# Patient Record
Sex: Female | Born: 1940 | Race: Black or African American | Hispanic: No | Marital: Married | Smoking: Never smoker
Health system: Southern US, Community
[De-identification: ages and names within clinical notes are randomized; demographics above are authoritative.]

## PROBLEM LIST (undated history)

## (undated) DIAGNOSIS — Z9221 Personal history of antineoplastic chemotherapy: Secondary | ICD-10-CM

## (undated) DIAGNOSIS — R51 Headache: Secondary | ICD-10-CM

## (undated) DIAGNOSIS — I5020 Unspecified systolic (congestive) heart failure: Secondary | ICD-10-CM

## (undated) DIAGNOSIS — R591 Generalized enlarged lymph nodes: Secondary | ICD-10-CM

## (undated) DIAGNOSIS — K219 Gastro-esophageal reflux disease without esophagitis: Secondary | ICD-10-CM

## (undated) DIAGNOSIS — M069 Rheumatoid arthritis, unspecified: Secondary | ICD-10-CM

## (undated) DIAGNOSIS — M359 Systemic involvement of connective tissue, unspecified: Secondary | ICD-10-CM

## (undated) DIAGNOSIS — M1990 Unspecified osteoarthritis, unspecified site: Secondary | ICD-10-CM

## (undated) DIAGNOSIS — E785 Hyperlipidemia, unspecified: Secondary | ICD-10-CM

## (undated) DIAGNOSIS — C8310 Mantle cell lymphoma, unspecified site: Secondary | ICD-10-CM

## (undated) HISTORY — DX: Rheumatoid arthritis, unspecified: M06.9

## (undated) HISTORY — DX: Unspecified systolic (congestive) heart failure: I50.20

## (undated) HISTORY — DX: Headache: R51

## (undated) HISTORY — DX: Gastro-esophageal reflux disease without esophagitis: K21.9

## (undated) HISTORY — DX: Personal history of antineoplastic chemotherapy: Z92.21

## (undated) HISTORY — DX: Hyperlipidemia, unspecified: E78.5

## (undated) HISTORY — DX: Unspecified osteoarthritis, unspecified site: M19.90

## (undated) HISTORY — DX: Mantle cell lymphoma, unspecified site: C83.10

## (undated) HISTORY — DX: Generalized enlarged lymph nodes: R59.1

---

## 2004-08-31 HISTORY — PX: CARDIAC CATHETERIZATION: SHX172

## 2004-09-05 ENCOUNTER — Ambulatory Visit: Payer: Self-pay | Admitting: Cardiology

## 2004-09-30 HISTORY — PX: CARDIAC CATHETERIZATION: SHX172

## 2004-10-10 ENCOUNTER — Ambulatory Visit: Payer: Self-pay

## 2004-10-24 ENCOUNTER — Ambulatory Visit: Payer: Self-pay

## 2004-12-03 ENCOUNTER — Ambulatory Visit: Payer: Self-pay

## 2007-11-17 ENCOUNTER — Ambulatory Visit: Payer: Self-pay

## 2008-12-27 ENCOUNTER — Ambulatory Visit: Payer: Self-pay | Admitting: Internal Medicine

## 2009-02-28 ENCOUNTER — Ambulatory Visit: Payer: Self-pay | Admitting: Gastroenterology

## 2009-12-31 ENCOUNTER — Ambulatory Visit: Payer: Self-pay | Admitting: Internal Medicine

## 2010-11-13 LAB — HM COLONOSCOPY: HM Colonoscopy: NORMAL

## 2011-06-02 ENCOUNTER — Other Ambulatory Visit: Payer: Self-pay | Admitting: *Deleted

## 2011-06-02 MED ORDER — FOLIC ACID 1 MG PO TABS: 1.0000 mg | ORAL_TABLET | Freq: Every day | ORAL | Status: AC

## 2011-06-02 NOTE — Telephone Encounter (Signed)
Faxed refill request from [facility] [address], last filled 04/15/11.

## 2011-06-02 NOTE — Telephone Encounter (Signed)
Folic acid called to [facility].

## 2011-08-07 ENCOUNTER — Telehealth: Payer: Self-pay | Admitting: *Deleted

## 2011-08-07 NOTE — Telephone Encounter (Signed)
Pharm faxed RF request for nystatin powder 60 grams use PRN. OK to fill? Last RF was from Dr Darrick Huntsman and this has Dr Tilman Neat name on it... Who's patient is this?

## 2011-08-07 NOTE — Telephone Encounter (Signed)
My patient. Fine to fill.

## 2011-08-08 MED ORDER — NYSTATIN 100000 UNIT/GM EX POWD: Freq: Four times a day (QID) | CUTANEOUS | Status: DC

## 2011-08-08 NOTE — Telephone Encounter (Signed)
Addended by: Vernie Murders on: 08/08/2011 06:00 PM   Modules accepted: Orders

## 2011-08-15 ENCOUNTER — Other Ambulatory Visit: Payer: Self-pay | Admitting: *Deleted

## 2011-08-15 MED ORDER — NYSTATIN 100000 UNIT/GM EX POWD: Freq: Four times a day (QID) | CUTANEOUS | Status: AC

## 2011-09-18 ENCOUNTER — Ambulatory Visit (INDEPENDENT_AMBULATORY_CARE_PROVIDER_SITE_OTHER): Payer: Medicare Other | Admitting: Internal Medicine

## 2011-09-18 ENCOUNTER — Encounter: Payer: Self-pay | Admitting: Internal Medicine

## 2011-09-18 VITALS — BP 110/60 | HR 74 | Temp 98.3°F | Ht 65.75 in | Wt 256.8 lb

## 2011-09-18 DIAGNOSIS — M069 Rheumatoid arthritis, unspecified: Secondary | ICD-10-CM | POA: Insufficient documentation

## 2011-09-18 DIAGNOSIS — H6690 Otitis media, unspecified, unspecified ear: Secondary | ICD-10-CM

## 2011-09-18 DIAGNOSIS — K219 Gastro-esophageal reflux disease without esophagitis: Secondary | ICD-10-CM | POA: Insufficient documentation

## 2011-09-18 DIAGNOSIS — H6692 Otitis media, unspecified, left ear: Secondary | ICD-10-CM | POA: Insufficient documentation

## 2011-09-18 DIAGNOSIS — M810 Age-related osteoporosis without current pathological fracture: Secondary | ICD-10-CM | POA: Insufficient documentation

## 2011-09-18 MED ORDER — CALTRATE 600+D PLUS MINERALS 600-800 MG-UNIT PO CHEW: 1.0000 | CHEWABLE_TABLET | Freq: Two times a day (BID) | ORAL | Status: DC

## 2011-09-18 MED ORDER — AMOXICILLIN-POT CLAVULANATE 875-125 MG PO TABS: 1.0000 | ORAL_TABLET | Freq: Two times a day (BID) | ORAL | Status: AC

## 2011-09-18 NOTE — Assessment & Plan Note (Signed)
Symptoms of dizziness and exam findings of purulent middle ear effusion on the left are most consistent with otitis media. Will treat with Augmentin. Patient will hold her dose of methotrexate while on Augmentin. If no improvement, will set up with ENT for further evaluation.

## 2011-09-18 NOTE — Assessment & Plan Note (Signed)
Symptoms well controlled with use of methotrexate and prednisone. Encouraged her to try to limit use of meloxicam because of ongoing epigastric discomfort. Will get recent notes and labs from her rheumatologist.

## 2011-09-18 NOTE — Patient Instructions (Signed)
Stop Methotrexate x 1 week.  Stop Meloxicam.  Start Augmentin.  Follow up 3 weeks.

## 2011-09-18 NOTE — Assessment & Plan Note (Signed)
Symptoms worsened with the use of prednisone and meloxicam. Encouraged her to use prednisone alone. Will change to omeprazole as she had better luck with this medication the past. If her symptoms persist she will call and we will set up GI evaluation to include endoscopy.

## 2011-09-18 NOTE — Progress Notes (Signed)
Subjective:    Patient ID: Michelle [name], female    DOB: 07/06/1940, 71 y.o.   MRN: [id number]  HPI 71 year old female with history of rheumatoid arthritis presents for followup. She reports that her joint pain has been fairly well controlled with the use of methotrexate. She is also taking prednisone. With the use of these medications, she notes some persistent epigastric discomfort. She has tried taking Dexilant with no improvement. She would like to consider changing back to Prilosec as she had more relief with this medication. She denies any nausea or vomiting. She denies any black stool or blood in her stool. She denies any constipation or diarrhea.  She is concerned today about intermittent episodes of dizziness. The dizziness is described as vertigo. Has been occurring over the last several months. It is precipitated by moving her head to the left. It occurs when she is lying in bed with her head placed towards her left ear. It resolves with lying on her right side. She also notes occasional discomfort in her left ear. She also has some nasal congestion. She denies any fever or chills.  Outpatient Encounter Prescriptions as of 09/18/2011  Medication Sig Dispense Refill  . Calcium Carbonate-Vitamin D (CALTRATE 600+D PO) Take 1 tablet by mouth daily.      [name] dexlansoprazole (DEXILANT) 60 MG capsule Take 60 mg by mouth daily.      . folic acid (FOLVITE) 1 MG tablet Take 1 tablet (1 mg total) by mouth daily.  30 tablet  5  . meloxicam (MOBIC) 15 MG tablet Take 15 mg by mouth every morning.      . Methotrexate Sodium, PF, 50 MG/2ML SOLN Inject 25 mg as directed once a week.      . NON FORMULARY Herbalife-take one tablet every morning and every evening      . nystatin (MYCOSTATIN) powder Apply topically 4 (four) times daily.  60 g  1  . omeprazole (PRILOSEC) 40 MG capsule Take 40 mg by mouth every morning.      . predniSONE (DELTASONE) 10 MG tablet Take 10 mg by mouth every morning.      [name]  amoxicillin-clavulanate (AUGMENTIN) 875-125 MG per tablet Take 1 tablet by mouth 2 (two) times daily.  20 tablet  0  . Calcium Carbonate-Vit D-Min (CALTRATE 600+D PLUS) 600-800 MG-UNIT CHEW Chew 1 tablet by mouth 2 (two) times daily.  60 tablet  6    Review of Systems  Constitutional: Negative for fever, chills, appetite change, fatigue and unexpected weight change.  HENT: Negative for ear pain, congestion, sore throat, trouble swallowing, neck pain, voice change and sinus pressure.   Eyes: Negative for visual disturbance.  Respiratory: Negative for cough, shortness of breath, wheezing and stridor.   Cardiovascular: Negative for chest pain, palpitations and leg swelling.  Gastrointestinal: Positive for abdominal pain. Negative for nausea, vomiting, diarrhea, constipation, blood in stool, abdominal distention and anal bleeding.  Genitourinary: Negative for dysuria and flank pain.  Musculoskeletal: Positive for myalgias, joint swelling and arthralgias. Negative for gait problem.  Skin: Negative for color change and rash.  Neurological: Positive for dizziness. Negative for headaches.  Hematological: Negative for adenopathy. Does not bruise/bleed easily.  Psychiatric/Behavioral: Negative for suicidal ideas, sleep disturbance and dysphoric mood. The patient is not nervous/anxious.       BP 110/60  Pulse 74  Temp(Src) 98.3 F (36.8 C) (Oral)  Ht 5' 5.75" (1.67 m)  Wt 256 lb 12 oz (116.461 kg)  BMI 41.76 kg/m2  SpO2  98%  Objective:   Physical Exam  Constitutional: She is oriented to person, place, and time. She appears well-developed and well-nourished. No distress.  HENT:  Head: Normocephalic and atraumatic.  Right Ear: External ear normal. Tympanic membrane is not bulging. No middle ear effusion.  Left Ear: External ear normal. Tympanic membrane is erythematous and bulging. A middle ear effusion is present.  Nose: Nose normal.  Mouth/Throat: Oropharynx is clear and moist. No  oropharyngeal exudate.  Eyes: Conjunctivae are normal. Pupils are equal, round, and reactive to light. Right eye exhibits no discharge. Left eye exhibits no discharge. No scleral icterus.  Neck: Normal range of motion. Neck supple. No tracheal deviation present. No thyromegaly present.  Cardiovascular: Normal rate, regular rhythm, normal heart sounds and intact distal pulses.  Exam reveals no gallop and no friction rub.   No murmur heard. Pulmonary/Chest: Effort normal and breath sounds normal. No respiratory distress. She has no wheezes. She has no rales. She exhibits no tenderness.  Musculoskeletal: She exhibits no edema and no tenderness.       Right hand: She exhibits decreased range of motion and deformity.       Left hand: She exhibits decreased range of motion and deformity (thickened joints).  Lymphadenopathy:    She has no cervical adenopathy.  Neurological: She is alert and oriented to person, place, and time. No cranial nerve deficit. She exhibits normal muscle tone. Coordination normal.  Skin: Skin is warm and dry. No rash noted. She is not diaphoretic. No erythema. No pallor.  Psychiatric: She has a normal mood and affect. Her behavior is normal. Judgment and thought content normal.          Assessment & Plan:

## 2011-09-26 ENCOUNTER — Encounter: Payer: Self-pay | Admitting: Internal Medicine

## 2011-09-26 LAB — HM MAMMOGRAPHY: HM Mammogram: NORMAL

## 2011-10-13 ENCOUNTER — Telehealth: Payer: Self-pay | Admitting: Internal Medicine

## 2011-10-13 ENCOUNTER — Encounter: Payer: Self-pay | Admitting: Internal Medicine

## 2011-10-13 ENCOUNTER — Ambulatory Visit (INDEPENDENT_AMBULATORY_CARE_PROVIDER_SITE_OTHER): Payer: Medicare Other | Admitting: Internal Medicine

## 2011-10-13 VITALS — BP 107/74 | HR 73 | Temp 98.1°F | Resp 16 | Wt 217.5 lb

## 2011-10-13 DIAGNOSIS — F411 Generalized anxiety disorder: Secondary | ICD-10-CM

## 2011-10-13 DIAGNOSIS — R6889 Other general symptoms and signs: Secondary | ICD-10-CM

## 2011-10-13 DIAGNOSIS — F419 Anxiety disorder, unspecified: Secondary | ICD-10-CM

## 2011-10-13 DIAGNOSIS — R42 Dizziness and giddiness: Secondary | ICD-10-CM | POA: Insufficient documentation
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
## 2011-10-13 NOTE — Assessment & Plan Note (Signed)
Offered support today. Not currently interested in counseling or medication to help with symptoms. Will follow up 3 weeks.
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
## 2011-10-14 LAB — CBC WITH DIFFERENTIAL/PLATELET
Basophils Absolute: 0 10*3/uL (ref 0.0–0.1)
Basophils Relative: 0.1 % (ref 0.0–3.0)
Eosinophils Absolute: 0.1 10*3/uL (ref 0.0–0.7)
Eosinophils Relative: 1 % (ref 0.0–5.0)
HCT: 40.3 % (ref 36.0–46.0)
Hemoglobin: 13.1 g/dL (ref 12.0–15.0)
Lymphocytes Relative: 18.6 % (ref 12.0–46.0)
Lymphs Abs: 1.5 10*3/uL (ref 0.7–4.0)
MCHC: 32.5 g/dL (ref 30.0–36.0)
MCV: 91.7 fl (ref 78.0–100.0)
Monocytes Absolute: 0.2 10*3/uL (ref 0.1–1.0)
Monocytes Relative: 2.2 % — ABNORMAL LOW (ref 3.0–12.0)
Neutro Abs: 6.2 10*3/uL (ref 1.4–7.7)
Neutrophils Relative %: 78.1 % — ABNORMAL HIGH (ref 43.0–77.0)
Platelets: 239 10*3/uL (ref 150.0–400.0)
RBC: 4.39 Mil/uL (ref 3.87–5.11)
RDW: 15.6 % — ABNORMAL HIGH (ref 11.5–14.6)
WBC: 7.9 10*3/uL (ref 4.5–10.5)

## 2011-10-14 LAB — COMPREHENSIVE METABOLIC PANEL
ALT: 12 U/L (ref 0–35)
AST: 26 U/L (ref 0–37)
Albumin: 3.5 g/dL (ref 3.5–5.2)
Alkaline Phosphatase: 55 U/L (ref 39–117)
BUN: 17 mg/dL (ref 6–23)
CO2: 26 mEq/L (ref 19–32)
Calcium: 9.2 mg/dL (ref 8.4–10.5)
Chloride: 105 mEq/L (ref 96–112)
Creatinine, Ser: 0.7 mg/dL (ref 0.4–1.2)
GFR: 105.05 mL/min (ref >60.00)
Glucose, Bld: 125 mg/dL — ABNORMAL HIGH (ref 70–99)
Potassium: 3.9 mEq/L (ref 3.5–5.1)
Sodium: 140 mEq/L (ref 135–145)
Total Bilirubin: 0.3 mg/dL (ref 0.3–1.2)
Total Protein: 7.5 g/dL (ref 6.0–8.3)

## 2011-10-14 LAB — VITAMIN B12: Vitamin B-12: 656 pg/mL (ref 211–911)

## 2011-10-14 LAB — TSH: TSH: 0.67 u[IU]/mL (ref 0.35–5.50)

## 2011-10-15 ENCOUNTER — Encounter: Payer: Self-pay | Admitting: *Deleted

## 2011-10-15 DIAGNOSIS — R739 Hyperglycemia, unspecified: Secondary | ICD-10-CM | POA: Insufficient documentation

## 2011-10-15 LAB — HEMOGLOBIN A1C: Hgb A1c MFr Bld: 6 % (ref 4.6–6.5)

## 2011-10-17 ENCOUNTER — Telehealth: Payer: Self-pay | Admitting: Internal Medicine

## 2011-10-17 DIAGNOSIS — E041 Nontoxic single thyroid nodule: Secondary | ICD-10-CM

## 2011-10-17 NOTE — Telephone Encounter (Signed)
Patient notified. She would like referral to ENT

## 2011-10-17 NOTE — Telephone Encounter (Signed)
Carotid artery ultrasound looked okay, but they saw a thyroid nodule.  I would like to set her up with ENT for further evaluation of the nodule.

## 2011-10-21 NOTE — Telephone Encounter (Signed)
I have filled out my part of referral waiting for Dr. Dan Humphreys to sign.

## 2011-10-22 ENCOUNTER — Encounter: Payer: Self-pay | Admitting: Internal Medicine

## 2011-10-28 ENCOUNTER — Ambulatory Visit: Payer: Self-pay | Admitting: Otolaryngology

## 2011-10-28 NOTE — Telephone Encounter (Signed)
I have faxed order/referral.

## 2011-11-06 ENCOUNTER — Ambulatory Visit: Payer: Medicare Other | Admitting: Internal Medicine

## 2011-11-13 ENCOUNTER — Encounter: Payer: Self-pay | Admitting: Internal Medicine

## 2011-11-13 ENCOUNTER — Ambulatory Visit (INDEPENDENT_AMBULATORY_CARE_PROVIDER_SITE_OTHER): Payer: Medicare Other | Admitting: Internal Medicine

## 2011-11-13 VITALS — BP 130/80 | HR 74 | Temp 98.2°F | Ht 65.75 in | Wt 257.2 lb

## 2011-11-13 DIAGNOSIS — R42 Dizziness and giddiness: Secondary | ICD-10-CM

## 2011-11-13 DIAGNOSIS — E041 Nontoxic single thyroid nodule: Secondary | ICD-10-CM

## 2011-11-13 DIAGNOSIS — M069 Rheumatoid arthritis, unspecified: Secondary | ICD-10-CM

## 2011-11-13 DIAGNOSIS — F419 Anxiety disorder, unspecified: Secondary | ICD-10-CM

## 2011-11-13 DIAGNOSIS — F411 Generalized anxiety disorder: Secondary | ICD-10-CM

## 2011-11-13 MED ORDER — METHOTREXATE SODIUM CHEMO INJECTION (PF) 50 MG/2ML: 25.0000 mg | INTRAMUSCULAR | Status: DC

## 2011-11-13 NOTE — Assessment & Plan Note (Signed)
Symptoms improved. Will continue to monitor 

## 2011-11-13 NOTE — Assessment & Plan Note (Signed)
Patient's rheumatologist is leaving town. Will set up referral to another rheumatologist for evaluation. We'll continue methotrexate, prednisone. Followup here in 3 months.

## 2011-11-13 NOTE — Assessment & Plan Note (Addendum)
Noted on ultrasound of the carotids. Will request notes on evaluation by ENT. Patient notes a plan for a repeat ultrasound in one year.

## 2011-11-13 NOTE — Assessment & Plan Note (Addendum)
Symptoms resolved. Carotid doppler was normal. Suspect his symptoms were related to recent increased anxiety. We'll continue to monitor.

## 2011-11-13 NOTE — Progress Notes (Signed)
Subjective:    Patient ID: Michelle [name], female    DOB: 08/08/1940, 71 y.o.   MRN: [id number]  HPI 71 year old female with history of rheumatoid arthritis, osteoporosis, anxiety presents for followup after recent episodes of dizziness or lightheadedness. She reports that these symptoms have resolved. She notes that her anxiety level is much improved compared to previous. She was evaluated with carotid Dopplers which were normal. She also had evaluation for left-sided thyroid nodule that was incidentally found. She notes that she has follow up or repeat ultrasound with ENT in one year.  In regards to her rheumatoid arthritis, she notes symptoms are fairly well-controlled on current medications. She continues to intermittently have some stiffness and swelling in the joints of her hands. She notes that her rheumatologist is planning to leave the area and she will need to establish care with a new physician.  Outpatient Encounter Prescriptions as of 11/13/2011  Medication Sig Dispense Refill  . Calcium Carbonate-Vit D-Min (CALTRATE 600+D PLUS) 600-800 MG-UNIT CHEW Chew 1 tablet by mouth 2 (two) times daily.  60 tablet  6  . Calcium Carbonate-Vitamin D (CALTRATE 600+D PO) Take 1 tablet by mouth daily.      [name] dexlansoprazole (DEXILANT) 60 MG capsule Take 60 mg by mouth daily.      . folic acid (FOLVITE) 1 MG tablet Take 1 tablet (1 mg total) by mouth daily.  30 tablet  5  . meloxicam (MOBIC) 15 MG tablet Take 15 mg by mouth every morning.      . Methotrexate Sodium, PF, 50 MG/2ML SOLN Inject 25 mg as directed once a week.  40 mL  3  . NON FORMULARY Herbalife-take one tablet every morning and every evening      . nystatin (MYCOSTATIN) powder Apply topically 4 (four) times daily.  60 g  1  . omeprazole (PRILOSEC) 40 MG capsule Take 40 mg by mouth every morning.      . predniSONE (DELTASONE) 10 MG tablet Take 10 mg by mouth every morning.      [name] DISCONTD: Methotrexate Sodium, PF, 50 MG/2ML SOLN Inject  25 mg as directed once a week.        Review of Systems  Constitutional: Negative for fever, chills, appetite change, fatigue and unexpected weight change.  HENT: Negative for neck pain.   Eyes: Negative for visual disturbance.  Respiratory: Negative for cough, shortness of breath, wheezing and stridor.   Cardiovascular: Negative for chest pain, palpitations and leg swelling.  Gastrointestinal: Negative for abdominal pain.  Genitourinary: Negative for dysuria and flank pain.  Musculoskeletal: Positive for joint swelling and arthralgias. Negative for myalgias and gait problem.  Skin: Negative for color change and rash.  Neurological: Negative for dizziness and headaches.  Hematological: Negative for adenopathy. Does not bruise/bleed easily.  Psychiatric/Behavioral: Negative for suicidal ideas, disturbed wake/sleep cycle and dysphoric mood. The patient is not nervous/anxious.    BP 130/80  Pulse 74  Temp 98.2 F (36.8 C) (Oral)  Ht 5' 5.75" (1.67 m)  Wt 257 lb 4 oz (116.688 kg)  BMI 41.84 kg/m2  SpO2 98%     Objective:   Physical Exam  Constitutional: She is oriented to person, place, and time. She appears well-developed and well-nourished. No distress.  HENT:  Head: Normocephalic and atraumatic.  Right Ear: External ear normal.  Left Ear: External ear normal.  Nose: Nose normal.  Mouth/Throat: Oropharynx is clear and moist. No oropharyngeal exudate.  Eyes: Conjunctivae are normal. Pupils are equal, round, and  reactive to light. Right eye exhibits no discharge. Left eye exhibits no discharge. No scleral icterus.  Neck: Normal range of motion. Neck supple. No tracheal deviation present. No thyromegaly present.  Cardiovascular: Normal rate, regular rhythm, normal heart sounds and intact distal pulses.  Exam reveals no gallop and no friction rub.   No murmur heard. Pulmonary/Chest: Effort normal and breath sounds normal. No respiratory distress. She has no wheezes. She has no  rales. She exhibits no tenderness.  Musculoskeletal: Normal range of motion. She exhibits no edema and no tenderness.       Right hand: She exhibits swelling.       Left hand: She exhibits swelling.  Lymphadenopathy:    She has no cervical adenopathy.  Neurological: She is alert and oriented to person, place, and time. No cranial nerve deficit. She exhibits normal muscle tone. Coordination normal.  Skin: Skin is warm and dry. No rash noted. She is not diaphoretic. No erythema. No pallor.  Psychiatric: She has a normal mood and affect. Her behavior is normal. Judgment and thought content normal.          Assessment & Plan:

## 2011-12-17 ENCOUNTER — Other Ambulatory Visit: Payer: Self-pay | Admitting: *Deleted

## 2011-12-17 MED ORDER — PREDNISONE 10 MG PO TABS: 10.0000 mg | ORAL_TABLET | Freq: Every morning | ORAL | Status: DC

## 2011-12-17 NOTE — Telephone Encounter (Signed)
Patient advised via telephone that Rx was sent to pharmacy.

## 2012-02-18 ENCOUNTER — Ambulatory Visit (INDEPENDENT_AMBULATORY_CARE_PROVIDER_SITE_OTHER): Payer: Medicare Other | Admitting: Internal Medicine

## 2012-02-18 ENCOUNTER — Encounter: Payer: Self-pay | Admitting: Internal Medicine

## 2012-02-18 VITALS — BP 110/64 | HR 60 | Temp 98.5°F | Ht 65.75 in | Wt 261.8 lb

## 2012-02-18 DIAGNOSIS — F419 Anxiety disorder, unspecified: Secondary | ICD-10-CM

## 2012-02-18 DIAGNOSIS — M069 Rheumatoid arthritis, unspecified: Secondary | ICD-10-CM

## 2012-02-18 DIAGNOSIS — F411 Generalized anxiety disorder: Secondary | ICD-10-CM

## 2012-02-18 DIAGNOSIS — Z23 Encounter for immunization: Secondary | ICD-10-CM

## 2012-02-18 DIAGNOSIS — R739 Hyperglycemia, unspecified: Secondary | ICD-10-CM

## 2012-02-18 DIAGNOSIS — R7309 Other abnormal glucose: Secondary | ICD-10-CM

## 2012-02-18 MED ORDER — MELOXICAM 15 MG PO TABS: 15.0000 mg | ORAL_TABLET | Freq: Every morning | ORAL | Status: DC

## 2012-02-18 NOTE — Assessment & Plan Note (Signed)
Patient has established care with new rheumatologist. She is being evaluated for treatment with Humira. In the interim, she is continuing methotrexate and prednisone. She plans to taper prednisone to every other day with hopes of tapering off over the next few months. Followup here in 6 months.

## 2012-02-18 NOTE — Assessment & Plan Note (Signed)
Recent fasting blood sugar was elevated but hemoglobin A1c was 6% in range of prediabetes. Will continue to monitor with repeat blood sugar check in 6 months. Encouraged efforts at healthy diet and regular physical activity.

## 2012-02-18 NOTE — Assessment & Plan Note (Signed)
Anxiety is currently well-controlled. Will continue to monitor.

## 2012-02-18 NOTE — Progress Notes (Signed)
Subjective:    Patient ID: Michelle [name], female    DOB: 19-Jan-1941, 71 y.o.   MRN: [id number]  HPI 71 year old female with history of rheumatoid arthritis, obesity, anxiety presents for followup. She reports she is doing well. She establish care with a new rheumatologist and is being evaluated for treatment with Humira. She continues for now on prednisone and methotrexate. Joint pain in her hands is well controlled. She reports some ongoing anxiety related to both of her sons being incarcerated, however she feels that she is dealing with this well. She denies new concerns today. She is trying to increase her physical activity and follow a healthy diet.  Outpatient Encounter Prescriptions as of 02/18/2012  Medication Sig Dispense Refill  . Calcium Carbonate-Vit D-Min (CALTRATE 600+D PLUS) 600-800 MG-UNIT CHEW Chew 1 tablet by mouth 2 (two) times daily.  60 tablet  6  . Calcium Carbonate-Vitamin D (CALTRATE 600+D PO) Take 1 tablet by mouth daily.      . folic acid (FOLVITE) 1 MG tablet Take 1 tablet (1 mg total) by mouth daily.  30 tablet  5  . meloxicam (MOBIC) 15 MG tablet Take 1 tablet (15 mg total) by mouth every morning.  30 tablet  6  . Methotrexate Sodium, PF, 50 MG/2ML SOLN Inject 25 mg as directed once a week.  40 mL  3  . NON FORMULARY Herbalife-take one tablet every morning and every evening      . nystatin (MYCOSTATIN) powder Apply topically 4 (four) times daily.  60 g  1  . omeprazole (PRILOSEC) 40 MG capsule Take 40 mg by mouth every morning.      . predniSONE (DELTASONE) 10 MG tablet Take 1 tablet (10 mg total) by mouth every morning.  30 tablet  6  . DISCONTD: dexlansoprazole (DEXILANT) 60 MG capsule Take 60 mg by mouth daily.      [name] DISCONTD: meloxicam (MOBIC) 15 MG tablet Take 15 mg by mouth every morning.       BP 110/64  Pulse 60  Temp 98.5 F (36.9 C) (Oral)  Ht 5' 5.75" (1.67 m)  Wt 261 lb 12 oz (118.729 kg)  BMI 42.57 kg/m2  SpO2 96%  Review of Systems    Constitutional: Negative for fever, chills, appetite change, fatigue and unexpected weight change.  HENT: Negative for ear pain, congestion, sore throat, trouble swallowing, neck pain, voice change and sinus pressure.   Eyes: Negative for visual disturbance.  Respiratory: Negative for cough, shortness of breath, wheezing and stridor.   Cardiovascular: Negative for chest pain, palpitations and leg swelling.  Gastrointestinal: Negative for nausea, vomiting, abdominal pain, diarrhea, constipation, blood in stool, abdominal distention and anal bleeding.  Genitourinary: Negative for dysuria and flank pain.  Musculoskeletal: Positive for arthralgias. Negative for myalgias and gait problem.  Skin: Negative for color change and rash.  Neurological: Negative for dizziness and headaches.  Hematological: Negative for adenopathy. Does not bruise/bleed easily.  Psychiatric/Behavioral: Negative for suicidal ideas, disturbed wake/sleep cycle and dysphoric mood. The patient is nervous/anxious.        Objective:   Physical Exam  Constitutional: She is oriented to person, place, and time. She appears well-developed and well-nourished. No distress.  HENT:  Head: Normocephalic and atraumatic.  Right Ear: External ear normal.  Left Ear: External ear normal.  Nose: Nose normal.  Mouth/Throat: Oropharynx is clear and moist. No oropharyngeal exudate.  Eyes: Conjunctivae normal are normal. Pupils are equal, round, and reactive to light. Right eye exhibits no discharge.  Left eye exhibits no discharge. No scleral icterus.  Neck: Normal range of motion. Neck supple. No tracheal deviation present. No thyromegaly present.  Cardiovascular: Normal rate, regular rhythm, normal heart sounds and intact distal pulses.  Exam reveals no gallop and no friction rub.   No murmur heard. Pulmonary/Chest: Effort normal and breath sounds normal. No respiratory distress. She has no wheezes. She has no rales. She exhibits no  tenderness.  Musculoskeletal: Normal range of motion. She exhibits no edema and no tenderness.  Lymphadenopathy:    She has no cervical adenopathy.  Neurological: She is alert and oriented to person, place, and time. No cranial nerve deficit. She exhibits normal muscle tone. Coordination normal.  Skin: Skin is warm and dry. No rash noted. She is not diaphoretic. No erythema. No pallor.  Psychiatric: She has a normal mood and affect. Her behavior is normal. Judgment and thought content normal.          Assessment & Plan:

## 2012-07-02 ENCOUNTER — Other Ambulatory Visit: Payer: Self-pay | Admitting: Internal Medicine

## 2012-07-16 ENCOUNTER — Other Ambulatory Visit: Payer: Self-pay | Admitting: Internal Medicine

## 2012-07-17 NOTE — Telephone Encounter (Signed)
Ok to fill 

## 2012-08-19 ENCOUNTER — Encounter: Payer: Self-pay | Admitting: Internal Medicine

## 2012-08-19 ENCOUNTER — Ambulatory Visit (INDEPENDENT_AMBULATORY_CARE_PROVIDER_SITE_OTHER): Payer: Medicare Other | Admitting: Internal Medicine

## 2012-08-19 VITALS — BP 118/80 | HR 75 | Temp 98.2°F | Ht 65.75 in | Wt 250.0 lb

## 2012-08-19 DIAGNOSIS — Z0000 Encounter for general adult medical examination without abnormal findings: Secondary | ICD-10-CM | POA: Insufficient documentation

## 2012-08-19 LAB — HM PAP SMEAR

## 2012-08-19 NOTE — Progress Notes (Signed)
Subjective:    Patient ID: Michelle [name], female    DOB: April 19, 1941, 72 y.o.   MRN: [id number]  HPI The patient is here for annual Medicare wellness examination and management of other chronic and acute problems.   The risk factors are reflected in the social history.  The roster of all physicians providing medical care to patient - is listed in the Snapshot section of the chart.  Activities of daily living:  The patient is 100% independent in all ADLs: dressing, toileting, feeding as well as independent mobility  Home safety : The patient has smoke detectors in the home. They wear seatbelts.  There are no firearms at home. There is no violence in the home.   There is no risks for hepatitis, STDs or HIV. There is no history of blood transfusion. They have no travel history to infectious disease endemic areas of the world.  The patient has not seen their dentist in the last six month.  They have seen their eye doctor in the last year. Needs new referral. They have deferred audiologic testing in the last year. Able to hear whisper.  They do not  have excessive sun exposure. Discussed the need for sun protection: hats, long sleeves and use of sunscreen if there is significant sun exposure.   Diet: the importance of a healthy diet is discussed. They do have a healthy diet.  The benefits of regular aerobic exercise were discussed. She joined the [employer], a gym for exercise.  Depression screen: there are no signs or vegative symptoms of depression- irritability, change in appetite, anhedonia, sadness/tearfullness.  Cognitive assessment: the patient manages all their financial and personal affairs and is actively engaged. They could relate day,date,year and events.  The following portions of the patient's history were reviewed and updated as appropriate: allergies, current medications, past family history, past medical history,  past surgical history, past social history  and problem  list.  Visual acuity was not assessed per patient preference since she has regular follow up with her ophthalmologist. Hearing and body mass index were assessed and reviewed.   During the course of the visit the patient was educated and counseled about appropriate screening and preventive services including : fall prevention , diabetes screening, nutrition counseling, colorectal cancer screening, and recommended immunizations.     Outpatient Encounter Prescriptions as of 08/19/2012  Medication Sig Dispense Refill  . Calcium Carbonate-Vit D-Min (CALTRATE 600+D PLUS) 600-800 MG-UNIT CHEW Chew 1 tablet by mouth 2 (two) times daily.  60 tablet  6  . folic acid (FOLVITE) 1 MG tablet TAKE 1 TABLET EVERY DAY  30 tablet  5  . hydroxychloroquine (PLAQUENIL) 200 MG tablet       . meloxicam (MOBIC) 15 MG tablet Take 1 tablet (15 mg total) by mouth every morning.  30 tablet  6  . Methotrexate Sodium, PF, 50 MG/2ML SOLN Inject 25 mg as directed once a week.  40 mL  3  . NON FORMULARY Herbalife-take one tablet every morning and every evening      . omeprazole (PRILOSEC) 40 MG capsule Take 40 mg by mouth every morning.      . [DISCONTINUED] predniSONE (DELTASONE) 10 MG tablet TAKE 1 TABLET EVERY MORNING  30 tablet  6  . [DISCONTINUED] predniSONE (DELTASONE) 10 MG tablet       . predniSONE (DELTASONE) 10 MG tablet TAKE 1 TABLET EVERY MORNING  30 tablet  6  . [DISCONTINUED] Calcium Carbonate-Vitamin D (CALTRATE 600+D PO) Take 1 tablet by  mouth daily.       No facility-administered encounter medications on file as of 08/19/2012.   BP 118/80  Pulse 75  Temp(Src) 98.2 F (36.8 C) (Oral)  Ht 5' 5.75" (1.67 m)  Wt 250 lb (113.399 kg)  BMI 40.66 kg/m2  SpO2 98%  Review of Systems  Constitutional: Negative for fever, chills, appetite change, fatigue and unexpected weight change.  HENT: Negative for ear pain, congestion, sore throat, trouble swallowing, neck pain, voice change and sinus pressure.   Eyes:  Negative for visual disturbance.  Respiratory: Negative for cough, shortness of breath, wheezing and stridor.   Cardiovascular: Negative for chest pain, palpitations and leg swelling.  Gastrointestinal: Negative for nausea, vomiting, abdominal pain, diarrhea, constipation, blood in stool, abdominal distention and anal bleeding.  Genitourinary: Negative for dysuria and flank pain.  Musculoskeletal: Positive for myalgias and arthralgias. Negative for gait problem.  Skin: Negative for color change and rash.  Neurological: Negative for dizziness and headaches.  Hematological: Negative for adenopathy. Does not bruise/bleed easily.  Psychiatric/Behavioral: Negative for suicidal ideas, sleep disturbance and dysphoric mood. The patient is not nervous/anxious.        Objective:   Physical Exam  Constitutional: She is oriented to person, place, and time. She appears well-developed and well-nourished. No distress.  HENT:  Head: Normocephalic and atraumatic.  Right Ear: External ear normal.  Left Ear: External ear normal.  Nose: Nose normal.  Mouth/Throat: Oropharynx is clear and moist. No oropharyngeal exudate.  Eyes: Conjunctivae are normal. Pupils are equal, round, and reactive to light. Right eye exhibits no discharge. Left eye exhibits no discharge. No scleral icterus.  Neck: Normal range of motion. Neck supple. No tracheal deviation present. No thyromegaly present.  Cardiovascular: Normal rate, regular rhythm, normal heart sounds and intact distal pulses.  Exam reveals no gallop and no friction rub.   No murmur heard. Pulmonary/Chest: Effort normal and breath sounds normal. No accessory muscle usage. Not tachypneic. No respiratory distress. She has no decreased breath sounds. She has no wheezes. She has no rhonchi. She has no rales. She exhibits no tenderness. Right breast exhibits no inverted nipple, no mass, no nipple discharge, no skin change and no tenderness. Left breast exhibits no inverted  nipple, no mass, no nipple discharge, no skin change and no tenderness. Breasts are symmetrical.  Abdominal: Soft. Bowel sounds are normal. She exhibits no distension. There is no tenderness. There is no rebound and no guarding.  Musculoskeletal: Normal range of motion. She exhibits no edema and no tenderness.  Lymphadenopathy:    She has no cervical adenopathy.  Neurological: She is alert and oriented to person, place, and time. No cranial nerve deficit. She exhibits normal muscle tone. Coordination normal.  Skin: Skin is warm and dry. No rash noted. She is not diaphoretic. No erythema. No pallor.  Psychiatric: She has a normal mood and affect. Her behavior is normal. Judgment and thought content normal.          Assessment & Plan:

## 2012-08-19 NOTE — Assessment & Plan Note (Signed)
General medical exam normal today including breast exam. PAP and pelvic exam deferred given pt preference and age, previous PAPs normal. Will request labs from Dr. Gavin Potters.  Encouraged healthy diet and regular physical activity with goal of weight loss. Mammogram will be scheduled in 08/2012. Colonoscopy UTD.

## 2012-08-24 ENCOUNTER — Telehealth: Payer: Self-pay | Admitting: *Deleted

## 2012-08-24 ENCOUNTER — Encounter: Payer: Self-pay | Admitting: Emergency Medicine

## 2012-08-24 DIAGNOSIS — I447 Left bundle-branch block, unspecified: Secondary | ICD-10-CM

## 2012-08-24 NOTE — Telephone Encounter (Signed)
Message copied by Theola Sequin on Tue Aug 24, 2012  9:33 AM ------      Message from: Ronna Polio A      Created: Thu Aug 19, 2012 11:20 AM       Her EKG showed a left bundle branch block which is a conduction abnormality. This is a new finding according to our records. I would like to set her up with cardiology for further evaluation, likely to include a stress test.  Please make sure this is okay with her and we can schedule. ------

## 2012-08-24 NOTE — Telephone Encounter (Signed)
Patient is ok with referral to cardiology.

## 2012-09-02 ENCOUNTER — Encounter: Payer: Self-pay | Admitting: *Deleted

## 2012-09-05 ENCOUNTER — Other Ambulatory Visit: Payer: Self-pay | Admitting: Internal Medicine

## 2012-09-06 NOTE — Telephone Encounter (Signed)
Informed patient she can not take Meloxicam while taking Methotrexate

## 2012-09-06 NOTE — Telephone Encounter (Signed)
Please let pt know that she should not take Meloxicam while she is taking Methotrexate.

## 2012-09-10 ENCOUNTER — Encounter: Payer: Self-pay | Admitting: Cardiovascular Disease

## 2012-09-10 ENCOUNTER — Ambulatory Visit (INDEPENDENT_AMBULATORY_CARE_PROVIDER_SITE_OTHER): Payer: Medicare Other | Admitting: Cardiovascular Disease

## 2012-09-10 VITALS — BP 124/78 | HR 72 | Ht 67.0 in | Wt 265.2 lb

## 2012-09-10 DIAGNOSIS — I447 Left bundle-branch block, unspecified: Secondary | ICD-10-CM | POA: Insufficient documentation

## 2012-09-10 DIAGNOSIS — Z0000 Encounter for general adult medical examination without abnormal findings: Secondary | ICD-10-CM

## 2012-09-10 DIAGNOSIS — R0789 Other chest pain: Secondary | ICD-10-CM

## 2012-09-10 NOTE — Assessment & Plan Note (Addendum)
Overall she is doing very well. Lab work was reviewed, old records evaluated. No diabetes, blood pressure well controlled, she reports no issues with her cholesterol. We have encouraged her to continue her diet and exercise.of note she does have a thyroid nodule seen on old ultrasound in 2013.

## 2012-09-10 NOTE — Progress Notes (Signed)
Patient ID: Michelle [name], female    DOB: [birth year], 72 y.o.   MRN: [id number]  HPI Comments: Michelle [name] is a very pleasant 72 year old woman with mild obesity, prior history of chest pain with cardiac catheterization in 2006 at [facility] showing no significant CAD, normal ejection fraction estimated greater than 60%, thyroid nodule seen on ultrasound May 2013, prior carotid ultrasound showing no significant disease, who presents for new patient evaluation for abnormal EKG.  She reports that overall she feels well. She denies any significant symptoms of shortness of breath or chest pain. She is working out at [employer] several days per week. She does light weights, mild to moderate aerobic activity. No symptoms of chest pain, shortness of breath, lightheadedness when she exercises. Overall she says that she is doing well with no complaints.   She does report having occasional edema in her legs.she is interested in a diuretic pill for occasional use.   [id number] shows normal sinus rhythm with rate 72 beats a minute, left bundle branch block EKG one month ago again shows left bundle branch block  EKGs obtained from [facility] in March 24 and August 24 2001 showed normal sinus rhythm with left bundle branch block     Outpatient Encounter Prescriptions as of 09/10/2012  Medication Sig Dispense Refill  . Calcium Carbonate-Vit D-Min (CALTRATE 600+D PLUS) 600-800 MG-UNIT CHEW Chew 1 tablet by mouth 2 (two) times daily.  60 tablet  6  . folic acid (FOLVITE) 1 MG tablet TAKE 1 TABLET EVERY DAY  30 tablet  5  . hydroxychloroquine (PLAQUENIL) 200 MG tablet once a week.       . Methotrexate Sodium, PF, 50 MG/2ML SOLN Inject 25 mg as directed once a week.  40 mL  3  . NON FORMULARY Herbalife-take one tablet every morning and every evening      . omeprazole (PRILOSEC) 40 MG capsule Take 40 mg by mouth every morning.      . predniSONE (DELTASONE) 5 MG tablet Take 5 mg by mouth daily.        Review of Systems   Constitutional: Negative.   HENT: Negative.   Eyes: Negative.   Respiratory: Negative.   Cardiovascular: Positive for leg swelling.  Gastrointestinal: Negative.   Musculoskeletal: Negative.   Skin: Negative.   Neurological: Negative.   Psychiatric/Behavioral: Negative.   All other systems reviewed and are negative.    BP 124/78  Pulse 72  Ht 5\' 7"  (1.702 m)  Wt 265 lb 4 oz (120.317 kg)  BMI 41.53 kg/m2  Physical Exam  Nursing note and vitals reviewed. Constitutional: She is oriented to person, place, and time. She appears well-developed and well-nourished.  HENT:  Head: Normocephalic.  Nose: Nose normal.  Mouth/Throat: Oropharynx is clear and moist.  Eyes: Conjunctivae are normal. Pupils are equal, round, and reactive to light.  Neck: Normal range of motion. Neck supple. No JVD present.  Cardiovascular: Normal rate, regular rhythm, S1 normal, S2 normal, normal heart sounds and intact distal pulses.  Exam reveals no gallop and no friction rub.   No murmur heard. Trace pitting edema above the sock line worse on the left than the right  Pulmonary/Chest: Effort normal and breath sounds normal. No respiratory distress. She has no wheezes. She has no rales. She exhibits no tenderness.  Abdominal: Soft. Bowel sounds are normal. She exhibits no distension. There is no tenderness.  Musculoskeletal: Normal range of motion. She exhibits edema. She exhibits no tenderness.  Lymphadenopathy:  She has no cervical adenopathy.  Neurological: She is alert and oriented to person, place, and time. Coordination normal.  Skin: Skin is warm and dry. No rash noted. No erythema.  Psychiatric: She has a normal mood and affect. Her behavior is normal. Judgment and thought content normal.    Assessment and Plan

## 2012-09-10 NOTE — Patient Instructions (Addendum)
You are doing well. No medication changes were made.  Please call us if you have new issues that need to be addressed before your next appt.    

## 2012-09-10 NOTE — Assessment & Plan Note (Addendum)
We spent some time in the office tracking down her old EKGs. She was reportedly seen at [facility] in the past, prior cardiac catheterization and prior admission/evaluation at [facility] in 2003. She has chronic left bundle branch block seen within 10 years ago (2003). Given her normal cardiac catheterization in 2006, and the fact that she's not having any symptoms at this time with exertion and exercising at the gym, we have not ordered a stress test. We have discussed symptoms that would concern [place] from a cardiac perspective such as worsening shortness of breath, chest tightness, lightheadedness. She will contact our office if she has any the symptoms.

## 2012-09-14 ENCOUNTER — Ambulatory Visit: Payer: Medicare Other | Admitting: Cardiovascular Disease

## 2012-10-04 ENCOUNTER — Other Ambulatory Visit: Payer: Self-pay | Admitting: Internal Medicine

## 2012-10-04 NOTE — Telephone Encounter (Signed)
Rx sent to pharmacy by escript  

## 2012-11-03 ENCOUNTER — Other Ambulatory Visit: Payer: Self-pay | Admitting: Internal Medicine

## 2013-02-23 ENCOUNTER — Ambulatory Visit: Payer: Medicare Other | Admitting: Internal Medicine

## 2013-03-01 ENCOUNTER — Encounter: Payer: Self-pay | Admitting: Internal Medicine

## 2013-03-01 ENCOUNTER — Ambulatory Visit (INDEPENDENT_AMBULATORY_CARE_PROVIDER_SITE_OTHER): Payer: Medicare Other | Admitting: Internal Medicine

## 2013-03-01 VITALS — BP 114/70 | HR 68 | Temp 98.2°F | Wt 256.0 lb

## 2013-03-01 DIAGNOSIS — H6690 Otitis media, unspecified, unspecified ear: Secondary | ICD-10-CM

## 2013-03-01 DIAGNOSIS — H6691 Otitis media, unspecified, right ear: Secondary | ICD-10-CM | POA: Insufficient documentation

## 2013-03-01 DIAGNOSIS — F419 Anxiety disorder, unspecified: Secondary | ICD-10-CM

## 2013-03-01 DIAGNOSIS — F411 Generalized anxiety disorder: Secondary | ICD-10-CM

## 2013-03-01 DIAGNOSIS — M069 Rheumatoid arthritis, unspecified: Secondary | ICD-10-CM

## 2013-03-01 MED ORDER — AMOXICILLIN-POT CLAVULANATE 875-125 MG PO TABS: 1.0000 | ORAL_TABLET | Freq: Two times a day (BID) | ORAL | Status: DC

## 2013-03-01 NOTE — Assessment & Plan Note (Signed)
Symptoms well controlled with use of prednisone, methotrexate, and Plaquenil. Will request recent labs from her rheumatologist.

## 2013-03-01 NOTE — Progress Notes (Signed)
Subjective:    Patient ID: Michelle [name], female    DOB: April 02, 1941, 72 y.o.   MRN: [id number]  HPI 72 year old female with history of rheumatoid arthritis increase in anxiety related to incarceration of her children presents for followup. She reports that she is generally doing well. Things are going well with her children and she has been handling anxiety without medication. In regards to rheumatoid arthritis, symptoms have been well-controlled with methotrexate, Plaquenil, and prednisone. She recently had labs performed by her rheumatologist. She is unsure of results.  She is concerned today about 1-1/2 week history of nasal congestion, cough productive of purulent sputum, and right ear pain. She initially had fevers but these seem to have subsided. She denies shortness of breath or chest pain. She is not currently taking any medication for her symptoms.  Outpatient Encounter Prescriptions as of 03/01/2013  Medication Sig Dispense Refill  . Calcium Carbonate-Vit D-Min (CALTRATE 600+D PLUS) 600-800 MG-UNIT CHEW Chew 1 tablet by mouth 2 (two) times daily.  60 tablet  6  . folic acid (FOLVITE) 1 MG tablet TAKE 1 TABLET EVERY DAY  30 tablet  5  . hydroxychloroquine (PLAQUENIL) 200 MG tablet Take 200 mg by mouth daily.       . Methotrexate Sodium, PF, 50 MG/2ML SOLN Inject 25 mg as directed once a week.  40 mL  3  . NON FORMULARY Herbalife-take one tablet every morning and every evening      . omeprazole (PRILOSEC) 40 MG capsule TAKE ONE CAPSULE EVERY DAY  30 capsule  5  . predniSONE (DELTASONE) 5 MG tablet Take 4 mg by mouth daily.       . [DISCONTINUED] CALTRATE 600+D 600-400 MG-UNIT per chew tablet TAKE 1 TABLET TWICE A DAY  60 tablet  5  . amoxicillin-clavulanate (AUGMENTIN) 875-125 MG per tablet Take 1 tablet by mouth 2 (two) times daily.  20 tablet  0   No facility-administered encounter medications on file as of 03/01/2013.   BP 114/70  Pulse 68  Temp(Src) 98.2 F (36.8 C) (Oral)  Wt  256 lb (116.121 kg)  BMI 40.09 kg/m2  SpO2 98%  Review of Systems  Constitutional: Positive for fever. Negative for chills, appetite change, fatigue and unexpected weight change.  HENT: Positive for ear pain, congestion, rhinorrhea and postnasal drip. Negative for sore throat, trouble swallowing, neck pain, voice change and sinus pressure.   Eyes: Negative for visual disturbance.  Respiratory: Positive for cough. Negative for shortness of breath, wheezing and stridor.   Cardiovascular: Negative for chest pain, palpitations and leg swelling.  Gastrointestinal: Negative for nausea, vomiting, abdominal pain, diarrhea, constipation, blood in stool, abdominal distention and anal bleeding.  Genitourinary: Negative for dysuria and flank pain.  Musculoskeletal: Positive for arthralgias. Negative for myalgias and gait problem.  Skin: Negative for color change and rash.  Neurological: Negative for dizziness and headaches.  Hematological: Negative for adenopathy. Does not bruise/bleed easily.  Psychiatric/Behavioral: Negative for suicidal ideas, sleep disturbance and dysphoric mood. The patient is not nervous/anxious.        Objective:   Physical Exam  Constitutional: She is oriented to person, place, and time. She appears well-developed and well-nourished. No distress.  HENT:  Head: Normocephalic and atraumatic.  Right Ear: External ear normal. Tympanic membrane is erythematous and bulging. A middle ear effusion is present.  Left Ear: External ear normal. Tympanic membrane is not erythematous and not bulging.  No middle ear effusion.  Nose: Nose normal.  Mouth/Throat: Posterior oropharyngeal erythema  present. No oropharyngeal exudate.  Eyes: Conjunctivae are normal. Pupils are equal, round, and reactive to light. Right eye exhibits no discharge. Left eye exhibits no discharge. No scleral icterus.  Neck: Normal range of motion. Neck supple. No tracheal deviation present. No thyromegaly present.   Cardiovascular: Normal rate, regular rhythm, normal heart sounds and intact distal pulses.  Exam reveals no gallop and no friction rub.   No murmur heard. Pulmonary/Chest: Effort normal. No accessory muscle usage. Not tachypneic. No respiratory distress. She has no decreased breath sounds. She has no wheezes. She has rhonchi (few scattered). She has no rales. She exhibits no tenderness.  Musculoskeletal: Normal range of motion. She exhibits no edema and no tenderness.  Lymphadenopathy:    She has no cervical adenopathy.  Neurological: She is alert and oriented to person, place, and time. No cranial nerve deficit. She exhibits normal muscle tone. Coordination normal.  Skin: Skin is warm and dry. No rash noted. She is not diaphoretic. No erythema. No pallor.  Psychiatric: She has a normal mood and affect. Her behavior is normal. Judgment and thought content normal.          Assessment & Plan:

## 2013-03-01 NOTE — Patient Instructions (Signed)
Start Augmentin for ear infection.  STOP Methotrexate this week and next week.  Follow up in 3 weeks to recheck ear or sooner if any questions or concerns.

## 2013-03-01 NOTE — Assessment & Plan Note (Signed)
Symptoms currently well-controlled without medication. Will monitor.

## 2013-03-01 NOTE — Assessment & Plan Note (Signed)
Symptoms and exam are consistent with right otitis media. Will treat with Augmentin. Patient will use ibuprofen or Tylenol as needed for fever or pain. Patient will call if symptoms are not improving over the next several days.

## 2013-04-01 ENCOUNTER — Encounter (INDEPENDENT_AMBULATORY_CARE_PROVIDER_SITE_OTHER): Payer: Self-pay

## 2013-04-01 ENCOUNTER — Encounter: Payer: Self-pay | Admitting: Internal Medicine

## 2013-04-01 ENCOUNTER — Ambulatory Visit (INDEPENDENT_AMBULATORY_CARE_PROVIDER_SITE_OTHER): Payer: Medicare Other | Admitting: Internal Medicine

## 2013-04-01 VITALS — BP 120/72 | HR 68 | Temp 98.0°F | Wt 257.0 lb

## 2013-04-01 DIAGNOSIS — H6690 Otitis media, unspecified, unspecified ear: Secondary | ICD-10-CM

## 2013-04-01 DIAGNOSIS — M069 Rheumatoid arthritis, unspecified: Secondary | ICD-10-CM

## 2013-04-01 DIAGNOSIS — Z23 Encounter for immunization: Secondary | ICD-10-CM

## 2013-04-01 DIAGNOSIS — E785 Hyperlipidemia, unspecified: Secondary | ICD-10-CM

## 2013-04-01 DIAGNOSIS — H6691 Otitis media, unspecified, right ear: Secondary | ICD-10-CM

## 2013-04-01 LAB — LIPID PANEL
Cholesterol: 154 mg/dL (ref 0–200)
HDL: 58 mg/dL (ref >39)
LDL Cholesterol: 83 mg/dL (ref 0–99)
Total CHOL/HDL Ratio: 2.7 Ratio
Triglycerides: 64 mg/dL (ref <150)
VLDL: 13 mg/dL (ref 0–40)

## 2013-04-01 LAB — CBC WITH DIFFERENTIAL/PLATELET
Basophils Absolute: 0 10*3/uL (ref 0.0–0.1)
Basophils Relative: 1 % (ref 0–1)
Eosinophils Absolute: 0.2 10*3/uL (ref 0.0–0.7)
Eosinophils Relative: 3 % (ref 0–5)
HCT: 39.9 % (ref 36.0–46.0)
Hemoglobin: 13.3 g/dL (ref 12.0–15.0)
Lymphocytes Relative: 27 % (ref 12–46)
Lymphs Abs: 1.5 10*3/uL (ref 0.7–4.0)
MCH: 29.5 pg (ref 26.0–34.0)
MCHC: 33.3 g/dL (ref 30.0–36.0)
MCV: 88.5 fL (ref 78.0–100.0)
Monocytes Absolute: 0.4 10*3/uL (ref 0.1–1.0)
Monocytes Relative: 6 % (ref 3–12)
Neutro Abs: 3.5 10*3/uL (ref 1.7–7.7)
Neutrophils Relative %: 63 % (ref 43–77)
Platelets: 231 10*3/uL (ref 150–400)
RBC: 4.51 MIL/uL (ref 3.87–5.11)
RDW: 14.7 % (ref 11.5–15.5)
WBC: 5.5 10*3/uL (ref 4.0–10.5)

## 2013-04-01 LAB — COMPREHENSIVE METABOLIC PANEL
ALT: 11 U/L (ref 0–35)
AST: 18 U/L (ref 0–37)
Albumin: 3.6 g/dL (ref 3.5–5.2)
Alkaline Phosphatase: 60 U/L (ref 39–117)
BUN: 18 mg/dL (ref 6–23)
CO2: 31 mEq/L (ref 19–32)
Calcium: 9.2 mg/dL (ref 8.4–10.5)
Chloride: 106 mEq/L (ref 96–112)
Creat: 0.68 mg/dL (ref 0.50–1.10)
Glucose, Bld: 84 mg/dL (ref 70–99)
Potassium: 4.2 mEq/L (ref 3.5–5.3)
Sodium: 142 mEq/L (ref 135–145)
Total Bilirubin: 0.4 mg/dL (ref 0.3–1.2)
Total Protein: 6.5 g/dL (ref 6.0–8.3)

## 2013-04-02 NOTE — Progress Notes (Signed)
Subjective:    Patient ID: Michelle [name], female    DOB: [birth year], 72 y.o.   MRN: [id number]  HPI 72YO female with h/o RA presents for follow up after recent right otitis media. She is feeling well. No further right ear pain. No fever, chills. She has not yet resumed taking Methotrexate. She has started on Plaquenil and reports significant improvement in joint pain with this. She has been gradually tapering down on Prednisone.  She has several concerns today about which vitamins she should be taking.  Outpatient Encounter Prescriptions as of 04/01/2013  Medication Sig  . Calcium Carbonate-Vit D-Min (CALTRATE 600+D PLUS) 600-800 MG-UNIT CHEW Chew 1 tablet by mouth 2 (two) times daily.  . folic acid (FOLVITE) 1 MG tablet TAKE 1 TABLET EVERY DAY  . hydroxychloroquine (PLAQUENIL) 200 MG tablet Take 200 mg by mouth daily.   . Methotrexate Sodium, PF, 50 MG/2ML SOLN Inject 25 mg as directed once a week.  . Multiple Vitamins-Minerals (MULTIVITAMIN GUMMIES ADULT) CHEW Chew by mouth.  . NON FORMULARY Herbalife-take one tablet every morning and every evening  . omeprazole (PRILOSEC) 40 MG capsule TAKE ONE CAPSULE EVERY DAY  . predniSONE (DELTASONE) 5 MG tablet Take 4 mg by mouth daily.   . vitamin E 400 UNIT capsule Take 400 Units by mouth daily.  . [DISCONTINUED] amoxicillin-clavulanate (AUGMENTIN) 875-125 MG per tablet Take 1 tablet by mouth 2 (two) times daily.   BP 120/72  Pulse 68  Temp(Src) 98 F (36.7 C) (Oral)  Wt 257 lb (116.574 kg)  BMI 40.24 kg/m2  SpO2 96%  Review of Systems  Constitutional: Negative for fever, chills, appetite change, fatigue and unexpected weight change.  HENT: Negative for congestion, ear pain, sinus pressure, sore throat, trouble swallowing and voice change.   Eyes: Negative for visual disturbance.  Respiratory: Negative for cough, shortness of breath, wheezing and stridor.   Cardiovascular: Negative for chest pain, palpitations and leg swelling.   Gastrointestinal: Negative for nausea, vomiting, abdominal pain, diarrhea, constipation, blood in stool, abdominal distention and anal bleeding.  Genitourinary: Negative for dysuria and flank pain.  Musculoskeletal: Negative for arthralgias, gait problem, myalgias and neck pain.  Skin: Negative for color change and rash.  Neurological: Negative for dizziness and headaches.  Hematological: Negative for adenopathy. Does not bruise/bleed easily.  Psychiatric/Behavioral: Negative for suicidal ideas, sleep disturbance and dysphoric mood. The patient is not nervous/anxious.        Objective:   Physical Exam  Constitutional: She is oriented to person, place, and time. She appears well-developed and well-nourished. No distress.  HENT:  Head: Normocephalic and atraumatic.  Right Ear: External ear normal.  Left Ear: External ear normal.  Nose: Nose normal.  Mouth/Throat: Oropharynx is clear and moist. No oropharyngeal exudate.  Eyes: Conjunctivae are normal. Pupils are equal, round, and reactive to light. Right eye exhibits no discharge. Left eye exhibits no discharge. No scleral icterus.  Neck: Normal range of motion. Neck supple. No tracheal deviation present. No thyromegaly present.  Cardiovascular: Normal rate, regular rhythm, normal heart sounds and intact distal pulses.  Exam reveals no gallop and no friction rub.   No murmur heard. Pulmonary/Chest: Effort normal and breath sounds normal. No accessory muscle usage. Not tachypneic. No respiratory distress. She has no decreased breath sounds. She has no wheezes. She has no rhonchi. She has no rales. She exhibits no tenderness.  Musculoskeletal: Normal range of motion. She exhibits no edema and no tenderness.       Right hand: She  exhibits deformity (thickened PIP joints).       Left hand: She exhibits deformity (thickened PIP joints ). Normal sensation noted. Normal strength noted.  Lymphadenopathy:    She has no cervical adenopathy.   Neurological: She is alert and oriented to person, place, and time. No cranial nerve deficit. She exhibits normal muscle tone. Coordination normal.  Skin: Skin is warm and dry. No rash noted. She is not diaphoretic. No erythema. No pallor.  Psychiatric: She has a normal mood and affect. Her behavior is normal. Judgment and thought content normal.          Assessment & Plan:

## 2013-04-02 NOTE — Assessment & Plan Note (Signed)
Symptoms resolved. Exam normal today. Follow up prn.

## 2013-04-02 NOTE — Assessment & Plan Note (Addendum)
Symptoms of joint pain recently well controlled. Encouraged pt to resume Methotrexate as directed. Follow up with Dr. Gavin Potters as scheduled. Reviewed all medications and vitamin supplements with pt today. Will stop Vit E and Calcium supplement.  Over of which >50% spent in face-to-face contact with patient discussing plan of care

## 2013-04-04 ENCOUNTER — Encounter: Payer: Self-pay | Admitting: *Deleted

## 2013-09-14 ENCOUNTER — Emergency Department: Payer: Self-pay | Admitting: Emergency Medicine

## 2013-09-14 ENCOUNTER — Telehealth: Payer: Self-pay | Admitting: Internal Medicine

## 2013-09-14 LAB — CBC
HCT: 41 % (ref 35.0–47.0)
HGB: 13.4 g/dL (ref 12.0–16.0)
MCH: 28.9 pg (ref 26.0–34.0)
MCHC: 32.6 g/dL (ref 32.0–36.0)
MCV: 89 fL (ref 80–100)
Platelet: 185 10*3/uL (ref 150–440)
RBC: 4.63 10*6/uL (ref 3.80–5.20)
RDW: 15.1 % — ABNORMAL HIGH (ref 11.5–14.5)
WBC: 6.4 10*3/uL (ref 3.6–11.0)

## 2013-09-14 LAB — LIPASE, BLOOD: Lipase: 213 U/L (ref 73–393)

## 2013-09-14 LAB — COMPREHENSIVE METABOLIC PANEL
Albumin: 3.2 g/dL — ABNORMAL LOW (ref 3.4–5.0)
Alkaline Phosphatase: 76 U/L
Anion Gap: 3 — ABNORMAL LOW (ref 7–16)
BUN: 8 mg/dL (ref 7–18)
Bilirubin,Total: 0.4 mg/dL (ref 0.2–1.0)
Calcium, Total: 8.6 mg/dL (ref 8.5–10.1)
Chloride: 110 mmol/L — ABNORMAL HIGH (ref 98–107)
Co2: 29 mmol/L (ref 21–32)
Creatinine: 0.71 mg/dL (ref 0.60–1.30)
EGFR (African American): >60
EGFR (Non-African Amer.): >60
Glucose: 106 mg/dL — ABNORMAL HIGH (ref 65–99)
Osmolality: 282 (ref 275–301)
Potassium: 3.5 mmol/L (ref 3.5–5.1)
SGOT(AST): 22 U/L (ref 15–37)
SGPT (ALT): 19 U/L (ref 12–78)
Sodium: 142 mmol/L (ref 136–145)
Total Protein: 7.6 g/dL (ref 6.4–8.2)

## 2013-09-14 LAB — URINALYSIS, COMPLETE
Bilirubin,UR: NEGATIVE
Blood: NEGATIVE
Glucose,UR: NEGATIVE mg/dL (ref 0–75)
Leukocyte Esterase: NEGATIVE
Nitrite: NEGATIVE
Ph: 8 (ref 4.5–8.0)
Protein: NEGATIVE
RBC,UR: 1 /HPF (ref 0–5)
Specific Gravity: 1.011 (ref 1.003–1.030)
Squamous Epithelial: 6
WBC UR: 1 /HPF (ref 0–5)

## 2013-09-14 NOTE — Telephone Encounter (Signed)
Left message for pt to return my call, to confirm taken to ED

## 2013-09-14 NOTE — Telephone Encounter (Signed)
Left vm needing appt.  Returned pt call.  States vomiting, cannot stand up.  Transferred to triage.

## 2013-09-14 NOTE — Telephone Encounter (Signed)
Patient Information:  Caller Name: Dedra Skeens  Phone: [phone]  Patient: Michelle [name], Michelle [name]  Gender: Female  DOB: 04-29-1941  Age: 73 Years  PCP: Ronette Deter (Adults only)  Office Follow Up:  Does the office need to follow up with this patient?: Yes  Instructions For The Office: Please review.  Patient refused 911.   Symptoms  Reason For Call & Symptoms: Sibling calling regarding vomiting onset 09/13/13; no diarrhea. two episodes vomiting 09/14/13 am with yellow-greenish liquid reported.  She is unable to take her medications.   Forehead is cool, skin dry. She is too weak to stand up.  Advised Call EMS 911 Now due toShock suspected (eg, cold/ pale/ clammy skin, too weak to stand).  Caller states patient refuses to call 911.  She will call patient's spouse for outcome.  Reviewed Health History In EMR: Yes  Reviewed Medications In EMR: Yes  Reviewed Allergies In EMR: Yes  Reviewed Surgeries / Procedures: Yes  Date of Onset of Symptoms: 09/13/2013  Treatments Tried: Ginger ale- continued vomiting; unable to take meds  Treatments Tried Worked: No  Guideline(s) Used:  Vomiting  Disposition Per Guideline:   Call EMS 911 Now  Reason For Disposition Reached:   Shock suspected (e.g., cold/pale/clammy skin, too weak to stand)  Advice Given:  N/A  Patient Refused Recommendation:  Patient Refused Care Advice  Caller states plan to confer with patient's spouse.  Ice chips only for now.

## 2013-09-14 NOTE — Telephone Encounter (Signed)
I would highly recommend she go to ED for evaluation and IVF. She has several ongoing medical issues and will need immediate evaluation for low BP.

## 2013-09-15 NOTE — Telephone Encounter (Signed)
Pt was seen in ED, followup scheduled with Dr. Gilford Rile 09/20/13

## 2013-09-20 ENCOUNTER — Encounter: Payer: Self-pay | Admitting: Internal Medicine

## 2013-09-20 ENCOUNTER — Ambulatory Visit (INDEPENDENT_AMBULATORY_CARE_PROVIDER_SITE_OTHER): Payer: Medicare Other | Admitting: Internal Medicine

## 2013-09-20 VITALS — BP 128/80 | HR 69 | Temp 98.2°F | Wt 249.0 lb

## 2013-09-20 DIAGNOSIS — M069 Rheumatoid arthritis, unspecified: Secondary | ICD-10-CM

## 2013-09-20 MED ORDER — PREDNISONE 10 MG PO TABS: ORAL_TABLET | ORAL | Status: DC

## 2013-09-20 MED ORDER — OXYCODONE-ACETAMINOPHEN 5-325 MG PO TABS: 1.0000 | ORAL_TABLET | Freq: Four times a day (QID) | ORAL | Status: DC | PRN

## 2013-09-20 NOTE — Progress Notes (Signed)
Pre visit review using our clinic review tool, if applicable. No additional management support is needed unless otherwise documented below in the visit note. 

## 2013-09-20 NOTE — Patient Instructions (Signed)
Start Prednisone Taper.  Continue Oxycodone 5-325mg  up to four times daily as needed for severe pain.  We will schedule you to see Dr. Jefm Bryant.

## 2013-09-20 NOTE — Progress Notes (Signed)
Subjective:    Patient ID: Michelle [name], female    DOB: [birth year], 73 y.o.   MRN: [id number]  HPI 73YO female presents for ER follow up.  RA - stopped taking Methotrexate about 5 months ago.  Developed severe pain in left hip last week. Unable to walk. Vomiting because of pain. Went to [facility] ED. Xray was okay. Pt then went to [facility] ED. MRI which showed arthritis, per pt. Treated with prn oxycodone with minimal improvement. Continues to have worsening pain with movement of left leg. Able to ambulate. Has not yet followed up with rheumatology.   Review of Systems  Constitutional: Negative for fever, chills, appetite change, fatigue and unexpected weight change.  HENT: Negative for congestion, ear pain, sinus pressure, sore throat, trouble swallowing and voice change.   Eyes: Negative for visual disturbance.  Respiratory: Negative for cough, shortness of breath, wheezing and stridor.   Cardiovascular: Negative for chest pain, palpitations and leg swelling.  Gastrointestinal: Negative for nausea, vomiting, abdominal pain, diarrhea, constipation, blood in stool, abdominal distention and anal bleeding.  Genitourinary: Negative for dysuria and flank pain.  Musculoskeletal: Positive for arthralgias, back pain, gait problem and myalgias. Negative for neck pain.  Skin: Negative for color change and rash.  Neurological: Negative for dizziness and headaches.  Hematological: Negative for adenopathy. Does not bruise/bleed easily.  Psychiatric/Behavioral: Negative for suicidal ideas, sleep disturbance and dysphoric mood. The patient is not nervous/anxious.        Objective:    BP 128/80  Pulse 69  Temp(Src) 98.2 F (36.8 C) (Oral)  Wt 249 lb (112.946 kg)  SpO2 99% Physical Exam  Constitutional: She is oriented to person, place, and time. She appears well-developed and well-nourished. No distress.  HENT:  Head: Normocephalic and atraumatic.  Right Ear: External ear normal.  Left Ear:  External ear normal.  Nose: Nose normal.  Mouth/Throat: Oropharynx is clear and moist. No oropharyngeal exudate.  Eyes: Conjunctivae are normal. Pupils are equal, round, and reactive to light. Right eye exhibits no discharge. Left eye exhibits no discharge. No scleral icterus.  Neck: Normal range of motion. Neck supple. No tracheal deviation present. No thyromegaly present.  Cardiovascular: Normal rate, regular rhythm, normal heart sounds and intact distal pulses.  Exam reveals no gallop and no friction rub.   No murmur heard. Pulmonary/Chest: Effort normal and breath sounds normal. No accessory muscle usage. Not tachypneic. No respiratory distress. She has no decreased breath sounds. She has no wheezes. She has no rhonchi. She has no rales. She exhibits no tenderness.  Musculoskeletal: She exhibits no edema.       Left hip: She exhibits decreased range of motion, tenderness and bony tenderness. She exhibits normal strength.  Lymphadenopathy:    She has no cervical adenopathy.  Neurological: She is alert and oriented to person, place, and time. No cranial nerve deficit. She exhibits normal muscle tone. Coordination normal.  Skin: Skin is warm and dry. No rash noted. She is not diaphoretic. No erythema. No pallor.  Psychiatric: She has a normal mood and affect. Her behavior is normal. Judgment and thought content normal.          Assessment & Plan:  Over 77min of which >50% spent in face-to-face contact with patient discussing plan of care  Problem List Items Addressed This Visit   Rheumatoid arthritis flare - Primary     Symptoms consistent with RA flare. Reviewed MRI from [facility] with patient and her family today. Will start Prednisone taper. Continue  Oxycodone prn severe pain. Will set up follow up with Dr. Jefm Bryant. Given that she did not tolerate Methotrexate, question if another immuno-modulating medication might be helpful.    Relevant Medications      predniSONE (DELTASONE) tablet       oxyCODONE-acetaminophen (PERCOCET/ROXICET) 5-325 MG per tablet   Other Relevant Orders      Ambulatory referral to Rheumatology      Ambulatory referral to Physical Therapy       Return in about 4 weeks (around 10/18/2013) for Recheck.

## 2013-09-20 NOTE — Assessment & Plan Note (Signed)
Symptoms consistent with RA flare. Reviewed MRI from [facility] with patient and her family today. Will start Prednisone taper. Continue Oxycodone prn severe pain. Will set up follow up with Dr. Jefm Bryant. Given that she did not tolerate Methotrexate, question if another immuno-modulating medication might be helpful.

## 2013-09-28 ENCOUNTER — Encounter: Payer: Self-pay | Admitting: Internal Medicine

## 2013-09-30 ENCOUNTER — Encounter: Payer: Self-pay | Admitting: Internal Medicine

## 2013-10-04 ENCOUNTER — Ambulatory Visit: Payer: Medicare Other | Admitting: Internal Medicine

## 2013-10-06 ENCOUNTER — Encounter: Payer: Self-pay | Admitting: Internal Medicine

## 2013-10-06 ENCOUNTER — Ambulatory Visit (INDEPENDENT_AMBULATORY_CARE_PROVIDER_SITE_OTHER): Payer: Medicare Other | Admitting: Internal Medicine

## 2013-10-06 VITALS — BP 112/68 | HR 66 | Temp 98.1°F | Wt 246.0 lb

## 2013-10-06 DIAGNOSIS — M069 Rheumatoid arthritis, unspecified: Secondary | ICD-10-CM

## 2013-10-06 MED ORDER — OMEPRAZOLE 40 MG PO CPDR: DELAYED_RELEASE_CAPSULE | ORAL | Status: DC

## 2013-10-06 NOTE — Progress Notes (Signed)
   Subjective:    Patient ID: Michelle [name], female    DOB: [birth year], 73 y.o.   MRN: [id number]  HPI 73YO female presents for follow up.  RA - Recent flare has improved after prednisone taper. Seen by her rheumatologist. Planning to consider alternative meds. Started physical therapy. S/p cortisone injection left hip with some improvement in pain.  Review of Systems  Constitutional: Negative for fever, chills, appetite change, fatigue and unexpected weight change.  HENT: Negative for congestion, ear pain, sinus pressure, sore throat, trouble swallowing and voice change.   Eyes: Negative for visual disturbance.  Respiratory: Negative for cough, shortness of breath, wheezing and stridor.   Cardiovascular: Negative for chest pain, palpitations and leg swelling.  Gastrointestinal: Negative for nausea, vomiting, abdominal pain, diarrhea, constipation, blood in stool, abdominal distention and anal bleeding.  Genitourinary: Negative for dysuria and flank pain.  Musculoskeletal: Positive for arthralgias. Negative for gait problem, myalgias and neck pain.  Skin: Negative for color change and rash.  Neurological: Negative for dizziness and headaches.  Hematological: Negative for adenopathy. Does not bruise/bleed easily.  Psychiatric/Behavioral: Negative for suicidal ideas, sleep disturbance and dysphoric mood. The patient is not nervous/anxious.        Objective:    BP 112/68  Pulse 66  Temp(Src) 98.1 F (36.7 C) (Oral)  Wt 246 lb (111.585 kg)  SpO2 99% Physical Exam  Constitutional: She is oriented to person, place, and time. She appears well-developed and well-nourished. No distress.  HENT:  Head: Normocephalic and atraumatic.  Right Ear: External ear normal.  Left Ear: External ear normal.  Nose: Nose normal.  Mouth/Throat: Oropharynx is clear and moist. No oropharyngeal exudate.  Eyes: Conjunctivae are normal. Pupils are equal, round, and reactive to light. Right eye exhibits no  discharge. Left eye exhibits no discharge. No scleral icterus.  Neck: Normal range of motion. Neck supple. No tracheal deviation present. No thyromegaly present.  Cardiovascular: Normal rate, regular rhythm, normal heart sounds and intact distal pulses.  Exam reveals no gallop and no friction rub.   No murmur heard. Pulmonary/Chest: Effort normal and breath sounds normal. No accessory muscle usage. Not tachypneic. No respiratory distress. She has no decreased breath sounds. She has no wheezes. She has no rhonchi. She has no rales. She exhibits no tenderness.  Musculoskeletal: Normal range of motion. She exhibits no edema and no tenderness.  Lymphadenopathy:    She has no cervical adenopathy.  Neurological: She is alert and oriented to person, place, and time. No cranial nerve deficit. She exhibits normal muscle tone. Coordination normal.  Skin: Skin is warm and dry. No rash noted. She is not diaphoretic. No erythema. No pallor.  Psychiatric: She has a normal mood and affect. Her behavior is normal. Judgment and thought content normal.          Assessment & Plan:   Problem List Items Addressed This Visit   Rheumatoid arthritis(714.0) - Primary     Symptoms markedly improved with after recent flare. Will request notes from rheumatology. Plan to continue Prednisone 1mg  daily. May start back on Methotrexate depending on rheumatology evaluation. Follow up here 3 months and prn.    Relevant Medications      predniSONE (DELTASONE) 1 MG tablet       Return in about 3 months (around 01/06/2014) for Recheck, Physical.

## 2013-10-06 NOTE — Progress Notes (Signed)
Pre visit review using our clinic review tool, if applicable. No additional management support is needed unless otherwise documented below in the visit note. 

## 2013-10-06 NOTE — Assessment & Plan Note (Addendum)
Symptoms markedly improved with after recent flare. Will request notes from rheumatology. Plan to continue Prednisone 1mg  daily. May start back on Methotrexate depending on rheumatology evaluation. Follow up here 3 months and prn.

## 2013-10-21 ENCOUNTER — Telehealth: Payer: Self-pay | Admitting: Internal Medicine

## 2013-10-21 NOTE — Telephone Encounter (Signed)
She should not be taking oxycodone on a regular basis. This was only for severe pain during flare of rheumatoid arthritis. If in severe pain again, needs to follow up with Dr. Jefm Bryant.

## 2013-10-21 NOTE — Telephone Encounter (Signed)
Okay to refill? 

## 2013-10-21 NOTE — Telephone Encounter (Signed)
Patient stopped by the office to get a refill on rx for oxycodone acetaminophen s-325. Please call patient when script is ready for pickup./msn

## 2013-10-26 NOTE — Telephone Encounter (Signed)
Notified pt. 

## 2013-10-31 ENCOUNTER — Encounter: Payer: Self-pay | Admitting: Internal Medicine

## 2014-01-12 ENCOUNTER — Encounter: Payer: Self-pay | Admitting: Internal Medicine

## 2014-01-12 ENCOUNTER — Ambulatory Visit (INDEPENDENT_AMBULATORY_CARE_PROVIDER_SITE_OTHER): Payer: Medicare Other | Admitting: Internal Medicine

## 2014-01-12 VITALS — BP 126/64 | HR 69 | Temp 98.0°F | Ht 65.0 in | Wt 247.8 lb

## 2014-01-12 DIAGNOSIS — R739 Hyperglycemia, unspecified: Secondary | ICD-10-CM

## 2014-01-12 DIAGNOSIS — Z1239 Encounter for other screening for malignant neoplasm of breast: Secondary | ICD-10-CM

## 2014-01-12 DIAGNOSIS — Z135 Encounter for screening for eye and ear disorders: Secondary | ICD-10-CM

## 2014-01-12 DIAGNOSIS — Z13820 Encounter for screening for osteoporosis: Secondary | ICD-10-CM | POA: Insufficient documentation

## 2014-01-12 DIAGNOSIS — Z23 Encounter for immunization: Secondary | ICD-10-CM

## 2014-01-12 DIAGNOSIS — Z0000 Encounter for general adult medical examination without abnormal findings: Secondary | ICD-10-CM | POA: Insufficient documentation

## 2014-01-12 DIAGNOSIS — R7309 Other abnormal glucose: Secondary | ICD-10-CM

## 2014-01-12 MED ORDER — MELOXICAM 15 MG PO TABS: 15.0000 mg | ORAL_TABLET | Freq: Every day | ORAL | Status: DC

## 2014-01-12 MED ORDER — TRAMADOL HCL 50 MG PO TABS: 50.0000 mg | ORAL_TABLET | Freq: Three times a day (TID) | ORAL | Status: DC | PRN

## 2014-01-12 NOTE — Addendum Note (Signed)
Addended by: Vernetta Honey on: 01/12/2014 04:57 PM   Modules accepted: Orders

## 2014-01-12 NOTE — Progress Notes (Signed)
Pre visit review using our clinic review tool, if applicable. No additional management support is needed unless otherwise documented below in the visit note. 

## 2014-01-12 NOTE — Assessment & Plan Note (Signed)
General medical exam including breast exam normal today. Mammogram ordered. PAP and pelvic declined given age and previous normal PAP. Colonoscopy UTD. Immunizations UTD except for Prevnar which was given today. Labs today including CMP, A1c, lipids.

## 2014-01-12 NOTE — Patient Instructions (Signed)

## 2014-01-12 NOTE — Progress Notes (Signed)
The patient is here for annual Medicare Wellness Examination and management of other chronic and acute problems.   The risk factors are reflected in the history.  The roster of all physicians providing medical care to patient - is listed in the Snapshot section of the chart.  Activities of daily living:   The patient is 100% independent in all ADLs: dressing, toileting, feeding as well as independent mobility. Patient lives with husband in a one story home with carpet. No pets. No smokers in home. Gas heat. Electric AC.  Home safety :  The patient has smoke detectors in the home.  They wear seatbelts in their car. There are locked firearms at home.  There is no violence in the home. They feel safe where they live.  Infectious Risks: There is no risks for hepatitis, STDs or HIV.  There is no  history of blood transfusion.  They have no travel history to infectious disease endemic areas of the world.  Additional Health Care Providers: The patient has not seen their dentist in the last six months. Dentist - none They have not seen their eye doctor in the last year. Opthalmologist - none They deny hearing issues. They have deferred audiologic testing in the last year.   They do not  have excessive sun exposure. Discussed the need for sun protection: hats,long sleeves and use of sunscreen if there is significant sun exposure.  Dermatologist - none  Diet: the importance of a healthy diet is discussed. They do have a healthy diet.  The benefits of regular aerobic exercise were discussed. Patient exercises by walking. Goes to [facility] for exercise program, [employer].  Depression screen: there are no signs or vegative symptoms of depression- irritability, change in appetite, anhedonia, sadness/tearfullness.  Some stress caring for sons, [place], [place] and 23YO.  Cognitive assessment: the patient manages all their financial and personal affairs and is actively engaged. They could relate  day,date,year and events.  HCPOA - none in place  The following portions of the patient's history were reviewed and updated as appropriate: allergies, current medications, past family history, past medical history,  past surgical history, past social history and problem list.  Visual acuity was not assessed per patient preference as they have regular follow up with their ophthalmologist. Hearing and body mass index were assessed and reviewed.   During the course of the visit the patient was educated and counseled about appropriate screening and preventive services including : fall prevention , diabetes screening, nutrition counseling, colorectal cancer screening, and recommended immunizations.    Review of Systems  Constitutional: Negative for fever, chills, appetite change, fatigue and unexpected weight change.  HENT: Negative for hearing loss.   Eyes: Negative for visual disturbance.  Respiratory: Negative for shortness of breath.   Cardiovascular: Negative for chest pain and leg swelling.  Gastrointestinal: Negative for nausea, vomiting, abdominal pain, diarrhea, constipation and blood in stool.  Musculoskeletal: Positive for arthralgias and myalgias.  Skin: Negative for color change and rash.  Hematological: Negative for adenopathy. Does not bruise/bleed easily.  Psychiatric/Behavioral: Negative for dysphoric mood. The patient is not nervous/anxious.        Objective:    BP 126/64  Pulse 69  Temp(Src) 98 F (36.7 C) (Oral)  Ht 5\' 5"  (1.651 m)  Wt 247 lb 12 oz (112.379 kg)  BMI 41.23 kg/m2  SpO2 99% Physical Exam  Constitutional: She is oriented to person, place, and time. She appears well-developed and well-nourished. No distress.  HENT:  Head:  Normocephalic and atraumatic.  Right Ear: External ear normal.  Left Ear: External ear normal.  Nose: Nose normal.  Mouth/Throat: Oropharynx is clear and moist. No oropharyngeal exudate.  Eyes: Conjunctivae are normal. Pupils are  equal, round, and reactive to light. Right eye exhibits no discharge. Left eye exhibits no discharge. No scleral icterus.  Neck: Normal range of motion. Neck supple. No tracheal deviation present. No thyromegaly present.  Cardiovascular: Normal rate, regular rhythm, normal heart sounds and intact distal pulses.  Exam reveals no gallop and no friction rub.   No murmur heard. Pulmonary/Chest: Effort normal and breath sounds normal. No accessory muscle usage. Not tachypneic. No respiratory distress. She has no decreased breath sounds. She has no wheezes. She has no rales. She exhibits no tenderness. Right breast exhibits no inverted nipple, no mass, no nipple discharge, no skin change and no tenderness. Left breast exhibits no inverted nipple, no mass, no nipple discharge, no skin change and no tenderness. Breasts are symmetrical.  Abdominal: Soft. Bowel sounds are normal. She exhibits no distension and no mass. There is no tenderness. There is no rebound and no guarding.  Musculoskeletal: Normal range of motion. She exhibits no edema and no tenderness.  Lymphadenopathy:    She has no cervical adenopathy.  Neurological: She is alert and oriented to person, place, and time. No cranial nerve deficit. She exhibits normal muscle tone. Coordination normal.  Skin: Skin is warm and dry. No rash noted. She is not diaphoretic. No erythema. No pallor.  Psychiatric: She has a normal mood and affect. Her behavior is normal. Judgment and thought content normal.          Assessment & Plan:   Problem List Items Addressed This Visit     Unprioritized   Elevated blood sugar   Medicare annual wellness visit, subsequent - Primary     General medical exam including breast exam normal today. Mammogram ordered. PAP and pelvic declined given age and previous normal PAP. Colonoscopy UTD. Immunizations UTD except for Prevnar which was given today. Labs today including CMP, A1c, lipids.    Screening for breast cancer      Mammogram ordered.    Relevant Orders      MM Digital Screening   Screening for eye condition     Referral placed for opthalmology given h/o prednisone use.    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 Encouraged healthy diet and exercise with goal of weight loss.    Relevant Orders      Comprehensive metabolic panel      Hemoglobin A1c      Lipid panel      Microalbumin / creatinine urine ratio       Return in about 6 months (around 07/15/2014) for Recheck.

## 2014-01-12 NOTE — Assessment & Plan Note (Signed)
Referral placed for opthalmology given h/o prednisone use.

## 2014-01-12 NOTE — Assessment & Plan Note (Signed)
Mammogram ordered

## 2014-01-12 NOTE — Assessment & Plan Note (Signed)
Wt Readings from Last 3 Encounters:  01/12/14 247 lb 12 oz (112.379 kg)  10/06/13 246 lb (111.585 kg)  09/20/13 249 lb (112.946 kg)   Body mass index is 41.23 kg/(m^2). Encouraged healthy diet and exercise with goal of weight loss.

## 2014-01-13 ENCOUNTER — Encounter: Payer: Self-pay | Admitting: *Deleted

## 2014-01-13 LAB — LIPID PANEL
Cholesterol: 163 mg/dL (ref 0–200)
HDL: 61.4 mg/dL (ref >39.00)
LDL Cholesterol: 90 mg/dL (ref 0–99)
NonHDL: 101.6
Total CHOL/HDL Ratio: 3
Triglycerides: 58 mg/dL (ref 0.0–149.0)
VLDL: 11.6 mg/dL (ref 0.0–40.0)

## 2014-01-13 LAB — COMPREHENSIVE METABOLIC PANEL
ALT: 20 U/L (ref 0–35)
AST: 23 U/L (ref 0–37)
Albumin: 3.4 g/dL — ABNORMAL LOW (ref 3.5–5.2)
Alkaline Phosphatase: 61 U/L (ref 39–117)
BUN: 15 mg/dL (ref 6–23)
CO2: 29 mEq/L (ref 19–32)
Calcium: 9.4 mg/dL (ref 8.4–10.5)
Chloride: 106 mEq/L (ref 96–112)
Creatinine, Ser: 0.8 mg/dL (ref 0.4–1.2)
GFR: 90.94 mL/min (ref >60.00)
Glucose, Bld: 76 mg/dL (ref 70–99)
Potassium: 3.9 mEq/L (ref 3.5–5.1)
Sodium: 138 mEq/L (ref 135–145)
Total Bilirubin: 0.5 mg/dL (ref 0.2–1.2)
Total Protein: 6.3 g/dL (ref 6.0–8.3)

## 2014-01-13 LAB — MICROALBUMIN / CREATININE URINE RATIO
Creatinine,U: 77.6 mg/dL
Microalb Creat Ratio: 0.1 mg/g (ref 0.0–30.0)
Microalb, Ur: 0.1 mg/dL (ref 0.0–1.9)

## 2014-01-13 LAB — HEMOGLOBIN A1C: Hgb A1c MFr Bld: 6.1 % (ref 4.6–6.5)

## 2014-01-17 ENCOUNTER — Encounter: Payer: Self-pay | Admitting: *Deleted

## 2014-03-02 ENCOUNTER — Ambulatory Visit (INDEPENDENT_AMBULATORY_CARE_PROVIDER_SITE_OTHER): Payer: Medicare Other

## 2014-03-02 DIAGNOSIS — Z23 Encounter for immunization: Secondary | ICD-10-CM

## 2014-05-10 ENCOUNTER — Other Ambulatory Visit: Payer: Self-pay | Admitting: Internal Medicine

## 2014-05-10 NOTE — Telephone Encounter (Signed)
Ok refill. Last appointment 01/12/14.

## 2014-06-19 ENCOUNTER — Telehealth: Payer: Self-pay | Admitting: Internal Medicine

## 2014-06-19 NOTE — Telephone Encounter (Signed)
Needing Disability Parking Placard filled out before 1.31.16. Application given to [place].

## 2014-06-20 NOTE — Telephone Encounter (Signed)
Completed form, Advised pt ready for pick up, placed in folder for pick up

## 2014-06-30 ENCOUNTER — Other Ambulatory Visit: Payer: Self-pay | Admitting: Internal Medicine

## 2014-07-03 ENCOUNTER — Other Ambulatory Visit: Payer: Self-pay | Admitting: Internal Medicine

## 2014-07-03 NOTE — Telephone Encounter (Signed)
Last OV 8.13.15, last refill 11.19.15.  Please advise refill

## 2014-07-03 NOTE — Telephone Encounter (Signed)
Last visit 01/12/14

## 2014-07-03 NOTE — Telephone Encounter (Signed)
Called to pharmacy 

## 2014-07-03 NOTE — Telephone Encounter (Signed)
rx faxed

## 2014-08-02 DIAGNOSIS — M0579 Rheumatoid arthritis with rheumatoid factor of multiple sites without organ or systems involvement: Secondary | ICD-10-CM | POA: Diagnosis not present

## 2014-08-09 DIAGNOSIS — Z79899 Other long term (current) drug therapy: Secondary | ICD-10-CM | POA: Diagnosis not present

## 2014-08-27 ENCOUNTER — Other Ambulatory Visit: Payer: Self-pay | Admitting: Internal Medicine

## 2014-09-19 ENCOUNTER — Other Ambulatory Visit: Payer: Self-pay | Admitting: Internal Medicine

## 2014-09-19 ENCOUNTER — Ambulatory Visit (INDEPENDENT_AMBULATORY_CARE_PROVIDER_SITE_OTHER): Payer: Medicare Other | Admitting: Nurse Practitioner

## 2014-09-19 ENCOUNTER — Encounter: Payer: Self-pay | Admitting: Nurse Practitioner

## 2014-09-19 VITALS — BP 116/62 | HR 73 | Temp 98.2°F | Resp 14 | Ht 65.0 in | Wt 223.8 lb

## 2014-09-19 DIAGNOSIS — M79643 Pain in unspecified hand: Secondary | ICD-10-CM

## 2014-09-19 MED ORDER — MELOXICAM 15 MG PO TABS: ORAL_TABLET | ORAL | Status: DC

## 2014-09-19 NOTE — Patient Instructions (Signed)
See us when you need us ?

## 2014-09-19 NOTE — Progress Notes (Signed)
   Subjective:    Patient ID: Michelle [name], female    DOB: May 02, 1941, 74 y.o.   MRN: [id number]  HPI  Michelle [name] is a 74 yo female with a CC of Mobic refill.   1) Taking Meloxicam for extra muscle/joint pain. Pt has been using successfully for awhile and needs a refill. She reports improvement since the weather has been nicer. She is taking prilosec.   Review of Systems  Constitutional: Negative for fever, chills, diaphoresis and fatigue.  Respiratory: Negative for chest tightness, shortness of breath and wheezing.   Cardiovascular: Negative for chest pain, palpitations and leg swelling.  Gastrointestinal: Negative for nausea, vomiting, abdominal pain, diarrhea and abdominal distention.  Musculoskeletal: Positive for myalgias and arthralgias.       Hands and knees, pt seen by rheumatology for RA.   Skin: Negative for rash.  Neurological: Negative for dizziness, weakness, numbness and headaches.  Psychiatric/Behavioral: The patient is not nervous/anxious.       Objective:   Physical Exam  Constitutional: She is oriented to person, place, and time. She appears well-developed and well-nourished. No distress.  BP 116/62 mmHg  Pulse 73  Temp(Src) 98.2 F (36.8 C) (Oral)  Resp 14  Ht 5\' 5"  (1.651 m)  Wt 223 lb 12.8 oz (101.515 kg)  BMI 37.24 kg/m2  SpO2 98%   HENT:  Head: Normocephalic and atraumatic.  Right Ear: External ear normal.  Left Ear: External ear normal.  Cardiovascular: Normal rate, regular rhythm and normal heart sounds.  Exam reveals no gallop and no friction rub.   No murmur heard. Pulmonary/Chest: Effort normal and breath sounds normal. No respiratory distress. She has no wheezes. She has no rales. She exhibits no tenderness.  Musculoskeletal: She exhibits tenderness. She exhibits no edema.  Hands bilaterally  Neurological: She is alert and oriented to person, place, and time. No cranial nerve deficit. She exhibits normal muscle tone. Coordination normal.    Skin: Skin is warm and dry. No rash noted. She is not diaphoretic.  Psychiatric: She has a normal mood and affect. Her behavior is normal. Judgment and thought content normal.      Assessment & Plan:

## 2014-09-19 NOTE — Assessment & Plan Note (Signed)
Pt reports she started it awhile back. She is on methotrexate and is followed by Dr. Jefm Bryant. Pt understands risks of taking medication with prednisone/methotrexate. Warned her to stop mobic if stomach becomes upset or chest pains. Pt verbalized understanding.

## 2014-09-19 NOTE — Progress Notes (Signed)
Pre visit review using our clinic review tool, if applicable. No additional management support is needed unless otherwise documented below in the visit note. 

## 2014-11-09 DIAGNOSIS — Z79899 Other long term (current) drug therapy: Secondary | ICD-10-CM | POA: Diagnosis not present

## 2014-11-20 ENCOUNTER — Telehealth: Payer: Self-pay

## 2014-11-20 NOTE — Telephone Encounter (Signed)
Mammogram scheduled for 6.28.16 @ 3:00 @ [facility]

## 2014-11-28 ENCOUNTER — Other Ambulatory Visit: Payer: Self-pay | Admitting: Internal Medicine

## 2014-11-28 ENCOUNTER — Ambulatory Visit
Admission: RE | Admit: 2014-11-28 | Discharge: 2014-11-28 | Disposition: A | Discharge status: Home / Self Care | Payer: Medicare Other | Source: Ambulatory Visit | Attending: Internal Medicine | Admitting: Internal Medicine

## 2014-11-28 DIAGNOSIS — Z1239 Encounter for other screening for malignant neoplasm of breast: Secondary | ICD-10-CM

## 2014-11-28 DIAGNOSIS — Z1231 Encounter for screening mammogram for malignant neoplasm of breast: Secondary | ICD-10-CM | POA: Diagnosis not present

## 2014-11-29 ENCOUNTER — Other Ambulatory Visit: Payer: Medicare Other

## 2014-11-29 ENCOUNTER — Other Ambulatory Visit: Payer: Self-pay | Admitting: Internal Medicine

## 2014-11-29 DIAGNOSIS — R928 Other abnormal and inconclusive findings on diagnostic imaging of breast: Secondary | ICD-10-CM

## 2014-11-29 DIAGNOSIS — N63 Unspecified lump in unspecified breast: Secondary | ICD-10-CM

## 2014-12-11 ENCOUNTER — Other Ambulatory Visit: Payer: Self-pay | Admitting: Internal Medicine

## 2014-12-11 NOTE — Telephone Encounter (Signed)
Last refill 07/03/14, 90 tablets with 1 refill.  Last OV with you Aug 2015, seen Doss in April for hand pain,  No follow up appt scheduled

## 2014-12-18 ENCOUNTER — Ambulatory Visit: Payer: Medicare Other

## 2014-12-18 ENCOUNTER — Other Ambulatory Visit: Payer: Medicare Other

## 2014-12-18 DIAGNOSIS — M0579 Rheumatoid arthritis with rheumatoid factor of multiple sites without organ or systems involvement: Secondary | ICD-10-CM | POA: Diagnosis not present

## 2014-12-18 DIAGNOSIS — Z79899 Other long term (current) drug therapy: Secondary | ICD-10-CM | POA: Diagnosis not present

## 2014-12-19 ENCOUNTER — Ambulatory Visit
Admission: RE | Admit: 2014-12-19 | Discharge: 2014-12-19 | Disposition: A | Discharge status: Home / Self Care | Payer: Medicare Other | Source: Ambulatory Visit | Attending: Internal Medicine | Admitting: Internal Medicine

## 2014-12-19 ENCOUNTER — Other Ambulatory Visit: Payer: Self-pay | Admitting: Internal Medicine

## 2014-12-19 DIAGNOSIS — N63 Unspecified lump in unspecified breast: Secondary | ICD-10-CM

## 2014-12-19 DIAGNOSIS — R928 Other abnormal and inconclusive findings on diagnostic imaging of breast: Secondary | ICD-10-CM | POA: Diagnosis not present

## 2015-01-01 DIAGNOSIS — R591 Generalized enlarged lymph nodes: Secondary | ICD-10-CM

## 2015-01-01 HISTORY — DX: Generalized enlarged lymph nodes: R59.1

## 2015-01-25 ENCOUNTER — Other Ambulatory Visit: Payer: Self-pay | Admitting: Otolaryngology

## 2015-01-25 DIAGNOSIS — E041 Nontoxic single thyroid nodule: Secondary | ICD-10-CM | POA: Diagnosis not present

## 2015-01-25 DIAGNOSIS — J301 Allergic rhinitis due to pollen: Secondary | ICD-10-CM | POA: Diagnosis not present

## 2015-01-25 DIAGNOSIS — H6503 Acute serous otitis media, bilateral: Secondary | ICD-10-CM | POA: Diagnosis not present

## 2015-01-26 ENCOUNTER — Ambulatory Visit
Admission: RE | Admit: 2015-01-26 | Discharge: 2015-01-26 | Disposition: A | Discharge status: Home / Self Care | Payer: Medicare Other | Source: Ambulatory Visit | Attending: Otolaryngology | Admitting: Otolaryngology

## 2015-01-26 DIAGNOSIS — E042 Nontoxic multinodular goiter: Secondary | ICD-10-CM | POA: Insufficient documentation

## 2015-01-26 DIAGNOSIS — E041 Nontoxic single thyroid nodule: Secondary | ICD-10-CM

## 2015-02-15 DIAGNOSIS — J019 Acute sinusitis, unspecified: Secondary | ICD-10-CM | POA: Diagnosis not present

## 2015-02-15 DIAGNOSIS — H902 Conductive hearing loss, unspecified: Secondary | ICD-10-CM | POA: Diagnosis not present

## 2015-02-15 DIAGNOSIS — R51 Headache: Secondary | ICD-10-CM | POA: Diagnosis not present

## 2015-02-15 DIAGNOSIS — H6503 Acute serous otitis media, bilateral: Secondary | ICD-10-CM | POA: Diagnosis not present

## 2015-02-15 DIAGNOSIS — H6983 Other specified disorders of Eustachian tube, bilateral: Secondary | ICD-10-CM | POA: Diagnosis not present

## 2015-03-02 ENCOUNTER — Encounter: Payer: Self-pay | Admitting: Nurse Practitioner

## 2015-03-02 ENCOUNTER — Ambulatory Visit (INDEPENDENT_AMBULATORY_CARE_PROVIDER_SITE_OTHER): Payer: Medicare Other | Admitting: Nurse Practitioner

## 2015-03-02 VITALS — BP 106/70 | HR 72 | Temp 98.3°F | Resp 14 | Ht 65.0 in | Wt 208.2 lb

## 2015-03-02 DIAGNOSIS — J019 Acute sinusitis, unspecified: Secondary | ICD-10-CM | POA: Insufficient documentation

## 2015-03-02 DIAGNOSIS — J0141 Acute recurrent pansinusitis: Secondary | ICD-10-CM

## 2015-03-02 MED ORDER — AMOXICILLIN-POT CLAVULANATE 875-125 MG PO TABS: 1.0000 | ORAL_TABLET | Freq: Two times a day (BID) | ORAL | Status: DC

## 2015-03-02 MED ORDER — GUAIFENESIN-CODEINE 100-10 MG/5ML PO SYRP: 5.0000 mL | ORAL_SOLUTION | Freq: Every evening | ORAL | Status: DC | PRN

## 2015-03-02 NOTE — Progress Notes (Signed)
Patient ID: Michelle [name], female    DOB: [birth year]  Age: 74 y.o. MRN: [id number]  CC: Sinusitis   HPI [name] presents for CC of sinusitis x 5-6 weeks.   1) Fluticasone, Cefdinir, and 2 rounds of prednisone   Saw Dr. Richardson Landry  Right side tenderness  Bending over painful  Bad taste in mouth    Saline nasal spray- helped   Cough- non-productive   Mint candy  Denies anti-histamines    History Klare has a past medical history of Arthritis; Headache(784.0); GERD (gastroesophageal reflux disease); and Hyperlipidemia.   She has past surgical history that includes Cardiac catheterization (08/2004).   Her family history includes Arthritis in her brother; Cholelithiasis in her father and mother; Diabetes in her father, mother, and sister; Hypertension in her sister.She reports that she has never smoked. She does not have any smokeless tobacco history on file. She reports that she does not drink alcohol or use illicit drugs.  Outpatient Prescriptions Prior to Visit  Medication Sig Dispense Refill  . Calcium Carbonate-Vit D-Min (CALTRATE 600+D PLUS) 600-800 MG-UNIT CHEW Chew 1 tablet by mouth 2 (two) times daily. 60 tablet 6  . folic acid (FOLVITE) 1 MG tablet Take 1 mg by mouth.    . meloxicam (MOBIC) 15 MG tablet TAKE 1 TABLET (15 MG TOTAL) BY MOUTH DAILY WITH MEAL 30 tablet 3  . Multiple Vitamins-Minerals (MULTIVITAMIN GUMMIES ADULT) CHEW Chew by mouth.    . NON FORMULARY Herbalife-take one tablet every morning and every evening    . omeprazole (PRILOSEC) 40 MG capsule TAKE ONE CAPSULE EVERY DAY 30 capsule 5  . predniSONE (DELTASONE) 1 MG tablet Take 1 mg by mouth QID.    [name] traMADol (ULTRAM) 50 MG tablet TAKE 1 TABLET BY MOUTH EVERY 8 HOURS AS NEEDED 90 tablet 1  . vitamin E 400 UNIT capsule Take 400 Units by mouth daily.     No facility-administered medications prior to visit.    ROS Review of Systems  Constitutional: Negative for fever, chills, diaphoresis and  fatigue.  HENT: Positive for congestion, ear pain, postnasal drip, sinus pressure and sore throat. Negative for ear discharge, sneezing, tinnitus and trouble swallowing.   Eyes: Negative for pain, itching and visual disturbance.  Respiratory: Positive for cough. Negative for chest tightness, shortness of breath and wheezing.   Cardiovascular: Negative for chest pain, palpitations and leg swelling.  Gastrointestinal: Negative for nausea, vomiting and diarrhea.  Skin: Negative for rash.  Neurological: Positive for headaches. Negative for dizziness, weakness and numbness.  Psychiatric/Behavioral: The patient is not nervous/anxious.    Objective:  BP 106/70 mmHg  Pulse 72  Temp(Src) 98.3 F (36.8 C)  Resp 14  Ht 5\' 5"  (1.651 m)  Wt 208 lb 3.2 oz (94.439 kg)  BMI 34.65 kg/m2  SpO2 97%  Physical Exam  Constitutional: She is oriented to person, place, and time. She appears well-developed and well-nourished. No distress.  HENT:  Head: Normocephalic and atraumatic.  Right Ear: External ear normal.  Left Ear: External ear normal.  TM's clear bilaterally, but erythematous  Eyes: EOM are normal. Pupils are equal, round, and reactive to light. Right eye exhibits no discharge. Left eye exhibits no discharge. No scleral icterus.  Neck: Normal range of motion. Neck supple. No thyromegaly present.  Cardiovascular: Normal rate, regular rhythm and normal heart sounds.  Exam reveals no gallop and no friction rub.   No murmur heard. Pulmonary/Chest: Effort normal and breath sounds normal. No respiratory distress. She  has no wheezes. She has no rales. She exhibits no tenderness.  Lymphadenopathy:    She has cervical adenopathy.  Neurological: She is alert and oriented to person, place, and time. No cranial nerve deficit. She exhibits normal muscle tone. Coordination normal.  Skin: Skin is warm and dry. No rash noted. She is not diaphoretic.  Psychiatric: She has a normal mood and affect. Her behavior  is normal. Judgment and thought content normal.   Assessment & Plan:   There are no diagnoses linked to this encounter. I am having Ms. Sainvil start on amoxicillin-clavulanate and guaiFENesin-codeine. I am also having her maintain her NON FORMULARY, CALTRATE 600+D PLUS MINERALS, MULTIVITAMIN GUMMIES ADULT, vitamin E, folic acid, predniSONE, omeprazole, meloxicam, and traMADol.  Meds ordered this encounter  Medications  . amoxicillin-clavulanate (AUGMENTIN) 875-125 MG tablet    Sig: Take 1 tablet by mouth 2 (two) times daily.    Dispense:  20 tablet    Refill:  0    Order Specific Question:  Supervising Provider    Answer:  Deborra Medina L [[id number]]  . guaiFENesin-codeine (ROBITUSSIN AC) 100-10 MG/5ML syrup    Sig: Take 5 mLs by mouth at bedtime as needed for cough.    Dispense:  120 mL    Refill:  0    Order Specific Question:  Supervising Provider    Answer:  Crecencio Mc [[id number]]     Follow-up: Return if symptoms worsen or fail to improve.

## 2015-03-02 NOTE — Progress Notes (Signed)
Pre visit review using our clinic review tool, if applicable. No additional management support is needed unless otherwise documented below in the visit note. 

## 2015-03-02 NOTE — Patient Instructions (Signed)
Please take Augmentin as prescribed.   Please take a probiotic ( Align, Floraque or Culturelle) while you are on the antibiotic to prevent a serious antibiotic associated diarrhea  Called clostirudium dificile colitis and a vaginal yeast infection.   Cough syrup- 5 mL (1 tsp) at night due to drowsy effects.   Call us if not helpful or Dr. Richardson Landry.

## 2015-03-14 NOTE — Assessment & Plan Note (Signed)
5-6 weeks of symptoms and failed a few therapies (see HPI). Cheratussin script given to pt with instructions and she verbalized understanding of warnings and how to take. Augmentin suggested for 10 days and encouraged probiotics. FU prn worsening/failure to improve.

## 2015-03-21 ENCOUNTER — Other Ambulatory Visit: Payer: Self-pay | Admitting: *Deleted

## 2015-03-21 MED ORDER — MELOXICAM 15 MG PO TABS: ORAL_TABLET | ORAL | Status: DC

## 2015-03-28 DIAGNOSIS — H6983 Other specified disorders of Eustachian tube, bilateral: Secondary | ICD-10-CM | POA: Diagnosis not present

## 2015-03-28 DIAGNOSIS — J0100 Acute maxillary sinusitis, unspecified: Secondary | ICD-10-CM | POA: Diagnosis not present

## 2015-03-28 DIAGNOSIS — H8112 Benign paroxysmal vertigo, left ear: Secondary | ICD-10-CM | POA: Diagnosis not present

## 2015-03-28 DIAGNOSIS — H902 Conductive hearing loss, unspecified: Secondary | ICD-10-CM | POA: Diagnosis not present

## 2015-03-28 DIAGNOSIS — H6503 Acute serous otitis media, bilateral: Secondary | ICD-10-CM | POA: Diagnosis not present

## 2015-04-13 DIAGNOSIS — H6983 Other specified disorders of Eustachian tube, bilateral: Secondary | ICD-10-CM | POA: Diagnosis not present

## 2015-04-13 DIAGNOSIS — H900 Conductive hearing loss, bilateral: Secondary | ICD-10-CM | POA: Diagnosis not present

## 2015-04-13 DIAGNOSIS — H6523 Chronic serous otitis media, bilateral: Secondary | ICD-10-CM | POA: Diagnosis not present

## 2015-04-27 DIAGNOSIS — H524 Presbyopia: Secondary | ICD-10-CM | POA: Diagnosis not present

## 2015-04-27 DIAGNOSIS — H52223 Regular astigmatism, bilateral: Secondary | ICD-10-CM | POA: Diagnosis not present

## 2015-04-27 DIAGNOSIS — H2513 Age-related nuclear cataract, bilateral: Secondary | ICD-10-CM | POA: Diagnosis not present

## 2015-04-27 DIAGNOSIS — H5203 Hypermetropia, bilateral: Secondary | ICD-10-CM | POA: Diagnosis not present

## 2015-05-04 DIAGNOSIS — H8112 Benign paroxysmal vertigo, left ear: Secondary | ICD-10-CM | POA: Diagnosis not present

## 2015-05-09 ENCOUNTER — Other Ambulatory Visit: Payer: Self-pay | Admitting: Internal Medicine

## 2015-05-09 DIAGNOSIS — Z1231 Encounter for screening mammogram for malignant neoplasm of breast: Secondary | ICD-10-CM

## 2015-05-17 ENCOUNTER — Ambulatory Visit
Admission: RE | Admit: 2015-05-17 | Discharge: 2015-05-17 | Disposition: A | Discharge status: Home / Self Care | Payer: Medicare Other | Source: Ambulatory Visit | Attending: Internal Medicine | Admitting: Internal Medicine

## 2015-05-17 ENCOUNTER — Other Ambulatory Visit: Payer: Self-pay | Admitting: Internal Medicine

## 2015-05-17 ENCOUNTER — Telehealth: Payer: Self-pay | Admitting: *Deleted

## 2015-05-17 DIAGNOSIS — N6310 Unspecified lump in the right breast, unspecified quadrant: Secondary | ICD-10-CM

## 2015-05-17 DIAGNOSIS — Z1231 Encounter for screening mammogram for malignant neoplasm of breast: Secondary | ICD-10-CM

## 2015-05-17 DIAGNOSIS — N63 Unspecified lump in breast: Secondary | ICD-10-CM | POA: Insufficient documentation

## 2015-05-17 DIAGNOSIS — N6489 Other specified disorders of breast: Secondary | ICD-10-CM | POA: Diagnosis not present

## 2015-05-17 DIAGNOSIS — M069 Rheumatoid arthritis, unspecified: Secondary | ICD-10-CM | POA: Insufficient documentation

## 2015-05-17 DIAGNOSIS — R928 Other abnormal and inconclusive findings on diagnostic imaging of breast: Secondary | ICD-10-CM | POA: Diagnosis not present

## 2015-05-17 NOTE — Telephone Encounter (Signed)
Please advise 

## 2015-05-17 NOTE — Telephone Encounter (Signed)
Katie at [facility] requested a order for a diagnostic mammogram and a right breast ultrasound. Please advise

## 2015-05-17 NOTE — Telephone Encounter (Signed)
They can place this order and I will sign

## 2015-05-31 ENCOUNTER — Ambulatory Visit: Payer: Medicare Other

## 2015-05-31 ENCOUNTER — Other Ambulatory Visit: Payer: Self-pay

## 2015-06-01 ENCOUNTER — Ambulatory Visit
Admission: RE | Admit: 2015-06-01 | Discharge: 2015-06-01 | Disposition: A | Discharge status: Home / Self Care | Payer: Medicare Other | Source: Ambulatory Visit | Attending: Internal Medicine | Admitting: Internal Medicine

## 2015-06-01 DIAGNOSIS — R590 Localized enlarged lymph nodes: Secondary | ICD-10-CM | POA: Insufficient documentation

## 2015-06-01 DIAGNOSIS — Z0189 Encounter for other specified special examinations: Secondary | ICD-10-CM | POA: Insufficient documentation

## 2015-06-01 DIAGNOSIS — C8310 Mantle cell lymphoma, unspecified site: Secondary | ICD-10-CM

## 2015-06-01 DIAGNOSIS — Z9889 Other specified postprocedural states: Secondary | ICD-10-CM | POA: Diagnosis not present

## 2015-06-01 DIAGNOSIS — C8314 Mantle cell lymphoma, lymph nodes of axilla and upper limb: Secondary | ICD-10-CM | POA: Diagnosis not present

## 2015-06-01 DIAGNOSIS — N63 Unspecified lump in breast: Secondary | ICD-10-CM | POA: Insufficient documentation

## 2015-06-01 DIAGNOSIS — R599 Enlarged lymph nodes, unspecified: Secondary | ICD-10-CM | POA: Diagnosis not present

## 2015-06-01 DIAGNOSIS — N6310 Unspecified lump in the right breast, unspecified quadrant: Secondary | ICD-10-CM

## 2015-06-01 HISTORY — DX: Mantle cell lymphoma, unspecified site: C83.10

## 2015-06-13 ENCOUNTER — Encounter: Payer: Self-pay | Admitting: Internal Medicine

## 2015-06-13 ENCOUNTER — Ambulatory Visit (INDEPENDENT_AMBULATORY_CARE_PROVIDER_SITE_OTHER): Payer: Medicare Other | Admitting: Internal Medicine

## 2015-06-13 VITALS — BP 121/72 | HR 67 | Temp 98.0°F | Ht 65.0 in | Wt 209.5 lb

## 2015-06-13 DIAGNOSIS — C8310 Mantle cell lymphoma, unspecified site: Secondary | ICD-10-CM | POA: Diagnosis not present

## 2015-06-13 NOTE — Telephone Encounter (Signed)
-----   Message from Lanette Hampshire sent at 06/13/2015  8:24 AM EST ----- Dr. Gilford Rile, I am a radiologist assistant with [facility].  Dr. Derrel Nip did a biopsy on Michelle [name] on 06/01/15.  Dr. Derrel Nip and I both have tried to call her to check on the biopsy sites and also last week to let her know the results were delayed (we didn't get the results for a week).  She will not return our calls.  I also saw in the pathology report, the results were called to 2 doctors, but not to you.  When you get a chance, will you please call me at [phone].  Thank you.  Jetta Lout

## 2015-06-13 NOTE — Telephone Encounter (Signed)
Called [place]. 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 This was called to Dr. Jefm Bryant and Dr. Grayland Ormond. It has not been called to our office. They have attempted to call the patient with no answer.  We will schedule the patient for follow up here. 1pm today

## 2015-06-13 NOTE — Progress Notes (Signed)
Subjective:    Patient ID: Michelle [name], female    DOB: Feb 11, 1941, 75 y.o.   MRN: [id number]  HPI  75YO female presents for follow up.  She recently had a breast biopsy performed at [facility] on 12/30. We received a call from [facility] pathology today about her path report, which showed Mantle Cell lymphoma.  She reports the biopsy sites have been healing well. No pain at sites. She notes some swollen lymph nodes in her neck. These have been present for many months. She denies night sweats. She notes decreased appetite and poor taste for food. No family history of lymphoma.  Wt Readings from Last 3 Encounters:  06/13/15 209 lb 8 oz (95.029 kg)  03/02/15 208 lb 3.2 oz (94.439 kg)  09/19/14 223 lb 12.8 oz (101.515 kg)   BP Readings from Last 3 Encounters:  06/13/15 121/72  03/02/15 106/70  09/19/14 116/62    Past Medical History  Diagnosis Date  . Arthritis   . Headache(784.0)   . GERD (gastroesophageal reflux disease)   . Hyperlipidemia     hx   Family History  Problem Relation Age of Onset  . Diabetes Mother   . Cholelithiasis Mother   . Diabetes Father   . Cholelithiasis Father   . Hypertension Sister   . Diabetes Sister   . Arthritis Brother    Past Surgical History  Procedure Laterality Date  . Cardiac catheterization  08/2004    [facility]   Social History   Social History  . Marital Status: Married    Spouse Name: N/A  . Number of Children: N/A  . Years of Education: N/A   Social History Main Topics  . Smoking status: Never Smoker   . Smokeless tobacco: None  . Alcohol Use: No  . Drug Use: No  . Sexual Activity: Not Asked   Other Topics Concern  . None   Social History Narrative   Lives in [place]. Works as Psychiatrist.    Review of Systems  Constitutional: Positive for appetite change. Negative for fever, chills, fatigue and unexpected weight change.  Eyes: Negative for visual disturbance.  Respiratory: Negative for  shortness of breath.   Cardiovascular: Negative for chest pain and leg swelling.  Gastrointestinal: Negative for nausea, vomiting, abdominal pain, diarrhea and constipation.  Musculoskeletal: Positive for myalgias and arthralgias.  Skin: Negative for color change and rash.  Hematological: Positive for adenopathy. Does not bruise/bleed easily.  Psychiatric/Behavioral: Negative for sleep disturbance and dysphoric mood. The patient is not nervous/anxious.        Objective:    BP 121/72 mmHg  Pulse 67  Temp(Src) 98 F (36.7 C) (Oral)  Ht 5\' 5"  (1.651 m)  Wt 209 lb 8 oz (95.029 kg)  BMI 34.86 kg/m2  SpO2 99% Physical Exam  Constitutional: She is oriented to person, place, and time. She appears well-developed and well-nourished. No distress.  HENT:  Head: Normocephalic and atraumatic.  Right Ear: External ear normal.  Left Ear: External ear normal.  Nose: Nose normal.  Mouth/Throat: Oropharynx is clear and moist. No oropharyngeal exudate.  Eyes: Conjunctivae are normal. Pupils are equal, round, and reactive to light. Right eye exhibits no discharge. Left eye exhibits no discharge. No scleral icterus.  Neck: Normal range of motion. Neck supple. No tracheal deviation present. No thyromegaly present.  Cardiovascular: Normal rate, regular rhythm, normal heart sounds and intact distal pulses.  Exam reveals no gallop and no friction rub.   No murmur heard. Pulmonary/Chest:  Effort normal and breath sounds normal. No respiratory distress. She has no wheezes. She has no rales. She exhibits no tenderness. Right breast exhibits no inverted nipple, no mass, no nipple discharge, no skin change and no tenderness. Left breast exhibits no inverted nipple, no mass, no nipple discharge, no skin change and no tenderness. Breasts are symmetrical.    Musculoskeletal: Normal range of motion. She exhibits no edema or tenderness.  Lymphadenopathy:       Head (right side): Submandibular adenopathy present.        Head (left side): Submandibular adenopathy present.    She has no cervical adenopathy.    She has axillary adenopathy.  Large rubbery lymphadenopathy submandibular area bilaterally  Neurological: She is alert and oriented to person, place, and time. No cranial nerve deficit. She exhibits normal muscle tone. Coordination normal.  Skin: Skin is warm and dry. No rash noted. She is not diaphoretic. No erythema. No pallor.  Psychiatric: She has a normal mood and affect. Her behavior is normal. Judgment and thought content normal.          Assessment & Plan:  Over 25min of which >50% spent in face-to-face contact with patient and her husband discussing diagnosis of mantle cell lymphoma and additional steps in evaluation and management.  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 Reviewed pathology and diagnosis with pt and her husband. Will set up oncology evaluation. We discussed that additional testing will be needed, likely to include imaging. Will get CBC and CMP today. Follow up in 4 weeks after oncology evaluation complete.      Relevant Medications   methotrexate (RHEUMATREX) 2.5 MG tablet   Other Relevant Orders   Comprehensive metabolic panel   CBC w/Diff   Ambulatory referral to Oncology       Return in about 3 weeks (around 07/04/2015) for Recheck.

## 2015-06-13 NOTE — Progress Notes (Signed)
Pre visit review using our clinic review tool, if applicable. No additional management support is needed unless otherwise documented below in the visit note. 

## 2015-06-13 NOTE — Patient Instructions (Addendum)
We will set up an evaluation with oncology here at [facility].  Follow up here in 2-3 weeks.  If you have any questions, please call and ask for Almyra Free to get a message to me.

## 2015-06-13 NOTE — Assessment & Plan Note (Signed)
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 Reviewed pathology and diagnosis with pt and her husband. Will set up oncology evaluation. We discussed that additional testing will be needed, likely to include imaging. Will get CBC and CMP today. Follow up in 4 weeks after oncology evaluation complete.

## 2015-06-14 ENCOUNTER — Other Ambulatory Visit: Payer: Self-pay | Admitting: Internal Medicine

## 2015-06-14 ENCOUNTER — Inpatient Hospital Stay: Payer: Medicare Other

## 2015-06-14 ENCOUNTER — Encounter: Payer: Self-pay | Admitting: Internal Medicine

## 2015-06-14 ENCOUNTER — Inpatient Hospital Stay: Payer: Medicare Other | Attending: Internal Medicine | Admitting: Internal Medicine

## 2015-06-14 VITALS — BP 121/78 | HR 75 | Temp 98.2°F | Resp 18 | Ht 66.5 in | Wt 206.4 lb

## 2015-06-14 DIAGNOSIS — M069 Rheumatoid arthritis, unspecified: Secondary | ICD-10-CM | POA: Diagnosis not present

## 2015-06-14 DIAGNOSIS — R599 Enlarged lymph nodes, unspecified: Secondary | ICD-10-CM | POA: Insufficient documentation

## 2015-06-14 DIAGNOSIS — R630 Anorexia: Secondary | ICD-10-CM | POA: Insufficient documentation

## 2015-06-14 DIAGNOSIS — R6881 Early satiety: Secondary | ICD-10-CM | POA: Diagnosis not present

## 2015-06-14 DIAGNOSIS — K219 Gastro-esophageal reflux disease without esophagitis: Secondary | ICD-10-CM

## 2015-06-14 DIAGNOSIS — C8311 Mantle cell lymphoma, lymph nodes of head, face, and neck: Secondary | ICD-10-CM

## 2015-06-14 DIAGNOSIS — Z79899 Other long term (current) drug therapy: Secondary | ICD-10-CM

## 2015-06-14 DIAGNOSIS — R634 Abnormal weight loss: Secondary | ICD-10-CM

## 2015-06-14 DIAGNOSIS — C8310 Mantle cell lymphoma, unspecified site: Secondary | ICD-10-CM

## 2015-06-14 DIAGNOSIS — R05 Cough: Secondary | ICD-10-CM | POA: Diagnosis not present

## 2015-06-14 DIAGNOSIS — R0602 Shortness of breath: Secondary | ICD-10-CM | POA: Insufficient documentation

## 2015-06-14 DIAGNOSIS — E785 Hyperlipidemia, unspecified: Secondary | ICD-10-CM | POA: Diagnosis not present

## 2015-06-14 LAB — COMPREHENSIVE METABOLIC PANEL
ALT: 16 U/L (ref 14–54)
AST: 25 U/L (ref 15–41)
Albumin: 3.8 g/dL (ref 3.5–5.0)
Alkaline Phosphatase: 66 U/L (ref 38–126)
Anion gap: 4 — ABNORMAL LOW (ref 5–15)
BUN: 12 mg/dL (ref 6–20)
CO2: 27 mmol/L (ref 22–32)
Calcium: 8.9 mg/dL (ref 8.9–10.3)
Chloride: 105 mmol/L (ref 101–111)
Creatinine, Ser: 0.62 mg/dL (ref 0.44–1.00)
GFR calc Af Amer: >60 mL/min (ref >60)
GFR calc non Af Amer: >60 mL/min (ref >60)
Glucose, Bld: 101 mg/dL — ABNORMAL HIGH (ref 65–99)
Potassium: 3.3 mmol/L — ABNORMAL LOW (ref 3.5–5.1)
Sodium: 136 mmol/L (ref 135–145)
Total Bilirubin: 0.7 mg/dL (ref 0.3–1.2)
Total Protein: 7.1 g/dL (ref 6.5–8.1)

## 2015-06-14 LAB — CBC WITH DIFFERENTIAL/PLATELET
Basophils Absolute: 0 10*3/uL (ref 0–0.1)
Basophils Relative: 0 %
Eosinophils Absolute: 0.1 10*3/uL (ref 0–0.7)
Eosinophils Relative: 1 %
HCT: 39 % (ref 35.0–47.0)
Hemoglobin: 12.6 g/dL (ref 12.0–16.0)
Lymphocytes Relative: 39 %
Lymphs Abs: 2.6 10*3/uL (ref 1.0–3.6)
MCH: 28.5 pg (ref 26.0–34.0)
MCHC: 32.4 g/dL (ref 32.0–36.0)
MCV: 88 fL (ref 80.0–100.0)
Monocytes Absolute: 0.3 10*3/uL (ref 0.2–0.9)
Monocytes Relative: 4 %
Neutro Abs: 3.7 10*3/uL (ref 1.4–6.5)
Neutrophils Relative %: 56 %
Platelets: 213 10*3/uL (ref 150–440)
RBC: 4.44 MIL/uL (ref 3.80–5.20)
RDW: 15.5 % — ABNORMAL HIGH (ref 11.5–14.5)
WBC: 6.8 10*3/uL (ref 3.6–11.0)

## 2015-06-14 LAB — LACTATE DEHYDROGENASE: LDH: 195 U/L — ABNORMAL HIGH (ref 98–192)

## 2015-06-14 NOTE — Progress Notes (Signed)
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  female with a history of rheumatoid arthritis on methotrexate had a screening mammogram that was abnormal which led to an ultrasound/ultrasound-guided biopsy of a upper-inner quadrant approximately 1.2 centimeter intramammary lymph node/also right axillary lymph node. Patient is here accompanied by her niece to discuss the pathology/treatment options.  Patient herself denies noticing the lumps or bumps. She denies any night sweats. Denies any fevers. She has about 15 pounds weight loss in the last 3-6 months. Appetite is poor. She complains of shortness of breath with exertion. No swelling in the legs. Denies any nausea vomiting or abdominal pain. Does admit to early satiety.  ROS: A complete 10 point review of system is done which is negative except mentioned above in history of present illness  MEDICAL HISTORY:  Past Medical History  Diagnosis Date  . Arthritis   . Headache(784.0)   . GERD (gastroesophageal reflux disease)   . Hyperlipidemia     hx  . Lymphoma, mantle cell ([place]) 06/01/2015    bx of lymph node in right breast  . Lymphadenopathy of head and neck 01/2015    see on Thyroid ultrasound    SURGICAL HISTORY: Past Surgical History  Procedure Laterality Date  . Cardiac catheterization  08/2004    [facility]    SOCIAL HISTORY: Social History   Social History  . Marital Status: Married    Spouse Name: N/A  . Number of Children: N/A  . Years of Education: N/A   Occupational History  . Not on file.   Social History Main Topics  . Smoking status:  Never Smoker   . Smokeless tobacco: Never Used  . Alcohol Use: No  . Drug Use: No  . Sexual Activity: Not on file   Other Topics Concern  . Not on file   Social History Narrative   Lives in [place]. Works as Psychiatrist.    FAMILY HISTORY: Family History  Problem Relation Age of Onset  . Diabetes Mother   . Cholelithiasis Mother   . Hypertension Sister   . Diabetes Sister   . Arthritis Brother     ALLERGIES:  has No Known Allergies.  MEDICATIONS:  Current Outpatient Prescriptions  Medication Sig Dispense Refill  . Calcium Carbonate-Vit D-Min (CALTRATE 600+D PLUS) 600-800 MG-UNIT CHEW Chew 1 tablet by mouth 2 (two) times daily. 60 tablet 6  . fluticasone (FLONASE) 50 MCG/ACT nasal spray Place 1 spray into both nostrils 2 (two) times daily.  4  . folic acid (FOLVITE) 1 MG tablet Take 1 mg by mouth.    . meloxicam (MOBIC) 15 MG tablet TAKE 1 TABLET (15 MG TOTAL) BY MOUTH DAILY WITH MEAL 30 tablet 3  . methotrexate (RHEUMATREX) 2.5 MG tablet Take 2.5 mg by mouth. 6 tablets every 7 days    . Multiple Vitamins-Minerals (MULTIVITAMIN GUMMIES ADULT) CHEW Chew by mouth.    . NON FORMULARY Herbalife-take one tablet every morning and every evening    . omeprazole (PRILOSEC) 40 MG capsule TAKE ONE CAPSULE EVERY DAY 30 capsule 5  . traMADol (ULTRAM) 50 MG tablet TAKE 1 TABLET BY  MOUTH EVERY 8 HOURS AS NEEDED 90 tablet 1  . vitamin E 400 UNIT capsule Take 400 Units by mouth daily.     No current facility-administered medications for this visit.      [name]  PHYSICAL EXAMINATION: ECOG PERFORMANCE STATUS: 0 - Asymptomatic  Filed Vitals:   06/14/15 0825  BP: 121/78  Pulse: 75  Temp: 98.2 F (36.8 C)  Resp: 18   Filed Weights   06/14/15 0825  Weight: 206 lb 5.6 oz (93.6 kg)    GENERAL: Well-nourished well-developed; Alert, no distress and comfortable.   accompanied by her niece. She is able to sit on the exam table without any significant help. EYES: no pallor or  icterus OROPHARYNX: no thrush or ulceration; good dentition  NECK: supple, no masses felt LYMPH:  Palpable lymphadenopathy in the cervical, axillary [2-3cm] ; inguinal regions LUNGS: clear to auscultation and  No wheeze or crackles HEART/CVS: regular rate & rhythm and no murmurs; No lower extremity edema ABDOMEN: abdomen soft, non-tender and normal bowel sounds; positive for hepatomegaly.  Musculoskeletal:no cyanosis of digits and no clubbing;   PSYCH: alert & oriented x 3 with fluent speech NEURO: no focal motor/sensory deficits SKIN:  no rashes or significant lesions  LABORATORY DATA:  I have reviewed the data as listed Lab Results  Component Value Date   WBC 6.8 06/14/2015   HGB 12.6 06/14/2015   HCT 39.0 06/14/2015   MCV 88.0 06/14/2015   PLT 213 06/14/2015    Recent Labs  06/14/15 0942  NA 136  K 3.3*  CL 105  CO2 27  GLUCOSE 101*  BUN 12  CREATININE 0.62  CALCIUM 8.9  GFRNONAA >60  GFRAA >60  PROT 7.1  ALBUMIN 3.8  AST 25  ALT 16  ALKPHOS 66  BILITOT 0.7    RADIOGRAPHIC STUDIES: I have personally reviewed the radiological images as listed and agreed with the findings in the report. Mm Digital Diagnostic Unilat R  06/01/2015  CLINICAL DATA:  Patient is post ultrasound-guided core needle biopsy of a 1.3 cm mass over the 12:30 position of the right breast 10 cm from the nipple as well as biopsy of an abnormal right axillary lymph node. EXAM: DIAGNOSTIC RIGHT MAMMOGRAM POST ULTRASOUND BIOPSY COMPARISON:  Previous exam(s). FINDINGS: Mammographic images were obtained following ultrasound guided biopsy of the 1.3 cm mass over the 12:30 position of the right breast 10 cm from the nipple as well as biopsy of a right axillary lymph node. Exam demonstrates satisfactory placement of the coil shaped metallic clip adjacent the biopsied mass as the clip lies 5 mm anterior inferior to the biopsied mass. IMPRESSION: Satisfactory clip placement post ultrasound-guided core biopsy  right breast mass. Final Assessment: Post Procedure Mammograms for Marker Placement Electronically Signed   By: Marin Olp M.D.   On: 06/01/2015 10:16   US Breast Ltd Uni Right Inc Axilla  05/17/2015  CLINICAL DATA:  Follow-up for a probably benign right breast mass. The patient states she does have a history of rheumatoid arthritis, however no history of malignancies such as lymphoma. EXAM: DIGITAL DIAGNOSTIC BILATERAL MAMMOGRAM WITH 3D TOMOSYNTHESIS AND CAD RIGHT BREAST ULTRASOUND COMPARISON:  Previous exam(s). ACR Breast Density Category b: There are scattered areas of fibroglandular density. FINDINGS: There is an oval circumscribed lobulated mass in the upper inner right breast measuring approximately 1.2 cm, similar in appearance when compared to the prior exam. There are enlarged lymph nodes with thickened cortices in the bilateral axilla. Mammographic images were processed  with CAD. Physical examination of the upper inner right breast does not reveal any palpable masses. Targeted ultrasound of the right breast was performed demonstrating an oval lobulated circumscribed mass in the right breast at 12:30 10 cm from nipple measuring 1.3 x 0.5 x 0.9 cm. This is felt to represent an intramammary lymph node. In addition, enlarged lymph nodes with thickened cortices are seen in the bilateral axilla with a representative right axillary lymph node measuring 3.7 x 1.3 by 2.4 cm with a cortical thickness of 0.8 cm. IMPRESSION: Suspicious right breast intramammary lymph node at 12:30 10 cm from nipple as well as bilateral axillary lymphadenopathy. While this could be related to patient's history of autoimmune disease (rheumatoid arthritis), malignancy cannot be excluded and therefore biopsy is recommended. RECOMMENDATION: Ultrasound-guided biopsy of the lymph node in the right breast at 12:30 10 cm from the nipple as well as of an enlarged right axillary lymph node. This will be scheduled at the patient's  convenience. I have discussed the findings and recommendations with the patient. Results were also provided in writing at the conclusion of the visit. If applicable, a reminder letter will be sent to the patient regarding the next appointment. BI-RADS CATEGORY  4: Suspicious. Electronically Signed   By: Everlean Alstrom M.D.   On: 05/17/2015 12:03   Mm Diag Breast Tomo Bilateral  05/17/2015  CLINICAL DATA:  Follow-up for a probably benign right breast mass. The patient states she does have a history of rheumatoid arthritis, however no history of malignancies such as lymphoma. EXAM: DIGITAL DIAGNOSTIC BILATERAL MAMMOGRAM WITH 3D TOMOSYNTHESIS AND CAD RIGHT BREAST ULTRASOUND COMPARISON:  Previous exam(s). ACR Breast Density Category b: There are scattered areas of fibroglandular density. FINDINGS: There is an oval circumscribed lobulated mass in the upper inner right breast measuring approximately 1.2 cm, similar in appearance when compared to the prior exam. There are enlarged lymph nodes with thickened cortices in the bilateral axilla. Mammographic images were processed with CAD. Physical examination of the upper inner right breast does not reveal any palpable masses. Targeted ultrasound of the right breast was performed demonstrating an oval lobulated circumscribed mass in the right breast at 12:30 10 cm from nipple measuring 1.3 x 0.5 x 0.9 cm. This is felt to represent an intramammary lymph node. In addition, enlarged lymph nodes with thickened cortices are seen in the bilateral axilla with a representative right axillary lymph node measuring 3.7 x 1.3 by 2.4 cm with a cortical thickness of 0.8 cm. IMPRESSION: Suspicious right breast intramammary lymph node at 12:30 10 cm from nipple as well as bilateral axillary lymphadenopathy. While this could be related to patient's history of autoimmune disease (rheumatoid arthritis), malignancy cannot be excluded and therefore biopsy is recommended. RECOMMENDATION:  Ultrasound-guided biopsy of the lymph node in the right breast at 12:30 10 cm from the nipple as well as of an enlarged right axillary lymph node. This will be scheduled at the patient's convenience. I have discussed the findings and recommendations with the patient. Results were also provided in writing at the conclusion of the visit. If applicable, a reminder letter will be sent to the patient regarding the next appointment. BI-RADS CATEGORY  4: Suspicious. Electronically Signed   By: Everlean Alstrom M.D.   On: 05/17/2015 12:03   [place] Rt Breast Bx W Loc Dev 1st Lesion Img Bx Spec US Guide  06/13/2015  ADDENDUM REPORT: 06/13/2015 13:44 ADDENDUM: Pathology of the right breast biopsy and right axillary lymph node biopsy both revealed  MANTLE CELL LYMPHOMA. These findings were communicated to Drs. Raylene Everts on 06/08/2015 by the pathologist. This was found to be concordant by Dr. Derrel Nip. Recommendations:  Recommend oncology referral. Multiple attempts were made by Jetta Lout, [facility]) and Dr. Derrel Nip to reach the patient between January 3-6, 2017 for a biopsy site check up and also to inform the patient of a delay in the pathology results, but were unsuccessful. Jetta Lout, RRA spoke with Dr. Ronette Deter, the referring physician on 06/13/15. Dr. Gilford Rile stated the patient has an appointment today at 1:00 PM to discuss the biopsy results. Dr. Gilford Rile will make the oncology referral. Addendum by Jetta Lout, [place] on 06/13/15. Electronically Signed   By: Marin Olp M.D.   On: 06/13/2015 13:44  06/13/2015  CLINICAL DATA:  Patient presents for ultrasound-guided core needle biopsy of a 1.3 cm mass over the 12:30 position of the right breast 10 cm from the nipple. EXAM: ULTRASOUND GUIDED right BREAST CORE NEEDLE BIOPSY COMPARISON:  Previous exam(s). PROCEDURE: I met with the patient and we discussed the procedure of ultrasound-guided biopsy, including benefits and alternatives. We discussed  the high likelihood of a successful procedure. We discussed the risks of the procedure including infection, bleeding, tissue injury, clip migration, and inadequate sampling. Informed written consent was given. The usual time-out protocol was performed immediately prior to the procedure. Using sterile technique and 2% Lidocaine as local anesthetic, under direct ultrasound visualization, a 12 gauge vacuum-assisted device was used to perform biopsy of the targeted mass at the 12:30 positionusing a inferior to superior approach. At the conclusion of the procedure, a coil shaped tissue marker clip was deployed into the biopsy cavity. Follow-up 2-view mammogram was performed and dictated separately. IMPRESSION: Ultrasound-guided biopsy of a right breast mass. No apparent complications. Electronically Signed: By: Marin Olp M.D. On: 06/01/2015 10:18   [place] Rt Breast Bx W Loc Dev Ea Add Lesion Img Bx Spec US Guide  06/13/2015  ADDENDUM REPORT: 06/13/2015 13:51 ADDENDUM: Pathology of the right breast mass biopsy and right axillary lymph node biopsy both revealed MANTLE CELL LYMPHOMA. Per the pathology report, these findings were communicated to Drs. Raylene Everts on 06/08/2015 by the pathologist. This patholgy finding was found to be concordant by Dr. Derrel Nip. Recommendations:  Recommend oncology referral. Multiple attempts were made by Jetta Lout, [facility] Radiology) and Dr. Derrel Nip from 1/3 - 06/08/15 for a post biopsy check up and to inform the patient of a delay in the results but were unsuccessful. Jetta Lout, RRA spoke with Dr. Ronette Deter on the morning of 06/13/15. Dr. Gilford Rile stated the patient has an appointment to see her today at 1:00 PM for the biopsy results. She will make the oncology referral. Addendum by Jetta Lout, RRA on 06/13/15. Electronically Signed   By: Marin Olp M.D.   On: 06/13/2015 13:51  06/13/2015  CLINICAL DATA:  Patient presents for ultrasound-guided needle biopsy of the  abnormal right axillary lymph node. Patient was noted to have bilateral abnormal axillary lymph nodes with thickened cortices on recent mammogram. Known rheumatoid arthritis. EXAM: ULTRASOUND GUIDED CORE NEEDLE BIOPSY OF A RIGHT AXILLARY NODE COMPARISON:  Previous exam(s). FINDINGS: I met with the patient and we discussed the procedure of ultrasound-guided biopsy, including benefits and alternatives. We discussed the high likelihood of a successful procedure. We discussed the risks of the procedure, including infection, bleeding, tissue injury, clip migration, and inadequate sampling. Informed written consent was given. The usual time-out protocol was  performed immediately prior to the procedure. Using sterile technique and 2% Lidocaine as local anesthetic, under direct ultrasound visualization, a 14 gauge spring-loaded device was used to perform biopsy of an abnormal right axillary lymph node using a inferior lateral to superior medial approach. Two specimens were placed in a container of formalin and 2 specimens into a container saline. Patient tolerated the procedure well with minimal bleeding along the biopsy tract. IMPRESSION: Ultrasound guided biopsy of abnormal right axillary lymph node. No apparent complications. Electronically Signed: By: Marin Olp M.D. On: 06/01/2015 10:28    ASSESSMENT & PLAN:   # MANTLE CELL LYMPHOMA-biopsy-proven [right axillary lymph node/right breast intramammary lymph node]. Patient needs further workup with CBC CMP and LDH and beta-2 microglobulin. For staging purposes will Also need a PET scan/ bone marrow biopsy. Clinically at least stage III.   I discussed with the patient/niece that mantle cell lymphomas are usually incurable. However long remissions are possible; and also multiple treatment options available moving forward. I discussed with the patient the treatment options- including chemotherapy-with rituximab. Given her age/comorbidities- I recommend bendamustine  and rituximab 6 cycles; followed by rituximab maintenance for 2 years. Given her age intensive therapies might be prohibitive.   # After the lengthy discussion, patient is interested in a second opinion/plans to go to [facility]; which is very reasonable. We will get a PET scan here; defer the bone marrow biopsy to evaluation at [facility]. Patient is tentatively follow-up with me in approximately 2 weeks; however plans for therapy will be made only after patient decides after her second opinion at [facility]. Also discussed a Mediport placement.   Thank you Dr. Gilford Rile for allowing me to participate in the care of your pleasant patient. Please do not hesitate to contact me with questions or concerns in the interim.   # 60 minutes face-to-face with the patient discussing the above plan of care; more than 50% of time spent on prognosis/ natural history; counseling and coordination.     Cammie Sickle, MD 06/14/2015 10:10 AM

## 2015-06-14 NOTE — Progress Notes (Signed)
Patient here for new patient evaluation for newly dx mantel cell lymphoma. BX taken from breast. states that she gets short of breath with she "walks and talks." She reports a cough with small amount of thick phlym. patient denies any blood in stool. She has never had a colonoscopy. She has had a bone density test.She has a reported 15-20 lbs since April 2016. She denies any fevers, night sweats, N&V, change in bowel habits. She does not smoke or drink any alcohol. 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

## 2015-06-14 NOTE — Patient Instructions (Signed)
Diagnostic testing   Bone Marrow Aspiration and Bone Marrow Biopsy Bone marrow aspiration and bone marrow biopsy are procedures that are done to diagnose blood disorders. You may also have one of these procedures to help diagnose infections or some types of cancer. Bone marrow is the soft tissue that is inside your bones. Blood cells are produced in bone marrow. For bone marrow aspiration, a sample of tissue in liquid form is removed from inside your bone. For a bone marrow biopsy, a small core of bone marrow tissue is removed. Then these samples are examined under a microscope or tested in a lab. You may need these procedures if you have an abnormal complete blood count (CBC). The aspiration or biopsy sample is usually taken from the top of your hip bone. Sometimes, an aspiration sample is taken from your chest bone (sternum). LET [facility] CARE PROVIDER KNOW ABOUT:  Any allergies you have.  All medicines you are taking, including vitamins, herbs, eye drops, creams, and over-the-counter medicines.  Previous problems you or members of your family have had with the use of anesthetics.  Any blood disorders you have.  Previous surgeries you have had.  Any medical conditions you may have.  Whether you are pregnant or you think that you may be pregnant. RISKS AND COMPLICATIONS Generally, this is a safe procedure. However, problems may occur, including:  Infection.  Bleeding. BEFORE THE PROCEDURE  Ask your health care provider about:  Changing or stopping your regular medicines. This is especially important if you are taking diabetes medicines or blood thinners.  Taking medicines such as aspirin and ibuprofen. These medicines can thin your blood. Do not take these medicines before your procedure if your health care provider instructs you not to.  Plan to have someone take you home after the procedure.  If you go home right after the procedure, plan to have someone with you for 24  hours. PROCEDURE   An IV tube may be inserted into one of your veins.  The injection site will be cleaned with a germ-killing solution (antiseptic).  You will be given one or more of the following:  A medicine that helps you relax (sedative).  A medicine that numbs the area (local anesthetic).  The bone marrow sample will be removed as follows:  For an aspiration, a hollow needle will be inserted through your skin and into your bone. Bone marrow fluid will be drawn up into a syringe.  For a biopsy, your health care provider will use a hollow needle to remove a core of tissue from your bone marrow.  The needle will be removed.  A bandage (dressing) will be placed over the insertion site and taped in place. The procedure may vary among health care providers and hospitals. AFTER THE PROCEDURE  Your blood pressure, heart rate, breathing rate, and blood oxygen level will be monitored often until the medicines you were given have worn off.  Return to your normal activities as directed by your health care provider.   This information is not intended to replace advice given to you by your health care provider. Make sure you discuss any questions you have with your health care provider.   Document Released: 05/22/2004 Document Revised: 10/03/2014 Document Reviewed: 05/10/2014 Elsevier Interactive Patient Education 2016 Else[employer] Inc[employer]       PET Scan A PET scan, also called positron emission tomography, is a test that creates pictures of the inside of the body. A PET scan requires a small dose  of a harmless radioactive material to be injected into a vein. When this material combines with certain substances in the body, it produces tiny particles that can be detected by a scanner and converted into pictures.  The pictures created during a PET scan can be used to study a disease. They are often used to study cancer and cancer therapy. The colors and brightness on the pictures show  different levels of organ and tissue function. For example, cancer tissue appears brighter than normal tissue on a PET scan picture. LET [facility] CARE PROVIDER KNOW ABOUT:   Any allergies you have.  All medicines you are taking, including vitamins, herbs, eye drops, creams, and over-the-counter medicines.  Previous problems you or members of your family have had with the use of anesthetics.  Any blood disorders you have.  Previous surgeries you have had.  Medical conditions you have.  If you are afraid of cramped spaces (claustrophobic). If claustrophobia is a problem, it usually can be relieved with mild sedatives or antianxiety medicines.  If you have trouble staying still for long periods of time. BEFORE THE PROCEDURE   Do not eat or drink anything after midnight on the night before the procedure or as directed by your health care provider.  Take medicines only as directed by your health care provider.  If you have diabetes, ask your health care provider for diet guidelines to control sugar (glucose) levels on the day of the test. PROCEDURE   A small amount of radioactive material will be injected into a vein. The test will begin 30-60 minutes after the injection, when the material has traveled around your body.  You will lie on a cushioned table, and the table will be moved through the center of a machine that looks like a large donut. It will take about 30-60 minutes for the machine to produce pictures of your body. You will need to stay still during this time. AFTER THE PROCEDURE  You may resume your normal diet and activities.  Drink several 8 oz glasses of water following the test to flush the radioactive material out of your body.   This information is not intended to replace advice given to you by your health care provider. Make sure you discuss any questions you have with your health care provider.   Document Released: 11/23/2002 Document Revised: 06/09/2014  Document Reviewed: 08/31/2013 Elsevier Interactive Patient Education 2016 [place] An implanted port is a type of central line that is placed under the skin. Central lines are used to provide IV access when treatment or nutrition needs to be given through a person's veins. Implanted ports are used for long-term IV access. An implanted port may be placed because:   You need IV medicine that would be irritating to the small veins in your hands or arms.   You need long-term IV medicines, such as antibiotics.   You need IV nutrition for a long period.   You need frequent blood draws for lab tests.   You need dialysis.  Implanted ports are usually placed in the chest area, but they can also be placed in the upper arm, the abdomen, or the leg. An implanted port has two main parts:   Reservoir. The reservoir is round and will appear as a small, raised area under your skin. The reservoir is the part where a needle is inserted to give medicines or draw blood.   Catheter. The catheter  is a thin, flexible tube that extends from the reservoir. The catheter is placed into a large vein. Medicine that is inserted into the reservoir goes into the catheter and then into the vein.  HOW WILL I CARE FOR MY INCISION SITE? Do not get the incision site wet. Bathe or shower as directed by your health care provider.  HOW IS MY PORT ACCESSED? Special steps must be taken to access the port:   Before the port is accessed, a numbing cream can be placed on the skin. This helps numb the skin over the port site.   Your health care provider uses a sterile technique to access the port.  Your health care provider must put on a mask and sterile gloves.  The skin over your port is cleaned carefully with an antiseptic and allowed to dry.  The port is gently pinched between sterile gloves, and a needle is inserted into the port.  Only "non-coring" port needles should be  used to access the port. Once the port is accessed, a blood return should be checked. This helps ensure that the port is in the vein and is not clogged.   If your port needs to remain accessed for a constant infusion, a clear (transparent) bandage will be placed over the needle site. The bandage and needle will need to be changed every week, or as directed by your health care provider.   Keep the bandage covering the needle clean and dry. Do not get it wet. Follow your health care provider's instructions on how to take a shower or bath while the port is accessed.   If your port does not need to stay accessed, no bandage is needed over the port.  WHAT IS FLUSHING? Flushing helps keep the port from getting clogged. Follow your health care provider's instructions on how and when to flush the port. Ports are usually flushed with saline solution or a medicine called heparin. The need for flushing will depend on how the port is used.   If the port is used for intermittent medicines or blood draws, the port will need to be flushed:   After medicines have been given.   After blood has been drawn.   As part of routine maintenance.   If a constant infusion is running, the port may not need to be flushed.  HOW LONG WILL MY PORT STAY IMPLANTED? The port can stay in for as long as your health care provider thinks it is needed. When it is time for the port to come out, surgery will be done to remove it. The procedure is similar to the one performed when the port was put in.  WHEN SHOULD I SEEK IMMEDIATE MEDICAL CARE? When you have an implanted port, you should seek immediate medical care if:   You notice a bad smell coming from the incision site.   You have swelling, redness, or drainage at the incision site.   You have more swelling or pain at the port site or the surrounding area.   You have a fever that is not controlled with medicine.   This information is not intended to replace  advice given to you by your health care provider. Make sure you discuss any questions you have with your health care provider.   Document Released: 05/19/2005 Document Revised: 03/09/2013 Document Reviewed: 01/24/2013 Elsevier Interactive Patient Education 2016 [place] [employer] Cell Lymphoma    Bendamustine Injection What  is this medicine? BENDAMUSTINE (BEN da MUS teen) is a chemotherapy drug. It is used to treat chronic lymphocytic leukemia and non-Hodgkin lymphoma. This medicine may be used for other purposes; ask your health care provider or pharmacist if you have questions. What should I tell my health care provider before I take this medicine? They need to know if you have any of these conditions: -infection (especially a virus infection such as chickenpox, cold sores, or herpes) -kidney disease -liver disease -an unusual or allergic reaction to bendamustine, mannitol, other medicines, foods, dyes, or preservatives -pregnant or trying to get pregnant -breast-feeding How should I use this medicine? This medicine is for infusion into a vein. It is given by a health care professional in a hospital or clinic setting. Talk to your pediatrician regarding the use of this medicine in children. Special care may be needed. Overdosage: If you think you have taken too much of this medicine contact a poison control center or emergency room at once. NOTE: This medicine is only for you. Do not share this medicine with others. What if I miss a dose? It is important not to miss your dose. Call your doctor or health care professional if you are unable to keep an appointment. What may interact with this medicine? Do not take this medicine with any of the following medications: -clozapine This medicine may also interact with the following medications: -atazanavir -cimetidine -ciprofloxacin -enoxacin -fluvoxamine -medicines for seizures like  carbamazepine and phenobarbital -mexiletine -rifampin -tacrine -thiabendazole -zileuton This list may not describe all possible interactions. Give your health care provider a list of all the medicines, herbs, non-prescription drugs, or dietary supplements you use. Also tell them if you smoke, drink alcohol, or use illegal drugs. Some items may interact with your medicine. What should I watch for while using this medicine? This drug may make you feel generally unwell. This is not uncommon, as chemotherapy can affect healthy cells as well as cancer cells. Report any side effects. Continue your course of treatment even though you feel ill unless your doctor tells you to stop. You may need blood work done while you are taking this medicine. Call your doctor or health care professional for advice if you get a fever, chills or sore throat, or other symptoms of a cold or flu. Do not treat yourself. This drug decreases your body's ability to fight infections. Try to avoid being around people who are sick. This medicine may increase your risk to bruise or bleed. Call your doctor or health care professional if you notice any unusual bleeding. Talk to your doctor about your risk of cancer. You may be more at risk for certain types of cancers if you take this medicine. Do not become pregnant while taking this medicine or for 3 months after stopping it. Women should inform their doctor if they wish to become pregnant or think they might be pregnant. Men should not father a child while taking this medicine and for 3 months after stopping it.There is a potential for serious side effects to an unborn child. Talk to your health care professional or pharmacist for more information. Do not breast-feed an infant while taking this medicine. This medicine may interfere with the ability to have a child. You should talk with your doctor or health care professional if you are concerned about your fertility. What side effects  may I notice from receiving this medicine? Side effects that you should report to your doctor or health care professional as soon as  possible: -allergic reactions like skin rash, itching or hives, swelling of the face, lips, or tongue -low blood counts - this medicine may decrease the number of white blood cells, red blood cells and platelets. You may be at increased risk for infections and bleeding. -redness, blistering, peeling or loosening of the skin, including inside the mouth -signs of infection - fever or chills, cough, sore throat, pain or difficulty passing urine -signs of decreased platelets or bleeding - bruising, pinpoint red spots on the skin, black, tarry stools, blood in the urine -signs of decreased red blood cells - unusually weak or tired, fainting spells, lightheadedness -signs and symptoms of kidney injury like trouble passing urine or change in the amount of urine Side effects that usually do not require medical attention (report to your doctor or health care professional if they continue or are bothersome): -constipation -decreased appetite -diarrhea -headache -mouth sores -nausea/vomiting -tiredness This list may not describe all possible side effects. Call your doctor for medical advice about side effects. You may report side effects to FDA at 1-800-FDA-1088. Where should I keep my medicine? This drug is given in a hospital or clinic and will not be stored at home. NOTE: This sheet is a summary. It may not cover all possible information. If you have questions about this medicine, talk to your doctor, pharmacist, or health care provider.    2016, Elsevier/[employer] Standard. (2014-05-12 13:53:28)        Rituximab injection What is this medicine? RITUXIMAB (ri TUX i mab) is a monoclonal antibody. It is used commonly to treat non-Hodgkin lymphoma and other conditions. It is also used to treat rheumatoid arthritis (RA). In RA, this medicine slows the inflammatory  process and help reduce joint pain and swelling. This medicine is often used with other cancer or arthritis medications. This medicine may be used for other purposes; ask your health care provider or pharmacist if you have questions. What should I tell my health care provider before I take this medicine? They need to know if you have any of these conditions: -blood disorders -heart disease -history of hepatitis B -infection (especially a virus infection such as chickenpox, cold sores, or herpes) -irregular heartbeat -kidney disease -lung or breathing disease, like asthma -lupus -an unusual or allergic reaction to rituximab, mouse proteins, other medicines, foods, dyes, or preservatives -pregnant or trying to get pregnant -breast-feeding How should I use this medicine? This medicine is for infusion into a vein. It is administered in a hospital or clinic by a specially trained health care professional. A special MedGuide will be given to you by the pharmacist with each prescription and refill. Be sure to read this information carefully each time. Talk to your pediatrician regarding the use of this medicine in children. This medicine is not approved for use in children. Overdosage: If you think you have taken too much of this medicine contact a poison control center or emergency room at once. NOTE: This medicine is only for you. Do not share this medicine with others. What if I miss a dose? It is important not to miss a dose. Call your doctor or health care professional if you are unable to keep an appointment. What may interact with this medicine? -cisplatin -medicines for blood pressure -some other medicines for arthritis -vaccines This list may not describe all possible interactions. Give your health care provider a list of all the medicines, herbs, non-prescription drugs, or dietary supplements you use. Also tell them if you smoke, drink alcohol, or  use illegal drugs. Some items may  interact with your medicine. What should I watch for while using this medicine? Report any side effects that you notice during your treatment right away, such as changes in your breathing, fever, chills, dizziness or lightheadedness. These effects are more common with the first dose. Visit your prescriber or health care professional for checks on your progress. You will need to have regular blood work. Report any other side effects. The side effects of this medicine can continue after you finish your treatment. Continue your course of treatment even though you feel ill unless your doctor tells you to stop. Call your doctor or health care professional for advice if you get a fever, chills or sore throat, or other symptoms of a cold or flu. Do not treat yourself. This drug decreases your body's ability to fight infections. Try to avoid being around people who are sick. This medicine may increase your risk to bruise or bleed. Call your doctor or health care professional if you notice any unusual bleeding. Be careful brushing and flossing your teeth or using a toothpick because you may get an infection or bleed more easily. If you have any dental work done, tell your dentist you are receiving this medicine. Avoid taking products that contain aspirin, acetaminophen, ibuprofen, naproxen, or ketoprofen unless instructed by your doctor. These medicines may hide a fever. Do not become pregnant while taking this medicine. Women should inform their doctor if they wish to become pregnant or think they might be pregnant. There is a potential for serious side effects to an unborn child. Talk to your health care professional or pharmacist for more information. Do not breast-feed an infant while taking this medicine. What side effects may I notice from receiving this medicine? Side effects that you should report to your doctor or health care professional as soon as possible: -allergic reactions like skin rash, itching or  hives, swelling of the face, lips, or tongue -low blood counts - this medicine may decrease the number of white blood cells, red blood cells and platelets. You may be at increased risk for infections and bleeding. -signs of infection - fever or chills, cough, sore throat, pain or difficulty passing urine -signs of decreased platelets or bleeding - bruising, pinpoint red spots on the skin, black, tarry stools, blood in the urine -signs of decreased red blood cells - unusually weak or tired, fainting spells, lightheadedness -breathing problems -confused, not responsive -chest pain -fast, irregular heartbeat -feeling faint or lightheaded, falls -mouth sores -redness, blistering, peeling or loosening of the skin, including inside the mouth -stomach pain -swelling of the ankles, feet, or hands -trouble passing urine or change in the amount of urine Side effects that usually do not require medical attention (report to your doctor or other health care professional if they continue or are bothersome): -anxiety -headache -loss of appetite -muscle aches -nausea -night sweats This list may not describe all possible side effects. Call your doctor for medical advice about side effects. You may report side effects to FDA at 1-800-FDA-1088. Where should I keep my medicine? This drug is given in a hospital or clinic and will not be stored at home. NOTE: This sheet is a summary. It may not cover all possible information. If you have questions about this medicine, talk to your doctor, pharmacist, or health care provider.    2016, Elsevier/[employer] Standard. (2014-07-26 22:30:56)

## 2015-06-15 ENCOUNTER — Telehealth: Payer: Self-pay | Admitting: *Deleted

## 2015-06-15 LAB — HEPATITIS B SURFACE ANTIGEN: Hepatitis B Surface Ag: NEGATIVE

## 2015-06-15 LAB — MULTIPLE MYELOMA PANEL, SERUM
Albumin SerPl Elph-Mcnc: 3.3 g/dL (ref 2.9–4.4)
Albumin/Glob SerPl: 1.1 (ref 0.7–1.7)
Alpha 1: 0.3 g/dL (ref 0.0–0.4)
Alpha2 Glob SerPl Elph-Mcnc: 0.6 g/dL (ref 0.4–1.0)
B-Globulin SerPl Elph-Mcnc: 1.1 g/dL (ref 0.7–1.3)
Gamma Glob SerPl Elph-Mcnc: 1.3 g/dL (ref 0.4–1.8)
Globulin, Total: 3.2 g/dL (ref 2.2–3.9)
IgA: 307 mg/dL (ref 64–422)
IgG (Immunoglobin G), Serum: 1383 mg/dL (ref 700–1600)
IgM, Serum: 138 mg/dL (ref 26–217)
Total Protein ELP: 6.5 g/dL (ref 6.0–8.5)

## 2015-06-15 LAB — BETA 2 MICROGLOBULIN, SERUM: Beta-2 Microglobulin: 2.8 mg/L — ABNORMAL HIGH (ref 0.6–2.4)

## 2015-06-15 NOTE — Telephone Encounter (Signed)
Received a call from [facility] lab stating that pt needs CBC redrawn (did not receive enough blood to run labs). However, pt had labs drawn yesterday from outside office. No need to recollect. Dr Gilford Rile aware also.

## 2015-06-20 ENCOUNTER — Ambulatory Visit
Admission: RE | Admit: 2015-06-20 | Discharge: 2015-06-20 | Disposition: A | Discharge status: Home / Self Care | Payer: Medicare Other | Source: Ambulatory Visit | Attending: Internal Medicine | Admitting: Internal Medicine

## 2015-06-20 DIAGNOSIS — R590 Localized enlarged lymph nodes: Secondary | ICD-10-CM | POA: Diagnosis not present

## 2015-06-20 DIAGNOSIS — C8311 Mantle cell lymphoma, lymph nodes of head, face, and neck: Secondary | ICD-10-CM | POA: Diagnosis not present

## 2015-06-20 DIAGNOSIS — C8318 Mantle cell lymphoma, lymph nodes of multiple sites: Secondary | ICD-10-CM | POA: Diagnosis not present

## 2015-06-20 DIAGNOSIS — Z8572 Personal history of non-Hodgkin lymphomas: Secondary | ICD-10-CM | POA: Insufficient documentation

## 2015-06-20 DIAGNOSIS — C8313 Mantle cell lymphoma, intra-abdominal lymph nodes: Secondary | ICD-10-CM | POA: Diagnosis not present

## 2015-06-20 DIAGNOSIS — Z0189 Encounter for other specified special examinations: Secondary | ICD-10-CM | POA: Insufficient documentation

## 2015-06-20 DIAGNOSIS — R634 Abnormal weight loss: Secondary | ICD-10-CM | POA: Diagnosis not present

## 2015-06-20 DIAGNOSIS — M0579 Rheumatoid arthritis with rheumatoid factor of multiple sites without organ or systems involvement: Secondary | ICD-10-CM | POA: Diagnosis not present

## 2015-06-20 LAB — GLUCOSE, CAPILLARY: Glucose-Capillary: 80 mg/dL (ref 65–99)

## 2015-06-20 MED ORDER — FLUDEOXYGLUCOSE F - 18 (FDG) INJECTION
12.4600 | Freq: Once | INTRAVENOUS | Status: AC | PRN
  Administered 2015-06-20: 12.46 via INTRAVENOUS

## 2015-06-22 ENCOUNTER — Inpatient Hospital Stay (HOSPITAL_BASED_OUTPATIENT_CLINIC_OR_DEPARTMENT_OTHER): Payer: Medicare Other | Admitting: Internal Medicine

## 2015-06-22 VITALS — BP 138/82 | HR 75 | Temp 97.7°F | Resp 18 | Wt 202.2 lb

## 2015-06-22 DIAGNOSIS — R6881 Early satiety: Secondary | ICD-10-CM

## 2015-06-22 DIAGNOSIS — Z79899 Other long term (current) drug therapy: Secondary | ICD-10-CM | POA: Diagnosis not present

## 2015-06-22 DIAGNOSIS — R0602 Shortness of breath: Secondary | ICD-10-CM | POA: Diagnosis not present

## 2015-06-22 DIAGNOSIS — E785 Hyperlipidemia, unspecified: Secondary | ICD-10-CM

## 2015-06-22 DIAGNOSIS — R05 Cough: Secondary | ICD-10-CM | POA: Diagnosis not present

## 2015-06-22 DIAGNOSIS — R599 Enlarged lymph nodes, unspecified: Secondary | ICD-10-CM

## 2015-06-22 DIAGNOSIS — M069 Rheumatoid arthritis, unspecified: Secondary | ICD-10-CM | POA: Diagnosis not present

## 2015-06-22 DIAGNOSIS — R634 Abnormal weight loss: Secondary | ICD-10-CM

## 2015-06-22 DIAGNOSIS — R630 Anorexia: Secondary | ICD-10-CM

## 2015-06-22 DIAGNOSIS — C8310 Mantle cell lymphoma, unspecified site: Secondary | ICD-10-CM | POA: Diagnosis not present

## 2015-06-22 DIAGNOSIS — K219 Gastro-esophageal reflux disease without esophagitis: Secondary | ICD-10-CM | POA: Diagnosis not present

## 2015-06-22 DIAGNOSIS — C8314 Mantle cell lymphoma, lymph nodes of axilla and upper limb: Secondary | ICD-10-CM

## 2015-06-22 MED ORDER — LEVOFLOXACIN 500 MG PO TABS: 500.0000 mg | ORAL_TABLET | Freq: Every day | ORAL | Status: DC

## 2015-06-22 NOTE — Progress Notes (Signed)
Patient here to discuss plan of care.  States she would prefer medication over surgery.

## 2015-06-22 NOTE — Progress Notes (Signed)
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   Patient continues to complain of weight loss even though her appetite is good. She continues to deny any fevers or night sweats. She does have intermittent cough; which is nonproductive.   Patient denies any ongoing headaches; denies any vision disturbance or neck pain; or falls. Denies any neuropathy. She is chronic arthritis.  ROS: A complete 10 point review of system is done which is negative except mentioned above in history of present illness  MEDICAL HISTORY:  Past Medical History  Diagnosis Date  . Arthritis   . Headache(784.0)   . GERD (gastroesophageal reflux disease)   . Hyperlipidemia     hx  . Lymphoma, mantle cell ([place]) 06/01/2015    bx of lymph node in right breast  . Lymphadenopathy of head and neck 01/2015    see on Thyroid ultrasound    SURGICAL HISTORY: Past Surgical History  Procedure Laterality Date  . Cardiac catheterization  08/2004    [facility]    SOCIAL HISTORY: Social History   Social History  . Marital Status: Married    Spouse Name: N/A  . Number of Children: N/A  . Years of Education: N/A   Occupational History  . Not on file.   Social History Main Topics  . Smoking status: Never Smoker   . Smokeless tobacco: Never Used  . Alcohol Use: No  . Drug Use: No  . Sexual Activity: Not on file   Other Topics Concern  . Not on  file   Social History Narrative   Lives in [place]. Works as Psychiatrist.    FAMILY HISTORY: Family History  Problem Relation Age of Onset  . Diabetes Mother   . Cholelithiasis Mother   . Hypertension Sister   . Diabetes Sister   . Arthritis Brother     ALLERGIES:  has No Known Allergies.  MEDICATIONS:  Current Outpatient Prescriptions  Medication Sig Dispense Refill  . Calcium Carbonate-Vit D-Min (CALTRATE 600+D PLUS) 600-800 MG-UNIT CHEW Chew 1 tablet by mouth 2 (two) times daily. 60 tablet 6  . fluticasone (FLONASE) 50 MCG/ACT nasal spray Place 1 spray into both nostrils 2 (two) times daily.  4  . folic acid (FOLVITE) 1 MG tablet Take 1 mg by mouth.    . meloxicam (MOBIC) 15 MG tablet TAKE 1 TABLET (15 MG TOTAL) BY MOUTH DAILY WITH MEAL 30 tablet 3  . Multiple Vitamins-Minerals (MULTIVITAMIN GUMMIES ADULT) CHEW Chew by mouth.    . NON FORMULARY Herbalife-take one tablet every morning and every evening    . omeprazole (PRILOSEC) 40 MG capsule TAKE ONE CAPSULE EVERY DAY 30 capsule 5  . traMADol (ULTRAM) 50 MG tablet TAKE 1 TABLET BY MOUTH EVERY 8 HOURS AS NEEDED 90 tablet 1  . vitamin E 400 UNIT capsule Take 400 Units by mouth daily.     No current facility-administered medications for this visit.      [name]  PHYSICAL EXAMINATION: ECOG PERFORMANCE STATUS: 0 - Asymptomatic  Filed Vitals:   06/22/15 0836  BP: 138/82  Pulse: 75  Temp: 97.7 F (36.5 C)  Resp: 18   Filed Weights   06/22/15 0836  Weight: 202 lb 2.6 oz (91.7 kg)    GENERAL: Well-nourished well-developed; Alert, no distress and comfortable.  Accompanied by her sister. She is able to sit on the exam table without any significant help.  LABORATORY DATA:  I have reviewed the data as listed Lab Results  Component Value Date   WBC 6.8 06/14/2015   HGB 12.6 06/14/2015   HCT 39.0 06/14/2015   MCV 88.0 06/14/2015   PLT 213 06/14/2015    Recent Labs  06/14/15 0942  NA 136  K 3.3*  CL 105  CO2  27  GLUCOSE 101*  BUN 12  CREATININE 0.62  CALCIUM 8.9  GFRNONAA >60  GFRAA >60  PROT 7.1  ALBUMIN 3.8  AST 25  ALT 16  ALKPHOS 66  BILITOT 0.7    RADIOGRAPHIC STUDIES: I have personally reviewed the radiological images as listed and agreed with the findings in the report. Nm Pet Image Initial (pi) Skull Base To Thigh  06/20/2015  CLINICAL DATA:  Initial treatment strategy for mantle cell lymphoma. EXAM: NUCLEAR MEDICINE PET SKULL BASE TO THIGH TECHNIQUE: 12.5 mCi F-18 FDG was injected intravenously. Full-ring PET imaging was performed from the skull base to thigh after the radiotracer. CT data was obtained and used for attenuation correction and anatomic localization. FASTING BLOOD GLUCOSE:  Value: 80 mg/dl COMPARISON:  None. FINDINGS: NECK There is hypermetabolism associated with the oro/nasopharynx with possible soft tissue thickening on CT. Parotid glands and submandibular glands are also hypermetabolic. Hypermetabolic lymph nodes are seen predominantly in the level 2 stations bilaterally. Index 1.0 cm short axis lymph node on the right (CT image 36) has an SUV max of 6.5. CT images show no acute findings. Mucosal thickening in the right maxillary sinus. CHEST There are hypermetabolic mediastinal, hilar and axillary lymph nodes. Index low right paratracheal lymph node measures 1.4 cm (CT image 81) with an SUV max of 4.2. Left hilar hypermetabolism has an SUV max of 4.9. A discrete lymph node is not well seen on CT, possibly due to lack of IV contrast. Bilateral subpectoral and axillary adenopathy is hypermetabolic with an index 8 mm lymph node on the left (CT image 79) with an SUV max of 4.9. Areas of subpleural consolidation in the left upper, left lower and right lower lobes show mild hypermetabolism. CT images show no pericardial or pleural effusion. ABDOMEN/PELVIS Hypermetabolic lymph nodes are seen in the abdominal peritoneal ligaments bilaterally. Discrete measurements of lymph nodes in  the porta hepatis region is difficult without IV contrast. SUV max in this region is approximately 5.9. Hypermetabolic lymph nodes are seen along the iliac chains and inguinal regions bilaterally. Proximal left common iliac lymph node measures 1.2 cm (CT image 175) with an SUV max of 4.0. Index left inguinal lymph node measures 1.3 cm (CT image 219) with an SUV max of 4.9. Liver, gallbladder, adrenal glands, kidneys, spleen, pancreas, stomach and bowel are otherwise grossly unremarkable. No free fluid. SKELETON No abnormal osseous hypermetabolism. IMPRESSION: 1. Hypermetabolic adenopathy in the neck, chest, abdomen and pelvis, consistent with the given history of lymphoma. 2. Oral/nasopharyngeal hypermetabolism can be seen in the setting of lymphoma. Please correlate clinically. 3. Hypermetabolism within subpleural consolidation in the left upper, left lower and right lower lobes may be infectious or  inflammatory in etiology. Pulmonary lymphoma is not excluded Electronically Signed   By: Lorin Picket M.D.   On: 06/20/2015 11:19   Mm Digital Diagnostic Unilat R  06/01/2015  CLINICAL DATA:  Patient is post ultrasound-guided core needle biopsy of a 1.3 cm mass over the 12:30 position of the right breast 10 cm from the nipple as well as biopsy of an abnormal right axillary lymph node. EXAM: DIAGNOSTIC RIGHT MAMMOGRAM POST ULTRASOUND BIOPSY COMPARISON:  Previous exam(s). FINDINGS: Mammographic images were obtained following ultrasound guided biopsy of the 1.3 cm mass over the 12:30 position of the right breast 10 cm from the nipple as well as biopsy of a right axillary lymph node. Exam demonstrates satisfactory placement of the coil shaped metallic clip adjacent the biopsied mass as the clip lies 5 mm anterior inferior to the biopsied mass. IMPRESSION: Satisfactory clip placement post ultrasound-guided core biopsy right breast mass. Final Assessment: Post Procedure Mammograms for Marker Placement Electronically  Signed   By: Marin Olp M.D.   On: 06/01/2015 10:16   [place] Rt Breast Bx W Loc Dev 1st Lesion Img Bx Spec US Guide  06/13/2015  ADDENDUM REPORT: 06/13/2015 13:44 ADDENDUM: Pathology of the right breast biopsy and right axillary lymph node biopsy both revealed MANTLE CELL LYMPHOMA. These findings were communicated to Drs. Raylene Everts on 06/08/2015 by the pathologist. This was found to be concordant by Dr. Derrel Nip. Recommendations:  Recommend oncology referral. Multiple attempts were made by Jetta Lout, [place] [facility]) and Dr. Derrel Nip to reach the patient between January 3-6, 2017 for a biopsy site check up and also to inform the patient of a delay in the pathology results, but were unsuccessful. Jetta Lout, RRA spoke with Dr. Ronette Deter, the referring physician on 06/13/15. Dr. Gilford Rile stated the patient has an appointment today at 1:00 PM to discuss the biopsy results. Dr. Gilford Rile will make the oncology referral. Addendum by Jetta Lout, [place] on 06/13/15. Electronically Signed   By: Marin Olp M.D.   On: 06/13/2015 13:44  06/13/2015  CLINICAL DATA:  Patient presents for ultrasound-guided core needle biopsy of a 1.3 cm mass over the 12:30 position of the right breast 10 cm from the nipple. EXAM: ULTRASOUND GUIDED right BREAST CORE NEEDLE BIOPSY COMPARISON:  Previous exam(s). PROCEDURE: I met with the patient and we discussed the procedure of ultrasound-guided biopsy, including benefits and alternatives. We discussed the high likelihood of a successful procedure. We discussed the risks of the procedure including infection, bleeding, tissue injury, clip migration, and inadequate sampling. Informed written consent was given. The usual time-out protocol was performed immediately prior to the procedure. Using sterile technique and 2% Lidocaine as local anesthetic, under direct ultrasound visualization, a 12 gauge vacuum-assisted device was used to perform biopsy of the targeted mass at the 12:30  positionusing a inferior to superior approach. At the conclusion of the procedure, a coil shaped tissue marker clip was deployed into the biopsy cavity. Follow-up 2-view mammogram was performed and dictated separately. IMPRESSION: Ultrasound-guided biopsy of a right breast mass. No apparent complications. Electronically Signed: By: Marin Olp M.D. On: 06/01/2015 10:18   [place] Rt Breast Bx W Loc Dev Ea Add Lesion Img Bx Spec US Guide  06/13/2015  ADDENDUM REPORT: 06/13/2015 13:51 ADDENDUM: Pathology of the right breast mass biopsy and right axillary lymph node biopsy both revealed MANTLE CELL LYMPHOMA. Per the pathology report, these findings were communicated to Drs. Raylene Everts on 06/08/2015 by the pathologist. This patholgy finding  was found to be concordant by Dr. Derrel Nip. Recommendations:  Recommend oncology referral. Multiple attempts were made by Jetta Lout, [facility]) and Dr. Derrel Nip from 1/3 - 06/08/15 for a post biopsy check up and to inform the patient of a delay in the results but were unsuccessful. Jetta Lout, RRA spoke with Dr. Ronette Deter on the morning of 06/13/15. Dr. Gilford Rile stated the patient has an appointment to see her today at 1:00 PM for the biopsy results. She will make the oncology referral. Addendum by Jetta Lout, RRA on 06/13/15. Electronically Signed   By: Marin Olp M.D.   On: 06/13/2015 13:51  06/13/2015  CLINICAL DATA:  Patient presents for ultrasound-guided needle biopsy of the abnormal right axillary lymph node. Patient was noted to have bilateral abnormal axillary lymph nodes with thickened cortices on recent mammogram. Known rheumatoid arthritis. EXAM: ULTRASOUND GUIDED CORE NEEDLE BIOPSY OF A RIGHT AXILLARY NODE COMPARISON:  Previous exam(s). FINDINGS: I met with the patient and we discussed the procedure of ultrasound-guided biopsy, including benefits and alternatives. We discussed the high likelihood of a successful procedure. We discussed the  risks of the procedure, including infection, bleeding, tissue injury, clip migration, and inadequate sampling. Informed written consent was given. The usual time-out protocol was performed immediately prior to the procedure. Using sterile technique and 2% Lidocaine as local anesthetic, under direct ultrasound visualization, a 14 gauge spring-loaded device was used to perform biopsy of an abnormal right axillary lymph node using a inferior lateral to superior medial approach. Two specimens were placed in a container of formalin and 2 specimens into a container saline. Patient tolerated the procedure well with minimal bleeding along the biopsy tract. IMPRESSION: Ultrasound guided biopsy of abnormal right axillary lymph node. No apparent complications. Electronically Signed: By: Marin Olp M.D. On: 06/01/2015 10:28    ASSESSMENT & PLAN:   # MANTLE CELL LYMPHOMA-biopsy-proven [right axillary lymph node/right breast intramammary lymph node]. MIPI index-5/intermediate risk  [mostly based on age]. At least clinical stage IIIB. Patient will need a bone marrow biopsy for further evaluation.  # I discussed with the patient/sister that mantle cell lymphomas are usually incurable. However long remissions are possible; I discussed with the patient the treatment options- including chemotherapy-with rituximab. Given her age/comorbidities- I recommend bendamustine and rituximab 6 cycles. This could be followed by consolidation with autologous stem cell transplant or maintenance rituximab. .   # Patient has an appointment at [facility] on the 23rd; I have given my card to the patient and encouraged the patient to have the physician call me to discuss the above plan of care.   # I also discussed the potential side effects of chemotherapy of the bendamustine rituximab- including but not limited to infusion reactions; nausea vomiting skin rash; reactivation of infections especially related infections etc. She would  benefit from prophylactic Neulasta  # Given the cough/left lower lobe consolidation noted in the PET scan- recommend a trial of Levaquin for 10 days.  # Absence of any neurologic signs and symptoms- I would recommend holding off lumbar puncture/CSF evaluation.   # Patient will tentatively follow-up with me in 1 week; unless plans change at [facility]. She understands that the next step would be a bone marrow biopsy.  I reviewed the images of the scan myself; reviewed the images with the patient in detail. A copy of the report was given.   # 40 minutes face-to-face with the patient discussing the above plan of care; more than 50% of time  spent on prognosis/ natural history; counseling and coordination.     Cammie Sickle, MD 06/22/2015 9:05 AM

## 2015-06-25 DIAGNOSIS — E669 Obesity, unspecified: Secondary | ICD-10-CM | POA: Diagnosis not present

## 2015-06-25 DIAGNOSIS — Z23 Encounter for immunization: Secondary | ICD-10-CM | POA: Diagnosis not present

## 2015-06-25 DIAGNOSIS — M810 Age-related osteoporosis without current pathological fracture: Secondary | ICD-10-CM | POA: Diagnosis not present

## 2015-06-25 DIAGNOSIS — K219 Gastro-esophageal reflux disease without esophagitis: Secondary | ICD-10-CM | POA: Diagnosis not present

## 2015-06-25 DIAGNOSIS — C8318 Mantle cell lymphoma, lymph nodes of multiple sites: Secondary | ICD-10-CM | POA: Diagnosis not present

## 2015-06-25 DIAGNOSIS — Z6832 Body mass index (BMI) 32.0-32.9, adult: Secondary | ICD-10-CM | POA: Diagnosis not present

## 2015-06-25 DIAGNOSIS — C8310 Mantle cell lymphoma, unspecified site: Secondary | ICD-10-CM | POA: Diagnosis not present

## 2015-06-25 DIAGNOSIS — Z79899 Other long term (current) drug therapy: Secondary | ICD-10-CM | POA: Diagnosis not present

## 2015-06-25 DIAGNOSIS — N63 Unspecified lump in breast: Secondary | ICD-10-CM | POA: Diagnosis not present

## 2015-06-25 DIAGNOSIS — M0579 Rheumatoid arthritis with rheumatoid factor of multiple sites without organ or systems involvement: Secondary | ICD-10-CM | POA: Diagnosis not present

## 2015-06-25 DIAGNOSIS — R918 Other nonspecific abnormal finding of lung field: Secondary | ICD-10-CM | POA: Diagnosis not present

## 2015-06-25 DIAGNOSIS — M1990 Unspecified osteoarthritis, unspecified site: Secondary | ICD-10-CM | POA: Diagnosis not present

## 2015-06-25 DIAGNOSIS — I447 Left bundle-branch block, unspecified: Secondary | ICD-10-CM | POA: Diagnosis not present

## 2015-06-28 ENCOUNTER — Other Ambulatory Visit: Payer: Self-pay | Admitting: Internal Medicine

## 2015-06-28 ENCOUNTER — Telehealth: Payer: Self-pay | Admitting: Internal Medicine

## 2015-06-28 DIAGNOSIS — C8313 Mantle cell lymphoma, intra-abdominal lymph nodes: Secondary | ICD-10-CM

## 2015-06-28 LAB — SURGICAL PATHOLOGY

## 2015-06-28 NOTE — Telephone Encounter (Signed)
I reviewed the note from [facility]. Plan bone marrow biopsy/Mediport placement. Please scheduleBMBx/refer to Dr. Faith Rogue Re: Mediport placement.

## 2015-06-28 NOTE — Telephone Encounter (Signed)
Spoke with patient. She is agreeable to have the bone marrow biopsy and port a cath set up here at [facility]. She will keep her apt tomorrow. I asked the patient to bring all of her medication bottles to ensure that we had the most updated list of medications that she is on (prior to these procedures). She understands that both of these procedures are performed in special procedures. The CT scan will be performed in IR and the port a cath placement in vascular department. Verbal teach back process performed. I will arrange these procedures and will hopefully have the date of these procedures available when pt comes to the clinic tomorrow for md apt.

## 2015-06-29 ENCOUNTER — Encounter: Payer: Self-pay | Admitting: *Deleted

## 2015-06-29 ENCOUNTER — Other Ambulatory Visit: Payer: Self-pay | Admitting: *Deleted

## 2015-06-29 ENCOUNTER — Inpatient Hospital Stay (HOSPITAL_BASED_OUTPATIENT_CLINIC_OR_DEPARTMENT_OTHER): Payer: Medicare Other | Admitting: Internal Medicine

## 2015-06-29 VITALS — BP 130/74 | HR 76 | Temp 97.8°F | Resp 18 | Wt 198.6 lb

## 2015-06-29 DIAGNOSIS — Z79899 Other long term (current) drug therapy: Secondary | ICD-10-CM

## 2015-06-29 DIAGNOSIS — C8310 Mantle cell lymphoma, unspecified site: Secondary | ICD-10-CM

## 2015-06-29 DIAGNOSIS — R05 Cough: Secondary | ICD-10-CM | POA: Diagnosis not present

## 2015-06-29 DIAGNOSIS — K219 Gastro-esophageal reflux disease without esophagitis: Secondary | ICD-10-CM

## 2015-06-29 DIAGNOSIS — M069 Rheumatoid arthritis, unspecified: Secondary | ICD-10-CM

## 2015-06-29 DIAGNOSIS — R0602 Shortness of breath: Secondary | ICD-10-CM

## 2015-06-29 DIAGNOSIS — C8313 Mantle cell lymphoma, intra-abdominal lymph nodes: Secondary | ICD-10-CM

## 2015-06-29 DIAGNOSIS — R634 Abnormal weight loss: Secondary | ICD-10-CM

## 2015-06-29 DIAGNOSIS — R6881 Early satiety: Secondary | ICD-10-CM | POA: Diagnosis not present

## 2015-06-29 DIAGNOSIS — R599 Enlarged lymph nodes, unspecified: Secondary | ICD-10-CM | POA: Diagnosis not present

## 2015-06-29 DIAGNOSIS — R630 Anorexia: Secondary | ICD-10-CM

## 2015-06-29 DIAGNOSIS — E785 Hyperlipidemia, unspecified: Secondary | ICD-10-CM | POA: Diagnosis not present

## 2015-06-29 MED ORDER — LIDOCAINE-PRILOCAINE 2.5-2.5 % EX CREA: 1.0000 "application " | TOPICAL_CREAM | CUTANEOUS | Status: DC | PRN

## 2015-06-29 NOTE — Patient Instructions (Addendum)
The port a cath placement will be scheduled for Wednesday, July 04, 2015 and will be performed by Dr. Leotis Pain, a vascular surgeon, in special procedures.  The procedure will start at 1030 am. [facility] will contact the patient with arrival time.   Bone marrow biopsy has been scheduled for next Thursday, July 05, 2015. The patient needs to arrive at 830 am for a 930 am appointment. She will not be able to eat or drink anything 8 hours prior to the procedure (other than a sip of water with routine medications).     Bone Marrow Aspiration and Bone Marrow Biopsy Bone marrow aspiration and bone marrow biopsy are procedures that are done to diagnose blood disorders. You may also have one of these procedures to help diagnose infections or some types of cancer. Bone marrow is the soft tissue that is inside your bones. Blood cells are produced in bone marrow. For bone marrow aspiration, a sample of tissue in liquid form is removed from inside your bone. For a bone marrow biopsy, a small core of bone marrow tissue is removed. Then these samples are examined under a microscope or tested in a lab. You may need these procedures if you have an abnormal complete blood count (CBC). The aspiration or biopsy sample is usually taken from the top of your hip bone. Sometimes, an aspiration sample is taken from your chest bone (sternum). LET [facility] CARE PROVIDER KNOW ABOUT:  Any allergies you have.  All medicines you are taking, including vitamins, herbs, eye drops, creams, and over-the-counter medicines.  Previous problems you or members of your family have had with the use of anesthetics.  Any blood disorders you have.  Previous surgeries you have had.  Any medical conditions you may have.  Whether you are pregnant or you think that you may be pregnant. RISKS AND COMPLICATIONS Generally, this is a safe procedure. However, problems may occur,  including:  Infection.  Bleeding. BEFORE THE PROCEDURE  Ask your health care provider about:  Changing or stopping your regular medicines. This is especially important if you are taking diabetes medicines or blood thinners.  Taking medicines such as aspirin and ibuprofen. These medicines can thin your blood. Do not take these medicines before your procedure if your health care provider instructs you not to.  Plan to have someone take you home after the procedure.  If you go home right after the procedure, plan to have someone with you for 24 hours. PROCEDURE   An IV tube may be inserted into one of your veins.  The injection site will be cleaned with a germ-killing solution (antiseptic).  You will be given one or more of the following:  A medicine that helps you relax (sedative).  A medicine that numbs the area (local anesthetic).  The bone marrow sample will be removed as follows:  For an aspiration, a hollow needle will be inserted through your skin and into your bone. Bone marrow fluid will be drawn up into a syringe.  For a biopsy, your health care provider will use a hollow needle to remove a core of tissue from your bone marrow.  The needle will be removed.  A bandage (dressing) will be placed over the insertion site and taped in place. The procedure may vary among health care providers and hospitals. AFTER THE PROCEDURE  Your blood pressure, heart rate, breathing rate, and blood oxygen level will be monitored often until the medicines you were given have worn  off.  Return to your normal activities as directed by your health care provider.   This information is not intended to replace advice given to you by your health care provider. Make sure you discuss any questions you have with your health care provider.   Document Released: 05/22/2004 Document Revised: 10/03/2014 Document Reviewed: 05/10/2014 Elsevier Interactive Patient Education 2016 [place] An implanted port is a type of central line that is placed under the skin. Central lines are used to provide IV access when treatment or nutrition needs to be given through a person's veins. Implanted ports are used for long-term IV access. An implanted port may be placed because:   You need IV medicine that would be irritating to the small veins in your hands or arms.   You need long-term IV medicines, such as antibiotics.   You need IV nutrition for a long period.   You need frequent blood draws for lab tests.   You need dialysis.  Implanted ports are usually placed in the chest area, but they can also be placed in the upper arm, the abdomen, or the leg. An implanted port has two main parts:   Reservoir. The reservoir is round and will appear as a small, raised area under your skin. The reservoir is the part where a needle is inserted to give medicines or draw blood.   Catheter. The catheter is a thin, flexible tube that extends from the reservoir. The catheter is placed into a large vein. Medicine that is inserted into the reservoir goes into the catheter and then into the vein.  HOW WILL I CARE FOR MY INCISION SITE? Do not get the incision site wet. Bathe or shower as directed by your health care provider.  HOW IS MY PORT ACCESSED? Special steps must be taken to access the port:   Before the port is accessed, a numbing cream can be placed on the skin. This helps numb the skin over the port site.   Your health care provider uses a sterile technique to access the port.  Your health care provider must put on a mask and sterile gloves.  The skin over your port is cleaned carefully with an antiseptic and allowed to dry.  The port is gently pinched between sterile gloves, and a needle is inserted into the port.  Only "non-coring" port needles should be used to access the port. Once the port is accessed, a blood return should be checked.  This helps ensure that the port is in the vein and is not clogged.   If your port needs to remain accessed for a constant infusion, a clear (transparent) bandage will be placed over the needle site. The bandage and needle will need to be changed every week, or as directed by your health care provider.   Keep the bandage covering the needle clean and dry. Do not get it wet. Follow your health care provider's instructions on how to take a shower or bath while the port is accessed.   If your port does not need to stay accessed, no bandage is needed over the port.  WHAT IS FLUSHING? Flushing helps keep the port from getting clogged. Follow your health care provider's instructions on how and when to flush the port. Ports are usually flushed with saline solution or a medicine called heparin. The need for flushing will depend on how the port is used.   If the port is used for intermittent  medicines or blood draws, the port will need to be flushed:   After medicines have been given.   After blood has been drawn.   As part of routine maintenance.   If a constant infusion is running, the port may not need to be flushed.  HOW LONG WILL MY PORT STAY IMPLANTED? The port can stay in for as long as your health care provider thinks it is needed. When it is time for the port to come out, surgery will be done to remove it. The procedure is similar to the one performed when the port was put in.  WHEN SHOULD I SEEK IMMEDIATE MEDICAL CARE? When you have an implanted port, you should seek immediate medical care if:   You notice a bad smell coming from the incision site.   You have swelling, redness, or drainage at the incision site.   You have more swelling or pain at the port site or the surrounding area.   You have a fever that is not controlled with medicine.   This information is not intended to replace advice given to you by your health care provider. Make sure you discuss any questions  you have with your health care provider.   Document Released: 05/19/2005 Document Revised: 03/09/2013 Document Reviewed: 01/24/2013 Elsevier Interactive Patient Education 2016 [place] em[place] cream on day of chemotherapy apt. Approximately 1 hour before port access.   Lidocaine; Prilocaine cream What is this medicine? LIDOCAINE; PRILOCAINE (LYE doe kane; PRIL oh kane) is a topical anesthetic that causes loss of feeling in the skin and surrounding tissues. It is used to numb the skin before procedures or injections. This medicine may be used for other purposes; ask your health care provider or pharmacist if you have questions. What should I tell my health care provider before I take this medicine? They need to know if you have any of these conditions: -glucose-6-phosphate deficiencies -heart disease -kidney or liver disease -methemoglobinemia -an unusual or allergic reaction to lidocaine, prilocaine, other medicines, foods, dyes, or preservatives -pregnant or trying to get pregnant -breast-feeding How should I use this medicine? This medicine is for external use only on the skin. Do not take by mouth. Follow the directions on the prescription label. Wash hands before and after use. Do not use more or leave in contact with the skin longer than directed. Do not apply to eyes or open wounds. It can cause irritation and blurred or temporary loss of vision. If this medicine comes in contact with your eyes, immediately rinse the eye with water. Do not touch or rub the eye. Contact your health care provider right away. Talk to your pediatrician regarding the use of this medicine in children. While this medicine may be prescribed for children for selected conditions, precautions do apply. Overdosage: If you think you have taken too much of this medicine contact a poison control center or emergency room at once. NOTE: This medicine is only for you. Do not share this medicine with  others. What if I miss a dose? This medicine is usually only applied once prior to each procedure. It must be in contact with the skin for a period of time for it to work. If you applied this medicine later than directed, tell your health care professional before starting the procedure. What may interact with this medicine? -acetaminophen -chloroquine -dapsone -medicines to control heart rhythm -nitrates like nitroglycerin and nitroprusside -other ointments, creams, or sprays that may contain anesthetic medicine -phenobarbital -  phenytoin -quinine -sulfonamides like sulfacetamide, sulfamethoxazole, sulfasalazine and others This list may not describe all possible interactions. Give your health care provider a list of all the medicines, herbs, non-prescription drugs, or dietary supplements you use. Also tell them if you smoke, drink alcohol, or use illegal drugs. Some items may interact with your medicine. What should I watch for while using this medicine? Be careful to avoid injury to the treated area while it is numb and you are not aware of pain. Avoid scratching, rubbing, or exposing the treated area to hot or cold temperatures until complete sensation has returned. The numb feeling will wear off a few hours after applying the cream. What side effects may I notice from receiving this medicine? Side effects that you should report to your doctor or health care professional as soon as possible: -blurred vision -chest pain -difficulty breathing -dizziness -drowsiness -fast or irregular heartbeat -skin rash or itching -swelling of your throat, lips, or face -trembling Side effects that usually do not require medical attention (report to your doctor or health care professional if they continue or are bothersome): -changes in ability to feel hot or cold -redness and swelling at the application site This list may not describe all possible side effects. Call your doctor for medical advice  about side effects. You may report side effects to FDA at 1-800-FDA-1088. Where should I keep my medicine? Keep out of reach of children. Store at room temperature between 15 and 30 degrees C (59 and 86 degrees F). Keep container tightly closed. Throw away any unused medicine after the expiration date. NOTE: This sheet is a summary. It may not cover all possible information. If you have questions about this medicine, talk to your doctor, pharmacist, or health care provider.    2016, Elsevier/[employer] Standard. (2007-11-22 17:14:35)

## 2015-06-29 NOTE — Progress Notes (Signed)
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 for a second opinion.  Patient's cough has improved since being on Levaquin. Denies any abdominal pain nausea vomiting diarrhea. She unfortunately has been losing weight; but her appetite continues to be good. She continues to deny any fevers or night sweats.   Patient denies any ongoing headaches; denies any vision disturbance or neck pain; or falls. Denies any neuropathy. She is chronic arthritis.  ROS: A complete 10 point review of system is done which is negative except mentioned above in history of present illness  MEDICAL HISTORY:  Past Medical History  Diagnosis Date  . Arthritis   . Headache(784.0)   . GERD (gastroesophageal reflux disease)   . Hyperlipidemia     hx  . Lymphoma, mantle cell ([place]) 06/01/2015    bx of lymph node in right breast  . Lymphadenopathy of head and neck 01/2015    see on Thyroid ultrasound  . Rheumatoid arthritis ([place])   . History of methotrexate therapy     SURGICAL HISTORY: Past Surgical History  Procedure Laterality Date  . Cardiac catheterization  08/2004    [facility]    SOCIAL HISTORY: Social History   Social History  . Marital Status: Married    Spouse Name: N/A  . Number of Children: N/A  . Years of Education: N/A   Occupational History  . Not on file.   Social History Main Topics  . Smoking status: Never Smoker    . Smokeless tobacco: Never Used  . Alcohol Use: No  . Drug Use: No  . Sexual Activity: Not on file   Other Topics Concern  . 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    FAMILY HISTORY: Family History  Problem Relation Age of Onset  . Diabetes Mother   . Cholelithiasis Mother   . Hypertension Sister   . Diabetes Sister   . Arthritis Brother     ALLERGIES:  has No Known Allergies.  MEDICATIONS:  Current Outpatient Prescriptions  Medication Sig Dispense Refill  . Calcium Carbonate-Vit D-Min (CALTRATE 600+D PLUS) 600-800 MG-UNIT CHEW Chew 1 tablet by mouth 2 (two) times daily. 60 tablet 6  . folic acid (FOLVITE) 1 MG tablet Take 1 mg by mouth.    . hydroxychloroquine (PLAQUENIL) 200 MG tablet Take 400 mg by mouth daily.  1  . meloxicam (MOBIC) 15 MG tablet TAKE 1 TABLET (15 MG TOTAL) BY MOUTH DAILY WITH MEAL 30 tablet 3  . Multiple Vitamins-Minerals (MULTIVITAMIN GUMMIES ADULT) CHEW Chew by mouth.    . NON FORMULARY Herbalife-take one tablet every morning and every evening    . omeprazole (PRILOSEC) 40 MG capsule TAKE ONE CAPSULE EVERY DAY 30 capsule 5  . levofloxacin (LEVAQUIN) 500 MG tablet Take 500 mg by mouth daily.  0   No current facility-administered medications for this visit.      [name]  PHYSICAL  EXAMINATION: ECOG PERFORMANCE STATUS: 0 - Asymptomatic  Filed Vitals:   06/29/15 1036  BP: 130/74  Pulse: 76  Temp: 97.8 F (36.6 C)  Resp: 18   Filed Weights   06/29/15 1036  Weight: 198 lb 10.2 oz (90.1 kg)    GENERAL: Well-nourished well-developed; Alert, no distress and comfortable.  Accompanied by her daughter/husband. She is able to sit on the exam table without any significant help.  LABORATORY DATA:  I have reviewed the data as listed Lab Results  Component Value Date   WBC 6.8 06/14/2015   HGB 12.6 06/14/2015   HCT 39.0 06/14/2015   MCV 88.0 06/14/2015   PLT 213 06/14/2015    Recent Labs   06/14/15 0942  NA 136  K 3.3*  CL 105  CO2 27  GLUCOSE 101*  BUN 12  CREATININE 0.62  CALCIUM 8.9  GFRNONAA >60  GFRAA >60  PROT 7.1  ALBUMIN 3.8  AST 25  ALT 16  ALKPHOS 66  BILITOT 0.7    RADIOGRAPHIC STUDIES: I have personally reviewed the radiological images as listed and agreed with the findings in the report. Nm Pet Image Initial (pi) Skull Base To Thigh  06/20/2015  CLINICAL DATA:  Initial treatment strategy for mantle cell lymphoma. EXAM: NUCLEAR MEDICINE PET SKULL BASE TO THIGH TECHNIQUE: 12.5 mCi F-18 FDG was injected intravenously. Full-ring PET imaging was performed from the skull base to thigh after the radiotracer. CT data was obtained and used for attenuation correction and anatomic localization. FASTING BLOOD GLUCOSE:  Value: 80 mg/dl COMPARISON:  None. FINDINGS: NECK There is hypermetabolism associated with the oro/nasopharynx with possible soft tissue thickening on CT. Parotid glands and submandibular glands are also hypermetabolic. Hypermetabolic lymph nodes are seen predominantly in the level 2 stations bilaterally. Index 1.0 cm short axis lymph node on the right (CT image 36) has an SUV max of 6.5. CT images show no acute findings. Mucosal thickening in the right maxillary sinus. CHEST There are hypermetabolic mediastinal, hilar and axillary lymph nodes. Index low right paratracheal lymph node measures 1.4 cm (CT image 81) with an SUV max of 4.2. Left hilar hypermetabolism has an SUV max of 4.9. A discrete lymph node is not well seen on CT, possibly due to lack of IV contrast. Bilateral subpectoral and axillary adenopathy is hypermetabolic with an index 8 mm lymph node on the left (CT image 79) with an SUV max of 4.9. Areas of subpleural consolidation in the left upper, left lower and right lower lobes show mild hypermetabolism. CT images show no pericardial or pleural effusion. ABDOMEN/PELVIS Hypermetabolic lymph nodes are seen in the abdominal peritoneal ligaments  bilaterally. Discrete measurements of lymph nodes in the porta hepatis region is difficult without IV contrast. SUV max in this region is approximately 5.9. Hypermetabolic lymph nodes are seen along the iliac chains and inguinal regions bilaterally. Proximal left common iliac lymph node measures 1.2 cm (CT image 175) with an SUV max of 4.0. Index left inguinal lymph node measures 1.3 cm (CT image 219) with an SUV max of 4.9. Liver, gallbladder, adrenal glands, kidneys, spleen, pancreas, stomach and bowel are otherwise grossly unremarkable. No free fluid. SKELETON No abnormal osseous hypermetabolism. IMPRESSION: 1. Hypermetabolic adenopathy in the neck, chest, abdomen and pelvis, consistent with the given history of lymphoma. 2. Oral/nasopharyngeal hypermetabolism can be seen in the setting of lymphoma. Please correlate clinically. 3. Hypermetabolism within subpleural consolidation in the left upper, left lower and right lower lobes may be infectious or inflammatory  in etiology. Pulmonary lymphoma is not excluded Electronically Signed   By: Lorin Picket M.D.   On: 06/20/2015 11:19   Mm Digital Diagnostic Unilat R  06/01/2015  CLINICAL DATA:  Patient is post ultrasound-guided core needle biopsy of a 1.3 cm mass over the 12:30 position of the right breast 10 cm from the nipple as well as biopsy of an abnormal right axillary lymph node. EXAM: DIAGNOSTIC RIGHT MAMMOGRAM POST ULTRASOUND BIOPSY COMPARISON:  Previous exam(s). FINDINGS: Mammographic images were obtained following ultrasound guided biopsy of the 1.3 cm mass over the 12:30 position of the right breast 10 cm from the nipple as well as biopsy of a right axillary lymph node. Exam demonstrates satisfactory placement of the coil shaped metallic clip adjacent the biopsied mass as the clip lies 5 mm anterior inferior to the biopsied mass. IMPRESSION: Satisfactory clip placement post ultrasound-guided core biopsy right breast mass. Final Assessment: Post  Procedure Mammograms for Marker Placement Electronically Signed   By: Marin Olp M.D.   On: 06/01/2015 10:16   [place] Rt Breast Bx W Loc Dev 1st Lesion Img Bx Spec US Guide  06/13/2015  ADDENDUM REPORT: 06/13/2015 13:44 ADDENDUM: Pathology of the right breast biopsy and right axillary lymph node biopsy both revealed MANTLE CELL LYMPHOMA. These findings were communicated to Drs. Raylene Everts on 06/08/2015 by the pathologist. This was found to be concordant by Dr. Derrel Nip. Recommendations:  Recommend oncology referral. Multiple attempts were made by Jetta Lout, [facility]) and Dr. Derrel Nip to reach the patient between January 3-6, 2017 for a biopsy site check up and also to inform the patient of a delay in the pathology results, but were unsuccessful. Jetta Lout, RRA spoke with Dr. Ronette Deter, the referring physician on 06/13/15. Dr. Gilford Rile stated the patient has an appointment today at 1:00 PM to discuss the biopsy results. Dr. Gilford Rile will make the oncology referral. Addendum by Jetta Lout, [place] on 06/13/15. Electronically Signed   By: Marin Olp M.D.   On: 06/13/2015 13:44  06/13/2015  CLINICAL DATA:  Patient presents for ultrasound-guided core needle biopsy of a 1.3 cm mass over the 12:30 position of the right breast 10 cm from the nipple. EXAM: ULTRASOUND GUIDED right BREAST CORE NEEDLE BIOPSY COMPARISON:  Previous exam(s). PROCEDURE: I met with the patient and we discussed the procedure of ultrasound-guided biopsy, including benefits and alternatives. We discussed the high likelihood of a successful procedure. We discussed the risks of the procedure including infection, bleeding, tissue injury, clip migration, and inadequate sampling. Informed written consent was given. The usual time-out protocol was performed immediately prior to the procedure. Using sterile technique and 2% Lidocaine as local anesthetic, under direct ultrasound visualization, a 12 gauge vacuum-assisted device was  used to perform biopsy of the targeted mass at the 12:30 positionusing a inferior to superior approach. At the conclusion of the procedure, a coil shaped tissue marker clip was deployed into the biopsy cavity. Follow-up 2-view mammogram was performed and dictated separately. IMPRESSION: Ultrasound-guided biopsy of a right breast mass. No apparent complications. Electronically Signed: By: Marin Olp M.D. On: 06/01/2015 10:18   [place] Rt Breast Bx W Loc Dev Ea Add Lesion Img Bx Spec US Guide  06/13/2015  ADDENDUM REPORT: 06/13/2015 13:51 ADDENDUM: Pathology of the right breast mass biopsy and right axillary lymph node biopsy both revealed MANTLE CELL LYMPHOMA. Per the pathology report, these findings were communicated to Drs. Raylene Everts on 06/08/2015 by the pathologist. This patholgy finding was  found to be concordant by Dr. Derrel Nip. Recommendations:  Recommend oncology referral. Multiple attempts were made by Jetta Lout, [facility]) and Dr. Derrel Nip from 1/3 - 06/08/15 for a post biopsy check up and to inform the patient of a delay in the results but were unsuccessful. Jetta Lout, RRA spoke with Dr. Ronette Deter on the morning of 06/13/15. Dr. Gilford Rile stated the patient has an appointment to see her today at 1:00 PM for the biopsy results. She will make the oncology referral. Addendum by Jetta Lout, RRA on 06/13/15. Electronically Signed   By: Marin Olp M.D.   On: 06/13/2015 13:51  06/13/2015  CLINICAL DATA:  Patient presents for ultrasound-guided needle biopsy of the abnormal right axillary lymph node. Patient was noted to have bilateral abnormal axillary lymph nodes with thickened cortices on recent mammogram. Known rheumatoid arthritis. EXAM: ULTRASOUND GUIDED CORE NEEDLE BIOPSY OF A RIGHT AXILLARY NODE COMPARISON:  Previous exam(s). FINDINGS: I met with the patient and we discussed the procedure of ultrasound-guided biopsy, including benefits and alternatives. We discussed the high  likelihood of a successful procedure. We discussed the risks of the procedure, including infection, bleeding, tissue injury, clip migration, and inadequate sampling. Informed written consent was given. The usual time-out protocol was performed immediately prior to the procedure. Using sterile technique and 2% Lidocaine as local anesthetic, under direct ultrasound visualization, a 14 gauge spring-loaded device was used to perform biopsy of an abnormal right axillary lymph node using a inferior lateral to superior medial approach. Two specimens were placed in a container of formalin and 2 specimens into a container saline. Patient tolerated the procedure well with minimal bleeding along the biopsy tract. IMPRESSION: Ultrasound guided biopsy of abnormal right axillary lymph node. No apparent complications. Electronically Signed: By: Marin Olp M.D. On: 06/01/2015 10:28    ASSESSMENT & PLAN:   # MANTLE CELL LYMPHOMA-biopsy-proven [right axillary lymph node/right breast intramammary lymph node]. MIPI index-5/intermediate risk  [mostly based on age]. At least clinical stage IIIB. Await BMBx next week; port placement next week.    I recommend bendamustine and rituximab 6 cycles. Reviewed the- rituximab infusion reaction addressed once again. Patient's agreement. As per pt's preference plan chemo on Feb 9th. # follow up with me on Feb 9th- labs on that day/ chemo.   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 Maintenance rituximab was discussed.  # 26mnutes face-to-face with the patient discussing the above plan of care; more than 50% of time spent on prognosis/ natural history; counseling and coordination.     GCammie Sickle MD 06/29/2015 11:00 AM

## 2015-06-29 NOTE — Progress Notes (Signed)
Approximately 20 minutes spent educating the patient and her family regarding the purpose of a port placement, bone marrow biopsy, & chemotherapy class. Discussed treatment dates with patient. Discussed application method of EMLA cream on port a cath on day of chemotherapy treatments. Teach back process performed with patient.

## 2015-07-02 ENCOUNTER — Other Ambulatory Visit: Payer: Self-pay | Admitting: Vascular Surgery

## 2015-07-04 ENCOUNTER — Ambulatory Visit
Admission: RE | Admit: 2015-07-04 | Discharge: 2015-07-04 | Disposition: A | Discharge status: Home / Self Care | Payer: Medicare Other | Source: Ambulatory Visit | Attending: Vascular Surgery | Admitting: Vascular Surgery

## 2015-07-04 ENCOUNTER — Encounter: Payer: Self-pay | Admitting: *Deleted

## 2015-07-04 ENCOUNTER — Encounter
Admission: RE | Disposition: A | Discharge status: Home / Self Care | Payer: Self-pay | Source: Ambulatory Visit | Attending: Vascular Surgery

## 2015-07-04 DIAGNOSIS — Z79899 Other long term (current) drug therapy: Secondary | ICD-10-CM | POA: Insufficient documentation

## 2015-07-04 DIAGNOSIS — M069 Rheumatoid arthritis, unspecified: Secondary | ICD-10-CM | POA: Diagnosis not present

## 2015-07-04 DIAGNOSIS — K219 Gastro-esophageal reflux disease without esophagitis: Secondary | ICD-10-CM | POA: Diagnosis not present

## 2015-07-04 DIAGNOSIS — C8311 Mantle cell lymphoma, lymph nodes of head, face, and neck: Secondary | ICD-10-CM | POA: Diagnosis not present

## 2015-07-04 DIAGNOSIS — C8310 Mantle cell lymphoma, unspecified site: Secondary | ICD-10-CM | POA: Diagnosis not present

## 2015-07-04 DIAGNOSIS — E785 Hyperlipidemia, unspecified: Secondary | ICD-10-CM | POA: Insufficient documentation

## 2015-07-04 HISTORY — PX: PERIPHERAL VASCULAR CATHETERIZATION: SHX172C

## 2015-07-04 SURGERY — PORTA CATH INSERTION
Anesthesia: Moderate Sedation

## 2015-07-04 MED ORDER — LIDOCAINE-EPINEPHRINE (PF) 1 %-1:200000 IJ SOLN
INTRAMUSCULAR | Status: AC
  Filled 2015-07-04: qty 30

## 2015-07-04 MED ORDER — SODIUM CHLORIDE 0.9 % IV SOLN
INTRAVENOUS | Status: DC
Start: 2015-07-04 — End: 2015-07-04
  Administered 2015-07-04: 10:00:00 via INTRAVENOUS

## 2015-07-04 MED ORDER — FAMOTIDINE 20 MG PO TABS: 40.0000 mg | ORAL_TABLET | ORAL | Status: DC | PRN

## 2015-07-04 MED ORDER — ONDANSETRON HCL 4 MG/2ML IJ SOLN: 4.0000 mg | Freq: Four times a day (QID) | INTRAMUSCULAR | Status: DC | PRN

## 2015-07-04 MED ORDER — METHYLPREDNISOLONE SODIUM SUCC 125 MG IJ SOLR: 125.0000 mg | INTRAMUSCULAR | Status: DC | PRN

## 2015-07-04 MED ORDER — FENTANYL CITRATE (PF) 100 MCG/2ML IJ SOLN
INTRAMUSCULAR | Status: AC
  Filled 2015-07-04: qty 2

## 2015-07-04 MED ORDER — MIDAZOLAM HCL 2 MG/2ML IJ SOLN
INTRAMUSCULAR | Status: DC | PRN
  Administered 2015-07-04 (×2): 1 mg via INTRAVENOUS
  Administered 2015-07-04: 2 mg via INTRAVENOUS

## 2015-07-04 MED ORDER — FENTANYL CITRATE (PF) 100 MCG/2ML IJ SOLN
INTRAMUSCULAR | Status: DC | PRN
  Administered 2015-07-04 (×3): 50 ug via INTRAVENOUS

## 2015-07-04 MED ORDER — DEXTROSE 5 % IV SOLN
1.5000 g | INTRAVENOUS | Status: AC
  Administered 2015-07-04: 1.5 g via INTRAVENOUS

## 2015-07-04 MED ORDER — SODIUM CHLORIDE 0.9 % IR SOLN
Freq: Once | Status: DC
  Filled 2015-07-04: qty 2

## 2015-07-04 MED ORDER — HYDROMORPHONE HCL 1 MG/ML IJ SOLN: 1.0000 mg | Freq: Once | INTRAMUSCULAR | Status: DC

## 2015-07-04 MED ORDER — HEPARIN (PORCINE) IN NACL 2-0.9 UNIT/ML-% IJ SOLN
INTRAMUSCULAR | Status: AC
  Filled 2015-07-04: qty 500

## 2015-07-04 MED ORDER — MIDAZOLAM HCL 5 MG/5ML IJ SOLN
INTRAMUSCULAR | Status: AC
  Filled 2015-07-04: qty 5

## 2015-07-04 MED ORDER — LIDOCAINE-EPINEPHRINE (PF) 1 %-1:200000 IJ SOLN
INTRAMUSCULAR | Status: DC | PRN
  Administered 2015-07-04: 20 mL via INTRADERMAL

## 2015-07-04 SURGICAL SUPPLY — 10 items
BAG DECANTER STRL (MISCELLANEOUS) ×2 IMPLANT
KIT PORT POWER 8FR ISP CVUE (Catheter) ×2 IMPLANT
PACK ANGIOGRAPHY (CUSTOM PROCEDURE TRAY) ×2 IMPLANT
PAD GROUND ADULT SPLIT (MISCELLANEOUS) ×2 IMPLANT
PENCIL ELECTRO HAND CTR (MISCELLANEOUS) ×2 IMPLANT
PREP CHG 10.5 TEAL (MISCELLANEOUS) ×2 IMPLANT
SUT MNCRL AB 4-0 PS2 18 (SUTURE) ×2 IMPLANT
SUT PROLENE 0 CT 1 30 (SUTURE) ×2 IMPLANT
SUTURE VIC 3-0 (SUTURE) ×2 IMPLANT
TOWEL OR 17X26 4PK STRL BLUE (TOWEL DISPOSABLE) ×2 IMPLANT

## 2015-07-04 NOTE — Op Note (Signed)
      [place] [facility]       Operative Note  Date: 07/04/2015  Preoperative diagnosis:  1. Mantel cell lymphoma  Postoperative diagnosis:  Same as above  Procedures: #1. Ultrasound guidance for vascular access to the right internal jugular vein. #2. Fluoroscopic guidance for placement of catheter. #3. Placement of CT compatible Port-A-Cath, right internal jugular vein.  Surgeon: Leotis Pain, MD.   Anesthesia: Local with moderate conscious sedation for approximately 20 minutes using 4 mg of Versed and 150 mcg of Fentanyl  Fluoroscopy time: less than 1 minute  Contrast used: 0  Estimated blood loss: 25 cc  Indication for the procedure:  The patient is a 75 y.o.female with lymphoma.  The patient needs a Port-A-Cath for durable venous access, chemotherapy, lab draws, and CT scans. We are asked to place this. Risks and benefits were discussed and informed consent was obtained.  Description of procedure: The patient was brought to the vascular and interventional radiology suite.  Moderate conscious sedation was administered throughout the procedure with my supervision of the RN administering medicines and monitoring the patient's vital signs, pulse oximetry, telemetry and mental status throughout from the start of the procedure until the patient was taken to the recovery room. The right neck chest and shoulder were sterilely prepped and draped, and a sterile surgical field was created. Ultrasound was used to help visualize a patent right internal jugular vein. This was then accessed under direct ultrasound guidance without difficulty with the Seldinger needle and a permanent image was recorded. A J-wire was placed. After skin nick and dilatation, the peel-away sheath was then placed over the wire. I then anesthetized an area under the clavicle approximately 1-2 fingerbreadths. A transverse incision was created and an inferior pocket was created with electrocautery and blunt  dissection. The port was then brought onto the field, placed into the pocket and secured to the chest wall with 2 Prolene sutures. The catheter was connected to the port and tunneled from the subclavicular incision to the access site. Fluoroscopic guidance was then used to cut the catheter to an appropriate length. The catheter was then placed through the peel-away sheath and the peel-away sheath was removed. The catheter tip was parked in excellent location under fluorocoscopic guidance in the SVC just above the right atrium. The pocket was then irrigated with antibiotic impregnated saline and the wound was closed with a running 3-0 Vicryl and a 4-0 Monocryl. The access incision was closed with a single 4-0 Monocryl. The Huber needle was used to withdraw blood and flush the port with heparinized saline. Dermabond was then placed as a dressing. The patient tolerated the procedure well and was taken to the recovery room in stable condition.   Nyari Olsson 07/04/2015 11:29 AM

## 2015-07-04 NOTE — H&P (Signed)
   [facility] History & Physical Update  The patient was interviewed and re-examined.  The patient's previous History and Physical has been reviewed and is unchanged.  There is no change in the plan of care. We plan to proceed with the scheduled procedure.  DEW,JASON, MD  07/04/2015, 10:06 AM

## 2015-07-05 ENCOUNTER — Ambulatory Visit
Admission: RE | Admit: 2015-07-05 | Discharge: 2015-07-05 | Disposition: A | Discharge status: Home / Self Care | Payer: Medicare Other | Source: Ambulatory Visit | Attending: Internal Medicine | Admitting: Internal Medicine

## 2015-07-05 DIAGNOSIS — C8314 Mantle cell lymphoma, lymph nodes of axilla and upper limb: Secondary | ICD-10-CM | POA: Diagnosis not present

## 2015-07-05 DIAGNOSIS — Z8572 Personal history of non-Hodgkin lymphomas: Secondary | ICD-10-CM | POA: Diagnosis not present

## 2015-07-05 DIAGNOSIS — C8313 Mantle cell lymphoma, intra-abdominal lymph nodes: Secondary | ICD-10-CM | POA: Insufficient documentation

## 2015-07-05 DIAGNOSIS — R898 Other abnormal findings in specimens from other organs, systems and tissues: Secondary | ICD-10-CM | POA: Diagnosis not present

## 2015-07-05 LAB — CBC
HCT: 39.7 % (ref 35.0–47.0)
Hemoglobin: 13 g/dL (ref 12.0–16.0)
MCH: 28.6 pg (ref 26.0–34.0)
MCHC: 32.7 g/dL (ref 32.0–36.0)
MCV: 87.3 fL (ref 80.0–100.0)
Platelets: 172 10*3/uL (ref 150–440)
RBC: 4.55 MIL/uL (ref 3.80–5.20)
RDW: 16.1 % — ABNORMAL HIGH (ref 11.5–14.5)
WBC: 8.3 10*3/uL (ref 3.6–11.0)

## 2015-07-05 LAB — DIFFERENTIAL
Basophils Absolute: 0 10*3/uL (ref 0–0.1)
Basophils Relative: 0 %
Eosinophils Absolute: 0.2 10*3/uL (ref 0–0.7)
Eosinophils Relative: 3 %
Lymphocytes Relative: 37 %
Lymphs Abs: 3 10*3/uL (ref 1.0–3.6)
Monocytes Absolute: 0.4 10*3/uL (ref 0.2–0.9)
Monocytes Relative: 5 %
Neutro Abs: 4.5 10*3/uL (ref 1.4–6.5)
Neutrophils Relative %: 55 %

## 2015-07-05 MED ORDER — HEPARIN SOD (PORK) LOCK FLUSH 100 UNIT/ML IV SOLN
INTRAVENOUS | Status: AC
  Filled 2015-07-05: qty 5

## 2015-07-05 MED ORDER — FENTANYL CITRATE (PF) 100 MCG/2ML IJ SOLN
INTRAMUSCULAR | Status: AC | PRN
  Administered 2015-07-05: 25 ug via INTRAVENOUS
  Administered 2015-07-05: 50 ug via INTRAVENOUS
  Administered 2015-07-05: 25 ug via INTRAVENOUS

## 2015-07-05 MED ORDER — FENTANYL CITRATE (PF) 100 MCG/2ML IJ SOLN
INTRAMUSCULAR | Status: AC
  Filled 2015-07-05: qty 4

## 2015-07-05 MED ORDER — MIDAZOLAM HCL 5 MG/5ML IJ SOLN
INTRAMUSCULAR | Status: AC
  Filled 2015-07-05: qty 5

## 2015-07-05 MED ORDER — MIDAZOLAM HCL 5 MG/5ML IJ SOLN
INTRAMUSCULAR | Status: AC | PRN
  Administered 2015-07-05: 1 mg via INTRAVENOUS
  Administered 2015-07-05: 0.5 mg via INTRAVENOUS
  Administered 2015-07-05: 1 mg via INTRAVENOUS
  Administered 2015-07-05 (×2): 0.5 mg via INTRAVENOUS

## 2015-07-05 NOTE — H&P (Signed)
Chief Complaint: Mantle Cell lymphoma  Referring Physician(s): Brahmanday,Govinda R  History of Present Illness: Michelle [name] is a 75 y.o. female referred for a CT guided bone marrow bx.    Past Medical History  Diagnosis Date  . Arthritis   . Headache(784.0)   . GERD (gastroesophageal reflux disease)   . Hyperlipidemia     hx  . Lymphoma, mantle cell ([place]) 06/01/2015    bx of lymph node in right breast  . Lymphadenopathy of head and neck 01/2015    see on Thyroid ultrasound  . Rheumatoid arthritis ([place])   . History of methotrexate therapy     Past Surgical History  Procedure Laterality Date  . Cardiac catheterization  08/2004    [facility]  . Peripheral vascular catheterization N/A 07/04/2015    Procedure: [name] Insertion;  Surgeon: [name], MD;  Location: [place] [facility];  Service: Cardiovascular;  Laterality: N/A;    Allergies: Review of patient's allergies indicates no known allergies.  Medications: Prior to Admission medications   Medication Sig Start Date End Date Taking? Authorizing Provider  Calcium Carbonate-Vit D-Min (CALTRATE 600+D PLUS) 600-800 MG-UNIT CHEW Chew 1 tablet by mouth 2 (two) times daily. 09/18/11  Yes Jackolyn Confer, MD  folic acid (FOLVITE) 1 MG tablet Take 1 mg by mouth.   Yes Historical Provider, MD  hydroxychloroquine (PLAQUENIL) 200 MG tablet Take 400 mg by mouth daily. 05/12/15  Yes Historical Provider, MD  levofloxacin (LEVAQUIN) 500 MG tablet Take 500 mg by mouth daily. Reported on 07/04/2015 06/22/15  Yes Historical Provider, MD  lidocaine-prilocaine (EMLA) cream Apply 1 application topically as needed. 06/29/15  Yes Cammie Sickle, MD  meloxicam (MOBIC) 15 MG tablet TAKE 1 TABLET (15 MG TOTAL) BY MOUTH DAILY WITH MEAL 03/21/15  Yes Rubbie Battiest, NP  Multiple Vitamins-Minerals (MULTIVITAMIN GUMMIES ADULT) CHEW Chew by mouth.   Yes Historical Provider, MD  NON FORMULARY Herbalife-take one tablet every morning and  every evening   Yes Historical Provider, MD  omeprazole (PRILOSEC) 40 MG capsule TAKE ONE CAPSULE EVERY DAY 10/06/13  Yes Jackolyn Confer, MD  traMADol (ULTRAM) 50 MG tablet Take 50 mg by mouth every morning.   Yes Historical Provider, MD  vitamin E 400 UNIT capsule Take 400 Units by mouth daily.   Yes Historical Provider, MD     Family History  Problem Relation Age of Onset  . Diabetes Mother   . Cholelithiasis Mother   . Hypertension Sister   . Diabetes Sister   . Arthritis Brother     Social History   Social History  . Marital Status: Married    Spouse Name: N/A  . Number of Children: N/A  . Years of Education: N/A   Social History Main Topics  . Smoking status: Never Smoker   . Smokeless tobacco: Never Used  . Alcohol Use: No  . Drug Use: No  . Sexual Activity: Not Asked   Other Topics Concern  . 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    ECOG Status: 0 - Asymptomatic  Review of Systems: A 12 point ROS discussed and pertinent positives are indicated in the HPI above.  All other systems are negative.  Review of Systems  Vital Signs: BP 113/68 mmHg  Pulse 79  Temp(Src) 98.6 F (37 C) (Oral)  Resp 13  SpO2 100%  Physical Exam  Mallampati Score:     Imaging: Nm  Pet Image Initial (pi) Skull Base To Thigh  06/20/2015  CLINICAL DATA:  Initial treatment strategy for mantle cell lymphoma. EXAM: NUCLEAR MEDICINE PET SKULL BASE TO THIGH TECHNIQUE: 12.5 mCi F-18 FDG was injected intravenously. Full-ring PET imaging was performed from the skull base to thigh after the radiotracer. CT data was obtained and used for attenuation correction and anatomic localization. FASTING BLOOD GLUCOSE:  Value: 80 mg/dl COMPARISON:  None. FINDINGS: NECK There is hypermetabolism associated with the oro/nasopharynx with possible soft tissue thickening on CT. Parotid glands and submandibular glands are also hypermetabolic. Hypermetabolic lymph  nodes are seen predominantly in the level 2 stations bilaterally. Index 1.0 cm short axis lymph node on the right (CT image 36) has an SUV max of 6.5. CT images show no acute findings. Mucosal thickening in the right maxillary sinus. CHEST There are hypermetabolic mediastinal, hilar and axillary lymph nodes. Index low right paratracheal lymph node measures 1.4 cm (CT image 81) with an SUV max of 4.2. Left hilar hypermetabolism has an SUV max of 4.9. A discrete lymph node is not well seen on CT, possibly due to lack of IV contrast. Bilateral subpectoral and axillary adenopathy is hypermetabolic with an index 8 mm lymph node on the left (CT image 79) with an SUV max of 4.9. Areas of subpleural consolidation in the left upper, left lower and right lower lobes show mild hypermetabolism. CT images show no pericardial or pleural effusion. ABDOMEN/PELVIS Hypermetabolic lymph nodes are seen in the abdominal peritoneal ligaments bilaterally. Discrete measurements of lymph nodes in the porta hepatis region is difficult without IV contrast. SUV max in this region is approximately 5.9. Hypermetabolic lymph nodes are seen along the iliac chains and inguinal regions bilaterally. Proximal left common iliac lymph node measures 1.2 cm (CT image 175) with an SUV max of 4.0. Index left inguinal lymph node measures 1.3 cm (CT image 219) with an SUV max of 4.9. Liver, gallbladder, adrenal glands, kidneys, spleen, pancreas, stomach and bowel are otherwise grossly unremarkable. No free fluid. SKELETON No abnormal osseous hypermetabolism. IMPRESSION: 1. Hypermetabolic adenopathy in the neck, chest, abdomen and pelvis, consistent with the given history of lymphoma. 2. Oral/nasopharyngeal hypermetabolism can be seen in the setting of lymphoma. Please correlate clinically. 3. Hypermetabolism within subpleural consolidation in the left upper, left lower and right lower lobes may be infectious or inflammatory in etiology. Pulmonary lymphoma is  not excluded Electronically Signed   By: Lorin Picket M.D.   On: 06/20/2015 11:19    Labs:  CBC:  Recent Labs  06/14/15 0942 07/05/15 0846  WBC 6.8 8.3  HGB 12.6 13.0  HCT 39.0 39.7  PLT 213 172    COAGS: No results for input(s): INR, APTT in the last 8760 hours.  BMP:  Recent Labs  06/14/15 0942  NA 136  K 3.3*  CL 105  CO2 27  GLUCOSE 101*  BUN 12  CALCIUM 8.9  CREATININE 0.62  GFRNONAA >60  GFRAA >60    LIVER FUNCTION TESTS:  Recent Labs  06/14/15 0942  BILITOT 0.7  AST 25  ALT 16  ALKPHOS 66  PROT 7.1  ALBUMIN 3.8    TUMOR MARKERS: No results for input(s): AFPTM, CEA, CA199, CHROMGRNA in the last 8760 hours.  Assessment and Plan:  75 yo female presenting with mantle cell lymphoma and CT guided bone marrow biopsy, with moderate sedation.     Electronically Signed: Corrie Mckusick 07/05/2015, 10:45 AM

## 2015-07-05 NOTE — Patient Instructions (Signed)
Rituximab injection  What is this medicine?  RITUXIMAB (ri TUX i mab) is a monoclonal antibody. It is used commonly to treat non-Hodgkin lymphoma and other conditions. It is also used to treat rheumatoid arthritis (RA). In RA, this medicine slows the inflammatory process and help reduce joint pain and swelling. This medicine is often used with other cancer or arthritis medications.  This medicine may be used for other purposes; ask your health care provider or pharmacist if you have questions.  What should I tell my health care provider before I take this medicine?  They need to know if you have any of these conditions:  -blood disorders  -heart disease  -history of hepatitis B  -infection (especially a virus infection such as chickenpox, cold sores, or herpes)  -irregular heartbeat  -kidney disease  -lung or breathing disease, like asthma  -lupus  -an unusual or allergic reaction to rituximab, mouse proteins, other medicines, foods, dyes, or preservatives  -pregnant or trying to get pregnant  -breast-feeding  How should I use this medicine?  This medicine is for infusion into a vein. It is administered in a hospital or clinic by a specially trained health care professional.  A special MedGuide will be given to you by the pharmacist with each prescription and refill. Be sure to read this information carefully each time.  Talk to your pediatrician regarding the use of this medicine in children. This medicine is not approved for use in children.  Overdosage: If you think you have taken too much of this medicine contact a poison control center or emergency room at once.  NOTE: This medicine is only for you. Do not share this medicine with others.  What if I miss a dose?  It is important not to miss a dose. Call your doctor or health care professional if you are unable to keep an appointment.  What may interact with this medicine?  -cisplatin  -medicines for blood pressure  -some other medicines for  arthritis  -vaccines  This list may not describe all possible interactions. Give your health care provider a list of all the medicines, herbs, non-prescription drugs, or dietary supplements you use. Also tell them if you smoke, drink alcohol, or use illegal drugs. Some items may interact with your medicine.  What should I watch for while using this medicine?  Report any side effects that you notice during your treatment right away, such as changes in your breathing, fever, chills, dizziness or lightheadedness. These effects are more common with the first dose.  Visit your prescriber or health care professional for checks on your progress. You will need to have regular blood work. Report any other side effects. The side effects of this medicine can continue after you finish your treatment. Continue your course of treatment even though you feel ill unless your doctor tells you to stop.  Call your doctor or health care professional for advice if you get a fever, chills or sore throat, or other symptoms of a cold or flu. Do not treat yourself. This drug decreases your body's ability to fight infections. Try to avoid being around people who are sick.  This medicine may increase your risk to bruise or bleed. Call your doctor or health care professional if you notice any unusual bleeding.  Be careful brushing and flossing your teeth or using a toothpick because you may get an infection or bleed more easily. If you have any dental work done, tell your dentist you are receiving this medicine.    Avoid taking products that contain aspirin, acetaminophen, ibuprofen, naproxen, or ketoprofen unless instructed by your doctor. These medicines may hide a fever.  Do not become pregnant while taking this medicine. Women should inform their doctor if they wish to become pregnant or think they might be pregnant. There is a potential for serious side effects to an unborn child. Talk to your health care professional or pharmacist for more  information. Do not breast-feed an infant while taking this medicine.  What side effects may I notice from receiving this medicine?  Side effects that you should report to your doctor or health care professional as soon as possible:  -allergic reactions like skin rash, itching or hives, swelling of the face, lips, or tongue  -low blood counts - this medicine may decrease the number of white blood cells, red blood cells and platelets. You may be at increased risk for infections and bleeding.  -signs of infection - fever or chills, cough, sore throat, pain or difficulty passing urine  -signs of decreased platelets or bleeding - bruising, pinpoint red spots on the skin, black, tarry stools, blood in the urine  -signs of decreased red blood cells - unusually weak or tired, fainting spells, lightheadedness  -breathing problems  -confused, not responsive  -chest pain  -fast, irregular heartbeat  -feeling faint or lightheaded, falls  -mouth sores  -redness, blistering, peeling or loosening of the skin, including inside the mouth  -stomach pain  -swelling of the ankles, feet, or hands  -trouble passing urine or change in the amount of urine  Side effects that usually do not require medical attention (report to your doctor or other health care professional if they continue or are bothersome):  -anxiety  -headache  -loss of appetite  -muscle aches  -nausea  -night sweats  This list may not describe all possible side effects. Call your doctor for medical advice about side effects. You may report side effects to FDA at 1-800-FDA-1088.  Where should I keep my medicine?  This drug is given in a hospital or clinic and will not be stored at home.  NOTE: This sheet is a summary. It may not cover all possible information. If you have questions about this medicine, talk to your doctor, pharmacist, or health care provider.      2016, Elsevier/Gold Standard. (2014-07-26 22:30:56)  Bendamustine Injection  What is this  medicine?  BENDAMUSTINE (BEN da MUS teen) is a chemotherapy drug. It is used to treat chronic lymphocytic leukemia and non-Hodgkin lymphoma.  This medicine may be used for other purposes; ask your health care provider or pharmacist if you have questions.  What should I tell my health care provider before I take this medicine?  They need to know if you have any of these conditions:  -infection (especially a virus infection such as chickenpox, cold sores, or herpes)  -kidney disease  -liver disease  -an unusual or allergic reaction to bendamustine, mannitol, other medicines, foods, dyes, or preservatives  -pregnant or trying to get pregnant  -breast-feeding  How should I use this medicine?  This medicine is for infusion into a vein. It is given by a health care professional in a hospital or clinic setting.  Talk to your pediatrician regarding the use of this medicine in children. Special care may be needed.  Overdosage: If you think you have taken too much of this medicine contact a poison control center or emergency room at once.  NOTE: This medicine is only for you. Do not   share this medicine with others.  What if I miss a dose?  It is important not to miss your dose. Call your doctor or health care professional if you are unable to keep an appointment.  What may interact with this medicine?  Do not take this medicine with any of the following medications:  -clozapine  This medicine may also interact with the following medications:  -atazanavir  -cimetidine  -ciprofloxacin  -enoxacin  -fluvoxamine  -medicines for seizures like carbamazepine and phenobarbital  -mexiletine  -rifampin  -tacrine  -thiabendazole  -zileuton  This list may not describe all possible interactions. Give your health care provider a list of all the medicines, herbs, non-prescription drugs, or dietary supplements you use. Also tell them if you smoke, drink alcohol, or use illegal drugs. Some items may interact with your medicine.  What should I  watch for while using this medicine?  This drug may make you feel generally unwell. This is not uncommon, as chemotherapy can affect healthy cells as well as cancer cells. Report any side effects. Continue your course of treatment even though you feel ill unless your doctor tells you to stop.  You may need blood work done while you are taking this medicine.  Call your doctor or health care professional for advice if you get a fever, chills or sore throat, or other symptoms of a cold or flu. Do not treat yourself. This drug decreases your body's ability to fight infections. Try to avoid being around people who are sick.  This medicine may increase your risk to bruise or bleed. Call your doctor or health care professional if you notice any unusual bleeding.  Talk to your doctor about your risk of cancer. You may be more at risk for certain types of cancers if you take this medicine.  Do not become pregnant while taking this medicine or for 3 months after stopping it. Women should inform their doctor if they wish to become pregnant or think they might be pregnant. Men should not father a child while taking this medicine and for 3 months after stopping it.There is a potential for serious side effects to an unborn child. Talk to your health care professional or pharmacist for more information. Do not breast-feed an infant while taking this medicine.  This medicine may interfere with the ability to have a child. You should talk with your doctor or health care professional if you are concerned about your fertility.  What side effects may I notice from receiving this medicine?  Side effects that you should report to your doctor or health care professional as soon as possible:  -allergic reactions like skin rash, itching or hives, swelling of the face, lips, or tongue  -low blood counts - this medicine may decrease the number of white blood cells, red blood cells and platelets. You may be at increased risk for infections and  bleeding.  -redness, blistering, peeling or loosening of the skin, including inside the mouth  -signs of infection - fever or chills, cough, sore throat, pain or difficulty passing urine  -signs of decreased platelets or bleeding - bruising, pinpoint red spots on the skin, black, tarry stools, blood in the urine  -signs of decreased red blood cells - unusually weak or tired, fainting spells, lightheadedness  -signs and symptoms of kidney injury like trouble passing urine or change in the amount of urine  Side effects that usually do not require medical attention (report to your doctor or health care professional if they

## 2015-07-05 NOTE — Procedures (Signed)
Interventional Radiology Procedure Note  Procedure: CT guided aspirate and core biopsy of right posterior iliac bone Complications: None Recommendations: - Bedrest supine x 1 hrs - OTC PRN  Pain - Follow biopsy results  Signed,  Dulcy Fanny. Earleen Newport, DO

## 2015-07-10 ENCOUNTER — Inpatient Hospital Stay: Payer: Medicare Other | Attending: Internal Medicine

## 2015-07-10 DIAGNOSIS — Z5112 Encounter for antineoplastic immunotherapy: Secondary | ICD-10-CM | POA: Insufficient documentation

## 2015-07-10 DIAGNOSIS — Z5111 Encounter for antineoplastic chemotherapy: Secondary | ICD-10-CM | POA: Insufficient documentation

## 2015-07-10 DIAGNOSIS — M069 Rheumatoid arthritis, unspecified: Secondary | ICD-10-CM | POA: Insufficient documentation

## 2015-07-10 DIAGNOSIS — M129 Arthropathy, unspecified: Secondary | ICD-10-CM | POA: Insufficient documentation

## 2015-07-10 DIAGNOSIS — R634 Abnormal weight loss: Secondary | ICD-10-CM | POA: Insufficient documentation

## 2015-07-10 DIAGNOSIS — E785 Hyperlipidemia, unspecified: Secondary | ICD-10-CM | POA: Insufficient documentation

## 2015-07-10 DIAGNOSIS — Z79899 Other long term (current) drug therapy: Secondary | ICD-10-CM | POA: Insufficient documentation

## 2015-07-10 DIAGNOSIS — K219 Gastro-esophageal reflux disease without esophagitis: Secondary | ICD-10-CM | POA: Insufficient documentation

## 2015-07-10 DIAGNOSIS — C8310 Mantle cell lymphoma, unspecified site: Secondary | ICD-10-CM | POA: Insufficient documentation

## 2015-07-10 DIAGNOSIS — R0602 Shortness of breath: Secondary | ICD-10-CM | POA: Insufficient documentation

## 2015-07-10 DIAGNOSIS — Z7689 Persons encountering health services in other specified circumstances: Secondary | ICD-10-CM | POA: Insufficient documentation

## 2015-07-10 DIAGNOSIS — K5900 Constipation, unspecified: Secondary | ICD-10-CM | POA: Insufficient documentation

## 2015-07-11 ENCOUNTER — Other Ambulatory Visit: Payer: Self-pay | Admitting: Internal Medicine

## 2015-07-11 DIAGNOSIS — C8311 Mantle cell lymphoma, lymph nodes of head, face, and neck: Secondary | ICD-10-CM

## 2015-07-12 ENCOUNTER — Inpatient Hospital Stay: Payer: Medicare Other

## 2015-07-12 ENCOUNTER — Other Ambulatory Visit: Payer: Self-pay | Admitting: Internal Medicine

## 2015-07-12 ENCOUNTER — Encounter: Payer: Self-pay | Admitting: Internal Medicine

## 2015-07-12 ENCOUNTER — Inpatient Hospital Stay (HOSPITAL_BASED_OUTPATIENT_CLINIC_OR_DEPARTMENT_OTHER): Payer: Medicare Other | Admitting: Internal Medicine

## 2015-07-12 VITALS — BP 142/82 | HR 84 | Temp 99.4°F | Resp 20 | Ht 66.0 in | Wt 194.3 lb

## 2015-07-12 VITALS — BP 100/65 | HR 91 | Resp 18

## 2015-07-12 DIAGNOSIS — M069 Rheumatoid arthritis, unspecified: Secondary | ICD-10-CM

## 2015-07-12 DIAGNOSIS — Z79899 Other long term (current) drug therapy: Secondary | ICD-10-CM

## 2015-07-12 DIAGNOSIS — C8310 Mantle cell lymphoma, unspecified site: Secondary | ICD-10-CM

## 2015-07-12 DIAGNOSIS — Z5111 Encounter for antineoplastic chemotherapy: Secondary | ICD-10-CM | POA: Diagnosis not present

## 2015-07-12 DIAGNOSIS — E785 Hyperlipidemia, unspecified: Secondary | ICD-10-CM | POA: Diagnosis not present

## 2015-07-12 DIAGNOSIS — M129 Arthropathy, unspecified: Secondary | ICD-10-CM | POA: Diagnosis not present

## 2015-07-12 DIAGNOSIS — R634 Abnormal weight loss: Secondary | ICD-10-CM | POA: Diagnosis not present

## 2015-07-12 DIAGNOSIS — C8313 Mantle cell lymphoma, intra-abdominal lymph nodes: Secondary | ICD-10-CM

## 2015-07-12 DIAGNOSIS — K219 Gastro-esophageal reflux disease without esophagitis: Secondary | ICD-10-CM

## 2015-07-12 DIAGNOSIS — Z7689 Persons encountering health services in other specified circumstances: Secondary | ICD-10-CM

## 2015-07-12 DIAGNOSIS — K5900 Constipation, unspecified: Secondary | ICD-10-CM | POA: Diagnosis not present

## 2015-07-12 DIAGNOSIS — Z5112 Encounter for antineoplastic immunotherapy: Secondary | ICD-10-CM | POA: Diagnosis not present

## 2015-07-12 DIAGNOSIS — R0602 Shortness of breath: Secondary | ICD-10-CM | POA: Diagnosis not present

## 2015-07-12 DIAGNOSIS — C8311 Mantle cell lymphoma, lymph nodes of head, face, and neck: Secondary | ICD-10-CM

## 2015-07-12 LAB — CBC WITH DIFFERENTIAL/PLATELET
Basophils Absolute: 0 10*3/uL (ref 0–0.1)
Basophils Relative: 0 %
Eosinophils Absolute: 0.1 10*3/uL (ref 0–0.7)
Eosinophils Relative: 1 %
HCT: 38.1 % (ref 35.0–47.0)
Hemoglobin: 12.8 g/dL (ref 12.0–16.0)
Lymphocytes Relative: 27 %
Lymphs Abs: 2.7 10*3/uL (ref 1.0–3.6)
MCH: 29.4 pg (ref 26.0–34.0)
MCHC: 33.6 g/dL (ref 32.0–36.0)
MCV: 87.5 fL (ref 80.0–100.0)
Monocytes Absolute: 0.4 10*3/uL (ref 0.2–0.9)
Monocytes Relative: 4 %
Neutro Abs: 6.7 10*3/uL — ABNORMAL HIGH (ref 1.4–6.5)
Neutrophils Relative %: 68 %
Platelets: 161 10*3/uL (ref 150–440)
RBC: 4.35 MIL/uL (ref 3.80–5.20)
RDW: 15.4 % — ABNORMAL HIGH (ref 11.5–14.5)
WBC: 10 10*3/uL (ref 3.6–11.0)

## 2015-07-12 LAB — COMPREHENSIVE METABOLIC PANEL
ALT: 12 U/L — ABNORMAL LOW (ref 14–54)
AST: 22 U/L (ref 15–41)
Albumin: 3.9 g/dL (ref 3.5–5.0)
Alkaline Phosphatase: 68 U/L (ref 38–126)
Anion gap: 7 (ref 5–15)
BUN: 19 mg/dL (ref 6–20)
CO2: 27 mmol/L (ref 22–32)
Calcium: 8.8 mg/dL — ABNORMAL LOW (ref 8.9–10.3)
Chloride: 103 mmol/L (ref 101–111)
Creatinine, Ser: 0.73 mg/dL (ref 0.44–1.00)
GFR calc Af Amer: >60 mL/min (ref >60)
GFR calc non Af Amer: >60 mL/min (ref >60)
Glucose, Bld: 100 mg/dL — ABNORMAL HIGH (ref 65–99)
Potassium: 3.9 mmol/L (ref 3.5–5.1)
Sodium: 137 mmol/L (ref 135–145)
Total Bilirubin: 0.8 mg/dL (ref 0.3–1.2)
Total Protein: 7.5 g/dL (ref 6.5–8.1)

## 2015-07-12 LAB — URIC ACID: Uric Acid, Serum: 6.4 mg/dL (ref 2.3–6.6)

## 2015-07-12 MED ORDER — SODIUM CHLORIDE 0.9 % IV SOLN
90.0000 mg/m2 | Freq: Once | INTRAVENOUS | Status: AC
  Administered 2015-07-12: 175 mg via INTRAVENOUS
  Filled 2015-07-12: qty 7

## 2015-07-12 MED ORDER — PROCHLORPERAZINE MALEATE 10 MG PO TABS: 10.0000 mg | ORAL_TABLET | Freq: Four times a day (QID) | ORAL | Status: DC | PRN

## 2015-07-12 MED ORDER — DIPHENHYDRAMINE HCL 25 MG PO CAPS
50.0000 mg | ORAL_CAPSULE | Freq: Once | ORAL | Status: AC
  Administered 2015-07-12: 50 mg via ORAL
  Filled 2015-07-12: qty 2

## 2015-07-12 MED ORDER — PALONOSETRON HCL INJECTION 0.25 MG/5ML
0.2500 mg | Freq: Once | INTRAVENOUS | Status: AC
  Administered 2015-07-12: 0.25 mg via INTRAVENOUS
  Filled 2015-07-12: qty 5

## 2015-07-12 MED ORDER — ACYCLOVIR 400 MG PO TABS: 400.0000 mg | ORAL_TABLET | Freq: Two times a day (BID) | ORAL | Status: DC

## 2015-07-12 MED ORDER — SODIUM CHLORIDE 0.9 % IV SOLN
Freq: Once | INTRAVENOUS | Status: AC
  Administered 2015-07-12: 10:00:00 via INTRAVENOUS
  Filled 2015-07-12: qty 1000

## 2015-07-12 MED ORDER — SODIUM CHLORIDE 0.9 % IV SOLN
10.0000 mg | Freq: Once | INTRAVENOUS | Status: AC
  Administered 2015-07-12: 10 mg via INTRAVENOUS
  Filled 2015-07-12: qty 1

## 2015-07-12 MED ORDER — ACETAMINOPHEN 325 MG PO TABS
650.0000 mg | ORAL_TABLET | Freq: Once | ORAL | Status: AC
  Administered 2015-07-12: 650 mg via ORAL
  Filled 2015-07-12: qty 2

## 2015-07-12 MED ORDER — ALLOPURINOL 300 MG PO TABS: 300.0000 mg | ORAL_TABLET | Freq: Two times a day (BID) | ORAL | Status: DC

## 2015-07-12 MED ORDER — HEPARIN SOD (PORK) LOCK FLUSH 100 UNIT/ML IV SOLN
500.0000 [IU] | Freq: Once | INTRAVENOUS | Status: AC | PRN
  Administered 2015-07-12: 500 [IU]
  Filled 2015-07-12: qty 5

## 2015-07-12 MED ORDER — OMEPRAZOLE 40 MG PO CPDR: DELAYED_RELEASE_CAPSULE | ORAL | Status: DC

## 2015-07-12 MED ORDER — SODIUM CHLORIDE 0.9% FLUSH
10.0000 mL | INTRAVENOUS | Status: DC | PRN
  Administered 2015-07-12: 10 mL
  Filled 2015-07-12: qty 10

## 2015-07-12 MED ORDER — ONDANSETRON HCL 8 MG PO TABS: 8.0000 mg | ORAL_TABLET | Freq: Three times a day (TID) | ORAL | Status: DC | PRN

## 2015-07-12 MED ORDER — SODIUM CHLORIDE 0.9 % IV SOLN
375.0000 mg/m2 | Freq: Once | INTRAVENOUS | Status: AC
  Administered 2015-07-12: 800 mg via INTRAVENOUS
  Filled 2015-07-12: qty 70

## 2015-07-12 MED ORDER — AMOXICILLIN-POT CLAVULANATE 875-125 MG PO TABS: 1.0000 | ORAL_TABLET | Freq: Two times a day (BID) | ORAL | Status: DC

## 2015-07-12 NOTE — H&P (Signed)
[facility] Admission History & Physical  MRN : [id number]  Michelle [name] is a 75 y.o. ([birth year]) female who presents with chief complaint of No chief complaint on file. [name]  History of Present Illness: Patient is referred for evaluation for Port-A-Cath placement for lymphoma. 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 She has had no previous venous access device is to her knowledge. She has adenopathy and some discomfort particularly in her neck. Otherwise she has no new complaints.  No current facility-administered medications for this encounter.   Current Outpatient Prescriptions  Medication Sig Dispense Refill  . Calcium Carbonate-Vit D-Min (CALTRATE 600+D PLUS) 600-800 MG-UNIT CHEW Chew 1 tablet by mouth 2 (two) times daily. 60 tablet 6  . folic acid (FOLVITE) 1 MG tablet Take 1 mg by mouth. Reported on 07/12/2015    . hydroxychloroquine (PLAQUENIL) 200 MG tablet Take 400 mg by mouth daily. Reported on 07/12/2015  1  . lidocaine-prilocaine (EMLA) cream Apply 1 application topically as needed. 30 g 11  . meloxicam (MOBIC) 15 MG tablet TAKE 1 TABLET (15 MG TOTAL) BY MOUTH DAILY WITH MEAL 30 tablet 3  . Multiple Vitamins-Minerals (MULTIVITAMIN GUMMIES ADULT) CHEW Chew by mouth. Reported on 07/12/2015    . NON FORMULARY Reported on 07/12/2015    . traMADol (ULTRAM) 50 MG tablet Take 50 mg by mouth every morning.    . vitamin E 400 UNIT capsule Take 400 Units by mouth daily.    [name] acyclovir (ZOVIRAX) 400 MG tablet Take 1 tablet (400 mg total) by mouth 2 (two) times daily. (Patient not taking: Reported on 07/12/2015) 120 tablet 2  . allopurinol (ZYLOPRIM) 300 MG tablet Take 1 tablet (300 mg total) by mouth 2 (two) times daily. (Patient not taking: Reported on 07/12/2015) 120 tablet 0  . amoxicillin-clavulanate (AUGMENTIN) 875-125 MG tablet Take 1 tablet by mouth 2 (two) times daily. 20 tablet 0  . omeprazole (PRILOSEC) 40 MG capsule  TAKE ONE CAPSULE EVERY DAY 30 capsule 5  . ondansetron (ZOFRAN) 8 MG tablet Take 1 tablet (8 mg total) by mouth every 8 (eight) hours as needed for nausea or vomiting (start 3 days; after chemo). (Patient not taking: Reported on 07/12/2015) 40 tablet 0  . prochlorperazine (COMPAZINE) 10 MG tablet Take 1 tablet (10 mg total) by mouth every 6 (six) hours as needed for nausea or vomiting. (Patient not taking: Reported on 07/12/2015) 30 tablet 0   Facility-Administered Medications Ordered in Other Encounters  Medication Dose Route Frequency Provider Last Rate Last Dose  . sodium chloride flush (NS) 0.9 % injection 10 mL  10 mL Intracatheter PRN Cammie Sickle, MD   10 mL at 07/12/15 0900    Past Medical History  Diagnosis Date  . Arthritis   . Headache(784.0)   . GERD (gastroesophageal reflux disease)   . Hyperlipidemia     hx  . Lymphoma, mantle cell ([place]) 06/01/2015    bx of lymph node in right breast  . Lymphadenopathy of head and neck 01/2015    see on Thyroid ultrasound  . Rheumatoid arthritis ([place])   . History of methotrexate therapy     Past Surgical History  Procedure Laterality Date  . Cardiac catheterization  08/2004    [facility]  . Peripheral vascular catheterization N/A 07/04/2015    Procedure: [name] Insertion;  Surgeon: [name], MD;  Location: [facility];  Service: Cardiovascular;  Laterality: N/A;  Social History Social History  Substance Use Topics  . Smoking status: Never Smoker   . Smokeless tobacco: Never Used  . Alcohol Use: No  No IVDU  Family History Family History  Problem Relation Age of Onset  . Diabetes Mother   . Cholelithiasis Mother   . Hypertension Sister   . Diabetes Sister   . Arthritis Brother     No Known Allergies   REVIEW OF SYSTEMS (Negative unless checked)  Constitutional: '[]' Weight loss  '[]' Fever  '[]' Chills Cardiac: '[]' Chest pain   '[]' Chest pressure   '[]' Palpitations   '[]' Shortness of breath when laying flat    '[]' Shortness of breath at rest   '[]' Shortness of breath with exertion. Vascular:  '[]' Pain in legs with walking   '[]' Pain in legs at rest   '[]' Pain in legs when laying flat   '[]' Claudication   '[]' Pain in feet when walking  '[]' Pain in feet at rest  '[]' Pain in feet when laying flat   '[]' History of DVT   '[]' Phlebitis   '[]' Swelling in legs   '[]' Varicose veins   '[]' Non-healing ulcers Pulmonary:   '[]' Uses home oxygen   '[]' Productive cough   '[]' Hemoptysis   '[]' Wheeze  '[]' COPD   '[]' Asthma Neurologic:  '[]' Dizziness  '[]' Blackouts   '[]' Seizures   '[]' History of stroke   '[]' History of TIA  '[]' Aphasia   '[]' Temporary blindness   '[]' Dysphagia   '[]' Weakness or numbness in arms   '[]' Weakness or numbness in legs Musculoskeletal:  '[]' Arthritis   '[]' Joint swelling   '[x]' Joint pain   '[]' Low back pain Hematologic:  '[]' Easy bruising  '[]' Easy bleeding   '[]' Hypercoagulable state   '[x]' Anemic  '[]' Hepatitis Gastrointestinal:  '[]' Blood in stool   '[]' Vomiting blood  '[x]' Gastroesophageal reflux/heartburn   '[]' Difficulty swallowing. Genitourinary:  '[]' Chronic kidney disease   '[]' Difficult urination  '[]' Frequent urination  '[]' Burning with urination   '[]' Blood in urine Skin:  '[]' Rashes   '[]' Ulcers   '[]' Wounds Psychological:  '[]' History of anxiety   '[]'  History of major depression.  Physical Examination  Filed Vitals:   07/04/15 0924 07/04/15 1130 07/04/15 1145 07/04/15 1200  BP: 120/74 119/70 88/78 104/82  Pulse: 80 81  85  Temp: 98.2 F (36.8 C)     TempSrc: Oral     Resp:  '18 17 22  ' Height: '5\' 7"'  (1.702 m)     Weight: 91.627 kg (202 lb)     SpO2: 95% 100% 100% 100%   Body mass index is 31.63 kg/(m^2). Gen: WD/WN, NAD Head: [place]/AT, No temporalis wasting. Prominent temp pulse not noted. Ear/Nose/Throat: Hearing grossly intact, nares w/o erythema or drainage, oropharynx w/o Erythema/Exudate,  Eyes: PERRLA, EOMI.  Neck: Supple, no nuchal rigidity.  No bruit or JVD.  Pulmonary:  Good air movement, clear to auscultation bilaterally, no use of accessory muscles.  Cardiac: RRR,  normal S1, S2, no Murmurs, rubs or gallops. Vascular:  Vessel Right Left  Radial Palpable Palpable  Ulnar Palpable Palpable  Brachial Palpable Palpable  Carotid Palpable, without bruit Palpable, without bruit  Aorta Not palpable N/A  Femoral Palpable Palpable  Popliteal Palpable Palpable  PT Palpable Palpable  DP Palpable Palpable   Gastrointestinal: soft, non-tender/non-distended. No guarding/reflex.  Musculoskeletal: M/S 5/5 throughout.  Extremities without ischemic changes.  No deformity or atrophy.  Neurologic: CN 2-12 intact. Pain and light touch intact in extremities.  Symmetrical.  Speech is fluent. Motor exam as listed above. Psychiatric: Judgment intact, Mood & affect appropriate for pt's clinical situation. Dermatologic: No rashes or ulcers noted.  No cellulitis or open wounds.  Lymph : positive for cervical adenopathy     CBC Lab Results  Component Value Date   WBC 10.0 07/12/2015   HGB 12.8 07/12/2015   HCT 38.1 07/12/2015   MCV 87.5 07/12/2015   PLT 161 07/12/2015    BMET    Component Value Date/Time   NA 137 07/12/2015 0832   NA 142 09/14/2013 1127   K 3.9 07/12/2015 0832   K 3.5 09/14/2013 1127   CL 103 07/12/2015 0832   CL 110* 09/14/2013 1127   CO2 27 07/12/2015 0832   CO2 29 09/14/2013 1127   GLUCOSE 100* 07/12/2015 0832   GLUCOSE 106* 09/14/2013 1127   BUN 19 07/12/2015 0832   BUN 8 09/14/2013 1127   CREATININE 0.73 07/12/2015 0832   CREATININE 0.71 09/14/2013 1127   CREATININE 0.68 04/01/2013 1550   CALCIUM 8.8* 07/12/2015 0832   CALCIUM 8.6 09/14/2013 1127   GFRNONAA >60 07/12/2015 0832   GFRNONAA >60 09/14/2013 1127   GFRAA >60 07/12/2015 0832   GFRAA >60 09/14/2013 1127   Estimated Creatinine Clearance: 73.9 mL/min (by C-G formula based on Cr of 0.73).  COAG No results found for: INR, PROTIME  Radiology Nm Pet Image Initial (pi) Skull Base To Thigh  06/20/2015  CLINICAL DATA:  Initial treatment strategy for mantle cell lymphoma.  EXAM: NUCLEAR MEDICINE PET SKULL BASE TO THIGH TECHNIQUE: 12.5 mCi F-18 FDG was injected intravenously. Full-ring PET imaging was performed from the skull base to thigh after the radiotracer. CT data was obtained and used for attenuation correction and anatomic localization. FASTING BLOOD GLUCOSE:  Value: 80 mg/dl COMPARISON:  None. FINDINGS: NECK There is hypermetabolism associated with the oro/nasopharynx with possible soft tissue thickening on CT. Parotid glands and submandibular glands are also hypermetabolic. Hypermetabolic lymph nodes are seen predominantly in the level 2 stations bilaterally. Index 1.0 cm short axis lymph node on the right (CT image 36) has an SUV max of 6.5. CT images show no acute findings. Mucosal thickening in the right maxillary sinus. CHEST There are hypermetabolic mediastinal, hilar and axillary lymph nodes. Index low right paratracheal lymph node measures 1.4 cm (CT image 81) with an SUV max of 4.2. Left hilar hypermetabolism has an SUV max of 4.9. A discrete lymph node is not well seen on CT, possibly due to lack of IV contrast. Bilateral subpectoral and axillary adenopathy is hypermetabolic with an index 8 mm lymph node on the left (CT image 79) with an SUV max of 4.9. Areas of subpleural consolidation in the left upper, left lower and right lower lobes show mild hypermetabolism. CT images show no pericardial or pleural effusion. ABDOMEN/PELVIS Hypermetabolic lymph nodes are seen in the abdominal peritoneal ligaments bilaterally. Discrete measurements of lymph nodes in the porta hepatis region is difficult without IV contrast. SUV max in this region is approximately 5.9. Hypermetabolic lymph nodes are seen along the iliac chains and inguinal regions bilaterally. Proximal left common iliac lymph node measures 1.2 cm (CT image 175) with an SUV max of 4.0. Index left inguinal lymph node measures 1.3 cm (CT image 219) with an SUV max of 4.9. Liver, gallbladder, adrenal glands, kidneys,  spleen, pancreas, stomach and bowel are otherwise grossly unremarkable. No free fluid. SKELETON No abnormal osseous hypermetabolism. IMPRESSION: 1. Hypermetabolic adenopathy in the neck, chest, abdomen and pelvis, consistent with the given history of lymphoma. 2. Oral/nasopharyngeal hypermetabolism can be seen in the setting of lymphoma. Please correlate clinically. 3. Hypermetabolism within subpleural consolidation in the left upper, left lower  and right lower lobes may be infectious or inflammatory in etiology. Pulmonary lymphoma is not excluded Electronically Signed   By: Lorin Picket M.D.   On: 06/20/2015 11:19   Ct Biopsy  07/05/2015  CLINICAL DATA:  75 year old female with a history of mantle cell lymphoma. She has been referred for bone marrow biopsy. EXAM: CT-GUIDED BIOPSY BONE MARROW MEDICATIONS AND MEDICAL HISTORY: Versed 3.5 mg, Fentanyl 100 mcg. Additional Medications: None. The patient's level of consciousness and physiologic status monitored by Radiology Nursing. ANESTHESIA/SEDATION: Moderate sedation time: 14 minutes PROCEDURE: The procedure risks, benefits, and alternatives were explained to the patient. Questions regarding the procedure were encouraged and answered. The patient understands and consents to the procedure. Scout CT of the pelvis was performed for surgical planning purposes. The posterior pelvis was prepped with Betadinein a sterile fashion, and a sterile drape was applied covering the operative field. A sterile gown and sterile gloves were used for the procedure. Local anesthesia was provided with 1% Lidocaine. We targeted the right posterior iliac bone for biopsy. The skin and subcutaneous tissues were infiltrated with 1% lidocaine without epinephrine. A small stab incision was made with an 11 blade scalpel, and an 11 gauge Murphy needle was advanced with CT guidance to the posterior cortex. Manual forced was used to advance the needle through the posterior cortex and the stylet  was removed. A bone marrow aspirate was retrieved and passed to a cytotechnologist in the room. The Murphy needle was then advanced without the stylet for a core biopsy. The core biopsy was retrieved and also passed to a cytotechnologist. Manual pressure was used for hemostasis and a sterile dressing was placed. No complications were encountered no significant blood loss was encountered. Patient tolerated the procedure well and remained hemodynamically stable throughout. FINDINGS: Scout image demonstrates safe approach to posterior iliac bone. Images during the case demonstrate placement of 11 gauge Murphy needle COMPLICATIONS: None IMPRESSION: Status post CT-guided bone marrow biopsy, with tissue specimen sent to pathology for complete histopathologic analysis Signed, Dulcy Fanny. Earleen Newport, DO [facility] Radiology Electronically Signed   By: Corrie Mckusick D.O.   On: 07/05/2015 11:18      Assessment/Plan 1. Mantle cell lymphoma.  Needs port for chemotherapy.  Risks and benefits discussed 2. Rheumatoid arthritis. Stable on outpatient meds 3. GERD. Stable.   DEW,JASON, MD  07/12/2015 3:43 PM

## 2015-07-12 NOTE — Patient Instructions (Signed)
Please drink plenty of fluids on a daily basis to maintain hydration.    Rituximab injection What is this medicine? RITUXIMAB (ri TUX i mab) is a monoclonal antibody. It is used commonly to treat non-Hodgkin lymphoma and other conditions. It is also used to treat rheumatoid arthritis (RA). In RA, this medicine slows the inflammatory process and help reduce joint pain and swelling. This medicine is often used with other cancer or arthritis medications. This medicine may be used for other purposes; ask your health care provider or pharmacist if you have questions. What should I tell my health care provider before I take this medicine? They need to know if you have any of these conditions: -blood disorders -heart disease -history of hepatitis B -infection (especially a virus infection such as chickenpox, cold sores, or herpes) -irregular heartbeat -kidney disease -lung or breathing disease, like asthma -lupus -an unusual or allergic reaction to rituximab, mouse proteins, other medicines, foods, dyes, or preservatives -pregnant or trying to get pregnant -breast-feeding How should I use this medicine? This medicine is for infusion into a vein. It is administered in a hospital or clinic by a specially trained health care professional. A special MedGuide will be given to you by the pharmacist with each prescription and refill. Be sure to read this information carefully each time. Talk to your pediatrician regarding the use of this medicine in children. This medicine is not approved for use in children. Overdosage: If you think you have taken too much of this medicine contact a poison control center or emergency room at once. NOTE: This medicine is only for you. Do not share this medicine with others. What if I miss a dose? It is important not to miss a dose. Call your doctor or health care professional if you are unable to keep an appointment. What may interact with this  medicine? -cisplatin -medicines for blood pressure -some other medicines for arthritis -vaccines This list may not describe all possible interactions. Give your health care provider a list of all the medicines, herbs, non-prescription drugs, or dietary supplements you use. Also tell them if you smoke, drink alcohol, or use illegal drugs. Some items may interact with your medicine. What should I watch for while using this medicine? Report any side effects that you notice during your treatment right away, such as changes in your breathing, fever, chills, dizziness or lightheadedness. These effects are more common with the first dose. Visit your prescriber or health care professional for checks on your progress. You will need to have regular blood work. Report any other side effects. The side effects of this medicine can continue after you finish your treatment. Continue your course of treatment even though you feel ill unless your doctor tells you to stop. Call your doctor or health care professional for advice if you get a fever, chills or sore throat, or other symptoms of a cold or flu. Do not treat yourself. This drug decreases your body's ability to fight infections. Try to avoid being around people who are sick. This medicine may increase your risk to bruise or bleed. Call your doctor or health care professional if you notice any unusual bleeding. Be careful brushing and flossing your teeth or using a toothpick because you may get an infection or bleed more easily. If you have any dental work done, tell your dentist you are receiving this medicine. Avoid taking products that contain aspirin, acetaminophen, ibuprofen, naproxen, or ketoprofen unless instructed by your doctor. These medicines may hide a  fever. Do not become pregnant while taking this medicine. Women should inform their doctor if they wish to become pregnant or think they might be pregnant. There is a potential for serious side effects  to an unborn child. Talk to your health care professional or pharmacist for more information. Do not breast-feed an infant while taking this medicine. What side effects may I notice from receiving this medicine? Side effects that you should report to your doctor or health care professional as soon as possible: -allergic reactions like skin rash, itching or hives, swelling of the face, lips, or tongue -low blood counts - this medicine may decrease the number of white blood cells, red blood cells and platelets. You may be at increased risk for infections and bleeding. -signs of infection - fever or chills, cough, sore throat, pain or difficulty passing urine -signs of decreased platelets or bleeding - bruising, pinpoint red spots on the skin, black, tarry stools, blood in the urine -signs of decreased red blood cells - unusually weak or tired, fainting spells, lightheadedness -breathing problems -confused, not responsive -chest pain -fast, irregular heartbeat -feeling faint or lightheaded, falls -mouth sores -redness, blistering, peeling or loosening of the skin, including inside the mouth -stomach pain -swelling of the ankles, feet, or hands -trouble passing urine or change in the amount of urine Side effects that usually do not require medical attention (report to your doctor or other health care professional if they continue or are bothersome): -anxiety -headache -loss of appetite -muscle aches -nausea -night sweats This list may not describe all possible side effects. Call your doctor for medical advice about side effects. You may report side effects to FDA at 1-800-FDA-1088. Where should I keep my medicine? This drug is given in a hospital or clinic and will not be stored at home. NOTE: This sheet is a summary. It may not cover all possible information. If you have questions about this medicine, talk to your doctor, pharmacist, or health care provider.    2016, Elsevier/[employer]  Standard. (2014-07-26 22:30:56)   Bendamustine Injection What is this medicine? BENDAMUSTINE (BEN da MUS teen) is a chemotherapy drug. It is used to treat chronic lymphocytic leukemia and non-Hodgkin lymphoma. This medicine may be used for other purposes; ask your health care provider or pharmacist if you have questions. What should I tell my health care provider before I take this medicine? They need to know if you have any of these conditions: -infection (especially a virus infection such as chickenpox, cold sores, or herpes) -kidney disease -liver disease -an unusual or allergic reaction to bendamustine, mannitol, other medicines, foods, dyes, or preservatives -pregnant or trying to get pregnant -breast-feeding How should I use this medicine? This medicine is for infusion into a vein. It is given by a health care professional in a hospital or clinic setting. Talk to your pediatrician regarding the use of this medicine in children. Special care may be needed. Overdosage: If you think you have taken too much of this medicine contact a poison control center or emergency room at once. NOTE: This medicine is only for you. Do not share this medicine with others. What if I miss a dose? It is important not to miss your dose. Call your doctor or health care professional if you are unable to keep an appointment. What may interact with this medicine? Do not take this medicine with any of the following medications: -clozapine This medicine may also interact with the following medications: -atazanavir -cimetidine -ciprofloxacin -enoxacin -fluvoxamine -medicines  for seizures like carbamazepine and phenobarbital -mexiletine -rifampin -tacrine -thiabendazole -zileuton This list may not describe all possible interactions. Give your health care provider a list of all the medicines, herbs, non-prescription drugs, or dietary supplements you use. Also tell them if you smoke, drink alcohol, or use  illegal drugs. Some items may interact with your medicine. What should I watch for while using this medicine? This drug may make you feel generally unwell. This is not uncommon, as chemotherapy can affect healthy cells as well as cancer cells. Report any side effects. Continue your course of treatment even though you feel ill unless your doctor tells you to stop. You may need blood work done while you are taking this medicine. Call your doctor or health care professional for advice if you get a fever, chills or sore throat, or other symptoms of a cold or flu. Do not treat yourself. This drug decreases your body's ability to fight infections. Try to avoid being around people who are sick. This medicine may increase your risk to bruise or bleed. Call your doctor or health care professional if you notice any unusual bleeding. Talk to your doctor about your risk of cancer. You may be more at risk for certain types of cancers if you take this medicine. Do not become pregnant while taking this medicine or for 3 months after stopping it. Women should inform their doctor if they wish to become pregnant or think they might be pregnant. Men should not father a child while taking this medicine and for 3 months after stopping it.There is a potential for serious side effects to an unborn child. Talk to your health care professional or pharmacist for more information. Do not breast-feed an infant while taking this medicine. This medicine may interfere with the ability to have a child. You should talk with your doctor or health care professional if you are concerned about your fertility. What side effects may I notice from receiving this medicine? Side effects that you should report to your doctor or health care professional as soon as possible: -allergic reactions like skin rash, itching or hives, swelling of the face, lips, or tongue -low blood counts - this medicine may decrease the number of white blood cells, red  blood cells and platelets. You may be at increased risk for infections and bleeding. -redness, blistering, peeling or loosening of the skin, including inside the mouth -signs of infection - fever or chills, cough, sore throat, pain or difficulty passing urine -signs of decreased platelets or bleeding - bruising, pinpoint red spots on the skin, black, tarry stools, blood in the urine -signs of decreased red blood cells - unusually weak or tired, fainting spells, lightheadedness -signs and symptoms of kidney injury like trouble passing urine or change in the amount of urine Side effects that usually do not require medical attention (report to your doctor or health care professional if they continue or are bothersome): -constipation -decreased appetite -diarrhea -headache -mouth sores -nausea/vomiting -tiredness This list may not describe all possible side effects. Call your doctor for medical advice about side effects. You may report side effects to FDA at 1-800-FDA-1088. Where should I keep my medicine? This drug is given in a hospital or clinic and will not be stored at home. NOTE: This sheet is a summary. It may not cover all possible information. If you have questions about this medicine, talk to your doctor, pharmacist, or health care provider.    2016, [employer] Standard. (2014-05-12 13:53:28)    Chemotherapy  Chemotherapy is the use of medicines to stop or slow the growth of cancer cells. Depending on the type and stage of your cancer, you may have chemotherapy to:  Cure your cancer.  Slow the progression of your cancer.  Ease your cancer symptoms.  Improve the benefits of radiation treatment.  Shrink a tumor before surgery.  Rid the body of cancer cells that remain after a tumor is surgically removed. HOW IS CHEMOTHERAPY GIVEN? Chemotherapy may be given:  By mouth in liquid or pill form.  Through a thin tube that is inserted into a vein or artery.  By getting a  shot.  By rubbing a cream or ointment on your skin.  Through liquids that are placed directly into various areas of the body, such as the abdomen, chest, or bladder. HOW OFTEN IS CHEMOTHERAPY GIVEN? Chemotherapy may be given continuously over time, or it may be given in cycles. For example, you may take the medicine for one week out of every month. FOR HOW LONG WILL I NEED CHEMOTHERAPY TREATMENTS? The length of treatment depends on many factors, including:  The type of cancer.  Whether the cancer has spread.  How you respond to the chemotherapy.  Whether you develop side effects. Some types of chemotherapy medicine are given only one time. Others are given for months, years, or for life. WHAT SAFETY PRECAUTIONS MUST I TAKE WHILE ON CHEMOTHERAPY? Chemotherapy medicines are very strong. They will be in all of your bodily fluids, including your urine, stool, saliva, sweat, tears, vaginal secretions, and semen. You must carefully follow some safety precautions to prevent harm to others while you are using these medicines. Here are some recommended precautions:  Do not throw away extra medicine, and do not flush it down the toilet. Take medicine that you are not going to use to your health care provider's office where it can be disposed of properly.  Follow your health care provider's directions for the proper disposal of needles, IV tubing, and other medical supplies that have come into contact with your chemotherapy medicines.  If you are issued a hazardous waste container, make sure you understand the directions for using it.  Wash your hands thoroughly with warm water and soap after using the bathroom. Dry your hands with disposable paper towels.  When using the toilet:  Flush it twice after each use, including after vomiting.  Close the lid of the toilet prior to flushing. This helps to avoid splashing.  Both men and women should sit to use the toilet. This helps avoid  splashing. WHAT ARE THE SIDE EFFECTS OF CHEMOTHERAPY? Side effects depend on a variety of factors, including:  The specific type of chemotherapy medicine used.  The dosage.  How long the medicine is used for.  Your overall health. Some of the side effects you may experience include:  Fatigue and decreased energy.  Decreased appetite.  Changes in your sense of smell or taste.  Nausea.  Vomiting.  Constipation or diarrhea.  Hair loss.  Increased susceptibility to infection.  Easy bleeding.  Mouth sores.  Burning or tingling in the hands or feet.  Memory problems.   This information is not intended to replace advice given to you by your health care provider. Make sure you discuss any questions you have with your health care provider.   Document Released: 03/16/2007 Document Revised: 06/09/2014 Document Reviewed: 10/25/2013 Elsevier Interactive Patient Education [employer].

## 2015-07-12 NOTE — Progress Notes (Signed)
[place] NOTE  Patient Care Team: Jackolyn Confer, MD as PCP - General (Internal Medicine) Jackolyn Confer, MD (Internal Medicine)  CHIEF COMPLAINTS/PURPOSE OF CONSULTATION: MANTLE CELL LYMPHOMA  # JAN 2017- MANTLE CELL LYMPHOMA STAGE IV; [R Breast LN [place] Core Bx-1.2cm LN/R Ax LN-Bx]; cyclin D Pos; Mitotic rate-LOW; MIPI score [5/intermediate risk]; BMBx-Positive for involvement. Feb 9th- START Benda-Ritux  # Rheumatoid Arthritis [on MXT]  HISTORY OF PRESENTING ILLNESS:  Michelle [name] 75 y.o.  female with with a new diagnosis of mantle cell lymphoma  Stage IV is here to start chemotherapy cycle #1.   The interim patient underwent  Mediport placement.  Patient also had a bone marrow biopsy.   Patient complains of left upper tooth pain started about 2 days ago-  Current improving after she rinsed with hydrogen peroxide. It  No fevers.   She continues to complain of weight loss. Intermittent constipation-  For which she takes magnesium supplement. Shortness of breath with exertion.  Patient denies any ongoing headaches; denies any vision disturbance or neck pain; or falls. Denies any neuropathy. She is chronic arthritis.  ROS: A complete 10 point review of system is done which is negative except mentioned above in history of present illness  MEDICAL HISTORY:  Past Medical History  Diagnosis Date  . Arthritis   . Headache(784.0)   . GERD (gastroesophageal reflux disease)   . Hyperlipidemia     hx  . Lymphoma, mantle cell ([place]) 06/01/2015    bx of lymph node in right breast  . Lymphadenopathy of head and neck 01/2015    see on Thyroid ultrasound  . Rheumatoid arthritis ([place])   . History of methotrexate therapy     SURGICAL HISTORY: Past Surgical History  Procedure Laterality Date  . Cardiac catheterization  08/2004    [facility]  . Peripheral vascular catheterization N/A 07/04/2015    Procedure: [name] Insertion;  Surgeon: [name], MD;   Location: [facility];  Service: Cardiovascular;  Laterality: N/A;    SOCIAL HISTORY: Social History   Social History  . Marital Status: Married    Spouse Name: N/A  . Number of Children: N/A  . Years of Education: N/A   Occupational History  . Not on file.   Social History Main Topics  . Smoking status: Never Smoker   . Smokeless tobacco: Never Used  . Alcohol Use: No  . Drug Use: No  . Sexual Activity: Not on file   Other Topics Concern  . 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    FAMILY HISTORY: Family History  Problem Relation Age of Onset  . Diabetes Mother   . Cholelithiasis Mother   . Hypertension Sister   . Diabetes Sister   . Arthritis Brother     ALLERGIES:  has No Known Allergies.  MEDICATIONS:  Current Outpatient Prescriptions  Medication Sig Dispense Refill  . Calcium Carbonate-Vit D-Min (CALTRATE 600+D PLUS) 600-800 MG-UNIT CHEW Chew 1 tablet by mouth 2 (two) times daily. 60 tablet 6  . lidocaine-prilocaine (EMLA) cream Apply 1 application topically as needed. 30 g 11  . omeprazole (PRILOSEC) 40 MG capsule TAKE ONE CAPSULE EVERY DAY 30 capsule 5  . traMADol (ULTRAM) 50 MG tablet Take 50 mg by mouth every morning.    . vitamin E 400 UNIT capsule Take 400 Units by mouth daily.    [name] acyclovir (ZOVIRAX) 400 MG  tablet Take 1 tablet (400 mg total) by mouth 2 (two) times daily. (Patient not taking: Reported on 07/12/2015) 120 tablet 2  . allopurinol (ZYLOPRIM) 300 MG tablet Take 1 tablet (300 mg total) by mouth 2 (two) times daily. (Patient not taking: Reported on 07/12/2015) 120 tablet 0  . amoxicillin-clavulanate (AUGMENTIN) 875-125 MG tablet Take 1 tablet by mouth 2 (two) times daily. 20 tablet 0  . folic acid (FOLVITE) 1 MG tablet Take 1 mg by mouth. Reported on 07/12/2015    . hydroxychloroquine (PLAQUENIL) 200 MG tablet Take 400 mg by mouth daily. Reported on 07/12/2015  1  . meloxicam (MOBIC) 15 MG  tablet TAKE 1 TABLET (15 MG TOTAL) BY MOUTH DAILY WITH MEAL (Patient not taking: Reported on 07/12/2015) 30 tablet 3  . Multiple Vitamins-Minerals (MULTIVITAMIN GUMMIES ADULT) CHEW Chew by mouth. Reported on 07/12/2015    . NON FORMULARY Reported on 07/12/2015    . ondansetron (ZOFRAN) 8 MG tablet Take 1 tablet (8 mg total) by mouth every 8 (eight) hours as needed for nausea or vomiting (start 3 days; after chemo). (Patient not taking: Reported on 07/12/2015) 40 tablet 0  . prochlorperazine (COMPAZINE) 10 MG tablet Take 1 tablet (10 mg total) by mouth every 6 (six) hours as needed for nausea or vomiting. (Patient not taking: Reported on 07/12/2015) 30 tablet 0   No current facility-administered medications for this visit.      [name]  PHYSICAL EXAMINATION: ECOG PERFORMANCE STATUS: 0 - Asymptomatic  Filed Vitals:   07/12/15 0905  BP: 142/82  Pulse: 84  Temp: 99.4 F (37.4 C)  Resp: 20   Filed Weights   07/12/15 0856  Weight: 194 lb 5.4 oz (88.15 kg)    GENERAL: Well-nourished well-developed; Alert, no distress and comfortable. accompanied by her husband.  EYES: no pallor or icterus OROPHARYNX: no thrush or ulceration; good dentition  NECK: supple, no masses felt LYMPH: Palpable lymphadenopathy in the cervical, axillary [2-3cm] ; inguinal regions LUNGS: clear to auscultation and No wheeze or crackles HEART/CVS: regular rate & rhythm and no murmurs; No lower extremity edema ABDOMEN: abdomen soft, non-tender and normal bowel sounds; positive for hepatomegaly.  Musculoskeletal:no cyanosis of digits and no clubbing;  PSYCH: alert & oriented x 3 with fluent speech NEURO: no focal motor/sensory deficits SKIN: no rashes or significant lesions;  Mediport accessed.  LABORATORY DATA:  I have reviewed the data as listed Lab Results  Component Value Date   WBC 10.0 07/12/2015   HGB 12.8 07/12/2015   HCT 38.1 07/12/2015   MCV 87.5 07/12/2015   PLT 161 07/12/2015    Recent Labs   06/14/15 0942 07/12/15 0832  NA 136 137  K 3.3* 3.9  CL 105 103  CO2 27 27  GLUCOSE 101* 100*  BUN 12 19  CREATININE 0.62 0.73  CALCIUM 8.9 8.8*  GFRNONAA >60 >60  GFRAA >60 >60  PROT 7.1 7.5  ALBUMIN 3.8 3.9  AST 25 22  ALT 16 12*  ALKPHOS 66 68  BILITOT 0.7 0.8    RADIOGRAPHIC STUDIES: I have personally reviewed the radiological images as listed and agreed with the findings in the report. Nm Pet Image Initial (pi) Skull Base To Thigh  06/20/2015  CLINICAL DATA:  Initial treatment strategy for mantle cell lymphoma. EXAM: NUCLEAR MEDICINE PET SKULL BASE TO THIGH TECHNIQUE: 12.5 mCi F-18 FDG was injected intravenously. Full-ring PET imaging was performed from the skull base to thigh after the radiotracer. CT data was obtained and used for  attenuation correction and anatomic localization. FASTING BLOOD GLUCOSE:  Value: 80 mg/dl COMPARISON:  None. FINDINGS: NECK There is hypermetabolism associated with the oro/nasopharynx with possible soft tissue thickening on CT. Parotid glands and submandibular glands are also hypermetabolic. Hypermetabolic lymph nodes are seen predominantly in the level 2 stations bilaterally. Index 1.0 cm short axis lymph node on the right (CT image 36) has an SUV max of 6.5. CT images show no acute findings. Mucosal thickening in the right maxillary sinus. CHEST There are hypermetabolic mediastinal, hilar and axillary lymph nodes. Index low right paratracheal lymph node measures 1.4 cm (CT image 81) with an SUV max of 4.2. Left hilar hypermetabolism has an SUV max of 4.9. A discrete lymph node is not well seen on CT, possibly due to lack of IV contrast. Bilateral subpectoral and axillary adenopathy is hypermetabolic with an index 8 mm lymph node on the left (CT image 79) with an SUV max of 4.9. Areas of subpleural consolidation in the left upper, left lower and right lower lobes show mild hypermetabolism. CT images show no pericardial or pleural effusion. ABDOMEN/PELVIS  Hypermetabolic lymph nodes are seen in the abdominal peritoneal ligaments bilaterally. Discrete measurements of lymph nodes in the porta hepatis region is difficult without IV contrast. SUV max in this region is approximately 5.9. Hypermetabolic lymph nodes are seen along the iliac chains and inguinal regions bilaterally. Proximal left common iliac lymph node measures 1.2 cm (CT image 175) with an SUV max of 4.0. Index left inguinal lymph node measures 1.3 cm (CT image 219) with an SUV max of 4.9. Liver, gallbladder, adrenal glands, kidneys, spleen, pancreas, stomach and bowel are otherwise grossly unremarkable. No free fluid. SKELETON No abnormal osseous hypermetabolism. IMPRESSION: 1. Hypermetabolic adenopathy in the neck, chest, abdomen and pelvis, consistent with the given history of lymphoma. 2. Oral/nasopharyngeal hypermetabolism can be seen in the setting of lymphoma. Please correlate clinically. 3. Hypermetabolism within subpleural consolidation in the left upper, left lower and right lower lobes may be infectious or inflammatory in etiology. Pulmonary lymphoma is not excluded Electronically Signed   By: Lorin Picket M.D.   On: 06/20/2015 11:19   Ct Biopsy  07/05/2015  CLINICAL DATA:  76 year old female with a history of mantle cell lymphoma. She has been referred for bone marrow biopsy. EXAM: CT-GUIDED BIOPSY BONE MARROW MEDICATIONS AND MEDICAL HISTORY: Versed 3.5 mg, Fentanyl 100 mcg. Additional Medications: None. The patient's level of consciousness and physiologic status monitored by Radiology Nursing. ANESTHESIA/SEDATION: Moderate sedation time: 14 minutes PROCEDURE: The procedure risks, benefits, and alternatives were explained to the patient. Questions regarding the procedure were encouraged and answered. The patient understands and consents to the procedure. Scout CT of the pelvis was performed for surgical planning purposes. The posterior pelvis was prepped with Betadinein a sterile fashion,  and a sterile drape was applied covering the operative field. A sterile gown and sterile gloves were used for the procedure. Local anesthesia was provided with 1% Lidocaine. We targeted the right posterior iliac bone for biopsy. The skin and subcutaneous tissues were infiltrated with 1% lidocaine without epinephrine. A small stab incision was made with an 11 blade scalpel, and an 11 gauge Murphy needle was advanced with CT guidance to the posterior cortex. Manual forced was used to advance the needle through the posterior cortex and the stylet was removed. A bone marrow aspirate was retrieved and passed to a cytotechnologist in the room. The Murphy needle was then advanced without the stylet for a core biopsy. The  core biopsy was retrieved and also passed to a cytotechnologist. Manual pressure was used for hemostasis and a sterile dressing was placed. No complications were encountered no significant blood loss was encountered. Patient tolerated the procedure well and remained hemodynamically stable throughout. FINDINGS: Scout image demonstrates safe approach to posterior iliac bone. Images during the case demonstrate placement of 11 gauge Murphy needle COMPLICATIONS: None IMPRESSION: Status post CT-guided bone marrow biopsy, with tissue specimen sent to pathology for complete histopathologic analysis Signed, Dulcy Fanny. Earleen Newport, DO [facility] Electronically Signed   By: Corrie Mckusick D.O.   On: 07/05/2015 11:18    ASSESSMENT & PLAN:   # MANTLE CELL LYMPHOMA-  Stage IV-  Proceed with bendamustine rituximab  Cycle #1 today.  I  Again discussed the potential reactions/ side effects of the treatments.   #  Tumor  Lysis prophylaxis with allopurinol. Recommended increased fluid intake.  #  Left tooth pain-  No evidence of any abscess.  Recommend Augmentin for 10 days.  #  Infectious prophylaxis-  Recommend acyclovir.  #  Antiemetic prophylaxis-   Prescription for  Compazine and Zofran to the pharmacy.  # Hx of RA-  Off methotrexate/  Reviewed the note from rheumatologist.   #  Patient will follow-up with me in 2 weeks/ labs for follow-up  #   Proceed with chemotherapy again in 4 weeks.  # 40 minutes face-to-face with the patient discussing the above plan of care; more than 50% of time spent on prognosis/ natural history; counseling and coordination.     Cammie Sickle, MD 07/12/2015 9:31 AM

## 2015-07-12 NOTE — Progress Notes (Signed)
Patient reports SOB on exertion when walking and talking. States when she brushes her teeth she has one tooth that when she presses her gum, "pus comes out". Patient also reports her "balance has been off lately". Denies any pain at this time.

## 2015-07-13 ENCOUNTER — Inpatient Hospital Stay: Payer: Medicare Other

## 2015-07-13 VITALS — BP 99/66 | HR 99 | Temp 97.7°F | Resp 20

## 2015-07-13 DIAGNOSIS — E785 Hyperlipidemia, unspecified: Secondary | ICD-10-CM | POA: Diagnosis not present

## 2015-07-13 DIAGNOSIS — Z5112 Encounter for antineoplastic immunotherapy: Secondary | ICD-10-CM | POA: Diagnosis not present

## 2015-07-13 DIAGNOSIS — K5900 Constipation, unspecified: Secondary | ICD-10-CM | POA: Diagnosis not present

## 2015-07-13 DIAGNOSIS — C8310 Mantle cell lymphoma, unspecified site: Secondary | ICD-10-CM | POA: Diagnosis not present

## 2015-07-13 DIAGNOSIS — M129 Arthropathy, unspecified: Secondary | ICD-10-CM | POA: Diagnosis not present

## 2015-07-13 DIAGNOSIS — M069 Rheumatoid arthritis, unspecified: Secondary | ICD-10-CM | POA: Diagnosis not present

## 2015-07-13 DIAGNOSIS — R634 Abnormal weight loss: Secondary | ICD-10-CM | POA: Diagnosis not present

## 2015-07-13 DIAGNOSIS — Z5111 Encounter for antineoplastic chemotherapy: Secondary | ICD-10-CM | POA: Diagnosis not present

## 2015-07-13 DIAGNOSIS — K219 Gastro-esophageal reflux disease without esophagitis: Secondary | ICD-10-CM | POA: Diagnosis not present

## 2015-07-13 DIAGNOSIS — Z7689 Persons encountering health services in other specified circumstances: Secondary | ICD-10-CM | POA: Diagnosis not present

## 2015-07-13 DIAGNOSIS — R0602 Shortness of breath: Secondary | ICD-10-CM | POA: Diagnosis not present

## 2015-07-13 DIAGNOSIS — C8313 Mantle cell lymphoma, intra-abdominal lymph nodes: Secondary | ICD-10-CM

## 2015-07-13 DIAGNOSIS — Z79899 Other long term (current) drug therapy: Secondary | ICD-10-CM | POA: Diagnosis not present

## 2015-07-13 LAB — HEPATITIS B CORE ANTIBODY, IGM: Hep B C IgM: NEGATIVE

## 2015-07-13 MED ORDER — SODIUM CHLORIDE 0.9 % IV SOLN
Freq: Once | INTRAVENOUS | Status: AC
  Administered 2015-07-13: 14:00:00 via INTRAVENOUS
  Filled 2015-07-13: qty 1000

## 2015-07-13 MED ORDER — SODIUM CHLORIDE 0.9 % IV SOLN
90.0000 mg/m2 | Freq: Once | INTRAVENOUS | Status: AC
  Administered 2015-07-13: 175 mg via INTRAVENOUS
  Filled 2015-07-13: qty 7

## 2015-07-13 MED ORDER — SODIUM CHLORIDE 0.9% FLUSH
10.0000 mL | INTRAVENOUS | Status: DC | PRN
  Administered 2015-07-13: 10 mL
  Filled 2015-07-13: qty 10

## 2015-07-13 MED ORDER — PEGFILGRASTIM 6 MG/0.6ML ~~LOC~~ PSKT
6.0000 mg | PREFILLED_SYRINGE | Freq: Once | SUBCUTANEOUS | Status: AC
  Administered 2015-07-13: 6 mg via SUBCUTANEOUS
  Filled 2015-07-13: qty 0.6

## 2015-07-13 MED ORDER — DEXAMETHASONE SODIUM PHOSPHATE 100 MG/10ML IJ SOLN
10.0000 mg | Freq: Once | INTRAMUSCULAR | Status: AC
  Administered 2015-07-13: 10 mg via INTRAVENOUS
  Filled 2015-07-13: qty 1

## 2015-07-13 MED ORDER — HEPARIN SOD (PORK) LOCK FLUSH 100 UNIT/ML IV SOLN
500.0000 [IU] | Freq: Once | INTRAVENOUS | Status: AC | PRN
  Administered 2015-07-13: 500 [IU]
  Filled 2015-07-13: qty 5

## 2015-07-26 ENCOUNTER — Encounter: Payer: Self-pay | Admitting: Internal Medicine

## 2015-07-26 ENCOUNTER — Inpatient Hospital Stay: Payer: Medicare Other

## 2015-07-26 DIAGNOSIS — K219 Gastro-esophageal reflux disease without esophagitis: Secondary | ICD-10-CM | POA: Diagnosis not present

## 2015-07-26 DIAGNOSIS — Z5112 Encounter for antineoplastic immunotherapy: Secondary | ICD-10-CM | POA: Diagnosis not present

## 2015-07-26 DIAGNOSIS — R634 Abnormal weight loss: Secondary | ICD-10-CM | POA: Diagnosis not present

## 2015-07-26 DIAGNOSIS — M069 Rheumatoid arthritis, unspecified: Secondary | ICD-10-CM | POA: Diagnosis not present

## 2015-07-26 DIAGNOSIS — R0602 Shortness of breath: Secondary | ICD-10-CM | POA: Diagnosis not present

## 2015-07-26 DIAGNOSIS — C8310 Mantle cell lymphoma, unspecified site: Secondary | ICD-10-CM | POA: Diagnosis not present

## 2015-07-26 DIAGNOSIS — M129 Arthropathy, unspecified: Secondary | ICD-10-CM | POA: Diagnosis not present

## 2015-07-26 DIAGNOSIS — Z5111 Encounter for antineoplastic chemotherapy: Secondary | ICD-10-CM | POA: Diagnosis not present

## 2015-07-26 DIAGNOSIS — K5900 Constipation, unspecified: Secondary | ICD-10-CM | POA: Diagnosis not present

## 2015-07-26 DIAGNOSIS — E785 Hyperlipidemia, unspecified: Secondary | ICD-10-CM | POA: Diagnosis not present

## 2015-07-26 DIAGNOSIS — Z7689 Persons encountering health services in other specified circumstances: Secondary | ICD-10-CM | POA: Diagnosis not present

## 2015-07-26 DIAGNOSIS — C8311 Mantle cell lymphoma, lymph nodes of head, face, and neck: Secondary | ICD-10-CM

## 2015-07-26 DIAGNOSIS — Z79899 Other long term (current) drug therapy: Secondary | ICD-10-CM | POA: Diagnosis not present

## 2015-07-26 LAB — COMPREHENSIVE METABOLIC PANEL
ALT: 14 U/L (ref 14–54)
AST: 20 U/L (ref 15–41)
Albumin: 3.6 g/dL (ref 3.5–5.0)
Alkaline Phosphatase: 81 U/L (ref 38–126)
Anion gap: 4 — ABNORMAL LOW (ref 5–15)
BUN: 19 mg/dL (ref 6–20)
CO2: 28 mmol/L (ref 22–32)
Calcium: 9.2 mg/dL (ref 8.9–10.3)
Chloride: 102 mmol/L (ref 101–111)
Creatinine, Ser: 0.68 mg/dL (ref 0.44–1.00)
GFR calc Af Amer: >60 mL/min (ref >60)
GFR calc non Af Amer: >60 mL/min (ref >60)
Glucose, Bld: 111 mg/dL — ABNORMAL HIGH (ref 65–99)
Potassium: 3.9 mmol/L (ref 3.5–5.1)
Sodium: 134 mmol/L — ABNORMAL LOW (ref 135–145)
Total Bilirubin: 0.6 mg/dL (ref 0.3–1.2)
Total Protein: 7.2 g/dL (ref 6.5–8.1)

## 2015-07-26 LAB — CBC WITH DIFFERENTIAL/PLATELET
Basophils Absolute: 0 10*3/uL (ref 0–0.1)
Basophils Relative: 0 %
Eosinophils Absolute: 0.1 10*3/uL (ref 0–0.7)
Eosinophils Relative: 2 %
HCT: 40 % (ref 35.0–47.0)
Hemoglobin: 13.1 g/dL (ref 12.0–16.0)
Lymphocytes Relative: 4 %
Lymphs Abs: 0.3 10*3/uL — ABNORMAL LOW (ref 1.0–3.6)
MCH: 29.2 pg (ref 26.0–34.0)
MCHC: 32.8 g/dL (ref 32.0–36.0)
MCV: 89 fL (ref 80.0–100.0)
Monocytes Absolute: 0.3 10*3/uL (ref 0.2–0.9)
Monocytes Relative: 5 %
Neutro Abs: 5.8 10*3/uL (ref 1.4–6.5)
Neutrophils Relative %: 89 %
Platelets: 140 10*3/uL — ABNORMAL LOW (ref 150–440)
RBC: 4.49 MIL/uL (ref 3.80–5.20)
RDW: 15.7 % — ABNORMAL HIGH (ref 11.5–14.5)
WBC: 6.5 10*3/uL (ref 3.6–11.0)

## 2015-08-09 ENCOUNTER — Inpatient Hospital Stay: Payer: Medicare Other | Attending: Internal Medicine

## 2015-08-09 ENCOUNTER — Other Ambulatory Visit: Payer: Self-pay | Admitting: *Deleted

## 2015-08-09 ENCOUNTER — Inpatient Hospital Stay: Payer: Medicare Other

## 2015-08-09 ENCOUNTER — Inpatient Hospital Stay (HOSPITAL_BASED_OUTPATIENT_CLINIC_OR_DEPARTMENT_OTHER): Payer: Medicare Other | Admitting: Internal Medicine

## 2015-08-09 ENCOUNTER — Encounter: Payer: Self-pay | Admitting: *Deleted

## 2015-08-09 VITALS — BP 115/74 | HR 69 | Temp 97.5°F | Ht 66.6 in | Wt 197.5 lb

## 2015-08-09 DIAGNOSIS — E785 Hyperlipidemia, unspecified: Secondary | ICD-10-CM

## 2015-08-09 DIAGNOSIS — M069 Rheumatoid arthritis, unspecified: Secondary | ICD-10-CM | POA: Insufficient documentation

## 2015-08-09 DIAGNOSIS — R51 Headache: Secondary | ICD-10-CM | POA: Insufficient documentation

## 2015-08-09 DIAGNOSIS — Z9221 Personal history of antineoplastic chemotherapy: Secondary | ICD-10-CM | POA: Diagnosis not present

## 2015-08-09 DIAGNOSIS — D709 Neutropenia, unspecified: Secondary | ICD-10-CM | POA: Insufficient documentation

## 2015-08-09 DIAGNOSIS — C8311 Mantle cell lymphoma, lymph nodes of head, face, and neck: Secondary | ICD-10-CM

## 2015-08-09 DIAGNOSIS — Z79899 Other long term (current) drug therapy: Secondary | ICD-10-CM

## 2015-08-09 DIAGNOSIS — C8310 Mantle cell lymphoma, unspecified site: Secondary | ICD-10-CM | POA: Diagnosis not present

## 2015-08-09 DIAGNOSIS — K219 Gastro-esophageal reflux disease without esophagitis: Secondary | ICD-10-CM | POA: Insufficient documentation

## 2015-08-09 LAB — CBC WITH DIFFERENTIAL/PLATELET
Basophils Absolute: 0 10*3/uL (ref 0–0.1)
Basophils Relative: 1 %
Eosinophils Absolute: 0.2 10*3/uL (ref 0–0.7)
Eosinophils Relative: 11 %
HCT: 36.5 % (ref 35.0–47.0)
Hemoglobin: 12.2 g/dL (ref 12.0–16.0)
Lymphocytes Relative: 18 %
Lymphs Abs: 0.3 10*3/uL — ABNORMAL LOW (ref 1.0–3.6)
MCH: 29.9 pg (ref 26.0–34.0)
MCHC: 33.4 g/dL (ref 32.0–36.0)
MCV: 89.5 fL (ref 80.0–100.0)
Monocytes Absolute: 0.3 10*3/uL (ref 0.2–0.9)
Monocytes Relative: 20 %
Neutro Abs: 0.8 10*3/uL — ABNORMAL LOW (ref 1.4–6.5)
Neutrophils Relative %: 50 %
Platelets: 159 10*3/uL (ref 150–440)
RBC: 4.08 MIL/uL (ref 3.80–5.20)
RDW: 16.7 % — ABNORMAL HIGH (ref 11.5–14.5)
WBC: 1.5 10*3/uL — ABNORMAL LOW (ref 3.6–11.0)

## 2015-08-09 LAB — COMPREHENSIVE METABOLIC PANEL
ALT: 14 U/L (ref 14–54)
AST: 25 U/L (ref 15–41)
Albumin: 3.6 g/dL (ref 3.5–5.0)
Alkaline Phosphatase: 84 U/L (ref 38–126)
Anion gap: 6 (ref 5–15)
BUN: 17 mg/dL (ref 6–20)
CO2: 25 mmol/L (ref 22–32)
Calcium: 8.9 mg/dL (ref 8.9–10.3)
Chloride: 104 mmol/L (ref 101–111)
Creatinine, Ser: 0.65 mg/dL (ref 0.44–1.00)
GFR calc Af Amer: >60 mL/min (ref >60)
GFR calc non Af Amer: >60 mL/min (ref >60)
Glucose, Bld: 95 mg/dL (ref 65–99)
Potassium: 3.9 mmol/L (ref 3.5–5.1)
Sodium: 135 mmol/L (ref 135–145)
Total Bilirubin: 0.7 mg/dL (ref 0.3–1.2)
Total Protein: 7.1 g/dL (ref 6.5–8.1)

## 2015-08-09 MED ORDER — ALLOPURINOL 300 MG PO TABS: 300.0000 mg | ORAL_TABLET | Freq: Two times a day (BID) | ORAL | Status: DC

## 2015-08-09 MED ORDER — SODIUM CHLORIDE 0.9% FLUSH
10.0000 mL | INTRAVENOUS | Status: DC | PRN
  Administered 2015-08-09: 10 mL via INTRAVENOUS
  Filled 2015-08-09: qty 10

## 2015-08-09 MED ORDER — HEPARIN SOD (PORK) LOCK FLUSH 100 UNIT/ML IV SOLN
500.0000 [IU] | Freq: Once | INTRAVENOUS | Status: AC
  Administered 2015-08-09: 500 [IU] via INTRAVENOUS
  Filled 2015-08-09: qty 5

## 2015-08-09 NOTE — Progress Notes (Signed)
[place] NOTE  Patient Care Team: Jackolyn Confer, MD as PCP - General (Internal Medicine) Jackolyn Confer, MD (Internal Medicine)  CHIEF COMPLAINTS/PURPOSE OF CONSULTATION: MANTLE CELL LYMPHOMA  # JAN 2017- MANTLE CELL LYMPHOMA STAGE IV; [R Breast LN [place] Core Bx-1.2cm LN/R Ax LN-Bx]; cyclin D Pos; Mitotic rate-LOW; MIPI score [5/intermediate risk]; BMBx-Positive for involvement. Feb 9th- START Benda-Ritux  # Rheumatoid Arthritis [on MXT]  HISTORY OF PRESENTING ILLNESS:  Michelle [name] 75 y.o.  female with with a  mantle cell lymphoma  Stage IV status post cycle #14 weeks ago.  In Gen. her appetite is improving. No nausea no vomiting. Also noted to have her lymph nodes improved. No fever no chills cough improving.  ROS: A complete 10 point review of system is done which is negative except mentioned above in history of present illness  MEDICAL HISTORY:  Past Medical History  Diagnosis Date  . Arthritis   . Headache(784.0)   . GERD (gastroesophageal reflux disease)   . Hyperlipidemia     hx  . Lymphoma, mantle cell ([place]) 06/01/2015    bx of lymph node in right breast  . Lymphadenopathy of head and neck 01/2015    see on Thyroid ultrasound  . Rheumatoid arthritis ([place])   . History of methotrexate therapy     SURGICAL HISTORY: Past Surgical History  Procedure Laterality Date  . Cardiac catheterization  09/2004    [facility]; EF 60%  . Cardiac catheterization  08/2004    [facility]  . Peripheral vascular catheterization N/A 07/04/2015    Procedure: [name] Insertion;  Surgeon: [name], MD;  Location: [place] [facility];  Service: Cardiovascular;  Laterality: N/A;    SOCIAL HISTORY: Social History   Social History  . Marital Status: Married    Spouse Name: N/A  . Number of Children: N/A  . Years of Education: N/A   Occupational History  . Not on file.   Social History Main Topics  . Smoking status: Never Smoker   . Smokeless tobacco: Never  Used  . Alcohol Use: No  . Drug Use: No  . Sexual Activity: Not on file   Other Topics Concern  . 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    FAMILY HISTORY: Family History  Problem Relation Age of Onset  . Diabetes Mother   . Cholelithiasis Mother   . Hypertension Sister   . Diabetes Sister   . Arthritis Brother     ALLERGIES:  has No Known Allergies.  MEDICATIONS:  Current Outpatient Prescriptions  Medication Sig Dispense Refill  . Calcium Carbonate-Vit D-Min (CALTRATE 600+D PLUS) 600-800 MG-UNIT CHEW Chew 1 tablet by mouth 2 (two) times daily. 60 tablet 6  . folic acid (FOLVITE) 1 MG tablet Take 1 mg by mouth. Reported on 07/12/2015    . lidocaine-prilocaine (EMLA) cream Apply 1 application topically as needed. 30 g 11  . Multiple Vitamins-Minerals (MULTIVITAMIN GUMMIES ADULT) CHEW Chew by mouth. Reported on 07/12/2015    . omeprazole (PRILOSEC) 40 MG capsule TAKE ONE CAPSULE EVERY DAY 30 capsule 5  . ondansetron (ZOFRAN) 8 MG tablet Take 1 tablet (8 mg total) by mouth every 8 (eight) hours as needed for nausea or vomiting (start 3 days; after chemo). 40 tablet 0  . traMADol (ULTRAM) 50 MG tablet Take 50 mg by mouth every morning.    [name]  vitamin E 400 UNIT capsule Take 400 Units by mouth daily.    [name] acyclovir (ZOVIRAX) 400 MG tablet Take 1 tablet (400 mg total) by mouth 2 (two) times daily. (Patient not taking: Reported on 07/12/2015) 120 tablet 2  . allopurinol (ZYLOPRIM) 300 MG tablet Take 1 tablet (300 mg total) by mouth 2 (two) times daily. (Patient not taking: Reported on 07/12/2015) 120 tablet 0  . hydroxychloroquine (PLAQUENIL) 200 MG tablet Take 400 mg by mouth daily. Reported on 07/12/2015  1  . meloxicam (MOBIC) 15 MG tablet TAKE 1 TABLET (15 MG TOTAL) BY MOUTH DAILY WITH MEAL (Patient not taking: Reported on 08/09/2015) 30 tablet 3  . NON FORMULARY Reported on 07/12/2015    . prochlorperazine  (COMPAZINE) 10 MG tablet Take 1 tablet (10 mg total) by mouth every 6 (six) hours as needed for nausea or vomiting. (Patient not taking: Reported on 07/12/2015) 30 tablet 0   No current facility-administered medications for this visit.   Facility-Administered Medications Ordered in Other Visits  Medication Dose Route Frequency Provider Last Rate Last Dose  . heparin lock flush 100 unit/mL  500 Units Intravenous Once Cammie Sickle, MD      . sodium chloride flush (NS) 0.9 % injection 10 mL  10 mL Intravenous PRN Cammie Sickle, MD   10 mL at 08/09/15 0921      .  PHYSICAL EXAMINATION: ECOG PERFORMANCE STATUS: 0 - Asymptomatic  Filed Vitals:   08/09/15 0934  BP: 115/74  Pulse: 69  Temp: 97.5 F (36.4 C)   Filed Weights   08/09/15 0934  Weight: 197 lb 8.5 oz (89.6 kg)    GENERAL: Well-nourished well-developed; Alert, no distress and comfortable. accompanied by her family.  EYES: no pallor or icterus OROPHARYNX: no thrush or ulceration; good dentition  NECK: supple, no masses felt LYMPH: Palpable lymphadenopathy in the cervical, axillary [2-3cm] ; inguinal regions LUNGS: clear to auscultation and No wheeze or crackles HEART/CVS: regular rate & rhythm and no murmurs; No lower extremity edema ABDOMEN: abdomen soft, non-tender and normal bowel sounds; positive for hepatomegaly.  Musculoskeletal:no cyanosis of digits and no clubbing;  PSYCH: alert & oriented x 3 with fluent speech NEURO: no focal motor/sensory deficits SKIN: no rashes or significant lesions;  Mediport accessed.  LABORATORY DATA:  I have reviewed the data as listed Lab Results  Component Value Date   WBC 1.5* 08/09/2015   HGB 12.2 08/09/2015   HCT 36.5 08/09/2015   MCV 89.5 08/09/2015   PLT 159 08/09/2015    Recent Labs  06/14/15 0942 07/12/15 0832 07/26/15 1107  NA 136 137 134*  K 3.3* 3.9 3.9  CL 105 103 102  CO2 27 27 28   GLUCOSE 101* 100* 111*  BUN 12 19 19   CREATININE  0.62 0.73 0.68  CALCIUM 8.9 8.8* 9.2  GFRNONAA >60 >60 >60  GFRAA >60 >60 >60  PROT 7.1 7.5 7.2  ALBUMIN 3.8 3.9 3.6  AST 25 22 20   ALT 16 12* 14  ALKPHOS 66 68 81  BILITOT 0.7 0.8 0.6     ASSESSMENT & PLAN:   # MANTLE CELL LYMPHOMA-  Stage IV-  Proceed with bendamustine rituximab  Cycle #1  4 weeks ago. She tolerated chemotherapy very well.  #Significant improvement of the lymphadenopathy noted; clinically she seems improved.[name] However today CBC shows absolute neutrophil count of 0.8 [patient did get her Neulasta]. We'll plan to delay chemotherapy by 1 week. She'll come back next week for the treatment/CBC.  Today CMP is pending.  # She'll follow-up with me in approximately 3 weeks from now/CBC BMP.   # Hx of RA-  Off methotrexate/  Reviewed the note from rheumatologist.    # 25  minutes face-to-face with the patient discussing the above plan of care; more than 50% of time spent on prognosis/ natural history; counseling and coordination.     Cammie Sickle, MD 08/09/2015 9:37 AM

## 2015-08-09 NOTE — Progress Notes (Signed)
Neut 0.8. No treatment today. Treatment will be delayed for one week.

## 2015-08-10 ENCOUNTER — Inpatient Hospital Stay: Payer: Medicare Other

## 2015-08-16 ENCOUNTER — Other Ambulatory Visit: Payer: Self-pay | Admitting: *Deleted

## 2015-08-16 ENCOUNTER — Inpatient Hospital Stay: Payer: Medicare Other

## 2015-08-16 ENCOUNTER — Other Ambulatory Visit: Payer: Self-pay | Admitting: Internal Medicine

## 2015-08-16 DIAGNOSIS — C8311 Mantle cell lymphoma, lymph nodes of head, face, and neck: Secondary | ICD-10-CM

## 2015-08-16 DIAGNOSIS — Z79899 Other long term (current) drug therapy: Secondary | ICD-10-CM | POA: Diagnosis not present

## 2015-08-16 DIAGNOSIS — Z9221 Personal history of antineoplastic chemotherapy: Secondary | ICD-10-CM | POA: Diagnosis not present

## 2015-08-16 DIAGNOSIS — K219 Gastro-esophageal reflux disease without esophagitis: Secondary | ICD-10-CM | POA: Diagnosis not present

## 2015-08-16 DIAGNOSIS — D709 Neutropenia, unspecified: Secondary | ICD-10-CM | POA: Diagnosis not present

## 2015-08-16 DIAGNOSIS — R51 Headache: Secondary | ICD-10-CM | POA: Diagnosis not present

## 2015-08-16 DIAGNOSIS — C8310 Mantle cell lymphoma, unspecified site: Secondary | ICD-10-CM | POA: Diagnosis not present

## 2015-08-16 DIAGNOSIS — M069 Rheumatoid arthritis, unspecified: Secondary | ICD-10-CM | POA: Diagnosis not present

## 2015-08-16 DIAGNOSIS — E785 Hyperlipidemia, unspecified: Secondary | ICD-10-CM | POA: Diagnosis not present

## 2015-08-16 LAB — CBC WITH DIFFERENTIAL/PLATELET
Basophils Absolute: 0 10*3/uL (ref 0–0.1)
Basophils Relative: 1 %
Eosinophils Absolute: 0.1 10*3/uL (ref 0–0.7)
Eosinophils Relative: 20 %
HCT: 37.3 % (ref 35.0–47.0)
Hemoglobin: 12.6 g/dL (ref 12.0–16.0)
Lymphocytes Relative: 41 %
Lymphs Abs: 0.3 10*3/uL — ABNORMAL LOW (ref 1.0–3.6)
MCH: 30.3 pg (ref 26.0–34.0)
MCHC: 33.8 g/dL (ref 32.0–36.0)
MCV: 89.7 fL (ref 80.0–100.0)
Monocytes Absolute: 0.3 10*3/uL (ref 0.2–0.9)
Monocytes Relative: 36 %
Neutro Abs: 0 10*3/uL — ABNORMAL LOW (ref 1.4–6.5)
Neutrophils Relative %: 2 %
Platelets: 174 10*3/uL (ref 150–440)
RBC: 4.16 MIL/uL (ref 3.80–5.20)
RDW: 16 % — ABNORMAL HIGH (ref 11.5–14.5)
WBC: 0.7 10*3/uL — CL (ref 3.6–11.0)

## 2015-08-16 MED ORDER — LEVOFLOXACIN 500 MG PO TABS: 500.0000 mg | ORAL_TABLET | Freq: Every day | ORAL | Status: DC

## 2015-08-16 MED ORDER — SODIUM CHLORIDE 0.9% FLUSH
10.0000 mL | INTRAVENOUS | Status: DC | PRN
  Administered 2015-08-16: 10 mL via INTRAVENOUS
  Filled 2015-08-16: qty 10

## 2015-08-16 MED ORDER — HEPARIN SOD (PORK) LOCK FLUSH 100 UNIT/ML IV SOLN
500.0000 [IU] | Freq: Once | INTRAVENOUS | Status: AC
  Administered 2015-08-16: 500 [IU] via INTRAVENOUS

## 2015-08-17 ENCOUNTER — Telehealth: Payer: Self-pay | Admitting: *Deleted

## 2015-08-17 ENCOUNTER — Inpatient Hospital Stay: Payer: Medicare Other

## 2015-08-17 ENCOUNTER — Encounter: Payer: Self-pay | Admitting: *Deleted

## 2015-08-17 ENCOUNTER — Other Ambulatory Visit: Payer: Self-pay | Admitting: Internal Medicine

## 2015-08-17 DIAGNOSIS — D702 Other drug-induced agranulocytosis: Secondary | ICD-10-CM

## 2015-08-17 DIAGNOSIS — Z9221 Personal history of antineoplastic chemotherapy: Secondary | ICD-10-CM | POA: Diagnosis not present

## 2015-08-17 DIAGNOSIS — E785 Hyperlipidemia, unspecified: Secondary | ICD-10-CM | POA: Diagnosis not present

## 2015-08-17 DIAGNOSIS — C8310 Mantle cell lymphoma, unspecified site: Secondary | ICD-10-CM | POA: Diagnosis not present

## 2015-08-17 DIAGNOSIS — D709 Neutropenia, unspecified: Secondary | ICD-10-CM | POA: Diagnosis not present

## 2015-08-17 DIAGNOSIS — Z79899 Other long term (current) drug therapy: Secondary | ICD-10-CM | POA: Diagnosis not present

## 2015-08-17 DIAGNOSIS — K219 Gastro-esophageal reflux disease without esophagitis: Secondary | ICD-10-CM | POA: Diagnosis not present

## 2015-08-17 DIAGNOSIS — R51 Headache: Secondary | ICD-10-CM | POA: Diagnosis not present

## 2015-08-17 DIAGNOSIS — M069 Rheumatoid arthritis, unspecified: Secondary | ICD-10-CM | POA: Diagnosis not present

## 2015-08-17 MED ORDER — TBO-FILGRASTIM 480 MCG/0.8ML ~~LOC~~ SOSY
480.0000 ug | PREFILLED_SYRINGE | Freq: Once | SUBCUTANEOUS | Status: AC
  Administered 2015-08-17: 480 ug via SUBCUTANEOUS
  Filled 2015-08-17: qty 0.8

## 2015-08-17 NOTE — Telephone Encounter (Signed)
Spoke with chemo charge nurse, Doristine Johns, RN, scheduling and prior auth team. Granix injection approved x 5 days. Pt instructed to come now for injection. Appointments provided for granix injection. Pt will come within the next hour for injection. RN explained purpose of injection. Instructed pt to stop Allopurinol as directed by MD. Bryan Lemma that that neutropenia may be related to allopurinol use per md. Teach back process performed.

## 2015-08-17 NOTE — Telephone Encounter (Signed)
-----   Message from Cammie Sickle, MD sent at 08/17/2015  8:11 AM EDT ----- Severe neutropenia- ? Etiology. [5 weeks post chemo] ? Related to allopurinol.  STOP Allopurinol. Start granix x 5 days.   Nazia Rhines, please inform pt of above; and check into granix/ scheduling especially given the week end; needs to start granix today.

## 2015-08-18 ENCOUNTER — Inpatient Hospital Stay: Payer: Medicare Other

## 2015-08-18 VITALS — BP 112/71 | HR 80 | Temp 97.7°F

## 2015-08-18 DIAGNOSIS — M069 Rheumatoid arthritis, unspecified: Secondary | ICD-10-CM | POA: Diagnosis not present

## 2015-08-18 DIAGNOSIS — D709 Neutropenia, unspecified: Secondary | ICD-10-CM | POA: Diagnosis not present

## 2015-08-18 DIAGNOSIS — C8310 Mantle cell lymphoma, unspecified site: Secondary | ICD-10-CM | POA: Diagnosis not present

## 2015-08-18 DIAGNOSIS — Z9221 Personal history of antineoplastic chemotherapy: Secondary | ICD-10-CM | POA: Diagnosis not present

## 2015-08-18 DIAGNOSIS — Z79899 Other long term (current) drug therapy: Secondary | ICD-10-CM | POA: Diagnosis not present

## 2015-08-18 DIAGNOSIS — D702 Other drug-induced agranulocytosis: Secondary | ICD-10-CM

## 2015-08-18 DIAGNOSIS — K219 Gastro-esophageal reflux disease without esophagitis: Secondary | ICD-10-CM | POA: Diagnosis not present

## 2015-08-18 DIAGNOSIS — R51 Headache: Secondary | ICD-10-CM | POA: Diagnosis not present

## 2015-08-18 DIAGNOSIS — E785 Hyperlipidemia, unspecified: Secondary | ICD-10-CM | POA: Diagnosis not present

## 2015-08-18 MED ORDER — TBO-FILGRASTIM 480 MCG/0.8ML ~~LOC~~ SOSY
480.0000 ug | PREFILLED_SYRINGE | Freq: Once | SUBCUTANEOUS | Status: AC
  Administered 2015-08-18: 480 ug via SUBCUTANEOUS

## 2015-08-19 ENCOUNTER — Inpatient Hospital Stay: Payer: Medicare Other

## 2015-08-19 VITALS — BP 116/71 | HR 80 | Temp 100.7°F | Resp 18

## 2015-08-19 DIAGNOSIS — M069 Rheumatoid arthritis, unspecified: Secondary | ICD-10-CM | POA: Diagnosis not present

## 2015-08-19 DIAGNOSIS — D702 Other drug-induced agranulocytosis: Secondary | ICD-10-CM

## 2015-08-19 DIAGNOSIS — E785 Hyperlipidemia, unspecified: Secondary | ICD-10-CM | POA: Diagnosis not present

## 2015-08-19 DIAGNOSIS — D709 Neutropenia, unspecified: Secondary | ICD-10-CM | POA: Diagnosis not present

## 2015-08-19 DIAGNOSIS — Z9221 Personal history of antineoplastic chemotherapy: Secondary | ICD-10-CM | POA: Diagnosis not present

## 2015-08-19 DIAGNOSIS — K219 Gastro-esophageal reflux disease without esophagitis: Secondary | ICD-10-CM | POA: Diagnosis not present

## 2015-08-19 DIAGNOSIS — C8310 Mantle cell lymphoma, unspecified site: Secondary | ICD-10-CM | POA: Diagnosis not present

## 2015-08-19 DIAGNOSIS — Z79899 Other long term (current) drug therapy: Secondary | ICD-10-CM | POA: Diagnosis not present

## 2015-08-19 DIAGNOSIS — R51 Headache: Secondary | ICD-10-CM | POA: Diagnosis not present

## 2015-08-19 MED ORDER — TBO-FILGRASTIM 480 MCG/0.8ML ~~LOC~~ SOSY
480.0000 ug | PREFILLED_SYRINGE | Freq: Once | SUBCUTANEOUS | Status: AC
  Administered 2015-08-19: 480 ug via SUBCUTANEOUS

## 2015-08-20 ENCOUNTER — Inpatient Hospital Stay: Payer: Medicare Other

## 2015-08-20 ENCOUNTER — Other Ambulatory Visit: Payer: Self-pay | Admitting: Internal Medicine

## 2015-08-20 ENCOUNTER — Encounter: Payer: Self-pay | Admitting: *Deleted

## 2015-08-20 VITALS — BP 115/74 | HR 80 | Temp 97.5°F | Resp 20

## 2015-08-20 DIAGNOSIS — K219 Gastro-esophageal reflux disease without esophagitis: Secondary | ICD-10-CM | POA: Diagnosis not present

## 2015-08-20 DIAGNOSIS — Z9221 Personal history of antineoplastic chemotherapy: Secondary | ICD-10-CM | POA: Diagnosis not present

## 2015-08-20 DIAGNOSIS — Z79899 Other long term (current) drug therapy: Secondary | ICD-10-CM | POA: Diagnosis not present

## 2015-08-20 DIAGNOSIS — D702 Other drug-induced agranulocytosis: Secondary | ICD-10-CM

## 2015-08-20 DIAGNOSIS — M069 Rheumatoid arthritis, unspecified: Secondary | ICD-10-CM | POA: Diagnosis not present

## 2015-08-20 DIAGNOSIS — R51 Headache: Secondary | ICD-10-CM | POA: Diagnosis not present

## 2015-08-20 DIAGNOSIS — E785 Hyperlipidemia, unspecified: Secondary | ICD-10-CM | POA: Diagnosis not present

## 2015-08-20 DIAGNOSIS — D709 Neutropenia, unspecified: Secondary | ICD-10-CM | POA: Diagnosis not present

## 2015-08-20 DIAGNOSIS — C8310 Mantle cell lymphoma, unspecified site: Secondary | ICD-10-CM | POA: Diagnosis not present

## 2015-08-20 DIAGNOSIS — C8311 Mantle cell lymphoma, lymph nodes of head, face, and neck: Secondary | ICD-10-CM

## 2015-08-20 LAB — BASIC METABOLIC PANEL
Anion gap: 5 (ref 5–15)
BUN: 16 mg/dL (ref 6–20)
CO2: 27 mmol/L (ref 22–32)
Calcium: 8.2 mg/dL — ABNORMAL LOW (ref 8.9–10.3)
Chloride: 101 mmol/L (ref 101–111)
Creatinine, Ser: 0.72 mg/dL (ref 0.44–1.00)
GFR calc Af Amer: >60 mL/min (ref >60)
GFR calc non Af Amer: >60 mL/min (ref >60)
Glucose, Bld: 102 mg/dL — ABNORMAL HIGH (ref 65–99)
Potassium: 3.3 mmol/L — ABNORMAL LOW (ref 3.5–5.1)
Sodium: 133 mmol/L — ABNORMAL LOW (ref 135–145)

## 2015-08-20 LAB — CBC WITH DIFFERENTIAL/PLATELET
Basophils Absolute: 0 10*3/uL (ref 0–0.1)
Basophils Relative: 1 %
Eosinophils Absolute: 0.2 10*3/uL (ref 0–0.7)
Eosinophils Relative: 14 %
HCT: 38 % (ref 35.0–47.0)
Hemoglobin: 12.6 g/dL (ref 12.0–16.0)
Lymphocytes Relative: 30 %
Lymphs Abs: 0.4 10*3/uL — ABNORMAL LOW (ref 1.0–3.6)
MCH: 29.7 pg (ref 26.0–34.0)
MCHC: 33.1 g/dL (ref 32.0–36.0)
MCV: 89.7 fL (ref 80.0–100.0)
Monocytes Absolute: 0.7 10*3/uL (ref 0.2–0.9)
Monocytes Relative: 53 %
Neutro Abs: 0 10*3/uL — ABNORMAL LOW (ref 1.4–6.5)
Neutrophils Relative %: 2 %
Platelets: 194 10*3/uL (ref 150–440)
RBC: 4.23 MIL/uL (ref 3.80–5.20)
RDW: 15.6 % — ABNORMAL HIGH (ref 11.5–14.5)
WBC: 1.4 10*3/uL — CL (ref 3.6–11.0)

## 2015-08-20 MED ORDER — TBO-FILGRASTIM 480 MCG/0.8ML ~~LOC~~ SOSY
480.0000 ug | PREFILLED_SYRINGE | Freq: Once | SUBCUTANEOUS | Status: AC
  Administered 2015-08-20: 480 ug via SUBCUTANEOUS
  Filled 2015-08-20: qty 0.8

## 2015-08-20 NOTE — Progress Notes (Signed)
Call report from [place] in lab at 1135AM today. Critical value reported. ANC 0.0 and wbc count 1.4 read back process performed with Lanelle Bal and Dr. Rogue Bussing (at 1145 am). New md order provided to administer granix injection on Wed (3/22) and Thursday (3/23) this week.  Most likely, md will need to hold tx on Thursday. Pt in clinic now and made aware of the plan and results.  New order sent to [facility]tr scheduling to schedule granix as per md order.

## 2015-08-21 ENCOUNTER — Other Ambulatory Visit: Payer: Self-pay | Admitting: *Deleted

## 2015-08-21 ENCOUNTER — Inpatient Hospital Stay: Payer: Medicare Other

## 2015-08-21 VITALS — BP 112/67 | HR 66 | Temp 97.5°F | Resp 18

## 2015-08-21 DIAGNOSIS — Z79899 Other long term (current) drug therapy: Secondary | ICD-10-CM | POA: Diagnosis not present

## 2015-08-21 DIAGNOSIS — D709 Neutropenia, unspecified: Secondary | ICD-10-CM | POA: Diagnosis not present

## 2015-08-21 DIAGNOSIS — Z9221 Personal history of antineoplastic chemotherapy: Secondary | ICD-10-CM | POA: Diagnosis not present

## 2015-08-21 DIAGNOSIS — E785 Hyperlipidemia, unspecified: Secondary | ICD-10-CM | POA: Diagnosis not present

## 2015-08-21 DIAGNOSIS — K219 Gastro-esophageal reflux disease without esophagitis: Secondary | ICD-10-CM | POA: Diagnosis not present

## 2015-08-21 DIAGNOSIS — R51 Headache: Secondary | ICD-10-CM | POA: Diagnosis not present

## 2015-08-21 DIAGNOSIS — D702 Other drug-induced agranulocytosis: Secondary | ICD-10-CM

## 2015-08-21 DIAGNOSIS — M069 Rheumatoid arthritis, unspecified: Secondary | ICD-10-CM | POA: Diagnosis not present

## 2015-08-21 DIAGNOSIS — C8310 Mantle cell lymphoma, unspecified site: Secondary | ICD-10-CM | POA: Diagnosis not present

## 2015-08-21 MED ORDER — TBO-FILGRASTIM 480 MCG/0.8ML ~~LOC~~ SOSY
480.0000 ug | PREFILLED_SYRINGE | Freq: Once | SUBCUTANEOUS | Status: AC
  Administered 2015-08-21: 480 ug via SUBCUTANEOUS
  Filled 2015-08-21: qty 0.8

## 2015-08-22 ENCOUNTER — Inpatient Hospital Stay: Payer: Medicare Other

## 2015-08-22 ENCOUNTER — Other Ambulatory Visit: Payer: Self-pay | Admitting: Internal Medicine

## 2015-08-22 VITALS — BP 120/77 | HR 75 | Temp 96.1°F | Resp 20

## 2015-08-22 DIAGNOSIS — E785 Hyperlipidemia, unspecified: Secondary | ICD-10-CM | POA: Diagnosis not present

## 2015-08-22 DIAGNOSIS — Z79899 Other long term (current) drug therapy: Secondary | ICD-10-CM | POA: Diagnosis not present

## 2015-08-22 DIAGNOSIS — Z9221 Personal history of antineoplastic chemotherapy: Secondary | ICD-10-CM | POA: Diagnosis not present

## 2015-08-22 DIAGNOSIS — K219 Gastro-esophageal reflux disease without esophagitis: Secondary | ICD-10-CM | POA: Diagnosis not present

## 2015-08-22 DIAGNOSIS — D709 Neutropenia, unspecified: Secondary | ICD-10-CM | POA: Diagnosis not present

## 2015-08-22 DIAGNOSIS — C8310 Mantle cell lymphoma, unspecified site: Secondary | ICD-10-CM | POA: Diagnosis not present

## 2015-08-22 DIAGNOSIS — R51 Headache: Secondary | ICD-10-CM | POA: Diagnosis not present

## 2015-08-22 DIAGNOSIS — D702 Other drug-induced agranulocytosis: Secondary | ICD-10-CM

## 2015-08-22 DIAGNOSIS — M069 Rheumatoid arthritis, unspecified: Secondary | ICD-10-CM | POA: Diagnosis not present

## 2015-08-22 MED ORDER — TBO-FILGRASTIM 480 MCG/0.8ML ~~LOC~~ SOSY
480.0000 ug | PREFILLED_SYRINGE | Freq: Once | SUBCUTANEOUS | Status: AC
  Administered 2015-08-22: 480 ug via SUBCUTANEOUS
  Filled 2015-08-22: qty 0.8

## 2015-08-23 ENCOUNTER — Inpatient Hospital Stay: Payer: Medicare Other

## 2015-08-23 ENCOUNTER — Inpatient Hospital Stay (HOSPITAL_BASED_OUTPATIENT_CLINIC_OR_DEPARTMENT_OTHER): Payer: Medicare Other | Admitting: Internal Medicine

## 2015-08-23 ENCOUNTER — Other Ambulatory Visit: Payer: Self-pay

## 2015-08-23 ENCOUNTER — Telehealth: Payer: Self-pay | Admitting: *Deleted

## 2015-08-23 ENCOUNTER — Other Ambulatory Visit: Payer: Self-pay | Admitting: *Deleted

## 2015-08-23 ENCOUNTER — Ambulatory Visit: Payer: Self-pay | Admitting: Internal Medicine

## 2015-08-23 VITALS — BP 110/68 | HR 74 | Temp 97.0°F | Resp 18 | Ht 66.6 in | Wt 214.7 lb

## 2015-08-23 DIAGNOSIS — Z79899 Other long term (current) drug therapy: Secondary | ICD-10-CM

## 2015-08-23 DIAGNOSIS — E785 Hyperlipidemia, unspecified: Secondary | ICD-10-CM | POA: Diagnosis not present

## 2015-08-23 DIAGNOSIS — M069 Rheumatoid arthritis, unspecified: Secondary | ICD-10-CM | POA: Diagnosis not present

## 2015-08-23 DIAGNOSIS — D709 Neutropenia, unspecified: Secondary | ICD-10-CM

## 2015-08-23 DIAGNOSIS — Z9221 Personal history of antineoplastic chemotherapy: Secondary | ICD-10-CM

## 2015-08-23 DIAGNOSIS — K219 Gastro-esophageal reflux disease without esophagitis: Secondary | ICD-10-CM | POA: Diagnosis not present

## 2015-08-23 DIAGNOSIS — C8311 Mantle cell lymphoma, lymph nodes of head, face, and neck: Secondary | ICD-10-CM

## 2015-08-23 DIAGNOSIS — R51 Headache: Secondary | ICD-10-CM | POA: Diagnosis not present

## 2015-08-23 DIAGNOSIS — D702 Other drug-induced agranulocytosis: Secondary | ICD-10-CM

## 2015-08-23 DIAGNOSIS — C8310 Mantle cell lymphoma, unspecified site: Secondary | ICD-10-CM

## 2015-08-23 LAB — CBC WITH DIFFERENTIAL/PLATELET
Basophils Absolute: 0 10*3/uL (ref 0–0.1)
Basophils Relative: 0 %
Eosinophils Absolute: 0.3 10*3/uL (ref 0–0.7)
Eosinophils Relative: 3 %
HCT: 38.4 % (ref 35.0–47.0)
Hemoglobin: 12.8 g/dL (ref 12.0–16.0)
Lymphocytes Relative: 6 %
Lymphs Abs: 0.6 10*3/uL — ABNORMAL LOW (ref 1.0–3.6)
MCH: 29.9 pg (ref 26.0–34.0)
MCHC: 33.2 g/dL (ref 32.0–36.0)
MCV: 90 fL (ref 80.0–100.0)
Monocytes Absolute: 1.7 10*3/uL — ABNORMAL HIGH (ref 0.2–0.9)
Monocytes Relative: 17 %
Neutro Abs: 7 10*3/uL — ABNORMAL HIGH (ref 1.4–6.5)
Neutrophils Relative %: 74 %
Platelets: 206 10*3/uL (ref 150–440)
RBC: 4.26 MIL/uL (ref 3.80–5.20)
RDW: 15.7 % — ABNORMAL HIGH (ref 11.5–14.5)
WBC: 9.6 10*3/uL (ref 3.6–11.0)

## 2015-08-23 MED ORDER — TBO-FILGRASTIM 480 MCG/0.8ML ~~LOC~~ SOSY
480.0000 ug | PREFILLED_SYRINGE | Freq: Once | SUBCUTANEOUS | Status: AC
  Administered 2015-08-23: 480 ug via SUBCUTANEOUS
  Filled 2015-08-23: qty 0.8

## 2015-08-23 NOTE — Progress Notes (Signed)
No treatment today due to neutropenia

## 2015-08-23 NOTE — Progress Notes (Signed)
Per Dr. Sarina Ill granix injection prior to lab results coming back today.

## 2015-08-23 NOTE — Progress Notes (Signed)
Eating an drinking good, some constipation since she has not been on herbal life.  She had fever on Sunday with swollen mouth from an infected tooth temp 101 and took tylenol and it went away

## 2015-08-23 NOTE — Telephone Encounter (Signed)
-----   Message from Cammie Sickle, MD sent at 08/23/2015 12:17 PM EDT ----- Please inform patient that her labs have improved. We will stop Granix. Recheck CBC Tuesday Thursday next week; and that following week Tuesday/patient sees me April 6th Thursday with labs/chemo. [i will decide on type of chemo R-Benda vs R-chop in the next few days based on her labs]. Thx

## 2015-08-23 NOTE — Progress Notes (Signed)
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 Feb 9th- START Benda-Ritux  # Rheumatoid Arthritis [on MXT]  HISTORY OF PRESENTING ILLNESS:  Michelle [name] 75 y.o.  female with with a  mantle cell lymphoma  Stage IV status post cycle #1 appx 6 weeks ago.  Patient did not receive cycle #2 as planned 2 weeks ago because of severe neutropenia. She has been on Granix for the last 6 days- with no significant improvement on her white count from 2 days ago.  She had episode of fever with tooth pain- 7 days ago; resolved with Tylenol. She is on Levaquin.   In Gen. her appetite is improving. No nausea no vomiting. Also noted to have her lymph nodes improved. No chills cough improving.  ROS: A complete 10 point review of system is done which is negative except mentioned above in history of present illness  MEDICAL HISTORY:  Past Medical History  Diagnosis Date  . Arthritis   . Headache(784.0)   . GERD (gastroesophageal reflux disease)   . Hyperlipidemia     hx  . Lymphoma, mantle cell ([place]) 06/01/2015    bx of lymph node in right breast  . Lymphadenopathy of head and neck 01/2015    see on Thyroid ultrasound  . Rheumatoid arthritis ([place])   . History of methotrexate therapy     SURGICAL HISTORY: Past Surgical History  Procedure Laterality Date  . Cardiac catheterization  09/2004    [facility]; EF 60%  . Cardiac catheterization  08/2004    [facility]  . Peripheral vascular catheterization N/A 07/04/2015    Procedure: [name] Insertion;  Surgeon: [name], MD;  Location: [place] [facility];  Service: Cardiovascular;  Laterality:  N/A;    SOCIAL HISTORY: Social History   Social History  . Marital Status: Married    Spouse Name: N/A  . Number of Children: N/A  . Years of Education: N/A   Occupational History  . Not on file.   Social History Main Topics  . Smoking status: Never Smoker   . Smokeless tobacco: Never Used  . Alcohol Use: No  . Drug Use: No  . Sexual Activity: Not on file   Other Topics Concern  . 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    FAMILY HISTORY: Family History  Problem Relation Age of Onset  . Diabetes Mother   . Cholelithiasis Mother   . Hypertension Sister   . Diabetes Sister   . Arthritis Brother     ALLERGIES:  has No Known Allergies.  MEDICATIONS:  Current Outpatient Prescriptions  Medication Sig Dispense Refill  . Calcium Carbonate-Vit D-Min (CALTRATE 600+D PLUS) 600-800 MG-UNIT CHEW Chew 1 tablet by mouth 2 (two) times daily. 60 tablet 6  . levofloxacin (LEVAQUIN) 500 MG tablet Take 1 tablet (500 mg total) by mouth daily. 10 tablet 0  . lidocaine-prilocaine (EMLA) cream Apply 1 application topically as needed. 30 g 11  . Multiple Vitamins-Minerals (MULTIVITAMIN GUMMIES ADULT) CHEW Chew by mouth. Reported on 07/12/2015    . omeprazole (  PRILOSEC) 40 MG capsule TAKE ONE CAPSULE EVERY DAY 30 capsule 5  . ondansetron (ZOFRAN) 8 MG tablet Take 1 tablet (8 mg total) by mouth every 8 (eight) hours as needed for nausea or vomiting (start 3 days; after chemo). 40 tablet 0  . prochlorperazine (COMPAZINE) 10 MG tablet Take 1 tablet (10 mg total) by mouth every 6 (six) hours as needed for nausea or vomiting. 30 tablet 0  . traMADol (ULTRAM) 50 MG tablet Take 50 mg by mouth every morning.    . vitamin E 400 UNIT capsule Take 400 Units by mouth daily.    . NON FORMULARY Reported on 08/23/2015     No current facility-administered medications for this visit.      [name]  PHYSICAL  EXAMINATION: ECOG PERFORMANCE STATUS: 0 - Asymptomatic  Filed Vitals:   08/23/15 0940  BP: 110/68  Pulse: 74  Temp: 97 F (36.1 C)  Resp: 18   Filed Weights   08/23/15 0940  Weight: 214 lb 11.7 oz (97.4 kg)    GENERAL: Well-nourished well-developed; Alert, no distress and comfortable. accompanied by her family.  EYES: no pallor or icterus OROPHARYNX: no thrush or ulceration; good dentition  NECK: supple, no masses felt LYMPH: Palpable lymphadenopathy in the cervical, axillary [1-cm/improved] LUNGS: clear to auscultation and No wheeze or crackles HEART/CVS: regular rate & rhythm and no murmurs; No lower extremity edema ABDOMEN: abdomen soft, non-tender and normal bowel sounds; positive for hepatomegaly.  Musculoskeletal:no cyanosis of digits and no clubbing;  PSYCH: alert & oriented x 3 with fluent speech NEURO: no focal motor/sensory deficits SKIN: no rashes or significant lesions  LABORATORY DATA:  I have reviewed the data as listed Lab Results  Component Value Date   WBC 1.4* 08/20/2015   HGB 12.6 08/20/2015   HCT 38.0 08/20/2015   MCV 89.7 08/20/2015   PLT 194 08/20/2015    Recent Labs  07/12/15 0832 07/26/15 1107 08/09/15 0911 08/20/15 1115  NA 137 134* 135 133*  K 3.9 3.9 3.9 3.3*  CL 103 102 104 101  CO2 27 28 25 27   GLUCOSE 100* 111* 95 102*  BUN 19 19 17 16   CREATININE 0.73 0.68 0.65 0.72  CALCIUM 8.8* 9.2 8.9 8.2*  GFRNONAA >60 >60 >60 >60  GFRAA >60 >60 >60 >60  PROT 7.5 7.2 7.1  --   ALBUMIN 3.9 3.6 3.6  --   AST 22 20 25   --   ALT 12* 14 14  --   ALKPHOS 68 81 84  --   BILITOT 0.8 0.6 0.7  --      ASSESSMENT & PLAN:   # MANTLE CELL LYMPHOMA-  Stage IV-  Proceed with bendamustine rituximab  Cycle #1 appx  6  weeks ago. She tolerated chemotherapy very well except for prolonged neutropenia [C discussion below]. Patient had a good response to chemotherapy just after 1 cycle-with the improvement of the lymph nodes.  We'll check a MUGA  scan- in anticipation of R CHOP chemotherapy; if her blood counts not improve.    # Severe neutropenia- ANC 0 at week 4 post chemotherapy. On Granix for the last 6 days- 3 days ago [place] still 0/white count 1.4. Normal platelets and hemoglobin. Question related to bendamustine versus other causes. Off allopurinol.  # Fever with tooth pain /currently resolved. - Continue Levaquin.   #  port flush again in 2 weeks.   # The interim follow-up- to be based on blood counts today.  # Otherwise patient  follow-up with me in 2 weeks/CBC CMP/chemotherapy bendamustine rituximab versus R CHOP.   # 25  minutes face-to-face with the patient discussing the above plan of care; more than 50% of time spent on prognosis/ natural history; counseling and coordination.     Cammie Sickle, MD 08/23/2015 10:02 AM

## 2015-08-23 NOTE — Telephone Encounter (Signed)
msg sent to scheduling to arrange for lab apts per md order. I spoke with the patient. She is aware that she needs to come on 3/28, 3/30 and 4/4 for lab only. I explained to her that she does not need any more granix at this time. She understands that Dr. Rogue Bussing will wait on her muga scan results to determine which chemotherapy that she will need in 2 weeks time.  Teach back process performed with patient.

## 2015-08-24 ENCOUNTER — Inpatient Hospital Stay: Payer: Medicare Other

## 2015-08-28 ENCOUNTER — Inpatient Hospital Stay: Payer: Medicare Other

## 2015-08-28 DIAGNOSIS — Z79899 Other long term (current) drug therapy: Secondary | ICD-10-CM | POA: Diagnosis not present

## 2015-08-28 DIAGNOSIS — C8310 Mantle cell lymphoma, unspecified site: Secondary | ICD-10-CM | POA: Diagnosis not present

## 2015-08-28 DIAGNOSIS — Z9221 Personal history of antineoplastic chemotherapy: Secondary | ICD-10-CM | POA: Diagnosis not present

## 2015-08-28 DIAGNOSIS — R51 Headache: Secondary | ICD-10-CM | POA: Diagnosis not present

## 2015-08-28 DIAGNOSIS — K219 Gastro-esophageal reflux disease without esophagitis: Secondary | ICD-10-CM | POA: Diagnosis not present

## 2015-08-28 DIAGNOSIS — E785 Hyperlipidemia, unspecified: Secondary | ICD-10-CM | POA: Diagnosis not present

## 2015-08-28 DIAGNOSIS — C8311 Mantle cell lymphoma, lymph nodes of head, face, and neck: Secondary | ICD-10-CM

## 2015-08-28 DIAGNOSIS — M069 Rheumatoid arthritis, unspecified: Secondary | ICD-10-CM | POA: Diagnosis not present

## 2015-08-28 DIAGNOSIS — D709 Neutropenia, unspecified: Secondary | ICD-10-CM | POA: Diagnosis not present

## 2015-08-28 LAB — CBC WITH DIFFERENTIAL/PLATELET
Basophils Absolute: 0 10*3/uL (ref 0–0.1)
Basophils Relative: 1 %
Eosinophils Absolute: 0.1 10*3/uL (ref 0–0.7)
Eosinophils Relative: 4 %
HCT: 35.6 % (ref 35.0–47.0)
Hemoglobin: 11.6 g/dL — ABNORMAL LOW (ref 12.0–16.0)
Lymphocytes Relative: 15 %
Lymphs Abs: 0.4 10*3/uL — ABNORMAL LOW (ref 1.0–3.6)
MCH: 28.8 pg (ref 26.0–34.0)
MCHC: 32.5 g/dL (ref 32.0–36.0)
MCV: 88.7 fL (ref 80.0–100.0)
Monocytes Absolute: 0.3 10*3/uL (ref 0.2–0.9)
Monocytes Relative: 13 %
Neutro Abs: 1.8 10*3/uL (ref 1.4–6.5)
Neutrophils Relative %: 67 %
Platelets: 206 10*3/uL (ref 150–440)
RBC: 4.02 MIL/uL (ref 3.80–5.20)
RDW: 16 % — ABNORMAL HIGH (ref 11.5–14.5)
WBC: 2.6 10*3/uL — ABNORMAL LOW (ref 3.6–11.0)

## 2015-08-30 ENCOUNTER — Inpatient Hospital Stay: Payer: Medicare Other

## 2015-08-30 ENCOUNTER — Ambulatory Visit
Admission: RE | Admit: 2015-08-30 | Discharge: 2015-08-30 | Disposition: A | Discharge status: Home / Self Care | Payer: Medicare Other | Source: Ambulatory Visit | Attending: Internal Medicine | Admitting: Internal Medicine

## 2015-08-30 DIAGNOSIS — R51 Headache: Secondary | ICD-10-CM | POA: Diagnosis not present

## 2015-08-30 DIAGNOSIS — C8311 Mantle cell lymphoma, lymph nodes of head, face, and neck: Secondary | ICD-10-CM

## 2015-08-30 DIAGNOSIS — C50919 Malignant neoplasm of unspecified site of unspecified female breast: Secondary | ICD-10-CM | POA: Diagnosis not present

## 2015-08-30 DIAGNOSIS — Z79899 Other long term (current) drug therapy: Secondary | ICD-10-CM | POA: Diagnosis not present

## 2015-08-30 DIAGNOSIS — Z9221 Personal history of antineoplastic chemotherapy: Secondary | ICD-10-CM | POA: Insufficient documentation

## 2015-08-30 DIAGNOSIS — K219 Gastro-esophageal reflux disease without esophagitis: Secondary | ICD-10-CM | POA: Diagnosis not present

## 2015-08-30 DIAGNOSIS — C8310 Mantle cell lymphoma, unspecified site: Secondary | ICD-10-CM | POA: Diagnosis not present

## 2015-08-30 DIAGNOSIS — C8313 Mantle cell lymphoma, intra-abdominal lymph nodes: Secondary | ICD-10-CM | POA: Diagnosis not present

## 2015-08-30 DIAGNOSIS — D709 Neutropenia, unspecified: Secondary | ICD-10-CM | POA: Diagnosis not present

## 2015-08-30 DIAGNOSIS — M069 Rheumatoid arthritis, unspecified: Secondary | ICD-10-CM | POA: Diagnosis not present

## 2015-08-30 DIAGNOSIS — T451X5A Adverse effect of antineoplastic and immunosuppressive drugs, initial encounter: Secondary | ICD-10-CM | POA: Diagnosis not present

## 2015-08-30 DIAGNOSIS — E785 Hyperlipidemia, unspecified: Secondary | ICD-10-CM | POA: Diagnosis not present

## 2015-08-30 LAB — CBC WITH DIFFERENTIAL/PLATELET
Basophils Absolute: 0 10*3/uL (ref 0–0.1)
Basophils Relative: 1 %
Eosinophils Absolute: 0.1 10*3/uL (ref 0–0.7)
Eosinophils Relative: 4 %
HCT: 38.1 % (ref 35.0–47.0)
Hemoglobin: 12.8 g/dL (ref 12.0–16.0)
Lymphocytes Relative: 18 %
Lymphs Abs: 0.6 10*3/uL — ABNORMAL LOW (ref 1.0–3.6)
MCH: 29.9 pg (ref 26.0–34.0)
MCHC: 33.7 g/dL (ref 32.0–36.0)
MCV: 88.7 fL (ref 80.0–100.0)
Monocytes Absolute: 0.5 10*3/uL (ref 0.2–0.9)
Monocytes Relative: 15 %
Neutro Abs: 2.1 10*3/uL (ref 1.4–6.5)
Neutrophils Relative %: 62 %
Platelets: 236 10*3/uL (ref 150–440)
RBC: 4.29 MIL/uL (ref 3.80–5.20)
RDW: 16.4 % — ABNORMAL HIGH (ref 11.5–14.5)
WBC: 3.4 10*3/uL — ABNORMAL LOW (ref 3.6–11.0)

## 2015-08-30 MED ORDER — TECHNETIUM TC 99M-LABELED RED BLOOD CELLS IV KIT
21.2360 | PACK | Freq: Once | INTRAVENOUS | Status: AC | PRN
  Administered 2015-08-30: 21.236 via INTRAVENOUS

## 2015-09-04 ENCOUNTER — Inpatient Hospital Stay: Payer: Medicare Other | Attending: Internal Medicine

## 2015-09-04 DIAGNOSIS — Z5111 Encounter for antineoplastic chemotherapy: Secondary | ICD-10-CM | POA: Insufficient documentation

## 2015-09-04 DIAGNOSIS — M069 Rheumatoid arthritis, unspecified: Secondary | ICD-10-CM | POA: Insufficient documentation

## 2015-09-04 DIAGNOSIS — M129 Arthropathy, unspecified: Secondary | ICD-10-CM | POA: Insufficient documentation

## 2015-09-04 DIAGNOSIS — D709 Neutropenia, unspecified: Secondary | ICD-10-CM | POA: Diagnosis not present

## 2015-09-04 DIAGNOSIS — K219 Gastro-esophageal reflux disease without esophagitis: Secondary | ICD-10-CM | POA: Diagnosis not present

## 2015-09-04 DIAGNOSIS — Z7689 Persons encountering health services in other specified circumstances: Secondary | ICD-10-CM | POA: Insufficient documentation

## 2015-09-04 DIAGNOSIS — C8318 Mantle cell lymphoma, lymph nodes of multiple sites: Secondary | ICD-10-CM | POA: Insufficient documentation

## 2015-09-04 DIAGNOSIS — E785 Hyperlipidemia, unspecified: Secondary | ICD-10-CM | POA: Insufficient documentation

## 2015-09-04 DIAGNOSIS — Z79899 Other long term (current) drug therapy: Secondary | ICD-10-CM | POA: Diagnosis not present

## 2015-09-04 DIAGNOSIS — C8311 Mantle cell lymphoma, lymph nodes of head, face, and neck: Secondary | ICD-10-CM

## 2015-09-04 LAB — CBC WITH DIFFERENTIAL/PLATELET
Basophils Absolute: 0 10*3/uL (ref 0–0.1)
Basophils Relative: 1 %
Eosinophils Absolute: 0.2 10*3/uL (ref 0–0.7)
Eosinophils Relative: 9 %
HCT: 39.3 % (ref 35.0–47.0)
Hemoglobin: 13.2 g/dL (ref 12.0–16.0)
Lymphocytes Relative: 21 %
Lymphs Abs: 0.5 10*3/uL — ABNORMAL LOW (ref 1.0–3.6)
MCH: 29.8 pg (ref 26.0–34.0)
MCHC: 33.5 g/dL (ref 32.0–36.0)
MCV: 89 fL (ref 80.0–100.0)
Monocytes Absolute: 0.4 10*3/uL (ref 0.2–0.9)
Monocytes Relative: 19 %
Neutro Abs: 1.1 10*3/uL — ABNORMAL LOW (ref 1.4–6.5)
Neutrophils Relative %: 50 %
Platelets: 282 10*3/uL (ref 150–440)
RBC: 4.42 MIL/uL (ref 3.80–5.20)
RDW: 16 % — ABNORMAL HIGH (ref 11.5–14.5)
WBC: 2.2 10*3/uL — ABNORMAL LOW (ref 3.6–11.0)

## 2015-09-05 ENCOUNTER — Other Ambulatory Visit: Payer: Self-pay | Admitting: Internal Medicine

## 2015-09-06 ENCOUNTER — Inpatient Hospital Stay (HOSPITAL_BASED_OUTPATIENT_CLINIC_OR_DEPARTMENT_OTHER): Payer: Medicare Other | Admitting: Internal Medicine

## 2015-09-06 ENCOUNTER — Inpatient Hospital Stay: Payer: Medicare Other

## 2015-09-06 VITALS — BP 116/67 | HR 70 | Temp 97.5°F | Resp 18 | Ht 67.0 in | Wt 204.1 lb

## 2015-09-06 DIAGNOSIS — D709 Neutropenia, unspecified: Secondary | ICD-10-CM

## 2015-09-06 DIAGNOSIS — Z7689 Persons encountering health services in other specified circumstances: Secondary | ICD-10-CM

## 2015-09-06 DIAGNOSIS — K219 Gastro-esophageal reflux disease without esophagitis: Secondary | ICD-10-CM

## 2015-09-06 DIAGNOSIS — C8311 Mantle cell lymphoma, lymph nodes of head, face, and neck: Secondary | ICD-10-CM

## 2015-09-06 DIAGNOSIS — M069 Rheumatoid arthritis, unspecified: Secondary | ICD-10-CM | POA: Diagnosis not present

## 2015-09-06 DIAGNOSIS — C8318 Mantle cell lymphoma, lymph nodes of multiple sites: Secondary | ICD-10-CM | POA: Diagnosis not present

## 2015-09-06 DIAGNOSIS — Z5111 Encounter for antineoplastic chemotherapy: Secondary | ICD-10-CM | POA: Diagnosis not present

## 2015-09-06 DIAGNOSIS — E785 Hyperlipidemia, unspecified: Secondary | ICD-10-CM

## 2015-09-06 DIAGNOSIS — M129 Arthropathy, unspecified: Secondary | ICD-10-CM | POA: Diagnosis not present

## 2015-09-06 DIAGNOSIS — Z79899 Other long term (current) drug therapy: Secondary | ICD-10-CM | POA: Diagnosis not present

## 2015-09-06 DIAGNOSIS — D702 Other drug-induced agranulocytosis: Secondary | ICD-10-CM

## 2015-09-06 LAB — CBC WITH DIFFERENTIAL/PLATELET
Basophils Absolute: 0 10*3/uL (ref 0–0.1)
Basophils Relative: 1 %
Eosinophils Absolute: 0.3 10*3/uL (ref 0–0.7)
Eosinophils Relative: 13 %
HCT: 37.3 % (ref 35.0–47.0)
Hemoglobin: 12.6 g/dL (ref 12.0–16.0)
Lymphocytes Relative: 21 %
Lymphs Abs: 0.4 10*3/uL — ABNORMAL LOW (ref 1.0–3.6)
MCH: 29.7 pg (ref 26.0–34.0)
MCHC: 33.7 g/dL (ref 32.0–36.0)
MCV: 88.1 fL (ref 80.0–100.0)
Monocytes Absolute: 0.4 10*3/uL (ref 0.2–0.9)
Monocytes Relative: 19 %
Neutro Abs: 0.9 10*3/uL — ABNORMAL LOW (ref 1.4–6.5)
Neutrophils Relative %: 46 %
Platelets: 276 10*3/uL (ref 150–440)
RBC: 4.24 MIL/uL (ref 3.80–5.20)
RDW: 16 % — ABNORMAL HIGH (ref 11.5–14.5)
WBC: 2 10*3/uL — ABNORMAL LOW (ref 3.6–11.0)

## 2015-09-06 LAB — COMPREHENSIVE METABOLIC PANEL
ALT: 13 U/L — ABNORMAL LOW (ref 14–54)
AST: 22 U/L (ref 15–41)
Albumin: 3.7 g/dL (ref 3.5–5.0)
Alkaline Phosphatase: 84 U/L (ref 38–126)
Anion gap: 3 — ABNORMAL LOW (ref 5–15)
BUN: 16 mg/dL (ref 6–20)
CO2: 29 mmol/L (ref 22–32)
Calcium: 8.7 mg/dL — ABNORMAL LOW (ref 8.9–10.3)
Chloride: 104 mmol/L (ref 101–111)
Creatinine, Ser: 0.61 mg/dL (ref 0.44–1.00)
GFR calc Af Amer: >60 mL/min (ref >60)
GFR calc non Af Amer: >60 mL/min (ref >60)
Glucose, Bld: 95 mg/dL (ref 65–99)
Potassium: 4 mmol/L (ref 3.5–5.1)
Sodium: 136 mmol/L (ref 135–145)
Total Bilirubin: 0.3 mg/dL (ref 0.3–1.2)
Total Protein: 7 g/dL (ref 6.5–8.1)

## 2015-09-06 MED ORDER — SODIUM CHLORIDE 0.9% FLUSH
10.0000 mL | Freq: Once | INTRAVENOUS | Status: AC
  Administered 2015-09-06: 10 mL via INTRAVENOUS
  Filled 2015-09-06: qty 10

## 2015-09-06 MED ORDER — HEPARIN SOD (PORK) LOCK FLUSH 100 UNIT/ML IV SOLN
500.0000 [IU] | Freq: Once | INTRAVENOUS | Status: AC
  Administered 2015-09-06: 500 [IU] via INTRAVENOUS
  Filled 2015-09-06: qty 5

## 2015-09-06 MED ORDER — TBO-FILGRASTIM 480 MCG/0.8ML ~~LOC~~ SOSY
480.0000 ug | PREFILLED_SYRINGE | Freq: Once | SUBCUTANEOUS | Status: AC
  Administered 2015-09-06: 480 ug via SUBCUTANEOUS
  Filled 2015-09-06: qty 0.8

## 2015-09-06 MED ORDER — PREDNISONE 50 MG PO TABS: ORAL_TABLET | ORAL | Status: DC

## 2015-09-06 NOTE — Progress Notes (Signed)
[place] NOTE  Patient Care Team: Jackolyn Confer, MD as PCP - General (Internal Medicine) Jackolyn Confer, MD (Internal Medicine)  CHIEF COMPLAINTS/PURPOSE OF CONSULTATION: MANTLE CELL LYMPHOMA  # JAN 2017- MANTLE CELL LYMPHOMA STAGE IV; [R Breast LN [place] Core Bx-1.2cm LN/R Ax LN-Bx]; cyclin D Pos; Mitotic rate-LOW; MIPI score [5/intermediate risk]; BMBx-Positive for involvement. Feb 9th- START Benda-Ritux with neulasta; Prolonged neutropenia; Disc Benda-Ritux  # April 13 th 2017- START R-CHOP   # Rheumatoid Arthritis [on MXT]; March 2017-MUGA scan-51 %  HISTORY OF PRESENTING ILLNESS:  Michelle [name] 75 y.o.  female with with a  mantle cell lymphoma  Stage IV status post cycle #1 appx 8 weeks ago. Patient did not receive cycle #2 as planned 4 weeks ago because of severe neutropenia. Patient received Granix 6 doses; slight improvement of her white count to 2.0/ ANC 1.1 approximately 2 days ago ago  Patient has no fevers. No chills. No nausea no vomiting. Appetite is good. She is gaining weight.   Also noted to have her lymph nodes improved. No chills cough improving.   ROS: A complete 10 point review of system is done which is negative except mentioned above in history of present illness  MEDICAL HISTORY:  Past Medical History  Diagnosis Date  . Arthritis   . Headache(784.0)   . GERD (gastroesophageal reflux disease)   . Hyperlipidemia     hx  . Lymphoma, mantle cell ([place]) 06/01/2015    bx of lymph node in right breast  . Lymphadenopathy of head and neck 01/2015    see on Thyroid ultrasound  . Rheumatoid arthritis ([place])   . History of methotrexate therapy     SURGICAL HISTORY: Past Surgical History  Procedure Laterality Date  . Cardiac catheterization  09/2004    [facility]; EF 60%  . Cardiac catheterization  08/2004    [facility]  . Peripheral vascular catheterization N/A 07/04/2015    Procedure: [name] Insertion;  Surgeon: [name], MD;  Location:  [place] [facility];  Service: Cardiovascular;  Laterality: N/A;    SOCIAL HISTORY: Social History   Social History  . Marital Status: Married    Spouse Name: N/A  . Number of Children: N/A  . Years of Education: N/A   Occupational History  . Not on file.   Social History Main Topics  . Smoking status: Never Smoker   . Smokeless tobacco: Never Used  . Alcohol Use: No  . Drug Use: No  . Sexual Activity: Not on file   Other Topics Concern  . 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    FAMILY HISTORY: Family History  Problem Relation Age of Onset  . Diabetes Mother   . Cholelithiasis Mother   . Hypertension Sister   . Diabetes Sister   . Arthritis Brother     ALLERGIES:  has No Known Allergies.  MEDICATIONS:  Current Outpatient Prescriptions  Medication Sig Dispense Refill  . Calcium Carbonate-Vit D-Min (CALTRATE 600+D PLUS) 600-800 MG-UNIT CHEW Chew 1 tablet by mouth 2 (two) times daily. 60 tablet 6  . levofloxacin (LEVAQUIN) 500 MG tablet Take 1 tablet (500 mg total) by mouth daily. 10 tablet 0  . lidocaine-prilocaine (EMLA) cream Apply 1 application topically as needed. 30 g 11  . omeprazole (PRILOSEC) 40 MG capsule TAKE ONE CAPSULE EVERY DAY 30  capsule 5  . ondansetron (ZOFRAN) 8 MG tablet Take 1 tablet (8 mg total) by mouth every 8 (eight) hours as needed for nausea or vomiting (start 3 days; after chemo). 40 tablet 0  . prochlorperazine (COMPAZINE) 10 MG tablet Take 1 tablet (10 mg total) by mouth every 6 (six) hours as needed for nausea or vomiting. 30 tablet 0  . Multiple Vitamins-Minerals (MULTIVITAMIN GUMMIES ADULT) CHEW Chew by mouth. Reported on 09/06/2015    . NON FORMULARY Reported on 09/06/2015    . traMADol (ULTRAM) 50 MG tablet Take 50 mg by mouth every morning. Reported on 09/06/2015    . vitamin E 400 UNIT capsule Take 400 Units by mouth daily. Reported on 09/06/2015      No current facility-administered medications for this visit.      [name]  PHYSICAL EXAMINATION: ECOG PERFORMANCE STATUS: 0 - Asymptomatic  Filed Vitals:   09/06/15 0925  BP: 116/67  Pulse: 70  Temp: 97.5 F (36.4 C)   Filed Weights   09/06/15 0925  Weight: 204 lb 2.3 oz (92.6 kg)    GENERAL: Well-nourished well-developed; Alert, no distress and comfortable. accompanied by her family.  EYES: no pallor or icterus OROPHARYNX: no thrush or ulceration; good dentition  NECK: supple, no masses felt LYMPH:  lymphadenopathy in the cervical, axillary/inguinal lymph nodes resolved. LUNGS: clear to auscultation and No wheeze or crackles HEART/CVS: regular rate & rhythm and no murmurs; No lower extremity edema ABDOMEN: abdomen soft, non-tender and normal bowel sounds; positive for hepatomegaly.  Musculoskeletal:no cyanosis of digits and no clubbing;  PSYCH: alert & oriented x 3 with fluent speech NEURO: no focal motor/sensory deficits SKIN: no rashes or significant lesions  LABORATORY DATA:  I have reviewed the data as listed Lab Results  Component Value Date   WBC 2.0* 09/06/2015   HGB 12.6 09/06/2015   HCT 37.3 09/06/2015   MCV 88.1 09/06/2015   PLT 276 09/06/2015    Recent Labs  07/12/15 0832 07/26/15 1107 08/09/15 0911 08/20/15 1115  NA 137 134* 135 133*  K 3.9 3.9 3.9 3.3*  CL 103 102 104 101  CO2 27 28 25 27   GLUCOSE 100* 111* 95 102*  BUN 19 19 17 16   CREATININE 0.73 0.68 0.65 0.72  CALCIUM 8.8* 9.2 8.9 8.2*  GFRNONAA >60 >60 >60 >60  GFRAA >60 >60 >60 >60  PROT 7.5 7.2 7.1  --   ALBUMIN 3.9 3.6 3.6  --   AST 22 20 25   --   ALT 12* 14 14  --   ALKPHOS 68 81 84  --   BILITOT 0.8 0.6 0.7  --      ASSESSMENT & PLAN:   # MANTLE CELL LYMPHOMA-  Stage IV-  Status post rituximab bendamustine cycle #1 approximately 8 weeks ago. She tolerated chemotherapy very well except for prolonged neutropenia [C discussion below]. Patient had a good response to  chemotherapy just after 1 cycle-with the improvement of the lymph nodes.   #  Severe/prolonged neutropenia-- white count today is 2.0 absolute neutrophil count of 0.9/hemoglobin platelets normal. Question bendamustine induced versus malignancy [less likely from malignancy]. Recommend Granix today tomorrow and on April 10th- if needed.we'll discontinue bendamustine based chemotherapy.  # I recommend treatment with R CHOP chemotherapy; MUGA scan shows ejection fraction of 51%. We will monitor closely. We will plan to start chemotherapy on April 13/1 week from now.  # Patient follow-up with me in 1 week prior to starting chemotherapy with labs.  Cammie Sickle, MD 09/06/2015 9:50 AM

## 2015-09-06 NOTE — Progress Notes (Signed)
Patient ambulates without assistance, accompanied by husband.  Brought to exam room 5, vitals stable and documented.  Patient denies pain or discomfort.

## 2015-09-07 ENCOUNTER — Inpatient Hospital Stay: Payer: Medicare Other

## 2015-09-07 VITALS — Temp 96.9°F

## 2015-09-07 DIAGNOSIS — K219 Gastro-esophageal reflux disease without esophagitis: Secondary | ICD-10-CM | POA: Diagnosis not present

## 2015-09-07 DIAGNOSIS — Z79899 Other long term (current) drug therapy: Secondary | ICD-10-CM | POA: Diagnosis not present

## 2015-09-07 DIAGNOSIS — D709 Neutropenia, unspecified: Secondary | ICD-10-CM | POA: Diagnosis not present

## 2015-09-07 DIAGNOSIS — M129 Arthropathy, unspecified: Secondary | ICD-10-CM | POA: Diagnosis not present

## 2015-09-07 DIAGNOSIS — M069 Rheumatoid arthritis, unspecified: Secondary | ICD-10-CM | POA: Diagnosis not present

## 2015-09-07 DIAGNOSIS — Z5111 Encounter for antineoplastic chemotherapy: Secondary | ICD-10-CM | POA: Diagnosis not present

## 2015-09-07 DIAGNOSIS — D702 Other drug-induced agranulocytosis: Secondary | ICD-10-CM

## 2015-09-07 DIAGNOSIS — Z7689 Persons encountering health services in other specified circumstances: Secondary | ICD-10-CM | POA: Diagnosis not present

## 2015-09-07 DIAGNOSIS — E785 Hyperlipidemia, unspecified: Secondary | ICD-10-CM | POA: Diagnosis not present

## 2015-09-07 DIAGNOSIS — C8318 Mantle cell lymphoma, lymph nodes of multiple sites: Secondary | ICD-10-CM | POA: Diagnosis not present

## 2015-09-07 MED ORDER — TBO-FILGRASTIM 480 MCG/0.8ML ~~LOC~~ SOSY
480.0000 ug | PREFILLED_SYRINGE | Freq: Once | SUBCUTANEOUS | Status: AC
  Administered 2015-09-07: 480 ug via SUBCUTANEOUS
  Filled 2015-09-07: qty 0.8

## 2015-09-10 ENCOUNTER — Inpatient Hospital Stay: Payer: Medicare Other | Admitting: Internal Medicine

## 2015-09-10 ENCOUNTER — Other Ambulatory Visit: Payer: Self-pay

## 2015-09-10 ENCOUNTER — Inpatient Hospital Stay: Payer: Medicare Other

## 2015-09-10 ENCOUNTER — Ambulatory Visit: Payer: Self-pay

## 2015-09-10 ENCOUNTER — Ambulatory Visit: Payer: Self-pay | Admitting: Hematology and Oncology

## 2015-09-10 DIAGNOSIS — Z7689 Persons encountering health services in other specified circumstances: Secondary | ICD-10-CM | POA: Diagnosis not present

## 2015-09-10 DIAGNOSIS — M129 Arthropathy, unspecified: Secondary | ICD-10-CM | POA: Diagnosis not present

## 2015-09-10 DIAGNOSIS — D709 Neutropenia, unspecified: Secondary | ICD-10-CM | POA: Diagnosis not present

## 2015-09-10 DIAGNOSIS — E785 Hyperlipidemia, unspecified: Secondary | ICD-10-CM | POA: Diagnosis not present

## 2015-09-10 DIAGNOSIS — C8318 Mantle cell lymphoma, lymph nodes of multiple sites: Secondary | ICD-10-CM | POA: Diagnosis not present

## 2015-09-10 DIAGNOSIS — C8311 Mantle cell lymphoma, lymph nodes of head, face, and neck: Secondary | ICD-10-CM

## 2015-09-10 DIAGNOSIS — Z5111 Encounter for antineoplastic chemotherapy: Secondary | ICD-10-CM | POA: Diagnosis not present

## 2015-09-10 DIAGNOSIS — K219 Gastro-esophageal reflux disease without esophagitis: Secondary | ICD-10-CM | POA: Diagnosis not present

## 2015-09-10 DIAGNOSIS — M069 Rheumatoid arthritis, unspecified: Secondary | ICD-10-CM | POA: Diagnosis not present

## 2015-09-10 DIAGNOSIS — D702 Other drug-induced agranulocytosis: Secondary | ICD-10-CM

## 2015-09-10 DIAGNOSIS — Z79899 Other long term (current) drug therapy: Secondary | ICD-10-CM | POA: Diagnosis not present

## 2015-09-10 LAB — CBC WITH DIFFERENTIAL/PLATELET
Basophils Absolute: 0 10*3/uL (ref 0–0.1)
Basophils Relative: 1 %
Eosinophils Absolute: 0.5 10*3/uL (ref 0–0.7)
Eosinophils Relative: 16 %
HCT: 38.8 % (ref 35.0–47.0)
Hemoglobin: 13.1 g/dL (ref 12.0–16.0)
Lymphocytes Relative: 30 %
Lymphs Abs: 0.9 10*3/uL — ABNORMAL LOW (ref 1.0–3.6)
MCH: 29.9 pg (ref 26.0–34.0)
MCHC: 33.8 g/dL (ref 32.0–36.0)
MCV: 88.3 fL (ref 80.0–100.0)
Monocytes Absolute: 0.9 10*3/uL (ref 0.2–0.9)
Monocytes Relative: 34 %
Neutro Abs: 0.6 10*3/uL — ABNORMAL LOW (ref 1.4–6.5)
Neutrophils Relative %: 19 %
Platelets: 254 10*3/uL (ref 150–440)
RBC: 4.4 MIL/uL (ref 3.80–5.20)
RDW: 15.7 % — ABNORMAL HIGH (ref 11.5–14.5)
WBC: 2.9 10*3/uL — ABNORMAL LOW (ref 3.6–11.0)

## 2015-09-10 MED ORDER — TBO-FILGRASTIM 480 MCG/0.8ML ~~LOC~~ SOSY
480.0000 ug | PREFILLED_SYRINGE | Freq: Once | SUBCUTANEOUS | Status: AC
  Administered 2015-09-10: 480 ug via SUBCUTANEOUS
  Filled 2015-09-10: qty 0.8

## 2015-09-11 ENCOUNTER — Telehealth: Payer: Self-pay | Admitting: *Deleted

## 2015-09-11 ENCOUNTER — Other Ambulatory Visit: Payer: Self-pay | Admitting: Internal Medicine

## 2015-09-11 DIAGNOSIS — C8311 Mantle cell lymphoma, lymph nodes of head, face, and neck: Secondary | ICD-10-CM

## 2015-09-11 DIAGNOSIS — D702 Other drug-induced agranulocytosis: Secondary | ICD-10-CM

## 2015-09-11 NOTE — Telephone Encounter (Signed)
-----   Message from Cammie Sickle, MD sent at 09/11/2015  8:18 AM EDT ----- Please set pt for granix for today/tomorrow; and will plan to proceed with R-CHOP chemo on 4/13 as planned. Thx

## 2015-09-11 NOTE — Telephone Encounter (Signed)
Spoke with patient. She just rcvd the msg from the cancer center. I asked pt to come tomorrow morning for granix injection. Scheduling will add to infusion schedule. Haywood Pao, Rn notified of add on to infusion schedule for tomorrow.

## 2015-09-11 NOTE — Telephone Encounter (Signed)
msg sent to scheduling to add pt to infusion schedule for Granix today and tommorrow.  Spoke with charge nurse in infusion-add on approved by Blima Singer, RN in chemo.  I attempted to contact pt- no answer. I left vm.

## 2015-09-11 NOTE — Telephone Encounter (Signed)
Left msgs x 2 for patient and x 1 for husband to ensure that pt received phone call as the neulasta inj has not yet been administered.. Left msg No answer. I contacted chemotherapy dept and spoke with vicki. Pt did not come yet to cancer center.

## 2015-09-12 ENCOUNTER — Inpatient Hospital Stay: Payer: Medicare Other

## 2015-09-12 DIAGNOSIS — Z5111 Encounter for antineoplastic chemotherapy: Secondary | ICD-10-CM | POA: Diagnosis not present

## 2015-09-12 DIAGNOSIS — D709 Neutropenia, unspecified: Secondary | ICD-10-CM | POA: Diagnosis not present

## 2015-09-12 DIAGNOSIS — M129 Arthropathy, unspecified: Secondary | ICD-10-CM | POA: Diagnosis not present

## 2015-09-12 DIAGNOSIS — D702 Other drug-induced agranulocytosis: Secondary | ICD-10-CM

## 2015-09-12 DIAGNOSIS — E785 Hyperlipidemia, unspecified: Secondary | ICD-10-CM | POA: Diagnosis not present

## 2015-09-12 DIAGNOSIS — K219 Gastro-esophageal reflux disease without esophagitis: Secondary | ICD-10-CM | POA: Diagnosis not present

## 2015-09-12 DIAGNOSIS — C8318 Mantle cell lymphoma, lymph nodes of multiple sites: Secondary | ICD-10-CM | POA: Diagnosis not present

## 2015-09-12 DIAGNOSIS — Z7689 Persons encountering health services in other specified circumstances: Secondary | ICD-10-CM | POA: Diagnosis not present

## 2015-09-12 DIAGNOSIS — Z79899 Other long term (current) drug therapy: Secondary | ICD-10-CM | POA: Diagnosis not present

## 2015-09-12 DIAGNOSIS — M069 Rheumatoid arthritis, unspecified: Secondary | ICD-10-CM | POA: Diagnosis not present

## 2015-09-12 MED ORDER — TBO-FILGRASTIM 480 MCG/0.8ML ~~LOC~~ SOSY
480.0000 ug | PREFILLED_SYRINGE | Freq: Once | SUBCUTANEOUS | Status: AC
  Administered 2015-09-12: 480 ug via SUBCUTANEOUS
  Filled 2015-09-12: qty 0.8

## 2015-09-13 ENCOUNTER — Inpatient Hospital Stay: Payer: Medicare Other

## 2015-09-13 ENCOUNTER — Inpatient Hospital Stay (HOSPITAL_BASED_OUTPATIENT_CLINIC_OR_DEPARTMENT_OTHER): Payer: Medicare Other | Admitting: Internal Medicine

## 2015-09-13 VITALS — BP 137/79 | HR 79 | Temp 97.7°F | Resp 18 | Wt 210.8 lb

## 2015-09-13 DIAGNOSIS — Z7689 Persons encountering health services in other specified circumstances: Secondary | ICD-10-CM

## 2015-09-13 DIAGNOSIS — C8318 Mantle cell lymphoma, lymph nodes of multiple sites: Secondary | ICD-10-CM

## 2015-09-13 DIAGNOSIS — C8311 Mantle cell lymphoma, lymph nodes of head, face, and neck: Secondary | ICD-10-CM

## 2015-09-13 DIAGNOSIS — K219 Gastro-esophageal reflux disease without esophagitis: Secondary | ICD-10-CM

## 2015-09-13 DIAGNOSIS — M069 Rheumatoid arthritis, unspecified: Secondary | ICD-10-CM

## 2015-09-13 DIAGNOSIS — Z79899 Other long term (current) drug therapy: Secondary | ICD-10-CM | POA: Diagnosis not present

## 2015-09-13 DIAGNOSIS — D709 Neutropenia, unspecified: Secondary | ICD-10-CM | POA: Diagnosis not present

## 2015-09-13 DIAGNOSIS — E785 Hyperlipidemia, unspecified: Secondary | ICD-10-CM

## 2015-09-13 DIAGNOSIS — Z5111 Encounter for antineoplastic chemotherapy: Secondary | ICD-10-CM | POA: Diagnosis not present

## 2015-09-13 DIAGNOSIS — M129 Arthropathy, unspecified: Secondary | ICD-10-CM

## 2015-09-13 LAB — COMPREHENSIVE METABOLIC PANEL
ALT: 15 U/L (ref 14–54)
AST: 29 U/L (ref 15–41)
Albumin: 3.7 g/dL (ref 3.5–5.0)
Alkaline Phosphatase: 127 U/L — ABNORMAL HIGH (ref 38–126)
Anion gap: 3 — ABNORMAL LOW (ref 5–15)
BUN: 10 mg/dL (ref 6–20)
CO2: 30 mmol/L (ref 22–32)
Calcium: 8.8 mg/dL — ABNORMAL LOW (ref 8.9–10.3)
Chloride: 107 mmol/L (ref 101–111)
Creatinine, Ser: 0.7 mg/dL (ref 0.44–1.00)
GFR calc Af Amer: >60 mL/min (ref >60)
GFR calc non Af Amer: >60 mL/min (ref >60)
Glucose, Bld: 98 mg/dL (ref 65–99)
Potassium: 3.3 mmol/L — ABNORMAL LOW (ref 3.5–5.1)
Sodium: 140 mmol/L (ref 135–145)
Total Bilirubin: 0.4 mg/dL (ref 0.3–1.2)
Total Protein: 6.9 g/dL (ref 6.5–8.1)

## 2015-09-13 LAB — CBC WITH DIFFERENTIAL/PLATELET
Basophils Absolute: 0.1 10*3/uL (ref 0–0.1)
Basophils Relative: 0 %
Eosinophils Absolute: 0.4 10*3/uL (ref 0–0.7)
Eosinophils Relative: 1 %
HCT: 40.3 % (ref 35.0–47.0)
Hemoglobin: 13.4 g/dL (ref 12.0–16.0)
Lymphocytes Relative: 3 %
Lymphs Abs: 1 10*3/uL (ref 1.0–3.6)
MCH: 29.2 pg (ref 26.0–34.0)
MCHC: 33.2 g/dL (ref 32.0–36.0)
MCV: 88 fL (ref 80.0–100.0)
Monocytes Absolute: 1.5 10*3/uL — ABNORMAL HIGH (ref 0.2–0.9)
Monocytes Relative: 4 %
Neutro Abs: 36.4 10*3/uL — ABNORMAL HIGH (ref 1.4–6.5)
Neutrophils Relative %: 92 %
Platelets: 237 10*3/uL (ref 150–440)
RBC: 4.59 MIL/uL (ref 3.80–5.20)
RDW: 16.4 % — ABNORMAL HIGH (ref 11.5–14.5)
WBC: 39.3 10*3/uL — ABNORMAL HIGH (ref 3.6–11.0)

## 2015-09-13 MED ORDER — SODIUM CHLORIDE 0.9 % IV SOLN
1500.0000 mg | Freq: Once | INTRAVENOUS | Status: AC
  Administered 2015-09-13: 1500 mg via INTRAVENOUS
  Filled 2015-09-13: qty 50

## 2015-09-13 MED ORDER — DOXORUBICIN HCL CHEMO IV INJECTION 2 MG/ML
50.0000 mg/m2 | Freq: Once | INTRAVENOUS | Status: AC
Start: 2015-09-13 — End: 2015-09-13
  Administered 2015-09-13: 104 mg via INTRAVENOUS
  Filled 2015-09-13: qty 52

## 2015-09-13 MED ORDER — SODIUM CHLORIDE 0.9 % IV SOLN
10.0000 mg | Freq: Once | INTRAVENOUS | Status: AC
  Administered 2015-09-13: 10 mg via INTRAVENOUS
  Filled 2015-09-13: qty 1

## 2015-09-13 MED ORDER — PALONOSETRON HCL INJECTION 0.25 MG/5ML
0.2500 mg | Freq: Once | INTRAVENOUS | Status: AC
  Administered 2015-09-13: 0.25 mg via INTRAVENOUS
  Filled 2015-09-13: qty 5

## 2015-09-13 MED ORDER — DIPHENHYDRAMINE HCL 25 MG PO CAPS
50.0000 mg | ORAL_CAPSULE | Freq: Once | ORAL | Status: AC
  Administered 2015-09-13: 50 mg via ORAL
  Filled 2015-09-13: qty 2

## 2015-09-13 MED ORDER — SODIUM CHLORIDE 0.9 % IV SOLN
Freq: Once | INTRAVENOUS | Status: AC
  Administered 2015-09-13: 10:00:00 via INTRAVENOUS
  Filled 2015-09-13: qty 1000

## 2015-09-13 MED ORDER — SODIUM CHLORIDE 0.9 % IV SOLN
375.0000 mg/m2 | Freq: Once | INTRAVENOUS | Status: AC
  Administered 2015-09-13: 800 mg via INTRAVENOUS
  Filled 2015-09-13: qty 20

## 2015-09-13 MED ORDER — HEPARIN SOD (PORK) LOCK FLUSH 100 UNIT/ML IV SOLN
500.0000 [IU] | Freq: Once | INTRAVENOUS | Status: AC | PRN
  Administered 2015-09-13: 500 [IU]
  Filled 2015-09-13: qty 5

## 2015-09-13 MED ORDER — VINCRISTINE SULFATE CHEMO INJECTION 1 MG/ML
2.0000 mg | Freq: Once | INTRAVENOUS | Status: AC
  Administered 2015-09-13: 2 mg via INTRAVENOUS
  Filled 2015-09-13: qty 2

## 2015-09-13 MED ORDER — SODIUM CHLORIDE 0.9% FLUSH
10.0000 mL | INTRAVENOUS | Status: DC | PRN
  Administered 2015-09-13: 10 mL
  Filled 2015-09-13: qty 10

## 2015-09-13 MED ORDER — SODIUM CHLORIDE 0.9 % IV SOLN: 375.0000 mg/m2 | Freq: Once | INTRAVENOUS | Status: DC

## 2015-09-13 MED ORDER — ACETAMINOPHEN 325 MG PO TABS
650.0000 mg | ORAL_TABLET | Freq: Once | ORAL | Status: AC
  Administered 2015-09-13: 650 mg via ORAL
  Filled 2015-09-13: qty 2

## 2015-09-13 MED ORDER — PEGFILGRASTIM 6 MG/0.6ML ~~LOC~~ PSKT
6.0000 mg | PREFILLED_SYRINGE | Freq: Once | SUBCUTANEOUS | Status: AC
  Administered 2015-09-13: 6 mg via SUBCUTANEOUS
  Filled 2015-09-13: qty 0.6

## 2015-09-13 NOTE — Progress Notes (Signed)
[place] NOTE  Patient Care Team: Jackolyn Confer, MD as PCP - General (Internal Medicine) Jackolyn Confer, MD (Internal Medicine)  CHIEF COMPLAINTS/PURPOSE OF CONSULTATION: MANTLE CELL LYMPHOMA  # JAN 2017- MANTLE CELL LYMPHOMA STAGE IV; [R Breast LN [place] Core Bx-1.2cm LN/R Ax LN-Bx]; cyclin D Pos; Mitotic rate-LOW; MIPI score [5/intermediate risk]; BMBx-Positive for involvement. Feb 9th- START Benda-Ritux with neulasta; Prolonged neutropenia; Disc Benda-Ritux;   # April 13 th 2017- START R-CHOP   # Rheumatoid Arthritis [on MXT]; March 2017-MUGA scan-51 %  HISTORY OF PRESENTING ILLNESS:  Michelle [name] 75 y.o.  female with with a  mantle cell lymphoma  Stage IV status post cycle #1 appx 9 weeks ago. Bendamustine rituximab has been discontinued. Patient is here to start R CHOP chemotherapy.  Patient also needed Granix yesterday; because her neutrophil count was 0.6 on April 10th.  Patient has no fevers. No chills. No nausea no vomiting. Appetite is good. She is gaining weight.   Also noted to have her lymph nodes improved. No chills cough improving.   ROS: A complete 10 point review of system is done which is negative except mentioned above in history of present illness  MEDICAL HISTORY:  Past Medical History  Diagnosis Date  . Arthritis   . Headache(784.0)   . GERD (gastroesophageal reflux disease)   . Hyperlipidemia     hx  . Lymphoma, mantle cell ([place]) 06/01/2015    bx of lymph node in right breast  . Lymphadenopathy of head and neck 01/2015    see on Thyroid ultrasound  . Rheumatoid arthritis ([place])   . History of methotrexate therapy     SURGICAL HISTORY: Past Surgical History  Procedure Laterality Date  . Cardiac catheterization  09/2004    [facility]; EF 60%  . Cardiac catheterization  08/2004    [facility]  . Peripheral vascular catheterization N/A 07/04/2015    Procedure: [name] Insertion;  Surgeon: [name], MD;  Location: [place] [facility];  Service: Cardiovascular;  Laterality: N/A;    SOCIAL HISTORY: Social History   Social History  . Marital Status: Married    Spouse Name: N/A  . Number of Children: N/A  . Years of Education: N/A   Occupational History  . Not on file.   Social History Main Topics  . Smoking status: Never Smoker   . Smokeless tobacco: Never Used  . Alcohol Use: No  . Drug Use: No  . Sexual Activity: Not on file   Other Topics Concern  . 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    FAMILY HISTORY: Family History  Problem Relation Age of Onset  . Diabetes Mother   . Cholelithiasis Mother   . Hypertension Sister   . Diabetes Sister   . Arthritis Brother     ALLERGIES:  has No Known Allergies.  MEDICATIONS:  Current Outpatient Prescriptions  Medication Sig Dispense Refill  . Calcium Carbonate-Vit D-Min (CALTRATE 600+D PLUS) 600-800 MG-UNIT CHEW Chew 1 tablet by mouth 2 (two) times daily. 60 tablet 6  . lidocaine-prilocaine (EMLA) cream Apply 1 application topically as needed. 30 g 11  . Multiple Vitamins-Minerals (MULTIVITAMIN GUMMIES ADULT) CHEW Chew by mouth. Reported on 09/06/2015    . NON FORMULARY Reported on 09/06/2015    . omeprazole (PRILOSEC) 40 MG capsule TAKE ONE CAPSULE EVERY DAY 30  capsule 5  . ondansetron (ZOFRAN) 8 MG tablet Take 1 tablet (8 mg total) by mouth every 8 (eight) hours as needed for nausea or vomiting (start 3 days; after chemo). 40 tablet 0  . predniSONE (DELTASONE) 50 MG tablet Take 2 tablets once day x 5 days. START on day of your chemotherapy. Take with food. 10 tablet 4  . prochlorperazine (COMPAZINE) 10 MG tablet Take 1 tablet (10 mg total) by mouth every 6 (six) hours as needed for nausea or vomiting. 30 tablet 0  . traMADol (ULTRAM) 50 MG tablet Take 50 mg by mouth every morning. Reported on 09/06/2015    . vitamin E 400 UNIT capsule Take 400 Units by mouth daily.  Reported on 09/06/2015     No current facility-administered medications for this visit.      [name]  PHYSICAL EXAMINATION: ECOG PERFORMANCE STATUS: 0 - Asymptomatic  Filed Vitals:   09/13/15 0908  BP: 137/79  Pulse: 79  Temp: 97.7 F (36.5 C)  Resp: 18   Filed Weights   09/13/15 0908  Weight: 210 lb 12.2 oz (95.6 kg)    GENERAL: Well-nourished well-developed; Alert, no distress and comfortable. accompanied by her family.  EYES: no pallor or icterus OROPHARYNX: no thrush or ulceration; good dentition  NECK: supple, no masses felt LYMPH:  lymphadenopathy in the cervical, axillary/inguinal lymph nodes resolved. LUNGS: clear to auscultation and No wheeze or crackles HEART/CVS: regular rate & rhythm and no murmurs; No lower extremity edema ABDOMEN: abdomen soft, non-tender and normal bowel sounds; positive for hepatomegaly.  Musculoskeletal:no cyanosis of digits and no clubbing;  PSYCH: alert & oriented x 3 with fluent speech NEURO: no focal motor/sensory deficits SKIN: no rashes or significant lesions  LABORATORY DATA:  I have reviewed the data as listed Lab Results  Component Value Date   WBC 39.3* 09/13/2015   HGB 13.4 09/13/2015   HCT 40.3 09/13/2015   MCV 88.0 09/13/2015   PLT 237 09/13/2015    Recent Labs  08/09/15 0911 08/20/15 1115 09/06/15 0907 09/13/15 0853  NA 135 133* 136 140  K 3.9 3.3* 4.0 3.3*  CL 104 101 104 107  CO2 25 27 29 30   GLUCOSE 95 102* 95 98  BUN 17 16 16 10   CREATININE 0.65 0.72 0.61 0.70  CALCIUM 8.9 8.2* 8.7* 8.8*  GFRNONAA >60 >60 >60 >60  GFRAA >60 >60 >60 >60  PROT 7.1  --  7.0 6.9  ALBUMIN 3.6  --  3.7 3.7  AST 25  --  22 29  ALT 14  --  13* 15  ALKPHOS 84  --  84 127*  BILITOT 0.7  --  0.3 0.4     ASSESSMENT & PLAN:   # MANTLE CELL LYMPHOMA-  Stage IV-  Status post rituximab bendamustine cycle #1 approximately 9 weeks ago. She tolerated chemotherapy very well except for prolonged neutropenia [C discussion  below]. Patient had a good response to chemotherapy just after 1 cycle-with the improvement of the lymph nodes.   # Given the severe prolonged neutropenia; needing multiple Granix injections in the last 8 weeks- I recommend discontinuation of bendamustine and rituximab. We will start the patient on our CHOP chemotherapy cycle #1 today.# Will plan to get a total of 5 cycles of R CHOP.  #  Patient's white count is 39/received Granix yesterday. I reviewed the potential side effects of the therapy including but not limited to nausea vomiting constipation; heart failure with Adriamycin and neuropathy with vincristine.  #  Severe/prolonged neutropenia-status post Neulasta after bendamustine rituximab and also needing multiple Granix injections in the last 8 weeks.  # Recheck CBC BMP in 10 days follow-up with me; also follow-up with me in 3 weeks R CHOP chemotherapy CBC CMP.    Cammie Sickle, MD 09/13/2015 9:28 AM

## 2015-09-13 NOTE — Progress Notes (Signed)
#   Given the severe prolonged neutropenia; needing multiple Granix injections in the last 8 weeks- I recommend discontinuation of bendamustine and rituximab. We will start the patient on our CHOP chemotherapy cycle #1 today.# Will plan to get a total of 5 cycles of R CHOP.  # Patient's white count is 39/received Granix yesterday. I reviewed the potential side effects of the therapy including but not limited to nausea vomiting constipation; heart failure with Adriamycin and neuropathy with vincristine.  # Severe/prolonged neutropenia-status post Neulasta after bendamustine rituximab and also needing multiple Granix injections in the last 8 weeks.   Per MD, wants Neulasta onpro today.

## 2015-09-24 ENCOUNTER — Inpatient Hospital Stay: Payer: Medicare Other

## 2015-09-24 ENCOUNTER — Inpatient Hospital Stay (HOSPITAL_BASED_OUTPATIENT_CLINIC_OR_DEPARTMENT_OTHER): Payer: Medicare Other | Admitting: Internal Medicine

## 2015-09-24 VITALS — BP 121/73 | HR 74 | Temp 96.9°F | Resp 18 | Wt 206.8 lb

## 2015-09-24 DIAGNOSIS — M069 Rheumatoid arthritis, unspecified: Secondary | ICD-10-CM | POA: Diagnosis not present

## 2015-09-24 DIAGNOSIS — E785 Hyperlipidemia, unspecified: Secondary | ICD-10-CM | POA: Diagnosis not present

## 2015-09-24 DIAGNOSIS — C8318 Mantle cell lymphoma, lymph nodes of multiple sites: Secondary | ICD-10-CM | POA: Diagnosis not present

## 2015-09-24 DIAGNOSIS — C8311 Mantle cell lymphoma, lymph nodes of head, face, and neck: Secondary | ICD-10-CM

## 2015-09-24 DIAGNOSIS — Z79899 Other long term (current) drug therapy: Secondary | ICD-10-CM

## 2015-09-24 DIAGNOSIS — D709 Neutropenia, unspecified: Secondary | ICD-10-CM | POA: Diagnosis not present

## 2015-09-24 DIAGNOSIS — Z7689 Persons encountering health services in other specified circumstances: Secondary | ICD-10-CM

## 2015-09-24 DIAGNOSIS — K219 Gastro-esophageal reflux disease without esophagitis: Secondary | ICD-10-CM

## 2015-09-24 DIAGNOSIS — Z5111 Encounter for antineoplastic chemotherapy: Secondary | ICD-10-CM | POA: Diagnosis not present

## 2015-09-24 DIAGNOSIS — M129 Arthropathy, unspecified: Secondary | ICD-10-CM | POA: Diagnosis not present

## 2015-09-24 LAB — BASIC METABOLIC PANEL
Anion gap: 2 — ABNORMAL LOW (ref 5–15)
BUN: 14 mg/dL (ref 6–20)
CO2: 28 mmol/L (ref 22–32)
Calcium: 8.9 mg/dL (ref 8.9–10.3)
Chloride: 107 mmol/L (ref 101–111)
Creatinine, Ser: 0.61 mg/dL (ref 0.44–1.00)
GFR calc Af Amer: >60 mL/min (ref >60)
GFR calc non Af Amer: >60 mL/min (ref >60)
Glucose, Bld: 97 mg/dL (ref 65–99)
Potassium: 4.8 mmol/L (ref 3.5–5.1)
Sodium: 137 mmol/L (ref 135–145)

## 2015-09-24 LAB — CBC WITH DIFFERENTIAL/PLATELET
Basophils Absolute: 0 10*3/uL (ref 0–0.1)
Basophils Relative: 2 %
Eosinophils Absolute: 0 10*3/uL (ref 0–0.7)
Eosinophils Relative: 3 %
HCT: 38 % (ref 35.0–47.0)
Hemoglobin: 12.6 g/dL (ref 12.0–16.0)
Lymphocytes Relative: 29 %
Lymphs Abs: 0.3 10*3/uL — ABNORMAL LOW (ref 1.0–3.6)
MCH: 29.2 pg (ref 26.0–34.0)
MCHC: 33.2 g/dL (ref 32.0–36.0)
MCV: 88 fL (ref 80.0–100.0)
Monocytes Absolute: 0.2 10*3/uL (ref 0.2–0.9)
Monocytes Relative: 15 %
Neutro Abs: 0.5 10*3/uL — ABNORMAL LOW (ref 1.4–6.5)
Neutrophils Relative %: 51 %
Platelets: 93 10*3/uL — ABNORMAL LOW (ref 150–440)
RBC: 4.31 MIL/uL (ref 3.80–5.20)
RDW: 15.7 % — ABNORMAL HIGH (ref 11.5–14.5)
WBC: 1 10*3/uL — CL (ref 3.6–11.0)

## 2015-09-24 NOTE — Progress Notes (Signed)
Patient states she has nausea in the morning when she first gets up. She takes her antiemetic and it gets better.  Also states she has had some back pain but has used massager and that is better also.

## 2015-09-24 NOTE — Progress Notes (Signed)
[place] [facility] NOTE  Patient Care Team: Jackolyn Confer, MD as PCP - General (Internal Medicine) Jackolyn Confer, MD (Internal Medicine)  CHIEF COMPLAINTS/PURPOSE OF CONSULTATION: MANTLE CELL LYMPHOMA  # JAN 2017- MANTLE CELL LYMPHOMA STAGE IV; [R Breast LN [place] Core Bx-1.2cm LN/R Ax LN-Bx]; cyclin D Pos; Mitotic rate-LOW; MIPI score [5/intermediate risk]; BMBx-Positive for involvement. Feb 9th- START Benda-Ritux with neulasta; Prolonged neutropenia; DISCONT- Benda-Ritux;   # April 13 th 2017- START R-CHOP   # Rheumatoid Arthritis [on MXT]; March 2017-MUGA scan-51 %  HISTORY OF PRESENTING ILLNESS:  Michelle [name] 75 y.o.  female with with a  mantle cell lymphoma  Stage IV status post cycle #1 R CHOP chemotherapy approximately 10 days ago.  Mild nausea postchemotherapy currently resolved. No fever no chills. No diarrhea constipation or tingling and numbness.    Appetite is good. She is gaining weight.   Also noted to have her lymph nodes improved. No chills cough improving.   ROS: A complete 10 point review of system is done which is negative except mentioned above in history of present illness  MEDICAL HISTORY:  Past Medical History  Diagnosis Date  . Arthritis   . Headache(784.0)   . GERD (gastroesophageal reflux disease)   . Hyperlipidemia     hx  . Lymphoma, mantle cell ([place]) 06/01/2015    bx of lymph node in right breast  . Lymphadenopathy of head and neck 01/2015    see on Thyroid ultrasound  . Rheumatoid arthritis ([place])   . History of methotrexate therapy     SURGICAL HISTORY: Past Surgical History  Procedure Laterality Date  . Cardiac catheterization  09/2004    [facility]; EF 60%  . Cardiac catheterization  08/2004    [facility]  . Peripheral vascular catheterization N/A 07/04/2015    Procedure: [name] Insertion;  Surgeon: [name], MD;  Location: [facility];  Service: Cardiovascular;  Laterality: N/A;    SOCIAL HISTORY: Social  History   Social History  . Marital Status: Married    Spouse Name: N/A  . Number of Children: N/A  . Years of Education: N/A   Occupational History  . Not on file.   Social History Main Topics  . Smoking status: Never Smoker   . Smokeless tobacco: Never Used  . Alcohol Use: No  . Drug Use: No  . Sexual Activity: Not on file   Other Topics Concern  . 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    FAMILY HISTORY: Family History  Problem Relation Age of Onset  . Diabetes Mother   . Cholelithiasis Mother   . Hypertension Sister   . Diabetes Sister   . Arthritis Brother     ALLERGIES:  has No Known Allergies.  MEDICATIONS:  Current Outpatient Prescriptions  Medication Sig Dispense Refill  . Calcium Carbonate-Vit D-Min (CALTRATE 600+D PLUS) 600-800 MG-UNIT CHEW Chew 1 tablet by mouth 2 (two) times daily. 60 tablet 6  . lidocaine-prilocaine (EMLA) cream Apply 1 application topically as needed. 30 g 11  . Multiple Vitamins-Minerals (MULTIVITAMIN GUMMIES ADULT) CHEW Chew by mouth. Reported on 09/06/2015    . NON FORMULARY Reported on 09/06/2015    . omeprazole (PRILOSEC) 40 MG capsule TAKE ONE CAPSULE EVERY DAY 30 capsule 5  . ondansetron (ZOFRAN) 8 MG tablet Take 1 tablet (8 mg total) by  mouth every 8 (eight) hours as needed for nausea or vomiting (start 3 days; after chemo). 40 tablet 0  . predniSONE (DELTASONE) 50 MG tablet Take 2 tablets once day x 5 days. START on day of your chemotherapy. Take with food. 10 tablet 4  . prochlorperazine (COMPAZINE) 10 MG tablet Take 1 tablet (10 mg total) by mouth every 6 (six) hours as needed for nausea or vomiting. 30 tablet 0  . traMADol (ULTRAM) 50 MG tablet Take 50 mg by mouth every morning. Reported on 09/06/2015    . fluticasone (FLONASE) 50 MCG/ACT nasal spray SPRAY TWICE IN EACH NOSTRIL ONCE DAILY  4  . vitamin E 400 UNIT capsule Take 400 Units by mouth  daily. Reported on 09/06/2015     No current facility-administered medications for this visit.      [name]  PHYSICAL EXAMINATION: ECOG PERFORMANCE STATUS: 0 - Asymptomatic  Filed Vitals:   09/24/15 0924  BP: 121/73  Pulse: 74  Temp: 96.9 F (36.1 C)  Resp: 18   Filed Weights   09/24/15 0924  Weight: 206 lb 12.7 oz (93.8 kg)    GENERAL: Well-nourished well-developed; Alert, no distress and comfortable. accompanied by her family.  EYES: no pallor or icterus OROPHARYNX: no thrush or ulceration; good dentition  NECK: supple, no masses felt LYMPH:  lymphadenopathy in the cervical, axillary/inguinal lymph nodes resolved. LUNGS: clear to auscultation and No wheeze or crackles HEART/CVS: regular rate & rhythm and no murmurs; No lower extremity edema ABDOMEN: abdomen soft, non-tender and normal bowel sounds; positive for hepatomegaly.  Musculoskeletal:no cyanosis of digits and no clubbing;  PSYCH: alert & oriented x 3 with fluent speech NEURO: no focal motor/sensory deficits SKIN: no rashes or significant lesions  LABORATORY DATA:  I have reviewed the data as listed Lab Results  Component Value Date   WBC 1.0* 09/24/2015   HGB 12.6 09/24/2015   HCT 38.0 09/24/2015   MCV 88.0 09/24/2015   PLT 93* 09/24/2015    Recent Labs  08/09/15 0911  09/06/15 0907 09/13/15 0853 09/24/15 0908  NA 135  < > 136 140 137  K 3.9  < > 4.0 3.3* 4.8  CL 104  < > 104 107 107  CO2 25  < > 29 30 28   GLUCOSE 95  < > 95 98 97  BUN 17  < > 16 10 14   CREATININE 0.65  < > 0.61 0.70 0.61  CALCIUM 8.9  < > 8.7* 8.8* 8.9  GFRNONAA >60  < > >60 >60 >60  GFRAA >60  < > >60 >60 >60  PROT 7.1  --  7.0 6.9  --   ALBUMIN 3.6  --  3.7 3.7  --   AST 25  --  22 29  --   ALT 14  --  13* 15  --   ALKPHOS 84  --  84 127*  --   BILITOT 0.7  --  0.3 0.4  --   < > = values in this interval not displayed.   ASSESSMENT & PLAN:   # MANTLE CELL LYMPHOMA-  Stage IV s/p RHCOP #1 Approximately 10 days  ago.   # White count 1.0/ANC 0.5. Hemoglobin 12 platelets 93. . Patient also received Neulasta. Afebrile. Repeat CBC on May 1. Hopefully patients blood counts would stay For cycle #2  # Proceed with cycle #2 of R-CHOP on May 4.   # Prolonged neutropenia- status post Rituxan bendamustine. Monitor the counts closely status post R CHOP.  #  Recheck CBC BMP in 10 days follow-up with me; also follow-up with me in 3 weeks R CHOP chemotherapy CBC CMP.    Cammie Sickle, MD 09/24/2015 10:44 AM

## 2015-10-01 ENCOUNTER — Inpatient Hospital Stay: Payer: Medicare Other | Attending: Internal Medicine

## 2015-10-01 DIAGNOSIS — Z79899 Other long term (current) drug therapy: Secondary | ICD-10-CM | POA: Diagnosis not present

## 2015-10-01 DIAGNOSIS — C8311 Mantle cell lymphoma, lymph nodes of head, face, and neck: Secondary | ICD-10-CM

## 2015-10-01 DIAGNOSIS — D509 Iron deficiency anemia, unspecified: Secondary | ICD-10-CM | POA: Diagnosis not present

## 2015-10-01 DIAGNOSIS — D709 Neutropenia, unspecified: Secondary | ICD-10-CM | POA: Diagnosis not present

## 2015-10-01 DIAGNOSIS — E785 Hyperlipidemia, unspecified: Secondary | ICD-10-CM | POA: Insufficient documentation

## 2015-10-01 DIAGNOSIS — M129 Arthropathy, unspecified: Secondary | ICD-10-CM | POA: Insufficient documentation

## 2015-10-01 DIAGNOSIS — Z9221 Personal history of antineoplastic chemotherapy: Secondary | ICD-10-CM | POA: Diagnosis not present

## 2015-10-01 DIAGNOSIS — M069 Rheumatoid arthritis, unspecified: Secondary | ICD-10-CM | POA: Insufficient documentation

## 2015-10-01 DIAGNOSIS — R591 Generalized enlarged lymph nodes: Secondary | ICD-10-CM | POA: Insufficient documentation

## 2015-10-01 DIAGNOSIS — Z7689 Persons encountering health services in other specified circumstances: Secondary | ICD-10-CM | POA: Insufficient documentation

## 2015-10-01 DIAGNOSIS — K219 Gastro-esophageal reflux disease without esophagitis: Secondary | ICD-10-CM | POA: Insufficient documentation

## 2015-10-01 DIAGNOSIS — C8310 Mantle cell lymphoma, unspecified site: Secondary | ICD-10-CM | POA: Insufficient documentation

## 2015-10-01 LAB — CBC WITH DIFFERENTIAL/PLATELET
Basophils Absolute: 0 10*3/uL (ref 0–0.1)
Basophils Relative: 1 %
Eosinophils Absolute: 0 10*3/uL (ref 0–0.7)
Eosinophils Relative: 2 %
HCT: 39.2 % (ref 35.0–47.0)
Hemoglobin: 13.3 g/dL (ref 12.0–16.0)
Lymphocytes Relative: 25 %
Lymphs Abs: 0.4 10*3/uL — ABNORMAL LOW (ref 1.0–3.6)
MCH: 29.5 pg (ref 26.0–34.0)
MCHC: 33.9 g/dL (ref 32.0–36.0)
MCV: 86.9 fL (ref 80.0–100.0)
Monocytes Absolute: 0.4 10*3/uL (ref 0.2–0.9)
Monocytes Relative: 25 %
Neutro Abs: 0.8 10*3/uL — ABNORMAL LOW (ref 1.4–6.5)
Neutrophils Relative %: 47 %
Platelets: 285 10*3/uL (ref 150–440)
RBC: 4.51 MIL/uL (ref 3.80–5.20)
RDW: 15.8 % — ABNORMAL HIGH (ref 11.5–14.5)
WBC: 1.6 10*3/uL — ABNORMAL LOW (ref 3.6–11.0)

## 2015-10-04 ENCOUNTER — Inpatient Hospital Stay: Payer: Medicare Other

## 2015-10-04 ENCOUNTER — Inpatient Hospital Stay (HOSPITAL_BASED_OUTPATIENT_CLINIC_OR_DEPARTMENT_OTHER): Payer: Medicare Other | Admitting: Internal Medicine

## 2015-10-04 ENCOUNTER — Telehealth: Payer: Self-pay | Admitting: Pharmacist

## 2015-10-04 VITALS — BP 109/70 | HR 69 | Temp 98.0°F | Resp 18 | Wt 208.9 lb

## 2015-10-04 DIAGNOSIS — D702 Other drug-induced agranulocytosis: Secondary | ICD-10-CM

## 2015-10-04 DIAGNOSIS — R591 Generalized enlarged lymph nodes: Secondary | ICD-10-CM

## 2015-10-04 DIAGNOSIS — D509 Iron deficiency anemia, unspecified: Secondary | ICD-10-CM

## 2015-10-04 DIAGNOSIS — Z9221 Personal history of antineoplastic chemotherapy: Secondary | ICD-10-CM

## 2015-10-04 DIAGNOSIS — K219 Gastro-esophageal reflux disease without esophagitis: Secondary | ICD-10-CM

## 2015-10-04 DIAGNOSIS — C8310 Mantle cell lymphoma, unspecified site: Secondary | ICD-10-CM | POA: Diagnosis not present

## 2015-10-04 DIAGNOSIS — M069 Rheumatoid arthritis, unspecified: Secondary | ICD-10-CM | POA: Diagnosis not present

## 2015-10-04 DIAGNOSIS — Z79899 Other long term (current) drug therapy: Secondary | ICD-10-CM

## 2015-10-04 DIAGNOSIS — E785 Hyperlipidemia, unspecified: Secondary | ICD-10-CM | POA: Diagnosis not present

## 2015-10-04 DIAGNOSIS — Z7689 Persons encountering health services in other specified circumstances: Secondary | ICD-10-CM

## 2015-10-04 DIAGNOSIS — D709 Neutropenia, unspecified: Secondary | ICD-10-CM | POA: Diagnosis not present

## 2015-10-04 DIAGNOSIS — M129 Arthropathy, unspecified: Secondary | ICD-10-CM | POA: Diagnosis not present

## 2015-10-04 DIAGNOSIS — C8311 Mantle cell lymphoma, lymph nodes of head, face, and neck: Secondary | ICD-10-CM

## 2015-10-04 LAB — CBC WITH DIFFERENTIAL/PLATELET
Basophils Absolute: 0 10*3/uL (ref 0–0.1)
Basophils Relative: 2 %
Eosinophils Absolute: 0 10*3/uL (ref 0–0.7)
Eosinophils Relative: 3 %
HCT: 35.9 % (ref 35.0–47.0)
Hemoglobin: 12.3 g/dL (ref 12.0–16.0)
Lymphocytes Relative: 35 %
Lymphs Abs: 0.4 10*3/uL — ABNORMAL LOW (ref 1.0–3.6)
MCH: 29.6 pg (ref 26.0–34.0)
MCHC: 34.3 g/dL (ref 32.0–36.0)
MCV: 86.3 fL (ref 80.0–100.0)
Monocytes Absolute: 0.6 10*3/uL (ref 0.2–0.9)
Monocytes Relative: 56 %
Neutro Abs: 0 10*3/uL — ABNORMAL LOW (ref 1.4–6.5)
Neutrophils Relative %: 4 %
Platelets: 300 10*3/uL (ref 150–440)
RBC: 4.16 MIL/uL (ref 3.80–5.20)
RDW: 15.8 % — ABNORMAL HIGH (ref 11.5–14.5)
WBC: 1.1 10*3/uL — CL (ref 3.6–11.0)

## 2015-10-04 LAB — COMPREHENSIVE METABOLIC PANEL
ALT: 12 U/L — ABNORMAL LOW (ref 14–54)
AST: 20 U/L (ref 15–41)
Albumin: 3.7 g/dL (ref 3.5–5.0)
Alkaline Phosphatase: 73 U/L (ref 38–126)
Anion gap: 6 (ref 5–15)
BUN: 16 mg/dL (ref 6–20)
CO2: 27 mmol/L (ref 22–32)
Calcium: 9.1 mg/dL (ref 8.9–10.3)
Chloride: 106 mmol/L (ref 101–111)
Creatinine, Ser: 0.58 mg/dL (ref 0.44–1.00)
GFR calc Af Amer: >60 mL/min (ref >60)
GFR calc non Af Amer: >60 mL/min (ref >60)
Glucose, Bld: 102 mg/dL — ABNORMAL HIGH (ref 65–99)
Potassium: 3.6 mmol/L (ref 3.5–5.1)
Sodium: 139 mmol/L (ref 135–145)
Total Bilirubin: 0.4 mg/dL (ref 0.3–1.2)
Total Protein: 6.7 g/dL (ref 6.5–8.1)

## 2015-10-04 MED ORDER — SODIUM CHLORIDE 0.9% FLUSH
10.0000 mL | Freq: Once | INTRAVENOUS | Status: AC
  Administered 2015-10-04: 10 mL via INTRAVENOUS
  Filled 2015-10-04: qty 10

## 2015-10-04 MED ORDER — TBO-FILGRASTIM 480 MCG/0.8ML ~~LOC~~ SOSY
480.0000 ug | PREFILLED_SYRINGE | Freq: Every day | SUBCUTANEOUS | Status: DC
  Administered 2015-10-04: 480 ug via SUBCUTANEOUS
  Filled 2015-10-04: qty 0.8

## 2015-10-04 MED ORDER — HEPARIN SOD (PORK) LOCK FLUSH 100 UNIT/ML IV SOLN
500.0000 [IU] | Freq: Once | INTRAVENOUS | Status: AC
  Administered 2015-10-04: 500 [IU] via INTRAVENOUS
  Filled 2015-10-04: qty 5

## 2015-10-04 MED ORDER — LEVOFLOXACIN 500 MG PO TABS: 500.0000 mg | ORAL_TABLET | Freq: Every day | ORAL | Status: DC

## 2015-10-04 NOTE — Progress Notes (Signed)
ANC is 0 today. Will not treat with RCHOP. Will give granix per MD

## 2015-10-04 NOTE — Progress Notes (Signed)
Patient ambulates independently, brought to exam room 5 accompanied by husband.  Patient denies pain or discomfort.  Medication record updated, information provided by patient.

## 2015-10-04 NOTE — Progress Notes (Signed)
[facility] NOTE  Patient Care Team: Jackolyn Confer, MD as PCP - General (Internal Medicine) Jackolyn Confer, MD (Internal Medicine)  CHIEF COMPLAINTS/PURPOSE OF CONSULTATION: MANTLE CELL LYMPHOMA  # JAN 2017- MANTLE CELL LYMPHOMA STAGE IV; [R Breast LN [place] Core Bx-1.2cm LN/R Ax LN-Bx]; cyclin D Pos; Mitotic rate-LOW; MIPI score [5/intermediate risk]; BMBx-Positive for involvement. Feb 9th- START Benda-Ritux with neulasta; Prolonged neutropenia; DISCONT- Benda-Ritux;   # April 13 th 2017- START R-CHOP x1;    # Rheumatoid Arthritis [on MXT]; March 2017-MUGA scan-51 %  HISTORY OF PRESENTING ILLNESS:  Michelle [name] 75 y.o.  female with with a  mantle cell lymphoma  Stage IV status post cycle #1 R CHOP chemotherapy approximately 3 weeks ago.  She feels good. Her appetite is good. She is gaining weight. Also noted to have improvement of her lymph nodes. No chills no fevers no cough.  Mild nausea postchemotherapy currently resolved. No fever no chills. No diarrhea constipation or tingling and numbness.   ROS: A complete 10 point review of system is done which is negative except mentioned above in history of present illness  MEDICAL HISTORY:  Past Medical History  Diagnosis Date  . Arthritis   . Headache(784.0)   . GERD (gastroesophageal reflux disease)   . Hyperlipidemia     hx  . Lymphoma, mantle cell ([place]) 06/01/2015    bx of lymph node in right breast  . Lymphadenopathy of head and neck 01/2015    see on Thyroid ultrasound  . Rheumatoid arthritis ([place])   . History of methotrexate therapy     SURGICAL HISTORY: Past Surgical History  Procedure Laterality Date  . Cardiac catheterization  09/2004    [facility]; EF 60%  . Cardiac catheterization  08/2004    [facility]  . Peripheral vascular catheterization N/A 07/04/2015    Procedure: [name] Insertion;  Surgeon: [name], MD;  Location: [facility];  Service: Cardiovascular;  Laterality: N/A;     SOCIAL HISTORY: Social History   Social History  . Marital Status: Married    Spouse Name: N/A  . Number of Children: N/A  . Years of Education: N/A   Occupational History  . Not on file.   Social History Main Topics  . Smoking status: Never Smoker   . Smokeless tobacco: Never Used  . Alcohol Use: No  . Drug Use: No  . Sexual Activity: Not on file   Other Topics Concern  . 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    FAMILY HISTORY: Family History  Problem Relation Age of Onset  . Diabetes Mother   . Cholelithiasis Mother   . Hypertension Sister   . Diabetes Sister   . Arthritis Brother     ALLERGIES:  has No Known Allergies.  MEDICATIONS:  Current Outpatient Prescriptions  Medication Sig Dispense Refill  . Calcium Carbonate-Vit D-Min (CALTRATE 600+D PLUS) 600-800 MG-UNIT CHEW Chew 1 tablet by mouth 2 (two) times daily. 60 tablet 6  . fluticasone (FLONASE) 50 MCG/ACT nasal spray SPRAY TWICE IN EACH NOSTRIL ONCE DAILY  4  . lidocaine-prilocaine (EMLA) cream Apply 1 application topically as needed. 30 g 11  . Multiple Vitamins-Minerals (MULTIVITAMIN GUMMIES ADULT) CHEW Chew by mouth. Reported on 09/06/2015    . NON FORMULARY Reported on 09/06/2015    . omeprazole (PRILOSEC) 40 MG capsule  TAKE ONE CAPSULE EVERY DAY 30 capsule 5  . prochlorperazine (COMPAZINE) 10 MG tablet Take 1 tablet (10 mg total) by mouth every 6 (six) hours as needed for nausea or vomiting. 30 tablet 0  . traMADol (ULTRAM) 50 MG tablet Take 50 mg by mouth every morning. Reported on 09/06/2015    . vitamin E 400 UNIT capsule Take 400 Units by mouth daily. Reported on 09/06/2015    . ondansetron (ZOFRAN) 8 MG tablet Take 1 tablet (8 mg total) by mouth every 8 (eight) hours as needed for nausea or vomiting (start 3 days; after chemo). (Patient not taking: Reported on 10/04/2015) 40 tablet 0   No current  facility-administered medications for this visit.   Facility-Administered Medications Ordered in Other Visits  Medication Dose Route Frequency Provider Last Rate Last Dose  . heparin lock flush 100 unit/mL  500 Units Intravenous Once Cammie Sickle, MD      . sodium chloride flush (NS) 0.9 % injection 10 mL  10 mL Intravenous Once Cammie Sickle, MD          .  PHYSICAL EXAMINATION: ECOG PERFORMANCE STATUS: 0 - Asymptomatic  Filed Vitals:   10/04/15 0946  BP: 109/70  Pulse: 69  Temp: 98 F (36.7 C)   Filed Weights   10/04/15 0946  Weight: 208 lb 14.2 oz (94.75 kg)    GENERAL: Well-nourished well-developed; Alert, no distress and comfortable. accompanied by her family.  EYES: no pallor or icterus OROPHARYNX: no thrush or ulceration; good dentition  NECK: supple, no masses felt LYMPH:  lymphadenopathy in the cervical, axillary/inguinal lymph nodes resolved. LUNGS: clear to auscultation and No wheeze or crackles HEART/CVS: regular rate & rhythm and no murmurs; No lower extremity edema ABDOMEN: abdomen soft, non-tender and normal bowel sounds; positive for hepatomegaly.  Musculoskeletal:no cyanosis of digits and no clubbing;  PSYCH: alert & oriented x 3 with fluent speech NEURO: no focal motor/sensory deficits SKIN: no rashes or significant lesions  LABORATORY DATA:  I have reviewed the data as listed Lab Results  Component Value Date   WBC 1.1* 10/04/2015   HGB 12.3 10/04/2015   HCT 35.9 10/04/2015   MCV 86.3 10/04/2015   PLT 300 10/04/2015    Recent Labs  09/06/15 0907 09/13/15 0853 09/24/15 0908 10/04/15 0913  NA 136 140 137 139  K 4.0 3.3* 4.8 3.6  CL 104 107 107 106  CO2 '29 30 28 27  ' GLUCOSE 95 98 97 102*  BUN '16 10 14 16  ' CREATININE 0.61 0.70 0.61 0.58  CALCIUM 8.7* 8.8* 8.9 9.1  GFRNONAA >60 >60 >60 >60  GFRAA >60 >60 >60 >60  PROT 7.0 6.9  --  6.7  ALBUMIN 3.7 3.7  --  3.7  AST 22 29  --  20  ALT 13* 15  --  12*  ALKPHOS  84 127*  --  73  BILITOT 0.3 0.4  --  0.4     ASSESSMENT & PLAN:   # MANTLE CELL LYMPHOMA-  Stage IV s/p RHCOP #1 3 weeks ago. Patient received Neulasta; however today white count is 1.1 ANC 0; hemoglobin 12.3 platelets 300. Hold chemotherapy cycle #2 of R CHOP today.  # Drug-induced neutropenia- start Granix for 7 doses. Also start Levaquin 500 mg once a day prophylaxis.   # Given the significant difficulty [prolonged and severe] with neutropenia after each cycle of chemotherapy/I will recommend restaging with repeat PET scan; and also a bone marrow biopsy for further evaluation.  #  Also follow-up in approximately 1 week/ check blood counts Monday Wednesday Friday. I'll also speak to Dr. Lonia Chimera- has previously evaluated the patient at [facility].    Cammie Sickle, MD 10/04/2015 10:10 AM

## 2015-10-04 NOTE — Progress Notes (Signed)
933am RN received phone call from Collene Mares in cancer center lab re: critical value. ANC 0.0  Read back process performed.  MD informed and read back process performed at 945am.   RN spoke with Darrol Jump in speciality scheduling.  Bone marrow biopsy to be planned for Monday 10/08/15. Pt can get labs performed in specials and then come to cancer center after procedure for the granix injection.

## 2015-10-04 NOTE — Telephone Encounter (Signed)
anc 0.0. Holding rchop.   Proceed with granix today, tom, & mon-Friday next week - per MD

## 2015-10-04 NOTE — Patient Instructions (Signed)
Food Safety for the Immunocompromised Person If you are immunocompromised, it is important to follow food safety guidelines. Bacteria and other harmful germs are more likely to be in raw or fresh foods. Thoroughly cooking foods destroys these germs. Fresh vegetables should be cooked until tender; meats should be cooked until well-done; and eggs should be cooked until the yolks are firm. Dairy products, juices, and ciders should have the word "pasteurized" on the label. The following information can help you choose the right foods and prepare them correctly in order to keep you healthy. WHAT DO I NEED TO KNOW ABOUT FOOD SAFETY?  Wash your hands with soap and water before and after preparing food. Always wash your hands after touching raw meat.  Wash any surfaces that you will be using to prepare food. Use hot, soapy water.  Keep foods separate when you are preparing and cooking a meal. Do not use the same knife or cutting board to cut your fresh produce and raw meat.  Cook food to the right temperature:  Beef, pork, veal, lamb, and steak should be cooked to 145F (63C) with a 3-minute rest time.  Fish should be cooked to 145F.  Ground beef, pork, veal, and lamb should be cooked to 160F (71C).  Egg dishes should be cooked to 160F.  Poultry (whole, pieces, and ground) should be cooked to 165F (74C).  Put any leftovers in the refrigerator as soon as possible to stop bacteria from growing and to keep your food from going bad.  Hot foods should be kept at 171F (60C) or more, and cold foods should be kept at 71F (4.4C) or less. PREPARATION GUIDELINES Cooking and Eating Utensil Preparation  Wash the following with soap and hot water before and after use:  Countertops.  Contractor.  Cooking utensils.  Silverware.  Flatware.  Pots and pans.  Dishes.  Glassware.  Air dry all cooking and eating utensils. Do not dry them with a cloth towel. Food Preparation  Do not  buy food that has passed the expiration or "use by" date.  Wash your hands often for at least 20 seconds with warm, soapy water and dry them with paper towels. This is especially important after you have touched raw meat, eggs, or fish.  Wash fruits and vegetables thoroughly under cold running water before peeling or cutting them. Individually scrub produce that has a thick, rough skin or rind, such as cabbage. Do not use commercial rinses to wash fruits and vegetables.  Rinse packaged salads, slaw mix, and other prepared produce under cold running water. Do this even if the food is labeled "prewashed."  Thaw frozen foods in the refrigerator overnight or quickly in the microwave. Do not thaw food on countertops.  Do not touch or use raw yeast. There is a risk that you could breathe it in. Raw yeast is used to make bread.  Cook all perishable foods thoroughly.  Do not leave easily spoiled items at room temperature for more than 10-15 minutes.  Refrigerate leftovers as soon as possible in small, airtight, shallow containers.  Eat leftovers only if they have been stored properly. Do not eat leftovers that have been around for longer than 24 hours.  Boil marinades before using them on raw foods.  Clean the outside of your canned goods before opening them.  Do not put cooked food on a surface that you had placed raw meat, fish, or eggs on. Those surfaces must be washed with warm, soapy water before you use  them again.  Always place raw meat, seafood, and eggs in plastic bags before putting them in your shopping cart at the grocery story. Also, bag those items separately and not with other food you have purchased. Once home, place them in your refrigerator right away. WHAT FOODS CAN I NOT EAT? Grains  Fresh bakery breads, muffins, cakes, donuts, and cream- or custard-filled cakes.  Raw or uncooked grain products.  Beer that has "unpasteurized" on the label, is made with uncooked brewer's  yeast, is homemade or home brewed, or is from a microbrewery. Vegetables  Unwashedraw vegetables and salads.  Unpasteurized vegetable juice.  Raw vegetable sprouts, such as alfalfa, radish, broccoli, and mung bean.  Salads from the deli or salad bar. Fruits  Unwashed raw fruit.  Unpasteurized fruit juices.  Fresh apple cider. Meat and Other Protein Sources  All raw, uncooked, undercooked, or rare meat, fish, eggs, poultry, or tofu. This includes:  Sushi.  Partially cooked seafood, such as shrimp and crab.  Raw shellfish, such as oysters, clams, mussels, and scallops and their juices.  Refrigerated smoked seafood, including smoked salmon and lox.  Unpasteurized, refrigerated pates or meat spreads.  Unheated cold cuts from the deli, including hot dogs, dry or fermented sausage, or other deli meat. These are okay if you heat them until they are steaming or reach 165F.  Hard cured salami in natural wrap.  Any meat, poultry, or seafood salad made at the grocery store or at deli-style restaurants.  Pickled fish.  Any fermented foods such as tempeh or miso products.  Unprocessed nuts, unroasted raw nuts, and roasted nuts in the shell.  Food products made with raw or undercooked eggs, such as Caesar salad dressing, mayonnaise, homemade cookie dough, cake batters, and eggnog. While most products at the grocery store are made with pasteurized eggs, you still need to read the label to make sure. Do not eat anything that has the word "unpasteurized" on the label. Dairy  Soft cheeses made from unpasteurized milk or molds (such as feta, Brie, Camembert, and Gorgonzola), blue-veined cheese (such as Stilton and Roquefort), Mexican-style cheeses (such as [place]), and farmer's cheese.  Unpasteurized or raw milk cheese, yogurt, and other milk products.  Cheeses containing chili peppers or other uncooked vegetables.  Any imported cheeses.  Any cheese sliced at a  deli. Beverages  Unboiled well water.  Cold-brewed tea or "sun teas" made with warm or cold water.  Mate tea or yerba mate tea.  Raw, unpasteurized milk.  Eggnog or milkshakes made with raw eggs.  Unpasteurized fruit and vegetable juices.  Fresh apple cider.  Wine or beer that is unpasteurized, homemade or home brewed, or from a microbrewery. Condiments  Uncooked herbs or spices.  Raw or unpasteurized honey.  Prepackaged salsas stored in a refrigerated case. Sweets/Desserts  Unrefrigerated custard or cream-filled pastry products.  Soft-serve ice cream or frozen yogurt.  Hand-packed ice cream or frozen yogurt. Fats  Fresh salad dressings containing raw eggs or aged cheese (such as blue cheese and Roquefort) stored in a refrigerated case.   This information is not intended to replace advice given to you by your health care provider. Make sure you discuss any questions you have with your health care provider.   Document Released: 03/16/2007 Document Revised: 06/09/2014 Document Reviewed: 10/18/2013 Elsevier Interactive Patient Education 2016 [employer]  

## 2015-10-05 ENCOUNTER — Inpatient Hospital Stay: Payer: Medicare Other

## 2015-10-05 ENCOUNTER — Other Ambulatory Visit: Payer: Self-pay | Admitting: Internal Medicine

## 2015-10-05 VITALS — BP 113/76 | HR 77 | Temp 97.0°F | Resp 18

## 2015-10-05 DIAGNOSIS — E785 Hyperlipidemia, unspecified: Secondary | ICD-10-CM | POA: Diagnosis not present

## 2015-10-05 DIAGNOSIS — R591 Generalized enlarged lymph nodes: Secondary | ICD-10-CM | POA: Diagnosis not present

## 2015-10-05 DIAGNOSIS — D509 Iron deficiency anemia, unspecified: Secondary | ICD-10-CM | POA: Diagnosis not present

## 2015-10-05 DIAGNOSIS — D702 Other drug-induced agranulocytosis: Secondary | ICD-10-CM

## 2015-10-05 DIAGNOSIS — M129 Arthropathy, unspecified: Secondary | ICD-10-CM | POA: Diagnosis not present

## 2015-10-05 DIAGNOSIS — K219 Gastro-esophageal reflux disease without esophagitis: Secondary | ICD-10-CM | POA: Diagnosis not present

## 2015-10-05 DIAGNOSIS — C8310 Mantle cell lymphoma, unspecified site: Secondary | ICD-10-CM | POA: Diagnosis not present

## 2015-10-05 DIAGNOSIS — Z9221 Personal history of antineoplastic chemotherapy: Secondary | ICD-10-CM | POA: Diagnosis not present

## 2015-10-05 DIAGNOSIS — M069 Rheumatoid arthritis, unspecified: Secondary | ICD-10-CM | POA: Diagnosis not present

## 2015-10-05 DIAGNOSIS — Z79899 Other long term (current) drug therapy: Secondary | ICD-10-CM | POA: Diagnosis not present

## 2015-10-05 DIAGNOSIS — Z7689 Persons encountering health services in other specified circumstances: Secondary | ICD-10-CM | POA: Diagnosis not present

## 2015-10-05 DIAGNOSIS — D709 Neutropenia, unspecified: Secondary | ICD-10-CM | POA: Diagnosis not present

## 2015-10-05 MED ORDER — TBO-FILGRASTIM 480 MCG/0.8ML ~~LOC~~ SOSY
480.0000 ug | PREFILLED_SYRINGE | Freq: Once | SUBCUTANEOUS | Status: AC
  Administered 2015-10-05: 480 ug via SUBCUTANEOUS
  Filled 2015-10-05: qty 0.8

## 2015-10-08 ENCOUNTER — Ambulatory Visit
Admission: RE | Admit: 2015-10-08 | Discharge: 2015-10-08 | Disposition: A | Discharge status: Home / Self Care | Payer: Medicare Other | Source: Ambulatory Visit | Attending: Internal Medicine | Admitting: Internal Medicine

## 2015-10-08 ENCOUNTER — Inpatient Hospital Stay: Payer: Medicare Other

## 2015-10-08 DIAGNOSIS — Z7689 Persons encountering health services in other specified circumstances: Secondary | ICD-10-CM | POA: Diagnosis not present

## 2015-10-08 DIAGNOSIS — M129 Arthropathy, unspecified: Secondary | ICD-10-CM | POA: Diagnosis not present

## 2015-10-08 DIAGNOSIS — Z8261 Family history of arthritis: Secondary | ICD-10-CM | POA: Insufficient documentation

## 2015-10-08 DIAGNOSIS — D469 Myelodysplastic syndrome, unspecified: Secondary | ICD-10-CM | POA: Diagnosis not present

## 2015-10-08 DIAGNOSIS — D72819 Decreased white blood cell count, unspecified: Secondary | ICD-10-CM | POA: Diagnosis not present

## 2015-10-08 DIAGNOSIS — Z79899 Other long term (current) drug therapy: Secondary | ICD-10-CM | POA: Insufficient documentation

## 2015-10-08 DIAGNOSIS — R938 Abnormal findings on diagnostic imaging of other specified body structures: Secondary | ICD-10-CM | POA: Diagnosis not present

## 2015-10-08 DIAGNOSIS — D702 Other drug-induced agranulocytosis: Secondary | ICD-10-CM

## 2015-10-08 DIAGNOSIS — Z8572 Personal history of non-Hodgkin lymphomas: Secondary | ICD-10-CM | POA: Diagnosis not present

## 2015-10-08 DIAGNOSIS — Z833 Family history of diabetes mellitus: Secondary | ICD-10-CM | POA: Diagnosis not present

## 2015-10-08 DIAGNOSIS — R51 Headache: Secondary | ICD-10-CM | POA: Diagnosis not present

## 2015-10-08 DIAGNOSIS — Z8249 Family history of ischemic heart disease and other diseases of the circulatory system: Secondary | ICD-10-CM | POA: Insufficient documentation

## 2015-10-08 DIAGNOSIS — C8311 Mantle cell lymphoma, lymph nodes of head, face, and neck: Secondary | ICD-10-CM | POA: Insufficient documentation

## 2015-10-08 DIAGNOSIS — K219 Gastro-esophageal reflux disease without esophagitis: Secondary | ICD-10-CM | POA: Diagnosis not present

## 2015-10-08 DIAGNOSIS — Z9889 Other specified postprocedural states: Secondary | ICD-10-CM | POA: Diagnosis not present

## 2015-10-08 DIAGNOSIS — E785 Hyperlipidemia, unspecified: Secondary | ICD-10-CM | POA: Diagnosis not present

## 2015-10-08 DIAGNOSIS — M069 Rheumatoid arthritis, unspecified: Secondary | ICD-10-CM | POA: Diagnosis not present

## 2015-10-08 DIAGNOSIS — Z08 Encounter for follow-up examination after completed treatment for malignant neoplasm: Secondary | ICD-10-CM | POA: Diagnosis not present

## 2015-10-08 DIAGNOSIS — D509 Iron deficiency anemia, unspecified: Secondary | ICD-10-CM | POA: Diagnosis not present

## 2015-10-08 DIAGNOSIS — R591 Generalized enlarged lymph nodes: Secondary | ICD-10-CM | POA: Diagnosis not present

## 2015-10-08 DIAGNOSIS — C8310 Mantle cell lymphoma, unspecified site: Secondary | ICD-10-CM | POA: Diagnosis not present

## 2015-10-08 DIAGNOSIS — D709 Neutropenia, unspecified: Secondary | ICD-10-CM | POA: Diagnosis not present

## 2015-10-08 DIAGNOSIS — Z9221 Personal history of antineoplastic chemotherapy: Secondary | ICD-10-CM | POA: Diagnosis not present

## 2015-10-08 LAB — DIFFERENTIAL
Band Neutrophils: 0 %
Basophils Absolute: 0 10*3/uL (ref 0–0.1)
Basophils Relative: 0 %
Blasts: 0 %
Eosinophils Absolute: 0.1 10*3/uL (ref 0–0.7)
Eosinophils Relative: 6 %
Lymphocytes Relative: 34 %
Lymphs Abs: 0.4 10*3/uL — ABNORMAL LOW (ref 1.0–3.6)
Metamyelocytes Relative: 0 %
Monocytes Absolute: 0.7 10*3/uL (ref 0.2–0.9)
Monocytes Relative: 58 %
Myelocytes: 0 %
Neutro Abs: 0 10*3/uL — ABNORMAL LOW (ref 1.4–6.5)
Neutrophils Relative %: 2 %
Other: 0 %
Promyelocytes Absolute: 0 %
nRBC: 0 /100 WBC

## 2015-10-08 LAB — CBC
HCT: 38.3 % (ref 35.0–47.0)
Hemoglobin: 12.5 g/dL (ref 12.0–16.0)
MCH: 29 pg (ref 26.0–34.0)
MCHC: 32.8 g/dL (ref 32.0–36.0)
MCV: 88.6 fL (ref 80.0–100.0)
Platelets: 294 10*3/uL (ref 150–440)
RBC: 4.32 MIL/uL (ref 3.80–5.20)
RDW: 16.5 % — ABNORMAL HIGH (ref 11.5–14.5)
WBC: 1.2 10*3/uL — CL (ref 3.6–11.0)

## 2015-10-08 LAB — PROTIME-INR
INR: 0.97
Prothrombin Time: 13.1 seconds (ref 11.4–15.0)

## 2015-10-08 LAB — APTT: aPTT: 33 seconds (ref 24–36)

## 2015-10-08 MED ORDER — FENTANYL CITRATE (PF) 100 MCG/2ML IJ SOLN
INTRAMUSCULAR | Status: AC | PRN
  Administered 2015-10-08 (×2): 25 ug via INTRAVENOUS
  Administered 2015-10-08: 50 ug via INTRAVENOUS

## 2015-10-08 MED ORDER — SODIUM CHLORIDE 0.9 % IV SOLN
INTRAVENOUS | Status: DC
  Administered 2015-10-08 (×2): via INTRAVENOUS

## 2015-10-08 MED ORDER — TBO-FILGRASTIM 480 MCG/0.8ML ~~LOC~~ SOSY
480.0000 ug | PREFILLED_SYRINGE | Freq: Once | SUBCUTANEOUS | Status: AC
  Administered 2015-10-08: 480 ug via SUBCUTANEOUS
  Filled 2015-10-08: qty 0.8

## 2015-10-08 NOTE — Consult Note (Signed)
Chief Complaint: History of lymphoma, patient returns today for repeat CT-guided bone marrow biopsy and aspiration at the request of Brahmanday,Govinda R  Referring Physician(s): Brahmanday,Govinda R  Patient Status: Out-pt  History of Present Illness: Michelle [name] is a 75 y.o. 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 Note, patient underwent successful CT-guided bone marrow biopsy and aspiration by my partner, Dr. Earleen Newport on 07/05/2015.  Secondary to recurrent severe and prolonged neutropenia after each cycle of chemotherapy, request has been made for repeat CT-guided bone marrow biopsy and aspiration for further evaluation.  Patient is currently without complaint. No fever or chills. No chest pain or shortness of breath.  The patient thought she was simply undergoing a CT scan today and as such presents after eating a full breakfast. Patient still wishes to pursue the CT-guided bone marrow biopsy and aspiration with Fentanyl and local anesthetic.  Past Medical History  Diagnosis Date  . Arthritis   . Headache(784.0)   . GERD (gastroesophageal reflux disease)   . Hyperlipidemia     hx  . Lymphoma, mantle cell ([place]) 06/01/2015    bx of lymph node in right breast  . Lymphadenopathy of head and neck 01/2015    see on Thyroid ultrasound  . Rheumatoid arthritis ([place])   . History of methotrexate therapy     Past Surgical History  Procedure Laterality Date  . Cardiac catheterization  09/2004    [facility]; EF 60%  . Cardiac catheterization  08/2004    [facility]  . Peripheral vascular catheterization N/A 07/04/2015    Procedure: [name] Insertion;  Surgeon: [name], MD;  Location: [facility];  Service: Cardiovascular;  Laterality: N/A;    Allergies: Review of patient's allergies indicates no known allergies.  Medications: Prior to  Admission medications   Medication Sig Start Date End Date Taking? Authorizing Provider  Calcium Carbonate-Vit D-Min (CALTRATE 600+D PLUS) 600-800 MG-UNIT CHEW Chew 1 tablet by mouth 2 (two) times daily. 09/18/11   Jackolyn Confer, MD  fluticasone (FLONASE) 50 MCG/ACT nasal spray SPRAY TWICE IN EACH NOSTRIL ONCE DAILY 09/13/15   Historical Provider, MD  levofloxacin (LEVAQUIN) 500 MG tablet Take 1 tablet (500 mg total) by mouth daily. 10/04/15   Cammie Sickle, MD  lidocaine-prilocaine (EMLA) cream Apply 1 application topically as needed. 06/29/15   Cammie Sickle, MD  Multiple Vitamins-Minerals (MULTIVITAMIN GUMMIES ADULT) CHEW Chew by mouth. Reported on 09/06/2015    Historical Provider, MD  NON FORMULARY Reported on 09/06/2015    Historical Provider, MD  omeprazole (PRILOSEC) 40 MG capsule TAKE ONE CAPSULE EVERY DAY 07/12/15   Cammie Sickle, MD  ondansetron (ZOFRAN) 8 MG tablet Take 1 tablet (8 mg total) by mouth every 8 (eight) hours as needed for nausea or vomiting (start 3 days; after chemo). Patient not taking: Reported on 10/04/2015 07/12/15   Cammie Sickle, MD  prochlorperazine (COMPAZINE) 10 MG tablet Take 1 tablet (10 mg total) by mouth every 6 (six) hours as needed for nausea or vomiting. 07/12/15   Cammie Sickle, MD  traMADol (ULTRAM) 50 MG tablet Take 50 mg by mouth every morning. Reported on 09/06/2015    Historical Provider, MD  vitamin E 400 UNIT capsule Take 400 Units by mouth daily. Reported on 09/06/2015    Historical Provider, MD     Family History  Problem Relation Age of  Onset  . Diabetes Mother   . Cholelithiasis Mother   . Hypertension Sister   . Diabetes Sister   . Arthritis Brother     Social History   Social History  . Marital Status: Married    Spouse Name: N/A  . Number of Children: N/A  . Years of Education: N/A   Social History Main Topics  . Smoking status: Never Smoker   . Smokeless tobacco: Never Used  . Alcohol Use: No  . Drug  Use: No  . Sexual Activity: Not on file   Other Topics Concern  . 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    ECOG Status: 1 - Symptomatic but completely ambulatory  Review of Systems: A 12 point ROS discussed and pertinent positives are indicated in the HPI above.  All other systems are negative.  Review of Systems  Constitutional: Negative for fever, chills and appetite change.  Respiratory: Negative.   Cardiovascular: Negative.   Gastrointestinal: Negative.     Vital Signs: BP 118/76 mmHg  Pulse 92  Temp(Src) 98.6 F (37 C) (Oral)  Resp 25  Ht _0  (1.676 m)  SpO2 98%  Physical Exam  Constitutional: She appears well-developed and well-nourished.  HENT:  Head: Normocephalic.  Cardiovascular: Normal rate and regular rhythm.   Pulmonary/Chest: Effort normal and breath sounds normal.  Psychiatric: She has a normal mood and affect. Her behavior is normal.  Nursing note reviewed.   Mallampati Score:     Imaging: No results found.  Labs:  CBC:  Recent Labs  09/13/15 0853 09/24/15 0908 10/01/15 0823 10/04/15 0913  WBC 39.3* 1.0* 1.6* 1.1*  HGB 13.4 12.6 13.3 12.3  HCT 40.3 38.0 39.2 35.9  PLT 237 93* 285 300    COAGS: No results for input(s): INR, APTT in the last 8760 hours.  BMP:  Recent Labs  09/06/15 0907 09/13/15 0853 09/24/15 0908 10/04/15 0913  NA 136 140 137 139  K 4.0 3.3* 4.8 3.6  CL 104 107 107 106  CO2 _1 GLUCOSE 95 98 97 102*  BUN _2 CALCIUM 8.7* 8.8* 8.9 9.1  CREATININE 0.61 0.70 0.61 0.58  GFRNONAA >60 >60 >60 >60  GFRAA >60 >60 >60 >60    LIVER FUNCTION TESTS:  Recent Labs  08/09/15 0911 09/06/15 0907 09/13/15 0853 10/04/15 0913  BILITOT 0.7 0.3 0.4 0.4  AST _3 ALT 14 13* 15 12*  ALKPHOS 84 84 127* 73  PROT 7.1 7.0 6.9 6.7  ALBUMIN 3.6 3.7 3.7 3.7    TUMOR MARKERS: No results for input(s):  AFPTM, CEA, CA199, CHROMGRNA in the last 8760 hours.  Assessment and Plan:  Michelle [name] is a 75 y.o. female with past medical history significant for rheumatoid arthritis, hyperlipidemia and mantel cell lymphoma.  Secondary to recurrent severe and prolonged neutropenia after each cycle of chemotherapy, request has been made for repeat CT-guided bone marrow biopsy and aspiration for further evaluation.  Risks and Benefits a CT-guided bone marrow biopsy and aspiration were discussed with the patient including, but not limited to bleeding, infection, damage to adjacent structures or low yield requiring additional tests.  All of the patient's questions were answered, patient is agreeable to proceed.  Consent signed and in chart.  A copy of this report was sent to the requesting provider  on this date.  Electronically Signed: Sandi Mariscal 10/08/2015, 8:15 AM   I spent a total of 10 Minutes in face to face in clinical consultation, greater than 50% of which was counseling/coordinating care for CT-guided bone marrow biopsy and aspiration

## 2015-10-08 NOTE — Procedures (Signed)
Technically successful CT guided bone marrow aspiration and biopsy of left iliac crest. EBL: None No immediate complications.    SignedSandi Mariscal [phone] 10/08/2015, 9:41 AM

## 2015-10-09 ENCOUNTER — Inpatient Hospital Stay: Payer: Medicare Other

## 2015-10-09 ENCOUNTER — Ambulatory Visit
Admission: RE | Admit: 2015-10-09 | Discharge: 2015-10-09 | Disposition: A | Discharge status: Home / Self Care | Payer: Medicare Other | Source: Ambulatory Visit | Attending: Internal Medicine | Admitting: Internal Medicine

## 2015-10-09 DIAGNOSIS — Z8249 Family history of ischemic heart disease and other diseases of the circulatory system: Secondary | ICD-10-CM | POA: Diagnosis not present

## 2015-10-09 DIAGNOSIS — C8310 Mantle cell lymphoma, unspecified site: Secondary | ICD-10-CM | POA: Diagnosis not present

## 2015-10-09 DIAGNOSIS — C8311 Mantle cell lymphoma, lymph nodes of head, face, and neck: Secondary | ICD-10-CM | POA: Diagnosis not present

## 2015-10-09 DIAGNOSIS — Z79899 Other long term (current) drug therapy: Secondary | ICD-10-CM | POA: Diagnosis not present

## 2015-10-09 DIAGNOSIS — R938 Abnormal findings on diagnostic imaging of other specified body structures: Secondary | ICD-10-CM | POA: Diagnosis not present

## 2015-10-09 DIAGNOSIS — D702 Other drug-induced agranulocytosis: Secondary | ICD-10-CM

## 2015-10-09 DIAGNOSIS — K219 Gastro-esophageal reflux disease without esophagitis: Secondary | ICD-10-CM | POA: Diagnosis not present

## 2015-10-09 DIAGNOSIS — E785 Hyperlipidemia, unspecified: Secondary | ICD-10-CM | POA: Diagnosis not present

## 2015-10-09 DIAGNOSIS — Z8261 Family history of arthritis: Secondary | ICD-10-CM | POA: Diagnosis not present

## 2015-10-09 DIAGNOSIS — R51 Headache: Secondary | ICD-10-CM | POA: Diagnosis not present

## 2015-10-09 DIAGNOSIS — Z833 Family history of diabetes mellitus: Secondary | ICD-10-CM | POA: Diagnosis not present

## 2015-10-09 DIAGNOSIS — Z9221 Personal history of antineoplastic chemotherapy: Secondary | ICD-10-CM | POA: Diagnosis not present

## 2015-10-09 DIAGNOSIS — D709 Neutropenia, unspecified: Secondary | ICD-10-CM | POA: Diagnosis not present

## 2015-10-09 DIAGNOSIS — M129 Arthropathy, unspecified: Secondary | ICD-10-CM | POA: Diagnosis not present

## 2015-10-09 DIAGNOSIS — Z9889 Other specified postprocedural states: Secondary | ICD-10-CM | POA: Diagnosis not present

## 2015-10-09 DIAGNOSIS — Z7689 Persons encountering health services in other specified circumstances: Secondary | ICD-10-CM | POA: Diagnosis not present

## 2015-10-09 DIAGNOSIS — C8313 Mantle cell lymphoma, intra-abdominal lymph nodes: Secondary | ICD-10-CM | POA: Diagnosis not present

## 2015-10-09 DIAGNOSIS — D509 Iron deficiency anemia, unspecified: Secondary | ICD-10-CM | POA: Diagnosis not present

## 2015-10-09 DIAGNOSIS — R591 Generalized enlarged lymph nodes: Secondary | ICD-10-CM | POA: Diagnosis not present

## 2015-10-09 DIAGNOSIS — M069 Rheumatoid arthritis, unspecified: Secondary | ICD-10-CM | POA: Diagnosis not present

## 2015-10-09 LAB — GLUCOSE, CAPILLARY: Glucose-Capillary: 92 mg/dL (ref 65–99)

## 2015-10-09 MED ORDER — FLUDEOXYGLUCOSE F - 18 (FDG) INJECTION
12.5100 | Freq: Once | INTRAVENOUS | Status: AC | PRN
  Administered 2015-10-09: 12.51 via INTRAVENOUS

## 2015-10-09 MED ORDER — TBO-FILGRASTIM 480 MCG/0.8ML ~~LOC~~ SOSY
480.0000 ug | PREFILLED_SYRINGE | Freq: Once | SUBCUTANEOUS | Status: AC
  Administered 2015-10-09: 480 ug via SUBCUTANEOUS
  Filled 2015-10-09: qty 0.8

## 2015-10-10 ENCOUNTER — Inpatient Hospital Stay: Payer: Medicare Other

## 2015-10-10 DIAGNOSIS — D702 Other drug-induced agranulocytosis: Secondary | ICD-10-CM

## 2015-10-10 DIAGNOSIS — C8310 Mantle cell lymphoma, unspecified site: Secondary | ICD-10-CM | POA: Diagnosis not present

## 2015-10-10 DIAGNOSIS — M129 Arthropathy, unspecified: Secondary | ICD-10-CM | POA: Diagnosis not present

## 2015-10-10 DIAGNOSIS — E785 Hyperlipidemia, unspecified: Secondary | ICD-10-CM | POA: Diagnosis not present

## 2015-10-10 DIAGNOSIS — R591 Generalized enlarged lymph nodes: Secondary | ICD-10-CM | POA: Diagnosis not present

## 2015-10-10 DIAGNOSIS — Z79899 Other long term (current) drug therapy: Secondary | ICD-10-CM | POA: Diagnosis not present

## 2015-10-10 DIAGNOSIS — M069 Rheumatoid arthritis, unspecified: Secondary | ICD-10-CM | POA: Diagnosis not present

## 2015-10-10 DIAGNOSIS — K219 Gastro-esophageal reflux disease without esophagitis: Secondary | ICD-10-CM | POA: Diagnosis not present

## 2015-10-10 DIAGNOSIS — D509 Iron deficiency anemia, unspecified: Secondary | ICD-10-CM | POA: Diagnosis not present

## 2015-10-10 DIAGNOSIS — Z7689 Persons encountering health services in other specified circumstances: Secondary | ICD-10-CM | POA: Diagnosis not present

## 2015-10-10 DIAGNOSIS — Z9221 Personal history of antineoplastic chemotherapy: Secondary | ICD-10-CM | POA: Diagnosis not present

## 2015-10-10 DIAGNOSIS — C8311 Mantle cell lymphoma, lymph nodes of head, face, and neck: Secondary | ICD-10-CM

## 2015-10-10 DIAGNOSIS — D709 Neutropenia, unspecified: Secondary | ICD-10-CM | POA: Diagnosis not present

## 2015-10-10 LAB — CBC WITH DIFFERENTIAL/PLATELET
Band Neutrophils: 6 %
Basophils Absolute: 0.1 10*3/uL (ref 0–0.1)
Basophils Relative: 2 %
Blasts: 0 %
Eosinophils Absolute: 0 10*3/uL (ref 0–0.7)
Eosinophils Relative: 1 %
HCT: 37.9 % (ref 35.0–47.0)
Hemoglobin: 12.9 g/dL (ref 12.0–16.0)
Lymphocytes Relative: 17 %
Lymphs Abs: 0.7 10*3/uL — ABNORMAL LOW (ref 1.0–3.6)
MCH: 29.3 pg (ref 26.0–34.0)
MCHC: 33.9 g/dL (ref 32.0–36.0)
MCV: 86.4 fL (ref 80.0–100.0)
Metamyelocytes Relative: 3 %
Monocytes Absolute: 2.7 10*3/uL — ABNORMAL HIGH (ref 0.2–0.9)
Monocytes Relative: 65 %
Myelocytes: 1 %
Neutro Abs: 0.6 10*3/uL — ABNORMAL LOW (ref 1.4–6.5)
Neutrophils Relative %: 5 %
Platelets: 246 10*3/uL (ref 150–440)
Promyelocytes Absolute: 0 %
RBC: 4.39 MIL/uL (ref 3.80–5.20)
RDW: 16.9 % — ABNORMAL HIGH (ref 11.5–14.5)
WBC: 4.1 10*3/uL (ref 3.6–11.0)
nRBC: 3 /100 WBC — ABNORMAL HIGH

## 2015-10-10 MED ORDER — TBO-FILGRASTIM 480 MCG/0.8ML ~~LOC~~ SOSY
480.0000 ug | PREFILLED_SYRINGE | Freq: Once | SUBCUTANEOUS | Status: AC
  Administered 2015-10-10: 480 ug via SUBCUTANEOUS
  Filled 2015-10-10: qty 0.8

## 2015-10-11 ENCOUNTER — Inpatient Hospital Stay: Payer: Medicare Other

## 2015-10-11 VITALS — BP 114/74 | HR 94 | Temp 97.4°F | Resp 18

## 2015-10-11 DIAGNOSIS — E785 Hyperlipidemia, unspecified: Secondary | ICD-10-CM | POA: Diagnosis not present

## 2015-10-11 DIAGNOSIS — C8310 Mantle cell lymphoma, unspecified site: Secondary | ICD-10-CM | POA: Diagnosis not present

## 2015-10-11 DIAGNOSIS — M129 Arthropathy, unspecified: Secondary | ICD-10-CM | POA: Diagnosis not present

## 2015-10-11 DIAGNOSIS — D702 Other drug-induced agranulocytosis: Secondary | ICD-10-CM

## 2015-10-11 DIAGNOSIS — Z9221 Personal history of antineoplastic chemotherapy: Secondary | ICD-10-CM | POA: Diagnosis not present

## 2015-10-11 DIAGNOSIS — Z79899 Other long term (current) drug therapy: Secondary | ICD-10-CM | POA: Diagnosis not present

## 2015-10-11 DIAGNOSIS — R591 Generalized enlarged lymph nodes: Secondary | ICD-10-CM | POA: Diagnosis not present

## 2015-10-11 DIAGNOSIS — Z7689 Persons encountering health services in other specified circumstances: Secondary | ICD-10-CM | POA: Diagnosis not present

## 2015-10-11 DIAGNOSIS — D709 Neutropenia, unspecified: Secondary | ICD-10-CM | POA: Diagnosis not present

## 2015-10-11 DIAGNOSIS — K219 Gastro-esophageal reflux disease without esophagitis: Secondary | ICD-10-CM | POA: Diagnosis not present

## 2015-10-11 DIAGNOSIS — M069 Rheumatoid arthritis, unspecified: Secondary | ICD-10-CM | POA: Diagnosis not present

## 2015-10-11 DIAGNOSIS — D509 Iron deficiency anemia, unspecified: Secondary | ICD-10-CM | POA: Diagnosis not present

## 2015-10-11 MED ORDER — TBO-FILGRASTIM 480 MCG/0.8ML ~~LOC~~ SOSY
480.0000 ug | PREFILLED_SYRINGE | Freq: Once | SUBCUTANEOUS | Status: AC
  Administered 2015-10-11: 480 ug via SUBCUTANEOUS
  Filled 2015-10-11: qty 0.8

## 2015-10-12 ENCOUNTER — Inpatient Hospital Stay: Payer: Medicare Other

## 2015-10-12 ENCOUNTER — Inpatient Hospital Stay (HOSPITAL_BASED_OUTPATIENT_CLINIC_OR_DEPARTMENT_OTHER): Payer: Medicare Other | Admitting: Internal Medicine

## 2015-10-12 VITALS — BP 115/73 | HR 81 | Temp 97.8°F | Resp 18 | Wt 211.0 lb

## 2015-10-12 DIAGNOSIS — Z9221 Personal history of antineoplastic chemotherapy: Secondary | ICD-10-CM | POA: Diagnosis not present

## 2015-10-12 DIAGNOSIS — Z7689 Persons encountering health services in other specified circumstances: Secondary | ICD-10-CM | POA: Diagnosis not present

## 2015-10-12 DIAGNOSIS — M129 Arthropathy, unspecified: Secondary | ICD-10-CM

## 2015-10-12 DIAGNOSIS — C8311 Mantle cell lymphoma, lymph nodes of head, face, and neck: Secondary | ICD-10-CM

## 2015-10-12 DIAGNOSIS — R591 Generalized enlarged lymph nodes: Secondary | ICD-10-CM

## 2015-10-12 DIAGNOSIS — M069 Rheumatoid arthritis, unspecified: Secondary | ICD-10-CM | POA: Diagnosis not present

## 2015-10-12 DIAGNOSIS — E785 Hyperlipidemia, unspecified: Secondary | ICD-10-CM | POA: Diagnosis not present

## 2015-10-12 DIAGNOSIS — K219 Gastro-esophageal reflux disease without esophagitis: Secondary | ICD-10-CM | POA: Diagnosis not present

## 2015-10-12 DIAGNOSIS — D709 Neutropenia, unspecified: Secondary | ICD-10-CM | POA: Diagnosis not present

## 2015-10-12 DIAGNOSIS — D509 Iron deficiency anemia, unspecified: Secondary | ICD-10-CM | POA: Diagnosis not present

## 2015-10-12 DIAGNOSIS — Z79899 Other long term (current) drug therapy: Secondary | ICD-10-CM | POA: Diagnosis not present

## 2015-10-12 DIAGNOSIS — C8310 Mantle cell lymphoma, unspecified site: Secondary | ICD-10-CM

## 2015-10-12 DIAGNOSIS — D702 Other drug-induced agranulocytosis: Secondary | ICD-10-CM

## 2015-10-12 LAB — CBC WITH DIFFERENTIAL/PLATELET
Band Neutrophils: 12 %
Basophils Absolute: 0 10*3/uL (ref 0–0.1)
Basophils Relative: 0 %
Eosinophils Absolute: 0.3 10*3/uL (ref 0–0.7)
Eosinophils Relative: 3 %
HCT: 38.8 % (ref 35.0–47.0)
Hemoglobin: 13.1 g/dL (ref 12.0–16.0)
Lymphocytes Relative: 9 %
Lymphs Abs: 0.8 10*3/uL — ABNORMAL LOW (ref 1.0–3.6)
MCH: 29 pg (ref 26.0–34.0)
MCHC: 33.8 g/dL (ref 32.0–36.0)
MCV: 85.8 fL (ref 80.0–100.0)
Metamyelocytes Relative: 2 %
Monocytes Absolute: 3.3 10*3/uL — ABNORMAL HIGH (ref 0.2–0.9)
Monocytes Relative: 38 %
Myelocytes: 3 %
Neutro Abs: 4.2 10*3/uL (ref 1.4–6.5)
Neutrophils Relative %: 33 %
Platelets: 205 10*3/uL (ref 150–440)
RBC: 4.52 MIL/uL (ref 3.80–5.20)
RDW: 16.4 % — ABNORMAL HIGH (ref 11.5–14.5)
WBC: 8.6 10*3/uL (ref 3.6–11.0)

## 2015-10-12 NOTE — Progress Notes (Signed)
Patient here for CT and Bone Marrow results.

## 2015-10-12 NOTE — Progress Notes (Signed)
[facility] NOTE  Patient Care Team: Jackolyn Confer, MD as PCP - General (Internal Medicine) Jackolyn Confer, MD (Internal Medicine)  CHIEF COMPLAINTS/PURPOSE OF CONSULTATION: MANTLE CELL LYMPHOMA  # JAN 2017- MANTLE CELL LYMPHOMA STAGE IV; [R Breast LN [place] Core Bx-1.2cm LN/R Ax LN-Bx]; cyclin D Pos; Mitotic rate-LOW; MIPI score [5/intermediate risk]; BMBx-Positive for involvement. Feb 9th- START Benda-Ritux with neulasta; Prolonged neutropenia; DISCONT- Benda-Ritux;   # April 13 th 2017- START R-CHOP x1; severe/prolonged neutropenia; PET- CR; BMBx-Neg; Disc R-CHOP  # MAY 2017- Start Rituxan q 31M Main  # Rheumatoid Arthritis [on MXT]; March 2017-MUGA scan-51 %  HISTORY OF PRESENTING ILLNESS:  Michelle [name] 75 y.o.  female with with a  mantle cell lymphoma  Stage IV status post cycle #1 R CHOP chemotherapy approximately 3 weeks ago.  She feels good. Her appetite is good. She is gaining weight. Also noted to have improvement of her lymph nodes. No chills no fevers no cough.  Mild nausea postchemotherapy currently resolved. No fever no chills. No diarrhea constipation or tingling and numbness.   ROS: A complete 10 point review of system is done which is negative except mentioned above in history of present illness  MEDICAL HISTORY:  Past Medical History  Diagnosis Date  . Arthritis   . Headache(784.0)   . GERD (gastroesophageal reflux disease)   . Hyperlipidemia     hx  . Lymphoma, mantle cell ([place]) 06/01/2015    bx of lymph node in right breast  . Lymphadenopathy of head and neck 01/2015    see on Thyroid ultrasound  . Rheumatoid arthritis ([place])   . History of methotrexate therapy     SURGICAL HISTORY: Past Surgical History  Procedure Laterality Date  . Cardiac catheterization  09/2004    [facility]; EF 60%  . Cardiac catheterization  08/2004    [facility]  . Peripheral vascular catheterization N/A 07/04/2015    Procedure: [name] Insertion;  Surgeon:  [name], MD;  Location: [facility];  Service: Cardiovascular;  Laterality: N/A;    SOCIAL HISTORY: Social History   Social History  . Marital Status: Married    Spouse Name: N/A  . Number of Children: N/A  . Years of Education: N/A   Occupational History  . Not on file.   Social History Main Topics  . Smoking status: Never Smoker   . Smokeless tobacco: Never Used  . Alcohol Use: No  . Drug Use: No  . Sexual Activity: Not on file   Other Topics Concern  . 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    FAMILY HISTORY: Family History  Problem Relation Age of Onset  . Diabetes Mother   . Cholelithiasis Mother   . Hypertension Sister   . Diabetes Sister   . Arthritis Brother     ALLERGIES:  has No Known Allergies.  MEDICATIONS:  Current Outpatient Prescriptions  Medication Sig Dispense Refill  . Calcium Carbonate-Vit D-Min (CALTRATE 600+D PLUS) 600-800 MG-UNIT CHEW Chew 1 tablet by mouth 2 (two) times daily. 60 tablet 6  . fluticasone (FLONASE) 50 MCG/ACT nasal spray SPRAY TWICE IN EACH NOSTRIL ONCE DAILY  4  . levofloxacin (LEVAQUIN) 500 MG tablet Take 1 tablet (500 mg total) by mouth daily. 10 tablet 0  . lidocaine-prilocaine (EMLA) cream Apply 1 application topically as needed. 30 g  11  . Multiple Vitamins-Minerals (MULTIVITAMIN GUMMIES ADULT) CHEW Chew by mouth. Reported on 09/06/2015    . NON FORMULARY Reported on 09/06/2015    . omeprazole (PRILOSEC) 40 MG capsule TAKE ONE CAPSULE EVERY DAY 30 capsule 5  . ondansetron (ZOFRAN) 8 MG tablet Take 1 tablet (8 mg total) by mouth every 8 (eight) hours as needed for nausea or vomiting (start 3 days; after chemo). 40 tablet 0  . prochlorperazine (COMPAZINE) 10 MG tablet Take 1 tablet (10 mg total) by mouth every 6 (six) hours as needed for nausea or vomiting. 30 tablet 0  . traMADol (ULTRAM) 50 MG tablet Take 50 mg by mouth every  morning. Reported on 10/08/2015    . vitamin E 400 UNIT capsule Take 400 Units by mouth daily. Reported on 09/06/2015     No current facility-administered medications for this visit.      [name]  PHYSICAL EXAMINATION: ECOG PERFORMANCE STATUS: 0 - Asymptomatic  Filed Vitals:   10/12/15 1001  BP: 115/73  Pulse: 81  Temp: 97.8 F (36.6 C)  Resp: 18   Filed Weights   10/12/15 1001  Weight: 210 lb 15.7 oz (95.7 kg)    GENERAL: Well-nourished well-developed; Alert, no distress and comfortable. Accompanied by her family.  EYES: no pallor or icterus OROPHARYNX: no thrush or ulceration; good dentition  NECK: supple, no masses felt LYMPH:  lymphadenopathy in the cervical, axillary/inguinal lymph nodes resolved. LUNGS: clear to auscultation and No wheeze or crackles HEART/CVS: regular rate & rhythm and no murmurs; No lower extremity edema ABDOMEN: abdomen soft, non-tender and normal bowel sounds; positive for hepatomegaly.  Musculoskeletal:no cyanosis of digits and no clubbing;  PSYCH: alert & oriented x 3 with fluent speech NEURO: no focal motor/sensory deficits SKIN: no rashes or significant lesions  LABORATORY DATA:  I have reviewed the data as listed Lab Results  Component Value Date   WBC 8.6 10/12/2015   HGB 13.1 10/12/2015   HCT 38.8 10/12/2015   MCV 85.8 10/12/2015   PLT 205 10/12/2015    Recent Labs  09/06/15 0907 09/13/15 0853 09/24/15 0908 10/04/15 0913  NA 136 140 137 139  K 4.0 3.3* 4.8 3.6  CL 104 107 107 106  CO2 _0 GLUCOSE 95 98 97 102*  BUN _1 CREATININE 0.61 0.70 0.61 0.58  CALCIUM 8.7* 8.8* 8.9 9.1  GFRNONAA >60 >60 >60 >60  GFRAA >60 >60 >60 >60  PROT 7.0 6.9  --  6.7  ALBUMIN 3.7 3.7  --  3.7  AST 22 29  --  20  ALT 13* 15  --  12*  ALKPHOS 84 127*  --  73  BILITOT 0.3 0.4  --  0.4     ASSESSMENT & PLAN:   # MANTLE CELL LYMPHOMA-  Stage IV s/p 1 cycle of bendamustine rituximab; and one cycle of R CHOP  chemotherapy. Chemotherapy switched because of prolonged severe neutropenia.  # Patient's restaging PET scan-complete response; bone marrow biopsy no evidence of any residual lymphoma. Given severe and prolonged neutropenia post chemotherapy- recommend discontinuation of chemotherapy at this time. Recommend Rituxan maintenance every 2 months. If any concerns for recurrence; Revlimid could be added to the patient's regimen.   #Severe prolonged neutropenia post chemotherapy.  I discussed this with Dr. Meda Coffee- based on bone marrow no obvious concerns for myelodysplastic syndrome. Recommend SNP for MDS analysis.  # Today white count is 8.6 hemoglobin 13 platelets 205. Last Granix was  yesterday/May 11. I recommend holding the Granix at this time; checking blood counts twice weekly.   # Patient will follow-up with me in approximately 2 weeks to start rituximab single agent.   Cammie Sickle, MD 10/12/2015 10:20 AM

## 2015-10-15 ENCOUNTER — Other Ambulatory Visit: Payer: Self-pay | Admitting: Internal Medicine

## 2015-10-15 ENCOUNTER — Inpatient Hospital Stay: Payer: Medicare Other

## 2015-10-15 ENCOUNTER — Other Ambulatory Visit: Payer: Self-pay | Admitting: *Deleted

## 2015-10-15 DIAGNOSIS — D509 Iron deficiency anemia, unspecified: Secondary | ICD-10-CM | POA: Diagnosis not present

## 2015-10-15 DIAGNOSIS — C8310 Mantle cell lymphoma, unspecified site: Secondary | ICD-10-CM | POA: Diagnosis not present

## 2015-10-15 DIAGNOSIS — E785 Hyperlipidemia, unspecified: Secondary | ICD-10-CM | POA: Diagnosis not present

## 2015-10-15 DIAGNOSIS — Z7689 Persons encountering health services in other specified circumstances: Secondary | ICD-10-CM | POA: Diagnosis not present

## 2015-10-15 DIAGNOSIS — Z9221 Personal history of antineoplastic chemotherapy: Secondary | ICD-10-CM | POA: Diagnosis not present

## 2015-10-15 DIAGNOSIS — M069 Rheumatoid arthritis, unspecified: Secondary | ICD-10-CM | POA: Diagnosis not present

## 2015-10-15 DIAGNOSIS — Z79899 Other long term (current) drug therapy: Secondary | ICD-10-CM | POA: Diagnosis not present

## 2015-10-15 DIAGNOSIS — C8311 Mantle cell lymphoma, lymph nodes of head, face, and neck: Secondary | ICD-10-CM

## 2015-10-15 DIAGNOSIS — D709 Neutropenia, unspecified: Secondary | ICD-10-CM | POA: Diagnosis not present

## 2015-10-15 DIAGNOSIS — M129 Arthropathy, unspecified: Secondary | ICD-10-CM | POA: Diagnosis not present

## 2015-10-15 DIAGNOSIS — D702 Other drug-induced agranulocytosis: Secondary | ICD-10-CM

## 2015-10-15 DIAGNOSIS — K219 Gastro-esophageal reflux disease without esophagitis: Secondary | ICD-10-CM | POA: Diagnosis not present

## 2015-10-15 DIAGNOSIS — R591 Generalized enlarged lymph nodes: Secondary | ICD-10-CM | POA: Diagnosis not present

## 2015-10-15 LAB — CBC WITH DIFFERENTIAL/PLATELET
Basophils Absolute: 0 10*3/uL (ref 0–0.1)
Basophils Relative: 1 %
Eosinophils Absolute: 0.1 10*3/uL (ref 0–0.7)
Eosinophils Relative: 2 %
HCT: 39 % (ref 35.0–47.0)
Hemoglobin: 13 g/dL (ref 12.0–16.0)
Lymphocytes Relative: 23 %
Lymphs Abs: 0.7 10*3/uL — ABNORMAL LOW (ref 1.0–3.6)
MCH: 28.8 pg (ref 26.0–34.0)
MCHC: 33.3 g/dL (ref 32.0–36.0)
MCV: 86.3 fL (ref 80.0–100.0)
Monocytes Absolute: 0.8 10*3/uL (ref 0.2–0.9)
Monocytes Relative: 26 %
Neutro Abs: 1.4 10*3/uL (ref 1.4–6.5)
Neutrophils Relative %: 48 %
Platelets: 161 10*3/uL (ref 150–440)
RBC: 4.52 MIL/uL (ref 3.80–5.20)
RDW: 17 % — ABNORMAL HIGH (ref 11.5–14.5)
WBC: 3 10*3/uL — ABNORMAL LOW (ref 3.6–11.0)

## 2015-10-15 MED ORDER — TBO-FILGRASTIM 480 MCG/0.8ML ~~LOC~~ SOSY
480.0000 ug | PREFILLED_SYRINGE | Freq: Once | SUBCUTANEOUS | Status: AC
  Administered 2015-10-15: 480 ug via SUBCUTANEOUS
  Filled 2015-10-15: qty 0.8

## 2015-10-15 MED ORDER — LENALIDOMIDE 15 MG PO CAPS: 15.0000 mg | ORAL_CAPSULE | Freq: Every day | ORAL | Status: DC

## 2015-10-19 ENCOUNTER — Inpatient Hospital Stay: Payer: Medicare Other

## 2015-10-19 ENCOUNTER — Other Ambulatory Visit: Payer: Self-pay | Admitting: Internal Medicine

## 2015-10-19 DIAGNOSIS — Z9221 Personal history of antineoplastic chemotherapy: Secondary | ICD-10-CM | POA: Diagnosis not present

## 2015-10-19 DIAGNOSIS — M069 Rheumatoid arthritis, unspecified: Secondary | ICD-10-CM | POA: Diagnosis not present

## 2015-10-19 DIAGNOSIS — K219 Gastro-esophageal reflux disease without esophagitis: Secondary | ICD-10-CM | POA: Diagnosis not present

## 2015-10-19 DIAGNOSIS — E785 Hyperlipidemia, unspecified: Secondary | ICD-10-CM | POA: Diagnosis not present

## 2015-10-19 DIAGNOSIS — C8310 Mantle cell lymphoma, unspecified site: Secondary | ICD-10-CM | POA: Diagnosis not present

## 2015-10-19 DIAGNOSIS — C8311 Mantle cell lymphoma, lymph nodes of head, face, and neck: Secondary | ICD-10-CM

## 2015-10-19 DIAGNOSIS — R591 Generalized enlarged lymph nodes: Secondary | ICD-10-CM | POA: Diagnosis not present

## 2015-10-19 DIAGNOSIS — Z79899 Other long term (current) drug therapy: Secondary | ICD-10-CM | POA: Diagnosis not present

## 2015-10-19 DIAGNOSIS — Z7689 Persons encountering health services in other specified circumstances: Secondary | ICD-10-CM | POA: Diagnosis not present

## 2015-10-19 DIAGNOSIS — M129 Arthropathy, unspecified: Secondary | ICD-10-CM | POA: Diagnosis not present

## 2015-10-19 DIAGNOSIS — D509 Iron deficiency anemia, unspecified: Secondary | ICD-10-CM | POA: Diagnosis not present

## 2015-10-19 DIAGNOSIS — D709 Neutropenia, unspecified: Secondary | ICD-10-CM | POA: Diagnosis not present

## 2015-10-19 LAB — CBC WITH DIFFERENTIAL/PLATELET
Basophils Absolute: 0 10*3/uL (ref 0–0.1)
Basophils Relative: 1 %
Eosinophils Absolute: 0.1 10*3/uL (ref 0–0.7)
Eosinophils Relative: 4 %
HCT: 41.6 % (ref 35.0–47.0)
Hemoglobin: 13.8 g/dL (ref 12.0–16.0)
Lymphocytes Relative: 27 %
Lymphs Abs: 0.7 10*3/uL — ABNORMAL LOW (ref 1.0–3.6)
MCH: 28.6 pg (ref 26.0–34.0)
MCHC: 33.2 g/dL (ref 32.0–36.0)
MCV: 86.4 fL (ref 80.0–100.0)
Monocytes Absolute: 0.6 10*3/uL (ref 0.2–0.9)
Monocytes Relative: 22 %
Neutro Abs: 1.2 10*3/uL — ABNORMAL LOW (ref 1.4–6.5)
Neutrophils Relative %: 46 %
Platelets: 196 10*3/uL (ref 150–440)
RBC: 4.82 MIL/uL (ref 3.80–5.20)
RDW: 16.9 % — ABNORMAL HIGH (ref 11.5–14.5)
WBC: 2.6 10*3/uL — ABNORMAL LOW (ref 3.6–11.0)

## 2015-10-19 NOTE — Progress Notes (Signed)
ANC 1.2 today.  Per Dr. Burlene Arnt no granix today.

## 2015-10-22 ENCOUNTER — Inpatient Hospital Stay: Payer: Medicare Other

## 2015-10-22 DIAGNOSIS — C8310 Mantle cell lymphoma, unspecified site: Secondary | ICD-10-CM | POA: Diagnosis not present

## 2015-10-22 DIAGNOSIS — D509 Iron deficiency anemia, unspecified: Secondary | ICD-10-CM | POA: Diagnosis not present

## 2015-10-22 DIAGNOSIS — M069 Rheumatoid arthritis, unspecified: Secondary | ICD-10-CM | POA: Diagnosis not present

## 2015-10-22 DIAGNOSIS — M129 Arthropathy, unspecified: Secondary | ICD-10-CM | POA: Diagnosis not present

## 2015-10-22 DIAGNOSIS — Z7689 Persons encountering health services in other specified circumstances: Secondary | ICD-10-CM | POA: Diagnosis not present

## 2015-10-22 DIAGNOSIS — Z79899 Other long term (current) drug therapy: Secondary | ICD-10-CM | POA: Diagnosis not present

## 2015-10-22 DIAGNOSIS — E785 Hyperlipidemia, unspecified: Secondary | ICD-10-CM | POA: Diagnosis not present

## 2015-10-22 DIAGNOSIS — D709 Neutropenia, unspecified: Secondary | ICD-10-CM | POA: Diagnosis not present

## 2015-10-22 DIAGNOSIS — R591 Generalized enlarged lymph nodes: Secondary | ICD-10-CM | POA: Diagnosis not present

## 2015-10-22 DIAGNOSIS — Z9221 Personal history of antineoplastic chemotherapy: Secondary | ICD-10-CM | POA: Diagnosis not present

## 2015-10-22 DIAGNOSIS — C8311 Mantle cell lymphoma, lymph nodes of head, face, and neck: Secondary | ICD-10-CM

## 2015-10-22 DIAGNOSIS — K219 Gastro-esophageal reflux disease without esophagitis: Secondary | ICD-10-CM | POA: Diagnosis not present

## 2015-10-22 LAB — CBC WITH DIFFERENTIAL/PLATELET
Basophils Absolute: 0 10*3/uL (ref 0–0.1)
Basophils Relative: 0 %
Eosinophils Absolute: 0.1 10*3/uL (ref 0–0.7)
Eosinophils Relative: 3 %
HCT: 37.8 % (ref 35.0–47.0)
Hemoglobin: 12.6 g/dL (ref 12.0–16.0)
Lymphocytes Relative: 16 %
Lymphs Abs: 0.5 10*3/uL — ABNORMAL LOW (ref 1.0–3.6)
MCH: 28.8 pg (ref 26.0–34.0)
MCHC: 33.4 g/dL (ref 32.0–36.0)
MCV: 86 fL (ref 80.0–100.0)
Monocytes Absolute: 0.6 10*3/uL (ref 0.2–0.9)
Monocytes Relative: 19 %
Neutro Abs: 2 10*3/uL (ref 1.4–6.5)
Neutrophils Relative %: 62 %
Platelets: 229 10*3/uL (ref 150–440)
RBC: 4.39 MIL/uL (ref 3.80–5.20)
RDW: 16.8 % — ABNORMAL HIGH (ref 11.5–14.5)
WBC: 3.2 10*3/uL — ABNORMAL LOW (ref 3.6–11.0)

## 2015-10-26 ENCOUNTER — Inpatient Hospital Stay: Payer: Medicare Other

## 2015-10-26 ENCOUNTER — Inpatient Hospital Stay (HOSPITAL_BASED_OUTPATIENT_CLINIC_OR_DEPARTMENT_OTHER): Payer: Medicare Other | Admitting: Internal Medicine

## 2015-10-26 ENCOUNTER — Ambulatory Visit: Payer: Self-pay

## 2015-10-26 VITALS — BP 126/78 | HR 70 | Temp 96.5°F | Resp 18 | Wt 207.2 lb

## 2015-10-26 DIAGNOSIS — M069 Rheumatoid arthritis, unspecified: Secondary | ICD-10-CM

## 2015-10-26 DIAGNOSIS — Z7689 Persons encountering health services in other specified circumstances: Secondary | ICD-10-CM | POA: Diagnosis not present

## 2015-10-26 DIAGNOSIS — Z79899 Other long term (current) drug therapy: Secondary | ICD-10-CM

## 2015-10-26 DIAGNOSIS — R591 Generalized enlarged lymph nodes: Secondary | ICD-10-CM

## 2015-10-26 DIAGNOSIS — M129 Arthropathy, unspecified: Secondary | ICD-10-CM

## 2015-10-26 DIAGNOSIS — Z9221 Personal history of antineoplastic chemotherapy: Secondary | ICD-10-CM | POA: Diagnosis not present

## 2015-10-26 DIAGNOSIS — C8311 Mantle cell lymphoma, lymph nodes of head, face, and neck: Secondary | ICD-10-CM

## 2015-10-26 DIAGNOSIS — D702 Other drug-induced agranulocytosis: Secondary | ICD-10-CM

## 2015-10-26 DIAGNOSIS — D509 Iron deficiency anemia, unspecified: Secondary | ICD-10-CM | POA: Diagnosis not present

## 2015-10-26 DIAGNOSIS — E785 Hyperlipidemia, unspecified: Secondary | ICD-10-CM | POA: Diagnosis not present

## 2015-10-26 DIAGNOSIS — D709 Neutropenia, unspecified: Secondary | ICD-10-CM | POA: Diagnosis not present

## 2015-10-26 DIAGNOSIS — C8310 Mantle cell lymphoma, unspecified site: Secondary | ICD-10-CM | POA: Diagnosis not present

## 2015-10-26 DIAGNOSIS — K219 Gastro-esophageal reflux disease without esophagitis: Secondary | ICD-10-CM

## 2015-10-26 LAB — COMPREHENSIVE METABOLIC PANEL
ALT: 15 U/L (ref 14–54)
AST: 24 U/L (ref 15–41)
Albumin: 3.9 g/dL (ref 3.5–5.0)
Alkaline Phosphatase: 74 U/L (ref 38–126)
Anion gap: 5 (ref 5–15)
BUN: 22 mg/dL — ABNORMAL HIGH (ref 6–20)
CO2: 27 mmol/L (ref 22–32)
Calcium: 9 mg/dL (ref 8.9–10.3)
Chloride: 106 mmol/L (ref 101–111)
Creatinine, Ser: 0.65 mg/dL (ref 0.44–1.00)
GFR calc Af Amer: >60 mL/min (ref >60)
GFR calc non Af Amer: >60 mL/min (ref >60)
Glucose, Bld: 98 mg/dL (ref 65–99)
Potassium: 4.1 mmol/L (ref 3.5–5.1)
Sodium: 138 mmol/L (ref 135–145)
Total Bilirubin: 0.6 mg/dL (ref 0.3–1.2)
Total Protein: 6.8 g/dL (ref 6.5–8.1)

## 2015-10-26 LAB — CBC WITH DIFFERENTIAL/PLATELET
Basophils Absolute: 0 10*3/uL (ref 0–0.1)
Basophils Relative: 3 %
Eosinophils Absolute: 0.1 10*3/uL (ref 0–0.7)
Eosinophils Relative: 10 %
HCT: 37 % (ref 35.0–47.0)
Hemoglobin: 12.4 g/dL (ref 12.0–16.0)
Lymphocytes Relative: 37 %
Lymphs Abs: 0.4 10*3/uL — ABNORMAL LOW (ref 1.0–3.6)
MCH: 29 pg (ref 26.0–34.0)
MCHC: 33.4 g/dL (ref 32.0–36.0)
MCV: 86.9 fL (ref 80.0–100.0)
Monocytes Absolute: 0.4 10*3/uL (ref 0.2–0.9)
Monocytes Relative: 30 %
Neutro Abs: 0.2 10*3/uL — ABNORMAL LOW (ref 1.4–6.5)
Neutrophils Relative %: 20 %
Platelets: 234 10*3/uL (ref 150–440)
RBC: 4.26 MIL/uL (ref 3.80–5.20)
RDW: 17.2 % — ABNORMAL HIGH (ref 11.5–14.5)
WBC: 1.2 10*3/uL — CL (ref 3.6–11.0)

## 2015-10-26 MED ORDER — SODIUM CHLORIDE 0.9 % IV SOLN: 375.0000 mg/m2 | Freq: Once | INTRAVENOUS | Status: DC

## 2015-10-26 MED ORDER — HEPARIN SOD (PORK) LOCK FLUSH 100 UNIT/ML IV SOLN
500.0000 [IU] | Freq: Once | INTRAVENOUS | Status: AC | PRN
  Administered 2015-10-26: 500 [IU]
  Filled 2015-10-26: qty 5

## 2015-10-26 MED ORDER — ACETAMINOPHEN 325 MG PO TABS
650.0000 mg | ORAL_TABLET | Freq: Once | ORAL | Status: AC
  Administered 2015-10-26: 650 mg via ORAL
  Filled 2015-10-26: qty 2

## 2015-10-26 MED ORDER — SODIUM CHLORIDE 0.9 % IV SOLN
375.0000 mg/m2 | Freq: Once | INTRAVENOUS | Status: AC
  Administered 2015-10-26: 800 mg via INTRAVENOUS
  Filled 2015-10-26: qty 80

## 2015-10-26 MED ORDER — SODIUM CHLORIDE 0.9 % IV SOLN
Freq: Once | INTRAVENOUS | Status: AC
  Administered 2015-10-26: 10:00:00 via INTRAVENOUS
  Filled 2015-10-26: qty 1000

## 2015-10-26 MED ORDER — SODIUM CHLORIDE 0.9% FLUSH
10.0000 mL | INTRAVENOUS | Status: DC | PRN
  Administered 2015-10-26: 10 mL
  Filled 2015-10-26: qty 10

## 2015-10-26 MED ORDER — DIPHENHYDRAMINE HCL 25 MG PO CAPS
50.0000 mg | ORAL_CAPSULE | Freq: Once | ORAL | Status: AC
  Administered 2015-10-26: 50 mg via ORAL
  Filled 2015-10-26: qty 2

## 2015-10-26 MED ORDER — TBO-FILGRASTIM 480 MCG/0.8ML ~~LOC~~ SOSY
480.0000 ug | PREFILLED_SYRINGE | Freq: Once | SUBCUTANEOUS | Status: AC
  Administered 2015-10-26: 480 ug via SUBCUTANEOUS
  Filled 2015-10-26: qty 0.8

## 2015-10-26 NOTE — Progress Notes (Signed)
ANC 0.2. Will continue with rituxan treatment.

## 2015-10-26 NOTE — Progress Notes (Signed)
[place] NOTE  Patient Care Team: Jackolyn Confer, MD as PCP - General (Internal Medicine) Jackolyn Confer, MD (Internal Medicine)  CHIEF COMPLAINTS/PURPOSE OF CONSULTATION: MANTLE CELL LYMPHOMA  # JAN 2017- MANTLE CELL LYMPHOMA STAGE IV; [R Breast LN [place] Core Bx-1.2cm LN/R Ax LN-Bx]; cyclin D Pos; Mitotic rate-LOW; MIPI score [5/intermediate risk]; BMBx-Positive for involvement. Feb 9th- START Benda-Ritux with neulasta; Prolonged neutropenia; DISCONT- Benda-Ritux;   # April 13 th 2017- START R-CHOP x1; severe/prolonged neutropenia; PET- CR; BMBx-Neg; Disc R-CHOP  # 26th MAY 2017- Start Rituxan q 1M Main  # Rheumatoid Arthritis [on MXT]; March 2017-MUGA scan-51 %  HISTORY OF PRESENTING ILLNESS:  MARIELL [name] 75 y.o.  female with with a  mantle cell lymphoma  Stage IV status post cycle #1 R CHOP chemotherapy approximately 5 weeks ago. Further chemotherapy has been discontinued because of prolonged neutropenia.  Patient's appetite is good. Denies any new lumps or bumps. Energy levels are good. No fevers no chills no cough. No nausea no vomiting. No constipation. No diarrhea or tingling or numbness.  She wants to go back to work.  ROS: A complete 10 point review of system is done which is negative except mentioned above in history of present illness  MEDICAL HISTORY:  Past Medical History  Diagnosis Date  . Arthritis   . Headache(784.0)   . GERD (gastroesophageal reflux disease)   . Hyperlipidemia     hx  . Lymphoma, mantle cell ([place]) 06/01/2015    bx of lymph node in right breast  . Lymphadenopathy of head and neck 01/2015    see on Thyroid ultrasound  . Rheumatoid arthritis ([place])   . History of methotrexate therapy     SURGICAL HISTORY: Past Surgical History  Procedure Laterality Date  . Cardiac catheterization  09/2004    [facility]; EF 60%  . Cardiac catheterization  08/2004    [facility]  . Peripheral vascular catheterization N/A 07/04/2015   Procedure: [name] Insertion;  Surgeon: [name], MD;  Location: [facility];  Service: Cardiovascular;  Laterality: N/A;    SOCIAL HISTORY: Social History   Social History  . Marital Status: Married    Spouse Name: N/A  . Number of Children: N/A  . Years of Education: N/A   Occupational History  . Not on file.   Social History Main Topics  . Smoking status: Never Smoker   . Smokeless tobacco: Never Used  . Alcohol Use: No  . Drug Use: No  . Sexual Activity: Not on file   Other Topics Concern  . 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    FAMILY HISTORY: Family History  Problem Relation Age of Onset  . Diabetes Mother   . Cholelithiasis Mother   . Hypertension Sister   . Diabetes Sister   . Arthritis Brother     ALLERGIES:  has No Known Allergies.  MEDICATIONS:  Current Outpatient Prescriptions  Medication Sig Dispense Refill  . Calcium Carbonate-Vit D-Min (CALTRATE 600+D PLUS) 600-800 MG-UNIT CHEW Chew 1 tablet by mouth 2 (two) times daily. 60 tablet 6  . fluticasone (FLONASE) 50 MCG/ACT nasal spray SPRAY TWICE IN EACH NOSTRIL ONCE DAILY  4  . levofloxacin (LEVAQUIN) 500 MG tablet Take 1 tablet (500 mg total) by mouth daily. 10 tablet 0  . lidocaine-prilocaine (EMLA) cream Apply 1 application topically  as needed. 30 g 11  . Multiple Vitamins-Minerals (MULTIVITAMIN GUMMIES ADULT) CHEW Chew by mouth. Reported on 09/06/2015    . NON FORMULARY Reported on 09/06/2015    . omeprazole (PRILOSEC) 40 MG capsule TAKE ONE CAPSULE EVERY DAY 30 capsule 5  . ondansetron (ZOFRAN) 8 MG tablet Take 1 tablet (8 mg total) by mouth every 8 (eight) hours as needed for nausea or vomiting (start 3 days; after chemo). 40 tablet 0  . prochlorperazine (COMPAZINE) 10 MG tablet Take 1 tablet (10 mg total) by mouth every 6 (six) hours as needed for nausea or vomiting. 30 tablet 0  . traMADol  (ULTRAM) 50 MG tablet Take 50 mg by mouth every morning. Reported on 10/08/2015    . vitamin E 400 UNIT capsule Take 400 Units by mouth daily. Reported on 09/06/2015     No current facility-administered medications for this visit.   Facility-Administered Medications Ordered in Other Visits  Medication Dose Route Frequency Provider Last Rate Last Dose  . sodium chloride flush (NS) 0.9 % injection 10 mL  10 mL Intracatheter PRN Cammie Sickle, MD   10 mL at 10/26/15 0845      .  PHYSICAL EXAMINATION: ECOG PERFORMANCE STATUS: 0 - Asymptomatic  Filed Vitals:   10/26/15 0914  BP: 126/78  Pulse: 70  Temp: 96.5 F (35.8 C)  Resp: 18   Filed Weights   10/26/15 0914  Weight: 207 lb 3.7 oz (94 kg)    GENERAL: Well-nourished well-developed; Alert, no distress and comfortable. Accompanied by her family.  EYES: no pallor or icterus OROPHARYNX: no thrush or ulceration; good dentition  NECK: supple, no masses felt LYMPH:  lymphadenopathy in the cervical, axillary/inguinal lymph nodes resolved. LUNGS: clear to auscultation and No wheeze or crackles HEART/CVS: regular rate & rhythm and no murmurs; No lower extremity edema ABDOMEN: abdomen soft, non-tender and normal bowel sounds; positive for hepatomegaly.  Musculoskeletal:no cyanosis of digits and no clubbing;  PSYCH: alert & oriented x 3 with fluent speech NEURO: no focal motor/sensory deficits SKIN: no rashes or significant lesions  LABORATORY DATA:  I have reviewed the data as listed Lab Results  Component Value Date   WBC 1.2* 10/26/2015   HGB 12.4 10/26/2015   HCT 37.0 10/26/2015   MCV 86.9 10/26/2015   PLT 234 10/26/2015    Recent Labs  09/13/15 0853 09/24/15 0908 10/04/15 0913 10/26/15 0859  NA 140 137 139 138  K 3.3* 4.8 3.6 4.1  CL 107 107 106 106  CO2 _0 GLUCOSE 98 97 102* 98  BUN _1 22*  CREATININE 0.70 0.61 0.58 0.65  CALCIUM 8.8* 8.9 9.1 9.0  GFRNONAA >60 >60 >60 >60  GFRAA  >60 >60 >60 >60  PROT 6.9  --  6.7 6.8  ALBUMIN 3.7  --  3.7 3.9  AST 29  --  20 24  ALT 15  --  12* 15  ALKPHOS 127*  --  73 74  BILITOT 0.4  --  0.4 0.6     ASSESSMENT & PLAN:   # MANTLE CELL LYMPHOMA-  Stage IV s/p 1 cycle of bendamustine rituximab; and one cycle of R CHOP chemotherapy- Complete response on PET scan/bone marrow biopsy. Further chemotherapy discontinued because of severe prolonged neutropenia.  # Today white count is 1.2 absolute neutrophil count of 200 hemoglobin 12.4 platelets 234. Continue Granix on a weekly basis to keep the [place] greater than 1500. Check CBC on a weekly basis.  #  Given the significant neutropenia recommend this condition of chemotherapy- start patient on maintenance rituximab every 8 weeks. If patient's neutrophils- stay stable over the next many weeks- I would recommend a trial of lenalidomide in addition to maintenance rituximab.  # Going back to work- recommend holding off going back to work at this time while awaiting improvement of her absolute neutrophil count. His count stays steady then recommend going back to work next week.  # Patient follow-up with me in 4 weeks.   Cammie Sickle, MD 10/26/2015 12:52 PM

## 2015-10-26 NOTE — Progress Notes (Signed)
ANC: 200. MD, Dr. Rogue Bussing, already aware. Per Dr. Rogue Bussing order: proceed with Rituxan treatment today.

## 2015-10-26 NOTE — Progress Notes (Signed)
0915 am -- [name] called a ANC 0.2.   Read back process performed with lab tech.  Dr. Rogue Bussing informed at 930am. Read back process performed with md.

## 2015-11-02 ENCOUNTER — Inpatient Hospital Stay: Payer: Medicare Other | Attending: Internal Medicine

## 2015-11-02 ENCOUNTER — Inpatient Hospital Stay (HOSPITAL_BASED_OUTPATIENT_CLINIC_OR_DEPARTMENT_OTHER): Payer: Medicare Other | Admitting: Family Medicine

## 2015-11-02 ENCOUNTER — Inpatient Hospital Stay: Payer: Medicare Other

## 2015-11-02 ENCOUNTER — Telehealth: Payer: Self-pay | Admitting: *Deleted

## 2015-11-02 VITALS — BP 123/72 | HR 84 | Temp 98.4°F | Resp 18 | Wt 207.5 lb

## 2015-11-02 DIAGNOSIS — R609 Edema, unspecified: Secondary | ICD-10-CM | POA: Insufficient documentation

## 2015-11-02 DIAGNOSIS — C8311 Mantle cell lymphoma, lymph nodes of head, face, and neck: Secondary | ICD-10-CM

## 2015-11-02 DIAGNOSIS — R6884 Jaw pain: Secondary | ICD-10-CM | POA: Diagnosis not present

## 2015-11-02 DIAGNOSIS — D709 Neutropenia, unspecified: Secondary | ICD-10-CM | POA: Insufficient documentation

## 2015-11-02 DIAGNOSIS — D702 Other drug-induced agranulocytosis: Secondary | ICD-10-CM | POA: Insufficient documentation

## 2015-11-02 DIAGNOSIS — K219 Gastro-esophageal reflux disease without esophagitis: Secondary | ICD-10-CM

## 2015-11-02 DIAGNOSIS — M069 Rheumatoid arthritis, unspecified: Secondary | ICD-10-CM

## 2015-11-02 DIAGNOSIS — K047 Periapical abscess without sinus: Secondary | ICD-10-CM | POA: Diagnosis not present

## 2015-11-02 DIAGNOSIS — M129 Arthropathy, unspecified: Secondary | ICD-10-CM | POA: Diagnosis not present

## 2015-11-02 DIAGNOSIS — Z79899 Other long term (current) drug therapy: Secondary | ICD-10-CM | POA: Insufficient documentation

## 2015-11-02 DIAGNOSIS — Z7689 Persons encountering health services in other specified circumstances: Secondary | ICD-10-CM | POA: Diagnosis not present

## 2015-11-02 DIAGNOSIS — E785 Hyperlipidemia, unspecified: Secondary | ICD-10-CM

## 2015-11-02 DIAGNOSIS — T451X5S Adverse effect of antineoplastic and immunosuppressive drugs, sequela: Secondary | ICD-10-CM | POA: Insufficient documentation

## 2015-11-02 DIAGNOSIS — T360X5S Adverse effect of penicillins, sequela: Secondary | ICD-10-CM | POA: Diagnosis not present

## 2015-11-02 LAB — COMPREHENSIVE METABOLIC PANEL
ALT: 14 U/L (ref 14–54)
AST: 26 U/L (ref 15–41)
Albumin: 3.8 g/dL (ref 3.5–5.0)
Alkaline Phosphatase: 86 U/L (ref 38–126)
Anion gap: 8 (ref 5–15)
BUN: 19 mg/dL (ref 6–20)
CO2: 26 mmol/L (ref 22–32)
Calcium: 9 mg/dL (ref 8.9–10.3)
Chloride: 99 mmol/L — ABNORMAL LOW (ref 101–111)
Creatinine, Ser: 0.74 mg/dL (ref 0.44–1.00)
GFR calc Af Amer: >60 mL/min (ref >60)
GFR calc non Af Amer: >60 mL/min (ref >60)
Glucose, Bld: 109 mg/dL — ABNORMAL HIGH (ref 65–99)
Potassium: 4.3 mmol/L (ref 3.5–5.1)
Sodium: 133 mmol/L — ABNORMAL LOW (ref 135–145)
Total Bilirubin: 1 mg/dL (ref 0.3–1.2)
Total Protein: 7.5 g/dL (ref 6.5–8.1)

## 2015-11-02 LAB — CBC WITH DIFFERENTIAL/PLATELET
Basophils Absolute: 0 10*3/uL (ref 0–0.1)
Basophils Relative: 1 %
Eosinophils Absolute: 0 10*3/uL (ref 0–0.7)
Eosinophils Relative: 2 %
HCT: 37.5 % (ref 35.0–47.0)
Hemoglobin: 12.9 g/dL (ref 12.0–16.0)
Lymphocytes Relative: 36 %
Lymphs Abs: 0.4 10*3/uL — ABNORMAL LOW (ref 1.0–3.6)
MCH: 29.3 pg (ref 26.0–34.0)
MCHC: 34.3 g/dL (ref 32.0–36.0)
MCV: 85.2 fL (ref 80.0–100.0)
Monocytes Absolute: 0.8 10*3/uL (ref 0.2–0.9)
Monocytes Relative: 60 %
Neutro Abs: 0 10*3/uL — ABNORMAL LOW (ref 1.4–6.5)
Neutrophils Relative %: 1 %
Platelets: 222 10*3/uL (ref 150–440)
RBC: 4.4 MIL/uL (ref 3.80–5.20)
RDW: 16.9 % — ABNORMAL HIGH (ref 11.5–14.5)
WBC: 1.2 10*3/uL — CL (ref 3.6–11.0)

## 2015-11-02 MED ORDER — TBO-FILGRASTIM 480 MCG/0.8ML ~~LOC~~ SOSY
480.0000 ug | PREFILLED_SYRINGE | Freq: Once | SUBCUTANEOUS | Status: AC
  Administered 2015-11-02: 480 ug via SUBCUTANEOUS
  Filled 2015-11-02: qty 0.8

## 2015-11-02 MED ORDER — AMOXICILLIN-POT CLAVULANATE 875-125 MG PO TABS: 1.0000 | ORAL_TABLET | Freq: Two times a day (BID) | ORAL | Status: DC

## 2015-11-02 MED ORDER — TBO-FILGRASTIM 480 MCG/0.8ML ~~LOC~~ SOSY: 480.0000 ug | PREFILLED_SYRINGE | Freq: Every day | SUBCUTANEOUS | Status: DC

## 2015-11-02 MED ORDER — OXYCODONE HCL 5 MG PO TABS: 5.0000 mg | ORAL_TABLET | ORAL | Status: DC | PRN

## 2015-11-02 NOTE — Telephone Encounter (Signed)
Received call from [facility] in [facility] at 1014. Critical value report  Wbc 1.2. ANC 0.0. Read back process performed with lab tech.  Dr. Rogue Bussing made aware. Read back process performed with md--1020

## 2015-11-02 NOTE — Progress Notes (Signed)
[place]  [phone]  [phone]     DELANI KOHLI DOB: 1940/07/31  MR#: [id number]  CEY#:[id number]  Patient Care Team: Jackolyn Confer, MD as PCP - General (Internal Medicine) Jackolyn Confer, MD (Internal Medicine)  CHIEF COMPLAINT:  Chief Complaint  Patient presents with  . Lymphoma    INTERVAL HISTORY:  Patient is seen as an acute add on today regarding swelling in the right lower jaw as well as new acute pain involving teeth on the right side lower jaw. Patient denies any fever, chills. She reports having had this same experience on the left side of her jaw involving a tooth that had broken off and become infected. She remembers having to have it surgically removed however she does not remember who the dentist was. She has been able to eat some soft foods but she has not been able to chew. 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 As of 10/26/2015 she was started on Rituxan every 2 months for maintenance. She has also continued to have prolonged neutropenia requiring scheduled Granix injections.  REVIEW OF SYSTEMS:   Review of Systems  Constitutional: Negative for fever, chills, weight loss, malaise/fatigue and diaphoresis.  HENT:       Swelling in right lower jaw with pain in teeth  Eyes: Negative.   Respiratory: Negative for cough, hemoptysis, sputum production, shortness of breath and wheezing.   Cardiovascular: Negative for chest pain, palpitations, orthopnea, claudication, leg swelling and PND.  Gastrointestinal: Negative for heartburn, nausea, vomiting, abdominal pain, diarrhea, constipation, blood in stool and melena.  Genitourinary: Negative.   Musculoskeletal: Negative.   Skin: Negative.   Neurological: Negative for dizziness, tingling, focal weakness, seizures and weakness.  Endo/Heme/Allergies: Does not bruise/bleed easily.  Psychiatric/Behavioral: Negative for depression. The  patient is not nervous/anxious and does not have insomnia.     As per HPI. Otherwise, a complete review of systems is negatve.  ONCOLOGY HISTORY: # JAN 2017- MANTLE CELL LYMPHOMA STAGE IV; [R Breast LN [place] Core Bx-1.2cm LN/R Ax LN-Bx]; cyclin D Pos; Mitotic rate-LOW; MIPI score [5/intermediate risk]; BMBx-Positive for involvement. Feb 9th- START Benda-Ritux with neulasta; Prolonged neutropenia; DISCONT- Benda-Ritux;   # April 13 th 2017- START R-CHOP x1; severe/prolonged neutropenia; PET- CR; BMBx-Neg; Disc R-CHOP  # 26th MAY 2017- Start Rituxan q 81M Main  # Rheumatoid Arthritis [on MXT]; March 2017-MUGA scan-51 %  PAST MEDICAL HISTORY: Past Medical History  Diagnosis Date  . Arthritis   . Headache(784.0)   . GERD (gastroesophageal reflux disease)   . Hyperlipidemia     hx  . Lymphoma, mantle cell ([place]) 06/01/2015    bx of lymph node in right breast  . Lymphadenopathy of head and neck 01/2015    see on Thyroid ultrasound  . Rheumatoid arthritis ([place])   . History of methotrexate therapy     PAST SURGICAL HISTORY: Past Surgical History  Procedure Laterality Date  . Cardiac catheterization  09/2004    [facility]; EF 60%  . Cardiac catheter[facility]  08/2004    [facility]  . Peripheral vascular catheterization N/A 07/04/2015    Procedure: [name] Insertion;  Surgeon: [name], MD;  Location: [facility];  Service: Cardiovascular;  Laterality: N/A;    FAMILY HISTORY Family History  Problem Relation Age of Onset  . Diabetes Mother   . Cholelithiasis Mother   . Hypertension Sister   . Diabetes Sister   . Arthritis Brother  GYNECOLOGIC HISTORY:  No LMP recorded. Patient is postmenopausal.     ADVANCED DIRECTIVES:    HEALTH MAINTENANCE: Social History  Substance Use Topics  . Smoking status: Never Smoker   . Smokeless tobacco: Never Used  . Alcohol Use: No     No Known Allergies  Current Outpatient Prescriptions  Medication Sig Dispense Refill  . Calcium  Carbonate-Vit D-Min (CALTRATE 600+D PLUS) 600-800 MG-UNIT CHEW Chew 1 tablet by mouth 2 (two) times daily. 60 tablet 6  . fluticasone (FLONASE) 50 MCG/ACT nasal spray SPRAY TWICE IN EACH NOSTRIL ONCE DAILY  4  . lidocaine-prilocaine (EMLA) cream Apply 1 application topically as needed. 30 g 11  . Multiple Vitamins-Minerals (MULTIVITAMIN GUMMIES ADULT) CHEW Chew by mouth. Reported on 09/06/2015    . omeprazole (PRILOSEC) 40 MG capsule TAKE ONE CAPSULE EVERY DAY 30 capsule 5  . ondansetron (ZOFRAN) 8 MG tablet Take 1 tablet (8 mg total) by mouth every 8 (eight) hours as needed for nausea or vomiting (start 3 days; after chemo). 40 tablet 0  . prochlorperazine (COMPAZINE) 10 MG tablet Take 1 tablet (10 mg total) by mouth every 6 (six) hours as needed for nausea or vomiting. 30 tablet 0  . traMADol (ULTRAM) 50 MG tablet Take 50 mg by mouth every morning. Reported on 10/08/2015    . vitamin E 400 UNIT capsule Take 400 Units by mouth daily. Reported on 09/06/2015    . amoxicillin-clavulanate (AUGMENTIN) 875-125 MG tablet Take 1 tablet by mouth 2 (two) times daily. 28 tablet 0  . NON FORMULARY Reported on 09/06/2015    . oxyCODONE (OXY IR/ROXICODONE) 5 MG immediate release tablet Take 1 tablet (5 mg total) by mouth every 4 (four) hours as needed for severe pain. 30 tablet 0   No current facility-administered medications for this visit.    OBJECTIVE: BP 123/72 mmHg  Pulse 84  Temp(Src) 98.4 F (36.9 C) (Tympanic)  Resp 18  Wt 207 lb 7.3 oz (94.1 kg)   Body mass index is 33.5 kg/(m^2).    ECOG FS:1 - Symptomatic but completely ambulatory  General: Well-developed, well-nourished, no acute distress. Eyes: Pink conjunctiva, anicteric sclera. HEENT:  Right lower jaw/submandibular swelling. Gum is inflamed and tender to touch with tongue blade. Lungs: Clear to auscultation bilaterally. Heart: Regular rate and rhythm. No rubs, murmurs, or gallops. Musculoskeletal: No edema, cyanosis, or clubbing. Neuro:  Alert, answering all questions appropriately. Cranial nerves grossly intact. Skin: No rashes or petechiae noted. Psych: Normal affect. Lymphatics: No cervical, clavicular LAD.   LAB RESULTS:  Appointment on 11/02/2015  Component Date Value Ref Range Status  . Sodium 11/02/2015 133* 135 - 145 mmol/L Final  . Potassium 11/02/2015 4.3  3.5 - 5.1 mmol/L Final  . Chloride 11/02/2015 99* 101 - 111 mmol/L Final  . CO2 11/02/2015 26  22 - 32 mmol/L Final  . Glucose, Bld 11/02/2015 109* 65 - 99 mg/dL Final  . BUN 11/02/2015 19  6 - 20 mg/dL Final  . Creatinine, Ser 11/02/2015 0.74  0.44 - 1.00 mg/dL Final  . Calcium 11/02/2015 9.0  8.9 - 10.3 mg/dL Final  . Total Protein 11/02/2015 7.5  6.5 - 8.1 g/dL Final  . Albumin 11/02/2015 3.8  3.5 - 5.0 g/dL Final  . AST 11/02/2015 26  15 - 41 U/L Final  . ALT 11/02/2015 14  14 - 54 U/L Final  . Alkaline Phosphatase 11/02/2015 86  38 - 126 U/L Final  . Total Bilirubin 11/02/2015 1.0  0.3 - 1.2  mg/dL Final  . GFR calc non Af Amer 11/02/2015 >60  >60 mL/min Final  . GFR calc Af Amer 11/02/2015 >60  >60 mL/min Final   Comment: (NOTE) The eGFR has been calculated using the CKD EPI equation. This calculation has not been validated in all clinical situations. eGFR's persistently <60 mL/min signify possible Chronic Kidney Disease.   . Anion gap 11/02/2015 8  5 - 15 Final  . WBC 11/02/2015 1.2* 3.6 - 11.0 K/uL Final   Comment: RESULT REPEATED AND VERIFIED CANCER CENTER CRITICAL VALUE PROTOCOL   . RBC 11/02/2015 4.40  3.80 - 5.20 MIL/uL Final  . Hemoglobin 11/02/2015 12.9  12.0 - 16.0 g/dL Final  . HCT 11/02/2015 37.5  35.0 - 47.0 % Final  . MCV 11/02/2015 85.2  80.0 - 100.0 fL Final  . MCH 11/02/2015 29.3  26.0 - 34.0 pg Final  . MCHC 11/02/2015 34.3  32.0 - 36.0 g/dL Final  . RDW 11/02/2015 16.9* 11.5 - 14.5 % Final  . Platelets 11/02/2015 222  150 - 440 K/uL Final  . Neutrophils Relative % 11/02/2015 1%   Final  . Neutro Abs 11/02/2015 0.0* 1.4  - 6.5 K/uL Final   Comment: RESULT REPEATED AND VERIFIED CRITICAL RESULT CALLED TO, READ BACK BY AND VERIFIED WITH: NYO TO HEATHER JONES 1014 11/02/15   . Lymphocytes Relative 11/02/2015 36%   Final  . Lymphs Abs 11/02/2015 0.4* 1.0 - 3.6 K/uL Final  . Monocytes Relative 11/02/2015 60%   Final  . Monocytes Absolute 11/02/2015 0.8  0.2 - 0.9 K/uL Final  . Eosinophils Relative 11/02/2015 2%   Final  . Eosinophils Absolute 11/02/2015 0.0  0 - 0.7 K/uL Final  . Basophils Relative 11/02/2015 1%   Final  . Basophils Absolute 11/02/2015 0.0  0 - 0.1 K/uL Final    STUDIES: No results found.  ASSESSMENT:  Mantle cell lymphoma, stage IV. Right submandibular edema, likely related to abscess tooth.  PLAN:  1. Mantle cell lymphoma. Patient has not tolerated chemotherapy well due to severe prolonged neutropenia. Today her white count is 1.2 with an [place] of 0. All other lab work continues to be within normal limits. We will schedule patient   to continue with Granix on a daily basis until [place] is greater than 1500.  2. Right submandibular edema, related to abscess tooth. Patient does not currently have a dentist and has not had regular dental care in years. She has however had this in the past on the left side that was associated with a broken tooth and infection.  We will treat her with oral Augmentin for the next 14 days. We will also schedule CT scan of the soft tissue of the neck as soon as it is possible. Also instructed patient to contact her previous oral surgeon to see if he had any recommendations.   Patient will return to our clinic Monday through Friday of next week for continued Granix injections. She can be reevaluated by myself or Dr. Rogue Bussing the middle of next week.  Patient expressed understanding and was in agreement with this plan. She also understands that She can call clinic at any time with any questions, concerns, or complaints.   Dr. Rogue Bussing was available for consultation  and review of plan of care for this patient.   Evlyn Kanner, NP   11/02/2015 10:53 AM

## 2015-11-02 NOTE — Progress Notes (Signed)
Pt is having increased pain in right jaw due to toothache. Pt has not seen dentist. Pain started on Tuesday (5/30). Takes tramadol for pain but is not effective in controlling pain.

## 2015-11-05 ENCOUNTER — Inpatient Hospital Stay: Payer: Medicare Other

## 2015-11-05 VITALS — BP 119/82 | HR 71 | Temp 96.6°F | Resp 18

## 2015-11-05 DIAGNOSIS — T360X5S Adverse effect of penicillins, sequela: Secondary | ICD-10-CM | POA: Diagnosis not present

## 2015-11-05 DIAGNOSIS — C8311 Mantle cell lymphoma, lymph nodes of head, face, and neck: Secondary | ICD-10-CM

## 2015-11-05 DIAGNOSIS — M069 Rheumatoid arthritis, unspecified: Secondary | ICD-10-CM | POA: Diagnosis not present

## 2015-11-05 DIAGNOSIS — D702 Other drug-induced agranulocytosis: Secondary | ICD-10-CM | POA: Diagnosis not present

## 2015-11-05 DIAGNOSIS — M129 Arthropathy, unspecified: Secondary | ICD-10-CM | POA: Diagnosis not present

## 2015-11-05 DIAGNOSIS — R6884 Jaw pain: Secondary | ICD-10-CM | POA: Diagnosis not present

## 2015-11-05 DIAGNOSIS — T451X5S Adverse effect of antineoplastic and immunosuppressive drugs, sequela: Secondary | ICD-10-CM | POA: Diagnosis not present

## 2015-11-05 DIAGNOSIS — D709 Neutropenia, unspecified: Secondary | ICD-10-CM | POA: Diagnosis not present

## 2015-11-05 DIAGNOSIS — Z79899 Other long term (current) drug therapy: Secondary | ICD-10-CM | POA: Diagnosis not present

## 2015-11-05 DIAGNOSIS — E785 Hyperlipidemia, unspecified: Secondary | ICD-10-CM | POA: Diagnosis not present

## 2015-11-05 DIAGNOSIS — K047 Periapical abscess without sinus: Secondary | ICD-10-CM | POA: Diagnosis not present

## 2015-11-05 DIAGNOSIS — Z7689 Persons encountering health services in other specified circumstances: Secondary | ICD-10-CM | POA: Diagnosis not present

## 2015-11-05 DIAGNOSIS — K219 Gastro-esophageal reflux disease without esophagitis: Secondary | ICD-10-CM | POA: Diagnosis not present

## 2015-11-05 DIAGNOSIS — R609 Edema, unspecified: Secondary | ICD-10-CM | POA: Diagnosis not present

## 2015-11-05 MED ORDER — TBO-FILGRASTIM 480 MCG/0.8ML ~~LOC~~ SOSY
480.0000 ug | PREFILLED_SYRINGE | Freq: Once | SUBCUTANEOUS | Status: AC
  Administered 2015-11-05: 480 ug via SUBCUTANEOUS
  Filled 2015-11-05: qty 0.8

## 2015-11-06 ENCOUNTER — Inpatient Hospital Stay: Payer: Medicare Other

## 2015-11-06 DIAGNOSIS — K219 Gastro-esophageal reflux disease without esophagitis: Secondary | ICD-10-CM | POA: Diagnosis not present

## 2015-11-06 DIAGNOSIS — D702 Other drug-induced agranulocytosis: Secondary | ICD-10-CM

## 2015-11-06 DIAGNOSIS — M069 Rheumatoid arthritis, unspecified: Secondary | ICD-10-CM | POA: Diagnosis not present

## 2015-11-06 DIAGNOSIS — E785 Hyperlipidemia, unspecified: Secondary | ICD-10-CM | POA: Diagnosis not present

## 2015-11-06 DIAGNOSIS — R609 Edema, unspecified: Secondary | ICD-10-CM | POA: Diagnosis not present

## 2015-11-06 DIAGNOSIS — K047 Periapical abscess without sinus: Secondary | ICD-10-CM | POA: Diagnosis not present

## 2015-11-06 DIAGNOSIS — Z79899 Other long term (current) drug therapy: Secondary | ICD-10-CM | POA: Diagnosis not present

## 2015-11-06 DIAGNOSIS — D709 Neutropenia, unspecified: Secondary | ICD-10-CM | POA: Diagnosis not present

## 2015-11-06 DIAGNOSIS — R6884 Jaw pain: Secondary | ICD-10-CM | POA: Diagnosis not present

## 2015-11-06 DIAGNOSIS — C8311 Mantle cell lymphoma, lymph nodes of head, face, and neck: Secondary | ICD-10-CM | POA: Diagnosis not present

## 2015-11-06 DIAGNOSIS — T360X5S Adverse effect of penicillins, sequela: Secondary | ICD-10-CM | POA: Diagnosis not present

## 2015-11-06 DIAGNOSIS — T451X5S Adverse effect of antineoplastic and immunosuppressive drugs, sequela: Secondary | ICD-10-CM | POA: Diagnosis not present

## 2015-11-06 DIAGNOSIS — Z7689 Persons encountering health services in other specified circumstances: Secondary | ICD-10-CM | POA: Diagnosis not present

## 2015-11-06 DIAGNOSIS — M129 Arthropathy, unspecified: Secondary | ICD-10-CM | POA: Diagnosis not present

## 2015-11-06 MED ORDER — TBO-FILGRASTIM 480 MCG/0.8ML ~~LOC~~ SOSY
480.0000 ug | PREFILLED_SYRINGE | Freq: Once | SUBCUTANEOUS | Status: AC
  Administered 2015-11-06: 480 ug via SUBCUTANEOUS
  Filled 2015-11-06: qty 0.8

## 2015-11-07 ENCOUNTER — Telehealth: Payer: Self-pay | Admitting: Internal Medicine

## 2015-11-07 ENCOUNTER — Ambulatory Visit: Payer: Self-pay

## 2015-11-07 ENCOUNTER — Other Ambulatory Visit: Payer: Self-pay | Admitting: Family Medicine

## 2015-11-07 ENCOUNTER — Encounter: Payer: Self-pay | Admitting: Family Medicine

## 2015-11-07 ENCOUNTER — Inpatient Hospital Stay (HOSPITAL_BASED_OUTPATIENT_CLINIC_OR_DEPARTMENT_OTHER): Payer: Medicare Other | Admitting: Family Medicine

## 2015-11-07 ENCOUNTER — Inpatient Hospital Stay: Payer: Medicare Other

## 2015-11-07 VITALS — BP 113/76 | HR 63 | Temp 97.8°F | Wt 201.7 lb

## 2015-11-07 DIAGNOSIS — K219 Gastro-esophageal reflux disease without esophagitis: Secondary | ICD-10-CM | POA: Diagnosis not present

## 2015-11-07 DIAGNOSIS — E669 Obesity, unspecified: Secondary | ICD-10-CM | POA: Insufficient documentation

## 2015-11-07 DIAGNOSIS — C8311 Mantle cell lymphoma, lymph nodes of head, face, and neck: Secondary | ICD-10-CM

## 2015-11-07 DIAGNOSIS — Z7689 Persons encountering health services in other specified circumstances: Secondary | ICD-10-CM | POA: Diagnosis not present

## 2015-11-07 DIAGNOSIS — T451X5S Adverse effect of antineoplastic and immunosuppressive drugs, sequela: Secondary | ICD-10-CM | POA: Diagnosis not present

## 2015-11-07 DIAGNOSIS — T360X5S Adverse effect of penicillins, sequela: Secondary | ICD-10-CM | POA: Diagnosis not present

## 2015-11-07 DIAGNOSIS — M129 Arthropathy, unspecified: Secondary | ICD-10-CM | POA: Diagnosis not present

## 2015-11-07 DIAGNOSIS — M069 Rheumatoid arthritis, unspecified: Secondary | ICD-10-CM | POA: Diagnosis not present

## 2015-11-07 DIAGNOSIS — Z79899 Other long term (current) drug therapy: Secondary | ICD-10-CM

## 2015-11-07 DIAGNOSIS — R6884 Jaw pain: Secondary | ICD-10-CM | POA: Diagnosis not present

## 2015-11-07 DIAGNOSIS — R609 Edema, unspecified: Secondary | ICD-10-CM | POA: Diagnosis not present

## 2015-11-07 DIAGNOSIS — D709 Neutropenia, unspecified: Secondary | ICD-10-CM

## 2015-11-07 DIAGNOSIS — E785 Hyperlipidemia, unspecified: Secondary | ICD-10-CM | POA: Diagnosis not present

## 2015-11-07 DIAGNOSIS — R739 Hyperglycemia, unspecified: Secondary | ICD-10-CM | POA: Insufficient documentation

## 2015-11-07 DIAGNOSIS — K047 Periapical abscess without sinus: Secondary | ICD-10-CM | POA: Diagnosis not present

## 2015-11-07 DIAGNOSIS — D702 Other drug-induced agranulocytosis: Secondary | ICD-10-CM

## 2015-11-07 DIAGNOSIS — E079 Disorder of thyroid, unspecified: Secondary | ICD-10-CM | POA: Insufficient documentation

## 2015-11-07 MED ORDER — TBO-FILGRASTIM 480 MCG/0.8ML ~~LOC~~ SOSY
480.0000 ug | PREFILLED_SYRINGE | Freq: Once | SUBCUTANEOUS | Status: AC
  Administered 2015-11-07: 480 ug via SUBCUTANEOUS
  Filled 2015-11-07: qty 0.8

## 2015-11-07 NOTE — Progress Notes (Signed)
[name]  [phone]  Fax:([phone]) [phone]     Michelle [name] DOB: 1941/05/14  MR#: [id number]  ELT#:[id number]  Patient Care Team: Jackolyn Confer, MD as PCP - General (Internal Medicine) Jackolyn Confer, MD (Internal Medicine)  CHIEF COMPLAINT:  Chief Complaint  Patient presents with  . Mantel Cell lymphoma    INTERVAL HISTORY:  Patient is seen In follow-up regarding swelling in the right lower jaw from last week. Patient reports overall great improvement over the weekend with use of Augmentin. She also continues to use baking soda and salt water rinses. Patient denies any fever, chills. She does continue to have some intermittent pain and is taking oxycodone for pain management. She has been able to eat some soft foods but she has not been able to chew. 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 As of 10/26/2015 she was started on Rituxan every 2 months for maintenance. She has also continued to have prolonged neutropenia requiring scheduled Granix injections.  REVIEW OF SYSTEMS:   Review of Systems  Constitutional: Negative for fever, chills, weight loss, malaise/fatigue and diaphoresis.  HENT:       Pain in the right lower jaw continues but is improved, swelling also improved  Eyes: Negative.   Respiratory: Negative for cough, hemoptysis, sputum production, shortness of breath and wheezing.   Cardiovascular: Negative for chest pain, palpitations, orthopnea, claudication, leg swelling and PND.  Gastrointestinal: Negative for heartburn, nausea, vomiting, abdominal pain, diarrhea, constipation, blood in stool and melena.  Genitourinary: Negative.   Musculoskeletal: Negative.   Skin: Negative.   Neurological: Negative for dizziness, tingling, focal weakness, seizures and weakness.  Endo/Heme/Allergies: Does not bruise/bleed easily.  Psychiatric/Behavioral: Negative for depression. The patient is not  nervous/anxious and does not have insomnia.     As per HPI. Otherwise, a complete review of systems is negatve.  ONCOLOGY HISTORY: # JAN 2017- MANTLE CELL LYMPHOMA STAGE IV; [R Breast LN [place] Core Bx-1.2cm LN/R Ax LN-Bx]; cyclin D Pos; Mitotic rate-LOW; MIPI score [5/intermediate risk]; BMBx-Positive for involvement. Feb 9th- START Benda-Ritux with neulasta; Prolonged neutropenia; DISCONT- Benda-Ritux;   # April 13 th 2017- START R-CHOP x1; severe/prolonged neutropenia; PET- CR; BMBx-Neg; Disc R-CHOP  # 26th MAY 2017- Start Rituxan q 7M Main  # Rheumatoid Arthritis [on MXT]; March 2017-MUGA scan-51 %  PAST MEDICAL HISTORY: Past Medical History  Diagnosis Date  . Arthritis   . Headache(784.0)   . GERD (gastroesophageal reflux disease)   . Hyperlipidemia     hx  . Lymphoma, mantle cell ([place]) 06/01/2015    bx of lymph node in right breast  . Lymphadenopathy of head and neck 01/2015    see on Thyroid ultrasound  . Rheumatoid arthritis ([place])   . History of methotrexate therapy     PAST SURGICAL HISTORY: Past Surgical History  Procedure Laterality Date  . Cardiac catheterization  09/2004    [facility]; EF 60%  . Cardiac catheterization  08/2004    [facility]  . Peripheral vascular catheterization N/A 07/04/2015    Procedure: [name] Insertion;  Surgeon: [name], MD;  Location: [facility];  Service: Cardiovascular;  Laterality: N/A;    FAMILY HISTORY Family History  Problem Relation Age of Onset  . Diabetes Mother   . Cholelithiasis Mother   . Hypertension Sister   . Diabetes Sister   . Arthritis Brother     GYNECOLOGIC HISTORY:  No LMP recorded. Patient is postmenopausal.  ADVANCED DIRECTIVES:    HEALTH MAINTENANCE: Social History  Substance Use Topics  . Smoking status: Never Smoker   . Smokeless tobacco: Never Used  . Alcohol Use: No     No Known Allergies  Current Outpatient Prescriptions  Medication Sig Dispense Refill  . acyclovir (ZOVIRAX) 400 MG  tablet     . amoxicillin-clavulanate (AUGMENTIN) 875-125 MG tablet Take 1 tablet by mouth 2 (two) times daily. 28 tablet 0  . Calcium Carbonate-Vit D-Min (CALTRATE 600+D PLUS) 600-800 MG-UNIT CHEW Chew 1 tablet by mouth 2 (two) times daily. 60 tablet 6  . fluticasone (FLONASE) 50 MCG/ACT nasal spray SPRAY TWICE IN EACH NOSTRIL ONCE DAILY  4  . lidocaine-prilocaine (EMLA) cream Apply 1 application topically as needed. 30 g 11  . Multiple Vitamins-Minerals (MULTIVITAMIN GUMMIES ADULT) CHEW Chew by mouth. Reported on 09/06/2015    . NON FORMULARY Reported on 09/06/2015    . omeprazole (PRILOSEC) 40 MG capsule TAKE ONE CAPSULE EVERY DAY 30 capsule 5  . ondansetron (ZOFRAN) 8 MG tablet Take 1 tablet (8 mg total) by mouth every 8 (eight) hours as needed for nausea or vomiting (start 3 days; after chemo). 40 tablet 0  . oxyCODONE (OXY IR/ROXICODONE) 5 MG immediate release tablet Take 1 tablet (5 mg total) by mouth every 4 (four) hours as needed for severe pain. 30 tablet 0  . prochlorperazine (COMPAZINE) 10 MG tablet Take 1 tablet (10 mg total) by mouth every 6 (six) hours as needed for nausea or vomiting. 30 tablet 0  . traMADol (ULTRAM) 50 MG tablet Take 50 mg by mouth every morning. Reported on 10/08/2015    . vitamin E 400 UNIT capsule Take 400 Units by mouth daily. Reported on 09/06/2015     No current facility-administered medications for this visit.    OBJECTIVE: BP 113/76 mmHg  Pulse 63  Temp(Src) 97.8 F (36.6 C) (Tympanic)  Wt 201 lb 11.5 oz (91.5 kg)   Body mass index is 32.57 kg/(m^2).    ECOG FS:0 - Asymptomatic  General: Well-developed, well-nourished, no acute distress. Eyes: Pink conjunctiva, anicteric sclera. HEENT:  Swelling of her right jaw has decreased, broken tooth is well below the gum line but inflammation has resolved. Lungs: Clear to auscultation bilaterally. Heart: Regular rate and rhythm. No rubs, murmurs, or gallops. Musculoskeletal: No edema, cyanosis, or clubbing. Neuro:  Alert, answering all questions appropriately. Cranial nerves grossly intact. Skin: No rashes or petechiae noted. Psych: Normal affect. Lymphatics: No cervical, clavicular LAD.   LAB RESULTS:  No visits with results within 3 Day(s) from this visit. Latest known visit with results is:  Appointment on 11/02/2015  Component Date Value Ref Range Status  . Sodium 11/02/2015 133* 135 - 145 mmol/L Final  . Potassium 11/02/2015 4.3  3.5 - 5.1 mmol/L Final  . Chloride 11/02/2015 99* 101 - 111 mmol/L Final  . CO2 11/02/2015 26  22 - 32 mmol/L Final  . Glucose, Bld 11/02/2015 109* 65 - 99 mg/dL Final  . BUN 11/02/2015 19  6 - 20 mg/dL Final  . Creatinine, Ser 11/02/2015 0.74  0.44 - 1.00 mg/dL Final  . Calcium 11/02/2015 9.0  8.9 - 10.3 mg/dL Final  . Total Protein 11/02/2015 7.5  6.5 - 8.1 g/dL Final  . Albumin 11/02/2015 3.8  3.5 - 5.0 g/dL Final  . AST 11/02/2015 26  15 - 41 U/L Final  . ALT 11/02/2015 14  14 - 54 U/L Final  . Alkaline Phosphatase 11/02/2015 86  38 -  126 U/L Final  . Total Bilirubin 11/02/2015 1.0  0.3 - 1.2 mg/dL Final  . GFR calc non Af Amer 11/02/2015 >60  >60 mL/min Final  . GFR calc Af Amer 11/02/2015 >60  >60 mL/min Final   Comment: (NOTE) The eGFR has been calculated using the CKD EPI equation. This calculation has not been validated in all clinical situations. eGFR's persistently <60 mL/min signify possible Chronic Kidney Disease.   . Anion gap 11/02/2015 8  5 - 15 Final  . WBC 11/02/2015 1.2* 3.6 - 11.0 K/uL Final   Comment: RESULT REPEATED AND VERIFIED CANCER CENTER CRITICAL VALUE PROTOCOL   . RBC 11/02/2015 4.40  3.80 - 5.20 MIL/uL Final  . Hemoglobin 11/02/2015 12.9  12.0 - 16.0 g/dL Final  . HCT 11/02/2015 37.5  35.0 - 47.0 % Final  . MCV 11/02/2015 85.2  80.0 - 100.0 fL Final  . MCH 11/02/2015 29.3  26.0 - 34.0 pg Final  . MCHC 11/02/2015 34.3  32.0 - 36.0 g/dL Final  . RDW 11/02/2015 16.9* 11.5 - 14.5 % Final  . Platelets 11/02/2015 222  150 - 440  K/uL Final  . Neutrophils Relative % 11/02/2015 1%   Final  . Neutro Abs 11/02/2015 0.0* 1.4 - 6.5 K/uL Final   Comment: RESULT REPEATED AND VERIFIED CRITICAL RESULT CALLED TO, READ BACK BY AND VERIFIED WITH: NYO TO HEATHER JONES 1014 11/02/15   . Lymphocytes Relative 11/02/2015 36%   Final  . Lymphs Abs 11/02/2015 0.4* 1.0 - 3.6 K/uL Final  . Monocytes Relative 11/02/2015 60%   Final  . Monocytes Absolute 11/02/2015 0.8  0.2 - 0.9 K/uL Final  . Eosinophils Relative 11/02/2015 2%   Final  . Eosinophils Absolute 11/02/2015 0.0  0 - 0.7 K/uL Final  . Basophils Relative 11/02/2015 1%   Final  . Basophils Absolute 11/02/2015 0.0  0 - 0.1 K/uL Final    STUDIES: No results found.  ASSESSMENT:  Mantle cell lymphoma, stage IV. Right submandibular edema, likely related to abscess tooth.  PLAN:  1. Mantle cell lymphoma. Patient has not tolerated chemotherapy well due to severe prolonged neutropenia. Today her white count is 1.2 with an [place] of 0. All other lab work continues to be within normal limits. We will schedule patient to continue with Granix on a daily basis until [place] is greater than 1500. She has her next scheduled follow-up appointment with Dr. Rogue Bussing on Friday, June 9. 2. Right submandibular edema, related to abscess tooth. Great improvement with use of Augmentin since Friday. We will treat her with oral Augmentin for the next 14 days. Patient has CT scan of the soft tissue of the neck scheduled for tomorrow and Thursday, June 8. Office phone number as well as address was given for Dr. Wallie Renshaw for patient to contact for appointment.  Patient expressed understanding and was in agreement with this plan. She also understands that She can call clinic at any time with any questions, concerns, or complaints.   Dr. Rogue Bussing was available for consultation and review of plan of care for this patient.   Evlyn Kanner, NP   11/07/2015 11:20 AM

## 2015-11-07 NOTE — Telephone Encounter (Signed)
Spoke to [place]- [phone]/office- recommends Panorex tomorrow; based on the results; then make decisions regarding CT scan that due at 1:30 in the afternoon. Patient to get Panorex in the morning tomorrow; she'll come here for Granix and then we'll decide on the CT scan.

## 2015-11-08 ENCOUNTER — Inpatient Hospital Stay: Payer: Medicare Other

## 2015-11-08 ENCOUNTER — Ambulatory Visit: Payer: Self-pay

## 2015-11-08 ENCOUNTER — Encounter: Payer: Self-pay | Admitting: *Deleted

## 2015-11-08 ENCOUNTER — Ambulatory Visit: Admission: RE | Admit: 2015-11-08 | Payer: Medicare Other | Source: Ambulatory Visit

## 2015-11-08 DIAGNOSIS — C8311 Mantle cell lymphoma, lymph nodes of head, face, and neck: Secondary | ICD-10-CM | POA: Diagnosis not present

## 2015-11-08 DIAGNOSIS — R6884 Jaw pain: Secondary | ICD-10-CM | POA: Diagnosis not present

## 2015-11-08 DIAGNOSIS — T451X5S Adverse effect of antineoplastic and immunosuppressive drugs, sequela: Secondary | ICD-10-CM | POA: Diagnosis not present

## 2015-11-08 DIAGNOSIS — M069 Rheumatoid arthritis, unspecified: Secondary | ICD-10-CM | POA: Diagnosis not present

## 2015-11-08 DIAGNOSIS — D702 Other drug-induced agranulocytosis: Secondary | ICD-10-CM | POA: Diagnosis not present

## 2015-11-08 DIAGNOSIS — E785 Hyperlipidemia, unspecified: Secondary | ICD-10-CM | POA: Diagnosis not present

## 2015-11-08 DIAGNOSIS — T360X5S Adverse effect of penicillins, sequela: Secondary | ICD-10-CM | POA: Diagnosis not present

## 2015-11-08 DIAGNOSIS — K219 Gastro-esophageal reflux disease without esophagitis: Secondary | ICD-10-CM | POA: Diagnosis not present

## 2015-11-08 DIAGNOSIS — Z7689 Persons encountering health services in other specified circumstances: Secondary | ICD-10-CM | POA: Diagnosis not present

## 2015-11-08 DIAGNOSIS — K047 Periapical abscess without sinus: Secondary | ICD-10-CM | POA: Diagnosis not present

## 2015-11-08 DIAGNOSIS — Z79899 Other long term (current) drug therapy: Secondary | ICD-10-CM | POA: Diagnosis not present

## 2015-11-08 DIAGNOSIS — M129 Arthropathy, unspecified: Secondary | ICD-10-CM | POA: Diagnosis not present

## 2015-11-08 DIAGNOSIS — D709 Neutropenia, unspecified: Secondary | ICD-10-CM | POA: Diagnosis not present

## 2015-11-08 DIAGNOSIS — R609 Edema, unspecified: Secondary | ICD-10-CM | POA: Diagnosis not present

## 2015-11-08 MED ORDER — TBO-FILGRASTIM 480 MCG/0.8ML ~~LOC~~ SOSY
480.0000 ug | PREFILLED_SYRINGE | Freq: Once | SUBCUTANEOUS | Status: AC
  Administered 2015-11-08: 480 ug via SUBCUTANEOUS
  Filled 2015-11-08: qty 0.8

## 2015-11-08 NOTE — Telephone Encounter (Signed)
RN and MD spoke with patient. She was instructed to go to Dr. Danise Edge office as scheduled for a jaw x-ray.  She stated that she "didn't know an jaw xray was scheduled. I thought was being scheduled for tooth extraction."  Dr. Rogue Bussing stated that tooth will not be extracted at this time.  Pt instructed to come to cancer center after this xray and proceed with granix inj. She was instructed to wait in cancer center to determine the jaw xray results. Pending jaw xray results-patient may or may not need ct scan of neck.

## 2015-11-08 NOTE — Progress Notes (Signed)
Contacted Dr. Francee Nodal office. Left vm for office to call Dr. Rogue Bussing asap. Pt had a jaw xray today. Pending results.  Ct scan was scheduled at 1pm today of soft tissue/neck. RN spoke with Dr. Rogue Bussing. V/o given to RN cnl this test today.  RN spoke with cancer center sch and sent msg to sch to cnl this apt. Pt in clinic and made aware of plan. I asked pt to keep her apts tom as already scheduled.

## 2015-11-09 ENCOUNTER — Inpatient Hospital Stay: Payer: Medicare Other

## 2015-11-09 ENCOUNTER — Telehealth: Payer: Self-pay | Admitting: *Deleted

## 2015-11-09 ENCOUNTER — Ambulatory Visit: Payer: Self-pay | Admitting: Internal Medicine

## 2015-11-09 ENCOUNTER — Inpatient Hospital Stay (HOSPITAL_BASED_OUTPATIENT_CLINIC_OR_DEPARTMENT_OTHER): Payer: Medicare Other | Admitting: Internal Medicine

## 2015-11-09 ENCOUNTER — Other Ambulatory Visit: Payer: Self-pay | Admitting: Internal Medicine

## 2015-11-09 VITALS — BP 126/86 | HR 73 | Temp 96.1°F | Resp 18 | Wt 211.0 lb

## 2015-11-09 DIAGNOSIS — Z7689 Persons encountering health services in other specified circumstances: Secondary | ICD-10-CM

## 2015-11-09 DIAGNOSIS — T451X5S Adverse effect of antineoplastic and immunosuppressive drugs, sequela: Secondary | ICD-10-CM | POA: Diagnosis not present

## 2015-11-09 DIAGNOSIS — R6884 Jaw pain: Secondary | ICD-10-CM | POA: Diagnosis not present

## 2015-11-09 DIAGNOSIS — D709 Neutropenia, unspecified: Secondary | ICD-10-CM | POA: Diagnosis not present

## 2015-11-09 DIAGNOSIS — C8311 Mantle cell lymphoma, lymph nodes of head, face, and neck: Secondary | ICD-10-CM

## 2015-11-09 DIAGNOSIS — M129 Arthropathy, unspecified: Secondary | ICD-10-CM | POA: Diagnosis not present

## 2015-11-09 DIAGNOSIS — K047 Periapical abscess without sinus: Secondary | ICD-10-CM

## 2015-11-09 DIAGNOSIS — D702 Other drug-induced agranulocytosis: Secondary | ICD-10-CM | POA: Diagnosis not present

## 2015-11-09 DIAGNOSIS — R609 Edema, unspecified: Secondary | ICD-10-CM | POA: Diagnosis not present

## 2015-11-09 DIAGNOSIS — T360X5S Adverse effect of penicillins, sequela: Secondary | ICD-10-CM | POA: Diagnosis not present

## 2015-11-09 DIAGNOSIS — E785 Hyperlipidemia, unspecified: Secondary | ICD-10-CM | POA: Diagnosis not present

## 2015-11-09 DIAGNOSIS — M069 Rheumatoid arthritis, unspecified: Secondary | ICD-10-CM | POA: Diagnosis not present

## 2015-11-09 DIAGNOSIS — K219 Gastro-esophageal reflux disease without esophagitis: Secondary | ICD-10-CM

## 2015-11-09 DIAGNOSIS — Z79899 Other long term (current) drug therapy: Secondary | ICD-10-CM | POA: Diagnosis not present

## 2015-11-09 LAB — CBC WITH DIFFERENTIAL/PLATELET
Basophils Absolute: 0 10*3/uL (ref 0–0.1)
Basophils Relative: 1 %
Eosinophils Absolute: 0.1 10*3/uL (ref 0–0.7)
Eosinophils Relative: 6 %
HCT: 37.9 % (ref 35.0–47.0)
Hemoglobin: 12.8 g/dL (ref 12.0–16.0)
Lymphocytes Relative: 28 %
Lymphs Abs: 0.7 10*3/uL — ABNORMAL LOW (ref 1.0–3.6)
MCH: 28.8 pg (ref 26.0–34.0)
MCHC: 33.7 g/dL (ref 32.0–36.0)
MCV: 85.6 fL (ref 80.0–100.0)
Monocytes Absolute: 1.2 10*3/uL — ABNORMAL HIGH (ref 0.2–0.9)
Monocytes Relative: 50 %
Neutro Abs: 0.4 10*3/uL — ABNORMAL LOW (ref 1.7–7.7)
Neutrophils Relative %: 15 %
Platelets: 213 10*3/uL (ref 150–440)
RBC: 4.43 MIL/uL (ref 3.80–5.20)
RDW: 17 % — ABNORMAL HIGH (ref 11.5–14.5)
WBC: 2.4 10*3/uL — ABNORMAL LOW (ref 3.6–11.0)

## 2015-11-09 MED ORDER — AMOXICILLIN-POT CLAVULANATE 875-125 MG PO TABS: 1.0000 | ORAL_TABLET | Freq: Two times a day (BID) | ORAL | Status: DC

## 2015-11-09 MED ORDER — TBO-FILGRASTIM 480 MCG/0.8ML ~~LOC~~ SOSY
480.0000 ug | PREFILLED_SYRINGE | Freq: Once | SUBCUTANEOUS | Status: AC
  Administered 2015-11-09: 480 ug via SUBCUTANEOUS
  Filled 2015-11-09: qty 0.8

## 2015-11-09 NOTE — Progress Notes (Signed)
[facility] NOTE  Patient Care Team: Jackolyn Confer, MD as PCP - General (Internal Medicine) Jackolyn Confer, MD (Internal Medicine)  CHIEF COMPLAINTS/PURPOSE OF CONSULTATION: MANTLE CELL LYMPHOMA  # JAN 2017- MANTLE CELL LYMPHOMA STAGE IV; [R Breast LN [place] Core Bx-1.2cm LN/R Ax LN-Bx]; cyclin D Pos; Mitotic rate-LOW; MIPI score [5/intermediate risk]; BMBx-Positive for involvement. Feb 9th- START Benda-Ritux with neulasta; Prolonged neutropenia; DISCONT- Benda-Ritux;   # April 13 th 2017- START R-CHOP x1; severe/prolonged neutropenia; PET- CR; BMBx-Neg; Disc R-CHOP  # 26th MAY 2017- Start Rituxan q 52M Main  # Rheumatoid Arthritis [on MXT]; March 2017-MUGA scan-51 %  HISTORY OF PRESENTING ILLNESS:  Michelle [name] 75 y.o.  female with with a  mantle cell lymphoma  Stage IV status post maintenance Rituxan on May 26- noted to right lower jaw swelling. Patient has been started on Augmentin twice a noted to have improvement.  Patient has been evaluated by dentistry; had a Panorex- needs dental extraction.  However patient's blood counts/ANC severe neutropenia/0 neutrophils approximately 5 days ago. Patient is currently back on Granix once a day.  No fever no chills. She wants to go back to work.  Patient's appetite is good. Denies any new lumps or bumps. Energy levels are good. no nausea no vomiting. No constipation. No diarrhea or tingling or numbness.   ROS: A complete 10 point review of system is done which is negative except mentioned above in history of present illness  MEDICAL HISTORY:  Past Medical History  Diagnosis Date  . Arthritis   . Headache(784.0)   . GERD (gastroesophageal reflux disease)   . Hyperlipidemia     hx  . Lymphoma, mantle cell ([place]) 06/01/2015    bx of lymph node in right breast  . Lymphadenopathy of head and neck 01/2015    see on Thyroid ultrasound  . Rheumatoid arthritis ([place])   . History of methotrexate therapy      SURGICAL HISTORY: Past Surgical History  Procedure Laterality Date  . Cardiac catheterization  09/2004    [facility]; EF 60%  . Cardiac catheterization  08/2004    [facility]  . Peripheral vascular catheterization N/A 07/04/2015    Procedure: [name] Insertion;  Surgeon: [name], MD;  Location: [facility];  Service: Cardiovascular;  Laterality: N/A;    SOCIAL HISTORY: Social History   Social History  . Marital Status: Married    Spouse Name: N/A  . Number of Children: N/A  . Years of Education: N/A   Occupational History  . Not on file.   Social History Main Topics  . Smoking status: Never Smoker   . Smokeless tobacco: Never Used  . Alcohol Use: No  . Drug Use: No  . Sexual Activity: Not on file   Other Topics Concern  . Not on file   Social History Narrative   ** Merged History Encounter **       Lives in [place]. Works as Psychiatrist.    FAMILY HISTORY: Family History  Problem Relation Age of Onset  . Diabetes Mother   . Cholelithiasis Mother   . Hypertension Sister   . Diabetes Sister   . Arthritis Brother     ALLERGIES:  has No Known Allergies.  MEDICATIONS:  Current Outpatient Prescriptions  Medication Sig Dispense Refill  . acyclovir (ZOVIRAX) 400 MG tablet     . amoxicillin-clavulanate (AUGMENTIN) 875-125 MG tablet Take 1 tablet by mouth 2 (two) times daily. 28 tablet  0  . Calcium Carbonate-Vit D-Min (CALTRATE 600+D PLUS) 600-800 MG-UNIT CHEW Chew 1 tablet by mouth 2 (two) times daily. 60 tablet 6  . fluticasone (FLONASE) 50 MCG/ACT nasal spray SPRAY TWICE IN EACH NOSTRIL ONCE DAILY  4  . lidocaine-prilocaine (EMLA) cream Apply 1 application topically as needed. 30 g 11  . Multiple Vitamins-Minerals (MULTIVITAMIN GUMMIES ADULT) CHEW Chew by mouth. Reported on 09/06/2015    . NON FORMULARY Reported on 09/06/2015    . omeprazole (PRILOSEC) 40 MG capsule TAKE ONE CAPSULE EVERY DAY 30 capsule 5  . ondansetron (ZOFRAN) 8 MG tablet Take 1 tablet  (8 mg total) by mouth every 8 (eight) hours as needed for nausea or vomiting (start 3 days; after chemo). 40 tablet 0  . oxyCODONE (OXY IR/ROXICODONE) 5 MG immediate release tablet Take 1 tablet (5 mg total) by mouth every 4 (four) hours as needed for severe pain. 30 tablet 0  . prochlorperazine (COMPAZINE) 10 MG tablet Take 1 tablet (10 mg total) by mouth every 6 (six) hours as needed for nausea or vomiting. 30 tablet 0  . traMADol (ULTRAM) 50 MG tablet Take 50 mg by mouth every morning. Reported on 10/08/2015    . vitamin E 400 UNIT capsule Take 400 Units by mouth daily. Reported on 09/06/2015     No current facility-administered medications for this visit.      [name]  PHYSICAL EXAMINATION: ECOG PERFORMANCE STATUS: 0 - Asymptomatic  Filed Vitals:   11/09/15 1059  BP: 126/86  Pulse: 73  Temp: 96.1 F (35.6 C)  Resp: 18   Filed Weights   11/09/15 1059  Weight: 210 lb 15.7 oz (95.7 kg)    GENERAL: Well-nourished well-developed; Alert, no distress and comfortable. Accompanied by her family.  EYES: no pallor or icterus OROPHARYNX: no thrush or ulceration; poor dentition  NECK: supple, no masses felt LYMPH:  lymphadenopathy in the cervical, axillary/inguinal lymph nodes resolved. LUNGS: clear to auscultation and No wheeze or crackles HEART/CVS: regular rate & rhythm and no murmurs; No lower extremity edema ABDOMEN: abdomen soft, non-tender and normal bowel sounds; positive for hepatomegaly.  Musculoskeletal:no cyanosis of digits and no clubbing;  PSYCH: alert & oriented x 3 with fluent speech NEURO: no focal motor/sensory deficits SKIN: no rashes or significant lesions  LABORATORY DATA:  I have reviewed the data as listed Lab Results  Component Value Date   WBC 2.4* 11/09/2015   HGB 12.8 11/09/2015   HCT 37.9 11/09/2015   MCV 85.6 11/09/2015   PLT 213 11/09/2015    Recent Labs  10/04/15 0913 10/26/15 0859 11/02/15 0958  NA 139 138 133*  K 3.6 4.1 4.3  CL 106  106 99*  CO2 '27 27 26  ' GLUCOSE 102* 98 109*  BUN 16 22* 19  CREATININE 0.58 0.65 0.74  CALCIUM 9.1 9.0 9.0  GFRNONAA >60 >60 >60  GFRAA >60 >60 >60  PROT 6.7 6.8 7.5  ALBUMIN 3.7 3.9 3.8  AST '20 24 26  ' ALT 12* 15 14  ALKPHOS 73 74 86  BILITOT 0.4 0.6 1.0     ASSESSMENT & PLAN:   # MANTLE CELL LYMPHOMA-  Stage IV s/p 1 cycle of bendamustine rituximab; and one cycle of R CHOP chemotherapy- Complete response on PET scan/bone marrow biopsy. Further chemotherapy discontinued because of severe prolonged neutropenia. Rituxan May 26 1. Given the severe prolonged neutropenia/dental infection recommend holding treatment at this time. We will reevaluate; question Ibrutinib as the next step.  # Severe drug-induced neutropenia-  recommend Granix once a full 5 days next week check CBC Monday Wednesday Friday. Based upon the blood counts we will inform Dr. Wilkie Aye re: timing of dental extraction. I very much appreciate Dr.Makhlouf in put. I have spoken to her re: the plan.   # Severe neutropenia with dental infection- need for dental extraction. Recommend antibiotics for 2 more weeks. New prescription given.   # Today white count is 1.2 absolute neutrophil count of 200 hemoglobin 12.4 platelets 234. Continue Granix on a weekly basis to keep the [place] greater than 1500. Check CBC on a weekly basis.  # Patient follow-up with me in 2 weeks.    Cammie Sickle, MD 11/09/2015 11:11 AM

## 2015-11-09 NOTE — Telephone Encounter (Signed)
Critical ANC - 0.4  MD notified. 

## 2015-11-12 ENCOUNTER — Inpatient Hospital Stay: Payer: Medicare Other

## 2015-11-12 DIAGNOSIS — D702 Other drug-induced agranulocytosis: Secondary | ICD-10-CM

## 2015-11-12 DIAGNOSIS — R609 Edema, unspecified: Secondary | ICD-10-CM | POA: Diagnosis not present

## 2015-11-12 DIAGNOSIS — C8311 Mantle cell lymphoma, lymph nodes of head, face, and neck: Secondary | ICD-10-CM | POA: Diagnosis not present

## 2015-11-12 DIAGNOSIS — Z7689 Persons encountering health services in other specified circumstances: Secondary | ICD-10-CM | POA: Diagnosis not present

## 2015-11-12 DIAGNOSIS — R6884 Jaw pain: Secondary | ICD-10-CM | POA: Diagnosis not present

## 2015-11-12 DIAGNOSIS — T360X5S Adverse effect of penicillins, sequela: Secondary | ICD-10-CM | POA: Diagnosis not present

## 2015-11-12 DIAGNOSIS — M069 Rheumatoid arthritis, unspecified: Secondary | ICD-10-CM | POA: Diagnosis not present

## 2015-11-12 DIAGNOSIS — M129 Arthropathy, unspecified: Secondary | ICD-10-CM | POA: Diagnosis not present

## 2015-11-12 DIAGNOSIS — T451X5S Adverse effect of antineoplastic and immunosuppressive drugs, sequela: Secondary | ICD-10-CM | POA: Diagnosis not present

## 2015-11-12 DIAGNOSIS — D709 Neutropenia, unspecified: Secondary | ICD-10-CM | POA: Diagnosis not present

## 2015-11-12 DIAGNOSIS — K219 Gastro-esophageal reflux disease without esophagitis: Secondary | ICD-10-CM | POA: Diagnosis not present

## 2015-11-12 DIAGNOSIS — E785 Hyperlipidemia, unspecified: Secondary | ICD-10-CM | POA: Diagnosis not present

## 2015-11-12 DIAGNOSIS — Z79899 Other long term (current) drug therapy: Secondary | ICD-10-CM | POA: Diagnosis not present

## 2015-11-12 DIAGNOSIS — K047 Periapical abscess without sinus: Secondary | ICD-10-CM | POA: Diagnosis not present

## 2015-11-12 LAB — CBC WITH DIFFERENTIAL/PLATELET
Basophils Absolute: 0 10*3/uL (ref 0–0.1)
Basophils Relative: 1 %
Eosinophils Absolute: 0.1 10*3/uL (ref 0–0.7)
Eosinophils Relative: 2 %
HCT: 38.8 % (ref 35.0–47.0)
Hemoglobin: 12.9 g/dL (ref 12.0–16.0)
Lymphocytes Relative: 26 %
Lymphs Abs: 0.8 10*3/uL — ABNORMAL LOW (ref 1.0–3.6)
MCH: 28.8 pg (ref 26.0–34.0)
MCHC: 33.3 g/dL (ref 32.0–36.0)
MCV: 86.5 fL (ref 80.0–100.0)
Monocytes Absolute: 0.9 10*3/uL (ref 0.2–0.9)
Monocytes Relative: 27 %
Neutro Abs: 1.5 10*3/uL (ref 1.4–6.5)
Neutrophils Relative %: 44 %
Platelets: 208 10*3/uL (ref 150–440)
RBC: 4.48 MIL/uL (ref 3.80–5.20)
RDW: 17.2 % — ABNORMAL HIGH (ref 11.5–14.5)
WBC: 3.3 10*3/uL — ABNORMAL LOW (ref 3.6–11.0)

## 2015-11-12 MED ORDER — TBO-FILGRASTIM 480 MCG/0.8ML ~~LOC~~ SOSY
480.0000 ug | PREFILLED_SYRINGE | Freq: Once | SUBCUTANEOUS | Status: AC
  Administered 2015-11-12: 480 ug via SUBCUTANEOUS
  Filled 2015-11-12: qty 0.8

## 2015-11-13 ENCOUNTER — Inpatient Hospital Stay: Payer: Medicare Other

## 2015-11-13 DIAGNOSIS — C8311 Mantle cell lymphoma, lymph nodes of head, face, and neck: Secondary | ICD-10-CM | POA: Diagnosis not present

## 2015-11-13 DIAGNOSIS — D709 Neutropenia, unspecified: Secondary | ICD-10-CM | POA: Diagnosis not present

## 2015-11-13 DIAGNOSIS — Z79899 Other long term (current) drug therapy: Secondary | ICD-10-CM | POA: Diagnosis not present

## 2015-11-13 DIAGNOSIS — Z7689 Persons encountering health services in other specified circumstances: Secondary | ICD-10-CM | POA: Diagnosis not present

## 2015-11-13 DIAGNOSIS — K219 Gastro-esophageal reflux disease without esophagitis: Secondary | ICD-10-CM | POA: Diagnosis not present

## 2015-11-13 DIAGNOSIS — M129 Arthropathy, unspecified: Secondary | ICD-10-CM | POA: Diagnosis not present

## 2015-11-13 DIAGNOSIS — T360X5S Adverse effect of penicillins, sequela: Secondary | ICD-10-CM | POA: Diagnosis not present

## 2015-11-13 DIAGNOSIS — E785 Hyperlipidemia, unspecified: Secondary | ICD-10-CM | POA: Diagnosis not present

## 2015-11-13 DIAGNOSIS — D702 Other drug-induced agranulocytosis: Secondary | ICD-10-CM

## 2015-11-13 DIAGNOSIS — R609 Edema, unspecified: Secondary | ICD-10-CM | POA: Diagnosis not present

## 2015-11-13 DIAGNOSIS — T451X5S Adverse effect of antineoplastic and immunosuppressive drugs, sequela: Secondary | ICD-10-CM | POA: Diagnosis not present

## 2015-11-13 DIAGNOSIS — M069 Rheumatoid arthritis, unspecified: Secondary | ICD-10-CM | POA: Diagnosis not present

## 2015-11-13 DIAGNOSIS — R6884 Jaw pain: Secondary | ICD-10-CM | POA: Diagnosis not present

## 2015-11-13 DIAGNOSIS — K047 Periapical abscess without sinus: Secondary | ICD-10-CM | POA: Diagnosis not present

## 2015-11-13 MED ORDER — TBO-FILGRASTIM 480 MCG/0.8ML ~~LOC~~ SOSY
480.0000 ug | PREFILLED_SYRINGE | Freq: Once | SUBCUTANEOUS | Status: AC
  Administered 2015-11-13: 480 ug via SUBCUTANEOUS
  Filled 2015-11-13: qty 0.8

## 2015-11-14 ENCOUNTER — Other Ambulatory Visit: Payer: Self-pay | Admitting: Internal Medicine

## 2015-11-14 ENCOUNTER — Inpatient Hospital Stay: Payer: Medicare Other

## 2015-11-14 ENCOUNTER — Other Ambulatory Visit: Payer: Self-pay | Admitting: *Deleted

## 2015-11-14 DIAGNOSIS — D702 Other drug-induced agranulocytosis: Secondary | ICD-10-CM | POA: Diagnosis not present

## 2015-11-14 DIAGNOSIS — C8311 Mantle cell lymphoma, lymph nodes of head, face, and neck: Secondary | ICD-10-CM

## 2015-11-14 DIAGNOSIS — M129 Arthropathy, unspecified: Secondary | ICD-10-CM | POA: Diagnosis not present

## 2015-11-14 DIAGNOSIS — M069 Rheumatoid arthritis, unspecified: Secondary | ICD-10-CM | POA: Diagnosis not present

## 2015-11-14 DIAGNOSIS — R6884 Jaw pain: Secondary | ICD-10-CM | POA: Diagnosis not present

## 2015-11-14 DIAGNOSIS — E785 Hyperlipidemia, unspecified: Secondary | ICD-10-CM | POA: Diagnosis not present

## 2015-11-14 DIAGNOSIS — T360X5S Adverse effect of penicillins, sequela: Secondary | ICD-10-CM | POA: Diagnosis not present

## 2015-11-14 DIAGNOSIS — D709 Neutropenia, unspecified: Secondary | ICD-10-CM | POA: Diagnosis not present

## 2015-11-14 DIAGNOSIS — Z79899 Other long term (current) drug therapy: Secondary | ICD-10-CM | POA: Diagnosis not present

## 2015-11-14 DIAGNOSIS — T451X5S Adverse effect of antineoplastic and immunosuppressive drugs, sequela: Secondary | ICD-10-CM | POA: Diagnosis not present

## 2015-11-14 DIAGNOSIS — Z7689 Persons encountering health services in other specified circumstances: Secondary | ICD-10-CM | POA: Diagnosis not present

## 2015-11-14 DIAGNOSIS — K219 Gastro-esophageal reflux disease without esophagitis: Secondary | ICD-10-CM | POA: Diagnosis not present

## 2015-11-14 DIAGNOSIS — R609 Edema, unspecified: Secondary | ICD-10-CM | POA: Diagnosis not present

## 2015-11-14 DIAGNOSIS — K047 Periapical abscess without sinus: Secondary | ICD-10-CM | POA: Diagnosis not present

## 2015-11-14 LAB — CBC WITH DIFFERENTIAL/PLATELET
Basophils Absolute: 0 10*3/uL (ref 0–0.1)
Basophils Relative: 0 %
Eosinophils Absolute: 0 10*3/uL (ref 0–0.7)
Eosinophils Relative: 0 %
HCT: 38.9 % (ref 35.0–47.0)
Hemoglobin: 13.1 g/dL (ref 12.0–16.0)
Lymphocytes Relative: 7 %
Lymphs Abs: 0.6 10*3/uL — ABNORMAL LOW (ref 1.0–3.6)
MCH: 28.9 pg (ref 26.0–34.0)
MCHC: 33.6 g/dL (ref 32.0–36.0)
MCV: 86.1 fL (ref 80.0–100.0)
Monocytes Absolute: 1.1 10*3/uL — ABNORMAL HIGH (ref 0.2–0.9)
Monocytes Relative: 12 %
Neutro Abs: 7.4 10*3/uL — ABNORMAL HIGH (ref 1.4–6.5)
Neutrophils Relative %: 81 %
Platelets: 189 10*3/uL (ref 150–440)
RBC: 4.52 MIL/uL (ref 3.80–5.20)
RDW: 17.4 % — ABNORMAL HIGH (ref 11.5–14.5)
WBC: 9.2 10*3/uL (ref 3.6–11.0)

## 2015-11-14 MED ORDER — TBO-FILGRASTIM 480 MCG/0.8ML ~~LOC~~ SOSY
480.0000 ug | PREFILLED_SYRINGE | Freq: Once | SUBCUTANEOUS | Status: DC
  Filled 2015-11-14: qty 0.8

## 2015-11-14 NOTE — Telephone Encounter (Signed)
md discontinued this medication. Pharmacy notified. Not needed to RF. Pt no longer on RCHOP

## 2015-11-14 NOTE — Telephone Encounter (Signed)
Per chart, this med was discontinued on March and is not on her med list. Please advise

## 2015-11-14 NOTE — Progress Notes (Signed)
Patient's labs have improved so discussed with Dr. Rogue Bussing the need for Granix injection today.  Verbal order:  She does not need injection today, cancel appointment for tomorrow, but keep the appointment Friday for labs/possible Granix inj.  Also scheduled for lab/possible Granix next Monday and Wednesday will keep next Friday appt with lab/MD/possible Granix.  Dr. Jacinto Reap will call and discuss labs with patient's dentist.

## 2015-11-15 ENCOUNTER — Inpatient Hospital Stay: Payer: Medicare Other

## 2015-11-16 ENCOUNTER — Inpatient Hospital Stay: Payer: Medicare Other

## 2015-11-16 DIAGNOSIS — K219 Gastro-esophageal reflux disease without esophagitis: Secondary | ICD-10-CM | POA: Diagnosis not present

## 2015-11-16 DIAGNOSIS — D709 Neutropenia, unspecified: Secondary | ICD-10-CM | POA: Diagnosis not present

## 2015-11-16 DIAGNOSIS — K047 Periapical abscess without sinus: Secondary | ICD-10-CM | POA: Diagnosis not present

## 2015-11-16 DIAGNOSIS — D702 Other drug-induced agranulocytosis: Secondary | ICD-10-CM

## 2015-11-16 DIAGNOSIS — T360X5S Adverse effect of penicillins, sequela: Secondary | ICD-10-CM | POA: Diagnosis not present

## 2015-11-16 DIAGNOSIS — T451X5S Adverse effect of antineoplastic and immunosuppressive drugs, sequela: Secondary | ICD-10-CM | POA: Diagnosis not present

## 2015-11-16 DIAGNOSIS — M129 Arthropathy, unspecified: Secondary | ICD-10-CM | POA: Diagnosis not present

## 2015-11-16 DIAGNOSIS — R609 Edema, unspecified: Secondary | ICD-10-CM | POA: Diagnosis not present

## 2015-11-16 DIAGNOSIS — M069 Rheumatoid arthritis, unspecified: Secondary | ICD-10-CM | POA: Diagnosis not present

## 2015-11-16 DIAGNOSIS — C8311 Mantle cell lymphoma, lymph nodes of head, face, and neck: Secondary | ICD-10-CM

## 2015-11-16 DIAGNOSIS — Z7689 Persons encountering health services in other specified circumstances: Secondary | ICD-10-CM | POA: Diagnosis not present

## 2015-11-16 DIAGNOSIS — Z79899 Other long term (current) drug therapy: Secondary | ICD-10-CM | POA: Diagnosis not present

## 2015-11-16 DIAGNOSIS — R6884 Jaw pain: Secondary | ICD-10-CM | POA: Diagnosis not present

## 2015-11-16 DIAGNOSIS — E785 Hyperlipidemia, unspecified: Secondary | ICD-10-CM | POA: Diagnosis not present

## 2015-11-16 LAB — CBC WITH DIFFERENTIAL/PLATELET
Basophils Absolute: 0 10*3/uL (ref 0–0.1)
Basophils Relative: 1 %
Eosinophils Absolute: 0 10*3/uL (ref 0–0.7)
Eosinophils Relative: 1 %
HCT: 41.6 % (ref 35.0–47.0)
Hemoglobin: 13.8 g/dL (ref 12.0–16.0)
Lymphocytes Relative: 47 %
Lymphs Abs: 1.3 10*3/uL (ref 1.0–3.6)
MCH: 28.7 pg (ref 26.0–34.0)
MCHC: 33.1 g/dL (ref 32.0–36.0)
MCV: 86.5 fL (ref 80.0–100.0)
Monocytes Absolute: 0.6 10*3/uL (ref 0.2–0.9)
Monocytes Relative: 21 %
Neutro Abs: 0.8 10*3/uL — ABNORMAL LOW (ref 1.4–6.5)
Neutrophils Relative %: 30 %
Platelets: 185 10*3/uL (ref 150–440)
RBC: 4.8 MIL/uL (ref 3.80–5.20)
RDW: 17.7 % — ABNORMAL HIGH (ref 11.5–14.5)
WBC: 2.8 10*3/uL — ABNORMAL LOW (ref 3.6–11.0)

## 2015-11-16 MED ORDER — TBO-FILGRASTIM 480 MCG/0.8ML ~~LOC~~ SOSY
480.0000 ug | PREFILLED_SYRINGE | Freq: Once | SUBCUTANEOUS | Status: AC
  Administered 2015-11-16: 480 ug via SUBCUTANEOUS
  Filled 2015-11-16: qty 0.8

## 2015-11-19 ENCOUNTER — Inpatient Hospital Stay: Payer: Medicare Other

## 2015-11-21 ENCOUNTER — Inpatient Hospital Stay: Payer: Medicare Other

## 2015-11-21 DIAGNOSIS — D709 Neutropenia, unspecified: Secondary | ICD-10-CM | POA: Diagnosis not present

## 2015-11-21 DIAGNOSIS — Z7689 Persons encountering health services in other specified circumstances: Secondary | ICD-10-CM | POA: Diagnosis not present

## 2015-11-21 DIAGNOSIS — D702 Other drug-induced agranulocytosis: Secondary | ICD-10-CM

## 2015-11-21 DIAGNOSIS — T360X5S Adverse effect of penicillins, sequela: Secondary | ICD-10-CM | POA: Diagnosis not present

## 2015-11-21 DIAGNOSIS — K047 Periapical abscess without sinus: Secondary | ICD-10-CM | POA: Diagnosis not present

## 2015-11-21 DIAGNOSIS — T451X5S Adverse effect of antineoplastic and immunosuppressive drugs, sequela: Secondary | ICD-10-CM | POA: Diagnosis not present

## 2015-11-21 DIAGNOSIS — Z79899 Other long term (current) drug therapy: Secondary | ICD-10-CM | POA: Diagnosis not present

## 2015-11-21 DIAGNOSIS — M069 Rheumatoid arthritis, unspecified: Secondary | ICD-10-CM | POA: Diagnosis not present

## 2015-11-21 DIAGNOSIS — R6884 Jaw pain: Secondary | ICD-10-CM | POA: Diagnosis not present

## 2015-11-21 DIAGNOSIS — C8311 Mantle cell lymphoma, lymph nodes of head, face, and neck: Secondary | ICD-10-CM | POA: Diagnosis not present

## 2015-11-21 DIAGNOSIS — R609 Edema, unspecified: Secondary | ICD-10-CM | POA: Diagnosis not present

## 2015-11-21 DIAGNOSIS — M129 Arthropathy, unspecified: Secondary | ICD-10-CM | POA: Diagnosis not present

## 2015-11-21 DIAGNOSIS — E785 Hyperlipidemia, unspecified: Secondary | ICD-10-CM | POA: Diagnosis not present

## 2015-11-21 DIAGNOSIS — K219 Gastro-esophageal reflux disease without esophagitis: Secondary | ICD-10-CM | POA: Diagnosis not present

## 2015-11-21 LAB — CBC WITH DIFFERENTIAL/PLATELET
Basophils Absolute: 0 10*3/uL (ref 0–0.1)
Basophils Relative: 1 %
Eosinophils Absolute: 0.1 10*3/uL (ref 0–0.7)
Eosinophils Relative: 3 %
HCT: 38.9 % (ref 35.0–47.0)
Hemoglobin: 12.9 g/dL (ref 12.0–16.0)
Lymphocytes Relative: 33 %
Lymphs Abs: 0.8 10*3/uL — ABNORMAL LOW (ref 1.0–3.6)
MCH: 28.5 pg (ref 26.0–34.0)
MCHC: 33.2 g/dL (ref 32.0–36.0)
MCV: 86 fL (ref 80.0–100.0)
Monocytes Absolute: 0.5 10*3/uL (ref 0.2–0.9)
Monocytes Relative: 19 %
Neutro Abs: 1.1 10*3/uL — ABNORMAL LOW (ref 1.4–6.5)
Neutrophils Relative %: 44 %
Platelets: 204 10*3/uL (ref 150–440)
RBC: 4.52 MIL/uL (ref 3.80–5.20)
RDW: 17.7 % — ABNORMAL HIGH (ref 11.5–14.5)
WBC: 2.6 10*3/uL — ABNORMAL LOW (ref 3.6–11.0)

## 2015-11-21 MED ORDER — TBO-FILGRASTIM 480 MCG/0.8ML ~~LOC~~ SOSY
480.0000 ug | PREFILLED_SYRINGE | Freq: Once | SUBCUTANEOUS | Status: AC
  Administered 2015-11-21: 480 ug via SUBCUTANEOUS
  Filled 2015-11-21: qty 0.8

## 2015-11-22 ENCOUNTER — Ambulatory Visit: Payer: Self-pay | Admitting: Family Medicine

## 2015-11-22 ENCOUNTER — Inpatient Hospital Stay: Payer: Medicare Other

## 2015-11-22 ENCOUNTER — Other Ambulatory Visit: Payer: Self-pay

## 2015-11-22 ENCOUNTER — Ambulatory Visit: Payer: Self-pay

## 2015-11-22 ENCOUNTER — Inpatient Hospital Stay (HOSPITAL_BASED_OUTPATIENT_CLINIC_OR_DEPARTMENT_OTHER): Payer: Medicare Other | Admitting: Family Medicine

## 2015-11-22 VITALS — BP 121/77 | HR 67 | Temp 98.2°F | Wt 207.5 lb

## 2015-11-22 DIAGNOSIS — M069 Rheumatoid arthritis, unspecified: Secondary | ICD-10-CM

## 2015-11-22 DIAGNOSIS — E785 Hyperlipidemia, unspecified: Secondary | ICD-10-CM

## 2015-11-22 DIAGNOSIS — D709 Neutropenia, unspecified: Secondary | ICD-10-CM | POA: Diagnosis not present

## 2015-11-22 DIAGNOSIS — K219 Gastro-esophageal reflux disease without esophagitis: Secondary | ICD-10-CM | POA: Diagnosis not present

## 2015-11-22 DIAGNOSIS — K047 Periapical abscess without sinus: Secondary | ICD-10-CM | POA: Diagnosis not present

## 2015-11-22 DIAGNOSIS — R609 Edema, unspecified: Secondary | ICD-10-CM | POA: Diagnosis not present

## 2015-11-22 DIAGNOSIS — Z79899 Other long term (current) drug therapy: Secondary | ICD-10-CM | POA: Diagnosis not present

## 2015-11-22 DIAGNOSIS — M1990 Unspecified osteoarthritis, unspecified site: Secondary | ICD-10-CM

## 2015-11-22 DIAGNOSIS — R6884 Jaw pain: Secondary | ICD-10-CM | POA: Diagnosis not present

## 2015-11-22 DIAGNOSIS — T360X5S Adverse effect of penicillins, sequela: Secondary | ICD-10-CM | POA: Diagnosis not present

## 2015-11-22 DIAGNOSIS — T451X5S Adverse effect of antineoplastic and immunosuppressive drugs, sequela: Secondary | ICD-10-CM

## 2015-11-22 DIAGNOSIS — C8311 Mantle cell lymphoma, lymph nodes of head, face, and neck: Secondary | ICD-10-CM

## 2015-11-22 DIAGNOSIS — Z9221 Personal history of antineoplastic chemotherapy: Secondary | ICD-10-CM

## 2015-11-22 DIAGNOSIS — Z7689 Persons encountering health services in other specified circumstances: Secondary | ICD-10-CM | POA: Diagnosis not present

## 2015-11-22 DIAGNOSIS — D702 Other drug-induced agranulocytosis: Secondary | ICD-10-CM

## 2015-11-22 DIAGNOSIS — M129 Arthropathy, unspecified: Secondary | ICD-10-CM | POA: Diagnosis not present

## 2015-11-22 LAB — CBC WITH DIFFERENTIAL/PLATELET
Basophils Absolute: 0 10*3/uL (ref 0–0.1)
Basophils Relative: 1 %
Eosinophils Absolute: 0.1 10*3/uL (ref 0–0.7)
Eosinophils Relative: 2 %
HCT: 39.1 % (ref 35.0–47.0)
Hemoglobin: 13 g/dL (ref 12.0–16.0)
Lymphocytes Relative: 13 %
Lymphs Abs: 0.6 10*3/uL — ABNORMAL LOW (ref 1.0–3.6)
MCH: 28.7 pg (ref 26.0–34.0)
MCHC: 33.3 g/dL (ref 32.0–36.0)
MCV: 86.3 fL (ref 80.0–100.0)
Monocytes Absolute: 0.6 10*3/uL (ref 0.2–0.9)
Monocytes Relative: 12 %
Neutro Abs: 3.4 10*3/uL (ref 1.4–6.5)
Neutrophils Relative %: 72 %
Platelets: 198 10*3/uL (ref 150–440)
RBC: 4.54 MIL/uL (ref 3.80–5.20)
RDW: 17.7 % — ABNORMAL HIGH (ref 11.5–14.5)
WBC: 4.7 10*3/uL (ref 3.6–11.0)

## 2015-11-22 LAB — COMPREHENSIVE METABOLIC PANEL
ALT: 13 U/L — ABNORMAL LOW (ref 14–54)
AST: 23 U/L (ref 15–41)
Albumin: 3.9 g/dL (ref 3.5–5.0)
Alkaline Phosphatase: 94 U/L (ref 38–126)
Anion gap: 7 (ref 5–15)
BUN: 11 mg/dL (ref 6–20)
CO2: 28 mmol/L (ref 22–32)
Calcium: 9.2 mg/dL (ref 8.9–10.3)
Chloride: 101 mmol/L (ref 101–111)
Creatinine, Ser: 0.66 mg/dL (ref 0.44–1.00)
GFR calc Af Amer: >60 mL/min (ref >60)
GFR calc non Af Amer: >60 mL/min (ref >60)
Glucose, Bld: 100 mg/dL — ABNORMAL HIGH (ref 65–99)
Potassium: 4.3 mmol/L (ref 3.5–5.1)
Sodium: 136 mmol/L (ref 135–145)
Total Bilirubin: 0.6 mg/dL (ref 0.3–1.2)
Total Protein: 7.1 g/dL (ref 6.5–8.1)

## 2015-11-22 NOTE — Progress Notes (Signed)
[facility]  [phone]  [phone]     Michelle [name] DOB: 02/18/1941  MR#: [id number]  SKA#:[id number]  Patient Care Team: Jackolyn Confer, MD as PCP - General (Internal Medicine) Jackolyn Confer, MD (Internal Medicine)  CHIEF COMPLAINT:  Chief Complaint  Patient presents with  . Mantle cell lymphoma of lymph nodes of neck ([place])    INTERVAL HISTORY:  Patient is seen In follow-up regarding Abscessed tooth. Patient reports overall great improvement following tooth extraction of 4 teeth. She is also completed additional 2 weeks of Augmentin oral antibiotics. Patient denies any fever, chills. Patient is currently being followed for mantle cell lymphoma, stage IV disease. She has also continued to have prolonged neutropenia, following 1 Rituxan treatment, requiring scheduled Granix injections.  REVIEW OF SYSTEMS:   Review of Systems  Constitutional: Negative for fever, chills, weight loss, malaise/fatigue and diaphoresis.  Eyes: Negative.   Respiratory: Negative for cough, hemoptysis, sputum production, shortness of breath and wheezing.   Cardiovascular: Negative for chest pain, palpitations, orthopnea, claudication, leg swelling and PND.  Gastrointestinal: Negative for heartburn, nausea, vomiting, abdominal pain, diarrhea, constipation, blood in stool and melena.  Genitourinary: Negative.   Musculoskeletal: Negative.   Skin: Negative.   Neurological: Negative for dizziness, tingling, focal weakness, seizures and weakness.  Endo/Heme/Allergies: Does not bruise/bleed easily.  Psychiatric/Behavioral: Negative for depression. The patient is not nervous/anxious and does not have insomnia.     As per HPI. Otherwise, a complete review of systems is negatve.  ONCOLOGY HISTORY: # JAN 2017- MANTLE CELL LYMPHOMA STAGE IV; [R Breast LN [place] Core Bx-1.2cm LN/R Ax LN-Bx]; cyclin D Pos; Mitotic rate-LOW; MIPI score [5/intermediate risk]; BMBx-Positive for  involvement. Feb 9th- START Benda-Ritux with neulasta; Prolonged neutropenia; DISCONT- Benda-Ritux;   # April 13 th 2017- START R-CHOP x1; severe/prolonged neutropenia; PET- CR; BMBx-Neg; Disc R-CHOP  # 26th MAY 2017- Start Rituxan q 15M Main  # Rheumatoid Arthritis [on MXT]; March 2017-MUGA scan-51 %  PAST MEDICAL HISTORY: Past Medical History  Diagnosis Date  . Arthritis   . Headache(784.0)   . GERD (gastroesophageal reflux disease)   . Hyperlipidemia     hx  . Lymphoma, mantle cell ([place]) 06/01/2015    bx of lymph node in right breast  . Lymphadenopathy of head and neck 01/2015    see on Thyroid ultrasound  . Rheumatoid arthritis ([place])   . History of methotrexate therapy     PAST SURGICAL HISTORY: Past Surgical History  Procedure Laterality Date  . Cardiac catheterization  09/2004    [facility]; EF 60%  . Cardiac catheterization  08/2004    [facility]  . Peripheral vascular catheterization N/A 07/04/2015    Procedure: [name] Insertion;  Surgeon: [name], MD;  Location: [facility];  Service: Cardiovascular;  Laterality: N/A;    FAMILY HISTORY Family History  Problem Relation Age of Onset  . Diabetes Mother   . Cholelithiasis Mother   . Hypertension Sister   . Diabetes Sister   . Arthritis Brother     GYNECOLOGIC HISTORY:  No LMP recorded. Patient is postmenopausal.     ADVANCED DIRECTIVES:    HEALTH MAINTENANCE: Social History  Substance Use Topics  . Smoking status: Never Smoker   . Smokeless tobacco: Never Used  . Alcohol Use: No     No Known Allergies  Current Outpatient Prescriptions  Medication Sig Dispense Refill  . acyclovir (ZOVIRAX) 400 MG tablet     . Calcium Carbonate-Vit  D-Min (CALTRATE 600+D PLUS) 600-800 MG-UNIT CHEW Chew 1 tablet by mouth 2 (two) times daily. 60 tablet 6  . fluticasone (FLONASE) 50 MCG/ACT nasal spray SPRAY TWICE IN EACH NOSTRIL ONCE DAILY  4  . lidocaine-prilocaine (EMLA) cream Apply 1 application topically as  needed. 30 g 11  . Multiple Vitamins-Minerals (MULTIVITAMIN GUMMIES ADULT) CHEW Chew by mouth. Reported on 09/06/2015    . NON FORMULARY Reported on 09/06/2015    . omeprazole (PRILOSEC) 40 MG capsule     . ondansetron (ZOFRAN) 8 MG tablet Take 1 tablet (8 mg total) by mouth every 8 (eight) hours as needed for nausea or vomiting (start 3 days; after chemo). 40 tablet 0  . oxyCODONE (OXY IR/ROXICODONE) 5 MG immediate release tablet Take 1 tablet (5 mg total) by mouth every 4 (four) hours as needed for severe pain. 30 tablet 0  . PREVIDENT 0.2 % SOLN     . prochlorperazine (COMPAZINE) 10 MG tablet Take 1 tablet (10 mg total) by mouth every 6 (six) hours as needed for nausea or vomiting. 30 tablet 0  . traMADol (ULTRAM) 50 MG tablet Take 50 mg by mouth every morning. Reported on 10/08/2015    . vitamin E 400 UNIT capsule Take 400 Units by mouth daily. Reported on 09/06/2015     No current facility-administered medications for this visit.   Facility-Administered Medications Ordered in Other Visits  Medication Dose Route Frequency Provider Last Rate Last Dose  . Tbo-Filgrastim (GRANIX) injection 480 mcg  480 mcg Subcutaneous Once Cammie Sickle, MD   480 mcg at 11/14/15 0840    OBJECTIVE: BP 121/77 mmHg  Pulse 67  Temp(Src) 98.2 F (36.8 C) (Oral)  Wt 207 lb 8 oz (94.121 kg)   Body mass index is 33.51 kg/(m^2).    ECOG FS:0 - Asymptomatic  General: Well-developed, well-nourished, no acute distress. Eyes: Pink conjunctiva, anicteric sclera. HEENT:  Issues with the right lower jaw and abscessed teeth have resolved. Lungs: Clear to auscultation bilaterally. Heart: Regular rate and rhythm. No rubs, murmurs, or gallops. Musculoskeletal: No edema, cyanosis, or clubbing. Neuro: Alert, answering all questions appropriately. Cranial nerves grossly intact. Skin: No rashes or petechiae noted. Psych: Normal affect. Lymphatics: No cervical, clavicular LAD.   LAB RESULTS:  Appointment on 11/22/2015   Component Date Value Ref Range Status  . Sodium 11/22/2015 136  135 - 145 mmol/L Final  . Potassium 11/22/2015 4.3  3.5 - 5.1 mmol/L Final  . Chloride 11/22/2015 101  101 - 111 mmol/L Final  . CO2 11/22/2015 28  22 - 32 mmol/L Final  . Glucose, Bld 11/22/2015 100* 65 - 99 mg/dL Final  . BUN 11/22/2015 11  6 - 20 mg/dL Final  . Creatinine, Ser 11/22/2015 0.66  0.44 - 1.00 mg/dL Final  . Calcium 11/22/2015 9.2  8.9 - 10.3 mg/dL Final  . Total Protein 11/22/2015 7.1  6.5 - 8.1 g/dL Final  . Albumin 11/22/2015 3.9  3.5 - 5.0 g/dL Final  . AST 11/22/2015 23  15 - 41 U/L Final  . ALT 11/22/2015 13* 14 - 54 U/L Final  . Alkaline Phosphatase 11/22/2015 94  38 - 126 U/L Final  . Total Bilirubin 11/22/2015 0.6  0.3 - 1.2 mg/dL Final  . GFR calc non Af Amer 11/22/2015 >60  >60 mL/min Final  . GFR calc Af Amer 11/22/2015 >60  >60 mL/min Final   Comment: (NOTE) The eGFR has been calculated using the CKD EPI equation. This calculation has not been  validated in all clinical situations. eGFR's persistently <60 mL/min signify possible Chronic Kidney Disease.   . Anion gap 11/22/2015 7  5 - 15 Final  . WBC 11/22/2015 4.7  3.6 - 11.0 K/uL Final  . RBC 11/22/2015 4.54  3.80 - 5.20 MIL/uL Final  . Hemoglobin 11/22/2015 13.0  12.0 - 16.0 g/dL Final  . HCT 11/22/2015 39.1  35.0 - 47.0 % Final  . MCV 11/22/2015 86.3  80.0 - 100.0 fL Final  . MCH 11/22/2015 28.7  26.0 - 34.0 pg Final  . MCHC 11/22/2015 33.3  32.0 - 36.0 g/dL Final  . RDW 11/22/2015 17.7* 11.5 - 14.5 % Final  . Platelets 11/22/2015 198  150 - 440 K/uL Final  . Neutrophils Relative % 11/22/2015 72   Final  . Neutro Abs 11/22/2015 3.4  1.4 - 6.5 K/uL Final  . Lymphocytes Relative 11/22/2015 13   Final  . Lymphs Abs 11/22/2015 0.6* 1.0 - 3.6 K/uL Final  . Monocytes Relative 11/22/2015 12   Final  . Monocytes Absolute 11/22/2015 0.6  0.2 - 0.9 K/uL Final  . Eosinophils Relative 11/22/2015 2   Final  . Eosinophils Absolute 11/22/2015 0.1   0 - 0.7 K/uL Final  . Basophils Relative 11/22/2015 1   Final  . Basophils Absolute 11/22/2015 0.0  0 - 0.1 K/uL Final  Appointment on 11/21/2015  Component Date Value Ref Range Status  . WBC 11/21/2015 2.6* 3.6 - 11.0 K/uL Final  . RBC 11/21/2015 4.52  3.80 - 5.20 MIL/uL Final  . Hemoglobin 11/21/2015 12.9  12.0 - 16.0 g/dL Final  . HCT 11/21/2015 38.9  35.0 - 47.0 % Final  . MCV 11/21/2015 86.0  80.0 - 100.0 fL Final  . MCH 11/21/2015 28.5  26.0 - 34.0 pg Final  . MCHC 11/21/2015 33.2  32.0 - 36.0 g/dL Final  . RDW 11/21/2015 17.7* 11.5 - 14.5 % Final  . Platelets 11/21/2015 204  150 - 440 K/uL Final  . Neutrophils Relative % 11/21/2015 44   Final  . Neutro Abs 11/21/2015 1.1* 1.4 - 6.5 K/uL Final  . Lymphocytes Relative 11/21/2015 33   Final  . Lymphs Abs 11/21/2015 0.8* 1.0 - 3.6 K/uL Final  . Monocytes Relative 11/21/2015 19   Final  . Monocytes Absolute 11/21/2015 0.5  0.2 - 0.9 K/uL Final  . Eosinophils Relative 11/21/2015 3   Final  . Eosinophils Absolute 11/21/2015 0.1  0 - 0.7 K/uL Final  . Basophils Relative 11/21/2015 1   Final  . Basophils Absolute 11/21/2015 0.0  0 - 0.1 K/uL Final    STUDIES: No results found.  ASSESSMENT:  Mantle cell lymphoma, stage IV. Prolonged neutropenia.  PLAN:  1. Mantle cell lymphoma. Patient has not tolerated chemotherapy well due to severe prolonged neutropenia. Today her white count is 4.7 with an ANC of 3.4. All other lab work continues to be within normal limits. She no longer requires daily Granix injections. We will continue to follow her lab work weekly and schedule Granix as needed. Patient also expresses desire to return to work. 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 She has been given a release form to return to work 3 days per week working 5 AM to 12 PM.  Patient will return weekly for lab evaluation and again in 3 weeks to see Dr.  Rogue Bussing.  Patient expressed understanding and was in agreement with this plan. She also understands that She  can call clinic at any time with any questions, concerns, or complaints.   Dr. Rogue Bussing was available for consultation and review of plan of care for this patient.   Evlyn Kanner, NP   11/22/2015 2:39 PM

## 2015-11-22 NOTE — Progress Notes (Signed)
Patient received granix injection yesterday and ANC improved from 1.1 to 3.4.

## 2015-11-23 ENCOUNTER — Other Ambulatory Visit: Payer: Self-pay

## 2015-11-23 ENCOUNTER — Ambulatory Visit: Payer: Self-pay

## 2015-11-23 ENCOUNTER — Ambulatory Visit: Payer: Self-pay | Admitting: Internal Medicine

## 2015-11-29 ENCOUNTER — Inpatient Hospital Stay: Payer: Medicare Other

## 2015-11-29 DIAGNOSIS — C8311 Mantle cell lymphoma, lymph nodes of head, face, and neck: Secondary | ICD-10-CM | POA: Diagnosis not present

## 2015-11-29 DIAGNOSIS — K219 Gastro-esophageal reflux disease without esophagitis: Secondary | ICD-10-CM | POA: Diagnosis not present

## 2015-11-29 DIAGNOSIS — Z79899 Other long term (current) drug therapy: Secondary | ICD-10-CM | POA: Diagnosis not present

## 2015-11-29 DIAGNOSIS — Z7689 Persons encountering health services in other specified circumstances: Secondary | ICD-10-CM | POA: Diagnosis not present

## 2015-11-29 DIAGNOSIS — R6884 Jaw pain: Secondary | ICD-10-CM | POA: Diagnosis not present

## 2015-11-29 DIAGNOSIS — T360X5S Adverse effect of penicillins, sequela: Secondary | ICD-10-CM | POA: Diagnosis not present

## 2015-11-29 DIAGNOSIS — D709 Neutropenia, unspecified: Secondary | ICD-10-CM | POA: Diagnosis not present

## 2015-11-29 DIAGNOSIS — R609 Edema, unspecified: Secondary | ICD-10-CM | POA: Diagnosis not present

## 2015-11-29 DIAGNOSIS — D702 Other drug-induced agranulocytosis: Secondary | ICD-10-CM

## 2015-11-29 DIAGNOSIS — M069 Rheumatoid arthritis, unspecified: Secondary | ICD-10-CM | POA: Diagnosis not present

## 2015-11-29 DIAGNOSIS — E785 Hyperlipidemia, unspecified: Secondary | ICD-10-CM | POA: Diagnosis not present

## 2015-11-29 DIAGNOSIS — K047 Periapical abscess without sinus: Secondary | ICD-10-CM | POA: Diagnosis not present

## 2015-11-29 DIAGNOSIS — T451X5S Adverse effect of antineoplastic and immunosuppressive drugs, sequela: Secondary | ICD-10-CM | POA: Diagnosis not present

## 2015-11-29 DIAGNOSIS — M129 Arthropathy, unspecified: Secondary | ICD-10-CM | POA: Diagnosis not present

## 2015-11-29 LAB — CBC WITH DIFFERENTIAL/PLATELET
Basophils Absolute: 0 10*3/uL (ref 0–0.1)
Basophils Relative: 1 %
Eosinophils Absolute: 0.1 10*3/uL (ref 0–0.7)
Eosinophils Relative: 3 %
HCT: 39.6 % (ref 35.0–47.0)
Hemoglobin: 13.2 g/dL (ref 12.0–16.0)
Lymphocytes Relative: 16 %
Lymphs Abs: 0.7 10*3/uL — ABNORMAL LOW (ref 1.0–3.6)
MCH: 28.7 pg (ref 26.0–34.0)
MCHC: 33.3 g/dL (ref 32.0–36.0)
MCV: 86.3 fL (ref 80.0–100.0)
Monocytes Absolute: 0.4 10*3/uL (ref 0.2–0.9)
Monocytes Relative: 10 %
Neutro Abs: 3.2 10*3/uL (ref 1.4–6.5)
Neutrophils Relative %: 70 %
Platelets: 214 10*3/uL (ref 150–440)
RBC: 4.59 MIL/uL (ref 3.80–5.20)
RDW: 18.2 % — ABNORMAL HIGH (ref 11.5–14.5)
WBC: 4.6 10*3/uL (ref 3.6–11.0)

## 2015-11-30 ENCOUNTER — Other Ambulatory Visit: Payer: Self-pay

## 2015-11-30 ENCOUNTER — Ambulatory Visit: Payer: Self-pay

## 2015-12-06 ENCOUNTER — Inpatient Hospital Stay: Payer: Medicare Other

## 2015-12-06 ENCOUNTER — Inpatient Hospital Stay: Payer: Medicare Other | Attending: Internal Medicine

## 2015-12-06 DIAGNOSIS — Z5112 Encounter for antineoplastic immunotherapy: Secondary | ICD-10-CM | POA: Diagnosis not present

## 2015-12-06 DIAGNOSIS — M069 Rheumatoid arthritis, unspecified: Secondary | ICD-10-CM | POA: Diagnosis not present

## 2015-12-06 DIAGNOSIS — D702 Other drug-induced agranulocytosis: Secondary | ICD-10-CM | POA: Insufficient documentation

## 2015-12-06 DIAGNOSIS — K219 Gastro-esophageal reflux disease without esophagitis: Secondary | ICD-10-CM | POA: Diagnosis not present

## 2015-12-06 DIAGNOSIS — E785 Hyperlipidemia, unspecified: Secondary | ICD-10-CM | POA: Insufficient documentation

## 2015-12-06 DIAGNOSIS — R51 Headache: Secondary | ICD-10-CM | POA: Diagnosis not present

## 2015-12-06 DIAGNOSIS — Z9221 Personal history of antineoplastic chemotherapy: Secondary | ICD-10-CM | POA: Diagnosis not present

## 2015-12-06 DIAGNOSIS — C8311 Mantle cell lymphoma, lymph nodes of head, face, and neck: Secondary | ICD-10-CM | POA: Diagnosis not present

## 2015-12-06 DIAGNOSIS — Z79899 Other long term (current) drug therapy: Secondary | ICD-10-CM | POA: Diagnosis not present

## 2015-12-06 LAB — CBC WITH DIFFERENTIAL/PLATELET
Basophils Absolute: 0 10*3/uL (ref 0–0.1)
Basophils Relative: 0 %
Eosinophils Absolute: 0.1 10*3/uL (ref 0–0.7)
Eosinophils Relative: 2 %
HCT: 38.9 % (ref 35.0–47.0)
Hemoglobin: 13 g/dL (ref 12.0–16.0)
Lymphocytes Relative: 10 %
Lymphs Abs: 0.6 10*3/uL — ABNORMAL LOW (ref 1.0–3.6)
MCH: 29 pg (ref 26.0–34.0)
MCHC: 33.4 g/dL (ref 32.0–36.0)
MCV: 86.6 fL (ref 80.0–100.0)
Monocytes Absolute: 0.4 10*3/uL (ref 0.2–0.9)
Monocytes Relative: 6 %
Neutro Abs: 5.2 10*3/uL (ref 1.4–6.5)
Neutrophils Relative %: 82 %
Platelets: 209 10*3/uL (ref 150–440)
RBC: 4.5 MIL/uL (ref 3.80–5.20)
RDW: 18.4 % — ABNORMAL HIGH (ref 11.5–14.5)
WBC: 6.3 10*3/uL (ref 3.6–11.0)

## 2015-12-07 ENCOUNTER — Other Ambulatory Visit: Payer: Self-pay

## 2015-12-07 ENCOUNTER — Ambulatory Visit: Payer: Self-pay

## 2015-12-13 ENCOUNTER — Inpatient Hospital Stay: Payer: Medicare Other

## 2015-12-13 ENCOUNTER — Inpatient Hospital Stay (HOSPITAL_BASED_OUTPATIENT_CLINIC_OR_DEPARTMENT_OTHER): Payer: Medicare Other | Admitting: Internal Medicine

## 2015-12-13 VITALS — BP 136/88 | HR 93 | Temp 96.5°F | Resp 18 | Wt 210.5 lb

## 2015-12-13 DIAGNOSIS — C8311 Mantle cell lymphoma, lymph nodes of head, face, and neck: Secondary | ICD-10-CM

## 2015-12-13 DIAGNOSIS — E785 Hyperlipidemia, unspecified: Secondary | ICD-10-CM

## 2015-12-13 DIAGNOSIS — M069 Rheumatoid arthritis, unspecified: Secondary | ICD-10-CM

## 2015-12-13 DIAGNOSIS — R51 Headache: Secondary | ICD-10-CM

## 2015-12-13 DIAGNOSIS — Z79899 Other long term (current) drug therapy: Secondary | ICD-10-CM | POA: Diagnosis not present

## 2015-12-13 DIAGNOSIS — Z5112 Encounter for antineoplastic immunotherapy: Secondary | ICD-10-CM | POA: Diagnosis not present

## 2015-12-13 DIAGNOSIS — D702 Other drug-induced agranulocytosis: Secondary | ICD-10-CM

## 2015-12-13 DIAGNOSIS — K219 Gastro-esophageal reflux disease without esophagitis: Secondary | ICD-10-CM

## 2015-12-13 DIAGNOSIS — Z9221 Personal history of antineoplastic chemotherapy: Secondary | ICD-10-CM

## 2015-12-13 LAB — CBC WITH DIFFERENTIAL/PLATELET
Basophils Absolute: 0 10*3/uL (ref 0–0.1)
Basophils Relative: 0 %
Eosinophils Absolute: 0.1 10*3/uL (ref 0–0.7)
Eosinophils Relative: 3 %
HCT: 38.6 % (ref 35.0–47.0)
Hemoglobin: 12.9 g/dL (ref 12.0–16.0)
Lymphocytes Relative: 11 %
Lymphs Abs: 0.4 10*3/uL — ABNORMAL LOW (ref 1.0–3.6)
MCH: 29.2 pg (ref 26.0–34.0)
MCHC: 33.5 g/dL (ref 32.0–36.0)
MCV: 87.2 fL (ref 80.0–100.0)
Monocytes Absolute: 0.2 10*3/uL (ref 0.2–0.9)
Monocytes Relative: 6 %
Neutro Abs: 3.2 10*3/uL (ref 1.4–6.5)
Neutrophils Relative %: 80 %
Platelets: 198 10*3/uL (ref 150–440)
RBC: 4.42 MIL/uL (ref 3.80–5.20)
RDW: 18.9 % — ABNORMAL HIGH (ref 11.5–14.5)
WBC: 4 10*3/uL (ref 3.6–11.0)

## 2015-12-13 NOTE — Progress Notes (Signed)
[place] NOTE  Patient Care Team: Jackolyn Confer, MD as PCP - General (Internal Medicine) Jackolyn Confer, MD (Internal Medicine)  CHIEF COMPLAINTS/PURPOSE OF CONSULTATION: MANTLE CELL LYMPHOMA  Oncology History   # JAN 2017- MANTLE CELL LYMPHOMA STAGE IV; [R Breast LN [place] Core Bx-1.2cm LN/R Ax LN-Bx]; cyclin D Pos; Mitotic rate-LOW; MIPI score [5/intermediate risk]; BMBx-Positive for involvement. 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  female with with a  mantle cell lymphoma  Stage IV- poor tolerance to chemotherapy [extreme neutropenia] status post maintenance Rituxan on May 26th-had to be neutropenia needing Granix multiple times. Patient had been evaluated by dentistry; had tooth extraction done for tooth abscess.  No fever no chills. She has gone back to work.  Patient's appetite is good. Denies any new lumps or bumps. Energy levels are good. no nausea no vomiting. No constipation. No diarrhea or tingling or numbness.  ROS: A complete 10 point review of system is done which is negative except mentioned above in history of present illness  MEDICAL HISTORY:  Past Medical History  Diagnosis Date  . Arthritis   . Headache(784.0)   . GERD (gastroesophageal reflux disease)   . Hyperlipidemia     hx  . Lymphoma, mantle cell ([place]) 06/01/2015    bx of lymph node in right breast  . Lymphadenopathy of head and neck 01/2015    see on Thyroid ultrasound  . Rheumatoid arthritis ([place])   . History of methotrexate therapy     SURGICAL HISTORY: Past Surgical History  Procedure  Laterality Date  . Cardiac catheterization  09/2004    [facility]; EF 60%  . Cardiac catheterization  08/2004    [facility]  . Peripheral vascular catheterization N/A 07/04/2015    Procedure: [name] Insertion;  Surgeon: [name], MD;  Location: [place] [facility];  Service: Cardiovascular;  Laterality: N/A;    SOCIAL HISTORY: Social History   Social History  . Marital Status: Married    Spouse Name: N/A  . Number of Children: N/A  . Years of Education: N/A   Occupational History  . Not on file.   Social History Main Topics  . Smoking status: Never Smoker   . Smokeless tobacco: Never Used  . Alcohol Use: No  . Drug Use: No  . Sexual Activity: Not on file   Other Topics Concern  . 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    FAMILY HISTORY: Family History  Problem Relation Age of Onset  . Diabetes Mother   . Cholelithiasis Mother   . Hypertension Sister   . Diabetes Sister   . Arthritis Brother     ALLERGIES:  has No Known Allergies.  MEDICATIONS:  Current Outpatient Prescriptions  Medication Sig Dispense Refill  . acyclovir (ZOVIRAX) 400 MG tablet     . Calcium Carbonate-Vit D-Min (CALTRATE 600+D PLUS) 600-800 MG-UNIT CHEW Chew 1 tablet by mouth 2 (two) times daily. 60 tablet 6  .  fluticasone (FLONASE) 50 MCG/ACT nasal spray SPRAY TWICE IN EACH NOSTRIL ONCE DAILY  4  . lidocaine-prilocaine (EMLA) cream Apply 1 application topically as needed. 30 g 11  . Multiple Vitamins-Minerals (MULTIVITAMIN GUMMIES ADULT) CHEW Chew by mouth. Reported on 09/06/2015    . NON FORMULARY Reported on 09/06/2015    . omeprazole (PRILOSEC) 40 MG capsule     . ondansetron (ZOFRAN) 8 MG tablet Take 1 tablet (8 mg total) by mouth every 8 (eight) hours as needed for nausea or vomiting (start 3 days; after chemo). 40 tablet 0  . oxyCODONE (OXY IR/ROXICODONE) 5 MG immediate release tablet Take 1 tablet (5 mg total)  by mouth every 4 (four) hours as needed for severe pain. 30 tablet 0  . PREVIDENT 0.2 % SOLN     . prochlorperazine (COMPAZINE) 10 MG tablet Take 1 tablet (10 mg total) by mouth every 6 (six) hours as needed for nausea or vomiting. 30 tablet 0  . traMADol (ULTRAM) 50 MG tablet Take 50 mg by mouth every morning. Reported on 10/08/2015    . vitamin E 400 UNIT capsule Take 400 Units by mouth daily. Reported on 09/06/2015     No current facility-administered medications for this visit.   Facility-Administered Medications Ordered in Other Visits  Medication Dose Route Frequency Provider Last Rate Last Dose  . Tbo-Filgrastim (GRANIX) injection 480 mcg  480 mcg Subcutaneous Once Cammie Sickle, MD   480 mcg at 11/14/15 0840      .  PHYSICAL EXAMINATION: ECOG PERFORMANCE STATUS: 0 - Asymptomatic  Filed Vitals:   12/13/15 0955  BP: 136/88  Pulse: 93  Temp: 96.5 F (35.8 C)  Resp: 18   Filed Weights   12/13/15 0955  Weight: 210 lb 8.6 oz (95.5 kg)    GENERAL: Well-nourished well-developed; Alert, no distress and comfortable. Accompanied by her family.  EYES: no pallor or icterus OROPHARYNX: no thrush or ulceration;  NECK: supple, no masses felt LYMPH:  lymphadenopathy in the cervical, axillary/inguinal lymph nodes resolved. LUNGS: clear to auscultation and No wheeze or crackles HEART/CVS: regular rate & rhythm and no murmurs; No lower extremity edema ABDOMEN: abdomen soft, non-tender and normal bowel sounds; positive for hepatomegaly.  Musculoskeletal:no cyanosis of digits and no clubbing;  PSYCH: alert & oriented x 3 with fluent speech NEURO: no focal motor/sensory deficits SKIN: no rashes or significant lesions  LABORATORY DATA:  I have reviewed the data as listed Lab Results  Component Value Date   WBC 4.0 12/13/2015   HGB 12.9 12/13/2015   HCT 38.6 12/13/2015   MCV 87.2 12/13/2015   PLT 198 12/13/2015    Recent Labs  10/26/15 0859 11/02/15 0958  11/22/15 1020  NA 138 133* 136  K 4.1 4.3 4.3  CL 106 99* 101  CO2 '27 26 28  ' GLUCOSE 98 109* 100*  BUN 22* 19 11  CREATININE 0.65 0.74 0.66  CALCIUM 9.0 9.0 9.2  GFRNONAA >60 >60 >60  GFRAA >60 >60 >60  PROT 6.8 7.5 7.1  ALBUMIN 3.9 3.8 3.9  AST '24 26 23  ' ALT 15 14 13*  ALKPHOS 74 86 94  BILITOT 0.6 1.0 0.6     ASSESSMENT & PLAN:   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   # Patient also had neutropenia post-rituximab-that improved currently. I recommend one more trial of rituximab- patient understands the risk and benefits. Planned rituximab  in 2 weeks; planned every 8 weeks.  # Patient continues to have poor tolerance to rituximab also- I recommend ibrutinib.    Stage IV s/p 1 cycle of bendamustine rituximab; and one cycle of R CHOP chemotherapy- Complete response on PET scan/bone marrow biopsy. Further chemotherapy discontinued because of severe prolonged neutropenia. Rituxan May 26 1. Given the severe prolonged neutropenia/dental infection recommend holding treatment at this time. We will reevaluate; question Ibrutinib as the next step.  # Severe drug-induced neutropenia- status post multiple injections of Granix. Today CBC within normal limits. Repeat CBC in 2 weeks/proceed with rituximab/ weekly CBC that after.  # Patient follow-up with me in 6 weeks.      Cammie Sickle, MD 12/13/2015 8:50 PM

## 2015-12-13 NOTE — Assessment & Plan Note (Signed)
#  MANTLE CELL LYMPHOMA-poor tolerance to chemotherapy sec to severe neutropenia.   # Patient also had neutropenia post-rituximab-that improved currently. I recommend one more trial of rituximab- patient understands the risk and benefits. Planned rituximab in 2 weeks; planned every 8 weeks.  # Patient continues to have poor tolerance to rituximab also- I recommend ibrutinib.    Stage IV s/p 1 cycle of bendamustine rituximab; and one cycle of R CHOP chemotherapy- Complete response on PET scan/bone marrow biopsy. Further chemotherapy discontinued because of severe prolonged neutropenia. Rituxan May 26 1. Given the severe prolonged neutropenia/dental infection recommend holding treatment at this time. We will reevaluate; question Ibrutinib as the next step.  # Severe drug-induced neutropenia- status post multiple injections of Granix. Today CBC within normal limits. Repeat CBC in 2 weeks/proceed with rituximab/ weekly CBC that after.  # Patient follow-up with me in 6 weeks.

## 2015-12-14 ENCOUNTER — Other Ambulatory Visit: Payer: Self-pay

## 2015-12-14 ENCOUNTER — Ambulatory Visit: Payer: Self-pay

## 2015-12-21 ENCOUNTER — Other Ambulatory Visit: Payer: Self-pay

## 2015-12-21 ENCOUNTER — Ambulatory Visit: Payer: Self-pay

## 2015-12-27 ENCOUNTER — Inpatient Hospital Stay: Payer: Medicare Other

## 2015-12-27 ENCOUNTER — Other Ambulatory Visit: Payer: Self-pay | Admitting: Internal Medicine

## 2015-12-27 VITALS — BP 122/77 | HR 71 | Temp 96.7°F | Resp 18

## 2015-12-27 DIAGNOSIS — R51 Headache: Secondary | ICD-10-CM | POA: Diagnosis not present

## 2015-12-27 DIAGNOSIS — E785 Hyperlipidemia, unspecified: Secondary | ICD-10-CM | POA: Diagnosis not present

## 2015-12-27 DIAGNOSIS — Z79899 Other long term (current) drug therapy: Secondary | ICD-10-CM | POA: Diagnosis not present

## 2015-12-27 DIAGNOSIS — Z5112 Encounter for antineoplastic immunotherapy: Secondary | ICD-10-CM | POA: Diagnosis not present

## 2015-12-27 DIAGNOSIS — C8311 Mantle cell lymphoma, lymph nodes of head, face, and neck: Secondary | ICD-10-CM | POA: Diagnosis not present

## 2015-12-27 DIAGNOSIS — K219 Gastro-esophageal reflux disease without esophagitis: Secondary | ICD-10-CM | POA: Diagnosis not present

## 2015-12-27 DIAGNOSIS — Z9221 Personal history of antineoplastic chemotherapy: Secondary | ICD-10-CM | POA: Diagnosis not present

## 2015-12-27 DIAGNOSIS — M069 Rheumatoid arthritis, unspecified: Secondary | ICD-10-CM | POA: Diagnosis not present

## 2015-12-27 DIAGNOSIS — D702 Other drug-induced agranulocytosis: Secondary | ICD-10-CM

## 2015-12-27 LAB — CBC WITH DIFFERENTIAL/PLATELET
Basophils Absolute: 0 10*3/uL (ref 0–0.1)
Basophils Relative: 0 %
Eosinophils Absolute: 0.1 10*3/uL (ref 0–0.7)
Eosinophils Relative: 3 %
HCT: 38 % (ref 35.0–47.0)
Hemoglobin: 12.8 g/dL (ref 12.0–16.0)
Lymphocytes Relative: 9 %
Lymphs Abs: 0.4 10*3/uL — ABNORMAL LOW (ref 1.0–3.6)
MCH: 29.5 pg (ref 26.0–34.0)
MCHC: 33.8 g/dL (ref 32.0–36.0)
MCV: 87.2 fL (ref 80.0–100.0)
Monocytes Absolute: 0.3 10*3/uL (ref 0.2–0.9)
Monocytes Relative: 7 %
Neutro Abs: 3.5 10*3/uL (ref 1.4–6.5)
Neutrophils Relative %: 81 %
Platelets: 174 10*3/uL (ref 150–440)
RBC: 4.36 MIL/uL (ref 3.80–5.20)
RDW: 18.3 % — ABNORMAL HIGH (ref 11.5–14.5)
WBC: 4.3 10*3/uL (ref 3.6–11.0)

## 2015-12-27 MED ORDER — HEPARIN SOD (PORK) LOCK FLUSH 100 UNIT/ML IV SOLN
INTRAVENOUS | Status: AC
  Filled 2015-12-27: qty 5

## 2015-12-27 MED ORDER — ACETAMINOPHEN 325 MG PO TABS
650.0000 mg | ORAL_TABLET | Freq: Once | ORAL | Status: AC
  Administered 2015-12-27: 650 mg via ORAL
  Filled 2015-12-27: qty 2

## 2015-12-27 MED ORDER — SODIUM CHLORIDE 0.9 % IV SOLN: 375.0000 mg/m2 | Freq: Once | INTRAVENOUS | Status: DC

## 2015-12-27 MED ORDER — SODIUM CHLORIDE 0.9% FLUSH
10.0000 mL | INTRAVENOUS | Status: DC | PRN
  Administered 2015-12-27: 10 mL
  Filled 2015-12-27: qty 10

## 2015-12-27 MED ORDER — SODIUM CHLORIDE 0.9 % IV SOLN
375.0000 mg/m2 | Freq: Once | INTRAVENOUS | Status: AC
  Administered 2015-12-27: 800 mg via INTRAVENOUS
  Filled 2015-12-27: qty 50

## 2015-12-27 MED ORDER — DIPHENHYDRAMINE HCL 25 MG PO CAPS
50.0000 mg | ORAL_CAPSULE | Freq: Once | ORAL | Status: AC
  Administered 2015-12-27: 50 mg via ORAL
  Filled 2015-12-27: qty 2

## 2015-12-27 MED ORDER — SODIUM CHLORIDE 0.9 % IV SOLN
Freq: Once | INTRAVENOUS | Status: AC
  Administered 2015-12-27: 09:00:00 via INTRAVENOUS
  Filled 2015-12-27: qty 1000

## 2015-12-27 MED ORDER — HEPARIN SOD (PORK) LOCK FLUSH 100 UNIT/ML IV SOLN
500.0000 [IU] | Freq: Once | INTRAVENOUS | Status: AC | PRN
  Administered 2015-12-27: 500 [IU]

## 2016-01-03 ENCOUNTER — Inpatient Hospital Stay: Payer: Medicare Other | Attending: Internal Medicine

## 2016-01-03 ENCOUNTER — Inpatient Hospital Stay: Payer: Medicare Other

## 2016-01-03 DIAGNOSIS — Z79899 Other long term (current) drug therapy: Secondary | ICD-10-CM | POA: Diagnosis not present

## 2016-01-03 DIAGNOSIS — K219 Gastro-esophageal reflux disease without esophagitis: Secondary | ICD-10-CM | POA: Diagnosis not present

## 2016-01-03 DIAGNOSIS — C8311 Mantle cell lymphoma, lymph nodes of head, face, and neck: Secondary | ICD-10-CM | POA: Diagnosis not present

## 2016-01-03 DIAGNOSIS — M069 Rheumatoid arthritis, unspecified: Secondary | ICD-10-CM | POA: Insufficient documentation

## 2016-01-03 DIAGNOSIS — E785 Hyperlipidemia, unspecified: Secondary | ICD-10-CM | POA: Insufficient documentation

## 2016-01-03 DIAGNOSIS — D709 Neutropenia, unspecified: Secondary | ICD-10-CM | POA: Diagnosis not present

## 2016-01-03 DIAGNOSIS — D702 Other drug-induced agranulocytosis: Secondary | ICD-10-CM

## 2016-01-03 LAB — CBC WITH DIFFERENTIAL/PLATELET
Basophils Absolute: 0 10*3/uL (ref 0–0.1)
Basophils Relative: 0 %
Eosinophils Absolute: 0.2 10*3/uL (ref 0–0.7)
Eosinophils Relative: 3 %
HCT: 39.2 % (ref 35.0–47.0)
Hemoglobin: 13.2 g/dL (ref 12.0–16.0)
Lymphocytes Relative: 10 %
Lymphs Abs: 0.5 10*3/uL — ABNORMAL LOW (ref 1.0–3.6)
MCH: 29.7 pg (ref 26.0–34.0)
MCHC: 33.6 g/dL (ref 32.0–36.0)
MCV: 88.4 fL (ref 80.0–100.0)
Monocytes Absolute: 0.3 10*3/uL (ref 0.2–0.9)
Monocytes Relative: 6 %
Neutro Abs: 4.2 10*3/uL (ref 1.4–6.5)
Neutrophils Relative %: 81 %
Platelets: 188 10*3/uL (ref 150–440)
RBC: 4.43 MIL/uL (ref 3.80–5.20)
RDW: 18 % — ABNORMAL HIGH (ref 11.5–14.5)
WBC: 5.2 10*3/uL (ref 3.6–11.0)

## 2016-01-10 ENCOUNTER — Inpatient Hospital Stay: Payer: Medicare Other | Admitting: *Deleted

## 2016-01-10 ENCOUNTER — Inpatient Hospital Stay: Payer: Medicare Other

## 2016-01-10 DIAGNOSIS — K219 Gastro-esophageal reflux disease without esophagitis: Secondary | ICD-10-CM | POA: Diagnosis not present

## 2016-01-10 DIAGNOSIS — E785 Hyperlipidemia, unspecified: Secondary | ICD-10-CM | POA: Diagnosis not present

## 2016-01-10 DIAGNOSIS — D702 Other drug-induced agranulocytosis: Secondary | ICD-10-CM

## 2016-01-10 DIAGNOSIS — C8311 Mantle cell lymphoma, lymph nodes of head, face, and neck: Secondary | ICD-10-CM | POA: Diagnosis not present

## 2016-01-10 DIAGNOSIS — D709 Neutropenia, unspecified: Secondary | ICD-10-CM | POA: Diagnosis not present

## 2016-01-10 DIAGNOSIS — Z79899 Other long term (current) drug therapy: Secondary | ICD-10-CM | POA: Diagnosis not present

## 2016-01-10 DIAGNOSIS — M069 Rheumatoid arthritis, unspecified: Secondary | ICD-10-CM | POA: Diagnosis not present

## 2016-01-10 DIAGNOSIS — C8318 Mantle cell lymphoma, lymph nodes of multiple sites: Secondary | ICD-10-CM

## 2016-01-10 LAB — CBC WITH DIFFERENTIAL/PLATELET
Basophils Absolute: 0 10*3/uL (ref 0–0.1)
Basophils Relative: 1 %
Eosinophils Absolute: 0.2 10*3/uL (ref 0–0.7)
Eosinophils Relative: 4 %
HCT: 40.3 % (ref 35.0–47.0)
Hemoglobin: 13.3 g/dL (ref 12.0–16.0)
Lymphocytes Relative: 11 %
Lymphs Abs: 0.5 10*3/uL — ABNORMAL LOW (ref 1.0–3.6)
MCH: 29.4 pg (ref 26.0–34.0)
MCHC: 33 g/dL (ref 32.0–36.0)
MCV: 89 fL (ref 80.0–100.0)
Monocytes Absolute: 0.3 10*3/uL (ref 0.2–0.9)
Monocytes Relative: 7 %
Neutro Abs: 3.6 10*3/uL (ref 1.4–6.5)
Neutrophils Relative %: 77 %
Platelets: 194 10*3/uL (ref 150–440)
RBC: 4.53 MIL/uL (ref 3.80–5.20)
RDW: 17.7 % — ABNORMAL HIGH (ref 11.5–14.5)
WBC: 4.6 10*3/uL (ref 3.6–11.0)

## 2016-01-17 ENCOUNTER — Inpatient Hospital Stay: Payer: Medicare Other

## 2016-01-17 DIAGNOSIS — C8318 Mantle cell lymphoma, lymph nodes of multiple sites: Secondary | ICD-10-CM

## 2016-01-17 DIAGNOSIS — E785 Hyperlipidemia, unspecified: Secondary | ICD-10-CM | POA: Diagnosis not present

## 2016-01-17 DIAGNOSIS — Z79899 Other long term (current) drug therapy: Secondary | ICD-10-CM | POA: Diagnosis not present

## 2016-01-17 DIAGNOSIS — D709 Neutropenia, unspecified: Secondary | ICD-10-CM | POA: Diagnosis not present

## 2016-01-17 DIAGNOSIS — M069 Rheumatoid arthritis, unspecified: Secondary | ICD-10-CM | POA: Diagnosis not present

## 2016-01-17 DIAGNOSIS — C8311 Mantle cell lymphoma, lymph nodes of head, face, and neck: Secondary | ICD-10-CM | POA: Diagnosis not present

## 2016-01-17 DIAGNOSIS — K219 Gastro-esophageal reflux disease without esophagitis: Secondary | ICD-10-CM | POA: Diagnosis not present

## 2016-01-17 LAB — CBC WITH DIFFERENTIAL/PLATELET
Basophils Absolute: 0 10*3/uL (ref 0–0.1)
Basophils Relative: 0 %
Eosinophils Absolute: 0.1 10*3/uL (ref 0–0.7)
Eosinophils Relative: 2 %
HCT: 38.9 % (ref 35.0–47.0)
Hemoglobin: 13 g/dL (ref 12.0–16.0)
Lymphocytes Relative: 14 %
Lymphs Abs: 0.6 10*3/uL — ABNORMAL LOW (ref 1.0–3.6)
MCH: 29.5 pg (ref 26.0–34.0)
MCHC: 33.4 g/dL (ref 32.0–36.0)
MCV: 88.5 fL (ref 80.0–100.0)
Monocytes Absolute: 0.3 10*3/uL (ref 0.2–0.9)
Monocytes Relative: 7 %
Neutro Abs: 3.4 10*3/uL (ref 1.4–6.5)
Neutrophils Relative %: 77 %
Platelets: 192 10*3/uL (ref 150–440)
RBC: 4.39 MIL/uL (ref 3.80–5.20)
RDW: 17 % — ABNORMAL HIGH (ref 11.5–14.5)
WBC: 4.5 10*3/uL (ref 3.6–11.0)

## 2016-01-23 ENCOUNTER — Other Ambulatory Visit: Payer: Self-pay

## 2016-01-23 DIAGNOSIS — C8311 Mantle cell lymphoma, lymph nodes of head, face, and neck: Secondary | ICD-10-CM

## 2016-01-24 ENCOUNTER — Inpatient Hospital Stay: Payer: Medicare Other

## 2016-01-24 ENCOUNTER — Inpatient Hospital Stay (HOSPITAL_BASED_OUTPATIENT_CLINIC_OR_DEPARTMENT_OTHER): Payer: Medicare Other | Admitting: Internal Medicine

## 2016-01-24 ENCOUNTER — Encounter: Payer: Self-pay | Admitting: Internal Medicine

## 2016-01-24 VITALS — BP 107/76 | HR 69 | Temp 95.6°F | Resp 17 | Ht 66.0 in | Wt 223.4 lb

## 2016-01-24 DIAGNOSIS — K219 Gastro-esophageal reflux disease without esophagitis: Secondary | ICD-10-CM

## 2016-01-24 DIAGNOSIS — Z79899 Other long term (current) drug therapy: Secondary | ICD-10-CM

## 2016-01-24 DIAGNOSIS — M069 Rheumatoid arthritis, unspecified: Secondary | ICD-10-CM

## 2016-01-24 DIAGNOSIS — E785 Hyperlipidemia, unspecified: Secondary | ICD-10-CM | POA: Diagnosis not present

## 2016-01-24 DIAGNOSIS — D709 Neutropenia, unspecified: Secondary | ICD-10-CM

## 2016-01-24 DIAGNOSIS — C8311 Mantle cell lymphoma, lymph nodes of head, face, and neck: Secondary | ICD-10-CM | POA: Diagnosis not present

## 2016-01-24 DIAGNOSIS — D702 Other drug-induced agranulocytosis: Secondary | ICD-10-CM

## 2016-01-24 LAB — CBC WITH DIFFERENTIAL/PLATELET
Basophils Absolute: 0 10*3/uL (ref 0–0.1)
Basophils Relative: 1 %
Eosinophils Absolute: 0.1 10*3/uL (ref 0–0.7)
Eosinophils Relative: 3 %
HCT: 39.9 % (ref 35.0–47.0)
Hemoglobin: 13.4 g/dL (ref 12.0–16.0)
Lymphocytes Relative: 14 %
Lymphs Abs: 0.5 10*3/uL — ABNORMAL LOW (ref 1.0–3.6)
MCH: 29.7 pg (ref 26.0–34.0)
MCHC: 33.6 g/dL (ref 32.0–36.0)
MCV: 88.2 fL (ref 80.0–100.0)
Monocytes Absolute: 0.3 10*3/uL (ref 0.2–0.9)
Monocytes Relative: 9 %
Neutro Abs: 2.8 10*3/uL (ref 1.4–6.5)
Neutrophils Relative %: 73 %
Platelets: 199 10*3/uL (ref 150–440)
RBC: 4.52 MIL/uL (ref 3.80–5.20)
RDW: 16.7 % — ABNORMAL HIGH (ref 11.5–14.5)
WBC: 3.8 10*3/uL (ref 3.6–11.0)

## 2016-01-24 LAB — COMPREHENSIVE METABOLIC PANEL
ALT: 12 U/L — ABNORMAL LOW (ref 14–54)
AST: 21 U/L (ref 15–41)
Albumin: 3.8 g/dL (ref 3.5–5.0)
Alkaline Phosphatase: 86 U/L (ref 38–126)
Anion gap: 5 (ref 5–15)
BUN: 20 mg/dL (ref 6–20)
CO2: 27 mmol/L (ref 22–32)
Calcium: 8.8 mg/dL — ABNORMAL LOW (ref 8.9–10.3)
Chloride: 106 mmol/L (ref 101–111)
Creatinine, Ser: 0.71 mg/dL (ref 0.44–1.00)
GFR calc Af Amer: >60 mL/min (ref >60)
GFR calc non Af Amer: >60 mL/min (ref >60)
Glucose, Bld: 85 mg/dL (ref 65–99)
Potassium: 4.2 mmol/L (ref 3.5–5.1)
Sodium: 138 mmol/L (ref 135–145)
Total Bilirubin: 0.6 mg/dL (ref 0.3–1.2)
Total Protein: 6.9 g/dL (ref 6.5–8.1)

## 2016-01-24 NOTE — Progress Notes (Signed)
Pt complains of constipation.  Only feels nauseated after brushing teeth

## 2016-01-24 NOTE — Assessment & Plan Note (Addendum)
#   MANTLE CELL LYMPHOMA- STAGE IV-poor tolerance to chemotherapy sec to severe neutropenia [R-benda x1 & R-CHOP x1]- PET CR. Currently on maintenance Rituxan. Will order PET scan at next visit [to be done in 58months from now]  # severe neutropenia- from chemo- improved- labs- reviewed, normal.  # follow up in 4 weeks/ labs/ rituxan. The above plan of care was discussed with the patient. She agrees.  # 25 minutes face-to-face with the patient discussing the above plan of care; more than 50% of time spent on prognosis/ natural history; counseling and coordination.

## 2016-01-24 NOTE — Progress Notes (Signed)
[place] NOTE  Patient Care Team: Jackolyn Confer, MD as PCP - General (Internal Medicine) Jackolyn Confer, MD (Internal Medicine)  CHIEF COMPLAINTS/PURPOSE OF CONSULTATION: MANTLE CELL LYMPHOMA  Oncology History   # JAN 2017- MANTLE CELL LYMPHOMA STAGE IV; [R Breast LN [place] Core Bx-1.2cm LN/R Ax LN-Bx]; cyclin D Pos; Mitotic rate-LOW; MIPI score [5/intermediate risk]; BMBx-Positive for involvement. 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  female with with a  mantle cell lymphoma  Stage IV- poor tolerance to chemotherapy [extreme neutropenia] status post maintenance Rituxan [since May 2017] is here for follow-up. Last rituximab infusion was approximately 4 weeks ago.  Patient did not have any drop of her white count posttreatment. No fever no chills. She has gone back to work.  Patient's appetite is good. Denies any new lumps or bumps. Energy levels are good. no nausea no vomiting. No constipation. No diarrhea or tingling or numbness.  ROS: A complete 10 point review of system is done which is negative except mentioned above in history of present illness  MEDICAL HISTORY:  Past Medical History:  Diagnosis Date  . Arthritis   . GERD (gastroesophageal reflux disease)   . Headache(784.0)   . History of methotrexate therapy   . Hyperlipidemia    hx  . Lymphadenopathy of head and neck 01/2015   see on Thyroid ultrasound  . Lymphoma, mantle cell ([place]) 06/01/2015   bx of lymph node in right breast  . Rheumatoid arthritis ([place])     SURGICAL HISTORY: Past Surgical History:   Procedure Laterality Date  . CARDIAC CATHETERIZATION  09/2004   [facility]; EF 60%  . CARDIAC CATHETERIZATION  08/2004   [facility]  . PERIPHERAL VASCULAR CATHETERIZATION N/A 07/04/2015   Procedure: [name] Insertion;  Surgeon: [name], MD;  Location: [place] [facility];  Service: Cardiovascular;  Laterality: N/A;    SOCIAL HISTORY: Social History   Social History  . Marital status: Married    Spouse name: N/A  . Number of children: N/A  . Years of education: N/A   Occupational History  . Not on file.   Social History Main Topics  . Smoking status: Never Smoker  . Smokeless tobacco: Never Used  . Alcohol use No  . Drug use: No  . Sexual activity: Not on file   Other Topics Concern  . 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    FAMILY HISTORY: Family History  Problem Relation Age of Onset  . Diabetes Mother   . Cholelithiasis Mother   . Hypertension Sister   . Diabetes Sister   . Arthritis Brother     ALLERGIES:  has No Known Allergies.  MEDICATIONS:  Current Outpatient Prescriptions  Medication Sig Dispense Refill  . acyclovir (ZOVIRAX) 400 MG tablet     . Calcium Carbonate-Vit D-Min (CALTRATE 600+D PLUS) 600-800 MG-UNIT CHEW Chew 1 tablet by mouth 2 (two) times daily. 60 tablet 6  . fluticasone (FLONASE)  50 MCG/ACT nasal spray SPRAY TWICE IN EACH NOSTRIL ONCE DAILY  4  . lidocaine-prilocaine (EMLA) cream Apply 1 application topically as needed. 30 g 11  . Multiple Vitamins-Minerals (MULTIVITAMIN GUMMIES ADULT) CHEW Chew by mouth. Reported on 09/06/2015    . NON FORMULARY Reported on 09/06/2015    . PREVIDENT 0.2 % SOLN     . vitamin E 400 UNIT capsule Take 400 Units by mouth daily. Reported on 09/06/2015    . ondansetron (ZOFRAN) 8 MG tablet Take 1 tablet (8 mg total) by mouth every 8 (eight) hours as needed for nausea or vomiting (start 3 days; after chemo). (Patient not taking:  Reported on 01/24/2016) 40 tablet 0  . prochlorperazine (COMPAZINE) 10 MG tablet Take 1 tablet (10 mg total) by mouth every 6 (six) hours as needed for nausea or vomiting. (Patient not taking: Reported on 01/24/2016) 30 tablet 0  . traMADol (ULTRAM) 50 MG tablet Take 50 mg by mouth every morning. Reported on 10/08/2015     No current facility-administered medications for this visit.    Facility-Administered Medications Ordered in Other Visits  Medication Dose Route Frequency Provider Last Rate Last Dose  . Tbo-Filgrastim (GRANIX) injection 480 mcg  480 mcg Subcutaneous Once Cammie Sickle, MD          .  PHYSICAL EXAMINATION: ECOG PERFORMANCE STATUS: 0 - Asymptomatic  Vitals:   01/24/16 0934  BP: 107/76  Pulse: 69  Resp: 17  Temp: (!) 95.6 F (35.3 C)   Filed Weights   01/24/16 0934  Weight: 223 lb 6.4 oz (101.3 kg)    GENERAL: Well-nourished well-developed; Alert, no distress and comfortable. Accompanied by her family.  EYES: no pallor or icterus OROPHARYNX: no thrush or ulceration;  NECK: supple, no masses felt LYMPH:  lymphadenopathy in the cervical, axillary/inguinal lymph nodes resolved. LUNGS: clear to auscultation and No wheeze or crackles HEART/CVS: regular rate & rhythm and no murmurs; No lower extremity edema ABDOMEN: abdomen soft, non-tender and normal bowel sounds; positive for hepatomegaly.  Musculoskeletal:no cyanosis of digits and no clubbing;  PSYCH: alert & oriented x 3 with fluent speech NEURO: no focal motor/sensory deficits SKIN: no rashes or significant lesions  LABORATORY DATA:  I have reviewed the data as listed Lab Results  Component Value Date   WBC 3.8 01/24/2016   HGB 13.4 01/24/2016   HCT 39.9 01/24/2016   MCV 88.2 01/24/2016   PLT 199 01/24/2016    Recent Labs  11/02/15 0958 11/22/15 1020 01/24/16 0905  NA 133* 136 138  K 4.3 4.3 4.2  CL 99* 101 106  CO2 26 28 27   GLUCOSE 109* 100* 85  BUN 19 11 20   CREATININE  0.74 0.66 0.71  CALCIUM 9.0 9.2 8.8*  GFRNONAA >60 >60 >60  GFRAA >60 >60 >60  PROT 7.5 7.1 6.9  ALBUMIN 3.8 3.9 3.8  AST 26 23 21   ALT 14 13* 12*  ALKPHOS 86 94 86  BILITOT 1.0 0.6 0.6     ASSESSMENT & PLAN:   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 Currently on maintenance Rituxan. Will order PET scan at next visit [to be done in 28months from now]  # severe neutropenia- from chemo- improved- labs- reviewed, normal.  # follow up in 4 weeks/ labs/ rituxan. The above plan of care was discussed with the patient. She  agrees.    Cammie Sickle, MD 01/25/2016 8:30 AM

## 2016-02-20 ENCOUNTER — Other Ambulatory Visit: Payer: Self-pay

## 2016-02-20 DIAGNOSIS — C8311 Mantle cell lymphoma, lymph nodes of head, face, and neck: Secondary | ICD-10-CM

## 2016-02-21 ENCOUNTER — Inpatient Hospital Stay: Payer: Medicare Other

## 2016-02-21 ENCOUNTER — Inpatient Hospital Stay: Payer: Medicare Other | Attending: Internal Medicine | Admitting: Internal Medicine

## 2016-02-21 ENCOUNTER — Encounter: Payer: Self-pay | Admitting: Internal Medicine

## 2016-02-21 VITALS — BP 111/76 | HR 71 | Temp 95.8°F | Resp 17 | Ht 66.0 in | Wt 223.4 lb

## 2016-02-21 DIAGNOSIS — D709 Neutropenia, unspecified: Secondary | ICD-10-CM

## 2016-02-21 DIAGNOSIS — M129 Arthropathy, unspecified: Secondary | ICD-10-CM | POA: Diagnosis not present

## 2016-02-21 DIAGNOSIS — E785 Hyperlipidemia, unspecified: Secondary | ICD-10-CM

## 2016-02-21 DIAGNOSIS — Z5112 Encounter for antineoplastic immunotherapy: Secondary | ICD-10-CM | POA: Insufficient documentation

## 2016-02-21 DIAGNOSIS — Z79899 Other long term (current) drug therapy: Secondary | ICD-10-CM | POA: Insufficient documentation

## 2016-02-21 DIAGNOSIS — C8311 Mantle cell lymphoma, lymph nodes of head, face, and neck: Secondary | ICD-10-CM

## 2016-02-21 DIAGNOSIS — K219 Gastro-esophageal reflux disease without esophagitis: Secondary | ICD-10-CM | POA: Insufficient documentation

## 2016-02-21 DIAGNOSIS — M069 Rheumatoid arthritis, unspecified: Secondary | ICD-10-CM | POA: Diagnosis not present

## 2016-02-21 LAB — COMPREHENSIVE METABOLIC PANEL
ALT: 15 U/L (ref 14–54)
AST: 25 U/L (ref 15–41)
Albumin: 4 g/dL (ref 3.5–5.0)
Alkaline Phosphatase: 90 U/L (ref 38–126)
Anion gap: 9 (ref 5–15)
BUN: 18 mg/dL (ref 6–20)
CO2: 22 mmol/L (ref 22–32)
Calcium: 8.6 mg/dL — ABNORMAL LOW (ref 8.9–10.3)
Chloride: 105 mmol/L (ref 101–111)
Creatinine, Ser: 0.75 mg/dL (ref 0.44–1.00)
GFR calc Af Amer: >60 mL/min (ref >60)
GFR calc non Af Amer: >60 mL/min (ref >60)
Glucose, Bld: 110 mg/dL — ABNORMAL HIGH (ref 65–99)
Potassium: 3.6 mmol/L (ref 3.5–5.1)
Sodium: 136 mmol/L (ref 135–145)
Total Bilirubin: 0.6 mg/dL (ref 0.3–1.2)
Total Protein: 7.1 g/dL (ref 6.5–8.1)

## 2016-02-21 LAB — CBC WITH DIFFERENTIAL/PLATELET
Basophils Absolute: 0 10*3/uL (ref 0–0.1)
Basophils Relative: 0 %
Eosinophils Absolute: 0.1 10*3/uL (ref 0–0.7)
Eosinophils Relative: 3 %
HCT: 39.8 % (ref 35.0–47.0)
Hemoglobin: 13.5 g/dL (ref 12.0–16.0)
Lymphocytes Relative: 13 %
Lymphs Abs: 0.6 10*3/uL — ABNORMAL LOW (ref 1.0–3.6)
MCH: 29.6 pg (ref 26.0–34.0)
MCHC: 34.1 g/dL (ref 32.0–36.0)
MCV: 86.9 fL (ref 80.0–100.0)
Monocytes Absolute: 0.3 10*3/uL (ref 0.2–0.9)
Monocytes Relative: 7 %
Neutro Abs: 3.4 10*3/uL (ref 1.4–6.5)
Neutrophils Relative %: 77 %
Platelets: 203 10*3/uL (ref 150–440)
RBC: 4.58 MIL/uL (ref 3.80–5.20)
RDW: 16.3 % — ABNORMAL HIGH (ref 11.5–14.5)
WBC: 4.5 10*3/uL (ref 3.6–11.0)

## 2016-02-21 MED ORDER — DIPHENHYDRAMINE HCL 25 MG PO CAPS
50.0000 mg | ORAL_CAPSULE | Freq: Once | ORAL | Status: AC
  Administered 2016-02-21: 50 mg via ORAL
  Filled 2016-02-21: qty 2

## 2016-02-21 MED ORDER — SODIUM CHLORIDE 0.9 % IV SOLN
375.0000 mg/m2 | Freq: Once | INTRAVENOUS | Status: AC
  Administered 2016-02-21: 800 mg via INTRAVENOUS
  Filled 2016-02-21: qty 50

## 2016-02-21 MED ORDER — HEPARIN SOD (PORK) LOCK FLUSH 100 UNIT/ML IV SOLN
500.0000 [IU] | Freq: Once | INTRAVENOUS | Status: AC
Start: 2016-02-21 — End: 2016-02-21
  Administered 2016-02-21: 500 [IU] via INTRAVENOUS

## 2016-02-21 MED ORDER — ACETAMINOPHEN 325 MG PO TABS
650.0000 mg | ORAL_TABLET | Freq: Once | ORAL | Status: AC
  Administered 2016-02-21: 650 mg via ORAL
  Filled 2016-02-21: qty 2

## 2016-02-21 MED ORDER — SODIUM CHLORIDE 0.9 % IV SOLN
Freq: Once | INTRAVENOUS | Status: AC
  Administered 2016-02-21: 10:00:00 via INTRAVENOUS
  Filled 2016-02-21: qty 1000

## 2016-02-21 MED ORDER — SODIUM CHLORIDE 0.9 % IJ SOLN
10.0000 mL | Freq: Once | INTRAMUSCULAR | Status: AC
  Administered 2016-02-21: 10 mL via INTRAVENOUS
  Filled 2016-02-21: qty 10

## 2016-02-21 NOTE — Assessment & Plan Note (Addendum)
#   MANTLE CELL LYMPHOMA- STAGE IV-poor tolerance to chemotherapy sec to severe neutropenia [R-benda x1 & R-CHOP x1]- PET CR. Currently on maintenance Rituxan since May 2017  # Proceed with rituxan today; labs reviewed- proceed with treatment.  Clinically no evidence of progression. We will get a PET scan; ordered today.  # severe neutropenia- from chemo- improved- labs- reviewed, normal.  # follow up in 4 weeks/ labs/ MD; PET few days prior. The above plan of care was discussed with the patient. She agrees.

## 2016-02-21 NOTE — Progress Notes (Signed)
[place] NOTE  Patient Care Team: Jackolyn Confer, MD as PCP - General (Internal Medicine) Jackolyn Confer, MD (Internal Medicine)  CHIEF COMPLAINTS/PURPOSE OF CONSULTATION: MANTLE CELL LYMPHOMA  Oncology History   # JAN 2017- MANTLE CELL LYMPHOMA STAGE IV; [R Breast LN [place] Core Bx-1.2cm LN/R Ax LN-Bx]; cyclin D Pos; Mitotic rate-LOW; MIPI score [5/intermediate risk]; BMBx-Positive for involvement. 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  female with with a  mantle cell lymphoma  Stage IV- poor tolerance to chemotherapy [extreme neutropenia] status post maintenance Rituxan [since May 2017] is here for follow-up. Last rituximab infusion was approximately 8 weeks ago.  No new fevers. No chills. Her appetite is good. Denies any new lumps or bumps.  Energy levels are good. no nausea no vomiting. No constipation. No diarrhea or tingling or numbness.  ROS: A complete 10 point review of system is done which is negative except mentioned above in history of present illness  MEDICAL HISTORY:  Past Medical History:  Diagnosis Date  . Arthritis   . GERD (gastroesophageal reflux disease)   . Headache(784.0)   . History of methotrexate therapy   . Hyperlipidemia    hx  . Lymphadenopathy of head and neck 01/2015   see on Thyroid ultrasound  . Lymphoma, mantle cell ([place]) 06/01/2015   bx of lymph node in right breast  . Rheumatoid arthritis ([place])     SURGICAL HISTORY: Past Surgical History:  Procedure Laterality Date  . CARDIAC CATHETERIZATION  09/2004   [facility]; EF 60%  . CARDIAC  CATHETERIZATION  08/2004   [facility]  . PERIPHERAL VASCULAR CATHETERIZATION N/A 07/04/2015   Procedure: [name] Insertion;  Surgeon: [name], MD;  Location: [facility];  Service: Cardiovascular;  Laterality: N/A;    SOCIAL HISTORY: Social History   Social History  . Marital status: Married    Spouse name: N/A  . Number of children: N/A  . Years of education: N/A   Occupational History  . Not on file.   Social History Main Topics  . Smoking status: Never Smoker  . Smokeless tobacco: Never Used  . Alcohol use No  . Drug use: No  . Sexual activity: Not on file   Other Topics Concern  . 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    FAMILY HISTORY: Family History  Problem Relation Age of Onset  . Diabetes Mother   . Cholelithiasis Mother   . Hypertension Sister   . Diabetes Sister   . Arthritis Brother     ALLERGIES:  has No Known Allergies.  MEDICATIONS:  Current Outpatient Prescriptions  Medication Sig Dispense Refill  . acyclovir (ZOVIRAX) 400 MG tablet     . Calcium Carbonate-Vit D-Min (CALTRATE 600+D PLUS) 600-800 MG-UNIT CHEW Chew 1 tablet by mouth 2 (two) times daily. 60 tablet 6  . fluticasone (FLONASE) 50 MCG/ACT nasal spray SPRAY TWICE IN EACH NOSTRIL ONCE DAILY  4  . lidocaine-prilocaine (  EMLA) cream Apply 1 application topically as needed. 30 g 11  . Multiple Vitamins-Minerals (MULTIVITAMIN GUMMIES ADULT) CHEW Chew by mouth. Reported on 09/06/2015    . NON FORMULARY Reported on 09/06/2015    . omeprazole (PRILOSEC) 40 MG capsule     . PREVIDENT 0.2 % SOLN     . traMADol (ULTRAM) 50 MG tablet Take 50 mg by mouth every morning. Reported on 10/08/2015    . vitamin E 400 UNIT capsule Take 400 Units by mouth daily. Reported on 09/06/2015    . ondansetron (ZOFRAN) 8 MG tablet Take 1 tablet (8 mg total) by mouth every 8 (eight) hours as needed for nausea or vomiting (start 3  days; after chemo). (Patient not taking: Reported on 02/21/2016) 40 tablet 0  . prochlorperazine (COMPAZINE) 10 MG tablet Take 1 tablet (10 mg total) by mouth every 6 (six) hours as needed for nausea or vomiting. (Patient not taking: Reported on 02/21/2016) 30 tablet 0   No current facility-administered medications for this visit.    Facility-Administered Medications Ordered in Other Visits  Medication Dose Route Frequency Provider Last Rate Last Dose  . Tbo-Filgrastim (GRANIX) injection 480 mcg  480 mcg Subcutaneous Once Cammie Sickle, MD          .  PHYSICAL EXAMINATION: ECOG PERFORMANCE STATUS: 0 - Asymptomatic  Vitals:   02/21/16 0929  BP: 111/76  Pulse: 71  Resp: 17  Temp: (!) 95.8 F (35.4 C)   Filed Weights   02/21/16 0929  Weight: 223 lb 6.4 oz (101.3 kg)    GENERAL: Well-nourished well-developed; Alert, no distress and comfortable. Accompanied by her family.  EYES: no pallor or icterus OROPHARYNX: no thrush or ulceration;  NECK: supple, no masses felt LYMPH:  lymphadenopathy in the cervical, axillary/inguinal lymph nodes resolved. LUNGS: clear to auscultation and No wheeze or crackles HEART/CVS: regular rate & rhythm and no murmurs; No lower extremity edema ABDOMEN: abdomen soft, non-tender and normal bowel sounds; positive for hepatomegaly.  Musculoskeletal:no cyanosis of digits and no clubbing;  PSYCH: alert & oriented x 3 with fluent speech NEURO: no focal motor/sensory deficits SKIN: no rashes or significant lesions  LABORATORY DATA:  I have reviewed the data as listed Lab Results  Component Value Date   WBC 4.5 02/21/2016   HGB 13.5 02/21/2016   HCT 39.8 02/21/2016   MCV 86.9 02/21/2016   PLT 203 02/21/2016    Recent Labs  11/22/15 1020 01/24/16 0905 02/21/16 0914  NA 136 138 136  K 4.3 4.2 3.6  CL 101 106 105  CO2 28 27 22   GLUCOSE 100* 85 110*  BUN 11 20 18   CREATININE 0.66 0.71 0.75  CALCIUM 9.2 8.8* 8.6*  GFRNONAA >60  >60 >60  GFRAA >60 >60 >60  PROT 7.1 6.9 7.1  ALBUMIN 3.9 3.8 4.0  AST 23 21 25   ALT 13* 12* 15  ALKPHOS 94 86 90  BILITOT 0.6 0.6 0.6     ASSESSMENT & PLAN:   No problem-specific Assessment & Plan notes found for this encounter.    Cammie Sickle, MD 02/21/2016 9:39 AM

## 2016-02-21 NOTE — Progress Notes (Signed)
No changes no concerns

## 2016-02-29 ENCOUNTER — Telehealth: Payer: Self-pay | Admitting: *Deleted

## 2016-02-29 NOTE — Telephone Encounter (Signed)
Has a generalized 'head cold."  Patient states that she getting ready to take mucinex otc.  She will call us back if she develops any signs of infection.

## 2016-03-13 ENCOUNTER — Ambulatory Visit
Admission: RE | Admit: 2016-03-13 | Discharge: 2016-03-13 | Disposition: A | Discharge status: Home / Self Care | Payer: Medicare Other | Source: Ambulatory Visit | Attending: Internal Medicine | Admitting: Internal Medicine

## 2016-03-13 DIAGNOSIS — C8311 Mantle cell lymphoma, lymph nodes of head, face, and neck: Secondary | ICD-10-CM | POA: Diagnosis not present

## 2016-03-13 DIAGNOSIS — C8310 Mantle cell lymphoma, unspecified site: Secondary | ICD-10-CM | POA: Diagnosis not present

## 2016-03-13 LAB — GLUCOSE, CAPILLARY: Glucose-Capillary: 84 mg/dL (ref 65–99)

## 2016-03-13 MED ORDER — FLUDEOXYGLUCOSE F - 18 (FDG) INJECTION
12.0000 | Freq: Once | INTRAVENOUS | Status: AC | PRN
  Administered 2016-03-13: 12.4 via INTRAVENOUS

## 2016-03-20 ENCOUNTER — Inpatient Hospital Stay: Payer: Medicare Other | Attending: Internal Medicine

## 2016-03-20 ENCOUNTER — Inpatient Hospital Stay (HOSPITAL_BASED_OUTPATIENT_CLINIC_OR_DEPARTMENT_OTHER): Payer: Medicare Other | Admitting: Internal Medicine

## 2016-03-20 VITALS — BP 129/86 | HR 62 | Temp 97.6°F | Resp 20 | Ht 66.0 in | Wt 237.0 lb

## 2016-03-20 DIAGNOSIS — Z9221 Personal history of antineoplastic chemotherapy: Secondary | ICD-10-CM | POA: Diagnosis not present

## 2016-03-20 DIAGNOSIS — Z79899 Other long term (current) drug therapy: Secondary | ICD-10-CM

## 2016-03-20 DIAGNOSIS — E785 Hyperlipidemia, unspecified: Secondary | ICD-10-CM

## 2016-03-20 DIAGNOSIS — D709 Neutropenia, unspecified: Secondary | ICD-10-CM | POA: Insufficient documentation

## 2016-03-20 DIAGNOSIS — M069 Rheumatoid arthritis, unspecified: Secondary | ICD-10-CM | POA: Insufficient documentation

## 2016-03-20 DIAGNOSIS — K219 Gastro-esophageal reflux disease without esophagitis: Secondary | ICD-10-CM | POA: Diagnosis not present

## 2016-03-20 DIAGNOSIS — C8311 Mantle cell lymphoma, lymph nodes of head, face, and neck: Secondary | ICD-10-CM

## 2016-03-20 LAB — COMPREHENSIVE METABOLIC PANEL
ALT: 13 U/L — ABNORMAL LOW (ref 14–54)
AST: 26 U/L (ref 15–41)
Albumin: 3.7 g/dL (ref 3.5–5.0)
Alkaline Phosphatase: 92 U/L (ref 38–126)
Anion gap: 8 (ref 5–15)
BUN: 15 mg/dL (ref 6–20)
CO2: 27 mmol/L (ref 22–32)
Calcium: 8.8 mg/dL — ABNORMAL LOW (ref 8.9–10.3)
Chloride: 104 mmol/L (ref 101–111)
Creatinine, Ser: 0.63 mg/dL (ref 0.44–1.00)
GFR calc Af Amer: >60 mL/min (ref >60)
GFR calc non Af Amer: >60 mL/min (ref >60)
Glucose, Bld: 123 mg/dL — ABNORMAL HIGH (ref 65–99)
Potassium: 3.9 mmol/L (ref 3.5–5.1)
Sodium: 139 mmol/L (ref 135–145)
Total Bilirubin: 0.7 mg/dL (ref 0.3–1.2)
Total Protein: 6.9 g/dL (ref 6.5–8.1)

## 2016-03-20 LAB — CBC WITH DIFFERENTIAL/PLATELET
Basophils Absolute: 0 10*3/uL (ref 0–0.1)
Basophils Relative: 1 %
Eosinophils Absolute: 0.1 10*3/uL (ref 0–0.7)
Eosinophils Relative: 2 %
HCT: 38.9 % (ref 35.0–47.0)
Hemoglobin: 13 g/dL (ref 12.0–16.0)
Lymphocytes Relative: 15 %
Lymphs Abs: 0.6 10*3/uL — ABNORMAL LOW (ref 1.0–3.6)
MCH: 29.6 pg (ref 26.0–34.0)
MCHC: 33.4 g/dL (ref 32.0–36.0)
MCV: 88.7 fL (ref 80.0–100.0)
Monocytes Absolute: 0.2 10*3/uL (ref 0.2–0.9)
Monocytes Relative: 5 %
Neutro Abs: 3 10*3/uL (ref 1.4–6.5)
Neutrophils Relative %: 77 %
Platelets: 178 10*3/uL (ref 150–440)
RBC: 4.39 MIL/uL (ref 3.80–5.20)
RDW: 15.1 % — ABNORMAL HIGH (ref 11.5–14.5)
WBC: 3.9 10*3/uL (ref 3.6–11.0)

## 2016-03-20 LAB — LACTATE DEHYDROGENASE: LDH: 250 U/L — ABNORMAL HIGH (ref 98–192)

## 2016-03-20 NOTE — Progress Notes (Signed)
[place] NOTE  Patient Care Team: Ricard Dillon, MD as PCP - General (Internal Medicine) Jackolyn Confer, MD (Internal Medicine)  CHIEF COMPLAINTS/PURPOSE OF CONSULTATION: [place]  Oncology History   # JAN 2017- MANTLE CELL LYMPHOMA STAGE IV; [R Breast LN [place] Core Bx-1.2cm LN/R Ax LN-Bx]; cyclin D Pos; Mitotic rate-LOW; MIPI score [5/intermediate risk]; BMBx-Positive for involvement. Feb 9th- START Benda-Ritux with neulasta; Prolonged neutropenia; DISCONT- Benda-Ritux;   # April 13 th 2017- START R-CHOP x1; severe/prolonged neutropenia; PET- CR; BMBx-Neg; Disc R-CHOP  # 26th MAY 2017- Start Rituxan q 15M Main OCT 12th 2017- PET NED.   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  female with with a  mantle cell lymphoma  Stage IV- poor tolerance to chemotherapy [extreme neutropenia] status post maintenance Rituxan [since May 2017] is here for follow-up. Last rituximab infusion was approximately 4 weeks ago.Patient is here to review the results of the PET scan.  No weight loss. In fact weight gain. No night sweats. No new fevers. No chills. Her appetite is good. Denies any new lumps or bumps.no nausea no vomiting. No constipation. No diarrhea or tingling or numbness.  ROS: A complete 10 point review of system is done which is negative except mentioned above in history of present illness  MEDICAL HISTORY:  Past Medical History:  Diagnosis Date  . Arthritis   . GERD (gastroesophageal reflux disease)   . Headache(784.0)   . History of methotrexate therapy   . Hyperlipidemia    hx  . Lymphadenopathy of head and neck 01/2015   see on Thyroid ultrasound  . Lymphoma, mantle cell ([place]) 06/01/2015   bx of lymph node in right breast  . Rheumatoid arthritis ([place])     SURGICAL HISTORY: Past Surgical  History:  Procedure Laterality Date  . CARDIAC CATHETERIZATION  09/2004   [facility]; EF 60%  . CARDIAC CATHETERIZATION  08/2004   [facility]  . PERIPHERAL VASCULAR CATHETERIZATION N/A 07/04/2015   Procedure: [name] Insertion;  Surgeon: [name], MD;  Location: [facility];  Service: Cardiovascular;  Laterality: N/A;    SOCIAL HISTORY: Social History   Social History  . Marital status: Married    Spouse name: N/A  . Number of children: N/A  . Years of education: N/A   Occupational History  . Not on file.   Social History Main Topics  . Smoking status: Never Smoker  . Smokeless tobacco: Never Used  . Alcohol use No  . Drug use: No  . Sexual activity: Not on file   Other Topics Concern  . 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    FAMILY HISTORY: Family History  Problem Relation Age of Onset  . Diabetes Mother   . Cholelithiasis Mother   . Hypertension Sister   . Diabetes Sister   . Arthritis Brother     ALLERGIES:  has No Known Allergies.  MEDICATIONS:  Current Outpatient Prescriptions  Medication Sig Dispense Refill  . Calcium Carbonate-Vit D-Min (CALTRATE 600+D PLUS) 600-800 MG-UNIT CHEW Chew 1 tablet by mouth 2 (two) times daily. 60 tablet 6  . fluticasone (FLONASE) 50 MCG/ACT nasal spray SPRAY TWICE  IN EACH NOSTRIL ONCE DAILY  4  . folic acid (FOLVITE) 1 MG tablet Take 1 mg by mouth daily.    [name] lidocaine-prilocaine (EMLA) cream Apply 1 application topically as needed. 30 g 11  . Multiple Vitamins-Minerals (MULTIVITAMIN GUMMIES ADULT) CHEW Chew by mouth. Reported on 09/06/2015    . omeprazole (PRILOSEC) 40 MG capsule Take 40 mg by mouth daily.     . ondansetron (ZOFRAN) 8 MG tablet Take 1 tablet (8 mg total) by mouth every 8 (eight) hours as needed for nausea or vomiting (start 3 days; after chemo). (Patient not taking: Reported on 03/20/2016) 40 tablet 0  . prochlorperazine  (COMPAZINE) 10 MG tablet Take 1 tablet (10 mg total) by mouth every 6 (six) hours as needed for nausea or vomiting. (Patient not taking: Reported on 03/20/2016) 30 tablet 0   No current facility-administered medications for this visit.    Facility-Administered Medications Ordered in Other Visits  Medication Dose Route Frequency Provider Last Rate Last Dose  . Tbo-Filgrastim (GRANIX) injection 480 mcg  480 mcg Subcutaneous Once Cammie Sickle, MD          .  PHYSICAL EXAMINATION: ECOG PERFORMANCE STATUS: 0 - Asymptomatic  Vitals:   03/20/16 1025  BP: 129/86  Pulse: 62  Resp: 20  Temp: 97.6 F (36.4 C)   Filed Weights   03/20/16 1025  Weight: 237 lb (107.5 kg)    GENERAL: Well-nourished well-developed; Alert, no distress and comfortable. Accompanied by her family.  EYES: no pallor or icterus OROPHARYNX: no thrush or ulceration;  NECK: supple, no masses felt LYMPH:  lymphadenopathy in the cervical, axillary/inguinal lymph nodes resolved. LUNGS: clear to auscultation and No wheeze or crackles HEART/CVS: regular rate & rhythm and no murmurs; No lower extremity edema ABDOMEN: abdomen soft, non-tender and normal bowel sounds; no hepato-splenomegaly.  Musculoskeletal:no cyanosis of digits and no clubbing;  PSYCH: alert & oriented x 3 with fluent speech NEURO: no focal motor/sensory deficits SKIN: no rashes or significant lesions  LABORATORY DATA:  I have reviewed the data as listed Lab Results  Component Value Date   WBC 3.9 03/20/2016   HGB 13.0 03/20/2016   HCT 38.9 03/20/2016   MCV 88.7 03/20/2016   PLT 178 03/20/2016    Recent Labs  01/24/16 0905 02/21/16 0914 03/20/16 1000  NA 138 136 139  K 4.2 3.6 3.9  CL 106 105 104  CO2 27 22 27   GLUCOSE 85 110* 123*  BUN 20 18 15   CREATININE 0.71 0.75 0.63  CALCIUM 8.8* 8.6* 8.8*  GFRNONAA >60 >60 >60  GFRAA >60 >60 >60  PROT 6.9 7.1 6.9  ALBUMIN 3.8 4.0 3.7  AST 21 25 26   ALT 12* 15 13*  ALKPHOS  86 90 92  BILITOT 0.6 0.6 0.7   IMPRESSION: No evidence for hypermetabolic lymph nodes in the neck, chest, abdomen, or pelvis.  No new or progressive findings.   Electronically Signed   By: Misty Stanley M.D.   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 Tolerating Rituxan very well.  # severe neutropenia- from chemo- improved- labs- reviewed, normal.  # follow  up in 4 weeks/rituximab infusion/labs.   # I reviewed the blood work- with the patient in detail; also reviewed the imaging independently [as summarized above]; and with the patient in detail.  # 25 minutes face-to-face with the patient discussing the above plan of care; more than 50% of time spent on prognosis/ natural history; counseling and coordination.    Cammie Sickle, MD 03/20/2016 1:57 PM

## 2016-03-20 NOTE — Assessment & Plan Note (Addendum)
#   MANTLE CELL LYMPHOMA- STAGE IV-poor tolerance to chemotherapy sec to severe neutropenia [R-benda x1 & R-CHOP x1]-  Currently on maintenance Rituxan since May 2017; OCT 12th PET NED [no evidence of any recurrence as progression of disease.]   # continue with maintenance Rituximab every 8 weeks. Tolerating Rituxan very well.  # severe neutropenia- from chemo- improved- labs- reviewed, normal.  # follow up in 4 weeks/rituximab infusion/labs.   # I reviewed the blood work- with the patient in detail; also reviewed the imaging independently [as summarized above]; and with the patient in detail.  # 25 minutes face-to-face with the patient discussing the above plan of care; more than 50% of time spent on prognosis/ natural history; counseling and coordination.

## 2016-03-21 ENCOUNTER — Other Ambulatory Visit: Payer: Self-pay | Admitting: *Deleted

## 2016-03-21 DIAGNOSIS — C8311 Mantle cell lymphoma, lymph nodes of head, face, and neck: Secondary | ICD-10-CM

## 2016-04-17 ENCOUNTER — Inpatient Hospital Stay: Payer: Medicare Other | Attending: Internal Medicine

## 2016-04-17 ENCOUNTER — Inpatient Hospital Stay (HOSPITAL_BASED_OUTPATIENT_CLINIC_OR_DEPARTMENT_OTHER): Payer: Medicare Other | Admitting: Internal Medicine

## 2016-04-17 ENCOUNTER — Inpatient Hospital Stay: Payer: Medicare Other

## 2016-04-17 VITALS — BP 121/76 | HR 71 | Temp 97.6°F | Resp 18 | Wt 236.0 lb

## 2016-04-17 DIAGNOSIS — E785 Hyperlipidemia, unspecified: Secondary | ICD-10-CM | POA: Diagnosis not present

## 2016-04-17 DIAGNOSIS — M069 Rheumatoid arthritis, unspecified: Secondary | ICD-10-CM | POA: Insufficient documentation

## 2016-04-17 DIAGNOSIS — Z5112 Encounter for antineoplastic immunotherapy: Secondary | ICD-10-CM | POA: Diagnosis not present

## 2016-04-17 DIAGNOSIS — K219 Gastro-esophageal reflux disease without esophagitis: Secondary | ICD-10-CM

## 2016-04-17 DIAGNOSIS — C8311 Mantle cell lymphoma, lymph nodes of head, face, and neck: Secondary | ICD-10-CM | POA: Insufficient documentation

## 2016-04-17 DIAGNOSIS — D701 Agranulocytosis secondary to cancer chemotherapy: Secondary | ICD-10-CM | POA: Diagnosis not present

## 2016-04-17 DIAGNOSIS — Z79899 Other long term (current) drug therapy: Secondary | ICD-10-CM

## 2016-04-17 LAB — CBC WITH DIFFERENTIAL/PLATELET
Basophils Absolute: 0 10*3/uL (ref 0–0.1)
Basophils Relative: 1 %
Eosinophils Absolute: 0.1 10*3/uL (ref 0–0.7)
Eosinophils Relative: 5 %
HCT: 39.8 % (ref 35.0–47.0)
Hemoglobin: 13.1 g/dL (ref 12.0–16.0)
Lymphocytes Relative: 29 %
Lymphs Abs: 0.6 10*3/uL — ABNORMAL LOW (ref 1.0–3.6)
MCH: 29.3 pg (ref 26.0–34.0)
MCHC: 32.8 g/dL (ref 32.0–36.0)
MCV: 89.2 fL (ref 80.0–100.0)
Monocytes Absolute: 0.2 10*3/uL (ref 0.2–0.9)
Monocytes Relative: 12 %
Neutro Abs: 1.1 10*3/uL — ABNORMAL LOW (ref 1.4–6.5)
Neutrophils Relative %: 53 %
Platelets: 174 10*3/uL (ref 150–440)
RBC: 4.47 MIL/uL (ref 3.80–5.20)
RDW: 14.7 % — ABNORMAL HIGH (ref 11.5–14.5)
WBC: 2.1 10*3/uL — ABNORMAL LOW (ref 3.6–11.0)

## 2016-04-17 LAB — COMPREHENSIVE METABOLIC PANEL
ALT: 13 U/L — ABNORMAL LOW (ref 14–54)
AST: 25 U/L (ref 15–41)
Albumin: 3.8 g/dL (ref 3.5–5.0)
Alkaline Phosphatase: 87 U/L (ref 38–126)
Anion gap: 6 (ref 5–15)
BUN: 18 mg/dL (ref 6–20)
CO2: 25 mmol/L (ref 22–32)
Calcium: 8.6 mg/dL — ABNORMAL LOW (ref 8.9–10.3)
Chloride: 106 mmol/L (ref 101–111)
Creatinine, Ser: 0.69 mg/dL (ref 0.44–1.00)
GFR calc Af Amer: >60 mL/min (ref >60)
GFR calc non Af Amer: >60 mL/min (ref >60)
Glucose, Bld: 114 mg/dL — ABNORMAL HIGH (ref 65–99)
Potassium: 3.7 mmol/L (ref 3.5–5.1)
Sodium: 137 mmol/L (ref 135–145)
Total Bilirubin: 0.6 mg/dL (ref 0.3–1.2)
Total Protein: 6.8 g/dL (ref 6.5–8.1)

## 2016-04-17 LAB — LACTATE DEHYDROGENASE: LDH: 199 U/L — ABNORMAL HIGH (ref 98–192)

## 2016-04-17 MED ORDER — ACETAMINOPHEN 325 MG PO TABS
650.0000 mg | ORAL_TABLET | Freq: Once | ORAL | Status: AC
  Administered 2016-04-17: 650 mg via ORAL
  Filled 2016-04-17: qty 2

## 2016-04-17 MED ORDER — SODIUM CHLORIDE 0.9 % IV SOLN: 375.0000 mg/m2 | Freq: Once | INTRAVENOUS | Status: DC

## 2016-04-17 MED ORDER — SODIUM CHLORIDE 0.9 % IV SOLN
375.0000 mg/m2 | Freq: Once | INTRAVENOUS | Status: AC
  Administered 2016-04-17: 800 mg via INTRAVENOUS
  Filled 2016-04-17: qty 50

## 2016-04-17 MED ORDER — HEPARIN SOD (PORK) LOCK FLUSH 100 UNIT/ML IV SOLN
500.0000 [IU] | Freq: Once | INTRAVENOUS | Status: AC
  Filled 2016-04-17: qty 5

## 2016-04-17 MED ORDER — SODIUM CHLORIDE 0.9 % IJ SOLN
10.0000 mL | Freq: Once | INTRAMUSCULAR | Status: AC
  Administered 2016-04-17: 10 mL via INTRAVENOUS
  Filled 2016-04-17: qty 10

## 2016-04-17 MED ORDER — DIPHENHYDRAMINE HCL 25 MG PO CAPS
50.0000 mg | ORAL_CAPSULE | Freq: Once | ORAL | Status: AC
  Administered 2016-04-17: 50 mg via ORAL
  Filled 2016-04-17: qty 2

## 2016-04-17 MED ORDER — HEPARIN SOD (PORK) LOCK FLUSH 100 UNIT/ML IV SOLN
500.0000 [IU] | Freq: Once | INTRAVENOUS | Status: AC | PRN
Start: 2016-04-17 — End: 2016-04-17
  Administered 2016-04-17: 500 [IU]

## 2016-04-17 MED ORDER — SODIUM CHLORIDE 0.9 % IV SOLN
Freq: Once | INTRAVENOUS | Status: AC
  Administered 2016-04-17: 10:00:00 via INTRAVENOUS
  Filled 2016-04-17: qty 1000

## 2016-04-17 NOTE — Assessment & Plan Note (Addendum)
#   MANTLE CELL LYMPHOMA- STAGE IV-poor tolerance to chemotherapy sec to severe neutropenia [R-benda x1 & R-CHOP x1]-  Currently on maintenance Rituxan since May 2017; OCT 12th PET NED [no evidence of any recurrence as progression of disease.]   # continue with maintenance Rituximab every 8 weeks; starting today. Tolerating Rituxan very well.  # severe neutropenia- from chemo/currently improved.  ANC 1.2; asymptomatic. Monitor for now.   # follow up in 8 weeks/rituximab infusion/labs. Labs in 4 weeks only.

## 2016-04-17 NOTE — Progress Notes (Signed)
Patient is here for follow up. She is doing well no complaints.  

## 2016-04-17 NOTE — Progress Notes (Signed)
[place] NOTE  Patient Care Team: Ricard Dillon, MD as PCP - General (Internal Medicine) Jackolyn Confer, MD (Internal Medicine)  CHIEF COMPLAINTS/PURPOSE OF CONSULTATION: [place]  Oncology History   # JAN 2017- MANTLE CELL LYMPHOMA STAGE IV; [R Breast LN [place] Core Bx-1.2cm LN/R Ax LN-Bx]; cyclin D Pos; Mitotic rate-LOW; MIPI score [5/intermediate risk]; BMBx-Positive for involvement. Feb 9th- START Benda-Ritux with neulasta; Prolonged neutropenia; DISCONT- Benda-Ritux;   # April 13 th 2017- START R-CHOP x1; severe/prolonged neutropenia; PET- CR; BMBx-Neg; Disc R-CHOP  # 26th MAY 2017- Start Rituxan q 40M Main OCT 12th 2017- PET NED.   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  female with with a  mantle cell lymphoma  Stage IV- poor tolerance to chemotherapy [extreme neutropenia] status post maintenance Rituxan [since May 2017] is here for follow-up. Last rituximab infusion was approximately 8 weeks ago.  She has good energy levels. Gaining weight; No night sweats. No new fevers. No chills. Her appetite is good. Denies any new lumps or bumps.no nausea no vomiting. No constipation. No diarrhea or tingling or numbness.  ROS: A complete 10 point review of system is done which is negative except mentioned above in history of present illness  MEDICAL HISTORY:  Past Medical History:  Diagnosis Date  . Arthritis   . GERD (gastroesophageal reflux disease)   . Headache(784.0)   . History of methotrexate therapy   . Hyperlipidemia    hx  . Lymphadenopathy of head and neck 01/2015   see on Thyroid ultrasound  . Lymphoma, mantle cell ([place]) 06/01/2015   bx of lymph node in right breast  . Rheumatoid arthritis ([place])     SURGICAL HISTORY: Past Surgical History:  Procedure Laterality Date  . CARDIAC  CATHETERIZATION  09/2004   [facility]; EF 60%  . CARDIAC CATHETERIZATION  08/2004   [facility]  . PERIPHERAL VASCULAR CATHETERIZATION N/A 07/04/2015   Procedure: [name] Insertion;  Surgeon: [name], MD;  Location: [facility];  Service: Cardiovascular;  Laterality: N/A;    SOCIAL HISTORY: Social History   Social History  . Marital status: Married    Spouse name: N/A  . Number of children: N/A  . Years of education: N/A   Occupational History  . Not on file.   Social History Main Topics  . Smoking status: Never Smoker  . Smokeless tobacco: Never Used  . Alcohol use No  . Drug use: No  . Sexual activity: Not on file   Other Topics Concern  . 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    FAMILY HISTORY: Family History  Problem Relation Age of Onset  . Diabetes Mother   . Cholelithiasis Mother   . Hypertension Sister   . Diabetes Sister   . Arthritis Brother     ALLERGIES:  has No Known Allergies.  MEDICATIONS:  Current Outpatient Prescriptions  Medication Sig Dispense Refill  . acetaminophen (TYLENOL) 500 MG tablet Take 500 mg by mouth every 6 (six) hours as needed.    . Calcium Carbonate-Vit D-Min (CALTRATE 600+D PLUS) 600-800 MG-UNIT CHEW Chew 1 tablet by mouth 2 (two) times daily. 60 tablet 6  .  cyanocobalamin 500 MCG tablet Take 500 mcg by mouth daily. Take two gummies daily    . folic acid (FOLVITE) 1 MG tablet Take 1 mg by mouth daily.    . hydroxychloroquine (PLAQUENIL) 200 MG tablet Take 400 mg by mouth daily.    [name] lidocaine-prilocaine (EMLA) cream Apply 1 application topically as needed. 30 g 11  . Multiple Vitamins-Minerals (MULTIVITAMIN GUMMIES ADULT) CHEW Chew by mouth. Reported on 09/06/2015    . omeprazole (PRILOSEC) 40 MG capsule Take 40 mg by mouth daily.     . fluticasone (FLONASE) 50 MCG/ACT nasal spray SPRAY TWICE IN EACH NOSTRIL ONCE DAILY  4  . ondansetron  (ZOFRAN) 8 MG tablet Take 1 tablet (8 mg total) by mouth every 8 (eight) hours as needed for nausea or vomiting (start 3 days; after chemo). (Patient not taking: Reported on 04/17/2016) 40 tablet 0  . prochlorperazine (COMPAZINE) 10 MG tablet Take 1 tablet (10 mg total) by mouth every 6 (six) hours as needed for nausea or vomiting. (Patient not taking: Reported on 04/17/2016) 30 tablet 0   No current facility-administered medications for this visit.    Facility-Administered Medications Ordered in Other Visits  Medication Dose Route Frequency Provider Last Rate Last Dose  . Tbo-Filgrastim (GRANIX) injection 480 mcg  480 mcg Subcutaneous Once Cammie Sickle, MD          .  PHYSICAL EXAMINATION: ECOG PERFORMANCE STATUS: 0 - Asymptomatic  Vitals:   04/17/16 0950  BP: 121/76  Pulse: 71  Resp: 18  Temp: 97.6 F (36.4 C)   Filed Weights   04/17/16 0950  Weight: 236 lb (107 kg)    GENERAL: Well-nourished well-developed; Alert, no distress and comfortable. Accompanied by her family.  EYES: no pallor or icterus OROPHARYNX: no thrush or ulceration;  NECK: supple, no masses felt LYMPH:  lymphadenopathy in the cervical, axillary/inguinal lymph nodes resolved. LUNGS: clear to auscultation and No wheeze or crackles HEART/CVS: regular rate & rhythm and no murmurs; No lower extremity edema ABDOMEN: abdomen soft, non-tender and normal bowel sounds; no hepato-splenomegaly.  Musculoskeletal:no cyanosis of digits and no clubbing;  PSYCH: alert & oriented x 3 with fluent speech NEURO: no focal motor/sensory deficits SKIN: no rashes or significant lesions  LABORATORY DATA:  I have reviewed the data as listed Lab Results  Component Value Date   WBC 2.1 (L) 04/17/2016   HGB 13.1 04/17/2016   HCT 39.8 04/17/2016   MCV 89.2 04/17/2016   PLT 174 04/17/2016    Recent Labs  02/21/16 0914 03/20/16 1000 04/17/16 0918  NA 136 139 137  K 3.6 3.9 3.7  CL 105 104 106  CO2 22 27  25   GLUCOSE 110* 123* 114*  BUN 18 15 18   CREATININE 0.75 0.63 0.69  CALCIUM 8.6* 8.8* 8.6*  GFRNONAA >60 >60 >60  GFRAA >60 >60 >60  PROT 7.1 6.9 6.8  ALBUMIN 4.0 3.7 3.8  AST 25 26 25   ALT 15 13* 13*  ALKPHOS 90 92 87  BILITOT 0.6 0.7 0.6   IMPRESSION: No evidence for hypermetabolic lymph nodes in the neck, chest, abdomen, or pelvis.  No new or progressive findings.   Electronically Signed   By: Misty Stanley M.D.   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 Tolerating Rituxan very well.  # severe neutropenia- from chemo/currently improved.  ANC 1.2; asymptomatic. Monitor for now.   # follow up in 8 weeks/rituximab infusion/labs. Labs in 4 weeks only.     Cammie Sickle, MD 04/17/2016 5:09 PM

## 2016-05-15 ENCOUNTER — Inpatient Hospital Stay: Payer: Medicare Other | Attending: Internal Medicine

## 2016-05-15 DIAGNOSIS — C8311 Mantle cell lymphoma, lymph nodes of head, face, and neck: Secondary | ICD-10-CM | POA: Insufficient documentation

## 2016-05-15 LAB — BASIC METABOLIC PANEL
Anion gap: 4 — ABNORMAL LOW (ref 5–15)
BUN: 14 mg/dL (ref 6–20)
CO2: 29 mmol/L (ref 22–32)
Calcium: 8.8 mg/dL — ABNORMAL LOW (ref 8.9–10.3)
Chloride: 106 mmol/L (ref 101–111)
Creatinine, Ser: 0.78 mg/dL (ref 0.44–1.00)
GFR calc Af Amer: >60 mL/min (ref >60)
GFR calc non Af Amer: >60 mL/min (ref >60)
Glucose, Bld: 97 mg/dL (ref 65–99)
Potassium: 4.2 mmol/L (ref 3.5–5.1)
Sodium: 139 mmol/L (ref 135–145)

## 2016-05-15 LAB — CBC WITH DIFFERENTIAL/PLATELET
Basophils Absolute: 0 10*3/uL (ref 0–0.1)
Basophils Relative: 0 %
Eosinophils Absolute: 0.2 10*3/uL (ref 0–0.7)
Eosinophils Relative: 4 %
HCT: 41.3 % (ref 35.0–47.0)
Hemoglobin: 13.6 g/dL (ref 12.0–16.0)
Lymphocytes Relative: 15 %
Lymphs Abs: 0.8 10*3/uL — ABNORMAL LOW (ref 1.0–3.6)
MCH: 29.4 pg (ref 26.0–34.0)
MCHC: 33 g/dL (ref 32.0–36.0)
MCV: 89 fL (ref 80.0–100.0)
Monocytes Absolute: 0.4 10*3/uL (ref 0.2–0.9)
Monocytes Relative: 8 %
Neutro Abs: 3.7 10*3/uL (ref 1.4–6.5)
Neutrophils Relative %: 73 %
Platelets: 183 10*3/uL (ref 150–440)
RBC: 4.64 MIL/uL (ref 3.80–5.20)
RDW: 15.5 % — ABNORMAL HIGH (ref 11.5–14.5)
WBC: 5 10*3/uL (ref 3.6–11.0)

## 2016-05-16 ENCOUNTER — Ambulatory Visit (INDEPENDENT_AMBULATORY_CARE_PROVIDER_SITE_OTHER): Payer: Medicare Other | Admitting: Family Medicine

## 2016-05-16 ENCOUNTER — Encounter: Payer: Self-pay | Admitting: Family Medicine

## 2016-05-16 VITALS — BP 114/68 | HR 71 | Temp 98.1°F | Resp 14 | Wt 243.4 lb

## 2016-05-16 DIAGNOSIS — M069 Rheumatoid arthritis, unspecified: Secondary | ICD-10-CM

## 2016-05-16 DIAGNOSIS — C8311 Mantle cell lymphoma, lymph nodes of head, face, and neck: Secondary | ICD-10-CM | POA: Diagnosis not present

## 2016-05-16 DIAGNOSIS — D702 Other drug-induced agranulocytosis: Secondary | ICD-10-CM

## 2016-05-16 DIAGNOSIS — Z79899 Other long term (current) drug therapy: Secondary | ICD-10-CM

## 2016-05-16 MED ORDER — HYDROXYCHLOROQUINE SULFATE 200 MG PO TABS: 400.0000 mg | ORAL_TABLET | Freq: Every day | ORAL | 0 refills | Status: DC

## 2016-05-16 NOTE — Progress Notes (Signed)
Subjective:  Patient ID: Michelle [name], female    DOB: 07-26-1940  Age: 75 y.o. MRN: [id number]  CC: Follow up  HPI:  75 year old female with RA, osteoporosis, Mantle cell lymphoma presents for follow-up.  RA  Patient states that she has not seen rheumatology in quite some time. She states that she was told that she did not need to follow-up until chemotherapy was finished as her Rituxan would be treating with RA as well.  She is currently on Rituxan via oncology and is on Plaquenil. No longer on methotrexate.  She has just run out of her Plaquenil.  She is in need of refill today.  RA seems to be well controlled at this time and she feels like she is doing well.  Mantle cell lymphoma  Stable at this time on maintenance Rituxan. Followed closely by oncology.  Neutropenia  Patient with recent neutropenia. Now resolved based on labs as of yesterday. Will inform patient today.  Social Hx   Social History   Social History  . Marital status: Married    Spouse name: N/A  . Number of children: N/A  . Years of education: N/A   Social History Main Topics  . Smoking status: Never Smoker  . Smokeless tobacco: Never Used  . Alcohol use No  . Drug use: No  . Sexual activity: Not Asked   Other Topics Concern  . 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    Review of Systems  Constitutional: Negative for fatigue and fever.  Musculoskeletal: Positive for arthralgias.   Objective:  BP 114/68 (BP Location: Left Arm, Patient Position: Sitting, Cuff Size: Large)   Pulse 71   Temp 98.1 F (36.7 C) (Oral)   Resp 14   Wt 243 lb 6 oz (110.4 kg)   SpO2 99%   BMI 39.28 kg/m   BP/Weight 05/16/2016 04/17/2016 [id number]  Systolic BP [id number]  Diastolic BP 68 76 86  Wt. (Lbs) 243.38 236 237  BMI 39.28 38.09 38.25   Physical Exam  Constitutional: She is oriented to person, place, and time.  She appears well-developed. No distress.  Cardiovascular: Normal rate and regular rhythm.   2/6 systolic murmur.  Pulmonary/Chest: Effort normal and breath sounds normal.  Musculoskeletal:  Swelling of MCP joints noted bilaterally.  Neurological: She is alert and oriented to person, place, and time.  Psychiatric: She has a normal mood and affect.  Vitals reviewed.  Lab Results  Component Value Date   WBC 5.0 05/15/2016   HGB 13.6 05/15/2016   HCT 41.3 05/15/2016   PLT 183 05/15/2016   GLUCOSE 97 05/15/2016   CHOL 163 01/12/2014   TRIG 58.0 01/12/2014   HDL 61.40 01/12/2014   LDLCALC 90 01/12/2014   ALT 13 (L) 04/17/2016   AST 25 04/17/2016   NA 139 05/15/2016   K 4.2 05/15/2016   CL 106 05/15/2016   CREATININE 0.78 05/15/2016   BUN 14 05/15/2016   CO2 29 05/15/2016   TSH 0.67 10/13/2011   INR 0.97 10/08/2015   HGBA1C 6.1 01/12/2014   MICROALBUR 0.1 01/12/2014    Assessment & Plan:   Problem List Items Addressed This Visit    Rheumatoid arthritis ([place]) - Primary    Stable. Stopping Folic acid. Refilling Plaquenil today. Has not had eye exam. Will arrange referral to Ophthalmology. Will discuss with Rheumatology.  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 Continue Rituxan per oncology.       Relevant Medications   hydroxychloroquine (PLAQUENIL) 200 MG tablet   Drug-induced neutropenia ([facility])    Now resolved (labs obtained yesterday). Doing well at this time.       Other Visit Diagnoses    High risk medication use       Relevant Orders   Ambulatory referral to Ophthalmology      Meds ordered this encounter  Medications  . hydroxychloroquine (PLAQUENIL) 200 MG tablet    Sig: Take 2 tablets (400 mg total) by mouth daily.    Dispense:  180 tablet    Refill:  0    Follow-up: 6  months  Fl[place] DO [facility]

## 2016-05-16 NOTE — Assessment & Plan Note (Signed)
Stable. Continue Rituxan per oncology.

## 2016-05-16 NOTE — Assessment & Plan Note (Signed)
Now resolved (labs obtained yesterday). Doing well at this time.

## 2016-05-16 NOTE — Patient Instructions (Signed)
Follow up in 6 months.  We will call with your referral to the eye doctor.  Take care  Dr. Lacinda Axon   Happy holidays

## 2016-05-16 NOTE — Assessment & Plan Note (Signed)
Stable. Stopping Folic acid. Refilling Plaquenil today. Has not had eye exam. Will arrange referral to Ophthalmology. Will discuss with Rheumatology.

## 2016-05-16 NOTE — Progress Notes (Signed)
Pre visit review using our clinic review tool, if applicable. No additional management support is needed unless otherwise documented below in the visit note. 

## 2016-05-20 ENCOUNTER — Encounter: Payer: Self-pay | Admitting: Family Medicine

## 2016-06-12 ENCOUNTER — Inpatient Hospital Stay: Payer: Medicare HMO

## 2016-06-12 ENCOUNTER — Inpatient Hospital Stay: Payer: Medicare HMO | Attending: Internal Medicine

## 2016-06-12 ENCOUNTER — Inpatient Hospital Stay (HOSPITAL_BASED_OUTPATIENT_CLINIC_OR_DEPARTMENT_OTHER): Payer: Medicare HMO | Admitting: Internal Medicine

## 2016-06-12 VITALS — BP 129/73 | HR 66 | Temp 97.0°F | Wt 244.0 lb

## 2016-06-12 DIAGNOSIS — D709 Neutropenia, unspecified: Secondary | ICD-10-CM | POA: Insufficient documentation

## 2016-06-12 DIAGNOSIS — C8311 Mantle cell lymphoma, lymph nodes of head, face, and neck: Secondary | ICD-10-CM

## 2016-06-12 DIAGNOSIS — K219 Gastro-esophageal reflux disease without esophagitis: Secondary | ICD-10-CM | POA: Diagnosis not present

## 2016-06-12 DIAGNOSIS — Z9221 Personal history of antineoplastic chemotherapy: Secondary | ICD-10-CM | POA: Diagnosis not present

## 2016-06-12 DIAGNOSIS — R635 Abnormal weight gain: Secondary | ICD-10-CM | POA: Diagnosis not present

## 2016-06-12 DIAGNOSIS — M129 Arthropathy, unspecified: Secondary | ICD-10-CM | POA: Insufficient documentation

## 2016-06-12 DIAGNOSIS — E785 Hyperlipidemia, unspecified: Secondary | ICD-10-CM

## 2016-06-12 DIAGNOSIS — Z79899 Other long term (current) drug therapy: Secondary | ICD-10-CM | POA: Insufficient documentation

## 2016-06-12 DIAGNOSIS — Z5112 Encounter for antineoplastic immunotherapy: Secondary | ICD-10-CM | POA: Diagnosis not present

## 2016-06-12 DIAGNOSIS — M069 Rheumatoid arthritis, unspecified: Secondary | ICD-10-CM | POA: Diagnosis not present

## 2016-06-12 LAB — CBC WITH DIFFERENTIAL/PLATELET
Basophils Absolute: 0 10*3/uL (ref 0–0.1)
Basophils Relative: 0 %
Eosinophils Absolute: 0.1 10*3/uL (ref 0–0.7)
Eosinophils Relative: 2 %
HCT: 39.5 % (ref 35.0–47.0)
Hemoglobin: 13.2 g/dL (ref 12.0–16.0)
Lymphocytes Relative: 18 %
Lymphs Abs: 0.8 10*3/uL — ABNORMAL LOW (ref 1.0–3.6)
MCH: 29.6 pg (ref 26.0–34.0)
MCHC: 33.5 g/dL (ref 32.0–36.0)
MCV: 88.3 fL (ref 80.0–100.0)
Monocytes Absolute: 0.4 10*3/uL (ref 0.2–0.9)
Monocytes Relative: 8 %
Neutro Abs: 3.3 10*3/uL (ref 1.4–6.5)
Neutrophils Relative %: 72 %
Platelets: 168 10*3/uL (ref 150–440)
RBC: 4.47 MIL/uL (ref 3.80–5.20)
RDW: 15.2 % — ABNORMAL HIGH (ref 11.5–14.5)
WBC: 4.6 10*3/uL (ref 3.6–11.0)

## 2016-06-12 LAB — COMPREHENSIVE METABOLIC PANEL
ALT: 13 U/L — ABNORMAL LOW (ref 14–54)
AST: 24 U/L (ref 15–41)
Albumin: 3.7 g/dL (ref 3.5–5.0)
Alkaline Phosphatase: 82 U/L (ref 38–126)
Anion gap: 5 (ref 5–15)
BUN: 13 mg/dL (ref 6–20)
CO2: 26 mmol/L (ref 22–32)
Calcium: 8.7 mg/dL — ABNORMAL LOW (ref 8.9–10.3)
Chloride: 109 mmol/L (ref 101–111)
Creatinine, Ser: 0.72 mg/dL (ref 0.44–1.00)
GFR calc Af Amer: >60 mL/min (ref >60)
GFR calc non Af Amer: >60 mL/min (ref >60)
Glucose, Bld: 99 mg/dL (ref 65–99)
Potassium: 3.8 mmol/L (ref 3.5–5.1)
Sodium: 140 mmol/L (ref 135–145)
Total Bilirubin: 0.6 mg/dL (ref 0.3–1.2)
Total Protein: 6.6 g/dL (ref 6.5–8.1)

## 2016-06-12 MED ORDER — ACETAMINOPHEN 325 MG PO TABS
650.0000 mg | ORAL_TABLET | Freq: Once | ORAL | Status: AC
  Administered 2016-06-12: 650 mg via ORAL
  Filled 2016-06-12: qty 2

## 2016-06-12 MED ORDER — DIPHENHYDRAMINE HCL 25 MG PO CAPS
50.0000 mg | ORAL_CAPSULE | Freq: Once | ORAL | Status: AC
  Administered 2016-06-12: 50 mg via ORAL
  Filled 2016-06-12: qty 2

## 2016-06-12 MED ORDER — HEPARIN SOD (PORK) LOCK FLUSH 100 UNIT/ML IV SOLN
500.0000 [IU] | Freq: Once | INTRAVENOUS | Status: AC
  Administered 2016-06-12: 500 [IU] via INTRAVENOUS
  Filled 2016-06-12: qty 5

## 2016-06-12 MED ORDER — SODIUM CHLORIDE 0.9 % IV SOLN: 375.0000 mg/m2 | Freq: Once | INTRAVENOUS | Status: DC

## 2016-06-12 MED ORDER — SODIUM CHLORIDE 0.9 % IV SOLN
Freq: Once | INTRAVENOUS | Status: AC
  Administered 2016-06-12: 10:00:00 via INTRAVENOUS
  Filled 2016-06-12: qty 1000

## 2016-06-12 MED ORDER — SODIUM CHLORIDE 0.9 % IJ SOLN
10.0000 mL | Freq: Once | INTRAMUSCULAR | Status: AC
  Administered 2016-06-12: 10 mL via INTRAVENOUS
  Filled 2016-06-12: qty 10

## 2016-06-12 MED ORDER — SODIUM CHLORIDE 0.9 % IV SOLN
800.0000 mg | Freq: Once | INTRAVENOUS | Status: AC
  Administered 2016-06-12: 800 mg via INTRAVENOUS
  Filled 2016-06-12: qty 50

## 2016-06-12 NOTE — Progress Notes (Signed)
Patient here today for follow up.   

## 2016-06-12 NOTE — Progress Notes (Signed)
[place] NOTE  Patient Care Team: Coral Spikes, DO as PCP - General (Family Medicine) Jackolyn Confer, MD (Internal Medicine)  CHIEF COMPLAINTS/PURPOSE OF CONSULTATION: [place]  Oncology History   # JAN 2017- MANTLE CELL LYMPHOMA STAGE IV; [R Breast LN [place] Core Bx-1.2cm LN/R Ax LN-Bx]; cyclin D Pos; Mitotic rate-LOW; MIPI score [5/intermediate risk]; BMBx-Positive for involvement. Feb 9th- START Benda-Ritux with neulasta; Prolonged neutropenia; DISCONT- Benda-Ritux;   # April 13 th 2017- START R-CHOP x1; severe/prolonged neutropenia; PET- CR; BMBx-Neg; Disc R-CHOP  # 26th MAY 2017- Start Rituxan q 53M Main OCT 12th 2017- PET NED.   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  female with with a  mantle cell lymphoma  Stage IV- poor tolerance to chemotherapy [extreme neutropenia] status post maintenance Rituxan [since May 2017] is here for follow-up. Last rituximab infusion was approximately 8 weeks ago.  Patient admits to gaining weight. Denies any swelling in the legs. Denies any new lumps or bumps. No night sweats. No tingling or numbness. No recent infections.  ROS: A complete 10 point review of system is done which is negative except mentioned above in history of present illness  MEDICAL HISTORY:  Past Medical History:  Diagnosis Date  . Arthritis   . GERD (gastroesophageal reflux disease)   . Headache(784.0)   . History of methotrexate therapy   . Hyperlipidemia    hx  . Lymphadenopathy of head and neck 01/2015   see on Thyroid ultrasound  . Lymphoma, mantle cell ([place]) 06/01/2015   bx of lymph node in right breast  . Rheumatoid arthritis ([place])     SURGICAL HISTORY: Past Surgical History:  Procedure Laterality Date  . CARDIAC CATHETERIZATION  09/2004   [facility]; EF 60%  . CARDIAC  CATHETERIZATION  08/2004   [facility]  . PERIPHERAL VASCULAR CATHETERIZATION N/A 07/04/2015   Procedure: [name] Insertion;  Surgeon: [name], MD;  Location: [facility];  Service: Cardiovascular;  Laterality: N/A;    SOCIAL HISTORY: Social History   Social History  . Marital status: Married    Spouse name: N/A  . Number of children: N/A  . Years of education: N/A   Occupational History  . Not on file.   Social History Main Topics  . Smoking status: Never Smoker  . Smokeless tobacco: Never Used  . Alcohol use No  . Drug use: No  . Sexual activity: Not on file   Other Topics Concern  . 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    FAMILY HISTORY: Family History  Problem Relation Age of Onset  . Diabetes Mother   . Cholelithiasis Mother   . Hypertension Sister   . Diabetes Sister   . Arthritis Brother     ALLERGIES:  has No Known Allergies.  MEDICATIONS:  Current Outpatient Prescriptions  Medication Sig Dispense Refill  . acetaminophen (TYLENOL) 500 MG tablet Take 500 mg by mouth every 6 (six) hours as needed.    . Calcium Carbonate-Vit D-Min (CALTRATE 600+D PLUS) 600-800 MG-UNIT CHEW Chew 1 tablet by mouth 2 (two) times daily. 60 tablet 6  . cyanocobalamin 500 MCG tablet Take 500 mcg  by mouth daily. Take two gummies daily    . fluticasone (FLONASE) 50 MCG/ACT nasal spray SPRAY TWICE IN EACH NOSTRIL ONCE DAILY  4  . hydroxychloroquine (PLAQUENIL) 200 MG tablet Take 2 tablets (400 mg total) by mouth daily. 180 tablet 0  . lidocaine-prilocaine (EMLA) cream Apply 1 application topically as needed. 30 g 11  . Multiple Vitamins-Minerals (MULTIVITAMIN GUMMIES ADULT) CHEW Chew by mouth. Reported on 09/06/2015    . omeprazole (PRILOSEC) 40 MG capsule Take 40 mg by mouth daily.     . ondansetron (ZOFRAN) 8 MG tablet Take 1 tablet (8 mg total) by mouth every 8 (eight) hours as needed for  nausea or vomiting (start 3 days; after chemo). 40 tablet 0  . prochlorperazine (COMPAZINE) 10 MG tablet Take 1 tablet (10 mg total) by mouth every 6 (six) hours as needed for nausea or vomiting. 30 tablet 0   No current facility-administered medications for this visit.    Facility-Administered Medications Ordered in Other Visits  Medication Dose Route Frequency Provider Last Rate Last Dose  . Tbo-Filgrastim (GRANIX) injection 480 mcg  480 mcg Subcutaneous Once Cammie Sickle, MD          .  PHYSICAL EXAMINATION: ECOG PERFORMANCE STATUS: 0 - Asymptomatic  Vitals:   06/12/16 0857  BP: 129/73  Pulse: 66  Temp: 97 F (36.1 C)   Filed Weights   06/12/16 0857  Weight: 244 lb (110.7 kg)    GENERAL: Well-nourished well-developed; Alert, no distress and comfortable. Accompanied by her family.  EYES: no pallor or icterus OROPHARYNX: no thrush or ulceration;  NECK: supple, no masses felt LYMPH:  lymphadenopathy in the cervical, axillary/inguinal lymph nodes resolved. LUNGS: clear to auscultation and No wheeze or crackles HEART/CVS: regular rate & rhythm and no murmurs; No lower extremity edema ABDOMEN: abdomen soft, non-tender and normal bowel sounds; no hepato-splenomegaly.  Musculoskeletal:no cyanosis of digits and no clubbing;  PSYCH: alert & oriented x 3 with fluent speech NEURO: no focal motor/sensory deficits SKIN: no rashes or significant lesions  LABORATORY DATA:  I have reviewed the data as listed Lab Results  Component Value Date   WBC 4.6 06/12/2016   HGB 13.2 06/12/2016   HCT 39.5 06/12/2016   MCV 88.3 06/12/2016   PLT 168 06/12/2016    Recent Labs  03/20/16 1000 04/17/16 0918 05/15/16 0935 06/12/16 0830  NA 139 137 139 140  K 3.9 3.7 4.2 3.8  CL 104 106 106 109  CO2 27 25 29 26   GLUCOSE 123* 114* 97 99  BUN 15 18 14 13   CREATININE 0.63 0.69 0.78 0.72  CALCIUM 8.8* 8.6* 8.8* 8.7*  GFRNONAA >60 >60 >60 >60  GFRAA >60 >60 >60 >60  PROT  6.9 6.8  --  6.6  ALBUMIN 3.7 3.8  --  3.7  AST 26 25  --  24  ALT 13* 13*  --  13*  ALKPHOS 92 87  --  82  BILITOT 0.7 0.6  --  0.6   IMPRESSION: No evidence for hypermetabolic lymph nodes in the neck, chest, abdomen, or pelvis.  No new or progressive findings.   Electronically Signed   By: Misty Stanley M.D.   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   # continue with maintenance Rituximab every 8 weeks; starting today. Tolerating Rituxan very well.  # severe neutropenia- from chemo/currently improved.  ANC-3.0   # follow up in 8 weeks/rituximab infusion/labs. Labs in 4 weeks only. Will order PET scan at next visit.     Cammie Sickle, MD 06/12/2016 4:07 PM

## 2016-06-12 NOTE — Assessment & Plan Note (Addendum)
#   MANTLE CELL LYMPHOMA- STAGE IV-poor tolerance to chemotherapy sec to severe neutropenia [R-benda x1 & R-CHOP x1]-  Currently on maintenance Rituxan since May 2017; OCT 12th PET NED [no evidence of any recurrence as progression of disease.] clinically doing well; no concerns of progression.   # continue with maintenance Rituximab every 8 weeks; starting today. Tolerating Rituxan very well.  # severe neutropenia- from chemo/currently improved.  ANC-3.0   # follow up in 8 weeks/rituximab infusion/labs. Labs in 4 weeks only. Will order PET scan at next visit.

## 2016-07-07 DIAGNOSIS — Z79899 Other long term (current) drug therapy: Secondary | ICD-10-CM | POA: Diagnosis not present

## 2016-07-07 DIAGNOSIS — M069 Rheumatoid arthritis, unspecified: Secondary | ICD-10-CM | POA: Diagnosis not present

## 2016-07-10 ENCOUNTER — Inpatient Hospital Stay: Payer: Medicare HMO | Attending: Internal Medicine

## 2016-07-10 DIAGNOSIS — C8311 Mantle cell lymphoma, lymph nodes of head, face, and neck: Secondary | ICD-10-CM

## 2016-07-10 DIAGNOSIS — Z8572 Personal history of non-Hodgkin lymphomas: Secondary | ICD-10-CM | POA: Insufficient documentation

## 2016-07-10 LAB — CBC WITH DIFFERENTIAL/PLATELET
Basophils Absolute: 0 10*3/uL (ref 0–0.1)
Basophils Relative: 0 %
Eosinophils Absolute: 0.1 10*3/uL (ref 0–0.7)
Eosinophils Relative: 2 %
HCT: 39.4 % (ref 35.0–47.0)
Hemoglobin: 13.2 g/dL (ref 12.0–16.0)
Lymphocytes Relative: 19 %
Lymphs Abs: 1 10*3/uL (ref 1.0–3.6)
MCH: 29.4 pg (ref 26.0–34.0)
MCHC: 33.5 g/dL (ref 32.0–36.0)
MCV: 87.8 fL (ref 80.0–100.0)
Monocytes Absolute: 0.4 10*3/uL (ref 0.2–0.9)
Monocytes Relative: 7 %
Neutro Abs: 4 10*3/uL (ref 1.4–6.5)
Neutrophils Relative %: 72 %
Platelets: 191 10*3/uL (ref 150–440)
RBC: 4.48 MIL/uL (ref 3.80–5.20)
RDW: 15.5 % — ABNORMAL HIGH (ref 11.5–14.5)
WBC: 5.6 10*3/uL (ref 3.6–11.0)

## 2016-08-11 ENCOUNTER — Other Ambulatory Visit: Payer: Self-pay

## 2016-08-11 ENCOUNTER — Ambulatory Visit: Payer: Self-pay

## 2016-08-11 ENCOUNTER — Ambulatory Visit: Payer: Self-pay | Admitting: Internal Medicine

## 2016-08-11 ENCOUNTER — Other Ambulatory Visit: Payer: Self-pay | Admitting: Family Medicine

## 2016-08-11 DIAGNOSIS — M069 Rheumatoid arthritis, unspecified: Secondary | ICD-10-CM

## 2016-08-15 ENCOUNTER — Inpatient Hospital Stay (HOSPITAL_BASED_OUTPATIENT_CLINIC_OR_DEPARTMENT_OTHER): Payer: Medicare HMO | Admitting: Internal Medicine

## 2016-08-15 ENCOUNTER — Inpatient Hospital Stay: Payer: Medicare HMO | Attending: Internal Medicine

## 2016-08-15 ENCOUNTER — Inpatient Hospital Stay: Payer: Medicare HMO

## 2016-08-15 VITALS — BP 113/64 | HR 68 | Temp 96.7°F | Resp 18 | Wt 249.0 lb

## 2016-08-15 DIAGNOSIS — M129 Arthropathy, unspecified: Secondary | ICD-10-CM | POA: Diagnosis not present

## 2016-08-15 DIAGNOSIS — M069 Rheumatoid arthritis, unspecified: Secondary | ICD-10-CM | POA: Insufficient documentation

## 2016-08-15 DIAGNOSIS — C8311 Mantle cell lymphoma, lymph nodes of head, face, and neck: Secondary | ICD-10-CM

## 2016-08-15 DIAGNOSIS — K219 Gastro-esophageal reflux disease without esophagitis: Secondary | ICD-10-CM

## 2016-08-15 DIAGNOSIS — D709 Neutropenia, unspecified: Secondary | ICD-10-CM | POA: Diagnosis not present

## 2016-08-15 DIAGNOSIS — Z5112 Encounter for antineoplastic immunotherapy: Secondary | ICD-10-CM | POA: Diagnosis not present

## 2016-08-15 DIAGNOSIS — Z79899 Other long term (current) drug therapy: Secondary | ICD-10-CM | POA: Diagnosis not present

## 2016-08-15 DIAGNOSIS — E785 Hyperlipidemia, unspecified: Secondary | ICD-10-CM

## 2016-08-15 LAB — CBC WITH DIFFERENTIAL/PLATELET
Basophils Absolute: 0.1 10*3/uL (ref 0–0.1)
Basophils Relative: 2 %
Eosinophils Absolute: 0.1 10*3/uL (ref 0–0.7)
Eosinophils Relative: 2 %
HCT: 39.2 % (ref 35.0–47.0)
Hemoglobin: 13.2 g/dL (ref 12.0–16.0)
Lymphocytes Relative: 11 %
Lymphs Abs: 0.6 10*3/uL — ABNORMAL LOW (ref 1.0–3.6)
MCH: 29.7 pg (ref 26.0–34.0)
MCHC: 33.6 g/dL (ref 32.0–36.0)
MCV: 88.3 fL (ref 80.0–100.0)
Monocytes Absolute: 0.3 10*3/uL (ref 0.2–0.9)
Monocytes Relative: 5 %
Neutro Abs: 4.7 10*3/uL (ref 1.4–6.5)
Neutrophils Relative %: 80 %
Platelets: 179 10*3/uL (ref 150–440)
RBC: 4.44 MIL/uL (ref 3.80–5.20)
RDW: 15.5 % — ABNORMAL HIGH (ref 11.5–14.5)
WBC: 5.8 10*3/uL (ref 3.6–11.0)

## 2016-08-15 LAB — COMPREHENSIVE METABOLIC PANEL
ALT: 12 U/L — ABNORMAL LOW (ref 14–54)
AST: 23 U/L (ref 15–41)
Albumin: 3.7 g/dL (ref 3.5–5.0)
Alkaline Phosphatase: 79 U/L (ref 38–126)
Anion gap: 4 — ABNORMAL LOW (ref 5–15)
BUN: 18 mg/dL (ref 6–20)
CO2: 29 mmol/L (ref 22–32)
Calcium: 8.8 mg/dL — ABNORMAL LOW (ref 8.9–10.3)
Chloride: 106 mmol/L (ref 101–111)
Creatinine, Ser: 0.79 mg/dL (ref 0.44–1.00)
GFR calc Af Amer: >60 mL/min (ref >60)
GFR calc non Af Amer: >60 mL/min (ref >60)
Glucose, Bld: 96 mg/dL (ref 65–99)
Potassium: 4 mmol/L (ref 3.5–5.1)
Sodium: 139 mmol/L (ref 135–145)
Total Bilirubin: 0.6 mg/dL (ref 0.3–1.2)
Total Protein: 6.7 g/dL (ref 6.5–8.1)

## 2016-08-15 MED ORDER — SODIUM CHLORIDE 0.9 % IV SOLN: 375.0000 mg/m2 | Freq: Once | INTRAVENOUS | Status: DC

## 2016-08-15 MED ORDER — SODIUM CHLORIDE 0.9 % IV SOLN
Freq: Once | INTRAVENOUS | Status: AC
  Administered 2016-08-15: 09:00:00 via INTRAVENOUS
  Filled 2016-08-15: qty 1000

## 2016-08-15 MED ORDER — SODIUM CHLORIDE 0.9 % IV SOLN
800.0000 mg | Freq: Once | INTRAVENOUS | Status: AC
  Administered 2016-08-15: 800 mg via INTRAVENOUS
  Filled 2016-08-15: qty 50

## 2016-08-15 MED ORDER — DIPHENHYDRAMINE HCL 25 MG PO CAPS
50.0000 mg | ORAL_CAPSULE | Freq: Once | ORAL | Status: AC
  Administered 2016-08-15: 50 mg via ORAL
  Filled 2016-08-15: qty 2

## 2016-08-15 MED ORDER — HEPARIN SOD (PORK) LOCK FLUSH 100 UNIT/ML IV SOLN
500.0000 [IU] | Freq: Once | INTRAVENOUS | Status: AC | PRN
  Administered 2016-08-15: 500 [IU]
  Filled 2016-08-15: qty 5

## 2016-08-15 MED ORDER — ACETAMINOPHEN 325 MG PO TABS
650.0000 mg | ORAL_TABLET | Freq: Once | ORAL | Status: AC
  Administered 2016-08-15: 650 mg via ORAL
  Filled 2016-08-15: qty 2

## 2016-08-15 MED ORDER — SODIUM CHLORIDE 0.9% FLUSH
10.0000 mL | INTRAVENOUS | Status: DC | PRN
  Administered 2016-08-15: 10 mL
  Filled 2016-08-15: qty 10

## 2016-08-15 NOTE — Assessment & Plan Note (Signed)
#   MANTLE CELL LYMPHOMA- STAGE IV-poor tolerance to chemotherapy sec to severe neutropenia [R-benda x1 & R-CHOP x1]-  Currently on maintenance Rituxan since May 2017; OCT 12th PET NED [no evidence of any recurrence as progression of disease.]   # Clinically doing well; no concerns of progression. continue with maintenance Rituximab every 8 weeks; Tolerating Rituxan very well. Labs today reviewed;  acceptable for treatment today.   # severe neutropenia- from chemo/currently improved.  ANC-4.7.   # follow up in 8 weeks/rituximab infusion/labs/ PET scan prior. Labs in 4 weeks only.

## 2016-08-15 NOTE — Progress Notes (Signed)
Patient here today for follow up.  Patient states no new concerns today  

## 2016-08-15 NOTE — Progress Notes (Signed)
[place] NOTE  Patient Care Team: Coral Spikes, DO as PCP - General (Family Medicine) Jackolyn Confer, MD (Internal Medicine)  CHIEF COMPLAINTS/PURPOSE OF CONSULTATION: [place]  Oncology History   # JAN 2017- MANTLE CELL LYMPHOMA STAGE IV; [R Breast LN [place] Core Bx-1.2cm LN/R Ax LN-Bx]; cyclin D Pos; Mitotic rate-LOW; MIPI score [5/intermediate risk]; BMBx-Positive for involvement. Feb 9th- START Benda-Ritux with neulasta; Prolonged neutropenia; DISCONT- Benda-Ritux;   # April 13 th 2017- START R-CHOP x1; severe/prolonged neutropenia; PET- CR; BMBx-Neg; Disc R-CHOP  # 26th MAY 2017- Start Rituxan q 70M Main OCT 12th 2017- PET NED.   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  female with Above history of stage IV mantle cell lymphoma currently on maintenance Rituxan [since May 2017] is here for follow-up. Last rituximab infusion was approximately 8 weeks ago.  Denies any shortness of breath or cough. No skin rash. Denies any swelling in the legs. Denies any new lumps or bumps. No night sweats. No tingling or numbness. No recent infections.  ROS: A complete 10 point review of system is done which is negative except mentioned above in history of present illness  MEDICAL HISTORY:  Past Medical History:  Diagnosis Date  . Arthritis   . GERD (gastroesophageal reflux disease)   . Headache(784.0)   . History of methotrexate therapy   . Hyperlipidemia    hx  . Lymphadenopathy of head and neck 01/2015   see on Thyroid ultrasound  . Lymphoma, mantle cell ([place]) 06/01/2015   bx of lymph node in right breast  . Rheumatoid arthritis ([place])     SURGICAL HISTORY: Past Surgical History:  Procedure Laterality Date  . CARDIAC CATHETERIZATION  09/2004   [facility]; EF 60%  . CARDIAC CATHETERIZATION  08/2004    [facility]  . PERIPHERAL VASCULAR CATHETERIZATION N/A 07/04/2015   Procedure: [name] Insertion;  Surgeon: [name], MD;  Location: [facility];  Service: Cardiovascular;  Laterality: N/A;    SOCIAL HISTORY: Social History   Social History  . Marital status: Married    Spouse name: N/A  . Number of children: N/A  . Years of education: N/A   Occupational History  . Not on file.   Social History Main Topics  . Smoking status: Never Smoker  . Smokeless tobacco: Never Used  . Alcohol use No  . Drug use: No  . Sexual activity: Not on file   Other Topics Concern  . 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    FAMILY HISTORY: Family History  Problem Relation Age of Onset  . Diabetes Mother   . Cholelithiasis Mother   . Hypertension Sister   . Diabetes Sister   . Arthritis Brother     ALLERGIES:  has No Known Allergies.  MEDICATIONS:  Current Outpatient Prescriptions  Medication Sig Dispense Refill  . acetaminophen (TYLENOL) 500 MG tablet Take 500 mg by mouth every 6 (six) hours as needed.    . Calcium Carbonate-Vit D-Min (CALTRATE 600+D PLUS) 600-800 MG-UNIT CHEW Chew 1 tablet by mouth 2 (two) times daily. 60 tablet 6  . cyanocobalamin 500 MCG tablet Take 500 mcg by mouth  daily. Take two gummies daily    . fluticasone (FLONASE) 50 MCG/ACT nasal spray SPRAY TWICE IN EACH NOSTRIL ONCE DAILY  4  . hydroxychloroquine (PLAQUENIL) 200 MG tablet TAKE 2 TABLETS (400 MG TOTAL) BY MOUTH DAILY. 180 tablet 0  . lidocaine-prilocaine (EMLA) cream Apply 1 application topically as needed. 30 g 11  . Multiple Vitamins-Minerals (MULTIVITAMIN GUMMIES ADULT) CHEW Chew by mouth. Reported on 09/06/2015    . omeprazole (PRILOSEC) 40 MG capsule Take 40 mg by mouth daily.     [name] PREVIDENT 0.2 % SOLN RINSE AFTER THOROUGH BRUSHING  10  . meclizine (ANTIVERT) 25 MG tablet Take 25 mg by mouth 3 (three) times  daily as needed for dizziness.    . ondansetron (ZOFRAN) 8 MG tablet Take 1 tablet (8 mg total) by mouth every 8 (eight) hours as needed for nausea or vomiting (start 3 days; after chemo). (Patient not taking: Reported on 08/15/2016) 40 tablet 0  . prochlorperazine (COMPAZINE) 10 MG tablet Take 1 tablet (10 mg total) by mouth every 6 (six) hours as needed for nausea or vomiting. (Patient not taking: Reported on 08/15/2016) 30 tablet 0   No current facility-administered medications for this visit.    Facility-Administered Medications Ordered in Other Visits  Medication Dose Route Frequency Provider Last Rate Last Dose  . Tbo-Filgrastim (GRANIX) injection 480 mcg  480 mcg Subcutaneous Once Cammie Sickle, MD          .  PHYSICAL EXAMINATION: ECOG PERFORMANCE STATUS: 0 - Asymptomatic  Vitals:   08/15/16 0840  BP: 113/64  Pulse: 68  Resp: 18  Temp: (!) 96.7 F (35.9 C)   Filed Weights   08/15/16 0840  Weight: 249 lb (112.9 kg)    GENERAL: Well-nourished well-developed; Alert, no distress and comfortable. Accompanied by her family.  EYES: no pallor or icterus OROPHARYNX: no thrush or ulceration;  NECK: supple, no masses felt LYMPH:  lymphadenopathy in the cervical, axillary/inguinal lymph nodes resolved. LUNGS: clear to auscultation and No wheeze or crackles HEART/CVS: regular rate & rhythm and no murmurs; No lower extremity edema ABDOMEN: abdomen soft, non-tender and normal bowel sounds; no hepato-splenomegaly.  Musculoskeletal:no cyanosis of digits and no clubbing;  PSYCH: alert & oriented x 3 with fluent speech NEURO: no focal motor/sensory deficits SKIN: no rashes or significant lesions  LABORATORY DATA:  I have reviewed the data as listed Lab Results  Component Value Date   WBC 5.8 08/15/2016   HGB 13.2 08/15/2016   HCT 39.2 08/15/2016   MCV 88.3 08/15/2016   PLT 179 08/15/2016    Recent Labs  04/17/16 0918 05/15/16 0935 06/12/16 0830  08/15/16 0805  NA 137 139 140 139  K 3.7 4.2 3.8 4.0  CL 106 106 109 106  CO2 25 29 26 29   GLUCOSE 114* 97 99 96  BUN 18 14 13 18   CREATININE 0.69 0.78 0.72 0.79  CALCIUM 8.6* 8.8* 8.7* 8.8*  GFRNONAA >60 >60 >60 >60  GFRAA >60 >60 >60 >60  PROT 6.8  --  6.6 6.7  ALBUMIN 3.8  --  3.7 3.7  AST 25  --  24 23  ALT 13*  --  13* 12*  ALKPHOS 87  --  82 79  BILITOT 0.6  --  0.6 0.6   IMPRESSION: No evidence for hypermetabolic lymph nodes in the neck, chest, abdomen, or pelvis.  No new or progressive findings.   Electronically Signed   By: Misty Stanley M.D.   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 continue with maintenance Rituximab every 8 weeks; Tolerating Rituxan very well. Labs today reviewed;  acceptable for treatment today.   # severe neutropenia- from chemo/currently improved.  ANC-4.7.   # follow up in 8 weeks/rituximab infusion/labs/ PET scan prior. Labs in 4 weeks only.    Cammie Sickle, MD 08/16/2016 11:07 AM

## 2016-09-12 ENCOUNTER — Inpatient Hospital Stay: Payer: Medicare HMO | Attending: Internal Medicine

## 2016-09-12 DIAGNOSIS — C8311 Mantle cell lymphoma, lymph nodes of head, face, and neck: Secondary | ICD-10-CM | POA: Diagnosis not present

## 2016-09-12 LAB — BASIC METABOLIC PANEL
Anion gap: 5 (ref 5–15)
BUN: 15 mg/dL (ref 6–20)
CO2: 27 mmol/L (ref 22–32)
Calcium: 8.8 mg/dL — ABNORMAL LOW (ref 8.9–10.3)
Chloride: 108 mmol/L (ref 101–111)
Creatinine, Ser: 0.67 mg/dL (ref 0.44–1.00)
GFR calc Af Amer: >60 mL/min (ref >60)
GFR calc non Af Amer: >60 mL/min (ref >60)
Glucose, Bld: 90 mg/dL (ref 65–99)
Potassium: 3.7 mmol/L (ref 3.5–5.1)
Sodium: 140 mmol/L (ref 135–145)

## 2016-09-12 LAB — CBC WITH DIFFERENTIAL/PLATELET
Basophils Absolute: 0 10*3/uL (ref 0–0.1)
Basophils Relative: 0 %
Eosinophils Absolute: 0.1 10*3/uL (ref 0–0.7)
Eosinophils Relative: 2 %
HCT: 38.7 % (ref 35.0–47.0)
Hemoglobin: 13 g/dL (ref 12.0–16.0)
Lymphocytes Relative: 19 %
Lymphs Abs: 0.9 10*3/uL — ABNORMAL LOW (ref 1.0–3.6)
MCH: 29.7 pg (ref 26.0–34.0)
MCHC: 33.6 g/dL (ref 32.0–36.0)
MCV: 88.3 fL (ref 80.0–100.0)
Monocytes Absolute: 0.4 10*3/uL (ref 0.2–0.9)
Monocytes Relative: 8 %
Neutro Abs: 3.3 10*3/uL (ref 1.4–6.5)
Neutrophils Relative %: 71 %
Platelets: 153 10*3/uL (ref 150–440)
RBC: 4.39 MIL/uL (ref 3.80–5.20)
RDW: 15.4 % — ABNORMAL HIGH (ref 11.5–14.5)
WBC: 4.6 10*3/uL (ref 3.6–11.0)

## 2016-10-06 ENCOUNTER — Ambulatory Visit: Payer: Medicare HMO

## 2016-10-09 ENCOUNTER — Encounter
Admission: RE | Admit: 2016-10-09 | Discharge: 2016-10-09 | Disposition: A | Discharge status: Home / Self Care | Payer: Medicare HMO | Source: Ambulatory Visit | Attending: Internal Medicine | Admitting: Internal Medicine

## 2016-10-09 DIAGNOSIS — C8311 Mantle cell lymphoma, lymph nodes of head, face, and neck: Secondary | ICD-10-CM

## 2016-10-09 LAB — GLUCOSE, CAPILLARY: Glucose-Capillary: 84 mg/dL (ref 65–99)

## 2016-10-09 MED ORDER — FLUDEOXYGLUCOSE F - 18 (FDG) INJECTION
12.0000 | Freq: Once | INTRAVENOUS | Status: AC | PRN
  Administered 2016-10-09: 12.35 via INTRAVENOUS

## 2016-10-10 ENCOUNTER — Inpatient Hospital Stay: Payer: Medicare HMO | Attending: Internal Medicine

## 2016-10-10 ENCOUNTER — Inpatient Hospital Stay: Payer: Medicare HMO

## 2016-10-10 ENCOUNTER — Inpatient Hospital Stay (HOSPITAL_BASED_OUTPATIENT_CLINIC_OR_DEPARTMENT_OTHER): Payer: Medicare HMO | Admitting: Internal Medicine

## 2016-10-10 VITALS — BP 133/81 | HR 66 | Temp 96.7°F | Resp 18

## 2016-10-10 VITALS — BP 116/72 | HR 67 | Temp 97.3°F | Resp 16 | Wt 248.4 lb

## 2016-10-10 DIAGNOSIS — K219 Gastro-esophageal reflux disease without esophagitis: Secondary | ICD-10-CM | POA: Diagnosis not present

## 2016-10-10 DIAGNOSIS — D709 Neutropenia, unspecified: Secondary | ICD-10-CM

## 2016-10-10 DIAGNOSIS — C8311 Mantle cell lymphoma, lymph nodes of head, face, and neck: Secondary | ICD-10-CM

## 2016-10-10 DIAGNOSIS — Z79899 Other long term (current) drug therapy: Secondary | ICD-10-CM | POA: Insufficient documentation

## 2016-10-10 DIAGNOSIS — M25511 Pain in right shoulder: Secondary | ICD-10-CM | POA: Diagnosis not present

## 2016-10-10 DIAGNOSIS — E785 Hyperlipidemia, unspecified: Secondary | ICD-10-CM

## 2016-10-10 DIAGNOSIS — M069 Rheumatoid arthritis, unspecified: Secondary | ICD-10-CM | POA: Diagnosis not present

## 2016-10-10 DIAGNOSIS — Z5112 Encounter for antineoplastic immunotherapy: Secondary | ICD-10-CM | POA: Diagnosis not present

## 2016-10-10 DIAGNOSIS — M25512 Pain in left shoulder: Secondary | ICD-10-CM | POA: Insufficient documentation

## 2016-10-10 DIAGNOSIS — I700 Atherosclerosis of aorta: Secondary | ICD-10-CM | POA: Diagnosis not present

## 2016-10-10 LAB — COMPREHENSIVE METABOLIC PANEL
ALT: 14 U/L (ref 14–54)
AST: 24 U/L (ref 15–41)
Albumin: 3.9 g/dL (ref 3.5–5.0)
Alkaline Phosphatase: 77 U/L (ref 38–126)
Anion gap: 3 — ABNORMAL LOW (ref 5–15)
BUN: 14 mg/dL (ref 6–20)
CO2: 27 mmol/L (ref 22–32)
Calcium: 8.9 mg/dL (ref 8.9–10.3)
Chloride: 108 mmol/L (ref 101–111)
Creatinine, Ser: 0.78 mg/dL (ref 0.44–1.00)
GFR calc Af Amer: >60 mL/min (ref >60)
GFR calc non Af Amer: >60 mL/min (ref >60)
Glucose, Bld: 94 mg/dL (ref 65–99)
Potassium: 3.7 mmol/L (ref 3.5–5.1)
Sodium: 138 mmol/L (ref 135–145)
Total Bilirubin: 0.8 mg/dL (ref 0.3–1.2)
Total Protein: 7 g/dL (ref 6.5–8.1)

## 2016-10-10 LAB — CBC WITH DIFFERENTIAL/PLATELET
Basophils Absolute: 0 10*3/uL (ref 0–0.1)
Basophils Relative: 1 %
Eosinophils Absolute: 0.1 10*3/uL (ref 0–0.7)
Eosinophils Relative: 1 %
HCT: 39.8 % (ref 35.0–47.0)
Hemoglobin: 13.5 g/dL (ref 12.0–16.0)
Lymphocytes Relative: 21 %
Lymphs Abs: 1.1 10*3/uL (ref 1.0–3.6)
MCH: 29.8 pg (ref 26.0–34.0)
MCHC: 33.9 g/dL (ref 32.0–36.0)
MCV: 88.1 fL (ref 80.0–100.0)
Monocytes Absolute: 0.3 10*3/uL (ref 0.2–0.9)
Monocytes Relative: 7 %
Neutro Abs: 3.7 10*3/uL (ref 1.4–6.5)
Neutrophils Relative %: 70 %
Platelets: 175 10*3/uL (ref 150–440)
RBC: 4.52 MIL/uL (ref 3.80–5.20)
RDW: 15.8 % — ABNORMAL HIGH (ref 11.5–14.5)
WBC: 5.2 10*3/uL (ref 3.6–11.0)

## 2016-10-10 LAB — LACTATE DEHYDROGENASE: LDH: 202 U/L — ABNORMAL HIGH (ref 98–192)

## 2016-10-10 MED ORDER — SODIUM CHLORIDE 0.9 % IV SOLN
800.0000 mg | Freq: Once | INTRAVENOUS | Status: AC
  Administered 2016-10-10: 800 mg via INTRAVENOUS
  Filled 2016-10-10: qty 50

## 2016-10-10 MED ORDER — SODIUM CHLORIDE 0.9 % IV SOLN: 375.0000 mg/m2 | Freq: Once | INTRAVENOUS | Status: DC

## 2016-10-10 MED ORDER — SODIUM CHLORIDE 0.9% FLUSH
10.0000 mL | Freq: Once | INTRAVENOUS | Status: AC
  Administered 2016-10-10: 10 mL via INTRAVENOUS
  Filled 2016-10-10: qty 10

## 2016-10-10 MED ORDER — DIPHENHYDRAMINE HCL 25 MG PO CAPS
50.0000 mg | ORAL_CAPSULE | Freq: Once | ORAL | Status: AC
  Administered 2016-10-10: 50 mg via ORAL
  Filled 2016-10-10: qty 2

## 2016-10-10 MED ORDER — HEPARIN SOD (PORK) LOCK FLUSH 100 UNIT/ML IV SOLN
500.0000 [IU] | Freq: Once | INTRAVENOUS | Status: AC
Start: 2016-10-10 — End: 2016-10-10
  Administered 2016-10-10: 500 [IU] via INTRAVENOUS
  Filled 2016-10-10: qty 5

## 2016-10-10 MED ORDER — ACETAMINOPHEN 325 MG PO TABS
650.0000 mg | ORAL_TABLET | Freq: Once | ORAL | Status: AC
  Administered 2016-10-10: 650 mg via ORAL
  Filled 2016-10-10: qty 2

## 2016-10-10 MED ORDER — SODIUM CHLORIDE 0.9 % IV SOLN
Freq: Once | INTRAVENOUS | Status: AC
  Administered 2016-10-10: 10:00:00 via INTRAVENOUS
  Filled 2016-10-10: qty 1000

## 2016-10-10 NOTE — Progress Notes (Signed)
Patient here today for follow up.   

## 2016-10-10 NOTE — Assessment & Plan Note (Addendum)
#   MANTLE CELL LYMPHOMA- STAGE IV-poor tolerance to chemotherapy sec to severe neutropenia [R-benda x1 & R-CHOP x1]-  Currently on maintenance Rituxan since May 2017; MAY 10th 2018- PET NED [no evidence of any recurrence as progression of disease.]   # Clinically doing well; no concerns of progression. continue with maintenance Rituximab every 8 weeks; Tolerating Rituxan very well. Labs today reviewed;  acceptable for treatment today.   # Hx severe neutropenia- from chemo/currently improved.  ANC-4.7.   # Bil shoulder arthropathy/rotator cuff discussed With pt but she is reluctant re: referral to ortho; defer to PCP  # follow up in 8 weeks/rituximab infusion/labs/port flush.   # I reviewed the blood work- with the patient in detail; also reviewed the imaging independently [as summarized above]; and with the patient in detail.   Cc:Dr.Cook.

## 2016-10-10 NOTE — Progress Notes (Signed)
[place] NOTE  Patient Care Team: Coral Spikes, DO as PCP - General (Family Medicine) Jackolyn Confer, MD (Internal Medicine)  CHIEF COMPLAINTS/PURPOSE OF CONSULTATION: MANTLE CELL LYMPHOMA  Oncology History   # JAN 2017- MANTLE CELL LYMPHOMA STAGE IV; [R Breast LN [place] Core Bx-1.2cm LN/R Ax LN-Bx]; cyclin D Pos; Mitotic rate-LOW; MIPI score [5/intermediate risk]; BMBx-Positive for involvement. Feb 9th- START Benda-Ritux with neulasta; Prolonged neutropenia; DISCONT- Benda-Ritux;   # April 13 th 2017- START R-CHOP x1; severe/prolonged neutropenia; PET- CR; BMBx-Neg; Disc R-CHOP  # 26th MAY 2017- Start Rituxan q 64M Main OCT 12th 2017- PET NED.   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  female with Above history of stage IV mantle cell lymphoma currently on maintenance Rituxan [since May 2017] is here for follow-up. Last rituximab infusion was approximately 8 weeks ago.; Is here to review the results of her PET scan.  No headaches. Denies any shortness of breath or cough. No skin rash. Denies any swelling in the legs. Denies any new lumps or bumps. No night sweats. No tingling or numbness. No recent infections. Patient does complain of bilateral shoulder pain. ; Limitation of activity. However she is reluctant for a orthopedic referral because of financial issues.  ROS: A complete 10 point review of system is done which is negative except mentioned above in history of present illness  MEDICAL HISTORY:  Past Medical History:  Diagnosis Date  . Arthritis   . GERD (gastroesophageal reflux disease)   . Headache(784.0)   . History of methotrexate therapy   . Hyperlipidemia    hx  . Lymphadenopathy of head and neck 01/2015   see on Thyroid ultrasound  . Lymphoma, mantle cell ([place]) 06/01/2015   bx of lymph  node in right breast  . Rheumatoid arthritis ([place])     SURGICAL HISTORY: Past Surgical History:  Procedure Laterality Date  . CARDIAC CATHETERIZATION  09/2004   [facility]; EF 60%  . CARDIAC CATHETERIZATION  08/2004   [facility]  . PERIPHERAL VASCULAR CATHETERIZATION N/A 07/04/2015   Procedure: [name] Insertion;  Surgeon: [name], MD;  Location: [facility];  Service: Cardiovascular;  Laterality: N/A;    SOCIAL HISTORY: Social History   Social History  . Marital status: Married    Spouse name: N/A  . Number of children: N/A  . Years of education: N/A   Occupational History  . Not on file.   Social History Main Topics  . Smoking status: Never Smoker  . Smokeless tobacco: Never Used  . Alcohol use No  . Drug use: No  . Sexual activity: Not on file   Other Topics Concern  . 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    FAMILY HISTORY: Family History  Problem Relation Age of Onset  . Diabetes Mother   . Cholelithiasis Mother   . Hypertension Sister   . Diabetes Sister   . Arthritis Brother     ALLERGIES:  has No Known Allergies.  MEDICATIONS:  Current Outpatient Prescriptions  Medication Sig Dispense Refill  . acetaminophen (TYLENOL) 500 MG tablet Take 500 mg by mouth every 6 (six) hours as needed.    [name]  Calcium Carbonate-Vit D-Min (CALTRATE 600+D PLUS) 600-800 MG-UNIT CHEW Chew 1 tablet by mouth 2 (two) times daily. 60 tablet 6  . cyanocobalamin 500 MCG tablet Take 500 mcg by mouth daily. Take two gummies daily    . fluticasone (FLONASE) 50 MCG/ACT nasal spray SPRAY TWICE IN EACH NOSTRIL ONCE DAILY  4  . hydroxychloroquine (PLAQUENIL) 200 MG tablet TAKE 2 TABLETS (400 MG TOTAL) BY MOUTH DAILY. 180 tablet 0  . lidocaine-prilocaine (EMLA) cream Apply 1 application topically as needed. 30 g 11  . meclizine (ANTIVERT) 25 MG tablet Take 25 mg by mouth 3 (three) times daily as  needed for dizziness.    . Multiple Vitamins-Minerals (MULTIVITAMIN GUMMIES ADULT) CHEW Chew by mouth. Reported on 09/06/2015    . omeprazole (PRILOSEC) 40 MG capsule Take 40 mg by mouth daily.     [name] PREVIDENT 0.2 % SOLN RINSE AFTER THOROUGH BRUSHING  10  . ondansetron (ZOFRAN) 8 MG tablet Take 1 tablet (8 mg total) by mouth every 8 (eight) hours as needed for nausea or vomiting (start 3 days; after chemo). (Patient not taking: Reported on 08/15/2016) 40 tablet 0  . prochlorperazine (COMPAZINE) 10 MG tablet Take 1 tablet (10 mg total) by mouth every 6 (six) hours as needed for nausea or vomiting. (Patient not taking: Reported on 08/15/2016) 30 tablet 0   No current facility-administered medications for this visit.    Facility-Administered Medications Ordered in Other Visits  Medication Dose Route Frequency Provider Last Rate Last Dose  . Tbo-Filgrastim (GRANIX) injection 480 mcg  480 mcg Subcutaneous Once Cammie Sickle, MD          .  PHYSICAL EXAMINATION: ECOG PERFORMANCE STATUS: 0 - Asymptomatic  Vitals:   10/10/16 0843  BP: 116/72  Pulse: 67  Resp: 16  Temp: 97.3 F (36.3 C)   Filed Weights   10/10/16 0843  Weight: 248 lb 6 oz (112.7 kg)    GENERAL: Well-nourished well-developed; Alert, no distress and comfortable. Accompanied by her family.  EYES: no pallor or icterus OROPHARYNX: no thrush or ulceration;  NECK: supple, no masses felt LYMPH:  lymphadenopathy in the cervical, axillary/inguinal lymph nodes resolved. LUNGS: clear to auscultation and No wheeze or crackles HEART/CVS: regular rate & rhythm and no murmurs; No lower extremity edema ABDOMEN: abdomen soft, non-tender and normal bowel sounds; no hepato-splenomegaly.  Musculoskeletal:no cyanosis of digits and no clubbing;  PSYCH: alert & oriented x 3 with fluent speech NEURO: no focal motor/sensory deficits SKIN: no rashes or significant lesions  LABORATORY DATA:  I have reviewed the data as  listed Lab Results  Component Value Date   WBC 5.2 10/10/2016   HGB 13.5 10/10/2016   HCT 39.8 10/10/2016   MCV 88.1 10/10/2016   PLT 175 10/10/2016    Recent Labs  06/12/16 0830 08/15/16 0805 09/12/16 0802 10/10/16 0821  NA 140 139 140 138  K 3.8 4.0 3.7 3.7  CL 109 106 108 108  CO2 26 29 27 27   GLUCOSE 99 96 90 94  BUN 13 18 15 14   CREATININE 0.72 0.79 0.67 0.78  CALCIUM 8.7* 8.8* 8.8* 8.9  GFRNONAA >60 >60 >60 >60  GFRAA >60 >60 >60 >60  PROT 6.6 6.7  --  7.0  ALBUMIN 3.7 3.7  --  3.9  AST 24 23  --  24  ALT 13* 12*  --  14  ALKPHOS 82 79  --  77  BILITOT 0.6 0.6  --  0.8   IMPRESSION: 1.  No findings of recurrent lymphoma. 2.  Aortic Atherosclerosis (ICD10-I70.0). 3. 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   Electronically Signed   By: Van Clines M.D.   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 continue with maintenance Rituximab every 8 weeks; Tolerating Rituxan very well. Labs today reviewed;  acceptable for treatment today.   # Hx severe neutropenia- from chemo/currently improved.  ANC-4.7.   # Bil shoulder arthropathy/rotator cuff discussed With pt but she is reluctant re: referral to ortho; defer to PCP  # follow up in 8 weeks/rituximab infusion/labs/port flush.   # I reviewed the blood work- with the patient in detail; also reviewed the imaging independently [as summarized above]; and with the patient in detail.   Cc:Dr.Cook.      Cammie Sickle, MD 10/14/2016 4:51 PM

## 2016-11-07 ENCOUNTER — Ambulatory Visit (INDEPENDENT_AMBULATORY_CARE_PROVIDER_SITE_OTHER): Payer: Medicare HMO

## 2016-11-07 VITALS — BP 118/70 | HR 64 | Temp 97.0°F | Resp 14 | Ht 66.0 in | Wt 254.4 lb

## 2016-11-07 DIAGNOSIS — Z0000 Encounter for general adult medical examination without abnormal findings: Secondary | ICD-10-CM

## 2016-11-07 NOTE — Patient Instructions (Addendum)
Michelle [name] , Thank you for taking time to come for your Medicare Wellness Visit. I appreciate your ongoing commitment to your health goals. Please review the following plan we discussed and let me know if I can assist you in the future.   Follow up with Dr. Lacinda Axon as needed.    Bring a copy of your Loyal and/or Living Will to be scanned into chart once completed.  Challenge you brain with exercises:  Reading, puzzles, games  Have a great day!  These are the goals we discussed: Goals    . Increase lean proteins          Low carb foods    . Increase physical activity          Stretch! Chair exercises.       This is a list of the screening recommended for you and due dates:  Health Maintenance  Topic Date Due  . Flu Shot  12/31/2016  . Mammogram  05/31/2017  . Tetanus Vaccine  11/13/2019  . Colon Cancer Screening  11/12/2020  . DEXA scan (bone density measurement)  Completed  . Pneumonia vaccines  Completed    Fall Prevention in the Home Falls can cause injuries. They can happen to people of all ages. There are many things you can do to make your home safe and to help prevent falls. What can I do on the outside of my home?  Regularly fix the edges of walkways and driveways and fix any cracks.  Remove anything that might make you trip as you walk through a door, such as a raised step or threshold.  Trim any bushes or trees on the path to your home.  Use bright outdoor lighting.  Clear any walking paths of anything that might make someone trip, such as rocks or tools.  Regularly check to see if handrails are loose or broken. Make sure that both sides of any steps have handrails.  Any raised decks and porches should have guardrails on the edges.  Have any leaves, snow, or ice cleared regularly.  Use sand or salt on walking paths during winter.  Clean up any spills in your garage right away. This includes oil or grease spills. What can I do  in the bathroom?  Use night lights.  Install grab bars by the toilet and in the tub and shower. Do not use towel bars as grab bars.  Use non-skid mats or decals in the tub or shower.  If you need to sit down in the shower, use a plastic, non-slip stool.  Keep the floor dry. Clean up any water that spills on the floor as soon as it happens.  Remove soap buildup in the tub or shower regularly.  Attach bath mats securely with double-sided non-slip rug tape.  Do not have throw rugs and other things on the floor that can make you trip. What can I do in the bedroom?  Use night lights.  Make sure that you have a light by your bed that is easy to reach.  Do not use any sheets or blankets that are too big for your bed. They should not hang down onto the floor.  Have a firm chair that has side arms. You can use this for support while you get dressed.  Do not have throw rugs and other things on the floor that can make you trip. What can I do in the kitchen?  Clean up any spills right away.  Avoid walking on wet floors.  Keep items that you use a lot in easy-to-reach places.  If you need to reach something above you, use a strong step stool that has a grab bar.  Keep electrical cords out of the way.  Do not use floor polish or wax that makes floors slippery. If you must use wax, use non-skid floor wax.  Do not have throw rugs and other things on the floor that can make you trip. What can I do with my stairs?  Do not leave any items on the stairs.  Make sure that there are handrails on both sides of the stairs and use them. Fix handrails that are broken or loose. Make sure that handrails are as long as the stairways.  Check any carpeting to make sure that it is firmly attached to the stairs. Fix any carpet that is loose or worn.  Avoid having throw rugs at the top or bottom of the stairs. If you do have throw rugs, attach them to the floor with carpet tape.  Make sure that you  have a light switch at the top of the stairs and the bottom of the stairs. If you do not have them, ask someone to add them for you. What else can I do to help prevent falls?  Wear shoes that: ? Do not have high heels. ? Have rubber bottoms. ? Are comfortable and fit you well. ? Are closed at the toe. Do not wear sandals.  If you use a stepladder: ? Make sure that it is fully opened. Do not climb a closed stepladder. ? Make sure that both sides of the stepladder are locked into place. ? Ask someone to hold it for you, if possible.  Clearly mark and make sure that you can see: ? Any grab bars or handrails. ? First and last steps. ? Where the edge of each step is.  Use tools that help you move around (mobility aids) if they are needed. These include: ? Canes. ? Walkers. ? Scooters. ? Crutches.  Turn on the lights when you go into a dark area. Replace any light bulbs as soon as they burn out.  Set up your furniture so you have a clear path. Avoid moving your furniture around.  If any of your floors are uneven, fix them.  If there are any pets around you, be aware of where they are.  Review your medicines with your doctor. Some medicines can make you feel dizzy. This can increase your chance of falling. Ask your doctor what other things that you can do to help prevent falls. This information is not intended to replace advice given to you by your health care provider. Make sure you discuss any questions you have with your health care provider. Document Released: 03/15/2009 Document Revised: 10/25/2015 Document Reviewed: 06/23/2014 Elsevier Interactive Patient Education  [employer].

## 2016-11-07 NOTE — Progress Notes (Signed)
Subjective:   Michelle [name] is a 76 y.o. female who presents for Medicare Annual (Subsequent) preventive examination.  Review of Systems:  No ROS.  Medicare Wellness Visit.  Cardiac Risk Factors include: advanced age (>40men, >40 women);obesity (BMI >30kg/m2)     Objective:     Vitals: BP 118/70 (BP Location: Left Arm, Patient Position: Sitting, Cuff Size: Normal)   Pulse 64   Temp 97 F (36.1 C) (Oral)   Resp 14   Ht 5\' 6"  (1.676 m)   Wt 254 lb 6.4 oz (115.4 kg)   SpO2 97%   BMI 41.06 kg/m   Body mass index is 41.06 kg/m.   Tobacco History  Smoking Status  . Never Smoker  Smokeless Tobacco  . Never Used     Counseling given: Not Answered   Past Medical History:  Diagnosis Date  . Arthritis   . GERD (gastroesophageal reflux disease)   . Headache(784.0)   . History of methotrexate therapy   . Hyperlipidemia    hx  . Lymphadenopathy of head and neck 01/2015   see on Thyroid ultrasound  . Lymphoma, mantle cell ([place]) 06/01/2015   bx of lymph node in right breast  . Rheumatoid arthritis [facility])    Past Surgical History:  Procedure Laterality Date  . CARDIAC CATHETERIZATION  09/2004   [facility]; EF 60%  . CARDIAC CATHETERIZATION  08/2004   [facility]  . PERIPHERAL VASCULAR CATHETERIZATION N/A 07/04/2015   Procedure: [name] Insertion;  Surgeon: [name], MD;  Location: [place] [facility];  Service: Cardiovascular;  Laterality: N/A;   Family History  Problem Relation Age of Onset  . Diabetes Mother   . Cholelithiasis Mother   . Hypertension Sister   . Diabetes Sister   . Arthritis Brother    History  Sexual Activity  . Sexual activity: No    Outpatient Encounter Prescriptions as of 11/07/2016  Medication Sig  . acetaminophen (TYLENOL) 500 MG tablet Take 500 mg by mouth every 6 (six) hours as needed.  . Calcium Carbonate-Vit D-Min (CALTRATE 600+D PLUS) 600-800 MG-UNIT CHEW Chew 1 tablet by mouth 2 (two) times daily.  . cyanocobalamin 500 MCG tablet Take  500 mcg by mouth daily. Take two gummies daily  . fluticasone (FLONASE) 50 MCG/ACT nasal spray SPRAY TWICE IN EACH NOSTRIL ONCE DAILY  . hydroxychloroquine (PLAQUENIL) 200 MG tablet TAKE 2 TABLETS (400 MG TOTAL) BY MOUTH DAILY.  [name] lidocaine-prilocaine (EMLA) cream Apply 1 application topically as needed.  . meclizine (ANTIVERT) 25 MG tablet Take 25 mg by mouth 3 (three) times daily as needed for dizziness.  . Multiple Vitamins-Minerals (MULTIVITAMIN GUMMIES ADULT) CHEW Chew by mouth. Reported on 09/06/2015  . omeprazole (PRILOSEC) 40 MG capsule Take 40 mg by mouth daily.   . ondansetron (ZOFRAN) 8 MG tablet Take 1 tablet (8 mg total) by mouth every 8 (eight) hours as needed for nausea or vomiting (start 3 days; after chemo).  [name] oxyCODONE (OXY IR/ROXICODONE) 5 MG immediate release tablet Take 5 mg by mouth every 4 (four) hours as needed for severe pain.  [name] PREVIDENT 0.2 % SOLN RINSE AFTER THOROUGH BRUSHING  . prochlorperazine (COMPAZINE) 10 MG tablet Take 1 tablet (10 mg total) by mouth every 6 (six) hours as needed for nausea or vomiting.   Facility-Administered Encounter Medications as of 11/07/2016  Medication  . Tbo-Filgrastim (GRANIX) injection 480 mcg    Activities of Daily Living In your present state of health, do you have any difficulty  performing the following activities: 11/07/2016  Hearing? N  Vision? N  Difficulty concentrating or making decisions? Y  Walking or climbing stairs? Y  Dressing or bathing? N  Doing errands, shopping? N  Preparing Food and eating ? N  Using the Toilet? N  In the past six months, have you accidently leaked urine? N  Do you have problems with loss of bowel control? N  Managing your Medications? N  Managing your Finances? N  Housekeeping or managing your Housekeeping? N  Some recent data might be hidden    Patient Care Team: Coral Spikes, DO as PCP - General (Family Medicine) Jackolyn Confer, MD (Internal Medicine)    Assessment:    This is  a routine wellness examination for [place]. The goal of the wellness visit is to assist the patient how to close the gaps in care and create a preventative care plan for the patient.   Taking calcium VIT D as appropriate/Osteoporosis reviewed.  Medications reviewed; taking without issues or barriers.  Safety issues reviewed; smoke detectors in the home. No firearms in the home.  Wears seatbelts when driving or riding with others. Patient does wear sunscreen or protective clothing when in direct sunlight. No violence in the home.  Depression- PHQ 2 &9 complete.  No signs/symptoms or verbal communication regarding little pleasure in doing things, feeling down, depressed or hopeless. No changes in sleeping, energy, eating, concentrating.  No thoughts of self harm or harm towards others.  Time spent on this topic is 8 minutes.   Patient is alert, normal appearance, oriented to person/place/and time. Correctly identified the president of the [place], recall of 3/3 words, and performing simple calculations.  Patient displays appropriate judgement and can read correct time from watch face.  No new identified risk were noted.  No failures at ADL's or IADL's.  Cane in use when ambulating.   BMI- discussed the importance of a healthy diet, water intake and exercise. Educational material provided.   Daily fluid intake: 1 cups of caffeine, 6 cups of water  Dental- every six months.    Eye- Visual acuity not assessed per patient preference since they have regular follow up with the ophthalmologist.  Wears corrective lenses.  Sleep patterns- Sleeps 4 hours at night. Naps during the day.  Health maintenance gaps- closed.  Patient Concerns: None at this time. Follow up with PCP as needed.  Exercise Activities and Dietary recommendations Current Exercise Habits: The patient does not participate in regular exercise at present  Goals    . Increase lean proteins          Low carb foods    .  Increase physical activity          Stretch! Chair exercises.      Fall Risk Fall Risk  11/07/2016 01/12/2014 08/19/2012  Falls in the past year? Yes No No  Number falls in past yr: 1 - -  Injury with Fall? Yes - -  Follow up Education provided;Falls prevention discussed - -   Depression Screen PHQ 2/9 Scores 11/07/2016 01/12/2014 08/19/2012  PHQ - 2 Score 0 2 0  PHQ- 9 Score 0 - -     Cognitive Function MMSE - Mini Mental State Exam 11/07/2016  Orientation to time 5  Orientation to Place 5  Registration 3  Attention/ Calculation 3  Attention/Calculation-comments Some difficulty calculating   Recall 3  Language- name 2 objects 2  Language- repeat 1  Language- follow 3 step command 3  Language- read & follow direction 1  Write a sentence 1  Copy design 1  Total score 28        Immunization History  Administered Date(s) Administered  . Influenza Split 02/18/2012  . Influenza,inj,Quad PF,36+ Mos 04/01/2013, 03/02/2014  . Influenza-Unspecified 03/14/2016  . Pneumococcal Conjugate-13 01/12/2014  . Pneumococcal Polysaccharide-23 11/12/2009  . Tdap 11/12/2009   Screening Tests Health Maintenance  Topic Date Due  . INFLUENZA VACCINE  12/31/2016  . MAMMOGRAM  05/31/2017  . TETANUS/TDAP  11/13/2019  . COLONOSCOPY  11/12/2020  . DEXA SCAN  Completed  . PNA vac Low Risk Adult  Completed      Plan:   End of life planning; Advanced aging; Advanced directives discussed.  No HCPOA/Living Will.  Additional information declined at this time.  I have personally reviewed and noted the following in the patient's chart:   . Medical and social history . Use of alcohol, tobacco or illicit drugs  . Current medications and supplements . Functional ability and status . Nutritional status . Physical activity . Advanced directives . List of other physicians . Hospitalizations, surgeries, and ER visits in previous 12 months . Vitals . Screenings to include cognitive, depression, and  falls . Referrals and appointments  In addition, I have reviewed and discussed with patient certain preventive protocols, quality metrics, and best practice recommendations. A written personalized care plan for preventive services as well as general preventive health recommendations were provided to patient.     Varney Biles, LPN  10/01/8982

## 2016-12-19 ENCOUNTER — Inpatient Hospital Stay (HOSPITAL_BASED_OUTPATIENT_CLINIC_OR_DEPARTMENT_OTHER): Payer: Medicare HMO | Admitting: Internal Medicine

## 2016-12-19 ENCOUNTER — Inpatient Hospital Stay: Payer: Medicare HMO

## 2016-12-19 ENCOUNTER — Inpatient Hospital Stay: Payer: Medicare HMO | Attending: Internal Medicine

## 2016-12-19 VITALS — BP 154/84 | HR 73 | Temp 97.6°F | Resp 18

## 2016-12-19 DIAGNOSIS — Z79899 Other long term (current) drug therapy: Secondary | ICD-10-CM

## 2016-12-19 DIAGNOSIS — K219 Gastro-esophageal reflux disease without esophagitis: Secondary | ICD-10-CM | POA: Insufficient documentation

## 2016-12-19 DIAGNOSIS — M069 Rheumatoid arthritis, unspecified: Secondary | ICD-10-CM | POA: Diagnosis not present

## 2016-12-19 DIAGNOSIS — C8311 Mantle cell lymphoma, lymph nodes of head, face, and neck: Secondary | ICD-10-CM | POA: Diagnosis not present

## 2016-12-19 DIAGNOSIS — Z5111 Encounter for antineoplastic chemotherapy: Secondary | ICD-10-CM | POA: Diagnosis not present

## 2016-12-19 DIAGNOSIS — I700 Atherosclerosis of aorta: Secondary | ICD-10-CM | POA: Insufficient documentation

## 2016-12-19 DIAGNOSIS — E785 Hyperlipidemia, unspecified: Secondary | ICD-10-CM | POA: Insufficient documentation

## 2016-12-19 DIAGNOSIS — M25562 Pain in left knee: Secondary | ICD-10-CM

## 2016-12-19 LAB — CBC WITH DIFFERENTIAL/PLATELET
Basophils Absolute: 0 10*3/uL (ref 0–0.1)
Basophils Relative: 1 %
Eosinophils Absolute: 0.1 10*3/uL (ref 0–0.7)
Eosinophils Relative: 2 %
HCT: 39.8 % (ref 35.0–47.0)
Hemoglobin: 13.4 g/dL (ref 12.0–16.0)
Lymphocytes Relative: 18 %
Lymphs Abs: 1.1 10*3/uL (ref 1.0–3.6)
MCH: 29.9 pg (ref 26.0–34.0)
MCHC: 33.7 g/dL (ref 32.0–36.0)
MCV: 88.6 fL (ref 80.0–100.0)
Monocytes Absolute: 0.4 10*3/uL (ref 0.2–0.9)
Monocytes Relative: 6 %
Neutro Abs: 4.3 10*3/uL (ref 1.4–6.5)
Neutrophils Relative %: 73 %
Platelets: 168 10*3/uL (ref 150–440)
RBC: 4.49 MIL/uL (ref 3.80–5.20)
RDW: 14.8 % — ABNORMAL HIGH (ref 11.5–14.5)
WBC: 5.9 10*3/uL (ref 3.6–11.0)

## 2016-12-19 LAB — COMPREHENSIVE METABOLIC PANEL
ALT: 12 U/L — ABNORMAL LOW (ref 14–54)
AST: 23 U/L (ref 15–41)
Albumin: 3.9 g/dL (ref 3.5–5.0)
Alkaline Phosphatase: 80 U/L (ref 38–126)
Anion gap: 7 (ref 5–15)
BUN: 15 mg/dL (ref 6–20)
CO2: 26 mmol/L (ref 22–32)
Calcium: 9 mg/dL (ref 8.9–10.3)
Chloride: 106 mmol/L (ref 101–111)
Creatinine, Ser: 0.76 mg/dL (ref 0.44–1.00)
GFR calc Af Amer: >60 mL/min (ref >60)
GFR calc non Af Amer: >60 mL/min (ref >60)
Glucose, Bld: 93 mg/dL (ref 65–99)
Potassium: 4 mmol/L (ref 3.5–5.1)
Sodium: 139 mmol/L (ref 135–145)
Total Bilirubin: 0.7 mg/dL (ref 0.3–1.2)
Total Protein: 7 g/dL (ref 6.5–8.1)

## 2016-12-19 MED ORDER — RITUXIMAB CHEMO INJECTION 500 MG/50ML
800.0000 mg | Freq: Once | INTRAVENOUS | Status: AC
Start: 2016-12-19 — End: 2016-12-19
  Administered 2016-12-19: 800 mg via INTRAVENOUS
  Filled 2016-12-19: qty 50

## 2016-12-19 MED ORDER — HEPARIN SOD (PORK) LOCK FLUSH 100 UNIT/ML IV SOLN
500.0000 [IU] | Freq: Once | INTRAVENOUS | Status: AC
  Administered 2016-12-19: 500 [IU] via INTRAVENOUS
  Filled 2016-12-19: qty 5

## 2016-12-19 MED ORDER — SODIUM CHLORIDE 0.9% FLUSH
10.0000 mL | Freq: Once | INTRAVENOUS | Status: AC
  Administered 2016-12-19: 10 mL via INTRAVENOUS
  Filled 2016-12-19: qty 10

## 2016-12-19 MED ORDER — SODIUM CHLORIDE 0.9 % IV SOLN: 375.0000 mg/m2 | Freq: Once | INTRAVENOUS | Status: DC

## 2016-12-19 MED ORDER — ACETAMINOPHEN 325 MG PO TABS
650.0000 mg | ORAL_TABLET | Freq: Once | ORAL | Status: AC
  Administered 2016-12-19: 650 mg via ORAL
  Filled 2016-12-19: qty 2

## 2016-12-19 MED ORDER — SODIUM CHLORIDE 0.9 % IV SOLN
Freq: Once | INTRAVENOUS | Status: AC
  Administered 2016-12-19: 12:00:00 via INTRAVENOUS
  Filled 2016-12-19: qty 1000

## 2016-12-19 MED ORDER — DIPHENHYDRAMINE HCL 25 MG PO CAPS
50.0000 mg | ORAL_CAPSULE | Freq: Once | ORAL | Status: AC
Start: 2016-12-19 — End: 2016-12-19
  Administered 2016-12-19: 50 mg via ORAL
  Filled 2016-12-19: qty 2

## 2016-12-19 NOTE — Assessment & Plan Note (Addendum)
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 continue with maintenance Rituximab every 8 weeks; Tolerating Rituxan very well. Labs today reviewed;  acceptable for treatment today. Discussed that maintenance could potentially be done for at least 2 years or more.  # left knee pain- likely arthritis. Declines referral to orthopedics.   # follow up in 8 weeks/rituximab infusion/labs/port flush.     Cc:Dr.Cook.

## 2016-12-19 NOTE — Progress Notes (Signed)
[place] NOTE  Patient Care Team: Coral Spikes, DO as PCP - General (Family Medicine) Jackolyn Confer, MD (Internal Medicine)  CHIEF COMPLAINTS/PURPOSE OF CONSULTATION: MANTLE CELL LYMPHOMA  Oncology History   # JAN 2017- MANTLE CELL LYMPHOMA STAGE IV; [R Breast LN [place] Core Bx-1.2cm LN/R Ax LN-Bx]; cyclin D Pos; Mitotic rate-LOW; MIPI score [5/intermediate risk]; BMBx-Positive for involvement. Feb 9th- START Benda-Ritux with neulasta; Prolonged neutropenia; DISCONT- Benda-Ritux;   # April 13 th 2017- START R-CHOP x1; severe/prolonged neutropenia; PET- CR; BMBx-Neg; Disc R-CHOP  # 26th MAY 2017- Start Rituxan q 54M [place] OCT 12th 2017- PET NED.   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   Patient denies any new lumps or bumps. Appetite is good. No nausea no vomiting. No recent infections. Complains of right knee pain/chronic arthritis. Denies any shoulder pain. No headaches.  ROS: A complete 10 point review of system is done which is negative except mentioned above in history of present illness  MEDICAL HISTORY:  Past Medical History:  Diagnosis Date  . Arthritis   . GERD (gastroesophageal reflux disease)   . Headache(784.0)   . History of methotrexate therapy   . Hyperlipidemia    hx  . Lymphadenopathy of head and neck 01/2015   see on Thyroid ultrasound  . Lymphoma, mantle cell ([place]) 06/01/2015   bx of lymph node in right breast  . Rheumatoid arthritis ([place])     SURGICAL HISTORY: Past Surgical History:  Procedure Laterality Date  . CARDIAC CATHETERIZATION  09/2004   [facility]; EF 60%  . CARDIAC CATHETERIZATION  08/2004   [facility]  . PERIPHERAL VASCULAR  CATHETERIZATION N/A 07/04/2015   Procedure: [name] Insertion;  Surgeon: [name], MD;  Location: [place] [facility];  Service: Cardiovascular;  Laterality: N/A;    SOCIAL HISTORY: Social History   Social History  . Marital status: Married    Spouse name: N/A  . Number of children: N/A  . Years of education: N/A   Occupational History  . Not on file.   Social History Main Topics  . Smoking status: Never Smoker  . Smokeless tobacco: Never Used  . Alcohol use No  . Drug use: No  . Sexual activity: No   Other Topics Concern  . 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    FAMILY HISTORY: Family History  Problem Relation Age of Onset  . Diabetes Mother   . Cholelithiasis Mother   . Hypertension Sister   . Diabetes Sister   . Arthritis Brother     ALLERGIES:  has No Known Allergies.  MEDICATIONS:  Current Outpatient Prescriptions  Medication Sig Dispense Refill  . acetaminophen (TYLENOL) 500 MG tablet Take 500 mg by mouth every 6 (six) hours as needed.    . Calcium Carbonate-Vit D-Min (CALTRATE 600+D PLUS) 600-800 MG-UNIT CHEW Chew 1 tablet by mouth 2 (two) times daily. 60 tablet 6  . cyanocobalamin 500 MCG tablet Take 500 mcg by mouth daily. Take two gummies daily    .  fluticasone (FLONASE) 50 MCG/ACT nasal spray SPRAY TWICE IN EACH NOSTRIL ONCE DAILY  4  . hydroxychloroquine (PLAQUENIL) 200 MG tablet TAKE 2 TABLETS (400 MG TOTAL) BY MOUTH DAILY. 180 tablet 0  . Multiple Vitamins-Minerals (MULTIVITAMIN GUMMIES ADULT) CHEW Chew by mouth. Reported on 09/06/2015    . omeprazole (PRILOSEC) 40 MG capsule Take 40 mg by mouth daily.     [name] lidocaine-prilocaine (EMLA) cream Apply 1 application topically as needed. (Patient not taking: Reported on 12/19/2016) 30 g 11  . meclizine (ANTIVERT) 25 MG tablet Take 25 mg by mouth 3 (three) times daily as needed for dizziness.    . ondansetron  (ZOFRAN) 8 MG tablet Take 1 tablet (8 mg total) by mouth every 8 (eight) hours as needed for nausea or vomiting (start 3 days; after chemo). (Patient not taking: Reported on 12/19/2016) 40 tablet 0  . oxyCODONE (OXY IR/ROXICODONE) 5 MG immediate release tablet Take 5 mg by mouth every 4 (four) hours as needed for severe pain.    [name] PREVIDENT 0.2 % SOLN RINSE AFTER THOROUGH BRUSHING  10  . prochlorperazine (COMPAZINE) 10 MG tablet Take 1 tablet (10 mg total) by mouth every 6 (six) hours as needed for nausea or vomiting. (Patient not taking: Reported on 12/19/2016) 30 tablet 0   No current facility-administered medications for this visit.    Facility-Administered Medications Ordered in Other Visits  Medication Dose Route Frequency Provider Last Rate Last Dose  . [COMPLETED] heparin lock flush 100 unit/mL  500 Units Intravenous Once Cammie Sickle, MD   500 Units at 12/19/16 1350  . Tbo-Filgrastim (GRANIX) injection 480 mcg  480 mcg Subcutaneous Once Charlaine Dalton R, MD          .  PHYSICAL EXAMINATION: ECOG PERFORMANCE STATUS: 0 - Asymptomatic  Vitals:   12/19/16 1100  BP: (!) 154/84  Pulse: 73  Resp: 18  Temp: 97.6 F (36.4 C)   There were no vitals filed for this visit.  GENERAL: Well-nourished well-developed; Alert, no distress and comfortable. Accompanied by her Sister.  EYES: no pallor or icterus OROPHARYNX: no thrush or ulceration;  NECK: supple, no masses felt LYMPH:  lymphadenopathy in the cervical, axillary/inguinal lymph nodes resolved. LUNGS: clear to auscultation and No wheeze or crackles HEART/CVS: regular rate & rhythm and no murmurs; No lower extremity edema ABDOMEN: abdomen soft, non-tender and normal bowel sounds; no hepato-splenomegaly.  Musculoskeletal:no cyanosis of digits and no clubbing;  PSYCH: alert & oriented x 3 with fluent speech NEURO: no focal motor/sensory deficits SKIN: no rashes or significant lesions  LABORATORY DATA:  I have  reviewed the data as listed Lab Results  Component Value Date   WBC 5.9 12/19/2016   HGB 13.4 12/19/2016   HCT 39.8 12/19/2016   MCV 88.6 12/19/2016   PLT 168 12/19/2016    Recent Labs  08/15/16 0805 09/12/16 0802 10/10/16 0821 12/19/16 1014  NA 139 140 138 139  K 4.0 3.7 3.7 4.0  CL 106 108 108 106  CO2 29 27 27 26   GLUCOSE 96 90 94 93  BUN 18 15 14 15   CREATININE 0.79 0.67 0.78 0.76  CALCIUM 8.8* 8.8* 8.9 9.0  GFRNONAA >60 >60 >60 >60  GFRAA >60 >60 >60 >60  PROT 6.7  --  7.0 7.0  ALBUMIN 3.7  --  3.9 3.9  AST 23  --  24 23  ALT 12*  --  14 12*  ALKPHOS 79  --  77 80  BILITOT 0.6  --  0.8 0.7   IMPRESSION: 1. No findings of recurrent lymphoma. 2.  Aortic Atherosclerosis (ICD10-I70.0). 3. 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   Electronically Signed   By: Van Clines M.D.   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 continue with maintenance Rituximab every 8 weeks; Tolerating Rituxan very well. Labs today reviewed;  acceptable for treatment today. Discussed that maintenance could potentially be done for at least 2 years or more.  # left knee pain- likely arthritis. Declines referral to orthopedics.   # follow up in 8 weeks/rituximab infusion/labs/port flush.     Cc:Dr.Cook.     Cammie Sickle, MD 12/19/2016 1:36 PM

## 2017-01-29 ENCOUNTER — Encounter: Payer: Self-pay | Admitting: Family

## 2017-01-29 ENCOUNTER — Ambulatory Visit (INDEPENDENT_AMBULATORY_CARE_PROVIDER_SITE_OTHER): Payer: Medicare HMO | Admitting: Family

## 2017-01-29 VITALS — BP 134/80 | HR 71 | Temp 98.3°F | Ht 66.0 in | Wt 252.8 lb

## 2017-01-29 DIAGNOSIS — Z0000 Encounter for general adult medical examination without abnormal findings: Secondary | ICD-10-CM | POA: Diagnosis not present

## 2017-01-29 DIAGNOSIS — C8311 Mantle cell lymphoma, lymph nodes of head, face, and neck: Secondary | ICD-10-CM | POA: Diagnosis not present

## 2017-01-29 DIAGNOSIS — M069 Rheumatoid arthritis, unspecified: Secondary | ICD-10-CM | POA: Diagnosis not present

## 2017-01-29 DIAGNOSIS — Z23 Encounter for immunization: Secondary | ICD-10-CM

## 2017-01-29 LAB — LIPID PANEL
Cholesterol: 195 mg/dL (ref 0–200)
HDL: 69.4 mg/dL (ref >39.00)
LDL Cholesterol: 115 mg/dL — ABNORMAL HIGH (ref 0–99)
NonHDL: 125.54
Total CHOL/HDL Ratio: 3
Triglycerides: 55 mg/dL (ref 0.0–149.0)
VLDL: 11 mg/dL (ref 0.0–40.0)

## 2017-01-29 LAB — VITAMIN D 25 HYDROXY (VIT D DEFICIENCY, FRACTURES): VITD: 23.09 ng/mL — ABNORMAL LOW (ref 30.00–100.00)

## 2017-01-29 NOTE — Assessment & Plan Note (Signed)
Stable. Recent PET scan showed no new lesions. Will follow.

## 2017-01-29 NOTE — Assessment & Plan Note (Signed)
No current medication treatment/rheumatologist. Due to prednisone exposure and age, repeat dexa and ordered vitamin D.

## 2017-01-29 NOTE — Progress Notes (Signed)
Subjective:    Patient ID: Michelle [name], female    DOB: June 17, 1940, 76 y.o.   MRN: [id number]  CC: Michelle [name] is a 76 y.o. female who presents today for physical exam.    HPI: RA- no longer on any medications due to chemo.  Symptoms are not flaring at this time. Had seen Dr Jefm Bryant in the past.   lymphoma- dx 06/2015. Treated with chemo alone. 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  Breast Cancer Screening: Mammogram due Cervical Cancer Screening: No longer doing pap smears. No pelvic pain, vaginal bleeding.  Bone Health screening/DEXA for 65+: Normal 2010. Defer screening at this time. Lung Cancer Screening: Doesn't have 30 year pack year history and age > 29 years.       Tetanus - utd        Pneumococcal - Complete Labs: Screening labs today. Exercise: Gets regular exercise.  Alcohol use: none Smoking/tobacco use: Nonsmoker.  Regular dental exams: UTD Wears seat belt: Yes.  HISTORY:  Past Medical History:  Diagnosis Date  . Arthritis   . GERD (gastroesophageal reflux disease)   . Headache(784.0)   . History of methotrexate therapy   . Hyperlipidemia    hx  . Lymphadenopathy of head and neck 01/2015   see on Thyroid ultrasound  . Lymphoma, mantle cell ([place]) 06/01/2015   bx of lymph node in right breast  . Rheumatoid arthritis [facility])     Past Surgical History:  Procedure Laterality Date  . CARDIAC CATHETERIZATION  09/2004   [facility]; EF 60%  . CARDIAC CATHETERIZATION  08/2004   [facility]  . PERIPHERAL VASCULAR CATHETERIZATION N/A 07/04/2015   Procedure: [name] Insertion;  Surgeon: [name], MD;  Location: [facility];  Service: Cardiovascular;  Laterality: N/A;   Family History  Problem Relation Age of Onset  . Diabetes Mother   . Cholelithiasis Mother   . Hypertension Sister   . Diabetes Sister   .  Arthritis Brother       ALLERGIES: Patient has no known allergies.  Current Outpatient Prescriptions on File Prior to Visit  Medication Sig Dispense Refill  . acetaminophen (TYLENOL) 500 MG tablet Take 500 mg by mouth every 6 (six) hours as needed.    . Calcium Carbonate-Vit D-Min (CALTRATE 600+D PLUS) 600-800 MG-UNIT CHEW Chew 1 tablet by mouth 2 (two) times daily. 60 tablet 6  . cyanocobalamin 500 MCG tablet Take 500 mcg by mouth daily. Take two gummies daily    . fluticasone (FLONASE) 50 MCG/ACT nasal spray SPRAY TWICE IN EACH NOSTRIL ONCE DAILY  4  . lidocaine-prilocaine (EMLA) cream Apply 1 application topically as needed. 30 g 11  . meclizine (ANTIVERT) 25 MG tablet Take 25 mg by mouth 3 (three) times daily as needed for dizziness.    . Multiple Vitamins-Minerals (MULTIVITAMIN GUMMIES ADULT) CHEW Chew by mouth. Reported on 09/06/2015    . omeprazole (PRILOSEC) 40 MG capsule Take 40 mg by mouth daily.     . ondansetron (ZOFRAN) 8 MG tablet Take 1 tablet (8 mg total) by mouth every 8 (eight) hours as needed for nausea or vomiting (start 3 days; after chemo). 40 tablet 0  . oxyCODONE (OXY IR/ROXICODONE) 5 MG immediate release tablet Take 5 mg by mouth every 4 (four) hours as needed for severe pain.    [name] PREVIDENT 0.2 %  SOLN RINSE AFTER THOROUGH BRUSHING  10  . prochlorperazine (COMPAZINE) 10 MG tablet Take 1 tablet (10 mg total) by mouth every 6 (six) hours as needed for nausea or vomiting. 30 tablet 0   Current Facility-Administered Medications on File Prior to Visit  Medication Dose Route Frequency Provider Last Rate Last Dose  . Tbo-Filgrastim (GRANIX) injection 480 mcg  480 mcg Subcutaneous Once Cammie Sickle, MD        Social History  Substance Use Topics  . Smoking status: Never Smoker  . Smokeless tobacco: Never Used  . Alcohol use No    Review of Systems  Constitutional: Negative for chills, fever and unexpected weight change.  HENT: Negative for congestion.     Respiratory: Negative for cough.   Cardiovascular: Negative for chest pain, palpitations and leg swelling.  Gastrointestinal: Negative for nausea and vomiting.  Musculoskeletal: Negative for arthralgias and myalgias.  Skin: Negative for rash.  Neurological: Negative for headaches.  Hematological: Negative for adenopathy.  Psychiatric/Behavioral: Negative for confusion.      Objective:    BP 134/80   Pulse 71   Temp 98.3 F (36.8 C) (Oral)   Ht 5\' 6"  (1.676 m)   Wt 252 lb 12.8 oz (114.7 kg)   SpO2 96%   BMI 40.80 kg/m   BP Readings from Last 3 Encounters:  01/29/17 134/80  12/19/16 (!) 154/84  11/07/16 118/70   Wt Readings from Last 3 Encounters:  01/29/17 252 lb 12.8 oz (114.7 kg)  11/07/16 254 lb 6.4 oz (115.4 kg)  10/10/16 248 lb 6 oz (112.7 kg)    Physical Exam  Constitutional: She appears well-developed and well-nourished.  Eyes: Conjunctivae are normal.  Neck: No thyroid mass and no thyromegaly present.  Cardiovascular: Normal rate, regular rhythm, normal heart sounds and normal pulses.   Pulmonary/Chest: Effort normal and breath sounds normal. She has no wheezes. She has no rhonchi. She has no rales. Right breast exhibits no inverted nipple, no mass, no nipple discharge, no skin change and no tenderness. Left breast exhibits no inverted nipple, no mass, no nipple discharge, no skin change and no tenderness. Breasts are symmetrical.  CBE performed.   Lymphadenopathy:       Head (right side): No submental, no submandibular, no tonsillar, no preauricular, no posterior auricular and no occipital adenopathy present.       Head (left side): No submental, no submandibular, no tonsillar, no preauricular, no posterior auricular and no occipital adenopathy present.    She has no cervical adenopathy.       Right cervical: No superficial cervical, no deep cervical and no posterior cervical adenopathy present.      Left cervical: No superficial cervical, no deep cervical and  no posterior cervical adenopathy present.    She has no axillary adenopathy.  Neurological: She is alert.  Skin: Skin is warm and dry.  Psychiatric: She has a normal mood and affect. Her speech is normal and behavior is normal. Thought content normal.  Vitals reviewed.      Assessment & Plan:   Problem List Items Addressed This Visit      Musculoskeletal and Integument   Rheumatoid arthritis ([place])    No current medication treatment/rheumatologist. Due to prednisone exposure and age, repeat dexa and ordered vitamin D.       Relevant Orders   DG Bone Density   VITAMIN D 25 Hydroxy (Vit-D Deficiency, Fractures)     Other   Routine physical examination    Declines  pelvic exam based on age and preference. CBE performed. Ordered mammogram and patient understands schedule. Up-to-date on colonoscopy. Order DEXA scan. Lipid panel ordered to screen for cholesterol. Reviewed CBC, CMP done through oncology recently, normal kidney function. No anemia.      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 Recent PET scan showed no new lesions. Will follow.        Other Visit Diagnoses    Encounter for immunization       Relevant Orders   Flu vaccine HIGH DOSE PF (Completed)       I have discontinued Ms. Daw's hydroxychloroquine. I am also having her maintain her CALTRATE 600+D PLUS MINERALS, MULTIVITAMIN GUMMIES ADULT, lidocaine-prilocaine, prochlorperazine, ondansetron, fluticasone, omeprazole, cyanocobalamin, acetaminophen, PREVIDENT, meclizine, and oxyCODONE.   No orders of the defined types were placed in this encounter.   Return precautions given.   Risks, benefits, and alternatives of the medications and treatment plan prescribed today were discussed, and patient expressed understanding.   Education regarding symptom management and diagnosis given to  patient on AVS.   Continue to follow with Burnard Hawthorne, FNP for routine health maintenance.   [name] and I agreed with plan.   Mable Paris, FNP

## 2017-01-29 NOTE — Patient Instructions (Signed)
Labs today  Michelle [name] how well [name] are doing!  We placed a referral for mammogram this year. I asked that [name] call one the below locations and schedule this when it is convenient for [name].   As discussed, I would like [name] to ask for 3D mammogram over the traditional 2D mammogram as new evidence suggest 3D is superior.   Please note that NOT all insurance companies cover 3D and [name] may have to pay a higher copay. [name] may call your insurance company to further clarify your benefits.   Options for [place]  [place], [place]  * [name] [place] [place] * Note if [name] ask for 3D mammogram at this location, [name] must request [place], [place] location*    Health Maintenance for Postmenopausal Women Menopause is a normal process in which your reproductive ability comes to an end. This process happens gradually over a span of months to years, usually between the ages of 58 and 64. Menopause is complete when [name] have missed 12 consecutive menstrual periods. It is important to talk with your health care provider about some of the most common conditions that affect postmenopausal women, such as heart disease, cancer, and bone loss (osteoporosis). Adopting a healthy lifestyle and getting preventive care can help to promote your health and wellness. Those actions can also lower your chances of developing some of these common conditions. What should I know about menopause? During menopause, [name] may experience a number of symptoms, such as:  Moderate-to-severe hot flashes.  Night sweats.  Decrease in sex drive.  Mood swings.  Headaches.  Tiredness.  Irritability.  Memory problems.  Insomnia.  Choosing to treat or not to treat menopausal changes is an individual decision that [name] make with your health care provider. What should  I know about hormone replacement therapy and supplements? Hormone therapy products are effective for treating symptoms that are associated with menopause, such as hot flashes and night sweats. Hormone replacement carries certain risks, especially as [name] become older. If [name] are thinking about using estrogen or estrogen with progestin treatments, discuss the benefits and risks with your health care provider. What should I know about heart disease and stroke? Heart disease, heart attack, and stroke become more likely as [name] age. This may be due, in part, to the hormonal changes that your body experiences during menopause. These can affect how your body processes dietary fats, triglycerides, and cholesterol. Heart attack and stroke are both medical emergencies. There are many things that [name] can do to help prevent heart disease and stroke:  Have your blood pressure checked at least every 1-2 years. High blood pressure causes heart disease and increases the risk of stroke.  If [name] are 7-6 years old, ask your health care provider if [name] should take aspirin to prevent a heart attack or a stroke.  Do not use any tobacco products, including cigarettes, chewing tobacco, or electronic cigarettes. If [name] need help quitting, ask your health care provider.  It is important to eat a healthy diet and maintain a healthy weight. ? Be sure to include plenty of vegetables, fruits, low-fat dairy products, and lean protein. ? Avoid eating foods that are high in solid fats, added sugars, or salt (sodium).  Get regular exercise. This is one of the most important things that [name] can do for your  health. ? Try to exercise for at least 150 minutes each week. The type of exercise that you do should increase your heart rate and make [name] sweat. This is known as moderate-intensity exercise. ? Try to do strengthening exercises at least twice each week. Do these in addition to the moderate-intensity exercise.  Know your  numbers.Ask your health care provider to check your cholesterol and your blood glucose. Continue to have your blood tested as directed by your health care provider.  What should I know about cancer screening? There are several types of cancer. Take the following steps to reduce your risk and to catch any cancer development as early as possible. Breast Cancer  Practice breast self-awareness. ? This means understanding how your breasts normally appear and feel. ? It also means doing regular breast self-exams. Let your health care provider know about any changes, no matter how small.  If [name] are 35 or older, have a clinician do a breast exam (clinical breast exam or CBE) every year. Depending on your age, family history, and medical history, it may be recommended that [name] also have a yearly breast X-ray (mammogram).  If [name] have a family history of breast cancer, talk with your health care provider about genetic screening.  If [name] are at high risk for breast cancer, talk with your health care provider about having an MRI and a mammogram every year.  Breast cancer (BRCA) gene test is recommended for women who have family members with BRCA-related cancers. Results of the assessment will determine the need for genetic counseling and BRCA1 and for BRCA2 testing. BRCA-related cancers include these types: ? Breast. This occurs in males or females. ? Ovarian. ? Tubal. This may also be called fallopian tube cancer. ? Cancer of the abdominal or pelvic lining (peritoneal cancer). ? Prostate. ? Pancreatic.  Cervical, Uterine, and Ovarian Cancer Your health care provider may recommend that [name] be screened regularly for cancer of the pelvic organs. These include your ovaries, uterus, and vagina. This screening involves a pelvic exam, which includes checking for microscopic changes to the surface of your cervix (Pap test).  For women ages 21-65, health care providers may recommend a pelvic exam and a Pap  test every three years. For women ages 50-65, they may recommend the Pap test and pelvic exam, combined with testing for human papilloma virus (HPV), every five years. Some types of HPV increase your risk of cervical cancer. Testing for HPV may also be done on women of any age who have unclear Pap test results.  Other health care providers may not recommend any screening for nonpregnant women who are considered low risk for pelvic cancer and have no symptoms. Ask your health care provider if a screening pelvic exam is right for [name].  If [name] have had past treatment for cervical cancer or a condition that could lead to cancer, [name] need Pap tests and screening for cancer for at least 20 years after your treatment. If Pap tests have been discontinued for [name], your risk factors (such as having a new sexual partner) need to be reassessed to determine if [name] should start having screenings again. Some women have medical problems that increase the chance of getting cervical cancer. In these cases, your health care provider may recommend that [name] have screening and Pap tests more often.  If [name] have a family history of uterine cancer or ovarian cancer, talk with your health care provider about genetic screening.  If [name] have vaginal bleeding after  reaching menopause, tell your health care provider.  There are currently no reliable tests available to screen for ovarian cancer.  Lung Cancer Lung cancer screening is recommended for adults 14-56 years old who are at high risk for lung cancer because of a history of smoking. A yearly low-dose CT scan of the lungs is recommended if [name]:  Currently smoke.  Have a history of at least 30 pack-years of smoking and [name] currently smoke or have quit within the past 15 years. A pack-year is smoking an average of one pack of cigarettes per day for one year.  Yearly screening should:  Continue until it has been 15 years since [name] quit.  Stop if [name] develop a health  problem that would prevent [name] from having lung cancer treatment.  Colorectal Cancer  This type of cancer can be detected and can often be prevented.  Routine colorectal cancer screening usually begins at age 17 and continues through age 36.  If [name] have risk factors for colon cancer, your health care provider may recommend that [name] be screened at an earlier age.  If [name] have a family history of colorectal cancer, talk with your health care provider about genetic screening.  Your health care provider may also recommend using home test kits to check for hidden blood in your stool.  A small camera at the end of a tube can be used to examine your colon directly (sigmoidoscopy or colonoscopy). This is done to check for the earliest forms of colorectal cancer.  Direct examination of the colon should be repeated every 5-10 years until age 72. However, if early forms of precancerous polyps or small growths are found or if [name] have a family history or genetic risk for colorectal cancer, [name] may need to be screened more often.  Skin Cancer  Check your skin from head to toe regularly.  Monitor any moles. Be sure to tell your health care provider: ? About any new moles or changes in moles, especially if there is a change in a mole's shape or color. ? If [name] have a mole that is larger than the size of a pencil eraser.  If any of your family members has a history of skin cancer, especially at a young age, talk with your health care provider about genetic screening.  Always use sunscreen. Apply sunscreen liberally and repeatedly throughout the day.  Whenever [name] are outside, protect yourself by wearing long sleeves, pants, a wide-brimmed hat, and sunglasses.  What should I know about osteoporosis? Osteoporosis is a condition in which bone destruction happens more quickly than new bone creation. After menopause, [name] may be at an increased risk for osteoporosis. To help prevent osteoporosis or the  bone fractures that can happen because of osteoporosis, the following is recommended:  If [name] are 61-50 years old, get at least 1,000 mg of calcium and at least 600 mg of vitamin D per day.  If [name] are older than age 1 but younger than age 87, get at least 1,200 mg of calcium and at least 600 mg of vitamin D per day.  If [name] are older than age 65, get at least 1,200 mg of calcium and at least 800 mg of vitamin D per day.  Smoking and excessive alcohol intake increase the risk of osteoporosis. Eat foods that are rich in calcium and vitamin D, and do weight-bearing exercises several times each week as directed by your health care provider. What should I know about how menopause affects  my mental health? Depression may occur at any age, but it is more common as [name] become older. Common symptoms of depression include:  Low or sad mood.  Changes in sleep patterns.  Changes in appetite or eating patterns.  Feeling an overall lack of motivation or enjoyment of activities that [name] previously enjoyed.  Frequent crying spells.  Talk with your health care provider if [name] think that [name] are experiencing depression. What should I know about immunizations? It is important that [name] get and maintain your immunizations. These include:  Tetanus, diphtheria, and pertussis (Tdap) booster vaccine.  Influenza every year before the flu season begins.  Pneumonia vaccine.  Shingles vaccine.  Your health care provider may also recommend other immunizations. This information is not intended to replace advice given to [name] by your health care provider. Make sure [name] discuss any questions [name] have with your health care provider. Document Released: 07/11/2005 Document Revised: 12/07/2015 Document Reviewed: 02/20/2015 Elsevier Interactive Patient Education  2018 [employer].

## 2017-01-29 NOTE — Progress Notes (Signed)
Pre visit review using our clinic review tool, if applicable. No additional management support is needed unless otherwise documented below in the visit note. 

## 2017-01-29 NOTE — Assessment & Plan Note (Addendum)
Declines pelvic exam based on age and preference. CBE performed. Ordered mammogram and patient understands schedule. Up-to-date on colonoscopy. Order DEXA scan. Lipid panel ordered to screen for cholesterol. Reviewed CBC, CMP done through oncology recently, normal kidney function. No anemia.

## 2017-02-03 ENCOUNTER — Other Ambulatory Visit: Payer: Self-pay | Admitting: Family

## 2017-02-03 DIAGNOSIS — E785 Hyperlipidemia, unspecified: Secondary | ICD-10-CM

## 2017-02-03 MED ORDER — PRAVASTATIN SODIUM 40 MG PO TABS: 40.0000 mg | ORAL_TABLET | Freq: Every day | ORAL | 2 refills | Status: DC

## 2017-02-05 ENCOUNTER — Telehealth: Payer: Self-pay

## 2017-02-05 DIAGNOSIS — C8311 Mantle cell lymphoma, lymph nodes of head, face, and neck: Secondary | ICD-10-CM

## 2017-02-05 NOTE — Telephone Encounter (Signed)
Patient was DX with Breast cancer a few years ago. The Mammo order needs to be changed to be a  Bilat diagnostic mammo and [place].

## 2017-02-06 NOTE — Telephone Encounter (Signed)
Ordered. Please let patient know.

## 2017-02-06 NOTE — Addendum Note (Signed)
Addended by: Burnard Hawthorne on: 02/06/2017 06:17 PM   Modules accepted: Orders

## 2017-02-09 ENCOUNTER — Encounter: Payer: Self-pay | Admitting: Family

## 2017-02-20 ENCOUNTER — Inpatient Hospital Stay: Payer: Medicare HMO

## 2017-02-20 ENCOUNTER — Inpatient Hospital Stay: Payer: Medicare HMO | Attending: Internal Medicine

## 2017-02-20 ENCOUNTER — Inpatient Hospital Stay (HOSPITAL_BASED_OUTPATIENT_CLINIC_OR_DEPARTMENT_OTHER): Payer: Medicare HMO | Admitting: Internal Medicine

## 2017-02-20 VITALS — BP 135/76 | HR 60 | Temp 97.8°F | Resp 20 | Ht 66.0 in | Wt 250.0 lb

## 2017-02-20 DIAGNOSIS — I700 Atherosclerosis of aorta: Secondary | ICD-10-CM | POA: Insufficient documentation

## 2017-02-20 DIAGNOSIS — K219 Gastro-esophageal reflux disease without esophagitis: Secondary | ICD-10-CM

## 2017-02-20 DIAGNOSIS — D709 Neutropenia, unspecified: Secondary | ICD-10-CM | POA: Insufficient documentation

## 2017-02-20 DIAGNOSIS — M6250 Muscle wasting and atrophy, not elsewhere classified, unspecified site: Secondary | ICD-10-CM | POA: Diagnosis not present

## 2017-02-20 DIAGNOSIS — Z79899 Other long term (current) drug therapy: Secondary | ICD-10-CM

## 2017-02-20 DIAGNOSIS — Z9221 Personal history of antineoplastic chemotherapy: Secondary | ICD-10-CM | POA: Diagnosis not present

## 2017-02-20 DIAGNOSIS — C8311 Mantle cell lymphoma, lymph nodes of head, face, and neck: Secondary | ICD-10-CM

## 2017-02-20 DIAGNOSIS — Z8572 Personal history of non-Hodgkin lymphomas: Secondary | ICD-10-CM

## 2017-02-20 DIAGNOSIS — M069 Rheumatoid arthritis, unspecified: Secondary | ICD-10-CM

## 2017-02-20 DIAGNOSIS — E785 Hyperlipidemia, unspecified: Secondary | ICD-10-CM

## 2017-02-20 LAB — CBC WITH DIFFERENTIAL/PLATELET
Basophils Absolute: 0 10*3/uL (ref 0–0.1)
Basophils Relative: 1 %
Eosinophils Absolute: 0.1 10*3/uL (ref 0–0.7)
Eosinophils Relative: 2 %
HCT: 39.1 % (ref 35.0–47.0)
Hemoglobin: 13.4 g/dL (ref 12.0–16.0)
Lymphocytes Relative: 21 %
Lymphs Abs: 1.3 10*3/uL (ref 1.0–3.6)
MCH: 30.3 pg (ref 26.0–34.0)
MCHC: 34.2 g/dL (ref 32.0–36.0)
MCV: 88.5 fL (ref 80.0–100.0)
Monocytes Absolute: 0.4 10*3/uL (ref 0.2–0.9)
Monocytes Relative: 6 %
Neutro Abs: 4.2 10*3/uL (ref 1.4–6.5)
Neutrophils Relative %: 70 %
Platelets: 180 10*3/uL (ref 150–440)
RBC: 4.41 MIL/uL (ref 3.80–5.20)
RDW: 14.9 % — ABNORMAL HIGH (ref 11.5–14.5)
WBC: 6 10*3/uL (ref 3.6–11.0)

## 2017-02-20 LAB — COMPREHENSIVE METABOLIC PANEL
ALT: 13 U/L — ABNORMAL LOW (ref 14–54)
AST: 20 U/L (ref 15–41)
Albumin: 3.8 g/dL (ref 3.5–5.0)
Alkaline Phosphatase: 68 U/L (ref 38–126)
Anion gap: 7 (ref 5–15)
BUN: 14 mg/dL (ref 6–20)
CO2: 25 mmol/L (ref 22–32)
Calcium: 8.9 mg/dL (ref 8.9–10.3)
Chloride: 106 mmol/L (ref 101–111)
Creatinine, Ser: 0.74 mg/dL (ref 0.44–1.00)
GFR calc Af Amer: >60 mL/min (ref >60)
GFR calc non Af Amer: >60 mL/min (ref >60)
Glucose, Bld: 103 mg/dL — ABNORMAL HIGH (ref 65–99)
Potassium: 3.8 mmol/L (ref 3.5–5.1)
Sodium: 138 mmol/L (ref 135–145)
Total Bilirubin: 0.7 mg/dL (ref 0.3–1.2)
Total Protein: 6.8 g/dL (ref 6.5–8.1)

## 2017-02-20 LAB — LACTATE DEHYDROGENASE: LDH: 188 U/L (ref 98–192)

## 2017-02-20 MED ORDER — ACETAMINOPHEN 325 MG PO TABS
650.0000 mg | ORAL_TABLET | Freq: Once | ORAL | Status: AC
  Administered 2017-02-20: 650 mg via ORAL
  Filled 2017-02-20: qty 2

## 2017-02-20 MED ORDER — HEPARIN SOD (PORK) LOCK FLUSH 100 UNIT/ML IV SOLN
500.0000 [IU] | Freq: Once | INTRAVENOUS | Status: AC | PRN
  Administered 2017-02-20: 500 [IU]
  Filled 2017-02-20: qty 5

## 2017-02-20 MED ORDER — SODIUM CHLORIDE 0.9 % IV SOLN: 375.0000 mg/m2 | Freq: Once | INTRAVENOUS | Status: DC

## 2017-02-20 MED ORDER — DIPHENHYDRAMINE HCL 25 MG PO CAPS
50.0000 mg | ORAL_CAPSULE | Freq: Once | ORAL | Status: AC
  Administered 2017-02-20: 50 mg via ORAL
  Filled 2017-02-20: qty 2

## 2017-02-20 MED ORDER — SODIUM CHLORIDE 0.9% FLUSH
10.0000 mL | INTRAVENOUS | Status: DC | PRN
  Filled 2017-02-20: qty 10

## 2017-02-20 MED ORDER — SODIUM CHLORIDE 0.9 % IV SOLN
Freq: Once | INTRAVENOUS | Status: AC
  Administered 2017-02-20: 11:00:00 via INTRAVENOUS
  Filled 2017-02-20: qty 1000

## 2017-02-20 MED ORDER — SODIUM CHLORIDE 0.9 % IV SOLN
800.0000 mg | Freq: Once | INTRAVENOUS | Status: AC
  Administered 2017-02-20: 800 mg via INTRAVENOUS
  Filled 2017-02-20: qty 50

## 2017-02-20 NOTE — Progress Notes (Signed)
[place] NOTE  Patient Care Team: Burnard Hawthorne, FNP as PCP - General (Family Medicine) Jackolyn Confer, MD (Internal Medicine)  CHIEF COMPLAINTS/PURPOSE OF CONSULTATION: MANTLE CELL LYMPHOMA  Oncology History   # JAN 2017- MANTLE CELL LYMPHOMA STAGE IV; [R Breast LN [place] Core Bx-1.2cm LN/R Ax LN-Bx]; cyclin D Pos; Mitotic rate-LOW; MIPI score [5/intermediate risk]; BMBx-Positive for involvement. Feb 9th- START Benda-Ritux with neulasta; Prolonged neutropenia; DISCONT- Benda-Ritux;   # April 13 th 2017- START R-CHOP x1; severe/prolonged neutropenia; PET- CR; BMBx-Neg; Disc R-CHOP  # 26th MAY 2017- Start Rituxan q 72M Main OCT 12th 2017- PET NED.   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   Patient denies any new lumps or bumps. Appetite is good. No nausea no vomiting. No recent infections. She continues to be very active for age. Denies any shoulder pain. No headaches.  ROS: A complete 10 point review of system is done which is negative except mentioned above in history of present illness  MEDICAL HISTORY:  Past Medical History:  Diagnosis Date  . Arthritis   . GERD (gastroesophageal reflux disease)   . Headache(784.0)   . History of methotrexate therapy   . Hyperlipidemia    hx  . Lymphadenopathy of head and neck 01/2015   see on Thyroid ultrasound  . Lymphoma, mantle cell ([place]) 06/01/2015   bx of lymph node in right breast  . Rheumatoid arthritis ([place])     SURGICAL HISTORY: Past Surgical History:  Procedure Laterality Date  . CARDIAC CATHETERIZATION  09/2004   [facility]; EF 60%  . CARDIAC CATHETERIZATION  08/2004   [facility]  . PERIPHERAL VASCULAR  CATHETERIZATION N/A 07/04/2015   Procedure: [name] Insertion;  Surgeon: [name], MD;  Location: [facility];  Service: Cardiovascular;  Laterality: N/A;    SOCIAL HISTORY: Social History   Social History  . Marital status: Married    Spouse name: N/A  . Number of children: N/A  . Years of education: N/A   Occupational History  . Not on file.   Social History Main Topics  . Smoking status: Never Smoker  . Smokeless tobacco: Never Used  . Alcohol use No  . Drug use: No  . Sexual activity: No   Other Topics Concern  . 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    FAMILY HISTORY: Family History  Problem Relation Age of Onset  . Diabetes Mother   . Cholelithiasis Mother   . Hypertension Sister   . Diabetes Sister   . Arthritis Brother     ALLERGIES:  has No Known Allergies.  MEDICATIONS:  Current Outpatient Prescriptions  Medication Sig Dispense Refill  . Calcium Carbonate-Vit D-Min (CALTRATE 600+D PLUS) 600-800 MG-UNIT CHEW Chew 1 tablet by mouth 2 (two) times daily. 60 tablet 6  . cyanocobalamin 500 MCG tablet Take 500 mcg by mouth daily. Take two gummies daily    . fluticasone (FLONASE) 50 MCG/ACT nasal spray SPRAY TWICE IN EACH NOSTRIL ONCE DAILY  4  . lidocaine-prilocaine (  EMLA) cream Apply 1 application topically as needed. 30 g 11  . meclizine (ANTIVERT) 25 MG tablet Take 25 mg by mouth 3 (three) times daily as needed for dizziness.    . Multiple Vitamins-Minerals (MULTIVITAMIN GUMMIES ADULT) CHEW Chew by mouth. Reported on 09/06/2015    . omeprazole (PRILOSEC) 40 MG capsule Take 40 mg by mouth daily.     . pravastatin (PRAVACHOL) 40 MG tablet Take 1 tablet (40 mg total) by mouth daily. 90 tablet 2  . PREVIDENT 0.2 % SOLN RINSE AFTER THOROUGH BRUSHING  10  . acetaminophen (TYLENOL) 500 MG tablet Take 500 mg by mouth every 6 (six) hours as needed.    .  hydroxychloroquine (PLAQUENIL) 200 MG tablet TAKE 2 TABLETS (400 MG TOTAL) BY MOUTH DAILY. 180 tablet 0  . ondansetron (ZOFRAN) 8 MG tablet Take 1 tablet (8 mg total) by mouth every 8 (eight) hours as needed for nausea or vomiting (start 3 days; after chemo). (Patient not taking: Reported on 02/20/2017) 40 tablet 0  . oxyCODONE (OXY IR/ROXICODONE) 5 MG immediate release tablet Take 5 mg by mouth every 4 (four) hours as needed for severe pain.    [name] prochlorperazine (COMPAZINE) 10 MG tablet Take 1 tablet (10 mg total) by mouth every 6 (six) hours as needed for nausea or vomiting. (Patient not taking: Reported on 02/20/2017) 30 tablet 0   No current facility-administered medications for this visit.    Facility-Administered Medications Ordered in Other Visits  Medication Dose Route Frequency Provider Last Rate Last Dose  . Tbo-Filgrastim (GRANIX) injection 480 mcg  480 mcg Subcutaneous Once Cammie Sickle, MD          .  PHYSICAL EXAMINATION: ECOG PERFORMANCE STATUS: 0 - Asymptomatic  Vitals:   02/20/17 0928  BP: 135/76  Pulse: 60  Resp: 20  Temp: 97.8 F (36.6 C)   Filed Weights   02/20/17 0928  Weight: 250 lb (113.4 kg)    GENERAL: Well-nourished well-developed; Alert, no distress and comfortable. Accompanied by her Sister.  EYES: no pallor or icterus OROPHARYNX: no thrush or ulceration;  NECK: supple, no masses felt LYMPH:  lymphadenopathy in the cervical, axillary/inguinal lymph nodes resolved. LUNGS: clear to auscultation and No wheeze or crackles HEART/CVS: regular rate & rhythm and no murmurs; No lower extremity edema ABDOMEN: abdomen soft, non-tender and normal bowel sounds; no hepato-splenomegaly.  Musculoskeletal:no cyanosis of digits and no clubbing;  PSYCH: alert & oriented x 3 with fluent speech NEURO: no focal motor/sensory deficits SKIN: no rashes or significant lesions  LABORATORY DATA:  I have reviewed the data as listed Lab Results  Component  Value Date   WBC 6.0 02/20/2017   HGB 13.4 02/20/2017   HCT 39.1 02/20/2017   MCV 88.5 02/20/2017   PLT 180 02/20/2017    Recent Labs  10/10/16 0821 12/19/16 1014 02/20/17 0907  NA 138 139 138  K 3.7 4.0 3.8  CL 108 106 106  CO2 27 26 25   GLUCOSE 94 93 103*  BUN 14 15 14   CREATININE 0.78 0.76 0.74  CALCIUM 8.9 9.0 8.9  GFRNONAA >60 >60 >60  GFRAA >60 >60 >60  PROT 7.0 7.0 6.8  ALBUMIN 3.9 3.9 3.8  AST 24 23 20   ALT 14 12* 13*  ALKPHOS 77 80 68  BILITOT 0.8 0.7 0.7   IMPRESSION: 1. No findings of recurrent lymphoma. 2.  Aortic Atherosclerosis (ICD10-I70.0). 3. 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 Secondary findings of rotator cuff  tears including rotator cuff muscular atrophy.   Electronically Signed   By: Van Clines M.D.   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 continue with maintenance Rituximab every 8 weeks; Tolerating Rituxan very well. Labs today reviewed;  acceptable for treatment today.   # Discussed that maintenance could potentially be done for at least 2 years or more. Needs port indefinitely.    # follow up in 8 weeks/rituximab infusion/labs PET scan prior.    Cc:Dr.Cook.     Cammie Sickle, MD 02/23/2017 9:48 PM

## 2017-02-20 NOTE — Assessment & Plan Note (Addendum)
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 continue with maintenance Rituximab every 8 weeks; Tolerating Rituxan very well. Labs today reviewed;  acceptable for treatment today.   # Discussed that maintenance could potentially be done for at least 2 years or more. Needs port indefinitely.    # follow up in 8 weeks/rituximab infusion/labs PET scan prior.    Cc:Dr.Cook.

## 2017-02-21 ENCOUNTER — Other Ambulatory Visit: Payer: Self-pay | Admitting: Family Medicine

## 2017-02-21 DIAGNOSIS — M069 Rheumatoid arthritis, unspecified: Secondary | ICD-10-CM

## 2017-03-12 ENCOUNTER — Ambulatory Visit
Admission: RE | Admit: 2017-03-12 | Discharge: 2017-03-12 | Disposition: A | Discharge status: Home / Self Care | Payer: Medicare HMO | Source: Ambulatory Visit | Attending: Family | Admitting: Family

## 2017-03-12 DIAGNOSIS — C8311 Mantle cell lymphoma, lymph nodes of head, face, and neck: Secondary | ICD-10-CM

## 2017-03-12 DIAGNOSIS — R928 Other abnormal and inconclusive findings on diagnostic imaging of breast: Secondary | ICD-10-CM | POA: Diagnosis not present

## 2017-03-22 IMAGING — CT NM PET TUM IMG RESTAG (PS) SKULL BASE T - THIGH
1 of 10 series · 1 of 25 positions shown · non-contrast
Comparison: 06/20/2015

CLINICAL DATA: Subsequent treatment strategy for mantle cell
lymphoma. Adenopathy in the head face and neck. Chemotherapy
ongoing..

EXAM:
NUCLEAR MEDICINE PET SKULL BASE TO THIGH
TECHNIQUE: 12.5 mCi F-18 FDG was injected intravenously. Full-ring PET imaging
was performed from the skull base to thigh after the radiotracer. CT
data was obtained and used for attenuation correction and anatomic
localization.
FASTING BLOOD GLUCOSE:  Value: 92 mg/dl

[Series 3: ct wb 5.0 b30f · axial · 5.0mm · 0.98mm/px · 1 of 290 slices shown]
[im 290/290  brain]
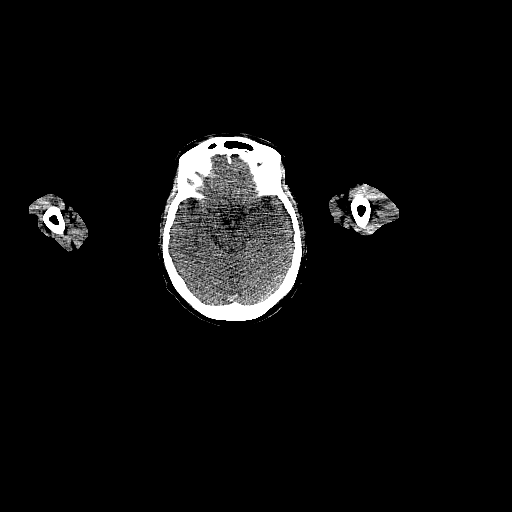

[1 of 25 positions shown; findings below may reference images not displayed]

FINDINGS: NECK

Resolution of the hypermetabolic LEFT and RIGHT level 2 lymph nodes
in the neck. No enlarged or hypermetabolic lymph nodes remain.

There is focal activity along the anterior RIGHT mandible which is
felt to be odontogenic. There is skin thickening adjacent to this
activity (image 35, series 3) suggesting infection.

CHEST

Resolution of previously described hypermetabolic axillary lymph
nodes. No residual enlarged or metabolically active axillary lymph
nodes. No hypermetabolic mediastinal or hilar nodes. No suspicious
pulmonary nodules on the CT scan.

ABDOMEN/PELVIS

No abnormal hypermetabolic activity within the liver, pancreas,
adrenal glands, or spleen. No hypermetabolic lymph nodes in the
abdomen or pelvis. Resolution of hypermetabolic inguinal lymph
nodes.

SKELETON

The uniform increase in bone marrow activity throughout skeleton.
IMPRESSION: 1. Resolution of metabolic adenopathy within the neck, axilla and
inguinal nodal stations. No remaining hypermetabolic lymph nodes.
2. Uniform increase in marrow activity consistent with GCSF type
response.
3. Focal activity associated with the RIGHT anterior mandible and
overlying skin is favored a odontogenic infection. Recommend
clinical correlation for

## 2017-04-15 ENCOUNTER — Ambulatory Visit: Payer: Medicare HMO

## 2017-04-16 ENCOUNTER — Telehealth: Payer: Self-pay | Admitting: *Deleted

## 2017-04-16 NOTE — Telephone Encounter (Signed)
-----   Message from [place] sent at 04/16/2017  8:25 AM EST ----- Regarding: CT scan Contact: [phone] Pt wants to know if she should cancel her appts tomorrow b/c they R/S her CT scan for next Wed 11-21.  Please advise

## 2017-04-16 NOTE — Telephone Encounter (Signed)
msg sent back to sch. Team to have pt keep her sch. apts for rituxan

## 2017-04-17 ENCOUNTER — Inpatient Hospital Stay: Payer: Medicare HMO | Attending: Internal Medicine

## 2017-04-17 ENCOUNTER — Inpatient Hospital Stay: Payer: Medicare HMO

## 2017-04-17 ENCOUNTER — Encounter: Payer: Self-pay | Admitting: Internal Medicine

## 2017-04-17 ENCOUNTER — Inpatient Hospital Stay (HOSPITAL_BASED_OUTPATIENT_CLINIC_OR_DEPARTMENT_OTHER): Payer: Medicare HMO | Admitting: Internal Medicine

## 2017-04-17 VITALS — BP 120/81 | HR 69 | Temp 97.4°F | Resp 16 | Wt 253.6 lb

## 2017-04-17 DIAGNOSIS — K219 Gastro-esophageal reflux disease without esophagitis: Secondary | ICD-10-CM | POA: Insufficient documentation

## 2017-04-17 DIAGNOSIS — M129 Arthropathy, unspecified: Secondary | ICD-10-CM

## 2017-04-17 DIAGNOSIS — E785 Hyperlipidemia, unspecified: Secondary | ICD-10-CM | POA: Diagnosis not present

## 2017-04-17 DIAGNOSIS — M069 Rheumatoid arthritis, unspecified: Secondary | ICD-10-CM | POA: Insufficient documentation

## 2017-04-17 DIAGNOSIS — I700 Atherosclerosis of aorta: Secondary | ICD-10-CM

## 2017-04-17 DIAGNOSIS — Z79899 Other long term (current) drug therapy: Secondary | ICD-10-CM | POA: Diagnosis not present

## 2017-04-17 DIAGNOSIS — C8311 Mantle cell lymphoma, lymph nodes of head, face, and neck: Secondary | ICD-10-CM

## 2017-04-17 DIAGNOSIS — Z5112 Encounter for antineoplastic immunotherapy: Secondary | ICD-10-CM | POA: Diagnosis not present

## 2017-04-17 LAB — COMPREHENSIVE METABOLIC PANEL
ALT: 12 U/L — ABNORMAL LOW (ref 14–54)
AST: 23 U/L (ref 15–41)
Albumin: 3.9 g/dL (ref 3.5–5.0)
Alkaline Phosphatase: 73 U/L (ref 38–126)
Anion gap: 9 (ref 5–15)
BUN: 12 mg/dL (ref 6–20)
CO2: 24 mmol/L (ref 22–32)
Calcium: 8.7 mg/dL — ABNORMAL LOW (ref 8.9–10.3)
Chloride: 105 mmol/L (ref 101–111)
Creatinine, Ser: 0.75 mg/dL (ref 0.44–1.00)
GFR calc Af Amer: >60 mL/min (ref >60)
GFR calc non Af Amer: >60 mL/min (ref >60)
Glucose, Bld: 112 mg/dL — ABNORMAL HIGH (ref 65–99)
Potassium: 3.7 mmol/L (ref 3.5–5.1)
Sodium: 138 mmol/L (ref 135–145)
Total Bilirubin: 0.7 mg/dL (ref 0.3–1.2)
Total Protein: 6.9 g/dL (ref 6.5–8.1)

## 2017-04-17 LAB — LACTATE DEHYDROGENASE: LDH: 198 U/L — ABNORMAL HIGH (ref 98–192)

## 2017-04-17 LAB — CBC WITH DIFFERENTIAL/PLATELET
Basophils Absolute: 0 10*3/uL (ref 0–0.1)
Basophils Relative: 0 %
Eosinophils Absolute: 0.1 10*3/uL (ref 0–0.7)
Eosinophils Relative: 2 %
HCT: 40.4 % (ref 35.0–47.0)
Hemoglobin: 13.1 g/dL (ref 12.0–16.0)
Lymphocytes Relative: 18 %
Lymphs Abs: 1.1 10*3/uL (ref 1.0–3.6)
MCH: 29.3 pg (ref 26.0–34.0)
MCHC: 32.4 g/dL (ref 32.0–36.0)
MCV: 90.4 fL (ref 80.0–100.0)
Monocytes Absolute: 0.4 10*3/uL (ref 0.2–0.9)
Monocytes Relative: 6 %
Neutro Abs: 4.7 10*3/uL (ref 1.4–6.5)
Neutrophils Relative %: 74 %
Platelets: 162 10*3/uL (ref 150–440)
RBC: 4.47 MIL/uL (ref 3.80–5.20)
RDW: 14.7 % — ABNORMAL HIGH (ref 11.5–14.5)
WBC: 6.4 10*3/uL (ref 3.6–11.0)

## 2017-04-17 MED ORDER — DIPHENHYDRAMINE HCL 25 MG PO CAPS
50.0000 mg | ORAL_CAPSULE | Freq: Once | ORAL | Status: AC
  Administered 2017-04-17: 50 mg via ORAL
  Filled 2017-04-17: qty 2

## 2017-04-17 MED ORDER — ACETAMINOPHEN 325 MG PO TABS
650.0000 mg | ORAL_TABLET | Freq: Once | ORAL | Status: AC
  Administered 2017-04-17: 650 mg via ORAL
  Filled 2017-04-17: qty 2

## 2017-04-17 MED ORDER — HEPARIN SOD (PORK) LOCK FLUSH 100 UNIT/ML IV SOLN
500.0000 [IU] | Freq: Once | INTRAVENOUS | Status: AC | PRN
  Administered 2017-04-17: 500 [IU]
  Filled 2017-04-17: qty 5

## 2017-04-17 MED ORDER — SODIUM CHLORIDE 0.9 % IV SOLN: 375.0000 mg/m2 | Freq: Once | INTRAVENOUS | Status: DC

## 2017-04-17 MED ORDER — SODIUM CHLORIDE 0.9 % IV SOLN
800.0000 mg | Freq: Once | INTRAVENOUS | Status: AC
  Administered 2017-04-17: 800 mg via INTRAVENOUS
  Filled 2017-04-17: qty 50

## 2017-04-17 MED ORDER — SODIUM CHLORIDE 0.9 % IV SOLN
Freq: Once | INTRAVENOUS | Status: AC
  Administered 2017-04-17: 11:00:00 via INTRAVENOUS
  Filled 2017-04-17: qty 1000

## 2017-04-17 NOTE — Progress Notes (Signed)
[place] NOTE  Patient Care Team: Burnard Hawthorne, FNP as PCP - General (Family Medicine) Jackolyn Confer, MD (Internal Medicine)  CHIEF COMPLAINTS/PURPOSE OF CONSULTATION: MANTLE CELL LYMPHOMA  Oncology History   # JAN 2017- MANTLE CELL LYMPHOMA STAGE IV; [R Breast LN [place] Core Bx-1.2cm LN/R Ax LN-Bx]; cyclin D Pos; Mitotic rate-LOW; MIPI score [5/intermediate risk]; BMBx-Positive for involvement. Feb 9th- START Benda-Ritux with neulasta; Prolonged neutropenia; DISCONT- Benda-Ritux;   # April 13 th 2017- START R-CHOP x1; severe/prolonged neutropenia; PET- CR; BMBx-Neg; Disc R-CHOP  # 26th MAY 2017- Start Rituxan q 53M Main OCT 12th 2017- PET NED.   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  Her imaging has been postponed because of insurance approval.  She had a recent mammogram that showed-improvement of the breast lump/axillary lymph nodes [which initially led to the diagnosis of lymphoma]  Patient denies any new lumps or bumps. Appetite is good. No nausea no vomiting. No recent infections. She continues to be very active for age. Denies any shoulder pain. No headaches.  She continues to complain of arthritic pain in the bilateral knees.  ROS: A complete 10 point review of system is done which is negative except mentioned above in history of present illness  MEDICAL HISTORY:  Past Medical History:  Diagnosis Date  . Arthritis   . GERD (gastroesophageal reflux disease)   . Headache(784.0)   . History of methotrexate therapy   . Hyperlipidemia    hx  . Lymphadenopathy of head and neck 01/2015   see on Thyroid ultrasound  . Lymphoma, mantle cell ([place])  06/01/2015   bx of lymph node in right breast/Stage IV Mantle Cell Lymphoma  . Rheumatoid arthritis ([place])     SURGICAL HISTORY: Past Surgical History:  Procedure Laterality Date  . CARDIAC CATHETERIZATION  09/2004   [facility]; EF 60%  . CARDIAC CATHETERIZATION  08/2004   [facility]  . Porta Cath Insertion N/A 07/04/2015   Performed by [name], MD at [facility]    SOCIAL HISTORY: Social History   Socioeconomic History  . Marital status: Married    Spouse name: Not on file  . Number of children: Not on file  . Years of education: Not on file  . Highest education level: Not on file  Social Needs  . Financial resource strain: Not on file  . Food insecurity - worry: Not on file  . Food insecurity - inability: Not on file  . Transportation needs - medical: Not on file  . Transportation needs - non-medical: Not on file  Occupational History  . Not on file  Tobacco Use  . Smoking status: Never Smoker  . Smokeless tobacco: Never Used  Substance and Sexual Activity  . Alcohol use: No  . Drug use: No  . Sexual activity: No  Other Topics Concern  . 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    FAMILY HISTORY: Family History  Problem Relation Age of Onset  . Diabetes Mother   . Cholelithiasis Mother   .  Hypertension Sister   . Diabetes Sister   . Arthritis Brother   . Breast cancer Neg Hx     ALLERGIES:  has No Known Allergies.  MEDICATIONS:  Current Outpatient Medications  Medication Sig Dispense Refill  . acetaminophen (TYLENOL) 500 MG tablet Take 500 mg by mouth every 6 (six) hours as needed.    . Calcium Carbonate-Vit D-Min (CALTRATE 600+D PLUS) 600-800 MG-UNIT CHEW Chew 1 tablet by mouth 2 (two) times daily. 60 tablet 6  . cyanocobalamin 500 MCG tablet Take 500 mcg by mouth daily. Take two gummies daily    . fluticasone (FLONASE) 50 MCG/ACT nasal spray SPRAY TWICE IN EACH NOSTRIL  ONCE DAILY  4  . hydroxychloroquine (PLAQUENIL) 200 MG tablet TAKE 2 TABLETS (400 MG TOTAL) BY MOUTH DAILY. 180 tablet 0  . lidocaine-prilocaine (EMLA) cream Apply 1 application topically as needed. 30 g 11  . meclizine (ANTIVERT) 25 MG tablet Take 25 mg by mouth 3 (three) times daily as needed for dizziness.    . Multiple Vitamins-Minerals (MULTIVITAMIN GUMMIES ADULT) CHEW Chew by mouth. Reported on 09/06/2015    . Omega-3 1000 MG CAPS Take by mouth.    [name] omeprazole (PRILOSEC) 40 MG capsule Take 40 mg by mouth daily.     . pravastatin (PRAVACHOL) 40 MG tablet Take 1 tablet (40 mg total) by mouth daily. 90 tablet 2  . PREVIDENT 0.2 % SOLN RINSE AFTER THOROUGH BRUSHING  10  . ondansetron (ZOFRAN) 8 MG tablet Take 1 tablet (8 mg total) by mouth every 8 (eight) hours as needed for nausea or vomiting (start 3 days; after chemo). (Patient not taking: Reported on 04/17/2017) 40 tablet 0  . oxyCODONE (OXY IR/ROXICODONE) 5 MG immediate release tablet Take 5 mg by mouth every 4 (four) hours as needed for severe pain.    [name] prochlorperazine (COMPAZINE) 10 MG tablet Take 1 tablet (10 mg total) by mouth every 6 (six) hours as needed for nausea or vomiting. (Patient not taking: Reported on 04/17/2017) 30 tablet 0   No current facility-administered medications for this visit.    Facility-Administered Medications Ordered in Other Visits  Medication Dose Route Frequency Provider Last Rate Last Dose  . Tbo-Filgrastim (GRANIX) injection 480 mcg  480 mcg Subcutaneous Once Charlaine Dalton R, MD          .  PHYSICAL EXAMINATION: ECOG PERFORMANCE STATUS: 0 - Asymptomatic  Vitals:   04/17/17 1034  BP: 120/81  Pulse: 69  Resp: 16  Temp: (!) 97.4 F (36.3 C)   Filed Weights   04/17/17 1034  Weight: 253 lb 9.6 oz (115 kg)    GENERAL: Well-nourished well-developed; Alert, no distress and comfortable. Accompanied by her Sister.  EYES: no pallor or icterus OROPHARYNX: no thrush or ulceration;  NECK:  supple, no masses felt LYMPH:  lymphadenopathy in the cervical, axillary/inguinal lymph nodes resolved. LUNGS: clear to auscultation and No wheeze or crackles HEART/CVS: regular rate & rhythm and no murmurs; No lower extremity edema ABDOMEN: abdomen soft, non-tender and normal bowel sounds; no hepato-splenomegaly.  Musculoskeletal:no cyanosis of digits and no clubbing;  PSYCH: alert & oriented x 3 with fluent speech NEURO: no focal motor/sensory deficits SKIN: no rashes or significant lesions  LABORATORY DATA:  I have reviewed the data as listed Lab Results  Component Value Date   WBC 6.4 04/17/2017   HGB 13.1 04/17/2017   HCT 40.4 04/17/2017   MCV 90.4 04/17/2017   PLT 162 04/17/2017   Recent Labs  12/19/16 1014 02/20/17 0907 04/17/17 0952  NA 139 138 138  K 4.0 3.8 3.7  CL 106 106 105  CO2 26 25 24   GLUCOSE 93 103* 112*  BUN 15 14 12   CREATININE 0.76 0.74 0.75  CALCIUM 9.0 8.9 8.7*  GFRNONAA >60 >60 >60  GFRAA >60 >60 >60  PROT 7.0 6.8 6.9  ALBUMIN 3.9 3.8 3.9  AST 23 20 23   ALT 12* 13* 12*  ALKPHOS 80 68 73  BILITOT 0.7 0.7 0.7   IMPRESSION: 1. No findings of recurrent lymphoma. 2.  Aortic Atherosclerosis (ICD10-I70.0). 3. 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   Electronically Signed   By: Van Clines M.D.   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 continue with for a total of 2 years.  Rituximab every 8 weeks; Tolerating Rituxan  very well. Labs today reviewed;  acceptable for treatment today.   # Chronic arthritis- okay with NSAIDs as needed.  However did discuss regarding nephrotoxicity/gastritis on long-term use.  # follow up in 8 weeks/rituximab infusion/labs PET scan next week- will give a call with results.  If PET scan not approved will get a CT scan.  Cc:Dr.Cook.     Cammie Sickle, MD 04/17/2017 8:50 PM

## 2017-04-17 NOTE — Assessment & Plan Note (Addendum)
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 continue with for a total of 2 years.  Rituximab every 8 weeks; Tolerating Rituxan very well. Labs today reviewed;  acceptable for treatment today.   # Chronic arthritis- okay with NSAIDs as needed.  However did discuss regarding nephrotoxicity/gastritis on long-term use.  # follow up in 8 weeks/rituximab infusion/labs PET scan next week- will give a call with results.  If PET scan not approved will get a CT scan.  Cc:Dr.Cook.

## 2017-04-22 ENCOUNTER — Ambulatory Visit
Admission: RE | Admit: 2017-04-22 | Discharge: 2017-04-22 | Disposition: A | Discharge status: Home / Self Care | Payer: Medicare HMO | Source: Ambulatory Visit | Attending: Internal Medicine | Admitting: Internal Medicine

## 2017-04-22 DIAGNOSIS — I700 Atherosclerosis of aorta: Secondary | ICD-10-CM | POA: Insufficient documentation

## 2017-04-22 DIAGNOSIS — C8311 Mantle cell lymphoma, lymph nodes of head, face, and neck: Secondary | ICD-10-CM

## 2017-04-22 DIAGNOSIS — M5137 Other intervertebral disc degeneration, lumbosacral region: Secondary | ICD-10-CM | POA: Insufficient documentation

## 2017-04-22 DIAGNOSIS — Q433 Congenital malformations of intestinal fixation: Secondary | ICD-10-CM | POA: Diagnosis not present

## 2017-04-22 DIAGNOSIS — C8310 Mantle cell lymphoma, unspecified site: Secondary | ICD-10-CM | POA: Diagnosis not present

## 2017-04-22 DIAGNOSIS — I517 Cardiomegaly: Secondary | ICD-10-CM | POA: Insufficient documentation

## 2017-04-22 HISTORY — DX: Systemic involvement of connective tissue, unspecified: M35.9

## 2017-04-22 MED ORDER — IOPAMIDOL (ISOVUE-300) INJECTION 61%
100.0000 mL | Freq: Once | INTRAVENOUS | Status: AC | PRN
  Administered 2017-04-22: 100 mL via INTRAVENOUS

## 2017-06-03 ENCOUNTER — Encounter: Payer: Self-pay | Admitting: Family

## 2017-06-03 NOTE — Telephone Encounter (Signed)
Please advise 

## 2017-06-03 NOTE — Telephone Encounter (Signed)
I cannot write this letter since I have not seen this patient before.  Can she make an appointment ?

## 2017-06-03 NOTE — Telephone Encounter (Signed)
Copied from [place]. 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 Please call company to get this arranged.

## 2017-06-05 NOTE — Telephone Encounter (Signed)
Left message for patient to return call. PEC can inform patient/make appointment.

## 2017-06-13 ENCOUNTER — Other Ambulatory Visit: Payer: Self-pay | Admitting: Family

## 2017-06-13 DIAGNOSIS — M069 Rheumatoid arthritis, unspecified: Secondary | ICD-10-CM

## 2017-06-17 ENCOUNTER — Encounter: Payer: Self-pay | Admitting: Family

## 2017-06-17 NOTE — Telephone Encounter (Signed)
This encounter was created in error - please disregard.

## 2017-06-19 ENCOUNTER — Other Ambulatory Visit: Payer: Self-pay

## 2017-06-19 ENCOUNTER — Inpatient Hospital Stay (HOSPITAL_BASED_OUTPATIENT_CLINIC_OR_DEPARTMENT_OTHER): Payer: Medicare HMO | Admitting: Internal Medicine

## 2017-06-19 ENCOUNTER — Inpatient Hospital Stay: Payer: Medicare HMO | Attending: Internal Medicine

## 2017-06-19 ENCOUNTER — Encounter: Payer: Self-pay | Admitting: Internal Medicine

## 2017-06-19 ENCOUNTER — Inpatient Hospital Stay: Payer: Medicare HMO

## 2017-06-19 VITALS — BP 155/89 | HR 71 | Temp 97.9°F | Resp 18 | Ht 66.0 in | Wt 247.0 lb

## 2017-06-19 DIAGNOSIS — M13862 Other specified arthritis, left knee: Secondary | ICD-10-CM | POA: Diagnosis not present

## 2017-06-19 DIAGNOSIS — M13861 Other specified arthritis, right knee: Secondary | ICD-10-CM | POA: Diagnosis not present

## 2017-06-19 DIAGNOSIS — Z5112 Encounter for antineoplastic immunotherapy: Secondary | ICD-10-CM | POA: Insufficient documentation

## 2017-06-19 DIAGNOSIS — C8318 Mantle cell lymphoma, lymph nodes of multiple sites: Secondary | ICD-10-CM | POA: Insufficient documentation

## 2017-06-19 DIAGNOSIS — C8311 Mantle cell lymphoma, lymph nodes of head, face, and neck: Secondary | ICD-10-CM

## 2017-06-19 LAB — COMPREHENSIVE METABOLIC PANEL
ALT: 12 U/L — ABNORMAL LOW (ref 14–54)
AST: 24 U/L (ref 15–41)
Albumin: 3.7 g/dL (ref 3.5–5.0)
Alkaline Phosphatase: 70 U/L (ref 38–126)
Anion gap: 9 (ref 5–15)
BUN: 14 mg/dL (ref 6–20)
CO2: 25 mmol/L (ref 22–32)
Calcium: 9 mg/dL (ref 8.9–10.3)
Chloride: 104 mmol/L (ref 101–111)
Creatinine, Ser: 0.74 mg/dL (ref 0.44–1.00)
GFR calc Af Amer: >60 mL/min (ref >60)
GFR calc non Af Amer: >60 mL/min (ref >60)
Glucose, Bld: 128 mg/dL — ABNORMAL HIGH (ref 65–99)
Potassium: 3.8 mmol/L (ref 3.5–5.1)
Sodium: 138 mmol/L (ref 135–145)
Total Bilirubin: 0.7 mg/dL (ref 0.3–1.2)
Total Protein: 6.9 g/dL (ref 6.5–8.1)

## 2017-06-19 LAB — CBC WITH DIFFERENTIAL/PLATELET
Basophils Absolute: 0 10*3/uL (ref 0–0.1)
Basophils Relative: 1 %
Eosinophils Absolute: 0.1 10*3/uL (ref 0–0.7)
Eosinophils Relative: 1 %
HCT: 40.8 % (ref 35.0–47.0)
Hemoglobin: 13.4 g/dL (ref 12.0–16.0)
Lymphocytes Relative: 16 %
Lymphs Abs: 1 10*3/uL (ref 1.0–3.6)
MCH: 29.4 pg (ref 26.0–34.0)
MCHC: 32.8 g/dL (ref 32.0–36.0)
MCV: 89.5 fL (ref 80.0–100.0)
Monocytes Absolute: 0.4 10*3/uL (ref 0.2–0.9)
Monocytes Relative: 6 %
Neutro Abs: 4.7 10*3/uL (ref 1.4–6.5)
Neutrophils Relative %: 76 %
Platelets: 180 10*3/uL (ref 150–440)
RBC: 4.56 MIL/uL (ref 3.80–5.20)
RDW: 15.2 % — ABNORMAL HIGH (ref 11.5–14.5)
WBC: 6.1 10*3/uL (ref 3.6–11.0)

## 2017-06-19 LAB — LACTATE DEHYDROGENASE: LDH: 190 U/L (ref 98–192)

## 2017-06-19 MED ORDER — ACETAMINOPHEN 325 MG PO TABS
650.0000 mg | ORAL_TABLET | Freq: Once | ORAL | Status: AC
  Administered 2017-06-19: 650 mg via ORAL
  Filled 2017-06-19: qty 2

## 2017-06-19 MED ORDER — HEPARIN SOD (PORK) LOCK FLUSH 100 UNIT/ML IV SOLN
500.0000 [IU] | Freq: Once | INTRAVENOUS | Status: AC | PRN
  Administered 2017-06-19: 500 [IU]
  Filled 2017-06-19: qty 5

## 2017-06-19 MED ORDER — SODIUM CHLORIDE 0.9 % IV SOLN
Freq: Once | INTRAVENOUS | Status: AC
  Administered 2017-06-19: 12:00:00 via INTRAVENOUS
  Filled 2017-06-19: qty 1000

## 2017-06-19 MED ORDER — SODIUM CHLORIDE 0.9% FLUSH
10.0000 mL | INTRAVENOUS | Status: DC | PRN
  Administered 2017-06-19: 10 mL
  Filled 2017-06-19: qty 10

## 2017-06-19 MED ORDER — SODIUM CHLORIDE 0.9 % IV SOLN: 375.0000 mg/m2 | Freq: Once | INTRAVENOUS | Status: DC

## 2017-06-19 MED ORDER — DIPHENHYDRAMINE HCL 25 MG PO CAPS
50.0000 mg | ORAL_CAPSULE | Freq: Once | ORAL | Status: AC
  Administered 2017-06-19: 50 mg via ORAL
  Filled 2017-06-19: qty 2

## 2017-06-19 MED ORDER — SODIUM CHLORIDE 0.9 % IV SOLN
800.0000 mg | Freq: Once | INTRAVENOUS | Status: AC
  Administered 2017-06-19: 800 mg via INTRAVENOUS
  Filled 2017-06-19: qty 50

## 2017-06-19 NOTE — Progress Notes (Signed)
[facility] NOTE  Patient Care Team: Burnard Hawthorne, FNP as PCP - General (Family Medicine) Jackolyn Confer, MD (Internal Medicine)  CHIEF COMPLAINTS/PURPOSE OF CONSULTATION: MANTLE CELL LYMPHOMA  Oncology History   # JAN 2017- MANTLE CELL LYMPHOMA STAGE IV; [R Breast LN [place] Core Bx-1.2cm LN/R Ax LN-Bx]; cyclin D Pos; Mitotic rate-LOW; MIPI score [5/intermediate risk]; BMBx-Positive for involvement. Feb 9th- START Benda-Ritux with neulasta; Prolonged neutropenia; DISCONT- Benda-Ritux;   # April 13 th 2017- START R-CHOP x1; severe/prolonged neutropenia; PET- CR; BMBx-Neg; Disc R-CHOP  # 26th MAY 2017- Start Rituxan q 61M Main OCT 12th 2017- PET NED.   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   Patient denies any new lumps or bumps. Appetite is good. No nausea no vomiting. No recent infections. She continues to be very active for age. No headaches.  She continues to complain of arthritic pain in the bilateral knees.  Her shoulder pain is improved.  ROS: A complete 10 point review of system is done which is negative except mentioned above in history of present illness  MEDICAL HISTORY:  Past Medical History:  Diagnosis Date  . Arthritis   . Collagen vascular disease ([place])   . GERD (gastroesophageal reflux disease)   . Headache(784.0)   . History of methotrexate therapy   . Hyperlipidemia    hx  . Lymphadenopathy of head and neck 01/2015   see on Thyroid ultrasound  . Lymphoma, mantle cell ([place]) 06/01/2015   bx of lymph node in right breast/Stage IV Mantle Cell Lymphoma  . Rheumatoid arthritis ([place])     SURGICAL HISTORY: Past Surgical History:  Procedure  Laterality Date  . CARDIAC CATHETERIZATION  09/2004   [facility]; EF 60%  . CARDIAC CATHETERIZATION  08/2004   [facility]  . PERIPHERAL VASCULAR CATHETERIZATION N/A 07/04/2015   Procedure: [name] Insertion;  Surgeon: [name], MD;  Location: [facility];  Service: Cardiovascular;  Laterality: N/A;    SOCIAL HISTORY: Social History   Socioeconomic History  . Marital status: Married    Spouse name: Not on file  . Number of children: Not on file  . Years of education: Not on file  . Highest education level: Not on file  Social Needs  . Financial resource strain: Not on file  . Food insecurity - worry: Not on file  . Food insecurity - inability: Not on file  . Transportation needs - medical: Not on file  . Transportation needs - non-medical: Not on file  Occupational History  . Not on file  Tobacco Use  . Smoking status: Never Smoker  . Smokeless tobacco: Never Used  Substance and Sexual Activity  . Alcohol use: No  . Drug use: No  . Sexual activity: No  Other Topics Concern  . 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    FAMILY HISTORY: Family History  Problem Relation Age of Onset  . Diabetes Mother   . Cholelithiasis Mother   . Hypertension Sister   . Diabetes Sister   .  Arthritis Brother   . Breast cancer Neg Hx     ALLERGIES:  has No Known Allergies.  MEDICATIONS:  Current Outpatient Medications  Medication Sig Dispense Refill  . Calcium Carbonate-Vit D-Min (CALTRATE 600+D PLUS) 600-800 MG-UNIT CHEW Chew 1 tablet by mouth 2 (two) times daily. 60 tablet 6  . cyanocobalamin 500 MCG tablet Take 500 mcg by mouth daily. Take two gummies daily    . fluticasone (FLONASE) 50 MCG/ACT nasal spray SPRAY TWICE IN EACH NOSTRIL ONCE DAILY  4  . hydroxychloroquine (PLAQUENIL) 200 MG tablet Take 2 tablets (400 mg total) by mouth daily. Follow-up appt needed. Please call office for appt. 180  tablet 0  . lidocaine-prilocaine (EMLA) cream Apply 1 application topically as needed. 30 g 11  . meclizine (ANTIVERT) 25 MG tablet Take 25 mg by mouth 3 (three) times daily as needed for dizziness.    . Multiple Vitamins-Minerals (MULTIVITAMIN GUMMIES ADULT) CHEW Chew by mouth. Reported on 09/06/2015    . Omega-3 1000 MG CAPS Take by mouth.    [name] omeprazole (PRILOSEC) 40 MG capsule Take 40 mg by mouth daily.     . pravastatin (PRAVACHOL) 40 MG tablet Take 1 tablet (40 mg total) by mouth daily. 90 tablet 2  . PREVIDENT 0.2 % SOLN RINSE AFTER THOROUGH BRUSHING  10  . acetaminophen (TYLENOL) 500 MG tablet Take 500 mg by mouth every 6 (six) hours as needed.    . ondansetron (ZOFRAN) 8 MG tablet Take 1 tablet (8 mg total) by mouth every 8 (eight) hours as needed for nausea or vomiting (start 3 days; after chemo). (Patient not taking: Reported on 04/17/2017) 40 tablet 0  . oxyCODONE (OXY IR/ROXICODONE) 5 MG immediate release tablet Take 5 mg by mouth every 4 (four) hours as needed for severe pain.    [name] prochlorperazine (COMPAZINE) 10 MG tablet Take 1 tablet (10 mg total) by mouth every 6 (six) hours as needed for nausea or vomiting. (Patient not taking: Reported on 06/19/2017) 30 tablet 0   No current facility-administered medications for this visit.    Facility-Administered Medications Ordered in Other Visits  Medication Dose Route Frequency Provider Last Rate Last Dose  . heparin lock flush 100 unit/mL  500 Units Intracatheter Once PRN Charlaine Dalton R, MD      . sodium chloride flush (NS) 0.9 % injection 10 mL  10 mL Intracatheter PRN Cammie Sickle, MD   10 mL at 06/19/17 1041  . Tbo-Filgrastim (GRANIX) injection 480 mcg  480 mcg Subcutaneous Once Cammie Sickle, MD          .  PHYSICAL EXAMINATION: ECOG PERFORMANCE STATUS: 0 - Asymptomatic  Vitals:   06/19/17 1045  BP: (!) 155/89  Pulse: 71  Resp: 18  Temp: 97.9 F (36.6 C)   Filed Weights   06/19/17 1055  Weight:  247 lb (112 kg)    GENERAL: Well-nourished well-developed; Alert, no distress and comfortable. Accompanied by her husband.  EYES: no pallor or icterus OROPHARYNX: no thrush or ulceration;  NECK: supple, no masses felt LYMPH:  lymphadenopathy in the cervical, axillary/inguinal lymph nodes resolved. LUNGS: clear to auscultation and No wheeze or crackles HEART/CVS: regular rate & rhythm and no murmurs; No lower extremity edema ABDOMEN: abdomen soft, non-tender and normal bowel sounds; no hepato-splenomegaly.  Musculoskeletal:no cyanosis of digits and no clubbing;  PSYCH: alert & oriented x 3 with fluent speech NEURO: no focal motor/sensory deficits SKIN: no rashes or significant lesions  LABORATORY DATA:  I  have reviewed the data as listed Lab Results  Component Value Date   WBC 6.1 06/19/2017   HGB 13.4 06/19/2017   HCT 40.8 06/19/2017   MCV 89.5 06/19/2017   PLT 180 06/19/2017   Recent Labs    02/20/17 0907 04/17/17 0952 06/19/17 1034  NA 138 138 138  K 3.8 3.7 3.8  CL 106 105 104  CO2 25 24 25   GLUCOSE 103* 112* 128*  BUN 14 12 14   CREATININE 0.74 0.75 0.74  CALCIUM 8.9 8.7* 9.0  GFRNONAA >60 >60 >60  GFRAA >60 >60 >60  PROT 6.8 6.9 6.9  ALBUMIN 3.8 3.9 3.7  AST 20 23 24   ALT 13* 12* 12*  ALKPHOS 68 73 70  BILITOT 0.7 0.7 0.7   IMPRESSION: 1. No findings of recurrent lymphoma. 2.  Aortic Atherosclerosis (ICD10-I70.0). 3. 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   Electronically Signed   By: Van Clines M.D.   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 Continue with for a total of 2 years [? May 2019].  Rituximab every 8 weeks; Tolerating Rituxan very well. Labs today reviewed;  acceptable for treatment today.   # Chronic arthritis- okay with NSAIDs as needed.   # follow up in 2 months/labs/LDH.   # I reviewed the blood work- with the patient in detail; also reviewed the imaging independently [as summarized above]; and with the patient in detail.   Cc:Dr.Cook.     Cammie Sickle, MD 06/19/2017 12:07 PM

## 2017-06-19 NOTE — Assessment & Plan Note (Addendum)
#   MANTLE CELL LYMPHOMA- STAGE IV-poor tolerance to chemotherapy sec to severe neutropenia [R-benda x1 & R-CHOP x1]-  Currently on maintenance Rituxan since May 2017; NOv 11th 2018-CT NED [no evidence of any recurrence or any progression of disease.]   # Clinically doing well; no concerns of progression. Continue with for a total of 2 years [? May 2019].  Rituximab every 8 weeks; Tolerating Rituxan very well. Labs today reviewed;  acceptable for treatment today.   # Chronic arthritis- okay with NSAIDs as needed.   # follow up in 2 months/labs/LDH.   # I reviewed the blood work- with the patient in detail; also reviewed the imaging independently [as summarized above]; and with the patient in detail.   Cc:Dr.Cook.

## 2017-06-25 ENCOUNTER — Ambulatory Visit (INDEPENDENT_AMBULATORY_CARE_PROVIDER_SITE_OTHER): Payer: Medicare HMO | Admitting: Internal Medicine

## 2017-06-25 DIAGNOSIS — E785 Hyperlipidemia, unspecified: Secondary | ICD-10-CM

## 2017-06-26 ENCOUNTER — Encounter: Payer: Self-pay | Admitting: Internal Medicine

## 2017-06-26 NOTE — Progress Notes (Signed)
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 Gayland Curry

## 2017-06-29 ENCOUNTER — Other Ambulatory Visit: Payer: Self-pay | Admitting: Family

## 2017-06-29 DIAGNOSIS — C8311 Mantle cell lymphoma, lymph nodes of head, face, and neck: Secondary | ICD-10-CM

## 2017-07-02 ENCOUNTER — Telehealth: Payer: Self-pay | Admitting: *Deleted

## 2017-07-02 ENCOUNTER — Inpatient Hospital Stay (HOSPITAL_BASED_OUTPATIENT_CLINIC_OR_DEPARTMENT_OTHER): Payer: Medicare HMO | Admitting: Nurse Practitioner

## 2017-07-02 ENCOUNTER — Encounter: Payer: Self-pay | Admitting: Nurse Practitioner

## 2017-07-02 ENCOUNTER — Ambulatory Visit: Payer: Self-pay | Admitting: *Deleted

## 2017-07-02 ENCOUNTER — Other Ambulatory Visit: Payer: Self-pay

## 2017-07-02 VITALS — BP 107/61 | HR 87 | Temp 98.7°F | Wt 250.0 lb

## 2017-07-02 DIAGNOSIS — Z5112 Encounter for antineoplastic immunotherapy: Secondary | ICD-10-CM | POA: Diagnosis not present

## 2017-07-02 DIAGNOSIS — C8311 Mantle cell lymphoma, lymph nodes of head, face, and neck: Secondary | ICD-10-CM

## 2017-07-02 DIAGNOSIS — R220 Localized swelling, mass and lump, head: Secondary | ICD-10-CM | POA: Diagnosis not present

## 2017-07-02 DIAGNOSIS — C8318 Mantle cell lymphoma, lymph nodes of multiple sites: Secondary | ICD-10-CM | POA: Diagnosis not present

## 2017-07-02 MED ORDER — ALBUTEROL SULFATE HFA 108 (90 BASE) MCG/ACT IN AERS: 2.0000 | INHALATION_SPRAY | Freq: Four times a day (QID) | RESPIRATORY_TRACT | 0 refills | Status: DC | PRN

## 2017-07-02 MED ORDER — PREDNISONE 10 MG (21) PO TBPK: ORAL_TABLET | ORAL | 0 refills | Status: DC

## 2017-07-02 NOTE — Telephone Encounter (Signed)
Patient is at Oncology office now for evaluation FYI

## 2017-07-02 NOTE — Progress Notes (Signed)
Symptom Management Consult note [facility]  [phone] [phone]  Patient Care Team: Burnard Hawthorne, FNP as PCP - General (Family Medicine) Jackolyn Confer, MD (Internal Medicine)   Name of the patient: Michelle [name]  [id number]  [birth year]   Date of visit: 07/02/17  Diagnosis-stage IV mantle cell lymphoma  Chief complaint/ Reason for visit- facial/arm swelling  Heme/Onc history: Patient last evaluated by primary oncologist, Dr. Rogue Bussing, on 06/19/17.   # JAN 2017- MANTLE CELL LYMPHOMA STAGE IV; [R Breast LN [place] Core Bx-1.2cm LN/R Ax LN-Bx]; cyclin D Pos; Mitotic rate-LOW; MIPI score [5/intermediate risk]; BMBx-Positive for involvement. Feb 9th- START Benda-Ritux with neulasta; Prolonged neutropenia; DISCONT- Benda-Ritux;   # April 13 th 2017- START R-CHOP x1; severe/prolonged neutropenia; PET- CR; BMBx-Neg; Disc R-CHOP  # 26th MAY 2017- Start Rituxan q 39M Main OCT 12th 2017- PET NED.   # Rheumatoid Arthritis [on MXT]; March 2017-MUGA scan-51 %  Interval history- Patient presents to Symptom Management Clinic today for complaints of facial/arm swelling. Symptoms started 3 days ago and have gradually worsened. States that swelling is 'noticeable' and describes as mild. Denies itching. Endorses mild pain on side of neck. Denies fever. Is unsure what is causing her symptoms but has recently re-started on Plaquenil for Rheumatoid arthritis but has taken previously without complications.  Patient is concerned that swelling represents recurrence of lymphoma.  ECOG FS:0 - Asymptomatic  Review of systems- Review of Systems  Constitutional: Negative.   Eyes: Negative.   Respiratory: Negative.  Negative for cough, shortness of breath and wheezing.   Cardiovascular: Negative.  Negative for chest pain, palpitations, orthopnea, claudication and leg swelling.  Gastrointestinal: Negative.   Genitourinary: Negative.   Musculoskeletal:  Negative.   Skin:       Swelling of arms and face  Neurological: Negative.   Endo/Heme/Allergies: Negative.   Psychiatric/Behavioral: The patient is nervous/anxious.      Current treatment- Rituxan q8 weeks (last 06/19/2017)  No Known Allergies   Past Medical History:  Diagnosis Date  . Arthritis   . Collagen vascular disease ([place])   . GERD (gastroesophageal reflux disease)   . Headache(784.0)   . History of methotrexate therapy   . Hyperlipidemia    hx  . Lymphadenopathy of head and neck 01/2015   see on Thyroid ultrasound  . Lymphoma, mantle cell ([place]) 06/01/2015   bx of lymph node in right breast/Stage IV Mantle Cell Lymphoma  . Rheumatoid arthritis [facility])      Past Surgical History:  Procedure Laterality Date  . CARDIAC CATHETERIZATION  09/2004   [facility]; EF 60%  . CARDIAC CATHETERIZATION  08/2004   [facility]  . PERIPHERAL VASCULAR CATHETERIZATION N/A 07/04/2015   Procedure: [name] Insertion;  Surgeon: [name], MD;  Location: [facility];  Service: Cardiovascular;  Laterality: N/A;    Social History   Socioeconomic History  . Marital status: Married    Spouse name: Not on file  . Number of children: Not on file  . Years of education: Not on file  . Highest education level: Not on file  Social Needs  . Financial resource strain: Not on file  . Food insecurity - worry: Not on file  . Food insecurity - inability: Not on file  . Transportation needs - medical: Not on file  . Transportation needs - non-medical: Not on file  Occupational History  . Not on file  Tobacco Use  . Smoking status: Never Smoker  .  Smokeless tobacco: Never Used  Substance and Sexual Activity  . Alcohol use: No  . Drug use: No  . Sexual activity: No  Other Topics Concern  . 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    Family History  Problem Relation Age of Onset  . Diabetes Mother   .  Cholelithiasis Mother   . Hypertension Sister   . Diabetes Sister   . Arthritis Brother   . Breast cancer Neg Hx      Current Outpatient Medications:  .  acetaminophen (TYLENOL) 500 MG tablet, Take 500 mg by mouth every 6 (six) hours as needed., Disp: , Rfl:  .  Calcium Carbonate-Vit D-Min (CALTRATE 600+D PLUS) 600-800 MG-UNIT CHEW, Chew 1 tablet by mouth 2 (two) times daily., Disp: 60 tablet, Rfl: 6 .  cyanocobalamin 500 MCG tablet, Take 500 mcg by mouth daily. Take two gummies daily, Disp: , Rfl:  .  fluticasone (FLONASE) 50 MCG/ACT nasal spray, SPRAY TWICE IN EACH NOSTRIL ONCE DAILY, Disp: , Rfl: 4 .  hydroxychloroquine (PLAQUENIL) 200 MG tablet, Take 2 tablets (400 mg total) by mouth daily. Follow-up appt needed. Please call office for appt., Disp: 180 tablet, Rfl: 0 .  lidocaine-prilocaine (EMLA) cream, Apply 1 application topically as needed., Disp: 30 g, Rfl: 11 .  meclizine (ANTIVERT) 25 MG tablet, Take 25 mg by mouth 3 (three) times daily as needed for dizziness., Disp: , Rfl:  .  Multiple Vitamins-Minerals (MULTIVITAMIN GUMMIES ADULT) CHEW, Chew by mouth. Reported on 09/06/2015, Disp: , Rfl:  .  Omega-3 1000 MG CAPS, Take by mouth., Disp: , Rfl:  .  omeprazole (PRILOSEC) 40 MG capsule, TAKE 1 CAPSULE BY MOUTH ONCE A DAY, Disp: 90 capsule, Rfl: 1 .  ondansetron (ZOFRAN) 8 MG tablet, Take 1 tablet (8 mg total) by mouth every 8 (eight) hours as needed for nausea or vomiting (start 3 days; after chemo)., Disp: 40 tablet, Rfl: 0 .  oxyCODONE (OXY IR/ROXICODONE) 5 MG immediate release tablet, Take 5 mg by mouth every 4 (four) hours as needed for severe pain., Disp: , Rfl:  .  PREVIDENT 0.2 % SOLN, RINSE AFTER THOROUGH BRUSHING, Disp: , Rfl: 10 .  prochlorperazine (COMPAZINE) 10 MG tablet, Take 1 tablet (10 mg total) by mouth every 6 (six) hours as needed for nausea or vomiting., Disp: 30 tablet, Rfl: 0 .  pravastatin (PRAVACHOL) 40 MG tablet, Take 1 tablet (40 mg total) by mouth daily.  (Patient not taking: Reported on 07/02/2017), Disp: 90 tablet, Rfl: 2 No current facility-administered medications for this visit.   Facility-Administered Medications Ordered in Other Visits:  .  Tbo-Filgrastim (GRANIX) injection 480 mcg, 480 mcg, Subcutaneous, Once, Cammie Sickle, MD  Physical exam:  Vitals:   07/02/17 1500  BP: 107/61  Pulse: 87  Temp: 98.7 F (37.1 C)  TempSrc: Oral  Weight: 250 lb (113.4 kg)   Physical Exam  Constitutional: She is oriented to person, place, and time and well-developed, well-nourished, and in no distress. No distress.  HENT:  Head: Normocephalic and atraumatic.  Nose: No rhinorrhea.  Mouth/Throat: Uvula is midline and oropharynx is clear and moist. No uvula swelling. No posterior oropharyngeal edema or posterior oropharyngeal erythema.  Eyes: Conjunctivae are normal. Pupils are equal, round, and reactive to light. No scleral icterus.  Neck: Normal range of motion. Neck supple. No neck rigidity. Edema present. No erythema and normal  range of motion present.  Cardiovascular: Normal rate, regular rhythm and normal heart sounds.  Pulmonary/Chest: Effort normal and breath sounds normal. No stridor. No respiratory distress. She has no wheezes. She has no rales.  Musculoskeletal: Normal range of motion. She exhibits no tenderness or deformity.  Lymphadenopathy:    She has no cervical adenopathy.  Neurological: She is alert and oriented to person, place, and time.  Skin: Skin is warm and dry.  Psychiatric: Affect normal.     CMP Latest Ref Rng & Units 06/19/2017  Glucose 65 - 99 mg/dL 128(H)  BUN 6 - 20 mg/dL 14  Creatinine 0.44 - 1.00 mg/dL 0.74  Sodium 135 - 145 mmol/L 138  Potassium 3.5 - 5.1 mmol/L 3.8  Chloride 101 - 111 mmol/L 104  CO2 22 - 32 mmol/L 25  Calcium 8.9 - 10.3 mg/dL 9.0  Total Protein 6.5 - 8.1 g/dL 6.9  Total Bilirubin 0.3 - 1.2 mg/dL 0.7  Alkaline Phos 38 - 126 U/L 70  AST 15 - 41 U/L 24  ALT 14 - 54 U/L 12(L)    CBC Latest Ref Rng & Units 06/19/2017  WBC 3.6 - 11.0 K/uL 6.1  Hemoglobin 12.0 - 16.0 g/dL 13.4  Hematocrit 35.0 - 47.0 % 40.8  Platelets 150 - 440 K/uL 180   IMPRESSION: 1. No findings of recurrent lymphoma. 2. Aortic Atherosclerosis (ICD10-I70.0). 3. 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   Electronically Signed By: Van Clines M.D. On: 10/09/2016 11:44  IMPRESSION: 1. No current pathologic adenopathy in the chest, abdomen, or pelvis to suggest active lymphoma. 2. Small fibrin sheath along the distal margin of the Port-A-Cath. This is probably clinically incidental but if there is difficulty flushing or utilizing the catheter within this could be further assessed by interventional radiology. 3. Aortic Atherosclerosis (ICD10-I70.0).  Mild cardiomegaly. 4. Bilateral degenerative glenohumeral arthropathy with free osteochondral fragments in both joints. 5. Impingement at L4-5 at L5-S1 due to spondylosis and degenerative disc disease. 6. Mobile cecum extends up into the left upper quadrant adjacent to the stomach.   Electronically Signed   By: Van Clines M.D.   On: 04/22/2017 16:21  Assessment and plan- Patient is a 77 y.o. female who presents to symptom management clinic today for evaluation of swelling of the face and arms.  1.  Mantle cell lymphoma-stage IV-previous patient exhibited poor tolerance to chemotherapy secondary to severe neutropenia.  She received 1 cycle R-benda & R-CHOP x1 prior to discontinuation.  Currently on maintenance Rituxan since May 2017.  April 12, 2017-CT shows no evidence of recurrence or progression of disease.  Currently receiving Rituxan every 8 weeks with plan to continue for a total of 2 years with estimated completion in May 2019.  Recently evaluated by Dr. Rogue Bussing on  06/19/17 and received Rituxan at that time.  Labs at that time and clinical exam show no evidence of recurrence.  2.  Face/Arm Swelling- unclear etiology?  Swelling is mild, not pruritic, and generalized and affected areas.  No erythema.  No palpable lymph nodes.  Appearance not consistent with recurrence of lymphoma.  No wheezing. No SOB. Fluent speech. Has previously tolerated Plaquenil well.  Suspect contact dermatitis.  Will give prednisone taper for symptoms and provide with albuterol inhaler for patient reports of wheezing.  Provided with ER precautions.  Encouraged to return to clinic if symptoms do not improve or worsen in the next 1-2 days.  Told to follow-up with rheumatologist regarding Plaquenil.   Visit Diagnosis 1. Mantle cell lymphoma of lymph nodes of head, face, and neck ([place])   2. Swelling of face     Patient expressed understanding and was in agreement with this plan. She also understands that She can call clinic at any time with any questions, concerns, or complaints.    Beckey Rutter, DNP, AGNP-C [place] [facility] [phone] (office) 07/17/17  10:56 AM

## 2017-07-02 NOTE — Patient Instructions (Addendum)
Please notify me if you've had no improvement in your symptoms, or symptoms become worse, in the next 3-4 days.   Prednisone tablets What is this medicine? PREDNISONE (PRED ni sone) is a corticosteroid. It is commonly used to treat inflammation of the skin, joints, lungs, and other organs. Common conditions treated include asthma, allergies, and arthritis. It is also used for other conditions, such as blood disorders and diseases of the adrenal glands. This medicine may be used for other purposes; ask your health care provider or pharmacist if you have questions. COMMON BRAND NAME(S): Deltasone, Predone, Sterapred, Sterapred DS What should I tell my health care provider before I take this medicine? They need to know if you have any of these conditions: -Cushing's syndrome -diabetes -glaucoma -heart disease -high blood pressure -infection (especially a virus infection such as chickenpox, cold sores, or herpes) -kidney disease -liver disease -mental illness -myasthenia gravis -osteoporosis -seizures -stomach or intestine problems -thyroid disease -an unusual or allergic reaction to lactose, prednisone, other medicines, foods, dyes, or preservatives -pregnant or trying to get pregnant -breast-feeding How should I use this medicine? Take this medicine by mouth with a glass of water. Follow the directions on the prescription label. Take this medicine with food. If you are taking this medicine once a day, take it in the morning. Do not take more medicine than you are told to take. Do not suddenly stop taking your medicine because you may develop a severe reaction. Your doctor will tell you how much medicine to take. If your doctor wants you to stop the medicine, the dose may be slowly lowered over time to avoid any side effects. Talk to your pediatrician regarding the use of this medicine in children. Special care may be needed. Overdosage: If you think you have taken too much of this  medicine contact a poison control center or emergency room at once. NOTE: This medicine is only for you. Do not share this medicine with others. What if I miss a dose? If you miss a dose, take it as soon as you can. If it is almost time for your next dose, talk to your doctor or health care professional. You may need to miss a dose or take an extra dose. Do not take double or extra doses without advice. What may interact with this medicine? Do not take this medicine with any of the following medications: -metyrapone -mifepristone This medicine may also interact with the following medications: -aminoglutethimide -amphotericin B -aspirin and aspirin-like medicines -barbiturates -certain medicines for diabetes, like glipizide or glyburide -cholestyramine -cholinesterase inhibitors -cyclosporine -digoxin -diuretics -ephedrine -female hormones, like estrogens and birth control pills -isoniazid -ketoconazole -NSAIDS, medicines for pain and inflammation, like ibuprofen or naproxen -phenytoin -rifampin -toxoids -vaccines -warfarin This list may not describe all possible interactions. Give your health care provider a list of all the medicines, herbs, non-prescription drugs, or dietary supplements you use. Also tell them if you smoke, drink alcohol, or use illegal drugs. Some items may interact with your medicine. What should I watch for while using this medicine? Visit your doctor or health care professional for regular checks on your progress. If you are taking this medicine over a prolonged period, carry an identification card with your name and address, the type and dose of your medicine, and your doctor's name and address. This medicine may increase your risk of getting an infection. Tell your doctor or health care professional if you are around anyone with measles or chickenpox, or if you develop sores  or blisters that do not heal properly. If you are going to have surgery, tell your  doctor or health care professional that you have taken this medicine within the last twelve months. Ask your doctor or health care professional about your diet. You may need to lower the amount of salt you eat. This medicine may affect blood sugar levels. If you have diabetes, check with your doctor or health care professional before you change your diet or the dose of your diabetic medicine. What side effects may I notice from receiving this medicine? Side effects that you should report to your doctor or health care professional as soon as possible: -allergic reactions like skin rash, itching or hives, swelling of the face, lips, or tongue -changes in emotions or moods -changes in vision -depressed mood -eye pain -fever or chills, cough, sore throat, pain or difficulty passing urine -increased thirst -swelling of ankles, feet Side effects that usually do not require medical attention (report to your doctor or health care professional if they continue or are bothersome): -confusion, excitement, restlessness -headache -nausea, vomiting -skin problems, acne, thin and shiny skin -trouble sleeping -weight gain This list may not describe all possible side effects. Call your doctor for medical advice about side effects. You may report side effects to FDA at 1-800-FDA-1088. Where should I keep my medicine? Keep out of the reach of children. Store at room temperature between 15 and 30 degrees C (59 and 86 degrees F). Protect from light. Keep container tightly closed. Throw away any unused medicine after the expiration date. NOTE: This sheet is a summary. It may not cover all possible information. If you have questions about this medicine, talk to your doctor, pharmacist, or health care provider.  2018 Elsevier/Gold Standard (2011-01-02 10:57:14)

## 2017-07-02 NOTE — Telephone Encounter (Signed)
Patient called to report that she needs to be seen. She has Mantle Cell Lymphoma and for 3 days has swelling of her face, neck and arms that is getting worse. She contacted her PCP who advised she call us.  She does not return until March for her next Rituxan treatment. Her last treatment was 06/19/17. I gave her an appointment for this afternoon at [facility] lab, 230 NP Please advise on which labs you would like to check

## 2017-07-02 NOTE — Telephone Encounter (Signed)
Per Lauren, no labs at this time

## 2017-07-02 NOTE — Telephone Encounter (Signed)
Pt reports "mild" swelling of sides of neck" some facial swelling ,also "mild" ( 2/10) onset Saturday 06/27/17. This am noted both arms swelling "from wrists to elbows." Some tenderness to touch neck only. States does note some "swelling and tenderness" bilateral axilla as well. Denies itching, redness, fever. Reports speech in am "broken up" states not slurred, clear and fluent during call.  Denies swallowing difficulty, SOB,no one sided weakness. Also reports mild dizziness in am.  States resolves "once up and about."  . Pt started on Hydroxychloroquine 1 week ago. Pt has h/o lymphoma, RA. Instructed pt to make oncologist aware of new symptoms. Pt states she will call now. Care advice given per protocol, instructed to call back if symptoms worsen. Reason for Disposition . Swelling is painful to touch  Answer Assessment - Initial Assessment Questions 1. ONSET: "When did the swelling start?" (e.g., minutes, hours, days)     Saturday, 06/27/17 2. LOCATION: "What part of the face is swollen?"     Face, neck, now arms this AM. 3. SEVERITY: "How swollen is it?"     Mild, "noticeable" 4. ITCHING: "Is there any itching?" If so, ask: "How much?"   (Scale 1-10; mild, moderate or severe)     no 5. PAIN: "Is the swelling painful to touch?" If so, ask: "How painful is it?"   (Scale 1-10; mild, moderate or severe)     "Sore on side of neck." 6. FEVER: "Do you have a fever?" If so, ask: "What is it, how was it measured, and when did it start?"      no 7. CAUSE: "What do you think is causing the face swelling?"     Unsure, new medication Hydroxychloroquine 8. RECURRENT SYMPTOM: "Have you had face swelling before?" If so, ask: "When was the last time?" "What happened that time?"     No 9. OTHER SYMPTOMS: "Do you have any other symptoms?" (e.g., toothache, leg swelling)    Both arms swollen from wrist to elbow, speech "Broken up in mornings,not slurred."  Protocols used: [employer]

## 2017-07-02 NOTE — Progress Notes (Signed)
Patient report tender to touch to bilateral tempers, and bilateral fore arm.The patient also report she has been having chest pains before coming into visit and increase swollen to face and neck x 5 days. Patient also report having dizziness first thing in the morning.

## 2017-07-13 ENCOUNTER — Ambulatory Visit: Payer: Self-pay | Admitting: Family

## 2017-07-17 ENCOUNTER — Encounter: Payer: Self-pay | Admitting: Nurse Practitioner

## 2017-08-19 ENCOUNTER — Ambulatory Visit: Payer: Self-pay | Admitting: Internal Medicine

## 2017-08-19 ENCOUNTER — Ambulatory Visit: Payer: Self-pay

## 2017-08-19 ENCOUNTER — Other Ambulatory Visit: Payer: Self-pay

## 2017-08-21 ENCOUNTER — Other Ambulatory Visit: Payer: Self-pay

## 2017-08-21 ENCOUNTER — Ambulatory Visit: Payer: Self-pay

## 2017-08-21 ENCOUNTER — Ambulatory Visit: Payer: Self-pay | Admitting: Internal Medicine

## 2017-08-28 ENCOUNTER — Inpatient Hospital Stay (HOSPITAL_BASED_OUTPATIENT_CLINIC_OR_DEPARTMENT_OTHER): Payer: Medicare HMO | Admitting: Internal Medicine

## 2017-08-28 ENCOUNTER — Other Ambulatory Visit: Payer: Self-pay

## 2017-08-28 ENCOUNTER — Inpatient Hospital Stay: Payer: Medicare HMO

## 2017-08-28 ENCOUNTER — Inpatient Hospital Stay: Payer: Medicare HMO | Attending: Internal Medicine

## 2017-08-28 VITALS — BP 124/68 | HR 66 | Temp 97.7°F | Resp 20 | Ht 66.0 in | Wt 250.0 lb

## 2017-08-28 DIAGNOSIS — R05 Cough: Secondary | ICD-10-CM | POA: Diagnosis not present

## 2017-08-28 DIAGNOSIS — Z5112 Encounter for antineoplastic immunotherapy: Secondary | ICD-10-CM | POA: Diagnosis not present

## 2017-08-28 DIAGNOSIS — R0982 Postnasal drip: Secondary | ICD-10-CM | POA: Diagnosis not present

## 2017-08-28 DIAGNOSIS — C8311 Mantle cell lymphoma, lymph nodes of head, face, and neck: Secondary | ICD-10-CM

## 2017-08-28 LAB — COMPREHENSIVE METABOLIC PANEL
ALT: 15 U/L (ref 14–54)
AST: 23 U/L (ref 15–41)
Albumin: 3.6 g/dL (ref 3.5–5.0)
Alkaline Phosphatase: 69 U/L (ref 38–126)
Anion gap: 9 (ref 5–15)
BUN: 15 mg/dL (ref 6–20)
CO2: 25 mmol/L (ref 22–32)
Calcium: 9.1 mg/dL (ref 8.9–10.3)
Chloride: 106 mmol/L (ref 101–111)
Creatinine, Ser: 0.76 mg/dL (ref 0.44–1.00)
GFR calc Af Amer: >60 mL/min (ref >60)
GFR calc non Af Amer: >60 mL/min (ref >60)
Glucose, Bld: 99 mg/dL (ref 65–99)
Potassium: 3.9 mmol/L (ref 3.5–5.1)
Sodium: 140 mmol/L (ref 135–145)
Total Bilirubin: 0.4 mg/dL (ref 0.3–1.2)
Total Protein: 7 g/dL (ref 6.5–8.1)

## 2017-08-28 LAB — CBC WITH DIFFERENTIAL/PLATELET
Basophils Absolute: 0 10*3/uL (ref 0–0.1)
Basophils Relative: 0 %
Eosinophils Absolute: 0.2 10*3/uL (ref 0–0.7)
Eosinophils Relative: 2 %
HCT: 39.5 % (ref 35.0–47.0)
Hemoglobin: 13.3 g/dL (ref 12.0–16.0)
Lymphocytes Relative: 18 %
Lymphs Abs: 1.3 10*3/uL (ref 1.0–3.6)
MCH: 30.1 pg (ref 26.0–34.0)
MCHC: 33.5 g/dL (ref 32.0–36.0)
MCV: 89.7 fL (ref 80.0–100.0)
Monocytes Absolute: 0.4 10*3/uL (ref 0.2–0.9)
Monocytes Relative: 5 %
Neutro Abs: 5.3 10*3/uL (ref 1.4–6.5)
Neutrophils Relative %: 75 %
Platelets: 198 10*3/uL (ref 150–440)
RBC: 4.4 MIL/uL (ref 3.80–5.20)
RDW: 15.5 % — ABNORMAL HIGH (ref 11.5–14.5)
WBC: 7.1 10*3/uL (ref 3.6–11.0)

## 2017-08-28 LAB — LACTATE DEHYDROGENASE: LDH: 200 U/L — ABNORMAL HIGH (ref 98–192)

## 2017-08-28 MED ORDER — DIPHENHYDRAMINE HCL 25 MG PO CAPS
50.0000 mg | ORAL_CAPSULE | Freq: Once | ORAL | Status: AC
  Administered 2017-08-28: 50 mg via ORAL
  Filled 2017-08-28: qty 2

## 2017-08-28 MED ORDER — SODIUM CHLORIDE 0.9 % IV SOLN
375.0000 mg/m2 | Freq: Once | INTRAVENOUS | Status: AC
  Administered 2017-08-28: 800 mg via INTRAVENOUS
  Filled 2017-08-28: qty 30

## 2017-08-28 MED ORDER — SODIUM CHLORIDE 0.9 % IV SOLN: 375.0000 mg/m2 | Freq: Once | INTRAVENOUS | Status: DC

## 2017-08-28 MED ORDER — ACETAMINOPHEN 325 MG PO TABS
650.0000 mg | ORAL_TABLET | Freq: Once | ORAL | Status: AC
  Administered 2017-08-28: 650 mg via ORAL
  Filled 2017-08-28: qty 2

## 2017-08-28 MED ORDER — SODIUM CHLORIDE 0.9 % IV SOLN
Freq: Once | INTRAVENOUS | Status: AC
  Administered 2017-08-28: 11:00:00 via INTRAVENOUS
  Filled 2017-08-28: qty 1000

## 2017-08-28 MED ORDER — HEPARIN SOD (PORK) LOCK FLUSH 100 UNIT/ML IV SOLN
500.0000 [IU] | Freq: Once | INTRAVENOUS | Status: AC | PRN
  Administered 2017-08-28: 500 [IU]
  Filled 2017-08-28: qty 5

## 2017-08-28 NOTE — Progress Notes (Signed)
Patient reports intermittent cough and clear nasal congestion. She is using the flonase nasal spray and mucinex to treat cold like symptoms.

## 2017-08-28 NOTE — Progress Notes (Signed)
[facility] NOTE  Patient Care Team: Burnard Hawthorne, FNP as PCP - General (Family Medicine) Jackolyn Confer, MD (Internal Medicine)  CHIEF COMPLAINTS/PURPOSE OF CONSULTATION: MANTLE CELL LYMPHOMA  Oncology History   # JAN 2017- MANTLE CELL LYMPHOMA STAGE IV; [R Breast LN [place] Core Bx-1.2cm LN/R Ax LN-Bx]; cyclin D Pos; Mitotic rate-LOW; MIPI score [5/intermediate risk]; BMBx-Positive for involvement. Feb 9th- START Benda-Ritux with neulasta; Prolonged neutropenia; DISCONT- Benda-Ritux;   # April 13 th 2017- START R-CHOP x1; severe/prolonged neutropenia; PET- CR; BMBx-Neg; Disc R-CHOP  # 26th MAY 2017- Start Rituxan q 78M Main OCT 12th 2017- PET NED.   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   Patient had episode of facial swelling neck swelling approximately 2 weeks after the last rituximab infusion.  Patient complains of mild cough; nasal drip over the last few days.  She is not on antihistamines.  Denies any new lumps or bumps.  Appetite is good.  No fever chills no weight loss.  Chronic bilateral arthritic pain not any worse.    ROS: A complete 10 point review of system is done which is negative except mentioned above in history of present illness  MEDICAL HISTORY:  Past Medical History:  Diagnosis Date  . Arthritis   . Collagen vascular disease ([place])   . GERD (gastroesophageal reflux disease)   . Headache(784.0)   . History of methotrexate therapy   . Hyperlipidemia    hx  . Lymphadenopathy of head and neck 01/2015   see on Thyroid ultrasound  . Lymphoma, mantle cell ([place]) 06/01/2015   bx of lymph node in right breast/Stage IV Mantle Cell Lymphoma   . Rheumatoid arthritis ([place])     SURGICAL HISTORY: Past Surgical History:  Procedure Laterality Date  . CARDIAC CATHETERIZATION  09/2004   [facility]; EF 60%  . CARDIAC CATHETERIZATION  08/2004   [facility]  . PERIPHERAL VASCULAR CATHETERIZATION N/A 07/04/2015   Procedure: [name] Insertion;  Surgeon: [name], MD;  Location: [facility];  Service: Cardiovascular;  Laterality: N/A;    SOCIAL HISTORY: Social History   Socioeconomic History  . Marital status: Married    Spouse name: Not on file  . Number of children: Not on file  . Years of education: Not on file  . Highest education level: Not on file  Occupational History  . Not on file  Social Needs  . Financial resource strain: Not on file  . Food insecurity:    Worry: Not on file    Inability: Not on file  . Transportation needs:    Medical: Not on file    Non-medical: Not on file  Tobacco Use  . Smoking status: Never Smoker  . Smokeless tobacco: Never Used  Substance and Sexual Activity  . Alcohol use: No  . Drug use: No  . Sexual activity: Never  Lifestyle  . Physical activity:    Days per week: Not on file    Minutes per session: Not on file  . Stress: Not on file  Relationships  . Social connections:    Talks on phone: Not on file    Gets  together: Not on file    Attends religious service: Not on file    Active member of club or organization: Not on file    Attends meetings of clubs or organizations: Not on file    Relationship status: Not on file  . Intimate partner violence:    Fear of current or ex partner: Not on file    Emotionally abused: Not on file    Physically abused: Not on file    Forced sexual activity: Not on file  Other Topics Concern  . 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    FAMILY HISTORY: Family History  Problem Relation Age of Onset  . Diabetes Mother   . Cholelithiasis Mother   .  Hypertension Sister   . Diabetes Sister   . Arthritis Brother   . Breast cancer Neg Hx     ALLERGIES:  has No Known Allergies.  MEDICATIONS:  Current Outpatient Medications  Medication Sig Dispense Refill  . acetaminophen (TYLENOL) 500 MG tablet Take 500 mg by mouth every 6 (six) hours as needed.    [name] albuterol (PROVENTIL HFA;VENTOLIN HFA) 108 (90 Base) MCG/ACT inhaler Inhale 2 puffs into the lungs every 6 (six) hours as needed for wheezing or shortness of breath. 1 Inhaler 0  . Calcium Carbonate-Vit D-Min (CALTRATE 600+D PLUS) 600-800 MG-UNIT CHEW Chew 1 tablet by mouth 2 (two) times daily. 60 tablet 6  . cyanocobalamin 500 MCG tablet Take 500 mcg by mouth daily. Take two gummies daily    . fluticasone (FLONASE) 50 MCG/ACT nasal spray SPRAY TWICE IN EACH NOSTRIL ONCE DAILY  4  . guaiFENesin (MUCINEX) 600 MG 12 hr tablet Take 600 mg by mouth 2 (two) times daily.    [name] lidocaine-prilocaine (EMLA) cream Apply 1 application topically as needed. 30 g 11  . Multiple Vitamins-Minerals (MULTIVITAMIN GUMMIES ADULT) CHEW Chew by mouth. Reported on 09/06/2015    . Omega-3 1000 MG CAPS Take by mouth.    [name] omeprazole (PRILOSEC) 40 MG capsule TAKE 1 CAPSULE BY MOUTH ONCE A DAY 90 capsule 1  . pravastatin (PRAVACHOL) 40 MG tablet Take 1 tablet (40 mg total) by mouth daily. 90 tablet 2  . hydroxychloroquine (PLAQUENIL) 200 MG tablet Take 2 tablets (400 mg total) by mouth daily. Follow-up appt needed. Please call office for appt. 180 tablet 0  . meclizine (ANTIVERT) 25 MG tablet Take 25 mg by mouth 3 (three) times daily as needed for dizziness.    . ondansetron (ZOFRAN) 8 MG tablet Take 1 tablet (8 mg total) by mouth every 8 (eight) hours as needed for nausea or vomiting (start 3 days; after chemo). (Patient not taking: Reported on 08/28/2017) 40 tablet 0  . PREVIDENT 0.2 % SOLN RINSE AFTER THOROUGH BRUSHING  10  . prochlorperazine (COMPAZINE) 10 MG tablet Take 1 tablet (10 mg total) by mouth every 6 (six)  hours as needed for nausea or vomiting. (Patient not taking: Reported on 08/28/2017) 30 tablet 0   No current facility-administered medications for this visit.    Facility-Administered Medications Ordered in Other Visits  Medication Dose Route Frequency Provider Last Rate Last Dose  . Tbo-Filgrastim (GRANIX) injection 480 mcg  480 mcg Subcutaneous Once Cammie Sickle, MD          .  PHYSICAL EXAMINATION: ECOG PERFORMANCE STATUS: 0 - Asymptomatic  Vitals:   08/28/17 0945  BP:  124/68  Pulse: 66  Resp: 20  Temp: 97.7 F (36.5 C)   Filed Weights   08/28/17 0941  Weight: 250 lb (113.4 kg)    GENERAL: Well-nourished well-developed; Alert, no distress and comfortable. Accompanied by her sister EYES: no pallor or icterus OROPHARYNX: no thrush or ulceration;  NECK: supple, no masses felt LYMPH:  lymphadenopathy in the cervical, axillary/inguinal lymph nodes resolved. LUNGS: clear to auscultation and No wheeze or crackles HEART/CVS: regular rate & rhythm and no murmurs; No lower extremity edema ABDOMEN: abdomen soft, non-tender and normal bowel sounds; no hepato-splenomegaly.  Musculoskeletal:no cyanosis of digits and no clubbing;  PSYCH: alert & oriented x 3 with fluent speech NEURO: no focal motor/sensory deficits SKIN: no rashes or significant lesions  LABORATORY DATA:  I have reviewed the data as listed Lab Results  Component Value Date   WBC 7.1 08/28/2017   HGB 13.3 08/28/2017   HCT 39.5 08/28/2017   MCV 89.7 08/28/2017   PLT 198 08/28/2017   Recent Labs    04/17/17 0952 06/19/17 1034 08/28/17 0931  NA 138 138 140  K 3.7 3.8 3.9  CL 105 104 106  CO2 24 25 25   GLUCOSE 112* 128* 99  BUN 12 14 15   CREATININE 0.75 0.74 0.76  CALCIUM 8.7* 9.0 9.1  GFRNONAA >60 >60 >60  GFRAA >60 >60 >60  PROT 6.9 6.9 7.0  ALBUMIN 3.9 3.7 3.6  AST 23 24 23   ALT 12* 12* 15  ALKPHOS 73 70 69  BILITOT 0.7 0.7 0.4   ASSESSMENT & PLAN:   Mantle cell lymphoma of  lymph nodes of head, face, and neck ([place]) # MANTLE CELL LYMPHOMA- STAGE IV-extremely poor tolerance to chemotherapy; Currently on maintenance Rituxan every 2 months since May 2017; NOv 11th 2018-CT NED [no evidence of any recurrence or any progression of disease.]   # Clinically doing well; no concerns of progression. Continue with for a total of 2 years [?  Stop May 2019].  However given patient's poor tolerance to induction therapy-longer duration of Rituxan maintenance could be entertained.  Will make further decisions based upon a follow-up imaging which could be ordered at next visit.  #Episode of facial swelling/neck swelling-approximately 2 weeks post Rituxan infusion; unclear etiology less likely secondary to Rituxan.  Recommend Benadryl on hand/as needed.  # URI/allrgies-; do not suspect bronchitis or lower respiratory infection.  Recommend clairtin- cough not better; do not suspect to let us know.  # Chronic arthritis- okay with NSAIDs as needed.   # follow up in 2 months/labs/LDH.  Will order imaging at that time.  Cc:Dr.Cook.     Cammie Sickle, MD 08/29/2017 11:30 AM

## 2017-08-28 NOTE — Assessment & Plan Note (Addendum)
#   MANTLE CELL LYMPHOMA- STAGE IV-extremely poor tolerance to chemotherapy; Currently on maintenance Rituxan every 2 months since May 2017; NOv 11th 2018-CT NED [no evidence of any recurrence or any progression of disease.]   # Clinically doing well; no concerns of progression. Continue with for a total of 2 years [?  Stop May 2019].  However given patient's poor tolerance to induction therapy-longer duration of Rituxan maintenance could be entertained.  Will make further decisions based upon a follow-up imaging which could be ordered at next visit.  #Episode of facial swelling/neck swelling-approximately 2 weeks post Rituxan infusion; unclear etiology less likely secondary to Rituxan.  Recommend Benadryl on hand/as needed.  # URI/allrgies-; do not suspect bronchitis or lower respiratory infection.  Recommend clairtin- cough not better; do not suspect to let us know.  # Chronic arthritis- okay with NSAIDs as needed.   # follow up in 2 months/labs/LDH.  Will order imaging at that time.  Cc:Dr.Cook.

## 2017-08-28 NOTE — Patient Instructions (Signed)
Take claritin [over the counter]1 pill a day x 10 days.

## 2017-09-04 DIAGNOSIS — H2513 Age-related nuclear cataract, bilateral: Secondary | ICD-10-CM | POA: Diagnosis not present

## 2017-09-04 DIAGNOSIS — Z79899 Other long term (current) drug therapy: Secondary | ICD-10-CM | POA: Diagnosis not present

## 2017-09-07 ENCOUNTER — Other Ambulatory Visit: Payer: Self-pay | Admitting: Family

## 2017-09-07 DIAGNOSIS — M069 Rheumatoid arthritis, unspecified: Secondary | ICD-10-CM

## 2017-09-08 NOTE — Telephone Encounter (Signed)
Last filled 06/15/17 Last refill stated need appointment  Left voicemail to call to schedule office visit No office visit scheduled

## 2017-09-09 NOTE — Telephone Encounter (Signed)
Call pt  I do not feel comfortable in continuing to refill the Plaquenil.    She is on this medication for rheumatoid arthritis,  in the past she been seeing Dr. Jefm Bryant rheumatologist at [facility].    Please advise we can place a referral again to she may see him.    Otherwise, patient needs to have a follow-up appoint with [place].

## 2017-09-17 NOTE — Telephone Encounter (Signed)
Spoke with patient , she states her rheumatologist has retired , she needs to be referred to new rheumatologist .

## 2017-09-21 ENCOUNTER — Other Ambulatory Visit: Payer: Self-pay | Admitting: Family

## 2017-09-21 DIAGNOSIS — M0579 Rheumatoid arthritis with rheumatoid factor of multiple sites without organ or systems involvement: Secondary | ICD-10-CM

## 2017-09-21 NOTE — Telephone Encounter (Signed)
Call pt  I placed ref to new rheumatologist Please call us in the next week if no appointment.  I refilled plaquenil for 3 months supply

## 2017-09-22 NOTE — Telephone Encounter (Signed)
Patient advised of below  

## 2017-09-25 ENCOUNTER — Encounter: Payer: Self-pay | Admitting: Family

## 2017-09-25 ENCOUNTER — Ambulatory Visit (INDEPENDENT_AMBULATORY_CARE_PROVIDER_SITE_OTHER): Payer: Medicare HMO | Admitting: Family

## 2017-09-25 VITALS — BP 120/70 | HR 66 | Temp 98.2°F | Resp 15 | Wt 253.0 lb

## 2017-09-25 DIAGNOSIS — T7840XD Allergy, unspecified, subsequent encounter: Secondary | ICD-10-CM | POA: Diagnosis not present

## 2017-09-25 DIAGNOSIS — E785 Hyperlipidemia, unspecified: Secondary | ICD-10-CM

## 2017-09-25 DIAGNOSIS — K219 Gastro-esophageal reflux disease without esophagitis: Secondary | ICD-10-CM

## 2017-09-25 DIAGNOSIS — M069 Rheumatoid arthritis, unspecified: Secondary | ICD-10-CM

## 2017-09-25 DIAGNOSIS — Z13820 Encounter for screening for osteoporosis: Secondary | ICD-10-CM

## 2017-09-25 DIAGNOSIS — T7840XA Allergy, unspecified, initial encounter: Secondary | ICD-10-CM | POA: Insufficient documentation

## 2017-09-25 MED ORDER — EPINEPHRINE 0.3 MG/0.3ML IJ SOAJ: 0.3000 mg | Freq: Once | INTRAMUSCULAR | 1 refills | Status: AC

## 2017-09-25 NOTE — Assessment & Plan Note (Signed)
One episode. No reoccurrence thus far. Unsure etiology. Given epi pen today and advised to keep benadryl on hand.

## 2017-09-25 NOTE — Patient Instructions (Addendum)
Cause of allergic reaction unknown. Please stay vigilant. I have prescribed an epi pen if you were to need. Please also keep benadryl on hand  Start pravachol once a week for a couple of weeks, and then increase to twice per week. From there move to daily as prescribed as long as no side effects.   Today we discussed referrals, orders. Bone Density   I have placed these orders in the system for you.  Please be sure to give [place] a call if you have not heard from our office regarding scheduling a test or regarding referral in a timely manner.  It is very important that you let me know as soon as possible.    Long term use beyond 3 months of proton pump inhibitors , also called PPI's, is associated with malabsorption of vitamins, chronic kidney disease, fracture risk, and diarrheal illnesses. PPI's include Nexium, Prilosec, Protonix, Dexilant, and Prevacid.   I generally recommend trying to control acid reflux with lifestyle modifications including avoiding trigger foods, not eating 2 hours prior to bedtime. You may use histamine 2 blockers daily to twice daily ( this is Zantac, Pepcid) and then when symptoms flare, start back on PPI for short course.   Of note, we will need to do an endoscopy ( upper GI) to evaluate your esophagus, stomach in the future if acid reflux persists are you develop red flag symptoms: trouble swallowing, hoarseness, chronic cough, unexplained weight loss.

## 2017-09-25 NOTE — Progress Notes (Signed)
Subjective:    Patient ID: Michelle [name], female    DOB: 1940/10/15, 77 y.o.   MRN: [id number]  CC: Michelle [name] is a 77 y.o. female who presents today for follow up.   HPI: Doing well today  Accompanied by daughter.   RA- hasnt been on plaquneil ; seeing Dr Annamaria Boots  in Oct 08 2017.   HLD- not on pravastatin ; tried it a couple of couple of months ago and had a cough. No sob, rash. Not sure if related to medication.   Reports an episode of neck and face swelling a month ago. No known etiology.  Endorses trouble swallowing at that time. Had and thought.  Not on ACE/ARB. Saw her oncologist at that time.    GERD-asymptomatic. has ran out of omeprazole. Candis Musa if she needs refill.  No dyspepsia, hoarseness, voice changes. Never smoker.   Seen 07/2017 onc- Dr Rogue Bussing- after rituximab infusion.   Colonoscopy 6 years ago. Reports an EGD in the past- I dont see in chart.  HISTORY:  Past Medical History:  Diagnosis Date  . Arthritis   . Collagen vascular disease ([place])   . GERD (gastroesophageal reflux disease)   . Headache(784.0)   . History of methotrexate therapy   . Hyperlipidemia    hx  . Lymphadenopathy of head and neck 01/2015   see on Thyroid ultrasound  . 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 Rheumatoid arthritis [facility])    Past Surgical History:  Procedure Laterality Date  . CARDIAC CATHETERIZATION  09/2004   [facility]; EF 60%  . CARDIAC CATHETERIZATION  08/2004   [facility]  . PERIPHERAL VASCULAR CATHETERIZATION N/A 07/04/2015   Procedure: [name] Insertion;  Surgeon: [name], MD;  Location: [place] [facility];  Service: Cardiovascular;  Laterality: N/A;   Family History  Problem Relation Age of Onset  . Diabetes Mother   . Cholelithiasis Mother   . Hypertension Sister   . Diabetes Sister   . Arthritis Brother   . Breast cancer Neg Hx     Allergies: Patient has no known  allergies. Current Outpatient Medications on File Prior to Visit  Medication Sig Dispense Refill  . acetaminophen (TYLENOL) 500 MG tablet Take 500 mg by mouth every 6 (six) hours as needed.    [name] albuterol (PROVENTIL HFA;VENTOLIN HFA) 108 (90 Base) MCG/ACT inhaler Inhale 2 puffs into the lungs every 6 (six) hours as needed for wheezing or shortness of breath. 1 Inhaler 0  . Calcium Carbonate-Vit D-Min (CALTRATE 600+D PLUS) 600-800 MG-UNIT CHEW Chew 1 tablet by mouth 2 (two) times daily. 60 tablet 6  . cyanocobalamin 500 MCG tablet Take 500 mcg by mouth daily. Take two gummies daily    . fluticasone (FLONASE) 50 MCG/ACT nasal spray SPRAY TWICE IN EACH NOSTRIL ONCE DAILY  4  . guaiFENesin (MUCINEX) 600 MG 12 hr tablet Take 600 mg by mouth 2 (two) times daily.    . hydroxychloroquine (PLAQUENIL) 200 MG tablet TAKE 2 TABLETS (400 MG TOTAL) BY MOUTH DAILY. FOLLOW-UP APPT NEEDED. PLEASE CALL OFFICE FOR APPT. 180 tablet 0  . lidocaine-prilocaine (EMLA) cream Apply 1 application topically as needed. 30 g 11  . meclizine (ANTIVERT) 25 MG tablet Take 25 mg by mouth 3 (three) times daily as needed for dizziness.    . Multiple Vitamins-Minerals (MULTIVITAMIN GUMMIES ADULT) CHEW Chew by mouth. Reported on 09/06/2015    . Omega-3  1000 MG CAPS Take by mouth.    . ondansetron (ZOFRAN) 8 MG tablet Take 1 tablet (8 mg total) by mouth every 8 (eight) hours as needed for nausea or vomiting (start 3 days; after chemo). 40 tablet 0  . PREVIDENT 0.2 % SOLN RINSE AFTER THOROUGH BRUSHING  10  . prochlorperazine (COMPAZINE) 10 MG tablet Take 1 tablet (10 mg total) by mouth every 6 (six) hours as needed for nausea or vomiting. 30 tablet 0   Current Facility-Administered Medications on File Prior to Visit  Medication Dose Route Frequency Provider Last Rate Last Dose  . Tbo-Filgrastim (GRANIX) injection 480 mcg  480 mcg Subcutaneous Once Cammie Sickle, MD        Social History   Tobacco Use  . Smoking status:  Never Smoker  . Smokeless tobacco: Never Used  Substance Use Topics  . Alcohol use: No  . Drug use: No    Review of Systems  Constitutional: Negative for chills and fever.  HENT: Negative for trouble swallowing (resolved) and voice change.   Respiratory: Negative for cough, shortness of breath and wheezing.   Cardiovascular: Negative for chest pain and palpitations.  Gastrointestinal: Negative for nausea and vomiting.  Skin: Negative for rash.      Objective:    BP 120/70 (BP Location: Left Arm, Patient Position: Sitting, Cuff Size: Large)   Pulse 66   Temp 98.2 F (36.8 C) (Oral)   Resp 15   Wt 253 lb (114.8 kg)   SpO2 96%   BMI 40.84 kg/m  BP Readings from Last 3 Encounters:  09/25/17 120/70  08/28/17 124/68  07/02/17 107/61   Wt Readings from Last 3 Encounters:  09/25/17 253 lb (114.8 kg)  08/28/17 250 lb (113.4 kg)  07/02/17 250 lb (113.4 kg)    Physical Exam  Constitutional: She appears well-developed and well-nourished.  Eyes: Conjunctivae are normal.  Cardiovascular: Normal rate, regular rhythm, normal heart sounds and normal pulses.  Pulmonary/Chest: Effort normal and breath sounds normal. She has no wheezes. She has no rhonchi. She has no rales.  Neurological: She is alert.  Skin: Skin is warm and dry.  Psychiatric: She has a normal mood and affect. Her speech is normal and behavior is normal. Thought content normal.  Vitals reviewed.      Assessment & Plan:   Problem List Items Addressed This Visit      Digestive   GERD (gastroesophageal reflux disease)    No symptoms at this time.  Discontinued PPI patient symptoms clinically resolved.  Education provided on long-term use of PPIs.        Musculoskeletal and Integument   Rheumatoid arthritis ([place])    Pending establishment with Dr. Jefm Bryant, rheumatologist. Will follow        Other   Screening for osteoporosis    dexa ordered       Relevant Orders   DG Bone Density   HLD  (hyperlipidemia) - Primary    Discussed cardiovascular risk, modification to try again the pravastatin.  Patient will try once per week and slowly titrate from there.  Will follow.  Note, at follow-up will repeat lipid panel.      Allergic reaction    One episode. No reoccurrence thus far. Unsure etiology. Given epi pen today and advised to keep benadryl on hand.           I have discontinued [place]. Riggan's pravastatin and omeprazole. I am also having her start on EPINEPHrine. Additionally, I am  having her maintain her CALTRATE 600+D PLUS MINERALS, MULTIVITAMIN GUMMIES ADULT, lidocaine-prilocaine, prochlorperazine, ondansetron, fluticasone, vitamin B-12, acetaminophen, PREVIDENT, meclizine, Omega-3, albuterol, guaiFENesin, and hydroxychloroquine.   Meds ordered this encounter  Medications  . EPINEPHrine 0.3 mg/0.3 mL IJ SOAJ injection    Sig: Inject 0.3 mLs (0.3 mg total) into the muscle once for 1 dose.    Dispense:  1 Device    Refill:  1    Order Specific Question:   Supervising Provider    Answer:   Crecencio Mc [[id number]]    Return precautions given.   Risks, benefits, and alternatives of the medications and treatment plan prescribed today were discussed, and patient expressed understanding.   Education regarding symptom management and diagnosis given to patient on AVS.  Continue to follow with Burnard Hawthorne, FNP for routine health maintenance.   [name] and I agreed with plan.   Mable Paris, FNP

## 2017-09-25 NOTE — Assessment & Plan Note (Signed)
dexa ordered

## 2017-09-26 NOTE — Assessment & Plan Note (Addendum)
Pending establishment with Dr. Jefm Bryant, rheumatologist. Will follow

## 2017-09-26 NOTE — Assessment & Plan Note (Signed)
Discussed cardiovascular risk, modification to try again the pravastatin.  Patient will try once per week and slowly titrate from there.  Will follow.  Note, at follow-up will repeat lipid panel.

## 2017-09-26 NOTE — Assessment & Plan Note (Signed)
No symptoms at this time.  Discontinued PPI patient symptoms clinically resolved.  Education provided on long-term use of PPIs.

## 2017-10-08 DIAGNOSIS — C8318 Mantle cell lymphoma, lymph nodes of multiple sites: Secondary | ICD-10-CM | POA: Diagnosis not present

## 2017-10-08 DIAGNOSIS — M0579 Rheumatoid arthritis with rheumatoid factor of multiple sites without organ or systems involvement: Secondary | ICD-10-CM | POA: Diagnosis not present

## 2017-10-08 DIAGNOSIS — M25562 Pain in left knee: Secondary | ICD-10-CM | POA: Diagnosis not present

## 2017-10-08 DIAGNOSIS — G8929 Other chronic pain: Secondary | ICD-10-CM | POA: Diagnosis not present

## 2017-10-09 DIAGNOSIS — Z79899 Other long term (current) drug therapy: Secondary | ICD-10-CM | POA: Diagnosis not present

## 2017-10-09 DIAGNOSIS — H25013 Cortical age-related cataract, bilateral: Secondary | ICD-10-CM | POA: Diagnosis not present

## 2017-10-09 DIAGNOSIS — H2513 Age-related nuclear cataract, bilateral: Secondary | ICD-10-CM | POA: Diagnosis not present

## 2017-10-09 DIAGNOSIS — H35033 Hypertensive retinopathy, bilateral: Secondary | ICD-10-CM | POA: Diagnosis not present

## 2017-10-09 DIAGNOSIS — H1851 Endothelial corneal dystrophy: Secondary | ICD-10-CM | POA: Diagnosis not present

## 2017-10-09 DIAGNOSIS — H2512 Age-related nuclear cataract, left eye: Secondary | ICD-10-CM | POA: Diagnosis not present

## 2017-10-20 DIAGNOSIS — H25812 Combined forms of age-related cataract, left eye: Secondary | ICD-10-CM | POA: Diagnosis not present

## 2017-10-20 DIAGNOSIS — H2512 Age-related nuclear cataract, left eye: Secondary | ICD-10-CM | POA: Diagnosis not present

## 2017-10-27 DIAGNOSIS — H2512 Age-related nuclear cataract, left eye: Secondary | ICD-10-CM | POA: Diagnosis not present

## 2017-10-30 ENCOUNTER — Inpatient Hospital Stay: Payer: Medicare HMO

## 2017-10-30 ENCOUNTER — Inpatient Hospital Stay: Payer: Medicare HMO | Attending: Internal Medicine | Admitting: Internal Medicine

## 2017-10-30 VITALS — BP 114/71 | HR 62 | Temp 97.7°F | Resp 16 | Wt 253.2 lb

## 2017-10-30 DIAGNOSIS — Z5112 Encounter for antineoplastic immunotherapy: Secondary | ICD-10-CM | POA: Insufficient documentation

## 2017-10-30 DIAGNOSIS — G8929 Other chronic pain: Secondary | ICD-10-CM | POA: Diagnosis not present

## 2017-10-30 DIAGNOSIS — C8311 Mantle cell lymphoma, lymph nodes of head, face, and neck: Secondary | ICD-10-CM | POA: Diagnosis not present

## 2017-10-30 DIAGNOSIS — M545 Low back pain: Secondary | ICD-10-CM | POA: Diagnosis not present

## 2017-10-30 DIAGNOSIS — M2550 Pain in unspecified joint: Secondary | ICD-10-CM | POA: Insufficient documentation

## 2017-10-30 LAB — CBC WITH DIFFERENTIAL/PLATELET
Basophils Absolute: 0 10*3/uL (ref 0–0.1)
Basophils Relative: 0 %
Eosinophils Absolute: 0.1 10*3/uL (ref 0–0.7)
Eosinophils Relative: 2 %
HCT: 39.7 % (ref 35.0–47.0)
Hemoglobin: 13.4 g/dL (ref 12.0–16.0)
Lymphocytes Relative: 16 %
Lymphs Abs: 1 10*3/uL (ref 1.0–3.6)
MCH: 30.1 pg (ref 26.0–34.0)
MCHC: 33.7 g/dL (ref 32.0–36.0)
MCV: 89.4 fL (ref 80.0–100.0)
Monocytes Absolute: 0.4 10*3/uL (ref 0.2–0.9)
Monocytes Relative: 7 %
Neutro Abs: 4.9 10*3/uL (ref 1.4–6.5)
Neutrophils Relative %: 75 %
Platelets: 182 10*3/uL (ref 150–440)
RBC: 4.44 MIL/uL (ref 3.80–5.20)
RDW: 14.8 % — ABNORMAL HIGH (ref 11.5–14.5)
WBC: 6.6 10*3/uL (ref 3.6–11.0)

## 2017-10-30 LAB — COMPREHENSIVE METABOLIC PANEL
ALT: 14 U/L (ref 14–54)
AST: 24 U/L (ref 15–41)
Albumin: 3.7 g/dL (ref 3.5–5.0)
Alkaline Phosphatase: 67 U/L (ref 38–126)
Anion gap: 8 (ref 5–15)
BUN: 11 mg/dL (ref 6–20)
CO2: 26 mmol/L (ref 22–32)
Calcium: 9.2 mg/dL (ref 8.9–10.3)
Chloride: 106 mmol/L (ref 101–111)
Creatinine, Ser: 0.83 mg/dL (ref 0.44–1.00)
GFR calc Af Amer: >60 mL/min (ref >60)
GFR calc non Af Amer: >60 mL/min (ref >60)
Glucose, Bld: 101 mg/dL — ABNORMAL HIGH (ref 65–99)
Potassium: 4.4 mmol/L (ref 3.5–5.1)
Sodium: 140 mmol/L (ref 135–145)
Total Bilirubin: 0.7 mg/dL (ref 0.3–1.2)
Total Protein: 6.7 g/dL (ref 6.5–8.1)

## 2017-10-30 MED ORDER — SODIUM CHLORIDE 0.9 % IV SOLN
Freq: Once | INTRAVENOUS | Status: AC
  Administered 2017-10-30: 10:00:00 via INTRAVENOUS
  Filled 2017-10-30: qty 1000

## 2017-10-30 MED ORDER — ACETAMINOPHEN 325 MG PO TABS
650.0000 mg | ORAL_TABLET | Freq: Once | ORAL | Status: AC
  Administered 2017-10-30: 650 mg via ORAL
  Filled 2017-10-30: qty 2

## 2017-10-30 MED ORDER — SODIUM CHLORIDE 0.9 % IV SOLN: 375.0000 mg/m2 | Freq: Once | INTRAVENOUS | Status: DC

## 2017-10-30 MED ORDER — SODIUM CHLORIDE 0.9% FLUSH
10.0000 mL | INTRAVENOUS | Status: DC | PRN
  Filled 2017-10-30: qty 10

## 2017-10-30 MED ORDER — DIPHENHYDRAMINE HCL 25 MG PO CAPS
50.0000 mg | ORAL_CAPSULE | Freq: Once | ORAL | Status: AC
  Administered 2017-10-30: 50 mg via ORAL
  Filled 2017-10-30: qty 2

## 2017-10-30 MED ORDER — HEPARIN SOD (PORK) LOCK FLUSH 100 UNIT/ML IV SOLN
500.0000 [IU] | Freq: Once | INTRAVENOUS | Status: AC | PRN
  Administered 2017-10-30: 500 [IU]
  Filled 2017-10-30: qty 5

## 2017-10-30 MED ORDER — SODIUM CHLORIDE 0.9 % IV SOLN
375.0000 mg/m2 | Freq: Once | INTRAVENOUS | Status: AC
  Administered 2017-10-30: 800 mg via INTRAVENOUS
  Filled 2017-10-30: qty 50

## 2017-10-30 NOTE — Progress Notes (Signed)
[place] OFFICE PROGRESS NOTE  Patient Care Team: Burnard Hawthorne, FNP as PCP - General (Family Medicine) Jackolyn Confer, MD (Internal Medicine)  Cancer Staging No matching staging information was found for the patient.   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 Feb 9th- START Benda-Ritux with neulasta; Prolonged neutropenia; DISCONT- Benda-Ritux;   # April 13 th 2017- START R-CHOP x1; severe/prolonged neutropenia; PET- CR; BMBx-Neg; Disc R-CHOP  # 26th MAY 2017- Start Rituxan q 95M [address] OCT 12th 2017- PET NED.   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  female pleasant patient above history of mantle cell lymphoma currently on maintenance rituximab is here for follow-up.  Denies any lumps or bumps.  Appetite is good.  Complains of chronic back pain joint pain.  Review of Systems  Constitutional: Negative for chills, diaphoresis, fever, malaise/fatigue and weight loss.  HENT: Negative for nosebleeds and sore throat.   Eyes: Negative for double vision.  Respiratory: Negative for cough, hemoptysis, sputum production, shortness of breath and wheezing.   Cardiovascular: Negative for chest pain, palpitations, orthopnea and leg swelling.  Gastrointestinal: Negative for abdominal pain, blood in stool, constipation, diarrhea, heartburn, melena, nausea and vomiting.  Genitourinary: Negative for dysuria, frequency and urgency.  Musculoskeletal: Positive for back  pain and joint pain.  Skin: Negative.  Negative for itching and rash.  Neurological: Negative for dizziness, tingling, focal weakness, weakness and headaches.  Endo/Heme/Allergies: Does not bruise/bleed easily.  Psychiatric/Behavioral: Negative for depression. The patient is not nervous/anxious and does not have insomnia.       PAST MEDICAL HISTORY :  Past Medical History:  Diagnosis Date  . Arthritis   . Collagen vascular disease ([place])   . GERD (gastroesophageal reflux disease)   . Headache(784.0)   . History of methotrexate therapy   . Hyperlipidemia    hx  . Lymphadenopathy of head and neck 01/2015   see on Thyroid ultrasound  . Lymphoma, mantle cell ([place]) 06/01/2015   bx of lymph node in right breast/Stage IV Mantle Cell Lymphoma  . Rheumatoid arthritis ([place])     PAST SURGICAL HISTORY :   Past Surgical History:  Procedure Laterality Date  . CARDIAC CATHETERIZATION  09/2004   [facility]; EF 60%  . CARDIAC CATHETERIZATION  08/2004   [facility]  . PERIPHERAL VASCULAR CATHETERIZATION N/A 07/04/2015   Procedure: [name] Insertion;  Surgeon: [name], MD;  Location: [facility];  Service: Cardiovascular;  Laterality: N/A;    FAMILY HISTORY :   Family History  Problem Relation Age of Onset  . Diabetes Mother   . Cholelithiasis Mother   . Hypertension Sister   . Diabetes Sister   . Arthritis Brother   . Breast cancer Neg Hx     SOCIAL HISTORY:   Social History   Tobacco Use  . Smoking status: Never Smoker  . Smokeless tobacco: Never Used  Substance Use Topics  . Alcohol use: No  . Drug  use: No    ALLERGIES:  has No Known Allergies.  MEDICATIONS:  Current Outpatient Medications  Medication Sig Dispense Refill  . acetaminophen (TYLENOL) 500 MG tablet Take 500 mg by mouth every 6 (six) hours as needed.    [name] albuterol (PROVENTIL HFA;VENTOLIN HFA) 108 (90 Base) MCG/ACT inhaler Inhale 2 puffs into the lungs every 6 (six) hours as needed for wheezing or shortness of  breath. 1 Inhaler 0  . Calcium Carbonate-Vit D-Min (CALTRATE 600+D PLUS) 600-800 MG-UNIT CHEW Chew 1 tablet by mouth 2 (two) times daily. 60 tablet 6  . cyanocobalamin 500 MCG tablet Take 500 mcg by mouth daily. Take two gummies daily    . fluticasone (FLONASE) 50 MCG/ACT nasal spray SPRAY TWICE IN EACH NOSTRIL ONCE DAILY  4  . guaiFENesin (MUCINEX) 600 MG 12 hr tablet Take 600 mg by mouth 2 (two) times daily.    . hydroxychloroquine (PLAQUENIL) 200 MG tablet TAKE 2 TABLETS (400 MG TOTAL) BY MOUTH DAILY. FOLLOW-UP APPT NEEDED. PLEASE CALL OFFICE FOR APPT. 180 tablet 0  . lidocaine-prilocaine (EMLA) cream Apply 1 application topically as needed. 30 g 11  . meclizine (ANTIVERT) 25 MG tablet Take 25 mg by mouth 3 (three) times daily as needed for dizziness.    . Multiple Vitamins-Minerals (MULTIVITAMIN GUMMIES ADULT) CHEW Chew by mouth. Reported on 09/06/2015    . Omega-3 1000 MG CAPS Take by mouth.    . ondansetron (ZOFRAN) 8 MG tablet Take 1 tablet (8 mg total) by mouth every 8 (eight) hours as needed for nausea or vomiting (start 3 days; after chemo). (Patient not taking: Reported on 10/30/2017) 40 tablet 0  . PREVIDENT 0.2 % SOLN RINSE AFTER THOROUGH BRUSHING  10  . prochlorperazine (COMPAZINE) 10 MG tablet Take 1 tablet (10 mg total) by mouth every 6 (six) hours as needed for nausea or vomiting. (Patient not taking: Reported on 10/30/2017) 30 tablet 0   No current facility-administered medications for this visit.    Facility-Administered Medications Ordered in Other Visits  Medication Dose Route Frequency Provider Last Rate Last Dose  . Tbo-Filgrastim (GRANIX) injection 480 mcg  480 mcg Subcutaneous Once Cammie Sickle, MD        PHYSICAL EXAMINATION: ECOG PERFORMANCE STATUS: 0 - Asymptomatic  BP 114/71 (BP Location: Left Arm, Patient Position: Sitting)   Pulse 62   Temp 97.7 F (36.5 C) (Tympanic)   Resp 16   Wt 253 lb 3.2 oz (114.9 kg)   BMI 40.87 kg/m   Filed Weights    10/30/17 0918  Weight: 253 lb 3.2 oz (114.9 kg)    GENERAL: Well-nourished well-developed; Alert, no distress and comfortable.  Accompanied by family.  EYES: no pallor or icterus OROPHARYNX: no thrush or ulceration; NECK: supple; no lymph nodes felt. LYMPH:  no palpable lymphadenopathy in the axillary or inguinal regions LUNGS: Decreased breath sounds auscultation bilaterally. No wheeze or crackles HEART/CVS: regular rate & rhythm and no murmurs; No lower extremity edema ABDOMEN:abdomen soft, non-tender and normal bowel sounds. No hepatomegaly or splenomegaly.  Musculoskeletal:no cyanosis of digits and no clubbing  PSYCH: alert & oriented x 3 with fluent speech NEURO: no focal motor/sensory deficits SKIN:  no rashes or significant lesions    LABORATORY DATA:  I have reviewed the data as listed    Component Value Date/Time   NA 140 10/30/2017 0856   NA 142 09/14/2013 1127   K 4.4 10/30/2017 0856   K 3.5 09/14/2013 1127   CL 106 10/30/2017 0856  CL 110 (H) 09/14/2013 1127   CO2 26 10/30/2017 0856   CO2 29 09/14/2013 1127   GLUCOSE 101 (H) 10/30/2017 0856   GLUCOSE 106 (H) 09/14/2013 1127   BUN 11 10/30/2017 0856   BUN 8 09/14/2013 1127   CREATININE 0.83 10/30/2017 0856   CREATININE 0.71 09/14/2013 1127   CREATININE 0.68 04/01/2013 1550   CALCIUM 9.2 10/30/2017 0856   CALCIUM 8.6 09/14/2013 1127   PROT 6.7 10/30/2017 0856   PROT 7.6 09/14/2013 1127   ALBUMIN 3.7 10/30/2017 0856   ALBUMIN 3.2 (L) 09/14/2013 1127   AST 24 10/30/2017 0856   AST 22 09/14/2013 1127   ALT 14 10/30/2017 0856   ALT 19 09/14/2013 1127   ALKPHOS 67 10/30/2017 0856   ALKPHOS 76 09/14/2013 1127   BILITOT 0.7 10/30/2017 0856   BILITOT 0.4 09/14/2013 1127   GFRNONAA >60 10/30/2017 0856   GFRNONAA >60 09/14/2013 1127   GFRAA >60 10/30/2017 0856   GFRAA >60 09/14/2013 1127    No results found for: SPEP, UPEP  Lab Results  Component Value Date   WBC 6.6 10/30/2017   NEUTROABS 4.9 10/30/2017    HGB 13.4 10/30/2017   HCT 39.7 10/30/2017   MCV 89.4 10/30/2017   PLT 182 10/30/2017      Chemistry      Component Value Date/Time   NA 140 10/30/2017 0856   NA 142 09/14/2013 1127   K 4.4 10/30/2017 0856   K 3.5 09/14/2013 1127   CL 106 10/30/2017 0856   CL 110 (H) 09/14/2013 1127   CO2 26 10/30/2017 0856   CO2 29 09/14/2013 1127   BUN 11 10/30/2017 0856   BUN 8 09/14/2013 1127   CREATININE 0.83 10/30/2017 0856   CREATININE 0.71 09/14/2013 1127   CREATININE 0.68 04/01/2013 1550      Component Value Date/Time   CALCIUM 9.2 10/30/2017 0856   CALCIUM 8.6 09/14/2013 1127   ALKPHOS 67 10/30/2017 0856   ALKPHOS 76 09/14/2013 1127   AST 24 10/30/2017 0856   AST 22 09/14/2013 1127   ALT 14 10/30/2017 0856   ALT 19 09/14/2013 1127   BILITOT 0.7 10/30/2017 0856   BILITOT 0.4 09/14/2013 1127       RADIOGRAPHIC STUDIES: I have personally reviewed the radiological images as listed and agreed with the findings in the report. No results found.   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    Currently stable no evidence of progression.  We will plan to get a CT scan in approximately 2 months to reevaluate the status of disease/to make decisions if to continue Rituxan maintenance further [especially given patient's poor tolerance/incomplete induction chemotherapy].  # Chronic arthritis- okay with NSAIDs as needed.   #Follow-up in 2 months labs [facility] CT scan prior; possible rituximab.  # 25 minutes face-to-face with the patient discussing the above plan of care; more than 50% of time spent on prognosis/ natural history; counseling and coordination.     Orders Placed This Encounter  Procedures  . CT CHEST W CONTRAST    Standing Status:   Future    Standing Expiration Date:   10/31/2018    Order  Specific Question:   If indicated for the ordered procedure, I authorize the administration of contrast media per Radiology protocol    Answer:  Yes    Order Specific Question:   Preferred imaging location?    Answer:   [facility]    Order Specific Question:   Radiology Contrast Protocol - do NOT remove file path    Answer:   [email or web address]  . CT Abdomen Pelvis W Contrast    Standing Status:   Future    Standing Expiration Date:   10/30/2018    Order Specific Question:   If indicated for the ordered procedure, I authorize the administration of contrast media per Radiology protocol    Answer:   Yes    Order Specific Question:   Preferred imaging location?    Answer:   [facility]    Order Specific Question:   Is Oral Contrast requested for this exam?    Answer:   Yes, Per Radiology protocol    Order Specific Question:   Radiology Contrast Protocol - do NOT remove file path    Answer:   [email or web address]  . CBC with Differential/Platelet    Standing Status:   Future    Standing Expiration Date:   10/31/2018  . Comprehensive metabolic panel    Standing Status:   Future    Standing Expiration Date:   10/31/2018  . Lactate dehydrogenase    Standing Status:   Future    Standing Expiration Date:   10/31/2018  . Lactate dehydrogenase    Standing Status:   Future    Standing Expiration Date:   10/31/2018   All questions were answered. The patient knows to call the clinic with any problems, questions or concerns.      Cammie Sickle, MD 11/01/2017 12:34 PM

## 2017-10-30 NOTE — Assessment & Plan Note (Addendum)
#  Mantle cell lymphoma stage IV-currently on maintenance Rituxan every 2 months [since May 2017]; most recent CT scan November 2018 NED.    Currently stable no evidence of progression.  We will plan to get a CT scan in approximately 2 months to reevaluate the status of disease/to make decisions if to continue Rituxan maintenance further [especially given patient's poor tolerance/incomplete induction chemotherapy].  # Chronic arthritis- okay with NSAIDs as needed.   #Follow-up in 2 months labs [facility] CT scan prior; possible rituximab.  # 25 minutes face-to-face with the patient discussing the above plan of care; more than 50% of time spent on prognosis/ natural history; counseling and coordination.

## 2017-11-09 ENCOUNTER — Ambulatory Visit (INDEPENDENT_AMBULATORY_CARE_PROVIDER_SITE_OTHER): Payer: Medicare HMO

## 2017-11-09 VITALS — BP 110/70 | HR 79 | Temp 99.6°F | Resp 16 | Ht 65.5 in | Wt 252.1 lb

## 2017-11-09 DIAGNOSIS — H2511 Age-related nuclear cataract, right eye: Secondary | ICD-10-CM | POA: Diagnosis not present

## 2017-11-09 DIAGNOSIS — Z0000 Encounter for general adult medical examination without abnormal findings: Secondary | ICD-10-CM | POA: Diagnosis not present

## 2017-11-09 DIAGNOSIS — H25011 Cortical age-related cataract, right eye: Secondary | ICD-10-CM | POA: Diagnosis not present

## 2017-11-09 NOTE — Progress Notes (Addendum)
Subjective:   Michelle [name] is a 77 y.o. female who presents for Medicare Annual (Subsequent) preventive examination.  Review of Systems:  No ROS.  Medicare Wellness Visit. Additional risk factors are reflected in the social history. Cardiac Risk Factors include: advanced age (>31men, >94 women);obesity (BMI >30kg/m2)     Objective:     Vitals: BP 110/70 (BP Location: Left Arm, Patient Position: Sitting, Cuff Size: Normal)   Pulse 79   Temp 99.6 F (37.6 C) (Oral)   Resp 16   Ht 5' 5.5" (1.664 m)   Wt 252 lb 1.9 oz (114.4 kg)   SpO2 93%   BMI 41.32 kg/m   Body mass index is 41.32 kg/m.  Advanced Directives 11/09/2017 10/30/2017 07/02/2017 06/19/2017 04/17/2017 02/20/2017 12/19/2016  Does Patient Have a Medical Advance Directive? No No No No No No No  Does patient want to make changes to medical advance directive? - - - - - - -  Would patient like information on creating a medical advance directive? No - Patient declined No - Patient declined No - Patient declined - No - Patient declined No - Patient declined No - Patient declined    Tobacco Social History   Tobacco Use  Smoking Status Never Smoker  Smokeless Tobacco Never Used     Counseling given: Not Answered   Clinical Intake:  Pre-visit preparation completed: Yes  Pain : No/denies pain     Nutritional Status: BMI > 30  Obese Diabetes: No  How often do you need to have someone help you when you read instructions, pamphlets, or other written materials from your doctor or pharmacy?: 1 - Never  Interpreter Needed?: No     Past Medical History:  Diagnosis Date  . Arthritis   . Collagen vascular disease ([place])   . GERD (gastroesophageal reflux disease)   . Headache(784.0)   . History of methotrexate therapy   . Hyperlipidemia    hx  . Lymphadenopathy of head and neck 01/2015   see on Thyroid ultrasound  . 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 Rheumatoid arthritis [facility])    Past Surgical History:  Procedure Laterality Date  . CARDIAC CATHETERIZATION  09/2004   [facility]; EF 60%  . CARDIAC CATHETERIZATION  08/2004   [facility]  . PERIPHERAL VASCULAR CATHETERIZATION N/A 07/04/2015   Procedure: [name] Insertion;  Surgeon: [name], MD;  Location: [place] [facility];  Service: Cardiovascular;  Laterality: N/A;   Family History  Problem Relation Age of Onset  . Diabetes Mother   . Cholelithiasis Mother   . Hypertension Sister   . Diabetes Sister   . Arthritis Brother   . Breast cancer Neg Hx    Social History   Socioeconomic History  . Marital status: Married    Spouse name: Not on file  . Number of children: Not on file  . Years of education: Not on file  . Highest education level: Not on file  Occupational History  . Not on file  Social Needs  . Financial resource strain: Not hard at all  . Food insecurity:    Worry: Never true    Inability: Never true  . Transportation needs:    Medical: No    Non-medical: No  Tobacco Use  . Smoking status: Never Smoker  . Smokeless tobacco: Never Used  Substance and Sexual Activity  . Alcohol use: No  . Drug  use: No  . Sexual activity: Never  Lifestyle  . Physical activity:    Days per week: Not on file    Minutes per session: Not on file  . Stress: Not at all  Relationships  . Social connections:    Talks on phone: Not on file    Gets together: Not on file    Attends religious service: Not on file    Active member of club or organization: Not on file    Attends meetings of clubs or organizations: Not on file    Relationship status: Not on file  Other Topics Concern  . Not on file  Social History Narrative   ** Merged History Encounter **       Lives in [place]. Works as Psychiatrist.    Outpatient Encounter Medications as of 11/09/2017  Medication Sig  . acetaminophen (TYLENOL) 500 MG tablet Take 500 mg by mouth every 6 (six) hours as needed.    [name] albuterol (PROVENTIL HFA;VENTOLIN HFA) 108 (90 Base) MCG/ACT inhaler Inhale 2 puffs into the lungs every 6 (six) hours as needed for wheezing or shortness of breath.  . Calcium Carbonate-Vit D-Min (CALTRATE 600+D PLUS) 600-800 MG-UNIT CHEW Chew 1 tablet by mouth 2 (two) times daily.  . cyanocobalamin 500 MCG tablet Take 500 mcg by mouth daily. Take two gummies daily  . fluticasone (FLONASE) 50 MCG/ACT nasal spray SPRAY TWICE IN EACH NOSTRIL ONCE DAILY  . guaiFENesin (MUCINEX) 600 MG 12 hr tablet Take 600 mg by mouth 2 (two) times daily.  . hydroxychloroquine (PLAQUENIL) 200 MG tablet TAKE 2 TABLETS (400 MG TOTAL) BY MOUTH DAILY. FOLLOW-UP APPT NEEDED. PLEASE CALL OFFICE FOR APPT.  [name] lidocaine-prilocaine (EMLA) cream Apply 1 application topically as needed.  . meclizine (ANTIVERT) 25 MG tablet Take 25 mg by mouth 3 (three) times daily as needed for dizziness.  . Multiple Vitamins-Minerals (MULTIVITAMIN GUMMIES ADULT) CHEW Chew by mouth. Reported on 09/06/2015  . Omega-3 1000 MG CAPS Take by mouth.  . ondansetron (ZOFRAN) 8 MG tablet Take 1 tablet (8 mg total) by mouth every 8 (eight) hours as needed for nausea or vomiting (start 3 days; after chemo).  [name] PREVIDENT 0.2 % SOLN RINSE AFTER THOROUGH BRUSHING  . prochlorperazine (COMPAZINE) 10 MG tablet Take 1 tablet (10 mg total) by mouth every 6 (six) hours as needed for nausea or vomiting.   Facility-Administered Encounter Medications as of 11/09/2017  Medication  . Tbo-Filgrastim (GRANIX) injection 480 mcg    Activities of Daily Living In your present state of health, do you have any difficulty performing the following activities: 11/09/2017  Hearing? N  Vision? N  Difficulty concentrating or making decisions? N  Walking or climbing stairs? Y  Comment Unsteady gait  Dressing or bathing? N  Doing errands, shopping? N  Preparing Food and eating ? N  Using the Toilet? N  In the past six months, have you accidently leaked urine? N  Do you  have problems with loss of bowel control? N  Managing your Medications? N  Managing your Finances? N  Housekeeping or managing your Housekeeping? N  Some recent data might be hidden    Patient Care Team: Burnard Hawthorne, FNP as PCP - General (Family Medicine) Jackolyn Confer, MD (Internal Medicine)    Assessment:   This is a routine wellness examination for [place].  The goal of the wellness visit is to assist the patient how to close the gaps in care and create a preventative care  plan for the patient.   The roster of all physicians providing medical care to patient is listed in the Snapshot section of the chart.  Taking calcium VIT D as appropriate/Osteoporosis risk reviewed.    Safety issues reviewed; Smoke and carbon monoxide detectors in the home. No firearms in the home. Wears seatbelts when driving or riding with others. No violence in the home.  They do not have excessive sun exposure.  Discussed the need for sun protection: hats, long sleeves and the use of sunscreen if there is significant sun exposure.  Patient is alert, normal appearance, oriented to person/place/and time.  Correctly identified the president of the [place] and recalls of 2/3 words. Performs simple calculations and can read correct time from watch face. Displays appropriate judgement.  No new identified risk were noted.  No failures at ADL's or IADL's.  Ambulates with cane.   BMI- discussed the importance of a healthy diet, water intake and the benefits of aerobic exercise. Educational material provided.   24 hour diet recall: Regular diet   Dental- UTD  Eye- Visual acuity not assessed per patient preference since they have regular follow up with the ophthalmologist.  Wears corrective lenses.  Sleep patterns- Difficulty sleeping the last 2 weeks due to cough.   Health maintenance gaps- closed.  Patient Concerns: Cough x2 weeks.  Minimal mucus. Mucinex and flonase in use. C/O soreness in her  back when breathing and coughing.  States her back is somewhat tender to touch.  No trauma.  Deferred to pcp for follow up. No availability for scheduling today.  I encouraged her to go to Urgent Care and/or schedule with pcp as soon as possible for follow up.  Declined Urgent Care.  Scheduled with pcp in 4 days.   Exercise Activities and Dietary recommendations Current Exercise Habits: The patient does not participate in regular exercise at present  Goals    . Healthy Lifetsyle     Stay hydrated Healthy diet Walk for exercise as tolerated       Fall Risk Fall Risk  11/09/2017 01/29/2017 11/07/2016 01/12/2014 08/19/2012  Falls in the past year? No No Yes No No  Number falls in past yr: - - 1 - -  Injury with Fall? - - Yes - -  Comment - - Twisted L ankle 2 months a dog.  Medical attention declined. - -  Follow up - - Education provided;Falls prevention discussed - -   Depression Screen PHQ 2/9 Scores 11/09/2017 01/29/2017 11/07/2016 01/12/2014  PHQ - 2 Score 0 0 0 2  PHQ- 9 Score - - 0 -     Cognitive Function MMSE - Mini Mental State Exam 11/07/2016  Orientation to time 5  Orientation to Place 5  Registration 3  Attention/ Calculation 3  Attention/Calculation-comments Some difficulty calculating   Recall 3  Language- name 2 objects 2  Language- repeat 1  Language- follow 3 step command 3  Language- read & follow direction 1  Write a sentence 1  Copy design 1  Total score 28     6CIT Screen 11/09/2017  What Year? 0 points  What month? 0 points  What time? 0 points  Count back from 20 0 points  Months in reverse 4 points  Repeat phrase 0 points  Total Score 4    Immunization History  Administered Date(s) Administered  . Influenza Split 02/18/2012  . Influenza, High Dose Seasonal PF 01/29/2017  . Influenza,inj,Quad PF,6+ Mos 04/01/2013, 03/02/2014, 06/25/2015  . Influenza-Unspecified 03/14/2016  .  Pneumococcal Conjugate-13 01/12/2014  . Pneumococcal Polysaccharide-23  11/12/2009  . Tdap 11/12/2009   Screening Tests Health Maintenance  Topic Date Due  . INFLUENZA VACCINE  12/31/2017  . MAMMOGRAM  03/13/2019  . TETANUS/TDAP  11/13/2019  . COLONOSCOPY  11/12/2020  . DEXA SCAN  Completed  . PNA vac Low Risk Adult  Completed      Plan:    End of life planning; Advance aging; Advanced directives discussed. Copy of current HCPOA/Living Will declined.    I have personally reviewed and noted the following in the patient's chart:   . Medical and social history . Use of alcohol, tobacco or illicit drugs  . Current medications and supplements . Functional ability and status . Nutritional status . Physical activity . Advanced directives . List of other physicians . Hospitalizations, surgeries, and ER visits in previous 12 months . Vitals . Screenings to include cognitive, depression, and falls . Referrals and appointments  In addition, I have reviewed and discussed with patient certain preventive protocols, quality metrics, and best practice recommendations. A written personalized care plan for preventive services as well as general preventive health recommendations were provided to patient.     Varney Biles, LPN  3/55/7322   Agree with plan. Will evaluate cough during OV.  Mable Paris, NP

## 2017-11-09 NOTE — Patient Instructions (Addendum)
  Michelle [name] , Thank you for taking time to come for your Medicare Wellness Visit. I appreciate your ongoing commitment to your health goals. Please review the following plan we discussed and let me know if I can assist you in the future.   Follow up as needed.    Have a great day!  These are the goals we discussed: Goals    . Healthy Lifetsyle     Stay hydrated Healthy diet Walk for exercise as tolerated       This is a list of the screening recommended for you and due dates:  Health Maintenance  Topic Date Due  . Flu Shot  12/31/2017  . Mammogram  03/13/2019  . Tetanus Vaccine  11/13/2019  . Colon Cancer Screening  11/12/2020  . DEXA scan (bone density measurement)  Completed  . Pneumonia vaccines  Completed

## 2017-11-10 ENCOUNTER — Other Ambulatory Visit: Payer: Self-pay | Admitting: Family

## 2017-11-10 DIAGNOSIS — E785 Hyperlipidemia, unspecified: Secondary | ICD-10-CM

## 2017-11-13 ENCOUNTER — Ambulatory Visit (INDEPENDENT_AMBULATORY_CARE_PROVIDER_SITE_OTHER): Payer: Medicare HMO

## 2017-11-13 ENCOUNTER — Ambulatory Visit (INDEPENDENT_AMBULATORY_CARE_PROVIDER_SITE_OTHER): Payer: Medicare HMO | Admitting: Family

## 2017-11-13 ENCOUNTER — Encounter: Payer: Self-pay | Admitting: Family

## 2017-11-13 VITALS — BP 122/80 | HR 68 | Temp 98.4°F | Resp 16 | Wt 247.2 lb

## 2017-11-13 DIAGNOSIS — J40 Bronchitis, not specified as acute or chronic: Secondary | ICD-10-CM

## 2017-11-13 DIAGNOSIS — J9811 Atelectasis: Secondary | ICD-10-CM | POA: Diagnosis not present

## 2017-11-13 MED ORDER — ALBUTEROL SULFATE HFA 108 (90 BASE) MCG/ACT IN AERS: 2.0000 | INHALATION_SPRAY | Freq: Four times a day (QID) | RESPIRATORY_TRACT | 0 refills | Status: DC | PRN

## 2017-11-13 MED ORDER — ALBUTEROL SULFATE (2.5 MG/3ML) 0.083% IN NEBU
2.5000 mg | INHALATION_SOLUTION | Freq: Once | RESPIRATORY_TRACT | Status: AC
Start: 2017-11-13 — End: 2017-11-13
  Administered 2017-11-13: 2.5 mg via RESPIRATORY_TRACT

## 2017-11-13 MED ORDER — DOXYCYCLINE HYCLATE 100 MG PO CAPS: 100.0000 mg | ORAL_CAPSULE | Freq: Two times a day (BID) | ORAL | 0 refills | Status: AC

## 2017-11-13 MED ORDER — BENZONATATE 100 MG PO CAPS: 100.0000 mg | ORAL_CAPSULE | Freq: Three times a day (TID) | ORAL | 1 refills | Status: DC | PRN

## 2017-11-13 NOTE — Progress Notes (Signed)
Subjective:    Patient ID: Michelle [name], female    DOB: 08/08/1940, 77 y.o.   MRN: [id number]  CC: Michelle [name] is a 77 y.o. female who presents today for an acute visit.    HPI: CC: productive cough x 2 weeks, worsening.  Endorses SOB, chills. No fever, lip swelling, sinus pain, ear pain.   Tried mucinex without relief.    Never smoker.   HISTORY:  Past Medical History:  Diagnosis Date  . Arthritis   . Collagen vascular disease ([place])   . GERD (gastroesophageal reflux disease)   . Headache(784.0)   . History of methotrexate therapy   . Hyperlipidemia    hx  . Lymphadenopathy of head and neck 01/2015   see on Thyroid ultrasound  . 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 Rheumatoid arthritis [facility])    Past Surgical History:  Procedure Laterality Date  . CARDIAC CATHETERIZATION  09/2004   [facility]; EF 60%  . CARDIAC CATHETERIZATION  08/2004   [facility]  . PERIPHERAL VASCULAR CATHETERIZATION N/A 07/04/2015   Procedure: [name] Insertion;  Surgeon: [name], MD;  Location: [place] [facility];  Service: Cardiovascular;  Laterality: N/A;   Family History  Problem Relation Age of Onset  . Diabetes Mother   . Cholelithiasis Mother   . Hypertension Sister   . Diabetes Sister   . Arthritis Brother   . Breast cancer Neg Hx     Allergies: Patient has no known allergies. Current Outpatient Medications on File Prior to Visit  Medication Sig Dispense Refill  . acetaminophen (TYLENOL) 500 MG tablet Take 500 mg by mouth every 6 (six) hours as needed.    [name] albuterol (PROVENTIL HFA;VENTOLIN HFA) 108 (90 Base) MCG/ACT inhaler Inhale 2 puffs into the lungs every 6 (six) hours as needed for wheezing or shortness of breath. 1 Inhaler 0  . Calcium Carbonate-Vit D-Min (CALTRATE 600+D PLUS) 600-800 MG-UNIT CHEW Chew 1 tablet by mouth 2 (two) times daily. 60 tablet 6  . cyanocobalamin 500 MCG tablet Take 500 mcg by  mouth daily. Take two gummies daily    . fluticasone (FLONASE) 50 MCG/ACT nasal spray SPRAY TWICE IN EACH NOSTRIL ONCE DAILY  4  . guaiFENesin (MUCINEX) 600 MG 12 hr tablet Take 600 mg by mouth 2 (two) times daily.    . hydroxychloroquine (PLAQUENIL) 200 MG tablet TAKE 2 TABLETS (400 MG TOTAL) BY MOUTH DAILY. FOLLOW-UP APPT NEEDED. PLEASE CALL OFFICE FOR APPT. 180 tablet 0  . lidocaine-prilocaine (EMLA) cream Apply 1 application topically as needed. 30 g 11  . meclizine (ANTIVERT) 25 MG tablet Take 25 mg by mouth 3 (three) times daily as needed for dizziness.    . Multiple Vitamins-Minerals (MULTIVITAMIN GUMMIES ADULT) CHEW Chew by mouth. Reported on 09/06/2015    . Omega-3 1000 MG CAPS Take by mouth.    . ondansetron (ZOFRAN) 8 MG tablet Take 1 tablet (8 mg total) by mouth every 8 (eight) hours as needed for nausea or vomiting (start 3 days; after chemo). 40 tablet 0  . PREVIDENT 0.2 % SOLN RINSE AFTER THOROUGH BRUSHING  10  . prochlorperazine (COMPAZINE) 10 MG tablet Take 1 tablet (10 mg total) by mouth every 6 (six) hours as needed for nausea or vomiting. 30 tablet 0   Current Facility-Administered Medications on File Prior to Visit  Medication Dose Route Frequency Provider Last Rate Last Dose  . Tbo-Filgrastim (  GRANIX) injection 480 mcg  480 mcg Subcutaneous Once Cammie Sickle, MD        Social History   Tobacco Use  . Smoking status: Never Smoker  . Smokeless tobacco: Never Used  Substance Use Topics  . Alcohol use: No  . Drug use: No    Review of Systems  Constitutional: Negative for chills and fever.  HENT: Positive for congestion. Negative for sinus pain, sore throat and trouble swallowing.   Respiratory: Positive for cough, shortness of breath and wheezing.   Cardiovascular: Negative for chest pain and palpitations.  Gastrointestinal: Negative for nausea and vomiting.      Objective:    BP 122/80 (BP Location: Left Arm, Patient Position: Sitting, Cuff Size: Normal)    Pulse 68   Temp 98.4 F (36.9 C) (Oral)   Resp 16   Wt 247 lb 4 oz (112.2 kg)   SpO2 98%   BMI 40.52 kg/m    Physical Exam  Constitutional: She appears well-developed and well-nourished.  HENT:  Head: Normocephalic and atraumatic.  Right Ear: Hearing, tympanic membrane, external ear and ear canal normal. No drainage, swelling or tenderness. No foreign bodies. Tympanic membrane is not erythematous and not bulging. No middle ear effusion. No decreased hearing is noted.  Left Ear: Hearing, tympanic membrane, external ear and ear canal normal. No drainage, swelling or tenderness. No foreign bodies. Tympanic membrane is not erythematous and not bulging.  No middle ear effusion. No decreased hearing is noted.  Nose: Nose normal. No rhinorrhea. Right sinus exhibits no maxillary sinus tenderness and no frontal sinus tenderness. Left sinus exhibits no maxillary sinus tenderness and no frontal sinus tenderness.  Mouth/Throat: Uvula is midline, oropharynx is clear and moist and mucous membranes are normal. No oropharyngeal exudate, posterior oropharyngeal edema, posterior oropharyngeal erythema or tonsillar abscesses.  Eyes: Conjunctivae are normal.  Cardiovascular: Regular rhythm, normal heart sounds and normal pulses.  Pulmonary/Chest: Effort normal and breath sounds normal. She has no wheezes. She has no rhonchi. She has no rales.  Few expiratory wheezes heard  Lymphadenopathy:       Head (right side): No submental, no submandibular, no tonsillar, no preauricular, no posterior auricular and no occipital adenopathy present.       Head (left side): No submental, no submandibular, no tonsillar, no preauricular, no posterior auricular and no occipital adenopathy present.    She has no cervical adenopathy.  Neurological: She is alert.  Skin: Skin is warm and dry.  Psychiatric: She has a normal mood and affect. Her speech is normal and behavior is normal. Thought content normal.  Vitals  reviewed. Patient felt significantly better after albuterol treatment. Lung sounds clear and increased      Assessment & Plan:  1. Bronchitis Well apearing. No acute respiratory distress.  Reassured as patient felt much better after nebulizer treatment.  Advised her to use her albuterol inhaler every 6 hours.  Continue Mucinex.  Based on duration of symptoms, will go ahead and give doxycycline.  Pending chest x-ray.  Return precautions given    I am having [name] maintain her CALTRATE 600+D PLUS MINERALS, MULTIVITAMIN GUMMIES ADULT, lidocaine-prilocaine, prochlorperazine, ondansetron, fluticasone, vitamin B-12, acetaminophen, PREVIDENT, meclizine, Omega-3, albuterol, guaiFENesin, and hydroxychloroquine.   No orders of the defined types were placed in this encounter.   Return precautions given.   Risks, benefits, and alternatives of the medications and treatment plan prescribed today were discussed, and patient expressed understanding.   Education regarding symptom management and diagnosis  given to patient on AVS.  Continue to follow with Burnard Hawthorne, FNP for routine health maintenance.   [name] and I agreed with plan.   Mable Paris, FNP

## 2017-11-13 NOTE — Patient Instructions (Signed)
Start doxycycline  Use albuterol every 6 hours for first 24 hours to get good medication into the lungs and loosen congestion; after, you may use as needed and eventually stop all together when cough resolves.  Ensure to take probiotics while on antibiotics and also for 2 weeks after completion. It is important to re-colonize the gut with good bacteria and also to prevent any diarrheal infections associated with antibiotic use.   Chest xray  Postpone eye surgery if symptoms not improved  Let me know how you are doing

## 2017-11-17 DIAGNOSIS — H2511 Age-related nuclear cataract, right eye: Secondary | ICD-10-CM | POA: Diagnosis not present

## 2017-11-17 DIAGNOSIS — H25811 Combined forms of age-related cataract, right eye: Secondary | ICD-10-CM | POA: Diagnosis not present

## 2017-11-26 ENCOUNTER — Ambulatory Visit
Admission: RE | Admit: 2017-11-26 | Discharge: 2017-11-26 | Disposition: A | Discharge status: Home / Self Care | Payer: Medicare HMO | Source: Ambulatory Visit | Attending: Family | Admitting: Family

## 2017-11-26 DIAGNOSIS — M85852 Other specified disorders of bone density and structure, left thigh: Secondary | ICD-10-CM | POA: Diagnosis not present

## 2017-11-26 DIAGNOSIS — Z13820 Encounter for screening for osteoporosis: Secondary | ICD-10-CM | POA: Insufficient documentation

## 2017-11-26 DIAGNOSIS — M85832 Other specified disorders of bone density and structure, left forearm: Secondary | ICD-10-CM | POA: Insufficient documentation

## 2017-11-26 DIAGNOSIS — Z780 Asymptomatic menopausal state: Secondary | ICD-10-CM | POA: Diagnosis not present

## 2017-11-26 DIAGNOSIS — H2511 Age-related nuclear cataract, right eye: Secondary | ICD-10-CM | POA: Diagnosis not present

## 2017-12-05 ENCOUNTER — Other Ambulatory Visit: Payer: Self-pay | Admitting: Family

## 2017-12-05 DIAGNOSIS — J40 Bronchitis, not specified as acute or chronic: Secondary | ICD-10-CM

## 2017-12-21 ENCOUNTER — Ambulatory Visit
Admission: RE | Admit: 2017-12-21 | Discharge: 2017-12-21 | Disposition: A | Discharge status: Home / Self Care | Payer: Medicare HMO | Source: Ambulatory Visit | Attending: Internal Medicine | Admitting: Internal Medicine

## 2017-12-21 DIAGNOSIS — J181 Lobar pneumonia, unspecified organism: Secondary | ICD-10-CM | POA: Diagnosis not present

## 2017-12-21 DIAGNOSIS — C8311 Mantle cell lymphoma, lymph nodes of head, face, and neck: Secondary | ICD-10-CM

## 2017-12-21 DIAGNOSIS — Z8572 Personal history of non-Hodgkin lymphomas: Secondary | ICD-10-CM | POA: Diagnosis not present

## 2017-12-21 LAB — POCT I-STAT CREATININE: Creatinine, Ser: 0.8 mg/dL (ref 0.44–1.00)

## 2017-12-21 MED ORDER — IOPAMIDOL (ISOVUE-300) INJECTION 61%
100.0000 mL | Freq: Once | INTRAVENOUS | Status: AC | PRN
  Administered 2017-12-21: 150 mL via INTRAVENOUS

## 2017-12-25 ENCOUNTER — Encounter: Payer: Self-pay | Admitting: Internal Medicine

## 2017-12-25 ENCOUNTER — Inpatient Hospital Stay: Payer: Medicare HMO

## 2017-12-25 ENCOUNTER — Other Ambulatory Visit: Payer: Self-pay

## 2017-12-25 ENCOUNTER — Inpatient Hospital Stay: Payer: Medicare HMO | Attending: Internal Medicine | Admitting: Internal Medicine

## 2017-12-25 VITALS — BP 129/74 | HR 66 | Temp 97.4°F | Wt 252.0 lb

## 2017-12-25 DIAGNOSIS — M1380 Other specified arthritis, unspecified site: Secondary | ICD-10-CM | POA: Insufficient documentation

## 2017-12-25 DIAGNOSIS — C8311 Mantle cell lymphoma, lymph nodes of head, face, and neck: Secondary | ICD-10-CM | POA: Diagnosis not present

## 2017-12-25 DIAGNOSIS — Z95828 Presence of other vascular implants and grafts: Secondary | ICD-10-CM

## 2017-12-25 DIAGNOSIS — R5383 Other fatigue: Secondary | ICD-10-CM | POA: Diagnosis not present

## 2017-12-25 DIAGNOSIS — R05 Cough: Secondary | ICD-10-CM | POA: Diagnosis not present

## 2017-12-25 DIAGNOSIS — J181 Lobar pneumonia, unspecified organism: Secondary | ICD-10-CM | POA: Diagnosis not present

## 2017-12-25 DIAGNOSIS — R0609 Other forms of dyspnea: Secondary | ICD-10-CM | POA: Insufficient documentation

## 2017-12-25 DIAGNOSIS — R059 Cough, unspecified: Secondary | ICD-10-CM

## 2017-12-25 LAB — COMPREHENSIVE METABOLIC PANEL
ALT: 12 U/L (ref 0–44)
AST: 23 U/L (ref 15–41)
Albumin: 3.5 g/dL (ref 3.5–5.0)
Alkaline Phosphatase: 68 U/L (ref 38–126)
Anion gap: 10 (ref 5–15)
BUN: 9 mg/dL (ref 8–23)
CO2: 26 mmol/L (ref 22–32)
Calcium: 8.7 mg/dL — ABNORMAL LOW (ref 8.9–10.3)
Chloride: 106 mmol/L (ref 98–111)
Creatinine, Ser: 0.6 mg/dL (ref 0.44–1.00)
GFR calc Af Amer: >60 mL/min (ref >60)
GFR calc non Af Amer: >60 mL/min (ref >60)
Glucose, Bld: 101 mg/dL — ABNORMAL HIGH (ref 70–99)
Potassium: 3.5 mmol/L (ref 3.5–5.1)
Sodium: 142 mmol/L (ref 135–145)
Total Bilirubin: 0.6 mg/dL (ref 0.3–1.2)
Total Protein: 6.7 g/dL (ref 6.5–8.1)

## 2017-12-25 LAB — CBC WITH DIFFERENTIAL/PLATELET
Basophils Absolute: 0 10*3/uL (ref 0–0.1)
Basophils Relative: 0 %
Eosinophils Absolute: 0.2 10*3/uL (ref 0–0.7)
Eosinophils Relative: 3 %
HCT: 39.3 % (ref 35.0–47.0)
Hemoglobin: 13 g/dL (ref 12.0–16.0)
Lymphocytes Relative: 13 %
Lymphs Abs: 1 10*3/uL (ref 1.0–3.6)
MCH: 29.6 pg (ref 26.0–34.0)
MCHC: 33 g/dL (ref 32.0–36.0)
MCV: 89.9 fL (ref 80.0–100.0)
Monocytes Absolute: 0.5 10*3/uL (ref 0.2–0.9)
Monocytes Relative: 6 %
Neutro Abs: 6 10*3/uL (ref 1.4–6.5)
Neutrophils Relative %: 78 %
Platelets: 231 10*3/uL (ref 150–440)
RBC: 4.38 MIL/uL (ref 3.80–5.20)
RDW: 15.1 % — ABNORMAL HIGH (ref 11.5–14.5)
WBC: 7.8 10*3/uL (ref 3.6–11.0)

## 2017-12-25 LAB — LACTATE DEHYDROGENASE: LDH: 189 U/L (ref 98–192)

## 2017-12-25 MED ORDER — HEPARIN SOD (PORK) LOCK FLUSH 100 UNIT/ML IV SOLN
500.0000 [IU] | Freq: Once | INTRAVENOUS | Status: AC
  Administered 2017-12-25: 500 [IU]
  Filled 2017-12-25: qty 5

## 2017-12-25 MED ORDER — LEVOFLOXACIN 500 MG PO TABS: 500.0000 mg | ORAL_TABLET | Freq: Every day | ORAL | 0 refills | Status: DC

## 2017-12-25 MED ORDER — HYDROCOD POLST-CPM POLST ER 10-8 MG/5ML PO SUER: 5.0000 mL | Freq: Every evening | ORAL | 0 refills | Status: DC | PRN

## 2017-12-25 MED ORDER — SODIUM CHLORIDE 0.9% FLUSH
10.0000 mL | Freq: Once | INTRAVENOUS | Status: AC
  Filled 2017-12-25: qty 10

## 2017-12-25 NOTE — Progress Notes (Signed)
[facility] OFFICE PROGRESS NOTE  Patient Care Team: Burnard Hawthorne, FNP as PCP - General (Family Medicine) Jackolyn Confer, MD (Internal Medicine)  Cancer Staging No matching staging information was found for the patient.   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 Feb 9th- START Benda-Ritux with neulasta; Prolonged neutropenia; DISCONT- Benda-Ritux;   # April 13 th 2017- START R-CHOP x1; severe/prolonged neutropenia; PET- CR; BMBx-Neg; Disc R-CHOP  # 26th MAY 2017- Start Rituxan q 81M Main OCT 12th 2017- PET NED.   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  female pleasant patient above history of mantle cell lymphoma currently on maintenance Rituxan is here for follow-up/review the results of the CT scan.  Patient complains of cough for the last 2 months productive; no blood.  No fever no chills.  The patient was recently evaluated PCP; had breathing treatments /antitussive without any significant improvement.  Complains of fatigue.  Complains of shortness of breath with exertion.  No lumps or bumps.  Review of Systems  Constitutional: Positive for malaise/fatigue. Negative for chills, diaphoresis, fever and weight loss.  HENT: Negative for nosebleeds and sore throat.   Eyes: Negative for double vision.  Respiratory: Positive for cough, sputum production and shortness of breath. Negative for hemoptysis and wheezing.   Cardiovascular: Negative for chest pain,  palpitations, orthopnea and leg swelling.  Gastrointestinal: Negative for abdominal pain, blood in stool, constipation, diarrhea, heartburn, melena, nausea and vomiting.  Genitourinary: Negative for dysuria, frequency and urgency.  Musculoskeletal: Negative for back pain and joint pain.  Skin: Negative.  Negative for itching and rash.  Neurological: Negative for dizziness, tingling, focal weakness, weakness and headaches.  Endo/Heme/Allergies: Does not bruise/bleed easily.  Psychiatric/Behavioral: Negative for depression. The patient is not nervous/anxious and does not have insomnia.       PAST MEDICAL HISTORY :  Past Medical History:  Diagnosis Date  . Arthritis   . Collagen vascular disease ([place])   . GERD (gastroesophageal reflux disease)   . Headache(784.0)   . History of methotrexate therapy   . Hyperlipidemia    hx  . Lymphadenopathy of head and neck 01/2015   see on Thyroid ultrasound  . Lymphoma, mantle cell ([place]) 06/01/2015   bx of lymph node in right breast/Stage IV Mantle Cell Lymphoma  . Rheumatoid arthritis ([place])     PAST SURGICAL HISTORY :   Past Surgical History:  Procedure Laterality Date  . CARDIAC CATHETERIZATION  09/2004   [facility]; EF 60%  . CARDIAC CATHETERIZATION  08/2004   [facility]  . PERIPHERAL VASCULAR CATHETERIZATION N/A 07/04/2015   Procedure: [name] Insertion;  Surgeon: [name], MD;  Location: [facility];  Service: Cardiovascular;  Laterality: N/A;    FAMILY HISTORY :   Family History  Problem Relation Age of Onset  . Diabetes Mother   . Cholelithiasis Mother   . Hypertension Sister   . Diabetes Sister   . Arthritis Brother   .  Breast cancer Neg Hx     SOCIAL HISTORY:   Social History   Tobacco Use  . Smoking status: Never Smoker  . Smokeless tobacco: Never Used  Substance Use Topics  . Alcohol use: No  . Drug use: No    ALLERGIES:  has No Known Allergies.  MEDICATIONS:  Current Outpatient Medications  Medication Sig  Dispense Refill  . acetaminophen (TYLENOL) 500 MG tablet Take 500 mg by mouth every 6 (six) hours as needed.    . Calcium Carbonate-Vit D-Min (CALTRATE 600+D PLUS) 600-800 MG-UNIT CHEW Chew 1 tablet by mouth 2 (two) times daily. 60 tablet 6  . cyanocobalamin 500 MCG tablet Take 500 mcg by mouth daily. Take two gummies daily    . fluticasone (FLONASE) 50 MCG/ACT nasal spray SPRAY TWICE IN EACH NOSTRIL ONCE DAILY  4  . guaiFENesin (MUCINEX) 600 MG 12 hr tablet Take 600 mg by mouth 2 (two) times daily.    . hydroxychloroquine (PLAQUENIL) 200 MG tablet TAKE 2 TABLETS (400 MG TOTAL) BY MOUTH DAILY. FOLLOW-UP APPT NEEDED. PLEASE CALL OFFICE FOR APPT. 180 tablet 0  . lidocaine-prilocaine (EMLA) cream Apply 1 application topically as needed. 30 g 11  . meclizine (ANTIVERT) 25 MG tablet Take 25 mg by mouth 3 (three) times daily as needed for dizziness.    . Multiple Vitamins-Minerals (MULTIVITAMIN GUMMIES ADULT) CHEW Chew by mouth. Reported on 09/06/2015    . Omega-3 1000 MG CAPS Take by mouth.    . ondansetron (ZOFRAN) 8 MG tablet Take 1 tablet (8 mg total) by mouth every 8 (eight) hours as needed for nausea or vomiting (start 3 days; after chemo). 40 tablet 0  . PREVIDENT 0.2 % SOLN RINSE AFTER THOROUGH BRUSHING  10  . prochlorperazine (COMPAZINE) 10 MG tablet Take 1 tablet (10 mg total) by mouth every 6 (six) hours as needed for nausea or vomiting. 30 tablet 0  . VENTOLIN HFA 108 (90 Base) MCG/ACT inhaler TAKE 2 PUFFS BY MOUTH EVERY 6 HOURS AS NEEDED FOR WHEEZE OR SHORTNESS OF BREATH 18 Inhaler 5  . benzonatate (TESSALON PERLES) 100 MG capsule Take 1 capsule (100 mg total) by mouth 3 (three) times daily as needed for cough. (Patient not taking: Reported on 12/25/2017) 30 capsule 1  . chlorpheniramine-HYDROcodone (TUSSIONEX) 10-8 MG/5ML SUER Take 5 mLs by mouth at bedtime as needed for cough. 140 mL 0  . levofloxacin (LEVAQUIN) 500 MG tablet Take 1 tablet (500 mg total) by mouth daily. 10 tablet 0   No  current facility-administered medications for this visit.    Facility-Administered Medications Ordered in Other Visits  Medication Dose Route Frequency Provider Last Rate Last Dose  . sodium chloride flush (NS) 0.9 % injection 10 mL  10 mL Intravenous Once Cammie Sickle, MD      . Tbo-Filgrastim [facility]) injection 480 mcg  480 mcg Subcutaneous Once Cammie Sickle, MD        PHYSICAL EXAMINATION: ECOG PERFORMANCE STATUS: 0 - Asymptomatic  BP 129/74 (BP Location: Right Arm, Patient Position: Sitting)   Pulse 66   Temp (!) 97.4 F (36.3 C)   Wt 252 lb (114.3 kg)   BMI 41.30 kg/m   Filed Weights   12/25/17 0903  Weight: 252 lb (114.3 kg)    GENERAL: Well-nourished well-developed; Alert, no distress and comfortable. Accompanied by family.  EYES: no pallor or icterus OROPHARYNX: no thrush or ulceration; NECK: supple; no lymph nodes felt. LYMPH:  no palpable lymphadenopathy in the axillary or inguinal  regions LUNGS: Decreased breath sounds auscultation bilaterally.  Positive for crackles bilaterally at the bases left more than right. HEART/CVS: regular rate & rhythm and no murmurs; No lower extremity edema ABDOMEN:abdomen soft, non-tender and normal bowel sounds. No hepatomegaly or splenomegaly.  Musculoskeletal:no cyanosis of digits and no clubbing  PSYCH: alert & oriented x 3 with fluent speech NEURO: no focal motor/sensory deficits SKIN:  no rashes or significant lesions    LABORATORY DATA:  I have reviewed the data as listed    Component Value Date/Time   NA 142 12/25/2017 0835   NA 142 09/14/2013 1127   K 3.5 12/25/2017 0835   K 3.5 09/14/2013 1127   CL 106 12/25/2017 0835   CL 110 (H) 09/14/2013 1127   CO2 26 12/25/2017 0835   CO2 29 09/14/2013 1127   GLUCOSE 101 (H) 12/25/2017 0835   GLUCOSE 106 (H) 09/14/2013 1127   BUN 9 12/25/2017 0835   BUN 8 09/14/2013 1127   CREATININE 0.60 12/25/2017 0835   CREATININE 0.71 09/14/2013 1127   CREATININE  0.68 04/01/2013 1550   CALCIUM 8.7 (L) 12/25/2017 0835   CALCIUM 8.6 09/14/2013 1127   PROT 6.7 12/25/2017 0835   PROT 7.6 09/14/2013 1127   ALBUMIN 3.5 12/25/2017 0835   ALBUMIN 3.2 (L) 09/14/2013 1127   AST 23 12/25/2017 0835   AST 22 09/14/2013 1127   ALT 12 12/25/2017 0835   ALT 19 09/14/2013 1127   ALKPHOS 68 12/25/2017 0835   ALKPHOS 76 09/14/2013 1127   BILITOT 0.6 12/25/2017 0835   BILITOT 0.4 09/14/2013 1127   GFRNONAA >60 12/25/2017 0835   GFRNONAA >60 09/14/2013 1127   GFRAA >60 12/25/2017 0835   GFRAA >60 09/14/2013 1127    No results found for: SPEP, UPEP  Lab Results  Component Value Date   WBC 7.8 12/25/2017   NEUTROABS 6.0 12/25/2017   HGB 13.0 12/25/2017   HCT 39.3 12/25/2017   MCV 89.9 12/25/2017   PLT 231 12/25/2017      Chemistry      Component Value Date/Time   NA 142 12/25/2017 0835   NA 142 09/14/2013 1127   K 3.5 12/25/2017 0835   K 3.5 09/14/2013 1127   CL 106 12/25/2017 0835   CL 110 (H) 09/14/2013 1127   CO2 26 12/25/2017 0835   CO2 29 09/14/2013 1127   BUN 9 12/25/2017 0835   BUN 8 09/14/2013 1127   CREATININE 0.60 12/25/2017 0835   CREATININE 0.71 09/14/2013 1127   CREATININE 0.68 04/01/2013 1550      Component Value Date/Time   CALCIUM 8.7 (L) 12/25/2017 0835   CALCIUM 8.6 09/14/2013 1127   ALKPHOS 68 12/25/2017 0835   ALKPHOS 76 09/14/2013 1127   AST 23 12/25/2017 0835   AST 22 09/14/2013 1127   ALT 12 12/25/2017 0835   ALT 19 09/14/2013 1127   BILITOT 0.6 12/25/2017 0835   BILITOT 0.4 09/14/2013 1127       RADIOGRAPHIC STUDIES: I have personally reviewed the radiological images as listed and agreed with the findings in the report. No results found.   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 however sub-pleural consolidation [see discussion  below]  #Patient clinically stable with regards to mantle cell lymphoma.  Recommend holding Rituxan at this time [especially given concern for pneumonia/discussed below]  #  Bil subpleural consolidation-NEW; -? Etiology- clinically pneumonia symptomatic with cough; START pt on levaquin 500 mg/day x10 days.  If symptoms do not improve I would recommend evaluation with pulmonary/bronchoscopy  As patient would be at high risk for atypical infections.  Get a chest x-ray in 2 weeks.  # Chronic arthritis- STABLE. okay with NSAIDs as needed.   # follow up in 2 weeks; CXR prior; on labs. Call us in 1 week if not improved.   # I reviewed the blood work- with the patient in detail; also reviewed the imaging independently [as summarized above]; and with the patient in detail.      Orders Placed This Encounter  Procedures  . DG Chest 2 View    Standing Status:   Future    Standing Expiration Date:   02/24/2019    Order Specific Question:   Reason for Exam (SYMPTOM  OR DIAGNOSIS REQUIRED)    Answer:   cough/pneumonia    Order Specific Question:   Preferred imaging location?    Answer:   [facility]   All questions were answered. The patient knows to call the clinic with any problems, questions or concerns.      Cammie Sickle, MD 12/25/2017 10:28 AM

## 2017-12-25 NOTE — Assessment & Plan Note (Addendum)
#  Mantle cell lymphoma stage IV-currently on maintenance Rituxan every 2 months [since May 2017]; July 2019- CT- NED; STABLE. however sub-pleural consolidation [see discussion below]  #Patient clinically stable with regards to mantle cell lymphoma.  Recommend holding Rituxan at this time [especially given concern for pneumonia/discussed below]  # Bil subpleural consolidation-NEW; -? Etiology- clinically pneumonia symptomatic with cough; START pt on levaquin 500 mg/day x10 days.  If symptoms do not improve I would recommend evaluation with pulmonary/bronchoscopy  As patient would be at high risk for atypical infections.  Get a chest x-ray in 2 weeks.  # Chronic arthritis- STABLE. okay with NSAIDs as needed.   # follow up in 2 weeks; CXR prior; on labs. Call us in 1 week if not improved.   # I reviewed the blood work- with the patient in detail; also reviewed the imaging independently [as summarized above]; and with the patient in detail.

## 2017-12-31 ENCOUNTER — Ambulatory Visit
Admission: RE | Admit: 2017-12-31 | Discharge: 2017-12-31 | Disposition: A | Discharge status: Home / Self Care | Payer: Medicare HMO | Source: Ambulatory Visit | Attending: Internal Medicine | Admitting: Internal Medicine

## 2017-12-31 ENCOUNTER — Other Ambulatory Visit: Payer: Self-pay | Admitting: *Deleted

## 2017-12-31 ENCOUNTER — Telehealth: Payer: Self-pay | Admitting: Internal Medicine

## 2017-12-31 DIAGNOSIS — R05 Cough: Secondary | ICD-10-CM | POA: Diagnosis not present

## 2017-12-31 DIAGNOSIS — R059 Cough, unspecified: Secondary | ICD-10-CM

## 2017-12-31 DIAGNOSIS — C8311 Mantle cell lymphoma, lymph nodes of head, face, and neck: Secondary | ICD-10-CM

## 2017-12-31 MED ORDER — HYDROCOD POLST-CPM POLST ER 10-8 MG/5ML PO SUER: 5.0000 mL | Freq: Two times a day (BID) | ORAL | 0 refills | Status: DC

## 2017-12-31 NOTE — Telephone Encounter (Signed)
Contacted patient. She went this morning for a chest xray. Reviewed cxr with patient   IMPRESSION: No active cardiopulmonary disease.  Patient gave verbal understanding of the test results. She states that her cough had much improved. She didn't realize she was taking that much Tussinex. I explained that the insurance may or may not approve for the renewal of Tussinex.  cxr within normal limits. Pt instructed to keep her apts as scheduled per md directions to RN.

## 2017-12-31 NOTE — Telephone Encounter (Signed)
Patient called asking for a refill of her Tussionex. She was given #140 ml with instructions to take 5 ml at hs as needed. This should be 28 doses, or 28 days  That she has used in 6 days. She reportst hat she was not given any measuring cup with the prescription so she was using a cup off another cough medicine to take her medicine. She said she needs it to calm her cough. I explained to her that she was taking too much and that 5 ml is a teaspoon, not a tablespoon. Please advise.  I called pharmacy and confirmed that she was dispensed #140 ml

## 2017-12-31 NOTE — Telephone Encounter (Signed)
Heather- please speak to patient how she is feeling cough improved.  Please let me know if she is feeling significantly worse.  Recommend a chest x-ray ASAP.  I am going to refill her Tussionex for now [changed to BID].  Thx

## 2017-12-31 NOTE — Telephone Encounter (Signed)
See phone note from RN

## 2018-01-11 ENCOUNTER — Inpatient Hospital Stay: Payer: Medicare HMO | Attending: Internal Medicine | Admitting: Internal Medicine

## 2018-01-11 ENCOUNTER — Encounter: Payer: Self-pay | Admitting: Internal Medicine

## 2018-01-11 ENCOUNTER — Other Ambulatory Visit: Payer: Self-pay

## 2018-01-11 DIAGNOSIS — R0602 Shortness of breath: Secondary | ICD-10-CM | POA: Insufficient documentation

## 2018-01-11 DIAGNOSIS — M1380 Other specified arthritis, unspecified site: Secondary | ICD-10-CM | POA: Diagnosis not present

## 2018-01-11 DIAGNOSIS — C8311 Mantle cell lymphoma, lymph nodes of head, face, and neck: Secondary | ICD-10-CM

## 2018-01-11 DIAGNOSIS — R05 Cough: Secondary | ICD-10-CM | POA: Diagnosis not present

## 2018-01-11 NOTE — Progress Notes (Signed)
[facility] OFFICE PROGRESS NOTE  Patient Care Team: Burnard Hawthorne, FNP as PCP - General (Family Medicine) Jackolyn Confer, MD (Internal Medicine)  Cancer Staging No matching staging information was found for the patient.   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 Feb 9th- START Benda-Ritux with neulasta; Prolonged neutropenia; DISCONT- Benda-Ritux;   # April 13 th 2017- START R-CHOP x1; severe/prolonged neutropenia; PET- CR; BMBx-Neg; Disc R-CHOP  # 26th MAY 2017- Start Rituxan q 26M Main OCT 12th 2017- PET NED.   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  female pleasant patient above history of mantle cell lymphoma most recently on Rituxan maintenance is here for follow-up.  In the interim patient's maintenance Rituxan was interrupted because of recent pneumonia.  Patient treated with Levaquin.  Patient continues to have mild cough; however significantly improved.  Shortness of breath is improved not resolved.  Her appetite is good.  No weight loss.  No nausea no vomiting.  No fevers or chills or weight loss or lung problems.  Review of Systems  Constitutional: Negative for chills, diaphoresis, fever, malaise/fatigue and weight loss.  HENT: Negative for nosebleeds and sore throat.   Eyes: Negative for double vision.  Respiratory: Positive for cough and shortness of breath. Negative for hemoptysis, sputum production and wheezing.   Cardiovascular: Negative  for chest pain, palpitations, orthopnea and leg swelling.  Gastrointestinal: Negative for abdominal pain, blood in stool, constipation, diarrhea, heartburn, melena, nausea and vomiting.  Genitourinary: Negative for dysuria, frequency and urgency.  Musculoskeletal: Negative for back pain and joint pain.  Skin: Negative.  Negative for itching and rash.  Neurological: Negative for dizziness, tingling, focal weakness, weakness and headaches.  Endo/Heme/Allergies: Does not bruise/bleed easily.  Psychiatric/Behavioral: Negative for depression. The patient is not nervous/anxious and does not have insomnia.       PAST MEDICAL HISTORY :  Past Medical History:  Diagnosis Date  . Arthritis   . Collagen vascular disease ([place])   . GERD (gastroesophageal reflux disease)   . Headache(784.0)   . History of methotrexate therapy   . Hyperlipidemia    hx  . Lymphadenopathy of head and neck 01/2015   see on Thyroid ultrasound  . Lymphoma, mantle cell ([place]) 06/01/2015   bx of lymph node in right breast/Stage IV Mantle Cell Lymphoma  . Rheumatoid arthritis ([place])     PAST SURGICAL HISTORY :   Past Surgical History:  Procedure Laterality Date  . CARDIAC CATHETERIZATION  09/2004   [facility]; EF 60%  . CARDIAC CATHETERIZATION  08/2004   [facility]  . PERIPHERAL VASCULAR CATHETERIZATION N/A 07/04/2015   Procedure: [name] Insertion;  Surgeon: [name], MD;  Location: [facility];  Service: Cardiovascular;  Laterality: N/A;    FAMILY HISTORY :   Family History  Problem Relation Age of Onset  . Diabetes Mother   . Cholelithiasis Mother   . Hypertension Sister   . Diabetes Sister   .  Arthritis Brother   . Breast cancer Neg Hx     SOCIAL HISTORY:   Social History   Tobacco Use  . Smoking status: Never Smoker  . Smokeless tobacco: Never Used  Substance Use Topics  . Alcohol use: No  . Drug use: No    ALLERGIES:  has No Known Allergies.  MEDICATIONS:  Current Outpatient Medications   Medication Sig Dispense Refill  . acetaminophen (TYLENOL) 500 MG tablet Take 500 mg by mouth every 6 (six) hours as needed.    . Calcium Carbonate-Vit D-Min (CALTRATE 600+D PLUS) 600-800 MG-UNIT CHEW Chew 1 tablet by mouth 2 (two) times daily. 60 tablet 6  . cyanocobalamin 500 MCG tablet Take 500 mcg by mouth daily. Take two gummies daily    . fluticasone (FLONASE) 50 MCG/ACT nasal spray SPRAY TWICE IN EACH NOSTRIL ONCE DAILY  4  . Multiple Vitamins-Minerals (MULTIVITAMIN GUMMIES ADULT) CHEW Chew by mouth. Reported on 09/06/2015    . Omega-3 1000 MG CAPS Take by mouth.    [name] PREVIDENT 0.2 % SOLN RINSE AFTER THOROUGH BRUSHING  10  . lidocaine-prilocaine (EMLA) cream Apply 1 application topically as needed. (Patient not taking: Reported on 01/11/2018) 30 g 11  . meclizine (ANTIVERT) 25 MG tablet Take 25 mg by mouth 3 (three) times daily as needed for dizziness.    . ondansetron (ZOFRAN) 8 MG tablet Take 1 tablet (8 mg total) by mouth every 8 (eight) hours as needed for nausea or vomiting (start 3 days; after chemo). (Patient not taking: Reported on 01/11/2018) 40 tablet 0  . prochlorperazine (COMPAZINE) 10 MG tablet Take 1 tablet (10 mg total) by mouth every 6 (six) hours as needed for nausea or vomiting. (Patient not taking: Reported on 01/11/2018) 30 tablet 0  . VENTOLIN HFA 108 (90 Base) MCG/ACT inhaler TAKE 2 PUFFS BY MOUTH EVERY 6 HOURS AS NEEDED FOR WHEEZE OR SHORTNESS OF BREATH (Patient not taking: Reported on 01/11/2018) 18 Inhaler 5   No current facility-administered medications for this visit.    Facility-Administered Medications Ordered in Other Visits  Medication Dose Route Frequency Provider Last Rate Last Dose  . sodium chloride flush (NS) 0.9 % injection 10 mL  10 mL Intravenous Once Cammie Sickle, MD      . Tbo-Filgrastim [facility]) injection 480 mcg  480 mcg Subcutaneous Once Cammie Sickle, MD        PHYSICAL EXAMINATION: ECOG PERFORMANCE STATUS: 0 - Asymptomatic  BP  138/81 (BP Location: Right Arm, Patient Position: Sitting)   Pulse 75   Temp 97.6 F (36.4 C) (Tympanic)   Resp 18   Ht 5' 5.5" (1.664 m)   Wt 250 lb (113.4 kg)   BMI 40.97 kg/m   Filed Weights   01/11/18 1404  Weight: 250 lb (113.4 kg)    GENERAL: Well-nourished well-developed; Alert, no distress and comfortable.  Accompanied by family. EYES: no pallor or icterus OROPHARYNX: no thrush or ulceration; NECK: supple; no lymph nodes felt. LYMPH:  no palpable lymphadenopathy in the axillary or inguinal regions LUNGS: Decreased breath sounds auscultation bilaterally. No wheeze or crackles HEART/CVS: regular rate & rhythm and no murmurs; No lower extremity edema ABDOMEN:abdomen soft, non-tender and normal bowel sounds. No hepatomegaly or splenomegaly.  Musculoskeletal:no cyanosis of digits and no clubbing  PSYCH: alert & oriented x 3 with fluent speech NEURO: no focal motor/sensory deficits SKIN:  no rashes or significant lesions    LABORATORY DATA:  I have reviewed the data as listed  Component Value Date/Time   NA 142 12/25/2017 0835   NA 142 09/14/2013 1127   K 3.5 12/25/2017 0835   K 3.5 09/14/2013 1127   CL 106 12/25/2017 0835   CL 110 (H) 09/14/2013 1127   CO2 26 12/25/2017 0835   CO2 29 09/14/2013 1127   GLUCOSE 101 (H) 12/25/2017 0835   GLUCOSE 106 (H) 09/14/2013 1127   BUN 9 12/25/2017 0835   BUN 8 09/14/2013 1127   CREATININE 0.60 12/25/2017 0835   CREATININE 0.71 09/14/2013 1127   CREATININE 0.68 04/01/2013 1550   CALCIUM 8.7 (L) 12/25/2017 0835   CALCIUM 8.6 09/14/2013 1127   PROT 6.7 12/25/2017 0835   PROT 7.6 09/14/2013 1127   ALBUMIN 3.5 12/25/2017 0835   ALBUMIN 3.2 (L) 09/14/2013 1127   AST 23 12/25/2017 0835   AST 22 09/14/2013 1127   ALT 12 12/25/2017 0835   ALT 19 09/14/2013 1127   ALKPHOS 68 12/25/2017 0835   ALKPHOS 76 09/14/2013 1127   BILITOT 0.6 12/25/2017 0835   BILITOT 0.4 09/14/2013 1127   GFRNONAA >60 12/25/2017 0835   GFRNONAA  >60 09/14/2013 1127   GFRAA >60 12/25/2017 0835   GFRAA >60 09/14/2013 1127    No results found for: SPEP, UPEP  Lab Results  Component Value Date   WBC 7.8 12/25/2017   NEUTROABS 6.0 12/25/2017   HGB 13.0 12/25/2017   HCT 39.3 12/25/2017   MCV 89.9 12/25/2017   PLT 231 12/25/2017      Chemistry      Component Value Date/Time   NA 142 12/25/2017 0835   NA 142 09/14/2013 1127   K 3.5 12/25/2017 0835   K 3.5 09/14/2013 1127   CL 106 12/25/2017 0835   CL 110 (H) 09/14/2013 1127   CO2 26 12/25/2017 0835   CO2 29 09/14/2013 1127   BUN 9 12/25/2017 0835   BUN 8 09/14/2013 1127   CREATININE 0.60 12/25/2017 0835   CREATININE 0.71 09/14/2013 1127   CREATININE 0.68 04/01/2013 1550      Component Value Date/Time   CALCIUM 8.7 (L) 12/25/2017 0835   CALCIUM 8.6 09/14/2013 1127   ALKPHOS 68 12/25/2017 0835   ALKPHOS 76 09/14/2013 1127   AST 23 12/25/2017 0835   AST 22 09/14/2013 1127   ALT 12 12/25/2017 0835   ALT 19 09/14/2013 1127   BILITOT 0.6 12/25/2017 0835   BILITOT 0.4 09/14/2013 1127       RADIOGRAPHIC STUDIES: I have personally reviewed the radiological images as listed and agreed with the findings in the report. No results found.   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 however sub-pleural consolidation [see discussion below].  #Patient clinically stable with regards to mantle cell lymphoma.  Recommend holding Rituxan at this time [especially given concern for pneumonia/discussed below]  #Left lower lobe pneumonia s/p Levaquin- resolved.  Improved.  Reviewed the chest x-ray.  # Chronic arthritis-okay with NSAIDs as needed.  Stable  # labs/port flush/ MD in 1 month.     Orders Placed This Encounter  Procedures  . CBC with Differential    Standing Status:   Future    Standing  Expiration Date:   01/12/2019  . Comprehensive metabolic panel    Standing Status:   Future    Standing Expiration Date:   01/12/2019  . Lactate  dehydrogenase    Standing Status:   Future    Standing Expiration Date:   01/12/2019   All questions were answered. The patient knows to call the clinic with any problems, questions or concerns.      Cammie Sickle, MD 01/25/2018 7:48 PM

## 2018-01-11 NOTE — Assessment & Plan Note (Addendum)
#  Mantle cell lymphoma stage IV-currently on maintenance Rituxan every 2 months [since May 2017]; July 2019- CT- NED; stable. however sub-pleural consolidation [see discussion below].  #Patient clinically stable with regards to mantle cell lymphoma.  Recommend holding Rituxan at this time [especially given concern for pneumonia/discussed below]  #Left lower lobe pneumonia s/p Levaquin- resolved.  Improved.  Reviewed the chest x-ray.  # Chronic arthritis-okay with NSAIDs as needed.  Stable  # labs/port flush/ MD in 1 month.

## 2018-02-08 ENCOUNTER — Ambulatory Visit: Payer: Self-pay | Admitting: Internal Medicine

## 2018-02-08 ENCOUNTER — Other Ambulatory Visit: Payer: Self-pay

## 2018-02-12 ENCOUNTER — Other Ambulatory Visit: Payer: Self-pay

## 2018-02-12 ENCOUNTER — Inpatient Hospital Stay (HOSPITAL_BASED_OUTPATIENT_CLINIC_OR_DEPARTMENT_OTHER): Payer: Medicare HMO | Admitting: Internal Medicine

## 2018-02-12 ENCOUNTER — Inpatient Hospital Stay: Payer: Medicare HMO | Attending: Internal Medicine

## 2018-02-12 VITALS — BP 125/67 | HR 69 | Temp 93.2°F | Resp 18 | Wt 249.6 lb

## 2018-02-12 DIAGNOSIS — C8311 Mantle cell lymphoma, lymph nodes of head, face, and neck: Secondary | ICD-10-CM

## 2018-02-12 LAB — CBC WITH DIFFERENTIAL/PLATELET
Basophils Absolute: 0 10*3/uL (ref 0–0.1)
Basophils Relative: 0 %
Eosinophils Absolute: 0.1 10*3/uL (ref 0–0.7)
Eosinophils Relative: 2 %
HCT: 40.6 % (ref 35.0–47.0)
Hemoglobin: 13.4 g/dL (ref 12.0–16.0)
Lymphocytes Relative: 15 %
Lymphs Abs: 0.9 10*3/uL — ABNORMAL LOW (ref 1.0–3.6)
MCH: 29.6 pg (ref 26.0–34.0)
MCHC: 32.9 g/dL (ref 32.0–36.0)
MCV: 90.1 fL (ref 80.0–100.0)
Monocytes Absolute: 0.4 10*3/uL (ref 0.2–0.9)
Monocytes Relative: 6 %
Neutro Abs: 4.7 10*3/uL (ref 1.4–6.5)
Neutrophils Relative %: 77 %
Platelets: 194 10*3/uL (ref 150–440)
RBC: 4.51 MIL/uL (ref 3.80–5.20)
RDW: 15.7 % — ABNORMAL HIGH (ref 11.5–14.5)
WBC: 6.1 10*3/uL (ref 3.6–11.0)

## 2018-02-12 LAB — COMPREHENSIVE METABOLIC PANEL
ALT: 14 U/L (ref 0–44)
AST: 23 U/L (ref 15–41)
Albumin: 4 g/dL (ref 3.5–5.0)
Alkaline Phosphatase: 70 U/L (ref 38–126)
Anion gap: 5 (ref 5–15)
BUN: 17 mg/dL (ref 8–23)
CO2: 28 mmol/L (ref 22–32)
Calcium: 9.2 mg/dL (ref 8.9–10.3)
Chloride: 106 mmol/L (ref 98–111)
Creatinine, Ser: 0.68 mg/dL (ref 0.44–1.00)
GFR calc Af Amer: >60 mL/min (ref >60)
GFR calc non Af Amer: >60 mL/min (ref >60)
Glucose, Bld: 95 mg/dL (ref 70–99)
Potassium: 4.1 mmol/L (ref 3.5–5.1)
Sodium: 139 mmol/L (ref 135–145)
Total Bilirubin: 0.7 mg/dL (ref 0.3–1.2)
Total Protein: 6.8 g/dL (ref 6.5–8.1)

## 2018-02-12 LAB — LACTATE DEHYDROGENASE: LDH: 179 U/L (ref 98–192)

## 2018-02-12 MED ORDER — SODIUM CHLORIDE 0.9% FLUSH
10.0000 mL | Freq: Once | INTRAVENOUS | Status: AC
  Administered 2018-02-12: 10 mL via INTRAVENOUS
  Filled 2018-02-12: qty 10

## 2018-02-12 MED ORDER — HEPARIN SOD (PORK) LOCK FLUSH 100 UNIT/ML IV SOLN
500.0000 [IU] | Freq: Once | INTRAVENOUS | Status: AC
  Administered 2018-02-12: 500 [IU] via INTRAVENOUS

## 2018-02-12 NOTE — Assessment & Plan Note (Addendum)
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 STABLE.  #With regards to mantle cell lymphoma clinically stable; recommend discontinuation of maintenance Rituxan given the pneumonias.  Will again repeat a scan in approximately 4 to 6 months if progressive disease noted ibrutinib is reasonable option.  #Left lower lobe pneumonia s/p Levaquin-improved.  # Chronic arthritis-okay with NSAIDs as needed.  Improved.  # follow up in 2 months/labs- Dr.B

## 2018-02-12 NOTE — Progress Notes (Signed)
[place] OFFICE PROGRESS NOTE  Patient Care Team: Burnard Hawthorne, FNP as PCP - General (Family Medicine) Jackolyn Confer, MD (Internal Medicine)  Cancer Staging No matching staging information was found for the patient.   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 Feb 9th- START Benda-Ritux with neulasta; Prolonged neutropenia; DISCONT- Benda-Ritux;   # April 13 th 2017- START R-CHOP x1; severe/prolonged neutropenia; PET- CR; BMBx-Neg; Disc R-CHOP  # 26th MAY 2017- Start Rituxan q 62M Main OCT 12th 2017- PET NED.  Stop Rituxan maintenance x 2 years [aug 2019-pneumonia]  # Rheumatoid Arthritis [on MXT]; March 2017-MUGA scan-51 % --------------------------------------------------------    DIAGNOSIS: [jan 2017 ] Mantle cell lymphoma  STAGE: 4        ;GOALS: Control  CURRENT/MOST RECENT THERAPY [ may 2017] maintenance Rituxan      Mantle cell lymphoma of lymph nodes of head, face, and neck ([facility])      INTERVAL HISTORY:  Michelle [name] 77 y.o.  female pleasant patient above history of mantle cell lymphoma most recently on Rituxan maintenance is here for follow-up.  Patient had a recent episode of pneumonia; treated with antibiotics.  Patient currently feeling better.  Denies any cough.  Denies any shortness of breath.  No chest pain no new lumps or bumps.  She feels back to baseline.  Review of Systems  Constitutional: Negative for chills, diaphoresis, fever, malaise/fatigue and weight loss.  HENT: Negative for nosebleeds and sore throat.   Eyes: Negative for double vision.  Respiratory: Negative for hemoptysis, sputum production and wheezing.   Cardiovascular: Negative for chest pain, palpitations, orthopnea and leg swelling.  Gastrointestinal: Negative for abdominal pain, blood in stool,  constipation, diarrhea, heartburn, melena, nausea and vomiting.  Genitourinary: Negative for dysuria, frequency and urgency.  Musculoskeletal: Negative for back pain and joint pain.  Skin: Negative.  Negative for itching and rash.  Neurological: Negative for dizziness, tingling, focal weakness, weakness and headaches.  Endo/Heme/Allergies: Does not bruise/bleed easily.  Psychiatric/Behavioral: Negative for depression. The patient is not nervous/anxious and does not have insomnia.       PAST MEDICAL HISTORY :  Past Medical History:  Diagnosis Date  . Arthritis   . Collagen vascular disease ([place])   . GERD (gastroesophageal reflux disease)   . Headache(784.0)   . History of methotrexate therapy   . Hyperlipidemia    hx  . Lymphadenopathy of head and neck 01/2015   see on Thyroid ultrasound  . Lymphoma, mantle cell ([place]) 06/01/2015   bx of lymph node in right breast/Stage IV Mantle Cell Lymphoma  . Rheumatoid arthritis ([place])     PAST SURGICAL HISTORY :   Past Surgical History:  Procedure Laterality Date  . CARDIAC CATHETERIZATION  09/2004   [facility]; EF 60%  . CARDIAC CATHETERIZATION  08/2004   [facility]  . PERIPHERAL VASCULAR CATHETERIZATION N/A 07/04/2015   Procedure: [name] Insertion;  Surgeon: [name], MD;  Location: [facility];  Service: Cardiovascular;  Laterality: N/A;    FAMILY HISTORY :   Family History  Problem Relation Age of Onset  . Diabetes Mother   . Cholelithiasis Mother   . Hypertension Sister   . Diabetes Sister   . Arthritis Brother   . Breast cancer Neg Hx     SOCIAL HISTORY:   Social History  Tobacco Use  . Smoking status: Never Smoker  . Smokeless tobacco: Never Used  Substance Use Topics  . Alcohol use: No  . Drug use: No    ALLERGIES:  has No Known Allergies.  MEDICATIONS:  Current Outpatient Medications  Medication Sig Dispense Refill  . Calcium Carbonate-Vit D-Min (CALTRATE 600+D PLUS) 600-800 MG-UNIT CHEW Chew 1 tablet by  mouth 2 (two) times daily. 60 tablet 6  . cyanocobalamin 500 MCG tablet Take 500 mcg by mouth daily. Take two gummies daily    . fluticasone (FLONASE) 50 MCG/ACT nasal spray SPRAY TWICE IN EACH NOSTRIL ONCE DAILY  4  . Multiple Vitamins-Minerals (MULTIVITAMIN GUMMIES ADULT) CHEW Chew by mouth. Reported on 09/06/2015    . Omega-3 1000 MG CAPS Take by mouth.    [name] acetaminophen (TYLENOL) 500 MG tablet Take 500 mg by mouth every 6 (six) hours as needed.    . lidocaine-prilocaine (EMLA) cream Apply 1 application topically as needed. (Patient not taking: Reported on 02/12/2018) 30 g 11  . meclizine (ANTIVERT) 25 MG tablet Take 25 mg by mouth 3 (three) times daily as needed for dizziness.    . ondansetron (ZOFRAN) 8 MG tablet Take 1 tablet (8 mg total) by mouth every 8 (eight) hours as needed for nausea or vomiting (start 3 days; after chemo). (Patient not taking: Reported on 02/12/2018) 40 tablet 0  . PREVIDENT 0.2 % SOLN RINSE AFTER THOROUGH BRUSHING  10  . prochlorperazine (COMPAZINE) 10 MG tablet Take 1 tablet (10 mg total) by mouth every 6 (six) hours as needed for nausea or vomiting. (Patient not taking: Reported on 02/12/2018) 30 tablet 0  . VENTOLIN HFA 108 (90 Base) MCG/ACT inhaler TAKE 2 PUFFS BY MOUTH EVERY 6 HOURS AS NEEDED FOR WHEEZE OR SHORTNESS OF BREATH (Patient not taking: Reported on 02/12/2018) 18 Inhaler 5   No current facility-administered medications for this visit.    Facility-Administered Medications Ordered in Other Visits  Medication Dose Route Frequency Provider Last Rate Last Dose  . sodium chloride flush (NS) 0.9 % injection 10 mL  10 mL Intravenous Once Cammie Sickle, MD      . Tbo-Filgrastim [facility]) injection 480 mcg  480 mcg Subcutaneous Once Cammie Sickle, MD        PHYSICAL EXAMINATION: ECOG PERFORMANCE STATUS: 0 - Asymptomatic  BP 125/67 (BP Location: Left Arm, Patient Position: Sitting)   Pulse 69   Temp (!) 93.2 F (34 C) (Tympanic)   Resp 18   Wt  249 lb 9.6 oz (113.2 kg)   BMI 40.90 kg/m   Filed Weights   02/12/18 1349  Weight: 249 lb 9.6 oz (113.2 kg)    Physical Exam  Constitutional: She is oriented to person, place, and time and well-developed, well-nourished, and in no distress.  Obese.  Accompanied by her husband.  Walking by self.  HENT:  Head: Normocephalic and atraumatic.  Mouth/Throat: Oropharynx is clear and moist. No oropharyngeal exudate.  Eyes: Pupils are equal, round, and reactive to light.  Neck: Normal range of motion. Neck supple.  Cardiovascular: Normal rate and regular rhythm.  Pulmonary/Chest: No respiratory distress. She has no wheezes.  Abdominal: Soft. Bowel sounds are normal. She exhibits no distension and no mass. There is no tenderness. There is no rebound and no guarding.  Musculoskeletal: Normal range of motion. She exhibits no edema or tenderness.  Neurological: She is alert and oriented to person, place, and time.  Skin: Skin is warm.  Psychiatric: Affect normal.  LABORATORY DATA:  I have reviewed the data as listed    Component Value Date/Time   NA 139 02/12/2018 1319   NA 142 09/14/2013 1127   K 4.1 02/12/2018 1319   K 3.5 09/14/2013 1127   CL 106 02/12/2018 1319   CL 110 (H) 09/14/2013 1127   CO2 28 02/12/2018 1319   CO2 29 09/14/2013 1127   GLUCOSE 95 02/12/2018 1319   GLUCOSE 106 (H) 09/14/2013 1127   BUN 17 02/12/2018 1319   BUN 8 09/14/2013 1127   CREATININE 0.68 02/12/2018 1319   CREATININE 0.71 09/14/2013 1127   CREATININE 0.68 04/01/2013 1550   CALCIUM 9.2 02/12/2018 1319   CALCIUM 8.6 09/14/2013 1127   PROT 6.8 02/12/2018 1319   PROT 7.6 09/14/2013 1127   ALBUMIN 4.0 02/12/2018 1319   ALBUMIN 3.2 (L) 09/14/2013 1127   AST 23 02/12/2018 1319   AST 22 09/14/2013 1127   ALT 14 02/12/2018 1319   ALT 19 09/14/2013 1127   ALKPHOS 70 02/12/2018 1319   ALKPHOS 76 09/14/2013 1127   BILITOT 0.7 02/12/2018 1319   BILITOT 0.4 09/14/2013 1127   GFRNONAA >60  02/12/2018 1319   GFRNONAA >60 09/14/2013 1127   GFRAA >60 02/12/2018 1319   GFRAA >60 09/14/2013 1127    No results found for: SPEP, UPEP  Lab Results  Component Value Date   WBC 6.1 02/12/2018   NEUTROABS 4.7 02/12/2018   HGB 13.4 02/12/2018   HCT 40.6 02/12/2018   MCV 90.1 02/12/2018   PLT 194 02/12/2018      Chemistry      Component Value Date/Time   NA 139 02/12/2018 1319   NA 142 09/14/2013 1127   K 4.1 02/12/2018 1319   K 3.5 09/14/2013 1127   CL 106 02/12/2018 1319   CL 110 (H) 09/14/2013 1127   CO2 28 02/12/2018 1319   CO2 29 09/14/2013 1127   BUN 17 02/12/2018 1319   BUN 8 09/14/2013 1127   CREATININE 0.68 02/12/2018 1319   CREATININE 0.71 09/14/2013 1127   CREATININE 0.68 04/01/2013 1550      Component Value Date/Time   CALCIUM 9.2 02/12/2018 1319   CALCIUM 8.6 09/14/2013 1127   ALKPHOS 70 02/12/2018 1319   ALKPHOS 76 09/14/2013 1127   AST 23 02/12/2018 1319   AST 22 09/14/2013 1127   ALT 14 02/12/2018 1319   ALT 19 09/14/2013 1127   BILITOT 0.7 02/12/2018 1319   BILITOT 0.4 09/14/2013 1127       RADIOGRAPHIC STUDIES: I have personally reviewed the radiological images as listed and agreed with the findings in the report. No results found.   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 STABLE.  #With regards to mantle cell lymphoma clinically stable; recommend discontinuation of maintenance Rituxan given the pneumonias.  Will again repeat a scan in approximately 4 to 6 months if progressive disease noted ibrutinib is reasonable option.  #Left lower lobe pneumonia s/p Levaquin-improved.  # Chronic arthritis-okay with NSAIDs as needed.  Improved.  # follow up in 2 months/labs- Dr.B     No orders of the defined types were placed  in this encounter.  All questions were answered. The patient knows to call the clinic with any problems, questions or concerns.      Cammie Sickle,  MD 02/12/2018 5:13 PM

## 2018-02-12 NOTE — Progress Notes (Signed)
Here for follow up. Per pt " feeling good " -c/o left sided neck pain x 1 wk today- dull ache per pt

## 2018-02-19 ENCOUNTER — Ambulatory Visit (INDEPENDENT_AMBULATORY_CARE_PROVIDER_SITE_OTHER): Payer: Medicare HMO | Admitting: *Deleted

## 2018-02-19 DIAGNOSIS — Z23 Encounter for immunization: Secondary | ICD-10-CM

## 2018-03-05 ENCOUNTER — Other Ambulatory Visit: Payer: Self-pay | Admitting: Family

## 2018-03-05 DIAGNOSIS — Z1231 Encounter for screening mammogram for malignant neoplasm of breast: Secondary | ICD-10-CM

## 2018-03-12 ENCOUNTER — Encounter: Payer: Self-pay | Admitting: Oncology

## 2018-03-12 ENCOUNTER — Inpatient Hospital Stay: Payer: Medicare HMO

## 2018-03-12 ENCOUNTER — Ambulatory Visit
Admission: RE | Admit: 2018-03-12 | Discharge: 2018-03-12 | Disposition: A | Discharge status: Home / Self Care | Payer: Medicare HMO | Source: Ambulatory Visit | Attending: Oncology | Admitting: Oncology

## 2018-03-12 ENCOUNTER — Inpatient Hospital Stay: Payer: Medicare HMO | Attending: Oncology | Admitting: Oncology

## 2018-03-12 VITALS — BP 117/73 | HR 88 | Temp 97.2°F | Resp 20 | Wt 252.0 lb

## 2018-03-12 DIAGNOSIS — R05 Cough: Secondary | ICD-10-CM | POA: Insufficient documentation

## 2018-03-12 DIAGNOSIS — R062 Wheezing: Secondary | ICD-10-CM

## 2018-03-12 DIAGNOSIS — C8311 Mantle cell lymphoma, lymph nodes of head, face, and neck: Secondary | ICD-10-CM | POA: Diagnosis not present

## 2018-03-12 DIAGNOSIS — R059 Cough, unspecified: Secondary | ICD-10-CM

## 2018-03-12 DIAGNOSIS — R0602 Shortness of breath: Secondary | ICD-10-CM | POA: Diagnosis not present

## 2018-03-12 MED ORDER — PREDNISONE 10 MG (21) PO TBPK: ORAL_TABLET | ORAL | 0 refills | Status: DC

## 2018-03-12 MED ORDER — IPRATROPIUM-ALBUTEROL 0.5-2.5 (3) MG/3ML IN SOLN
3.0000 mL | Freq: Once | RESPIRATORY_TRACT | Status: AC
  Administered 2018-03-12: 3 mL via RESPIRATORY_TRACT
  Filled 2018-03-12: qty 3

## 2018-03-12 MED ORDER — HYDROCOD POLST-CPM POLST ER 10-8 MG/5ML PO SUER: 5.0000 mL | Freq: Two times a day (BID) | ORAL | 0 refills | Status: DC | PRN

## 2018-03-12 NOTE — Progress Notes (Signed)
Symptom Management Consult note [facility]  [phone] [phone]  Patient Care Team: Burnard Hawthorne, FNP as PCP - General (Family Medicine) Jackolyn Confer, MD (Internal Medicine)   Name of the patient: Michelle [name]  [id number]  Jul 10, 1940   Date of visit: 03/12/2018  Diagnosis: Mantle cell lymphoma  Chief Complaint: Cough   Current Treatment: Maintenance Rituxin. Last 10/30/17.   Oncology History: Patient was last seen by primary oncologist Dr. Rogue Bussing on 02/12/2018 for maintenance follow-up.  Previously on Rituxan.  At that visit, she had recently recovered from pneumonia and was treated with Levaquin.  She felt back to baseline.  Most recent imaging was from July 2019 that did not reveal any evidence of disease.  Given recurrent infections, Dr. Rogue Bussing recommended discontinuing Rituxan and plan was to reimage in 4 to 6 months, to be scheduled at next visit .  If progression noted ibrutinib was a reasonable option.   She was seen and evaluated by PCP in June 2019 for similar complaints.  Chest x-ray revealed minimal bronchitic changes.  She was treated with cough medicine, Doxycycline and albuterol inhaler with resolution.   Had restaging CT scan completed in July which revealed stable disease but showed possible respiratory infection. She was treated with Levaquin for Pneumonia. Symptoms resolved.   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 Feb 9th- START Benda-Ritux with neulasta; Prolonged neutropenia; DISCONT- Benda-Ritux;   # April 13 th 2017- START R-CHOP x1; severe/prolonged neutropenia; PET- CR; BMBx-Neg; Disc R-CHOP  # 26th MAY 2017- Start Rituxan q 47M Main OCT 12th 2017- PET NED.  Stop Rituxan maintenance x 2 years [aug 2019-pneumonia]  # Rheumatoid  Arthritis [on MXT]; March 2017-MUGA scan-51 % --------------------------------------------------------    DIAGNOSIS: [jan 2017 ] Mantle cell lymphoma  STAGE: 4        ;GOALS: Control  CURRENT/MOST RECENT THERAPY [ may 2017] maintenance Rituxan      Mantle cell lymphoma of lymph nodes of head, face, and neck (HCC)    Subjective Data:  Subjective:     Michelle [name] is a 77 y.o. female who presents for evaluation of dyspnea, nonproductive cough and wheezing. Symptoms began 2 weeks ago. Symptoms have been gradually worsening since that time. Past history is significant for chronic bronchitis and pneumonia. Has tried OTC cough medicine without help. Has not tried her albuterol inhaler or mucinex. Dr. Rogue Bussing asked her to call clinic if she developed similar symptoms to when she was diagnosed with pneumonia.   The following portions of the patient's history were reviewed and updated as appropriate: allergies, current medications, past family history, past medical history, past social history, past surgical history and problem list.  Review of Systems A comprehensive review of systems was negative except for: Respiratory: positive for cough and wheezing    Objective:    BP 117/73 (BP Location: Left Arm, Patient Position: Sitting)   Pulse 88   Temp (!) 97.2 F (36.2 C) (Tympanic)   Resp 20   Wt 252 lb (114.3 kg)   SpO2 98%   BMI 41.30 kg/m  General appearance: alert and no distress Lungs: wheezes LLL and RLL Heart: regular rate and rhythm, S1, S2 normal, no murmur, click, rub or gallop    Assessment:    Acute Bronchitis verse pneumonia verse URI.   Plan:  Worsening signs and symptoms discussed. Rest, fluids, acetaminophen, and humidification. Follow up as needed for persistent, worsening cough, or appearance of new symptoms.    Mantle cell lymphoma: s/p maintenance Rituxan.  Last given in May 2019.  Per Dr. Aletha Halim progress note, patient discontinued Rituxan  d/t recurrent respiratory infections.  Plan is to see her back in December or January with reimaging.  If progression is noted, it is likely they will start ibrutinib at that time.  She is scheduled for follow-up with Dr. Rogue Bussing in November 2019 with labs and assessment.  They will schedule imaging at that visit.  Cough/wheezing: Will give DuoNeb nebulizer in clinic today.  Oxygen saturations are 98% on room air.  She is afebrile.  Blood pressure stable.  Scattered wheezing heard with expiration.  Patient coughing throughout entire visit.  Nonproductive.  Will get stat chest x-ray to rule out infection.  This is unlikely given she is afebrile. Chest X-ray:  No acute abnormality noted.   Instructed to use albuterol inhaler every 6 hours for wheezing PRN.  RX Tussionex twice daily for cough.  Cough is interfering with sleep RX Prednisone Taper tablet pack for 6 days.   If she develops a fever or worsening symptoms she is encouraged to call back to clinic or report to the emergency room for further evaluation.   Greater than 50% was spent in counseling and coordination of care with this patient including but not limited to discussion of the relevant topics above (See A&P) including, but not limited to diagnosis and management of acute and chronic medical conditions.   [name], NP 03/12/2018 11:36 AM

## 2018-03-19 ENCOUNTER — Ambulatory Visit
Admission: RE | Admit: 2018-03-19 | Discharge: 2018-03-19 | Disposition: A | Discharge status: Home / Self Care | Payer: Medicare HMO | Source: Ambulatory Visit | Attending: Family | Admitting: Family

## 2018-03-19 DIAGNOSIS — Z1231 Encounter for screening mammogram for malignant neoplasm of breast: Secondary | ICD-10-CM | POA: Insufficient documentation

## 2018-03-21 ENCOUNTER — Other Ambulatory Visit: Payer: Self-pay | Admitting: Family

## 2018-03-21 DIAGNOSIS — E785 Hyperlipidemia, unspecified: Secondary | ICD-10-CM

## 2018-03-24 ENCOUNTER — Other Ambulatory Visit: Payer: Self-pay | Admitting: Family

## 2018-03-24 DIAGNOSIS — R928 Other abnormal and inconclusive findings on diagnostic imaging of breast: Secondary | ICD-10-CM

## 2018-04-01 ENCOUNTER — Telehealth: Payer: Self-pay | Admitting: Family

## 2018-04-01 NOTE — Telephone Encounter (Signed)
Patient advised orders in place for mammogram and gave number to [facility] and she can call to schedule appointment .

## 2018-04-01 NOTE — Telephone Encounter (Signed)
Copied from [place] [id number]. Topic: General - Other >> Apr 01, 2018 12:45 PM Keene Breath wrote: Reason for CRM: Patient called to request a referral for a mammogram.  Patient stated that she is a former cancer patient and it is time for her to have a mammogram and they stated that she would need a referral from her PCP.  Please advise.  CB# [phone]

## 2018-04-02 NOTE — Telephone Encounter (Signed)
Noted diagnositc mammogram orders in system ; and pt scheduled

## 2018-04-15 ENCOUNTER — Other Ambulatory Visit: Payer: Self-pay | Admitting: *Deleted

## 2018-04-15 DIAGNOSIS — C8311 Mantle cell lymphoma, lymph nodes of head, face, and neck: Secondary | ICD-10-CM

## 2018-04-16 ENCOUNTER — Inpatient Hospital Stay (HOSPITAL_BASED_OUTPATIENT_CLINIC_OR_DEPARTMENT_OTHER): Payer: Medicare HMO | Admitting: Internal Medicine

## 2018-04-16 ENCOUNTER — Other Ambulatory Visit: Payer: Self-pay

## 2018-04-16 ENCOUNTER — Encounter: Payer: Self-pay | Admitting: Internal Medicine

## 2018-04-16 ENCOUNTER — Inpatient Hospital Stay: Payer: Medicare HMO | Attending: Internal Medicine

## 2018-04-16 VITALS — BP 153/82 | HR 71 | Temp 97.9°F | Resp 18

## 2018-04-16 DIAGNOSIS — C8311 Mantle cell lymphoma, lymph nodes of head, face, and neck: Secondary | ICD-10-CM | POA: Diagnosis not present

## 2018-04-16 LAB — COMPREHENSIVE METABOLIC PANEL
ALT: 12 U/L (ref 0–44)
AST: 19 U/L (ref 15–41)
Albumin: 3.9 g/dL (ref 3.5–5.0)
Alkaline Phosphatase: 71 U/L (ref 38–126)
Anion gap: 6 (ref 5–15)
BUN: 15 mg/dL (ref 8–23)
CO2: 28 mmol/L (ref 22–32)
Calcium: 9.2 mg/dL (ref 8.9–10.3)
Chloride: 106 mmol/L (ref 98–111)
Creatinine, Ser: 0.83 mg/dL (ref 0.44–1.00)
GFR calc Af Amer: >60 mL/min (ref >60)
GFR calc non Af Amer: >60 mL/min (ref >60)
Glucose, Bld: 99 mg/dL (ref 70–99)
Potassium: 4.6 mmol/L (ref 3.5–5.1)
Sodium: 140 mmol/L (ref 135–145)
Total Bilirubin: 0.6 mg/dL (ref 0.3–1.2)
Total Protein: 6.8 g/dL (ref 6.5–8.1)

## 2018-04-16 LAB — CBC WITH DIFFERENTIAL/PLATELET
Abs Immature Granulocytes: 0.02 10*3/uL (ref 0.00–0.07)
Basophils Absolute: 0 10*3/uL (ref 0.0–0.1)
Basophils Relative: 0 %
Eosinophils Absolute: 0.1 10*3/uL (ref 0.0–0.5)
Eosinophils Relative: 2 %
HCT: 41.3 % (ref 36.0–46.0)
Hemoglobin: 13.3 g/dL (ref 12.0–15.0)
Immature Granulocytes: 0 %
Lymphocytes Relative: 16 %
Lymphs Abs: 1 10*3/uL (ref 0.7–4.0)
MCH: 29 pg (ref 26.0–34.0)
MCHC: 32.2 g/dL (ref 30.0–36.0)
MCV: 90.2 fL (ref 80.0–100.0)
Monocytes Absolute: 0.4 10*3/uL (ref 0.1–1.0)
Monocytes Relative: 6 %
Neutro Abs: 4.6 10*3/uL (ref 1.7–7.7)
Neutrophils Relative %: 76 %
Platelets: 190 10*3/uL (ref 150–400)
RBC: 4.58 MIL/uL (ref 3.87–5.11)
RDW: 14.3 % (ref 11.5–15.5)
WBC: 6.1 10*3/uL (ref 4.0–10.5)
nRBC: 0 % (ref 0.0–0.2)

## 2018-04-16 LAB — LACTATE DEHYDROGENASE: LDH: 173 U/L (ref 98–192)

## 2018-04-16 MED ORDER — SODIUM CHLORIDE 0.9% FLUSH
10.0000 mL | Freq: Once | INTRAVENOUS | Status: AC
  Administered 2018-04-16: 10 mL via INTRAVENOUS
  Filled 2018-04-16: qty 10

## 2018-04-16 MED ORDER — HEPARIN SOD (PORK) LOCK FLUSH 100 UNIT/ML IV SOLN
INTRAVENOUS | Status: AC
  Filled 2018-04-16: qty 5

## 2018-04-16 MED ORDER — HEPARIN SOD (PORK) LOCK FLUSH 100 UNIT/ML IV SOLN
500.0000 [IU] | Freq: Once | INTRAVENOUS | Status: AC
  Administered 2018-04-16: 500 [IU] via INTRAVENOUS

## 2018-04-16 NOTE — Progress Notes (Signed)
[facility] OFFICE PROGRESS NOTE  Patient Care Team: Burnard Hawthorne, FNP as PCP - General (Family Medicine) Jackolyn Confer, MD (Internal Medicine)  Cancer Staging No matching staging information was found for the patient.   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 Feb 9th- START Benda-Ritux with neulasta; Prolonged neutropenia; DISCONT- Benda-Ritux;   # April 13 th 2017- START R-CHOP x1; severe/prolonged neutropenia; PET- CR; BMBx-Neg; Disc R-CHOP  # 26th MAY 2017- Start Rituxan q 36M Main OCT 12th 2017- PET NED.  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  female pleasant patient above history of mantle cell lymphoma currently on surveillance.  Patient is under a lot of stress given her husband's recent diagnosis of lymphoma; treated with chemotherapy at [facility].  He had been sick in the ICU recently post chemotherapy with renal failure.  Currently home.  Otherwise patient denies any unusual shortness of breath or cough.  Denies any lumps or bumps.  No chest pain.  No headaches.   Review of Systems  Constitutional: Negative for chills, diaphoresis, fever, malaise/fatigue and weight loss.  HENT: Negative for nosebleeds and sore throat.   Eyes: Negative for double vision.  Respiratory: Negative for hemoptysis, sputum production and wheezing.   Cardiovascular: Negative for chest pain, palpitations, orthopnea and leg swelling.   Gastrointestinal: Negative for abdominal pain, blood in stool, constipation, diarrhea, heartburn, melena, nausea and vomiting.  Genitourinary: Negative for dysuria, frequency and urgency.  Musculoskeletal: Negative for back pain and joint pain.  Skin: Negative.  Negative for itching and rash.  Neurological: Negative for dizziness, tingling, focal weakness, weakness and headaches.  Endo/Heme/Allergies: Does not bruise/bleed easily.  Psychiatric/Behavioral: Negative for depression. The patient is not nervous/anxious and does not have insomnia.       PAST MEDICAL HISTORY :  Past Medical History:  Diagnosis Date  . Arthritis   . Collagen vascular disease ([place])   . GERD (gastroesophageal reflux disease)   . Headache(784.0)   . History of methotrexate therapy   . Hyperlipidemia    hx  . Lymphadenopathy of head and neck 01/2015   see on Thyroid ultrasound  . Lymphoma, mantle cell ([place]) 06/01/2015   bx of lymph node in right breast/Stage IV Mantle Cell Lymphoma  . Rheumatoid arthritis ([place])     PAST SURGICAL HISTORY :   Past Surgical History:  Procedure Laterality Date  . CARDIAC CATHETERIZATION  09/2004   [facility]; EF 60%  . CARDIAC CATHETERIZATION  08/2004   [facility]  . PERIPHERAL VASCULAR CATHETERIZATION N/A 07/04/2015   Procedure: [name] Insertion;  Surgeon: [name], MD;  Location: [facility];  Service: Cardiovascular;  Laterality: N/A;    FAMILY HISTORY :   Family History  Problem Relation Age of Onset  . Diabetes Mother   . Cholelithiasis Mother   . Hypertension Sister   . Diabetes Sister   . Arthritis Brother   . Breast cancer  Neg Hx     SOCIAL HISTORY:   Social History   Tobacco Use  . Smoking status: Never Smoker  . Smokeless tobacco: Never Used  Substance Use Topics  . Alcohol use: No  . Drug use: No    ALLERGIES:  has No Known Allergies.  MEDICATIONS:  Current Outpatient Medications  Medication Sig Dispense Refill  . acetaminophen (TYLENOL)  500 MG tablet Take 500 mg by mouth every 6 (six) hours as needed.    . Calcium Carbonate-Vit D-Min (CALTRATE 600+D PLUS) 600-800 MG-UNIT CHEW Chew 1 tablet by mouth 2 (two) times daily. 60 tablet 6  . cyanocobalamin 500 MCG tablet Take 500 mcg by mouth daily. Take two gummies daily    . fluticasone (FLONASE) 50 MCG/ACT nasal spray SPRAY TWICE IN EACH NOSTRIL ONCE DAILY  4  . meclizine (ANTIVERT) 25 MG tablet Take 25 mg by mouth 3 (three) times daily as needed for dizziness.    . Multiple Vitamins-Minerals (MULTIVITAMIN GUMMIES ADULT) CHEW Chew by mouth. Reported on 09/06/2015    . Omega-3 1000 MG CAPS Take 1 capsule by mouth daily.     . ondansetron (ZOFRAN) 8 MG tablet Take 1 tablet (8 mg total) by mouth every 8 (eight) hours as needed for nausea or vomiting (start 3 days; after chemo). 40 tablet 0  . pravastatin (PRAVACHOL) 40 MG tablet TAKE 1 TABLET BY MOUTH EVERY DAY 90 tablet 1  . PREVIDENT 0.2 % SOLN RINSE AFTER THOROUGH BRUSHING  10  . lidocaine-prilocaine (EMLA) cream Apply 1 application topically as needed. (Patient not taking: Reported on 02/12/2018) 30 g 11  . prochlorperazine (COMPAZINE) 10 MG tablet Take 1 tablet (10 mg total) by mouth every 6 (six) hours as needed for nausea or vomiting. (Patient not taking: Reported on 02/12/2018) 30 tablet 0  . VENTOLIN HFA 108 (90 Base) MCG/ACT inhaler TAKE 2 PUFFS BY MOUTH EVERY 6 HOURS AS NEEDED FOR WHEEZE OR SHORTNESS OF BREATH (Patient not taking: Reported on 02/12/2018) 18 Inhaler 5   No current facility-administered medications for this visit.    Facility-Administered Medications Ordered in Other Visits  Medication Dose Route Frequency Provider Last Rate Last Dose  . sodium chloride flush (NS) 0.9 % injection 10 mL  10 mL Intravenous Once Charlaine Dalton R, MD      . Tbo-[facility]) injection 480 mcg  480 mcg Subcutaneous Once Cammie Sickle, MD        PHYSICAL EXAMINATION: ECOG PERFORMANCE STATUS: 0 - Asymptomatic  BP  (!) 153/82   Pulse 71   Temp 97.9 F (36.6 C) (Tympanic)   Resp 18   There were no vitals filed for this visit.  Physical Exam  Constitutional: She is oriented to person, place, and time and well-developed, well-nourished, and in no distress.  Obese.  She is alone.  Walking herself.  HENT:  Head: Normocephalic and atraumatic.  Mouth/Throat: Oropharynx is clear and moist. No oropharyngeal exudate.  Eyes: Pupils are equal, round, and reactive to light.  Neck: Normal range of motion. Neck supple.  Cardiovascular: Normal rate and regular rhythm.  Pulmonary/Chest: No respiratory distress. She has no wheezes.  Abdominal: Soft. Bowel sounds are normal. She exhibits no distension and no mass. There is no tenderness. There is no rebound and no guarding.  Musculoskeletal: Normal range of motion. She exhibits no edema or tenderness.  Neurological: She is alert and oriented to person, place, and time.  Skin: Skin is warm.  Psychiatric: Affect normal.  LABORATORY DATA:  I have reviewed the data as listed    Component Value Date/Time   NA 140 04/16/2018 1327   NA 142 09/14/2013 1127   K 4.6 04/16/2018 1327   K 3.5 09/14/2013 1127   CL 106 04/16/2018 1327   CL 110 (H) 09/14/2013 1127   CO2 28 04/16/2018 1327   CO2 29 09/14/2013 1127   GLUCOSE 99 04/16/2018 1327   GLUCOSE 106 (H) 09/14/2013 1127   BUN 15 04/16/2018 1327   BUN 8 09/14/2013 1127   CREATININE 0.83 04/16/2018 1327   CREATININE 0.71 09/14/2013 1127   CREATININE 0.68 04/01/2013 1550   CALCIUM 9.2 04/16/2018 1327   CALCIUM 8.6 09/14/2013 1127   PROT 6.8 04/16/2018 1327   PROT 7.6 09/14/2013 1127   ALBUMIN 3.9 04/16/2018 1327   ALBUMIN 3.2 (L) 09/14/2013 1127   AST 19 04/16/2018 1327   AST 22 09/14/2013 1127   ALT 12 04/16/2018 1327   ALT 19 09/14/2013 1127   ALKPHOS 71 04/16/2018 1327   ALKPHOS 76 09/14/2013 1127   BILITOT 0.6 04/16/2018 1327   BILITOT 0.4 09/14/2013 1127   GFRNONAA >60 04/16/2018 1327    GFRNONAA >60 09/14/2013 1127   GFRAA >60 04/16/2018 1327   GFRAA >60 09/14/2013 1127    No results found for: SPEP, UPEP  Lab Results  Component Value Date   WBC 6.1 04/16/2018   NEUTROABS 4.6 04/16/2018   HGB 13.3 04/16/2018   HCT 41.3 04/16/2018   MCV 90.2 04/16/2018   PLT 190 04/16/2018      Chemistry      Component Value Date/Time   NA 140 04/16/2018 1327   NA 142 09/14/2013 1127   K 4.6 04/16/2018 1327   K 3.5 09/14/2013 1127   CL 106 04/16/2018 1327   CL 110 (H) 09/14/2013 1127   CO2 28 04/16/2018 1327   CO2 29 09/14/2013 1127   BUN 15 04/16/2018 1327   BUN 8 09/14/2013 1127   CREATININE 0.83 04/16/2018 1327   CREATININE 0.71 09/14/2013 1127   CREATININE 0.68 04/01/2013 1550      Component Value Date/Time   CALCIUM 9.2 04/16/2018 1327   CALCIUM 8.6 09/14/2013 1127   ALKPHOS 71 04/16/2018 1327   ALKPHOS 76 09/14/2013 1127   AST 19 04/16/2018 1327   AST 22 09/14/2013 1127   ALT 12 04/16/2018 1327   ALT 19 09/14/2013 1127   BILITOT 0.6 04/16/2018 1327   BILITOT 0.4 09/14/2013 1127       RADIOGRAPHIC STUDIES: I have personally reviewed the radiological images as listed and agreed with the findings in the report. No results found.   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  Stable.  #Continue surveillance at this time.  If progression noted would recommend ibrutinib.  Plan to get a CT scan in 2 months chest and pelvis.  # Chronic arthritis-NSAIDs as needed.  Stable.  # DISPOSITION:  #  follow up in 2 months-MD /labs-cbc/cmp/ldh/CT c/a/p prior- Dr.B  # 25 minutes face-to-face with the patient discussing the above plan of care; more than 50% of time spent on prognosis/ natural history; counseling and coordination.     Orders Placed This  Encounter  Procedures  . CT CHEST W CONTRAST    Standing Status:   Future    Standing Expiration  Date:   04/17/2019    Order Specific Question:   If indicated for the ordered procedure, I authorize the administration of contrast media per Radiology protocol    Answer:   Yes    Order Specific Question:   Preferred imaging location?    Answer:   [facility]    Order Specific Question:   Radiology Contrast Protocol - do NOT remove file path    Answer:   [email or web address]    Order Specific Question:   ** REASON FOR EXAM (FREE TEXT)    Answer:   mantle cell lymphoma on surviellaince  . CT Abdomen Pelvis W Contrast    Standing Status:   Future    Standing Expiration Date:   04/16/2019    Order Specific Question:   ** REASON FOR EXAM (FREE TEXT)    Answer:   mantle cell lymphoma    Order Specific Question:   If indicated for the ordered procedure, I authorize the administration of contrast media per Radiology protocol    Answer:   Yes    Order Specific Question:   Preferred imaging location?    Answer:   [facility]    Order Specific Question:   Is Oral Contrast requested for this exam?    Answer:   Yes, Per Radiology protocol    Order Specific Question:   Radiology Contrast Protocol - do NOT remove file path    Answer:   [email or web address]  . CBC with Differential    Standing Status:   Future    Standing Expiration Date:   04/17/2019  . Comprehensive metabolic panel    Standing Status:   Future    Standing Expiration Date:   04/17/2019  . Lactate dehydrogenase    Standing Status:   Future    Standing Expiration Date:   04/17/2019   All questions were answered. The patient knows to call the clinic with any problems, questions or concerns.      Cammie Sickle, MD 04/19/2018 7:39 AM

## 2018-04-16 NOTE — Assessment & Plan Note (Addendum)
#  Mantle cell lymphoma stage IV-currently on surveillance [last Rituxan August 2019]; July 2019- CT- NED; however sub-pleural consolidation [see discussion below].  Stable.  #Continue surveillance at this time.  If progression noted would recommend ibrutinib.  Plan to get a CT scan in 2 months chest and pelvis.  # Chronic arthritis-NSAIDs as needed.  Stable.  # DISPOSITION:  #  follow up in 2 months-MD /labs-cbc/cmp/ldh/CT c/a/p prior- Dr.B  # 25 minutes face-to-face with the patient discussing the above plan of care; more than 50% of time spent on prognosis/ natural history; counseling and coordination.

## 2018-04-22 ENCOUNTER — Ambulatory Visit
Admission: RE | Admit: 2018-04-22 | Discharge: 2018-04-22 | Disposition: A | Discharge status: Home / Self Care | Payer: Medicare HMO | Source: Ambulatory Visit | Attending: Family | Admitting: Family

## 2018-04-22 DIAGNOSIS — R928 Other abnormal and inconclusive findings on diagnostic imaging of breast: Secondary | ICD-10-CM | POA: Diagnosis not present

## 2018-04-22 DIAGNOSIS — R922 Inconclusive mammogram: Secondary | ICD-10-CM | POA: Diagnosis not present

## 2018-04-22 DIAGNOSIS — Z79899 Other long term (current) drug therapy: Secondary | ICD-10-CM | POA: Diagnosis not present

## 2018-04-22 DIAGNOSIS — Z961 Presence of intraocular lens: Secondary | ICD-10-CM | POA: Diagnosis not present

## 2018-04-22 DIAGNOSIS — H1859 Other hereditary corneal dystrophies: Secondary | ICD-10-CM | POA: Diagnosis not present

## 2018-04-22 HISTORY — DX: Personal history of antineoplastic chemotherapy: Z92.21

## 2018-05-25 DIAGNOSIS — H9319 Tinnitus, unspecified ear: Secondary | ICD-10-CM | POA: Diagnosis not present

## 2018-05-25 DIAGNOSIS — H6981 Other specified disorders of Eustachian tube, right ear: Secondary | ICD-10-CM | POA: Diagnosis not present

## 2018-05-25 DIAGNOSIS — H65112 Acute and subacute allergic otitis media (mucoid) (sanguinous) (serous), left ear: Secondary | ICD-10-CM | POA: Diagnosis not present

## 2018-06-09 DIAGNOSIS — H65112 Acute and subacute allergic otitis media (mucoid) (sanguinous) (serous), left ear: Secondary | ICD-10-CM | POA: Diagnosis not present

## 2018-06-09 DIAGNOSIS — H6982 Other specified disorders of Eustachian tube, left ear: Secondary | ICD-10-CM | POA: Diagnosis not present

## 2018-06-16 DIAGNOSIS — H65119 Acute and subacute allergic otitis media (mucoid) (sanguinous) (serous), unspecified ear: Secondary | ICD-10-CM | POA: Diagnosis not present

## 2018-06-16 DIAGNOSIS — H6983 Other specified disorders of Eustachian tube, bilateral: Secondary | ICD-10-CM | POA: Diagnosis not present

## 2018-06-17 ENCOUNTER — Ambulatory Visit: Payer: Self-pay | Admitting: Internal Medicine

## 2018-06-17 ENCOUNTER — Ambulatory Visit: Admission: RE | Admit: 2018-06-17 | Payer: Self-pay | Source: Ambulatory Visit

## 2018-06-17 ENCOUNTER — Ambulatory Visit
Admission: RE | Admit: 2018-06-17 | Discharge: 2018-06-17 | Disposition: A | Discharge status: Home / Self Care | Payer: Medicare HMO | Source: Ambulatory Visit | Attending: Internal Medicine | Admitting: Internal Medicine

## 2018-06-17 ENCOUNTER — Other Ambulatory Visit: Payer: Self-pay

## 2018-06-17 DIAGNOSIS — C8311 Mantle cell lymphoma, lymph nodes of head, face, and neck: Secondary | ICD-10-CM | POA: Diagnosis not present

## 2018-06-17 DIAGNOSIS — I700 Atherosclerosis of aorta: Secondary | ICD-10-CM | POA: Diagnosis not present

## 2018-06-17 LAB — POCT I-STAT CREATININE: Creatinine, Ser: 0.9 mg/dL (ref 0.44–1.00)

## 2018-06-17 MED ORDER — IOPAMIDOL (ISOVUE-300) INJECTION 61%
100.0000 mL | Freq: Once | INTRAVENOUS | Status: AC | PRN
  Administered 2018-06-17: 100 mL via INTRAVENOUS

## 2018-06-18 ENCOUNTER — Other Ambulatory Visit: Payer: Self-pay

## 2018-06-18 ENCOUNTER — Telehealth: Payer: Self-pay | Admitting: *Deleted

## 2018-06-18 ENCOUNTER — Encounter: Payer: Self-pay | Admitting: Internal Medicine

## 2018-06-18 ENCOUNTER — Inpatient Hospital Stay (HOSPITAL_BASED_OUTPATIENT_CLINIC_OR_DEPARTMENT_OTHER): Payer: Medicare HMO | Admitting: Internal Medicine

## 2018-06-18 ENCOUNTER — Ambulatory Visit
Admission: RE | Admit: 2018-06-18 | Discharge: 2018-06-18 | Disposition: A | Discharge status: Home / Self Care | Payer: Medicare HMO | Source: Ambulatory Visit | Attending: Internal Medicine | Admitting: Internal Medicine

## 2018-06-18 ENCOUNTER — Encounter (INDEPENDENT_AMBULATORY_CARE_PROVIDER_SITE_OTHER): Payer: Self-pay

## 2018-06-18 ENCOUNTER — Other Ambulatory Visit: Payer: Self-pay | Admitting: Internal Medicine

## 2018-06-18 ENCOUNTER — Telehealth (INDEPENDENT_AMBULATORY_CARE_PROVIDER_SITE_OTHER): Payer: Self-pay

## 2018-06-18 ENCOUNTER — Inpatient Hospital Stay: Payer: Medicare HMO | Attending: Internal Medicine

## 2018-06-18 ENCOUNTER — Encounter: Payer: Self-pay | Admitting: *Deleted

## 2018-06-18 VITALS — BP 133/88 | HR 72 | Temp 98.3°F | Resp 24 | Ht 67.0 in | Wt 253.3 lb

## 2018-06-18 DIAGNOSIS — T82898A Other specified complication of vascular prosthetic devices, implants and grafts, initial encounter: Secondary | ICD-10-CM | POA: Diagnosis not present

## 2018-06-18 DIAGNOSIS — C8311 Mantle cell lymphoma, lymph nodes of head, face, and neck: Secondary | ICD-10-CM | POA: Insufficient documentation

## 2018-06-18 DIAGNOSIS — Y831 Surgical operation with implant of artificial internal device as the cause of abnormal reaction of the patient, or of later complication, without mention of misadventure at the time of the procedure: Secondary | ICD-10-CM | POA: Diagnosis not present

## 2018-06-18 DIAGNOSIS — Z95828 Presence of other vascular implants and grafts: Secondary | ICD-10-CM

## 2018-06-18 DIAGNOSIS — J984 Other disorders of lung: Secondary | ICD-10-CM | POA: Insufficient documentation

## 2018-06-18 DIAGNOSIS — M069 Rheumatoid arthritis, unspecified: Secondary | ICD-10-CM

## 2018-06-18 DIAGNOSIS — T859XXA Unspecified complication of internal prosthetic device, implant and graft, initial encounter: Secondary | ICD-10-CM | POA: Diagnosis not present

## 2018-06-18 DIAGNOSIS — Z452 Encounter for adjustment and management of vascular access device: Secondary | ICD-10-CM

## 2018-06-18 HISTORY — PX: IR FLUORO GUIDED NEEDLE PLC ASPIRATION/INJECTION LOC: IMG2395

## 2018-06-18 LAB — CBC WITH DIFFERENTIAL/PLATELET
Abs Immature Granulocytes: 0.03 10*3/uL (ref 0.00–0.07)
Basophils Absolute: 0 10*3/uL (ref 0.0–0.1)
Basophils Relative: 0 %
Eosinophils Absolute: 0.2 10*3/uL (ref 0.0–0.5)
Eosinophils Relative: 3 %
HCT: 40.9 % (ref 36.0–46.0)
Hemoglobin: 13 g/dL (ref 12.0–15.0)
Immature Granulocytes: 1 %
Lymphocytes Relative: 15 %
Lymphs Abs: 0.9 10*3/uL (ref 0.7–4.0)
MCH: 29.1 pg (ref 26.0–34.0)
MCHC: 31.8 g/dL (ref 30.0–36.0)
MCV: 91.7 fL (ref 80.0–100.0)
Monocytes Absolute: 0.3 10*3/uL (ref 0.1–1.0)
Monocytes Relative: 5 %
Neutro Abs: 4.5 10*3/uL (ref 1.7–7.7)
Neutrophils Relative %: 76 %
Platelets: 195 10*3/uL (ref 150–400)
RBC: 4.46 MIL/uL (ref 3.87–5.11)
RDW: 15 % (ref 11.5–15.5)
WBC: 6 10*3/uL (ref 4.0–10.5)
nRBC: 0 % (ref 0.0–0.2)

## 2018-06-18 LAB — COMPREHENSIVE METABOLIC PANEL
ALT: 14 U/L (ref 0–44)
AST: 20 U/L (ref 15–41)
Albumin: 3.6 g/dL (ref 3.5–5.0)
Alkaline Phosphatase: 74 U/L (ref 38–126)
Anion gap: 7 (ref 5–15)
BUN: 12 mg/dL (ref 8–23)
CO2: 28 mmol/L (ref 22–32)
Calcium: 9 mg/dL (ref 8.9–10.3)
Chloride: 107 mmol/L (ref 98–111)
Creatinine, Ser: 0.79 mg/dL (ref 0.44–1.00)
GFR calc Af Amer: >60 mL/min (ref >60)
GFR calc non Af Amer: >60 mL/min (ref >60)
Glucose, Bld: 112 mg/dL — ABNORMAL HIGH (ref 70–99)
Potassium: 4.4 mmol/L (ref 3.5–5.1)
Sodium: 142 mmol/L (ref 135–145)
Total Bilirubin: 0.8 mg/dL (ref 0.3–1.2)
Total Protein: 6.6 g/dL (ref 6.5–8.1)

## 2018-06-18 LAB — LACTATE DEHYDROGENASE: LDH: 152 U/L (ref 98–192)

## 2018-06-18 MED ORDER — HEPARIN SOD (PORK) LOCK FLUSH 100 UNIT/ML IV SOLN
INTRAVENOUS | Status: AC
  Filled 2018-06-18: qty 5

## 2018-06-18 MED ORDER — HEPARIN SOD (PORK) LOCK FLUSH 100 UNIT/ML IV SOLN
500.0000 [IU] | Freq: Once | INTRAVENOUS | Status: DC
  Filled 2018-06-18: qty 5

## 2018-06-18 MED ORDER — SODIUM CHLORIDE 0.9% FLUSH
10.0000 mL | Freq: Once | INTRAVENOUS | Status: DC
  Filled 2018-06-18: qty 10

## 2018-06-18 MED ORDER — IOPAMIDOL (ISOVUE-300) INJECTION 61%
30.0000 mL | Freq: Once | INTRAVENOUS | Status: AC | PRN
  Administered 2018-06-18: 10 mL via INTRAVENOUS

## 2018-06-18 MED ORDER — SODIUM CHLORIDE (PF) 0.9 % IJ SOLN
INTRAMUSCULAR | Status: AC
  Filled 2018-06-18: qty 50

## 2018-06-18 NOTE — Telephone Encounter (Signed)
Called report   IMPRESSION: Extensive port catheter fibrin sheath with contrast backing up within the subcutaneous soft tissues along the port catheter tubing and extravasation in the right supraclavicular soft tissues at the port catheter / reservoir junction. Appearance concerning for catheter damage/leakage. Recommend discontinuing use of the port catheter and removal.  Findings discussed with the patient.  These results will be called to the ordering clinician or representative by the Radiologist Assistant, and communication documented in the PACS or zVision Dashboard.   Electronically Signed   By: Jerilynn Mages.  Shick M.D.   On: 06/18/2018 12:34

## 2018-06-18 NOTE — Progress Notes (Signed)
Port flushed with heparin flush and de-accessed.  Patient released to home.

## 2018-06-18 NOTE — Progress Notes (Signed)
[facility] OFFICE PROGRESS NOTE  Patient Care Team: Burnard Hawthorne, FNP as PCP - General (Family Medicine) Jackolyn Confer, MD (Internal Medicine)  Cancer Staging No matching staging information was found for the patient.   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 Feb 9th- START Benda-Ritux with neulasta; Prolonged neutropenia; DISCONT- Benda-Ritux;   # April 13 th 2017- START R-CHOP x1; severe/prolonged neutropenia; PET- CR; BMBx-Neg; Disc R-CHOP  # 26th MAY 2017- Start Rituxan q 28M Main OCT 12th 2017- PET NED.  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  female pleasant patient above history of mantle cell lymphoma currently on surveillance/reviewed the results of the restaging CAT scan.  Patient's appetite is good.  Denies any weight loss.  Denies any lumps or bumps.  No fever chills.  No headaches.  She is currently under a lot of stress given her husband's ill health.    Review of Systems  Constitutional: Negative for chills, diaphoresis, fever, malaise/fatigue and weight loss.  HENT: Negative for nosebleeds and sore throat.   Eyes: Negative for double vision.  Respiratory: Negative for hemoptysis, sputum production and wheezing.   Cardiovascular: Negative for chest pain, palpitations, orthopnea and leg swelling.  Gastrointestinal: Negative for abdominal pain, blood in stool, constipation, diarrhea, heartburn,  melena, nausea and vomiting.  Genitourinary: Negative for dysuria, frequency and urgency.  Musculoskeletal: Negative for back pain and joint pain.  Skin: Negative.  Negative for itching and rash.  Neurological: Negative for dizziness, tingling, focal weakness, weakness and headaches.  Endo/Heme/Allergies: Does not bruise/bleed easily.  Psychiatric/Behavioral: Negative for depression. The patient is not nervous/anxious and does not have insomnia.       PAST MEDICAL HISTORY :  Past Medical History:  Diagnosis Date  . Arthritis   . Collagen vascular disease ([place])   . GERD (gastroesophageal reflux disease)   . Headache(784.0)   . History of methotrexate therapy   . Hyperlipidemia    hx  . Lymphadenopathy of head and neck 01/2015   see on Thyroid ultrasound  . Lymphoma, mantle cell ([place]) 06/01/2015   bx of lymph node in right breast/Stage IV Mantle Cell Lymphoma  . Personal history of chemotherapy   . Rheumatoid arthritis ([place])     PAST SURGICAL HISTORY :   Past Surgical History:  Procedure Laterality Date  . CARDIAC CATHETERIZATION  09/2004   [facility]; EF 60%  . CARDIAC CATHETERIZATION  08/2004   [facility]  . PERIPHERAL VASCULAR CATHETERIZATION N/A 07/04/2015   Procedure: [name] Insertion;  Surgeon: [name], MD;  Location: [facility];  Service: Cardiovascular;  Laterality: N/A;    FAMILY HISTORY :   Family History  Problem Relation Age of Onset  . Diabetes Mother   . Cholelithiasis Mother   . Hypertension Sister   . Diabetes Sister   . Arthritis Brother   . Breast cancer Neg Hx     SOCIAL HISTORY:  Social History   Tobacco Use  . Smoking status: Never Smoker  . Smokeless tobacco: Never Used  Substance Use Topics  . Alcohol use: No  . Drug use: No    ALLERGIES:  has No Known Allergies.  MEDICATIONS:  Current Outpatient Medications  Medication Sig Dispense Refill  . acetaminophen (TYLENOL) 500 MG tablet Take 500 mg by mouth every 6 (six) hours as  needed.    . Calcium Carbonate-Vit D-Min (CALTRATE 600+D PLUS) 600-800 MG-UNIT CHEW Chew 1 tablet by mouth 2 (two) times daily. 60 tablet 6  . cyanocobalamin 500 MCG tablet Take 500 mcg by mouth daily. Take two gummies daily    . fluticasone (FLONASE) 50 MCG/ACT nasal spray SPRAY TWICE IN EACH NOSTRIL ONCE DAILY  4  . lidocaine-prilocaine (EMLA) cream Apply 1 application topically as needed. 30 g 11  . meclizine (ANTIVERT) 25 MG tablet Take 25 mg by mouth 3 (three) times daily as needed for dizziness.    . Multiple Vitamins-Minerals (MULTIVITAMIN GUMMIES ADULT) CHEW Chew by mouth. Reported on 09/06/2015    . Omega-3 1000 MG CAPS Take 1 capsule by mouth daily.     . ondansetron (ZOFRAN) 8 MG tablet Take 1 tablet (8 mg total) by mouth every 8 (eight) hours as needed for nausea or vomiting (start 3 days; after chemo). 40 tablet 0  . pravastatin (PRAVACHOL) 40 MG tablet TAKE 1 TABLET BY MOUTH EVERY DAY 90 tablet 1  . PREVIDENT 0.2 % SOLN RINSE AFTER THOROUGH BRUSHING  10  . prochlorperazine (COMPAZINE) 10 MG tablet Take 1 tablet (10 mg total) by mouth every 6 (six) hours as needed for nausea or vomiting. 30 tablet 0  . VENTOLIN HFA 108 (90 Base) MCG/ACT inhaler TAKE 2 PUFFS BY MOUTH EVERY 6 HOURS AS NEEDED FOR WHEEZE OR SHORTNESS OF BREATH 18 Inhaler 5   No current facility-administered medications for this visit.    Facility-Administered Medications Ordered in Other Visits  Medication Dose Route Frequency Provider Last Rate Last Dose  . heparin lock flush 100 unit/mL  500 Units Intracatheter Once Charlaine Dalton R, MD      . sodium chloride flush (NS) 0.9 % injection 10 mL  10 mL Intravenous Once Charlaine Dalton R, MD      . sodium chloride flush (NS) 0.9 % injection 10 mL  10 mL Intravenous Once Cammie Sickle, MD      . Tbo-Filgrastim [facility]) injection 480 mcg  480 mcg Subcutaneous Once Cammie Sickle, MD        PHYSICAL EXAMINATION: ECOG PERFORMANCE STATUS: 0 -  Asymptomatic  BP 133/88 (BP Location: Right Arm)   Pulse 72   Temp 98.3 F (36.8 C) (Tympanic)   Resp (!) 24   Ht 5\' 7"  (1.702 m)   Wt 253 lb 4.8 oz (114.9 kg)   BMI 39.67 kg/m   Filed Weights   06/18/18 0928  Weight: 253 lb 4.8 oz (114.9 kg)    Physical Exam  Constitutional: She is oriented to person, place, and time and well-developed, well-nourished, and in no distress.  Obese.  She is accompanied by husband.  Walking herself.  HENT:  Head: Normocephalic and atraumatic.  Mouth/Throat: Oropharynx is clear and moist. No oropharyngeal exudate.  Eyes: Pupils are equal, round, and reactive to light.  Neck: Normal range of motion. Neck supple.  Cardiovascular: Normal rate and regular rhythm.  Pulmonary/Chest: No respiratory distress. She has no wheezes.  Abdominal: Soft. Bowel sounds are normal. She exhibits no distension  and no mass. There is no abdominal tenderness. There is no rebound and no guarding.  Musculoskeletal: Normal range of motion.        General: No tenderness or edema.  Neurological: She is alert and oriented to person, place, and time.  Skin: Skin is warm.  Psychiatric: Affect normal.       LABORATORY DATA:  I have reviewed the data as listed    Component Value Date/Time   NA 142 06/18/2018 0837   NA 142 09/14/2013 1127   K 4.4 06/18/2018 0837   K 3.5 09/14/2013 1127   CL 107 06/18/2018 0837   CL 110 (H) 09/14/2013 1127   CO2 28 06/18/2018 0837   CO2 29 09/14/2013 1127   GLUCOSE 112 (H) 06/18/2018 0837   GLUCOSE 106 (H) 09/14/2013 1127   BUN 12 06/18/2018 0837   BUN 8 09/14/2013 1127   CREATININE 0.79 06/18/2018 0837   CREATININE 0.71 09/14/2013 1127   CREATININE 0.68 04/01/2013 1550   CALCIUM 9.0 06/18/2018 0837   CALCIUM 8.6 09/14/2013 1127   PROT 6.6 06/18/2018 0837   PROT 7.6 09/14/2013 1127   ALBUMIN 3.6 06/18/2018 0837   ALBUMIN 3.2 (L) 09/14/2013 1127   AST 20 06/18/2018 0837   AST 22 09/14/2013 1127   ALT 14 06/18/2018 0837   ALT  19 09/14/2013 1127   ALKPHOS 74 06/18/2018 0837   ALKPHOS 76 09/14/2013 1127   BILITOT 0.8 06/18/2018 0837   BILITOT 0.4 09/14/2013 1127   GFRNONAA >60 06/18/2018 0837   GFRNONAA >60 09/14/2013 1127   GFRAA >60 06/18/2018 0837   GFRAA >60 09/14/2013 1127    No results found for: SPEP, UPEP  Lab Results  Component Value Date   WBC 6.0 06/18/2018   NEUTROABS 4.5 06/18/2018   HGB 13.0 06/18/2018   HCT 40.9 06/18/2018   MCV 91.7 06/18/2018   PLT 195 06/18/2018      Chemistry      Component Value Date/Time   NA 142 06/18/2018 0837   NA 142 09/14/2013 1127   K 4.4 06/18/2018 0837   K 3.5 09/14/2013 1127   CL 107 06/18/2018 0837   CL 110 (H) 09/14/2013 1127   CO2 28 06/18/2018 0837   CO2 29 09/14/2013 1127   BUN 12 06/18/2018 0837   BUN 8 09/14/2013 1127   CREATININE 0.79 06/18/2018 0837   CREATININE 0.71 09/14/2013 1127   CREATININE 0.68 04/01/2013 1550      Component Value Date/Time   CALCIUM 9.0 06/18/2018 0837   CALCIUM 8.6 09/14/2013 1127   ALKPHOS 74 06/18/2018 0837   ALKPHOS 76 09/14/2013 1127   AST 20 06/18/2018 0837   AST 22 09/14/2013 1127   ALT 14 06/18/2018 0837   ALT 19 09/14/2013 1127   BILITOT 0.8 06/18/2018 0837   BILITOT 0.4 09/14/2013 1127       RADIOGRAPHIC STUDIES: I have personally reviewed the radiological images as listed and agreed with the findings in the report. Ct Chest W Contrast  Result Date: 06/17/2018 CLINICAL DATA:  Mantle cell lymphoma treated with chemotherapy. Restaging on interval surveillance. EXAM: CT CHEST, ABDOMEN, AND PELVIS WITH CONTRAST TECHNIQUE: Multidetector CT imaging of the chest, abdomen and pelvis was performed following the standard protocol during bolus administration of intravenous contrast. CONTRAST:  146mL ISOVUE-300 IOPAMIDOL (ISOVUE-300) INJECTION 61% COMPARISON:  12/21/2017 CT chest, abdomen and pelvis. FINDINGS: CT CHEST FINDINGS Cardiovascular: Top-normal heart size. No significant pericardial  effusion/thickening. Right internal jugular Port-A-Cath terminates in the upper third  of the SVC. Atherosclerotic nonaneurysmal thoracic aorta. Stable dilated main pulmonary artery (4.3 cm diameter). No central pulmonary emboli. Mediastinum/Nodes: No discrete thyroid nodules. Unremarkable esophagus. No pathologically enlarged axillary, mediastinal or hilar lymph nodes. Lungs/Pleura: No pneumothorax. No pleural effusion. No acute consolidative airspace disease, lung masses or significant pulmonary nodules. Basilar predominant patchy subpleural reticulation and ground-glass attenuation in both lungs with associated mild traction bronchiectasis and mild honeycombing, not appreciably changed. Musculoskeletal: No aggressive appearing focal osseous lesions. Marked thoracic spondylosis. CT ABDOMEN PELVIS FINDINGS Hepatobiliary: Normal liver with no liver mass. Normal gallbladder with no radiopaque cholelithiasis. No biliary ductal dilatation. Pancreas: Normal, with no mass or duct dilation. Spleen: Normal size. No mass. Adrenals/Urinary Tract: Normal adrenals. No hydronephrosis. No renal masses. Normal bladder. Stomach/Bowel: Normal non-distended stomach. Normal caliber small bowel with no small bowel wall thickening. Normal appendix. Oral contrast transits to the descending colon. Minimal left colonic diverticulosis, with no large bowel wall thickening or acute pericolonic fat stranding. Vascular/Lymphatic: Normal caliber abdominal aorta. Patent portal, splenic, hepatic and renal veins. No pathologically enlarged lymph nodes in the abdomen or pelvis. Reproductive: Grossly normal uterus.  No adnexal mass. Other: No pneumoperitoneum, ascites or focal fluid collection. Musculoskeletal: No aggressive appearing focal osseous lesions. Marked lumbar spondylosis. IMPRESSION: 1. No lymphadenopathy or other findings of recurrent lymphoma in the chest, abdomen or pelvis. Normal size spleen. 2. Stable dilated main pulmonary artery,  suggesting chronic pulmonary arterial hypertension. 3. 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 [place], Iss 5, [phone], Jan 31 2017. 4.  Aortic Atherosclerosis (ICD10-I70.0). Electronically Signed   By: Ilona Sorrel M.D.   On: 06/17/2018 09:19   Ct Abdomen Pelvis W Contrast  Result Date: 06/17/2018 CLINICAL DATA:  Mantle cell lymphoma treated with chemotherapy. Restaging on interval surveillance. EXAM: CT CHEST, ABDOMEN, AND PELVIS WITH CONTRAST TECHNIQUE: Multidetector CT imaging of the chest, abdomen and pelvis was performed following the standard protocol during bolus administration of intravenous contrast. CONTRAST:  142mL ISOVUE-300 IOPAMIDOL (ISOVUE-300) INJECTION 61% COMPARISON:  12/21/2017 CT chest, abdomen and pelvis. FINDINGS: CT CHEST FINDINGS Cardiovascular: Top-normal heart size. No significant pericardial effusion/thickening. Right internal jugular Port-A-Cath terminates in the upper third of the SVC. Atherosclerotic nonaneurysmal thoracic aorta. Stable dilated main pulmonary artery (4.3 cm diameter). No central pulmonary emboli. Mediastinum/Nodes: No discrete thyroid nodules. Unremarkable esophagus. No pathologically enlarged axillary, mediastinal or hilar lymph nodes. Lungs/Pleura: No pneumothorax. No pleural effusion. No acute consolidative airspace disease, lung masses or significant pulmonary nodules. Basilar predominant patchy subpleural reticulation and ground-glass attenuation in both lungs with associated mild traction bronchiectasis and mild honeycombing, not appreciably changed. Musculoskeletal: No aggressive appearing focal osseous lesions. Marked thoracic spondylosis. CT ABDOMEN  PELVIS FINDINGS Hepatobiliary: Normal liver with no liver mass. Normal gallbladder with no radiopaque cholelithiasis. No biliary ductal dilatation. Pancreas: Normal, with no mass or duct dilation. Spleen: Normal size. No mass. Adrenals/Urinary Tract: Normal adrenals. No hydronephrosis. No renal masses. Normal bladder. Stomach/Bowel: Normal non-distended stomach. Normal caliber small bowel with no small bowel wall thickening. Normal appendix. Oral contrast transits to the descending colon. Minimal left colonic diverticulosis, with no large bowel wall thickening or acute pericolonic fat stranding. Vascular/Lymphatic: Normal caliber abdominal aorta. Patent portal, splenic, hepatic and renal veins. No pathologically enlarged lymph nodes in the abdomen or  pelvis. Reproductive: Grossly normal uterus.  No adnexal mass. Other: No pneumoperitoneum, ascites or focal fluid collection. Musculoskeletal: No aggressive appearing focal osseous lesions. Marked lumbar spondylosis. IMPRESSION: 1. No lymphadenopathy or other findings of recurrent lymphoma in the chest, abdomen or pelvis. Normal size spleen. 2. Stable dilated main pulmonary artery, suggesting chronic pulmonary arterial hypertension. 3. 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 [place], Iss 5, [phone], Jan 31 2017. 4.  Aortic Atherosclerosis (ICD10-I70.0). Electronically Signed   By: Ilona Sorrel M.D.   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  #  continue surveillance-off any treatment.  We will again repeat a CT scan in 4 to 6 months.  # Chronic arthritis-NSAIDs as needed.  Stable.  #  Bilateral lung base scarring- ? Chronic/stable.   #Port malfunction not flushing well-recommend a dye study.  # DISPOSITION: # port flush every 2 months #  follow up in 2 months-MD /labs-cbc/cmp/ldh/port flush- Dr.B  # I reviewed the blood work- with the patient in detail; also reviewed the imaging independently [as summarized above]; and with the patient in detail.    # 25 minutes face-to-face with the patient discussing the above plan of care; more than 50% of time spent on prognosis/ natural history; counseling and coordination.     No orders of the defined types were placed in this encounter.  All questions were answered. The patient knows to call the clinic with any problems, questions or concerns.      Cammie Sickle, MD 06/18/2018 12:26 PM

## 2018-06-18 NOTE — Telephone Encounter (Signed)
I spoke with Dr. Rogue Bussing. Referral placed for Dr. Lucky Cowboy to remove the port. I contacted the patient to explain that the port a cath will need to be removed. She asked if another port a cath needed to be placed at this time. I explained that at this time, MD will hold off placing a new port. MD also spoke with patient. Will wait to see if new port is needed. MD states that pt may be eligable for oral biotherapy/chemotherapy if she should progress.

## 2018-06-18 NOTE — Addendum Note (Signed)
Addended by: Sabino Gasser on: 06/18/2018 02:51 PM   Modules accepted: Orders

## 2018-06-18 NOTE — Progress Notes (Signed)
3 attempts to flush patient's port. Michaella Imai RN and Sharyn Lull RN attempted port flush. Pt repositioned multiple times. No blood return present. NS flush attempted. Swelling noted above port site when attempting to flush port with normal saline. No resistance felt when pushing NS flush. Patient notes mild tenderness at site. Requested order for dye study from Dr. Rogue Bussing. V/o obtain to proceed with dye study. Dye study to be performed at 1130 am. Port left accessed.

## 2018-06-18 NOTE — Progress Notes (Signed)
Patient here for follow up. No changes since her last visit. She is here for results.

## 2018-06-18 NOTE — Telephone Encounter (Signed)
Spoke with the patient to get her scheduled to have her port removed with Dr. Lucky Cowboy . The patient agreed to 06/23/2018 with a 9:00 am arrival time. In formation will be mailed out to the patient.

## 2018-06-18 NOTE — Assessment & Plan Note (Addendum)
#  Mantle cell lymphoma stage IV-currently on surveillance [last Rituxan August 2019]; JAN 2020 CT- NED; except mild bil lung base scarring/see below.  # continue surveillance-off any treatment.  We will again repeat a CT scan in 4 to 6 months.  # Chronic arthritis-NSAIDs as needed.  Stable.  #  Bilateral lung base scarring- ? Chronic/stable.   #Port malfunction not flushing well-recommend a dye study.  # DISPOSITION: # port flush every 2 months #  follow up in 2 months-MD /labs-cbc/cmp/ldh/port flush- Dr.B  # I reviewed the blood work- with the patient in detail; also reviewed the imaging independently [as summarized above]; and with the patient in detail.    # 25 minutes face-to-face with the patient discussing the above plan of care; more than 50% of time spent on prognosis/ natural history; counseling and coordination.

## 2018-06-22 ENCOUNTER — Other Ambulatory Visit (INDEPENDENT_AMBULATORY_CARE_PROVIDER_SITE_OTHER): Payer: Self-pay | Admitting: Nurse Practitioner

## 2018-06-22 ENCOUNTER — Telehealth: Payer: Self-pay

## 2018-06-22 NOTE — Telephone Encounter (Signed)
Copied from [place] [id number]. 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

## 2018-06-23 ENCOUNTER — Ambulatory Visit
Admission: RE | Admit: 2018-06-23 | Discharge: 2018-06-23 | Disposition: A | Discharge status: Home / Self Care | Payer: Medicare HMO | Attending: Vascular Surgery | Admitting: Vascular Surgery

## 2018-06-23 ENCOUNTER — Other Ambulatory Visit: Payer: Self-pay

## 2018-06-23 ENCOUNTER — Encounter
Admission: RE | Disposition: A | Discharge status: Home / Self Care | Payer: Self-pay | Source: Home / Self Care | Attending: Vascular Surgery

## 2018-06-23 DIAGNOSIS — C8590 Non-Hodgkin lymphoma, unspecified, unspecified site: Secondary | ICD-10-CM | POA: Diagnosis not present

## 2018-06-23 DIAGNOSIS — Z452 Encounter for adjustment and management of vascular access device: Secondary | ICD-10-CM | POA: Insufficient documentation

## 2018-06-23 DIAGNOSIS — Z95828 Presence of other vascular implants and grafts: Secondary | ICD-10-CM | POA: Diagnosis not present

## 2018-06-23 DIAGNOSIS — C8310 Mantle cell lymphoma, unspecified site: Secondary | ICD-10-CM

## 2018-06-23 DIAGNOSIS — Z8572 Personal history of non-Hodgkin lymphomas: Secondary | ICD-10-CM | POA: Insufficient documentation

## 2018-06-23 HISTORY — PX: PORTA CATH REMOVAL: CATH118286

## 2018-06-23 SURGERY — PORTA CATH REMOVAL
Anesthesia: Moderate Sedation

## 2018-06-23 MED ORDER — LIDOCAINE-EPINEPHRINE (PF) 1 %-1:200000 IJ SOLN
INTRAMUSCULAR | Status: AC
  Filled 2018-06-23: qty 30

## 2018-06-23 MED ORDER — MIDAZOLAM HCL 5 MG/5ML IJ SOLN
INTRAMUSCULAR | Status: AC
  Filled 2018-06-23: qty 5

## 2018-06-23 MED ORDER — HYDROMORPHONE HCL 1 MG/ML IJ SOLN: 1.0000 mg | Freq: Once | INTRAMUSCULAR | Status: DC | PRN

## 2018-06-23 MED ORDER — FENTANYL CITRATE (PF) 100 MCG/2ML IJ SOLN
INTRAMUSCULAR | Status: AC
  Filled 2018-06-23: qty 2

## 2018-06-23 MED ORDER — MIDAZOLAM HCL 2 MG/2ML IJ SOLN
INTRAMUSCULAR | Status: DC | PRN
  Administered 2018-06-23: 2 mg via INTRAVENOUS
  Administered 2018-06-23 (×3): 1 mg via INTRAVENOUS

## 2018-06-23 MED ORDER — DEXTROSE 5 % IV SOLN
2.0000 g | Freq: Once | INTRAVENOUS | Status: AC
  Administered 2018-06-23: 2 g via INTRAVENOUS
  Filled 2018-06-23: qty 20

## 2018-06-23 MED ORDER — ONDANSETRON HCL 4 MG/2ML IJ SOLN: 4.0000 mg | Freq: Four times a day (QID) | INTRAMUSCULAR | Status: DC | PRN

## 2018-06-23 MED ORDER — SODIUM CHLORIDE 0.9 % IV SOLN
INTRAVENOUS | Status: DC
  Administered 2018-06-23: 09:00:00 via INTRAVENOUS

## 2018-06-23 MED ORDER — CEFAZOLIN SODIUM-DEXTROSE 2-4 GM/100ML-% IV SOLN
INTRAVENOUS | Status: AC
  Filled 2018-06-23: qty 100

## 2018-06-23 MED ORDER — HEPARIN SODIUM (PORCINE) 1000 UNIT/ML IJ SOLN
INTRAMUSCULAR | Status: AC
  Filled 2018-06-23: qty 1

## 2018-06-23 MED ORDER — FENTANYL CITRATE (PF) 100 MCG/2ML IJ SOLN
INTRAMUSCULAR | Status: DC | PRN
  Administered 2018-06-23 (×2): 50 ug via INTRAVENOUS

## 2018-06-23 SURGICAL SUPPLY — 5 items
PACK ANGIOGRAPHY (CUSTOM PROCEDURE TRAY) ×2 IMPLANT
PENCIL ELECTRO HAND CTR (MISCELLANEOUS) ×2 IMPLANT
SUT MNCRL AB 4-0 PS2 18 (SUTURE) ×2 IMPLANT
SUT VIC AB 3-0 SH 27 (SUTURE) ×1
SUT VIC AB 3-0 SH 27X BRD (SUTURE) ×1 IMPLANT

## 2018-06-23 NOTE — Telephone Encounter (Signed)
LMTCB to schedule appointment to discuss letter.

## 2018-06-23 NOTE — H&P (Signed)
[facility] History & Physical Update  The patient was interviewed and re-examined.  The patient's previous History and Physical has been reviewed and is unchanged.  There is no change in the plan of care. We plan to proceed with the scheduled procedure.  Leotis Pain, MD  06/23/2018, 9:15 AM

## 2018-06-23 NOTE — Telephone Encounter (Signed)
Call patient This is not sound like a problem however I last saw her in June.  Please advise a follow appointment and we can do letter at time of visit.

## 2018-06-23 NOTE — Op Note (Signed)
[facility]       Operative Note  Date: 06/23/2018  Preoperative diagnosis:  1. Lymphoma, completed therapy and no longer using port  Postoperative diagnosis:  Same as above  Procedures: #1. Removal of right jugular port a cath   Surgeon: Leotis Pain, MD  Anesthesia: Local with moderate conscious sedation for 20 minutes using 5 mg of Versed and 100 mcg of Fentanyl  Fluoroscopy time: none  Contrast used: 0  Estimated blood loss: Minimal  Indication for the procedure:  The patient is a 78 y.o. female who has completed her therapy for lymphoma and no longer needs their Port-A-Cath. The patient desires to have this removed. Risks and benefits including need for potential replacement with recurrent disease were discussed and patient is agreeable to proceed.  Description of procedure: The patient was brought to the vascular and interventional radiology suite. Moderate conscious sedation was administered during a face to face encounter with the patient throughout the procedure with my supervision of the RN administering medicines and monitoring the patient's vital signs, pulse oximetry, telemetry and mental status throughout from the start of the procedure until the patient was taken to the recovery room.  The right neck chest and shoulder were sterilely prepped and draped, and a sterile surgical field was created. The area was then anesthetized with 1% lidocaine copiously. The previous incision was reopened and electrocautery used to dissected down to the port and the catheter. These were dissected free and the catheter was gently removed from the vein in its entirety. The port was dissected out from the fibrous connective tissue and the Prolene sutures were removed. The port was then removed in its entirety including the catheter. The wound was then closed with a 3-0 Vicryl and a 4-0 Monocryl and Dermabond was placed as a dressing. The  patient was then taken to the recovery room in stable condition having tolerated the procedure well.  Complications: none  Condition: stable   Leotis Pain, MD 06/23/2018 10:38 AM   This note was created with Dragon Medical transcription system. Any errors in dictation are purely unintentional.

## 2018-06-28 ENCOUNTER — Ambulatory Visit (INDEPENDENT_AMBULATORY_CARE_PROVIDER_SITE_OTHER): Payer: Medicare HMO | Admitting: Family

## 2018-06-28 ENCOUNTER — Other Ambulatory Visit: Payer: Self-pay

## 2018-06-28 ENCOUNTER — Encounter: Payer: Self-pay | Admitting: Family

## 2018-06-28 DIAGNOSIS — C8311 Mantle cell lymphoma, lymph nodes of head, face, and neck: Secondary | ICD-10-CM

## 2018-06-28 MED ORDER — OMEPRAZOLE 40 MG PO CPDR: 40.0000 mg | DELAYED_RELEASE_CAPSULE | Freq: Every day | ORAL | 3 refills | Status: DC

## 2018-06-28 NOTE — Patient Instructions (Addendum)
For next appointment, please fast so we can check cholesterol  Nice to see you, always!

## 2018-06-28 NOTE — Progress Notes (Signed)
Subjective:    Patient ID: Michelle [name], female    DOB: 1940/11/08, 78 y.o.   MRN: [id number]  CC: Michelle [name] is a 78 y.o. female who presents today for follow up.   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   Accompanied by husband today.    Uses cane to ambulate due to bilateral knee pain, which has improved. Only uses cane for distances.    Wears reading glasses. Follows with Dr Ellin Mayhew ophthalmologist annually.   Would like handicap card completed.   Sleeping well. No exercise.   H/o RA- had improved on chemo therefore stopped not following with rheumatologist.      Dr Lucky Cowboy removed the port.  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 Neutropenia resolved.   HISTORY:  Past Medical History:  Diagnosis Date  . Arthritis   . Collagen vascular disease ([place])   . GERD (gastroesophageal reflux disease)   . Headache(784.0)   . History of methotrexate therapy   . Hyperlipidemia    hx  . Lymphadenopathy of head and neck 01/2015   see on Thyroid ultrasound  . Lymphoma, mantle cell ([place]) 06/01/2015   bx of lymph node in right breast/Stage IV Mantle Cell Lymphoma  . Personal history of chemotherapy   . Rheumatoid arthritis [facility])    Past Surgical History:  Procedure Laterality Date  . CARDIAC CATHETERIZATION  09/2004   [facility]; EF 60%  . CARDIAC CATHETERIZATION  08/2004   [facility]  . IR FLUORO GUIDED NEEDLE PLC ASPIRATION/INJECTION LOC  06/18/2018  . PERIPHERAL VASCULAR CATHETERIZATION N/A 07/04/2015   Procedure: [name] Insertion;  Surgeon: [name], MD;  Location: [place] [facility];  Service: Cardiovascular;  Laterality: N/A;  . PORTA CATH REMOVAL N/A 06/23/2018   Procedure: PORTA CATH REMOVAL;  Surgeon: [name], MD;  Location: [place] [facility];  Service: Cardiovascular;  Laterality: N/A;   Family History  Problem Relation Age of Onset  . Diabetes Mother   . Cholelithiasis Mother     . Hypertension Sister   . Diabetes Sister   . Arthritis Brother   . Breast cancer Neg Hx     Allergies: Patient has no known allergies. Current Outpatient Medications on File Prior to Visit  Medication Sig Dispense Refill  . acetaminophen (TYLENOL) 500 MG tablet Take 500 mg by mouth every 6 (six) hours as needed.    . Calcium Carbonate-Vit D-Min (CALTRATE 600+D PLUS) 600-800 MG-UNIT CHEW Chew 1 tablet by mouth 2 (two) times daily. 60 tablet 6  . calcium-vitamin D (OSCAL WITH D) 500-200 MG-UNIT tablet Take 1 tablet by mouth.    . cyanocobalamin 500 MCG tablet Take 500 mcg by mouth daily. Take two gummies daily    . fluticasone (FLONASE) 50 MCG/ACT nasal spray SPRAY TWICE IN EACH NOSTRIL ONCE DAILY  4  . lidocaine-prilocaine (EMLA) cream Apply 1 application topically as needed. 30 g 11  . meclizine (ANTIVERT) 25 MG tablet Take 25 mg by mouth 3 (three) times daily as needed for dizziness.    [name] MEGARED OMEGA-3 KRILL OIL 500 MG CAPS Take 500 mg by mouth daily.    . Multiple Vitamin (DAILY VITAMINS PO) Take 1 tablet by mouth.    . Multiple Vitamins-Minerals (MULTIVITAMIN GUMMIES ADULT) CHEW Chew by mouth. Reported on 09/06/2015    . Omega-3 1000 MG CAPS Take 1 capsule by mouth daily.     [name]  ondansetron (ZOFRAN) 8 MG tablet Take 1 tablet (8 mg total) by mouth every 8 (eight) hours as needed for nausea or vomiting (start 3 days; after chemo). 40 tablet 0  . pravastatin (PRAVACHOL) 40 MG tablet TAKE 1 TABLET BY MOUTH EVERY DAY 90 tablet 1  . PREVIDENT 0.2 % SOLN RINSE AFTER THOROUGH BRUSHING  10  . prochlorperazine (COMPAZINE) 10 MG tablet Take 1 tablet (10 mg total) by mouth every 6 (six) hours as needed for nausea or vomiting. 30 tablet 0  . VENTOLIN HFA 108 (90 Base) MCG/ACT inhaler TAKE 2 PUFFS BY MOUTH EVERY 6 HOURS AS NEEDED FOR WHEEZE OR SHORTNESS OF BREATH 18 Inhaler 5   Current Facility-Administered Medications on File Prior to Visit  Medication Dose Route Frequency Provider Last Rate Last  Dose  . sodium chloride flush (NS) 0.9 % injection 10 mL  10 mL Intravenous Once Cammie Sickle, MD      . Tbo-Filgrastim [facility]) injection 480 mcg  480 mcg Subcutaneous Once Cammie Sickle, MD        Social History   Tobacco Use  . Smoking status: Never Smoker  . Smokeless tobacco: Never Used  Substance Use Topics  . Alcohol use: No  . Drug use: No    Review of Systems  Constitutional: Negative for chills and fever.  Respiratory: Negative for cough.   Cardiovascular: Negative for chest pain and palpitations.  Gastrointestinal: Negative for nausea and vomiting.      Objective:    BP 118/68 (BP Location: Left Arm, Patient Position: Sitting, Cuff Size: Large)   Pulse 65   Temp 98 F (36.7 C)   Wt 253 lb 9.6 oz (115 kg)   SpO2 (!) 65%   BMI 39.72 kg/m  BP Readings from Last 3 Encounters:  06/28/18 118/68  06/23/18 119/63  06/18/18 133/88   Wt Readings from Last 3 Encounters:  06/28/18 253 lb 9.6 oz (115 kg)  06/23/18 253 lb (114.8 kg)  06/18/18 253 lb 4.8 oz (114.9 kg)    Physical Exam Vitals signs reviewed.  Constitutional:      Appearance: She is well-developed.  Eyes:     Conjunctiva/sclera: Conjunctivae normal.  Cardiovascular:     Rate and Rhythm: Normal rate and regular rhythm.     Pulses: Normal pulses.     Heart sounds: Normal heart sounds.  Pulmonary:     Effort: Pulmonary effort is normal.     Breath sounds: Normal breath sounds. No wheezing, rhonchi or rales.  Skin:    General: Skin is warm and dry.  Neurological:     Mental Status: She is alert.  Psychiatric:        Speech: Speech normal.        Behavior: Behavior normal.        Thought Content: Thought content normal.        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  Knee pain has improved.  Vision is acceptable.  She has no other complaints today.  Patient let me know if she were to feel that her ability to provide safe transport were to change at  any time; we could revisit this letter at that time.           I am having [name] maintain her CALTRATE 600+D PLUS MINERALS, MULTIVITAMIN GUMMIES ADULT, lidocaine-prilocaine, prochlorperazine, ondansetron, fluticasone, vitamin B-12, acetaminophen, PREVIDENT, meclizine, Omega-3, VENTOLIN HFA, pravastatin, Multiple Vitamin (DAILY VITAMINS PO), MEGARED OMEGA-3 KRILL OIL, and calcium-vitamin D.   No orders of the defined types were placed in this encounter.   Return precautions given.   Risks, benefits, and alternatives of the medications and treatment plan prescribed today were discussed, and patient expressed understanding.   Education regarding symptom management and diagnosis given to patient on AVS.  Continue to follow with Burnard Hawthorne, FNP for routine health maintenance.   [name] and I agreed with plan.   Mable Paris, FNP

## 2018-06-29 NOTE — Assessment & Plan Note (Signed)
Currently in remission, status post chemo.  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  Knee pain has improved.  Vision is acceptable.  She has no other complaints today.  Patient let me know if she were to feel that her ability to provide safe transport were to change at any time; we could revisit this letter at that time.

## 2018-07-14 DIAGNOSIS — H6981 Other specified disorders of Eustachian tube, right ear: Secondary | ICD-10-CM | POA: Diagnosis not present

## 2018-08-16 ENCOUNTER — Other Ambulatory Visit: Payer: Self-pay

## 2018-08-16 DIAGNOSIS — C8311 Mantle cell lymphoma, lymph nodes of head, face, and neck: Secondary | ICD-10-CM

## 2018-08-20 ENCOUNTER — Ambulatory Visit: Payer: Self-pay | Admitting: Internal Medicine

## 2018-08-20 ENCOUNTER — Other Ambulatory Visit: Payer: Self-pay

## 2018-09-17 ENCOUNTER — Telehealth: Payer: Self-pay | Admitting: *Deleted

## 2018-09-17 NOTE — Telephone Encounter (Signed)
Patient agreeable to video telehealth next week.  However, she needs to move her lab and telehealth apts to the afternoon- 215'sh for labs and 3 for Dr. Crissie Reese, please change apts per patient's request..

## 2018-09-23 ENCOUNTER — Telehealth: Payer: Self-pay | Admitting: Internal Medicine

## 2018-09-24 ENCOUNTER — Inpatient Hospital Stay (HOSPITAL_BASED_OUTPATIENT_CLINIC_OR_DEPARTMENT_OTHER): Payer: Medicare HMO | Admitting: Internal Medicine

## 2018-09-24 ENCOUNTER — Other Ambulatory Visit: Payer: Self-pay

## 2018-09-24 ENCOUNTER — Inpatient Hospital Stay: Payer: Medicare HMO | Attending: Internal Medicine

## 2018-09-24 DIAGNOSIS — C8311 Mantle cell lymphoma, lymph nodes of head, face, and neck: Secondary | ICD-10-CM | POA: Diagnosis not present

## 2018-09-24 LAB — COMPREHENSIVE METABOLIC PANEL
ALT: 13 U/L (ref 0–44)
AST: 21 U/L (ref 15–41)
Albumin: 3.9 g/dL (ref 3.5–5.0)
Alkaline Phosphatase: 82 U/L (ref 38–126)
Anion gap: 7 (ref 5–15)
BUN: 14 mg/dL (ref 8–23)
CO2: 28 mmol/L (ref 22–32)
Calcium: 8.9 mg/dL (ref 8.9–10.3)
Chloride: 107 mmol/L (ref 98–111)
Creatinine, Ser: 0.85 mg/dL (ref 0.44–1.00)
GFR calc Af Amer: >60 mL/min (ref >60)
GFR calc non Af Amer: >60 mL/min (ref >60)
Glucose, Bld: 106 mg/dL — ABNORMAL HIGH (ref 70–99)
Potassium: 4.3 mmol/L (ref 3.5–5.1)
Sodium: 142 mmol/L (ref 135–145)
Total Bilirubin: 0.6 mg/dL (ref 0.3–1.2)
Total Protein: 6.9 g/dL (ref 6.5–8.1)

## 2018-09-24 LAB — CBC WITH DIFFERENTIAL/PLATELET
Abs Immature Granulocytes: 0.02 10*3/uL (ref 0.00–0.07)
Basophils Absolute: 0 10*3/uL (ref 0.0–0.1)
Basophils Relative: 0 %
Eosinophils Absolute: 0.1 10*3/uL (ref 0.0–0.5)
Eosinophils Relative: 2 %
HCT: 42 % (ref 36.0–46.0)
Hemoglobin: 13.4 g/dL (ref 12.0–15.0)
Immature Granulocytes: 0 %
Lymphocytes Relative: 26 %
Lymphs Abs: 1.8 10*3/uL (ref 0.7–4.0)
MCH: 29.2 pg (ref 26.0–34.0)
MCHC: 31.9 g/dL (ref 30.0–36.0)
MCV: 91.5 fL (ref 80.0–100.0)
Monocytes Absolute: 0.5 10*3/uL (ref 0.1–1.0)
Monocytes Relative: 7 %
Neutro Abs: 4.6 10*3/uL (ref 1.7–7.7)
Neutrophils Relative %: 65 %
Platelets: 190 10*3/uL (ref 150–400)
RBC: 4.59 MIL/uL (ref 3.87–5.11)
RDW: 14.5 % (ref 11.5–15.5)
WBC: 7 10*3/uL (ref 4.0–10.5)
nRBC: 0 % (ref 0.0–0.2)

## 2018-09-24 LAB — LACTATE DEHYDROGENASE: LDH: 158 U/L (ref 98–192)

## 2018-09-24 NOTE — Progress Notes (Signed)
I connected with Michelle [name] on 09/24/18 at  3:00 PM EDT by video enabled telemedicine visit and verified that I am speaking with the correct person using two identifiers.  I discussed the limitations, risks, security and privacy concerns of performing an evaluation and management service by telemedicine and the availability of in-person appointments. I also discussed with the patient that there may be a patient responsible charge related to this service. The patient expressed understanding and agreed to proceed.    Other persons participating in the visit and their role in the encounter:none  Patient's location: home  Provider's location: home   Oncology History   # JAN 2017- MANTLE CELL LYMPHOMA STAGE IV; [R Breast LN [place] Core Bx-1.2cm LN/R Ax LN-Bx]; cyclin D Pos; Mitotic rate-LOW; MIPI score [5/intermediate risk]; BMBx-Positive for involvement. Feb 9th- START Benda-Ritux with neulasta; Prolonged neutropenia; DISCONT- Benda-Ritux;   # April 13 th 2017- START R-CHOP x1; severe/prolonged neutropenia; PET- CR; BMBx-Neg; Disc R-CHOP  # 26th MAY 2017- Start Rituxan q [address]M [address] OCT 12th 2017- PET NED.  Stop Rituxan maintenance x 2 years [aug 2019-pneumonia]  # Rheumatoid Arthritis [on MXT]; March 2017-MUGA scan-51 % --------------------------------------------------------    DIAGNOSIS: [jan 2017 ] Mantle cell lymphoma  STAGE: 4        ;GOALS: Control  CURRENT/MOST RECENT THERAPY: Surveillaince       Mantle cell lymphoma of lymph nodes of head, face, and neck ([place])     Chief Complaint: mantle cell lymphoma    History of present illness:Michelle [name] 78 y.o.  female with history of mantle cell lymphoma currently on surveillance.  Denies any unusual shortness of breath or cough.  No new lumps or bumps.  Appetite is good.  Observation/objective: hb:13; Creat- normal; LDH-158/normal.   Assessment and plan: Mantle cell lymphoma of lymph nodes of head, face, and neck  (HCC) #Mantle cell lymphoma stage IV-currently on surveillance [last Rituxan August 2019]; JAN 2020 CT- NED; except mild bil lung base scarring/see below.  #Continue surveillance per labs normal.  # Chronic arthritis-NSAIDs as needed.  Stable.  #  Bilateral lung base scarring- ? Chronic/stable.   # DISPOSITION: #  follow up in 3 months-MD /labs-cbc/cmp/ldh/port flush- Dr.B        Follow-up instructions:  I discussed the assessment and treatment plan with the patient.  The patient was provided an opportunity to ask questions and all were answered.  The patient agreed with the plan and demonstrated understanding of instructions.  The patient was advised to call back or seek an in person evaluation if the symptoms worsen or if the condition fails to improve as anticipated.  I provided 12  minutes of non face-to-face telephone visit time during this encounter, and > 50% was spent counseling as documented under my assessment & plan.   Dr. Charlaine Dalton [place] at [facility] 09/24/2018 3:05 PM

## 2018-09-24 NOTE — Assessment & Plan Note (Signed)
#  Mantle cell lymphoma stage IV-currently on surveillance [last Rituxan August 2019]; JAN 2020 CT- NED; except mild bil lung base scarring/see below.  #Continue surveillance per labs normal.  # Chronic arthritis-NSAIDs as needed.  Stable.  #  Bilateral lung base scarring- ? Chronic/stable.   # DISPOSITION: #  follow up in 3 months-MD /labs-cbc/cmp/ldh/port flush- Dr.B

## 2018-09-28 ENCOUNTER — Other Ambulatory Visit: Payer: Self-pay | Admitting: Family

## 2018-09-28 DIAGNOSIS — E785 Hyperlipidemia, unspecified: Secondary | ICD-10-CM

## 2018-10-19 ENCOUNTER — Other Ambulatory Visit: Payer: Self-pay | Admitting: Family

## 2018-11-12 ENCOUNTER — Encounter: Payer: Self-pay | Admitting: Family

## 2018-11-12 ENCOUNTER — Ambulatory Visit: Payer: Self-pay

## 2018-11-12 ENCOUNTER — Other Ambulatory Visit: Payer: Self-pay

## 2018-11-12 ENCOUNTER — Ambulatory Visit (INDEPENDENT_AMBULATORY_CARE_PROVIDER_SITE_OTHER): Payer: Medicare HMO

## 2018-11-12 ENCOUNTER — Ambulatory Visit (INDEPENDENT_AMBULATORY_CARE_PROVIDER_SITE_OTHER): Payer: Medicare HMO | Admitting: Family

## 2018-11-12 DIAGNOSIS — K219 Gastro-esophageal reflux disease without esophagitis: Secondary | ICD-10-CM | POA: Diagnosis not present

## 2018-11-12 DIAGNOSIS — R9389 Abnormal findings on diagnostic imaging of other specified body structures: Secondary | ICD-10-CM | POA: Diagnosis not present

## 2018-11-12 DIAGNOSIS — Z13820 Encounter for screening for osteoporosis: Secondary | ICD-10-CM | POA: Diagnosis not present

## 2018-11-12 DIAGNOSIS — Z0000 Encounter for general adult medical examination without abnormal findings: Secondary | ICD-10-CM | POA: Diagnosis not present

## 2018-11-12 DIAGNOSIS — E785 Hyperlipidemia, unspecified: Secondary | ICD-10-CM | POA: Diagnosis not present

## 2018-11-12 DIAGNOSIS — I700 Atherosclerosis of aorta: Secondary | ICD-10-CM

## 2018-11-12 DIAGNOSIS — I272 Pulmonary hypertension, unspecified: Secondary | ICD-10-CM

## 2018-11-12 NOTE — Progress Notes (Signed)
Subjective:   Michelle [name] is a 78 y.o. female who presents for Medicare Annual (Subsequent) preventive examination.  Review of Systems:  No ROS.  Medicare Wellness Virtual Visit.  Visual/audio telehealth visit, UTA vital signs.   See social history for additional risk factors.  Cardiac Risk Factors include: advanced age (>37men, >19 women);sedentary lifestyle     Objective:     Vitals: There were no vitals taken for this visit.  There is no height or weight on file to calculate BMI.  Advanced Directives 11/12/2018 06/23/2018 06/18/2018 04/16/2018 03/12/2018 02/12/2018 01/11/2018  Does Patient Have a Medical Advance Directive? No No No No No No No  Does patient want to make changes to medical advance directive? - - - - - - -  Would patient like information on creating a medical advance directive? No - Patient declined No - Patient declined No - Patient declined No - Patient declined No - Patient declined No - Patient declined No - Patient declined    Tobacco Social History   Tobacco Use  Smoking Status Never Smoker  Smokeless Tobacco Never Used     Counseling given: Not Answered   Clinical Intake:  Pre-visit preparation completed: Yes        Diabetes: No  How often do you need to have someone help you when you read instructions, pamphlets, or other written materials from your doctor or pharmacy?: 1 - Never  Interpreter Needed?: No     Past Medical History:  Diagnosis Date  . Arthritis   . Collagen vascular disease ([place])   . GERD (gastroesophageal reflux disease)   . Headache(784.0)   . History of methotrexate therapy   . Hyperlipidemia    hx  . Lymphadenopathy of head and neck 01/2015   see on Thyroid ultrasound  . Lymphoma, mantle cell ([place]) 06/01/2015   bx of lymph node in right breast/Stage IV Mantle Cell Lymphoma  . Personal history of chemotherapy   . Rheumatoid arthritis [facility])    Past Surgical History:  Procedure Laterality Date  . CARDIAC  CATHETERIZATION  09/2004   [facility]; EF 60%  . CARDIAC CATHETERIZATION  08/2004   [facility]  . IR FLUORO GUIDED NEEDLE PLC ASPIRATION/INJECTION LOC  06/18/2018  . PERIPHERAL VASCULAR CATHETERIZATION N/A 07/04/2015   Procedure: [name] Insertion;  Surgeon: [name], MD;  Location: [place] [facility];  Service: Cardiovascular;  Laterality: N/A;  . PORTA CATH REMOVAL N/A 06/23/2018   Procedure: PORTA CATH REMOVAL;  Surgeon: [name], MD;  Location: [place] [facility];  Service: Cardiovascular;  Laterality: N/A;   Family History  Problem Relation Age of Onset  . Diabetes Mother   . Cholelithiasis Mother   . Hypertension Sister   . Diabetes Sister   . Arthritis Brother   . Breast cancer Neg Hx    Social History   Socioeconomic History  . Marital status: Married    Spouse name: Not on file  . Number of children: Not on file  . Years of education: Not on file  . Highest education level: Not on file  Occupational History  . Not on file  Social Needs  . Financial resource strain: Not hard at all  . Food insecurity    Worry: Never true    Inability: Never true  . Transportation needs    Medical: No    Non-medical: No  Tobacco Use  . Smoking status: Never Smoker  . Smokeless tobacco: Never Used  Substance and  Sexual Activity  . Alcohol use: No  . Drug use: No  . Sexual activity: Never  Lifestyle  . Physical activity    Days per week: Not on file    Minutes per session: Not on file  . Stress: Not at all  Relationships  . Social Herbalist on phone: Not on file    Gets together: Not on file    Attends religious service: Not on file    Active member of club or organization: Not on file    Attends meetings of clubs or organizations: Not on file    Relationship status: Not on file  Other Topics Concern  . 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    Outpatient Encounter  Medications as of 11/12/2018  Medication Sig  . acetaminophen (TYLENOL) 500 MG tablet Take 500 mg by mouth every 6 (six) hours as needed.  . Calcium Carbonate-Vit D-Min (CALTRATE 600+D PLUS) 600-800 MG-UNIT CHEW Chew 1 tablet by mouth 2 (two) times daily.  . calcium-vitamin D (OSCAL WITH D) 500-200 MG-UNIT tablet Take 1 tablet by mouth.  . cyanocobalamin 500 MCG tablet Take 500 mcg by mouth daily. Take two gummies daily  . fluticasone (FLONASE) 50 MCG/ACT nasal spray SPRAY TWICE IN EACH NOSTRIL ONCE DAILY  . lidocaine-prilocaine (EMLA) cream Apply 1 application topically as needed.  . meclizine (ANTIVERT) 25 MG tablet Take 25 mg by mouth 3 (three) times daily as needed for dizziness.  [name] MEGARED OMEGA-3 KRILL OIL 500 MG CAPS Take 500 mg by mouth daily.  . Multiple Vitamin (DAILY VITAMINS PO) Take 1 tablet by mouth.  . Multiple Vitamins-Minerals (MULTIVITAMIN GUMMIES ADULT) CHEW Chew by mouth. Reported on 09/06/2015  . Omega-3 1000 MG CAPS Take 1 capsule by mouth daily.   [name] omeprazole (PRILOSEC) 40 MG capsule TAKE 1 CAPSULE BY MOUTH EVERY DAY  . ondansetron (ZOFRAN) 8 MG tablet Take 1 tablet (8 mg total) by mouth every 8 (eight) hours as needed for nausea or vomiting (start 3 days; after chemo).  . pravastatin (PRAVACHOL) 40 MG tablet TAKE 1 TABLET BY MOUTH EVERY DAY  . PREVIDENT 0.2 % SOLN RINSE AFTER THOROUGH BRUSHING  . prochlorperazine (COMPAZINE) 10 MG tablet Take 1 tablet (10 mg total) by mouth every 6 (six) hours as needed for nausea or vomiting.  . VENTOLIN HFA 108 (90 Base) MCG/ACT inhaler TAKE 2 PUFFS BY MOUTH EVERY 6 HOURS AS NEEDED FOR WHEEZE OR SHORTNESS OF BREATH   Facility-Administered Encounter Medications as of 11/12/2018  Medication  . sodium chloride flush (NS) 0.9 % injection 10 mL  . Tbo-Filgrastim (GRANIX) injection 480 mcg    Activities of Daily Living In your present state of health, do you have any difficulty performing the following activities: 11/12/2018 06/23/2018   Hearing? N N  Vision? N N  Difficulty concentrating or making decisions? N N  Walking or climbing stairs? N N  Comment She paces herself -  Dressing or bathing? N N  Doing errands, shopping? N -  Preparing Food and eating ? N -  Comment Uses a stool to sit as needed -  Using the Toilet? N -  In the past six months, have you accidently leaked urine? N -  Do you have problems with loss of bowel control? N -  Managing your Medications? N -  Managing your Finances? N -  Housekeeping or managing your Housekeeping? N -  Some recent data might be hidden    Patient Care Team: Burnard Hawthorne, FNP as PCP - General (Family Medicine) Jackolyn Confer, MD (Internal Medicine)    Assessment:   This is a routine wellness examination for [place].  I connected with patient 11/12/18 at 10:00 AM EDT by an audio enabled telemedicine application and verified that I am speaking with the correct person using two identifiers. Patient stated full name and DOB. Patient gave permission to continue with virtual visit. Patient's location was at home and Nurse's location was at [place] office.   Health Screenings  Mammogram - 04/2018 Colonoscopy - 11/2010 Bone Density - 10/2017 Glaucoma -none Hearing -demonstrates normal hearing during visit. Labs followed by pcp Dental- [facility]- visits within the last 12 months.  Social  Alcohol intake - no     Smoking history- never    Smokers in home? none Illicit drug use? none Exercise - bed stretches Diet - regular Sexually Active -never BMI- discussed the importance of a healthy diet, water intake and the benefits of aerobic exercise.  Educational material provided.   Safety  Patient feels safe at home- yes Patient does have smoke detectors at home- yes Patient does wear sunscreen or protective clothing when in direct sunlight -yes Patient does wear seat belt when in a moving vehicle -yes  Covid-19 precautions and sickness symptoms discussed.    Activities of Daily Living Patient denies needing assistance with: driving, household chores, feeding themselves, getting from bed to chair, getting to the toilet, bathing/showering, dressing, managing money, or preparing meals.  No new identified risk were noted.    Depression Screen Patient denies losing interest in daily life, feeling hopeless, or crying easily over simple problems.   Medication-taking as directed and without issues.   Fall Screen Patient denies being afraid of falling or falling in the last year.   Memory Screen Patient is alert.  Patient denies difficulty focusing, concentrating or misplacing items. Correctly identified the president of the [place], season and states the months of the year in reverse.  Patient likes to read and complete puzzles for brain stimulation.  Immunizations The following Immunizations were discussed: Influenza, shingles, pneumonia, and tetanus.   Other Providers Patient Care Team: Burnard Hawthorne, FNP as PCP - General (Family Medicine) Jackolyn Confer, MD (Internal Medicine)  Exercise Activities and Dietary recommendations Current Exercise Habits: Home exercise routine, Type of exercise: walking, Intensity: Mild  Goals      Patient Stated   . Increase physical activity (pt-stated)     Walk with husband for more exercise      Other   . Weight (lb) < 250 lb (113.4 kg)     Lose 10 pounds       Fall Risk Fall Risk  11/12/2018 11/09/2017 01/29/2017 11/07/2016 01/12/2014  Falls in the past year? 0 No No Yes No  Number falls in past yr: - - - 1 -  Injury with Fall? - - - Yes -  Comment - - - Twisted L ankle 2 months a dog.  Medical attention declined. -  Follow up - - - Education provided;Falls prevention discussed -   Depression Screen PHQ 2/9 Scores 11/12/2018 06/28/2018 11/09/2017 01/29/2017  PHQ - 2 Score 0 0 0 0  PHQ- 9 Score - 2 - -     Cognitive Function MMSE - Mini Mental State Exam 11/07/2016  Orientation to time 5   Orientation to Place  5  Registration 3  Attention/ Calculation 3  Attention/Calculation-comments Some difficulty calculating   Recall 3  Language- name 2 objects 2  Language- repeat 1  Language- follow 3 step command 3  Language- read & follow direction 1  Write a sentence 1  Copy design 1  Total score 28     6CIT Screen 11/12/2018 11/09/2017  What Year? 0 points 0 points  What month? 0 points 0 points  What time? 0 points 0 points  Count back from 20 0 points 0 points  Months in reverse - 4 points  Repeat phrase - 0 points  Total Score - 4    Immunization History  Administered Date(s) Administered  . Influenza Split 02/18/2012  . Influenza, High Dose Seasonal PF 01/29/2017, 02/19/2018  . Influenza,inj,Quad PF,6+ Mos 04/01/2013, 03/02/2014, 06/25/2015  . Influenza-Unspecified 03/14/2016  . Pneumococcal Conjugate-13 01/12/2014  . Pneumococcal Polysaccharide-23 11/12/2009  . Tdap 11/12/2009   Screening Tests Health Maintenance  Topic Date Due  . INFLUENZA VACCINE  01/01/2019  . TETANUS/TDAP  11/13/2019  . COLONOSCOPY  11/12/2020  . DEXA SCAN  Completed  . PNA vac Low Risk Adult  Completed      Plan:    End of life planning; Advanced aging; Advanced directives discussed.  No HCPOA/Living Will.  Additional information declined at this time.  I have personally reviewed and noted the following in the patient's chart:   . Medical and social history . Use of alcohol, tobacco or illicit drugs  . Current medications and supplements . Functional ability and status . Nutritional status . Physical activity . Advanced directives . List of other physicians . Hospitalizations, surgeries, and ER visits in previous 12 months . Vitals . Screenings to include cognitive, depression, and falls . Referrals and appointments  In addition, I have reviewed and discussed with patient certain preventive protocols, quality metrics, and best practice recommendations. A written  personalized care plan for preventive services as well as general preventive health recommendations were provided to patient.     Varney Biles, LPN  [id number] Agree with plan. Mable Paris, NP

## 2018-11-12 NOTE — Patient Instructions (Addendum)
  [name] , Thank you for taking time to come for your Medicare Wellness Visit. I appreciate your ongoing commitment to your health goals. Please review the following plan we discussed and let me know if I can assist you in the future.   These are the goals we discussed: Goals      Patient Stated   . Increase physical activity (pt-stated)     Walk with husband for more exercise      Other   . Weight (lb) < 250 lb (113.4 kg)     Lose 10 pounds       This is a list of the screening recommended for you and due dates:  Health Maintenance  Topic Date Due  . Flu Shot  01/01/2019  . Tetanus Vaccine  11/13/2019  . Colon Cancer Screening  11/12/2020  . DEXA scan (bone density measurement)  Completed  . Pneumonia vaccines  Completed

## 2018-11-12 NOTE — Assessment & Plan Note (Signed)
Suspect cough is from GERD. Will start omeprazole again with close follow up.

## 2018-11-12 NOTE — Patient Instructions (Addendum)
Fasting labs  Start prilosec again. I think this is why you are coughing. I would like you to be on this short term.  PLEASE read below  Today we discussed referrals, orders. Echocardiogram of heart   I have placed these orders in the system for you.  Please be sure to give [place] a call if you have not heard from our office regarding this. We should hear from [place] within ONE week with information regarding your appointment. If not, please let me know immediately.    Long term use beyond 3 months of proton pump inhibitors , also called PPI's, is associated with malabsorption of vitamins, chronic kidney disease, fracture risk, and diarrheal illnesses. PPI's include Nexium, Prilosec, Protonix, Dexilant, and Prevacid.   I generally recommend trying to control acid reflux with lifestyle modifications including avoiding trigger foods, not eating 2 hours prior to bedtime. You may use histamine 2 blockers daily to twice daily ( this is Pepcid) and then when symptoms flare, start back on PPI for short course. PLEASE NOTE: Zantac is a histamine blocker like Pepcid and the FDA has requested that Zantac be taken OFF THE MARKET. Please do NOT take this medication as there are concerns that it is cancer causing.   Of note, we will need to do an endoscopy ( upper GI) to evaluate your esophagus, stomach in the future if acid reflux persists are you develop red flag symptoms: trouble swallowing, hoarseness, chronic cough, unexplained weight loss.   Stay safe!

## 2018-11-12 NOTE — Assessment & Plan Note (Signed)
Pending lipid panel 

## 2018-11-12 NOTE — Assessment & Plan Note (Signed)
Atherosclerosis seen. Suspect Pulmonary HTN. She declines pulmonary consult for fibrosis seen. Pending echocardiogram. Will follow.

## 2018-11-12 NOTE — Progress Notes (Signed)
This visit type was conducted due to national recommendations for restrictions regarding the COVID-19 pandemic (e.g. social distancing).  This format is felt to be most appropriate for this patient at this time.  All issues noted in this document were discussed and addressed.  No physical exam was performed (except for noted visual exam findings with Video Visits). Virtual Visit via Video Note  I connected with@  on 11/12/18 at  2:30 PM EDT by a video enabled telemedicine application and verified that I am speaking with the correct person using two identifiers.  Location patient: home Location provider:work or home office Persons participating in the virtual visit: patient, provider  I discussed the limitations of evaluation and management by telemedicine and the availability of in person appointments. The patient expressed understanding and agreed to proceed.   HPI:  Feels well today. No complaints.  Back to work for past 3 weeks.   No cp, sob, wheezing.  Notes occasional dry cough, worse when laying down.  GERD- needs refill of omeprazole. Has epigastric burning. No vomiting or trouble swallowing. No h/o barrett's esophagus , or EGD per patient.   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   CT chest, ab and pelvis 06/2018 Atherosclerosis, pul HTN, fibrosis. No lymphadenopathy. Reviewed with patient today.   ROS: See pertinent positives and negatives per HPI.  Past Medical History:  Diagnosis Date  . Arthritis   . Collagen vascular disease ([place])   . GERD (gastroesophageal reflux disease)   . Headache(784.0)   . History of methotrexate therapy   . Hyperlipidemia    hx  . Lymphadenopathy of head and neck 01/2015   see on Thyroid ultrasound  . 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 Personal history of chemotherapy   . Rheumatoid arthritis [facility])     Past Surgical History:  Procedure  Laterality Date  . CARDIAC CATHETERIZATION  09/2004   [facility]; EF 60%  . CARDIAC CATHETERIZATION  08/2004   [facility]  . IR FLUORO GUIDED NEEDLE PLC ASPIRATION/INJECTION LOC  06/18/2018  . PERIPHERAL VASCULAR CATHETERIZATION N/A 07/04/2015   Procedure: [name] Insertion;  Surgeon: [name], MD;  Location: [place] [facility];  Service: Cardiovascular;  Laterality: N/A;  . PORTA CATH REMOVAL N/A 06/23/2018   Procedure: PORTA CATH REMOVAL;  Surgeon: [name], MD;  Location: [place] [facility];  Service: Cardiovascular;  Laterality: N/A;    Family History  Problem Relation Age of Onset  . Diabetes Mother   . Cholelithiasis Mother   . Hypertension Sister   . Diabetes Sister   . Arthritis Brother   . Breast cancer Neg Hx     SOCIAL HX: never smoker  Current Outpatient Medications:  .  acetaminophen (TYLENOL) 500 MG tablet, Take 500 mg by mouth every 6 (six) hours as needed., Disp: , Rfl:  .  Calcium Carbonate-Vit D-Min (CALTRATE 600+D PLUS) 600-800 MG-UNIT CHEW, Chew 1 tablet by mouth 2 (two) times daily., Disp: 60 tablet, Rfl: 6 .  cyanocobalamin 500 MCG tablet, Take 500 mcg by mouth daily. Take two gummies daily, Disp: , Rfl:  .  fluticasone (FLONASE) 50 MCG/ACT nasal spray, SPRAY TWICE IN EACH NOSTRIL ONCE DAILY, Disp: , Rfl: 4 .  lidocaine-prilocaine (EMLA) cream, Apply 1 application topically as needed., Disp: 30 g, Rfl: 11 .  meclizine (ANTIVERT) 25 MG tablet, Take 25 mg by mouth 3 (three) times daily  as needed for dizziness., Disp: , Rfl:  .  MEGARED OMEGA-3 KRILL OIL 500 MG CAPS, Take 500 mg by mouth daily., Disp: , Rfl:  .  Multiple Vitamins-Minerals (MULTIVITAMIN GUMMIES ADULT) CHEW, Chew by mouth. Reported on 09/06/2015, Disp: , Rfl:  .  Omega-3 1000 MG CAPS, Take 1 capsule by mouth daily. , Disp: , Rfl:  .  omeprazole (PRILOSEC) 40 MG capsule, TAKE 1 CAPSULE BY MOUTH EVERY DAY, Disp: 30 capsule, Rfl: 0 .  pravastatin (PRAVACHOL) 40 MG tablet, TAKE 1 TABLET BY MOUTH EVERY DAY,  Disp: 90 tablet, Rfl: 1 .  PREVIDENT 0.2 % SOLN, RINSE AFTER THOROUGH BRUSHING, Disp: , Rfl: 10 .  VENTOLIN HFA 108 (90 Base) MCG/ACT inhaler, TAKE 2 PUFFS BY MOUTH EVERY 6 HOURS AS NEEDED FOR WHEEZE OR SHORTNESS OF BREATH (Patient not taking: Reported on 11/12/2018), Disp: 18 Inhaler, Rfl: 5 No current facility-administered medications for this visit.   Facility-Administered Medications Ordered in Other Visits:  .  sodium chloride flush (NS) 0.9 % injection 10 mL, 10 mL, Intravenous, Once, Brahmanday, Elisha Headland, MD .  Tbo-Filgrastim [facility]) injection 480 mcg, 480 mcg, Subcutaneous, Once, Brahmanday, Elisha Headland, MD  EXAM:  VITALS per patient if applicable:  GENERAL: alert, oriented, appears well and in no acute distress  HEENT: atraumatic, conjunttiva clear, no obvious abnormalities on inspection of external nose and ears  NECK: normal movements of the head and neck  LUNGS: on inspection no signs of respiratory distress, breathing rate appears normal, no obvious gross SOB, gasping or wheezing  CV: no obvious cyanosis  MS: moves all visible extremities without noticeable abnormality  PSYCH/NEURO: pleasant and cooperative, no obvious depression or anxiety, speech and thought processing grossly intact  ASSESSMENT AND PLAN:  Discussed the following assessment and plan:   Problem List Items Addressed This Visit      Digestive   GERD (gastroesophageal reflux disease)    Suspect cough is from GERD. Will start omeprazole again with close follow up.         Other   Screening for osteoporosis   Relevant Orders   VITAMIN D 25 Hydroxy (Vit-D Deficiency, Fractures)   TSH   HLD (hyperlipidemia)    Pending lipid panel.       Abnormal CT scan, chest    Atherosclerosis seen. Suspect Pulmonary HTN. She declines pulmonary consult for fibrosis seen. Pending echocardiogram. Will follow.        Other Visit Diagnoses    Abnormal CT of the chest    -  Primary   Relevant Orders    ECHOCARDIOGRAM COMPLETE   Lipid panel   Pulmonary HTN ([place])       Relevant Orders   ECHOCARDIOGRAM COMPLETE   Atherosclerosis of aorta (HCC)       Relevant Orders   Lipid panel        I discussed the assessment and treatment plan with the patient. The patient was provided an opportunity to ask questions and all were answered. The patient agreed with the plan and demonstrated an understanding of the instructions.   The patient was advised to call back or seek an in-person evaluation if the symptoms worsen or if the condition fails to improve as anticipated.   Mable Paris, FNP

## 2018-11-15 NOTE — Progress Notes (Signed)
Labs scheduled & AVS mailed.

## 2018-11-19 ENCOUNTER — Other Ambulatory Visit: Payer: Self-pay | Admitting: Family

## 2018-12-02 ENCOUNTER — Ambulatory Visit: Payer: Medicare HMO

## 2018-12-16 ENCOUNTER — Other Ambulatory Visit: Payer: Self-pay | Admitting: Family

## 2018-12-16 ENCOUNTER — Ambulatory Visit
Admission: RE | Admit: 2018-12-16 | Discharge: 2018-12-16 | Disposition: A | Discharge status: Home / Self Care | Payer: Medicare HMO | Source: Ambulatory Visit | Attending: Family | Admitting: Family

## 2018-12-16 ENCOUNTER — Other Ambulatory Visit: Payer: Self-pay

## 2018-12-16 DIAGNOSIS — R9389 Abnormal findings on diagnostic imaging of other specified body structures: Secondary | ICD-10-CM | POA: Diagnosis not present

## 2018-12-16 DIAGNOSIS — E785 Hyperlipidemia, unspecified: Secondary | ICD-10-CM | POA: Insufficient documentation

## 2018-12-16 DIAGNOSIS — Z9221 Personal history of antineoplastic chemotherapy: Secondary | ICD-10-CM | POA: Insufficient documentation

## 2018-12-16 DIAGNOSIS — K219 Gastro-esophageal reflux disease without esophagitis: Secondary | ICD-10-CM | POA: Insufficient documentation

## 2018-12-16 DIAGNOSIS — I272 Pulmonary hypertension, unspecified: Secondary | ICD-10-CM | POA: Insufficient documentation

## 2018-12-16 NOTE — Progress Notes (Signed)
*  PRELIMINARY RESULTS* Echocardiogram 2D Echocardiogram has been performed.  Sherrie Sport 12/16/2018, 11:36 AM

## 2018-12-17 ENCOUNTER — Encounter: Payer: Self-pay | Admitting: Family

## 2018-12-17 ENCOUNTER — Ambulatory Visit (INDEPENDENT_AMBULATORY_CARE_PROVIDER_SITE_OTHER): Payer: Medicare HMO | Admitting: Family

## 2018-12-17 ENCOUNTER — Other Ambulatory Visit (INDEPENDENT_AMBULATORY_CARE_PROVIDER_SITE_OTHER): Payer: Medicare HMO

## 2018-12-17 ENCOUNTER — Other Ambulatory Visit: Payer: Self-pay

## 2018-12-17 VITALS — Ht 67.01 in | Wt 253.6 lb

## 2018-12-17 DIAGNOSIS — R9389 Abnormal findings on diagnostic imaging of other specified body structures: Secondary | ICD-10-CM

## 2018-12-17 DIAGNOSIS — I700 Atherosclerosis of aorta: Secondary | ICD-10-CM

## 2018-12-17 DIAGNOSIS — Z13820 Encounter for screening for osteoporosis: Secondary | ICD-10-CM

## 2018-12-17 DIAGNOSIS — R0602 Shortness of breath: Secondary | ICD-10-CM | POA: Insufficient documentation

## 2018-12-17 DIAGNOSIS — R0609 Other forms of dyspnea: Secondary | ICD-10-CM | POA: Insufficient documentation

## 2018-12-17 DIAGNOSIS — R0600 Dyspnea, unspecified: Secondary | ICD-10-CM | POA: Insufficient documentation

## 2018-12-17 DIAGNOSIS — M2550 Pain in unspecified joint: Secondary | ICD-10-CM | POA: Insufficient documentation

## 2018-12-17 LAB — LIPID PANEL
Cholesterol: 142 mg/dL (ref 0–200)
HDL: 61.4 mg/dL (ref >39.00)
LDL Cholesterol: 71 mg/dL (ref 0–99)
NonHDL: 80.55
Total CHOL/HDL Ratio: 2
Triglycerides: 49 mg/dL (ref 0.0–149.0)
VLDL: 9.8 mg/dL (ref 0.0–40.0)

## 2018-12-17 LAB — TSH: TSH: 2.41 u[IU]/mL (ref 0.35–4.50)

## 2018-12-17 LAB — VITAMIN D 25 HYDROXY (VIT D DEFICIENCY, FRACTURES): VITD: 35.75 ng/mL (ref 30.00–100.00)

## 2018-12-17 MED ORDER — MELOXICAM 7.5 MG PO TABS: 7.5000 mg | ORAL_TABLET | Freq: Every day | ORAL | 0 refills | Status: DC | PRN

## 2018-12-17 MED ORDER — DICLOFENAC SODIUM 1 % TD GEL: 4.0000 g | Freq: Four times a day (QID) | TRANSDERMAL | 1 refills | Status: DC

## 2018-12-17 MED ORDER — LIDOCAINE 5 % EX PTCH: 1.0000 | MEDICATED_PATCH | CUTANEOUS | 1 refills | Status: DC

## 2018-12-17 NOTE — Assessment & Plan Note (Addendum)
Cough resolved. Concern regarding shortness of breath, orthopnea and decreased ejection fraction with CHF (likely after chemo contributory.)  Patient agreed on cardiology referral. Will follow

## 2018-12-17 NOTE — Patient Instructions (Signed)
Today we discussed referrals, orders. cardiology   I have placed these orders in the system for you.  Please be sure to give [place] a call if you have not heard from our office regarding this. We should hear from [place] within ONE week with information regarding your appointment. If not, please let me know immediately.   Trial of mobic, lidocaine patch, voltaren gel  A couple of points in regards to meloxicam ( Mobic) which you are on.   This medication is not intended for daily , long term use. It is a potent anti inflammatory ( NSAID), and my intention is for you take as needed for moderate to severe pain. If you find yourself using daily, please let me know.   Please takes Mobic ( meloxicam) with FOOD since it is an anti-inflammatory as it can cause a GI bleed or ulcer. If you have a history of GI bleed or ulcer, please do NOT take.  Do no take over the counter aleve, motrin, advil, goody's powder for pain as they are also NSAIDs, and they are  in the same class as Mobic  Lastly, we will need to monitor kidney function while on Mobic wer, and if we were to see any decline in kidney function in the future, we would have to discontinue this medication.    Let me know how joint pain responds to the above.

## 2018-12-17 NOTE — Assessment & Plan Note (Signed)
Acute on chronic. Patient has history of RA however complaining of more on weightbearing joints low back, knees. ? OA Pending X-rays in particular with her history of lymphoma. Trial of mobic ( advised short term), voltaren gel, lidocaine. Patient will let me know how she is doing

## 2018-12-17 NOTE — Progress Notes (Signed)
This visit type was conducted due to national recommendations for restrictions regarding the COVID-19 pandemic (e.g. social distancing).  This format is felt to be most appropriate for this patient at this time.  All issues noted in this document were discussed and addressed.  No physical exam was performed (except for noted visual exam findings with Video Visits). Virtual Visit via Video Note  I connected with@  on 12/17/18 at  3:00 PM EDT by a video enabled telemedicine application and verified that I am speaking with the correct person using two identifiers.  Location patient: home Location provider:work  Persons participating in the virtual visit: patient, provider  I discussed the limitations of evaluation and management by telemedicine and the availability of in person appointments. The patient expressed understanding and agreed to proceed.   HPI:  Cough has resolved. No leg swelling.   'Some' SOB with walking, orthopnea. ( sleeping with elevated bed). She feels like she is out of shape since pandemic.   GERD- cough has resolved. Not on prilosec.   HLD- on pravastatin  Stopped chemo within last 12 months ( last 12/2017)  for lymphoma  Concern for pulmonary HTN.   Echo shows EF 50-55%  RA- follows with Dr Jefm Bryant however would like prednisone for knees, low back. Worse with long periods of standing such as washing dishes. Noticed several months ago after stopped chemo. Comes and goes.  Eases off with tylenol, BC Powder. No h/o GIB.   Using cane.No groin pain. This is chronic. No injury, falls, numbness, saddle anesthesia.   No joint swelling, redness.   ROS: See pertinent positives and negatives per HPI.  Past Medical History:  Diagnosis Date  . Arthritis   . Collagen vascular disease ([place])   . GERD (gastroesophageal reflux disease)   . Headache(784.0)   . History of methotrexate therapy   . Hyperlipidemia    hx  . Lymphadenopathy of head and neck 01/2015   see on  Thyroid ultrasound  . 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 Personal history of chemotherapy   . Rheumatoid arthritis [facility])     Past Surgical History:  Procedure Laterality Date  . CARDIAC CATHETERIZATION  09/2004   [facility]; EF 60%  . CARDIAC CATHETERIZATION  08/2004   [facility]  . IR FLUORO GUIDED NEEDLE PLC ASPIRATION/INJECTION LOC  06/18/2018  . PERIPHERAL VASCULAR CATHETERIZATION N/A 07/04/2015   Procedure: [name] Insertion;  Surgeon: [name], MD;  Location: [place] [facility];  Service: Cardiovascular;  Laterality: N/A;  . PORTA CATH REMOVAL N/A 06/23/2018   Procedure: PORTA CATH REMOVAL;  Surgeon: [name], MD;  Location: [place] [facility];  Service: Cardiovascular;  Laterality: N/A;    Family History  Problem Relation Age of Onset  . Diabetes Mother   . Cholelithiasis Mother   . Hypertension Sister   . Diabetes Sister   . Arthritis Brother   . Breast cancer Neg Hx     SOCIAL HX: never smoker   Current Outpatient Medications:  .  acetaminophen (TYLENOL) 500 MG tablet, Take 500 mg by mouth every 6 (six) hours as needed., Disp: , Rfl:  .  Calcium Carbonate-Vit D-Min (CALTRATE 600+D PLUS) 600-800 MG-UNIT CHEW, Chew 1 tablet by mouth 2 (two) times daily., Disp: 60 tablet, Rfl: 6 .  cyanocobalamin 500 MCG tablet, Take 500 mcg by mouth daily. Take two gummies daily, Disp: , Rfl:  .  fluticasone (FLONASE) 50 MCG/ACT nasal spray, SPRAY TWICE IN EACH NOSTRIL ONCE DAILY, Disp: , Rfl: 4 .  lidocaine-prilocaine (EMLA) cream, Apply 1 application topically as needed., Disp: 30 g, Rfl: 11 .  MEGARED OMEGA-3 KRILL OIL 500 MG CAPS, Take 500 mg by mouth daily., Disp: , Rfl:  .  Multiple Vitamins-Minerals (MULTIVITAMIN GUMMIES ADULT) CHEW, Chew by mouth. Reported on 09/06/2015, Disp: , Rfl:  .  Omega-3 1000 MG CAPS, Take 1 capsule by mouth daily. , Disp: , Rfl:  .  pravastatin (PRAVACHOL) 40 MG tablet, TAKE 1 TABLET BY  MOUTH EVERY DAY, Disp: 90 tablet, Rfl: 1 .  VENTOLIN HFA 108 (90 Base) MCG/ACT inhaler, TAKE 2 PUFFS BY MOUTH EVERY 6 HOURS AS NEEDED FOR WHEEZE OR SHORTNESS OF BREATH, Disp: 18 Inhaler, Rfl: 5 .  diclofenac sodium (VOLTAREN) 1 % GEL, Apply 4 g topically 4 (four) times daily., Disp: 100 g, Rfl: 1 .  lidocaine (LIDODERM) 5 %, Place 1 patch onto the skin daily. Remove & Discard patch within 12 hours., Disp: 30 patch, Rfl: 1 .  meclizine (ANTIVERT) 25 MG tablet, Take 25 mg by mouth 3 (three) times daily as needed for dizziness., Disp: , Rfl:  .  meloxicam (MOBIC) 7.5 MG tablet, Take 1 tablet (7.5 mg total) by mouth daily as needed for pain., Disp: 30 tablet, Rfl: 0 .  PREVIDENT 0.2 % SOLN, RINSE AFTER THOROUGH BRUSHING, Disp: , Rfl: 10 No current facility-administered medications for this visit.   Facility-Administered Medications Ordered in Other Visits:  .  sodium chloride flush (NS) 0.9 % injection 10 mL, 10 mL, Intravenous, Once, Brahmanday, Elisha Headland, MD .  Tbo-Filgrastim [facility]) injection 480 mcg, 480 mcg, Subcutaneous, Once, Brahmanday, Elisha Headland, MD  EXAM:  VITALS per patient if applicable:  GENERAL: alert, oriented, appears well and in no acute distress  HEENT: atraumatic, conjunttiva clear, no obvious abnormalities on inspection of external nose and ears  NECK: normal movements of the head and neck  LUNGS: on inspection no signs of respiratory distress, breathing rate appears normal, no obvious gross SOB, gasping or wheezing  CV: no obvious cyanosis  MS: moves all visible extremities without noticeable abnormality  PSYCH/NEURO: pleasant and cooperative, no obvious depression or anxiety, speech and thought processing grossly intact  ASSESSMENT AND PLAN:  Discussed the following assessment and plan:  Problem List Items Addressed This Visit      Other   SOB (shortness of breath) - Primary    Cough resolved. Concern regarding shortness of breath, orthopnea and decreased  ejection fraction with CHF (likely after chemo contributory.)  Patient agreed on cardiology referral. Will follow      Relevant Orders   Ambulatory referral to Cardiology   Arthralgia    Acute on chronic. Patient has history of RA however complaining of more on weightbearing joints low back, knees. ? OA Pending X-rays in particular with her history of lymphoma. Trial of mobic ( advised short term), voltaren gel, lidocaine. Patient will let me know how she is doing      Relevant Medications   meloxicam (MOBIC) 7.5 MG tablet   diclofenac sodium (VOLTAREN) 1 % GEL   lidocaine (LIDODERM) 5 %   Other Relevant Orders   DG Lumbar Spine Complete   DG Knee Complete 4 Views Left   DG Knee Complete 4 Views Right      I discussed the assessment and treatment plan with the patient. The patient was provided an opportunity to ask questions and all were  answered. The patient agreed with the plan and demonstrated an understanding of the instructions.   The patient was advised to call back or seek an in-person evaluation if the symptoms worsen or if the condition fails to improve as anticipated.   Mable Paris, FNP

## 2018-12-23 ENCOUNTER — Other Ambulatory Visit: Payer: Self-pay

## 2018-12-24 ENCOUNTER — Other Ambulatory Visit: Payer: Self-pay

## 2018-12-24 ENCOUNTER — Inpatient Hospital Stay (HOSPITAL_BASED_OUTPATIENT_CLINIC_OR_DEPARTMENT_OTHER): Payer: Medicare HMO | Admitting: Internal Medicine

## 2018-12-24 ENCOUNTER — Inpatient Hospital Stay: Payer: Medicare HMO | Attending: Internal Medicine

## 2018-12-24 ENCOUNTER — Encounter: Payer: Self-pay | Admitting: Internal Medicine

## 2018-12-24 ENCOUNTER — Other Ambulatory Visit: Payer: Self-pay | Admitting: *Deleted

## 2018-12-24 VITALS — BP 122/66 | HR 70 | Temp 97.8°F | Resp 20 | Ht 67.01 in | Wt 254.0 lb

## 2018-12-24 DIAGNOSIS — C8311 Mantle cell lymphoma, lymph nodes of head, face, and neck: Secondary | ICD-10-CM

## 2018-12-24 DIAGNOSIS — Z95828 Presence of other vascular implants and grafts: Secondary | ICD-10-CM

## 2018-12-24 LAB — CBC WITH DIFFERENTIAL/PLATELET
Abs Immature Granulocytes: 0.05 10*3/uL (ref 0.00–0.07)
Basophils Absolute: 0 10*3/uL (ref 0.0–0.1)
Basophils Relative: 0 %
Eosinophils Absolute: 0.2 10*3/uL (ref 0.0–0.5)
Eosinophils Relative: 2 %
HCT: 43.6 % (ref 36.0–46.0)
Hemoglobin: 13.8 g/dL (ref 12.0–15.0)
Immature Granulocytes: 1 %
Lymphocytes Relative: 26 %
Lymphs Abs: 2 10*3/uL (ref 0.7–4.0)
MCH: 28.8 pg (ref 26.0–34.0)
MCHC: 31.7 g/dL (ref 30.0–36.0)
MCV: 90.8 fL (ref 80.0–100.0)
Monocytes Absolute: 0.5 10*3/uL (ref 0.1–1.0)
Monocytes Relative: 6 %
Neutro Abs: 5 10*3/uL (ref 1.7–7.7)
Neutrophils Relative %: 65 %
Platelets: 197 10*3/uL (ref 150–400)
RBC: 4.8 MIL/uL (ref 3.87–5.11)
RDW: 14.7 % (ref 11.5–15.5)
WBC: 7.7 10*3/uL (ref 4.0–10.5)
nRBC: 0 % (ref 0.0–0.2)

## 2018-12-24 LAB — COMPREHENSIVE METABOLIC PANEL
ALT: 13 U/L (ref 0–44)
AST: 22 U/L (ref 15–41)
Albumin: 4 g/dL (ref 3.5–5.0)
Alkaline Phosphatase: 79 U/L (ref 38–126)
Anion gap: 10 (ref 5–15)
BUN: 16 mg/dL (ref 8–23)
CO2: 24 mmol/L (ref 22–32)
Calcium: 8.9 mg/dL (ref 8.9–10.3)
Chloride: 105 mmol/L (ref 98–111)
Creatinine, Ser: 0.64 mg/dL (ref 0.44–1.00)
GFR calc Af Amer: >60 mL/min (ref >60)
GFR calc non Af Amer: >60 mL/min (ref >60)
Glucose, Bld: 101 mg/dL — ABNORMAL HIGH (ref 70–99)
Potassium: 3.9 mmol/L (ref 3.5–5.1)
Sodium: 139 mmol/L (ref 135–145)
Total Bilirubin: 0.8 mg/dL (ref 0.3–1.2)
Total Protein: 7 g/dL (ref 6.5–8.1)

## 2018-12-24 LAB — LACTATE DEHYDROGENASE: LDH: 178 U/L (ref 98–192)

## 2018-12-24 MED ORDER — SODIUM CHLORIDE 0.9% FLUSH
10.0000 mL | Freq: Once | INTRAVENOUS | Status: AC
  Filled 2018-12-24: qty 10

## 2018-12-24 MED ORDER — HEPARIN SOD (PORK) LOCK FLUSH 100 UNIT/ML IV SOLN: 500.0000 [IU] | Freq: Once | INTRAVENOUS | Status: AC

## 2018-12-24 NOTE — Progress Notes (Signed)
[facility] OFFICE PROGRESS NOTE  Patient Care Team: Burnard Hawthorne, FNP as PCP - General (Family Medicine) Jackolyn Confer, MD (Internal Medicine)  Cancer Staging No matching staging information was found for the patient.   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 Feb 9th- START Benda-Ritux with neulasta; Prolonged neutropenia; DISCONT- Benda-Ritux;   # April 13 th 2017- START R-CHOP x1; severe/prolonged neutropenia; PET- CR; BMBx-Neg; Disc R-CHOP  # 26th MAY 2017- Start Rituxan q 28M Main OCT 12th 2017- PET NED.  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  female pleasant patient above history of mantle cell lymphoma currently on surveillance is here for follow-up.  Patient denies any weight loss but denies any nausea vomiting.  Denies any night sweats.  No fevers or chills.  No headaches.  She was recently evaluated by PCP-noted to have mildly low ejection fraction 50 to 55%.   Review of Systems  Constitutional: Negative for chills, diaphoresis, fever, malaise/fatigue and weight loss.  HENT: Negative for nosebleeds and sore throat.   Eyes: Negative for double vision.  Respiratory: Negative for hemoptysis, sputum production and wheezing.   Cardiovascular: Negative for chest pain, palpitations, orthopnea and leg swelling.  Gastrointestinal: Negative for abdominal pain, blood in stool, constipation, diarrhea,  heartburn, melena, nausea and vomiting.  Genitourinary: Negative for dysuria, frequency and urgency.  Musculoskeletal: Negative for back pain and joint pain.  Skin: Negative.  Negative for itching and rash.  Neurological: Negative for dizziness, tingling, focal weakness, weakness and headaches.  Endo/Heme/Allergies: Does not bruise/bleed easily.  Psychiatric/Behavioral: Negative for depression. The patient is not nervous/anxious and does not have insomnia.       PAST MEDICAL HISTORY :  Past Medical History:  Diagnosis Date  . Arthritis   . Collagen vascular disease ([place])   . GERD (gastroesophageal reflux disease)   . Headache(784.0)   . History of methotrexate therapy   . Hyperlipidemia    hx  . Lymphadenopathy of head and neck 01/2015   see on Thyroid ultrasound  . Lymphoma, mantle cell ([place]) 06/01/2015   bx of lymph node in right breast/Stage IV Mantle Cell Lymphoma  . Personal history of chemotherapy   . Rheumatoid arthritis ([place])     PAST SURGICAL HISTORY :   Past Surgical History:  Procedure Laterality Date  . CARDIAC CATHETERIZATION  09/2004   [facility]; EF 60%  . CARDIAC CATHETERIZATION  08/2004   [facility]  . IR FLUORO GUIDED NEEDLE PLC ASPIRATION/INJECTION LOC  06/18/2018  . PERIPHERAL VASCULAR CATHETERIZATION N/A 07/04/2015   Procedure: [name] Insertion;  Surgeon: [name], MD;  Location: [facility];  Service: Cardiovascular;  Laterality: N/A;  . PORTA CATH REMOVAL N/A 06/23/2018   Procedure: PORTA CATH REMOVAL;  Surgeon: [name], MD;  Location: [facility];  Service: Cardiovascular;  Laterality: N/A;    FAMILY HISTORY :   Family History  Problem Relation Age  of Onset  . Diabetes Mother   . Cholelithiasis Mother   . Hypertension Sister   . Diabetes Sister   . Arthritis Brother   . Breast cancer Neg Hx     SOCIAL HISTORY:   Social History   Tobacco Use  . Smoking status: Never Smoker  . Smokeless tobacco: Never Used  Substance Use  Topics  . Alcohol use: No  . Drug use: No    ALLERGIES:  has No Known Allergies.  MEDICATIONS:  Current Outpatient Medications  Medication Sig Dispense Refill  . acetaminophen (TYLENOL) 500 MG tablet Take 500 mg by mouth every 6 (six) hours as needed.    . Calcium Carbonate-Vit D-Min (CALTRATE 600+D PLUS) 600-800 MG-UNIT CHEW Chew 1 tablet by mouth 2 (two) times daily. 60 tablet 6  . cyanocobalamin 500 MCG tablet Take 500 mcg by mouth daily. Take two gummies daily    . diclofenac sodium (VOLTAREN) 1 % GEL Apply 4 g topically 4 (four) times daily. 100 g 1  . fluticasone (FLONASE) 50 MCG/ACT nasal spray SPRAY TWICE IN EACH NOSTRIL ONCE DAILY  4  . lidocaine (LIDODERM) 5 % Place 1 patch onto the skin daily. Remove & Discard patch within 12 hours. 30 patch 1  . meclizine (ANTIVERT) 25 MG tablet Take 25 mg by mouth 3 (three) times daily as needed for dizziness.    [name] MEGARED OMEGA-3 KRILL OIL 500 MG CAPS Take 500 mg by mouth daily.    . meloxicam (MOBIC) 7.5 MG tablet Take 1 tablet (7.5 mg total) by mouth daily as needed for pain. 30 tablet 0  . Multiple Vitamins-Minerals (MULTIVITAMIN GUMMIES ADULT) CHEW Chew by mouth. Reported on 09/06/2015    . Omega-3 1000 MG CAPS Take 1 capsule by mouth daily.     [name] omeprazole (PRILOSEC) 40 MG capsule Take 1 capsule by mouth daily.    . pravastatin (PRAVACHOL) 40 MG tablet TAKE 1 TABLET BY MOUTH EVERY DAY 90 tablet 1  . VENTOLIN HFA 108 (90 Base) MCG/ACT inhaler TAKE 2 PUFFS BY MOUTH EVERY 6 HOURS AS NEEDED FOR WHEEZE OR SHORTNESS OF BREATH 18 Inhaler 5   No current facility-administered medications for this visit.    Facility-Administered Medications Ordered in Other Visits  Medication Dose Route Frequency Provider Last Rate Last Dose  . heparin lock flush 100 unit/mL  500 Units Intravenous Once Charlaine Dalton R, MD      . sodium chloride flush (NS) 0.9 % injection 10 mL  10 mL Intravenous Once Charlaine Dalton R, MD      . sodium chloride flush  (NS) 0.9 % injection 10 mL  10 mL Intravenous Once Cammie Sickle, MD      . Tbo-Filgrastim [facility]) injection 480 mcg  480 mcg Subcutaneous Once Cammie Sickle, MD        PHYSICAL EXAMINATION: ECOG PERFORMANCE STATUS: 0 - Asymptomatic  BP 122/66   Pulse 70   Temp 97.8 F (36.6 C) (Tympanic)   Resp 20   Ht 5' 7.01" (1.702 m)   Wt 254 lb (115.2 kg)   BMI 39.77 kg/m   Filed Weights   12/24/18 0956  Weight: 254 lb (115.2 kg)    Physical Exam  Constitutional: She is oriented to person, place, and time and well-developed, well-nourished, and in no distress.  Obese.  Walking herself with cane.   HENT:  Head: Normocephalic and atraumatic.  Mouth/Throat: Oropharynx is clear and moist. No oropharyngeal exudate.  Eyes: Pupils are equal, round,  and reactive to light.  Neck: Normal range of motion. Neck supple.  Cardiovascular: Normal rate and regular rhythm.  Pulmonary/Chest: No respiratory distress. She has no wheezes.  Abdominal: Soft. Bowel sounds are normal. She exhibits no distension and no mass. There is no abdominal tenderness. There is no rebound and no guarding.  Musculoskeletal: Normal range of motion.        General: No tenderness or edema.  Neurological: She is alert and oriented to person, place, and time.  Skin: Skin is warm.  Psychiatric: Affect normal.   LABORATORY DATA:  I have reviewed the data as listed    Component Value Date/Time   NA 139 12/24/2018 0939   NA 142 09/14/2013 1127   K 3.9 12/24/2018 0939   K 3.5 09/14/2013 1127   CL 105 12/24/2018 0939   CL 110 (H) 09/14/2013 1127   CO2 24 12/24/2018 0939   CO2 29 09/14/2013 1127   GLUCOSE 101 (H) 12/24/2018 0939   GLUCOSE 106 (H) 09/14/2013 1127   BUN 16 12/24/2018 0939   BUN 8 09/14/2013 1127   CREATININE 0.64 12/24/2018 0939   CREATININE 0.71 09/14/2013 1127   CREATININE 0.68 04/01/2013 1550   CALCIUM 8.9 12/24/2018 0939   CALCIUM 8.6 09/14/2013 1127   PROT 7.0 12/24/2018 0939    PROT 7.6 09/14/2013 1127   ALBUMIN 4.0 12/24/2018 0939   ALBUMIN 3.2 (L) 09/14/2013 1127   AST 22 12/24/2018 0939   AST 22 09/14/2013 1127   ALT 13 12/24/2018 0939   ALT 19 09/14/2013 1127   ALKPHOS 79 12/24/2018 0939   ALKPHOS 76 09/14/2013 1127   BILITOT 0.8 12/24/2018 0939   BILITOT 0.4 09/14/2013 1127   GFRNONAA >60 12/24/2018 0939   GFRNONAA >60 09/14/2013 1127   GFRAA >60 12/24/2018 0939   GFRAA >60 09/14/2013 1127    No results found for: SPEP, UPEP  Lab Results  Component Value Date   WBC 7.7 12/24/2018   NEUTROABS 5.0 12/24/2018   HGB 13.8 12/24/2018   HCT 43.6 12/24/2018   MCV 90.8 12/24/2018   PLT 197 12/24/2018      Chemistry      Component Value Date/Time   NA 139 12/24/2018 0939   NA 142 09/14/2013 1127   K 3.9 12/24/2018 0939   K 3.5 09/14/2013 1127   CL 105 12/24/2018 0939   CL 110 (H) 09/14/2013 1127   CO2 24 12/24/2018 0939   CO2 29 09/14/2013 1127   BUN 16 12/24/2018 0939   BUN 8 09/14/2013 1127   CREATININE 0.64 12/24/2018 0939   CREATININE 0.71 09/14/2013 1127   CREATININE 0.68 04/01/2013 1550      Component Value Date/Time   CALCIUM 8.9 12/24/2018 0939   CALCIUM 8.6 09/14/2013 1127   ALKPHOS 79 12/24/2018 0939   ALKPHOS 76 09/14/2013 1127   AST 22 12/24/2018 0939   AST 22 09/14/2013 1127   ALT 13 12/24/2018 0939   ALT 19 09/14/2013 1127   BILITOT 0.8 12/24/2018 0939   BILITOT 0.4 09/14/2013 1127       RADIOGRAPHIC STUDIES: I have personally reviewed the radiological images as listed and agreed with the findings in the report. No results found.   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  Stable.  #Continue surveillance per labs normal.  We will  plan to get a CT scan in next 2 months.  # Low normal EF- 51% in march 2017 [prior to  starting chemotherapy]; July echo 50-55%-defer to PCP for further work-up/follow-up  # Chronic arthritis-NSAIDs as needed.stable .   #  Bilateral lung base scarring- ? Chronic/stable.  Await repeat imaging in couple of months.  #Port flushes every 2 months.  # DISPOSITION: #  follow up in 2 months-MD /labs-cbc/cmp/ldh/port flush; CTc/a/p prior- Dr.B   Orders Placed This Encounter  Procedures  . CT CHEST W CONTRAST    Standing Status:   Future    Standing Expiration Date:   12/24/2019    Order Specific Question:   If indicated for the ordered procedure, I authorize the administration of contrast media per Radiology protocol    Answer:   Yes    Order Specific Question:   Preferred imaging location?    Answer:   [facility]    Order Specific Question:   Radiology Contrast Protocol - do NOT remove file path    Answer:   [email or web address]    Order Specific Question:   ** REASON FOR EXAM (FREE TEXT)    Answer:   mantle cell lymphoma  . CT Abdomen Pelvis W Contrast    Standing Status:   Future    Standing Expiration Date:   12/24/2019    Order Specific Question:   ** REASON FOR EXAM (FREE TEXT)    Answer:   mantle cell lymphoma    Order Specific Question:   If indicated for the ordered procedure, I authorize the administration of contrast media per Radiology protocol    Answer:   Yes    Order Specific Question:   Preferred imaging location?    Answer:   [facility]    Order Specific Question:   Is Oral Contrast requested for this exam?    Answer:   Yes, Per Radiology protocol    Order Specific Question:   Radiology Contrast Protocol - do NOT remove file path    Answer:   \\[email or web address]   All questions were answered. The patient knows to call the clinic with any problems, questions or concerns.      Cammie Sickle, MD 12/24/2018 10:20 AM

## 2018-12-24 NOTE — Assessment & Plan Note (Addendum)
#  Mantle cell lymphoma stage IV-currently on surveillance [last Rituxan August 2019]; JAN 2020 CT- NED; except mild bil lung base scarring/see below.  Stable.  #Continue surveillance per labs normal.  We will plan to get a CT scan in next 2 months.  # Low normal EF- 51% in march 2017 [prior to starting chemotherapy]; July echo 50-55%-defer to PCP for further work-up/follow-up  # Chronic arthritis-NSAIDs as needed.stable .   #  Bilateral lung base scarring- ? Chronic/stable.  Await repeat imaging in couple of months.  #Port flushes every 2 months.  # DISPOSITION: #  follow up in 2 months-MD /labs-cbc/cmp/ldh/port flush; CTc/a/p prior- Dr.B

## 2018-12-27 ENCOUNTER — Ambulatory Visit: Payer: Self-pay | Admitting: Family

## 2018-12-30 ENCOUNTER — Telehealth: Payer: Self-pay

## 2018-12-30 NOTE — Telephone Encounter (Signed)
Copied from [place] [id number]. Topic: General - Other >> Dec 30, 2018 12:05 PM Antonieta Iba C wrote: Reason for CRM: Butch Penny - Cardiology with [facility] is calling in to request pt's medical records. They have received the referral from the office to see pt.   Fax: [phone] Phone: [phone]

## 2019-01-03 NOTE — Telephone Encounter (Signed)
I have last OV notes & echo to [facility].

## 2019-01-09 ENCOUNTER — Other Ambulatory Visit: Payer: Self-pay | Admitting: Family

## 2019-01-09 DIAGNOSIS — M2550 Pain in unspecified joint: Secondary | ICD-10-CM

## 2019-01-13 DIAGNOSIS — I447 Left bundle-branch block, unspecified: Secondary | ICD-10-CM | POA: Diagnosis not present

## 2019-01-13 DIAGNOSIS — E079 Disorder of thyroid, unspecified: Secondary | ICD-10-CM | POA: Diagnosis not present

## 2019-01-13 DIAGNOSIS — K219 Gastro-esophageal reflux disease without esophagitis: Secondary | ICD-10-CM | POA: Diagnosis not present

## 2019-01-13 DIAGNOSIS — E041 Nontoxic single thyroid nodule: Secondary | ICD-10-CM | POA: Diagnosis not present

## 2019-02-09 ENCOUNTER — Telehealth: Payer: Self-pay | Admitting: *Deleted

## 2019-02-09 ENCOUNTER — Other Ambulatory Visit: Payer: Self-pay | Admitting: *Deleted

## 2019-02-09 DIAGNOSIS — Z20822 Contact with and (suspected) exposure to covid-19: Secondary | ICD-10-CM

## 2019-02-09 DIAGNOSIS — Z20828 Contact with and (suspected) exposure to other viral communicable diseases: Secondary | ICD-10-CM

## 2019-02-09 DIAGNOSIS — C8311 Mantle cell lymphoma, lymph nodes of head, face, and neck: Secondary | ICD-10-CM

## 2019-02-09 NOTE — Telephone Encounter (Signed)
Patient called to report that her sister has COVID and though she has not had much contact with her sister, she has with others who have been around her sister.She is asking what she is to do. Please advise

## 2019-02-09 NOTE — Telephone Encounter (Signed)
Per Dr. Jacinto Reap, she will need covid testing as she is at high risk for covid. Apt sch. For 02/10/19 at 930 am at [facility]. Josh Borders, to call patient with apt.

## 2019-02-09 NOTE — Telephone Encounter (Signed)
I called patient and informed her of the appointment at 9:30 AM tomorrow at [facility] for COVID-19 testing.  She also understands that she should quarantine until the results return.

## 2019-02-10 ENCOUNTER — Other Ambulatory Visit: Payer: Self-pay

## 2019-02-10 DIAGNOSIS — Z20822 Contact with and (suspected) exposure to covid-19: Secondary | ICD-10-CM

## 2019-02-10 DIAGNOSIS — R6889 Other general symptoms and signs: Secondary | ICD-10-CM | POA: Diagnosis not present

## 2019-02-11 ENCOUNTER — Telehealth: Payer: Self-pay | Admitting: *Deleted

## 2019-02-11 LAB — NOVEL CORONAVIRUS, NAA: SARS-CoV-2, NAA: NOT DETECTED

## 2019-02-11 NOTE — Telephone Encounter (Signed)
Left vm for patient to return my phone call. 

## 2019-02-11 NOTE — Telephone Encounter (Signed)
-----   Message from Cammie Sickle, MD sent at 02/11/2019  3:07 PM EDT ----- Nira Conn- please inform pt that the COVID testing is negative. Dr.B

## 2019-02-17 ENCOUNTER — Ambulatory Visit
Admission: RE | Admit: 2019-02-17 | Discharge: 2019-02-17 | Disposition: A | Discharge status: Home / Self Care | Payer: Medicare HMO | Source: Ambulatory Visit | Attending: Internal Medicine | Admitting: Internal Medicine

## 2019-02-17 ENCOUNTER — Other Ambulatory Visit: Payer: Self-pay

## 2019-02-17 DIAGNOSIS — Z9221 Personal history of antineoplastic chemotherapy: Secondary | ICD-10-CM | POA: Diagnosis not present

## 2019-02-17 DIAGNOSIS — J849 Interstitial pulmonary disease, unspecified: Secondary | ICD-10-CM | POA: Insufficient documentation

## 2019-02-17 DIAGNOSIS — I700 Atherosclerosis of aorta: Secondary | ICD-10-CM | POA: Insufficient documentation

## 2019-02-17 DIAGNOSIS — K219 Gastro-esophageal reflux disease without esophagitis: Secondary | ICD-10-CM | POA: Insufficient documentation

## 2019-02-17 DIAGNOSIS — C8310 Mantle cell lymphoma, unspecified site: Secondary | ICD-10-CM | POA: Diagnosis not present

## 2019-02-17 DIAGNOSIS — E785 Hyperlipidemia, unspecified: Secondary | ICD-10-CM | POA: Diagnosis not present

## 2019-02-17 DIAGNOSIS — C8311 Mantle cell lymphoma, lymph nodes of head, face, and neck: Secondary | ICD-10-CM

## 2019-02-17 LAB — POCT I-STAT CREATININE: Creatinine, Ser: 0.8 mg/dL (ref 0.44–1.00)

## 2019-02-17 MED ORDER — IOHEXOL 300 MG/ML  SOLN
100.0000 mL | Freq: Once | INTRAMUSCULAR | Status: AC | PRN
  Administered 2019-02-17: 100 mL via INTRAVENOUS

## 2019-02-24 ENCOUNTER — Encounter: Payer: Self-pay | Admitting: Internal Medicine

## 2019-02-25 ENCOUNTER — Inpatient Hospital Stay (HOSPITAL_BASED_OUTPATIENT_CLINIC_OR_DEPARTMENT_OTHER): Payer: Medicare HMO | Admitting: Internal Medicine

## 2019-02-25 ENCOUNTER — Other Ambulatory Visit: Payer: Self-pay

## 2019-02-25 ENCOUNTER — Telehealth: Payer: Self-pay

## 2019-02-25 ENCOUNTER — Inpatient Hospital Stay: Payer: Medicare HMO | Attending: Internal Medicine

## 2019-02-25 DIAGNOSIS — I272 Pulmonary hypertension, unspecified: Secondary | ICD-10-CM | POA: Diagnosis not present

## 2019-02-25 DIAGNOSIS — C8311 Mantle cell lymphoma, lymph nodes of head, face, and neck: Secondary | ICD-10-CM | POA: Insufficient documentation

## 2019-02-25 LAB — CBC WITH DIFFERENTIAL/PLATELET
Abs Immature Granulocytes: 0.04 10*3/uL (ref 0.00–0.07)
Basophils Absolute: 0 10*3/uL (ref 0.0–0.1)
Basophils Relative: 0 %
Eosinophils Absolute: 0.1 10*3/uL (ref 0.0–0.5)
Eosinophils Relative: 2 %
HCT: 40.9 % (ref 36.0–46.0)
Hemoglobin: 13.1 g/dL (ref 12.0–15.0)
Immature Granulocytes: 1 %
Lymphocytes Relative: 21 %
Lymphs Abs: 1.3 10*3/uL (ref 0.7–4.0)
MCH: 29.1 pg (ref 26.0–34.0)
MCHC: 32 g/dL (ref 30.0–36.0)
MCV: 90.9 fL (ref 80.0–100.0)
Monocytes Absolute: 0.3 10*3/uL (ref 0.1–1.0)
Monocytes Relative: 5 %
Neutro Abs: 4.3 10*3/uL (ref 1.7–7.7)
Neutrophils Relative %: 71 %
Platelets: 199 10*3/uL (ref 150–400)
RBC: 4.5 MIL/uL (ref 3.87–5.11)
RDW: 14.5 % (ref 11.5–15.5)
WBC: 6 10*3/uL (ref 4.0–10.5)
nRBC: 0 % (ref 0.0–0.2)

## 2019-02-25 LAB — COMPREHENSIVE METABOLIC PANEL
ALT: 13 U/L (ref 0–44)
AST: 20 U/L (ref 15–41)
Albumin: 3.9 g/dL (ref 3.5–5.0)
Alkaline Phosphatase: 66 U/L (ref 38–126)
Anion gap: 6 (ref 5–15)
BUN: 17 mg/dL (ref 8–23)
CO2: 28 mmol/L (ref 22–32)
Calcium: 9 mg/dL (ref 8.9–10.3)
Chloride: 106 mmol/L (ref 98–111)
Creatinine, Ser: 0.73 mg/dL (ref 0.44–1.00)
GFR calc Af Amer: >60 mL/min (ref >60)
GFR calc non Af Amer: >60 mL/min (ref >60)
Glucose, Bld: 99 mg/dL (ref 70–99)
Potassium: 4.2 mmol/L (ref 3.5–5.1)
Sodium: 140 mmol/L (ref 135–145)
Total Bilirubin: 0.7 mg/dL (ref 0.3–1.2)
Total Protein: 6.9 g/dL (ref 6.5–8.1)

## 2019-02-25 LAB — LACTATE DEHYDROGENASE: LDH: 169 U/L (ref 98–192)

## 2019-02-25 NOTE — Assessment & Plan Note (Addendum)
#  Mantle cell lymphoma stage IV-currently on surveillance [last Rituxan August 2019]; September 2020 CT scan no evidence of recurrence of lymphoma/no splenomegaly.  Except continued mild bil lung base scarring/see below/dilated pulmonary artery.   #Given the stability of the mantle cell lymphoma; I would continue surveillance at this time ; we will plan to get again a CT scan in 6 months.  # Chronic arthritis-NSAIDs as needed.stable  #  Bilateral lung base scarring- ? Chronic/stable; Dilated PA; ? pulmonary hypertension.clinically patient is fairly asymptomatic.  Will refer to Pulmonary.   #Port flushes every 2 months.  # DISPOSITION: # refer to [facility] re: lung scarring/pul HTN #  follow up in 2 months-MD /labs-cbc/cmp/ldh/port flush;Dr.B  # 25 minutes face-to-face with the patient discussing the above plan of care; more than 50% of time spent on prognosis/ natural history; counseling and coordination.

## 2019-02-25 NOTE — Progress Notes (Signed)
[facility] OFFICE PROGRESS NOTE  Patient Care Team: Burnard Hawthorne, FNP as PCP - General (Family Medicine) Jackolyn Confer, MD (Internal Medicine)  Cancer Staging No matching staging information was found for the patient.   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 Feb 9th- START Benda-Ritux with neulasta; Prolonged neutropenia; DISCONT- Benda-Ritux;   # April 13 th 2017- START R-CHOP x1; severe/prolonged neutropenia; PET- CR; BMBx-Neg; Disc R-CHOP  # 26th MAY 2017- Start Rituxan q 54M Main OCT 12th 2017- PET NED.  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  female pleasant patient above history of mantle cell lymphoma currently on surveillance is here for follow-up/review results of the CAT scan.  Patient denies any worsening shortness of breath or cough.  Denies any nausea vomiting.  Appetite is good.  No chest pain.   She denies any new lumps or bumps.  Denies any night sweats.  Review of Systems  Constitutional: Negative for chills, diaphoresis, fever, malaise/fatigue and weight loss.  HENT: Negative for nosebleeds and sore throat.   Eyes: Negative for double vision.  Respiratory: Negative for hemoptysis, sputum production and wheezing.   Cardiovascular: Negative for chest pain, palpitations, orthopnea and leg swelling.  Gastrointestinal: Negative for abdominal pain, blood in stool, constipation, diarrhea,  heartburn, melena, nausea and vomiting.  Genitourinary: Negative for dysuria, frequency and urgency.  Musculoskeletal: Negative for back pain and joint pain.  Skin: Negative.  Negative for itching and rash.  Neurological: Negative for dizziness, tingling, focal weakness, weakness and headaches.  Endo/Heme/Allergies: Does not bruise/bleed easily.  Psychiatric/Behavioral: Negative for depression. The patient is not nervous/anxious and does not have insomnia.     PAST MEDICAL HISTORY :  Past Medical History:  Diagnosis Date  . Arthritis   . Collagen vascular disease ([place])   . GERD (gastroesophageal reflux disease)   . Headache(784.0)   . History of methotrexate therapy   . Hyperlipidemia    hx  . Lymphadenopathy of head and neck 01/2015   see on Thyroid ultrasound  . Lymphoma, mantle cell ([place]) 06/01/2015   bx of lymph node in right breast/Stage IV Mantle Cell Lymphoma  . Personal history of chemotherapy   . Rheumatoid arthritis ([place])     PAST SURGICAL HISTORY :   Past Surgical History:  Procedure Laterality Date  . CARDIAC CATHETERIZATION  09/2004   [facility]; EF 60%  . CARDIAC CATHETERIZATION  08/2004   [facility]  . IR FLUORO GUIDED NEEDLE PLC ASPIRATION/INJECTION LOC  06/18/2018  . PERIPHERAL VASCULAR CATHETERIZATION N/A 07/04/2015   Procedure: [name] Insertion;  Surgeon: [name], MD;  Location: [facility];  Service: Cardiovascular;  Laterality: N/A;  . PORTA CATH REMOVAL N/A 06/23/2018   Procedure: PORTA CATH REMOVAL;  Surgeon: [name], MD;  Location: [facility];  Service: Cardiovascular;  Laterality: N/A;    FAMILY HISTORY :   Family History  Problem Relation Age of  Onset  . Diabetes Mother   . Cholelithiasis Mother   . Hypertension Sister   . Diabetes Sister   . Arthritis Brother   . Breast cancer Neg Hx     SOCIAL HISTORY:   Social History   Tobacco Use  . Smoking status: Never Smoker  . Smokeless tobacco: Never Used  Substance Use Topics   . Alcohol use: No  . Drug use: No    ALLERGIES:  has No Known Allergies.  MEDICATIONS:  Current Outpatient Medications  Medication Sig Dispense Refill  . acetaminophen (TYLENOL) 500 MG tablet Take 500 mg by mouth every 6 (six) hours as needed.    . Calcium Carbonate-Vit D-Min (CALTRATE 600+D PLUS) 600-800 MG-UNIT CHEW Chew 1 tablet by mouth 2 (two) times daily. 60 tablet 6  . cyanocobalamin 500 MCG tablet Take 500 mcg by mouth daily. Take two gummies daily    . diclofenac sodium (VOLTAREN) 1 % GEL Apply 4 g topically 4 (four) times daily. 100 g 1  . fluticasone (FLONASE) 50 MCG/ACT nasal spray SPRAY TWICE IN EACH NOSTRIL ONCE DAILY  4  . lidocaine (LIDODERM) 5 % Place 1 patch onto the skin daily. Remove & Discard patch within 12 hours. 30 patch 1  . meclizine (ANTIVERT) 25 MG tablet Take 25 mg by mouth 3 (three) times daily as needed for dizziness.    [name] MEGARED OMEGA-3 KRILL OIL 500 MG CAPS Take 500 mg by mouth daily.    . meloxicam (MOBIC) 7.5 MG tablet TAKE 1 TABLET BY MOUTH DAILY AS NEEDED FOR PAIN 30 tablet 1  . Multiple Vitamins-Minerals (MULTIVITAMIN GUMMIES ADULT) CHEW Chew by mouth. Reported on 09/06/2015    . Omega-3 1000 MG CAPS Take 1 capsule by mouth daily.     [name] omeprazole (PRILOSEC) 40 MG capsule Take 1 capsule by mouth daily.    . pravastatin (PRAVACHOL) 40 MG tablet TAKE 1 TABLET BY MOUTH EVERY DAY 90 tablet 1  . VENTOLIN HFA 108 (90 Base) MCG/ACT inhaler TAKE 2 PUFFS BY MOUTH EVERY 6 HOURS AS NEEDED FOR WHEEZE OR SHORTNESS OF BREATH 18 Inhaler 5   No current facility-administered medications for this visit.    Facility-Administered Medications Ordered in Other Visits  Medication Dose Route Frequency Provider Last Rate Last Dose  . heparin lock flush 100 unit/mL  500 Units Intravenous Once Charlaine Dalton R, MD      . sodium chloride flush (NS) 0.9 % injection 10 mL  10 mL Intravenous Once Charlaine Dalton R, MD      . sodium chloride flush (NS) 0.9 % injection 10  mL  10 mL Intravenous Once Cammie Sickle, MD      . Tbo-Filgrastim [facility]) injection 480 mcg  480 mcg Subcutaneous Once Cammie Sickle, MD        PHYSICAL EXAMINATION: ECOG PERFORMANCE STATUS: 0 - Asymptomatic  BP 131/73 (BP Location: Left Arm, Patient Position: Sitting, Cuff Size: Normal)   Pulse 73   Temp (!) 97.4 F (36.3 C) (Tympanic)   Resp 16   Wt 255 lb (115.7 kg)   BMI 39.93 kg/m   Filed Weights   02/24/19 1624  Weight: 255 lb (115.7 kg)    Physical Exam  Constitutional: She is oriented to person, place, and time and well-developed, well-nourished, and in no distress.  Obese.  Walking herself with cane.   HENT:  Head: Normocephalic and atraumatic.  Mouth/Throat: Oropharynx is clear and moist. No oropharyngeal exudate.  Eyes: Pupils are equal, round,  and reactive to light.  Neck: Normal range of motion. Neck supple.  Cardiovascular: Normal rate and regular rhythm.  Pulmonary/Chest: No respiratory distress. She has no wheezes.  Abdominal: Soft. Bowel sounds are normal. She exhibits no distension and no mass. There is no abdominal tenderness. There is no rebound and no guarding.  Musculoskeletal: Normal range of motion.        General: No tenderness or edema.  Neurological: She is alert and oriented to person, place, and time.  Skin: Skin is warm.  Psychiatric: Affect normal.   LABORATORY DATA:  I have reviewed the data as listed    Component Value Date/Time   NA 140 02/25/2019 1315   NA 142 09/14/2013 1127   K 4.2 02/25/2019 1315   K 3.5 09/14/2013 1127   CL 106 02/25/2019 1315   CL 110 (H) 09/14/2013 1127   CO2 28 02/25/2019 1315   CO2 29 09/14/2013 1127   GLUCOSE 99 02/25/2019 1315   GLUCOSE 106 (H) 09/14/2013 1127   BUN 17 02/25/2019 1315   BUN 8 09/14/2013 1127   CREATININE 0.73 02/25/2019 1315   CREATININE 0.71 09/14/2013 1127   CREATININE 0.68 04/01/2013 1550   CALCIUM 9.0 02/25/2019 1315   CALCIUM 8.6 09/14/2013 1127   PROT 6.9  02/25/2019 1315   PROT 7.6 09/14/2013 1127   ALBUMIN 3.9 02/25/2019 1315   ALBUMIN 3.2 (L) 09/14/2013 1127   AST 20 02/25/2019 1315   AST 22 09/14/2013 1127   ALT 13 02/25/2019 1315   ALT 19 09/14/2013 1127   ALKPHOS 66 02/25/2019 1315   ALKPHOS 76 09/14/2013 1127   BILITOT 0.7 02/25/2019 1315   BILITOT 0.4 09/14/2013 1127   GFRNONAA >60 02/25/2019 1315   GFRNONAA >60 09/14/2013 1127   GFRAA >60 02/25/2019 1315   GFRAA >60 09/14/2013 1127    No results found for: SPEP, UPEP  Lab Results  Component Value Date   WBC 6.0 02/25/2019   NEUTROABS 4.3 02/25/2019   HGB 13.1 02/25/2019   HCT 40.9 02/25/2019   MCV 90.9 02/25/2019   PLT 199 02/25/2019      Chemistry      Component Value Date/Time   NA 140 02/25/2019 1315   NA 142 09/14/2013 1127   K 4.2 02/25/2019 1315   K 3.5 09/14/2013 1127   CL 106 02/25/2019 1315   CL 110 (H) 09/14/2013 1127   CO2 28 02/25/2019 1315   CO2 29 09/14/2013 1127   BUN 17 02/25/2019 1315   BUN 8 09/14/2013 1127   CREATININE 0.73 02/25/2019 1315   CREATININE 0.71 09/14/2013 1127   CREATININE 0.68 04/01/2013 1550      Component Value Date/Time   CALCIUM 9.0 02/25/2019 1315   CALCIUM 8.6 09/14/2013 1127   ALKPHOS 66 02/25/2019 1315   ALKPHOS 76 09/14/2013 1127   AST 20 02/25/2019 1315   AST 22 09/14/2013 1127   ALT 13 02/25/2019 1315   ALT 19 09/14/2013 1127   BILITOT 0.7 02/25/2019 1315   BILITOT 0.4 09/14/2013 1127       RADIOGRAPHIC STUDIES: I have personally reviewed the radiological images as listed and agreed with the findings in the report. No results found.   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  Except continued mild bil lung base scarring/see below/dilated pulmonary artery.   #  Given the stability of the mantle  cell lymphoma; I would continue surveillance at this time ; we will plan to get again a CT scan in 6 months.  # Chronic arthritis-NSAIDs as needed.stable  #  Bilateral lung base scarring- ? Chronic/stable; Dilated PA; ? pulmonary hypertension.clinically patient is fairly asymptomatic.  Will refer to Pulmonary.   #Port flushes every 2 months.  # DISPOSITION: # refer to [facility] pulmonary re: lung scarring/pul HTN #  follow up in 2 months-MD /labs-cbc/cmp/ldh/port flush;Dr.B  # 25 minutes face-to-face with the patient discussing the above plan of care; more than 50% of time spent on prognosis/ natural history; counseling and coordination.    No orders of the defined types were placed in this encounter.  All questions were answered. The patient knows to call the clinic with any problems, questions or concerns.      Cammie Sickle, MD 02/25/2019 2:53 PM

## 2019-02-25 NOTE — Telephone Encounter (Signed)
Copied from [place] [id number]. Topic: General - Other >> Feb 25, 2019  2:12 PM Lennox Solders wrote: Reason for CRM:pt is calling and does not know if she had shingles  vaccine and would like to get it

## 2019-02-25 NOTE — Telephone Encounter (Signed)
I spoke with patient & she stated that she just got flu shot today. I have documented in chart. I also let her know I did not see where she had ever had the shingles shot with [place], so since she was over 65 she could get from her pharmacy. Patient stated that she had spoke with pharmacy & she would get shingrix sometime soon.

## 2019-03-11 ENCOUNTER — Other Ambulatory Visit: Payer: Self-pay | Admitting: Family

## 2019-03-11 DIAGNOSIS — M2550 Pain in unspecified joint: Secondary | ICD-10-CM

## 2019-03-28 ENCOUNTER — Other Ambulatory Visit: Payer: Self-pay | Admitting: Family

## 2019-03-28 DIAGNOSIS — E785 Hyperlipidemia, unspecified: Secondary | ICD-10-CM

## 2019-04-21 ENCOUNTER — Other Ambulatory Visit: Payer: Self-pay

## 2019-04-21 ENCOUNTER — Other Ambulatory Visit: Payer: Self-pay | Admitting: *Deleted

## 2019-04-21 DIAGNOSIS — Z961 Presence of intraocular lens: Secondary | ICD-10-CM | POA: Diagnosis not present

## 2019-04-21 DIAGNOSIS — H18591 Other hereditary corneal dystrophies, right eye: Secondary | ICD-10-CM | POA: Diagnosis not present

## 2019-04-21 DIAGNOSIS — Z79899 Other long term (current) drug therapy: Secondary | ICD-10-CM | POA: Diagnosis not present

## 2019-04-21 DIAGNOSIS — C8311 Mantle cell lymphoma, lymph nodes of head, face, and neck: Secondary | ICD-10-CM

## 2019-04-22 ENCOUNTER — Other Ambulatory Visit: Payer: Self-pay

## 2019-04-22 ENCOUNTER — Inpatient Hospital Stay: Payer: Medicare HMO | Attending: Internal Medicine

## 2019-04-22 ENCOUNTER — Inpatient Hospital Stay (HOSPITAL_BASED_OUTPATIENT_CLINIC_OR_DEPARTMENT_OTHER): Payer: Medicare HMO | Admitting: Internal Medicine

## 2019-04-22 ENCOUNTER — Encounter: Payer: Self-pay | Admitting: Internal Medicine

## 2019-04-22 ENCOUNTER — Other Ambulatory Visit: Payer: Self-pay | Admitting: Licensed Clinical Social Worker

## 2019-04-22 DIAGNOSIS — C8311 Mantle cell lymphoma, lymph nodes of head, face, and neck: Secondary | ICD-10-CM

## 2019-04-22 DIAGNOSIS — M069 Rheumatoid arthritis, unspecified: Secondary | ICD-10-CM | POA: Insufficient documentation

## 2019-04-22 LAB — CBC WITH DIFFERENTIAL/PLATELET
Abs Immature Granulocytes: 0.03 10*3/uL (ref 0.00–0.07)
Basophils Absolute: 0 10*3/uL (ref 0.0–0.1)
Basophils Relative: 0 %
Eosinophils Absolute: 0.1 10*3/uL (ref 0.0–0.5)
Eosinophils Relative: 3 %
HCT: 41 % (ref 36.0–46.0)
Hemoglobin: 12.9 g/dL (ref 12.0–15.0)
Immature Granulocytes: 1 %
Lymphocytes Relative: 21 %
Lymphs Abs: 1.2 10*3/uL (ref 0.7–4.0)
MCH: 28.7 pg (ref 26.0–34.0)
MCHC: 31.5 g/dL (ref 30.0–36.0)
MCV: 91.3 fL (ref 80.0–100.0)
Monocytes Absolute: 0.3 10*3/uL (ref 0.1–1.0)
Monocytes Relative: 6 %
Neutro Abs: 3.9 10*3/uL (ref 1.7–7.7)
Neutrophils Relative %: 69 %
Platelets: 192 10*3/uL (ref 150–400)
RBC: 4.49 MIL/uL (ref 3.87–5.11)
RDW: 14.8 % (ref 11.5–15.5)
WBC: 5.5 10*3/uL (ref 4.0–10.5)
nRBC: 0 % (ref 0.0–0.2)

## 2019-04-22 LAB — COMPREHENSIVE METABOLIC PANEL
ALT: 16 U/L (ref 0–44)
AST: 21 U/L (ref 15–41)
Albumin: 3.8 g/dL (ref 3.5–5.0)
Alkaline Phosphatase: 68 U/L (ref 38–126)
Anion gap: 4 — ABNORMAL LOW (ref 5–15)
BUN: 19 mg/dL (ref 8–23)
CO2: 28 mmol/L (ref 22–32)
Calcium: 8.8 mg/dL — ABNORMAL LOW (ref 8.9–10.3)
Chloride: 107 mmol/L (ref 98–111)
Creatinine, Ser: 0.71 mg/dL (ref 0.44–1.00)
GFR calc Af Amer: >60 mL/min (ref >60)
GFR calc non Af Amer: >60 mL/min (ref >60)
Glucose, Bld: 114 mg/dL — ABNORMAL HIGH (ref 70–99)
Potassium: 4.1 mmol/L (ref 3.5–5.1)
Sodium: 139 mmol/L (ref 135–145)
Total Bilirubin: 0.6 mg/dL (ref 0.3–1.2)
Total Protein: 6.5 g/dL (ref 6.5–8.1)

## 2019-04-22 LAB — LACTATE DEHYDROGENASE: LDH: 169 U/L (ref 98–192)

## 2019-04-22 NOTE — Assessment & Plan Note (Addendum)
#  Mantle cell lymphoma stage IV-currently on surveillance [last Rituxan August 2019]; September 2020 CT scan no evidence of recurrence of lymphoma/no splenomegaly.  Stable;  except continued mild bil lung base scarring/see below/dilated pulmonary artery.   #  I would continue surveillance at this time ;we will order scan at next visit. [every 6 months]   # Chronic arthritis-NSAIDs as needed. STABLE.   #  Bilateral lung base scarring- ? Chronic/-STABLE Dilated PA; ? pulmonary hypertension.clinically patient is fairly asymptomatic.  Wants to wait on Pulmonary referral.   #Port flush- explanted.  # DISPOSITION: #  follow up in 3 months-MD /labs-cbc/cmp/ldhDr.B

## 2019-04-22 NOTE — Progress Notes (Signed)
[facility] OFFICE PROGRESS NOTE  Patient Care Team: Burnard Hawthorne, FNP as PCP - General (Family Medicine) Jackolyn Confer, MD (Internal Medicine)  Cancer Staging No matching staging information was found for the patient.   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 Feb 9th- START Benda-Ritux with neulasta; Prolonged neutropenia; DISCONT- Benda-Ritux;   # April 13 th 2017- START R-CHOP x1; severe/prolonged neutropenia; PET- CR; BMBx-Neg; Disc R-CHOP  # 26th MAY 2017- Start Rituxan q 4M Main OCT 12th 2017- PET NED.  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  female pleasant patient above history of mantle cell lymphoma currently on surveillance is here for follow-up.  Patient did not make appointment for pulmonary- "lung scarring/pulmonary hypertension".  Denies of worsening shortness of breath or cough.  Denies any nausea vomiting.  No new lumps or bumps.  No night sweats.  No chest pain.  Review of Systems  Constitutional: Negative for chills, diaphoresis, fever, malaise/fatigue and weight loss.  HENT: Negative for nosebleeds and sore throat.   Eyes: Negative for double vision.  Respiratory: Negative for hemoptysis, sputum production and wheezing.   Cardiovascular: Negative for chest pain, palpitations, orthopnea and leg swelling.  Gastrointestinal: Negative for abdominal pain, blood in stool, constipation,  diarrhea, heartburn, melena, nausea and vomiting.  Genitourinary: Negative for dysuria, frequency and urgency.  Musculoskeletal: Negative for back pain and joint pain.  Skin: Negative.  Negative for itching and rash.  Neurological: Negative for dizziness, tingling, focal weakness, weakness and headaches.  Endo/Heme/Allergies: Does not bruise/bleed easily.  Psychiatric/Behavioral: Negative for depression. The patient is not nervous/anxious and does not have insomnia.     PAST MEDICAL HISTORY :  Past Medical History:  Diagnosis Date  . Arthritis   . Collagen vascular disease ([place])   . GERD (gastroesophageal reflux disease)   . Headache(784.0)   . History of methotrexate therapy   . Hyperlipidemia    hx  . Lymphadenopathy of head and neck 01/2015   see on Thyroid ultrasound  . Lymphoma, mantle cell ([place]) 06/01/2015   bx of lymph node in right breast/Stage IV Mantle Cell Lymphoma  . Personal history of chemotherapy   . Rheumatoid arthritis ([place])     PAST SURGICAL HISTORY :   Past Surgical History:  Procedure Laterality Date  . CARDIAC CATHETERIZATION  09/2004   [facility]; EF 60%  . CARDIAC CATHETERIZATION  08/2004   [facility]  . IR FLUORO GUIDED NEEDLE PLC ASPIRATION/INJECTION LOC  06/18/2018  . PERIPHERAL VASCULAR CATHETERIZATION N/A 07/04/2015   Procedure: [name] Insertion;  Surgeon: [name], MD;  Location: [facility];  Service: Cardiovascular;  Laterality: N/A;  . PORTA CATH REMOVAL N/A 06/23/2018   Procedure: PORTA CATH REMOVAL;  Surgeon: [name], MD;  Location: [facility];  Service: Cardiovascular;  Laterality: N/A;    FAMILY HISTORY :   Family History  Problem Relation Age of Onset  .  Diabetes Mother   . Cholelithiasis Mother   . Hypertension Sister   . Diabetes Sister   . Arthritis Brother   . Breast cancer Neg Hx     SOCIAL HISTORY:   Social History   Tobacco Use  . Smoking status: Never Smoker  . Smokeless tobacco: Never Used  Substance  Use Topics  . Alcohol use: No  . Drug use: No    ALLERGIES:  has No Known Allergies.  MEDICATIONS:  Current Outpatient Medications  Medication Sig Dispense Refill  . acetaminophen (TYLENOL) 500 MG tablet Take 500 mg by mouth every 6 (six) hours as needed.    . Calcium Carbonate-Vit D-Min (CALTRATE 600+D PLUS) 600-800 MG-UNIT CHEW Chew 1 tablet by mouth 2 (two) times daily. 60 tablet 6  . cyanocobalamin 500 MCG tablet Take 500 mcg by mouth daily. Take two gummies daily    . diclofenac sodium (VOLTAREN) 1 % GEL Apply 4 g topically 4 (four) times daily. 100 g 1  . meclizine (ANTIVERT) 25 MG tablet Take 25 mg by mouth 3 (three) times daily as needed for dizziness.    [name] MEGARED OMEGA-3 KRILL OIL 500 MG CAPS Take 500 mg by mouth daily.    . Multiple Vitamins-Minerals (MULTIVITAMIN GUMMIES ADULT) CHEW Chew by mouth. Reported on 09/06/2015    . Omega-3 1000 MG CAPS Take 1 capsule by mouth daily.     [name] omeprazole (PRILOSEC) 40 MG capsule Take 1 capsule by mouth daily.    . pravastatin (PRAVACHOL) 40 MG tablet TAKE 1 TABLET BY MOUTH EVERY DAY 90 tablet 1  . VENTOLIN HFA 108 (90 Base) MCG/ACT inhaler TAKE 2 PUFFS BY MOUTH EVERY 6 HOURS AS NEEDED FOR WHEEZE OR SHORTNESS OF BREATH 18 Inhaler 5  . fluticasone (FLONASE) 50 MCG/ACT nasal spray SPRAY TWICE IN EACH NOSTRIL ONCE DAILY  4  . meloxicam (MOBIC) 7.5 MG tablet TAKE 1 TABLET BY MOUTH DAILY AS NEEDED FOR PAIN (Patient not taking: Reported on 04/21/2019) 30 tablet 1   No current facility-administered medications for this visit.    Facility-Administered Medications Ordered in Other Visits  Medication Dose Route Frequency Provider Last Rate Last Dose  . heparin lock flush 100 unit/mL  500 Units Intravenous Once Charlaine Dalton R, MD      . sodium chloride flush (NS) 0.9 % injection 10 mL  10 mL Intravenous Once Charlaine Dalton R, MD      . sodium chloride flush (NS) 0.9 % injection 10 mL  10 mL Intravenous Once Cammie Sickle, MD       . Tbo-Filgrastim [facility]) injection 480 mcg  480 mcg Subcutaneous Once Cammie Sickle, MD        PHYSICAL EXAMINATION: ECOG PERFORMANCE STATUS: 0 - Asymptomatic  BP (!) 145/78 (BP Location: Left Arm, Patient Position: Sitting, Cuff Size: Large)   Pulse 69   Temp 97.6 F (36.4 C) (Tympanic)   Wt 258 lb (117 kg)   BMI 40.40 kg/m   Filed Weights   04/22/19 1358  Weight: 258 lb (117 kg)    Physical Exam  Constitutional: She is oriented to person, place, and time and well-developed, well-nourished, and in no distress.  Obese.  Walking herself with cane.   HENT:  Head: Normocephalic and atraumatic.  Mouth/Throat: Oropharynx is clear and moist. No oropharyngeal exudate.  Eyes: Pupils are equal, round, and reactive to light.  Neck: Normal range of motion. Neck supple.  Cardiovascular: Normal rate and regular rhythm.  Pulmonary/Chest: No respiratory distress.  She has no wheezes.  Abdominal: Soft. Bowel sounds are normal. She exhibits no distension and no mass. There is no abdominal tenderness. There is no rebound and no guarding.  Musculoskeletal: Normal range of motion.        General: No tenderness or edema.  Neurological: She is alert and oriented to person, place, and time.  Skin: Skin is warm.  Psychiatric: Affect normal.   LABORATORY DATA:  I have reviewed the data as listed    Component Value Date/Time   NA 139 04/22/2019 1334   NA 142 09/14/2013 1127   K 4.1 04/22/2019 1334   K 3.5 09/14/2013 1127   CL 107 04/22/2019 1334   CL 110 (H) 09/14/2013 1127   CO2 28 04/22/2019 1334   CO2 29 09/14/2013 1127   GLUCOSE 114 (H) 04/22/2019 1334   GLUCOSE 106 (H) 09/14/2013 1127   BUN 19 04/22/2019 1334   BUN 8 09/14/2013 1127   CREATININE 0.71 04/22/2019 1334   CREATININE 0.71 09/14/2013 1127   CREATININE 0.68 04/01/2013 1550   CALCIUM 8.8 (L) 04/22/2019 1334   CALCIUM 8.6 09/14/2013 1127   PROT 6.5 04/22/2019 1334   PROT 7.6 09/14/2013 1127   ALBUMIN 3.8  04/22/2019 1334   ALBUMIN 3.2 (L) 09/14/2013 1127   AST 21 04/22/2019 1334   AST 22 09/14/2013 1127   ALT 16 04/22/2019 1334   ALT 19 09/14/2013 1127   ALKPHOS 68 04/22/2019 1334   ALKPHOS 76 09/14/2013 1127   BILITOT 0.6 04/22/2019 1334   BILITOT 0.4 09/14/2013 1127   GFRNONAA >60 04/22/2019 1334   GFRNONAA >60 09/14/2013 1127   GFRAA >60 04/22/2019 1334   GFRAA >60 09/14/2013 1127    No results found for: SPEP, UPEP  Lab Results  Component Value Date   WBC 5.5 04/22/2019   NEUTROABS 3.9 04/22/2019   HGB 12.9 04/22/2019   HCT 41.0 04/22/2019   MCV 91.3 04/22/2019   PLT 192 04/22/2019      Chemistry      Component Value Date/Time   NA 139 04/22/2019 1334   NA 142 09/14/2013 1127   K 4.1 04/22/2019 1334   K 3.5 09/14/2013 1127   CL 107 04/22/2019 1334   CL 110 (H) 09/14/2013 1127   CO2 28 04/22/2019 1334   CO2 29 09/14/2013 1127   BUN 19 04/22/2019 1334   BUN 8 09/14/2013 1127   CREATININE 0.71 04/22/2019 1334   CREATININE 0.71 09/14/2013 1127   CREATININE 0.68 04/01/2013 1550      Component Value Date/Time   CALCIUM 8.8 (L) 04/22/2019 1334   CALCIUM 8.6 09/14/2013 1127   ALKPHOS 68 04/22/2019 1334   ALKPHOS 76 09/14/2013 1127   AST 21 04/22/2019 1334   AST 22 09/14/2013 1127   ALT 16 04/22/2019 1334   ALT 19 09/14/2013 1127   BILITOT 0.6 04/22/2019 1334   BILITOT 0.4 09/14/2013 1127       RADIOGRAPHIC STUDIES: I have personally reviewed the radiological images as listed and agreed with the findings in the report. No results found.   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  Stable;  except continued mild bil lung base scarring/see below/dilated pulmonary artery.   #  I would continue surveillance at this time ;we will order scan at next visit.  Willey Blade  6 months]   # Chronic arthritis-NSAIDs as needed. STABLE.   #  Bilateral lung base scarring- ? Chronic/-STABLE Dilated PA; ? pulmonary hypertension.clinically patient is fairly asymptomatic.  Wants to wait on Pulmonary referral.   #Port flush- explanted.  # DISPOSITION: #  follow up in 3 months-MD /labs-cbc/cmp/ldhDr.B     No orders of the defined types were placed in this encounter.  All questions were answered. The patient knows to call the clinic with any problems, questions or concerns.      Cammie Sickle, MD 04/22/2019 2:11 PM

## 2019-05-19 ENCOUNTER — Other Ambulatory Visit: Payer: Self-pay | Admitting: Internal Medicine

## 2019-05-19 DIAGNOSIS — M2550 Pain in unspecified joint: Secondary | ICD-10-CM

## 2019-06-14 ENCOUNTER — Telehealth: Payer: Self-pay | Admitting: Family

## 2019-06-14 NOTE — Telephone Encounter (Signed)
I have filled out & placed in blue folder. Do you want an appointment scheduled for this?

## 2019-06-14 NOTE — Telephone Encounter (Signed)
Pt would like a form filled out for handicap placard. Please advise.

## 2019-06-15 NOTE — Telephone Encounter (Signed)
Call pt We will complete paperwork However she is due for f/u, please schedule

## 2019-06-15 NOTE — Telephone Encounter (Signed)
I called patient to let her know that she could come pick up placard form. I screened patient & she was okay to come in office.  F/u scheduled for 07/15/19.

## 2019-06-16 ENCOUNTER — Other Ambulatory Visit: Payer: Self-pay

## 2019-06-16 DIAGNOSIS — E785 Hyperlipidemia, unspecified: Secondary | ICD-10-CM

## 2019-06-16 DIAGNOSIS — M2550 Pain in unspecified joint: Secondary | ICD-10-CM

## 2019-06-16 MED ORDER — MELOXICAM 7.5 MG PO TABS: ORAL_TABLET | ORAL | 1 refills | Status: DC

## 2019-06-16 MED ORDER — PRAVASTATIN SODIUM 40 MG PO TABS: 40.0000 mg | ORAL_TABLET | Freq: Every day | ORAL | 1 refills | Status: DC

## 2019-06-16 MED ORDER — OMEPRAZOLE 40 MG PO CPDR: 40.0000 mg | DELAYED_RELEASE_CAPSULE | Freq: Every day | ORAL | 1 refills | Status: DC

## 2019-06-17 ENCOUNTER — Other Ambulatory Visit: Payer: Self-pay | Admitting: Family

## 2019-06-21 ENCOUNTER — Ambulatory Visit: Payer: Medicare HMO | Attending: Internal Medicine

## 2019-06-21 DIAGNOSIS — Z20822 Contact with and (suspected) exposure to covid-19: Secondary | ICD-10-CM

## 2019-06-22 LAB — NOVEL CORONAVIRUS, NAA: SARS-CoV-2, NAA: NOT DETECTED

## 2019-06-29 ENCOUNTER — Other Ambulatory Visit: Payer: Self-pay

## 2019-07-15 ENCOUNTER — Encounter: Payer: Self-pay | Admitting: Family

## 2019-07-15 ENCOUNTER — Other Ambulatory Visit: Payer: Self-pay

## 2019-07-15 ENCOUNTER — Ambulatory Visit (INDEPENDENT_AMBULATORY_CARE_PROVIDER_SITE_OTHER): Payer: Medicare HMO | Admitting: Family

## 2019-07-15 VITALS — Ht 66.0 in | Wt 253.0 lb

## 2019-07-15 DIAGNOSIS — K219 Gastro-esophageal reflux disease without esophagitis: Secondary | ICD-10-CM

## 2019-07-15 DIAGNOSIS — Z1231 Encounter for screening mammogram for malignant neoplasm of breast: Secondary | ICD-10-CM | POA: Diagnosis not present

## 2019-07-15 DIAGNOSIS — R030 Elevated blood-pressure reading, without diagnosis of hypertension: Secondary | ICD-10-CM | POA: Diagnosis not present

## 2019-07-15 NOTE — Progress Notes (Signed)
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 Taking mobic most days for joint pain.  Has cut back on salt. Weight stable at 253 lbs.  Never saw cardiology last year.   Has BL leg swelling , which is chronic for her. Not particularly bothersome. 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 Recently starting wearing compression stockings with relief. Sleeping propped up on two pillows.   Denies exertional chest pain or pressure, numbness or tingling radiating to left arm or jaw, palpitations, dizziness, frequent headaches, changes in vision   Echo low normal systolic function.  No symptoms of GERD on prilosec. No trouble or pain with swallowing.   History, background, results pertinent:  Follows with Dr Burlene Arnt   A/P/next steps:  Problem List Items Addressed This Visit      Digestive   GERD (gastroesophageal reflux disease)    Asymptomatic on medication, will continue        Other   Elevated blood pressure reading - Primary    BP Readings from Last 3 Encounters:  04/22/19 (!) 145/78  02/24/19 131/73  12/24/18 122/66   Elevated at last visit with onc/heme. Advised to limit salt, mobic.  Discussed again her echocardiogram done last year.  Advised that with her history of chemotherapy, lower extremity swelling, my concern of orthopnea, cardiology consult is most warranted.  Patient agreeable and referral has been placed.  She will also give [place] a call after she sees Dr. Burlene Arnt in regards to her blood pressure reading.  It may be that she needs a low-dose medication as well.  Patient verbalized understanding of all      Relevant Orders   Ambulatory referral to Cardiology    Other Visit Diagnoses    Encounter for screening mammogram for malignant neoplasm of breast       Relevant Orders   MM 3D SCREEN BREAST BILATERAL    Patient will schedule mammogram.   I spent 20 min  discussing plan of care over the phone.

## 2019-07-15 NOTE — Assessment & Plan Note (Addendum)
BP Readings from Last 3 Encounters:  04/22/19 (!) 145/78  02/24/19 131/73  12/24/18 122/66   Elevated at last visit with onc/heme. Advised to limit salt, mobic.  Discussed again her echocardiogram done last year.  Advised that with her history of chemotherapy, lower extremity swelling, my concern of orthopnea, cardiology consult is most warranted.  Patient agreeable and referral has been placed.  She will also give [place] a call after she sees Dr. Burlene Arnt in regards to her blood pressure reading.  It may be that she needs a low-dose medication as well.  Patient verbalized understanding of all

## 2019-07-15 NOTE — Assessment & Plan Note (Signed)
Asymptomatic on medication, will continue

## 2019-07-18 ENCOUNTER — Telehealth: Payer: Self-pay | Admitting: Family

## 2019-07-18 NOTE — Telephone Encounter (Signed)
Patient called to let Arnett know the following; Shringex injection on 03/15/2019 and 2nd 05/20/2019. COVID 1st  vaccine 07/18/2019 Pfizer.

## 2019-07-18 NOTE — Telephone Encounter (Signed)
I have documented in patient's chart. 

## 2019-07-21 ENCOUNTER — Other Ambulatory Visit: Payer: Self-pay

## 2019-07-22 ENCOUNTER — Encounter: Payer: Self-pay | Admitting: Internal Medicine

## 2019-07-22 ENCOUNTER — Other Ambulatory Visit: Payer: Self-pay

## 2019-07-22 ENCOUNTER — Inpatient Hospital Stay (HOSPITAL_BASED_OUTPATIENT_CLINIC_OR_DEPARTMENT_OTHER): Payer: Medicare HMO | Admitting: Internal Medicine

## 2019-07-22 ENCOUNTER — Inpatient Hospital Stay: Payer: Medicare HMO | Attending: Internal Medicine

## 2019-07-22 DIAGNOSIS — C8311 Mantle cell lymphoma, lymph nodes of head, face, and neck: Secondary | ICD-10-CM | POA: Insufficient documentation

## 2019-07-22 LAB — CBC WITH DIFFERENTIAL/PLATELET
Abs Immature Granulocytes: 0.02 10*3/uL (ref 0.00–0.07)
Basophils Absolute: 0 10*3/uL (ref 0.0–0.1)
Basophils Relative: 0 %
Eosinophils Absolute: 0.1 10*3/uL (ref 0.0–0.5)
Eosinophils Relative: 2 %
HCT: 40.3 % (ref 36.0–46.0)
Hemoglobin: 12.6 g/dL (ref 12.0–15.0)
Immature Granulocytes: 0 %
Lymphocytes Relative: 22 %
Lymphs Abs: 1.2 10*3/uL (ref 0.7–4.0)
MCH: 28.9 pg (ref 26.0–34.0)
MCHC: 31.3 g/dL (ref 30.0–36.0)
MCV: 92.4 fL (ref 80.0–100.0)
Monocytes Absolute: 0.4 10*3/uL (ref 0.1–1.0)
Monocytes Relative: 6 %
Neutro Abs: 3.9 10*3/uL (ref 1.7–7.7)
Neutrophils Relative %: 70 %
Platelets: 186 10*3/uL (ref 150–400)
RBC: 4.36 MIL/uL (ref 3.87–5.11)
RDW: 14.6 % (ref 11.5–15.5)
WBC: 5.7 10*3/uL (ref 4.0–10.5)
nRBC: 0 % (ref 0.0–0.2)

## 2019-07-22 LAB — COMPREHENSIVE METABOLIC PANEL
ALT: 12 U/L (ref 0–44)
AST: 20 U/L (ref 15–41)
Albumin: 3.6 g/dL (ref 3.5–5.0)
Alkaline Phosphatase: 65 U/L (ref 38–126)
Anion gap: 8 (ref 5–15)
BUN: 11 mg/dL (ref 8–23)
CO2: 28 mmol/L (ref 22–32)
Calcium: 8.9 mg/dL (ref 8.9–10.3)
Chloride: 106 mmol/L (ref 98–111)
Creatinine, Ser: 0.66 mg/dL (ref 0.44–1.00)
GFR calc Af Amer: >60 mL/min (ref >60)
GFR calc non Af Amer: >60 mL/min (ref >60)
Glucose, Bld: 108 mg/dL — ABNORMAL HIGH (ref 70–99)
Potassium: 3.8 mmol/L (ref 3.5–5.1)
Sodium: 142 mmol/L (ref 135–145)
Total Bilirubin: 0.6 mg/dL (ref 0.3–1.2)
Total Protein: 6.5 g/dL (ref 6.5–8.1)

## 2019-07-22 LAB — LACTATE DEHYDROGENASE: LDH: 174 U/L (ref 98–192)

## 2019-07-22 NOTE — Progress Notes (Signed)
[facility] OFFICE PROGRESS NOTE  Patient Care Team: Burnard Hawthorne, FNP as PCP - General (Family Medicine) Jackolyn Confer, MD (Internal Medicine)  Cancer Staging No matching staging information was found for the patient.   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 Feb 9th- START Benda-Ritux with neulasta; Prolonged neutropenia; DISCONT- Benda-Ritux;   # April 13 th 2017- START R-CHOP x1; severe/prolonged neutropenia; PET- CR; BMBx-Neg; Disc R-CHOP  # 26th MAY 2017- Start Rituxan q 30M Main OCT 12th 2017- PET NED.  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  female pleasant patient above history of mantle cell lymphoma currently on surveillance is here for follow-up.  Patient is currently evaluated by primary care for ongoing concern for pulmonary hypertension; is awaiting 2D echo.  She denies any worsening shortness of breath or cough.  No new lumps or bumps.  No night sweats.  No chest pain Review of Systems  Constitutional: Negative for chills, diaphoresis, fever, malaise/fatigue and weight loss.  HENT: Negative for nosebleeds and sore throat.   Eyes: Negative for double vision.  Respiratory: Negative for hemoptysis, sputum production and wheezing.   Cardiovascular: Negative for chest pain, palpitations, orthopnea and leg swelling.  Gastrointestinal: Negative for abdominal pain, blood in stool, constipation,  diarrhea, heartburn, melena, nausea and vomiting.  Genitourinary: Negative for dysuria, frequency and urgency.  Musculoskeletal: Negative for back pain and joint pain.  Skin: Negative.  Negative for itching and rash.  Neurological: Negative for dizziness, tingling, focal weakness, weakness and headaches.  Endo/Heme/Allergies: Does not bruise/bleed easily.  Psychiatric/Behavioral: Negative for depression. The patient is not nervous/anxious and does not have insomnia.     PAST MEDICAL HISTORY :  Past Medical History:  Diagnosis Date  . Arthritis   . Collagen vascular disease ([place])   . GERD (gastroesophageal reflux disease)   . Headache(784.0)   . History of methotrexate therapy   . Hyperlipidemia    hx  . Lymphadenopathy of head and neck 01/2015   see on Thyroid ultrasound  . Lymphoma, mantle cell ([name]) 06/01/2015   bx of lymph node in right breast/Stage IV Mantle Cell Lymphoma  . Personal history of chemotherapy   . Rheumatoid arthritis ([place])     PAST SURGICAL HISTORY :   Past Surgical History:  Procedure Laterality Date  . CARDIAC CATHETERIZATION  09/2004   [facility]; EF 60%  . CARDIAC CATHETERIZATION  08/2004   [facility]  . IR FLUORO GUIDED NEEDLE PLC ASPIRATION/INJECTION LOC  06/18/2018  . PERIPHERAL VASCULAR CATHETERIZATION N/A 07/04/2015   Procedure: [name] Insertion;  Surgeon: [name], MD;  Location: [facility];  Service: Cardiovascular;  Laterality: N/A;  . PORTA CATH REMOVAL N/A 06/23/2018   Procedure: PORTA CATH REMOVAL;  Surgeon: [name], MD;  Location: [facility];  Service: Cardiovascular;  Laterality: N/A;    FAMILY HISTORY :   Family History  Problem Relation Age of Onset  .  Diabetes Mother   . Cholelithiasis Mother   . Hypertension Sister   . Diabetes Sister   . Arthritis Brother   . Breast cancer Neg Hx     SOCIAL HISTORY:   Social History   Tobacco Use  . Smoking status: Never Smoker  . Smokeless tobacco: Never Used  Substance  Use Topics  . Alcohol use: No  . Drug use: No    ALLERGIES:  has No Known Allergies.  MEDICATIONS:  Current Outpatient Medications  Medication Sig Dispense Refill  . acetaminophen (TYLENOL) 500 MG tablet Take 500 mg by mouth every 6 (six) hours as needed.    . Calcium Carbonate-Vit D-Min (CALTRATE 600+D PLUS) 600-800 MG-UNIT CHEW Chew 1 tablet by mouth 2 (two) times daily. 60 tablet 6  . cyanocobalamin 500 MCG tablet Take 500 mcg by mouth daily. Take two gummies daily    . diclofenac sodium (VOLTAREN) 1 % GEL Apply 4 g topically 4 (four) times daily. 100 g 1  . fluticasone (FLONASE) 50 MCG/ACT nasal spray SPRAY TWICE IN EACH NOSTRIL ONCE DAILY  4  . meclizine (ANTIVERT) 25 MG tablet Take 25 mg by mouth 3 (three) times daily as needed for dizziness.    [name] MEGARED OMEGA-3 KRILL OIL 500 MG CAPS Take 500 mg by mouth daily.    . meloxicam (MOBIC) 7.5 MG tablet TAKE 1 TABLET BY MOUTH EVERY DAY AS NEEDED FOR PAIN 30 tablet 1  . Multiple Vitamins-Minerals (MULTIVITAMIN GUMMIES ADULT) CHEW Chew by mouth. Reported on 09/06/2015    . omeprazole (PRILOSEC) 40 MG capsule TAKE 1 CAPSULE BY MOUTH EVERY DAY 90 capsule 1  . pravastatin (PRAVACHOL) 40 MG tablet Take 1 tablet (40 mg total) by mouth daily. 90 tablet 1  . VENTOLIN HFA 108 (90 Base) MCG/ACT inhaler TAKE 2 PUFFS BY MOUTH EVERY 6 HOURS AS NEEDED FOR WHEEZE OR SHORTNESS OF BREATH 18 Inhaler 5   No current facility-administered medications for this visit.   Facility-Administered Medications Ordered in Other Visits  Medication Dose Route Frequency Provider Last Rate Last Admin  . heparin lock flush 100 unit/mL  500 Units Intravenous Once Charlaine Dalton R, MD      . sodium chloride flush (NS) 0.9 % injection 10 mL  10 mL Intravenous Once Charlaine Dalton R, MD      . sodium chloride flush (NS) 0.9 % injection 10 mL  10 mL Intravenous Once Cammie Sickle, MD      . Tbo-Filgrastim [facility]) injection 480 mcg  480 mcg Subcutaneous Once  Cammie Sickle, MD        PHYSICAL EXAMINATION: ECOG PERFORMANCE STATUS: 0 - Asymptomatic  BP (!) 155/89 (Patient Position: Sitting)   Pulse 66   Temp 97.9 F (36.6 C) (Tympanic)   Resp 20   Ht 5\' 6"  (1.676 m)   Wt 254 lb (115.2 kg)   BMI 41.00 kg/m   Filed Weights   07/22/19 1429  Weight: 254 lb (115.2 kg)    Physical Exam  Constitutional: She is oriented to person, place, and time and well-developed, well-nourished, and in no distress.  Obese.  Walking herself with cane.   HENT:  Head: Normocephalic and atraumatic.  Mouth/Throat: Oropharynx is clear and moist. No oropharyngeal exudate.  Eyes: Pupils are equal, round, and reactive to light.  Cardiovascular: Normal rate and regular rhythm.  Pulmonary/Chest: No respiratory distress. She has no wheezes.  Abdominal: Soft. Bowel sounds are normal. She exhibits no distension and no mass. There is no  abdominal tenderness. There is no rebound and no guarding.  Musculoskeletal:        General: No tenderness or edema. Normal range of motion.     Cervical back: Normal range of motion and neck supple.  Neurological: She is alert and oriented to person, place, and time.  Skin: Skin is warm.  Psychiatric: Affect normal.   LABORATORY DATA:  I have reviewed the data as listed    Component Value Date/Time   NA 142 07/22/2019 1412   NA 142 09/14/2013 1127   K 3.8 07/22/2019 1412   K 3.5 09/14/2013 1127   CL 106 07/22/2019 1412   CL 110 (H) 09/14/2013 1127   CO2 28 07/22/2019 1412   CO2 29 09/14/2013 1127   GLUCOSE 108 (H) 07/22/2019 1412   GLUCOSE 106 (H) 09/14/2013 1127   BUN 11 07/22/2019 1412   BUN 8 09/14/2013 1127   CREATININE 0.66 07/22/2019 1412   CREATININE 0.71 09/14/2013 1127   CREATININE 0.68 04/01/2013 1550   CALCIUM 8.9 07/22/2019 1412   CALCIUM 8.6 09/14/2013 1127   PROT 6.5 07/22/2019 1412   PROT 7.6 09/14/2013 1127   ALBUMIN 3.6 07/22/2019 1412   ALBUMIN 3.2 (L) 09/14/2013 1127   AST 20 07/22/2019  1412   AST 22 09/14/2013 1127   ALT 12 07/22/2019 1412   ALT 19 09/14/2013 1127   ALKPHOS 65 07/22/2019 1412   ALKPHOS 76 09/14/2013 1127   BILITOT 0.6 07/22/2019 1412   BILITOT 0.4 09/14/2013 1127   GFRNONAA >60 07/22/2019 1412   GFRNONAA >60 09/14/2013 1127   GFRAA >60 07/22/2019 1412   GFRAA >60 09/14/2013 1127    No results found for: SPEP, UPEP  Lab Results  Component Value Date   WBC 5.7 07/22/2019   NEUTROABS 3.9 07/22/2019   HGB 12.6 07/22/2019   HCT 40.3 07/22/2019   MCV 92.4 07/22/2019   PLT 186 07/22/2019      Chemistry      Component Value Date/Time   NA 142 07/22/2019 1412   NA 142 09/14/2013 1127   K 3.8 07/22/2019 1412   K 3.5 09/14/2013 1127   CL 106 07/22/2019 1412   CL 110 (H) 09/14/2013 1127   CO2 28 07/22/2019 1412   CO2 29 09/14/2013 1127   BUN 11 07/22/2019 1412   BUN 8 09/14/2013 1127   CREATININE 0.66 07/22/2019 1412   CREATININE 0.71 09/14/2013 1127   CREATININE 0.68 04/01/2013 1550      Component Value Date/Time   CALCIUM 8.9 07/22/2019 1412   CALCIUM 8.6 09/14/2013 1127   ALKPHOS 65 07/22/2019 1412   ALKPHOS 76 09/14/2013 1127   AST 20 07/22/2019 1412   AST 22 09/14/2013 1127   ALT 12 07/22/2019 1412   ALT 19 09/14/2013 1127   BILITOT 0.6 07/22/2019 1412   BILITOT 0.4 09/14/2013 1127       RADIOGRAPHIC STUDIES: I have personally reviewed the radiological images as listed and agreed with the findings in the report. No results found.   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  Stable except continued mild bil lung base scarring/see below/dilated pulmonary artery.  Stable  #  I would continue surveillance at this time; given the high risk of recurrence.  Follow-up CT scans ordered.  # Chronic arthritis-NSAIDs as needed.  Stable.   #  Bilateral lung base scarring- ? Chronic/-stable Dilated PA; ? pulmonary hypertension-discussed importance of getting 2D echo as ordered by PCP.  Patient in agreement.   # DISPOSITION: #  follow up in 3 months-MD /labs-cbc/cmp/ldh; CT C/A/P-Dr.B  # 25 minutes face-to-face with the patient discussing the above plan of care; more than 50% of time spent on prognosis/ natural history; counseling and coordination.      Orders Placed This Encounter  Procedures  . CT Chest W Contrast    Standing Status:   Future    Standing Expiration Date:   07/21/2020    Order Specific Question:   ** REASON FOR EXAM (FREE TEXT)    Answer:   mantle cell lymphoma    Order Specific Question:   If indicated for the ordered procedure, I authorize the administration of contrast media per Radiology protocol    Answer:   Yes    Order Specific Question:   Preferred imaging location?    Answer:   [facility]    Order Specific Question:   Radiology Contrast Protocol - do NOT remove file path    Answer:   [email or web address]  . CT ABDOMEN PELVIS W CONTRAST    Standing Status:   Future    Standing Expiration Date:   07/21/2020    Order Specific Question:   If indicated for the ordered procedure, I authorize the administration of contrast media per Radiology protocol    Answer:   Yes    Order Specific Question:   Preferred imaging location?    Answer:   [facility]    Order Specific Question:   Radiology Contrast Protocol - do NOT remove file path    Answer:   [email or web address]    Order Specific Question:   ** REASON FOR EXAM (FREE TEXT)    Answer:   mantle cell lympoma  . CBC with Differential    Standing Status:   Future    Standing Expiration Date:   07/21/2020  . Comprehensive metabolic panel    Standing Status:   Future    Standing Expiration Date:   07/21/2020  . Lactate dehydrogenase    Standing Status:   Future    Standing  Expiration Date:   07/21/2020   All questions were answered. The patient knows to call the clinic with any problems, questions or concerns.      Cammie Sickle, MD 07/25/2019 7:43 AM

## 2019-07-22 NOTE — Assessment & Plan Note (Addendum)
#  Mantle cell lymphoma stage IV-currently on surveillance [last Rituxan August 2019]; September 2020 CT scan no evidence of recurrence of lymphoma/no splenomegaly.  Stable except continued mild bil lung base scarring/see below/dilated pulmonary artery.  Stable  #  I would continue surveillance at this time; given the high risk of recurrence.  Follow-up CT scans ordered.  # Chronic arthritis-NSAIDs as needed.  Stable.   #  Bilateral lung base scarring- ? Chronic/-stable Dilated PA; ? pulmonary hypertension-discussed importance of getting 2D echo as ordered by PCP.  Patient in agreement.   # DISPOSITION: #  follow up in 3 months-MD /labs-cbc/cmp/ldh; CT C/A/P-Dr.B  # 25 minutes face-to-face with the patient discussing the above plan of care; more than 50% of time spent on prognosis/ natural history; counseling and coordination.

## 2019-07-25 ENCOUNTER — Other Ambulatory Visit: Payer: Self-pay

## 2019-07-25 ENCOUNTER — Encounter: Payer: Self-pay | Admitting: Internal Medicine

## 2019-07-25 ENCOUNTER — Ambulatory Visit (INDEPENDENT_AMBULATORY_CARE_PROVIDER_SITE_OTHER): Payer: Medicare HMO | Admitting: Internal Medicine

## 2019-07-25 VITALS — BP 120/70 | HR 65 | Ht 67.0 in | Wt 252.2 lb

## 2019-07-25 DIAGNOSIS — I447 Left bundle-branch block, unspecified: Secondary | ICD-10-CM

## 2019-07-25 DIAGNOSIS — R030 Elevated blood-pressure reading, without diagnosis of hypertension: Secondary | ICD-10-CM | POA: Diagnosis not present

## 2019-07-25 DIAGNOSIS — R0600 Dyspnea, unspecified: Secondary | ICD-10-CM | POA: Diagnosis not present

## 2019-07-25 DIAGNOSIS — R0609 Other forms of dyspnea: Secondary | ICD-10-CM

## 2019-07-25 NOTE — Patient Instructions (Signed)
Medication Instructions:  Your physician recommends that you continue on your current medications as directed. Please refer to the Current Medication list given to you today.  *If you need a refill on your cardiac medications before your next appointment, please call your pharmacy*  Lab Work: none If you have labs (blood work) drawn today and your tests are completely normal, you will receive your results only by: . MyChart Message (if you have MyChart) OR . A paper copy in the mail If you have any lab test that is abnormal or we need to change your treatment, we will call you to review the results.  Testing/Procedures: Your physician has requested that you have an echocardiogram. Echocardiography is a painless test that uses sound waves to create images of your heart. It provides your doctor with information about the size and shape of your heart and how well your heart's chambers and valves are working. This procedure takes approximately one hour. There are no restrictions for this procedure. You may get an IV, if needed, to receive an ultrasound enhancing agent through to better visualize your heart.   Follow-Up: At [facility], you and your health needs are our priority.  As part of our continuing mission to provide you with exceptional heart care, we have created designated Provider Care Teams.  These Care Teams include your primary Cardiologist (physician) and Advanced Practice Providers (APPs -  Physician Assistants and Nurse Practitioners) who all work together to provide you with the care you need, when you need it.  Your next appointment:   3 month(s)  The format for your next appointment:   In Person  Provider:    You may see DR CHRISTOPHER END or one of the following Advanced Practice Providers on your designated Care Team:    Christopher Berge, NP  Ryan Dunn, PA-C  Jacquelyn Visser, PA-C   Echocardiogram An echocardiogram is a procedure that uses painless sound  waves (ultrasound) to produce an image of the heart. Images from an echocardiogram can provide important information about:  Signs of coronary artery disease (CAD).  Aneurysm detection. An aneurysm is a weak or damaged part of an artery wall that bulges out from the normal force of blood pumping through the body.  Heart size and shape. Changes in the size or shape of the heart can be associated with certain conditions, including heart failure, aneurysm, and CAD.  Heart muscle function.  Heart valve function.  Signs of a past heart attack.  Fluid buildup around the heart.  Thickening of the heart muscle.  A tumor or infectious growth around the heart valves. Tell a health care provider about:  Any allergies you have.  All medicines you are taking, including vitamins, herbs, eye drops, creams, and over-the-counter medicines.  Any blood disorders you have.  Any surgeries you have had.  Any medical conditions you have.  Whether you are pregnant or may be pregnant. What are the risks? Generally, this is a safe procedure. However, problems may occur, including:  Allergic reaction to dye (contrast) that may be used during the procedure. What happens before the procedure? No specific preparation is needed. You may eat and drink normally. What happens during the procedure?   An IV tube may be inserted into one of your veins.  You may receive contrast through this tube. A contrast is an injection that improves the quality of the pictures from your heart.  A gel will be applied to your chest.  A wand-like tool (transducer)   will be moved over your chest. The gel will help to transmit the sound waves from the transducer.  The sound waves will harmlessly bounce off of your heart to allow the heart images to be captured in real-time motion. The images will be recorded on a computer. The procedure may vary among health care providers and hospitals. What happens after the  procedure?  You may return to your normal, everyday life, including diet, activities, and medicines, unless your health care provider tells you not to do that. Summary  An echocardiogram is a procedure that uses painless sound waves (ultrasound) to produce an image of the heart.  Images from an echocardiogram can provide important information about the size and shape of your heart, heart muscle function, heart valve function, and fluid buildup around your heart.  You do not need to do anything to prepare before this procedure. You may eat and drink normally.  After the echocardiogram is completed, you may return to your normal, everyday life, unless your health care provider tells you not to do that. This information is not intended to replace advice given to you by your health care provider. Make sure you discuss any questions you have with your health care provider. Document Revised: 09/09/2018 Document Reviewed: 06/21/2016 Elsevier Patient Education  2020 [employer]   

## 2019-07-25 NOTE — Progress Notes (Signed)
New Outpatient Visit Date: 07/25/2019  Referring Provider: Burnard Hawthorne, FNP [address] [place],  [place] [id number]  Chief Complaint: Shortness of breath  HPI:  Michelle [name] is a 79 y.o. female who is being seen today for the evaluation of elevated blood pressure and leg swelling at the request of Michelle [name]. She has a history of rheumatoid arthritis, mantle cell lymphoma, hyperlipidemia, and GERD.  Today, Michelle [name] reports that she is feeling fairly well.  She reports chronic dyspnea on exertion that she attributes to deconditioning.  She is only able to walk ~50 feet before needing to stop to catch her breath.  She has stable 2-pillow orthopnea and notes PND several years ago before she started sleeping on two pillows.  She denies chest pain.  She notes that her heart races when she walks quickly, though she does not have palpitations at rest.  [name] is concerned about intermittent dizziness over the last few months.  This was not present when she saw Dr. Ubaldo Glassing in 01/2019.  She has not fallen or passed out.  She has also noticed an "aggrivating" feeling in the left side of her face, which she is not able to characterize further, though she states that it is not painful.  She denies trauma to the area.  --------------------------------------------------------------------------------------------------  Cardiovascular History & Procedures: Cardiovascular Problems:  LBBB  Dyspnea on exertion  Risk Factors:  Hyperlipidemia, age, and obesity  Cath/PCI:  LHC (09/05/2004): No significant coronary artery disease.  LVEF 60%.  CV Surgery:  None  EP Procedures and Devices:  None  Non-Invasive Evaluation(s):  TTE (12/16/2018): Normal LV size with mild LVH.  LVEF 50-55% with grade 1 diastolic dysfunction.  Normal RV size and function.  Mild left atrial enlargement.  Recent CV Pertinent Labs: Lab Results  Component Value Date   CHOL 142 12/17/2018   HDL 61.40  12/17/2018   LDLCALC 71 12/17/2018   TRIG 49.0 12/17/2018   CHOLHDL 2 12/17/2018   INR 0.97 10/08/2015   K 3.8 07/22/2019   K 3.5 09/14/2013   BUN 11 07/22/2019   BUN 8 09/14/2013   CREATININE 0.66 07/22/2019   CREATININE 0.71 09/14/2013   CREATININE 0.68 04/01/2013    --------------------------------------------------------------------------------------------------  Past Medical History:  Diagnosis Date  . Arthritis   . Collagen vascular disease ([place])   . GERD (gastroesophageal reflux disease)   . Headache(784.0)   . History of methotrexate therapy   . Hyperlipidemia    hx  . Lymphadenopathy of head and neck 01/2015   see on Thyroid ultrasound  . 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 Personal history of chemotherapy   . Rheumatoid arthritis [facility])     Past Surgical History:  Procedure Laterality Date  . CARDIAC CATHETERIZATION  09/2004   [facility]; EF 60%  . CARDIAC CATHETERIZATION  08/2004   [facility]  . IR FLUORO GUIDED NEEDLE PLC ASPIRATION/INJECTION LOC  06/18/2018  . PERIPHERAL VASCULAR CATHETERIZATION N/A 07/04/2015   Procedure: [name] Insertion;  Surgeon: [name], MD;  Location: [place];  Service: Cardiovascular;  Laterality: N/A;  . PORTA CATH REMOVAL N/A 06/23/2018   Procedure: PORTA CATH REMOVAL;  Surgeon: [name], MD;  Location: [place] [facility];  Service: Cardiovascular;  Laterality: N/A;    Current Meds  Medication Sig  . acetaminophen (TYLENOL) 500 MG tablet Take 500 mg by mouth every  6 (six) hours as needed.  . Calcium Carbonate-Vit D-Min (CALTRATE 600+D PLUS) 600-800 MG-UNIT CHEW Chew 1 tablet by mouth 2 (two) times daily.  . cyanocobalamin 500 MCG tablet Take 500 mcg by mouth daily. Take two gummies daily  . diclofenac sodium (VOLTAREN) 1 % GEL Apply 4 g topically 4 (four) times daily.  . fluticasone (FLONASE) 50 MCG/ACT nasal spray SPRAY TWICE IN EACH NOSTRIL ONCE  DAILY  . meclizine (ANTIVERT) 25 MG tablet Take 25 mg by mouth 3 (three) times daily as needed for dizziness.  [name] MEGARED OMEGA-3 KRILL OIL 500 MG CAPS Take 500 mg by mouth daily.  . meloxicam (MOBIC) 7.5 MG tablet TAKE 1 TABLET BY MOUTH EVERY DAY AS NEEDED FOR PAIN  . Multiple Vitamins-Minerals (MULTIVITAMIN GUMMIES ADULT) CHEW Chew by mouth. Reported on 09/06/2015  . omeprazole (PRILOSEC) 40 MG capsule TAKE 1 CAPSULE BY MOUTH EVERY DAY  . pravastatin (PRAVACHOL) 40 MG tablet Take 1 tablet (40 mg total) by mouth daily.  . VENTOLIN HFA 108 (90 Base) MCG/ACT inhaler TAKE 2 PUFFS BY MOUTH EVERY 6 HOURS AS NEEDED FOR WHEEZE OR SHORTNESS OF BREATH    Allergies: Patient has no known allergies.  Social History   Tobacco Use  . Smoking status: Never Smoker  . Smokeless tobacco: Never Used  Substance Use Topics  . Alcohol use: No  . Drug use: No    Family History  Problem Relation Age of Onset  . Diabetes Mother   . Cholelithiasis Mother   . Hypertension Sister   . Diabetes Sister   . Heart murmur Sister   . Arthritis Brother   . Breast cancer Neg Hx     Review of Systems: A 12-system review of systems was performed and was negative except as noted in the HPI.  --------------------------------------------------------------------------------------------------  Physical Exam: BP 120/70 (BP Location: Right Arm, Patient Position: Sitting, Cuff Size: Large)   Pulse 65   Ht 5\' 7"  (1.702 m)   Wt 252 lb 4 oz (114.4 kg)   SpO2 96%   BMI 39.51 kg/m   General:  NAD. HEENT: No conjunctival pallor or scleral icterus. Facemask in place. Neck: Supple without lymphadenopathy, thyromegaly, JVD, or HJR. No carotid bruit. Lungs: Normal work of breathing. Clear to auscultation bilaterally without wheezes or crackles. Heart: Regular rate and rhythm without murmurs, rubs, or gallops. Non-displaced PMI. Abd: Bowel sounds present. Soft, NT/ND without hepatosplenomegaly Ext: No lower extremity  edema. Radial, PT, and DP pulses are 2+ bilaterally Skin: Warm and dry without rash. Neuro: CNIII-XII intact. Strength and fine-touch sensation intact in upper and lower extremities bilaterally. Psych: Normal mood and affect.  EKG:  NSR with right axis deviation and left bundle branch block.  Lab Results  Component Value Date   WBC 5.7 07/22/2019   HGB 12.6 07/22/2019   HCT 40.3 07/22/2019   MCV 92.4 07/22/2019   PLT 186 07/22/2019    Lab Results  Component Value Date   NA 142 07/22/2019   K 3.8 07/22/2019   CL 106 07/22/2019   CO2 28 07/22/2019   BUN 11 07/22/2019   CREATININE 0.66 07/22/2019   GLUCOSE 108 (H) 07/22/2019   ALT 12 07/22/2019    Lab Results  Component Value Date   CHOL 142 12/17/2018   HDL 61.40 12/17/2018   LDLCALC 71 12/17/2018   TRIG 49.0 12/17/2018   CHOLHDL 2 12/17/2018     --------------------------------------------------------------------------------------------------  ASSESSMENT AND PLAN: Dyspnea on exertion: This has been a chronic issue for  Ms. Alvino but is quite profound, as she is only able to walk about 50 feet before needing to rest.  She appears euvolemic on exam today, though obesity limits evaluation.  Echocardiogram in 12/2018 showed preserved LVEF.  However, given some progression in her symptoms since then, I think it would be prudent to repeat an echocardiogram, as she is at risk for cardiomyopathy given her history of prior chemotherapy.  If her LVEF is preserved without a focal wall motion abnormality, we will proceed with myocardial perfusion stress test or cardiac CT to exclude dyspnea as an anginal equivalent.  Alternatively, if her echo is significantly abnormal, we will need to consider proceeding with catheterization instead.  I will defer medication changes today.  LBBB: Chronic; we will proceed with echocardiogram and ischemia evaluation, as outlined above.  Elevated blood pressure reading: Blood pressure is normal  today.  I will defer medication changes at this time pending aforementioned echocardiogram.  Follow-up: Return to clinic in 3 months.  Nelva Bush, MD 07/25/2019 4:37 PM

## 2019-07-26 ENCOUNTER — Encounter: Payer: Self-pay | Admitting: Internal Medicine

## 2019-08-23 ENCOUNTER — Encounter: Payer: Self-pay | Admitting: Family

## 2019-08-26 ENCOUNTER — Other Ambulatory Visit: Payer: Self-pay

## 2019-08-26 ENCOUNTER — Ambulatory Visit (INDEPENDENT_AMBULATORY_CARE_PROVIDER_SITE_OTHER): Payer: Medicare HMO

## 2019-08-26 DIAGNOSIS — R0600 Dyspnea, unspecified: Secondary | ICD-10-CM | POA: Diagnosis not present

## 2019-08-26 DIAGNOSIS — I447 Left bundle-branch block, unspecified: Secondary | ICD-10-CM | POA: Diagnosis not present

## 2019-08-26 DIAGNOSIS — R0609 Other forms of dyspnea: Secondary | ICD-10-CM

## 2019-08-31 ENCOUNTER — Telehealth: Payer: Self-pay | Admitting: *Deleted

## 2019-08-31 NOTE — Telephone Encounter (Signed)
No answer. Left message to call back.   

## 2019-08-31 NOTE — Telephone Encounter (Signed)
-----   Message from Nelva Bush, MD sent at 08/30/2019  1:17 PM EDT ----- Please let Ms. Mansour know that her echocardiogram shows that her heart pumping function has decreased quite a bit since July.  This likely explains her shortness of breath.  We will need to make some medication changes and consider proceeding with cardiac catheterization for further evaluation of her cardiomyopathy.  I recommend that Ms. Bonham begin aspirin 81 mg daily and losartan 12.5 mg daily.  We should try to have her see me or an APP in the next week to reassess her symptoms and discuss proceeding with catheterization.

## 2019-09-01 ENCOUNTER — Other Ambulatory Visit: Payer: Self-pay | Admitting: Family

## 2019-09-01 DIAGNOSIS — Z1231 Encounter for screening mammogram for malignant neoplasm of breast: Secondary | ICD-10-CM

## 2019-09-01 MED ORDER — ASPIRIN EC 81 MG PO TBEC: 81.0000 mg | DELAYED_RELEASE_TABLET | Freq: Every day | ORAL | 3 refills | Status: DC

## 2019-09-01 MED ORDER — LOSARTAN POTASSIUM 25 MG PO TABS: 12.5000 mg | ORAL_TABLET | Freq: Every day | ORAL | 2 refills | Status: DC

## 2019-09-01 NOTE — Telephone Encounter (Signed)
Results called to pt. Pt verbalized understanding of results, to start aspirin 81 mg daily, start losartan 12.5 mg (0.5 tablet) by mouth daily and to come in for an appointment. Her sister was on the phone as well and verbalized understanding. Patient scheduled to come in next week to see Dr End. They are aware to pick up new prescription at [facility]. Rx sent to pharmacy.

## 2019-09-08 ENCOUNTER — Ambulatory Visit (INDEPENDENT_AMBULATORY_CARE_PROVIDER_SITE_OTHER): Payer: Medicare HMO | Admitting: Internal Medicine

## 2019-09-08 ENCOUNTER — Encounter: Payer: Self-pay | Admitting: Internal Medicine

## 2019-09-08 ENCOUNTER — Other Ambulatory Visit: Payer: Self-pay

## 2019-09-08 VITALS — BP 120/70 | HR 62 | Ht 65.0 in | Wt 248.2 lb

## 2019-09-08 DIAGNOSIS — I5022 Chronic systolic (congestive) heart failure: Secondary | ICD-10-CM

## 2019-09-08 DIAGNOSIS — R0600 Dyspnea, unspecified: Secondary | ICD-10-CM | POA: Diagnosis not present

## 2019-09-08 DIAGNOSIS — Z01810 Encounter for preprocedural cardiovascular examination: Secondary | ICD-10-CM | POA: Diagnosis not present

## 2019-09-08 MED ORDER — FUROSEMIDE 20 MG PO TABS: 20.0000 mg | ORAL_TABLET | Freq: Every day | ORAL | 1 refills | Status: DC

## 2019-09-08 NOTE — Progress Notes (Signed)
Follow-up Outpatient Visit Date: 09/08/2019  Primary Care Provider: Burnard Hawthorne, FNP [address] [place] [id number]  Chief Complaint: Shortness of breath, chest pain, and abnormal echocardiogram  HPI:  Michelle [name] is a 79 y.o. female with history of rheumatoid arthritis, mantle cell lymphoma, hyperlipidemia, and GERD, who presents for follow-up of shortness of breath and leg swelling.  I met Michelle [name] in late February, at which time she reported chronic exertional dyspnea and two-pillow orthopnea.  Prior cardiac testing includes left heart cath in 2006 that was without coronary artery disease.  Echo in 12/2018 showed low normal LVEF with grade 1 diastolic dysfunction and mild LVH.  We agreed to repeat an echocardiogram, given worsening or new symptoms.  This revealed reduction in LV systolic function to 20-10% with global hypokinesis and severe hypokinesis of the anterior and anteroseptal wall.  Mild pulmonary hypertension was also noted.  She was started on losartan 12.5 mg daily.  Today, Michelle [name] reports that she feels somewhat better since starting losartan.  Nocturnal chest tightness has resolved.  She also feels like her breathing is somewhat better.  She still has exertional dyspnea when walking from one [place] of her house to the other.  She also reports 2 pillow orthopnea and occasional PND.  She has not had any palpitations, lightheadedness, or falls.  --------------------------------------------------------------------------------------------------  Cardiovascular History & Procedures: Cardiovascular Problems:  LBBB  Cardiomyopathy  Risk Factors:  Hyperlipidemia, age, and obesity  Cath/PCI:  LHC (09/05/2004): No significant coronary artery disease.  LVEF 60%.  CV Surgery:  None  EP Procedures and Devices:  None  Non-Invasive Evaluation(s):  TTE (08/26/2019): Normal LV size and wall thickness with LVEF of 30-35%.  There is global  hypokinesis with severe hypokinesis of the anterior and anteroseptal walls.  Normal RV size and function.  Mild pulmonary hypertension.  Mild mitral regurgitation.  Moderate tricuspid regurgitation.  TTE (12/16/2018): Normal LV size with mild LVH.  LVEF 50-55% with grade 1 diastolic dysfunction.  Normal RV size and function.  Mild left atrial enlargement.  Recent CV Pertinent Labs: Lab Results  Component Value Date   CHOL 142 12/17/2018   HDL 61.40 12/17/2018   LDLCALC 71 12/17/2018   TRIG 49.0 12/17/2018   CHOLHDL 2 12/17/2018   INR 0.97 10/08/2015   K 3.8 07/22/2019   K 3.5 09/14/2013   BUN 11 07/22/2019   BUN 8 09/14/2013   CREATININE 0.66 07/22/2019   CREATININE 0.71 09/14/2013   CREATININE 0.68 04/01/2013    Past medical and surgical history were reviewed and updated in EPIC.  Current Meds  Medication Sig  . acetaminophen (TYLENOL) 500 MG tablet Take 500 mg by mouth every 6 (six) hours as needed.  [name] aspirin EC 81 MG tablet Take 1 tablet (81 mg total) by mouth daily.  . Calcium Carbonate-Vit D-Min (CALTRATE 600+D PLUS) 600-800 MG-UNIT CHEW Chew 1 tablet by mouth 2 (two) times daily.  . cyanocobalamin 500 MCG tablet Take 500 mcg by mouth daily. Take two gummies daily  . diclofenac sodium (VOLTAREN) 1 % GEL Apply 4 g topically 4 (four) times daily.  . fluticasone (FLONASE) 50 MCG/ACT nasal spray SPRAY TWICE IN EACH NOSTRIL ONCE DAILY  . losartan (COZAAR) 25 MG tablet Take 0.5 tablets (12.5 mg total) by mouth daily.  . meclizine (ANTIVERT) 25 MG tablet Take 25 mg by mouth 3 (three) times daily as needed for dizziness.  [name] MEGARED OMEGA-3 KRILL OIL 500 MG CAPS Take 500 mg  by mouth daily.  . meloxicam (MOBIC) 7.5 MG tablet TAKE 1 TABLET BY MOUTH EVERY DAY AS NEEDED FOR PAIN  . Multiple Vitamins-Minerals (MULTIVITAMIN GUMMIES ADULT) CHEW Chew by mouth. Reported on 09/06/2015  . omeprazole (PRILOSEC) 40 MG capsule TAKE 1 CAPSULE BY MOUTH EVERY DAY  . pravastatin (PRAVACHOL) 40 MG  tablet Take 1 tablet (40 mg total) by mouth daily.  . VENTOLIN HFA 108 (90 Base) MCG/ACT inhaler TAKE 2 PUFFS BY MOUTH EVERY 6 HOURS AS NEEDED FOR WHEEZE OR SHORTNESS OF BREATH    Allergies: Patient has no known allergies.  Social History   Tobacco Use  . Smoking status: Never Smoker  . Smokeless tobacco: Never Used  Substance Use Topics  . Alcohol use: No  . Drug use: No    Family History  Problem Relation Age of Onset  . Diabetes Mother   . Cholelithiasis Mother   . Hypertension Sister   . Diabetes Sister   . Heart murmur Sister   . Arthritis Brother   . Breast cancer Neg Hx     Review of Systems: A 12-system review of systems was performed and was negative except as noted in the HPI.  --------------------------------------------------------------------------------------------------  Physical Exam: BP 120/70 (BP Location: Left Arm, Patient Position: Sitting, Cuff Size: Normal)   Pulse 62   Ht '5\' 5"'  (1.651 m)   Wt 248 lb 4 oz (112.6 kg)   SpO2 96%   BMI 41.31 kg/m   General: NAD. Neck: JVP approximately 6 cm without HJR. Lungs: Normal work of breathing.  Coarse breath sounds without wheezes or crackles. Heart: Regular rate and rhythm with 3/6 systolic murmur. Abdomen: Soft, nontender, nondistended. Extremities: Trace right and 1+ left ankle edema.  EKG: Normal sinus rhythm with left bundle branch block.  Compared with prior tracing from 07/25/2019, axis has shifted to the left.  Otherwise, there has been no significant interval change.  Lab Results  Component Value Date   WBC 5.7 07/22/2019   HGB 12.6 07/22/2019   HCT 40.3 07/22/2019   MCV 92.4 07/22/2019   PLT 186 07/22/2019    Lab Results  Component Value Date   NA 142 07/22/2019   K 3.8 07/22/2019   CL 106 07/22/2019   CO2 28 07/22/2019   BUN 11 07/22/2019   CREATININE 0.66 07/22/2019   GLUCOSE 108 (H) 07/22/2019   ALT 12 07/22/2019    Lab Results  Component Value Date   CHOL 142 12/17/2018     HDL 61.40 12/17/2018   LDLCALC 71 12/17/2018   TRIG 49.0 12/17/2018   CHOLHDL 2 12/17/2018    --------------------------------------------------------------------------------------------------  ASSESSMENT AND PLAN: Chronic systolic heart failure: Michelle [name] appears mildly volume overloaded with NYHA class III heart failure symptoms.  She is tolerating low-dose losartan well.  Resting heart rate in the low 60s precludes addition of a beta-blocker.  We will continue current dose of losartan and start furosemide 20 mg daily.  I will check a BMP today.  We have also discussed importance of ischemia evaluation and will proceed with right and left heart catheterization at the patient's convenience in the next few weeks.  I have reviewed the risks, indications, and alternatives to cardiac catheterization, possible angioplasty, and stenting with the patient. Risks include but are not limited to bleeding, infection, vascular injury, stroke, myocardial infection, arrhythmia, kidney injury, radiation-related injury in the case of prolonged fluoroscopy use, emergency cardiac surgery, and death. The patient understands the risks of serious complication is 1-2 in 9201  with diagnostic cardiac cath and 1-2% or less with angioplasty/stenting.  Follow-up: Return to clinic 2 weeks after catheterization.  Nelva Bush, MD 09/08/2019 1:23 PM

## 2019-09-08 NOTE — Patient Instructions (Addendum)
Medication Instructions:  Your physician has recommended you make the following change in your medication:  1- START Furosemide 20 mg (1 tablet) by mouth once a day.  *If you need a refill on your cardiac medications before your next appointment, please call your pharmacy*   Lab Work: 1- Your physician recommends that you return for lab work in: [place], BMET, INR.  2-  COVID PRE- TEST: You will need a COVID TEST prior to the procedure:  LOCATION: [facility].  DATE/TIME:  ____________________________________   If you have labs (blood work) drawn today and your tests are completely normal, you will receive your results only by: [name] MyChart Message (if you have MyChart) OR . A paper copy in the mail If you have any lab test that is abnormal or we need to change your treatment, we will call you to review the results.   Testing/Procedures: Your physician has requested that you have a RIGHT AND LEFT cardiac catheterization. Cardiac catheterization is used to diagnose and/or treat various heart conditions. Doctors may recommend this procedure for a number of different reasons. The most common reason is to evaluate chest pain. Chest pain can be a symptom of coronary artery disease (CAD), and cardiac catheterization can show whether plaque is narrowing or blocking your heart's arteries. This procedure is also used to evaluate the valves, as well as measure the blood flow and oxygen levels in different parts of your heart. For further information please visit [email or web address]. Please follow instruction sheet, as given.  POSSIBLE DATE April 13, 20, OR 27.   You are scheduled for a Cardiac Catheterization on ______________, ____________ with Dr. Harrell Gave End.  1. Please arrive at the [facility] at _________________ (This time is ONE hours before your procedure to ensure your preparation). Free valet parking service is available.   Special note:  Every effort is made to have your procedure done on time. Please understand that emergencies sometimes delay scheduled procedures.  2. Diet: Do not eat solid foods after midnight.  The patient may have clear liquids until 5am upon the day of the procedure.  3. Labs: You will need to have blood drawn on TODAY. You do not need to be fasting.  4. Medication instructions in preparation for your procedure:   Contrast Allergy: No   On the morning of your procedure, take your Aspirin and any morning medicines NOT listed above.  You may use sips of water.  5. Plan for one night stay--bring personal belongings. 6. Bring a current list of your medications and current insurance cards. 7. You MUST have a responsible person to drive you home. 8. Someone MUST be with you the first 24 hours after you arrive home or your discharge will be delayed. 9. Please wear clothes that are easy to get on and off and wear slip-on shoes.  Thank you for allowing [place] to care for you!   -- [facility]    Follow-Up: At [facility], you and your health needs are our priority.  As part of our continuing mission to provide you with exceptional heart care, we have created designated Provider Care Teams.  These Care Teams include your primary Cardiologist (physician) and Advanced Practice Providers (APPs -  Physician Assistants and Nurse Practitioners) who all work together to provide you with the care you need, when you need it.  We recommend signing up for the patient portal called "MyChart".  Sign up information  is provided on this After Visit Summary.  MyChart is used to connect with patients for Virtual Visits (Telemedicine).  Patients are able to view lab/test results, encounter notes, upcoming appointments, etc.  Non-urgent messages can be sent to your provider as well.   To learn more about what you can do with MyChart, go to [email or web address].    Your next appointment:   2  week(s) after cath procedure date.  The format for your next appointment:   In Person  Provider:    You may see DR Harrell Gave END  or one of the following Advanced Practice Providers on your designated Care Team:    Murray Hodgkins, NP  Christell Faith, PA-C  Marrianne Mood, PA-C    Coronary Angiogram With Stent Coronary angiogram with stent placement is a procedure to widen or open a narrow blood vessel of the heart (coronary artery). Arteries may become blocked by cholesterol buildup (plaques) in the lining of the artery wall. When a coronary artery becomes partially blocked, blood flow to that area decreases. This may lead to chest pain or a heart attack (myocardial infarction). A stent is a small piece of metal that looks like mesh or spring. Stent placement may be done as treatment after a heart attack, or to prevent a heart attack if a blocked artery is found by a coronary angiogram. Let your health care provider know about:  Any allergies you have, including allergies to medicines or contrast dye.  All medicines you are taking, including vitamins, herbs, eye drops, creams, and over-the-counter medicines.  Any problems you or family members have had with anesthetic medicines.  Any blood disorders you have.  Any surgeries you have had.  Any medical conditions you have, including kidney problems or kidney failure.  Whether you are pregnant or may be pregnant.  Whether you are breastfeeding. What are the risks? Generally, this is a safe procedure. However, serious problems may occur, including:  Damage to nearby structures or organs, such as the heart, blood vessels, or kidneys.  A return of blockage.  Bleeding, infection, or bruising at the insertion site.  A collection of blood under the skin (hematoma) at the insertion site.  A blood clot in another part of the body.  Allergic reaction to medicines or dyes.  Bleeding into the abdomen (retroperitoneal  bleeding).  Stroke (rare).  Heart attack (rare). What happens before the procedure? Staying hydrated Follow instructions from your health care provider about hydration, which may include:  Up to 2 hours before the procedure - you may continue to drink clear liquids, such as water, clear fruit juice, black coffee, and plain tea.  Eating and drinking restrictions Follow instructions from your health care provider about eating and drinking, which may include:  8 hours before the procedure - stop eating heavy meals or foods, such as meat, fried foods, or fatty foods.  6 hours before the procedure - stop eating light meals or foods, such as toast or cereal.  2 hours before the procedure - stop drinking clear liquids. Medicines Ask your health care provider about:  Changing or stopping your regular medicines. This is especially important if you are taking diabetes medicines or blood thinners.  Taking medicines such as aspirin and ibuprofen. These medicines can thin your blood. Do not take these medicines unless your health care provider tells you to take them. ? Generally, aspirin is recommended before a thin tube, called a catheter, is passed through a blood vessel and inserted into the heart (  cardiac catheterization).  Taking over-the-counter medicines, vitamins, herbs, and supplements. General instructions  Do not use any products that contain nicotine or tobacco for at least 4 weeks before the procedure. These products include cigarettes, e-cigarettes, and chewing tobacco. If you need help quitting, ask your health care provider.  Plan to have someone take you home from the hospital or clinic.  If you will be going home right after the procedure, plan to have someone with you for 24 hours.  You may have tests and imaging procedures.  Ask your health care provider: ? How your insertion site will be marked. Ask which artery will be used for the procedure. ? What steps will be  taken to help prevent infection. These may include:  Removing hair at the insertion site.  Washing skin with a germ-killing soap.  Taking antibiotic medicine. What happens during the procedure?   An IV will be inserted into one of your veins.  Electrodes may be placed on your chest to monitor your heart rate during the procedure.  You will be given one or more of the following: ? A medicine to help you relax (sedative). ? A medicine to numb the area (local anesthetic) for catheter insertion.  A small incision will be made for catheter insertion.  The catheter will be inserted into an artery using a guide wire. The location may be in your groin, your wrist, or the fold of your arm (near your elbow).  An X-ray procedure (fluoroscopy) will be used to help guide the catheter to the opening of the heart arteries.  A dye will be injected into the catheter. X-rays will be taken. The dye helps to show where any narrowing or blockages are located in the arteries.  Tell your health care provider if you have chest pain or trouble breathing.  A tiny wire will be guided to the blocked spot, and a balloon will be inflated to make the artery wider.  The stent will be expanded to crush the plaques into the wall of the vessel. The stent will hold the area open and improve the blood flow. Most stents have a drug coating to reduce the risk of the stent narrowing over time.  The artery may be made wider using a drill, laser, or other tools that remove plaques.  The catheter will be removed when the blood flow improves. The stent will stay where it was placed, and the lining of the artery will grow over it.  A bandage (dressing) will be placed on the insertion site. Pressure will be applied to stop bleeding.  The IV will be removed. This procedure may vary among health care providers and hospitals. What happens after the procedure?  Your blood pressure, heart rate, breathing rate, and blood  oxygen level will be monitored until you leave the hospital or clinic.  If the procedure is done through the leg, you will lie flat in bed for a few hours or for as long as told by your health care provider. You will be instructed not to bend or cross your legs.  The insertion site and the pulse in your foot or wrist will be checked often.  You may have more blood tests, X-rays, and a test that records the electrical activity of your heart (electrocardiogram, or ECG).  Do not drive for 24 hours if you were given a sedative during your procedure. Summary  Coronary angiogram with stent placement is a procedure to widen or open a narrowed coronary artery. This  is done to treat heart problems.  Before the procedure, let your health care provider know about all the medical conditions and surgeries you have or have had.  This is a safe procedure. However, some problems may occur, including damage to nearby structures or organs, bleeding, blood clots, or allergies.  Follow your health care provider's instructions about eating, drinking, medicines, and other lifestyle changes, such as quitting tobacco use before the procedure. This information is not intended to replace advice given to you by your health care provider. Make sure you discuss any questions you have with your health care provider. Document Revised: 12/08/2018 Document Reviewed: 12/08/2018 Elsevier Patient Education  [place].

## 2019-09-08 NOTE — H&P (View-Only) (Signed)
Follow-up Outpatient Visit Date: 09/08/2019  Primary Care Provider: Burnard Hawthorne, FNP [address] [place] [id number]  Chief Complaint: Shortness of breath, chest pain, and abnormal echocardiogram  HPI:  Michelle [name] is a 79 y.o. female with history of rheumatoid arthritis, mantle cell lymphoma, hyperlipidemia, and GERD, who presents for follow-up of shortness of breath and leg swelling.  I met Michelle [name] in late February, at which time she reported chronic exertional dyspnea and two-pillow orthopnea.  Prior cardiac testing includes left heart cath in 2006 that was without coronary artery disease.  Echo in 12/2018 showed low normal LVEF with grade 1 diastolic dysfunction and mild LVH.  We agreed to repeat an echocardiogram, given worsening or new symptoms.  This revealed reduction in LV systolic function to 63-89% with global hypokinesis and severe hypokinesis of the anterior and anteroseptal wall.  Mild pulmonary hypertension was also noted.  She was started on losartan 12.5 mg daily.  Today, Michelle [name] reports that she feels somewhat better since starting losartan.  Nocturnal chest tightness has resolved.  She also feels like her breathing is somewhat better.  She still has exertional dyspnea when walking from one [place] of her house to the other.  She also reports 2 pillow orthopnea and occasional PND.  She has not had any palpitations, lightheadedness, or falls.  --------------------------------------------------------------------------------------------------  Cardiovascular History & Procedures: Cardiovascular Problems:  LBBB  Cardiomyopathy  Risk Factors:  Hyperlipidemia, age, and obesity  Cath/PCI:  LHC (09/05/2004): No significant coronary artery disease.  LVEF 60%.  CV Surgery:  None  EP Procedures and Devices:  None  Non-Invasive Evaluation(s):  TTE (08/26/2019): Normal LV size and wall thickness with LVEF of 30-35%.  There is global  hypokinesis with severe hypokinesis of the anterior and anteroseptal walls.  Normal RV size and function.  Mild pulmonary hypertension.  Mild mitral regurgitation.  Moderate tricuspid regurgitation.  TTE (12/16/2018): Normal LV size with mild LVH.  LVEF 50-55% with grade 1 diastolic dysfunction.  Normal RV size and function.  Mild left atrial enlargement.  Recent CV Pertinent Labs: Lab Results  Component Value Date   CHOL 142 12/17/2018   HDL 61.40 12/17/2018   LDLCALC 71 12/17/2018   TRIG 49.0 12/17/2018   CHOLHDL 2 12/17/2018   INR 0.97 10/08/2015   K 3.8 07/22/2019   K 3.5 09/14/2013   BUN 11 07/22/2019   BUN 8 09/14/2013   CREATININE 0.66 07/22/2019   CREATININE 0.71 09/14/2013   CREATININE 0.68 04/01/2013    Past medical and surgical history were reviewed and updated in EPIC.  Current Meds  Medication Sig  . acetaminophen (TYLENOL) 500 MG tablet Take 500 mg by mouth every 6 (six) hours as needed.  [name] aspirin EC 81 MG tablet Take 1 tablet (81 mg total) by mouth daily.  . Calcium Carbonate-Vit D-Min (CALTRATE 600+D PLUS) 600-800 MG-UNIT CHEW Chew 1 tablet by mouth 2 (two) times daily.  . cyanocobalamin 500 MCG tablet Take 500 mcg by mouth daily. Take two gummies daily  . diclofenac sodium (VOLTAREN) 1 % GEL Apply 4 g topically 4 (four) times daily.  . fluticasone (FLONASE) 50 MCG/ACT nasal spray SPRAY TWICE IN EACH NOSTRIL ONCE DAILY  . losartan (COZAAR) 25 MG tablet Take 0.5 tablets (12.5 mg total) by mouth daily.  . meclizine (ANTIVERT) 25 MG tablet Take 25 mg by mouth 3 (three) times daily as needed for dizziness.  [name] MEGARED OMEGA-3 KRILL OIL 500 MG CAPS Take 500 mg  by mouth daily.  . meloxicam (MOBIC) 7.5 MG tablet TAKE 1 TABLET BY MOUTH EVERY DAY AS NEEDED FOR PAIN  . Multiple Vitamins-Minerals (MULTIVITAMIN GUMMIES ADULT) CHEW Chew by mouth. Reported on 09/06/2015  . omeprazole (PRILOSEC) 40 MG capsule TAKE 1 CAPSULE BY MOUTH EVERY DAY  . pravastatin (PRAVACHOL) 40 MG  tablet Take 1 tablet (40 mg total) by mouth daily.  . VENTOLIN HFA 108 (90 Base) MCG/ACT inhaler TAKE 2 PUFFS BY MOUTH EVERY 6 HOURS AS NEEDED FOR WHEEZE OR SHORTNESS OF BREATH    Allergies: Patient has no known allergies.  Social History   Tobacco Use  . Smoking status: Never Smoker  . Smokeless tobacco: Never Used  Substance Use Topics  . Alcohol use: No  . Drug use: No    Family History  Problem Relation Age of Onset  . Diabetes Mother   . Cholelithiasis Mother   . Hypertension Sister   . Diabetes Sister   . Heart murmur Sister   . Arthritis Brother   . Breast cancer Neg Hx     Review of Systems: A 12-system review of systems was performed and was negative except as noted in the HPI.  --------------------------------------------------------------------------------------------------  Physical Exam: BP 120/70 (BP Location: Left Arm, Patient Position: Sitting, Cuff Size: Normal)   Pulse 62   Ht '5\' 5"'  (1.651 m)   Wt 248 lb 4 oz (112.6 kg)   SpO2 96%   BMI 41.31 kg/m   General: NAD. Neck: JVP approximately 6 cm without HJR. Lungs: Normal work of breathing.  Coarse breath sounds without wheezes or crackles. Heart: Regular rate and rhythm with 3/6 systolic murmur. Abdomen: Soft, nontender, nondistended. Extremities: Trace right and 1+ left ankle edema.  EKG: Normal sinus rhythm with left bundle branch block.  Compared with prior tracing from 07/25/2019, axis has shifted to the left.  Otherwise, there has been no significant interval change.  Lab Results  Component Value Date   WBC 5.7 07/22/2019   HGB 12.6 07/22/2019   HCT 40.3 07/22/2019   MCV 92.4 07/22/2019   PLT 186 07/22/2019    Lab Results  Component Value Date   NA 142 07/22/2019   K 3.8 07/22/2019   CL 106 07/22/2019   CO2 28 07/22/2019   BUN 11 07/22/2019   CREATININE 0.66 07/22/2019   GLUCOSE 108 (H) 07/22/2019   ALT 12 07/22/2019    Lab Results  Component Value Date   CHOL 142 12/17/2018     HDL 61.40 12/17/2018   LDLCALC 71 12/17/2018   TRIG 49.0 12/17/2018   CHOLHDL 2 12/17/2018    --------------------------------------------------------------------------------------------------  ASSESSMENT AND PLAN: Chronic systolic heart failure: Michelle [name] appears mildly volume overloaded with NYHA class III heart failure symptoms.  She is tolerating low-dose losartan well.  Resting heart rate in the low 60s precludes addition of a beta-blocker.  We will continue current dose of losartan and start furosemide 20 mg daily.  I will check a BMP today.  We have also discussed importance of ischemia evaluation and will proceed with right and left heart catheterization at the patient's convenience in the next few weeks.  I have reviewed the risks, indications, and alternatives to cardiac catheterization, possible angioplasty, and stenting with the patient. Risks include but are not limited to bleeding, infection, vascular injury, stroke, myocardial infection, arrhythmia, kidney injury, radiation-related injury in the case of prolonged fluoroscopy use, emergency cardiac surgery, and death. The patient understands the risks of serious complication is 1-2 in 1610  with diagnostic cardiac cath and 1-2% or less with angioplasty/stenting.  Follow-up: Return to clinic 2 weeks after catheterization.  Nelva Bush, MD 09/08/2019 1:23 PM

## 2019-09-09 ENCOUNTER — Telehealth: Payer: Self-pay | Admitting: *Deleted

## 2019-09-09 LAB — CBC WITH DIFFERENTIAL/PLATELET
Basophils Absolute: 0 10*3/uL (ref 0.0–0.2)
Basos: 0 %
EOS (ABSOLUTE): 0.1 10*3/uL (ref 0.0–0.4)
Eos: 2 %
Hematocrit: 40.9 % (ref 34.0–46.6)
Hemoglobin: 13.4 g/dL (ref 11.1–15.9)
Immature Grans (Abs): 0 10*3/uL (ref 0.0–0.1)
Immature Granulocytes: 0 %
Lymphocytes Absolute: 1.3 10*3/uL (ref 0.7–3.1)
Lymphs: 23 %
MCH: 29.4 pg (ref 26.6–33.0)
MCHC: 32.8 g/dL (ref 31.5–35.7)
MCV: 90 fL (ref 79–97)
Monocytes Absolute: 0.4 10*3/uL (ref 0.1–0.9)
Monocytes: 6 %
Neutrophils Absolute: 3.8 10*3/uL (ref 1.4–7.0)
Neutrophils: 69 %
Platelets: 194 10*3/uL (ref 150–450)
RBC: 4.56 x10E6/uL (ref 3.77–5.28)
RDW: 13.9 % (ref 11.7–15.4)
WBC: 5.5 10*3/uL (ref 3.4–10.8)

## 2019-09-09 LAB — BASIC METABOLIC PANEL
BUN/Creatinine Ratio: 20 (ref 12–28)
BUN: 15 mg/dL (ref 8–27)
CO2: 24 mmol/L (ref 20–29)
Calcium: 9.1 mg/dL (ref 8.7–10.3)
Chloride: 107 mmol/L — ABNORMAL HIGH (ref 96–106)
Creatinine, Ser: 0.74 mg/dL (ref 0.57–1.00)
GFR calc Af Amer: 91 mL/min/{1.73_m2} (ref >59)
GFR calc non Af Amer: 79 mL/min/{1.73_m2} (ref >59)
Glucose: 85 mg/dL (ref 65–99)
Potassium: 4 mmol/L (ref 3.5–5.2)
Sodium: 145 mmol/L — ABNORMAL HIGH (ref 134–144)

## 2019-09-09 LAB — PROTIME-INR
INR: 1 (ref 0.9–1.2)
Prothrombin Time: 11 s (ref 9.1–12.0)

## 2019-09-09 NOTE — Telephone Encounter (Signed)
Spoke with patient to find out what day she would like to have her R/L heart cath.  She discussed with her family members and April the 20th is the preferred date.  Scheduled her for 09/20/19 at 7:30 am with arrival time of 6:30 am. She is aware to get her pre-procedural Covid test on Friday, 09/16/19 between 8 am and 1 pm.  We reviewed her AVS instructions from yesterday with the remaining preprocedural instructions. She will let us know if any further questions or concerns arise.   Message sent to precert.

## 2019-09-16 ENCOUNTER — Other Ambulatory Visit
Admission: RE | Admit: 2019-09-16 | Discharge: 2019-09-16 | Disposition: A | Discharge status: Home / Self Care | Payer: Medicare HMO | Source: Ambulatory Visit | Attending: Internal Medicine | Admitting: Internal Medicine

## 2019-09-16 ENCOUNTER — Other Ambulatory Visit: Payer: Self-pay

## 2019-09-16 DIAGNOSIS — Z01812 Encounter for preprocedural laboratory examination: Secondary | ICD-10-CM | POA: Diagnosis not present

## 2019-09-16 DIAGNOSIS — Z20822 Contact with and (suspected) exposure to covid-19: Secondary | ICD-10-CM | POA: Insufficient documentation

## 2019-09-16 LAB — SARS CORONAVIRUS 2 (TAT 6-24 HRS): SARS Coronavirus 2: NEGATIVE

## 2019-09-20 ENCOUNTER — Other Ambulatory Visit: Payer: Self-pay

## 2019-09-20 ENCOUNTER — Encounter
Admission: RE | Disposition: A | Discharge status: Home / Self Care | Payer: Self-pay | Source: Home / Self Care | Attending: Internal Medicine

## 2019-09-20 ENCOUNTER — Ambulatory Visit
Admission: RE | Admit: 2019-09-20 | Discharge: 2019-09-20 | Disposition: A | Discharge status: Home / Self Care | Payer: Medicare HMO | Attending: Internal Medicine | Admitting: Internal Medicine

## 2019-09-20 ENCOUNTER — Encounter: Payer: Self-pay | Admitting: Internal Medicine

## 2019-09-20 DIAGNOSIS — I447 Left bundle-branch block, unspecified: Secondary | ICD-10-CM | POA: Diagnosis not present

## 2019-09-20 DIAGNOSIS — Z79899 Other long term (current) drug therapy: Secondary | ICD-10-CM | POA: Insufficient documentation

## 2019-09-20 DIAGNOSIS — E785 Hyperlipidemia, unspecified: Secondary | ICD-10-CM | POA: Diagnosis not present

## 2019-09-20 DIAGNOSIS — E669 Obesity, unspecified: Secondary | ICD-10-CM | POA: Diagnosis not present

## 2019-09-20 DIAGNOSIS — Z8572 Personal history of non-Hodgkin lymphomas: Secondary | ICD-10-CM | POA: Diagnosis not present

## 2019-09-20 DIAGNOSIS — I428 Other cardiomyopathies: Secondary | ICD-10-CM | POA: Diagnosis not present

## 2019-09-20 DIAGNOSIS — I5022 Chronic systolic (congestive) heart failure: Secondary | ICD-10-CM | POA: Insufficient documentation

## 2019-09-20 DIAGNOSIS — Z7982 Long term (current) use of aspirin: Secondary | ICD-10-CM | POA: Diagnosis not present

## 2019-09-20 DIAGNOSIS — Z6841 Body Mass Index (BMI) 40.0 and over, adult: Secondary | ICD-10-CM | POA: Insufficient documentation

## 2019-09-20 DIAGNOSIS — Z791 Long term (current) use of non-steroidal anti-inflammatories (NSAID): Secondary | ICD-10-CM | POA: Insufficient documentation

## 2019-09-20 DIAGNOSIS — K219 Gastro-esophageal reflux disease without esophagitis: Secondary | ICD-10-CM | POA: Insufficient documentation

## 2019-09-20 DIAGNOSIS — I272 Pulmonary hypertension, unspecified: Secondary | ICD-10-CM | POA: Diagnosis not present

## 2019-09-20 DIAGNOSIS — I429 Cardiomyopathy, unspecified: Secondary | ICD-10-CM | POA: Diagnosis not present

## 2019-09-20 DIAGNOSIS — M069 Rheumatoid arthritis, unspecified: Secondary | ICD-10-CM | POA: Diagnosis not present

## 2019-09-20 HISTORY — PX: RIGHT/LEFT HEART CATH AND CORONARY ANGIOGRAPHY: CATH118266

## 2019-09-20 SURGERY — RIGHT/LEFT HEART CATH AND CORONARY ANGIOGRAPHY
Anesthesia: Moderate Sedation | Laterality: Bilateral

## 2019-09-20 MED ORDER — VERAPAMIL HCL 2.5 MG/ML IV SOLN
INTRAVENOUS | Status: AC
  Filled 2019-09-20: qty 2

## 2019-09-20 MED ORDER — SODIUM CHLORIDE 0.9 % IV SOLN: INTRAVENOUS | Status: DC

## 2019-09-20 MED ORDER — MIDAZOLAM HCL 2 MG/2ML IJ SOLN
INTRAMUSCULAR | Status: DC | PRN
  Administered 2019-09-20: 1 mg via INTRAVENOUS

## 2019-09-20 MED ORDER — SODIUM CHLORIDE 0.9% FLUSH: 3.0000 mL | Freq: Two times a day (BID) | INTRAVENOUS | Status: DC

## 2019-09-20 MED ORDER — VERAPAMIL HCL 2.5 MG/ML IV SOLN
INTRAVENOUS | Status: DC | PRN
  Administered 2019-09-20: 2.5 mg via INTRA_ARTERIAL

## 2019-09-20 MED ORDER — ONDANSETRON HCL 4 MG/2ML IJ SOLN: 4.0000 mg | Freq: Four times a day (QID) | INTRAMUSCULAR | Status: DC | PRN

## 2019-09-20 MED ORDER — IOHEXOL 300 MG/ML  SOLN
INTRAMUSCULAR | Status: DC | PRN
  Administered 2019-09-20: 50 mL

## 2019-09-20 MED ORDER — ASPIRIN 81 MG PO CHEW: 81.0000 mg | CHEWABLE_TABLET | ORAL | Status: DC

## 2019-09-20 MED ORDER — FENTANYL CITRATE (PF) 100 MCG/2ML IJ SOLN
INTRAMUSCULAR | Status: AC
  Filled 2019-09-20: qty 2

## 2019-09-20 MED ORDER — LABETALOL HCL 5 MG/ML IV SOLN: 10.0000 mg | INTRAVENOUS | Status: DC | PRN

## 2019-09-20 MED ORDER — LOSARTAN POTASSIUM 25 MG PO TABS: 25.0000 mg | ORAL_TABLET | Freq: Every day | ORAL | 2 refills | Status: DC

## 2019-09-20 MED ORDER — ACETAMINOPHEN 325 MG PO TABS: 650.0000 mg | ORAL_TABLET | ORAL | Status: DC | PRN

## 2019-09-20 MED ORDER — HEPARIN (PORCINE) IN NACL 1000-0.9 UT/500ML-% IV SOLN
INTRAVENOUS | Status: AC
  Filled 2019-09-20: qty 1000

## 2019-09-20 MED ORDER — HEPARIN SODIUM (PORCINE) 1000 UNIT/ML IJ SOLN
INTRAMUSCULAR | Status: AC
  Filled 2019-09-20: qty 1

## 2019-09-20 MED ORDER — HEPARIN SODIUM (PORCINE) 1000 UNIT/ML IJ SOLN
INTRAMUSCULAR | Status: DC | PRN
  Administered 2019-09-20: 5000 [IU] via INTRAVENOUS

## 2019-09-20 MED ORDER — SODIUM CHLORIDE 0.9% FLUSH: 3.0000 mL | INTRAVENOUS | Status: DC | PRN

## 2019-09-20 MED ORDER — MIDAZOLAM HCL 2 MG/2ML IJ SOLN
INTRAMUSCULAR | Status: AC
  Filled 2019-09-20: qty 2

## 2019-09-20 MED ORDER — HEPARIN (PORCINE) IN NACL 1000-0.9 UT/500ML-% IV SOLN
INTRAVENOUS | Status: DC | PRN
  Administered 2019-09-20: 500 mL

## 2019-09-20 MED ORDER — FENTANYL CITRATE (PF) 100 MCG/2ML IJ SOLN
INTRAMUSCULAR | Status: DC | PRN
  Administered 2019-09-20 (×2): 25 ug via INTRAVENOUS

## 2019-09-20 MED ORDER — SODIUM CHLORIDE 0.9 % IV SOLN: 250.0000 mL | INTRAVENOUS | Status: DC | PRN

## 2019-09-20 MED ORDER — HYDRALAZINE HCL 20 MG/ML IJ SOLN: 10.0000 mg | INTRAMUSCULAR | Status: DC | PRN

## 2019-09-20 SURGICAL SUPPLY — 14 items
CATH 5F 110X4 TIG (CATHETERS) ×2 IMPLANT
CATH BALLN WEDGE 5F 110CM (CATHETERS) ×2 IMPLANT
DEVICE RAD TR BAND REGULAR (VASCULAR PRODUCTS) ×2 IMPLANT
GLIDESHEATH SLEND SS 6F .021 (SHEATH) ×2 IMPLANT
GUIDEWIRE EMER 3M J .025X150CM (WIRE) ×2 IMPLANT
GUIDEWIRE INQWIRE 1.5J.035X260 (WIRE) ×1 IMPLANT
INQWIRE 1.5J .035X260CM (WIRE) ×2
KIT MANI 3VAL PERCEP (MISCELLANEOUS) ×2 IMPLANT
KIT RIGHT HEART (MISCELLANEOUS) ×2 IMPLANT
PACK CARDIAC CATH (CUSTOM PROCEDURE TRAY) ×2 IMPLANT
PAD ELECT DEFIB RADIOL ZOLL (MISCELLANEOUS) ×2 IMPLANT
PANNUS RETENTION SYSTEM 2 PAD (MISCELLANEOUS) ×2 IMPLANT
SHEATH GLIDE SLENDER 4/5FR (SHEATH) ×2 IMPLANT
WIRE HITORQ VERSACORE ST 145CM (WIRE) ×2 IMPLANT

## 2019-09-20 NOTE — Brief Op Note (Signed)
BRIEF CARDIAC CATHETERIZATION NOTE  DATE: 09/20/2019  TIME: 8:47 AM  PATIENT:  Michelle [name]  79 y.o. female  PRE-OPERATIVE DIAGNOSIS:  Cardiomyopathy  POST-OPERATIVE DIAGNOSIS:  NICM  PROCEDURE:  Procedure(s): RIGHT/LEFT HEART CATH AND CORONARY ANGIOGRAPHY (Bilateral)  SURGEON:  Surgeon(s) and Role:    * Ciarrah Rae, MD - Primary  FINDINGS: 1. No significant CAD. 2. Normal left heart filling pressure. 3. Upper normal right heart and pulmonary artery pressures. 4. Normal Fick cardiac output/index.  RECOMMENDATIONS: 1. Optimize GDMT of NICM.  Nelva Bush, MD [facility]

## 2019-09-20 NOTE — Interval H&P Note (Signed)
History and Physical Interval Note:  09/20/2019 7:51 AM  Michelle [name]  has presented today for surgery, with the diagnosis of cardiomyopathy.  The various methods of treatment have been discussed with the patient and family. After consideration of risks, benefits and other options for treatment, the patient has consented to  Procedure(s): RIGHT/LEFT HEART CATH AND CORONARY ANGIOGRAPHY (Bilateral) as a surgical intervention.  The patient's history has been reviewed, patient examined, no change in status, stable for surgery.  I have reviewed the patient's chart and labs.  Questions were answered to the patient's satisfaction.    Cath Lab Visit (complete for each Cath Lab visit)  Clinical Evaluation Leading to the Procedure:   ACS: No.  Non-ACS:    Anginal Classification: NYHA II  Anti-ischemic medical therapy: No Therapy  Non-Invasive Test Results: No non-invasive testing performed (LVEF 30-35% by echo -> high risk)  Prior CABG: No previous CABG  Michelle [name]

## 2019-09-21 ENCOUNTER — Encounter: Payer: Self-pay | Admitting: Cardiology

## 2019-10-06 ENCOUNTER — Ambulatory Visit (INDEPENDENT_AMBULATORY_CARE_PROVIDER_SITE_OTHER): Payer: Medicare HMO | Admitting: Internal Medicine

## 2019-10-06 ENCOUNTER — Encounter: Payer: Self-pay | Admitting: Internal Medicine

## 2019-10-06 ENCOUNTER — Other Ambulatory Visit: Payer: Self-pay

## 2019-10-06 VITALS — BP 120/82 | HR 60 | Ht 65.0 in | Wt 247.0 lb

## 2019-10-06 DIAGNOSIS — I5022 Chronic systolic (congestive) heart failure: Secondary | ICD-10-CM | POA: Diagnosis not present

## 2019-10-06 MED ORDER — FUROSEMIDE 40 MG PO TABS: 40.0000 mg | ORAL_TABLET | Freq: Every day | ORAL | 1 refills | Status: DC

## 2019-10-06 MED ORDER — LOSARTAN POTASSIUM 50 MG PO TABS: 50.0000 mg | ORAL_TABLET | Freq: Every day | ORAL | 1 refills | Status: DC

## 2019-10-06 NOTE — Progress Notes (Signed)
Follow-up Outpatient Visit Date: 10/06/2019  Primary Care Provider: Burnard Hawthorne, FNP [address] [place] [id number]  Chief Complaint: Follow-up heart failure  HPI:  Michelle [name] is a 79 y.o. 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  I last saw Michelle [name] on 09/08/2018 for follow-up of her shortness of breath and abnormal echocardiogram demonstrating an LVEF of 30-35% with global hypokinesis.  She noted feeling somewhat better after having been started on a low-dose losartan.  She underwent right and left heart catheterization on 09/20/2019, which showed no significant CAD.  Filling pressures were upper normal to mildly elevated.  Today, Michelle [name] reports that her breathing has continued to improve, though she still has exertional dyspnea when walking extended distances, as well as 2 pillow orthopnea.  She denies chest pain, palpitations, lightheadedness, and edema.  She is tolerating her medications well.  --------------------------------------------------------------------------------------------------  Cardiovascular History & Procedures: Cardiovascular Problems:  LBBB  Cardiomyopathy  Risk Factors:  Hyperlipidemia, age, and obesity  Cath/PCI:  R/LHC (09/20/2019): No angiographically significant CAD.  Normal left heart filling pressures.  Upper normal to mildly elevated right heart and pulmonary artery pressures.  Normal Fick cardiac output/index.  LHC (09/05/2004): No significant coronary artery disease. LVEF 60%.  CV Surgery:  None  EP Procedures and Devices:  None  Non-Invasive Evaluation(s):  TTE (08/26/2019): Normal LV size and wall thickness with LVEF of 30-35%.  There is global hypokinesis with severe hypokinesis of the anterior and anteroseptal walls.  Normal RV size  and function.  Mild pulmonary hypertension.  Mild mitral regurgitation.  Moderate tricuspid regurgitation.  TTE (12/16/2018): Normal LV size with mild LVH. LVEF 50-55% with grade 1 diastolic dysfunction. Normal RV size and function. Mild left atrial enlargement.  Recent CV Pertinent Labs: Lab Results  Component Value Date   CHOL 142 12/17/2018   HDL 61.40 12/17/2018   LDLCALC 71 12/17/2018   TRIG 49.0 12/17/2018   CHOLHDL 2 12/17/2018   INR 1.0 09/08/2019   K 4.0 09/08/2019   K 3.5 09/14/2013   BUN 15 09/08/2019   BUN 8 09/14/2013   CREATININE 0.74 09/08/2019   CREATININE 0.71 09/14/2013   CREATININE 0.68 04/01/2013    Past medical and surgical history were reviewed and updated in EPIC.  Current Meds  Medication Sig  . acetaminophen (TYLENOL) 500 MG tablet Take 1,000 mg by mouth every 6 (six) hours as needed for mild pain.   . Ascorbic Acid (VITAMIN C) 1000 MG tablet Take 1,000 mg by mouth daily.  . Calcium Carbonate-Vit D-Min (CALTRATE 600+D PLUS) 600-800 MG-UNIT CHEW Chew 1 tablet by mouth 2 (two) times daily. (Patient taking differently: Chew 1 tablet by mouth daily. )  . Cholecalciferol (VITAMIN D3) 50 MCG (2000 UT) capsule Take 2,000 Units by mouth daily.  . cyanocobalamin 2000 MCG tablet Take 2,500 mcg by mouth daily.   . fluticasone (FLONASE) 50 MCG/ACT nasal spray Place 2 sprays into both nostrils daily as needed for allergies.   . furosemide (LASIX) 20 MG tablet Take 1 tablet (20 mg total) by mouth daily.  [name] losartan (COZAAR) 25 MG tablet Take 1 tablet (25 mg total) by mouth daily.  . meclizine (ANTIVERT) 25 MG tablet Take 25 mg by mouth 3 (three) times daily as needed for dizziness.  [name] MEGARED OMEGA-3 KRILL OIL PO Take 1 tablet by mouth daily. 4  in 1  . meloxicam (MOBIC) 7.5 MG tablet TAKE 1 TABLET BY MOUTH EVERY DAY AS NEEDED FOR PAIN (Patient taking differently: Take 7.5 mg by mouth daily. )  . Multiple Vitamins-Minerals (MULTIVITAMIN GUMMIES ADULT) CHEW Chew 1  tablet by mouth daily. Vitafurion  . omeprazole (PRILOSEC) 40 MG capsule TAKE 1 CAPSULE BY MOUTH EVERY DAY (Patient taking differently: Take 40 mg by mouth daily. )  . Polyethyl Glycol-Propyl Glycol (SYSTANE) 0.4-0.3 % SOLN Place 1 drop into both eyes daily as needed (Dry eye).  . pravastatin (PRAVACHOL) 40 MG tablet Take 1 tablet (40 mg total) by mouth daily. (Patient taking differently: Take 40 mg by mouth at bedtime. )  . VENTOLIN HFA 108 (90 Base) MCG/ACT inhaler TAKE 2 PUFFS BY MOUTH EVERY 6 HOURS AS NEEDED FOR WHEEZE OR SHORTNESS OF BREATH    Allergies: Patient has no known allergies.  Social History   Tobacco Use  . Smoking status: Never Smoker  . Smokeless tobacco: Never Used  Substance Use Topics  . Alcohol use: No  . Drug use: No    Family History  Problem Relation Age of Onset  . Diabetes Mother   . Cholelithiasis Mother   . Hypertension Sister   . Diabetes Sister   . Heart murmur Sister   . Arthritis Brother   . Breast cancer Neg Hx     Review of Systems: A 12-system review of systems was performed and was negative except as noted in the HPI.  --------------------------------------------------------------------------------------------------  Physical Exam: BP 120/82 (BP Location: Left Arm, Patient Position: Sitting, Cuff Size: Normal)   Pulse 60   Ht 5\' 5"  (1.651 m)   Wt 247 lb (112 kg)   SpO2 97%   BMI 41.10 kg/m   General:  NAD. HEENT: No conjunctival pallor or scleral icterus. Facemask in place. Neck: JVP ~6-8 cm w/o HJR. Lungs: CTA bilaterally. Heart: RRR with 2/6 systolic murmur. Abd: Soft, NT/ND. Ext: Right radial/brachial cath sites well-healed.  Trace pretibial edema bilaterally.  EKG:  NSR with left axis deviation and LBBB.  No significant change since 09/08/2019.  Lab Results  Component Value Date   WBC 5.5 09/08/2019   HGB 13.4 09/08/2019   HCT 40.9 09/08/2019   MCV 90 09/08/2019   PLT 194 09/08/2019    Lab Results  Component Value  Date   NA 145 (H) 09/08/2019   K 4.0 09/08/2019   CL 107 (H) 09/08/2019   CO2 24 09/08/2019   BUN 15 09/08/2019   CREATININE 0.74 09/08/2019   GLUCOSE 85 09/08/2019   ALT 12 07/22/2019    Lab Results  Component Value Date   CHOL 142 12/17/2018   HDL 61.40 12/17/2018   LDLCALC 71 12/17/2018   TRIG 49.0 12/17/2018   CHOLHDL 2 12/17/2018    --------------------------------------------------------------------------------------------------  ASSESSMENT AND PLAN: Chronic HFrEF due to NICM: Michelle. Broadbent reports continued gradual improvement in her exertional dyspnea, though she still endorses NYHA class II-III heart failure symptoms.  She also has evidence of mild fluid retention on examination today.  Her weight is minimally changed from our last visit a month ago.  I have recommended that we increase furosemide to 40 mg daily and losartan to 50 mg daily.  Michelle. Dethloff is scheduled for a CMP in two weeks through the oncology clinic.  I am reluctant to add a beta-blocker given a resting heart rate of 60 bpm.  Follow-up: Return to clinic in 6 weeks.  Nelva Bush, MD 10/06/2019 2:23 PM

## 2019-10-06 NOTE — Patient Instructions (Signed)
Medication Instructions:  Your physician has recommended you make the following change in your medication:  1- INCREASE Losartan to 50 mg by mouth once a day. 2- INCREASE Furosemide to 40 mg by mouth once a day.  *If you need a refill on your cardiac medications before your next appointment, please call your pharmacy*  Lab Work: none If you have labs (blood work) drawn today and your tests are completely normal, you will receive your results only by: [name] MyChart Message (if you have MyChart) OR . A paper copy in the mail If you have any lab test that is abnormal or we need to change your treatment, we will call you to review the results.  Testing/Procedures: none  Follow-Up: At [facility], you and your health needs are our priority.  As part of our continuing mission to provide you with exceptional heart care, we have created designated Provider Care Teams.  These Care Teams include your primary Cardiologist (physician) and Advanced Practice Providers (APPs -  Physician Assistants and Nurse Practitioners) who all work together to provide you with the care you need, when you need it.  We recommend signing up for the patient portal called "MyChart".  Sign up information is provided on this After Visit Summary.  MyChart is used to connect with patients for Virtual Visits (Telemedicine).  Patients are able to view lab/test results, encounter notes, upcoming appointments, etc.  Non-urgent messages can be sent to your provider as well.   To learn more about what you can do with MyChart, go to [email or web address].    Your next appointment:   6 week(s)  The format for your next appointment:   In Person  Provider:    You may see DR Harrell Gave END or one of the following Advanced Practice Providers on your designated Care Team:    Murray Hodgkins, NP  Christell Faith, PA-C  Marrianne Mood, PA-C

## 2019-10-20 ENCOUNTER — Other Ambulatory Visit: Payer: Self-pay

## 2019-10-20 ENCOUNTER — Ambulatory Visit
Admission: RE | Admit: 2019-10-20 | Discharge: 2019-10-20 | Disposition: A | Discharge status: Home / Self Care | Payer: Medicare HMO | Source: Ambulatory Visit | Attending: Internal Medicine | Admitting: Internal Medicine

## 2019-10-20 ENCOUNTER — Other Ambulatory Visit: Payer: Self-pay | Admitting: *Deleted

## 2019-10-20 ENCOUNTER — Inpatient Hospital Stay: Payer: Medicare HMO | Attending: Internal Medicine

## 2019-10-20 DIAGNOSIS — J8410 Pulmonary fibrosis, unspecified: Secondary | ICD-10-CM | POA: Diagnosis not present

## 2019-10-20 DIAGNOSIS — I517 Cardiomegaly: Secondary | ICD-10-CM | POA: Diagnosis not present

## 2019-10-20 DIAGNOSIS — C8311 Mantle cell lymphoma, lymph nodes of head, face, and neck: Secondary | ICD-10-CM

## 2019-10-20 DIAGNOSIS — J479 Bronchiectasis, uncomplicated: Secondary | ICD-10-CM | POA: Insufficient documentation

## 2019-10-20 DIAGNOSIS — C8310 Mantle cell lymphoma, unspecified site: Secondary | ICD-10-CM | POA: Diagnosis not present

## 2019-10-20 DIAGNOSIS — I700 Atherosclerosis of aorta: Secondary | ICD-10-CM | POA: Diagnosis not present

## 2019-10-20 DIAGNOSIS — I272 Pulmonary hypertension, unspecified: Secondary | ICD-10-CM | POA: Insufficient documentation

## 2019-10-20 LAB — CBC WITH DIFFERENTIAL/PLATELET
Abs Immature Granulocytes: 0.02 10*3/uL (ref 0.00–0.07)
Basophils Absolute: 0 10*3/uL (ref 0.0–0.1)
Basophils Relative: 0 %
Eosinophils Absolute: 0.1 10*3/uL (ref 0.0–0.5)
Eosinophils Relative: 2 %
HCT: 42.2 % (ref 36.0–46.0)
Hemoglobin: 13.8 g/dL (ref 12.0–15.0)
Immature Granulocytes: 0 %
Lymphocytes Relative: 24 %
Lymphs Abs: 1.6 10*3/uL (ref 0.7–4.0)
MCH: 29.4 pg (ref 26.0–34.0)
MCHC: 32.7 g/dL (ref 30.0–36.0)
MCV: 89.8 fL (ref 80.0–100.0)
Monocytes Absolute: 0.4 10*3/uL (ref 0.1–1.0)
Monocytes Relative: 5 %
Neutro Abs: 4.6 10*3/uL (ref 1.7–7.7)
Neutrophils Relative %: 69 %
Platelets: 187 10*3/uL (ref 150–400)
RBC: 4.7 MIL/uL (ref 3.87–5.11)
RDW: 14.4 % (ref 11.5–15.5)
WBC: 6.7 10*3/uL (ref 4.0–10.5)
nRBC: 0 % (ref 0.0–0.2)

## 2019-10-20 LAB — COMPREHENSIVE METABOLIC PANEL
ALT: 14 U/L (ref 0–44)
AST: 23 U/L (ref 15–41)
Albumin: 4.1 g/dL (ref 3.5–5.0)
Alkaline Phosphatase: 75 U/L (ref 38–126)
Anion gap: 8 (ref 5–15)
BUN: 16 mg/dL (ref 8–23)
CO2: 30 mmol/L (ref 22–32)
Calcium: 8.9 mg/dL (ref 8.9–10.3)
Chloride: 102 mmol/L (ref 98–111)
Creatinine, Ser: 0.66 mg/dL (ref 0.44–1.00)
GFR calc Af Amer: >60 mL/min (ref >60)
GFR calc non Af Amer: >60 mL/min (ref >60)
Glucose, Bld: 104 mg/dL — ABNORMAL HIGH (ref 70–99)
Potassium: 3.5 mmol/L (ref 3.5–5.1)
Sodium: 140 mmol/L (ref 135–145)
Total Bilirubin: 0.8 mg/dL (ref 0.3–1.2)
Total Protein: 7.1 g/dL (ref 6.5–8.1)

## 2019-10-20 LAB — LACTATE DEHYDROGENASE: LDH: 179 U/L (ref 98–192)

## 2019-10-20 MED ORDER — IOHEXOL 300 MG/ML  SOLN
100.0000 mL | Freq: Once | INTRAMUSCULAR | Status: AC | PRN
  Administered 2019-10-20: 100 mL via INTRAVENOUS

## 2019-10-21 ENCOUNTER — Inpatient Hospital Stay: Payer: Medicare HMO | Admitting: Internal Medicine

## 2019-10-21 ENCOUNTER — Other Ambulatory Visit: Payer: Medicare HMO

## 2019-10-21 DIAGNOSIS — C8311 Mantle cell lymphoma, lymph nodes of head, face, and neck: Secondary | ICD-10-CM | POA: Diagnosis not present

## 2019-10-21 NOTE — Progress Notes (Signed)
[facility] OFFICE PROGRESS NOTE  Patient Care Team: Burnard Hawthorne, FNP as PCP - General (Family Medicine) Jackolyn Confer, MD (Internal Medicine)  Cancer Staging No matching staging information was found for the patient.   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 Feb 9th- START Benda-Ritux with neulasta; Prolonged neutropenia; DISCONT- Benda-Ritux;   # April 13 th 2017- START R-CHOP x1; severe/prolonged neutropenia; PET- CR; BMBx-Neg; Disc R-CHOP  # 26th MAY 2017- Start Rituxan q 37M Main OCT 12th 2017- PET NED.  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  female pleasant patient above history of mantle cell lymphoma currently on surveillance is here for follow-up/is here to review the results of the CT scan.  Patient denies any weight loss but denies any nausea vomiting.  Denies any headaches.  No chest pain.  Denies any lumps or bumps.  No night sweats.  No fevers.  Patient states her husband lymphoma has returned; and has currently restarted back on chemotherapy at [facility].  Review of Systems  Constitutional: Negative for chills, diaphoresis, fever, malaise/fatigue and weight loss.  HENT: Negative for nosebleeds and sore throat.   Eyes: Negative for double vision.  Respiratory: Negative for hemoptysis, sputum production and wheezing.   Cardiovascular: Negative for chest pain, palpitations, orthopnea and leg  swelling.  Gastrointestinal: Negative for abdominal pain, blood in stool, constipation, diarrhea, heartburn, melena, nausea and vomiting.  Genitourinary: Negative for dysuria, frequency and urgency.  Musculoskeletal: Negative for back pain and joint pain.  Skin: Negative.  Negative for itching and rash.  Neurological: Negative for dizziness, tingling, focal weakness, weakness and headaches.  Endo/Heme/Allergies: Does not bruise/bleed easily.  Psychiatric/Behavioral: Negative for depression. The patient is not nervous/anxious and does not have insomnia.     PAST MEDICAL HISTORY :  Past Medical History:  Diagnosis Date  . Arthritis   . Collagen vascular disease ([place])   . GERD (gastroesophageal reflux disease)   . Headache(784.0)   . History of methotrexate therapy   . Hyperlipidemia    hx  . Lymphadenopathy of head and neck 01/2015   see on Thyroid ultrasound  . Lymphoma, mantle cell ([place]) 06/01/2015   bx of lymph node in right breast/Stage IV Mantle Cell Lymphoma  . Personal history of chemotherapy   . Rheumatoid arthritis ([place])     PAST SURGICAL HISTORY :   Past Surgical History:  Procedure Laterality Date  . CARDIAC CATHETERIZATION  09/2004   [facility]; EF 60%  . CARDIAC CATHETERIZATION  08/2004   [facility]  . IR FLUORO GUIDED NEEDLE PLC ASPIRATION/INJECTION LOC  06/18/2018  . PERIPHERAL VASCULAR CATHETERIZATION N/A 07/04/2015   Procedure: [name] Insertion;  Surgeon: [name], MD;  Location: [facility] CV LAB;  Service: Cardiovascular;  Laterality: N/A;  . PORTA CATH REMOVAL N/A 06/23/2018   Procedure: PORTA CATH REMOVAL;  Surgeon: [name], MD;  Location: [facility] CV LAB;  Service: Cardiovascular;  Laterality:  N/A;  . RIGHT/LEFT HEART CATH AND CORONARY ANGIOGRAPHY Bilateral 09/20/2019   Procedure: RIGHT/LEFT HEART CATH AND CORONARY ANGIOGRAPHY;  Surgeon: Nelva Bush, MD;  Location: [facility] CV LAB;  Service: Cardiovascular;  Laterality: Bilateral;    FAMILY  HISTORY :   Family History  Problem Relation Age of Onset  . Diabetes Mother   . Cholelithiasis Mother   . Hypertension Sister   . Diabetes Sister   . Heart murmur Sister   . Arthritis Brother   . Breast cancer Neg Hx     SOCIAL HISTORY:   Social History   Tobacco Use  . Smoking status: Never Smoker  . Smokeless tobacco: Never Used  Substance Use Topics  . Alcohol use: No  . Drug use: No    ALLERGIES:  has No Known Allergies.  MEDICATIONS:  Current Outpatient Medications  Medication Sig Dispense Refill  . acetaminophen (TYLENOL) 500 MG tablet Take 1,000 mg by mouth every 6 (six) hours as needed for mild pain.     . Ascorbic Acid (VITAMIN C) 1000 MG tablet Take 1,000 mg by mouth daily.    . Calcium Carbonate-Vit D-Min (CALTRATE 600+D PLUS) 600-800 MG-UNIT CHEW Chew 1 tablet by mouth 2 (two) times daily. (Patient taking differently: Chew 1 tablet by mouth daily. ) 60 tablet 6  . Cholecalciferol (VITAMIN D3) 50 MCG (2000 UT) capsule Take 2,000 Units by mouth daily.    . cyanocobalamin 2000 MCG tablet Take 2,500 mcg by mouth daily.     . fluticasone (FLONASE) 50 MCG/ACT nasal spray Place 2 sprays into both nostrils daily as needed for allergies.   4  . furosemide (LASIX) 40 MG tablet Take 1 tablet (40 mg total) by mouth daily. 90 tablet 1  . losartan (COZAAR) 50 MG tablet Take 1 tablet (50 mg total) by mouth daily. 90 tablet 1  . meclizine (ANTIVERT) 25 MG tablet Take 25 mg by mouth 3 (three) times daily as needed for dizziness.    [name] MEGARED OMEGA-3 KRILL OIL PO Take 1 tablet by mouth daily. 4 in 1    . meloxicam (MOBIC) 7.5 MG tablet TAKE 1 TABLET BY MOUTH EVERY DAY AS NEEDED FOR PAIN (Patient taking differently: Take 7.5 mg by mouth daily. ) 30 tablet 1  . Multiple Vitamins-Minerals (MULTIVITAMIN GUMMIES ADULT) CHEW Chew 1 tablet by mouth daily. Vitafurion    . Polyethyl Glycol-Propyl Glycol (SYSTANE) 0.4-0.3 % SOLN Place 1 drop into both eyes daily as needed (Dry eye).    .  pravastatin (PRAVACHOL) 40 MG tablet Take 1 tablet (40 mg total) by mouth daily. (Patient taking differently: Take 40 mg by mouth at bedtime. ) 90 tablet 1  . VENTOLIN HFA 108 (90 Base) MCG/ACT inhaler TAKE 2 PUFFS BY MOUTH EVERY 6 HOURS AS NEEDED FOR WHEEZE OR SHORTNESS OF BREATH 18 Inhaler 5  . omeprazole (PRILOSEC) 40 MG capsule TAKE 1 CAPSULE BY MOUTH EVERY DAY (Patient not taking: No sig reported) 90 capsule 1   No current facility-administered medications for this visit.   Facility-Administered Medications Ordered in Other Visits  Medication Dose Route Frequency Provider Last Rate Last Admin  . heparin lock flush 100 unit/mL  500 Units Intravenous Once Charlaine Dalton R, MD      . sodium chloride flush (NS) 0.9 % injection 10 mL  10 mL Intravenous Once Charlaine Dalton R, MD      . sodium chloride flush (NS) 0.9 % injection 10 mL  10 mL Intravenous Once [place], Jamicheal Heard R,  MD      . Tbo-Filgrastim [facility]) injection 480 mcg  480 mcg Subcutaneous Once Cammie Sickle, MD        PHYSICAL EXAMINATION: ECOG PERFORMANCE STATUS: 0 - Asymptomatic  BP 127/68 (Patient Position: Sitting)   Pulse 70   Temp 98.4 F (36.9 C) (Oral)   Resp 20   Ht 5\' 5"  (1.651 m)   Wt 243 lb (110.2 kg)   BMI 40.44 kg/m   Filed Weights   10/21/19 1428  Weight: 243 lb (110.2 kg)    Physical Exam  Constitutional: She is oriented to person, place, and time and well-developed, well-nourished, and in no distress.  Obese.  Walking herself with cane.   HENT:  Head: Normocephalic and atraumatic.  Mouth/Throat: Oropharynx is clear and moist. No oropharyngeal exudate.  Eyes: Pupils are equal, round, and reactive to light.  Cardiovascular: Normal rate and regular rhythm.  Pulmonary/Chest: No respiratory distress. She has no wheezes.  Abdominal: Soft. Bowel sounds are normal. She exhibits no distension and no mass. There is no abdominal tenderness. There is no rebound and no guarding.   Musculoskeletal:        General: No tenderness or edema. Normal range of motion.     Cervical back: Normal range of motion and neck supple.  Neurological: She is alert and oriented to person, place, and time.  Skin: Skin is warm.  Psychiatric: Affect normal.   LABORATORY DATA:  I have reviewed the data as listed    Component Value Date/Time   NA 140 10/20/2019 0952   NA 145 (H) 09/08/2019 1359   NA 142 09/14/2013 1127   K 3.5 10/20/2019 0952   K 3.5 09/14/2013 1127   CL 102 10/20/2019 0952   CL 110 (H) 09/14/2013 1127   CO2 30 10/20/2019 0952   CO2 29 09/14/2013 1127   GLUCOSE 104 (H) 10/20/2019 0952   GLUCOSE 106 (H) 09/14/2013 1127   BUN 16 10/20/2019 0952   BUN 15 09/08/2019 1359   BUN 8 09/14/2013 1127   CREATININE 0.66 10/20/2019 0952   CREATININE 0.71 09/14/2013 1127   CREATININE 0.68 04/01/2013 1550   CALCIUM 8.9 10/20/2019 0952   CALCIUM 8.6 09/14/2013 1127   PROT 7.1 10/20/2019 0952   PROT 7.6 09/14/2013 1127   ALBUMIN 4.1 10/20/2019 0952   ALBUMIN 3.2 (L) 09/14/2013 1127   AST 23 10/20/2019 0952   AST 22 09/14/2013 1127   ALT 14 10/20/2019 0952   ALT 19 09/14/2013 1127   ALKPHOS 75 10/20/2019 0952   ALKPHOS 76 09/14/2013 1127   BILITOT 0.8 10/20/2019 0952   BILITOT 0.4 09/14/2013 1127   GFRNONAA >60 10/20/2019 0952   GFRNONAA >60 09/14/2013 1127   GFRAA >60 10/20/2019 0952   GFRAA >60 09/14/2013 1127    No results found for: SPEP, UPEP  Lab Results  Component Value Date   WBC 6.7 10/20/2019   NEUTROABS 4.6 10/20/2019   HGB 13.8 10/20/2019   HCT 42.2 10/20/2019   MCV 89.8 10/20/2019   PLT 187 10/20/2019      Chemistry      Component Value Date/Time   NA 140 10/20/2019 0952   NA 145 (H) 09/08/2019 1359   NA 142 09/14/2013 1127   K 3.5 10/20/2019 0952   K 3.5 09/14/2013 1127   CL 102 10/20/2019 0952   CL 110 (H) 09/14/2013 1127   CO2 30 10/20/2019 0952   CO2 29 09/14/2013 1127   BUN 16 10/20/2019 [id number]  BUN 15 09/08/2019 1359   BUN 8  09/14/2013 1127   CREATININE 0.66 10/20/2019 0952   CREATININE 0.71 09/14/2013 1127   CREATININE 0.68 04/01/2013 1550      Component Value Date/Time   CALCIUM 8.9 10/20/2019 0952   CALCIUM 8.6 09/14/2013 1127   ALKPHOS 75 10/20/2019 0952   ALKPHOS 76 09/14/2013 1127   AST 23 10/20/2019 0952   AST 22 09/14/2013 1127   ALT 14 10/20/2019 0952   ALT 19 09/14/2013 1127   BILITOT 0.8 10/20/2019 0952   BILITOT 0.4 09/14/2013 1127       RADIOGRAPHIC STUDIES: I have personally reviewed the radiological images as listed and agreed with the findings in the report. No results found.   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  Stable except continued mild bil lung base scarring/see below/dilated pulmonary artery.  STABLE.   #  I would continue surveillance at this time; given the high risk of recurrence.    # Chronic arthritis-NSAIDs as needed. STABLE.   #  Bilateral lung base scarring- ? Chronic/-STABLE; Dilated PA; ? pulmonary hypertension-clinically STABLE.   # DISPOSITION: #  follow up in 3 months-MD /labs-cbc/cmp/ldh-.B  # I reviewed the blood work- with the patient in detail; also reviewed the imaging independently [as summarized above]; and with the patient in detail.      No orders of the defined types were placed in this encounter.  All questions were answered. The patient knows to call the clinic with any problems, questions or concerns.      Cammie Sickle, MD 11/04/2019 1:16 PM

## 2019-10-21 NOTE — Assessment & Plan Note (Addendum)
#  Mantle cell lymphoma stage IV-currently on surveillance [last Rituxan August 2019]; MAY 20th 2021-  2020 CT scan no evidence of recurrence of lymphoma/no splenomegaly.  Stable except continued mild bil lung base scarring/see below/dilated pulmonary artery.  STABLE.   #  I would continue surveillance at this time; given the high risk of recurrence.    # Chronic arthritis-NSAIDs as needed. STABLE.   #  Bilateral lung base scarring- ? Chronic/-STABLE; Dilated PA; ? pulmonary hypertension-clinically STABLE.   # DISPOSITION: #  follow up in 3 months-MD /labs-cbc/cmp/ldh-.B  # I reviewed the blood work- with the patient in detail; also reviewed the imaging independently [as summarized above]; and with the patient in detail.

## 2019-10-26 ENCOUNTER — Ambulatory Visit: Payer: Medicare HMO | Admitting: Internal Medicine

## 2019-11-04 ENCOUNTER — Other Ambulatory Visit: Payer: Self-pay

## 2019-11-04 ENCOUNTER — Telehealth: Payer: Self-pay | Admitting: Family

## 2019-11-04 DIAGNOSIS — J40 Bronchitis, not specified as acute or chronic: Secondary | ICD-10-CM

## 2019-11-04 MED ORDER — ALBUTEROL SULFATE HFA 108 (90 BASE) MCG/ACT IN AERS: INHALATION_SPRAY | RESPIRATORY_TRACT | 1 refills | Status: DC

## 2019-11-04 NOTE — Telephone Encounter (Signed)
Pt wants a refill on VENTOLIN HFA 108 (90 Base) MCG/ACT inhaler. She also wants a referral for mammogram

## 2019-11-04 NOTE — Telephone Encounter (Signed)
I called patient to let her know inhaler had been sent. I also called [facility] to find out why patient couldn't schedule mammogram. I received VM so will try back Monday bc they may be closed for the day?

## 2019-11-14 ENCOUNTER — Ambulatory Visit (INDEPENDENT_AMBULATORY_CARE_PROVIDER_SITE_OTHER): Payer: Medicare HMO

## 2019-11-14 VITALS — Ht 65.0 in | Wt 243.0 lb

## 2019-11-14 DIAGNOSIS — Z0000 Encounter for general adult medical examination without abnormal findings: Secondary | ICD-10-CM | POA: Diagnosis not present

## 2019-11-14 NOTE — Patient Instructions (Addendum)
  Michelle [name] , Thank you for taking time to come for your Medicare Wellness Visit. I appreciate your ongoing commitment to your health goals. Please review the following plan we discussed and let me know if I can assist you in the future.   These are the goals we discussed: Goals      Patient Stated   .  Increase physical activity (pt-stated)      I want to get back to [facility] for exercises and use of stationary bike      Other   .  Weight (lb) < 250 lb (113.4 kg)      Lose 10 pounds       This is a list of the screening recommended for you and due dates:  Health Maintenance  Topic Date Due  .  Hepatitis C: One time screening is recommended by Center for Disease Control  (CDC) for  adults born from [birth year] through 1963.   Never done  . COVID-19 Vaccine (2 - Pfizer 2-dose series) 08/08/2019  . Tetanus Vaccine  11/13/2019  . Flu Shot  01/01/2020  . DEXA scan (bone density measurement)  Completed  . Pneumonia vaccines  Completed

## 2019-11-14 NOTE — Progress Notes (Addendum)
Subjective:   Michelle [name] is a 79 y.o. female who presents for Medicare Annual (Subsequent) preventive examination.  Review of Systems:  No ROS.  Medicare Wellness Virtual Visit.   Cardiac Risk Factors include: advanced age (>47men, >52 women)     Objective:     Vitals: Ht 5\' 5"  (1.651 m)   Wt 243 lb (110.2 kg)   BMI 40.44 kg/m   Body mass index is 40.44 kg/m.  Advanced Directives 11/14/2019 09/20/2019 07/22/2019 04/21/2019 02/24/2019 12/24/2018 11/12/2018  Does Patient Have a Medical Advance Directive? No No No No No No No  Does patient want to make changes to medical advance directive? - - - - - - -  Would patient like information on creating a medical advance directive? No - Patient declined Yes (MAU/Ambulatory/Procedural Areas - Information given) No - Patient declined No - Patient declined No - Patient declined No - Patient declined No - Patient declined    Tobacco Social History   Tobacco Use  Smoking Status Never Smoker  Smokeless Tobacco Never Used     Counseling given: Not Answered   Clinical Intake:  Pre-visit preparation completed: Yes        Diabetes: No  How often do you need to have someone help you when you read instructions, pamphlets, or other written materials from your doctor or pharmacy?: 1 - Never  Interpreter Needed?: No     Past Medical History:  Diagnosis Date  . Arthritis   . Collagen vascular disease ([place])   . GERD (gastroesophageal reflux disease)   . Headache(784.0)   . History of methotrexate therapy   . Hyperlipidemia    hx  . Lymphadenopathy of head and neck 01/2015   see on Thyroid ultrasound  . 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 Personal history of chemotherapy   . Rheumatoid arthritis [facility])    Past Surgical History:  Procedure Laterality Date  . CARDIAC CATHETERIZATION  09/2004   [facility]; EF 60%  . CARDIAC CATHETERIZATION  08/2004   [facility]  . IR  FLUORO GUIDED NEEDLE PLC ASPIRATION/INJECTION LOC  06/18/2018  . PERIPHERAL VASCULAR CATHETERIZATION N/A 07/04/2015   Procedure: [name] Insertion;  Surgeon: [name], MD;  Location: [place] [facility];  Service: Cardiovascular;  Laterality: N/A;  . PORTA CATH REMOVAL N/A 06/23/2018   Procedure: PORTA CATH REMOVAL;  Surgeon: [name], MD;  Location: [place] [facility];  Service: Cardiovascular;  Laterality: N/A;  . RIGHT/LEFT HEART CATH AND CORONARY ANGIOGRAPHY Bilateral 09/20/2019   Procedure: RIGHT/LEFT HEART CATH AND CORONARY ANGIOGRAPHY;  Surgeon: Nelva Bush, MD;  Location: [place] [facility];  Service: Cardiovascular;  Laterality: Bilateral;   Family History  Problem Relation Age of Onset  . Diabetes Mother   . Cholelithiasis Mother   . Hypertension Sister   . Diabetes Sister   . Heart murmur Sister   . Arthritis Brother   . Breast cancer Neg Hx    Social History   Socioeconomic History  . Marital status: Married    Spouse name: Not on file  . Number of children: Not on file  . Years of education: Not on file  . Highest education level: Not on file  Occupational History  . Not on file  Tobacco Use  . Smoking status: Never Smoker  . Smokeless tobacco: Never Used  Vaping Use  . Vaping Use: Never used  Substance and  Sexual Activity  . Alcohol use: No  . Drug use: No  . Sexual activity: Never  Other Topics Concern  . Not on file  Social History Narrative   ** Merged History Encounter **       Lives in [place]. Works as Psychiatrist.   Social Determinants of Health   Financial Resource Strain:   . Difficulty of Paying Living Expenses:   Food Insecurity:   . Worried About Charity fundraiser in the Last Year:   . Arboriculturist in the Last Year:   Transportation Needs:   . Film/video editor (Medical):   [name] Lack of Transportation (Non-Medical):   Physical Activity:   . Days of Exercise per Week:   . Minutes of Exercise per Session:     Stress:   . Feeling of Stress :   Social Connections:   . Frequency of Communication with Friends and Family:   . Frequency of Social Gatherings with Friends and Family:   . Attends Religious Services:   . Active Member of Clubs or Organizations:   . Attends Archivist Meetings:   [name] Marital Status:     Outpatient Encounter Medications as of 11/14/2019  Medication Sig  . acetaminophen (TYLENOL) 500 MG tablet Take 1,000 mg by mouth every 6 (six) hours as needed for mild pain.   [name] albuterol (VENTOLIN HFA) 108 (90 Base) MCG/ACT inhaler TAKE 2 PUFFS BY MOUTH EVERY 6 HOURS AS NEEDED FOR WHEEZE OR SHORTNESS OF BREATH  . Ascorbic Acid (VITAMIN C) 1000 MG tablet Take 1,000 mg by mouth daily.  . Calcium Carbonate-Vit D-Min (CALTRATE 600+D PLUS) 600-800 MG-UNIT CHEW Chew 1 tablet by mouth 2 (two) times daily. (Patient taking differently: Chew 1 tablet by mouth daily. )  . Cholecalciferol (VITAMIN D3) 50 MCG (2000 UT) capsule Take 2,000 Units by mouth daily.  . cyanocobalamin 2000 MCG tablet Take 2,500 mcg by mouth daily.   . fluticasone (FLONASE) 50 MCG/ACT nasal spray Place 2 sprays into both nostrils daily as needed for allergies.   . furosemide (LASIX) 40 MG tablet Take 1 tablet (40 mg total) by mouth daily.  [name] losartan (COZAAR) 50 MG tablet Take 1 tablet (50 mg total) by mouth daily.  . meclizine (ANTIVERT) 25 MG tablet Take 25 mg by mouth 3 (three) times daily as needed for dizziness.  [name] MEGARED OMEGA-3 KRILL OIL PO Take 1 tablet by mouth daily. 4 in 1  . meloxicam (MOBIC) 7.5 MG tablet TAKE 1 TABLET BY MOUTH EVERY DAY AS NEEDED FOR PAIN (Patient taking differently: Take 7.5 mg by mouth daily. )  . Multiple Vitamins-Minerals (MULTIVITAMIN GUMMIES ADULT) CHEW Chew 1 tablet by mouth daily. Vitafurion  . omeprazole (PRILOSEC) 40 MG capsule TAKE 1 CAPSULE BY MOUTH EVERY DAY (Patient not taking: No sig reported)  . Polyethyl Glycol-Propyl Glycol (SYSTANE) 0.4-0.3 % SOLN Place 1 drop into  both eyes daily as needed (Dry eye).  . pravastatin (PRAVACHOL) 40 MG tablet Take 1 tablet (40 mg total) by mouth daily. (Patient taking differently: Take 40 mg by mouth at bedtime. )   Facility-Administered Encounter Medications as of 11/14/2019  Medication  . heparin lock flush 100 unit/mL  . sodium chloride flush (NS) 0.9 % injection 10 mL  . sodium chloride flush (NS) 0.9 % injection 10 mL  . Tbo-Filgrastim (GRANIX) injection 480 mcg    Activities of Daily Living In your present state of health, do you have any difficulty performing the  following activities: 11/14/2019 09/20/2019  Hearing? N N  Vision? N N  Difficulty concentrating or making decisions? N N  Walking or climbing stairs? Y Y  Comment Unsteady gait -  Dressing or bathing? N N  Doing errands, shopping? N -  Preparing Food and eating ? N -  Using the Toilet? N -  In the past six months, have you accidently leaked urine? N -  Do you have problems with loss of bowel control? N -  Managing your Medications? N -  Managing your Finances? N -  Housekeeping or managing your Housekeeping? N -  Some recent data might be hidden    Patient Care Team: Burnard Hawthorne, FNP as PCP - General (Family Medicine) Jackolyn Confer, MD (Internal Medicine)    Assessment:   This is a routine wellness examination for [place].  I connected with Mahek today by telephone and verified that I am speaking with the correct person using two identifiers. Location patient: home Location provider: work Persons participating in the virtual visit: patient, Marine scientist.    I discussed the limitations, risks, security and privacy concerns of performing an evaluation and management service by telephone and the availability of in person appointments. The patient expressed understanding and verbally consented to this telephonic visit.    Interactive audio and video telecommunications were attempted between this provider and patient, however failed,  due to patient having technical difficulties OR patient did not have access to video capability.  We continued and completed visit with audio only.  Some vital signs may be absent or patient reported.   Health Maintenance Due: -Tdap vaccine- discussed; to be completed with doctor in visit or local pharmacy.   -Covid vaccine- completed series. Agrees to bring immunization record to the office to update record.   -Mammogram- plans to schedule. Number provided.  See completed HM at the end of note.   Eye: Visual acuity not assessed. Virtual visit. Followed by their ophthalmologist.  Dental: UTD    Hearing: Demonstrates normal hearing during visit.  Safety:  Patient feels safe at home- yes Patient does have smoke detectors at home- yes Patient does wear sunscreen or protective clothing when in direct sunlight - yes Patient does wear seat belt when in a moving vehicle - yes Patient drives- yes Adequate lighting in walkways free from debris- yes Grab bars and handrails used as appropriate- yes Ambulates with an assistive device- yes; cane/walker Cell phone on person when ambulating outside of the home-yes  Social: Alcohol intake - no     Smoking history- never   Smokers in home? none Illicit drug use? none  Medication: Taking as directed and without issues.  Pill box in use -yes  Self managed - yes   Covid-19: Precautions and sickness symptoms discussed. Wears mask, social distancing, hand hygiene as appropriate.   Activities of Daily Living Patient denies needing assistance with: household chores, feeding themselves, getting from bed to chair, getting to the toilet, bathing/showering, dressing, managing money, or preparing meals.   Discussed the importance of a healthy diet, water intake and the benefits of aerobic exercise.   Physical activity- stretches daily  Diet:  Regular Water: good intake Caffeine: 1 cup of coffee  Other Providers Patient Care Team: Burnard Hawthorne, FNP as PCP - General (Family Medicine) Jackolyn Confer, MD (Internal Medicine)   Exercise Activities and Dietary recommendations Current Exercise Habits: Home exercise routine, Type of exercise: stretching, Intensity: Mild  Goals  Patient Stated   .  Increase physical activity (pt-stated)      I want to get back to [facility] for exercises and use of stationary bike      Other   .  Weight (lb) < 250 lb (113.4 kg)      Lose 10 pounds       Fall Risk Fall Risk  11/14/2019 11/12/2018 11/09/2017 01/29/2017 11/07/2016  Falls in the past year? 0 0 No No Yes  Number falls in past yr: - - - - 1  Injury with Fall? - - - - Yes  Comment - - - - Twisted L ankle 2 months a dog.  Medical attention declined.  Follow up Falls evaluation completed - - - Education provided;Falls prevention discussed   Is the patient's home free of loose throw rugs in walkways, pet beds, electrical cords, etc?  Yes      Grab bars in the bathroom? Yes      Handrails on the stairs?  Yes      Adequate lighting?  Yes  Timed Get Up and Go performed: No, virtual visit  Depression Screen PHQ 2/9 Scores 11/14/2019 11/12/2018 06/28/2018 11/09/2017  PHQ - 2 Score 0 0 0 0  PHQ- 9 Score - - 2 -     Cognitive Function Patient is alert and oriented x3. Patient denies difficulty focusing or concentrating. Patient currently works driving in patient transportation.   MMSE - Mini Mental State Exam 11/07/2016  Orientation to time 5  Orientation to Place 5  Registration 3  Attention/ Calculation 3  Attention/Calculation-comments Some difficulty calculating   Recall 3  Language- name 2 objects 2  Language- repeat 1  Language- follow 3 step command 3  Language- read & follow direction 1  Write a sentence 1  Copy design 1  Total score 28     6CIT Screen 11/14/2019 11/12/2018 11/09/2017  What Year? 0 points 0 points 0 points  What month? 0 points 0 points 0 points  What time? 0 points 0 points 0 points  Count  back from 20 0 points 0 points 0 points  Months in reverse - - 4 points  Repeat phrase - - 0 points  Total Score - - 4    Immunization History  Administered Date(s) Administered  . Influenza Split 02/18/2012  . Influenza, High Dose Seasonal PF 01/29/2017, 02/19/2018  . Influenza,inj,Quad PF,6+ Mos 04/01/2013, 03/02/2014, 06/25/2015  . Influenza-Unspecified 03/14/2016, 02/23/2019  . PFIZER SARS-COV-2 Vaccination 07/18/2019  . Pneumococcal Conjugate-13 01/12/2014  . Pneumococcal Polysaccharide-23 11/12/2009  . Tdap 11/12/2009  . Zoster Recombinat (Shingrix) 03/15/2019, 05/20/2019   Screening Tests Health Maintenance  Topic Date Due  . Hepatitis C Screening  Never done  . COVID-19 Vaccine (2 - Pfizer 2-dose series) 08/08/2019  . TETANUS/TDAP  11/13/2019  . INFLUENZA VACCINE  01/01/2020  . DEXA SCAN  Completed  . PNA vac Low Risk Adult  Completed    Cancer Screenings: Lung: Low Dose CT Chest recommended if Age 22-80 years, 30 pack-year currently smoking OR have quit w/in 15years. Patient does not qualify.  Additional Screenings: Hepatitis C Screening: Pans to further discuss with physician.      Plan:   Keep all routine maintenance appointments.   Medicare Attestation I have personally reviewed: The patient's medical and social history Their use of alcohol, tobacco or illicit drugs Their current medications and supplements The patient's functional ability including ADLs,fall risks, home safety risks, cognitive, and hearing and visual impairment Diet and  physical activities Evidence for depression   I have reviewed and discussed with patient certain preventive protocols, quality metrics, and best practice recommendations.      Varney Biles, LPN  3/58/2518   Agree with plan. Mable Paris, NP

## 2019-11-15 ENCOUNTER — Ambulatory Visit: Payer: Medicare HMO

## 2019-11-21 ENCOUNTER — Other Ambulatory Visit: Payer: Self-pay

## 2019-11-21 ENCOUNTER — Ambulatory Visit: Payer: Medicare HMO | Admitting: Internal Medicine

## 2019-11-21 ENCOUNTER — Encounter: Payer: Self-pay | Admitting: Internal Medicine

## 2019-11-21 VITALS — BP 104/66 | HR 67 | Ht 66.0 in | Wt 243.5 lb

## 2019-11-21 DIAGNOSIS — I428 Other cardiomyopathies: Secondary | ICD-10-CM | POA: Diagnosis not present

## 2019-11-21 DIAGNOSIS — I5022 Chronic systolic (congestive) heart failure: Secondary | ICD-10-CM | POA: Diagnosis not present

## 2019-11-21 MED ORDER — BISOPROLOL FUMARATE 5 MG PO TABS: 2.5000 mg | ORAL_TABLET | Freq: Every day | ORAL | 2 refills | Status: DC

## 2019-11-21 NOTE — Patient Instructions (Signed)
Medication Instructions:  Your physician has recommended you make the following change in your medication:  1- START Bisoprolol 2.5 mg (0.5 tablet) by mouth once a day.  *If you need a refill on your cardiac medications before your next appointment, please call your pharmacy*   Lab Work: none If you have labs (blood work) drawn today and your tests are completely normal, you will receive your results only by: [name] MyChart Message (if you have MyChart) OR . A paper copy in the mail If you have any lab test that is abnormal or we need to change your treatment, we will call you to review the results.   Testing/Procedures: In about 1 month on same day prior to appointment in office - Your physician has requested that you have an LIMITED echocardiogram. Echocardiography is a painless test that uses sound waves to create images of your heart. It provides your doctor with information about the size and shape of your heart and how well your heart's chambers and valves are working. This procedure takes approximately one hour. There are no restrictions for this procedure. You may get an IV, if needed, to receive an ultrasound enhancing agent through to better visualize your heart.    Follow-Up: At [facility], you and your health needs are our priority.  As part of our continuing mission to provide you with exceptional heart care, we have created designated Provider Care Teams.  These Care Teams include your primary Cardiologist (physician) and Advanced Practice Providers (APPs -  Physician Assistants and Nurse Practitioners) who all work together to provide you with the care you need, when you need it.  We recommend signing up for the patient portal called "MyChart".  Sign up information is provided on this After Visit Summary.  MyChart is used to connect with patients for Virtual Visits (Telemedicine).  Patients are able to view lab/test results, encounter notes, upcoming appointments, etc.   Non-urgent messages can be sent to your provider as well.   To learn more about what you can do with MyChart, go to [email or web address].    Your next appointment:   Approximately 1 month(s)  The format for your next appointment:   In Person  Provider:    You may see DR Harrell Gave END or one of the following Advanced Practice Providers on your designated Care Team:    Murray Hodgkins, NP  Christell Faith, PA-C

## 2019-11-21 NOTE — Progress Notes (Signed)
Follow-up Outpatient Visit Date: 11/21/2019  Primary Care Provider: Burnard Hawthorne, FNP [address] [place] [address]  Chief Complaint: Follow-up heart failure  HPI:  Michelle [name] is a 79 y.o. 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  I last saw her in early May, at which time Michelle [name] reported improving shortness of breath, though she still had some exertional dyspnea and two-pillow orthopnea.  We agreed to increase losartan and furosemide.  Today, Michelle [name] reports that she feels slightly better than at our prior visits.  She still has some fatigue and exertional dyspnea with modest activity, though this is slowly improving.  She denies chest pain, palpitations, lightheadedness, orthopnea, or edema.  She is tolerating her medications well, though Michelle [name] admits that she skips furosemide at least a few days a week because it is difficult for her to use the bathroom when she is working.  --------------------------------------------------------------------------------------------------  Cardiovascular History & Procedures: Cardiovascular Problems:  LBBB  Cardiomyopathy  Risk Factors:  Hyperlipidemia, age, and obesity  Cath/PCI:  R/LHC (09/20/2019): No angiographically significant CAD.  Normal left heart filling pressures.  Upper normal to mildly elevated right heart and pulmonary artery pressures.  Normal Fick cardiac output/index.  LHC (09/05/2004): No significant coronary artery disease. LVEF 60%.  CV Surgery:  None  EP Procedures and Devices:  None  Non-Invasive Evaluation(s):  TTE (08/26/2019): Normal LV size and wall thickness with LVEF of 30-35%. There is global hypokinesis with severe hypokinesis of the anterior and anteroseptal walls. Normal RV size and  function. Mild pulmonary hypertension.Mild mitral regurgitation. Moderate tricuspid regurgitation.  TTE (12/16/2018): Normal LV size with mild LVH. LVEF 50-55% with grade 1 diastolic dysfunction. Normal RV size and function. Mild left atrial enlargement.  Recent CV Pertinent Labs: Lab Results  Component Value Date   CHOL 142 12/17/2018   HDL 61.40 12/17/2018   LDLCALC 71 12/17/2018   TRIG 49.0 12/17/2018   CHOLHDL 2 12/17/2018   INR 1.0 09/08/2019   K 3.5 10/20/2019   K 3.5 09/14/2013   BUN 16 10/20/2019   BUN 15 09/08/2019   BUN 8 09/14/2013   CREATININE 0.66 10/20/2019   CREATININE 0.71 09/14/2013   CREATININE 0.68 04/01/2013    Past medical and surgical history were reviewed and updated in EPIC.  Current Meds  Medication Sig  . acetaminophen (TYLENOL) 500 MG tablet Take 1,000 mg by mouth every 6 (six) hours as needed for mild pain.   [name] albuterol (VENTOLIN HFA) 108 (90 Base) MCG/ACT inhaler TAKE 2 PUFFS BY MOUTH EVERY 6 HOURS AS NEEDED FOR WHEEZE OR SHORTNESS OF BREATH  . Ascorbic Acid (VITAMIN C) 1000 MG tablet Take 1,000 mg by mouth daily.  . Calcium Carbonate-Vit D-Min (CALTRATE 600+D PLUS) 600-800 MG-UNIT CHEW Chew 1 tablet by mouth 2 (two) times daily. (Patient taking differently: Chew 1 tablet by mouth daily. )  . Cholecalciferol (VITAMIN D3) 50 MCG (2000 UT) capsule Take 2,000 Units by mouth daily.  . cyanocobalamin 2000 MCG tablet Take 2,500 mcg by mouth daily.   . fluticasone (FLONASE) 50 MCG/ACT nasal spray Place 2 sprays into both nostrils daily as needed for allergies.   . furosemide (LASIX) 40 MG tablet Take 1 tablet (40 mg total) by mouth daily.  [name] losartan (COZAAR) 50 MG tablet Take 1 tablet (50 mg total) by mouth daily.  . meclizine (  ANTIVERT) 25 MG tablet Take 25 mg by mouth 3 (three) times daily as needed for dizziness.  [name] MEGARED OMEGA-3 KRILL OIL PO Take 1 tablet by mouth daily. 4 in 1  . meloxicam (MOBIC) 7.5 MG tablet TAKE 1 TABLET BY MOUTH EVERY  DAY AS NEEDED FOR PAIN (Patient taking differently: Take 7.5 mg by mouth daily. )  . Multiple Vitamins-Minerals (MULTIVITAMIN GUMMIES ADULT) CHEW Chew 1 tablet by mouth daily. Vitafurion  . omeprazole (PRILOSEC) 40 MG capsule TAKE 1 CAPSULE BY MOUTH EVERY DAY  . Polyethyl Glycol-Propyl Glycol (SYSTANE) 0.4-0.3 % SOLN Place 1 drop into both eyes daily as needed (Dry eye).  . pravastatin (PRAVACHOL) 40 MG tablet Take 1 tablet (40 mg total) by mouth daily. (Patient taking differently: Take 40 mg by mouth at bedtime. )    Allergies: Patient has no known allergies.  Social History   Tobacco Use  . Smoking status: Never Smoker  . Smokeless tobacco: Never Used  Vaping Use  . Vaping Use: Never used  Substance Use Topics  . Alcohol use: No  . Drug use: No    Family History  Problem Relation Age of Onset  . Diabetes Mother   . Cholelithiasis Mother   . Hypertension Sister   . Diabetes Sister   . Heart murmur Sister   . Arthritis Brother   . Breast cancer Neg Hx     Review of Systems: A 12-system review of systems was performed and was negative except as noted in the HPI.  --------------------------------------------------------------------------------------------------  Physical Exam: BP 104/66 (BP Location: Left Arm, Patient Position: Sitting, Cuff Size: Normal)   Pulse 67   Ht 5\' 6"  (1.676 m)   Wt 243 lb 8 oz (110.5 kg)   SpO2 96%   BMI 39.30 kg/m   General: NAD. Neck: No JVD or HJR, though body habitus limits evaluation. Lungs: Clear to auscultation bilaterally without wheezes or crackles. Heart: Regular rate and rhythm without murmurs, rubs, or gallops. Abdomen: Soft, nontender, nondistended. Extremities: No lower extremity edema.  EKG: Normal sinus rhythm with left axis deviation and left bundle branch block.  Lab Results  Component Value Date   WBC 6.7 10/20/2019   HGB 13.8 10/20/2019   HCT 42.2 10/20/2019   MCV 89.8 10/20/2019   PLT 187 10/20/2019    Lab  Results  Component Value Date   NA 140 10/20/2019   K 3.5 10/20/2019   CL 102 10/20/2019   CO2 30 10/20/2019   BUN 16 10/20/2019   CREATININE 0.66 10/20/2019   GLUCOSE 104 (H) 10/20/2019   ALT 14 10/20/2019    Lab Results  Component Value Date   CHOL 142 12/17/2018   HDL 61.40 12/17/2018   LDLCALC 71 12/17/2018   TRIG 49.0 12/17/2018   CHOLHDL 2 12/17/2018    --------------------------------------------------------------------------------------------------  ASSESSMENT AND PLAN: Chronic HFrEF due to nonischemic cardiomyopathy: Michelle [name] appears grossly euvolemic and reports slight improvement in fatigue/dyspnea, though her functional capacity remains limited consistent with NYHA class III heart failure.  I have encouraged her to try to take furosemide on a daily basis, even if it is a little bit later in the day after she is returned home from work.  Though her blood pressure and heart rate are lower normal, we have agreed to add bisoprolol 2.5 mg nightly in an effort to optimize her evidence-based heart failure therapy.  We will continue losartan 50 mg daily.  Michelle [name] will return to the office in about 1 month, at  which time we will repeat a limited echo to reassess her EF.  If it remains less than 35%, we will consider referral to EP for consideration of CRT-D.  Follow-up: Return to clinic in 1 month.  Nelva Bush, MD 11/21/2019 3:55 PM

## 2019-11-22 ENCOUNTER — Encounter: Payer: Self-pay | Admitting: Internal Medicine

## 2019-11-26 ENCOUNTER — Other Ambulatory Visit: Payer: Self-pay | Admitting: Family

## 2019-11-26 DIAGNOSIS — E785 Hyperlipidemia, unspecified: Secondary | ICD-10-CM

## 2019-12-06 ENCOUNTER — Telehealth: Payer: Self-pay | Admitting: Internal Medicine

## 2019-12-06 NOTE — Telephone Encounter (Signed)
Pt c/o medication issue:  1. Name of Medication: bisoprolol   2. How are you currently taking this medication (dosage and times per day)? 5 MG 0.5 tablets daily   3. Are you having a reaction (difficulty breathing--STAT)? Doesn't feel right - feels lousy   4. What is your medication issue? Dr End added this medication (bisoprolol) along with adding to the dosage of losartan recently.  Patient states it is making her feel worse.  Please call to discuss.

## 2019-12-06 NOTE — Telephone Encounter (Signed)
Please have the patient stop taking bisoprolol and let us know if symptoms persist.  Nelva Bush, MD [place]

## 2019-12-06 NOTE — Telephone Encounter (Signed)
Patient notified that ok to stop bisoprolol. Med list updated.

## 2019-12-13 DIAGNOSIS — H52209 Unspecified astigmatism, unspecified eye: Secondary | ICD-10-CM | POA: Diagnosis not present

## 2019-12-13 DIAGNOSIS — H5213 Myopia, bilateral: Secondary | ICD-10-CM | POA: Diagnosis not present

## 2019-12-13 DIAGNOSIS — H524 Presbyopia: Secondary | ICD-10-CM | POA: Diagnosis not present

## 2019-12-22 NOTE — Progress Notes (Signed)
Cardiology Office Note    Date:  12/30/2019   ID:  Tacha, Manni 1940-08-27, MRN [id number]  PCP:  Burnard Hawthorne, FNP  Cardiologist:  Nelva Bush, MD  Electrophysiologist:  None   Chief Complaint: Follow up  History of Present Illness:   Michelle [name] is a 79 y.o. female with history of HFrEF secondary to NICM, rheumatoid arthritis, mantle cell lymphoma, HLD, LBBB, and GERD who presents for follow-up of her cardiomyopathy.  Remote LHC from 08/2004 showed no significant CAD with an LVEF of 60%.  Prior echo from 12/2018 showed an EF of 50 to 55%, mild LVH, grade 1 diastolic dysfunction, normal RV systolic function and ventricular cavity size, and mild biatrial enlargement.  More recently, echo in 08/2019 showed a new cardiomyopathy with an LVEF of 30 to 35%, global hypokinesis with severe hypokinesis of the anterior and anteroseptal walls, normal RV systolic function and ventricular cavity size, mild pulmonary hypertension, mild mitral regurgitation, moderate tricuspid regurgitation.  Diagnostic R/LHC in 09/2019 showed no angiographically significant CAD with normal left heart filling pressures.  Upper normal to mildly elevated right heart and pulmonary artery pressures.  Normal cardiac output/index.  She has been managed with GDMT with symptom improvement.  She was most recently seen on 11/21/2019 feeling slightly better than she had at her prior visits.  She still had some fatigue and exertional dyspnea with modest activity though this was improving.  She was given furosemide a few days per week as it was difficult for her to use the restroom while working.  She was euvolemic with a weight of 243 pounds.  She remained NYHA class III.  She was started on bisoprolol 2.5 mg nightly and continued on losartan with recommendation to attempt to take Lasix on a daily basis.  He subsequently received a phone call indicating she was not tolerating bisoprolol and in the setting this was  discontinued.  She underwent follow-up limited echo on maximally tolerated GDMT on 7/26 which showed an EF of 35 to 40%, severe HK of the anteroseptal and anterior wall, normal RV systolic function and RV cavity size, and no significant valvular abnormality.  She comes in accompanied by her sister today.  She is doing well from a cardiac perspective.  She feels about the same as she did at her last visit with continued fatigue and exertional dyspnea with mild to modest activity.  She is tolerating losartan without issue.  She indicates she takes Lasix 40 mg approximately 3 days/week.  She does add some salt to her food and drinks over 2 L of liquids per day.  She has stable two-pillow orthopnea.  She denies any lower extremity swelling, abdominal distention, PND, or early satiety.  No chest pain, palpitations, dizziness, presyncope, or syncope.  Since discontinuing bisoprolol she feels like her energy has improved.   Labs independently reviewed: 10/2019 - Hgb 13.8, PLT 187, potassium 3.5, BUN 16, serum creatinine 0.66, albumin 4.1, AST/ALT normal 12/2018 - TC 142, TG 49, HDL 61, LDL 71, TSH normal  Past Medical History:  Diagnosis Date  . Arthritis   . Collagen vascular disease ([place])   . GERD (gastroesophageal reflux disease)   . Headache(784.0)   . History of methotrexate therapy   . Hyperlipidemia    hx  . Lymphadenopathy of head and neck 01/2015   see on Thyroid ultrasound  . Lymphoma, mantle cell ([place]) 06/01/2015   bx of lymph node in right breast/Stage IV Mantle Cell Lymphoma  .  Personal history of chemotherapy   . Rheumatoid arthritis [facility])     Past Surgical History:  Procedure Laterality Date  . CARDIAC CATHETERIZATION  09/2004   [facility]; EF 60%  . CARDIAC CATHETERIZATION  08/2004   [facility]  . IR FLUORO GUIDED NEEDLE PLC ASPIRATION/INJECTION LOC  06/18/2018  . PERIPHERAL VASCULAR CATHETERIZATION N/A 07/04/2015   Procedure: [name] Insertion;  Surgeon: [name], MD;  Location: [place] [facility];  Service: Cardiovascular;  Laterality: N/A;  . PORTA CATH REMOVAL N/A 06/23/2018   Procedure: PORTA CATH REMOVAL;  Surgeon: [name], MD;  Location: [place] [facility];  Service: Cardiovascular;  Laterality: N/A;  . RIGHT/LEFT HEART CATH AND CORONARY ANGIOGRAPHY Bilateral 09/20/2019   Procedure: RIGHT/LEFT HEART CATH AND CORONARY ANGIOGRAPHY;  Surgeon: Nelva Bush, MD;  Location: [place] [facility];  Service: Cardiovascular;  Laterality: Bilateral;    Current Medications: Current Meds  Medication Sig  . acetaminophen (TYLENOL) 500 MG tablet Take 1,000 mg by mouth every 6 (six) hours as needed for mild pain.   [name] albuterol (VENTOLIN HFA) 108 (90 Base) MCG/ACT inhaler TAKE 2 PUFFS BY MOUTH EVERY 6 HOURS AS NEEDED FOR WHEEZE OR SHORTNESS OF BREATH  . Ascorbic Acid (VITAMIN C) 1000 MG tablet Take 1,000 mg by mouth daily.  . Calcium Carbonate-Vit D-Min (CALTRATE 600+D PLUS) 600-800 MG-UNIT CHEW Chew 1 tablet by mouth 2 (two) times daily. (Patient taking differently: Chew 1 tablet by mouth daily. )  . Cholecalciferol (VITAMIN D3) 50 MCG (2000 UT) capsule Take 2,000 Units by mouth daily.  . cyanocobalamin 2000 MCG tablet Take 2,500 mcg by mouth daily.   . fluticasone (FLONASE) 50 MCG/ACT nasal spray Place 2 sprays into both nostrils daily as needed for allergies.   . furosemide (LASIX) 40 MG tablet Take 1 tablet (40 mg total) by mouth daily.  [name] losartan (COZAAR) 50 MG tablet Take 1 tablet (50 mg total) by mouth daily.  . meclizine (ANTIVERT) 25 MG tablet Take 25 mg by mouth 3 (three) times daily as needed for dizziness.  [name] MEGARED OMEGA-3 KRILL OIL PO Take 1 tablet by mouth daily. 4 in 1  . meloxicam (MOBIC) 7.5 MG tablet TAKE 1 TABLET BY MOUTH EVERY DAY AS NEEDED FOR PAIN (Patient taking differently: Take 7.5 mg by mouth daily. )  . Multiple Vitamins-Minerals (MULTIVITAMIN GUMMIES ADULT) CHEW Chew 1 tablet by mouth daily. Vitafurion  . omeprazole (PRILOSEC) 40 MG capsule  TAKE 1 CAPSULE BY MOUTH EVERY DAY  . Polyethyl Glycol-Propyl Glycol (SYSTANE) 0.4-0.3 % SOLN Place 1 drop into both eyes daily as needed (Dry eye).  . pravastatin (PRAVACHOL) 40 MG tablet TAKE 1 TABLET BY MOUTH EVERY DAY    Allergies:   Patient has no known allergies.   Social History   Socioeconomic History  . Marital status: Married    Spouse name: Not on file  . Number of children: Not on file  . Years of education: Not on file  . Highest education level: Not on file  Occupational History  . Not on file  Tobacco Use  . Smoking status: Never Smoker  . Smokeless tobacco: Never Used  Vaping Use  . Vaping Use: Never used  Substance and Sexual Activity  . Alcohol use: No  . Drug use: No  . Sexual activity: Never  Other Topics Concern  . Not on file  Social History Narrative   ** Merged History Encounter **       Lives in [place].  Works as Psychiatrist.   Social Determinants of Health   Financial Resource Strain:   . Difficulty of Paying Living Expenses:   Food Insecurity:   . Worried About Charity fundraiser in the Last Year:   . Arboriculturist in the Last Year:   Transportation Needs:   . Film/video editor (Medical):   [name] Lack of Transportation (Non-Medical):   Physical Activity:   . Days of Exercise per Week:   . Minutes of Exercise per Session:   Stress:   . Feeling of Stress :   Social Connections:   . Frequency of Communication with Friends and Family:   . Frequency of Social Gatherings with Friends and Family:   . Attends Religious Services:   . Active Member of Clubs or Organizations:   . Attends Archivist Meetings:   [name] Marital Status:      Family History:  The patient's family history includes Arthritis in her brother; Cholelithiasis in her mother; Diabetes in her mother and sister; Heart murmur in her sister; Hypertension in her sister. There is no history of Breast cancer.  ROS:   Review of Systems  Constitutional: Positive  for malaise/fatigue. Negative for chills, diaphoresis, fever and weight loss.  HENT: Negative for congestion.   Eyes: Negative for discharge and redness.  Respiratory: Positive for shortness of breath. Negative for cough, sputum production and wheezing.   Cardiovascular: Positive for orthopnea. Negative for chest pain, palpitations, claudication, leg swelling and PND.  Gastrointestinal: Negative for abdominal pain, heartburn, nausea and vomiting.  Musculoskeletal: Negative for falls and myalgias.  Skin: Negative for rash.  Neurological: Positive for weakness. Negative for dizziness, tingling, tremors, sensory change, speech change, focal weakness and loss of consciousness.  Endo/Heme/Allergies: Does not bruise/bleed easily.  Psychiatric/Behavioral: Negative for substance abuse. The patient is not nervous/anxious.   All other systems reviewed and are negative.    EKGs/Labs/Other Studies Reviewed:    Studies reviewed were summarized above. The additional studies were reviewed today:  2D echo 08/2019: 1. Left ventricular ejection fraction, by estimation, is 30 to 35%. The  left ventricle has moderately decreased function. The left ventricle  demonstrates global hypokinesis with severe hypokinesis of teh anterior,  anteroseptal wall.  2. Right ventricular systolic function is normal. The right ventricular  size is normal. There is moderately elevated pulmonary artery systolic  pressure. The estimated right ventricular systolic pressure is 41.9 mmHg.  3. Mild mitral valve regurgitation.  4. Tricuspid valve regurgitation is moderate.  5. The inferior vena cava is normal in size with greater than 50%  respiratory variability, suggesting right atrial pressure of 3 mmHg.   Comparison(s): EF 50-55%. __________  [facility] 09/2019: Conclusions: 1. No angiographically significant coronary artery disease. 2. Normal left heart filling pressures. 3. Upper normal to mildly elevated right heart  and pulmonary artery pressures. 4. Normal Fick cardiac output/index.  Recommendations: 1. Optimize goal-directed medical therapy for management of nonischemic cardiomyopathy.  We will increase losartan to 25 mg daily.  BMP should be repeated in approximately 2 weeks to ensure stable renal function and potassium. 2. Primary prevention of coronary artery disease. __________  Limited 2D echo 12/2019: 1. Left ventricular ejection fraction, by estimation, is 35 to 40%. The  left ventricle has moderately decreased function. The left ventricle has  no regional wall motion abnormalities. There is severe hypokinesis of the  left ventricular, anteroseptal  wall and anterior wall.  2. Right ventricular systolic function is normal. The  right ventricular  size is normal. There is normal pulmonary artery systolic pressure.  3. The mitral valve is normal in structure. No evidence of mitral valve  regurgitation. No evidence of mitral stenosis.  4. The aortic valve is normal in structure. Aortic valve regurgitation is  not visualized. No aortic stenosis is present.   EKG:  EKG is ordered today.  The EKG ordered today demonstrates NSR, 61 bpm, LBBB (known), no significant change when compared to prior  Recent Labs: 10/20/2019: ALT 14; BUN 16; Creatinine, Ser 0.66; Hemoglobin 13.8; Platelets 187; Potassium 3.5; Sodium 140  Recent Lipid Panel    Component Value Date/Time   CHOL 142 12/17/2018 0841   TRIG 49.0 12/17/2018 0841   HDL 61.40 12/17/2018 0841   CHOLHDL 2 12/17/2018 0841   VLDL 9.8 12/17/2018 0841   LDLCALC 71 12/17/2018 0841    PHYSICAL EXAM:    VS:  BP 100/68 (BP Location: Left Arm, Patient Position: Sitting, Cuff Size: Normal)   Pulse 61   Ht 5\' 7"  (1.702 m)   Wt (!) 242 lb 4 oz (109.9 kg)   SpO2 99%   BMI 37.94 kg/m   BMI: Body mass index is 37.94 kg/m.  Physical Exam Constitutional:      Appearance: She is well-developed.  HENT:     Head: Normocephalic and atraumatic.    Eyes:     General:        Right eye: No discharge.        Left eye: No discharge.  Neck:     Vascular: No JVD.  Cardiovascular:     Rate and Rhythm: Normal rate and regular rhythm.     Pulses: No midsystolic click and no opening snap.          Posterior tibial pulses are 2+ on the right side and 2+ on the left side.     Heart sounds: Normal heart sounds, S1 normal and S2 normal. Heart sounds not distant. No murmur heard.  No friction rub.  Pulmonary:     Effort: Pulmonary effort is normal. No respiratory distress.     Breath sounds: Normal breath sounds. No decreased breath sounds, wheezing or rales.  Chest:     Chest wall: No tenderness.  Abdominal:     General: There is no distension.     Palpations: Abdomen is soft.     Tenderness: There is no abdominal tenderness.  Musculoskeletal:     Cervical back: Normal range of motion.  Skin:    General: Skin is warm and dry.     Nails: There is no clubbing.  Neurological:     Mental Status: She is alert and oriented to person, place, and time.  Psychiatric:        Speech: Speech normal.        Behavior: Behavior normal.        Thought Content: Thought content normal.        Judgment: Judgment normal.     Wt Readings from Last 3 Encounters:  12/30/19 (!) 242 lb 4 oz (109.9 kg)  11/21/19 243 lb 8 oz (110.5 kg)  11/14/19 243 lb (110.2 kg)     ASSESSMENT & PLAN:   1. HFrEF secondary to NICM: She appears grossly euvolemic and well compensated.  She notes continued stable fatigue and dyspnea with NYHA class III symptoms.  Unfortunately, she did not tolerate bisoprolol secondary to significant profound fatigue.  Her blood pressure and heart rate precludes escalation of GDMT at this  time.  She remains on losartan 50 mg daily.  I am unable to add beta-blocker secondary to hypotension and heart rate.  I cannot transition her to [facility] or add spironolactone given relative hypotension.  Refer to EP for consideration of CRT with  underlying cardiomyopathy and LBBB.  She does continue to add salt to food and drink greater than 2 liters of liquid daily.  CHF education.   2. HLD: LDL 71 from 12/2018.  Tolerating pravastatin.  Disposition: F/u with Dr. Saunders Revel or an APP in 2 months following EP appointment.   Medication Adjustments/Labs and Tests Ordered: Current medicines are reviewed at length with the patient today.  Concerns regarding medicines are outlined above. Medication changes, Labs and Tests ordered today are summarized above and listed in the Patient Instructions accessible in Encounters.   Signed, Christell Faith, PA-C 12/30/2019 2:38 PM     [address] [place], [place] [address] [phone]

## 2019-12-26 ENCOUNTER — Ambulatory Visit (INDEPENDENT_AMBULATORY_CARE_PROVIDER_SITE_OTHER): Payer: Medicare HMO

## 2019-12-26 ENCOUNTER — Other Ambulatory Visit: Payer: Self-pay

## 2019-12-26 DIAGNOSIS — I428 Other cardiomyopathies: Secondary | ICD-10-CM | POA: Diagnosis not present

## 2019-12-26 DIAGNOSIS — I5022 Chronic systolic (congestive) heart failure: Secondary | ICD-10-CM | POA: Diagnosis not present

## 2019-12-28 LAB — ECHOCARDIOGRAM LIMITED
Calc EF: 48.7 %
Single Plane A2C EF: 50 %
Single Plane A4C EF: 49 %

## 2019-12-30 ENCOUNTER — Ambulatory Visit: Payer: Medicare HMO | Admitting: Physician Assistant

## 2019-12-30 ENCOUNTER — Other Ambulatory Visit: Payer: Self-pay

## 2019-12-30 ENCOUNTER — Encounter: Payer: Self-pay | Admitting: Physician Assistant

## 2019-12-30 VITALS — BP 100/68 | HR 61 | Ht 67.0 in | Wt 242.2 lb

## 2019-12-30 DIAGNOSIS — E785 Hyperlipidemia, unspecified: Secondary | ICD-10-CM

## 2019-12-30 DIAGNOSIS — I5020 Unspecified systolic (congestive) heart failure: Secondary | ICD-10-CM | POA: Diagnosis not present

## 2019-12-30 DIAGNOSIS — I447 Left bundle-branch block, unspecified: Secondary | ICD-10-CM

## 2019-12-30 DIAGNOSIS — I428 Other cardiomyopathies: Secondary | ICD-10-CM | POA: Diagnosis not present

## 2019-12-30 NOTE — Patient Instructions (Signed)
Medication Instructions:  Your physician recommends that you continue on your current medications as directed. Please refer to the Current Medication list given to you today.  *If you need a refill on your cardiac medications before your next appointment, please call your pharmacy*   Lab Work:none ordered If you have labs (blood work) drawn today and your tests are completely normal, you will receive your results only by: [name] MyChart Message (if you have MyChart) OR . A paper copy in the mail If you have any lab test that is abnormal or we need to change your treatment, we will call you to review the results.   Testing/Procedures: None ordered   Follow-Up: At [facility], you and your health needs are our priority.  As part of our continuing mission to provide you with exceptional heart care, we have created designated Provider Care Teams.  These Care Teams include your primary Cardiologist (physician) and Advanced Practice Providers (APPs -  Physician Assistants and Nurse Practitioners) who all work together to provide you with the care you need, when you need it.  We recommend signing up for the patient portal called "MyChart".  Sign up information is provided on this After Visit Summary.  MyChart is used to connect with patients for Virtual Visits (Telemedicine).  Patients are able to view lab/test results, encounter notes, upcoming appointments, etc.  Non-urgent messages can be sent to your provider as well.   To learn more about what you can do with MyChart, go to [email or web address].    Your next appointment:   2 month(s)  The format for your next appointment:   In Person  Provider:    You may see Nelva Bush, MD or Christell Faith, PA-C  Other Instructions Referral to EP  Dr Caryl Comes

## 2020-01-01 ENCOUNTER — Other Ambulatory Visit: Payer: Self-pay | Admitting: Family

## 2020-01-01 DIAGNOSIS — J40 Bronchitis, not specified as acute or chronic: Secondary | ICD-10-CM

## 2020-01-20 ENCOUNTER — Inpatient Hospital Stay: Payer: Medicare HMO | Attending: Internal Medicine

## 2020-01-20 ENCOUNTER — Inpatient Hospital Stay (HOSPITAL_BASED_OUTPATIENT_CLINIC_OR_DEPARTMENT_OTHER): Payer: Medicare HMO | Admitting: Internal Medicine

## 2020-01-20 ENCOUNTER — Other Ambulatory Visit: Payer: Self-pay

## 2020-01-20 DIAGNOSIS — C8311 Mantle cell lymphoma, lymph nodes of head, face, and neck: Secondary | ICD-10-CM

## 2020-01-20 DIAGNOSIS — I272 Pulmonary hypertension, unspecified: Secondary | ICD-10-CM | POA: Diagnosis not present

## 2020-01-20 LAB — CBC WITH DIFFERENTIAL/PLATELET
Abs Immature Granulocytes: 0.02 10*3/uL (ref 0.00–0.07)
Basophils Absolute: 0 10*3/uL (ref 0.0–0.1)
Basophils Relative: 0 %
Eosinophils Absolute: 0.1 10*3/uL (ref 0.0–0.5)
Eosinophils Relative: 3 %
HCT: 37.8 % (ref 36.0–46.0)
Hemoglobin: 12.5 g/dL (ref 12.0–15.0)
Immature Granulocytes: 0 %
Lymphocytes Relative: 22 %
Lymphs Abs: 1.2 10*3/uL (ref 0.7–4.0)
MCH: 29.7 pg (ref 26.0–34.0)
MCHC: 33.1 g/dL (ref 30.0–36.0)
MCV: 89.8 fL (ref 80.0–100.0)
Monocytes Absolute: 0.3 10*3/uL (ref 0.1–1.0)
Monocytes Relative: 6 %
Neutro Abs: 3.7 10*3/uL (ref 1.7–7.7)
Neutrophils Relative %: 69 %
Platelets: 180 10*3/uL (ref 150–400)
RBC: 4.21 MIL/uL (ref 3.87–5.11)
RDW: 14.6 % (ref 11.5–15.5)
WBC: 5.4 10*3/uL (ref 4.0–10.5)
nRBC: 0 % (ref 0.0–0.2)

## 2020-01-20 LAB — COMPREHENSIVE METABOLIC PANEL
ALT: 16 U/L (ref 0–44)
AST: 20 U/L (ref 15–41)
Albumin: 3.5 g/dL (ref 3.5–5.0)
Alkaline Phosphatase: 69 U/L (ref 38–126)
Anion gap: 7 (ref 5–15)
BUN: 16 mg/dL (ref 8–23)
CO2: 28 mmol/L (ref 22–32)
Calcium: 8.6 mg/dL — ABNORMAL LOW (ref 8.9–10.3)
Chloride: 104 mmol/L (ref 98–111)
Creatinine, Ser: 0.76 mg/dL (ref 0.44–1.00)
GFR calc Af Amer: >60 mL/min (ref >60)
GFR calc non Af Amer: >60 mL/min (ref >60)
Glucose, Bld: 108 mg/dL — ABNORMAL HIGH (ref 70–99)
Potassium: 4.1 mmol/L (ref 3.5–5.1)
Sodium: 139 mmol/L (ref 135–145)
Total Bilirubin: 0.4 mg/dL (ref 0.3–1.2)
Total Protein: 6.6 g/dL (ref 6.5–8.1)

## 2020-01-20 LAB — LACTATE DEHYDROGENASE: LDH: 160 U/L (ref 98–192)

## 2020-01-20 NOTE — Assessment & Plan Note (Addendum)
#  Mantle cell lymphoma stage IV-currently on surveillance [last Rituxan August 2019]; MAY 20th 2021-  2020 CT scan no evidence of recurrence of lymphoma/no splenomegaly.  Stable except continued mild bil lung base scarring/see below/dilated pulmonary artery.  Stable  #  I would continue surveillance at this time; given the high risk of recurrence; ordered CT chest abdomen pelvis..    # Chronic arthritis-NSAIDs as needed. STABLE.   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  Reviewed cardiology notes.  #Pulmonary hypertension/chronic bilateral lung scarring- [s/p pulmonary evaluation]-improved on albuterol.  Stable.   COVID BOOSTER: Discussed given patient's diagnosis and other comorbidities/therapies-patient would be considered immunocompromised.  As per CDC recommendation/FDA approval-I would recommend booster vaccine.  Patient is interested.   # DISPOSITION: #  follow up in 3 months-MD /labs-cbc/cmp/ldh; CT C/A/P prior-.B

## 2020-01-20 NOTE — Progress Notes (Signed)
[facility] OFFICE PROGRESS NOTE  Patient Care Team: Burnard Hawthorne, FNP as PCP - General (Family Medicine) End, Harrell Gave, MD as PCP - Cardiology (Cardiology) Jackolyn Confer, MD (Internal Medicine)  Cancer Staging No matching staging information was found for the patient.   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 Feb 9th- START Benda-Ritux with neulasta; Prolonged neutropenia; DISCONT- Benda-Ritux;   # April 13 th 2017- START R-CHOP x1; severe/prolonged neutropenia; PET- CR; BMBx-Neg; Disc R-CHOP  # 26th MAY 2017- Start Rituxan q 42M Main OCT 12th 2017- PET NED.  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  female pleasant patient above history of mantle cell lymphoma currently on surveillance is here for follow-up/is here for follow-up.  Patient denies any weight loss.  Denies any abdominal pain.  No lumps or bumps.  Headaches.  No chest pain.  No fevers.  No night sweats.  Unfortunately patient's lymphoma has reoccurred.  She is obviously very concerned.  Review of Systems  Constitutional: Negative for chills, diaphoresis, fever, malaise/fatigue and weight loss.  HENT: Negative for nosebleeds and sore throat.   Eyes: Negative for double vision.  Respiratory: Negative for hemoptysis, sputum production and wheezing.   Cardiovascular: Negative for chest pain, palpitations, orthopnea and leg swelling.   Gastrointestinal: Negative for abdominal pain, blood in stool, constipation, diarrhea, heartburn, melena, nausea and vomiting.  Genitourinary: Negative for dysuria, frequency and urgency.  Musculoskeletal: Negative for back pain and joint pain.  Skin: Negative.  Negative for itching and rash.  Neurological: Negative for dizziness, tingling, focal weakness, weakness and headaches.  Endo/Heme/Allergies: Does not bruise/bleed easily.  Psychiatric/Behavioral: Negative for depression. The patient is not nervous/anxious and does not have insomnia.     PAST MEDICAL HISTORY :  Past Medical History:  Diagnosis Date  . Arthritis   . Collagen vascular disease ([place])   . GERD (gastroesophageal reflux disease)   . Headache(784.0)   . History of methotrexate therapy   . Hyperlipidemia    hx  . Lymphadenopathy of head and neck 01/2015   see on Thyroid ultrasound  . Lymphoma, mantle cell ([place]) 06/01/2015   bx of lymph node in right breast/Stage IV Mantle Cell Lymphoma  . Personal history of chemotherapy   . Rheumatoid arthritis ([place])     PAST SURGICAL HISTORY :   Past Surgical History:  Procedure Laterality Date  . CARDIAC CATHETERIZATION  09/2004   [facility]; EF 60%  . CARDIAC CATHETERIZATION  08/2004   [facility]  . IR FLUORO GUIDED NEEDLE PLC ASPIRATION/INJECTION LOC  06/18/2018  . PERIPHERAL VASCULAR CATHETERIZATION N/A 07/04/2015   Procedure: [name] Insertion;  Surgeon: [name], MD;  Location: [facility];  Service: Cardiovascular;  Laterality: N/A;  . PORTA CATH REMOVAL N/A 06/23/2018   Procedure: PORTA CATH REMOVAL;  Surgeon: [name], MD;  Location: [facility];  Service: Cardiovascular;  Laterality: N/A;  . RIGHT/LEFT HEART CATH  AND CORONARY ANGIOGRAPHY Bilateral 09/20/2019   Procedure: RIGHT/LEFT HEART CATH AND CORONARY ANGIOGRAPHY;  Surgeon: Nelva Bush, MD;  Location: [facility];  Service: Cardiovascular;  Laterality: Bilateral;    FAMILY HISTORY :    Family History  Problem Relation Age of Onset  . Diabetes Mother   . Cholelithiasis Mother   . Hypertension Sister   . Diabetes Sister   . Heart murmur Sister   . Arthritis Brother   . Breast cancer Neg Hx     SOCIAL HISTORY:   Social History   Tobacco Use  . Smoking status: Never Smoker  . Smokeless tobacco: Never Used  Vaping Use  . Vaping Use: Never used  Substance Use Topics  . Alcohol use: No  . Drug use: No    ALLERGIES:  has No Known Allergies.  MEDICATIONS:  Current Outpatient Medications  Medication Sig Dispense Refill  . acetaminophen (TYLENOL) 500 MG tablet Take 1,000 mg by mouth every 6 (six) hours as needed for mild pain.     [name] albuterol (VENTOLIN HFA) 108 (90 Base) MCG/ACT inhaler TAKE 2 PUFFS BY MOUTH EVERY 6 HOURS AS NEEDED FOR WHEEZE OR SHORTNESS OF BREATH 18 g 1  . Ascorbic Acid (VITAMIN C) 1000 MG tablet Take 1,000 mg by mouth daily.    . Calcium Carbonate-Vit D-Min (CALTRATE 600+D PLUS) 600-800 MG-UNIT CHEW Chew 1 tablet by mouth 2 (two) times daily. (Patient taking differently: Chew 1 tablet by mouth daily. ) 60 tablet 6  . Cholecalciferol (VITAMIN D3) 50 MCG (2000 UT) capsule Take 2,000 Units by mouth daily.    . cyanocobalamin 2000 MCG tablet Take 2,500 mcg by mouth daily.     . fluticasone (FLONASE) 50 MCG/ACT nasal spray Place 2 sprays into both nostrils daily as needed for allergies.   4  . furosemide (LASIX) 40 MG tablet Take 1 tablet (40 mg total) by mouth daily. 90 tablet 1  . losartan (COZAAR) 50 MG tablet Take 1 tablet (50 mg total) by mouth daily. 90 tablet 1  . meclizine (ANTIVERT) 25 MG tablet Take 25 mg by mouth 3 (three) times daily as needed for dizziness.    [name] MEGARED OMEGA-3 KRILL OIL PO Take 1 tablet by mouth daily. 4 in 1    . meloxicam (MOBIC) 7.5 MG tablet TAKE 1 TABLET BY MOUTH EVERY DAY AS NEEDED FOR PAIN (Patient taking differently: Take 7.5 mg by mouth daily. ) 30 tablet 1  . Multiple Vitamins-Minerals (MULTIVITAMIN GUMMIES  ADULT) CHEW Chew 1 tablet by mouth daily. Vitafurion    . omeprazole (PRILOSEC) 40 MG capsule TAKE 1 CAPSULE BY MOUTH EVERY DAY 90 capsule 1  . Polyethyl Glycol-Propyl Glycol (SYSTANE) 0.4-0.3 % SOLN Place 1 drop into both eyes daily as needed (Dry eye).    . pravastatin (PRAVACHOL) 40 MG tablet TAKE 1 TABLET BY MOUTH EVERY DAY 90 tablet 1   No current facility-administered medications for this visit.   Facility-Administered Medications Ordered in Other Visits  Medication Dose Route Frequency Provider Last Rate Last Admin  . heparin lock flush 100 unit/mL  500 Units Intravenous Once Charlaine Dalton R, MD      . sodium chloride flush (NS) 0.9 % injection 10 mL  10 mL Intravenous Once Charlaine Dalton R, MD      . sodium chloride flush (NS) 0.9 % injection 10 mL  10 mL Intravenous Once Charlaine Dalton R, MD      . Tbo-Filgrastim [facility]) injection 480 mcg  480 mcg  Subcutaneous Once Cammie Sickle, MD        PHYSICAL EXAMINATION: ECOG PERFORMANCE STATUS: 0 - Asymptomatic  BP 104/68   Pulse 67   Temp 97.9 F (36.6 C) (Tympanic)   Resp 20   Ht 5\' 7"  (1.702 m)   Wt 244 lb 8 oz (110.9 kg)   BMI 38.29 kg/m   Filed Weights   01/20/20 1319  Weight: 244 lb 8 oz (110.9 kg)    Physical Exam Constitutional:      Comments: Obese.  Walking herself with cane.   HENT:     Head: Normocephalic and atraumatic.     Mouth/Throat:     Pharynx: No oropharyngeal exudate.  Eyes:     Pupils: Pupils are equal, round, and reactive to light.  Cardiovascular:     Rate and Rhythm: Normal rate and regular rhythm.  Pulmonary:     Effort: No respiratory distress.     Breath sounds: No wheezing.  Abdominal:     General: Bowel sounds are normal. There is no distension.     Palpations: Abdomen is soft. There is no mass.     Tenderness: There is no abdominal tenderness. There is no guarding or rebound.  Musculoskeletal:        General: No tenderness. Normal range of motion.      Cervical back: Normal range of motion and neck supple.  Skin:    General: Skin is warm.  Neurological:     Mental Status: She is alert and oriented to person, place, and time.  Psychiatric:        Mood and Affect: Affect normal.    LABORATORY DATA:  I have reviewed the data as listed    Component Value Date/Time   NA 139 01/20/2020 1303   NA 145 (H) 09/08/2019 1359   NA 142 09/14/2013 1127   K 4.1 01/20/2020 1303   K 3.5 09/14/2013 1127   CL 104 01/20/2020 1303   CL 110 (H) 09/14/2013 1127   CO2 28 01/20/2020 1303   CO2 29 09/14/2013 1127   GLUCOSE 108 (H) 01/20/2020 1303   GLUCOSE 106 (H) 09/14/2013 1127   BUN 16 01/20/2020 1303   BUN 15 09/08/2019 1359   BUN 8 09/14/2013 1127   CREATININE 0.76 01/20/2020 1303   CREATININE 0.71 09/14/2013 1127   CREATININE 0.68 04/01/2013 1550   CALCIUM 8.6 (L) 01/20/2020 1303   CALCIUM 8.6 09/14/2013 1127   PROT 6.6 01/20/2020 1303   PROT 7.6 09/14/2013 1127   ALBUMIN 3.5 01/20/2020 1303   ALBUMIN 3.2 (L) 09/14/2013 1127   AST 20 01/20/2020 1303   AST 22 09/14/2013 1127   ALT 16 01/20/2020 1303   ALT 19 09/14/2013 1127   ALKPHOS 69 01/20/2020 1303   ALKPHOS 76 09/14/2013 1127   BILITOT 0.4 01/20/2020 1303   BILITOT 0.4 09/14/2013 1127   GFRNONAA >60 01/20/2020 1303   GFRNONAA >60 09/14/2013 1127   GFRAA >60 01/20/2020 1303   GFRAA >60 09/14/2013 1127    No results found for: SPEP, UPEP  Lab Results  Component Value Date   WBC 5.4 01/20/2020   NEUTROABS 3.7 01/20/2020   HGB 12.5 01/20/2020   HCT 37.8 01/20/2020   MCV 89.8 01/20/2020   PLT 180 01/20/2020      Chemistry      Component Value Date/Time   NA 139 01/20/2020 1303   NA 145 (H) 09/08/2019 1359   NA 142 09/14/2013 1127   K 4.1 01/20/2020 1303  K 3.5 09/14/2013 1127   CL 104 01/20/2020 1303   CL 110 (H) 09/14/2013 1127   CO2 28 01/20/2020 1303   CO2 29 09/14/2013 1127   BUN 16 01/20/2020 1303   BUN 15 09/08/2019 1359   BUN 8 09/14/2013 1127    CREATININE 0.76 01/20/2020 1303   CREATININE 0.71 09/14/2013 1127   CREATININE 0.68 04/01/2013 1550      Component Value Date/Time   CALCIUM 8.6 (L) 01/20/2020 1303   CALCIUM 8.6 09/14/2013 1127   ALKPHOS 69 01/20/2020 1303   ALKPHOS 76 09/14/2013 1127   AST 20 01/20/2020 1303   AST 22 09/14/2013 1127   ALT 16 01/20/2020 1303   ALT 19 09/14/2013 1127   BILITOT 0.4 01/20/2020 1303   BILITOT 0.4 09/14/2013 1127       RADIOGRAPHIC STUDIES: I have personally reviewed the radiological images as listed and agreed with the findings in the report. No results found.   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  Stable except continued mild bil lung base scarring/see below/dilated pulmonary artery.  Stable  #  I would continue surveillance at this time; given the high risk of recurrence; ordered CT chest abdomen pelvis..    # Chronic arthritis-NSAIDs as needed. STABLE.   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  Reviewed cardiology notes.  #Pulmonary hypertension/chronic bilateral lung scarring- [s/p pulmonary evaluation]-improved on albuterol.  Stable.   COVID BOOSTER: Discussed given patient's diagnosis and other comorbidities/therapies-patient would be considered immunocompromised.  As per CDC recommendation/FDA approval-I would recommend booster vaccine.  Patient is interested.   # DISPOSITION: #  follow up in 3 months-MD /labs-cbc/cmp/ldh; CT C/A/P prior-.B     Orders Placed This Encounter  Procedures  . CT CHEST W CONTRAST    Standing Status:   Future    Standing Expiration Date:   01/19/2021    Order Specific Question:   If indicated for the ordered procedure, I authorize the administration of contrast media per Radiology protocol     Answer:   Yes    Order Specific Question:   Preferred imaging location?    Answer:   [facility]    Order Specific Question:   Radiology Contrast Protocol - do NOT remove file path    Answer:   [email or web address]  . CT ABDOMEN PELVIS W CONTRAST    Standing Status:   Future    Standing Expiration Date:   01/19/2021    Order Specific Question:   If indicated for the ordered procedure, I authorize the administration of contrast media per Radiology protocol    Answer:   Yes    Order Specific Question:   Preferred imaging location?    Answer:   [facility]    Order Specific Question:   Radiology Contrast Protocol - do NOT remove file path    Answer:   [email or web address]  . CBC with Differential    Standing Status:   Future    Standing Expiration Date:   01/19/2021  . Comprehensive metabolic panel    Standing Status:   Future    Standing Expiration Date:   01/19/2021  . Lactate dehydrogenase    Standing Status:   Future    Standing Expiration Date:   01/19/2021   All questions were answered. The  patient knows to call the clinic with any problems, questions or concerns.      Cammie Sickle, MD 01/20/2020 2:32 PM

## 2020-01-25 ENCOUNTER — Telehealth: Payer: Self-pay | Admitting: Internal Medicine

## 2020-01-25 NOTE — Telephone Encounter (Signed)
[employer] calling  Needing detailed information about procedure completed by Dr End on 09/20/19 CPT code and diagnosis code Please call to discuss  [phone] Reference number [id number]

## 2020-02-04 ENCOUNTER — Other Ambulatory Visit: Payer: Self-pay | Admitting: Family

## 2020-03-03 DIAGNOSIS — Z20822 Contact with and (suspected) exposure to covid-19: Secondary | ICD-10-CM | POA: Diagnosis not present

## 2020-03-04 ENCOUNTER — Other Ambulatory Visit: Payer: Self-pay | Admitting: Family

## 2020-03-04 DIAGNOSIS — J40 Bronchitis, not specified as acute or chronic: Secondary | ICD-10-CM

## 2020-03-07 NOTE — Progress Notes (Signed)
Cardiology Office Note    Date:  03/09/2020   ID:  Michelle [name] Mar 27, 1941, MRN [id number]  PCP:  Burnard Hawthorne, FNP  Cardiologist:  Nelva Bush, MD  Electrophysiologist:  None   Chief Complaint: Follow-up  History of Present Illness:   Michelle [name] is a 79 y.o. female with history of HFrEF secondary to NICM, rheumatoid arthritis, mantle cell lymphoma, HLD, LBBB, and GERD who presents for follow-up of her cardiomyopathy.  Remote LHC from 08/2004 showed no significant CAD with an LVEF of 60%.  Prior echo from 12/2018 showed an EF of 50 to 55%, mild LVH, grade 1 diastolic dysfunction, normal RV systolic function and ventricular cavity size, and mild biatrial enlargement.  More recently, echo in 08/2019 showed a new cardiomyopathy with an LVEF of 30 to 35%, global hypokinesis with severe hypokinesis of the anterior and anteroseptal walls, normal RV systolic function and ventricular cavity size, mild pulmonary hypertension, mild mitral regurgitation, moderate tricuspid regurgitation.  Diagnostic R/LHC in 09/2019 showed no angiographically significant CAD with normal left heart filling pressures.  Upper normal to mildly elevated right heart and pulmonary artery pressures.  Normal cardiac output/index.  She has been managed with GDMT with symptom improvement.  She was seen on 11/21/2019 feeling slightly better than she had at her prior visits.  She still had some fatigue and exertional dyspnea with modest activity though this was improving.  She was taking furosemide a few days per week as it was difficult for her to use the restroom while working.  She was euvolemic with a weight of 243 pounds.  She remained NYHA class III.  She was started on bisoprolol 2.5 mg nightly and continued on losartan with recommendation to attempt to take Lasix on a daily basis.  We subsequently received a phone call indicating she was not tolerating bisoprolol and in this setting, it was discontinued.  She  underwent follow-up limited echo on maximally tolerated GDMT on 12/26/2019, which showed an EF of 35 to 40%, severe HK of the anteroseptal and anterior wall, normal RV systolic function and RV cavity size, and no significant valvular abnormality.  She was last seen in the office on 12/30/2019 and was doing well from a cardiac perspective.  She continued to feel about the same as she did with her last visit with continued fatigue and exertional dyspnea with mild to modest activity.  She was taking Lasix 40 mg approximately 3 days/week, was adding salt to some of her foods, and drinking over 2 L of liquid per day.  Her weight was stable at 242 pounds.  Since discontinuing bisoprolol she felt like her energy had improved.  Her vital signs precluded escalation of GDMT at that time.  She was referred to EP for consideration of CRT with underlying LBBB with persistent cardiomyopathy on maximally tolerated GDMT.  She comes in accompanied by her sister today.  She notes her appetite has been down over the past several weeks.  She has been drinking a herbal life shake in the morning which has been curbing her appetite.  She is not drinking much water.  With this, she has noted some persistent fatigue and positional dizziness.  No presyncope or syncope.  No chest pain, worsening dyspnea, palpitations, lower extremity swelling, abdominal distention, orthopnea, PND, or early satiety.  She is not adding salt to food.  Her weight has remained stable at home.  She is not checking her blood pressure at home.  She continues to  take Lasix approximately 3 days/week on average.  She will be evaluated by EP later this month for consideration of CRT in the setting of persistent cardiomyopathy despite maximally tolerated GDMT with underlying LBBB.   Labs independently reviewed: 01/2020 - potassium 4.1, BUN 16, serum creatinine 0.76, albumin 3.5, AST/ALT normal, Hgb 12.5, PLT 180 12/2018 - TC 142, TG 49, HDL 61, LDL 71, TSH  normal  Past Medical History:  Diagnosis Date  . Arthritis   . Collagen vascular disease ([place])   . GERD (gastroesophageal reflux disease)   . Headache(784.0)   . History of methotrexate therapy   . Hyperlipidemia    hx  . Lymphadenopathy of head and neck 01/2015   see on Thyroid ultrasound  . 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 Personal history of chemotherapy   . Rheumatoid arthritis [facility])     Past Surgical History:  Procedure Laterality Date  . CARDIAC CATHETERIZATION  09/2004   [facility]; EF 60%  . CARDIAC CATHETERIZATION  08/2004   [facility]  . IR FLUORO GUIDED NEEDLE PLC ASPIRATION/INJECTION LOC  06/18/2018  . PERIPHERAL VASCULAR CATHETERIZATION N/A 07/04/2015   Procedure: [name] Insertion;  Surgeon: [name], MD;  Location: [facility];  Service: Cardiovascular;  Laterality: N/A;  . PORTA CATH REMOVAL N/A 06/23/2018   Procedure: PORTA CATH REMOVAL;  Surgeon: [name], MD;  Location: [facility];  Service: Cardiovascular;  Laterality: N/A;  . RIGHT/LEFT HEART CATH AND CORONARY ANGIOGRAPHY Bilateral 09/20/2019   Procedure: RIGHT/LEFT HEART CATH AND CORONARY ANGIOGRAPHY;  Surgeon: Nelva Bush, MD;  Location: [facility];  Service: Cardiovascular;  Laterality: Bilateral;    Current Medications: Current Meds  Medication Sig  . acetaminophen (TYLENOL) 500 MG tablet Take 1,000 mg by mouth every 6 (six) hours as needed for mild pain.   [name] albuterol (VENTOLIN HFA) 108 (90 Base) MCG/ACT inhaler TAKE 2 PUFFS BY MOUTH EVERY 6 HOURS AS NEEDED FOR WHEEZE OR SHORTNESS OF BREATH  . Ascorbic Acid (VITAMIN C) 1000 MG tablet Take 1,000 mg by mouth daily.  . calcium-vitamin D (OSCAL WITH D) 500-200 MG-UNIT tablet Take 1 tablet by mouth daily with breakfast.  . Cholecalciferol (VITAMIN D3) 50 MCG (2000 UT) capsule Take 2,000 Units by mouth daily.  . cyanocobalamin 2000 MCG tablet Take 2,500 mcg  by mouth daily.   . fluticasone (FLONASE) 50 MCG/ACT nasal spray Place 2 sprays into both nostrils daily as needed for allergies.   . furosemide (LASIX) 40 MG tablet Take 1 tablet (40 mg total) by mouth daily.  [name] losartan (COZAAR) 50 MG tablet Take 1 tablet (50 mg total) by mouth daily.  . meclizine (ANTIVERT) 25 MG tablet Take 25 mg by mouth 3 (three) times daily as needed for dizziness.  [name] MEGARED OMEGA-3 KRILL OIL PO Take 1 tablet by mouth daily. 4 in 1  . meloxicam (MOBIC) 7.5 MG tablet Take 7.5 mg by mouth daily as needed for pain.  . Multiple Vitamins-Minerals (MULTIVITAMIN GUMMIES ADULT) CHEW Chew 1 tablet by mouth daily. Vitafurion  . omeprazole (PRILOSEC) 40 MG capsule TAKE 1 CAPSULE BY MOUTH EVERY DAY  . Polyethyl Glycol-Propyl Glycol (SYSTANE) 0.4-0.3 % SOLN Place 1 drop into both eyes daily as needed (Dry eye).  . pravastatin (PRAVACHOL) 40 MG tablet TAKE 1 TABLET BY MOUTH EVERY DAY    Allergies:   Patient has no known allergies.  Social History   Socioeconomic History  . Marital status: Married    Spouse name: Not on file  . Number of children: Not on file  . Years of education: Not on file  . Highest education level: Not on file  Occupational History  . Not on file  Tobacco Use  . Smoking status: Never Smoker  . Smokeless tobacco: Never Used  Vaping Use  . Vaping Use: Never used  Substance and Sexual Activity  . Alcohol use: No  . Drug use: No  . Sexual activity: Never  Other Topics Concern  . Not on file  Social History Narrative   ** Merged History Encounter **       Lives in [place]. Works as Psychiatrist.   Social Determinants of Health   Financial Resource Strain:   . Difficulty of Paying Living Expenses: Not on file  Food Insecurity:   . Worried About Charity fundraiser in the Last Year: Not on file  . Ran Out of Food in the Last Year: Not on file  Transportation Needs:   . Lack of Transportation (Medical): Not on file  . Lack of  Transportation (Non-Medical): Not on file  Physical Activity:   . Days of Exercise per Week: Not on file  . Minutes of Exercise per Session: Not on file  Stress:   . Feeling of Stress : Not on file  Social Connections:   . Frequency of Communication with Friends and Family: Not on file  . Frequency of Social Gatherings with Friends and Family: Not on file  . Attends Religious Services: Not on file  . Active Member of Clubs or Organizations: Not on file  . Attends Archivist Meetings: Not on file  . Marital Status: Not on file     Family History:  The patient's family history includes Arthritis in her brother; Cholelithiasis in her mother; Diabetes in her mother and sister; Heart murmur in her sister; Hypertension in her sister. There is no history of Breast cancer.  ROS:   Review of Systems  Constitutional: Positive for malaise/fatigue. Negative for chills, diaphoresis, fever and weight loss.       Decreased appetite  HENT: Negative for congestion.   Eyes: Negative for discharge and redness.  Respiratory: Negative for cough, sputum production, shortness of breath and wheezing.   Cardiovascular: Negative for chest pain, palpitations, orthopnea, claudication, leg swelling and PND.  Gastrointestinal: Negative for abdominal pain, heartburn, nausea and vomiting.  Musculoskeletal: Negative for falls and myalgias.  Skin: Negative for rash.  Neurological: Positive for dizziness and weakness. Negative for tingling, tremors, sensory change, speech change, focal weakness and loss of consciousness.  Endo/Heme/Allergies: Does not bruise/bleed easily.  Psychiatric/Behavioral: Negative for substance abuse. The patient is not nervous/anxious.   All other systems reviewed and are negative.    EKGs/Labs/Other Studies Reviewed:    Studies reviewed were summarized above. The additional studies were reviewed today:  2D echo 08/2019: 1. Left ventricular ejection fraction, by estimation,  is 30 to 35%. The  left ventricle has moderately decreased function. The left ventricle  demonstrates global hypokinesis with severe hypokinesis of teh anterior,  anteroseptal wall.  2. Right ventricular systolic function is normal. The right ventricular  size is normal. There is moderately elevated pulmonary artery systolic  pressure. The estimated right ventricular systolic pressure is 67.3 mmHg.  3. Mild mitral valve regurgitation.  4. Tricuspid valve regurgitation is moderate.  5. The inferior vena cava is normal in size  with greater than 50%  respiratory variability, suggesting right atrial pressure of 3 mmHg.   Comparison(s): EF 50-55%. __________  [facility] 09/2019: Conclusions: 1. No angiographically significant coronary artery disease. 2. Normal left heart filling pressures. 3. Upper normal to mildly elevated right heart and pulmonary artery pressures. 4. Normal Fick cardiac output/index.  Recommendations: 1. Optimize goal-directed medical therapy for management of nonischemic cardiomyopathy. We will increase losartan to 25 mg daily. BMP should be repeated in approximately 2 weeks to ensure stable renal function and potassium. 2. Primary prevention of coronary artery disease. __________  Limited 2D echo 12/2019: 1. Left ventricular ejection fraction, by estimation, is 35 to 40%. The  left ventricle has moderately decreased function. The left ventricle has  no regional wall motion abnormalities. There is severe hypokinesis of the  left ventricular, anteroseptal  wall and anterior wall.  2. Right ventricular systolic function is normal. The right ventricular  size is normal. There is normal pulmonary artery systolic pressure.  3. The mitral valve is normal in structure. No evidence of mitral valve  regurgitation. No evidence of mitral stenosis.  4. The aortic valve is normal in structure. Aortic valve regurgitation is  not visualized. No aortic stenosis is  present.   EKG:  EKG is not ordered today.    Recent Labs: 01/20/2020: ALT 16; BUN 16; Creatinine, Ser 0.76; Hemoglobin 12.5; Platelets 180; Potassium 4.1; Sodium 139  Recent Lipid Panel    Component Value Date/Time   CHOL 142 12/17/2018 0841   TRIG 49.0 12/17/2018 0841   HDL 61.40 12/17/2018 0841   CHOLHDL 2 12/17/2018 0841   VLDL 9.8 12/17/2018 0841   LDLCALC 71 12/17/2018 0841    PHYSICAL EXAM:    VS:  BP 94/66 (BP Location: Right Arm, Patient Position: Sitting, Cuff Size: Large)   Pulse 76   Ht 5\' 7"  (1.702 m)   Wt 241 lb 6 oz (109.5 kg)   SpO2 94%   BMI 37.80 kg/m   BMI: Body mass index is 37.8 kg/m.  Physical Exam Constitutional:      Appearance: She is well-developed.  HENT:     Head: Normocephalic and atraumatic.  Eyes:     General:        Right eye: No discharge.        Left eye: No discharge.  Neck:     Vascular: No JVD.  Cardiovascular:     Rate and Rhythm: Normal rate and regular rhythm.     Pulses: No midsystolic click and no opening snap.          Posterior tibial pulses are 2+ on the right side and 2+ on the left side.     Heart sounds: Normal heart sounds, S1 normal and S2 normal. Heart sounds not distant. No murmur heard.  No friction rub.  Pulmonary:     Effort: Pulmonary effort is normal. No respiratory distress.     Breath sounds: Normal breath sounds. No decreased breath sounds, wheezing or rales.  Chest:     Chest wall: No tenderness.  Abdominal:     General: There is no distension.     Palpations: Abdomen is soft.     Tenderness: There is no abdominal tenderness.  Musculoskeletal:     Cervical back: Normal range of motion.  Skin:    General: Skin is warm and dry.     Nails: There is no clubbing.  Neurological:     Mental Status: She is alert and oriented to person, place,  and time.  Psychiatric:        Speech: Speech normal.        Behavior: Behavior normal.        Thought Content: Thought content normal.        Judgment:  Judgment normal.     Wt Readings from Last 3 Encounters:  03/09/20 241 lb 6 oz (109.5 kg)  01/20/20 244 lb 8 oz (110.9 kg)  12/30/19 (!) 242 lb 4 oz (109.9 kg)     ASSESSMENT & PLAN:   1. HFrEF secondary to NICM: She appears grossly euvolemic and well compensated with continued stable fatigue and improved dyspnea with NYHA class II-III symptoms.  Escalation of GDMT has been limited by intolerance to bisoprolol secondary to profound fatigue, escalation of alternative beta-blocker secondary to bradycardic heart rates or relative hypotension.  We have not been able to transition her from losartan to [facility] or add spironolactone given relative hypotension.  BP is soft today, likely in the setting of poor p.o. intake recently.  Decrease losartan to 25 mg daily.  Historically, she takes Lasix 40 mg 3 days/week as she indicates she is unable to take this medicine when she is at work.  With relative hypotension and poor p.o. intake at this time current dosing is likely acceptable.  Recent renal function stable.  Recommend she eat regular meals and adequately hydrate.  She will be seen by EP later this month for consideration of CRT with persistent underlying cardiomyopathy despite maximally tolerated GDMT and underlying LBBB.  CHF education.  2. HLD: LDL 71 form 12/2018 with normal LFT in 01/2020.  Tolerating pravastatin, followed by PCP.  3. Hypotension: Likely exacerbated by poor p.o. intake.  Decrease losartan as above.  Disposition: F/u with Dr. Saunders Revel or an APP in 2 months.   Medication Adjustments/Labs and Tests Ordered: Current medicines are reviewed at length with the patient today.  Concerns regarding medicines are outlined above. Medication changes, Labs and Tests ordered today are summarized above and listed in the Patient Instructions accessible in Encounters.   Signed, Christell Faith, PA-C 03/09/2020 2:27 PM     [address], [place]  [phone]

## 2020-03-09 ENCOUNTER — Other Ambulatory Visit: Payer: Self-pay

## 2020-03-09 ENCOUNTER — Ambulatory Visit (INDEPENDENT_AMBULATORY_CARE_PROVIDER_SITE_OTHER): Payer: Medicare HMO | Admitting: Physician Assistant

## 2020-03-09 ENCOUNTER — Encounter: Payer: Self-pay | Admitting: Physician Assistant

## 2020-03-09 VITALS — BP 94/66 | HR 76 | Ht 67.0 in | Wt 241.4 lb

## 2020-03-09 DIAGNOSIS — I447 Left bundle-branch block, unspecified: Secondary | ICD-10-CM | POA: Diagnosis not present

## 2020-03-09 DIAGNOSIS — I428 Other cardiomyopathies: Secondary | ICD-10-CM

## 2020-03-09 DIAGNOSIS — E785 Hyperlipidemia, unspecified: Secondary | ICD-10-CM | POA: Diagnosis not present

## 2020-03-09 DIAGNOSIS — I5020 Unspecified systolic (congestive) heart failure: Secondary | ICD-10-CM | POA: Diagnosis not present

## 2020-03-09 DIAGNOSIS — I959 Hypotension, unspecified: Secondary | ICD-10-CM | POA: Diagnosis not present

## 2020-03-09 MED ORDER — LOSARTAN POTASSIUM 25 MG PO TABS: 25.0000 mg | ORAL_TABLET | Freq: Every day | ORAL | 1 refills | Status: DC

## 2020-03-09 NOTE — Patient Instructions (Signed)
Medication Instructions:  Your physician has recommended you make the following change in your medication:   DECREASE Losartan to 25 mg daily. An Rx has been sent to your pharmacy.  *If you need a refill on your cardiac medications before your next appointment, please call your pharmacy*   Lab Work: None ordered If you have labs (blood work) drawn today and your tests are completely normal, you will receive your results only by: [name] MyChart Message (if you have MyChart) OR . A paper copy in the mail If you have any lab test that is abnormal or we need to change your treatment, we will call you to review the results.   Testing/Procedures: None ordered   Follow-Up: At [facility], you and your health needs are our priority.  As part of our continuing mission to provide you with exceptional heart care, we have created designated Provider Care Teams.  These Care Teams include your primary Cardiologist (physician) and Advanced Practice Providers (APPs -  Physician Assistants and Nurse Practitioners) who all work together to provide you with the care you need, when you need it.  We recommend signing up for the patient portal called "MyChart".  Sign up information is provided on this After Visit Summary.  MyChart is used to connect with patients for Virtual Visits (Telemedicine).  Patients are able to view lab/test results, encounter notes, upcoming appointments, etc.  Non-urgent messages can be sent to your provider as well.   To learn more about what you can do with MyChart, go to [email or web address].    Your next appointment:   2 month(s)  The format for your next appointment:   In Person  Provider:   You may see Nelva Bush, MD  or one of the following Advanced Practice Providers on your designated Care Team:    Murray Hodgkins, NP  Christell Faith, PA-C  Marrianne Mood, PA-C  Cadence Kathlen Mody, [place]    Other Instructions N/A

## 2020-03-28 ENCOUNTER — Other Ambulatory Visit: Payer: Self-pay | Admitting: Internal Medicine

## 2020-03-28 NOTE — Progress Notes (Signed)
ELECTROPHYSIOLOGY CONSULT NOTE  Patient ID: Michelle [name], MRN: [id number], DOB/AGE: 10/12/1940 79 y.o. Admit date: (Not on file) Date of Consult: 03/29/2020  Primary Physician: Burnard Hawthorne, FNP Primary Cardiologist: CE  Michelle [name] is a 79 y.o. female who is being seen today for the evaluation of ICD at the request of CE/RD.    HPI Michelle [name] is a 79 y.o. female with a relatively  Newly identified NICM and class 3 CHF referred for consideration of CRT  Dyspnea on exertion at 50 ft, some orthopnea and peripheral edema No palps or syncope  Modest BP has limited uptitration of GDMT for cardiomyopathy.  Reluctant on arrival to consider device therapy--"my heart is fine"  No fhx, EtoH  DATE TEST EF        7/20 Echo  50-55%   3/21 Echo   30-35 %   4/21 LHC   No obstruc CAD  7/21 Echo  30-35%    Date Cr K Hgb  8/21 0.76 4.1 12.5            Past Medical History:  Diagnosis Date  . Arthritis   . Collagen vascular disease ([place])   . GERD (gastroesophageal reflux disease)   . Headache(784.0)   . History of methotrexate therapy   . Hyperlipidemia    hx  . Lymphadenopathy of head and neck 01/2015   see on Thyroid ultrasound  . Lymphoma, mantle cell ([place]) 06/01/2015   bx of lymph node in right breast/Stage IV Mantle Cell Lymphoma  . Personal history of chemotherapy   . Rheumatoid arthritis [facility])       Surgical History:  Past Surgical History:  Procedure Laterality Date  . CARDIAC CATHETERIZATION  09/2004   [facility]; EF 60%  . CARDIAC CATHETERIZATION  08/2004   [facility]  . IR FLUORO GUIDED NEEDLE PLC ASPIRATION/INJECTION LOC  06/18/2018  . PERIPHERAL VASCULAR CATHETERIZATION N/A 07/04/2015   Procedure: [name] Insertion;  Surgeon: [name], MD;  Location: [place] [facility];  Service: Cardiovascular;  Laterality: N/A;  . PORTA CATH REMOVAL N/A 06/23/2018   Procedure: PORTA CATH REMOVAL;  Surgeon: [name], MD;  Location: [place] [facility];  Service: Cardiovascular;  Laterality: N/A;  . RIGHT/LEFT HEART CATH AND CORONARY ANGIOGRAPHY Bilateral 09/20/2019   Procedure: RIGHT/LEFT HEART CATH AND CORONARY ANGIOGRAPHY;  Surgeon: Nelva Bush, MD;  Location: [place] [facility];  Service: Cardiovascular;  Laterality: Bilateral;     Home Meds: Current Meds  Medication Sig  . acetaminophen (TYLENOL) 500 MG tablet Take 1,000 mg by mouth every 6 (six) hours as needed for mild pain.   [name] albuterol (VENTOLIN HFA) 108 (90 Base) MCG/ACT inhaler TAKE 2 PUFFS BY MOUTH EVERY 6 HOURS AS NEEDED FOR WHEEZE OR SHORTNESS OF BREATH  . Ascorbic Acid (VITAMIN C) 1000 MG tablet Take 1,000 mg by mouth daily.  . Calcium Carbonate-Vit D-Min (CALTRATE 600+D PLUS) 600-800 MG-UNIT CHEW Chew 1 tablet by mouth 2 (two) times daily. (Patient taking differently: Chew 1 tablet by mouth daily. )  . calcium-vitamin D (OSCAL WITH D) 500-200 MG-UNIT tablet Take 1 tablet by mouth daily with breakfast.  . Cholecalciferol (VITAMIN D3) 50 MCG (2000 UT) capsule Take 2,000 Units by mouth daily.  . cyanocobalamin 2000 MCG tablet Take 2,500 mcg by mouth daily.   . fluticasone (FLONASE) 50 MCG/ACT nasal spray Place 2 sprays into both nostrils daily as needed for allergies.   . furosemide (LASIX)  40 MG tablet TAKE 1 TABLET BY MOUTH EVERY DAY  . losartan (COZAAR) 25 MG tablet Take 1 tablet (25 mg total) by mouth daily.  . meclizine (ANTIVERT) 25 MG tablet Take 25 mg by mouth 3 (three) times daily as needed for dizziness.  [name] MEGARED OMEGA-3 KRILL OIL PO Take 1 tablet by mouth daily. 4 in 1  . meloxicam (MOBIC) 7.5 MG tablet Take 7.5 mg by mouth daily as needed for pain.  . Multiple Vitamins-Minerals (MULTIVITAMIN GUMMIES ADULT) CHEW Chew 1 tablet by mouth daily. Vitafurion  . omeprazole (PRILOSEC) 40 MG capsule TAKE 1 CAPSULE BY MOUTH EVERY DAY  . Polyethyl Glycol-Propyl Glycol (SYSTANE) 0.4-0.3 % SOLN Place 1 drop into both eyes daily as needed (Dry eye).  . pravastatin  (PRAVACHOL) 40 MG tablet TAKE 1 TABLET BY MOUTH EVERY DAY    Allergies: No Known Allergies  Social History   Socioeconomic History  . Marital status: Married    Spouse name: Not on file  . Number of children: Not on file  . Years of education: Not on file  . Highest education level: Not on file  Occupational History  . Not on file  Tobacco Use  . Smoking status: Never Smoker  . Smokeless tobacco: Never Used  Vaping Use  . Vaping Use: Never used  Substance and Sexual Activity  . Alcohol use: No  . Drug use: No  . Sexual activity: Never  Other Topics Concern  . Not on file  Social History Narrative   ** Merged History Encounter **       Lives in [place]. Works as Psychiatrist.   Social Determinants of Health   Financial Resource Strain:   . Difficulty of Paying Living Expenses: Not on file  Food Insecurity:   . Worried About Charity fundraiser in the Last Year: Not on file  . Ran Out of Food in the Last Year: Not on file  Transportation Needs:   . Lack of Transportation (Medical): Not on file  . Lack of Transportation (Non-Medical): Not on file  Physical Activity:   . Days of Exercise per Week: Not on file  . Minutes of Exercise per Session: Not on file  Stress:   . Feeling of Stress : Not on file  Social Connections:   . Frequency of Communication with Friends and Family: Not on file  . Frequency of Social Gatherings with Friends and Family: Not on file  . Attends Religious Services: Not on file  . Active Member of Clubs or Organizations: Not on file  . Attends Archivist Meetings: Not on file  . Marital Status: Not on file  Intimate Partner Violence:   . Fear of Current or Ex-Partner: Not on file  . Emotionally Abused: Not on file  . Physically Abused: Not on file  . Sexually Abused: Not on file     Family History  Problem Relation Age of Onset  . Diabetes Mother   . Cholelithiasis Mother   . Hypertension Sister   . Diabetes Sister   .  Heart murmur Sister   . Arthritis Brother   . Breast cancer Neg Hx      ROS:  Please see the history of present illness.     All other systems reviewed and negative.    Physical Exam:  Blood pressure 118/62, pulse 64, height 5\' 5"  (1.651 m), weight 253 lb 4 oz (114.9 kg), SpO2 99 %. General: Well developed, Morbidly obese  female in  no acute distress. Head: Normocephalic, atraumatic, sclera non-icteric, no xanthomas, nares are without discharge. EENT: normal  Lymph Nodes:  none Neck: Negative for carotid bruits  Back:without scoliosis kyphosis  Lungs: Clear bilaterally to auscultation without wheezes, rales, or rhonchi. Breathing is unlabored. Heart: RRR with S1 S2. No  murmur . No rubs, or gallops appreciated. Abdomen: Soft, non-tender, non-distended with normoactive bowel sounds. No hepatomegaly. No rebound/guarding. No obvious abdominal masses. Msk:  Strength and tone appear normal for age. Extremities: No clubbing or cyanosis. tr edema.  Distal pedal pulses are 2+ and equal bilaterally. Skin: Warm and Dry Neuro: Alert and oriented X 3. CN III-XII intact Grossly normal sensory and motor function . Psych:  Responds to questions appropriately with a normal affect.      Labs: Cardiac Enzymes No results for input(s): CKTOTAL, CKMB, TROPONINI in the last 72 hours. CBC Lab Results  Component Value Date   WBC 5.4 01/20/2020   HGB 12.5 01/20/2020   HCT 37.8 01/20/2020   MCV 89.8 01/20/2020   PLT 180 01/20/2020   PROTIME: No results for input(s): LABPROT, INR in the last 72 hours. Chemistry No results for input(s): NA, K, CL, CO2, BUN, CREATININE, CALCIUM, PROT, BILITOT, ALKPHOS, ALT, AST, GLUCOSE in the last 168 hours.  Invalid input(s): LABALBU Lipids Lab Results  Component Value Date   CHOL 142 12/17/2018   HDL 61.40 12/17/2018   LDLCALC 71 12/17/2018   TRIG 49.0 12/17/2018   BNP No results found for: PROBNP Thyroid Function Tests: No results for input(s): TSH,  T4TOTAL, T3FREE, THYROIDAB in the last 72 hours.  Invalid input(s): FREET3 Miscellaneous No results found for: DDIMER  Radiology/Studies:  No results found.  EKG: Sinus at 61 Interval 17/14/45 Axis left -46 Left bundle branch block  10/21 Sinus @ 64 13/15/45 Assessment and Plan:  Nonischemic cardiomyopathy  Left bundle branch block QRS duration 140-150 ms  Congestive heart failure-class 3b  Morbidly obese   Patient is reasonably considered for CRT given left bundle branch block in the 150 ms range and nonischemic cardiomyopathy.  CRT benefit is not mitigated by age as is the benefit of ICD.  I would be inclined towards CRT-P as opposed to CRT-D in the event that she were agreeable to device implantation which is not at all clear.  Her medications have been limited by hypotension and bradycardia and concerns thereby.  Blood pressure is okay today we will begin her on spironolactone at 12.5 mg.  Continue this in addition to her losartan; will check a metabolic profile in a couple of weeks and then have her come back to see Thurmond Butts done again in about 4 weeks.  At that time, we will try and begin her on carvedilol 3.125 mg twice daily.  I would avoid Entresto initially.  The addition of Aldactone will be helping somewhat with her fluid status as well  I will see her again in 6-8 weeks time as she ponders and prays as to whether she is open to the idea of device implantation for her heart failure.    PWP      Virl Axe

## 2020-03-29 ENCOUNTER — Encounter: Payer: Self-pay | Admitting: Internal Medicine

## 2020-03-29 ENCOUNTER — Other Ambulatory Visit: Payer: Self-pay

## 2020-03-29 ENCOUNTER — Ambulatory Visit: Payer: Medicare HMO | Admitting: Internal Medicine

## 2020-03-29 VITALS — BP 118/62 | HR 64 | Ht 65.0 in | Wt 253.2 lb

## 2020-03-29 DIAGNOSIS — Z79899 Other long term (current) drug therapy: Secondary | ICD-10-CM | POA: Diagnosis not present

## 2020-03-29 DIAGNOSIS — I428 Other cardiomyopathies: Secondary | ICD-10-CM

## 2020-03-29 DIAGNOSIS — I447 Left bundle-branch block, unspecified: Secondary | ICD-10-CM

## 2020-03-29 DIAGNOSIS — I5020 Unspecified systolic (congestive) heart failure: Secondary | ICD-10-CM

## 2020-03-29 MED ORDER — SPIRONOLACTONE 25 MG PO TABS: 12.5000 mg | ORAL_TABLET | Freq: Every day | ORAL | 1 refills | Status: DC

## 2020-03-29 NOTE — Patient Instructions (Signed)
Medication Instructions:  - Your physician has recommended you make the following change in your medication:   1) START aldactone (spironolactone) 25 mg- take 0.5 tablet (12.5 mg) by mouth once daily   *If you need a refill on your cardiac medications before your next appointment, please call your pharmacy*   Lab Work: - Your physician recommends that you return for lab work in: 2 weeks (around 04/12/20)- BMP  - come to the [place] entrance at [facility] - 1st desk on the right to check in, just past the screening table - Lab hours: Monday- Friday (7:30 am- 5:30 pm)  If you have labs (blood work) drawn today and your tests are completely normal, you will receive your results only by: [name] MyChart Message (if you have MyChart) OR . A paper copy in the mail If you have any lab test that is abnormal or we need to change your treatment, we will call you to review the results.   Testing/Procedures: - none ordered   Follow-Up: At [facility], you and your health needs are our priority.  As part of our continuing mission to provide you with exceptional heart care, we have created designated Provider Care Teams.  These Care Teams include your primary Cardiologist (physician) and Advanced Practice Providers (APPs -  Physician Assistants and Nurse Practitioners) who all work together to provide you with the care you need, when you need it.  We recommend signing up for the patient portal called "MyChart".  Sign up information is provided on this After Visit Summary.  MyChart is used to connect with patients for Virtual Visits (Telemedicine).  Patients are able to view lab/test results, encounter notes, upcoming appointments, etc.  Non-urgent messages can be sent to your provider as well.   To learn more about what you can do with MyChart, go to [email or web address].    Your next appointment:   1) 4 weeks with Thurmond Butts   2) 8 weeks with Dr. Caryl Comes  The format for your next appointment:   In  Person  Provider:   As above   Other Instructions  Aldactone (Spironolactone) Oral Tablets What is this medicine? SPIRONOLACTONE (speer on oh LAK tone) is a diuretic. It helps you make more urine and to lose excess water from your body. This medicine is used to treat high blood pressure, and edema or swelling from heart, kidney, or liver disease. It is also used to treat patients who make too much aldosterone or have low potassium. This medicine may be used for other purposes; ask your health care provider or pharmacist if you have questions. COMMON BRAND NAME(S): Aldactone What should I tell my health care provider before I take this medicine? They need to know if you have any of these conditions:  high blood level of potassium  kidney disease or trouble making urine  liver disease  an unusual or allergic reaction to spironolactone, other medicines, foods, dyes, or preservatives  pregnant or trying to get pregnant  breast-feeding How should I use this medicine? Take this drug by mouth. Take it as directed on the prescription label at the same time every day. You can take it with or without food. You should always take it the same way. Keep taking it unless your health care provider tells you to stop. Talk to your health care provider about the use of this drug in children. Special care may be needed. Overdosage: If you think you have taken too much of this medicine contact a poison  control center or emergency room at once. NOTE: This medicine is only for you. Do not share this medicine with others. What if I miss a dose? If you miss a dose, take it as soon as you can. If it is almost time for your next dose, take only that dose. Do not take double or extra doses. What may interact with this medicine? Do not take this medicine with any of the following medications:  cidofovir  eplerenone  tranylcypromine This medicine may also interact with the following  medications:  aspirin  certain medicines for blood pressure or heart disease like benazepril, lisinopril, losartan, valsartan  certain medicines that treat or prevent blood clots like heparin and enoxaparin  cholestyramine  cyclosporine  digoxin  lithium  medicines that relax muscles for surgery  NSAIDs, medicines for pain and inflammation, like ibuprofen or naproxen  other diuretics  potassium supplements  steroid medicines like prednisone or cortisone  trimethoprim This list may not describe all possible interactions. Give your health care provider a list of all the medicines, herbs, non-prescription drugs, or dietary supplements you use. Also tell them if you smoke, drink alcohol, or use illegal drugs. Some items may interact with your medicine. What should I watch for while using this medicine? Visit your doctor or health care professional for regular checks on your progress. Check your blood pressure as directed. Ask your doctor what your blood pressure should be, and when you should contact them. You may need to be on a special diet while taking this medicine. Ask your doctor. Also, ask how many glasses of fluid you need to drink a day. You must not get dehydrated. This medicine may make you feel confused, dizzy or lightheaded. Drinking alcohol and taking some medicines can make this worse. Do not drive, use machinery, or do anything that needs mental alertness until you know how this medicine affects you. Do not sit or stand up quickly. What side effects may I notice from receiving this medicine? Side effects that you should report to your doctor or health care professional as soon as possible:  allergic reactions such as skin rash or itching, hives, swelling of the lips, mouth, tongue, or throat  black or tarry stools  fast, irregular heartbeat  fever  muscle pain, cramps  numbness, tingling in hands or feet  trouble breathing  trouble passing urine  unusual  bleeding  unusually weak or tired Side effects that usually do not require medical attention (report to your doctor or health care professional if they continue or are bothersome):  change in voice or hair growth  confusion  dizzy, drowsy  dry mouth, increased thirst  enlarged or tender breasts  headache  irregular menstrual periods  sexual difficulty, unable to have an erection  stomach upset This list may not describe all possible side effects. Call your doctor for medical advice about side effects. You may report side effects to FDA at 1-800-FDA-1088. Where should I keep my medicine? Keep out of the reach of children and pets. Store at room temperature between 20 and 25 degrees C (68 and 77 degrees F). Throw away any unused drug after the expiration date. NOTE: This sheet is a summary. It may not cover all possible information. If you have questions about this medicine, talk to your doctor, pharmacist, or health care provider.  2020 Elsevier/Gold Standard (2019-01-11 12:38:34)

## 2020-03-30 ENCOUNTER — Telehealth: Payer: Self-pay | Admitting: Internal Medicine

## 2020-03-30 NOTE — Telephone Encounter (Signed)
Please call to discuss Spironolactone doseage.

## 2020-03-30 NOTE — Telephone Encounter (Signed)
Spoke with patient and clarified that she is supposed to be taking 0.5 tab (12.5mg ) of her Spiranolactone 25 mg tablet. Patient just picked up from the pharmacy and will start taking today. She was concerned as the tablet is very small and is not scored. She does have a pill cutter and will use that.  Patient was grateful for the callback.

## 2020-04-12 ENCOUNTER — Other Ambulatory Visit
Admission: RE | Admit: 2020-04-12 | Discharge: 2020-04-12 | Disposition: A | Discharge status: Home / Self Care | Payer: Medicare HMO | Attending: Internal Medicine | Admitting: Internal Medicine

## 2020-04-12 DIAGNOSIS — I428 Other cardiomyopathies: Secondary | ICD-10-CM | POA: Insufficient documentation

## 2020-04-12 DIAGNOSIS — Z79899 Other long term (current) drug therapy: Secondary | ICD-10-CM | POA: Diagnosis not present

## 2020-04-12 DIAGNOSIS — I5020 Unspecified systolic (congestive) heart failure: Secondary | ICD-10-CM | POA: Diagnosis not present

## 2020-04-12 LAB — BASIC METABOLIC PANEL WITH GFR
Anion gap: 7 (ref 5–15)
BUN: 16 mg/dL (ref 8–23)
CO2: 27 mmol/L (ref 22–32)
Calcium: 9.1 mg/dL (ref 8.9–10.3)
Chloride: 104 mmol/L (ref 98–111)
Creatinine, Ser: 0.75 mg/dL (ref 0.44–1.00)
GFR, Estimated: >60 mL/min
Glucose, Bld: 100 mg/dL — ABNORMAL HIGH (ref 70–99)
Potassium: 3.9 mmol/L (ref 3.5–5.1)
Sodium: 138 mmol/L (ref 135–145)

## 2020-04-17 ENCOUNTER — Other Ambulatory Visit: Payer: Self-pay

## 2020-04-17 ENCOUNTER — Ambulatory Visit
Admission: RE | Admit: 2020-04-17 | Discharge: 2020-04-17 | Disposition: A | Discharge status: Home / Self Care | Payer: Medicare HMO | Source: Ambulatory Visit | Attending: Internal Medicine | Admitting: Internal Medicine

## 2020-04-17 DIAGNOSIS — C8311 Mantle cell lymphoma, lymph nodes of head, face, and neck: Secondary | ICD-10-CM

## 2020-04-17 DIAGNOSIS — K5730 Diverticulosis of large intestine without perforation or abscess without bleeding: Secondary | ICD-10-CM | POA: Diagnosis not present

## 2020-04-17 DIAGNOSIS — K3189 Other diseases of stomach and duodenum: Secondary | ICD-10-CM | POA: Diagnosis not present

## 2020-04-17 DIAGNOSIS — K8020 Calculus of gallbladder without cholecystitis without obstruction: Secondary | ICD-10-CM | POA: Insufficient documentation

## 2020-04-17 DIAGNOSIS — J984 Other disorders of lung: Secondary | ICD-10-CM | POA: Diagnosis not present

## 2020-04-17 DIAGNOSIS — I700 Atherosclerosis of aorta: Secondary | ICD-10-CM | POA: Insufficient documentation

## 2020-04-17 DIAGNOSIS — J9811 Atelectasis: Secondary | ICD-10-CM | POA: Diagnosis not present

## 2020-04-17 DIAGNOSIS — C8310 Mantle cell lymphoma, unspecified site: Secondary | ICD-10-CM | POA: Diagnosis not present

## 2020-04-17 DIAGNOSIS — J8489 Other specified interstitial pulmonary diseases: Secondary | ICD-10-CM | POA: Diagnosis not present

## 2020-04-17 MED ORDER — IOHEXOL 300 MG/ML  SOLN
100.0000 mL | Freq: Once | INTRAMUSCULAR | Status: AC | PRN
  Administered 2020-04-17: 100 mL via INTRAVENOUS

## 2020-04-20 ENCOUNTER — Encounter: Payer: Self-pay | Admitting: Internal Medicine

## 2020-04-20 ENCOUNTER — Inpatient Hospital Stay: Payer: Medicare HMO | Attending: Internal Medicine

## 2020-04-20 ENCOUNTER — Inpatient Hospital Stay: Payer: Medicare HMO | Admitting: Internal Medicine

## 2020-04-20 ENCOUNTER — Other Ambulatory Visit: Payer: Self-pay

## 2020-04-20 DIAGNOSIS — C8311 Mantle cell lymphoma, lymph nodes of head, face, and neck: Secondary | ICD-10-CM | POA: Diagnosis not present

## 2020-04-20 DIAGNOSIS — C8318 Mantle cell lymphoma, lymph nodes of multiple sites: Secondary | ICD-10-CM | POA: Insufficient documentation

## 2020-04-20 DIAGNOSIS — I272 Pulmonary hypertension, unspecified: Secondary | ICD-10-CM | POA: Insufficient documentation

## 2020-04-20 LAB — COMPREHENSIVE METABOLIC PANEL
ALT: 21 U/L (ref 0–44)
AST: 27 U/L (ref 15–41)
Albumin: 4.1 g/dL (ref 3.5–5.0)
Alkaline Phosphatase: 84 U/L (ref 38–126)
Anion gap: 9 (ref 5–15)
BUN: 17 mg/dL (ref 8–23)
CO2: 29 mmol/L (ref 22–32)
Calcium: 9.4 mg/dL (ref 8.9–10.3)
Chloride: 102 mmol/L (ref 98–111)
Creatinine, Ser: 0.84 mg/dL (ref 0.44–1.00)
GFR, Estimated: >60 mL/min (ref >60)
Glucose, Bld: 106 mg/dL — ABNORMAL HIGH (ref 70–99)
Potassium: 4 mmol/L (ref 3.5–5.1)
Sodium: 140 mmol/L (ref 135–145)
Total Bilirubin: 0.7 mg/dL (ref 0.3–1.2)
Total Protein: 7.2 g/dL (ref 6.5–8.1)

## 2020-04-20 LAB — CBC WITH DIFFERENTIAL/PLATELET
Abs Immature Granulocytes: 0.03 10*3/uL (ref 0.00–0.07)
Basophils Absolute: 0 10*3/uL (ref 0.0–0.1)
Basophils Relative: 0 %
Eosinophils Absolute: 0.2 10*3/uL (ref 0.0–0.5)
Eosinophils Relative: 2 %
HCT: 44.4 % (ref 36.0–46.0)
Hemoglobin: 14.5 g/dL (ref 12.0–15.0)
Immature Granulocytes: 0 %
Lymphocytes Relative: 20 %
Lymphs Abs: 1.5 10*3/uL (ref 0.7–4.0)
MCH: 29.4 pg (ref 26.0–34.0)
MCHC: 32.7 g/dL (ref 30.0–36.0)
MCV: 90.1 fL (ref 80.0–100.0)
Monocytes Absolute: 0.4 10*3/uL (ref 0.1–1.0)
Monocytes Relative: 5 %
Neutro Abs: 5.4 10*3/uL (ref 1.7–7.7)
Neutrophils Relative %: 73 %
Platelets: 217 10*3/uL (ref 150–400)
RBC: 4.93 MIL/uL (ref 3.87–5.11)
RDW: 14.2 % (ref 11.5–15.5)
WBC: 7.5 10*3/uL (ref 4.0–10.5)
nRBC: 0 % (ref 0.0–0.2)

## 2020-04-20 LAB — LACTATE DEHYDROGENASE: LDH: 190 U/L (ref 98–192)

## 2020-04-20 NOTE — Assessment & Plan Note (Addendum)
#  Mantle cell lymphoma stage IV-currently on surveillance [last Rituxan August 2019]; NOV 17th 2021-  CT scan no evidence of recurrence of lymphoma/no splenomegaly.  Stable except continued mild bil lung base scarring/see below/dilated pulmonary artery.  STABLE.   # Chronic arthritis-NSAIDs as needed. STABLE.   # NICMP- [35-40%%- July 28th 2021]-awaiting evaluation with EP-regarding CRT [Dr.End/Dr.Klein]- reviewed the notes/2 d echo. STABLE>   #Pulmonary hypertension/chronic bilateral lung scarring- [s/p pulmonary evaluation]-improved on albuterol.  STABLE  # DISPOSITION: # follow up in 4 months-MD /labs-cbc/cmp/ldh;.B  # I reviewed the blood work- with the patient in detail; also reviewed the imaging independently [as summarized above]; and with the patient in detail.   # 25 minutes face-to-face with the patient discussing the above plan of care; more than 50% of time spent on prognosis/ natural history; counseling and coordination.

## 2020-04-20 NOTE — Progress Notes (Signed)
[place] OFFICE PROGRESS NOTE  Patient Care Team: Burnard Hawthorne, FNP as PCP - General (Family Medicine) End, Harrell Gave, MD as PCP - Cardiology (Cardiology) Jackolyn Confer, MD (Internal Medicine)  Cancer Staging No matching staging information was found for the patient.   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 Feb 9th- START Benda-Ritux with neulasta; Prolonged neutropenia; DISCONT- Benda-Ritux;   # April 13 th 2017- START R-CHOP x1; severe/prolonged neutropenia; PET- CR; BMBx-Neg; Disc R-CHOP  # 26th MAY 2017- Start Rituxan q 52M Main OCT 12th 2017- PET NED.  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  female pleasant patient above history of mantle cell lymphoma currently on surveillance is here for follow-up/is here for follow-up/reveals of the CT scan.   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  Patient denies any weight loss but denies any lumps or bumps.  No fevers.  No night sweats.  Review of Systems  Constitutional: Negative for chills, diaphoresis, fever, malaise/fatigue and weight loss.  HENT: Negative for nosebleeds and sore throat.   Eyes: Negative for double vision.  Respiratory: Negative for hemoptysis, sputum production and wheezing.   Cardiovascular: Negative for chest pain, palpitations, orthopnea and leg swelling.  Gastrointestinal: Negative  for abdominal pain, blood in stool, constipation, diarrhea, heartburn, melena, nausea and vomiting.  Genitourinary: Negative for dysuria, frequency and urgency.  Musculoskeletal: Negative for back pain and joint pain.  Skin: Negative.  Negative for itching and rash.  Neurological: Negative for dizziness, tingling, focal weakness, weakness and headaches.  Endo/Heme/Allergies: Does not bruise/bleed easily.  Psychiatric/Behavioral: Negative for depression. The patient is not nervous/anxious and does not have insomnia.     PAST MEDICAL HISTORY :  Past Medical History:  Diagnosis Date  . Arthritis   . Collagen vascular disease ([place])   . GERD (gastroesophageal reflux disease)   . Headache(784.0)   . History of methotrexate therapy   . Hyperlipidemia    hx  . Lymphadenopathy of head and neck 01/2015   see on Thyroid ultrasound  . Lymphoma, mantle cell ([place]) 06/01/2015   bx of lymph node in right breast/Stage IV Mantle Cell Lymphoma  . Personal history of chemotherapy   . Rheumatoid arthritis ([place])     PAST SURGICAL HISTORY :   Past Surgical History:  Procedure Laterality Date  . CARDIAC CATHETERIZATION  09/2004   [facility]; EF 60%  . CARDIAC CATHETERIZATION  08/2004   [facility]  . IR FLUORO GUIDED NEEDLE PLC ASPIRATION/INJECTION LOC  06/18/2018  . PERIPHERAL VASCULAR CATHETERIZATION N/A 07/04/2015   Procedure: [name] Insertion;  Surgeon: [name], MD;  Location: [facility];  Service: Cardiovascular;  Laterality: N/A;  . PORTA CATH REMOVAL N/A 06/23/2018   Procedure: PORTA CATH REMOVAL;  Surgeon: [name], MD;  Location: [facility];  Service: Cardiovascular;  Laterality: N/A;  . RIGHT/LEFT HEART CATH AND CORONARY ANGIOGRAPHY Bilateral  09/20/2019   Procedure: RIGHT/LEFT HEART CATH AND CORONARY ANGIOGRAPHY;  Surgeon: Nelva Bush, MD;  Location: [facility];  Service: Cardiovascular;  Laterality: Bilateral;    FAMILY HISTORY :   Family History  Problem  Relation Age of Onset  . Diabetes Mother   . Cholelithiasis Mother   . Hypertension Sister   . Diabetes Sister   . Heart murmur Sister   . Arthritis Brother   . Breast cancer Neg Hx     SOCIAL HISTORY:   Social History   Tobacco Use  . Smoking status: Never Smoker  . Smokeless tobacco: Never Used  Vaping Use  . Vaping Use: Never used  Substance Use Topics  . Alcohol use: No  . Drug use: No    ALLERGIES:  has No Known Allergies.  MEDICATIONS:  Current Outpatient Medications  Medication Sig Dispense Refill  . acetaminophen (TYLENOL) 500 MG tablet Take 1,000 mg by mouth every 6 (six) hours as needed for mild pain.     [name] albuterol (VENTOLIN HFA) 108 (90 Base) MCG/ACT inhaler TAKE 2 PUFFS BY MOUTH EVERY 6 HOURS AS NEEDED FOR WHEEZE OR SHORTNESS OF BREATH 18 each 1  . Ascorbic Acid (VITAMIN C) 1000 MG tablet Take 1,000 mg by mouth daily.    . bisoprolol (ZEBETA) 5 MG tablet Take 0.5 tablets by mouth daily.    . Calcium Carbonate-Vit D-Min (CALTRATE 600+D PLUS) 600-800 MG-UNIT CHEW Chew 1 tablet by mouth 2 (two) times daily. (Patient taking differently: Chew 1 tablet by mouth daily. ) 60 tablet 6  . calcium-vitamin D (OSCAL WITH D) 500-200 MG-UNIT tablet Take 1 tablet by mouth daily with breakfast.    . Cholecalciferol (VITAMIN D3) 50 MCG (2000 UT) capsule Take 2,000 Units by mouth daily.    . cyanocobalamin 2000 MCG tablet Take 2,500 mcg by mouth daily.     . fluticasone (FLONASE) 50 MCG/ACT nasal spray Place 2 sprays into both nostrils daily as needed for allergies.   4  . furosemide (LASIX) 40 MG tablet TAKE 1 TABLET BY MOUTH EVERY DAY 90 tablet 1  . losartan (COZAAR) 25 MG tablet Take 1 tablet (25 mg total) by mouth daily. 90 tablet 1  . meclizine (ANTIVERT) 25 MG tablet Take 25 mg by mouth 3 (three) times daily as needed for dizziness.    [name] MEGARED OMEGA-3 KRILL OIL PO Take 1 tablet by mouth daily. 4 in 1    . meloxicam (MOBIC) 7.5 MG tablet Take 7.5 mg by mouth daily as needed  for pain.    . Multiple Vitamins-Minerals (MULTIVITAMIN GUMMIES ADULT) CHEW Chew 1 tablet by mouth daily. Vitafurion    . omeprazole (PRILOSEC) 40 MG capsule TAKE 1 CAPSULE BY MOUTH EVERY DAY 90 capsule 1  . Polyethyl Glycol-Propyl Glycol (SYSTANE) 0.4-0.3 % SOLN Place 1 drop into both eyes daily as needed (Dry eye).    . pravastatin (PRAVACHOL) 40 MG tablet TAKE 1 TABLET BY MOUTH EVERY DAY 90 tablet 1  . spironolactone (ALDACTONE) 25 MG tablet Take 0.5 tablets (12.5 mg total) by mouth daily. 30 tablet 1   No current facility-administered medications for this visit.   Facility-Administered Medications Ordered in Other Visits  Medication Dose Route Frequency Provider Last Rate Last Admin  . heparin lock flush 100 unit/mL  500 Units Intravenous Once Charlaine Dalton R, MD      . sodium chloride flush (NS) 0.9 % injection 10 mL  10 mL Intravenous Once Cammie Sickle, MD      .  sodium chloride flush (NS) 0.9 % injection 10 mL  10 mL Intravenous Once Cammie Sickle, MD      . Tbo-Filgrastim [facility]) injection 480 mcg  480 mcg Subcutaneous Once Cammie Sickle, MD        PHYSICAL EXAMINATION: ECOG PERFORMANCE STATUS: 0 - Asymptomatic  BP 122/71 (BP Location: Right Arm, Patient Position: Sitting)   Pulse 71   Temp (!) 96 F (35.6 C) (Tympanic)   Resp 20   Ht 5\' 5"  (1.651 m)   Wt 254 lb (115.2 kg)   BMI 42.27 kg/m   Filed Weights   04/20/20 1347  Weight: 254 lb (115.2 kg)    Physical Exam Constitutional:      Comments: Obese.  Walking herself with cane.   HENT:     Head: Normocephalic and atraumatic.     Mouth/Throat:     Pharynx: No oropharyngeal exudate.  Eyes:     Pupils: Pupils are equal, round, and reactive to light.  Cardiovascular:     Rate and Rhythm: Normal rate and regular rhythm.  Pulmonary:     Effort: No respiratory distress.     Breath sounds: No wheezing.  Abdominal:     General: Bowel sounds are normal. There is no distension.      Palpations: Abdomen is soft. There is no mass.     Tenderness: There is no abdominal tenderness. There is no guarding or rebound.  Musculoskeletal:        General: No tenderness. Normal range of motion.     Cervical back: Normal range of motion and neck supple.  Skin:    General: Skin is warm.  Neurological:     Mental Status: She is alert and oriented to person, place, and time.  Psychiatric:        Mood and Affect: Affect normal.    LABORATORY DATA:  I have reviewed the data as listed    Component Value Date/Time   NA 140 04/20/2020 1324   NA 145 (H) 09/08/2019 1359   NA 142 09/14/2013 1127   K 4.0 04/20/2020 1324   K 3.5 09/14/2013 1127   CL 102 04/20/2020 1324   CL 110 (H) 09/14/2013 1127   CO2 29 04/20/2020 1324   CO2 29 09/14/2013 1127   GLUCOSE 106 (H) 04/20/2020 1324   GLUCOSE 106 (H) 09/14/2013 1127   BUN 17 04/20/2020 1324   BUN 15 09/08/2019 1359   BUN 8 09/14/2013 1127   CREATININE 0.84 04/20/2020 1324   CREATININE 0.71 09/14/2013 1127   CREATININE 0.68 04/01/2013 1550   CALCIUM 9.4 04/20/2020 1324   CALCIUM 8.6 09/14/2013 1127   PROT 7.2 04/20/2020 1324   PROT 7.6 09/14/2013 1127   ALBUMIN 4.1 04/20/2020 1324   ALBUMIN 3.2 (L) 09/14/2013 1127   AST 27 04/20/2020 1324   AST 22 09/14/2013 1127   ALT 21 04/20/2020 1324   ALT 19 09/14/2013 1127   ALKPHOS 84 04/20/2020 1324   ALKPHOS 76 09/14/2013 1127   BILITOT 0.7 04/20/2020 1324   BILITOT 0.4 09/14/2013 1127   GFRNONAA >60 04/20/2020 1324   GFRNONAA >60 09/14/2013 1127   GFRAA >60 01/20/2020 1303   GFRAA >60 09/14/2013 1127    No results found for: SPEP, UPEP  Lab Results  Component Value Date   WBC 7.5 04/20/2020   NEUTROABS 5.4 04/20/2020   HGB 14.5 04/20/2020   HCT 44.4 04/20/2020   MCV 90.1 04/20/2020   PLT 217 04/20/2020  Chemistry      Component Value Date/Time   NA 140 04/20/2020 1324   NA 145 (H) 09/08/2019 1359   NA 142 09/14/2013 1127   K 4.0 04/20/2020 1324   K 3.5  09/14/2013 1127   CL 102 04/20/2020 1324   CL 110 (H) 09/14/2013 1127   CO2 29 04/20/2020 1324   CO2 29 09/14/2013 1127   BUN 17 04/20/2020 1324   BUN 15 09/08/2019 1359   BUN 8 09/14/2013 1127   CREATININE 0.84 04/20/2020 1324   CREATININE 0.71 09/14/2013 1127   CREATININE 0.68 04/01/2013 1550      Component Value Date/Time   CALCIUM 9.4 04/20/2020 1324   CALCIUM 8.6 09/14/2013 1127   ALKPHOS 84 04/20/2020 1324   ALKPHOS 76 09/14/2013 1127   AST 27 04/20/2020 1324   AST 22 09/14/2013 1127   ALT 21 04/20/2020 1324   ALT 19 09/14/2013 1127   BILITOT 0.7 04/20/2020 1324   BILITOT 0.4 09/14/2013 1127       RADIOGRAPHIC STUDIES: I have personally reviewed the radiological images as listed and agreed with the findings in the report. No results found.   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  Stable except continued mild bil lung base scarring/see below/dilated pulmonary artery.  STABLE.   # Chronic arthritis-NSAIDs as needed. STABLE.   # NICMP- [35-40%%- July 28th 2021]-awaiting evaluation with EP-regarding CRT [Dr.End/Dr.Klein]- reviewed the notes/2 d echo. STABLE>   #Pulmonary hypertension/chronic bilateral lung scarring- [s/p pulmonary evaluation]-improved on albuterol.  STABLE  # DISPOSITION: # follow up in 4 months-MD /labs-cbc/cmp/ldh;.B  # I reviewed the blood work- with the patient in detail; also reviewed the imaging independently [as summarized above]; and with the patient in detail.   # 25 minutes face-to-face with the patient discussing the above plan of care; more than 50% of time spent on prognosis/ natural history; counseling and coordination.      Orders Placed This Encounter  Procedures  . Comprehensive metabolic panel    Standing Status:    Future    Standing Expiration Date:   04/20/2021  . CBC with Differential    Standing Status:   Future    Standing Expiration Date:   04/20/2021  . Lactate dehydrogenase    Standing Status:   Future    Standing Expiration Date:   04/20/2021   All questions were answered. The patient knows to call the clinic with any problems, questions or concerns.      Cammie Sickle, MD 04/22/2020 9:50 AM

## 2020-04-25 NOTE — Progress Notes (Signed)
Cardiology Office Note    Date:  04/30/2020   ID:  [name], [name] 08-26-1940, MRN [id number]  PCP:  Burnard Hawthorne, FNP  Cardiologist:  Nelva Bush, MD  Electrophysiologist:  None   Chief Complaint: Follow-up  History of Present Illness:   Michelle [name] is a 80 y.o. female with history of HFrEF secondary toNICM,rheumatoid arthritis, mantle cell lymphoma,HLD, LBBB, and GERD who presents for follow-up of her cardiomyopathy.  Remote LHC from 08/2004 showed no significant CAD with an LVEF of 60%. Prior echo from 12/2018 showed an EF of 50 to 55%, mild LVH, grade 1 diastolic dysfunction, normal RV systolic function and ventricular cavity size, and mild biatrial enlargement. More recently, echo in 08/2019 showed a new cardiomyopathy with an LVEF of 30 to 35%, global hypokinesis with severe hypokinesis of the anterior and anteroseptal walls, normal RV systolic function and ventricular cavity size, mild pulmonary hypertension, mild mitral regurgitation, moderate tricuspid regurgitation. Diagnostic R/LHC in 09/2019 showed no angiographically significant CAD with normal left heart filling pressures. Upper normal to mildly elevated right heart and pulmonary artery pressures. Normal cardiac output/index. She has been managed with GDMT with symptom improvement. She was seen on 11/21/2019 feeling slightly better than she had at her prior visits. She still had some fatigue and exertional dyspnea with modest activity though this was improving. She was taking furosemide a few days per week as it was difficult for her to use the restroom while working. She was euvolemic with a weight of 243 pounds. She remainedNYHA class III. She was started on bisoprolol 2.5 mg nightly and continued on losartan with recommendation to attempt to take Lasix on a daily basis. We subsequently received a phone call indicating she was not tolerating bisoprolol and in this setting, it was discontinued.  She underwent follow-up limited echo on maximally tolerated GDMT on 12/26/2019, which showed an EF of35 to 40%, severe HK of the anteroseptal and anterior wall, normal RV systolic function and RV cavity size, and no significant valvular abnormality.  She was seen in the office on 12/30/2019 and was doing well from a cardiac perspective.  She continued to feel about the same as she did with her last visit with continued fatigue and exertional dyspnea with mild to modest activity.  She was taking Lasix 40 mg approximately 3 days/week, was adding salt to some of her foods, and drinking over 2 L of liquid per day.  Her weight was stable at 242 pounds.  Since discontinuing bisoprolol she felt like her energy had improved.  Her vital signs precluded escalation of GDMT at that time.  She was referred to EP for consideration of CRT with underlying LBBB with persistent cardiomyopathy on maximally tolerated GDMT.  She was seen in 03/2020 and was euvolemic with escalation of GDMT being precluded by fatigue, bradycardia, and relative hypotension leading to the tapering of losartan. She continued to take Lasix 3 days/week rather than daily.  She was evaluated by EP on 03/29/2020 with recommendation to further escalate GDMT initially.  She comes in accompanied by her husband today and is doing well from a cardiac perspective.  Since she was last seen she notes some improvement in her shortness of breath.  She does feel like she was holding onto some extra fluid when she was last seen in late October with her weight being up 12 pounds when compared to her visit in early October.  With this, she noted some lower extremity swelling, abdominal tension, and  orthopnea.  Her weight is down 4 pounds today when compared to her last clinic visit.  She denies any lower extremity swelling, abdominal distention, or orthopnea.  No chest pain, palpitations, dizziness, presyncope, or syncope.  She is watching her sodium intake though does  continue to drink greater than 2 L of fluid per day.  She is taking her Lasix approximately 3 to 4 days/week.  Otherwise, she is taking losartan and spironolactone on a daily basis without issues.  She did self discontinue pravastatin secondary to myalgias which have improved with discontinuation of this medication.  She continues to think about possible CRT implantation.   Labs independently reviewed: 04/2020 - HGB 14.5, PLT 217, potassium 4.0, BUN 17, SCr 0.84, albumin 4.1, AST/ALT normal 12/2018-TC 142, TG 49, HDL 61, LDL 71, TSH normal    Past Medical History:  Diagnosis Date  . Arthritis   . Collagen vascular disease ([place])   . GERD (gastroesophageal reflux disease)   . Headache(784.0)   . History of methotrexate therapy   . Hyperlipidemia    hx  . Lymphadenopathy of head and neck 01/2015   see on Thyroid ultrasound  . 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 Personal history of chemotherapy   . Rheumatoid arthritis [facility])     Past Surgical History:  Procedure Laterality Date  . CARDIAC CATHETERIZATION  09/2004   [facility]; EF 60%  . CARDIAC CATHETERIZATION  08/2004   [facility]  . IR FLUORO GUIDED NEEDLE PLC ASPIRATION/INJECTION LOC  06/18/2018  . PERIPHERAL VASCULAR CATHETERIZATION N/A 07/04/2015   Procedure: [name] Insertion;  Surgeon: [name], MD;  Location: [place] [facility];  Service: Cardiovascular;  Laterality: N/A;  . PORTA CATH REMOVAL N/A 06/23/2018   Procedure: PORTA CATH REMOVAL;  Surgeon: [name], MD;  Location: [place] [facility];  Service: Cardiovascular;  Laterality: N/A;  . RIGHT/LEFT HEART CATH AND CORONARY ANGIOGRAPHY Bilateral 09/20/2019   Procedure: RIGHT/LEFT HEART CATH AND CORONARY ANGIOGRAPHY;  Surgeon: Nelva Bush, MD;  Location: [place] [facility];  Service: Cardiovascular;  Laterality: Bilateral;    Current Medications: Current Meds  Medication Sig  . acetaminophen  (TYLENOL) 500 MG tablet Take 1,000 mg by mouth every 6 (six) hours as needed for mild pain.   [name] albuterol (VENTOLIN HFA) 108 (90 Base) MCG/ACT inhaler TAKE 2 PUFFS BY MOUTH EVERY 6 HOURS AS NEEDED FOR WHEEZE OR SHORTNESS OF BREATH  . Ascorbic Acid (VITAMIN C) 1000 MG tablet Take 1,000 mg by mouth daily.  . Calcium Carbonate-Vit D-Min (CALTRATE 600+D PLUS) 600-800 MG-UNIT CHEW Chew 1 tablet by mouth 2 (two) times daily.  . calcium-vitamin D (OSCAL WITH D) 500-200 MG-UNIT tablet Take 1 tablet by mouth daily with breakfast.  . Cholecalciferol (VITAMIN D3) 50 MCG (2000 UT) capsule Take 2,000 Units by mouth daily.  . cyanocobalamin 2000 MCG tablet Take 2,500 mcg by mouth daily.   . fluticasone (FLONASE) 50 MCG/ACT nasal spray Place 2 sprays into both nostrils daily as needed for allergies.   . furosemide (LASIX) 40 MG tablet TAKE 1 TABLET BY MOUTH EVERY DAY  . losartan (COZAAR) 25 MG tablet Take 1 tablet (25 mg total) by mouth daily.  . meclizine (ANTIVERT) 25 MG tablet Take 25 mg by mouth 3 (three) times daily as needed for dizziness.  [name] MEGARED OMEGA-3 KRILL OIL PO Take 1 tablet by mouth daily. 4 in 1  . meloxicam (  MOBIC) 7.5 MG tablet Take 7.5 mg by mouth daily as needed for pain.  . Multiple Vitamins-Minerals (MULTIVITAMIN GUMMIES ADULT) CHEW Chew 1 tablet by mouth daily. Vitafurion  . omeprazole (PRILOSEC) 40 MG capsule TAKE 1 CAPSULE BY MOUTH EVERY DAY  . Polyethyl Glycol-Propyl Glycol (SYSTANE) 0.4-0.3 % SOLN Place 1 drop into both eyes daily as needed (Dry eye).  [name] spironolactone (ALDACTONE) 25 MG tablet Take 0.5 tablets (12.5 mg total) by mouth daily.  . [DISCONTINUED] bisoprolol (ZEBETA) 5 MG tablet Take 0.5 tablets by mouth daily.  . [DISCONTINUED] pravastatin (PRAVACHOL) 40 MG tablet TAKE 1 TABLET BY MOUTH EVERY DAY    Allergies:   Patient has no known allergies.   Social History   Socioeconomic History  . Marital status: Married    Spouse name: Not on file  . Number of children:  Not on file  . Years of education: Not on file  . Highest education level: Not on file  Occupational History  . Not on file  Tobacco Use  . Smoking status: Never Smoker  . Smokeless tobacco: Never Used  Vaping Use  . Vaping Use: Never used  Substance and Sexual Activity  . Alcohol use: No  . Drug use: No  . Sexual activity: Never  Other Topics Concern  . 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   Social Determinants of Health   Financial Resource Strain:   . Difficulty of Paying Living Expenses: Not on file  Food Insecurity:   . Worried About Charity fundraiser in the Last Year: Not on file  . Ran Out of Food in the Last Year: Not on file  Transportation Needs:   . Lack of Transportation (Medical): Not on file  . Lack of Transportation (Non-Medical): Not on file  Physical Activity:   . Days of Exercise per Week: Not on file  . Minutes of Exercise per Session: Not on file  Stress:   . Feeling of Stress : Not on file  Social Connections:   . Frequency of Communication with Friends and Family: Not on file  . Frequency of Social Gatherings with Friends and Family: Not on file  . Attends Religious Services: Not on file  . Active Member of Clubs or Organizations: Not on file  . Attends Archivist Meetings: Not on file  . Marital Status: Not on file     Family History:  The patient's family history includes Arthritis in her brother; Cholelithiasis in her mother; Diabetes in her mother and sister; Heart murmur in her sister; Hypertension in her sister. There is no history of Breast cancer.  ROS:   Review of Systems  Constitutional: Positive for malaise/fatigue. Negative for chills, diaphoresis, fever and weight loss.  HENT: Negative for congestion.   Eyes: Negative for discharge and redness.  Respiratory: Negative for cough, sputum production, shortness of breath and wheezing.     Cardiovascular: Negative for chest pain, palpitations, orthopnea, claudication, leg swelling and PND.  Gastrointestinal: Negative for abdominal pain, heartburn, nausea and vomiting.  Musculoskeletal: Negative for falls and myalgias.  Skin: Negative for rash.  Neurological: Positive for weakness. Negative for dizziness, tingling, tremors, sensory change, speech change, focal weakness and loss of consciousness.  Endo/Heme/Allergies: Does not bruise/bleed easily.  Psychiatric/Behavioral: Negative for substance abuse. The patient is not nervous/anxious.   All other systems reviewed and are negative.  EKGs/Labs/Other Studies Reviewed:    Studies reviewed were summarized above. The additional studies were reviewed today:  2D echo 08/2019: 1. Left ventricular ejection fraction, by estimation, is 30 to 35%. The  left ventricle has moderately decreased function. The left ventricle  demonstrates global hypokinesis with severe hypokinesis of teh anterior,  anteroseptal wall.  2. Right ventricular systolic function is normal. The right ventricular  size is normal. There is moderately elevated pulmonary artery systolic  pressure. The estimated right ventricular systolic pressure is 92.1 mmHg.  3. Mild mitral valve regurgitation.  4. Tricuspid valve regurgitation is moderate.  5. The inferior vena cava is normal in size with greater than 50%  respiratory variability, suggesting right atrial pressure of 3 mmHg.   Comparison(s): EF 50-55%. __________  [facility] 09/2019: Conclusions: 1. No angiographically significant coronary artery disease. 2. Normal left heart filling pressures. 3. Upper normal to mildly elevated right heart and pulmonary artery pressures. 4. Normal Fick cardiac output/index.  Recommendations: 1. Optimize goal-directed medical therapy for management of nonischemic cardiomyopathy. We will increase losartan to 25 mg daily. BMP should be repeated in approximately 2 weeks  to ensure stable renal function and potassium. 2. Primary prevention of coronary artery disease. __________  Limited 2D echo 12/2019: 1. Left ventricular ejection fraction, by estimation, is 35 to 40%. The  left ventricle has moderately decreased function. The left ventricle has  no regional wall motion abnormalities. There is severe hypokinesis of the  left ventricular, anteroseptal  wall and anterior wall.  2. Right ventricular systolic function is normal. The right ventricular  size is normal. There is normal pulmonary artery systolic pressure.  3. The mitral valve is normal in structure. No evidence of mitral valve  regurgitation. No evidence of mitral stenosis.  4. The aortic valve is normal in structure. Aortic valve regurgitation is  not visualized. No aortic stenosis is present.   EKG:  EKG is ordered today.  The EKG ordered today demonstrates NSR, 69 bpm, LBBB (known)  Recent Labs: 04/20/2020: ALT 21; BUN 17; Creatinine, Ser 0.84; Hemoglobin 14.5; Platelets 217; Potassium 4.0; Sodium 140  Recent Lipid Panel    Component Value Date/Time   CHOL 142 12/17/2018 0841   TRIG 49.0 12/17/2018 0841   HDL 61.40 12/17/2018 0841   CHOLHDL 2 12/17/2018 0841   VLDL 9.8 12/17/2018 0841   LDLCALC 71 12/17/2018 0841    PHYSICAL EXAM:    VS:  BP 110/62 (BP Location: Left Arm, Patient Position: Sitting, Cuff Size: Normal)   Pulse 69   Ht 5\' 7"  (1.702 m)   Wt 249 lb (112.9 kg)   SpO2 96%   BMI 39.00 kg/m   BMI: Body mass index is 39 kg/m.  Physical Exam Vitals reviewed.  Constitutional:      Appearance: She is well-developed.  HENT:     Head: Normocephalic and atraumatic.  Eyes:     General:        Right eye: No discharge.        Left eye: No discharge.  Neck:     Vascular: No JVD.  Cardiovascular:     Rate and Rhythm: Normal rate and regular rhythm.     Pulses: No midsystolic click and no opening snap.          Posterior tibial pulses are 2+ on the right side and  2+ on the left side.     Heart sounds: Normal heart sounds, S1 normal and S2 normal. Heart sounds not distant. No murmur  heard.  No friction rub.  Pulmonary:     Effort: Pulmonary effort is normal. No respiratory distress.     Breath sounds: Examination of the right-lower field reveals rales. Examination of the left-lower field reveals rales. Rales present. No decreased breath sounds or wheezing.  Chest:     Chest wall: No tenderness.  Abdominal:     General: There is no distension.     Palpations: Abdomen is soft.     Tenderness: There is no abdominal tenderness.  Musculoskeletal:     Cervical back: Normal range of motion.     Comments: Bilateral lower extremity compression stockings in place  Skin:    General: Skin is warm and dry.     Nails: There is no clubbing.  Neurological:     Mental Status: She is alert and oriented to person, place, and time.  Psychiatric:        Speech: Speech normal.        Behavior: Behavior normal.        Thought Content: Thought content normal.        Judgment: Judgment normal.     Wt Readings from Last 3 Encounters:  04/30/20 249 lb (112.9 kg)  04/20/20 254 lb (115.2 kg)  03/29/20 253 lb 4 oz (114.9 kg)     ASSESSMENT & PLAN:   1. HFrEF secondary to NICM with underlying LBBB: She has NYHA class II-III symptoms.  Historically, escalation of GDMT has been precluded by fatigue, bradycardia, and relative hypotension.  She is mildly volume overloaded on exam today with a weight that is up 8 pounds from when I last saw her though this is improved by 4 pounds from when she was last seen in this office.  There are faint bibasilar crackles on exam.  She has not yet taken her Lasix with her last dose of this being on 11/27.  I have advised her to take furosemide on a daily basis.  We will start carvedilol 3.125 mg twice daily.  Otherwise, she will continue losartan and spironolactone.  She has had a recent follow-up renal function and potassium which were  stable as outlined above.  She follows up with EP next month for further discussion of potential CRT.  Relative hypotension precludes transition of losartan to Entresto.  CHF education.  2. HLD: LDL of 71 from 12/2018 with normal LFT in 01/2020.  She remains on pravastatin which is followed by her PCP.   Disposition: F/u with Dr. Saunders Revel or an APP in 2 months, and EP as directed.   Medication Adjustments/Labs and Tests Ordered: Current medicines are reviewed at length with the patient today.  Concerns regarding medicines are outlined above. Medication changes, Labs and Tests ordered today are summarized above and listed in the Patient Instructions accessible in Encounters.   Signed, Christell Faith, PA-C 04/30/2020 3:03 PM     [place] [address] [place] [address] [place], [place] [address] [phone]

## 2020-04-30 ENCOUNTER — Encounter: Payer: Self-pay | Admitting: Physician Assistant

## 2020-04-30 ENCOUNTER — Other Ambulatory Visit: Payer: Self-pay

## 2020-04-30 ENCOUNTER — Ambulatory Visit: Payer: Medicare HMO | Admitting: Physician Assistant

## 2020-04-30 VITALS — BP 110/62 | HR 69 | Ht 67.0 in | Wt 249.0 lb

## 2020-04-30 DIAGNOSIS — I428 Other cardiomyopathies: Secondary | ICD-10-CM | POA: Diagnosis not present

## 2020-04-30 DIAGNOSIS — I5020 Unspecified systolic (congestive) heart failure: Secondary | ICD-10-CM

## 2020-04-30 DIAGNOSIS — E785 Hyperlipidemia, unspecified: Secondary | ICD-10-CM

## 2020-04-30 DIAGNOSIS — I447 Left bundle-branch block, unspecified: Secondary | ICD-10-CM

## 2020-04-30 MED ORDER — CARVEDILOL 3.125 MG PO TABS: 3.1250 mg | ORAL_TABLET | Freq: Two times a day (BID) | ORAL | 1 refills | Status: DC

## 2020-04-30 NOTE — Patient Instructions (Signed)
Medication Instructions:  Your physician has recommended you make the following change in your medication:   START Carvedilol (Coreg) 3.125mg  - Take ONE tablet TWICE daily  *If you need a refill on your cardiac medications before your next appointment, please call your pharmacy*   Lab Work: None ordered.    Testing/Procedures: None ordered   Follow-Up: At [facility], you and your health needs are our priority.  As part of our continuing mission to provide you with exceptional heart care, we have created designated Provider Care Teams.  These Care Teams include your primary Cardiologist (physician) and Advanced Practice Providers (APPs -  Physician Assistants and Nurse Practitioners) who all work together to provide you with the care you need, when you need it.  We recommend signing up for the patient portal called "MyChart".  Sign up information is provided on this After Visit Summary.  MyChart is used to connect with patients for Virtual Visits (Telemedicine).  Patients are able to view lab/test results, encounter notes, upcoming appointments, etc.  Non-urgent messages can be sent to your provider as well.   To learn more about what you can do with MyChart, go to [email or web address].    Your next appointment:   2 month(s)  The format for your next appointment:   In Person  Provider:   You may see Nelva Bush, MD or one of the following Advanced Practice Providers on your designated Care Team:    Murray Hodgkins, NP  Christell Faith, PA-C  Marrianne Mood, PA-C  Cadence Telford, [place]  Laurann Montana, NP

## 2020-05-10 ENCOUNTER — Ambulatory Visit: Payer: Medicare HMO | Admitting: Internal Medicine

## 2020-05-10 ENCOUNTER — Encounter: Payer: Self-pay | Admitting: Internal Medicine

## 2020-05-10 ENCOUNTER — Other Ambulatory Visit: Payer: Self-pay

## 2020-05-10 VITALS — BP 120/76 | HR 59 | Ht 67.0 in | Wt 255.0 lb

## 2020-05-10 DIAGNOSIS — I5020 Unspecified systolic (congestive) heart failure: Secondary | ICD-10-CM | POA: Diagnosis not present

## 2020-05-10 NOTE — Progress Notes (Signed)
Patient Care Team: Burnard Hawthorne, FNP as PCP - General (Family Medicine) End, Harrell Gave, MD as PCP - Cardiology (Cardiology) Jackolyn Confer, MD (Internal Medicine)   HPI  Michelle [name] is a 79 y.o. female seen in follow-up for consideration of CRT in the setting of left bundle, nonischemic cardiomyopathy and class III chronic heart failure.  Drug up titration has been limited by blood pressure.  Since initial visit, she has felt much better without chest pain and less dyspnea.  She is not inclined to proceed with device implantation    DATE TEST EF        7/20 Echo  50-55%   3/21 Echo   30-35 %   4/21 LHC   No obstruc CAD  7/21 Echo  30-35%    Date Cr K Hgb  8/21 0.76 4.1 12.5   11/21  4.0 14.5     Records and Results Reviewed   Past Medical History:  Diagnosis Date  . Arthritis   . Collagen vascular disease ([place])   . GERD (gastroesophageal reflux disease)   . Headache(784.0)   . History of methotrexate therapy   . Hyperlipidemia    hx  . Lymphadenopathy of head and neck 01/2015   see on Thyroid ultrasound  . 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 Personal history of chemotherapy   . Rheumatoid arthritis [facility])     Past Surgical History:  Procedure Laterality Date  . CARDIAC CATHETERIZATION  09/2004   [facility]; EF 60%  . CARDIAC CATHETERIZATION  08/2004   [facility]  . IR FLUORO GUIDED NEEDLE PLC ASPIRATION/INJECTION LOC  06/18/2018  . PERIPHERAL VASCULAR CATHETERIZATION N/A 07/04/2015   Procedure: [name] Insertion;  Surgeon: [name], MD;  Location: [place] [facility];  Service: Cardiovascular;  Laterality: N/A;  . PORTA CATH REMOVAL N/A 06/23/2018   Procedure: PORTA CATH REMOVAL;  Surgeon: [name], MD;  Location: [place] [facility];  Service: Cardiovascular;  Laterality: N/A;  . RIGHT/LEFT HEART CATH AND CORONARY ANGIOGRAPHY Bilateral 09/20/2019   Procedure:  RIGHT/LEFT HEART CATH AND CORONARY ANGIOGRAPHY;  Surgeon: Nelva Bush, MD;  Location: [place] [facility];  Service: Cardiovascular;  Laterality: Bilateral;    Current Meds  Medication Sig  . acetaminophen (TYLENOL) 500 MG tablet Take 1,000 mg by mouth every 6 (six) hours as needed for mild pain.   [name] albuterol (VENTOLIN HFA) 108 (90 Base) MCG/ACT inhaler TAKE 2 PUFFS BY MOUTH EVERY 6 HOURS AS NEEDED FOR WHEEZE OR SHORTNESS OF BREATH  . Ascorbic Acid (VITAMIN C) 1000 MG tablet Take 1,000 mg by mouth daily.  . Calcium Carbonate-Vit D-Min (CALTRATE 600+D PLUS) 600-800 MG-UNIT CHEW Chew 1 tablet by mouth 2 (two) times daily.  . calcium-vitamin D (OSCAL WITH D) 500-200 MG-UNIT tablet Take 1 tablet by mouth daily with breakfast.  . carvedilol (COREG) 3.125 MG tablet Take 1 tablet (3.125 mg total) by mouth 2 (two) times daily.  . Cholecalciferol (VITAMIN D3) 50 MCG (2000 UT) capsule Take 2,000 Units by mouth daily.  . cyanocobalamin 2000 MCG tablet Take 2,500 mcg by mouth daily.   . fluticasone (FLONASE) 50 MCG/ACT nasal spray Place 2 sprays into both nostrils daily as needed for allergies.   . furosemide (LASIX) 40 MG tablet TAKE 1 TABLET BY MOUTH EVERY DAY  . losartan (COZAAR) 25 MG tablet Take 1 tablet (25 mg total) by  mouth daily.  . meclizine (ANTIVERT) 25 MG tablet Take 25 mg by mouth 3 (three) times daily as needed for dizziness.  [name] MEGARED OMEGA-3 KRILL OIL PO Take 1 tablet by mouth daily. 4 in 1  . meloxicam (MOBIC) 7.5 MG tablet Take 7.5 mg by mouth daily as needed for pain.  . Multiple Vitamins-Minerals (MULTIVITAMIN GUMMIES ADULT) CHEW Chew 1 tablet by mouth daily. Vitafurion  . omeprazole (PRILOSEC) 40 MG capsule TAKE 1 CAPSULE BY MOUTH EVERY DAY  . Polyethyl Glycol-Propyl Glycol (SYSTANE) 0.4-0.3 % SOLN Place 1 drop into both eyes daily as needed (Dry eye).  [name] spironolactone (ALDACTONE) 25 MG tablet Take 0.5 tablets (12.5 mg total) by mouth daily.    No Known  Allergies    Review of Systems negative except from HPI and PMH  Physical Exam   BP 120/76   Pulse (!) 59   Ht 5\' 7"  (1.702 m)   Wt 255 lb (115.7 kg)   BMI 39.94 kg/m  Well developed and Morbidly obese in no acute distress HENT normal Neck supple with JVP-flat Clear Regular rate and rhythm, no  murmur Abd-soft with active BS No Clubbing cyanosis   edema Skin-warm and dry A & Oriented  Grossly normal sensory and motor function      Assessment and  Plan Nonischemic cardiomyopathy  Left bundle branch block QRS duration 140-150 ms  Congestive heart failure-class 3b  Morbidly obese   Improved on carvedilol and aldactone symptomatically; Euvolemic continue current meds  She is not interested in pursuing CRT;    Continue current meds  Will see prn and she will followup with CE/RD     Current medicines are reviewed at length with the patient today .  The patient does not  have concerns regarding medicines.

## 2020-05-10 NOTE — Patient Instructions (Signed)
Medication Instructions:  Your physician recommends that you continue on your current medications as directed. Please refer to the Current Medication list given to you today.  *If you need a refill on your cardiac medications before your next appointment, please call your pharmacy*   Follow-Up: At [facility], you and your health needs are our priority.  As part of our continuing mission to provide you with exceptional heart care, we have created designated Provider Care Teams.  These Care Teams include your primary Cardiologist (physician) and Advanced Practice Providers (APPs -  Physician Assistants and Nurse Practitioners) who all work together to provide you with the care you need, when you need it.  We recommend signing up for the patient portal called "MyChart".  Sign up information is provided on this After Visit Summary.  MyChart is used to connect with patients for Virtual Visits (Telemedicine).  Patients are able to view lab/test results, encounter notes, upcoming appointments, etc.  Non-urgent messages can be sent to your provider as well.   To learn more about what you can do with MyChart, go to [email or web address].    Your next appointment:    As needed with Dr. Caryl Comes

## 2020-05-11 ENCOUNTER — Ambulatory Visit: Payer: Medicare HMO | Admitting: Physician Assistant

## 2020-06-01 ENCOUNTER — Other Ambulatory Visit: Payer: Self-pay | Admitting: Family

## 2020-06-01 DIAGNOSIS — E785 Hyperlipidemia, unspecified: Secondary | ICD-10-CM

## 2020-06-04 NOTE — Telephone Encounter (Signed)
Okay to refill? Rx was d/c'd on 04/30/20

## 2020-06-05 ENCOUNTER — Telehealth: Payer: Self-pay | Admitting: Family

## 2020-06-05 NOTE — Telephone Encounter (Signed)
Call pt She is due for f/u for cholesterol and fasting labs Please sch Let her know I refilled pravastatin 40mg ; please confirm she is taking daily

## 2020-06-05 NOTE — Telephone Encounter (Signed)
FYI patient taking pravastatin daily. Pt needed 8am appointment & is scheduled 3/11 for fasting labs.

## 2020-06-21 ENCOUNTER — Other Ambulatory Visit: Payer: Self-pay | Admitting: Internal Medicine

## 2020-06-27 NOTE — Progress Notes (Signed)
Cardiology Office Note    Date:  07/02/2020   ID:  Michelle [name] 1940/09/21, MRN [id number]  PCP:  Burnard Hawthorne, FNP  Cardiologist:  Nelva Bush, MD  Electrophysiologist:  Virl Axe, MD   Chief Complaint: Follow-up  History of Present Illness:   Michelle [name] is a 80 y.o. female with history of HFrEF secondary toNICM,rheumatoid arthritis, mantle cell lymphoma,HLD, LBBB, obesity, and GERD who presents for follow-up of her cardiomyopathy.  Remote LHC from 08/2004 showed no significant CAD with an LVEF of 60%. Prior echo from 12/2018 showed an EF of 50 to 55%, mild LVH, grade 1 diastolic dysfunction, normal RV systolic function and ventricular cavity size, and mild biatrial enlargement. More recently, echo in 08/2019 showed a new cardiomyopathy with an LVEF of 30 to 35%, global hypokinesis with severe hypokinesis of the anterior and anteroseptal walls, normal RV systolic function and ventricular cavity size, mild pulmonary hypertension, mild mitral regurgitation, moderate tricuspid regurgitation. Diagnostic R/LHC in 09/2019 showed no angiographically significant CAD with normal left heart filling pressures. Upper normal to mildly elevated right heart and pulmonary artery pressures. Normal cardiac output/index. She has been managed with GDMT with symptom improvement. She was seen on 11/21/2019 feeling slightly better than she had at her prior visits. She still had some fatigue and exertional dyspnea with modest activity though this was improving. She wastakingfurosemide a few days per week as it was difficult for her to use the restroom while working. She was euvolemic with a weight of 243 pounds. She remainedNYHA class III. She was started on bisoprolol 2.5 mg nightly and continued on losartan with recommendation to attempt to take Lasix on a daily basis.We subsequently received a phone call indicating she was not tolerating bisoprolol and in thissetting,  itwas discontinued. She underwent follow-up limited echo on maximally tolerated GDMT on 12/26/2019,which showed an EF of35 to 40%, severe HK of the anteroseptal and anterior wall, normal RV systolic function and RV cavity size, and no significant valvular abnormality.She was seen in the office on 12/30/2019 and was doing well from a cardiac perspective. She continued to feel about the same as she did with her last visit with continued fatigue and exertional dyspnea with mild to modest activity. She was taking Lasix 40 mg approximately 3 days/week, was adding salt to some of her foods, and drinking over 2 L of liquid per day. Her weight was stable at 242 pounds. Since discontinuing bisoprolol she felt like her energy had improved. Her vital signs precluded escalation of GDMT at that time. She was referred to EP for consideration of CRT with underlying LBBB with persistent cardiomyopathy on maximally tolerated GDMT.  She was seen in 03/2020 and was euvolemic with escalation of GDMT being precluded by fatigue, bradycardia, and relative hypotension leading to the tapering of losartan. She continued to take Lasix 3 days/week rather than daily.  She was evaluated by EP on 03/29/2020 with recommendation to further escalate GDMT initially.  I last saw her on 04/30/2020 at which time she was doing well from a cardiac perspective.  She did note some improvement in her dyspnea.  She was taking her Lasix approximately 3 to 4 days/week.  It was noted that historically escalation of GDMT has been precluded by fatigue, bradycardia, and relative hypotension.  She was mildly volume overloaded and advised to take Lasix on a daily basis.  She was started on low-dose carvedilol 3.125 mg twice daily.  She followed up with EP on 05/10/2020  with a weight that was up 6 pounds when compared to her visit in late 04/2020.  She continued to note she was feeling better and was not interested in pursuing CRT.  She comes in  accompanied by her husband today and is doing very well from a cardiac perspective.  Since she was last seen she denies any chest pain, worsening dyspnea, palpitations, dizziness, presyncope, or syncope.  She does continue to note some shortness of breath though this is improved from prior.  Her weight is down 6 pounds today when compared to her last in person clinic visit.  She indicates on average she is able to take Lasix 40 mg approximately 2 days/week as she forgets some days.  Nonetheless, despite this, she appears to be in a euvolemic state.  She is watching her salt and p.o. fluid consumption.  She is tolerating all medications without issues.  BP remained stable at home.  She is quite pleased with her progress and does not have any issues or concerns at this time.   Labs independently reviewed: 04/2020 - HGB 14.5, PLT 217, potassium 4.0, BUN 17, SCr 0.84, albumin 4.1, AST/ALT normal 12/2018-TC 142, TG 49, HDL 61, LDL 71, TSH normal  Past Medical History:  Diagnosis Date  . Arthritis   . Collagen vascular disease ([place])   . GERD (gastroesophageal reflux disease)   . Headache(784.0)   . History of methotrexate therapy   . Hyperlipidemia    hx  . Lymphadenopathy of head and neck 01/2015   see on Thyroid ultrasound  . 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 Personal history of chemotherapy   . Rheumatoid arthritis [facility])     Past Surgical History:  Procedure Laterality Date  . CARDIAC CATHETERIZATION  09/2004   [facility]; EF 60%  . CARDIAC CATHETERIZATION  08/2004   [facility]  . IR FLUORO GUIDED NEEDLE PLC ASPIRATION/INJECTION LOC  06/18/2018  . PERIPHERAL VASCULAR CATHETERIZATION N/A 07/04/2015   Procedure: [name] Insertion;  Surgeon: [name], MD;  Location: [facility];  Service: Cardiovascular;  Laterality: N/A;  . PORTA CATH REMOVAL N/A 06/23/2018   Procedure: PORTA CATH REMOVAL;  Surgeon: [name], MD;   Location: [facility];  Service: Cardiovascular;  Laterality: N/A;  . RIGHT/LEFT HEART CATH AND CORONARY ANGIOGRAPHY Bilateral 09/20/2019   Procedure: RIGHT/LEFT HEART CATH AND CORONARY ANGIOGRAPHY;  Surgeon: Nelva Bush, MD;  Location: [facility];  Service: Cardiovascular;  Laterality: Bilateral;    Current Medications: Current Meds  Medication Sig  . acetaminophen (TYLENOL) 500 MG tablet Take 1,000 mg by mouth every 6 (six) hours as needed for mild pain.   [name] albuterol (VENTOLIN HFA) 108 (90 Base) MCG/ACT inhaler TAKE 2 PUFFS BY MOUTH EVERY 6 HOURS AS NEEDED FOR WHEEZE OR SHORTNESS OF BREATH  . Ascorbic Acid (VITAMIN C) 1000 MG tablet Take 1,000 mg by mouth daily.  . Calcium Carbonate-Vit D-Min (CALTRATE 600+D PLUS) 600-800 MG-UNIT CHEW Chew 1 tablet by mouth 2 (two) times daily.  . calcium-vitamin D (OSCAL WITH D) 500-200 MG-UNIT tablet Take 1 tablet by mouth daily with breakfast.  . carvedilol (COREG) 3.125 MG tablet Take 1 tablet (3.125 mg total) by mouth 2 (two) times daily.  . Cholecalciferol (VITAMIN D3) 50 MCG (2000 UT) capsule Take 2,000 Units by mouth daily.  . cyanocobalamin 2000 MCG tablet Take 2,500 mcg by mouth daily.   [name]  fluticasone (FLONASE) 50 MCG/ACT nasal spray Place 2 sprays into both nostrils daily as needed for allergies.   . furosemide (LASIX) 40 MG tablet TAKE 1 TABLET BY MOUTH EVERY DAY  . meclizine (ANTIVERT) 25 MG tablet Take 25 mg by mouth 3 (three) times daily as needed for dizziness.  [name] MEGARED OMEGA-3 KRILL OIL PO Take 1 tablet by mouth daily. 4 in 1  . meloxicam (MOBIC) 7.5 MG tablet Take 7.5 mg by mouth daily as needed for pain.  . Multiple Vitamins-Minerals (MULTIVITAMIN GUMMIES ADULT) CHEW Chew 1 tablet by mouth daily. Vitafurion  . omeprazole (PRILOSEC) 40 MG capsule TAKE 1 CAPSULE BY MOUTH EVERY DAY  . Polyethyl Glycol-Propyl Glycol (SYSTANE) 0.4-0.3 % SOLN Place 1 drop into both eyes daily as needed (Dry eye).  . pravastatin  (PRAVACHOL) 40 MG tablet TAKE 1 TABLET BY MOUTH EVERY DAY  . spironolactone (ALDACTONE) 25 MG tablet TAKE 1/2 TABLET BY MOUTH EVERY DAY    Allergies:   Patient has no known allergies.   Social History   Socioeconomic History  . Marital status: Married    Spouse name: Not on file  . Number of children: Not on file  . Years of education: Not on file  . Highest education level: Not on file  Occupational History  . Not on file  Tobacco Use  . Smoking status: Never Smoker  . Smokeless tobacco: Never Used  Vaping Use  . Vaping Use: Never used  Substance and Sexual Activity  . Alcohol use: No  . Drug use: No  . Sexual activity: Never  Other Topics Concern  . 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   Social Determinants of Health   Financial Resource Strain: Not on file  Food Insecurity: Not on file  Transportation Needs: Not on file  Physical Activity: Not on file  Stress: Not on file  Social Connections: Not on file     Family History:  The patient's family history includes Arthritis in her brother; Cholelithiasis in her mother; Diabetes in her mother and sister; Heart murmur in her sister; Hypertension in her sister. There is no history of Breast cancer.  ROS:   Review of Systems  Constitutional: Negative for chills, diaphoresis, fever, malaise/fatigue and weight loss.  HENT: Negative for congestion.   Eyes: Negative for discharge and redness.  Respiratory: Positive for shortness of breath. Negative for cough, sputum production and wheezing.        Mild stable dyspnea  Cardiovascular: Negative for chest pain, palpitations, orthopnea, claudication, leg swelling and PND.  Gastrointestinal: Negative for abdominal pain, heartburn, nausea and vomiting.  Musculoskeletal: Negative for falls and myalgias.  Skin: Negative for rash.  Neurological: Negative for dizziness, tingling, tremors,  sensory change, speech change, focal weakness, loss of consciousness and weakness.  Endo/Heme/Allergies: Does not bruise/bleed easily.  Psychiatric/Behavioral: Negative for substance abuse. The patient is not nervous/anxious.   All other systems reviewed and are negative.    EKGs/Labs/Other Studies Reviewed:    Studies reviewed were summarized above. The additional studies were reviewed today:  2D echo 08/2019: 1. Left ventricular ejection fraction, by estimation, is 30 to 35%. The  left ventricle has moderately decreased function. The left ventricle  demonstrates global hypokinesis with severe hypokinesis of teh anterior,  anteroseptal wall.  2. Right ventricular systolic function is normal. The right ventricular  size is normal. There is  moderately elevated pulmonary artery systolic  pressure. The estimated right ventricular systolic pressure is [id number] mmHg.  3. Mild mitral valve regurgitation.  4. Tricuspid valve regurgitation is moderate.  5. The inferior vena cava is normal in size with greater than 50%  respiratory variability, suggesting right atrial pressure of 3 mmHg.   Comparison(s): EF 50-55%. __________  [facility] 09/2019: Conclusions: 1. No angiographically significant coronary artery disease. 2. Normal left heart filling pressures. 3. Upper normal to mildly elevated right heart and pulmonary artery pressures. 4. Normal Fick cardiac output/index.  Recommendations: 1. Optimize goal-directed medical therapy for management of nonischemic cardiomyopathy. We will increase losartan to 25 mg daily. BMP should be repeated in approximately 2 weeks to ensure stable renal function and potassium. 2. Primary prevention of coronary artery disease. __________  Limited 2D echo 12/2019: 1. Left ventricular ejection fraction, by estimation, is 35 to 40%. The  left ventricle has moderately decreased function. The left ventricle has  no regional wall motion abnormalities. There is  severe hypokinesis of the  left ventricular, anteroseptal  wall and anterior wall.  2. Right ventricular systolic function is normal. The right ventricular  size is normal. There is normal pulmonary artery systolic pressure.  3. The mitral valve is normal in structure. No evidence of mitral valve  regurgitation. No evidence of mitral stenosis.  4. The aortic valve is normal in structure. Aortic valve regurgitation is  not visualized. No aortic stenosis is present.   EKG:  EKG is not ordered today.   Recent Labs: 04/20/2020: ALT 21; BUN 17; Creatinine, Ser 0.84; Hemoglobin 14.5; Platelets 217; Potassium 4.0; Sodium 140  Recent Lipid Panel    Component Value Date/Time   CHOL 142 12/17/2018 0841   TRIG 49.0 12/17/2018 0841   HDL 61.40 12/17/2018 0841   CHOLHDL 2 12/17/2018 0841   VLDL 9.8 12/17/2018 0841   LDLCALC 71 12/17/2018 0841    PHYSICAL EXAM:    VS:  BP 106/60   Pulse 61   Ht 5\' 7"  (1.702 m)   Wt 249 lb 12.8 oz (113.3 kg)   BMI 39.12 kg/m   BMI: Body mass index is 39.12 kg/m.  Physical Exam Vitals reviewed.  Constitutional:      Appearance: She is well-developed and well-nourished.  HENT:     Head: Normocephalic and atraumatic.  Eyes:     General:        Right eye: No discharge.        Left eye: No discharge.  Neck:     Vascular: No JVD.  Cardiovascular:     Rate and Rhythm: Normal rate and regular rhythm.     Pulses: No midsystolic click and no opening snap.          Posterior tibial pulses are 2+ on the right side and 2+ on the left side.     Heart sounds: Normal heart sounds, S1 normal and S2 normal. Heart sounds not distant. No murmur heard. No friction rub.  Pulmonary:     Effort: Pulmonary effort is normal. No respiratory distress.     Breath sounds: Normal breath sounds. No decreased breath sounds, wheezing or rales.  Chest:     Chest wall: No tenderness.  Abdominal:     General: There is no distension.     Palpations: Abdomen is soft.      Tenderness: There is no abdominal tenderness.  Musculoskeletal:        General: No edema.     Cervical back: Normal  range of motion.     Comments: Bilateral compression stockings noted  Skin:    General: Skin is warm and dry.     Nails: There is no clubbing or cyanosis.  Neurological:     Mental Status: She is alert and oriented to person, place, and time.  Psychiatric:        Mood and Affect: Mood and affect normal.        Speech: Speech normal.        Behavior: Behavior normal.        Thought Content: Thought content normal.        Judgment: Judgment normal.     Wt Readings from Last 3 Encounters:  07/02/20 249 lb 12.8 oz (113.3 kg)  05/10/20 255 lb (115.7 kg)  04/30/20 249 lb (112.9 kg)     ASSESSMENT & PLAN:   1. HFrEF secondary to NICM/LBBB: She is doing well, appears euvolemic, and well compensated.  NYHA class II symptoms.  Historically, escalation of GDMT has been limited by underlying fatigue, heart rate, and relative hypotension.  She has been evaluated by EP and given significant improvement in symptoms as well as improvement in her cardiomyopathy CRT has been deferred by the patient.  She continues to do very well.  In this setting we will continue current medical therapy including carvedilol, losartan, and spironolactone.  She is not needing Lasix on a daily basis to maintain euvolemia.  Relative hypotension precluded transition of losartan to Entresto, addition of SGLT2 inhibitor or escalation of current medical therapy.  Recent BMP showed normal renal function and potassium at goal.  CHF education.  2. HLD: LDL 71 from 12/2018 with normal LFT in 04/2020. She remains on pravastatin which is followed by her PCP.    Disposition: F/u with Dr. Saunders Revel or an APP in 6 months, sooner if needed.   Medication Adjustments/Labs and Tests Ordered: Current medicines are reviewed at length with the patient today.  Concerns regarding medicines are outlined above. Medication changes,  Labs and Tests ordered today are summarized above and listed in the Patient Instructions accessible in Encounters.   Signed, Christell Faith, PA-C 07/02/2020 3:20 PM     [address] [place], [place] [address] [phone]

## 2020-07-02 ENCOUNTER — Encounter: Payer: Self-pay | Admitting: Physician Assistant

## 2020-07-02 ENCOUNTER — Ambulatory Visit: Payer: Medicare HMO | Admitting: Physician Assistant

## 2020-07-02 ENCOUNTER — Other Ambulatory Visit: Payer: Self-pay

## 2020-07-02 VITALS — BP 106/60 | HR 61 | Ht 67.0 in | Wt 249.8 lb

## 2020-07-02 DIAGNOSIS — I447 Left bundle-branch block, unspecified: Secondary | ICD-10-CM

## 2020-07-02 DIAGNOSIS — I428 Other cardiomyopathies: Secondary | ICD-10-CM | POA: Diagnosis not present

## 2020-07-02 DIAGNOSIS — I5020 Unspecified systolic (congestive) heart failure: Secondary | ICD-10-CM

## 2020-07-02 DIAGNOSIS — E785 Hyperlipidemia, unspecified: Secondary | ICD-10-CM | POA: Diagnosis not present

## 2020-07-02 NOTE — Patient Instructions (Signed)
Medication Instructions:  No changes  *If you need a refill on your cardiac medications before your next appointment, please call your pharmacy*   Lab Work: None  If you have labs (blood work) drawn today and your tests are completely normal, you will receive your results only by: [name] MyChart Message (if you have MyChart) OR . A paper copy in the mail If you have any lab test that is abnormal or we need to change your treatment, we will call you to review the results.   Testing/Procedures: None   Follow-Up: At [facility], you and your health needs are our priority.  As part of our continuing mission to provide you with exceptional heart care, we have created designated Provider Care Teams.  These Care Teams include your primary Cardiologist (physician) and Advanced Practice Providers (APPs -  Physician Assistants and Nurse Practitioners) who all work together to provide you with the care you need, when you need it.  We recommend signing up for the patient portal called "MyChart".  Sign up information is provided on this After Visit Summary.  MyChart is used to connect with patients for Virtual Visits (Telemedicine).  Patients are able to view lab/test results, encounter notes, upcoming appointments, etc.  Non-urgent messages can be sent to your provider as well.   To learn more about what you can do with MyChart, go to [email or web address].    Your next appointment:   6 month(s)  The format for your next appointment:   In Person  Provider:   Nelva Bush, MD or Christell Faith, PA-C

## 2020-08-05 ENCOUNTER — Other Ambulatory Visit: Payer: Self-pay | Admitting: Family

## 2020-08-10 ENCOUNTER — Other Ambulatory Visit: Payer: Self-pay

## 2020-08-10 ENCOUNTER — Encounter: Payer: Self-pay | Admitting: Family

## 2020-08-10 ENCOUNTER — Ambulatory Visit (INDEPENDENT_AMBULATORY_CARE_PROVIDER_SITE_OTHER): Payer: Medicare HMO | Admitting: Family

## 2020-08-10 VITALS — BP 114/62 | HR 62 | Temp 98.2°F | Ht 67.0 in | Wt 251.6 lb

## 2020-08-10 DIAGNOSIS — M069 Rheumatoid arthritis, unspecified: Secondary | ICD-10-CM

## 2020-08-10 DIAGNOSIS — R0600 Dyspnea, unspecified: Secondary | ICD-10-CM

## 2020-08-10 DIAGNOSIS — E785 Hyperlipidemia, unspecified: Secondary | ICD-10-CM | POA: Diagnosis not present

## 2020-08-10 DIAGNOSIS — R2689 Other abnormalities of gait and mobility: Secondary | ICD-10-CM

## 2020-08-10 DIAGNOSIS — R0609 Other forms of dyspnea: Secondary | ICD-10-CM

## 2020-08-10 MED ORDER — ROSUVASTATIN CALCIUM 5 MG PO TABS: 5.0000 mg | ORAL_TABLET | Freq: Every day | ORAL | 3 refills | Status: DC

## 2020-08-10 NOTE — Patient Instructions (Signed)
Start crestor Referral for physical therapy AND to pulmonology Let us know if you dont hear back within a week in regards to an appointment being scheduled.

## 2020-08-10 NOTE — Assessment & Plan Note (Signed)
Discussed deconditioning , obesity and chronic new pain as it related to balance problems. Agreed to start with PT consult. Close follow up.

## 2020-08-10 NOTE — Assessment & Plan Note (Signed)
She declines returning to rheumatology . Will continue to discuss at future visits.

## 2020-08-10 NOTE — Progress Notes (Signed)
Subjective:    Patient ID: Michelle [name], female    DOB: 1940-09-25, 80 y.o.   MRN: [id number]  CC: Michelle [name] is a 80 y.o. female who presents today for follow up.   HPI: Accompanied by sister today  Complains of trouble with balance over the past 6 weeks, comes and goes. She will feels 'clumsy'. She endorses bilateral knee pain without swelling and thinks contributing to her balance. Pain in knees is worse after long periods of sitting. Stairs are difficult for her. No numbness in feet. Using biofreeze with relief. She has knee injection in the past with Dr Jefm Bryant and doesn't want to pursue this again.   [place] recently when going to stand in which she stumbled backwards to carpet floor. She was not dizzy at the time. No Head injury,LOC.  She wears compression stockings.  Denies vertigo, syncope, CP, numbness in feet.  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   Complains of dyspnea on exertion, chronic. This is unchanged. No cough, wheezing.  Never smoker. She sleeps on two pillows to help with breathing. Sleep apnea test many years ago and states she was cipap machine at that time. She snores.   HTN- compliant with coreg 3.125mg  BID, losartan 25mg ,   HF- compliant with lasix 40mg  qod ( approx 3 days per week), spironlactone.   Hasnt used meclizine  HLD- she is no longer on pravastatin as thought making her 'heart flutter'. Symptom resolved since stopping medication.   R/o RA- she stopped seeing rheumatology for no particular reason, Dr Jefm Bryant.  No wrist or hand pain or swelling.        HFrEF, LBBB- Follows with cardiology ; last visit 07/02/20 with Christell Faith HISTORY:  Past Medical History:  Diagnosis Date  . Arthritis   . Collagen vascular disease ([place])   . GERD (gastroesophageal reflux disease)   . Headache(784.0)   . History of methotrexate therapy   . Hyperlipidemia    hx  . Lymphadenopathy of head and neck 01/2015   see on  Thyroid ultrasound  . Lymphoma, mantle cell (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 Personal history of chemotherapy   . Rheumatoid arthritis [facility])    Past Surgical History:  Procedure Laterality Date  . CARDIAC CATHETERIZATION  09/2004   [facility]; EF 60%  . CARDIAC CATHETERIZATION  08/2004   [facility]  . IR FLUORO GUIDED NEEDLE PLC ASPIRATION/INJECTION LOC  06/18/2018  . PERIPHERAL VASCULAR CATHETERIZATION N/A 07/04/2015   Procedure: [name] Insertion;  Surgeon: [name], MD;  Location: [place] [facility];  Service: Cardiovascular;  Laterality: N/A;  . PORTA CATH REMOVAL N/A 06/23/2018   Procedure: PORTA CATH REMOVAL;  Surgeon: [name], MD;  Location: [place] [facility];  Service: Cardiovascular;  Laterality: N/A;  . RIGHT/LEFT HEART CATH AND CORONARY ANGIOGRAPHY Bilateral 09/20/2019   Procedure: RIGHT/LEFT HEART CATH AND CORONARY ANGIOGRAPHY;  Surgeon: Nelva Bush, MD;  Location: [place] [facility];  Service: Cardiovascular;  Laterality: Bilateral;   Family History  Problem Relation Age of Onset  . Diabetes Mother   . Cholelithiasis Mother   . Hypertension Sister   . Diabetes Sister   . Heart murmur Sister   . Arthritis Brother   . Breast cancer Neg Hx     Allergies: Patient has no known allergies. Current Outpatient Medications on File Prior to Visit  Medication  Sig Dispense Refill  . acetaminophen (TYLENOL) 500 MG tablet Take 1,000 mg by mouth every 6 (six) hours as needed for mild pain.     [name] albuterol (VENTOLIN HFA) 108 (90 Base) MCG/ACT inhaler TAKE 2 PUFFS BY MOUTH EVERY 6 HOURS AS NEEDED FOR WHEEZE OR SHORTNESS OF BREATH 18 each 1  . carvedilol (COREG) 3.125 MG tablet Take 1 tablet (3.125 mg total) by mouth 2 (two) times daily. 180 tablet 1  . cyanocobalamin 2000 MCG tablet Take 2,500 mcg by mouth daily.     . fluticasone (FLONASE) 50 MCG/ACT nasal spray Place 2 sprays into both nostrils daily as needed for allergies.    4  . furosemide (LASIX) 40 MG tablet TAKE 1 TABLET BY MOUTH EVERY DAY 90 tablet 1  . MEGARED OMEGA-3 KRILL OIL PO Take 1 tablet by mouth daily. 4 in 1    . meloxicam (MOBIC) 7.5 MG tablet Take 7.5 mg by mouth daily as needed for pain.    [name] omeprazole (PRILOSEC) 40 MG capsule TAKE 1 CAPSULE BY MOUTH EVERY DAY 90 capsule 1  . spironolactone (ALDACTONE) 25 MG tablet TAKE 1/2 TABLET BY MOUTH EVERY DAY 45 tablet 3  . losartan (COZAAR) 25 MG tablet Take 1 tablet (25 mg total) by mouth daily. 90 tablet 1   Current Facility-Administered Medications on File Prior to Visit  Medication Dose Route Frequency Provider Last Rate Last Admin  . heparin lock flush 100 unit/mL  500 Units Intravenous Once Charlaine Dalton R, MD      . sodium chloride flush (NS) 0.9 % injection 10 mL  10 mL Intravenous Once Charlaine Dalton R, MD      . sodium chloride flush (NS) 0.9 % injection 10 mL  10 mL Intravenous Once Cammie Sickle, MD      . Tbo-Filgrastim [facility]) injection 480 mcg  480 mcg Subcutaneous Once Cammie Sickle, MD        Social History   Tobacco Use  . Smoking status: Never Smoker  . Smokeless tobacco: Never Used  Vaping Use  . Vaping Use: Never used  Substance Use Topics  . Alcohol use: No  . Drug use: No    Review of Systems  Constitutional: Negative for chills and fever.  Respiratory: Positive for shortness of breath. Negative for cough and wheezing.   Cardiovascular: Negative for chest pain, palpitations and leg swelling.  Gastrointestinal: Negative for nausea and vomiting.  Musculoskeletal: Positive for arthralgias (knee pain).  Neurological: Negative for dizziness and numbness.      Objective:    BP 114/62   Pulse 62   Temp 98.2 F (36.8 C)   Ht 5\' 7"  (1.702 m)   Wt 251 lb 9.6 oz (114.1 kg)   SpO2 98%   BMI 39.41 kg/m  BP Readings from Last 3 Encounters:  08/10/20 114/62  07/02/20 106/60  05/10/20 120/76   Wt Readings from Last 3 Encounters:  08/10/20  251 lb 9.6 oz (114.1 kg)  07/02/20 249 lb 12.8 oz (113.3 kg)  05/10/20 255 lb (115.7 kg)    Physical Exam Vitals reviewed.  Constitutional:      Appearance: She is well-developed.  Eyes:     Conjunctiva/sclera: Conjunctivae normal.  Cardiovascular:     Rate and Rhythm: Normal rate and regular rhythm.     Pulses: Normal pulses.     Heart sounds: Normal heart sounds.  Pulmonary:     Effort: Pulmonary effort is normal.     Breath sounds:  Normal breath sounds. No wheezing, rhonchi or rales.  Skin:    General: Skin is warm and dry.  Neurological:     Mental Status: She is alert.  Psychiatric:        Speech: Speech normal.        Behavior: Behavior normal.        Thought Content: Thought content normal.        Assessment & Plan:   Problem List Items Addressed This Visit      Musculoskeletal and Integument   Rheumatoid arthritis ([place])    She declines returning to rheumatology . Will continue to discuss at future visits.           Other   Balance problems - Primary    Discussed deconditioning , obesity and chronic new pain as it related to balance problems. Agreed to start with PT consult. Close follow up.       Relevant Orders   Ambulatory referral to Physical Therapy   Dyspnea on exertion    Chronic. Discussed HFrEF as it relates to SOB on exertion, obesity. H/o OSA and not currently being treated. Advised pulmonology consult for OSA re-evaluation and dyspnea.       Relevant Orders   Ambulatory referral to Pulmonology   HLD (hyperlipidemia)    Anticipate uncontrolled. She stopped pravastatin. Will trial crestor 5mg . CMP in 6 weeks.      Relevant Medications   rosuvastatin (CRESTOR) 5 MG tablet   Other Relevant Orders   Comprehensive metabolic panel       I have discontinued [place]. [place]ren's Caltrate 600+D Plus Minerals, Multivitamin Gummies Adult, meclizine, Vitamin D3, vitamin C, Systane, calcium-vitamin D, and pravastatin. I am also having her start  on rosuvastatin. Additionally, I am having her maintain her fluticasone, cyanocobalamin, acetaminophen, MEGARED OMEGA-3 KRILL OIL PO, albuterol, meloxicam, losartan, furosemide, carvedilol, spironolactone, and omeprazole.   Meds ordered this encounter  Medications  . rosuvastatin (CRESTOR) 5 MG tablet    Sig: Take 1 tablet (5 mg total) by mouth daily.    Dispense:  90 tablet    Refill:  3    Order Specific Question:   Supervising Provider    Answer:   Crecencio Mc [[id number]]    Return precautions given.   Risks, benefits, and alternatives of the medications and treatment plan prescribed today were discussed, and patient expressed understanding.   Education regarding symptom management and diagnosis given to patient on AVS.  Continue to follow with Burnard Hawthorne, FNP for routine health maintenance.   [name] and I agreed with plan.   Mable Paris, FNP

## 2020-08-10 NOTE — Assessment & Plan Note (Signed)
Chronic. Discussed HFrEF as it relates to SOB on exertion, obesity. H/o OSA and not currently being treated. Advised pulmonology consult for OSA re-evaluation and dyspnea.

## 2020-08-10 NOTE — Assessment & Plan Note (Signed)
Anticipate uncontrolled. She stopped pravastatin. Will trial crestor 5mg . CMP in 6 weeks.

## 2020-08-17 ENCOUNTER — Ambulatory Visit: Payer: Medicare HMO | Admitting: Internal Medicine

## 2020-08-17 ENCOUNTER — Other Ambulatory Visit: Payer: Medicare HMO

## 2020-08-24 ENCOUNTER — Inpatient Hospital Stay: Payer: Medicare HMO | Admitting: Internal Medicine

## 2020-08-24 ENCOUNTER — Encounter: Payer: Self-pay | Admitting: Internal Medicine

## 2020-08-24 ENCOUNTER — Inpatient Hospital Stay: Payer: Medicare HMO | Attending: Internal Medicine

## 2020-08-24 DIAGNOSIS — C8311 Mantle cell lymphoma, lymph nodes of head, face, and neck: Secondary | ICD-10-CM

## 2020-08-24 DIAGNOSIS — I272 Pulmonary hypertension, unspecified: Secondary | ICD-10-CM | POA: Diagnosis not present

## 2020-08-24 DIAGNOSIS — C8318 Mantle cell lymphoma, lymph nodes of multiple sites: Secondary | ICD-10-CM | POA: Insufficient documentation

## 2020-08-24 LAB — COMPREHENSIVE METABOLIC PANEL
ALT: 16 U/L (ref 0–44)
AST: 24 U/L (ref 15–41)
Albumin: 3.9 g/dL (ref 3.5–5.0)
Alkaline Phosphatase: 74 U/L (ref 38–126)
Anion gap: 9 (ref 5–15)
BUN: 13 mg/dL (ref 8–23)
CO2: 28 mmol/L (ref 22–32)
Calcium: 9.1 mg/dL (ref 8.9–10.3)
Chloride: 102 mmol/L (ref 98–111)
Creatinine, Ser: 0.81 mg/dL (ref 0.44–1.00)
GFR, Estimated: >60 mL/min (ref >60)
Glucose, Bld: 103 mg/dL — ABNORMAL HIGH (ref 70–99)
Potassium: 4.2 mmol/L (ref 3.5–5.1)
Sodium: 139 mmol/L (ref 135–145)
Total Bilirubin: 0.6 mg/dL (ref 0.3–1.2)
Total Protein: 6.8 g/dL (ref 6.5–8.1)

## 2020-08-24 LAB — CBC WITH DIFFERENTIAL/PLATELET
Abs Immature Granulocytes: 0.02 10*3/uL (ref 0.00–0.07)
Basophils Absolute: 0 10*3/uL (ref 0.0–0.1)
Basophils Relative: 1 %
Eosinophils Absolute: 0.2 10*3/uL (ref 0.0–0.5)
Eosinophils Relative: 3 %
HCT: 43.2 % (ref 36.0–46.0)
Hemoglobin: 13.8 g/dL (ref 12.0–15.0)
Immature Granulocytes: 0 %
Lymphocytes Relative: 25 %
Lymphs Abs: 1.4 10*3/uL (ref 0.7–4.0)
MCH: 29.4 pg (ref 26.0–34.0)
MCHC: 31.9 g/dL (ref 30.0–36.0)
MCV: 91.9 fL (ref 80.0–100.0)
Monocytes Absolute: 0.4 10*3/uL (ref 0.1–1.0)
Monocytes Relative: 7 %
Neutro Abs: 3.6 10*3/uL (ref 1.7–7.7)
Neutrophils Relative %: 64 %
Platelets: 190 10*3/uL (ref 150–400)
RBC: 4.7 MIL/uL (ref 3.87–5.11)
RDW: 14.3 % (ref 11.5–15.5)
WBC: 5.6 10*3/uL (ref 4.0–10.5)
nRBC: 0 % (ref 0.0–0.2)

## 2020-08-24 LAB — LACTATE DEHYDROGENASE: LDH: 171 U/L (ref 98–192)

## 2020-08-24 NOTE — Progress Notes (Signed)
[place] OFFICE PROGRESS NOTE  Patient Care Team: Burnard Hawthorne, FNP as PCP - General (Family Medicine) End, Harrell Gave, MD as PCP - Cardiology (Cardiology) Deboraha Sprang, MD as PCP - Electrophysiology (Cardiology) Jackolyn Confer, MD (Internal Medicine)  Cancer Staging No matching staging information was found for the patient.   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 Feb 9th- START Benda-Ritux with neulasta; Prolonged neutropenia; DISCONT- Benda-Ritux;   # April 13 th 2017- START R-CHOP x1; severe/prolonged neutropenia; PET- CR; BMBx-Neg; Disc R-CHOP  # 26th MAY 2017- Start Rituxan q 56M Main OCT 12th 2017- PET NED.  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  female pleasant patient above history of mantle cell lymphoma currently on surveillance is here for follow-up/is here for follow-up.  Patient awaiting sleep study with her PCP.  She also has been evaluated cardiology -started on Coreg ; CRT on hold.    She has mild shortness of breath on exertion.  However she is doing overall well.  Denies any unusual fevers or chills.  No new lumps or bumps.  Review of Systems  Constitutional: Negative for chills, diaphoresis, fever, malaise/fatigue and weight loss.  HENT: Negative for nosebleeds and sore throat.   Eyes: Negative for double vision.  Respiratory: Negative for hemoptysis, sputum production and  wheezing.   Cardiovascular: Negative for chest pain, palpitations, orthopnea and leg swelling.  Gastrointestinal: Negative for abdominal pain, blood in stool, constipation, diarrhea, heartburn, melena, nausea and vomiting.  Genitourinary: Negative for dysuria, frequency and urgency.  Musculoskeletal: Positive for back pain and joint pain.  Skin: Negative.  Negative for itching and rash.  Neurological: Negative for dizziness, tingling, focal weakness, weakness and headaches.  Endo/Heme/Allergies: Does not bruise/bleed easily.  Psychiatric/Behavioral: Negative for depression. The patient is not nervous/anxious and does not have insomnia.     PAST MEDICAL HISTORY :  Past Medical History:  Diagnosis Date  . Arthritis   . Collagen vascular disease ([place])   . GERD (gastroesophageal reflux disease)   . Headache(784.0)   . History of methotrexate therapy   . Hyperlipidemia    hx  . Lymphadenopathy of head and neck 01/2015   see on Thyroid ultrasound  . Lymphoma, mantle cell ([place]) 06/01/2015   bx of lymph node in right breast/Stage IV Mantle Cell Lymphoma  . Personal history of chemotherapy   . Rheumatoid arthritis ([place])     PAST SURGICAL HISTORY :   Past Surgical History:  Procedure Laterality Date  . CARDIAC CATHETERIZATION  09/2004   [facility]; EF 60%  . CARDIAC CATHETERIZATION  08/2004   [facility]  . IR FLUORO GUIDED NEEDLE PLC ASPIRATION/INJECTION LOC  06/18/2018  . PERIPHERAL VASCULAR CATHETERIZATION N/A 07/04/2015   Procedure: [name] Insertion;  Surgeon: [name], MD;  Location: [place] [facility];  Service: Cardiovascular;  Laterality: N/A;  . PORTA CATH REMOVAL N/A 06/23/2018   Procedure: PORTA CATH REMOVAL;  Surgeon:  [name], MD;  Location: [place] [facility];  Service: Cardiovascular;  Laterality: N/A;  . RIGHT/LEFT HEART CATH AND CORONARY ANGIOGRAPHY Bilateral 09/20/2019   Procedure: RIGHT/LEFT HEART CATH AND CORONARY ANGIOGRAPHY;  Surgeon: Nelva Bush, MD;  Location:  [place] [facility];  Service: Cardiovascular;  Laterality: Bilateral;    FAMILY HISTORY :   Family History  Problem Relation Age of Onset  . Diabetes Mother   . Cholelithiasis Mother   . Hypertension Sister   . Diabetes Sister   . Heart murmur Sister   . Arthritis Brother   . Breast cancer Neg Hx     SOCIAL HISTORY:   Social History   Tobacco Use  . Smoking status: Never Smoker  . Smokeless tobacco: Never Used  Vaping Use  . Vaping Use: Never used  Substance Use Topics  . Alcohol use: No  . Drug use: No    ALLERGIES:  has No Known Allergies.  MEDICATIONS:  Current Outpatient Medications  Medication Sig Dispense Refill  . acetaminophen (TYLENOL) 500 MG tablet Take 1,000 mg by mouth every 6 (six) hours as needed for mild pain.     [name] albuterol (VENTOLIN HFA) 108 (90 Base) MCG/ACT inhaler TAKE 2 PUFFS BY MOUTH EVERY 6 HOURS AS NEEDED FOR WHEEZE OR SHORTNESS OF BREATH 18 each 1  . carvedilol (COREG) 3.125 MG tablet Take 1 tablet (3.125 mg total) by mouth 2 (two) times daily. 180 tablet 1  . cyanocobalamin 2000 MCG tablet Take 2,500 mcg by mouth daily.     . fluticasone (FLONASE) 50 MCG/ACT nasal spray Place 2 sprays into both nostrils daily as needed for allergies.   4  . furosemide (LASIX) 40 MG tablet TAKE 1 TABLET BY MOUTH EVERY DAY 90 tablet 1  . losartan (COZAAR) 25 MG tablet Take 1 tablet (25 mg total) by mouth daily. 90 tablet 1  . MEGARED OMEGA-3 KRILL OIL PO Take 1 tablet by mouth daily. 4 in 1    . meloxicam (MOBIC) 7.5 MG tablet Take 7.5 mg by mouth daily as needed for pain.    [name] omeprazole (PRILOSEC) 40 MG capsule TAKE 1 CAPSULE BY MOUTH EVERY DAY 90 capsule 1  . rosuvastatin (CRESTOR) 5 MG tablet Take 1 tablet (5 mg total) by mouth daily. 90 tablet 3  . spironolactone (ALDACTONE) 25 MG tablet TAKE 1/2 TABLET BY MOUTH EVERY DAY 45 tablet 3   No current facility-administered medications for this visit.   Facility-Administered Medications Ordered in Other Visits   Medication Dose Route Frequency Provider Last Rate Last Admin  . heparin lock flush 100 unit/mL  500 Units Intravenous Once Charlaine Dalton R, MD      . sodium chloride flush (NS) 0.9 % injection 10 mL  10 mL Intravenous Once Charlaine Dalton R, MD      . sodium chloride flush (NS) 0.9 % injection 10 mL  10 mL Intravenous Once Cammie Sickle, MD      . Tbo-Filgrastim [facility]) injection 480 mcg  480 mcg Subcutaneous Once Cammie Sickle, MD        PHYSICAL EXAMINATION: ECOG PERFORMANCE STATUS: 0 - Asymptomatic  BP 105/69 (BP Location: Right Arm, Patient Position: Sitting, Cuff Size: Normal)   Pulse 60   Temp 98.6 F (37 C) (Tympanic)   Resp 16   Wt 248 lb (112.5 kg)   BMI 38.84 kg/m   Filed Weights   08/24/20 1010  Weight: 248 lb (112.5 kg)    Physical Exam  Constitutional:      Comments: Obese.  Walking herself with cane.   HENT:     Head: Normocephalic and atraumatic.     Mouth/Throat:     Pharynx: No oropharyngeal exudate.  Eyes:     Pupils: Pupils are equal, round, and reactive to light.  Cardiovascular:     Rate and Rhythm: Normal rate and regular rhythm.  Pulmonary:     Effort: No respiratory distress.     Breath sounds: No wheezing.  Abdominal:     General: Bowel sounds are normal. There is no distension.     Palpations: Abdomen is soft. There is no mass.     Tenderness: There is no abdominal tenderness. There is no guarding or rebound.  Musculoskeletal:        General: No tenderness. Normal range of motion.     Cervical back: Normal range of motion and neck supple.  Skin:    General: Skin is warm.  Neurological:     Mental Status: She is alert and oriented to person, place, and time.  Psychiatric:        Mood and Affect: Affect normal.    LABORATORY DATA:  I have reviewed the data as listed    Component Value Date/Time   NA 139 08/24/2020 0952   NA 145 (H) 09/08/2019 1359   NA 142 09/14/2013 1127   K 4.2 08/24/2020 0952   K 3.5  09/14/2013 1127   CL 102 08/24/2020 0952   CL 110 (H) 09/14/2013 1127   CO2 28 08/24/2020 0952   CO2 29 09/14/2013 1127   GLUCOSE 103 (H) 08/24/2020 0952   GLUCOSE 106 (H) 09/14/2013 1127   BUN 13 08/24/2020 0952   BUN 15 09/08/2019 1359   BUN 8 09/14/2013 1127   CREATININE 0.81 08/24/2020 0952   CREATININE 0.71 09/14/2013 1127   CREATININE 0.68 04/01/2013 1550   CALCIUM 9.1 08/24/2020 0952   CALCIUM 8.6 09/14/2013 1127   PROT 6.8 08/24/2020 0952   PROT 7.6 09/14/2013 1127   ALBUMIN 3.9 08/24/2020 0952   ALBUMIN 3.2 (L) 09/14/2013 1127   AST 24 08/24/2020 0952   AST 22 09/14/2013 1127   ALT 16 08/24/2020 0952   ALT 19 09/14/2013 1127   ALKPHOS 74 08/24/2020 0952   ALKPHOS 76 09/14/2013 1127   BILITOT 0.6 08/24/2020 0952   BILITOT 0.4 09/14/2013 1127   GFRNONAA >60 08/24/2020 0952   GFRNONAA >60 09/14/2013 1127   GFRAA >60 01/20/2020 1303   GFRAA >60 09/14/2013 1127    No results found for: SPEP, UPEP  Lab Results  Component Value Date   WBC 5.6 08/24/2020   NEUTROABS 3.6 08/24/2020   HGB 13.8 08/24/2020   HCT 43.2 08/24/2020   MCV 91.9 08/24/2020   PLT 190 08/24/2020      Chemistry      Component Value Date/Time   NA 139 08/24/2020 0952   NA 145 (H) 09/08/2019 1359   NA 142 09/14/2013 1127   K 4.2 08/24/2020 0952   K 3.5 09/14/2013 1127   CL 102 08/24/2020 0952   CL 110 (H) 09/14/2013 1127   CO2 28 08/24/2020 0952   CO2 29 09/14/2013 1127   BUN 13 08/24/2020 0952   BUN 15 09/08/2019 1359   BUN 8 09/14/2013 1127   CREATININE 0.81 08/24/2020 0952   CREATININE 0.71 09/14/2013 1127   CREATININE 0.68 04/01/2013 1550      Component Value Date/Time   CALCIUM 9.1 08/24/2020 0952  CALCIUM 8.6 09/14/2013 1127   ALKPHOS 74 08/24/2020 0952   ALKPHOS 76 09/14/2013 1127   AST 24 08/24/2020 0952   AST 22 09/14/2013 1127   ALT 16 08/24/2020 0952   ALT 19 09/14/2013 1127   BILITOT 0.6 08/24/2020 0952   BILITOT 0.4 09/14/2013 1127       RADIOGRAPHIC  STUDIES: I have personally reviewed the radiological images as listed and agreed with the findings in the report. No results found.   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  Stable except continued mild bil lung base scarring/see below/dilated pulmonary artery. STABLE.  # Chronic arthritis-NSAIDs as needed. STABLE.   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 STABLE  # DISPOSITION: # follow up in 3 months-MD /labs-cbc/cmp/ldh; CT C/A/P prior;.B .        Orders Placed This Encounter  Procedures  . CT CHEST ABDOMEN PELVIS W CONTRAST    Standing Status:   Future    Standing Expiration Date:   08/24/2021    Order Specific Question:   Preferred imaging location?    Answer:   [facility]    Order Specific Question:   Radiology Contrast Protocol - do NOT remove file path    Answer:   [email or web address]  . CBC with Differential    Standing Status:   Future    Standing Expiration Date:   08/24/2021  . Comprehensive metabolic panel    Standing Status:   Future    Standing Expiration Date:   08/24/2021  . Lactate dehydrogenase    Standing Status:   Future    Standing Expiration Date:   08/24/2021   All questions were answered. The patient knows to call the clinic with any problems, questions or concerns.      Cammie Sickle, MD 08/24/2020 5:38 PM

## 2020-08-24 NOTE — Assessment & Plan Note (Addendum)
#  Mantle cell lymphoma stage IV-currently on surveillance [last Rituxan August 2019]; NOV 17th 2021-  CT scan no evidence of recurrence of lymphoma/no splenomegaly.  Stable except continued mild bil lung base scarring/see below/dilated pulmonary artery. STABLE.  # Chronic arthritis-NSAIDs as needed. STABLE.   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 STABLE  # DISPOSITION: # follow up in 3 months-MD /labs-cbc/cmp/ldh; CT C/A/P prior;.B .

## 2020-08-31 DIAGNOSIS — M799 Soft tissue disorder, unspecified: Secondary | ICD-10-CM | POA: Diagnosis not present

## 2020-08-31 DIAGNOSIS — R262 Difficulty in walking, not elsewhere classified: Secondary | ICD-10-CM | POA: Diagnosis not present

## 2020-08-31 DIAGNOSIS — M6281 Muscle weakness (generalized): Secondary | ICD-10-CM | POA: Diagnosis not present

## 2020-08-31 DIAGNOSIS — M25562 Pain in left knee: Secondary | ICD-10-CM | POA: Diagnosis not present

## 2020-08-31 DIAGNOSIS — M25561 Pain in right knee: Secondary | ICD-10-CM | POA: Diagnosis not present

## 2020-09-03 ENCOUNTER — Other Ambulatory Visit: Payer: Self-pay | Admitting: Physician Assistant

## 2020-09-03 DIAGNOSIS — M6281 Muscle weakness (generalized): Secondary | ICD-10-CM | POA: Diagnosis not present

## 2020-09-03 DIAGNOSIS — M799 Soft tissue disorder, unspecified: Secondary | ICD-10-CM | POA: Diagnosis not present

## 2020-09-03 DIAGNOSIS — M25562 Pain in left knee: Secondary | ICD-10-CM | POA: Diagnosis not present

## 2020-09-03 DIAGNOSIS — R262 Difficulty in walking, not elsewhere classified: Secondary | ICD-10-CM | POA: Diagnosis not present

## 2020-09-03 DIAGNOSIS — M25561 Pain in right knee: Secondary | ICD-10-CM | POA: Diagnosis not present

## 2020-09-03 NOTE — Telephone Encounter (Signed)
Rx request sent to pharmacy.  

## 2020-09-05 DIAGNOSIS — M6281 Muscle weakness (generalized): Secondary | ICD-10-CM | POA: Diagnosis not present

## 2020-09-05 DIAGNOSIS — R262 Difficulty in walking, not elsewhere classified: Secondary | ICD-10-CM | POA: Diagnosis not present

## 2020-09-05 DIAGNOSIS — M25561 Pain in right knee: Secondary | ICD-10-CM | POA: Diagnosis not present

## 2020-09-05 DIAGNOSIS — M25562 Pain in left knee: Secondary | ICD-10-CM | POA: Diagnosis not present

## 2020-09-05 DIAGNOSIS — M799 Soft tissue disorder, unspecified: Secondary | ICD-10-CM | POA: Diagnosis not present

## 2020-09-11 ENCOUNTER — Ambulatory Visit (INDEPENDENT_AMBULATORY_CARE_PROVIDER_SITE_OTHER): Payer: Medicare HMO | Admitting: Primary Care

## 2020-09-11 ENCOUNTER — Other Ambulatory Visit: Payer: Self-pay

## 2020-09-11 ENCOUNTER — Encounter: Payer: Self-pay | Admitting: Primary Care

## 2020-09-11 VITALS — BP 112/70 | HR 67 | Temp 97.6°F | Ht 67.0 in | Wt 252.0 lb

## 2020-09-11 DIAGNOSIS — R0609 Other forms of dyspnea: Secondary | ICD-10-CM

## 2020-09-11 DIAGNOSIS — M25561 Pain in right knee: Secondary | ICD-10-CM | POA: Diagnosis not present

## 2020-09-11 DIAGNOSIS — M6281 Muscle weakness (generalized): Secondary | ICD-10-CM | POA: Diagnosis not present

## 2020-09-11 DIAGNOSIS — M25562 Pain in left knee: Secondary | ICD-10-CM | POA: Diagnosis not present

## 2020-09-11 DIAGNOSIS — R0602 Shortness of breath: Secondary | ICD-10-CM

## 2020-09-11 DIAGNOSIS — M799 Soft tissue disorder, unspecified: Secondary | ICD-10-CM | POA: Diagnosis not present

## 2020-09-11 DIAGNOSIS — R0600 Dyspnea, unspecified: Secondary | ICD-10-CM

## 2020-09-11 DIAGNOSIS — G4719 Other hypersomnia: Secondary | ICD-10-CM | POA: Diagnosis not present

## 2020-09-11 DIAGNOSIS — Z8669 Personal history of other diseases of the nervous system and sense organs: Secondary | ICD-10-CM | POA: Diagnosis not present

## 2020-09-11 DIAGNOSIS — R262 Difficulty in walking, not elsewhere classified: Secondary | ICD-10-CM | POA: Diagnosis not present

## 2020-09-11 NOTE — Assessment & Plan Note (Addendum)
-   Patient has hx RA, not currently on medications. Appears she declined returning to Rheumatology when she saw her PCP in March. 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 Recommend getting full PFTs to assess lung function and diffusion capacity. Monitor symptoms. Continue Albuterol hfa prn sob.

## 2020-09-11 NOTE — Assessment & Plan Note (Signed)
-   Hx sleep apnea, previously on CPAP. Sleep study not available. Symptoms loud snoring and apnea. She has evidence of pulmonary HTN on imaging. Needs home sleep study to re-evaluate for sleep apnea. She is open to resuming CPAP if needed. Discussed risk of untreated sleep apnea including cardiac arthymias, pulm HTN, stroke, DM and risk daytime accidents. Reviewed treatment options including oral appliance, CPAP and referral to ENT. FU in 6-8 weeks with Dr. Mortimer Fries after sleep study and PFTs.

## 2020-09-11 NOTE — Patient Instructions (Signed)
Pleasure meeting you both today  Recommendations Use albuterol 2 puffs every 6 hours as needed for shortness of breath Maintain healthy weight; recommend side sleeping position or elevate head of bed  Do not drive if experiencing excessive daytime fatigue   Orders: Home sleep study re: daytime sleepiness/snoring Pulmonary function test re: dyspnea  Follow-up: 6-8 weeks in office with Dr. Mortimer Fries (new patient- June 21st or 22nd ) or APP

## 2020-09-11 NOTE — Progress Notes (Signed)
@Patient  ID: Michelle [name], female    DOB: [birth year], 80 y.o.   MRN: [id number]  Chief Complaint  Patient presents with  . sleep consult    Per Mable Paris, NP--prior sleep study years ago. Last wore cpap years ago. C/o daytime sleepiness, wakes gasping for air and loud snoring.     Referring provider: Burnard Hawthorne, FNP  HPI: 80 year old female, never smoked. PMH significant for heart failure, cardiomyopathy, GERD, RA, hyperlipidemia, obesity. Patient referred to [facility] pulmonary for sleep consult d/t snoring.   09/11/2020 Patient presents today for sleep consult. She has hx sleep apnea. She was previously on CPAP, no longer wearing. She reports symptoms of daytime sleepiness, loud snoring and waking up gasping for air over the last year. Typical bedtime 8-10pm. She wakes up on average 4-5 times at night. She gets out of bed around 5:30am. She has some shortness of breath on exertion and an occasional nocturnal cough. She has albuterol inahler to use as needed which does help. Pollen exacerbates her cough. CT chest in November 2021 showed stable mild pulmonary fibrosis, evidence pulmonary hypertension. Denies chest tightness or wheezing.   Sleep questionnaire: Symptoms- Loud snoring, daytime fatigue, waking up gasping for air Prior sleep study- Not available Bedtime- 8-10pm Time to fall asleep - < 5 mins Nocturnal awakenings- 5 times Out of bed- 5:30am [place]-12/24    No Known Allergies  Immunization History  Administered Date(s) Administered  . Influenza Split 02/18/2012  . Influenza, High Dose Seasonal PF 01/29/2017, 02/19/2018, 02/23/2020  . Influenza,inj,Quad PF,6+ Mos 04/01/2013, 03/02/2014, 06/25/2015  . Influenza-Unspecified 03/14/2016, 02/23/2019  . PFIZER(Purple Top)SARS-COV-2 Vaccination 07/18/2019, 08/08/2019, 03/24/2020  . Pneumococcal Conjugate-13 01/12/2014  . Pneumococcal Polysaccharide-23 11/12/2009  . Tdap 11/12/2009  . Zoster Recombinat  (Shingrix) 03/15/2019, 05/20/2019    Past Medical History:  Diagnosis Date  . Arthritis   . Collagen vascular disease ([place])   . GERD (gastroesophageal reflux disease)   . Headache(784.0)   . History of methotrexate therapy   . Hyperlipidemia    hx  . Lymphadenopathy of head and neck 01/2015   see on Thyroid ultrasound  . 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 Personal history of chemotherapy   . Rheumatoid arthritis ([place])     Tobacco History: Social History   Tobacco Use  Smoking Status Never Smoker  Smokeless Tobacco Never Used   Counseling given: Not Answered   Outpatient Medications Prior to Visit  Medication Sig Dispense Refill  . acetaminophen (TYLENOL) 500 MG tablet Take 1,000 mg by mouth every 6 (six) hours as needed for mild pain.     [name] albuterol (VENTOLIN HFA) 108 (90 Base) MCG/ACT inhaler TAKE 2 PUFFS BY MOUTH EVERY 6 HOURS AS NEEDED FOR WHEEZE OR SHORTNESS OF BREATH 18 each 1  . carvedilol (COREG) 3.125 MG tablet Take 1 tablet (3.125 mg total) by mouth 2 (two) times daily. 180 tablet 1  . cyanocobalamin 2000 MCG tablet Take 2,500 mcg by mouth daily.     . fluticasone (FLONASE) 50 MCG/ACT nasal spray Place 2 sprays into both nostrils daily as needed for allergies.   4  . furosemide (LASIX) 40 MG tablet TAKE 1 TABLET BY MOUTH EVERY DAY 90 tablet 1  . losartan (COZAAR) 25 MG tablet TAKE 1 TABLET BY MOUTH EVERY DAY 90 tablet 1  . MEGARED OMEGA-3 KRILL OIL PO Take 1 tablet by mouth daily. 4 in 1    .  meloxicam (MOBIC) 7.5 MG tablet Take 7.5 mg by mouth daily as needed for pain.    [name] omeprazole (PRILOSEC) 40 MG capsule TAKE 1 CAPSULE BY MOUTH EVERY DAY 90 capsule 1  . rosuvastatin (CRESTOR) 5 MG tablet Take 1 tablet (5 mg total) by mouth daily. 90 tablet 3  . spironolactone (ALDACTONE) 25 MG tablet TAKE 1/2 TABLET BY MOUTH EVERY DAY 45 tablet 3   Facility-Administered Medications Prior to Visit   Medication Dose Route Frequency Provider Last Rate Last Admin  . heparin lock flush 100 unit/mL  500 Units Intravenous Once Charlaine Dalton R, MD      . sodium chloride flush (NS) 0.9 % injection 10 mL  10 mL Intravenous Once Charlaine Dalton R, MD      . sodium chloride flush (NS) 0.9 % injection 10 mL  10 mL Intravenous Once Cammie Sickle, MD      . Tbo-Filgrastim [facility]) injection 480 mcg  480 mcg Subcutaneous Once Cammie Sickle, MD        Review of Systems  Review of Systems  HENT: Positive for congestion.   Respiratory: Positive for cough and shortness of breath.   Psychiatric/Behavioral: Positive for sleep disturbance.   Physical Exam  BP 112/70 (BP Location: Left Arm, Cuff Size: Normal)   Pulse 67   Temp 97.6 F (36.4 C) (Temporal)   Ht 5\' 7"  (1.702 m)   Wt 252 lb (114.3 kg)   SpO2 97%   BMI 39.47 kg/m  Physical Exam Constitutional:      Appearance: Normal appearance.  HENT:     Mouth/Throat:     Comments: Deferred d/t masking Cardiovascular:     Rate and Rhythm: Normal rate and regular rhythm.  Pulmonary:     Effort: Pulmonary effort is normal.     Breath sounds: Rales present.  Musculoskeletal:        General: Normal range of motion.  Skin:    General: Skin is warm and dry.  Neurological:     General: No focal deficit present.     Mental Status: She is alert and oriented to person, place, and time. Mental status is at baseline.  Psychiatric:        Mood and Affect: Mood normal.        Behavior: Behavior normal.        Thought Content: Thought content normal.        Judgment: Judgment normal.      Lab Results:  CBC    Component Value Date/Time   WBC 5.6 08/24/2020 0952   RBC 4.70 08/24/2020 0952   HGB 13.8 08/24/2020 0952   HGB 13.4 09/08/2019 1359   HCT 43.2 08/24/2020 0952   HCT 40.9 09/08/2019 1359   PLT 190 08/24/2020 0952   PLT 194 09/08/2019 1359   MCV 91.9 08/24/2020 0952   MCV 90 09/08/2019 1359   MCV 89  09/14/2013 1127   MCH 29.4 08/24/2020 0952   MCHC 31.9 08/24/2020 0952   RDW 14.3 08/24/2020 0952   RDW 13.9 09/08/2019 1359   RDW 15.1 (H) 09/14/2013 1127   LYMPHSABS 1.4 08/24/2020 0952   LYMPHSABS 1.3 09/08/2019 1359   MONOABS 0.4 08/24/2020 0952   EOSABS 0.2 08/24/2020 0952   EOSABS 0.1 09/08/2019 1359   BASOSABS 0.0 08/24/2020 0952   BASOSABS 0.0 09/08/2019 1359    BMET    Component Value Date/Time   NA 139 08/24/2020 0952   NA 145 (H) 09/08/2019 1359   NA  142 09/14/2013 1127   K 4.2 08/24/2020 0952   K 3.5 09/14/2013 1127   CL 102 08/24/2020 0952   CL 110 (H) 09/14/2013 1127   CO2 28 08/24/2020 0952   CO2 29 09/14/2013 1127   GLUCOSE 103 (H) 08/24/2020 0952   GLUCOSE 106 (H) 09/14/2013 1127   BUN 13 08/24/2020 0952   BUN 15 09/08/2019 1359   BUN 8 09/14/2013 1127   CREATININE 0.81 08/24/2020 0952   CREATININE 0.71 09/14/2013 1127   CREATININE 0.68 04/01/2013 1550   CALCIUM 9.1 08/24/2020 0952   CALCIUM 8.6 09/14/2013 1127   GFRNONAA >60 08/24/2020 0952   GFRNONAA >60 09/14/2013 1127   GFRAA >60 01/20/2020 1303   GFRAA >60 09/14/2013 1127    BNP No results found for: BNP  ProBNP No results found for: PROBNP  Imaging: No results found.   Assessment & Plan:   Hx of sleep apnea - Hx sleep apnea, previously on CPAP. Sleep study not available. Symptoms loud snoring and apnea. She has evidence of pulmonary HTN on imaging. Needs home sleep study to re-evaluate for sleep apnea. She is open to resuming CPAP if needed. Discussed risk of untreated sleep apnea including cardiac arthymias, pulm HTN, stroke, DM and risk daytime accidents. Reviewed treatment options including oral appliance, CPAP and referral to ENT. FU in 6-8 weeks with Dr. Mortimer Fries after sleep study and PFTs.   Dyspnea on exertion - Patient has hx RA, not currently on medications. Appears she declined returning to Rheumatology when she saw her PCP in March. Imaging as shown stable mild pulmonary  fibrosis. Recommend getting full PFTs to assess lung function and diffusion capacity. Monitor symptoms. Continue Albuterol hfa prn sob.    Martyn Ehrich, NP 09/11/2020

## 2020-09-13 DIAGNOSIS — R262 Difficulty in walking, not elsewhere classified: Secondary | ICD-10-CM | POA: Diagnosis not present

## 2020-09-13 DIAGNOSIS — M6281 Muscle weakness (generalized): Secondary | ICD-10-CM | POA: Diagnosis not present

## 2020-09-13 DIAGNOSIS — M25561 Pain in right knee: Secondary | ICD-10-CM | POA: Diagnosis not present

## 2020-09-13 DIAGNOSIS — M25562 Pain in left knee: Secondary | ICD-10-CM | POA: Diagnosis not present

## 2020-09-13 DIAGNOSIS — M799 Soft tissue disorder, unspecified: Secondary | ICD-10-CM | POA: Diagnosis not present

## 2020-09-19 DIAGNOSIS — M25562 Pain in left knee: Secondary | ICD-10-CM | POA: Diagnosis not present

## 2020-09-19 DIAGNOSIS — M25561 Pain in right knee: Secondary | ICD-10-CM | POA: Diagnosis not present

## 2020-09-19 DIAGNOSIS — M6281 Muscle weakness (generalized): Secondary | ICD-10-CM | POA: Diagnosis not present

## 2020-09-19 DIAGNOSIS — R262 Difficulty in walking, not elsewhere classified: Secondary | ICD-10-CM | POA: Diagnosis not present

## 2020-09-19 DIAGNOSIS — M799 Soft tissue disorder, unspecified: Secondary | ICD-10-CM | POA: Diagnosis not present

## 2020-09-20 DIAGNOSIS — M25561 Pain in right knee: Secondary | ICD-10-CM | POA: Diagnosis not present

## 2020-09-20 DIAGNOSIS — M6281 Muscle weakness (generalized): Secondary | ICD-10-CM | POA: Diagnosis not present

## 2020-09-20 DIAGNOSIS — M25562 Pain in left knee: Secondary | ICD-10-CM | POA: Diagnosis not present

## 2020-09-20 DIAGNOSIS — M799 Soft tissue disorder, unspecified: Secondary | ICD-10-CM | POA: Diagnosis not present

## 2020-09-20 DIAGNOSIS — R262 Difficulty in walking, not elsewhere classified: Secondary | ICD-10-CM | POA: Diagnosis not present

## 2020-09-21 ENCOUNTER — Other Ambulatory Visit: Payer: Self-pay

## 2020-09-21 ENCOUNTER — Other Ambulatory Visit (INDEPENDENT_AMBULATORY_CARE_PROVIDER_SITE_OTHER): Payer: Medicare HMO

## 2020-09-21 DIAGNOSIS — E785 Hyperlipidemia, unspecified: Secondary | ICD-10-CM | POA: Diagnosis not present

## 2020-09-21 LAB — COMPREHENSIVE METABOLIC PANEL
ALT: 11 U/L (ref 0–35)
AST: 18 U/L (ref 0–37)
Albumin: 3.7 g/dL (ref 3.5–5.2)
Alkaline Phosphatase: 79 U/L (ref 39–117)
BUN: 12 mg/dL (ref 6–23)
CO2: 31 mEq/L (ref 19–32)
Calcium: 9.1 mg/dL (ref 8.4–10.5)
Chloride: 105 mEq/L (ref 96–112)
Creatinine, Ser: 0.74 mg/dL (ref 0.40–1.20)
GFR: 77.9 mL/min (ref >60.00)
Glucose, Bld: 86 mg/dL (ref 70–99)
Potassium: 3.9 mEq/L (ref 3.5–5.1)
Sodium: 143 mEq/L (ref 135–145)
Total Bilirubin: 0.6 mg/dL (ref 0.2–1.2)
Total Protein: 6.1 g/dL (ref 6.0–8.3)

## 2020-09-23 ENCOUNTER — Other Ambulatory Visit: Payer: Self-pay | Admitting: Internal Medicine

## 2020-09-26 DIAGNOSIS — M25561 Pain in right knee: Secondary | ICD-10-CM | POA: Diagnosis not present

## 2020-09-26 DIAGNOSIS — M799 Soft tissue disorder, unspecified: Secondary | ICD-10-CM | POA: Diagnosis not present

## 2020-09-26 DIAGNOSIS — M6281 Muscle weakness (generalized): Secondary | ICD-10-CM | POA: Diagnosis not present

## 2020-09-26 DIAGNOSIS — M25562 Pain in left knee: Secondary | ICD-10-CM | POA: Diagnosis not present

## 2020-09-26 DIAGNOSIS — R262 Difficulty in walking, not elsewhere classified: Secondary | ICD-10-CM | POA: Diagnosis not present

## 2020-09-28 DIAGNOSIS — M6281 Muscle weakness (generalized): Secondary | ICD-10-CM | POA: Diagnosis not present

## 2020-09-28 DIAGNOSIS — M25562 Pain in left knee: Secondary | ICD-10-CM | POA: Diagnosis not present

## 2020-09-28 DIAGNOSIS — M25561 Pain in right knee: Secondary | ICD-10-CM | POA: Diagnosis not present

## 2020-09-28 DIAGNOSIS — R262 Difficulty in walking, not elsewhere classified: Secondary | ICD-10-CM | POA: Diagnosis not present

## 2020-09-28 DIAGNOSIS — M799 Soft tissue disorder, unspecified: Secondary | ICD-10-CM | POA: Diagnosis not present

## 2020-10-01 DIAGNOSIS — M25561 Pain in right knee: Secondary | ICD-10-CM | POA: Diagnosis not present

## 2020-10-01 DIAGNOSIS — M6281 Muscle weakness (generalized): Secondary | ICD-10-CM | POA: Diagnosis not present

## 2020-10-01 DIAGNOSIS — M25562 Pain in left knee: Secondary | ICD-10-CM | POA: Diagnosis not present

## 2020-10-01 DIAGNOSIS — M799 Soft tissue disorder, unspecified: Secondary | ICD-10-CM | POA: Diagnosis not present

## 2020-10-01 DIAGNOSIS — R262 Difficulty in walking, not elsewhere classified: Secondary | ICD-10-CM | POA: Diagnosis not present

## 2020-10-05 DIAGNOSIS — M25562 Pain in left knee: Secondary | ICD-10-CM | POA: Diagnosis not present

## 2020-10-05 DIAGNOSIS — M25561 Pain in right knee: Secondary | ICD-10-CM | POA: Diagnosis not present

## 2020-10-05 DIAGNOSIS — M799 Soft tissue disorder, unspecified: Secondary | ICD-10-CM | POA: Diagnosis not present

## 2020-10-05 DIAGNOSIS — M6281 Muscle weakness (generalized): Secondary | ICD-10-CM | POA: Diagnosis not present

## 2020-10-05 DIAGNOSIS — R262 Difficulty in walking, not elsewhere classified: Secondary | ICD-10-CM | POA: Diagnosis not present

## 2020-10-08 DIAGNOSIS — M25561 Pain in right knee: Secondary | ICD-10-CM | POA: Diagnosis not present

## 2020-10-08 DIAGNOSIS — M6281 Muscle weakness (generalized): Secondary | ICD-10-CM | POA: Diagnosis not present

## 2020-10-08 DIAGNOSIS — M799 Soft tissue disorder, unspecified: Secondary | ICD-10-CM | POA: Diagnosis not present

## 2020-10-08 DIAGNOSIS — R262 Difficulty in walking, not elsewhere classified: Secondary | ICD-10-CM | POA: Diagnosis not present

## 2020-10-08 DIAGNOSIS — M25562 Pain in left knee: Secondary | ICD-10-CM | POA: Diagnosis not present

## 2020-10-12 ENCOUNTER — Other Ambulatory Visit: Payer: Self-pay

## 2020-10-12 ENCOUNTER — Encounter: Payer: Self-pay | Admitting: Family

## 2020-10-12 ENCOUNTER — Ambulatory Visit (INDEPENDENT_AMBULATORY_CARE_PROVIDER_SITE_OTHER): Payer: Medicare HMO | Admitting: Family

## 2020-10-12 VITALS — BP 110/70 | HR 73 | Temp 97.8°F | Ht 67.0 in | Wt 252.8 lb

## 2020-10-12 DIAGNOSIS — M6281 Muscle weakness (generalized): Secondary | ICD-10-CM | POA: Diagnosis not present

## 2020-10-12 DIAGNOSIS — Z13820 Encounter for screening for osteoporosis: Secondary | ICD-10-CM

## 2020-10-12 DIAGNOSIS — Z1231 Encounter for screening mammogram for malignant neoplasm of breast: Secondary | ICD-10-CM | POA: Diagnosis not present

## 2020-10-12 DIAGNOSIS — I700 Atherosclerosis of aorta: Secondary | ICD-10-CM | POA: Diagnosis not present

## 2020-10-12 DIAGNOSIS — N9489 Other specified conditions associated with female genital organs and menstrual cycle: Secondary | ICD-10-CM

## 2020-10-12 DIAGNOSIS — M799 Soft tissue disorder, unspecified: Secondary | ICD-10-CM | POA: Diagnosis not present

## 2020-10-12 DIAGNOSIS — M25561 Pain in right knee: Secondary | ICD-10-CM | POA: Diagnosis not present

## 2020-10-12 DIAGNOSIS — C8311 Mantle cell lymphoma, lymph nodes of head, face, and neck: Secondary | ICD-10-CM

## 2020-10-12 DIAGNOSIS — J309 Allergic rhinitis, unspecified: Secondary | ICD-10-CM

## 2020-10-12 DIAGNOSIS — M25562 Pain in left knee: Secondary | ICD-10-CM | POA: Diagnosis not present

## 2020-10-12 DIAGNOSIS — R262 Difficulty in walking, not elsewhere classified: Secondary | ICD-10-CM | POA: Diagnosis not present

## 2020-10-12 LAB — LIPID PANEL
Cholesterol: 151 mg/dL (ref 0–200)
HDL: 64.7 mg/dL (ref >39.00)
LDL Cholesterol: 77 mg/dL (ref 0–99)
NonHDL: 85.97
Total CHOL/HDL Ratio: 2
Triglycerides: 44 mg/dL (ref 0.0–149.0)
VLDL: 8.8 mg/dL (ref 0.0–40.0)

## 2020-10-12 LAB — COMPREHENSIVE METABOLIC PANEL
ALT: 13 U/L (ref 0–35)
AST: 21 U/L (ref 0–37)
Albumin: 4 g/dL (ref 3.5–5.2)
Alkaline Phosphatase: 78 U/L (ref 39–117)
BUN: 15 mg/dL (ref 6–23)
CO2: 30 mEq/L (ref 19–32)
Calcium: 9.3 mg/dL (ref 8.4–10.5)
Chloride: 105 mEq/L (ref 96–112)
Creatinine, Ser: 0.75 mg/dL (ref 0.40–1.20)
GFR: 76.63 mL/min (ref >60.00)
Glucose, Bld: 88 mg/dL (ref 70–99)
Potassium: 4.7 mEq/L (ref 3.5–5.1)
Sodium: 141 mEq/L (ref 135–145)
Total Bilirubin: 0.7 mg/dL (ref 0.2–1.2)
Total Protein: 6.3 g/dL (ref 6.0–8.3)

## 2020-10-12 MED ORDER — FLUTICASONE PROPIONATE 50 MCG/ACT NA SUSP: 2.0000 | Freq: Every day | NASAL | 4 refills | Status: DC | PRN

## 2020-10-12 NOTE — Progress Notes (Signed)
Patient scheduled while in office.

## 2020-10-12 NOTE — Patient Instructions (Signed)
Ultrasound of pelvis Let us know if you dont hear back within a week in regards to an appointment being scheduled.

## 2020-10-12 NOTE — Assessment & Plan Note (Signed)
Asymptomatic.  Pending US pelvic.

## 2020-10-12 NOTE — Assessment & Plan Note (Signed)
Chronic, stable. Continue crestor 5mg 

## 2020-10-12 NOTE — Progress Notes (Signed)
Subjective:    Patient ID: Michelle [name], female    DOB: 07/25/1940, 80 y.o.   MRN: [id number]  CC: Michelle [name] is a 80 y.o. female who presents today for follow up.   HPI: HLD and known atherosclerosis as seen CT chest and abdomen 11/ 2021  2.8 cm left adnexal cystic lesion measured previously now has 2.6 cm.  No abdominal pain, abdominal bloating or changes to urination.   Compliant with crestor 5mg   Balance has improved with PT. She is continuing therapy until month end.   SOB has improved. No cp.  Consult with pulmonology last month regarding DOE, OSA. Pulmonary fibrosis. Pending PFTs Follow up with Dr Mortimer Fries next month.     HISTORY:  Past Medical History:  Diagnosis Date  . Arthritis   . Collagen vascular disease ([place])   . GERD (gastroesophageal reflux disease)   . Headache(784.0)   . History of methotrexate therapy   . Hyperlipidemia    hx  . Lymphadenopathy of head and neck 01/2015   see on Thyroid ultrasound  . 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 Personal history of chemotherapy   . Rheumatoid arthritis [facility])    Past Surgical History:  Procedure Laterality Date  . CARDIAC CATHETERIZATION  09/2004   [facility]; EF 60%  . CARDIAC CATHETERIZATION  08/2004   [facility]  . IR FLUORO GUIDED NEEDLE PLC ASPIRATION/INJECTION LOC  06/18/2018  . PERIPHERAL VASCULAR CATHETERIZATION N/A 07/04/2015   Procedure: [name] Insertion;  Surgeon: [name], MD;  Location: [place] [facility];  Service: Cardiovascular;  Laterality: N/A;  . PORTA CATH REMOVAL N/A 06/23/2018   Procedure: PORTA CATH REMOVAL;  Surgeon: [name], MD;  Location: [place] [facility];  Service: Cardiovascular;  Laterality: N/A;  . RIGHT/LEFT HEART CATH AND CORONARY ANGIOGRAPHY Bilateral 09/20/2019   Procedure: RIGHT/LEFT HEART CATH AND CORONARY ANGIOGRAPHY;  Surgeon: Nelva Bush, MD;  Location: [place] [facility];  Service:  Cardiovascular;  Laterality: Bilateral;   Family History  Problem Relation Age of Onset  . Diabetes Mother   . Cholelithiasis Mother   . Hypertension Sister   . Diabetes Sister   . Heart murmur Sister   . Arthritis Brother   . Breast cancer Neg Hx     Allergies: Patient has no known allergies. Current Outpatient Medications on File Prior to Visit  Medication Sig Dispense Refill  . acetaminophen (TYLENOL) 500 MG tablet Take 1,000 mg by mouth every 6 (six) hours as needed for mild pain.     [name] albuterol (VENTOLIN HFA) 108 (90 Base) MCG/ACT inhaler TAKE 2 PUFFS BY MOUTH EVERY 6 HOURS AS NEEDED FOR WHEEZE OR SHORTNESS OF BREATH 18 each 1  . carvedilol (COREG) 3.125 MG tablet Take 1 tablet (3.125 mg total) by mouth 2 (two) times daily. 180 tablet 1  . cyanocobalamin 2000 MCG tablet Take 2,500 mcg by mouth daily.     . furosemide (LASIX) 40 MG tablet TAKE 1 TABLET BY MOUTH EVERY DAY 90 tablet 0  . losartan (COZAAR) 25 MG tablet TAKE 1 TABLET BY MOUTH EVERY DAY 90 tablet 1  . MEGARED OMEGA-3 KRILL OIL PO Take 1 tablet by mouth daily. 4 in 1    . meloxicam (MOBIC) 7.5 MG tablet Take 7.5 mg by mouth daily as needed for pain.    [name] omeprazole (PRILOSEC) 40 MG capsule TAKE 1 CAPSULE BY MOUTH EVERY  DAY 90 capsule 1  . rosuvastatin (CRESTOR) 5 MG tablet Take 1 tablet (5 mg total) by mouth daily. 90 tablet 3  . spironolactone (ALDACTONE) 25 MG tablet TAKE 1/2 TABLET BY MOUTH EVERY DAY (Patient taking differently: Take 25 mg by mouth daily.) 45 tablet 3   Current Facility-Administered Medications on File Prior to Visit  Medication Dose Route Frequency Provider Last Rate Last Admin  . heparin lock flush 100 unit/mL  500 Units Intravenous Once Charlaine Dalton R, MD      . sodium chloride flush (NS) 0.9 % injection 10 mL  10 mL Intravenous Once Charlaine Dalton R, MD      . sodium chloride flush (NS) 0.9 % injection 10 mL  10 mL Intravenous Once Cammie Sickle, MD      . Tbo-Filgrastim  [facility]) injection 480 mcg  480 mcg Subcutaneous Once Cammie Sickle, MD        Social History   Tobacco Use  . Smoking status: Never Smoker  . Smokeless tobacco: Never Used  Vaping Use  . Vaping Use: Never used  Substance Use Topics  . Alcohol use: No  . Drug use: No    Review of Systems  Constitutional: Negative for chills and fever.  Respiratory: Negative for cough.   Cardiovascular: Negative for chest pain and palpitations.  Gastrointestinal: Negative for abdominal distention, abdominal pain, nausea and vomiting.  Genitourinary: Negative for pelvic pain.      Objective:    BP 110/70 (BP Location: Left Arm, Patient Position: Sitting, Cuff Size: Large)   Pulse 73   Temp 97.8 F (36.6 C) (Oral)   Ht 5\' 7"  (1.702 m)   Wt 252 lb 12.8 oz (114.7 kg)   SpO2 99%   BMI 39.59 kg/m  BP Readings from Last 3 Encounters:  10/12/20 110/70  09/11/20 112/70  08/24/20 105/69   Wt Readings from Last 3 Encounters:  10/12/20 252 lb 12.8 oz (114.7 kg)  09/11/20 252 lb (114.3 kg)  08/24/20 248 lb (112.5 kg)    Physical Exam Vitals reviewed.  Constitutional:      Appearance: Normal appearance. She is well-developed.  Eyes:     Conjunctiva/sclera: Conjunctivae normal.  Cardiovascular:     Rate and Rhythm: Normal rate and regular rhythm.     Pulses: Normal pulses.     Heart sounds: Normal heart sounds.  Pulmonary:     Effort: Pulmonary effort is normal.     Breath sounds: Normal breath sounds. No wheezing, rhonchi or rales.  Abdominal:     General: Bowel sounds are normal. There is no distension.     Palpations: Abdomen is soft. Abdomen is not rigid. There is no fluid wave or mass.     Tenderness: There is no abdominal tenderness. There is no guarding or rebound.     Comments: obese  Skin:    General: Skin is warm and dry.  Neurological:     Mental Status: She is alert.  Psychiatric:        Speech: Speech normal.        Behavior: Behavior normal.        Thought  Content: Thought content normal.        Assessment & Plan:   Problem List Items Addressed This Visit      Cardiovascular and Mediastinum   Atherosclerosis of aorta (HCC)    Chronic, stable. Continue crestor 5mg       Relevant Orders   Comprehensive metabolic panel   Lipid  panel     Other   Adnexal mass    Asymptomatic.  Pending US pelvic.       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     I am having [name] maintain her cyanocobalamin, acetaminophen, MEGARED OMEGA-3 KRILL OIL PO, albuterol, meloxicam, carvedilol, spironolactone, omeprazole, rosuvastatin, losartan, furosemide, and fluticasone.   Meds ordered this encounter  Medications  . fluticasone (FLONASE) 50 MCG/ACT nasal spray    Sig: Place 2 sprays into both nostrils daily as needed for allergies.    Dispense:  16 g    Refill:  4    Order Specific Question:   Supervising Provider    Answer:   Crecencio Mc [[id number]]    Return precautions given.   Risks, benefits, and alternatives of the medications and treatment plan prescribed today were discussed, and patient expressed understanding.   Education regarding symptom management and diagnosis given to patient on AVS.  Continue to follow with Burnard Hawthorne, FNP for routine health maintenance.   [name] and I agreed with plan.   Mable Paris, FNP

## 2020-10-15 ENCOUNTER — Telehealth: Payer: Self-pay | Admitting: Family

## 2020-10-15 NOTE — Telephone Encounter (Signed)
err

## 2020-10-17 DIAGNOSIS — R262 Difficulty in walking, not elsewhere classified: Secondary | ICD-10-CM | POA: Diagnosis not present

## 2020-10-17 DIAGNOSIS — M799 Soft tissue disorder, unspecified: Secondary | ICD-10-CM | POA: Diagnosis not present

## 2020-10-17 DIAGNOSIS — M6281 Muscle weakness (generalized): Secondary | ICD-10-CM | POA: Diagnosis not present

## 2020-10-17 DIAGNOSIS — M25562 Pain in left knee: Secondary | ICD-10-CM | POA: Diagnosis not present

## 2020-10-17 DIAGNOSIS — M25561 Pain in right knee: Secondary | ICD-10-CM | POA: Diagnosis not present

## 2020-10-19 ENCOUNTER — Encounter: Payer: Self-pay | Admitting: Internal Medicine

## 2020-10-19 DIAGNOSIS — M25561 Pain in right knee: Secondary | ICD-10-CM | POA: Diagnosis not present

## 2020-10-19 DIAGNOSIS — M25562 Pain in left knee: Secondary | ICD-10-CM | POA: Diagnosis not present

## 2020-10-19 DIAGNOSIS — R262 Difficulty in walking, not elsewhere classified: Secondary | ICD-10-CM | POA: Diagnosis not present

## 2020-10-19 DIAGNOSIS — M799 Soft tissue disorder, unspecified: Secondary | ICD-10-CM | POA: Diagnosis not present

## 2020-10-19 DIAGNOSIS — M6281 Muscle weakness (generalized): Secondary | ICD-10-CM | POA: Diagnosis not present

## 2020-10-24 DIAGNOSIS — R262 Difficulty in walking, not elsewhere classified: Secondary | ICD-10-CM | POA: Diagnosis not present

## 2020-10-24 DIAGNOSIS — M25562 Pain in left knee: Secondary | ICD-10-CM | POA: Diagnosis not present

## 2020-10-24 DIAGNOSIS — M25561 Pain in right knee: Secondary | ICD-10-CM | POA: Diagnosis not present

## 2020-10-24 DIAGNOSIS — M799 Soft tissue disorder, unspecified: Secondary | ICD-10-CM | POA: Diagnosis not present

## 2020-10-24 DIAGNOSIS — M6281 Muscle weakness (generalized): Secondary | ICD-10-CM | POA: Diagnosis not present

## 2020-10-26 DIAGNOSIS — M6281 Muscle weakness (generalized): Secondary | ICD-10-CM | POA: Diagnosis not present

## 2020-10-26 DIAGNOSIS — M25561 Pain in right knee: Secondary | ICD-10-CM | POA: Diagnosis not present

## 2020-10-26 DIAGNOSIS — M799 Soft tissue disorder, unspecified: Secondary | ICD-10-CM | POA: Diagnosis not present

## 2020-10-26 DIAGNOSIS — R262 Difficulty in walking, not elsewhere classified: Secondary | ICD-10-CM | POA: Diagnosis not present

## 2020-10-26 DIAGNOSIS — M25562 Pain in left knee: Secondary | ICD-10-CM | POA: Diagnosis not present

## 2020-10-31 ENCOUNTER — Ambulatory Visit
Admission: RE | Admit: 2020-10-31 | Discharge: 2020-10-31 | Disposition: A | Discharge status: Home / Self Care | Payer: Medicare HMO | Source: Ambulatory Visit | Attending: Family | Admitting: Family

## 2020-10-31 ENCOUNTER — Telehealth: Payer: Self-pay | Admitting: *Deleted

## 2020-10-31 ENCOUNTER — Other Ambulatory Visit: Payer: Self-pay

## 2020-10-31 ENCOUNTER — Other Ambulatory Visit: Payer: Self-pay | Admitting: Family

## 2020-10-31 ENCOUNTER — Other Ambulatory Visit: Payer: Self-pay | Admitting: Internal Medicine

## 2020-10-31 ENCOUNTER — Encounter: Payer: Self-pay | Admitting: *Deleted

## 2020-10-31 ENCOUNTER — Telehealth: Payer: Self-pay | Admitting: Internal Medicine

## 2020-10-31 DIAGNOSIS — N6489 Other specified disorders of breast: Secondary | ICD-10-CM | POA: Diagnosis not present

## 2020-10-31 DIAGNOSIS — N6320 Unspecified lump in the left breast, unspecified quadrant: Secondary | ICD-10-CM | POA: Insufficient documentation

## 2020-10-31 DIAGNOSIS — C8311 Mantle cell lymphoma, lymph nodes of head, face, and neck: Secondary | ICD-10-CM

## 2020-10-31 DIAGNOSIS — Z1231 Encounter for screening mammogram for malignant neoplasm of breast: Secondary | ICD-10-CM | POA: Insufficient documentation

## 2020-10-31 DIAGNOSIS — Z13820 Encounter for screening for osteoporosis: Secondary | ICD-10-CM | POA: Diagnosis not present

## 2020-10-31 DIAGNOSIS — Z780 Asymptomatic menopausal state: Secondary | ICD-10-CM | POA: Insufficient documentation

## 2020-10-31 DIAGNOSIS — Z8572 Personal history of non-Hodgkin lymphomas: Secondary | ICD-10-CM | POA: Insufficient documentation

## 2020-10-31 DIAGNOSIS — R590 Localized enlarged lymph nodes: Secondary | ICD-10-CM | POA: Diagnosis not present

## 2020-10-31 DIAGNOSIS — Z9221 Personal history of antineoplastic chemotherapy: Secondary | ICD-10-CM | POA: Insufficient documentation

## 2020-10-31 DIAGNOSIS — R928 Other abnormal and inconclusive findings on diagnostic imaging of breast: Secondary | ICD-10-CM

## 2020-10-31 DIAGNOSIS — R922 Inconclusive mammogram: Secondary | ICD-10-CM | POA: Diagnosis not present

## 2020-10-31 NOTE — Telephone Encounter (Signed)
On 6/01-spoke to patient regarding results of the mammogram/ultrasound recommend biopsy of the axilla lymph node.  C- schedule appt- MD; in 2 weeks post Bx; Lab- cbc/cmp/LDH-GB

## 2020-10-31 NOTE — Progress Notes (Signed)
Biopsy ordered.  C- please schedule follow up in 2 weeks- post biopsy.MD; labs- cbc/cmp/LDH-Thanks GB

## 2020-10-31 NOTE — Telephone Encounter (Signed)
RN called and informed pt that Dr Rogue Bussing wanted her to have a biopsy of left axillary lymph nodes and he would call to explain in further detail and that Dollene Cleveland from [facility] would be in touch for appointment.  Pt verbalized understanding.

## 2020-11-01 ENCOUNTER — Other Ambulatory Visit: Payer: Self-pay | Admitting: *Deleted

## 2020-11-01 DIAGNOSIS — C8311 Mantle cell lymphoma, lymph nodes of head, face, and neck: Secondary | ICD-10-CM

## 2020-11-02 NOTE — Telephone Encounter (Signed)
Pt is aware of appts 6/3 @ 2:55

## 2020-11-07 ENCOUNTER — Ambulatory Visit
Admission: RE | Admit: 2020-11-07 | Discharge: 2020-11-07 | Disposition: A | Discharge status: Home / Self Care | Payer: Medicare HMO | Source: Ambulatory Visit | Attending: Internal Medicine | Admitting: Internal Medicine

## 2020-11-07 ENCOUNTER — Other Ambulatory Visit: Payer: Self-pay

## 2020-11-07 DIAGNOSIS — R928 Other abnormal and inconclusive findings on diagnostic imaging of breast: Secondary | ICD-10-CM | POA: Diagnosis not present

## 2020-11-07 DIAGNOSIS — R590 Localized enlarged lymph nodes: Secondary | ICD-10-CM | POA: Diagnosis not present

## 2020-11-07 HISTORY — PX: BREAST BIOPSY: SHX20

## 2020-11-13 ENCOUNTER — Telehealth: Payer: Self-pay | Admitting: *Deleted

## 2020-11-13 ENCOUNTER — Encounter: Payer: Self-pay | Admitting: Internal Medicine

## 2020-11-13 LAB — SURGICAL PATHOLOGY

## 2020-11-13 NOTE — Telephone Encounter (Signed)
Called report  SURGICAL PATHOLOGY SURGICAL PATHOLOGY  CASE: [phone]  PATIENT: Michelle [name]  Surgical Pathology Report      Specimen Submitted:  A. Lymph node, left axilla   Clinical History: Abnormal left axillary lymph node       DIAGNOSIS:  A. LYMPH NODE, LEFT AXILLA; ULTRASOUND-GUIDED BIOPSY:  - CD5+ MONOCLONAL B-CELL POPULATION; COMPATIBLE WITH INVOLVEMENT BY THE  PATIENT'S KNOWN MANTLE CELL LYMPHOMA.   Comment:  Flow cytometry demonstrates a CD5 positive monoclonal B cell population  with kappa light chain restriction, representing 82% of B cells.  These  monoclonal B-cells are positive for CD19 and CD20 with partial CD23  expression and partial/dim CD43 expression.    GROSS DESCRIPTION:  A labeled: Left axilla lymph node  Received: The specimen is received in 2 containers designated as A1 and  A2.  A1 is received fresh in saline soaked gauze and A2 is received in  formalin.  Collection time: 8:59 AM on 11/07/2020  Placed into formalin time: A1: 9:37 AM on 11/07/2020; A2: 8:59 AM on  11/07/2020  Number of needle core biopsy(s): A1: 3 cores; A2: 3 cores  Length: Range from 1 to 2.2 cm  Diameter: 0.2 cm  Description: Received are cores of yellow and pink soft tissue.  A touch  preparation is performed on the cores in A1.  1 of these cores is  submitted for flow cytometry.  The remainder of the specimen is  submitted for routine histology.  Ink: None  Entirely submitted in cassettes 1-3 with 2 cores from A1 in cassette 1  (1 core fragmented into 3 fragments), 2 core from A2 in cassette 2, and  1 core from A2 in cassette 3.   RB 11/07/2020    Final Diagnosis performed by Betsy Pries, MD.   Electronically signed  11/13/2020 12:31:29PM  The electronic signature indicates that the named Attending Pathologist  has evaluated the specimen  Technical component performed at [facility], [address] [place],  [place] [address] Lab: [id number] Dir: Rush Farmer,  MD, MMM   Professional component performed at [facility], [facility], [place], [place], [place] [address] Lab: [phone]  Dir: Dellia Nims. Reuel Derby, MD   Resulting Agency [facility]         Specimen Collected: 11/07/20 09:12 Last Resulted: 11/13/20 12:48

## 2020-11-14 ENCOUNTER — Encounter: Payer: Self-pay | Admitting: Cytopathology

## 2020-11-14 ENCOUNTER — Ambulatory Visit (INDEPENDENT_AMBULATORY_CARE_PROVIDER_SITE_OTHER): Payer: Medicare HMO

## 2020-11-14 VITALS — Ht 67.0 in | Wt 252.0 lb

## 2020-11-14 DIAGNOSIS — Z0000 Encounter for general adult medical examination without abnormal findings: Secondary | ICD-10-CM | POA: Diagnosis not present

## 2020-11-14 NOTE — Patient Instructions (Addendum)
Michelle [name] , Thank you for taking time to come for your Medicare Wellness Visit. I appreciate your ongoing commitment to your health goals. Please review the following plan we discussed and let me know if I can assist you in the future.   These are the goals we discussed:  Goals       Patient Stated     Increase physical activity (pt-stated)      I want to get back to [facility] for exercises and use of stationary bike       Other     Weight (lb) < 250 lb (113.4 kg)      Lose 10lb         This is a list of the screening recommended for you and due dates:  Health Maintenance  Topic Date Due   Tetanus Vaccine  11/13/2019   COVID-19 Vaccine (5 - Booster for Pfizer series) 12/07/2020   Flu Shot  12/31/2020   DEXA scan (bone density measurement)  Completed   Pneumonia vaccines  Completed   Zoster (Shingles) Vaccine  Completed   HPV Vaccine  Aged Out   Hepatitis C Screening: USPSTF Recommendation to screen - Ages 43-79 yo.  Discontinued    Conditions/risks identified: none  Next appointment: Follow up in one year for your annual wellness visit    Preventive Care 65 Years and Older, Female Preventive care refers to lifestyle choices and visits with your health care provider that can promote health and wellness. What does preventive care include? A yearly physical exam. This is also called an annual well check. Dental exams once or twice a year. Routine eye exams. Ask your health care provider how often you should have your eyes checked. Personal lifestyle choices, including: Daily care of your teeth and gums. Regular physical activity. Eating a healthy diet. Avoiding tobacco and drug use. Limiting alcohol use. Practicing safe sex. Taking low-dose aspirin every day. Taking vitamin and mineral supplements as recommended by your health care provider. What happens during an annual well check? The services and screenings done by your health care provider during your annual well  check will depend on your age, overall health, lifestyle risk factors, and family history of disease. Counseling  Your health care provider may ask you questions about your: Alcohol use. Tobacco use. Drug use. Emotional well-being. Home and relationship well-being. Sexual activity. Eating habits. History of falls. Memory and ability to understand (cognition). Work and work Statistician. Reproductive health. Screening  You may have the following tests or measurements: Height, weight, and BMI. Blood pressure. Lipid and cholesterol levels. These may be checked every 5 years, or more frequently if you are over 41 years old. Skin check. Lung cancer screening. You may have this screening every year starting at age 70 if you have a 30-pack-year history of smoking and currently smoke or have quit within the past 15 years. Fecal occult blood test (FOBT) of the stool. You may have this test every year starting at age 65. Flexible sigmoidoscopy or colonoscopy. You may have a sigmoidoscopy every 5 years or a colonoscopy every 10 years starting at age 81. Hepatitis C blood test. Hepatitis B blood test. Sexually transmitted disease (STD) testing. Diabetes screening. This is done by checking your blood sugar (glucose) after you have not eaten for a while (fasting). You may have this done every 1-3 years. Bone density scan. This is done to screen for osteoporosis. You may have this done starting at age 65. Mammogram. This may be  done every 1-2 years. Talk to your health care provider about how often you should have regular mammograms. Talk with your health care provider about your test results, treatment options, and if necessary, the need for more tests. Vaccines  Your health care provider may recommend certain vaccines, such as: Influenza vaccine. This is recommended every year. Tetanus, diphtheria, and acellular pertussis (Tdap, Td) vaccine. You may need a Td booster every 10 years. Zoster  vaccine. You may need this after age 88. Pneumococcal 13-valent conjugate (PCV13) vaccine. One dose is recommended after age 23. Pneumococcal polysaccharide (PPSV23) vaccine. One dose is recommended after age 37. Talk to your health care provider about which screenings and vaccines you need and how often you need them. This information is not intended to replace advice given to you by your health care provider. Make sure you discuss any questions you have with your health care provider. Document Released: 06/15/2015 Document Revised: 02/06/2016 Document Reviewed: 03/20/2015 Elsevier Interactive Patient Education  2017 [place] Prevention in the Home Falls can cause injuries. They can happen to people of all ages. There are many things you can do to make your home safe and to help prevent falls. What can I do on the outside of my home? Regularly fix the edges of walkways and driveways and fix any cracks. Remove anything that might make you trip as you walk through a door, such as a raised step or threshold. Trim any bushes or trees on the path to your home. Use bright outdoor lighting. Clear any walking paths of anything that might make someone trip, such as rocks or tools. Regularly check to see if handrails are loose or broken. Make sure that both sides of any steps have handrails. Any raised decks and porches should have guardrails on the edges. Have any leaves, snow, or ice cleared regularly. Use sand or salt on walking paths during winter. Clean up any spills in your garage right away. This includes oil or grease spills. What can I do in the bathroom? Use night lights. Install grab bars by the toilet and in the tub and shower. Do not use towel bars as grab bars. Use non-skid mats or decals in the tub or shower. If you need to sit down in the shower, use a plastic, non-slip stool. Keep the floor dry. Clean up any water that spills on the floor as soon as it happens. Remove  soap buildup in the tub or shower regularly. Attach bath mats securely with double-sided non-slip rug tape. Do not have throw rugs and other things on the floor that can make you trip. What can I do in the bedroom? Use night lights. Make sure that you have a light by your bed that is easy to reach. Do not use any sheets or blankets that are too big for your bed. They should not hang down onto the floor. Have a firm chair that has side arms. You can use this for support while you get dressed. Do not have throw rugs and other things on the floor that can make you trip. What can I do in the kitchen? Clean up any spills right away. Avoid walking on wet floors. Keep items that you use a lot in easy-to-reach places. If you need to reach something above you, use a strong step stool that has a grab bar. Keep electrical cords out of the way. Do not use floor polish or wax that makes floors slippery. If you must use wax,  use non-skid floor wax. Do not have throw rugs and other things on the floor that can make you trip. What can I do with my stairs? Do not leave any items on the stairs. Make sure that there are handrails on both sides of the stairs and use them. Fix handrails that are broken or loose. Make sure that handrails are as long as the stairways. Check any carpeting to make sure that it is firmly attached to the stairs. Fix any carpet that is loose or worn. Avoid having throw rugs at the top or bottom of the stairs. If you do have throw rugs, attach them to the floor with carpet tape. Make sure that you have a light switch at the top of the stairs and the bottom of the stairs. If you do not have them, ask someone to add them for you. What else can I do to help prevent falls? Wear shoes that: Do not have high heels. Have rubber bottoms. Are comfortable and fit you well. Are closed at the toe. Do not wear sandals. If you use a stepladder: Make sure that it is fully opened. Do not climb a  closed stepladder. Make sure that both sides of the stepladder are locked into place. Ask someone to hold it for you, if possible. Clearly mark and make sure that you can see: Any grab bars or handrails. First and last steps. Where the edge of each step is. Use tools that help you move around (mobility aids) if they are needed. These include: Canes. Walkers. Scooters. Crutches. Turn on the lights when you go into a dark area. Replace any light bulbs as soon as they burn out. Set up your furniture so you have a clear path. Avoid moving your furniture around. If any of your floors are uneven, fix them. If there are any pets around you, be aware of where they are. Review your medicines with your doctor. Some medicines can make you feel dizzy. This can increase your chance of falling. Ask your doctor what other things that you can do to help prevent falls. This information is not intended to replace advice given to you by your health care provider. Make sure you discuss any questions you have with your health care provider. Document Released: 03/15/2009 Document Revised: 10/25/2015 Document Reviewed: 06/23/2014 Elsevier Interactive Patient Education  2017 [employer].

## 2020-11-14 NOTE — Progress Notes (Addendum)
Subjective:   Michelle [name] is a 80 y.o. female who presents for Medicare Annual (Subsequent) preventive examination.  Review of Systems    No ROS.  Medicare Wellness Virtual Visit.  Visual/audio telehealth visit, UTA vital signs.   See social history for additional risk factors.   Cardiac Risk Factors include: advanced age (>65men, >53 women)     Objective:    Today's Vitals   11/14/20 1034  Weight: 252 lb (114.3 kg)  Height: 5\' 7"  (1.702 m)   Body mass index is 39.47 kg/m.  Advanced Directives 11/14/2020 08/24/2020 04/20/2020 01/20/2020 11/14/2019 09/20/2019 07/22/2019  Does Patient Have a Medical Advance Directive? No No No No No No No  Does patient want to make changes to medical advance directive? - - - - - - -  Would patient like information on creating a medical advance directive? No - Patient declined No - Patient declined No - Patient declined No - Patient declined No - Patient declined Yes (MAU/Ambulatory/Procedural Areas - Information given) No - Patient declined    Current Medications (verified) Outpatient Encounter Medications as of 11/14/2020  Medication Sig   acetaminophen (TYLENOL) 500 MG tablet Take 1,000 mg by mouth every 6 (six) hours as needed for mild pain.    albuterol (VENTOLIN HFA) 108 (90 Base) MCG/ACT inhaler TAKE 2 PUFFS BY MOUTH EVERY 6 HOURS AS NEEDED FOR WHEEZE OR SHORTNESS OF BREATH   carvedilol (COREG) 3.125 MG tablet Take 1 tablet (3.125 mg total) by mouth 2 (two) times daily.   cyanocobalamin 2000 MCG tablet Take 2,500 mcg by mouth daily.    fluticasone (FLONASE) 50 MCG/ACT nasal spray Place 2 sprays into both nostrils daily as needed for allergies.   furosemide (LASIX) 40 MG tablet TAKE 1 TABLET BY MOUTH EVERY DAY   losartan (COZAAR) 25 MG tablet TAKE 1 TABLET BY MOUTH EVERY DAY   MEGARED OMEGA-3 KRILL OIL PO Take 1 tablet by mouth daily. 4 in 1   meloxicam (MOBIC) 7.5 MG tablet Take 7.5 mg by mouth daily as needed for pain.   omeprazole  (PRILOSEC) 40 MG capsule TAKE 1 CAPSULE BY MOUTH EVERY DAY   rosuvastatin (CRESTOR) 5 MG tablet Take 1 tablet (5 mg total) by mouth daily.   spironolactone (ALDACTONE) 25 MG tablet TAKE 1/2 TABLET BY MOUTH EVERY DAY (Patient taking differently: Take 25 mg by mouth daily.)   Facility-Administered Encounter Medications as of 11/14/2020  Medication   heparin lock flush 100 unit/mL   sodium chloride flush (NS) 0.9 % injection 10 mL   sodium chloride flush (NS) 0.9 % injection 10 mL   Tbo-Filgrastim (GRANIX) injection 480 mcg    Allergies (verified) Patient has no known allergies.   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   Personal history of chemotherapy    Rheumatoid arthritis [facility])    Past Surgical History:  Procedure Laterality Date   BREAST BIOPSY Left 11/07/2020   u/s bx-hydromark #3 "coil"-path pending   CARDIAC CATHETERIZATION  09/2004   [place]; EF 60%   CARDIAC CATHETERIZATION  08/2004   [facility]  IR FLUORO GUIDED NEEDLE PLC ASPIRATION/INJECTION LOC  06/18/2018   PERIPHERAL VASCULAR CATHETERIZATION N/A 07/04/2015   Procedure: [name] Insertion;  Surgeon: [name], MD;  Location: [place] [facility];  Service: Cardiovascular;  Laterality: N/A;   PORTA CATH REMOVAL N/A 06/23/2018   Procedure: PORTA CATH REMOVAL;  Surgeon: [name], MD;  Location: [place] [facility];  Service: Cardiovascular;  Laterality: N/A;   RIGHT/LEFT HEART CATH AND CORONARY ANGIOGRAPHY Bilateral 09/20/2019   Procedure: RIGHT/LEFT HEART CATH AND CORONARY ANGIOGRAPHY;  Surgeon: Nelva Bush, MD;  Location: [place] [facility];  Service:  Cardiovascular;  Laterality: Bilateral;   Family History  Problem Relation Age of Onset   Diabetes Mother    Cholelithiasis Mother    Hypertension Sister    Diabetes Sister    Heart murmur Sister    Arthritis Brother    Breast cancer Neg Hx       Tobacco Counseling Counseling given: Not Answered   Clinical Intake:  Pre-visit preparation completed: Yes           How often do you need to have someone help you when you read instructions, pamphlets, or other written materials from your doctor or pharmacy?: 1 - Never   Interpreter Needed?: No      Activities of Daily Living In your present state of health, do you have any difficulty performing the following activities: 11/14/2020  Hearing? N  Vision? N  Difficulty concentrating or making decisions? N  Walking or climbing stairs? Y  Dressing or bathing? N  Doing errands, shopping? N  Preparing Food and eating ? N  Using the Toilet? N  In the past six months, have you accidently leaked urine? N  Do you have problems with loss of bowel control? N  Managing your Medications? N  Managing your Finances? N  Housekeeping or managing your Housekeeping? N  Some recent data might be hidden    Patient Care Team: Burnard Hawthorne, FNP as PCP - General (Family Medicine) End, Harrell Gave, MD as PCP - Cardiology (Cardiology) Deboraha Sprang, MD as PCP - Electrophysiology (Cardiology) Jackolyn Confer, MD (Internal Medicine)  Indicate any recent Medical Services you may have received from other than [facility] providers in the past year (date may be approximate).     Assessment:   This is a routine wellness examination for [place].  I connected with Sparkles today by telephone and verified that I am speaking with the correct person using two identifiers. Location patient: home Location provider: work Persons participating in the virtual visit: patient, Marine scientist.    I discussed the limitations, risks, security and privacy  concerns of performing an evaluation and management service by telephone and the availability of in person appointments. The patient expressed understanding and verbally consented to this telephonic visit.    Interactive audio and video telecommunications were attempted between this provider and patient, however failed, due to patient having technical difficulties OR patient did not have access to video capability.  We continued and completed visit with audio only.  Some vital signs may be absent or patient reported.   Hearing/Vision screen Hearing Screening - Comments:: Patient is able to hear conversational tones without difficulty.  No issues reported.  Vision Screening - Comments:: Wears corrective lenses Visual acuity not assessed, virtual visit.    Dietary issues and exercise activities discussed: Current Exercise Habits: Home exercise routine, Intensity: Mild Healthy diet Good water intake   Goals Addressed  This Visit's Progress    Weight (lb) < 250 lb (113.4 kg)   252 lb (114.3 kg)    Lose 10lb        Depression Screen PHQ 2/9 Scores 11/14/2020 10/12/2020 11/14/2019 11/12/2018 06/28/2018 11/09/2017 01/29/2017  PHQ - 2 Score 0 0 0 0 0 0 0  PHQ- 9 Score - - - - 2 - -    Fall Risk Fall Risk  11/14/2020 11/14/2019 11/12/2018 11/09/2017 01/29/2017  Falls in the past year? 0 0 0 No No  Number falls in past yr: 0 - - - -  Injury with Fall? 0 - - - -  Comment - - - - -  Follow up Falls evaluation completed Falls evaluation completed - - -    FALL RISK PREVENTION PERTAINING TO THE HOME: Handrails in use when climbing stairs? Yes Home free of loose throw rugs in walkways, pet beds, electrical cords, etc? Yes  Adequate lighting in your home to reduce risk of falls? Yes   ASSISTIVE DEVICES UTILIZED TO PREVENT FALLS: Use of a cane, walker or w/c? Yes   TIMED UP AND GO: Was the test performed? No .   Cognitive Function: Patient is alert and oriented x3.  Denies  difficulty focusing, making decisions, memory loss Enjoys reading for brain health.  MMSE/6CIT deferred per patient.  MMSE - Mini Mental State Exam 11/07/2016  Orientation to time 5  Orientation to Place 5  Registration 3  Attention/ Calculation 3  Attention/Calculation-comments Some difficulty calculating   Recall 3  Language- name 2 objects 2  Language- repeat 1  Language- follow 3 step command 3  Language- read & follow direction 1  Write a sentence 1  Copy design 1  Total score 28     6CIT Screen 11/14/2019 11/12/2018 11/09/2017  What Year? 0 points 0 points 0 points  What month? 0 points 0 points 0 points  What time? 0 points 0 points 0 points  Count back from 20 0 points 0 points 0 points  Months in reverse - - 4 points  Repeat phrase - - 0 points  Total Score - - 4    Immunizations Immunization History  Administered Date(s) Administered   Influenza Split 02/18/2012   Influenza, High Dose Seasonal PF 01/29/2017, 02/19/2018, 02/23/2020   Influenza,inj,Quad PF,6+ Mos 04/01/2013, 03/02/2014, 06/25/2015   Influenza-Unspecified 03/14/2016, 02/23/2019   PFIZER(Purple Top)SARS-COV-2 Vaccination 07/18/2019, 08/08/2019, 03/24/2020, 08/07/2020   Pneumococcal Conjugate-13 01/12/2014   Pneumococcal Polysaccharide-23 11/12/2009   Tdap 11/12/2009   Zoster Recombinat (Shingrix) 03/15/2019, 05/20/2019    TDAP status: Due, Education has been provided regarding the importance of this vaccine. Advised may receive this vaccine at local pharmacy or Health Dept. Aware to provide a copy of the vaccination record if obtained from local pharmacy or Health Dept. Verbalized acceptance and understanding. Deferred.   Health Maintenance Health Maintenance  Topic Date Due   TETANUS/TDAP  11/13/2019   COVID-19 Vaccine (5 - Booster for [employer] series) 12/07/2020   INFLUENZA VACCINE  12/31/2020   DEXA SCAN  Completed   PNA vac Low Risk Adult  Completed   Zoster Vaccines- Shingrix  Completed   HPV  VACCINES  Aged Out   Hepatitis C Screening  Discontinued   Colorectal cancer screening: No longer required.   Mammogram status: Completed 10/2020. Repeat every year  Lung Cancer Screening: (Low Dose CT Chest recommended if Age 57-80 years, 30 pack-year currently smoking OR have quit w/in 15years.) does not qualify.   Dental Screening:  Recommended annual dental exams for proper oral hygiene.  Plans to schedule.   Community Resource Referral / Chronic Care Management: CRR required this visit?  No   CCM required this visit?  No      Plan:   Keep all routine maintenance appointments.   I have personally reviewed and noted the following in the patient's chart:   Medical and social history Use of alcohol, tobacco or illicit drugs  Current medications and supplements including opioid prescriptions.  Functional ability and status Nutritional status Physical activity Advanced directives List of other physicians Hospitalizations, surgeries, and ER visits in previous 12 months Vitals Screenings to include cognitive, depression, and falls Referrals and appointments  In addition, I have reviewed and discussed with patient certain preventive protocols, quality metrics, and best practice recommendations. A written personalized care plan for preventive services as well as general preventive health recommendations were provided to patient via mychart.     OBrien-Blaney, Rebeca Valdivia L, LPN   12/01/7791      I have reviewed the above information and agree with above.   Deborra Medina, MD

## 2020-11-19 ENCOUNTER — Encounter: Payer: Self-pay | Admitting: Internal Medicine

## 2020-11-19 ENCOUNTER — Telehealth: Payer: Self-pay

## 2020-11-19 NOTE — Telephone Encounter (Signed)
The patient called back in on my phone and left a message stating I think that she needed to CXL appt on 11/20/20. I tried to call her back but her voice mail box is full and I couldn't leave a message. She hasn't done the PFT or the home sleep study yet.  Not really sure that she will.  I mailed her a letter to call me about scheduling HST and PFT she called when I wasn't here and Suanne Marker took a message for me to call her back on Tuesday. Same thing happened voice mailbox was full and I couldn't leave a message

## 2020-11-19 NOTE — Telephone Encounter (Signed)
ATC pt in regards to sleeping accomodations would like to know if pt is currently using a CPAP.

## 2020-11-20 ENCOUNTER — Ambulatory Visit: Payer: Medicare HMO | Admitting: Internal Medicine

## 2020-11-21 ENCOUNTER — Encounter: Payer: Self-pay | Admitting: Internal Medicine

## 2020-11-21 ENCOUNTER — Telehealth: Payer: Self-pay | Admitting: Pharmacy Technician

## 2020-11-21 ENCOUNTER — Inpatient Hospital Stay: Payer: Medicare HMO | Admitting: Internal Medicine

## 2020-11-21 ENCOUNTER — Telehealth: Payer: Self-pay | Admitting: Pharmacist

## 2020-11-21 ENCOUNTER — Inpatient Hospital Stay: Payer: Medicare HMO | Attending: Internal Medicine

## 2020-11-21 ENCOUNTER — Other Ambulatory Visit: Payer: Self-pay

## 2020-11-21 ENCOUNTER — Other Ambulatory Visit (HOSPITAL_COMMUNITY): Payer: Self-pay

## 2020-11-21 VITALS — BP 122/75 | HR 63 | Temp 97.6°F | Resp 20 | Wt 253.0 lb

## 2020-11-21 DIAGNOSIS — C8311 Mantle cell lymphoma, lymph nodes of head, face, and neck: Secondary | ICD-10-CM

## 2020-11-21 DIAGNOSIS — Z5181 Encounter for therapeutic drug level monitoring: Secondary | ICD-10-CM

## 2020-11-21 DIAGNOSIS — I272 Pulmonary hypertension, unspecified: Secondary | ICD-10-CM | POA: Diagnosis not present

## 2020-11-21 DIAGNOSIS — Z79899 Other long term (current) drug therapy: Secondary | ICD-10-CM | POA: Diagnosis not present

## 2020-11-21 LAB — COMPREHENSIVE METABOLIC PANEL
ALT: 14 U/L (ref 0–44)
AST: 22 U/L (ref 15–41)
Albumin: 3.7 g/dL (ref 3.5–5.0)
Alkaline Phosphatase: 72 U/L (ref 38–126)
Anion gap: 8 (ref 5–15)
BUN: 16 mg/dL (ref 8–23)
CO2: 29 mmol/L (ref 22–32)
Calcium: 9.1 mg/dL (ref 8.9–10.3)
Chloride: 103 mmol/L (ref 98–111)
Creatinine, Ser: 0.78 mg/dL (ref 0.44–1.00)
GFR, Estimated: >60 mL/min (ref >60)
Glucose, Bld: 104 mg/dL — ABNORMAL HIGH (ref 70–99)
Potassium: 4.6 mmol/L (ref 3.5–5.1)
Sodium: 140 mmol/L (ref 135–145)
Total Bilirubin: 0.9 mg/dL (ref 0.3–1.2)
Total Protein: 6.6 g/dL (ref 6.5–8.1)

## 2020-11-21 LAB — CBC WITH DIFFERENTIAL/PLATELET
Abs Immature Granulocytes: 0.03 10*3/uL (ref 0.00–0.07)
Basophils Absolute: 0 10*3/uL (ref 0.0–0.1)
Basophils Relative: 0 %
Eosinophils Absolute: 0.2 10*3/uL (ref 0.0–0.5)
Eosinophils Relative: 3 %
HCT: 40.6 % (ref 36.0–46.0)
Hemoglobin: 13.2 g/dL (ref 12.0–15.0)
Immature Granulocytes: 1 %
Lymphocytes Relative: 19 %
Lymphs Abs: 1.3 10*3/uL (ref 0.7–4.0)
MCH: 29.8 pg (ref 26.0–34.0)
MCHC: 32.5 g/dL (ref 30.0–36.0)
MCV: 91.6 fL (ref 80.0–100.0)
Monocytes Absolute: 0.4 10*3/uL (ref 0.1–1.0)
Monocytes Relative: 6 %
Neutro Abs: 4.7 10*3/uL (ref 1.7–7.7)
Neutrophils Relative %: 71 %
Platelets: 175 10*3/uL (ref 150–400)
RBC: 4.43 MIL/uL (ref 3.87–5.11)
RDW: 14.7 % (ref 11.5–15.5)
WBC: 6.6 10*3/uL (ref 4.0–10.5)
nRBC: 0 % (ref 0.0–0.2)

## 2020-11-21 LAB — LACTATE DEHYDROGENASE: LDH: 175 U/L (ref 98–192)

## 2020-11-21 MED ORDER — IBRUTINIB 420 MG PO TABS: 420.0000 mg | ORAL_TABLET | Freq: Every day | ORAL | 3 refills | Status: DC

## 2020-11-21 MED ORDER — IBRUTINIB 420 MG PO TABS
420.0000 mg | ORAL_TABLET | Freq: Every day | ORAL | 3 refills | Status: DC
  Filled 2020-11-21: qty 28, fill #0

## 2020-11-21 NOTE — Telephone Encounter (Signed)
Oral Oncology Patient Advocate Encounter   Received notification from [facility] that prior authorization for Imbruvica is required.   PA submitted on CoverMyMeds Key [id number] Status is pending   Oral Oncology Clinic will continue to follow.  [place] Patient Albert Phone [phone] Fax [phone] 11/21/2020 3:54 PM

## 2020-11-21 NOTE — Progress Notes (Signed)
[place] OFFICE PROGRESS NOTE  Patient Care Team: Burnard Hawthorne, FNP as PCP - General (Family Medicine) End, Harrell Gave, MD as PCP - Cardiology (Cardiology) Deboraha Sprang, MD as PCP - Electrophysiology (Cardiology) Jackolyn Confer, MD (Internal Medicine)  Cancer Staging No matching staging information was found for the patient.   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 Feb 9th- START Benda-Ritux with neulasta; Prolonged neutropenia; DISCONT- Benda-Ritux;   # April 13 th 2017- START R-CHOP x1; severe/prolonged neutropenia; PET- CR; BMBx-Neg; Disc R-CHOP  # 26th MAY 2017- Start Rituxan q 35M Main OCT 12th 2017- PET NED.  Stop Rituxan maintenance x 2 years [aug 2019-pneumonia]  # June 2022- DIAGNOSIS: thickened cortex of 12 mm. An additional lymph node demonstrates a thickened cortex of 7 mm. A third lymph node demonstrates a mildly thickened cortex of 4 mm A. LYMPH NODE, LEFT AXILLA; ULTRASOUND-GUIDED BIOPSY:  - CD5+ MONOCLONAL B-CELL POPULATION; COMPATIBLE WITH INVOLVEMENT BY THE  PATIENT'S KNOWN MANTLE CELL LYMPHOMA.   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  female pleasant patient above history of mantle cell lymphoma currently on surveillance is here for follow-up/is here for follow-up.  Interim patient had mammogram ultrasound that showed left axillar adenopathy concerning for recurrent mantle cell lymphoma.  Patient underwent a biopsy positive for  recurrent mantle cell lymphoma.  Patient denies any worsening lumps or bumps that she noted.  Denies any headaches.  Denies any nausea vomiting.  No fever chills.  No weight loss.  Breathing is improved-while on cardiac medications.  No leg swelling.  Review of Systems  Constitutional:  Negative for chills, diaphoresis, fever, malaise/fatigue and weight loss.  HENT:  Negative for nosebleeds and sore throat.   Eyes:  Negative for double vision.  Respiratory:  Negative for hemoptysis, sputum production and wheezing.   Cardiovascular:  Negative for chest pain, palpitations, orthopnea and leg swelling.  Gastrointestinal:  Negative for abdominal pain, blood in stool, constipation, diarrhea, heartburn, melena, nausea and vomiting.  Genitourinary:  Negative for dysuria, frequency and urgency.  Musculoskeletal:  Positive for back pain and joint pain.  Skin: Negative.  Negative for itching and rash.  Neurological:  Negative for dizziness, tingling, focal weakness, weakness and headaches.  Endo/Heme/Allergies:  Does not bruise/bleed easily.  Psychiatric/Behavioral:  Negative for depression. The patient is not nervous/anxious and does not have insomnia.    PAST MEDICAL HISTORY :  Past Medical History:  Diagnosis Date   Arthritis    Collagen vascular disease ([place])    GERD (gastroesophageal reflux disease)    Headache(784.0)    History of methotrexate therapy    Hyperlipidemia    hx   Lymphadenopathy of head and neck 01/2015   see on Thyroid ultrasound   Lymphoma, mantle cell ([place]) 06/01/2015   bx of lymph node in right breast/Stage IV Mantle Cell Lymphoma   Personal history of chemotherapy    Rheumatoid arthritis ([place])     PAST SURGICAL HISTORY :   Past Surgical  History:  Procedure Laterality Date   BREAST BIOPSY Left 11/07/2020   u/s bx-hydromark #3 "coil"-path pending   CARDIAC CATHETERIZATION  09/2004   [facility]; EF 60%   CARDIAC CATHETERIZATION  08/2004   [facility]   IR FLUORO GUIDED  NEEDLE PLC ASPIRATION/INJECTION LOC  06/18/2018   PERIPHERAL VASCULAR CATHETERIZATION N/A 07/04/2015   Procedure: [name] Insertion;  Surgeon: [name], MD;  Location: [facility];  Service: Cardiovascular;  Laterality: N/A;   PORTA CATH REMOVAL N/A 06/23/2018   Procedure: PORTA CATH REMOVAL;  Surgeon: [name], MD;  Location: [place];  Service: Cardiovascular;  Laterality: N/A;   RIGHT/LEFT HEART CATH AND CORONARY ANGIOGRAPHY Bilateral 09/20/2019   Procedure: RIGHT/LEFT HEART CATH AND CORONARY ANGIOGRAPHY;  Surgeon: Nelva Bush, MD;  Location: [facility];  Service: Cardiovascular;  Laterality: Bilateral;    FAMILY HISTORY :   Family History  Problem Relation Age of Onset   Diabetes Mother    Cholelithiasis Mother    Hypertension Sister    Diabetes Sister    Heart murmur Sister    Arthritis Brother    Breast cancer Neg Hx     SOCIAL HISTORY:   Social History   Tobacco Use   Smoking status: Never   Smokeless tobacco: Never  Vaping Use   Vaping Use: Never used  Substance Use Topics   Alcohol use: No   Drug use: No    ALLERGIES:  has No Known Allergies.  MEDICATIONS:  Current Outpatient Medications  Medication Sig Dispense Refill   acetaminophen (TYLENOL) 500 MG tablet Take 1,000 mg by mouth every 6 (six) hours as needed for mild pain.      albuterol (VENTOLIN HFA) 108 (90 Base) MCG/ACT inhaler TAKE 2 PUFFS BY MOUTH EVERY 6 HOURS AS NEEDED FOR WHEEZE OR SHORTNESS OF BREATH 18 each 1   carvedilol (COREG) 3.125 MG tablet Take 1 tablet (3.125 mg total) by mouth 2 (two) times daily. 180 tablet 1   cyanocobalamin 2000 MCG tablet Take 2,500 mcg by mouth daily.      fluticasone (FLONASE) 50 MCG/ACT nasal spray Place 2 sprays into both nostrils daily as needed for allergies. 16 g 4   furosemide (LASIX) 40 MG tablet TAKE 1 TABLET BY MOUTH EVERY DAY 90 tablet 0   ibrutinib 420 MG TABS Take 420 mg by mouth daily. 30 tablet 3   losartan  (COZAAR) 25 MG tablet TAKE 1 TABLET BY MOUTH EVERY DAY 90 tablet 1   MEGARED OMEGA-3 KRILL OIL PO Take 1 tablet by mouth daily.     meloxicam (MOBIC) 7.5 MG tablet Take 7.5 mg by mouth daily as needed for pain.     omeprazole (PRILOSEC) 40 MG capsule TAKE 1 CAPSULE BY MOUTH EVERY DAY 90 capsule 1   rosuvastatin (CRESTOR) 5 MG tablet Take 1 tablet (5 mg total) by mouth daily. 90 tablet 3   spironolactone (ALDACTONE) 25 MG tablet TAKE 1/2 TABLET BY MOUTH EVERY DAY (Patient taking differently: Take 25 mg by mouth daily.) 45 tablet 3   No current facility-administered medications for this visit.   Facility-Administered Medications Ordered in Other Visits  Medication Dose Route Frequency Provider Last Rate Last Admin   heparin lock flush 100 unit/mL  500 Units Intravenous Once Charlaine Dalton R, MD       sodium chloride flush (NS) 0.9 % injection 10 mL  10 mL Intravenous Once Cammie Sickle, MD       sodium  chloride flush (NS) 0.9 % injection 10 mL  10 mL Intravenous Once Cammie Sickle, MD       Tbo-Filgrastim [facility]) injection 480 mcg  480 mcg Subcutaneous Once Cammie Sickle, MD        PHYSICAL EXAMINATION: ECOG PERFORMANCE STATUS: 0 - Asymptomatic  BP 122/75   Pulse 63   Temp 97.6 F (36.4 C) (Tympanic)   Resp 20   Wt 253 lb (114.8 kg)   SpO2 99%   BMI 39.63 kg/m   Filed Weights   11/21/20 0916  Weight: 253 lb (114.8 kg)    Physical Exam Constitutional:      Comments: Obese.  Walking herself with cane.   HENT:     Head: Normocephalic and atraumatic.     Mouth/Throat:     Pharynx: No oropharyngeal exudate.  Eyes:     Pupils: Pupils are equal, round, and reactive to light.  Cardiovascular:     Rate and Rhythm: Normal rate and regular rhythm.  Pulmonary:     Effort: No respiratory distress.     Breath sounds: No wheezing.  Abdominal:     General: Bowel sounds are normal. There is no distension.     Palpations: Abdomen is soft. There is no mass.      Tenderness: no abdominal tenderness There is no guarding or rebound.  Musculoskeletal:        General: No tenderness. Normal range of motion.     Cervical back: Normal range of motion and neck supple.  Skin:    General: Skin is warm.  Neurological:     Mental Status: She is alert and oriented to person, place, and time.  Psychiatric:        Mood and Affect: Affect normal.   LABORATORY DATA:  I have reviewed the data as listed    Component Value Date/Time   NA 140 11/21/2020 0855   NA 145 (H) 09/08/2019 1359   NA 142 09/14/2013 1127   K 4.6 11/21/2020 0855   K 3.5 09/14/2013 1127   CL 103 11/21/2020 0855   CL 110 (H) 09/14/2013 1127   CO2 29 11/21/2020 0855   CO2 29 09/14/2013 1127   GLUCOSE 104 (H) 11/21/2020 0855   GLUCOSE 106 (H) 09/14/2013 1127   BUN 16 11/21/2020 0855   BUN 15 09/08/2019 1359   BUN 8 09/14/2013 1127   CREATININE 0.78 11/21/2020 0855   CREATININE 0.71 09/14/2013 1127   CREATININE 0.68 04/01/2013 1550   CALCIUM 9.1 11/21/2020 0855   CALCIUM 8.6 09/14/2013 1127   PROT 6.6 11/21/2020 0855   PROT 7.6 09/14/2013 1127   ALBUMIN 3.7 11/21/2020 0855   ALBUMIN 3.2 (L) 09/14/2013 1127   AST 22 11/21/2020 0855   AST 22 09/14/2013 1127   ALT 14 11/21/2020 0855   ALT 19 09/14/2013 1127   ALKPHOS 72 11/21/2020 0855   ALKPHOS 76 09/14/2013 1127   BILITOT 0.9 11/21/2020 0855   BILITOT 0.4 09/14/2013 1127   GFRNONAA >60 11/21/2020 0855   GFRNONAA >60 09/14/2013 1127   GFRAA >60 01/20/2020 1303   GFRAA >60 09/14/2013 1127    No results found for: SPEP, UPEP  Lab Results  Component Value Date   WBC 6.6 11/21/2020   NEUTROABS 4.7 11/21/2020   HGB 13.2 11/21/2020   HCT 40.6 11/21/2020   MCV 91.6 11/21/2020   PLT 175 11/21/2020      Chemistry      Component Value Date/Time   NA 140 11/21/2020  0855   NA 145 (H) 09/08/2019 1359   NA 142 09/14/2013 1127   K 4.6 11/21/2020 0855   K 3.5 09/14/2013 1127   CL 103 11/21/2020 0855   CL 110 (H)  09/14/2013 1127   CO2 29 11/21/2020 0855   CO2 29 09/14/2013 1127   BUN 16 11/21/2020 0855   BUN 15 09/08/2019 1359   BUN 8 09/14/2013 1127   CREATININE 0.78 11/21/2020 0855   CREATININE 0.71 09/14/2013 1127   CREATININE 0.68 04/01/2013 1550      Component Value Date/Time   CALCIUM 9.1 11/21/2020 0855   CALCIUM 8.6 09/14/2013 1127   ALKPHOS 72 11/21/2020 0855   ALKPHOS 76 09/14/2013 1127   AST 22 11/21/2020 0855   AST 22 09/14/2013 1127   ALT 14 11/21/2020 0855   ALT 19 09/14/2013 1127   BILITOT 0.9 11/21/2020 0855   BILITOT 0.4 09/14/2013 1127       RADIOGRAPHIC STUDIES: I have personally reviewed the radiological images as listed and agreed with the findings in the report. No results found.   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  June 2022-ultrasound axilla up to 1.5 cm lymph nodes.  Biopsy positive for mantle cell lymphoma recurrent.  Recommend PET scan ASAP  #Recommend ibrutinib 420 mg dose once a day.  Patient has poor tolerance to chemotherapy given extreme neutropenia needing multiple interruptions with treatments.   #I had a long discussion the patient regarding starting on ibrutinib oral pills given the progression of disease.  Discussed the mechanism of action-targeted therapy.  Also discussed that the goal of treatment is to control the disease but not curative.  Discussed the potential side effects including but not limited to-elevated blood pressure; arrhythmia; joint pains rare risk of infections etc.  However, benefits of the treatment outweigh the risk.  We will initiate a prescription.  Patient agreement to start the treatment.  # Chronic arthritis-NSAIDs as needed. STABLE.   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  Recommend repeating 2D echo for new baseline.  # Pulmonary  hypertension/chronic bilateral lung scarring- [s/p pulmonary evaluation]-improved on albuterol- STABLE.   # # COVID EVUSHELD PROPHYLAXIS: Given the immunosuppressive effects of treatments I would recommend EVUSHELD prophylaxis.  IM injection x2 [same time]-cutdown risk of Covid infection/next 6 months. Patient is vaccinated to [place].  Ms.Causey, [place]-who will speak to the patient.  # DISPOSITION: # Cancel CT scans/ appts in july # PET ASAP # follow up-MD in 1-2 days post PET scan-;.B  # 40 minutes face-to-face with the patient discussing the above plan of care; more than 50% of time spent on prognosis/ natural history; counseling and coordination.      Orders Placed This Encounter  Procedures   NM PET Image Restag (PS) Skull Base To Thigh    Standing Status:   Future    Standing Expiration Date:   11/21/2021    Order Specific Question:   If indicated for the ordered procedure, I authorize the administration of a radiopharmaceutical per Radiology protocol    Answer:   Yes    Order Specific Question:   Preferred imaging location?    Answer:   [place]   Ambulatory Referral for Evusheld    Referral Priority:   Routine    Referral Type:   Consultation    Referral Reason:   Specialty Services  Required    Number of Visits Requested:   1   ECHOCARDIOGRAM COMPLETE    Standing Status:   Future    Standing Expiration Date:   11/21/2021    Order Specific Question:   Where should this test be performed    Answer:   [facility]    Order Specific Question:   Please indicate who you request to read the nuc med / echo results.    Answer:   [facility]    Order Specific Question:   Perflutren DEFINITY (image enhancing agent) should be administered unless hypersensitivity or allergy exist    Answer:   Administer Perflutren    Order Specific Question:   Is a special reader required? (athlete or structural heart)    Answer:   No    Order Specific Question:   Reason for exam-Echo     Answer:   Chemo  Z09   All questions were answered. The patient knows to call the clinic with any problems, questions or concerns.      Cammie Sickle, MD 11/21/2020 11:11 AM

## 2020-11-21 NOTE — Telephone Encounter (Signed)
Oral Oncology Pharmacist Encounter  Received new prescription for Imbruvica (ibrutinib) for the treatment of recurrent stage IV mantle cell lymphoma, planned duration until disease progression or unacceptable drug toxicity.  CMP from 11/21/20 assessed, no relevant lab abnormalities. Prescription dose and frequency assessed. MD started patient on a reduced dose due to hx of cardiac issues.  Current medication list in Epic reviewed, a few DDIs with ibrutinib identified: Meloxicam and Omega-3: Omega-3  amd meloxicam may enhance the antiplatelet effect of Ibrutinib. No baseline dose adjustment needed. Monitoring for evidence of bleeding/bleeding risk.  Evaluated chart and no patient barriers to medication adherence identified.   Prescription has been e-scribed to the [facility] for benefits analysis and approval.  Oral Oncology Clinic will continue to follow for insurance authorization, copayment issues, initial counseling and start date.   Darl Pikes, PharmD, BCPS, BCOP, CPP Hematology/Oncology Clinical Pharmacist Practitioner [facility]/HP/AP [facility] [phone]  11/21/2020 11:43 AM

## 2020-11-21 NOTE — Telephone Encounter (Signed)
Oral Oncology Patient Advocate Encounter  Prior Authorization for Michelle [name] has been approved.    PA# [id number] Effective dates: 06/02/20 through 06/01/21  Patients co-pay is $3193.59  Oral Oncology Clinic will continue to follow.   [place] Patient [place] Phone [phone] Fax [phone] 11/21/2020 10:52 PM

## 2020-11-21 NOTE — Assessment & Plan Note (Addendum)
#  Mantle cell lymphoma stage IV-currently on surveillance [last Rituxan August 2019].  June 2022-ultrasound axilla up to 1.5 cm lymph nodes.  Biopsy positive for mantle cell lymphoma recurrent.  Recommend PET scan ASAP  #Recommend ibrutinib 420 mg dose once a day.  Patient has poor tolerance to chemotherapy given extreme neutropenia needing multiple interruptions with treatments.   #I had a long discussion the patient regarding starting on ibrutinib oral pills given the progression of disease.  Discussed the mechanism of action-targeted therapy.  Also discussed that the goal of treatment is to control the disease but not curative.  Discussed the potential side effects including but not limited to-elevated blood pressure; arrhythmia; joint pains rare risk of infections etc.  However, benefits of the treatment outweigh the risk.  We will initiate a prescription.  Patient agreement to start the treatment.  # Chronic arthritis-NSAIDs as needed. STABLE.   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  Recommend repeating 2D echo for new baseline.  # Pulmonary hypertension/chronic bilateral lung scarring- [s/p pulmonary evaluation]-improved on albuterol- STABLE.   # # COVID EVUSHELD PROPHYLAXIS: Given the immunosuppressive effects of treatments I would recommend EVUSHELD prophylaxis.  IM injection x2 [same time]-cutdown risk of Covid infection/next 6 months. Patient is vaccinated to [place].  Ms.Causey, [place]-who will speak to the patient.  # DISPOSITION: # Cancel CT scans/ appts in july # PET ASAP # follow up-MD in 1-2 days post PET scan-;.B  # 40 minutes face-to-face with the patient discussing the above plan of care; more than 50% of time spent on prognosis/ natural history; counseling and coordination.

## 2020-11-22 ENCOUNTER — Telehealth: Payer: Self-pay | Admitting: *Deleted

## 2020-11-22 ENCOUNTER — Other Ambulatory Visit: Payer: Self-pay | Admitting: Physician Assistant

## 2020-11-22 ENCOUNTER — Telehealth: Payer: Self-pay | Admitting: Pharmacist

## 2020-11-22 ENCOUNTER — Other Ambulatory Visit (HOSPITAL_COMMUNITY): Payer: Self-pay

## 2020-11-22 NOTE — Telephone Encounter (Signed)
Apts for pet scan and md apts have been made

## 2020-11-22 NOTE — Telephone Encounter (Signed)
Oral Chemotherapy Pharmacist Encounter   Attempted to call patient to discuss the need for manufacturer assistance. She will need to come in to sign paperwork. Will continue to attempted to reach Michelle [name].  Anticipated start date of 12/10/20, if given the go ahead during her MD visit on that day.   Darl Pikes, PharmD, BCPS, BCOP, CPP Hematology/Oncology Clinical Pharmacist [facility]/AP [facility] [phone]  11/22/2020 3:55 PM

## 2020-11-23 ENCOUNTER — Other Ambulatory Visit (HOSPITAL_COMMUNITY): Payer: Self-pay

## 2020-11-23 ENCOUNTER — Ambulatory Visit
Admission: RE | Admit: 2020-11-23 | Discharge: 2020-11-23 | Disposition: A | Discharge status: Home / Self Care | Payer: Medicare HMO | Source: Ambulatory Visit | Attending: Family | Admitting: Family

## 2020-11-23 ENCOUNTER — Other Ambulatory Visit: Payer: Self-pay

## 2020-11-23 DIAGNOSIS — Z8572 Personal history of non-Hodgkin lymphomas: Secondary | ICD-10-CM | POA: Diagnosis not present

## 2020-11-23 DIAGNOSIS — N8500 Endometrial hyperplasia, unspecified: Secondary | ICD-10-CM | POA: Diagnosis not present

## 2020-11-23 DIAGNOSIS — N9489 Other specified conditions associated with female genital organs and menstrual cycle: Secondary | ICD-10-CM | POA: Insufficient documentation

## 2020-11-23 DIAGNOSIS — N83202 Unspecified ovarian cyst, left side: Secondary | ICD-10-CM | POA: Diagnosis not present

## 2020-11-23 DIAGNOSIS — D251 Intramural leiomyoma of uterus: Secondary | ICD-10-CM | POA: Diagnosis not present

## 2020-11-26 ENCOUNTER — Other Ambulatory Visit: Payer: Self-pay | Admitting: Family

## 2020-11-26 DIAGNOSIS — N838 Other noninflammatory disorders of ovary, fallopian tube and broad ligament: Secondary | ICD-10-CM

## 2020-11-27 ENCOUNTER — Telehealth: Payer: Self-pay | Admitting: Pharmacy Technician

## 2020-11-27 ENCOUNTER — Encounter: Payer: Self-pay | Admitting: Internal Medicine

## 2020-11-27 ENCOUNTER — Telehealth: Payer: Self-pay | Admitting: Adult Health

## 2020-11-27 NOTE — Telephone Encounter (Signed)
Oral Oncology Patient Advocate Encounter  Called patient to explain insurance and copay for Imbruvica.  Due to high copay cost and no grant funding available patient would like to complete application for [employer] and [employer] ([employer]) in an effort to reduce patient's out of pocket expense for Imbruvica to $0.    Application completed and faxed to [phone] on 11/29/20.   [place] Emer[place] and [place] ([employer]) phone number for follow up is [phone].   This encounter will be updated until final determination.   [place] Patient [place] Phone [phone] Fax [phone] 11/30/2020 8:42 AM]

## 2020-11-27 NOTE — Telephone Encounter (Signed)
Attempted to call patient about Evusheld for COVID 19 prevention.  Could not get through to patient.  Will send my chart message.    Wilber Bihari, NP

## 2020-11-29 ENCOUNTER — Other Ambulatory Visit: Payer: Self-pay

## 2020-11-29 ENCOUNTER — Ambulatory Visit: Payer: Medicare HMO | Admitting: Primary Care

## 2020-11-29 DIAGNOSIS — G4719 Other hypersomnia: Secondary | ICD-10-CM

## 2020-11-29 DIAGNOSIS — G4733 Obstructive sleep apnea (adult) (pediatric): Secondary | ICD-10-CM | POA: Diagnosis not present

## 2020-11-30 NOTE — Telephone Encounter (Signed)
Oral Oncology Patient Advocate Encounter  Received notification from [place] and [place] ([employer]) that patient has been successfully enrolled into their program to receive Imbruvica from the manufacturer at $0 out of pocket until 06/01/21.    I will call the patient to let her know of the approval and that she will need to reapply for next year.   Specialty Pharmacy that will dispense medication is [facility].  Patient knows to call the office with questions or concerns.   Oral Oncology Clinic will continue to follow.  [place] Patient [place] Phone [phone] Fax [phone] 11/30/2020 8:42 AM

## 2020-11-30 NOTE — Progress Notes (Signed)
aplha

## 2020-12-04 DIAGNOSIS — G4733 Obstructive sleep apnea (adult) (pediatric): Secondary | ICD-10-CM

## 2020-12-06 ENCOUNTER — Other Ambulatory Visit: Payer: Self-pay

## 2020-12-06 ENCOUNTER — Ambulatory Visit
Admission: RE | Admit: 2020-12-06 | Discharge: 2020-12-06 | Disposition: A | Discharge status: Home / Self Care | Payer: Medicare HMO | Source: Ambulatory Visit | Attending: Internal Medicine | Admitting: Internal Medicine

## 2020-12-06 DIAGNOSIS — Z0189 Encounter for other specified special examinations: Secondary | ICD-10-CM | POA: Diagnosis not present

## 2020-12-06 DIAGNOSIS — Z79899 Other long term (current) drug therapy: Secondary | ICD-10-CM | POA: Diagnosis not present

## 2020-12-06 DIAGNOSIS — Z5181 Encounter for therapeutic drug level monitoring: Secondary | ICD-10-CM | POA: Diagnosis not present

## 2020-12-06 DIAGNOSIS — I071 Rheumatic tricuspid insufficiency: Secondary | ICD-10-CM | POA: Diagnosis not present

## 2020-12-06 DIAGNOSIS — C8311 Mantle cell lymphoma, lymph nodes of head, face, and neck: Secondary | ICD-10-CM | POA: Diagnosis not present

## 2020-12-06 LAB — ECHOCARDIOGRAM COMPLETE
AR max vel: 1.62 cm2
AV Area VTI: 1.8 cm2
AV Area mean vel: 1.76 cm2
AV Mean grad: 7 mmHg
AV Peak grad: 13.5 mmHg
Ao pk vel: 1.84 m/s
Area-P 1/2: 2.72 cm2
MV VTI: 2.73 cm2
S' Lateral: 3.4 cm

## 2020-12-06 NOTE — Progress Notes (Signed)
*  PRELIMINARY RESULTS* Echocardiogram 2D Echocardiogram has been performed.  Michelle [name] 12/06/2020, 11:39 AM

## 2020-12-07 ENCOUNTER — Telehealth: Payer: Self-pay | Admitting: Primary Care

## 2020-12-07 ENCOUNTER — Ambulatory Visit
Admission: RE | Admit: 2020-12-07 | Discharge: 2020-12-07 | Disposition: A | Discharge status: Home / Self Care | Payer: Medicare HMO | Source: Ambulatory Visit | Attending: Internal Medicine | Admitting: Internal Medicine

## 2020-12-07 ENCOUNTER — Telehealth: Payer: Self-pay

## 2020-12-07 DIAGNOSIS — I071 Rheumatic tricuspid insufficiency: Secondary | ICD-10-CM | POA: Diagnosis not present

## 2020-12-07 DIAGNOSIS — Z79899 Other long term (current) drug therapy: Secondary | ICD-10-CM | POA: Diagnosis not present

## 2020-12-07 DIAGNOSIS — C8311 Mantle cell lymphoma, lymph nodes of head, face, and neck: Secondary | ICD-10-CM

## 2020-12-07 DIAGNOSIS — Z5181 Encounter for therapeutic drug level monitoring: Secondary | ICD-10-CM | POA: Diagnosis not present

## 2020-12-07 DIAGNOSIS — C8310 Mantle cell lymphoma, unspecified site: Secondary | ICD-10-CM | POA: Diagnosis not present

## 2020-12-07 LAB — GLUCOSE, CAPILLARY: Glucose-Capillary: 89 mg/dL (ref 70–99)

## 2020-12-07 MED ORDER — FLUDEOXYGLUCOSE F - 18 (FDG) INJECTION
12.8000 | Freq: Once | INTRAVENOUS | Status: AC | PRN
  Administered 2020-12-07: 12.8 via INTRAVENOUS

## 2020-12-07 NOTE — Telephone Encounter (Signed)
ATC patient-unable to leave vm due to mailbox being full.  Will call back.  

## 2020-12-07 NOTE — Telephone Encounter (Signed)
ATC patient for reminder of covid test--unable to leave message due to mailbox being full.   12/11/2020 between 8-12 [facility]

## 2020-12-07 NOTE — Telephone Encounter (Signed)
Home sleep study 11/29/20 showed mild OSA, AHI 9.6/HR with SpO2 low 86%. Would set her up with a televisit to go over results to discuss treatment options.

## 2020-12-10 ENCOUNTER — Encounter: Payer: Self-pay | Admitting: Internal Medicine

## 2020-12-10 ENCOUNTER — Telehealth: Payer: Self-pay | Admitting: Adult Health

## 2020-12-10 ENCOUNTER — Inpatient Hospital Stay: Payer: Medicare HMO | Admitting: Pharmacist

## 2020-12-10 ENCOUNTER — Other Ambulatory Visit: Payer: Self-pay

## 2020-12-10 ENCOUNTER — Inpatient Hospital Stay: Payer: Medicare HMO | Attending: Internal Medicine | Admitting: Internal Medicine

## 2020-12-10 DIAGNOSIS — Z515 Encounter for palliative care: Secondary | ICD-10-CM | POA: Diagnosis not present

## 2020-12-10 DIAGNOSIS — C8311 Mantle cell lymphoma, lymph nodes of head, face, and neck: Secondary | ICD-10-CM | POA: Diagnosis not present

## 2020-12-10 DIAGNOSIS — I272 Pulmonary hypertension, unspecified: Secondary | ICD-10-CM | POA: Insufficient documentation

## 2020-12-10 DIAGNOSIS — R21 Rash and other nonspecific skin eruption: Secondary | ICD-10-CM | POA: Diagnosis not present

## 2020-12-10 MED ORDER — ONDANSETRON HCL 8 MG PO TABS: ORAL_TABLET | ORAL | 1 refills | Status: DC

## 2020-12-10 NOTE — Telephone Encounter (Signed)
I called patient to discuss Evusheld, a long acting monoclonal antibody injection administered to patients with decreased immune systems or intolerance/allergy to the COVID 19 vaccine as COVID19 prevention.    Unable to reach patient.  Unable to leave Voice mail, her voice mail is full.    Wilber Bihari, NP

## 2020-12-10 NOTE — Assessment & Plan Note (Addendum)
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  #Recommend starting ibrutinib for 20 mg once a day given patient's poor tolerance to chemotherapy [severe prolonged neutropenia in the past].  Discussed the patient/sister that unfortunately the disease cannot be cured only be treated.  However patient potentially could have long "remissions" but not cure.  I again reviewed the potential side effects including but not limited to arthritis; hypertension cardiac arrhythmias etc..  # Chronic arthritis-NSAIDs as needed.  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  Monitor closely on ibrutinib.  # Pulmonary hypertension/chronic bilateral lung scarring- [s/p pulmonary evaluation]-improved on albuterol-stable  # # COVID EVUSHELD PROPHYLAXIS: Proceed with EVUSHELD.   # DISPOSITION: # follow up-MD in  3weeks; MD ;labs- cbc/bmp/ldh;.B  # I reviewed the blood work- with the patient in detail; also reviewed the imaging independently [as summarized above]; and with the patient in detail.   # 40 minutes face-to-face with the patient discussing the above plan of care; more than 50% of time spent on prognosis/ natural history; counseling and coordination.

## 2020-12-10 NOTE — Progress Notes (Signed)
Pt will like to know if she can have another rx for nausea medication. Has a compazine prescription with her but expired in 2018.

## 2020-12-10 NOTE — Telephone Encounter (Signed)
ATC patient x2--unable to leave vm due to mailbox being full. Letter has been mailed to address on file.  Will close encounter per office protocol.

## 2020-12-10 NOTE — Progress Notes (Signed)
[place]  Telephone:([phone] [phone]  Patient Care Team: Burnard Hawthorne, FNP as PCP - General (Family Medicine) End, Harrell Gave, MD as PCP - Cardiology (Cardiology) Deboraha Sprang, MD as PCP - Electrophysiology (Cardiology) Jackolyn Confer, MD (Internal Medicine) Cammie Sickle, MD as Consulting Physician (Internal Medicine)   Name of the patient: Michelle [name]  [id number]  02-23-1941   Date of visit: 12/10/20  HPI: Patient is a 80 y.o. female with recurrent stage IV mantle cell lymphoma. Planned treatment with Imbruvica (ibrutinib).  Reason for Consult: Imbruvica (ibrutinib) oral chemotherapy education.   PAST MEDICAL HISTORY: Past Medical History:  Diagnosis Date   Arthritis    Collagen vascular disease ([place])    GERD (gastroesophageal reflux disease)    Headache(784.0)    History of methotrexate therapy    Hyperlipidemia    hx   Lymphadenopathy of head and neck 01/2015   see on Thyroid ultrasound   Lymphoma, mantle cell ([place]) 06/01/2015   bx of lymph node in right breast/Stage IV Mantle Cell Lymphoma   Personal history of chemotherapy    Rheumatoid arthritis [facility])     HEMATOLOGY/ONCOLOGY HISTORY:  Oncology History Overview Note  # JAN 2017- MANTLE CELL LYMPHOMA STAGE IV; [R Breast LN [place] Core Bx-1.2cm LN/R Ax LN-Bx]; cyclin D Pos; Mitotic rate-LOW; MIPI score [5/intermediate risk]; BMBx-Positive for involvement. Feb 9th- START Benda-Ritux with neulasta; Prolonged neutropenia; DISCONT- Benda-Ritux;   # April 13 th 2017- START R-CHOP x1; severe/prolonged neutropenia; PET- CR; BMBx-Neg; Disc R-CHOP  # 26th MAY 2017- Start Rituxan q 110M Main OCT 12th 2017- PET NED.  Stop Rituxan maintenance x 2 years [aug 2019-pneumonia]  # June 2022- DIAGNOSIS: thickened cortex of 12 mm. An additional lymph node demonstrates a thickened cortex of 7 mm. A third lymph node demonstrates a mildly thickened  cortex of 4 mm A. LYMPH NODE, LEFT AXILLA; ULTRASOUND-GUIDED BIOPSY:  - CD5+ MONOCLONAL B-CELL POPULATION; COMPATIBLE WITH INVOLVEMENT BY THE  PATIENT'S KNOWN MANTLE CELL LYMPHOMA.   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  MEDICATIONS:  Current Outpatient Medications  Medication Sig Dispense Refill   acetaminophen (TYLENOL) 500 MG tablet Take 1,000 mg by mouth every 6 (six) hours as needed for mild pain.      albuterol (VENTOLIN HFA) 108 (90 Base) MCG/ACT inhaler TAKE 2 PUFFS BY MOUTH EVERY 6 HOURS AS NEEDED FOR WHEEZE OR SHORTNESS OF BREATH 18 each 1   carvedilol (COREG) 3.125 MG tablet TAKE 1 TABLET BY MOUTH 2 TIMES DAILY. 180 tablet 0   cyanocobalamin 2000 MCG tablet Take 2,500 mcg by mouth daily.      fluticasone (FLONASE) 50 MCG/ACT nasal spray Place 2 sprays into both nostrils daily as needed for allergies. 16 g 4   furosemide (LASIX) 40 MG tablet TAKE 1 TABLET BY MOUTH EVERY DAY 90 tablet 0   ibrutinib 420 MG TABS Take 1 tablet (420 mg) by mouth daily. 28 tablet 3   losartan (COZAAR) 25 MG tablet TAKE 1 TABLET BY MOUTH EVERY DAY 90 tablet 1   MEGARED OMEGA-3 KRILL OIL PO Take 1 tablet by mouth daily.  meloxicam (MOBIC) 7.5 MG tablet Take 7.5 mg by mouth daily as needed for pain.     Multiple Vitamins-Minerals (CENTRUM SILVER 50+WOMEN) TABS Take by mouth.     Multiple Vitamins-Minerals (HAIR SKIN AND NAILS FORMULA PO) Take by mouth.     omeprazole (PRILOSEC) 40 MG capsule TAKE 1 CAPSULE BY MOUTH EVERY DAY 90 capsule 1   ondansetron (ZOFRAN) 8 MG tablet One pill every 8 hours as needed for nausea/vomitting. 40 tablet 1   rosuvastatin (CRESTOR) 5 MG tablet Take 1  tablet (5 mg total) by mouth daily. 90 tablet 3   spironolactone (ALDACTONE) 25 MG tablet TAKE 1/2 TABLET BY MOUTH EVERY DAY (Patient taking differently: Take 25 mg by mouth daily.) 45 tablet 3   No current facility-administered medications for this visit.   Facility-Administered Medications Ordered in Other Visits  Medication Dose Route Frequency Provider Last Rate Last Admin   heparin lock flush 100 unit/mL  500 Units Intravenous Once Charlaine Dalton R, MD       sodium chloride flush (NS) 0.9 % injection 10 mL  10 mL Intravenous Once Charlaine Dalton R, MD       sodium chloride flush (NS) 0.9 % injection 10 mL  10 mL Intravenous Once Cammie Sickle, MD       Tbo-Filgrastim [facility]) injection 480 mcg  480 mcg Subcutaneous Once Cammie Sickle, MD        VITAL SIGNS: There were no vitals taken for this visit. There were no vitals filed for this visit.  Estimated body mass index is 39.19 kg/m as calculated from the following:   Height as of 11/14/20: 5\' 7"  (1.702 m).   Weight as of an earlier encounter on 12/10/20: 113.5 kg (250 lb 3.2 oz).  LABS: CBC:    Component Value Date/Time   WBC 6.6 11/21/2020 0855   HGB 13.2 11/21/2020 0855   HGB 13.4 09/08/2019 1359   HCT 40.6 11/21/2020 0855   HCT 40.9 09/08/2019 1359   PLT 175 11/21/2020 0855   PLT 194 09/08/2019 1359   MCV 91.6 11/21/2020 0855   MCV 90 09/08/2019 1359   MCV 89 09/14/2013 1127   NEUTROABS 4.7 11/21/2020 0855   NEUTROABS 3.8 09/08/2019 1359   LYMPHSABS 1.3 11/21/2020 0855   LYMPHSABS 1.3 09/08/2019 1359   MONOABS 0.4 11/21/2020 0855   EOSABS 0.2 11/21/2020 0855   EOSABS 0.1 09/08/2019 1359   BASOSABS 0.0 11/21/2020 0855   BASOSABS 0.0 09/08/2019 1359   Comprehensive Metabolic Panel:    Component Value Date/Time   NA 140 11/21/2020 0855   NA 145 (H) 09/08/2019 1359   NA 142 09/14/2013 1127   K 4.6 11/21/2020 0855   K 3.5 09/14/2013 1127   CL 103 11/21/2020 0855   CL 110 (H) 09/14/2013  1127   CO2 29 11/21/2020 0855   CO2 29 09/14/2013 1127   BUN 16 11/21/2020 0855   BUN 15 09/08/2019 1359   BUN 8 09/14/2013 1127   CREATININE 0.78 11/21/2020 0855   CREATININE 0.71 09/14/2013 1127   CREATININE 0.68 04/01/2013 1550   GLUCOSE 104 (H) 11/21/2020 0855   GLUCOSE 106 (H) 09/14/2013 1127   CALCIUM 9.1 11/21/2020 0855   CALCIUM 8.6 09/14/2013 1127   AST 22 11/21/2020 0855   AST 22 09/14/2013 1127   ALT 14 11/21/2020 0855   ALT 19 09/14/2013 1127   ALKPHOS 72 11/21/2020 0855   ALKPHOS 76 09/14/2013 1127   BILITOT 0.9 11/21/2020 0855   BILITOT  0.4 09/14/2013 1127   PROT 6.6 11/21/2020 0855   PROT 7.6 09/14/2013 1127   ALBUMIN 3.7 11/21/2020 0855   ALBUMIN 3.2 (L) 09/14/2013 1127     Present during today's visit: patient and her family member  Assessment and Plan: Start plan: Ms. Vanderhoff will start taking her ibrutinib this evening   Patient Education I spoke with patient for overview of new oral chemotherapy medication: Imbruvica (ibrutinib) for the treatment of recurrent stage IV mantle cell lymphoma, planned duration until disease progression or unacceptable drug toxicity.   Administration: Counseled patient on administration, dosing, side effects, monitoring, drug-food interactions, safe handling, storage, and disposal. Patient will take 1 tablet (420 mg) by mouth daily.  Side Effects: Side effects include but not limited to: fatigue, decreased wbc/hgb/plt, N/V, edema.    Adherence: After discussion with patient no patient barriers to medication adherence identified.  Reviewed with patient importance of keeping a medication schedule and plan for any missed doses.  Ms. Schmutz voiced understanding and appreciation. All questions answered. Medication handout provided.  Provided patient with [facility] phone number. Patient knows to call the office with questions or concerns. Oral [facility] will continue to  follow.  Patient expressed understanding and was in agreement with this plan. She also understands that She can call clinic at any time with any questions, concerns, or complaints.   Medication Access Issues: No issue, enrolled in manufacturer assistance until the end of the year. Medication in hand today. Suggested she call for a refill when she has about 2 weeks left incase there is a delay in medication delivery.   Thank you for allowing me to participate in the care of this patient.   Time Total: 30 mins  Visit consisted of counseling and education on dealing with issues of symptom management in the setting of serious and potentially life-threatening illness.Greater than 50%  of this time was spent counseling and coordinating care related to the above assessment and plan.  Signed by: Darl Pikes, PharmD, BCPS, Salley Slaughter, CPP Hematology/Oncology Clinical Pharmacist Practitioner [facility] [phone]  12/10/2020 4:51 PM

## 2020-12-10 NOTE — Telephone Encounter (Signed)
ATC patient x2--unable to leave vm due to mailbox being full. Will close encounter per office protocol.  

## 2020-12-11 ENCOUNTER — Other Ambulatory Visit
Admission: RE | Admit: 2020-12-11 | Discharge: 2020-12-11 | Disposition: A | Discharge status: Home / Self Care | Payer: Medicare HMO | Source: Ambulatory Visit | Attending: Primary Care | Admitting: Primary Care

## 2020-12-11 ENCOUNTER — Other Ambulatory Visit: Payer: Self-pay

## 2020-12-11 DIAGNOSIS — Z01812 Encounter for preprocedural laboratory examination: Secondary | ICD-10-CM | POA: Diagnosis not present

## 2020-12-11 DIAGNOSIS — Z20822 Contact with and (suspected) exposure to covid-19: Secondary | ICD-10-CM | POA: Insufficient documentation

## 2020-12-11 LAB — SARS CORONAVIRUS 2 (TAT 6-24 HRS): SARS Coronavirus 2: NEGATIVE

## 2020-12-12 ENCOUNTER — Encounter: Payer: Self-pay | Admitting: Internal Medicine

## 2020-12-12 ENCOUNTER — Ambulatory Visit: Payer: Medicare HMO | Attending: Primary Care

## 2020-12-12 DIAGNOSIS — R0602 Shortness of breath: Secondary | ICD-10-CM

## 2020-12-12 MED ORDER — ALBUTEROL SULFATE (2.5 MG/3ML) 0.083% IN NEBU
2.5000 mg | INHALATION_SOLUTION | Freq: Once | RESPIRATORY_TRACT | Status: AC
  Administered 2020-12-12: 2.5 mg via RESPIRATORY_TRACT
  Filled 2020-12-12: qty 3

## 2020-12-12 MED ORDER — MELOXICAM 7.5 MG PO TABS: 7.5000 mg | ORAL_TABLET | Freq: Every day | ORAL | 1 refills | Status: DC | PRN

## 2020-12-12 MED ORDER — SPIRONOLACTONE 25 MG PO TABS: 12.5000 mg | ORAL_TABLET | Freq: Every day | ORAL | 3 refills | Status: DC

## 2020-12-12 NOTE — Progress Notes (Signed)
PFT showed no evidence of obstructive or restrictive lung disease. Follow with Dr. Mortimer Fries as previously recommended

## 2020-12-14 ENCOUNTER — Other Ambulatory Visit: Payer: Self-pay | Admitting: Adult Health

## 2020-12-14 DIAGNOSIS — C8311 Mantle cell lymphoma, lymph nodes of head, face, and neck: Secondary | ICD-10-CM

## 2020-12-14 NOTE — Progress Notes (Addendum)
I connected by phone with [name] on 12/14/2020, 12:49 PM to discuss the potential use of a new treatment, tixagevimab/cilgavimab, for pre-exposure prophylaxis for prevention of coronavirus disease 2019 (COVID-19) caused by the SARS-CoV-2 virus.  This patient is a 80 y.o. female that meets the FDA criteria for Emergency Use Authorization of tixagevimab/cilgavimab for pre-exposure prophylaxis of COVID-19 disease. Pt meets following criteria: Age >12 yr and weight > 40kg Not currently infected with SARS-CoV-2 and has no known recent exposure to an individual infected with SARS-CoV-2 AND Who has moderate to severe immune compromise due to a medical condition or receipt of immunosuppressive medications or treatments and may not mount an adequate immune response to COVID-19 vaccination or  Vaccination with any available COVID-19 vaccine, according to the approved or authorized schedule, is not recommended due to a history of severe adverse reaction (e.g., severe allergic reaction) to a COVID-19 vaccine(s) and/or COVID-19 vaccine component(s).  Patient meets the following definition of mod-severe immune compromised status: 1. Received B-cell depleting therapies (e.g. rituximab, obinutuzumab, ocrelizumab, alemtuzumab) within last 6 months & age > or = 36  I have spoken and communicated the following to the patient or parent/caregiver regarding COVID monoclonal antibody treatment:  FDA has authorized the emergency use of tixagevimab/cilgavimab for the pre-exposure prophylaxis of COVID-19 in patients with moderate-severe immunocompromised status, who meet above EUA criteria.  The significant known and potential risks and benefits of COVID monoclonal antibody, and the extent to which such potential risks and benefits are unknown.  Information on available alternative treatments and the risks and benefits of those alternatives, including clinical trials.  The patient or parent/caregiver has the option  to accept or refuse COVID monoclonal antibody treatment.  After reviewing this information with the patient, agree to receive tixagevimab/cilgavimab.  Patient wishes to receive this on 12/31/2020 after her appt with Dr. Rogue Bussing.   Scot Dock, NP, 12/14/2020, 12:49 PM  Noted margaret arnett, NP

## 2020-12-16 ENCOUNTER — Encounter: Payer: Self-pay | Admitting: Internal Medicine

## 2020-12-16 NOTE — Progress Notes (Signed)
[place] OFFICE PROGRESS NOTE  Patient Care Team: Burnard Hawthorne, FNP as PCP - General (Family Medicine) End, Harrell Gave, MD as PCP - Cardiology (Cardiology) Deboraha Sprang, MD as PCP - Electrophysiology (Cardiology) Jackolyn Confer, MD (Internal Medicine) Cammie Sickle, MD as Consulting Physician (Internal Medicine)  Cancer Staging No matching staging information was found for the patient.   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 Feb 9th- START Benda-Ritux with neulasta; Prolonged neutropenia; DISCONT- Benda-Ritux;   # April 13 th 2017- START R-CHOP x1; severe/prolonged neutropenia; PET- CR; BMBx-Neg; Disc R-CHOP  # 26th MAY 2017- Start Rituxan q 19M Main OCT 12th 2017- PET NED.  Stop Rituxan maintenance x 2 years [aug 2019-pneumonia]  # June 2022- DIAGNOSIS: thickened cortex of 12 mm. An additional lymph node demonstrates a thickened cortex of 7 mm. A third lymph node demonstrates a mildly thickened cortex of 4 mm A. LYMPH NODE, LEFT AXILLA; ULTRASOUND-GUIDED BIOPSY:  - CD5+ MONOCLONAL B-CELL POPULATION; COMPATIBLE WITH INVOLVEMENT BY THE  PATIENT'S KNOWN MANTLE CELL LYMPHOMA.   # Rheumatoid Arthritis [on MXT]; March 2017-MUGA scan-51 % --------------------------------------------------------    DIAGNOSIS: [jan 2017 ] Mantle cell lymphoma  STAGE: 4        ;GOALS: Control  CURRENT/MOST RECENT THERAPY: Surveillaince     Mantle cell lymphoma of lymph nodes of head, face, and neck ([place])      INTERVAL HISTORY:  Michelle [name] 80 y.o.  female pleasant patient above history of recurrent mantle cell lymphoma is here for follow-up/review results of the PET scan/plan proceed with treatment.  Patient denies any nausea vomiting abdominal pain.  Denies any fevers or  chills.  Denies any headaches.  No chest pain.  Patient denies any leg swelling.  Review of Systems  Constitutional:  Negative for chills, diaphoresis, fever, malaise/fatigue and weight loss.  HENT:  Negative for nosebleeds and sore throat.   Eyes:  Negative for double vision.  Respiratory:  Negative for hemoptysis, sputum production and wheezing.   Cardiovascular:  Negative for chest pain, palpitations, orthopnea and leg swelling.  Gastrointestinal:  Negative for abdominal pain, blood in stool, constipation, diarrhea, heartburn, melena, nausea and vomiting.  Genitourinary:  Negative for dysuria, frequency and urgency.  Musculoskeletal:  Positive for back pain and joint pain.  Skin: Negative.  Negative for itching and rash.  Neurological:  Negative for dizziness, tingling, focal weakness, weakness and headaches.  Endo/Heme/Allergies:  Does not bruise/bleed easily.  Psychiatric/Behavioral:  Negative for depression. The patient is not nervous/anxious and does not have insomnia.    PAST MEDICAL HISTORY :  Past Medical History:  Diagnosis Date   Arthritis    Collagen vascular disease ([place])    GERD (gastroesophageal reflux disease)    Headache(784.0)    History of methotrexate therapy    Hyperlipidemia    hx   Lymphadenopathy of head and neck 01/2015   see on Thyroid ultrasound   Lymphoma, mantle cell ([place]) 06/01/2015   bx of lymph node in right breast/Stage IV Mantle Cell Lymphoma   Personal history of chemotherapy    Rheumatoid arthritis ([place])     PAST SURGICAL HISTORY :   Past Surgical History:  Procedure Laterality Date   BREAST BIOPSY Left 11/07/2020   u/s bx-hydromark #3 "coil"-path pending   CARDIAC CATHETERIZATION  09/2004   [place];  EF 60%   CARDIAC CATHETERIZATION  08/2004   [facility]   IR FLUORO GUIDED NEEDLE PLC ASPIRATION/INJECTION LOC  06/18/2018   PERIPHERAL VASCULAR CATHETERIZATION N/A 07/04/2015   Procedure: [name] Insertion;  Surgeon: [name], MD;  Location: [facility];  Service: Cardiovascular;  Laterality: N/A;   PORTA CATH REMOVAL N/A 06/23/2018   Procedure: PORTA CATH REMOVAL;  Surgeon: [name], MD;  Location: [facility];  Service: Cardiovascular;  Laterality: N/A;   RIGHT/LEFT HEART CATH AND CORONARY ANGIOGRAPHY Bilateral 09/20/2019   Procedure: RIGHT/LEFT HEART CATH AND CORONARY ANGIOGRAPHY;  Surgeon: Nelva Bush, MD;  Location: [facility];  Service: Cardiovascular;  Laterality: Bilateral;    FAMILY HISTORY :   Family History  Problem Relation Age of Onset   Diabetes Mother    Cholelithiasis Mother    Hypertension Sister    Diabetes Sister    Heart murmur Sister    Arthritis Brother    Breast cancer Neg Hx     SOCIAL HISTORY:   Social History   Tobacco Use   Smoking status: Never   Smokeless tobacco: Never  Vaping Use   Vaping Use: Never used  Substance Use Topics   Alcohol use: No   Drug use: No    ALLERGIES:  has No Known Allergies.  MEDICATIONS:  Current Outpatient Medications  Medication Sig Dispense Refill   acetaminophen (TYLENOL) 500 MG tablet Take 1,000 mg by mouth every 6 (six) hours as needed for mild pain.      albuterol (VENTOLIN HFA) 108 (90 Base) MCG/ACT inhaler TAKE 2 PUFFS BY MOUTH EVERY 6 HOURS AS NEEDED FOR WHEEZE OR SHORTNESS OF BREATH 18 each 1   carvedilol (COREG) 3.125 MG tablet TAKE 1 TABLET BY MOUTH 2 TIMES DAILY. 180 tablet 0   cyanocobalamin 2000 MCG tablet Take 2,500 mcg by mouth daily.      fluticasone (FLONASE) 50 MCG/ACT nasal spray Place 2 sprays into both nostrils daily as needed for allergies. 16 g 4   furosemide (LASIX) 40 MG tablet TAKE 1 TABLET BY MOUTH EVERY DAY 90 tablet 0   ibrutinib 420 MG TABS Take 1 tablet (420 mg) by mouth daily. 28 tablet 3   losartan (COZAAR) 25 MG tablet TAKE 1 TABLET BY MOUTH EVERY DAY 90 tablet 1   MEGARED OMEGA-3 KRILL OIL PO Take 1 tablet by mouth daily.     Multiple Vitamins-Minerals (CENTRUM SILVER 50+WOMEN) TABS Take  by mouth.     Multiple Vitamins-Minerals (HAIR SKIN AND NAILS FORMULA PO) Take by mouth.     omeprazole (PRILOSEC) 40 MG capsule TAKE 1 CAPSULE BY MOUTH EVERY DAY 90 capsule 1   ondansetron (ZOFRAN) 8 MG tablet One pill every 8 hours as needed for nausea/vomitting. 40 tablet 1   rosuvastatin (CRESTOR) 5 MG tablet Take 1 tablet (5 mg total) by mouth daily. 90 tablet 3   meloxicam (MOBIC) 7.5 MG tablet Take 1 tablet (7.5 mg total) by mouth daily as needed for pain. 30 tablet 1   spironolactone (ALDACTONE) 25 MG tablet Take 0.5 tablets (12.5 mg total) by mouth daily. 45 tablet 3   No current facility-administered medications for this visit.   Facility-Administered Medications Ordered in Other Visits  Medication Dose Route Frequency Provider Last Rate Last Admin   heparin lock flush 100 unit/mL  500 Units Intravenous Once Charlaine Dalton R, MD       sodium chloride flush (NS) 0.9 % injection 10 mL  10 mL Intravenous Once Charlaine Dalton R, MD       sodium chloride flush (NS) 0.9 % injection 10 mL  10 mL Intravenous Once Cammie Sickle, MD       Tbo-Filgrastim [facility]) injection 480 mcg  480 mcg Subcutaneous Once Cammie Sickle, MD        PHYSICAL EXAMINATION: ECOG PERFORMANCE STATUS: 0 - Asymptomatic  BP 110/66   Pulse 65   Temp (!) 97.3 F (36.3 C)   Resp 18   Wt 250 lb 3.2 oz (113.5 kg)   SpO2 99%   BMI 39.19 kg/m   Filed Weights   12/10/20 1517  Weight: 250 lb 3.2 oz (113.5 kg)    Physical Exam Constitutional:      Comments: Obese.  Walking herself with cane.   HENT:     Head: Normocephalic and atraumatic.     Mouth/Throat:     Pharynx: No oropharyngeal exudate.  Eyes:     Pupils: Pupils are equal, round, and reactive to light.  Cardiovascular:     Rate and Rhythm: Normal rate and regular rhythm.  Pulmonary:     Effort: No respiratory distress.     Breath sounds: No wheezing.  Abdominal:     General: Bowel sounds are normal. There is no  distension.     Palpations: Abdomen is soft. There is no mass.     Tenderness: There is no abdominal tenderness. There is no guarding or rebound.  Musculoskeletal:        General: No tenderness. Normal range of motion.     Cervical back: Normal range of motion and neck supple.  Skin:    General: Skin is warm.     Comments: 4 to 5 cm soft mass noted on the left posterior shoulder.   Neurological:     Mental Status: She is alert and oriented to person, place, and time.  Psychiatric:        Mood and Affect: Affect normal.   LABORATORY DATA:  I have reviewed the data as listed    Component Value Date/Time   NA 140 11/21/2020 0855   NA 145 (H) 09/08/2019 1359   NA 142 09/14/2013 1127   K 4.6 11/21/2020 0855   K 3.5 09/14/2013 1127   CL 103 11/21/2020 0855   CL 110 (H) 09/14/2013 1127   CO2 29 11/21/2020 0855   CO2 29 09/14/2013 1127   GLUCOSE 104 (H) 11/21/2020 0855   GLUCOSE 106 (H) 09/14/2013 1127   BUN 16 11/21/2020 0855   BUN 15 09/08/2019 1359   BUN 8 09/14/2013 1127   CREATININE 0.78 11/21/2020 0855   CREATININE 0.71 09/14/2013 1127   CREATININE 0.68 04/01/2013 1550   CALCIUM 9.1 11/21/2020 0855   CALCIUM 8.6 09/14/2013 1127   PROT 6.6 11/21/2020 0855   PROT 7.6 09/14/2013 1127   ALBUMIN 3.7 11/21/2020 0855   ALBUMIN 3.2 (L) 09/14/2013 1127   AST 22 11/21/2020 0855   AST 22 09/14/2013 1127   ALT 14 11/21/2020 0855   ALT 19 09/14/2013 1127   ALKPHOS 72 11/21/2020 0855   ALKPHOS 76 09/14/2013 1127   BILITOT 0.9 11/21/2020 0855   BILITOT 0.4 09/14/2013 1127   GFRNONAA >60 11/21/2020 0855   GFRNONAA >60 09/14/2013 1127   GFRAA >60 01/20/2020 1303   GFRAA >60 09/14/2013 1127    No results found for: SPEP, UPEP  Lab Results  Component Value Date   WBC 6.6 11/21/2020   NEUTROABS 4.7 11/21/2020  HGB 13.2 11/21/2020   HCT 40.6 11/21/2020   MCV 91.6 11/21/2020   PLT 175 11/21/2020      Chemistry      Component Value Date/Time   NA 140 11/21/2020 0855    NA 145 (H) 09/08/2019 1359   NA 142 09/14/2013 1127   K 4.6 11/21/2020 0855   K 3.5 09/14/2013 1127   CL 103 11/21/2020 0855   CL 110 (H) 09/14/2013 1127   CO2 29 11/21/2020 0855   CO2 29 09/14/2013 1127   BUN 16 11/21/2020 0855   BUN 15 09/08/2019 1359   BUN 8 09/14/2013 1127   CREATININE 0.78 11/21/2020 0855   CREATININE 0.71 09/14/2013 1127   CREATININE 0.68 04/01/2013 1550      Component Value Date/Time   CALCIUM 9.1 11/21/2020 0855   CALCIUM 8.6 09/14/2013 1127   ALKPHOS 72 11/21/2020 0855   ALKPHOS 76 09/14/2013 1127   AST 22 11/21/2020 0855   AST 22 09/14/2013 1127   ALT 14 11/21/2020 0855   ALT 19 09/14/2013 1127   BILITOT 0.9 11/21/2020 0855   BILITOT 0.4 09/14/2013 1127       RADIOGRAPHIC STUDIES: I have personally reviewed the radiological images as listed and agreed with the findings in the report. No results found.   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  #Recommend starting ibrutinib for 20 mg once a day given patient's poor tolerance to chemotherapy [severe prolonged neutropenia in the past].  Discussed the patient/sister that unfortunately the disease cannot be cured only be treated.  However patient potentially could have long "remissions" but not cure.  I again reviewed the potential side effects including but not limited to arthritis; hypertension cardiac arrhythmias etc..  # Chronic arthritis-NSAIDs as needed.  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  Monitor closely on ibrutinib.  # Pulmonary hypertension/chronic bilateral  lung scarring- [s/p pulmonary evaluation]-improved on albuterol-stable  # # COVID EVUSHELD PROPHYLAXIS: Proceed with EVUSHELD.   # DISPOSITION: # follow up-MD in  3weeks; MD ;labs- cbc/bmp/ldh;.B  # I reviewed the blood work- with the patient in detail; also reviewed the imaging independently [as summarized above]; and with the patient in detail.   # 40 minutes face-to-face with the patient discussing the above plan of care; more than 50% of time spent on prognosis/ natural history; counseling and coordination.          Orders Placed This Encounter  Procedures   Basic metabolic panel    Standing Status:   Future    Standing Expiration Date:   12/10/2021   All questions were answered. The patient knows to call the clinic with any problems, questions or concerns.      Cammie Sickle, MD 12/16/2020 9:40 PM

## 2020-12-21 ENCOUNTER — Encounter: Payer: Self-pay | Admitting: Obstetrics and Gynecology

## 2020-12-21 ENCOUNTER — Other Ambulatory Visit: Payer: Medicare HMO

## 2020-12-21 ENCOUNTER — Ambulatory Visit: Payer: Medicare HMO | Admitting: Internal Medicine

## 2020-12-21 ENCOUNTER — Telehealth: Payer: Self-pay | Admitting: *Deleted

## 2020-12-21 ENCOUNTER — Encounter: Payer: Medicare HMO | Admitting: Obstetrics and Gynecology

## 2020-12-21 MED ORDER — PREDNISONE 20 MG PO TABS: 60.0000 mg | ORAL_TABLET | Freq: Every day | ORAL | 0 refills | Status: AC

## 2020-12-21 NOTE — Telephone Encounter (Signed)
Spoke with patient-pt c/o rash on face. Concerned that she is having an allergic reaction to ibrutinib.   pt needs to hold ibrutinib; and then need prednisone 60 mg/day- and could beseen early next week re: rash/and predisnone titrate prendioen  MD spoke with patient as well. Script sent to pt's pharmacy for prednisone. Dr. Rogue Bussing asked patient to take benadryl as well. Pt will be evaluated on Monday by Merrily Pew, NP at 115pm..

## 2020-12-24 ENCOUNTER — Inpatient Hospital Stay (HOSPITAL_BASED_OUTPATIENT_CLINIC_OR_DEPARTMENT_OTHER): Payer: Medicare HMO | Admitting: Hospice and Palliative Medicine

## 2020-12-24 ENCOUNTER — Other Ambulatory Visit: Payer: Self-pay

## 2020-12-24 ENCOUNTER — Telehealth: Payer: Self-pay | Admitting: Pharmacist

## 2020-12-24 ENCOUNTER — Encounter: Payer: Self-pay | Admitting: Hospice and Palliative Medicine

## 2020-12-24 VITALS — BP 113/66 | HR 60 | Temp 97.7°F | Resp 18 | Ht 67.0 in | Wt 249.0 lb

## 2020-12-24 DIAGNOSIS — I272 Pulmonary hypertension, unspecified: Secondary | ICD-10-CM | POA: Diagnosis not present

## 2020-12-24 DIAGNOSIS — C8311 Mantle cell lymphoma, lymph nodes of head, face, and neck: Secondary | ICD-10-CM | POA: Diagnosis not present

## 2020-12-24 DIAGNOSIS — Z515 Encounter for palliative care: Secondary | ICD-10-CM | POA: Diagnosis not present

## 2020-12-24 DIAGNOSIS — R21 Rash and other nonspecific skin eruption: Secondary | ICD-10-CM

## 2020-12-24 NOTE — Telephone Encounter (Signed)
Oral Chemotherapy Pharmacist Encounter   Called [name] to let her know about the plan to switch her from ibrutinib (d/t rash) to acalabrutinib which has a lower incidence of rash. She knows we will begin working on Biochemist, clinical. The goal would be to get her started following her appt on Monday 12/31/20.   Darl Pikes, PharmD, BCPS, BCOP, CPP Hematology/Oncology Clinical Pharmacist [facility]/AP [facility] [phone]  12/24/2020 4:52 PM

## 2020-12-24 NOTE — Progress Notes (Signed)
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  After starting ibrutinib, patient developed a hive-like rash on her bilateral arms, chest, and legs.  Ibrutinib was held on 7/22 and patient was started on prednisone taper and antihistamine.  She [facility] for follow-up.  Today, patient reports complete resolution of her rash.  She denies any other symptomatic concerns today.  She denies any neurologic complaints. Denies recent fevers or illnesses. Denies any easy bleeding or bruising. Reports good appetite and denies weight loss. Denies chest pain. Denies any nausea, vomiting, constipation, or diarrhea. Denies urinary complaints. Patient offers no further specific complaints today.  PAST MEDICAL HISTORY: Past Medical History:  Diagnosis Date   Arthritis    Collagen vascular disease ([place])    GERD (gastroesophageal reflux disease)    Headache(784.0)    History of methotrexate therapy    Hyperlipidemia    hx   Lymphadenopathy of head and neck 01/2015   see on Thyroid ultrasound   Lymphoma, mantle cell ([place]) 06/01/2015   bx of lymph node in right breast/Stage IV Mantle Cell Lymphoma   Personal history of chemotherapy    Rheumatoid arthritis ([place])     PAST SURGICAL HISTORY:  Past Surgical History:  Procedure Laterality Date    BREAST BIOPSY Left 11/07/2020   u/s bx-hydromark #3 "coil"-path pending   CARDIAC CATHETERIZATION  09/2004   [place]; EF 60%   CARDIAC CATHETERIZATION  08/2004   [facility]   IR FLUORO GUIDED NEEDLE PLC ASPIRATION/INJECTION LOC  06/18/2018   PERIPHERAL VASCULAR CATHETERIZATION N/A 07/04/2015   Procedure: [name] Insertion;  Surgeon: [name], MD;  Location: [place] [facility];  Service: Cardiovascular;  Laterality: N/A;   PORTA CATH REMOVAL N/A 06/23/2018   Procedure: PORTA CATH REMOVAL;  Surgeon: [name], MD;  Location: [place] [facility];  Service: Cardiovascular;  Laterality: N/A;   RIGHT/LEFT HEART CATH AND CORONARY ANGIOGRAPHY Bilateral 09/20/2019   Procedure: RIGHT/LEFT HEART CATH AND CORONARY ANGIOGRAPHY;  Surgeon: Nelva Bush, MD;  Location: [place] [facility];  Service: Cardiovascular;  Laterality: Bilateral;    HEMATOLOGY/ONCOLOGY HISTORY:  Oncology History Overview Note  # JAN 2017- MANTLE CELL LYMPHOMA STAGE IV; [R Breast LN [place] Core Bx-1.2cm LN/R Ax LN-Bx]; cyclin D Pos; Mitotic rate-LOW; MIPI score [5/intermediate risk]; BMBx-Positive for involvement. Feb 9th- START Benda-Ritux with neulasta; Prolonged neutropenia; DISCONT- Benda-Ritux;   # April 13 th 2017- START R-CHOP x1; severe/prolonged neutropenia; PET- CR; BMBx-Neg; Disc R-CHOP  # 26th MAY 2017- Start Rituxan q 66M Main OCT 12th 2017- PET NED.  Stop Rituxan maintenance x 2 years [aug 2019-pneumonia]  # June 2022- DIAGNOSIS: thickened cortex of 12 mm. An additional lymph node demonstrates a thickened cortex of 7 mm. A third lymph node demonstrates a mildly thickened cortex of 4 mm A. LYMPH NODE, LEFT AXILLA; ULTRASOUND-GUIDED BIOPSY:  - CD5+ MONOCLONAL B-CELL POPULATION; COMPATIBLE WITH INVOLVEMENT BY THE  PATIENT'S KNOWN MANTLE CELL LYMPHOMA.   # Rheumatoid Arthritis [on MXT]; March 2017-MUGA scan-51 % --------------------------------------------------------    DIAGNOSIS: [jan 2017 ] Mantle cell  lymphoma  STAGE: 4        ;GOALS: Control  CURRENT/MOST RECENT THERAPY: Surveillaince     Mantle cell lymphoma of lymph nodes of head, face, and neck (HCC)    ALLERGIES:  has No Known Allergies.  MEDICATIONS:  Current Outpatient Medications  Medication Sig Dispense Refill   acetaminophen (TYLENOL) 500 MG tablet Take 1,000 mg by mouth every 6 (six) hours as needed for mild pain.      albuterol (VENTOLIN HFA) 108 (90 Base) MCG/ACT inhaler TAKE 2 PUFFS BY MOUTH EVERY 6 HOURS AS NEEDED FOR WHEEZE OR SHORTNESS OF BREATH 18 each 1   carvedilol (COREG) 3.125 MG tablet TAKE 1 TABLET BY MOUTH 2 TIMES DAILY. 180 tablet 0   cyanocobalamin 2000 MCG tablet Take 2,500 mcg by mouth daily.      fluticasone (FLONASE) 50 MCG/ACT nasal spray Place 2 sprays into both nostrils daily as needed for allergies. 16 g 4   furosemide (LASIX) 40 MG tablet TAKE 1 TABLET BY MOUTH EVERY DAY 90 tablet 0   ibrutinib 420 MG TABS Take 1 tablet (420 mg) by mouth daily. 28 tablet 3   losartan (COZAAR) 25 MG tablet TAKE 1 TABLET BY MOUTH EVERY DAY 90 tablet 1   MEGARED OMEGA-3 KRILL OIL PO Take 1 tablet by mouth daily.     meloxicam (MOBIC) 7.5 MG tablet Take 1 tablet (7.5 mg total) by mouth daily as needed for pain. 30 tablet 1   Multiple Vitamins-Minerals (CENTRUM SILVER 50+WOMEN) TABS Take by mouth.     Multiple Vitamins-Minerals (HAIR SKIN AND NAILS FORMULA PO) Take by mouth.     omeprazole (PRILOSEC) 40 MG capsule TAKE 1 CAPSULE BY MOUTH EVERY DAY 90 capsule 1   ondansetron (ZOFRAN) 8 MG tablet One pill every 8 hours as needed for nausea/vomitting. 40 tablet 1   predniSONE (DELTASONE) 20 MG tablet Take 3 tablets (60 mg total) by mouth daily with breakfast for 7 days. 21 tablet 0   rosuvastatin (CRESTOR) 5 MG tablet Take 1 tablet (5 mg total) by mouth daily. 90 tablet 3   spironolactone (ALDACTONE) 25 MG tablet Take 0.5 tablets (12.5 mg total) by mouth daily. 45 tablet 3   No current facility-administered medications  for this visit.   Facility-Administered Medications Ordered in Other Visits  Medication Dose Route Frequency Provider Last Rate Last Admin   heparin lock flush 100 unit/mL  500 Units Intravenous Once Charlaine Dalton R, MD       sodium chloride flush (NS) 0.9 % injection 10 mL  10 mL Intravenous Once Charlaine Dalton R, MD       sodium chloride flush (NS) 0.9 % injection 10 mL  10 mL Intravenous Once Cammie Sickle, MD       [facility]) injection 480 mcg  480 mcg Subcutaneous Once Cammie Sickle, MD        VITAL SIGNS: BP 113/66   Pulse 60   Temp 97.7 F (36.5 C)   Resp 18   Ht '5\' 7"'$  (1.702 m)   Wt 249 lb (112.9 kg)   BMI 39.00 kg/m  Filed Weights   12/24/20 1333  Weight: 249 lb (112.9 kg)    Estimated body mass index is 39 kg/m as calculated from the following:   Height as of this encounter: '5\' 7"'$  (1.702 m).  Weight as of this encounter: 249 lb (112.9 kg).  LABS: CBC:    Component Value Date/Time   WBC 6.6 11/21/2020 0855   HGB 13.2 11/21/2020 0855   HGB 13.4 09/08/2019 1359   HCT 40.6 11/21/2020 0855   HCT 40.9 09/08/2019 1359   PLT 175 11/21/2020 0855   PLT 194 09/08/2019 1359   MCV 91.6 11/21/2020 0855   MCV 90 09/08/2019 1359   MCV 89 09/14/2013 1127   NEUTROABS 4.7 11/21/2020 0855   NEUTROABS 3.8 09/08/2019 1359   LYMPHSABS 1.3 11/21/2020 0855   LYMPHSABS 1.3 09/08/2019 1359   MONOABS 0.4 11/21/2020 0855   EOSABS 0.2 11/21/2020 0855   EOSABS 0.1 09/08/2019 1359   BASOSABS 0.0 11/21/2020 0855   BASOSABS 0.0 09/08/2019 1359   Comprehensive Metabolic Panel:    Component Value Date/Time   NA 140 11/21/2020 0855   NA 145 (H) 09/08/2019 1359   NA 142 09/14/2013 1127   K 4.6 11/21/2020 0855   K 3.5 09/14/2013 1127   CL 103 11/21/2020 0855   CL 110 (H) 09/14/2013 1127   CO2 29 11/21/2020 0855   CO2 29 09/14/2013 1127   BUN 16 11/21/2020 0855   BUN 15 09/08/2019 1359   BUN 8 09/14/2013 1127   CREATININE 0.78 11/21/2020  0855   CREATININE 0.71 09/14/2013 1127   CREATININE 0.68 04/01/2013 1550   GLUCOSE 104 (H) 11/21/2020 0855   GLUCOSE 106 (H) 09/14/2013 1127   CALCIUM 9.1 11/21/2020 0855   CALCIUM 8.6 09/14/2013 1127   AST 22 11/21/2020 0855   AST 22 09/14/2013 1127   ALT 14 11/21/2020 0855   ALT 19 09/14/2013 1127   ALKPHOS 72 11/21/2020 0855   ALKPHOS 76 09/14/2013 1127   BILITOT 0.9 11/21/2020 0855   BILITOT 0.4 09/14/2013 1127   PROT 6.6 11/21/2020 0855   PROT 7.6 09/14/2013 1127   ALBUMIN 3.7 11/21/2020 0855   ALBUMIN 3.2 (L) 09/14/2013 1127    RADIOGRAPHIC STUDIES: NM PET Image Restag (PS) Skull Base To Thigh  Result Date: 12/08/2020 CLINICAL DATA:  Subsequent treatment strategy for mantle cell lymphoma. Recent left axillary nodal biopsy positive for mantle cell lymphoma. EXAM: NUCLEAR MEDICINE PET SKULL BASE TO THIGH TECHNIQUE: 12.8 mCi F-18 FDG was injected intravenously. Full-ring PET imaging was performed from the skull base to thigh after the radiotracer. CT data was obtained and used for attenuation correction and anatomic localization. Fasting blood glucose: 89 mg/dl COMPARISON:  CTs of the chest, abdomen and pelvis 04/17/2020. PET-CT 10/09/2016. FINDINGS: Mediastinal blood pool activity: SUV max 2.5 Liver activity: SUV max 4.6 NECK: Images through the head and neck are mildly motion degraded. No hypermetabolic cervical lymph nodes are identified. There is mildly prominent activity within the lymphoid tissue of Waldeyer's ring, within physiologic limits.No suspicious focal activity of the pharyngeal mucosal space. Incidental CT findings: none CHEST: There are multiple hypermetabolic left axillary and subpectoral lymph nodes. A 3.5 x 1.7 cm node laterally on image 73/3 has an SUV max of 13.3. 1.2 cm node on image 74/3 has an SUV max of 13.0. In addition, there is a hypermetabolic subcutaneous mass posterior to the left acromioclavicular joint, measuring 4.2 x 1.7 cm on image 40/3 (SUV max 16.5).  No hypermetabolic mediastinal, hilar or right axillary lymph nodes. No hypermetabolic pulmonary activity or suspicious pulmonary nodularity. Incidental CT findings: Mild scarring at both lung bases. Mild aortic atherosclerosis. ABDOMEN/PELVIS: There is no hypermetabolic activity within the liver, adrenal glands, spleen or pancreas. There is  no hypermetabolic nodal activity. Incidental CT findings: Fibroid uterus. Stable cystic lesion in the left adnexa, without hypermetabolic activity. SKELETON: There is no hypermetabolic activity to suggest osseous metastatic disease. As above, hypermetabolic subcutaneous mass posterior to the left acromioclavicular joint. No other subcutaneous lesions identified. Incidental CT findings: none IMPRESSION: 1. Several hypermetabolic left axillary and subpectoral lymph nodes consistent with recurrent lymphoma. 2. In addition, there is a hypermetabolic subcutaneous mass posterior to the left acromioclavicular joint, also consistent with lymphoma. 3. No other suspicious hypermetabolic lesions are identified. Electronically Signed   By: Richardean Sale M.D.   On: 12/08/2020 11:37   ECHOCARDIOGRAM COMPLETE  Result Date: 12/06/2020    ECHOCARDIOGRAM REPORT   Patient Name:   OBELIA SATCHWELL Date of Exam: 12/06/2020 Medical Rec #:  [id number]         Height:       67.0 in Accession #:    [id number]        Weight:       253.0 lb Date of Birth:  [birth year]         BSA:          2.235 m Patient Age:    80 years          BP:           122/75 mmHg Patient Gender: F                 HR:           66 bpm. Exam Location:  [facility] Procedure: 2D Echo, Color Doppler, Cardiac Doppler and Strain Analysis Indications:     Z09 Chemo  History:         Patient has prior history of Echocardiogram examinations, most                  recent 12/26/2019. Risk Factors:Dyslipidemia. Mantle cell                  lymphoma of lymph nodes of head, face, and neck. Encounter for                  monitoring cardiotoxic drug  therapy.  Sonographer:     Charmayne Sheer RDCS (AE) Referring Phys:  [id number] Cammie Sickle Diagnosing Phys: Ida Rogue MD  Sonographer Comments: Suboptimal subcostal window. Global longitudinal strain was attempted. IMPRESSIONS  1. Left ventricular ejection fraction, by estimation, is 40 to 45%. The left ventricle has mildly decreased function. The left ventricle demonstrates global hypokinesis. Left ventricular diastolic parameters are consistent with Grade I diastolic dysfunction (impaired relaxation). The average left ventricular global longitudinal strain is -18.6 %.  2. Right ventricular systolic function is normal. The right ventricular size is normal. There is mildly elevated pulmonary artery systolic pressure. The estimated right ventricular systolic pressure is [id number] mmHg. FINDINGS  Left Ventricle: Left ventricular ejection fraction, by estimation, is 40 to 45%. The left ventricle has mildly decreased function. The left ventricle demonstrates global hypokinesis. The average left ventricular global longitudinal strain is -18.6 %. The left ventricular internal cavity size was normal in size. There is no left ventricular hypertrophy. Left ventricular diastolic parameters are consistent with Grade I diastolic dysfunction (impaired relaxation). Right Ventricle: The right ventricular size is normal. No increase in right ventricular wall thickness. Right ventricular systolic function is normal. There is mildly elevated pulmonary artery systolic pressure. The tricuspid regurgitant velocity is 2.87  m/s, and with an assumed right atrial pressure of 5 mmHg, the  estimated right ventricular systolic pressure is [id number] mmHg. Left Atrium: Left atrial size was normal in size. Right Atrium: Right atrial size was normal in size. Pericardium: There is no evidence of pericardial effusion. Mitral Valve: The mitral valve is normal in structure. No evidence of mitral valve regurgitation. No evidence of mitral valve stenosis.  MV peak gradient, 3.2 mmHg. The mean mitral valve gradient is 1.0 mmHg. Tricuspid Valve: The tricuspid valve is normal in structure. Tricuspid valve regurgitation is mild . No evidence of tricuspid stenosis. Aortic Valve: The aortic valve was not well visualized. Aortic valve regurgitation is not visualized. No aortic stenosis is present. Aortic valve mean gradient measures 7.0 mmHg. Aortic valve peak gradient measures 13.5 mmHg. Aortic valve area, by VTI measures 1.80 cm. Pulmonic Valve: The pulmonic valve was normal in structure. Pulmonic valve regurgitation is not visualized. No evidence of pulmonic stenosis. Aorta: The aortic root is normal in size and structure. Venous: The inferior vena cava is normal in size with greater than 50% respiratory variability, suggesting right atrial pressure of 3 mmHg. IAS/Shunts: No atrial level shunt detected by color flow Doppler.  LEFT VENTRICLE PLAX 2D LVIDd:         5.10 cm  Diastology LVIDs:         3.40 cm  LV e' medial:    4.24 cm/s LV PW:         1.00 cm  LV E/e' medial:  6.3 LV IVS:        0.90 cm  LV e' lateral:   10.60 cm/s LVOT diam:     2.00 cm  LV E/e' lateral: 2.5 LV SV:         59 LV SV Index:   26       2D Longitudinal Strain LVOT Area:     3.14 cm 2D Strain GLS Avg:     -18.6 %  RIGHT VENTRICLE RV Basal diam:  3.10 cm LEFT ATRIUM             Index       RIGHT ATRIUM           Index LA diam:        3.90 cm 1.75 cm/m  RA Area:     12.60 cm LA Vol (A2C):   26.5 ml 11.86 ml/m RA Volume:   27.70 ml  12.40 ml/m LA Vol (A4C):   35.3 ml 15.80 ml/m LA Biplane Vol: 31.2 ml 13.96 ml/m  AORTIC VALVE                    PULMONIC VALVE AV Area (Vmax):    1.62 cm     PV Vmax:       1.24 m/s AV Area (Vmean):   1.76 cm     PV Vmean:      80.200 cm/s AV Area (VTI):     1.80 cm     PV VTI:        0.220 m AV Vmax:           184.00 cm/s  PV Peak grad:  6.2 mmHg AV Vmean:          125.000 cm/s PV Mean grad:  3.0 mmHg AV VTI:            0.326 m AV Peak Grad:      13.5 mmHg  AV Mean Grad:      7.0 mmHg LVOT Vmax:  94.60 cm/s LVOT Vmean:        70.000 cm/s LVOT VTI:          0.187 m LVOT/AV VTI ratio: 0.57  AORTA Ao Root diam: 2.90 cm MITRAL VALVE               TRICUSPID VALVE MV Area (PHT): 2.72 cm    TR Peak grad:   32.9 mmHg MV Area VTI:   2.73 cm    TR Vmax:        287.00 cm/s MV Peak grad:  3.2 mmHg MV Mean grad:  1.0 mmHg    SHUNTS MV Vmax:       0.89 m/s    Systemic VTI:  0.19 m MV Vmean:      41.5 cm/s   Systemic Diam: 2.00 cm MV Decel Time: 279 msec MV E velocity: 26.60 cm/s MV A velocity: 63.40 cm/s MV E/A ratio:  0.42 Ida Rogue MD Electronically signed by Ida Rogue MD Signature Date/Time: 12/06/2020/4:13:48 PM    Final    Pulmonary Function Test [facility] Only  Result Date: 12/12/2020 Spirometry Data Is Acceptable and Reproducible No obvious evidence of Obstructive Airways disease or Restrictive Lung disease RECOMMEND WEIGHT LOSS Consider outpatient follow up with Pulmonary if needed. Clinical Correlation Advised   PERFORMANCE STATUS (ECOG) : 1 - Symptomatic but completely ambulatory  Review of Systems Unless otherwise noted, a complete review of systems is negative.  Physical Exam General: NAD Cardiovascular: regular rate and rhythm Pulmonary: clear ant fields Abdomen: soft, nontender, + bowel sounds GU: no suprapubic tenderness Extremities: no edema, no joint deformities Skin: no rashes Neurological: Grossly nonfocal  Assessment and Plan- Patient is a 80 y.o. female stage IV mantle cell lymphoma on ibrutinib who presents to [facility] for follow-up of a drug rash   Rash -patient has complete resolution of her rash.  She continues to take the prednisone taper and was instructed to complete the course.  We will continue holding the ibrutinib as this most likely was a drug rash.  We will have her follow-up next week with Dr. Rogue Bussing to discuss alternatives treatment options.  Case and plan discussed with Dr. Rogue Bussing and Nuala Alpha,  PharmD   Patient expressed understanding and was in agreement with this plan. She also understands that She can call clinic at any time with any questions, concerns, or complaints.   Thank you for allowing me to participate in the care of this very pleasant patient.   Time Total: 15 minutes  Visit consisted of counseling and education dealing with the complex and emotionally intense issues of symptom management and palliative care in the setting of serious and potentially life-threatening illness.Greater than 50%  of this time was spent counseling and coordinating care related to the above assessment and plan.  Signed by: Altha Harm, PhD, NP-C

## 2020-12-25 ENCOUNTER — Other Ambulatory Visit (HOSPITAL_COMMUNITY): Payer: Self-pay

## 2020-12-25 ENCOUNTER — Telehealth: Payer: Self-pay | Admitting: Pharmacy Technician

## 2020-12-25 ENCOUNTER — Encounter: Payer: Self-pay | Admitting: Internal Medicine

## 2020-12-25 ENCOUNTER — Telehealth: Payer: Self-pay | Admitting: Pharmacist

## 2020-12-25 DIAGNOSIS — C8311 Mantle cell lymphoma, lymph nodes of head, face, and neck: Secondary | ICD-10-CM

## 2020-12-25 MED ORDER — CALQUENCE 100 MG PO CAPS
100.0000 mg | ORAL_CAPSULE | Freq: Two times a day (BID) | ORAL | 2 refills | Status: DC
  Filled 2020-12-25 – 2020-12-26 (×2): qty 60, 30d supply, fill #0
  Filled 2021-01-14: qty 60, 30d supply, fill #1
  Filled 2021-02-19: qty 60, 30d supply, fill #2

## 2020-12-25 NOTE — Telephone Encounter (Signed)
Oral Oncology Pharmacist Encounter  Received new prescription for Calquence (acalabrutinib) for the treatment of recurrent stage IV mantle cell lymphoma, planned duration until disease progression or unacceptable drug toxicity. Patient was on ibrutinib but developed a full body rash, the plan is for her to transition to treatment with acalabrutinib which has a lower incidence of rash.   Prescription dose and frequency assessed.   Current medication list in Epic reviewed, a few DDIs with acalabrutinib identified: Omeprazole: PPIs like omeprazole may decrease the serum concentration of acalabrutinib. The recommendation is to avoid concurrent use of acalabrutinib with any PPI. Omeprazole should be discontinued. If acid supression is needed, the below options are available:  H2 antagonist, such as famotidine, can be used if separated from acalabrutinib. Take acalabrutinib 10 hours after the H2 antagonist and at least 2 hours prior to the next H2 antagonist dose Antacids (calcium carbonate) can be used but should be separated from acalabrutinib by at least 2 hours.  Meloxicam: acalabrutinib may enhance the antiplatelet effect of meloxicam. Monitor patients for signs of bleeding, no baseline dose adjustment needed.  Evaluated chart and no patient barriers to medication adherence identified.   Prescription has been e-scribed to the [facility] for benefits analysis and approval.  Oral Oncology Clinic will continue to follow for insurance authorization, copayment issues, initial counseling and start date.   Darl Pikes, PharmD, BCPS, BCOP, CPP Hematology/Oncology Clinical Pharmacist Practitioner [facility]/AP [place] [facility] [phone]  12/25/2020 9:46 AM

## 2020-12-25 NOTE — Telephone Encounter (Signed)
Oral Oncology Patient Advocate Encounter  Prior Authorization for Calquence has been approved.    PA# [id number] Effective dates: 12/25/20 through 06/01/21  Patients co-pay is $3161.23.  Will apply for patient assistance with [place].  Oral Oncology Clinic will continue to follow.   [place] Patient [place] Phone [phone] Fax [phone] 12/25/2020 9:31 AM

## 2020-12-25 NOTE — Telephone Encounter (Signed)
Oral Oncology Patient Advocate Encounter   Received notification from [facility] that prior authorization for Calquence is required.   PA submitted on CoverMyMeds Key [id number] Status is pending   Oral Oncology Clinic will continue to follow.  [name] Patient [place] Phone [phone] Fax [phone] 12/25/2020 9:30 AM

## 2020-12-26 ENCOUNTER — Other Ambulatory Visit: Payer: Self-pay | Admitting: Internal Medicine

## 2020-12-26 ENCOUNTER — Encounter: Payer: Self-pay | Admitting: Internal Medicine

## 2020-12-26 ENCOUNTER — Telehealth: Payer: Self-pay | Admitting: Pharmacy Technician

## 2020-12-26 ENCOUNTER — Other Ambulatory Visit (HOSPITAL_COMMUNITY): Payer: Self-pay

## 2020-12-26 NOTE — Telephone Encounter (Signed)
Oral Oncology Patient Advocate Encounter  Was successful in securing patient a $8,000 grant from [facility] to provide copayment coverage for Calquence.  This will keep the out of pocket expense at $0.     [employer] ID: [id number]  I have spoken with the patient.   The billing information is as follows and has been shared with [place].    RxBin: [id number] PCN: [id number] Member ID: [id number] Group ID: [id number] Dates of Eligibility: 11/26/20 through 11/25/21  Fund:  [name] Patient [place] Phone [phone] Fax [phone] 12/26/2020 10:00 AM

## 2020-12-26 NOTE — Telephone Encounter (Signed)
Oral Oncology Patient Advocate Encounter  I spoke with Michelle [name] this morning to set up delivery of Calquence.  Address verified for shipment.  Calquence will be filled through [facility] and mailed 12/26/20 for delivery 12/27/20.  Informed patient bring medication with her to her next appointment for education and to not start taking it until instructed.    [place] will call 7-10 days before next refill is due to complete adherence call and set up delivery of medication.     [place] Patient [place] Phone [phone] Fax [phone] 12/26/2020 11:28 AM

## 2020-12-28 ENCOUNTER — Other Ambulatory Visit: Payer: Self-pay

## 2020-12-28 ENCOUNTER — Encounter: Payer: Self-pay | Admitting: Obstetrics and Gynecology

## 2020-12-28 ENCOUNTER — Other Ambulatory Visit (HOSPITAL_COMMUNITY)
Admission: RE | Admit: 2020-12-28 | Discharge: 2020-12-28 | Disposition: A | Discharge status: Home / Self Care | Payer: Medicare HMO | Source: Ambulatory Visit | Attending: Obstetrics and Gynecology | Admitting: Obstetrics and Gynecology

## 2020-12-28 ENCOUNTER — Ambulatory Visit: Payer: Medicare HMO | Admitting: Obstetrics and Gynecology

## 2020-12-28 VITALS — BP 119/73 | HR 90 | Ht 67.0 in | Wt 251.7 lb

## 2020-12-28 DIAGNOSIS — R9389 Abnormal findings on diagnostic imaging of other specified body structures: Secondary | ICD-10-CM | POA: Diagnosis not present

## 2020-12-28 DIAGNOSIS — N9489 Other specified conditions associated with female genital organs and menstrual cycle: Secondary | ICD-10-CM | POA: Diagnosis not present

## 2020-12-28 NOTE — Progress Notes (Signed)
HPI:      [name] is a 80 y.o. G0P0000 who LMP was No LMP recorded. Patient is postmenopausal.  Subjective:   She presents today as a referral from [employer].  Patient recently had a pelvic ultrasound which showed a small uterine fibroid, a mildly thickened endometrium and a complex adnexal mass.  Michelle [name] says that she has been asymptomatic from all 3 of these things.  She reports no vaginal bleeding and no pelvic pain. Patient currently being treated for lymphoma.    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 She does not have any pertinent problems on file. She  has a past surgical history that includes Cardiac catheterization (09/2004); Cardiac catheterization (08/2004); Cardiac catheterization (N/A, 07/04/2015); IR Fluoro Guide Ndl Plmt / BX (06/18/2018); PORTA CATH REMOVAL (N/A, 06/23/2018); RIGHT/LEFT HEART CATH AND CORONARY ANGIOGRAPHY (Bilateral, 09/20/2019); and Breast biopsy (Left, 11/07/2020). Her family history includes Arthritis in her brother; Cholelithiasis in her mother; Diabetes in her mother and sister; Heart murmur in her sister; Hypertension in her sister. She  reports that she has never smoked. She has never used smokeless tobacco. She reports that she does not drink alcohol and does not use drugs. She has a current medication list which includes the following prescription(s): calquence, acetaminophen, albuterol, carvedilol, cyanocobalamin, fluticasone, furosemide, losartan, krill oil, meloxicam, centrum silver 50+women, multiple vitamins-minerals, omeprazole, ondansetron,  rosuvastatin, spironolactone, and prednisone, and the following Facility-Administered Medications: heparin lock flush, sodium chloride flush, sodium chloride flush, and tbo-filgrastim. She has No Known Allergies.       Review of Systems:  Review of Systems  Constitutional: Denied constitutional symptoms, night sweats, recent illness, fatigue, fever, insomnia and weight loss.  Eyes: Denied eye symptoms, eye pain, photophobia, vision change and visual disturbance.  Ears/Nose/Throat/Neck: Denied ear, nose, throat or neck symptoms, hearing loss, nasal discharge, sinus congestion and sore throat.  Cardiovascular: Denied cardiovascular symptoms, arrhythmia, chest pain/pressure, edema, exercise intolerance, orthopnea and palpitations.  Respiratory: Denied pulmonary symptoms, asthma, pleuritic pain, productive sputum, cough, dyspnea and wheezing.  Gastrointestinal: Denied, gastro-esophageal reflux, melena, nausea and vomiting.  Genitourinary: Denied genitourinary symptoms including symptomatic vaginal discharge, pelvic relaxation issues, and urinary complaints.  Musculoskeletal: Denied musculoskeletal symptoms, stiffness, swelling, muscle weakness and myalgia.  Dermatologic: Denied dermatology symptoms, rash and scar.  Neurologic: Denied neurology symptoms, dizziness, headache, neck pain and syncope.  Psychiatric: Denied psychiatric symptoms, anxiety and depression.  Endocrine: Denied endocrine symptoms including hot flashes and night sweats.   Meds:   Current Outpatient Medications on File Prior to Visit  Medication Sig Dispense Refill   acalabrutinib (CALQUENCE) 100 MG capsule Take 1 capsule (100 mg total) by mouth 2 (two) times daily. 60 capsule 2   acetaminophen (TYLENOL) 500 MG tablet Take 1,000 mg by mouth every 6 (six) hours as needed for mild pain.      albuterol (VENTOLIN HFA) 108 (90 Base) MCG/ACT inhaler TAKE 2 PUFFS BY MOUTH EVERY 6 HOURS AS NEEDED FOR WHEEZE OR SHORTNESS OF BREATH 18  each 1   carvedilol (COREG) 3.125 MG tablet TAKE 1 TABLET BY MOUTH 2 TIMES DAILY. 180 tablet 0   cyanocobalamin 2000  MCG tablet Take 2,500 mcg by mouth daily.      fluticasone (FLONASE) 50 MCG/ACT nasal spray Place 2 sprays into both nostrils daily as needed for allergies. 16 g 4   furosemide (LASIX) 40 MG tablet TAKE 1 TABLET BY MOUTH EVERY DAY 90 tablet 0   losartan (COZAAR) 25 MG tablet TAKE 1 TABLET BY MOUTH EVERY DAY 90 tablet 1   MEGARED OMEGA-3 KRILL OIL PO Take 1 tablet by mouth daily.     meloxicam (MOBIC) 7.5 MG tablet Take 1 tablet (7.5 mg total) by mouth daily as needed for pain. 30 tablet 1   Multiple Vitamins-Minerals (CENTRUM SILVER 50+WOMEN) TABS Take by mouth.     Multiple Vitamins-Minerals (HAIR SKIN AND NAILS FORMULA PO) Take by mouth.     omeprazole (PRILOSEC) 40 MG capsule TAKE 1 CAPSULE BY MOUTH EVERY DAY 90 capsule 1   ondansetron (ZOFRAN) 8 MG tablet One pill every 8 hours as needed for nausea/vomitting. 40 tablet 1   rosuvastatin (CRESTOR) 5 MG tablet Take 1 tablet (5 mg total) by mouth daily. 90 tablet 3   spironolactone (ALDACTONE) 25 MG tablet Take 0.5 tablets (12.5 mg total) by mouth daily. 45 tablet 3   predniSONE (DELTASONE) 20 MG tablet Take 3 tablets (60 mg total) by mouth daily with breakfast for 7 days. (Patient not taking: Reported on 12/28/2020) 21 tablet 0   Current Facility-Administered Medications on File Prior to Visit  Medication Dose Route Frequency Provider Last Rate Last Admin   heparin lock flush 100 unit/mL  500 Units Intravenous Once Charlaine Dalton R, MD       sodium chloride flush (NS) 0.9 % injection 10 mL  10 mL Intravenous Once Charlaine Dalton R, MD       sodium chloride flush (NS) 0.9 % injection 10 mL  10 mL Intravenous Once Cammie Sickle, MD       Tbo-Filgrastim [facility]) injection 480 mcg  480 mcg Subcutaneous Once Cammie Sickle, MD          Objective:     Vitals:   12/28/20 1006  BP: 119/73  Pulse: 90    Filed Weights   12/28/20 1006  Weight: 251 lb 11.2 oz (114.2 kg)              Physical examination   Pelvic:   Vulva: Normal appearance.  No lesions.  Vagina: No lesions or abnormalities noted.  Support: Normal pelvic support.  Urethra No masses tenderness or scarring.  Meatus Normal size without lesions or prolapse.  Cervix: Normal appearance.  No lesions.  Anus: Normal exam.  No lesions.  Perineum: Normal exam.  No lesions.        Bimanual   Uterus: Normal size.  Non-tender.  Mobile.  AV.  Adnexae: No masses.  Non-tender to palpation.  Cul-de-sac: Negative for abnormality.   Endometrial Biopsy After discussion with the patient regarding her abnormal uterine bleeding I recommended that she proceed with an endometrial biopsy for further diagnosis. The risks, benefits, alternatives, and indications for an endometrial biopsy were discussed with the patient in detail. She understood the risks including infection, bleeding, cervical laceration and uterine perforation.  Verbal consent was obtained.   PROCEDURE NOTE:  Vacurette endometrial biopsy was performed using aseptic technique with iodine preparation.  The uterus was sounded to a length of 8 cm.  Adequate sampling was obtained with minimal blood loss.  Scant tissue obtained despite knowing I am sampling of the endometrial cavity.  The  patient tolerated the procedure well.  Disposition will be pending pathology.   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 Adnexal mass   2. Thickened endometrium     Mildly thickened endometrium-await endometrial biopsy-suspected normal endometrium  Most likely nonovarian adnexal mass.  Based on ultrasound appearance doubt cancer.  Patient asymptomatic   Plan:            1.  Await endometrial biopsy results  2.  ROMA testing.  After a lengthy discussion patient believes that if her Roma comes back normal she would prefer to not do anything further regarding work-up for adnexal mass. (Not interested in MRI at this time)  Orders Orders Placed This Encounter  Procedures   Ovarian Malignancy Risk-ROMA    No orders of the defined types were placed in this encounter.     F/U  Return in about 2 weeks (around 01/11/2021). I spent 34 minutes involved in the care of this patient preparing to see the patient by obtaining and reviewing her medical history (including labs, imaging tests and prior procedures), documenting clinical information in the electronic health record (EHR), counseling and coordinating care plans, writing and sending prescriptions, ordering tests or procedures and directly communicating with the patient by discussing pertinent items from her history and physical exam as well as detailing my assessment and plan as noted above so that she has an informed understanding.  All of her questions were answered.  Finis Bud, M.D. 12/28/2020 11:18 AM

## 2020-12-28 NOTE — Addendum Note (Signed)
Addended by: Durwin Glaze on: 12/28/2020 11:32 AM   Modules accepted: Orders

## 2020-12-30 LAB — POSTMENOPAUSAL INTERP: HIGH

## 2020-12-30 LAB — OVARIAN MALIGNANCY RISK-ROMA
Cancer Antigen (CA) 125: 20.6 U/mL (ref 0.0–38.1)
HE4: 133 pmol/L — ABNORMAL HIGH (ref 0.0–96.9)
Postmenopausal ROMA: 3.12 — ABNORMAL HIGH
Premenopausal ROMA: 4.57 — ABNORMAL HIGH

## 2020-12-30 LAB — PREMENOPAUSAL INTERP: HIGH

## 2020-12-31 ENCOUNTER — Inpatient Hospital Stay: Payer: Medicare HMO | Admitting: Internal Medicine

## 2020-12-31 ENCOUNTER — Inpatient Hospital Stay: Payer: Medicare HMO

## 2020-12-31 ENCOUNTER — Inpatient Hospital Stay: Payer: Medicare HMO | Attending: Internal Medicine

## 2020-12-31 ENCOUNTER — Ambulatory Visit: Payer: Medicare HMO | Admitting: Internal Medicine

## 2020-12-31 ENCOUNTER — Other Ambulatory Visit: Payer: Medicare HMO

## 2020-12-31 ENCOUNTER — Inpatient Hospital Stay: Payer: Medicare HMO | Admitting: Pharmacist

## 2020-12-31 ENCOUNTER — Other Ambulatory Visit: Payer: Self-pay

## 2020-12-31 DIAGNOSIS — J984 Other disorders of lung: Secondary | ICD-10-CM | POA: Insufficient documentation

## 2020-12-31 DIAGNOSIS — D709 Neutropenia, unspecified: Secondary | ICD-10-CM | POA: Diagnosis not present

## 2020-12-31 DIAGNOSIS — I272 Pulmonary hypertension, unspecified: Secondary | ICD-10-CM | POA: Insufficient documentation

## 2020-12-31 DIAGNOSIS — Z79899 Other long term (current) drug therapy: Secondary | ICD-10-CM | POA: Insufficient documentation

## 2020-12-31 DIAGNOSIS — C8318 Mantle cell lymphoma, lymph nodes of multiple sites: Secondary | ICD-10-CM | POA: Diagnosis not present

## 2020-12-31 DIAGNOSIS — Z9221 Personal history of antineoplastic chemotherapy: Secondary | ICD-10-CM | POA: Insufficient documentation

## 2020-12-31 DIAGNOSIS — I428 Other cardiomyopathies: Secondary | ICD-10-CM | POA: Diagnosis not present

## 2020-12-31 DIAGNOSIS — M2550 Pain in unspecified joint: Secondary | ICD-10-CM | POA: Diagnosis not present

## 2020-12-31 DIAGNOSIS — Z298 Encounter for other specified prophylactic measures: Secondary | ICD-10-CM | POA: Diagnosis not present

## 2020-12-31 DIAGNOSIS — C8311 Mantle cell lymphoma, lymph nodes of head, face, and neck: Secondary | ICD-10-CM

## 2020-12-31 DIAGNOSIS — Z833 Family history of diabetes mellitus: Secondary | ICD-10-CM | POA: Diagnosis not present

## 2020-12-31 DIAGNOSIS — Z8379 Family history of other diseases of the digestive system: Secondary | ICD-10-CM | POA: Insufficient documentation

## 2020-12-31 DIAGNOSIS — M25512 Pain in left shoulder: Secondary | ICD-10-CM | POA: Insufficient documentation

## 2020-12-31 DIAGNOSIS — M25511 Pain in right shoulder: Secondary | ICD-10-CM | POA: Insufficient documentation

## 2020-12-31 DIAGNOSIS — M069 Rheumatoid arthritis, unspecified: Secondary | ICD-10-CM | POA: Diagnosis not present

## 2020-12-31 DIAGNOSIS — E785 Hyperlipidemia, unspecified: Secondary | ICD-10-CM | POA: Insufficient documentation

## 2020-12-31 DIAGNOSIS — Z8249 Family history of ischemic heart disease and other diseases of the circulatory system: Secondary | ICD-10-CM | POA: Diagnosis not present

## 2020-12-31 DIAGNOSIS — M549 Dorsalgia, unspecified: Secondary | ICD-10-CM | POA: Insufficient documentation

## 2020-12-31 DIAGNOSIS — R519 Headache, unspecified: Secondary | ICD-10-CM | POA: Diagnosis not present

## 2020-12-31 DIAGNOSIS — Z8261 Family history of arthritis: Secondary | ICD-10-CM | POA: Diagnosis not present

## 2020-12-31 LAB — BASIC METABOLIC PANEL
Anion gap: 11 (ref 5–15)
BUN: 20 mg/dL (ref 8–23)
CO2: 29 mmol/L (ref 22–32)
Calcium: 9.1 mg/dL (ref 8.9–10.3)
Chloride: 96 mmol/L — ABNORMAL LOW (ref 98–111)
Creatinine, Ser: 1 mg/dL (ref 0.44–1.00)
GFR, Estimated: 58 mL/min — ABNORMAL LOW (ref >60)
Glucose, Bld: 109 mg/dL — ABNORMAL HIGH (ref 70–99)
Potassium: 4.3 mmol/L (ref 3.5–5.1)
Sodium: 136 mmol/L (ref 135–145)

## 2020-12-31 LAB — CBC WITH DIFFERENTIAL/PLATELET
Abs Immature Granulocytes: 0.08 10*3/uL — ABNORMAL HIGH (ref 0.00–0.07)
Basophils Absolute: 0 10*3/uL (ref 0.0–0.1)
Basophils Relative: 0 %
Eosinophils Absolute: 0.2 10*3/uL (ref 0.0–0.5)
Eosinophils Relative: 2 %
HCT: 45.3 % (ref 36.0–46.0)
Hemoglobin: 14.8 g/dL (ref 12.0–15.0)
Immature Granulocytes: 1 %
Lymphocytes Relative: 15 %
Lymphs Abs: 1.6 10*3/uL (ref 0.7–4.0)
MCH: 29.8 pg (ref 26.0–34.0)
MCHC: 32.7 g/dL (ref 30.0–36.0)
MCV: 91.1 fL (ref 80.0–100.0)
Monocytes Absolute: 0.6 10*3/uL (ref 0.1–1.0)
Monocytes Relative: 5 %
Neutro Abs: 8.4 10*3/uL — ABNORMAL HIGH (ref 1.7–7.7)
Neutrophils Relative %: 77 %
Platelets: 213 10*3/uL (ref 150–400)
RBC: 4.97 MIL/uL (ref 3.87–5.11)
RDW: 14.2 % (ref 11.5–15.5)
WBC: 10.9 10*3/uL — ABNORMAL HIGH (ref 4.0–10.5)
nRBC: 0 % (ref 0.0–0.2)

## 2020-12-31 LAB — LACTATE DEHYDROGENASE: LDH: 174 U/L (ref 98–192)

## 2020-12-31 LAB — SURGICAL PATHOLOGY

## 2020-12-31 MED ORDER — CILGAVIMAB (PART OF EVUSHELD) INJECTION
300.0000 mg | Freq: Once | INTRAMUSCULAR | Status: AC
  Administered 2020-12-31: 300 mg via INTRAMUSCULAR
  Filled 2020-12-31: qty 3

## 2020-12-31 MED ORDER — TIXAGEVIMAB (PART OF EVUSHELD) INJECTION
300.0000 mg | Freq: Once | INTRAMUSCULAR | Status: AC
  Administered 2020-12-31: 300 mg via INTRAMUSCULAR
  Filled 2020-12-31: qty 3

## 2020-12-31 NOTE — Assessment & Plan Note (Addendum)
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  # complete clinical response noted-  Poor tolerance to IBRUTINIB- severe rash [see below].  Discontinue ibrutinib.  # plan to start Calequence 100 mg twice a day.  Again reviewed the potential side effects including but not limited to joint pains/easy bruising/elevated blood pressure [although less compared to ibrutinib]; LFT elevation and headaches.  Discussed with Allison-/pharmacy oral chemotherapy education.  # Chronic arthritis-NSAIDs as needed.  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  Monitor closely on acalbrutinib.  # Pulmonary hypertension/chronic bilateral lung scarring- [s/p pulmonary evaluation]-improved on albuterol- Stable  # # COVID EVUSHELD PROPHYLAXIS: s/p  EVUSHELD.   # DISPOSITION: # follow up-MD in 2 weeks; MD ;labs- cbc/bmp/ldh;.Dr.B

## 2020-12-31 NOTE — Progress Notes (Addendum)
[place]  [phone] [phone]  Patient Care Team: Burnard Hawthorne, FNP as PCP - General (Family Medicine) End, Harrell Gave, MD as PCP - Cardiology (Cardiology) Deboraha Sprang, MD as PCP - Electrophysiology (Cardiology) Jackolyn Confer, MD (Internal Medicine) Cammie Sickle, MD as Consulting Physician (Internal Medicine)   Name of the patient: Michelle [name]  LG:[id number]  08-06-1940   Date of visit: 12/31/20  HPI: Patient is a 80 y.o. female with recurrent stage IV mantle cell lymphoma. Patient was on ibrutinib but developed a full body rash, the plan is for her to transition to treatment with Calquence (acalabrutinib) which has a lower incidence of rash.   Reason for Consult: Calquence (acalabrutinib) oral chemotherapy education.   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 Feb 9th- START Benda-Ritux with neulasta; Prolonged neutropenia; DISCONT- Benda-Ritux;   # April 13 th 2017- START R-CHOP x1; severe/prolonged neutropenia; PET- CR; BMBx-Neg; Disc R-CHOP  # 26th MAY 2017- Start Rituxan q 37M Main OCT 12th 2017- PET NED.  Stop Rituxan maintenance x 2 years [aug 2019-pneumonia]  # June 2022- DIAGNOSIS: thickened  cortex of 12 mm. An additional lymph node demonstrates a thickened cortex of 7 mm. A third lymph node demonstrates a mildly thickened cortex of 4 mm A. LYMPH NODE, LEFT AXILLA; ULTRASOUND-GUIDED BIOPSY:  - CD5+ MONOCLONAL B-CELL POPULATION; COMPATIBLE WITH INVOLVEMENT BY THE  PATIENT'S KNOWN MANTLE CELL LYMPHOMA.   #July 2022-second week-started ibrutinib 420 mg a dayx 1 week-stop because of severe rash.  Significant clinical response noted.  # AUG 1st, 2022- start acalbrutinib.   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  MEDICATIONS:  Current Outpatient Medications  Medication Sig Dispense Refill   acalabrutinib (CALQUENCE) 100 MG capsule Take 1 capsule (100 mg total) by mouth 2 (two) times daily. (Patient not taking: Reported on 12/31/2020) 60 capsule 2   acetaminophen (TYLENOL) 500 MG tablet Take 1,000 mg by mouth every 6 (six) hours as needed for mild pain.      albuterol (VENTOLIN HFA) 108 (90 Base) MCG/ACT inhaler TAKE 2 PUFFS BY MOUTH EVERY 6 HOURS AS NEEDED FOR WHEEZE OR SHORTNESS OF BREATH 18 each 1   carvedilol (COREG) 3.125 MG tablet TAKE 1 TABLET BY MOUTH 2 TIMES DAILY. 180 tablet 0   cyanocobalamin 2000 MCG tablet Take 2,500 mcg by mouth daily.      fluticasone (FLONASE) 50 MCG/ACT nasal spray Place  2 sprays into both nostrils daily as needed for allergies. 16 g 4   furosemide (LASIX) 40 MG tablet TAKE 1 TABLET BY MOUTH EVERY DAY 90 tablet 0   losartan (COZAAR) 25 MG tablet TAKE 1 TABLET BY MOUTH EVERY DAY 90 tablet 1   MEGARED OMEGA-3 KRILL OIL PO Take 1 tablet by mouth daily.     meloxicam (MOBIC) 7.5 MG tablet Take 1 tablet (7.5 mg total) by mouth daily as  needed for pain. 30 tablet 1   Multiple Vitamins-Minerals (CENTRUM SILVER 50+WOMEN) TABS Take by mouth.     Multiple Vitamins-Minerals (HAIR SKIN AND NAILS FORMULA PO) Take by mouth.     omeprazole (PRILOSEC) 40 MG capsule TAKE 1 CAPSULE BY MOUTH EVERY DAY 90 capsule 1   ondansetron (ZOFRAN) 8 MG tablet One pill every 8 hours as needed for nausea/vomitting. 40 tablet 1   rosuvastatin (CRESTOR) 5 MG tablet Take 1 tablet (5 mg total) by mouth daily. 90 tablet 3   spironolactone (ALDACTONE) 25 MG tablet Take 0.5 tablets (12.5 mg total) by mouth daily. 45 tablet 3   No current facility-administered medications for this visit.   Facility-Administered Medications Ordered in Other Visits  Medication Dose Route Frequency Provider Last Rate Last Admin   heparin lock flush 100 unit/mL  500 Units Intravenous Once Charlaine Dalton R, MD       sodium chloride flush (NS) 0.9 % injection 10 mL  10 mL Intravenous Once Charlaine Dalton R, MD       sodium chloride flush (NS) 0.9 % injection 10 mL  10 mL Intravenous Once Cammie Sickle, MD       Tbo-Filgrastim [facility]) injection 480 mcg  480 mcg Subcutaneous Once Cammie Sickle, MD        VITAL SIGNS: There were no vitals taken for this visit. There were no vitals filed for this visit.  Estimated body mass index is 39.47 kg/m as calculated from the following:   Height as of an earlier encounter on 12/31/20: '5\' 7"'$  (1.702 m).   Weight as of an earlier encounter on 12/31/20: 114.3 kg (252 lb).  LABS: CBC:    Component Value Date/Time   WBC 10.9 (H) 12/31/2020 0912   HGB 14.8 12/31/2020 0912   HGB 13.4 09/08/2019 1359   HCT 45.3 12/31/2020 0912   HCT 40.9 09/08/2019 1359   PLT 213 12/31/2020 0912   PLT 194 09/08/2019 1359   MCV 91.1 12/31/2020 0912   MCV 90 09/08/2019 1359   MCV 89 09/14/2013 1127   NEUTROABS 8.4 (H) 12/31/2020 0912   NEUTROABS 3.8 09/08/2019 1359   LYMPHSABS 1.6 12/31/2020 0912   LYMPHSABS 1.3 09/08/2019 1359    MONOABS 0.6 12/31/2020 0912   EOSABS 0.2 12/31/2020 0912   EOSABS 0.1 09/08/2019 1359   BASOSABS 0.0 12/31/2020 0912   BASOSABS 0.0 09/08/2019 1359   Comprehensive Metabolic Panel:    Component Value Date/Time   NA 136 12/31/2020 0912   NA 145 (H) 09/08/2019 1359   NA 142 09/14/2013 1127   K 4.3 12/31/2020 0912   K 3.5 09/14/2013 1127   CL 96 (L) 12/31/2020 0912   CL 110 (H) 09/14/2013 1127   CO2 29 12/31/2020 0912   CO2 29 09/14/2013 1127   BUN 20 12/31/2020 0912   BUN 15 09/08/2019 1359   BUN 8 09/14/2013 1127   CREATININE 1.00 12/31/2020 0912   CREATININE 0.71 09/14/2013 1127   CREATININE 0.68 04/01/2013 1550   GLUCOSE 109 (H)  12/31/2020 0912   GLUCOSE 106 (H) 09/14/2013 1127   CALCIUM 9.1 12/31/2020 0912   CALCIUM 8.6 09/14/2013 1127   AST 22 11/21/2020 0855   AST 22 09/14/2013 1127   ALT 14 11/21/2020 0855   ALT 19 09/14/2013 1127   ALKPHOS 72 11/21/2020 0855   ALKPHOS 76 09/14/2013 1127   BILITOT 0.9 11/21/2020 0855   BILITOT 0.4 09/14/2013 1127   PROT 6.6 11/21/2020 0855   PROT 7.6 09/14/2013 1127   ALBUMIN 3.7 11/21/2020 0855   ALBUMIN 3.2 (L) 09/14/2013 1127     Present during today's visit: patient and her family member   Assessment and Plan: Start plan: She will get started this morning, 12/31/20   Patient Education I spoke with patient for overview of new oral chemotherapy medication: Calquence (acalabrutinib) for the treatment of recurrent stage IV mantle cell lymphoma, planned duration until disease progression or unacceptable drug toxicity.  Administration: Counseled patient on administration, dosing, side effects, monitoring, drug-food interactions, safe handling, storage, and disposal. Patient will take 1 capsule (100 mg total) by mouth 2 (two) times daily.  Side Effects: Side effects include but not limited to: headache, diarrhea, decreased wbc/hgb/plt.   Headache: discussed drinking something with caffeine in it if she has a headache on  acalabrutinib. Acalabrutinib headaches respond well to caffeine. Diarrhea: Ms. Bricco will pick up some loperamide to have on hand if needed. Instructed her to call the office if she is taking the loperamide and continues to have 4 or more loose stools a day.  Drug-drug interactions (DDI): Discussed omeprazole DDI with Ms. Ayllon. She reports only using omeprazole as needed for reflex currently. She will stop her omeprazole while on her acalabrutinib, omeprazole was removed from her medication list. She will use calcium carbonate if she needs medication for her reflux. She was instructed to separate the calcium carbonate and acalabrutinib by at least 2 hours.   Adherence: After discussion with patient no patient barriers to medication adherence identified. Provided patient with AM/PM medication tray.  Reviewed with patient importance of keeping a medication schedule and plan for any missed doses.  Ms. Austell voiced understanding and appreciation. All questions answered. Medication handout provided.  Provided patient with [facility] phone number. Patient knows to call the office with questions or concerns. [facility] will continue to follow.  Patient expressed understanding and was in agreement with this plan. She also understands that She can call clinic at any time with any questions, concerns, or complaints.   Medication Access Issues: No issues, patient has a grant to cover the copay for her Calquence.  Follow-up plan: f/u in pharmacy clinic in 2 weeks  Thank you for allowing me to participate in the care of this patient.   Time Total: 20 mins  Visit consisted of counseling and education on dealing with issues of symptom management in the setting of serious and potentially life-threatening illness.Greater than 50%  of this time was spent counseling and coordinating care related to the above assessment and plan.  Signed by: Darl Pikes, PharmD, BCPS, Salley Slaughter, CPP Hematology/Oncology Clinical Pharmacist Practitioner [facility]/HP/AP [facility] [phone]  12/31/2020 2:15 PM

## 2020-12-31 NOTE — Patient Instructions (Signed)
[facility] ONCOL[facility]  Discharge Instructions: Thank you for choosing [facility] to provide your oncology and hematology care.  If you have a lab appointment with the [facility], please go directly to the [facility] and check in at the registration area.  Wear comfortable clothing and clothing appropriate for easy access to any Portacath or PICC line.   We strive to give you quality time with your provider. You may need to reschedule your appointment if you arrive late (15 or more minutes).  Arriving late affects you and other patients whose appointments are after yours.  Also, if you miss three or more appointments without notifying the office, you may be dismissed from the clinic at the provider's discretion.      For prescription refill requests, have your pharmacy contact our office and allow 72 hours for refills to be completed.    Today you received the following chemotherapy and/or immunotherapy agents : evushield    To help prevent nausea and vomiting after your treatment, we encourage you to take your nausea medication as directed.  BELOW ARE SYMPTOMS THAT SHOULD BE REPORTED IMMEDIATELY: *FEVER GREATER THAN 100.4 F (38 C) OR HIGHER *CHILLS OR SWEATING *NAUSEA AND VOMITING THAT IS NOT CONTROLLED WITH YOUR NAUSEA MEDICATION *UNUSUAL SHORTNESS OF BREATH *UNUSUAL BRUISING OR BLEEDING *URINARY PROBLEMS (pain or burning when urinating, or frequent urination) *BOWEL PROBLEMS (unusual diarrhea, constipation, pain near the anus) TENDERNESS IN MOUTH AND THROAT WITH OR WITHOUT PRESENCE OF ULCERS (sore throat, sores in mouth, or a toothache) UNUSUAL RASH, SWELLING OR PAIN  UNUSUAL VAGINAL DISCHARGE OR ITCHING   Items with * indicate a potential emergency and should be followed up as soon as possible or go to the Emergency Department if any problems should occur.  Please show the CHEMOTHERAPY ALERT CARD or IMMUNOTHERAPY ALERT CARD at check-in  to the Emergency Department and triage nurse.  Should you have questions after your visit or need to cancel or reschedule your appointment, please contact [facility]  [phone] and follow the prompts.  Office hours are 8:00 a.m. to 4:30 p.m. Monday - Friday. Please note that voicemails left after 4:00 p.m. may not be returned until the following business day.  We are closed weekends and major holidays. You have access to a nurse at all times for urgent questions. Please call the main number to the clinic [phone] and follow the prompts.  For any non-urgent questions, you may also contact your provider using MyChart. We now offer e-Visits for anyone 32 and older to request care online for non-urgent symptoms. For details visit [email or web address].   Also download the MyChart app! Go to the app store, search "MyChart", open the app, select [facility], and log in with your MyChart username and password.  Due to Covid, a mask is required upon entering the hospital/clinic. If you do not have a mask, one will be given to you upon arrival. For doctor visits, patients may have 1 support person aged 41 or older with them. For treatment visits, patients cannot have anyone with them due to current Covid guidelines and our immunocompromised population.

## 2020-12-31 NOTE — Progress Notes (Signed)
Pt completed evushield with 1 hour observation without incidence.

## 2020-12-31 NOTE — Progress Notes (Signed)
[facility] OFF[facility] PROGRESS NOTE  Patient Care Team: Burnard Hawthorne, FNP as PCP - General (Family Medicine) End, Harrell Gave, MD as PCP - Cardiology (Cardiology) Deboraha Sprang, MD as PCP - Electrophysiology (Cardiology) Jackolyn Confer, MD (Internal Medicine) Cammie Sickle, MD as Consulting Physician (Internal Medicine)  Cancer Staging No matching staging information was found for the patient.   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 Feb 9th- START Benda-Ritux with neulasta; Prolonged neutropenia; DISCONT- Benda-Ritux;   # April 13 th 2017- START R-CHOP x1; severe/prolonged neutropenia; PET- CR; BMBx-Neg; Disc R-CHOP  # 26th MAY 2017- Start Rituxan q 30M Main OCT 12th 2017- PET NED.  Stop Rituxan maintenance x 2 years [aug 2019-pneumonia]  # June 2022- DIAGNOSIS: thickened cortex of 12 mm. An additional lymph node demonstrates a thickened cortex of 7 mm. A third lymph node demonstrates a mildly thickened cortex of 4 mm A. LYMPH NODE, LEFT AXILLA; ULTRASOUND-GUIDED BIOPSY:  - CD5+ MONOCLONAL B-CELL POPULATION; COMPATIBLE WITH INVOLVEMENT BY THE  PATIENT'S KNOWN MANTLE CELL LYMPHOMA.   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  female pleasant patient above history of recurrent mantle cell lymphoma is here for follow-up.  Patient started ibrutinib approximately 2 weeks ago approximatel few days later patient broke out in a diffuse rash.  Ibrutinib  stopped; started on steroids/significant improvement rash resolved.  In the interim patient was also evaluated by gynecology-on s/p Endometrial biopsy.  Awaiting results.  Patient denies any nausea vomiting abdominal pain.  No chest pain.  No swelling in the legs.   Review of Systems  Constitutional:  Negative for chills, diaphoresis, fever, malaise/fatigue and weight loss.  HENT:  Negative for nosebleeds and sore throat.   Eyes:  Negative for double vision.  Respiratory:  Negative for hemoptysis, sputum production and wheezing.   Cardiovascular:  Negative for chest pain, palpitations, orthopnea and leg swelling.  Gastrointestinal:  Negative for abdominal pain, blood in stool, constipation, diarrhea, heartburn, melena, nausea and vomiting.  Genitourinary:  Negative for dysuria, frequency and urgency.  Musculoskeletal:  Positive for back pain and joint pain.  Skin: Negative.  Negative for itching and rash.  Neurological:  Negative for dizziness, tingling, focal weakness, weakness and headaches.  Endo/Heme/Allergies:  Does not bruise/bleed easily.  Psychiatric/Behavioral:  Negative for depression. The patient is not nervous/anxious and does not have insomnia.    PAST MEDICAL HISTORY :  Past Medical History:  Diagnosis Date   Arthritis    Collagen vascular disease ([place])    GERD (gastroesophageal reflux disease)    Headache(784.0)    History of methotrexate therapy    Hyperlipidemia    hx   Lymphadenopathy of head and neck 01/2015   see on Thyroid ultrasound   Lymphoma, mantle cell ([place]) 06/01/2015   bx of lymph node in right breast/Stage IV Mantle Cell Lymphoma   Personal history of chemotherapy    Rheumatoid arthritis ([place])     PAST SURGICAL HISTORY :   Past Surgical  History:  Procedure Laterality Date   BREAST BIOPSY Left 11/07/2020   u/s bx-hydromark #3 "coil"-path pending   CARDIAC CATHETERIZATION  09/2004   [facility]; EF 60%   CARDIAC CATHETERIZATION  08/2004   [facility]   IR  FLUORO GUIDED NEEDLE PLC ASPIRATION/INJECTION LOC  06/18/2018   PERIPHERAL VASCULAR CATHETERIZATION N/A 07/04/2015   Procedure: [name] Insertion;  Surgeon: [name], MD;  Location: [facility];  Service: Cardiovascular;  Laterality: N/A;   PORTA CATH REMOVAL N/A 06/23/2018   Procedure: PORTA CATH REMOVAL;  Surgeon: [name], MD;  Location: [facility];  Service: Cardiovascular;  Laterality: N/A;   RIGHT/LEFT HEART CATH AND CORONARY ANGIOGRAPHY Bilateral 09/20/2019   Procedure: RIGHT/LEFT HEART CATH AND CORONARY ANGIOGRAPHY;  Surgeon: Nelva Bush, MD;  Location: [facility];  Service: Cardiovascular;  Laterality: Bilateral;    FAMILY HISTORY :   Family History  Problem Relation Age of Onset   Diabetes Mother    Cholelithiasis Mother    Hypertension Sister    Diabetes Sister    Heart murmur Sister    Arthritis Brother    Breast cancer Neg Hx     SOCIAL HISTORY:   Social History   Tobacco Use   Smoking status: Never   Smokeless tobacco: Never  Vaping Use   Vaping Use: Never used  Substance Use Topics   Alcohol use: No   Drug use: No    ALLERGIES:  has No Known Allergies.  MEDICATIONS:  Current Outpatient Medications  Medication Sig Dispense Refill   acetaminophen (TYLENOL) 500 MG tablet Take 1,000 mg by mouth every 6 (six) hours as needed for mild pain.      albuterol (VENTOLIN HFA) 108 (90 Base) MCG/ACT inhaler TAKE 2 PUFFS BY MOUTH EVERY 6 HOURS AS NEEDED FOR WHEEZE OR SHORTNESS OF BREATH 18 each 1   carvedilol (COREG) 3.125 MG tablet TAKE 1 TABLET BY MOUTH 2 TIMES DAILY. 180 tablet 0   cyanocobalamin 2000 MCG tablet Take 2,500 mcg by mouth daily.      fluticasone (FLONASE) 50 MCG/ACT nasal spray Place 2 sprays into both nostrils daily as needed for allergies. 16 g 4   furosemide (LASIX) 40 MG tablet TAKE 1 TABLET BY MOUTH EVERY DAY 90 tablet 0   losartan (COZAAR) 25 MG tablet TAKE 1 TABLET BY MOUTH EVERY DAY 90 tablet 1   MEGARED  OMEGA-3 KRILL OIL PO Take 1 tablet by mouth daily.     meloxicam (MOBIC) 7.5 MG tablet Take 1 tablet (7.5 mg total) by mouth daily as needed for pain. 30 tablet 1   Multiple Vitamins-Minerals (CENTRUM SILVER 50+WOMEN) TABS Take by mouth.     Multiple Vitamins-Minerals (HAIR SKIN AND NAILS FORMULA PO) Take by mouth.     omeprazole (PRILOSEC) 40 MG capsule TAKE 1 CAPSULE BY MOUTH EVERY DAY 90 capsule 1   ondansetron (ZOFRAN) 8 MG tablet One pill every 8 hours as needed for nausea/vomitting. 40 tablet 1   rosuvastatin (CRESTOR) 5 MG tablet Take 1 tablet (5 mg total) by mouth daily. 90 tablet 3   spironolactone (ALDACTONE) 25 MG tablet Take 0.5 tablets (12.5 mg total) by mouth daily. 45 tablet 3   acalabrutinib (CALQUENCE) 100 MG capsule Take 1 capsule (100 mg total) by mouth 2 (two) times daily. (Patient not taking: Reported on 12/31/2020) 60 capsule 2   No current facility-administered medications for this visit.   Facility-Administered Medications Ordered in Other Visits  Medication Dose Route  Frequency Provider Last Rate Last Admin   heparin lock flush 100 unit/mL  500 Units Intravenous Once Charlaine Dalton R, MD       sodium chloride flush (NS) 0.9 % injection 10 mL  10 mL Intravenous Once Charlaine Dalton R, MD       sodium chloride flush (NS) 0.9 % injection 10 mL  10 mL Intravenous Once Cammie Sickle, MD       Tbo-Filgrastim [facility]) injection 480 mcg  480 mcg Subcutaneous Once Cammie Sickle, MD        PHYSICAL EXAMINATION: ECOG PERFORMANCE STATUS: 0 - Asymptomatic  BP 107/81   Pulse 65   Temp 98.1 F (36.7 C) (Tympanic)   Resp 20   Ht '5\' 7"'$  (1.702 m)   Wt 252 lb (114.3 kg)   BMI 39.47 kg/m   Filed Weights   12/31/20 0926  Weight: 252 lb (114.3 kg)    Physical Exam Constitutional:      Comments: Obese.  Walking herself with cane.  Accompanied by her sister.  HENT:     Head: Normocephalic and atraumatic.     Mouth/Throat:     Pharynx: No  oropharyngeal exudate.  Eyes:     Pupils: Pupils are equal, round, and reactive to light.  Cardiovascular:     Rate and Rhythm: Normal rate and regular rhythm.  Pulmonary:     Effort: No respiratory distress.     Breath sounds: No wheezing.  Abdominal:     General: Bowel sounds are normal. There is no distension.     Palpations: Abdomen is soft. There is no mass.     Tenderness: There is no abdominal tenderness. There is no guarding or rebound.  Musculoskeletal:        General: No tenderness. Normal range of motion.     Cervical back: Normal range of motion and neck supple.  Skin:    General: Skin is warm.     Comments: Resolution of the 4 to 5 cm soft mass noted on the left posterior shoulder.  And also resolution of the left axillary lymphadenopathy.  Neurological:     Mental Status: She is alert and oriented to person, place, and time.  Psychiatric:        Mood and Affect: Affect normal.   LABORATORY DATA:  I have reviewed the data as listed    Component Value Date/Time   NA 136 12/31/2020 0912   NA 145 (H) 09/08/2019 1359   NA 142 09/14/2013 1127   K 4.3 12/31/2020 0912   K 3.5 09/14/2013 1127   CL 96 (L) 12/31/2020 0912   CL 110 (H) 09/14/2013 1127   CO2 29 12/31/2020 0912   CO2 29 09/14/2013 1127   GLUCOSE 109 (H) 12/31/2020 0912   GLUCOSE 106 (H) 09/14/2013 1127   BUN 20 12/31/2020 0912   BUN 15 09/08/2019 1359   BUN 8 09/14/2013 1127   CREATININE 1.00 12/31/2020 0912   CREATININE 0.71 09/14/2013 1127   CREATININE 0.68 04/01/2013 1550   CALCIUM 9.1 12/31/2020 0912   CALCIUM 8.6 09/14/2013 1127   PROT 6.6 11/21/2020 0855   PROT 7.6 09/14/2013 1127   ALBUMIN 3.7 11/21/2020 0855   ALBUMIN 3.2 (L) 09/14/2013 1127   AST 22 11/21/2020 0855   AST 22 09/14/2013 1127   ALT 14 11/21/2020 0855   ALT 19 09/14/2013 1127   ALKPHOS 72 11/21/2020 0855   ALKPHOS 76 09/14/2013 1127   BILITOT 0.9 11/21/2020 0855   BILITOT  0.4 09/14/2013 1127   GFRNONAA 58 (L) 12/31/2020  0912   GFRNONAA >60 09/14/2013 1127   GFRAA >60 01/20/2020 1303   GFRAA >60 09/14/2013 1127    No results found for: SPEP, UPEP  Lab Results  Component Value Date   WBC 10.9 (H) 12/31/2020   NEUTROABS 8.4 (H) 12/31/2020   HGB 14.8 12/31/2020   HCT 45.3 12/31/2020   MCV 91.1 12/31/2020   PLT 213 12/31/2020      Chemistry      Component Value Date/Time   NA 136 12/31/2020 0912   NA 145 (H) 09/08/2019 1359   NA 142 09/14/2013 1127   K 4.3 12/31/2020 0912   K 3.5 09/14/2013 1127   CL 96 (L) 12/31/2020 0912   CL 110 (H) 09/14/2013 1127   CO2 29 12/31/2020 0912   CO2 29 09/14/2013 1127   BUN 20 12/31/2020 0912   BUN 15 09/08/2019 1359   BUN 8 09/14/2013 1127   CREATININE 1.00 12/31/2020 0912   CREATININE 0.71 09/14/2013 1127   CREATININE 0.68 04/01/2013 1550      Component Value Date/Time   CALCIUM 9.1 12/31/2020 0912   CALCIUM 8.6 09/14/2013 1127   ALKPHOS 72 11/21/2020 0855   ALKPHOS 76 09/14/2013 1127   AST 22 11/21/2020 0855   AST 22 09/14/2013 1127   ALT 14 11/21/2020 0855   ALT 19 09/14/2013 1127   BILITOT 0.9 11/21/2020 0855   BILITOT 0.4 09/14/2013 1127       RADIOGRAPHIC STUDIES: I have personally reviewed the radiological images as listed and agreed with the findings in the report. No results found.   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  # complete clinical response noted-  Poor tolerance to IBRUTINIB- severe rash [see below].  Discontinue ibrutinib.  # plan to start Calequence 100 mg twice a day.  Again reviewed the potential side effects including but not limited to joint pains/easy bruising/elevated blood pressure [although  less compared to ibrutinib]; LFT elevation and headaches.  Discussed with Allison-/pharmacy oral chemotherapy education.  # Chronic arthritis-NSAIDs as needed.  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  Monitor closely on acalbrutinib.  # Pulmonary hypertension/chronic bilateral lung scarring- [s/p pulmonary evaluation]-improved on albuterol- Stable  # # COVID EVUSHELD PROPHYLAXIS: s/p  EVUSHELD.   # DISPOSITION: # follow up-MD in 2 weeks; MD ;labs- cbc/bmp/ldh;.Dr.B            No orders of the defined types were placed in this encounter.  All questions were answered. The patient knows to call the clinic with any problems, questions or concerns.      Cammie Sickle, MD 12/31/2020 10:12 AM

## 2021-01-02 ENCOUNTER — Other Ambulatory Visit (HOSPITAL_COMMUNITY): Payer: Self-pay

## 2021-01-02 ENCOUNTER — Encounter: Payer: Self-pay | Admitting: Internal Medicine

## 2021-01-02 DIAGNOSIS — Z20822 Contact with and (suspected) exposure to covid-19: Secondary | ICD-10-CM | POA: Diagnosis not present

## 2021-01-02 DIAGNOSIS — Z03818 Encounter for observation for suspected exposure to other biological agents ruled out: Secondary | ICD-10-CM | POA: Diagnosis not present

## 2021-01-02 NOTE — Addendum Note (Signed)
Addended by: Darl Pikes on: 01/02/2021 02:07 PM   Modules accepted: Orders

## 2021-01-03 NOTE — Progress Notes (Signed)
Cardiology Office Note    Date:  01/10/2021   ID:  Michelle, [name] Dec 18, 1940, MRN [id number]  PCP:  Burnard Hawthorne, FNP  Cardiologist:  Nelva Bush, MD  Electrophysiologist:  Virl Axe, MD   Chief Complaint: Follow-up  History of Present Illness:   Michelle [name] is a 80 y.o. female with history of HFrEF secondary to NICM, pulmonary hypertension, rheumatoid arthritis, mantle cell lymphoma, HLD, LBBB, obesity, and GERD who presents for follow-up of her cardiomyopathy.   Remote LHC from 08/2004 showed no significant CAD with an LVEF of 60%.  Prior echo from 12/2018 showed an EF of 50 to 55%, mild LVH, grade 1 diastolic dysfunction, normal RV systolic function and ventricular cavity size, and mild biatrial enlargement.  More recently, echo in 08/2019 showed a new cardiomyopathy with an LVEF of 30 to 35%, global hypokinesis with severe hypokinesis of the anterior and anteroseptal walls, normal RV systolic function and ventricular cavity size, mild pulmonary hypertension, mild mitral regurgitation, moderate tricuspid regurgitation.  Diagnostic R/LHC in 09/2019 showed no angiographically significant CAD with normal left heart filling pressures.  Upper normal to mildly elevated right heart and pulmonary artery pressures.  Normal cardiac output/index.  She has been managed with GDMT with symptom improvement.  She was seen on 11/21/2019 feeling slightly better than she had at her prior visits.  She still had some fatigue and exertional dyspnea with modest activity though this was improving.  She was taking furosemide a few days per week as it was difficult for her to use the restroom while working.  She was euvolemic with a weight of 243 pounds.  She remained NYHA class III.  She was started on bisoprolol 2.5 mg nightly and continued on losartan with recommendation to attempt to take Lasix on a daily basis.  We subsequently received a phone call indicating she was not tolerating bisoprolol  and in this setting, it was discontinued.  She underwent follow-up limited echo on maximally tolerated GDMT on 12/26/2019, which showed an EF of 35 to 40%, severe HK of the anteroseptal and anterior wall, normal RV systolic function and RV cavity size, and no significant valvular abnormality.  She was seen in the office on 12/30/2019 and was doing well from a cardiac perspective.  She continued to feel about the same as she did with her last visit with continued fatigue and exertional dyspnea with mild to modest activity.  She was taking Lasix 40 mg approximately 3 days/week, was adding salt to some of her foods, and drinking over 2 L of liquid per day.  Her weight was stable at 242 pounds.  Since discontinuing bisoprolol she felt like her energy had improved.  Her vital signs precluded escalation of GDMT at that time.  She was referred to EP for consideration of CRT with underlying LBBB with persistent cardiomyopathy on maximally tolerated GDMT.  She was seen in 03/2020 and was euvolemic with escalation of GDMT being precluded by fatigue, bradycardia, and relative hypotension leading to the tapering of losartan. She continued to take Lasix 3 days/week rather than daily.  She was evaluated by EP on 03/29/2020 with recommendation to further escalate GDMT initially.   She was seen in 04/2020, and was doing well from a cardiac perspective, noting some improvement in her dyspnea.  She continued to take Lasix approximately 3 to 4 days/week.  She was mildly volume overloaded and advised to take Lasix on a daily basis.  She was started on low-dose carvedilol  3.125 mg twice daily.  She followed up with EP on 05/10/2020 with a weight that was up 6 pounds when compared to her visit in late 04/2020.  She continued to note she was feeling better and was not interested in pursuing CRT.  She was last seen in the office in 06/2020 and was doing very well from a cardiac perspective.  Her underlying dyspnea was noted to be improving.   Her weight was down 6 pounds when compared to her last in person visit.  On average, she was taking Lasix 40 mg approximately 2 days/week.  Relative hypotension precluded further escalation of GDMT.  Most recent echo in 11/2020, as ordered for oncology medication monitoring (acalbrutinib) demonstrated improved LV systolic function with an EF of 40 to 45%, global hypokinesis, grade 1 diastolic dysfunction, normal RV systolic function and ventricular cavity size, mild tricuspid regurgitation, and mildly elevated PASP.  She comes in accompanied by her husband today and is doing very well from a cardiac perspective.  As outlined above, most recent echo demonstrated an improvement in her LV systolic function.  She denies any chest pain, dyspnea, palpitations, dizziness, presyncope, or syncope.  Her weight remains stable.  She is not taking Lasix on a daily basis.  She is watching her salt and p.o. fluid consumption.  She is tolerating all medications without issues.  She is very pleased with her improvement.  Both patient and husband note significant improvement in her functional status.   Labs independently reviewed: 12/2020 - Hgb 14.8, PLT 213, potassium 4.3, BUN 20, serum creatinine 1.0 10/2020 - albumin 3.7, AST/ALT normal 09/2020 - TC 151, TG 44, HDL 64, LDL 77 12/2018 - TSH normal  Past Medical History:  Diagnosis Date   Arthritis    Collagen vascular disease ([place])    GERD (gastroesophageal reflux disease)    Headache(784.0)    History of methotrexate therapy    Hyperlipidemia    hx   Lymphadenopathy of head and neck 01/2015   see on Thyroid ultrasound   Lymphoma, mantle cell ([place]) 06/01/2015   bx of lymph node in right breast/Stage IV Mantle Cell Lymphoma   Personal history of chemotherapy    Rheumatoid arthritis [facility])     Past Surgical History:  Procedure Laterality Date   BREAST BIOPSY Left 11/07/2020   u/s bx-hydromark #3 "coil"-path pending   CARDIAC CATHETERIZATION  09/2004   [place];  EF 60%   CARDIAC CATHETERIZATION  08/2004   [facility]   IR FLUORO GUIDED NEEDLE PLC ASPIRATION/INJECTION LOC  06/18/2018   PERIPHERAL VASCULAR CATHETERIZATION N/A 07/04/2015   Procedure: [name] Insertion;  Surgeon: [name], MD;  Location: [place] [facility];  Service: Cardiovascular;  Laterality: N/A;   PORTA CATH REMOVAL N/A 06/23/2018   Procedure: PORTA CATH REMOVAL;  Surgeon: [name], MD;  Location: [place] [facility];  Service: Cardiovascular;  Laterality: N/A;   RIGHT/LEFT HEART CATH AND CORONARY ANGIOGRAPHY Bilateral 09/20/2019   Procedure: RIGHT/LEFT HEART CATH AND CORONARY ANGIOGRAPHY;  Surgeon: Nelva Bush, MD;  Location: [place] [facility];  Service: Cardiovascular;  Laterality: Bilateral;    Current Medications: Current Meds  Medication Sig   acalabrutinib (CALQUENCE) 100 MG capsule Take 1 capsule (100 mg total) by mouth 2 (two) times daily.   acetaminophen (TYLENOL) 500 MG tablet Take 1,000 mg by mouth every 6 (six) hours as needed for mild pain.    albuterol (VENTOLIN HFA) 108 (90 Base) MCG/ACT inhaler TAKE 2 PUFFS BY MOUTH EVERY  6 HOURS AS NEEDED FOR WHEEZE OR SHORTNESS OF BREATH   carvedilol (COREG) 3.125 MG tablet TAKE 1 TABLET BY MOUTH 2 TIMES DAILY.   cyanocobalamin 2000 MCG tablet Take 2,500 mcg by mouth daily.    fluticasone (FLONASE) 50 MCG/ACT nasal spray Place 2 sprays into both nostrils daily as needed for allergies.   furosemide (LASIX) 40 MG tablet TAKE 1 TABLET BY MOUTH EVERY DAY   losartan (COZAAR) 25 MG tablet TAKE 1 TABLET BY MOUTH EVERY DAY   MEGARED OMEGA-3 KRILL OIL PO Take 1 tablet by mouth daily.   meloxicam (MOBIC) 7.5 MG tablet Take 1 tablet (7.5 mg total) by mouth daily as needed for pain.   Multiple Vitamins-Minerals (CENTRUM SILVER 50+WOMEN) TABS Take by mouth.   Multiple Vitamins-Minerals (HAIR SKIN AND NAILS FORMULA PO) Take by mouth.   ondansetron (ZOFRAN) 8 MG tablet One pill every 8 hours as needed for nausea/vomitting.    rosuvastatin (CRESTOR) 5 MG tablet Take 1 tablet (5 mg total) by mouth daily.   spironolactone (ALDACTONE) 25 MG tablet Take 0.5 tablets (12.5 mg total) by mouth daily.    Allergies:   Patient has no known allergies.   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   Social Determinants of Health   Financial Resource Strain: Low Risk    Difficulty of Paying Living Expenses: Not hard at all  Food Insecurity: No Food Insecurity   Worried About Charity fundraiser in the Last Year: Never true   [place] in the Last Year: Never true  Transportation Needs: No Transportation Needs   Lack of Transportation (Medical): No   Lack of Transportation (Non-Medical): No  Physical Activity: Insufficiently Active   Days of Exercise per Week: 3 days   Minutes of Exercise per Session: 20 min  Stress: No Stress Concern Present   Feeling of Stress : Not at all  Social Connections: Unknown   Frequency of Communication with Friends and Family: More than three times a week   Frequency of Social Gatherings with Friends and Family: Not on file   Attends Religious Services: Not on file   Active Member of Clubs or Organizations: Not on file   Attends Archivist Meetings: Not on file   Marital Status: Married     Family History:  The patient's family history includes Arthritis in her brother; Cholelithiasis in her mother; Diabetes in her mother and sister;  Heart murmur in her sister; Hypertension in her sister. There is no history of Breast cancer.  ROS:   Review of Systems  Constitutional:  Negative for chills, diaphoresis, fever, malaise/fatigue and weight loss.  HENT:  Negative for congestion.   Eyes:  Negative for discharge and redness.  Respiratory:  Negative for cough, sputum production, shortness of breath and  wheezing.   Cardiovascular:  Negative for chest pain, palpitations, orthopnea, claudication, leg swelling and PND.  Gastrointestinal:  Negative for abdominal pain, heartburn, nausea and vomiting.  Musculoskeletal:  Negative for falls and myalgias.  Skin:  Negative for rash.  Neurological:  Negative for dizziness, tingling, tremors, sensory change, speech change, focal weakness, loss of consciousness and weakness.  Endo/Heme/Allergies:  Does not bruise/bleed easily.  Psychiatric/Behavioral:  Negative for substance abuse. The patient is not nervous/anxious.   All other systems reviewed and are negative.   EKGs/Labs/Other Studies Reviewed:    Studies reviewed were summarized above. The additional studies were reviewed today:  2D echo 08/2019: 1. Left ventricular ejection fraction, by estimation, is 30 to 35%. The  left ventricle has moderately decreased function. The left ventricle  demonstrates global hypokinesis with severe hypokinesis of teh anterior,  anteroseptal wall.   2. Right ventricular systolic function is normal. The right ventricular  size is normal. There is moderately elevated pulmonary artery systolic  pressure. The estimated right ventricular systolic pressure is [id number] mmHg.   3. Mild mitral valve regurgitation.   4. Tricuspid valve regurgitation is moderate.   5. The inferior vena cava is normal in size with greater than 50%  respiratory variability, suggesting right atrial pressure of 3 mmHg.   Comparison(s): EF 50-55%. __________   [facility] 09/2019: Conclusions: No angiographically significant coronary  artery disease. Normal left heart filling pressures. Upper normal to mildly elevated right heart and pulmonary artery pressures. Normal Fick cardiac output/index.   Recommendations: Optimize goal-directed medical therapy for management of nonischemic cardiomyopathy.  We will increase losartan to 25 mg daily.  BMP should be repeated in approximately 2 weeks to ensure stable renal function and potassium. Primary prevention of coronary artery disease. __________   Limited 2D echo 12/2019: 1. Left ventricular ejection fraction, by estimation, is 35 to 40%. The  left ventricle has moderately decreased function. The left ventricle has  no regional wall motion abnormalities. There is severe hypokinesis of the  left ventricular, anteroseptal  wall and anterior wall.   2. Right ventricular systolic function is normal. The right ventricular  size is normal. There is normal pulmonary artery systolic pressure.   3. The mitral valve is normal in structure. No evidence of mitral valve  regurgitation. No evidence of mitral stenosis.   4. The aortic valve is normal in structure. Aortic valve regurgitation is  not visualized. No aortic stenosis is present. __________  2D echo 11/2020: 1. Left ventricular ejection fraction, by estimation, is 40 to 45%. The  left ventricle has mildly decreased function. The left ventricle  demonstrates global hypokinesis. Left ventricular diastolic parameters are  consistent with Grade I diastolic  dysfunction (impaired relaxation). The average left ventricular global  longitudinal strain is -18.6 %.   2. Right ventricular systolic function is normal. The right ventricular  size is normal. There is mildly elevated pulmonary artery systolic  pressure. The estimated right ventricular systolic pressure is [id number] mmHg.    EKG:  EKG is ordered today.  The EKG ordered today demonstrates NSR, 66 bpm, LBBB (known)  Recent Labs: 11/21/2020: ALT 14 12/31/2020: BUN 20;  Creatinine, Ser 1.00; Hemoglobin 14.8; Platelets 213; Potassium 4.3; Sodium 136  Recent Lipid Panel    Component Value Date/Time   CHOL 151 10/12/2020 1119   TRIG 44.0 10/12/2020 1119   HDL 64.70 10/12/2020 1119   CHOLHDL 2 10/12/2020 1119   VLDL 8.8 10/12/2020 1119   LDLCALC 77 10/12/2020 1119  PHYSICAL EXAM:    VS:  BP (!) 100/56 (BP Location: Left Arm, Patient Position: Sitting, Cuff Size: Normal)   Pulse 66   Ht '5\' 7"'$  (1.702 m)   Wt 247 lb 4 oz (112.2 kg)   SpO2 98%   BMI 38.72 kg/m   BMI: Body mass index is 38.72 kg/m.  Physical Exam Vitals reviewed.  Constitutional:      Appearance: She is well-developed.  HENT:     Head: Normocephalic and atraumatic.  Eyes:     General:        Right eye: No discharge.        Left eye: No discharge.  Neck:     Vascular: No JVD.  Cardiovascular:     Rate and Rhythm: Normal rate and regular rhythm.     Pulses:          Posterior tibial pulses are 2+ on the right side and 2+ on the left side.     Heart sounds: Normal heart sounds, S1 normal and S2 normal. Heart sounds not distant. No midsystolic click and no opening snap. No murmur heard.   No friction rub.  Pulmonary:     Effort: Pulmonary effort is normal. No respiratory distress.     Breath sounds: Normal breath sounds. No decreased breath sounds, wheezing or rales.  Chest:     Chest wall: No tenderness.  Abdominal:     General: There is no distension.     Palpations: Abdomen is soft.     Tenderness: There is no abdominal tenderness.  Musculoskeletal:     Cervical back: Normal range of motion.     Right lower leg: No edema.     Left lower leg: No edema.  Skin:    General: Skin is warm and dry.     Nails: There is no clubbing.  Neurological:     Mental Status: She is alert and oriented to person, place, and time.  Psychiatric:        Speech: Speech normal.        Behavior: Behavior normal.        Thought Content: Thought content normal.        Judgment: Judgment  normal.    Wt Readings from Last 3 Encounters:  01/10/21 247 lb 4 oz (112.2 kg)  12/31/20 252 lb (114.3 kg)  12/28/20 251 lb 11.2 oz (114.2 kg)     ASSESSMENT & PLAN:   HFrEF secondary to NICM/LBBB: She appears euvolemic and well compensated with NYHA class II symptoms.  Most recent echo demonstrated continued improvement in her LV systolic function with an EF now 45%.  Continue current GDMT including carvedilol, losartan, spironolactone, and furosemide.  Relative hypotension precludes escalation of GDMT with transition from ARB to ARNI or addition of SGLT2i.  Recent labs showed normal renal function and potassium.  No symptoms of syncope.  CHF education.  HLD: LDL 71 in 2020.  She remains on pravastatin which is followed by PCP.  Disposition: F/u with Dr. Saunders Revel or an APP in 6 months, and EP as directed.   Medication Adjustments/Labs and Tests Ordered: Current medicines are reviewed at length with the patient today.  Concerns regarding medicines are outlined above. Medication changes, Labs and Tests ordered today are summarized above and listed in the Patient Instructions accessible in Encounters.   Signed, Christell Faith, PA-C 01/10/2021 4:29 PM     [facility] [address] [place] [address] [place], [place] [address] [phone]

## 2021-01-04 ENCOUNTER — Ambulatory Visit: Payer: Medicare HMO | Admitting: Family

## 2021-01-10 ENCOUNTER — Encounter: Payer: Self-pay | Admitting: Physician Assistant

## 2021-01-10 ENCOUNTER — Other Ambulatory Visit: Payer: Self-pay

## 2021-01-10 ENCOUNTER — Ambulatory Visit: Payer: Medicare HMO | Admitting: Physician Assistant

## 2021-01-10 VITALS — BP 100/56 | HR 66 | Ht 67.0 in | Wt 247.2 lb

## 2021-01-10 DIAGNOSIS — I5020 Unspecified systolic (congestive) heart failure: Secondary | ICD-10-CM

## 2021-01-10 DIAGNOSIS — E785 Hyperlipidemia, unspecified: Secondary | ICD-10-CM

## 2021-01-10 DIAGNOSIS — I428 Other cardiomyopathies: Secondary | ICD-10-CM | POA: Diagnosis not present

## 2021-01-10 DIAGNOSIS — I447 Left bundle-branch block, unspecified: Secondary | ICD-10-CM | POA: Diagnosis not present

## 2021-01-10 NOTE — Patient Instructions (Signed)
Medication Instructions:  Your physician recommends that you continue on your current medications as directed. Please refer to the Current Medication list given to you today.  *If you need a refill on your cardiac medications before your next appointment, please call your pharmacy*   Lab Work: None ordered If you have labs (blood work) drawn today and your tests are completely normal, you will receive your results only by: [place] (if you have MyChart) OR A paper copy in the mail If you have any lab test that is abnormal or we need to change your treatment, we will call you to review the results.   Testing/Procedures: None ordered   Follow-Up: At [facility], you and your health needs are our priority.  As part of our continuing mission to provide you with exceptional heart care, we have created designated Provider Care Teams.  These Care Teams include your primary Cardiologist (physician) and Advanced Practice Providers (APPs -  Physician Assistants and Nurse Practitioners) who all work together to provide you with the care you need, when you need it.  We recommend signing up for the patient portal called "MyChart".  Sign up information is provided on this After Visit Summary.  MyChart is used to connect with patients for Virtual Visits (Telemedicine).  Patients are able to view lab/test results, encounter notes, upcoming appointments, etc.  Non-urgent messages can be sent to your provider as well.   To learn more about what you can do with MyChart, go to [email or web address].    Your next appointment:   6 month(s)  The format for your next appointment:   In Person  Provider:   You may see Nelva Bush, MD or one of the following Advanced Practice Providers on your designated Care Team:   Murray Hodgkins, NP Christell Faith, PA-C Marrianne Mood, PA-C Cadence Kathlen Mody, [place]   Other Instructions N/A

## 2021-01-11 ENCOUNTER — Telehealth: Payer: Self-pay

## 2021-01-11 ENCOUNTER — Encounter: Payer: Self-pay | Admitting: Obstetrics and Gynecology

## 2021-01-11 ENCOUNTER — Telehealth: Payer: Self-pay | Admitting: Obstetrics and Gynecology

## 2021-01-11 ENCOUNTER — Telehealth (INDEPENDENT_AMBULATORY_CARE_PROVIDER_SITE_OTHER): Payer: Medicare HMO | Admitting: Obstetrics and Gynecology

## 2021-01-11 DIAGNOSIS — R978 Other abnormal tumor markers: Secondary | ICD-10-CM

## 2021-01-11 DIAGNOSIS — N9489 Other specified conditions associated with female genital organs and menstrual cycle: Secondary | ICD-10-CM | POA: Diagnosis not present

## 2021-01-11 DIAGNOSIS — R9389 Abnormal findings on diagnostic imaging of other specified body structures: Secondary | ICD-10-CM | POA: Diagnosis not present

## 2021-01-11 NOTE — Progress Notes (Signed)
HPI:      Michelle [name] is a 80 y.o. G0P0000 who LMP was No LMP recorded. Patient is postmenopausal.  Subjective:   She presents today to discuss her ultrasound findings as well as her Roma test.  She continues to experience no issues with pelvic pain or other pelvic symptoms including vaginal bleeding.    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 She does not have any pertinent problems on file. She  has a past surgical history that includes Cardiac catheterization (09/2004); Cardiac catheterization (08/2004); Cardiac catheterization (N/A, 07/04/2015); IR Fluoro Guide Ndl Plmt / BX (06/18/2018); PORTA CATH REMOVAL (N/A, 06/23/2018); RIGHT/LEFT HEART CATH AND CORONARY ANGIOGRAPHY (Bilateral, 09/20/2019); and Breast biopsy (Left, 11/07/2020). Her family history includes Arthritis in her brother; Cholelithiasis in her mother; Diabetes in her mother and sister; Heart murmur in her sister; Hypertension in her sister. She  reports that she has never smoked. She has never used smokeless tobacco. She reports that she does not drink alcohol and does not use drugs. She has a current medication list which includes the following prescription(s): calquence, acetaminophen, albuterol, carvedilol, cyanocobalamin, fluticasone, furosemide, losartan, krill oil, meloxicam, centrum silver 50+women, multiple vitamins-minerals, ondansetron, rosuvastatin, and spironolactone, and the following Facility-Administered Medications: heparin lock flush, sodium chloride flush, sodium chloride flush, and tbo-filgrastim. She has No Known  Allergies.       Review of Systems:  Review of Systems  Constitutional: Denied constitutional symptoms, night sweats, recent illness, fatigue, fever, insomnia and weight loss.  Eyes: Denied eye symptoms, eye pain, photophobia, vision change and visual disturbance.  Ears/Nose/Throat/Neck: Denied ear, nose, throat or neck symptoms, hearing loss, nasal discharge, sinus congestion and sore throat.  Cardiovascular: Denied cardiovascular symptoms, arrhythmia, chest pain/pressure, edema, exercise intolerance, orthopnea and palpitations.  Respiratory: Denied pulmonary symptoms, asthma, pleuritic pain, productive sputum, cough, dyspnea and wheezing.  Gastrointestinal: Denied, gastro-esophageal reflux, melena, nausea and vomiting.  Genitourinary: Denied genitourinary symptoms including symptomatic vaginal discharge, pelvic relaxation issues, and urinary complaints.  Musculoskeletal: Denied musculoskeletal symptoms, stiffness, swelling, muscle weakness and myalgia.  Dermatologic: Denied dermatology symptoms, rash and scar.  Neurologic: Denied neurology symptoms, dizziness, headache, neck pain and syncope.  Psychiatric: Denied psychiatric symptoms, anxiety and depression.  Endocrine: Denied endocrine symptoms including hot flashes and night sweats.   Meds:   Current Outpatient Medications on File Prior to Visit  Medication Sig Dispense Refill   acalabrutinib (CALQUENCE) 100 MG capsule Take 1 capsule (100 mg total) by mouth 2 (two) times daily. 60 capsule 2   acetaminophen (TYLENOL) 500 MG tablet Take 1,000 mg by mouth every 6 (six) hours as needed for mild pain.      albuterol (VENTOLIN HFA) 108 (90 Base) MCG/ACT inhaler TAKE 2 PUFFS BY MOUTH EVERY 6 HOURS AS NEEDED FOR WHEEZE OR SHORTNESS OF BREATH 18 each 1   carvedilol (COREG) 3.125 MG tablet TAKE 1 TABLET BY MOUTH 2 TIMES DAILY. 180 tablet 0   cyanocobalamin 2000 MCG tablet Take 2,500 mcg by mouth daily.      fluticasone (FLONASE) 50 MCG/ACT nasal  spray Place 2 sprays into both nostrils daily as needed for allergies. 16 g 4  furosemide (LASIX) 40 MG tablet TAKE 1 TABLET BY MOUTH EVERY DAY 90 tablet 0   losartan (COZAAR) 25 MG tablet TAKE 1 TABLET BY MOUTH EVERY DAY 90 tablet 1   MEGARED OMEGA-3 KRILL OIL PO Take 1 tablet by mouth daily.     meloxicam (MOBIC) 7.5 MG tablet Take 1 tablet (7.5 mg total) by mouth daily as needed for pain. 30 tablet 1   Multiple Vitamins-Minerals (CENTRUM SILVER 50+WOMEN) TABS Take by mouth.     Multiple Vitamins-Minerals (HAIR SKIN AND NAILS FORMULA PO) Take by mouth.     ondansetron (ZOFRAN) 8 MG tablet One pill every 8 hours as needed for nausea/vomitting. 40 tablet 1   rosuvastatin (CRESTOR) 5 MG tablet Take 1 tablet (5 mg total) by mouth daily. 90 tablet 3   spironolactone (ALDACTONE) 25 MG tablet Take 0.5 tablets (12.5 mg total) by mouth daily. 45 tablet 3   Current Facility-Administered Medications on File Prior to Visit  Medication Dose Route Frequency Provider Last Rate Last Admin   heparin lock flush 100 unit/mL  500 Units Intravenous Once Charlaine Dalton R, MD       sodium chloride flush (NS) 0.9 % injection 10 mL  10 mL Intravenous Once Charlaine Dalton R, MD       sodium chloride flush (NS) 0.9 % injection 10 mL  10 mL Intravenous Once Cammie Sickle, MD       Tbo-Filgrastim [facility]) injection 480 mcg  480 mcg Subcutaneous Once Cammie Sickle, MD          Objective:     There were no vitals filed for this visit. There were no vitals filed for this visit.            Ultrasound results, lab results, pathology results reviewed directly with the patient.   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 Adnexal mass   2. Thickened endometrium   3. Elevated tumor markers     Patient has an adnexal mass with elevated He4 making her overall Roma score elevated for a postmenopausal woman.  Nevertheless the ultrasound appearance and this low score with a negative CA125 makes ovarian cancer unlikely.  Endometrial biopsy for thickened endometrium showed no hyperplasia or malignancy   Plan:            1.  I discussed the results in detail with the patient.  I have also taken the liberty to discuss these findings with Dr. Fransisca Connors earlier today.  He and I both believe that the cystic appearance of the adnexal structure is likely benign and that the mildly elevated Roma is likely not significant in this case. I recommended a  follow-up ultrasound in 3 months which is consistent with Dr. Blake Divine recommendation as well.  Should the ultrasound appearance change significantly further work-up including MRI could be considered.  I have discussed this with the patient.  All of her questions were answered.  Orders Orders Placed This Encounter  Procedures   US PELVIS (TRANSABDOMINAL ONLY)   US PELVIS TRANSVAGINAL NON-OB (TV ONLY)    No orders of the defined types were placed in this encounter.     F/U  Return for We will contact her with any abnormal test results. I spent 22 minutes involved in the care of this patient preparing to see the patient by  obtaining and reviewing her medical history (including labs, imaging tests and prior procedures), documenting clinical information in the electronic health record (EHR), counseling and coordinating care plans, writing and sending prescriptions, ordering tests or procedures and in direct communicating with the patient and medical staff discussing pertinent items from her history and physical exam.  Michelle [name], M.D. 01/11/2021 12:44 PM

## 2021-01-11 NOTE — Telephone Encounter (Signed)
Ms. lilley, fleege are scheduled for a virtual visit with your provider today.    Just as we do with appointments in the office, we must obtain your consent to participate.  Your consent will be active for this visit and any virtual visit you may have with one of our providers in the next 365 days.    If you have a MyChart account, I can also send a copy of this consent to you electronically.  All virtual visits are billed to your insurance company just like a traditional visit in the office.  As this is a virtual visit, video technology does not allow for your provider to perform a traditional examination.  This may limit your provider's ability to fully assess your condition.  If your provider identifies any concerns that need to be evaluated in person or the need to arrange testing such as labs, EKG, etc, we will make arrangements to do so.    Although advances in technology are sophisticated, we cannot ensure that it will always work on either your end or our end.  If the connection with a video visit is poor, we may have to switch to a telephone visit.  With either a video or telephone visit, we are not always able to ensure that we have a secure connection.   I need to obtain your verbal consent now.   Are you willing to proceed with your visit today?   DONESHIA CATER has provided verbal consent on 01/11/2021 for a virtual visit (video or telephone).   Joyice Faster, [facility] 01/11/2021  11:22 AM

## 2021-01-14 ENCOUNTER — Other Ambulatory Visit (HOSPITAL_COMMUNITY): Payer: Self-pay

## 2021-01-14 ENCOUNTER — Inpatient Hospital Stay: Payer: Medicare HMO | Admitting: Pharmacist

## 2021-01-14 ENCOUNTER — Inpatient Hospital Stay: Payer: Medicare HMO

## 2021-01-14 ENCOUNTER — Other Ambulatory Visit: Payer: Self-pay

## 2021-01-14 ENCOUNTER — Encounter: Payer: Self-pay | Admitting: Internal Medicine

## 2021-01-14 ENCOUNTER — Inpatient Hospital Stay: Payer: Medicare HMO | Admitting: Internal Medicine

## 2021-01-14 DIAGNOSIS — C8311 Mantle cell lymphoma, lymph nodes of head, face, and neck: Secondary | ICD-10-CM

## 2021-01-14 DIAGNOSIS — D709 Neutropenia, unspecified: Secondary | ICD-10-CM | POA: Diagnosis not present

## 2021-01-14 DIAGNOSIS — M549 Dorsalgia, unspecified: Secondary | ICD-10-CM | POA: Diagnosis not present

## 2021-01-14 DIAGNOSIS — I272 Pulmonary hypertension, unspecified: Secondary | ICD-10-CM | POA: Diagnosis not present

## 2021-01-14 DIAGNOSIS — C8318 Mantle cell lymphoma, lymph nodes of multiple sites: Secondary | ICD-10-CM | POA: Diagnosis not present

## 2021-01-14 DIAGNOSIS — E785 Hyperlipidemia, unspecified: Secondary | ICD-10-CM | POA: Diagnosis not present

## 2021-01-14 DIAGNOSIS — M2550 Pain in unspecified joint: Secondary | ICD-10-CM | POA: Diagnosis not present

## 2021-01-14 DIAGNOSIS — I428 Other cardiomyopathies: Secondary | ICD-10-CM | POA: Diagnosis not present

## 2021-01-14 DIAGNOSIS — M069 Rheumatoid arthritis, unspecified: Secondary | ICD-10-CM | POA: Diagnosis not present

## 2021-01-14 DIAGNOSIS — Z298 Encounter for other specified prophylactic measures: Secondary | ICD-10-CM | POA: Diagnosis not present

## 2021-01-14 LAB — CBC WITH DIFFERENTIAL/PLATELET
Abs Immature Granulocytes: 0.03 10*3/uL (ref 0.00–0.07)
Basophils Absolute: 0 10*3/uL (ref 0.0–0.1)
Basophils Relative: 0 %
Eosinophils Absolute: 0.2 10*3/uL (ref 0.0–0.5)
Eosinophils Relative: 3 %
HCT: 40 % (ref 36.0–46.0)
Hemoglobin: 12.6 g/dL (ref 12.0–15.0)
Immature Granulocytes: 1 %
Lymphocytes Relative: 23 %
Lymphs Abs: 1.4 10*3/uL (ref 0.7–4.0)
MCH: 29.2 pg (ref 26.0–34.0)
MCHC: 31.5 g/dL (ref 30.0–36.0)
MCV: 92.8 fL (ref 80.0–100.0)
Monocytes Absolute: 0.4 10*3/uL (ref 0.1–1.0)
Monocytes Relative: 6 %
Neutro Abs: 4.1 10*3/uL (ref 1.7–7.7)
Neutrophils Relative %: 67 %
Platelets: 147 10*3/uL — ABNORMAL LOW (ref 150–400)
RBC: 4.31 MIL/uL (ref 3.87–5.11)
RDW: 14.3 % (ref 11.5–15.5)
WBC: 6.2 10*3/uL (ref 4.0–10.5)
nRBC: 0 % (ref 0.0–0.2)

## 2021-01-14 LAB — LACTATE DEHYDROGENASE: LDH: 173 U/L (ref 98–192)

## 2021-01-14 LAB — BASIC METABOLIC PANEL
Anion gap: 8 (ref 5–15)
BUN: 18 mg/dL (ref 8–23)
CO2: 27 mmol/L (ref 22–32)
Calcium: 8.8 mg/dL — ABNORMAL LOW (ref 8.9–10.3)
Chloride: 102 mmol/L (ref 98–111)
Creatinine, Ser: 0.76 mg/dL (ref 0.44–1.00)
GFR, Estimated: >60 mL/min (ref >60)
Glucose, Bld: 103 mg/dL — ABNORMAL HIGH (ref 70–99)
Potassium: 3.6 mmol/L (ref 3.5–5.1)
Sodium: 137 mmol/L (ref 135–145)

## 2021-01-14 NOTE — Progress Notes (Signed)
[place]  Telephone:([phone] [phone]  Patient Care Team: Burnard Hawthorne, FNP as PCP - General (Family Medicine) End, Harrell Gave, MD as PCP - Cardiology (Cardiology) Deboraha Sprang, MD as PCP - Electrophysiology (Cardiology) Jackolyn Confer, MD (Internal Medicine) Cammie Sickle, MD as Consulting Physician (Internal Medicine)   Name of the patient: Michelle [name]  HQ:[id number]  [birth year]   Date of visit: 01/14/21  HPI: Patient is a 80 y.o. female with recurrent stage IV mantle cell lymphoma. Patient was on ibrutinib but this was stopped due to development of a full body rash. She started treatment with Calquence (acalabrutinib) on 12/31/20.  Reason for Consult: Oral chemotherapy follow-up for acalabrutinib therapy.   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 Feb 9th- START Benda-Ritux with neulasta; Prolonged neutropenia; DISCONT- Benda-Ritux;   # April 13 th 2017- START R-CHOP x1; severe/prolonged neutropenia; PET- CR; BMBx-Neg; Disc R-CHOP  # 26th MAY 2017- Start Rituxan q 27M Main OCT 12th 2017- PET NED.  Stop Rituxan maintenance x 2 years [aug 2019-pneumonia]  # June 2022- DIAGNOSIS: thickened cortex of 12 mm. An  additional lymph node demonstrates a thickened cortex of 7 mm. A third lymph node demonstrates a mildly thickened cortex of 4 mm A. LYMPH NODE, LEFT AXILLA; ULTRASOUND-GUIDED BIOPSY:  - CD5+ MONOCLONAL B-CELL POPULATION; COMPATIBLE WITH INVOLVEMENT BY THE  PATIENT'S KNOWN MANTLE CELL LYMPHOMA.   #July 2022-second week-started ibrutinib 420 mg a dayx 1 week-stop because of severe rash.  Significant clinical response noted.  # AUG 1st, 2022- start acalbrutinib.   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  MEDICATIONS:  Current Outpatient Medications  Medication Sig Dispense Refill   acalabrutinib (CALQUENCE) 100 MG capsule Take 1 capsule (100 mg total) by mouth 2 (two) times daily. 60 capsule 2   acetaminophen (TYLENOL) 500 MG tablet Take 1,000 mg by mouth every 6 (six) hours as needed for mild pain.      albuterol (VENTOLIN HFA) 108 (90 Base) MCG/ACT inhaler TAKE 2 PUFFS BY MOUTH EVERY 6 HOURS AS NEEDED FOR WHEEZE OR SHORTNESS OF BREATH 18 each 1   carvedilol (COREG) 3.125 MG tablet TAKE 1 TABLET BY MOUTH 2 TIMES DAILY. 180 tablet 0   cyanocobalamin 2000 MCG tablet Take 2,500 mcg by mouth daily.      fluticasone (FLONASE) 50 MCG/ACT nasal spray Place 2 sprays into both nostrils daily as needed for allergies. [place]  g 4   furosemide (LASIX) 40 MG tablet TAKE 1 TABLET BY MOUTH EVERY DAY 90 tablet 0   losartan (COZAAR) 25 MG tablet TAKE 1 TABLET BY MOUTH EVERY DAY 90 tablet 1   MEGARED OMEGA-3 KRILL OIL PO Take 1 tablet by mouth daily.     meloxicam (MOBIC) 7.5 MG tablet Take 1 tablet (7.5 mg total) by mouth daily as needed for pain. 30 tablet 1   Multiple Vitamins-Minerals (CENTRUM  SILVER 50+WOMEN) TABS Take by mouth.     Multiple Vitamins-Minerals (HAIR SKIN AND NAILS FORMULA PO) Take by mouth.     rosuvastatin (CRESTOR) 5 MG tablet Take 1 tablet (5 mg total) by mouth daily. 90 tablet 3   spironolactone (ALDACTONE) 25 MG tablet Take 0.5 tablets (12.5 mg total) by mouth daily. 45 tablet 3   No current facility-administered medications for this visit.   Facility-Administered Medications Ordered in Other Visits  Medication Dose Route Frequency Provider Last Rate Last Admin   heparin lock flush 100 unit/mL  500 Units Intravenous Once Charlaine Dalton R, MD       sodium chloride flush (NS) 0.9 % injection 10 mL  10 mL Intravenous Once Charlaine Dalton R, MD       sodium chloride flush (NS) 0.9 % injection 10 mL  10 mL Intravenous Once Cammie Sickle, MD       Tbo-Filgrastim [facility]) injection 480 mcg  480 mcg Subcutaneous Once Cammie Sickle, MD        VITAL SIGNS: There were no vitals taken for this visit. There were no vitals filed for this visit.  Estimated body mass index is 39 kg/m as calculated from the following:   Height as of an earlier encounter on 01/14/21: '5\' 7"'$  (1.702 m).   Weight as of an earlier encounter on 01/14/21: 112.9 kg (249 lb).  LABS: CBC:    Component Value Date/Time   WBC 6.2 01/14/2021 1007   HGB 12.6 01/14/2021 1007   HGB 13.4 09/08/2019 1359   HCT 40.0 01/14/2021 1007   HCT 40.9 09/08/2019 1359   PLT 147 (L) 01/14/2021 1007   PLT 194 09/08/2019 1359   MCV 92.8 01/14/2021 1007   MCV 90 09/08/2019 1359   MCV 89 09/14/2013 1127   NEUTROABS 4.1 01/14/2021 1007   NEUTROABS 3.8 09/08/2019 1359   LYMPHSABS 1.4 01/14/2021 1007   LYMPHSABS 1.3 09/08/2019 1359   MONOABS 0.4 01/14/2021 1007   EOSABS 0.2 01/14/2021 1007   EOSABS 0.1 09/08/2019 1359   BASOSABS 0.0 01/14/2021 1007   BASOSABS 0.0 09/08/2019 1359   Comprehensive Metabolic Panel:    Component Value Date/Time   NA 137 01/14/2021 1007   NA 145 (H)  09/08/2019 1359   NA 142 09/14/2013 1127   K 3.6 01/14/2021 1007   K 3.5 09/14/2013 1127   CL 102 01/14/2021 1007   CL 110 (H) 09/14/2013 1127   CO2 27 01/14/2021 1007   CO2 29 09/14/2013 1127   BUN 18 01/14/2021 1007   BUN 15 09/08/2019 1359   BUN 8 09/14/2013 1127   CREATININE 0.76 01/14/2021 1007   CREATININE 0.71 09/14/2013 1127   CREATININE 0.68 04/01/2013 1550   GLUCOSE 103 (H) 01/14/2021 1007   GLUCOSE 106 (H) 09/14/2013 1127   CALCIUM 8.8 (L) 01/14/2021 1007   CALCIUM 8.6 09/14/2013 1127   AST 22 11/21/2020 0855   AST 22 09/14/2013 1127   ALT 14 11/21/2020 0855   ALT 19 09/14/2013 1127   ALKPHOS  72 11/21/2020 0855   ALKPHOS 76 09/14/2013 1127   BILITOT 0.9 11/21/2020 0855   BILITOT 0.4 09/14/2013 1127   PROT 6.6 11/21/2020 0855   PROT 7.6 09/14/2013 1127   ALBUMIN 3.7 11/21/2020 0855   ALBUMIN 3.2 (L) 09/14/2013 1127     Present during today's visit: patient and her husband  Assessment and Plan: Continue acalabrutinib '100mg'$  twice daily   Oral Chemotherapy Side Effect/Intolerance:  Headache: she reported one headache since initiation, she drank a Pepsi and the headache went away No reported diarrhea, rash, or muscle pain  Other:  She reported shoulder pain but believes this is arthritis related, she has arthritis pain in other joints Constipation: patient could not remember the name of her at home constipation medication be she call me back to let me know it was senna. Suggested she add on docusate to her regimen. She reported that Miralax has not helped her in the past.   Oral Chemotherapy Adherence: no missed doses reported No patient barriers to medication adherence identified.   New medications: No new medications reported  Medication Access Issues: No issues, next refill from [facility] set-up during appt today. Expected delivery 8/26  Patient expressed understanding and was in agreement with this plan. She also understands that She can  call clinic at any time with any questions, concerns, or complaints.   Follow-up plan: f/u with MD in one month  Thank you for allowing me to participate in the care of this very pleasant patient.   Time Total: 15 mins  Visit consisted of counseling and education on dealing with issues of symptom management in the setting of serious and potentially life-threatening illness.Greater than 50%  of this time was spent counseling and coordinating care related to the above assessment and plan.  Signed by: Darl Pikes, PharmD, BCPS, Salley Slaughter, CPP Hematology/Oncology Clinical Pharmacist Practitioner [facility]/HP/AP [facility] [phone]  01/14/2021 1:23 PM

## 2021-01-14 NOTE — Progress Notes (Signed)
[facility] OFFICE PROGRESS NOTE  Patient Care Team: Burnard Hawthorne, FNP as PCP - General (Family Medicine) End, Harrell Gave, MD as PCP - Cardiology (Cardiology) Deboraha Sprang, MD as PCP - Electrophysiology (Cardiology) Jackolyn Confer, MD (Internal Medicine) Cammie Sickle, MD as Consulting Physician (Internal Medicine)  Cancer Staging No matching staging information was found for the patient.   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 Feb 9th- START Benda-Ritux with neulasta; Prolonged neutropenia; DISCONT- Benda-Ritux;   # April 13 th 2017- START R-CHOP x1; severe/prolonged neutropenia; PET- CR; BMBx-Neg; Disc R-CHOP  # 26th MAY 2017- Start Rituxan q 18M Main OCT 12th 2017- PET NED.  Stop Rituxan maintenance x 2 years [aug 2019-pneumonia]  # June 2022- DIAGNOSIS: thickened cortex of 12 mm. An additional lymph node demonstrates a thickened cortex of 7 mm. A third lymph node demonstrates a mildly thickened cortex of 4 mm A. LYMPH NODE, LEFT AXILLA; ULTRASOUND-GUIDED BIOPSY:  - CD5+ MONOCLONAL B-CELL POPULATION; COMPATIBLE WITH INVOLVEMENT BY THE  PATIENT'S KNOWN MANTLE CELL LYMPHOMA.   #July 2022-second week-started ibrutinib 420 mg a dayx 1 week-stop because of severe rash.  Significant clinical response noted.  # AUG 1st, 2022- start acalbrutinib.   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  Patient admits to intermittent headaches-for which she has been taking caffeinated drinks seems to be helping.  Otherwise no rash.  No nausea no vomiting.  No fever no chills.  No chest pain or shortness of breath or cough.  Complains of mild right shoulder pain not any worse.   Review of Systems  Constitutional:  Negative for chills, diaphoresis, fever, malaise/fatigue and weight loss.  HENT:  Negative for nosebleeds and sore throat.   Eyes:  Negative for double vision.  Respiratory:  Negative for hemoptysis, sputum production and wheezing.   Cardiovascular:  Negative for chest pain, palpitations, orthopnea and leg swelling.  Gastrointestinal:  Negative for abdominal pain, blood in stool, constipation, diarrhea, heartburn, melena, nausea and vomiting.  Genitourinary:  Negative for dysuria, frequency and urgency.  Musculoskeletal:  Positive for back pain and joint pain.  Skin: Negative.  Negative for itching and rash.  Neurological:  Negative for dizziness, tingling, focal weakness, weakness and headaches.  Endo/Heme/Allergies:  Does not bruise/bleed easily.  Psychiatric/Behavioral:  Negative for depression. The patient is not nervous/anxious and does not have insomnia.    PAST MEDICAL HISTORY :  Past Medical History:  Diagnosis Date  . Arthritis   . Collagen vascular disease ([place])   . GERD (gastroesophageal reflux disease)   . Headache(784.0)   . History of methotrexate therapy   . Hyperlipidemia    hx  . Lymphadenopathy of head and neck 01/2015   see on Thyroid ultrasound  . Lymphoma, mantle cell ([place]) 06/01/2015   bx of lymph node in right breast/Stage IV Mantle Cell Lymphoma  . Personal history of  chemotherapy   . Rheumatoid arthritis ([place])     PAST SURGICAL HISTORY :   Past Surgical History:  Procedure Laterality Date  . BREAST BIOPSY Left 11/07/2020   u/s bx-hydromark #3 "coil"-path pending  . CARDIAC CATHETERIZATION   09/2004   [facility]; EF 60%  . CARDIAC CATHETERIZATION  08/2004   [facility]  . IR FLUORO GUIDED NEEDLE PLC ASPIRATION/INJECTION LOC  06/18/2018  . PERIPHERAL VASCULAR CATHETERIZATION N/A 07/04/2015   Procedure: [name] Insertion;  Surgeon: [name], MD;  Location: [facility];  Service: Cardiovascular;  Laterality: N/A;  . PORTA CATH REMOVAL N/A 06/23/2018   Procedure: PORTA CATH REMOVAL;  Surgeon: [name], MD;  Location: [place] [facility];  Service: Cardiovascular;  Laterality: N/A;  . RIGHT/LEFT HEART CATH AND CORONARY ANGIOGRAPHY Bilateral 09/20/2019   Procedure: RIGHT/LEFT HEART CATH AND CORONARY ANGIOGRAPHY;  Surgeon: Nelva Bush, MD;  Location: [facility] [facility];  Service: Cardiovascular;  Laterality: Bilateral;    FAMILY HISTORY :   Family History  Problem Relation Age of Onset  . Diabetes Mother   . Cholelithiasis Mother   . Hypertension Sister   . Diabetes Sister   . Heart murmur Sister   . Arthritis Brother   . Breast cancer Neg Hx     SOCIAL HISTORY:   Social History   Tobacco Use  . Smoking status: Never  . Smokeless tobacco: Never  Vaping Use  . Vaping Use: Never used  Substance Use Topics  . Alcohol use: No  . Drug use: No    ALLERGIES:  has No Known Allergies.  MEDICATIONS:  Current Outpatient Medications  Medication Sig Dispense Refill  . acalabrutinib (CALQUENCE) 100 MG capsule Take 1 capsule (100 mg total) by mouth 2 (two) times daily. 60 capsule 2  . acetaminophen (TYLENOL) 500 MG tablet Take 1,000 mg by mouth every 6 (six) hours as needed for mild pain.     [name] albuterol (VENTOLIN HFA) 108 (90 Base) MCG/ACT inhaler TAKE 2 PUFFS BY MOUTH EVERY 6 HOURS AS NEEDED FOR WHEEZE OR SHORTNESS OF BREATH 18 each 1  . carvedilol (COREG) 3.125 MG tablet TAKE 1 TABLET BY MOUTH 2 TIMES DAILY. 180 tablet 0  . cyanocobalamin 2000 MCG tablet Take 2,500 mcg by mouth daily.     . fluticasone (FLONASE) 50 MCG/ACT nasal spray Place 2 sprays into both  nostrils daily as needed for allergies. 16 g 4  . furosemide (LASIX) 40 MG tablet TAKE 1 TABLET BY MOUTH EVERY DAY 90 tablet 0  . losartan (COZAAR) 25 MG tablet TAKE 1 TABLET BY MOUTH EVERY DAY 90 tablet 1  . MEGARED OMEGA-3 KRILL OIL PO Take 1 tablet by mouth daily.    . meloxicam (MOBIC) 7.5 MG tablet Take 1 tablet (7.5 mg total) by mouth daily as needed for pain. 30 tablet 1  . Multiple Vitamins-Minerals (CENTRUM SILVER 50+WOMEN) TABS Take by mouth.    . Multiple Vitamins-Minerals (HAIR SKIN AND NAILS FORMULA PO) Take by mouth.    . rosuvastatin (CRESTOR) 5 MG tablet Take 1 tablet (5 mg total) by mouth daily. 90 tablet 3  . spironolactone (ALDACTONE) 25 MG tablet Take 0.5 tablets (12.5 mg total) by mouth daily. 45 tablet 3   No current facility-administered medications for this visit.   Facility-Administered Medications Ordered in Other Visits  Medication Dose Route Frequency Provider Last Rate Last Admin  . heparin lock flush 100 unit/mL  500 Units Intravenous Once Cammie Sickle, MD      .  sodium chloride flush (NS) 0.9 % injection 10 mL  10 mL Intravenous Once Charlaine Dalton R, MD      . sodium chloride flush (NS) 0.9 % injection 10 mL  10 mL Intravenous Once Cammie Sickle, MD      . Tbo-Filgrastim [facility]) injection 480 mcg  480 mcg Subcutaneous Once Cammie Sickle, MD        PHYSICAL EXAMINATION: ECOG PERFORMANCE STATUS: 0 - Asymptomatic  BP 118/66   Pulse 66   Temp (!) 96.2 F (35.7 C) (Tympanic)   Resp 20   Ht '5\' 7"'$  (1.702 m)   Wt 249 lb (112.9 kg)   BMI 39.00 kg/m   Filed Weights   01/14/21 1046  Weight: 249 lb (112.9 kg)    Physical Exam Constitutional:      Comments: Obese.  Walking herself with cane.  Accompanied by her sister.  HENT:     Head: Normocephalic and atraumatic.     Mouth/Throat:     Pharynx: No oropharyngeal exudate.  Eyes:     Pupils: Pupils are equal, round, and reactive to light.  Cardiovascular:     Rate and  Rhythm: Normal rate and regular rhythm.  Pulmonary:     Effort: No respiratory distress.     Breath sounds: No wheezing.  Abdominal:     General: Bowel sounds are normal. There is no distension.     Palpations: Abdomen is soft. There is no mass.     Tenderness: There is no abdominal tenderness. There is no guarding or rebound.  Musculoskeletal:        General: No tenderness. Normal range of motion.     Cervical back: Normal range of motion and neck supple.  Skin:    General: Skin is warm.     Comments: Resolution of the 4 to 5 cm soft mass noted on the left posterior shoulder.  And also resolution of the left axillary lymphadenopathy.  Neurological:     Mental Status: She is alert and oriented to person, place, and time.  Psychiatric:        Mood and Affect: Affect normal.   LABORATORY DATA:  I have reviewed the data as listed    Component Value Date/Time   NA 137 01/14/2021 1007   NA 145 (H) 09/08/2019 1359   NA 142 09/14/2013 1127   K 3.6 01/14/2021 1007   K 3.5 09/14/2013 1127   CL 102 01/14/2021 1007   CL 110 (H) 09/14/2013 1127   CO2 27 01/14/2021 1007   CO2 29 09/14/2013 1127   GLUCOSE 103 (H) 01/14/2021 1007   GLUCOSE 106 (H) 09/14/2013 1127   BUN 18 01/14/2021 1007   BUN 15 09/08/2019 1359   BUN 8 09/14/2013 1127   CREATININE 0.76 01/14/2021 1007   CREATININE 0.71 09/14/2013 1127   CREATININE 0.68 04/01/2013 1550   CALCIUM 8.8 (L) 01/14/2021 1007   CALCIUM 8.6 09/14/2013 1127   PROT 6.6 11/21/2020 0855   PROT 7.6 09/14/2013 1127   ALBUMIN 3.7 11/21/2020 0855   ALBUMIN 3.2 (L) 09/14/2013 1127   AST 22 11/21/2020 0855   AST 22 09/14/2013 1127   ALT 14 11/21/2020 0855   ALT 19 09/14/2013 1127   ALKPHOS 72 11/21/2020 0855   ALKPHOS 76 09/14/2013 1127   BILITOT 0.9 11/21/2020 0855   BILITOT 0.4 09/14/2013 1127   GFRNONAA >60 01/14/2021 1007   GFRNONAA >60 09/14/2013 1127   GFRAA >60 01/20/2020 1303   GFRAA >60 09/14/2013 1127  No results found for:  SPEP, UPEP  Lab Results  Component Value Date   WBC 6.2 01/14/2021   NEUTROABS 4.1 01/14/2021   HGB 12.6 01/14/2021   HCT 40.0 01/14/2021   MCV 92.8 01/14/2021   PLT 147 (L) 01/14/2021      Chemistry      Component Value Date/Time   NA 137 01/14/2021 1007   NA 145 (H) 09/08/2019 1359   NA 142 09/14/2013 1127   K 3.6 01/14/2021 1007   K 3.5 09/14/2013 1127   CL 102 01/14/2021 1007   CL 110 (H) 09/14/2013 1127   CO2 27 01/14/2021 1007   CO2 29 09/14/2013 1127   BUN 18 01/14/2021 1007   BUN 15 09/08/2019 1359   BUN 8 09/14/2013 1127   CREATININE 0.76 01/14/2021 1007   CREATININE 0.71 09/14/2013 1127   CREATININE 0.68 04/01/2013 1550      Component Value Date/Time   CALCIUM 8.8 (L) 01/14/2021 1007   CALCIUM 8.6 09/14/2013 1127   ALKPHOS 72 11/21/2020 0855   ALKPHOS 76 09/14/2013 1127   AST 22 11/21/2020 0855   AST 22 09/14/2013 1127   ALT 14 11/21/2020 0855   ALT 19 09/14/2013 1127   BILITOT 0.9 11/21/2020 0855   BILITOT 0.4 09/14/2013 1127       RADIOGRAPHIC STUDIES: I have personally reviewed the radiological images as listed and agreed with the findings in the report. No results found.   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  Clinically stable.  # Currently on Calequence 100 mg twice a day x AUG 1st.  Patient tolerating it well except for mild headaches improved with caffeinated drinks.  No rash.  No clinical evidence of any progression.  # Left shoulder pain/ Chronic arthritis-NSAIDs as needed- STABLE.   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  STABLE.  # Pulmonary hypertension/chronic bilateral lung scarring- [s/p pulmonary evaluation]-improved on albuterol-STABLE.   # DISPOSITION: # follow up-MD in 4 weeks/Friday; MD ;labs- cbc/bmp/ldh;.Dr.B            No orders of the defined types were placed in this encounter.  All questions were answered. The patient knows to call the clinic with any problems, questions or concerns.      Cammie Sickle, MD 01/14/2021 12:55 PM

## 2021-01-14 NOTE — Assessment & Plan Note (Addendum)
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  Clinically stable.  # Currently on Calequence 100 mg twice a day x AUG 1st.  Patient tolerating it well except for mild headaches improved with caffeinated drinks.  No rash.  No clinical evidence of any progression.  # Left shoulder pain/ Chronic arthritis-NSAIDs as needed- STABLE.   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  STABLE.  # Pulmonary hypertension/chronic bilateral lung scarring- [s/p pulmonary evaluation]-improved on albuterol-STABLE.   # DISPOSITION: # follow up-MD in 4 weeks/Friday; MD ;labs- cbc/bmp/ldh;.Dr.B

## 2021-01-18 ENCOUNTER — Other Ambulatory Visit (HOSPITAL_COMMUNITY): Payer: Self-pay

## 2021-01-21 ENCOUNTER — Encounter: Payer: Self-pay | Admitting: Family

## 2021-01-21 ENCOUNTER — Ambulatory Visit (INDEPENDENT_AMBULATORY_CARE_PROVIDER_SITE_OTHER): Payer: Medicare HMO | Admitting: Family

## 2021-01-21 ENCOUNTER — Telehealth: Payer: Self-pay | Admitting: Family

## 2021-01-21 ENCOUNTER — Other Ambulatory Visit: Payer: Self-pay

## 2021-01-21 DIAGNOSIS — C8311 Mantle cell lymphoma, lymph nodes of head, face, and neck: Secondary | ICD-10-CM | POA: Diagnosis not present

## 2021-01-21 DIAGNOSIS — G4733 Obstructive sleep apnea (adult) (pediatric): Secondary | ICD-10-CM

## 2021-01-21 DIAGNOSIS — I700 Atherosclerosis of aorta: Secondary | ICD-10-CM | POA: Diagnosis not present

## 2021-01-21 NOTE — Patient Instructions (Signed)
I will get back to you in regards to sleep study after talking with pulmonology

## 2021-01-21 NOTE — Telephone Encounter (Signed)
Michelle [name],   [facility] you are well  Does pt need cipap based mild osa seen 11/29/20?  Patient is here today and I wanted to clarify if she did or did not.  Wasn't sure what you recommended    If you think she needs to follow-up with you, please let me know that as well.   Best Joycelyn Schmid

## 2021-01-21 NOTE — Progress Notes (Signed)
Subjective:    Patient ID: Michelle [name], female    DOB: [birth year], 80 y.o.   MRN: [id number]  CC: Michelle [name] is a 80 y.o. female who presents today for follow up.   HPI: Feels well today No complaints  HLD-compliant with crestor '5mg'$ .   OSA- mild from report. No ha, fatigue.   Consult with Dr Halford Chessman for sleep study, mild osa 11/29/20   she continues to follow with, oncology Dr Michelle [name] for mantle cell lymphoma, seen 01/14/2021.  Several hypermetabolic left axillary and subpectoral lymph nodes consistent with recurrent lymphoma.  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  Continue carvedilol, losartan, spironolactone, furosemide at last visit 01/10/21  Consult with Dr. Amalia [name], for thickened endometrium, biopsy showed no hyperplasia or malignancy 12/28/20 HISTORY:  Past Medical History:  Diagnosis Date   Arthritis    Collagen vascular disease ([place])    GERD (gastroesophageal reflux disease)    Headache(784.0)    History of methotrexate therapy    Hyperlipidemia    hx   Lymphadenopathy of head and neck 01/2015   see on Thyroid ultrasound   Lymphoma, mantle cell ([place]) 06/01/2015   bx of lymph node in right breast/Stage IV Mantle Cell Lymphoma   Personal history of chemotherapy    Rheumatoid arthritis [facility])    Past Surgical History:  Procedure Laterality Date   BREAST BIOPSY Left 11/07/2020   u/s bx-hydromark #3 "coil"-path pending   CARDIAC CATHETERIZATION  09/2004   [place]; EF 60%   CARDIAC CATHETERIZATION  08/2004   [facility]   IR FLUORO GUIDED NEEDLE PLC ASPIRATION/INJECTION LOC  06/18/2018   PERIPHERAL VASCULAR CATHETERIZATION N/A 07/04/2015   Procedure: Michelle [name] Cath Insertion;  Surgeon: Michelle Huxley, MD;  Location: [facility] [facility];  Service: Cardiovascular;  Laterality: N/A;   PORTA CATH REMOVAL N/A 06/23/2018   Procedure: PORTA CATH REMOVAL;  Surgeon: Michelle Huxley, MD;   Location: [place] [facility];  Service: Cardiovascular;  Laterality: N/A;   RIGHT/LEFT HEART CATH AND CORONARY ANGIOGRAPHY Bilateral 09/20/2019   Procedure: RIGHT/LEFT HEART CATH AND CORONARY ANGIOGRAPHY;  Surgeon: Michelle Bush, MD;  Location: [place] [facility];  Service: Cardiovascular;  Laterality: Bilateral;   Family History  Problem Relation Age of Onset   Diabetes Mother    Cholelithiasis Mother    Hypertension Sister    Diabetes Sister    Heart murmur Sister    Arthritis Brother    Breast cancer Neg Hx     Allergies: Patient has no known allergies. Current Outpatient Medications on File Prior to Visit  Medication Sig Dispense Refill   acalabrutinib (CALQUENCE) 100 MG capsule Take 1 capsule (100 mg total) by mouth 2 (two) times daily. 60 capsule 2   acetaminophen (TYLENOL) 500 MG tablet Take 1,000 mg by mouth every 6 (six) hours as needed for mild pain.      albuterol (VENTOLIN HFA) 108 (90 Base) MCG/ACT inhaler TAKE 2 PUFFS BY MOUTH EVERY 6 HOURS AS NEEDED FOR WHEEZE OR SHORTNESS OF BREATH 18 each 1   carvedilol (COREG) 3.125 MG tablet TAKE 1 TABLET BY MOUTH 2 TIMES DAILY. 180 tablet 0   cyanocobalamin 2000 MCG tablet Take 2,500 mcg by mouth daily.      fluticasone (FLONASE) 50 MCG/ACT nasal spray Place 2 sprays into both nostrils daily as needed for allergies. 16 g 4   furosemide (LASIX) 40 MG tablet TAKE 1 TABLET  BY MOUTH EVERY DAY 90 tablet 0   losartan (COZAAR) 25 MG tablet TAKE 1 TABLET BY MOUTH EVERY DAY 90 tablet 1   MEGARED OMEGA-3 KRILL OIL PO Take 1 tablet by mouth daily.     meloxicam (MOBIC) 7.5 MG tablet Take 1 tablet (7.5 mg total) by mouth daily as needed for pain. 30 tablet 1   Multiple Vitamins-Minerals (CENTRUM SILVER 50+WOMEN) TABS Take by mouth.     Multiple Vitamins-Minerals (HAIR SKIN AND NAILS FORMULA PO) Take by mouth.     rosuvastatin (CRESTOR) 5 MG tablet Take 1 tablet (5 mg total) by mouth daily. 90 tablet 3   spironolactone (ALDACTONE) 25 MG  tablet Take 0.5 tablets (12.5 mg total) by mouth daily. 45 tablet 3   Current Facility-Administered Medications on File Prior to Visit  Medication Dose Route Frequency Provider Last Rate Last Admin   heparin lock flush 100 unit/mL  500 Units Intravenous Once Michelle [name] R, MD       sodium chloride flush (NS) 0.9 % injection 10 mL  10 mL Intravenous Once Michelle [name] R, MD       sodium chloride flush (NS) 0.9 % injection 10 mL  10 mL Intravenous Once Michelle Sickle, MD       Tbo-Filgrastim [facility]) injection 480 mcg  480 mcg Subcutaneous Once Michelle Sickle, MD        Social History   Tobacco Use   Smoking status: Never   Smokeless tobacco: Never  Vaping Use   Vaping Use: Never used  Substance Use Topics   Alcohol use: No   Drug use: No    Review of Systems  Constitutional:  Negative for chills and fever.  Respiratory:  Negative for cough.   Cardiovascular:  Negative for chest pain and palpitations.  Gastrointestinal:  Negative for nausea and vomiting.     Objective:    BP 118/70   Pulse 79   Temp 97.9 F (36.6 C)   Ht 5' 7.01" (1.702 m)   Wt 252 lb 6.4 oz (114.5 kg)   SpO2 97%   BMI 39.52 kg/m  BP Readings from Last 3 Encounters:  01/21/21 118/70  01/14/21 118/66  01/10/21 (!) 100/56   Wt Readings from Last 3 Encounters:  01/21/21 252 lb 6.4 oz (114.5 kg)  01/14/21 249 lb (112.9 kg)  01/10/21 247 lb 4 oz (112.2 kg)    Physical Exam Vitals reviewed.  Constitutional:      Appearance: She is well-developed.  Eyes:     Conjunctiva/sclera: Conjunctivae normal.  Cardiovascular:     Rate and Rhythm: Normal rate and regular rhythm.     Pulses: Normal pulses.     Heart sounds: Normal heart sounds.  Pulmonary:     Effort: Pulmonary effort is normal.     Breath sounds: Normal breath sounds. No wheezing, rhonchi or rales.  Skin:    General: Skin is warm and dry.  Neurological:     Mental Status: She is alert.  Psychiatric:         Speech: Speech normal.        Behavior: Behavior normal.        Thought Content: Thought content normal.       Assessment & Plan:   Problem List Items Addressed This Visit       Cardiovascular and Mediastinum   Atherosclerosis of aorta (HCC)    Chronic, stable.  LDL less than 100.  Continue crestor '5mg'$   Respiratory   OSA (obstructive sleep apnea)    Sleep study performed by pulmonology, reported as mild.  Message sent to pulmonology to clarify whether or not lifestyle  modifications versus CPAP are most appropriate.        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  Continue following with Dr Michelle [name].  Will follow        I am having [name] maintain her cyanocobalamin, acetaminophen, MEGARED OMEGA-3 KRILL OIL PO, albuterol, rosuvastatin, losartan, fluticasone, carvedilol, Multiple Vitamins-Minerals (HAIR SKIN AND NAILS FORMULA PO), Centrum Silver 50+Women, spironolactone, meloxicam, Calquence, and furosemide.   No orders of the defined types were placed in this encounter.   Return precautions given.   Risks, benefits, and alternatives of the medications and treatment plan prescribed today were discussed, and patient expressed understanding.   Education regarding symptom management and diagnosis given to patient on AVS.  Continue to follow with Burnard Hawthorne, FNP for routine health maintenance.   [name] and I agreed with plan.   Mable Paris, FNP

## 2021-01-24 ENCOUNTER — Other Ambulatory Visit (HOSPITAL_COMMUNITY): Payer: Self-pay

## 2021-01-24 DIAGNOSIS — G4733 Obstructive sleep apnea (adult) (pediatric): Secondary | ICD-10-CM | POA: Insufficient documentation

## 2021-01-24 NOTE — Assessment & Plan Note (Signed)
Chronic, stable.  LDL less than 100.  Continue crestor '5mg'$ 

## 2021-01-24 NOTE — Telephone Encounter (Signed)
Error

## 2021-01-24 NOTE — Assessment & Plan Note (Signed)
Sleep study performed by pulmonology, reported as mild.  Message sent to pulmonology to clarify whether or not lifestyle  modifications versus CPAP are most appropriate.

## 2021-01-24 NOTE — Assessment & Plan Note (Signed)
Chronic, stable.  Continue following with Dr Burlene Arnt.  Will follow

## 2021-01-25 ENCOUNTER — Encounter: Payer: Self-pay | Admitting: Obstetrics and Gynecology

## 2021-01-25 NOTE — Telephone Encounter (Signed)
She had mild OSA, typically we set the patient up for a televisit after to review results and discuss treatment options to determine which would be best for them. Her options would be oral appliance, CPAP or referral to ENT. I would recommend CPAP d/t her symptoms and imaging suggesting pulmonary HTN

## 2021-01-28 NOTE — Telephone Encounter (Signed)
Call patient Please let her know that I consulted with pulmonology as it relates to sleep apnea diagnosis and pulmonology  recommended a CPAP machine.  I would advise her to make a follow-up with pulmonology to discuss this in further detail as it relates to titration etc. She may schedule an appoint with Geraldo Pitter by calling [place] [phone]

## 2021-01-29 NOTE — Telephone Encounter (Signed)
Placed call to pt. Pt was given number to pulmonology and name of provider. Pt verbalized understanding.

## 2021-01-30 ENCOUNTER — Telehealth: Payer: Self-pay | Admitting: Primary Care

## 2021-01-30 NOTE — Telephone Encounter (Signed)
ATC patient- unable to leave vm due to mailbox being full.   

## 2021-01-31 NOTE — Telephone Encounter (Signed)
ATC x2--unable to leave vm due to mailbox being full. Will close encounter per office protocol.

## 2021-01-31 NOTE — Telephone Encounter (Signed)
ATC patient- unable to leave vm due to mailbox being full.   

## 2021-02-01 NOTE — Telephone Encounter (Signed)
See phone note- 12/07/20 Pt is scheduled for televisit per her request 02/11/21 to discuss results

## 2021-02-03 ENCOUNTER — Other Ambulatory Visit: Payer: Self-pay | Admitting: Family

## 2021-02-08 ENCOUNTER — Other Ambulatory Visit: Payer: Self-pay | Admitting: *Deleted

## 2021-02-08 DIAGNOSIS — C8311 Mantle cell lymphoma, lymph nodes of head, face, and neck: Secondary | ICD-10-CM

## 2021-02-11 ENCOUNTER — Encounter: Payer: Self-pay | Admitting: Internal Medicine

## 2021-02-11 ENCOUNTER — Other Ambulatory Visit: Payer: Self-pay

## 2021-02-11 ENCOUNTER — Inpatient Hospital Stay: Payer: Medicare HMO | Admitting: Internal Medicine

## 2021-02-11 ENCOUNTER — Encounter: Payer: Self-pay | Admitting: Primary Care

## 2021-02-11 ENCOUNTER — Telehealth: Payer: Self-pay | Admitting: Primary Care

## 2021-02-11 ENCOUNTER — Inpatient Hospital Stay: Payer: Medicare HMO | Attending: Internal Medicine

## 2021-02-11 ENCOUNTER — Ambulatory Visit (INDEPENDENT_AMBULATORY_CARE_PROVIDER_SITE_OTHER): Payer: Medicare HMO | Admitting: Primary Care

## 2021-02-11 DIAGNOSIS — C8311 Mantle cell lymphoma, lymph nodes of head, face, and neck: Secondary | ICD-10-CM | POA: Diagnosis not present

## 2021-02-11 DIAGNOSIS — Z5112 Encounter for antineoplastic immunotherapy: Secondary | ICD-10-CM | POA: Insufficient documentation

## 2021-02-11 DIAGNOSIS — R0683 Snoring: Secondary | ICD-10-CM | POA: Diagnosis not present

## 2021-02-11 DIAGNOSIS — G4733 Obstructive sleep apnea (adult) (pediatric): Secondary | ICD-10-CM | POA: Diagnosis not present

## 2021-02-11 LAB — BASIC METABOLIC PANEL
Anion gap: 6 (ref 5–15)
BUN: 16 mg/dL (ref 8–23)
CO2: 30 mmol/L (ref 22–32)
Calcium: 8.7 mg/dL — ABNORMAL LOW (ref 8.9–10.3)
Chloride: 103 mmol/L (ref 98–111)
Creatinine, Ser: 0.73 mg/dL (ref 0.44–1.00)
GFR, Estimated: >60 mL/min (ref >60)
Glucose, Bld: 93 mg/dL (ref 70–99)
Potassium: 3.7 mmol/L (ref 3.5–5.1)
Sodium: 139 mmol/L (ref 135–145)

## 2021-02-11 LAB — CBC WITH DIFFERENTIAL/PLATELET
Abs Immature Granulocytes: 0.03 10*3/uL (ref 0.00–0.07)
Basophils Absolute: 0 10*3/uL (ref 0.0–0.1)
Basophils Relative: 0 %
Eosinophils Absolute: 0.2 10*3/uL (ref 0.0–0.5)
Eosinophils Relative: 3 %
HCT: 39 % (ref 36.0–46.0)
Hemoglobin: 12.5 g/dL (ref 12.0–15.0)
Immature Granulocytes: 0 %
Lymphocytes Relative: 28 %
Lymphs Abs: 2.1 10*3/uL (ref 0.7–4.0)
MCH: 29.7 pg (ref 26.0–34.0)
MCHC: 32.1 g/dL (ref 30.0–36.0)
MCV: 92.6 fL (ref 80.0–100.0)
Monocytes Absolute: 0.6 10*3/uL (ref 0.1–1.0)
Monocytes Relative: 8 %
Neutro Abs: 4.3 10*3/uL (ref 1.7–7.7)
Neutrophils Relative %: 61 %
Platelets: 177 10*3/uL (ref 150–400)
RBC: 4.21 MIL/uL (ref 3.87–5.11)
RDW: 15.1 % (ref 11.5–15.5)
WBC: 7.3 10*3/uL (ref 4.0–10.5)
nRBC: 0 % (ref 0.0–0.2)

## 2021-02-11 LAB — LACTATE DEHYDROGENASE: LDH: 160 U/L (ref 98–192)

## 2021-02-11 MED ORDER — MELOXICAM 7.5 MG PO TABS: 7.5000 mg | ORAL_TABLET | Freq: Every day | ORAL | 1 refills | Status: DC | PRN

## 2021-02-11 NOTE — Progress Notes (Signed)
Reviewed and agree with assessment/plan.   Chesley Mires, MD [facility] Pulmonary/Critical Care 02/11/2021, 4:56 PM Pager:  [phone]

## 2021-02-11 NOTE — Telephone Encounter (Signed)
22morecall has been placed.  Patient is aware and voiced her understanding. Nothing further needed at this time.

## 2021-02-11 NOTE — Progress Notes (Signed)
START ON PATHWAY REGIMEN - Lymphoma and CLL     A cycle is every 28 days:     Rituximab-xxxx      Bendamustine   **Always confirm dose/schedule in your pharmacy ordering system**  Patient Characteristics: Mantle Cell Lymphoma, First Line, Aggressive Disease or Treatment Indicated, Stage II - IV, Not a Transplant Candidate Disease Type: Not Applicable Disease Type: Mantle Cell Lymphoma Disease Type: Not Applicable Line of Therapy: First Line Was TP53 Mutation Testing Completed<= No, TP53 Mutation Testing Not Completed Patient Characteristics: Not a Transplant Candidate Intent of Therapy: Non-Curative / Palliative Intent, Discussed with Patient 

## 2021-02-11 NOTE — Progress Notes (Signed)
Virtual Visit via Telephone Note  I connected with Michelle [name] on 02/11/21 at  4:00 PM EDT by telephone and verified that I am speaking with the correct person using two identifiers.  Location: Patient: Home Provider: Office    I discussed the limitations, risks, security and privacy concerns of performing an evaluation and management service by telephone and the availability of in person appointments. I also discussed with the patient that there may be a patient responsible charge related to this service. The patient expressed understanding and agreed to proceed.   History of Present Illness: 80 year old female, never smoked. PMH significant for heart failure, cardiomyopathy, GERD, RA, hyperlipidemia, obesity. Patient referred to [facility] pulmonary for sleep consult d/t snoring.   Previous [facility] pulmonary encounter: 09/11/2020 Patient presents today for sleep consult. She has hx sleep apnea. She was previously on CPAP, no longer wearing. She reports symptoms of daytime sleepiness, loud snoring and waking up gasping for air over the last year. Typical bedtime 8-10pm. She wakes up on average 4-5 times at night. She gets out of bed around 5:30am. She has some shortness of breath on exertion and an occasional nocturnal cough. She has albuterol inahler to use as needed which does help. Pollen exacerbates her cough. CT chest in November 2021 showed stable mild pulmonary fibrosis, evidence pulmonary hypertension. Denies chest tightness or wheezing.   Sleep questionnaire: Symptoms- Loud snoring, daytime fatigue, waking up gasping for air Prior sleep study- Not available Bedtime- 8-10pm Time to fall asleep - < 5 mins Nocturnal awakenings- 5 times Out of bed- 5:30am [place]-12/24   02/11/2021- Interim hx  Patient presents today to review sleep study results. She has symptoms snoring, frequent nocturnal awakenings, gasping for air and daytime sleepiness. HST on 11/29/20 showed mild OSA, AHI 9.6/hr  with SpO2 low 86%. We discussed treatment options including weight loss, oral appliance, CPAP therapy or referral to ENT for possible surgical interventions. She would not likely be the right candidate for oral appliance. Recommend starting CPAP therapy.     Observations/Objective:  - Able to speak in full sentences; no overt shortness of breath or wheezing   Assessment and Plan:  Mild OSA: - Patient has symptoms of snoring, apnea and daytime sleepiness - HST 11/29/20 showed mile OSA, AHI 9.6/hr  - Discussed treatment options including weight loss, oral appliance, CPAP therapy or referral to ENT for possible surgical intervention. She would likely not be a good candidate for oral appliance as she may be getting dentures down the road. She has decided to proceed with CPAP therapy.  - We will place an order for new CPAP start, auto titrate 5-15cm h20  - Encourage she continue to work on weight loss efforts. Advised against driving if experiencing excessive daytime sleepiness/fatigue    Follow Up Instructions:  - 3 months with APP in [place]    I discussed the assessment and treatment plan with the patient. The patient was provided an opportunity to ask questions and all were answered. The patient agreed with the plan and demonstrated an understanding of the instructions.   The patient was advised to call back or seek an in-person evaluation if the symptoms worsen or if the condition fails to improve as anticipated.  I provided 22 minutes of non-face-to-face time during this encounter.   Martyn Ehrich, NP

## 2021-02-11 NOTE — Assessment & Plan Note (Addendum)
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  Clinically STABLE.   # Currently on Calequence 100 mg twice a day x AUG 1st.  Patient tolerating it well except for mild headaches improved with caffeinated drinks.  Would recommend addition of rituximab weekly x4 and then-months.  Patient poor candidate for systemic chemotherapy because of extremely symptomatic neutropenia/infections.  # Left shoulder pain/ Chronic arthritis-NSAIDs as needed- STABLE.   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  STABLE.  # Pulmonary hypertension/chronic bilateral lung scarring- [s/p pulmonary evaluation]-improved on albuterol-STABLE.   # DISPOSITION: # follow up-MD in 2 weeks;  MD ;labs- cbc/bmp/ldh; HBsAg/HepC antibody [ordered]-Rituxan IV.Dr.B

## 2021-02-11 NOTE — Progress Notes (Signed)
[facility] OFFICE PROGRESS NOTE  Patient Care Team: Burnard Hawthorne, FNP as PCP - General (Family Medicine) End, Harrell Gave, MD as PCP - Cardiology (Cardiology) Deboraha Sprang, MD as PCP - Electrophysiology (Cardiology) Jackolyn Confer, MD (Internal Medicine) Cammie Sickle, MD as Consulting Physician (Internal Medicine)  Cancer Staging No matching staging information was found for the patient.   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 Feb 9th- START Benda-Ritux with neulasta; Prolonged neutropenia; DISCONT- Benda-Ritux;   # April 13 th 2017- START R-CHOP x1; severe/prolonged neutropenia; PET- CR; BMBx-Neg; Disc R-CHOP  # 26th MAY 2017- Start Rituxan q 68M Main OCT 12th 2017- PET NED.  Stop Rituxan maintenance x 2 years [aug 2019-pneumonia]  # June 2022- DIAGNOSIS: thickened cortex of 12 mm. An additional lymph node demonstrates a thickened cortex of 7 mm. A third lymph node demonstrates a mildly thickened cortex of 4 mm A. LYMPH NODE, LEFT AXILLA; ULTRASOUND-GUIDED BIOPSY:  - CD5+ MONOCLONAL B-CELL POPULATION; COMPATIBLE WITH INVOLVEMENT BY THE  PATIENT'S KNOWN MANTLE CELL LYMPHOMA.   #July 2022-second week-started ibrutinib 420 mg a dayx 1 week-stop because of severe rash.  Significant clinical response noted.  # AUG 1st, 2022- start acalbrutinib.   # Rheumatoid Arthritis [on MXT]; March 2017-MUGA scan-51 % --------------------------------------------------------    DIAGNOSIS: [jan 2017 ] Mantle cell lymphoma  STAGE: 4        ;GOALS: Control  CURRENT/MOST RECENT THERAPY: Surveillaince     Mantle cell lymphoma of lymph nodes of head, face, and neck ([place])  02/22/2021 -  Chemotherapy    Patient is on Treatment Plan: RITUXIMAB Q 4 W           INTERVAL  HISTORY:  Michelle [name] 80 y.o.  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  Patient admits to intermittent headaches-for which she has been taking caffeinated drinks seems to be helping.  Otherwise no rash.  No nausea no vomiting.  No fever no chills.  No chest pain or shortness of breath or cough.  Complains of mild right shoulder pain not any worse.   Review of Systems  Constitutional:  Negative for chills, diaphoresis, fever, malaise/fatigue and weight loss.  HENT:  Negative for nosebleeds and sore throat.   Eyes:  Negative for double vision.  Respiratory:  Negative for hemoptysis, sputum production and wheezing.   Cardiovascular:  Negative for chest pain, palpitations, orthopnea and leg swelling.  Gastrointestinal:  Negative for abdominal pain, blood in stool, constipation, diarrhea, heartburn, melena, nausea and vomiting.  Genitourinary:  Negative for dysuria, frequency and urgency.  Musculoskeletal:  Positive for back pain and joint pain.  Skin: Negative.  Negative for itching and rash.  Neurological:  Negative for dizziness, tingling, focal weakness, weakness and headaches.  Endo/Heme/Allergies:  Does not bruise/bleed easily.  Psychiatric/Behavioral:  Negative for depression. The patient is not nervous/anxious and does not have insomnia.    PAST MEDICAL HISTORY :  Past Medical History:  Diagnosis Date   Arthritis    Collagen vascular disease ([place])    GERD (gastroesophageal reflux disease)    Headache(784.0)    History of methotrexate therapy    Hyperlipidemia    hx   Lymphadenopathy of head and neck 01/2015   see on Thyroid ultrasound   Lymphoma,  mantle cell ([place]) 06/01/2015   bx of lymph node in right breast/Stage IV Mantle Cell Lymphoma   Personal history of chemotherapy    Rheumatoid arthritis ([place])     PAST SURGICAL HISTORY :   Past Surgical History:  Procedure Laterality Date   BREAST BIOPSY  Left 11/07/2020   u/s bx-hydromark #3 "coil"-path pending   CARDIAC CATHETERIZATION  09/2004   [facility]; EF 60%   CARDIAC CATHETERIZATION  08/2004   [facility]   IR FLUORO GUIDED NEEDLE PLC ASPIRATION/INJECTION LOC  06/18/2018   PERIPHERAL VASCULAR CATHETERIZATION N/A 07/04/2015   Procedure: [name] Insertion;  Surgeon: [name], MD;  Location: [facility];  Service: Cardiovascular;  Laterality: N/A;   PORTA CATH REMOVAL N/A 06/23/2018   Procedure: PORTA CATH REMOVAL;  Surgeon: [name], MD;  Location: [facility];  Service: Cardiovascular;  Laterality: N/A;   RIGHT/LEFT HEART CATH AND CORONARY ANGIOGRAPHY Bilateral 09/20/2019   Procedure: RIGHT/LEFT HEART CATH AND CORONARY ANGIOGRAPHY;  Surgeon: Nelva Bush, MD;  Location: [facility];  Service: Cardiovascular;  Laterality: Bilateral;    FAMILY HISTORY :   Family History  Problem Relation Age of Onset   Diabetes Mother    Cholelithiasis Mother    Hypertension Sister    Diabetes Sister    Heart murmur Sister    Arthritis Brother    Breast cancer Neg Hx     SOCIAL HISTORY:   Social History   Tobacco Use   Smoking status: Never   Smokeless tobacco: Never  Vaping Use   Vaping Use: Never used  Substance Use Topics   Alcohol use: No   Drug use: No    ALLERGIES:  has No Known Allergies.  MEDICATIONS:  Current Outpatient Medications  Medication Sig Dispense Refill   acetaminophen (TYLENOL) 500 MG tablet Take 1,000 mg by mouth every 6 (six) hours as needed for mild pain.      albuterol (VENTOLIN HFA) 108 (90 Base) MCG/ACT inhaler TAKE 2 PUFFS BY MOUTH EVERY 6 HOURS AS NEEDED FOR WHEEZE OR SHORTNESS OF BREATH 18 each 1   cyanocobalamin 2000 MCG tablet Take 2,500 mcg by mouth daily.      fluticasone (FLONASE) 50 MCG/ACT nasal spray Place 2 sprays into both nostrils daily as needed for allergies. 16 g 4   furosemide (LASIX) 40 MG tablet TAKE 1 TABLET BY MOUTH EVERY DAY 90 tablet 0   MEGARED OMEGA-3  KRILL OIL PO Take 1 tablet by mouth daily.     Multiple Vitamins-Minerals (CENTRUM SILVER 50+WOMEN) TABS Take by mouth.     Multiple Vitamins-Minerals (HAIR SKIN AND NAILS FORMULA PO) Take by mouth.     rosuvastatin (CRESTOR) 5 MG tablet Take 1 tablet (5 mg total) by mouth daily. 90 tablet 3   spironolactone (ALDACTONE) 25 MG tablet Take 0.5 tablets (12.5 mg total) by mouth daily. 45 tablet 3   carvedilol (COREG) 3.125 MG tablet TAKE 1 TABLET BY MOUTH TWICE A DAY 180 tablet 0   losartan (COZAAR) 25 MG tablet TAKE 1 TABLET BY MOUTH EVERY DAY 90 tablet 1   meloxicam (MOBIC) 7.5 MG tablet Take 1 tablet (7.5 mg total) by mouth daily as needed for pain. 30 tablet 1   No current facility-administered medications for this visit.   Facility-Administered Medications Ordered in Other Visits  Medication Dose Route Frequency Provider Last Rate Last Admin   heparin lock flush 100 unit/mL  500 Units Intravenous Once Cammie Sickle, MD  sodium chloride flush (NS) 0.9 % injection 10 mL  10 mL Intravenous Once Charlaine Dalton R, MD       sodium chloride flush (NS) 0.9 % injection 10 mL  10 mL Intravenous Once Cammie Sickle, MD       Tbo-Filgrastim [facility]) injection 480 mcg  480 mcg Subcutaneous Once Cammie Sickle, MD        PHYSICAL EXAMINATION: ECOG PERFORMANCE STATUS: 0 - Asymptomatic  BP 109/69 (BP Location: Left Arm, Patient Position: Sitting)   Pulse 68   Temp 97.7 F (36.5 C) (Tympanic)   Resp 18   Wt 253 lb (114.8 kg)   BMI 39.62 kg/m   Filed Weights   02/11/21 1503  Weight: 253 lb (114.8 kg)    Physical Exam Constitutional:      Comments: Obese.  Walking herself with cane.  Accompanied by her sister.  HENT:     Head: Normocephalic and atraumatic.     Mouth/Throat:     Pharynx: No oropharyngeal exudate.  Eyes:     Pupils: Pupils are equal, round, and reactive to light.  Cardiovascular:     Rate and Rhythm: Normal rate and regular rhythm.   Pulmonary:     Effort: No respiratory distress.     Breath sounds: No wheezing.  Abdominal:     General: Bowel sounds are normal. There is no distension.     Palpations: Abdomen is soft. There is no mass.     Tenderness: There is no abdominal tenderness. There is no guarding or rebound.  Musculoskeletal:        General: No tenderness. Normal range of motion.     Cervical back: Normal range of motion and neck supple.  Skin:    General: Skin is warm.     Comments: Resolution of the 4 to 5 cm soft mass noted on the left posterior shoulder.  And also resolution of the left axillary lymphadenopathy.  Neurological:     Mental Status: She is alert and oriented to person, place, and time.  Psychiatric:        Mood and Affect: Affect normal.   LABORATORY DATA:  I have reviewed the data as listed    Component Value Date/Time   NA 140 02/22/2021 0802   NA 145 (H) 09/08/2019 1359   NA 142 09/14/2013 1127   K 3.9 02/22/2021 0802   K 3.5 09/14/2013 1127   CL 104 02/22/2021 0802   CL 110 (H) 09/14/2013 1127   CO2 28 02/22/2021 0802   CO2 29 09/14/2013 1127   GLUCOSE 98 02/22/2021 0802   GLUCOSE 106 (H) 09/14/2013 1127   BUN 19 02/22/2021 0802   BUN 15 09/08/2019 1359   BUN 8 09/14/2013 1127   CREATININE 0.78 02/22/2021 0802   CREATININE 0.71 09/14/2013 1127   CREATININE 0.68 04/01/2013 1550   CALCIUM 8.8 (L) 02/22/2021 0802   CALCIUM 8.6 09/14/2013 1127   PROT 6.5 02/22/2021 0802   PROT 7.6 09/14/2013 1127   ALBUMIN 3.7 02/22/2021 0802   ALBUMIN 3.2 (L) 09/14/2013 1127   AST 21 02/22/2021 0802   AST 22 09/14/2013 1127   ALT 12 02/22/2021 0802   ALT 19 09/14/2013 1127   ALKPHOS 67 02/22/2021 0802   ALKPHOS 76 09/14/2013 1127   BILITOT 0.6 02/22/2021 0802   BILITOT 0.4 09/14/2013 1127   GFRNONAA >60 02/22/2021 0802   GFRNONAA >60 09/14/2013 1127   GFRAA >60 01/20/2020 1303   GFRAA >60 09/14/2013 1127  No results found for: SPEP, UPEP  Lab Results  Component Value Date    WBC 7.3 02/11/2021   NEUTROABS 4.3 02/11/2021   HGB 12.5 02/11/2021   HCT 39.0 02/11/2021   MCV 92.6 02/11/2021   PLT 177 02/11/2021      Chemistry      Component Value Date/Time   NA 140 02/22/2021 0802   NA 145 (H) 09/08/2019 1359   NA 142 09/14/2013 1127   K 3.9 02/22/2021 0802   K 3.5 09/14/2013 1127   CL 104 02/22/2021 0802   CL 110 (H) 09/14/2013 1127   CO2 28 02/22/2021 0802   CO2 29 09/14/2013 1127   BUN 19 02/22/2021 0802   BUN 15 09/08/2019 1359   BUN 8 09/14/2013 1127   CREATININE 0.78 02/22/2021 0802   CREATININE 0.71 09/14/2013 1127   CREATININE 0.68 04/01/2013 1550      Component Value Date/Time   CALCIUM 8.8 (L) 02/22/2021 0802   CALCIUM 8.6 09/14/2013 1127   ALKPHOS 67 02/22/2021 0802   ALKPHOS 76 09/14/2013 1127   AST 21 02/22/2021 0802   AST 22 09/14/2013 1127   ALT 12 02/22/2021 0802   ALT 19 09/14/2013 1127   BILITOT 0.6 02/22/2021 0802   BILITOT 0.4 09/14/2013 1127       RADIOGRAPHIC STUDIES: I have personally reviewed the radiological images as listed and agreed with the findings in the report. No results found.   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  Clinically STABLE.   # Currently on Calequence 100 mg twice a day x AUG 1st.  Patient tolerating it well except for mild headaches improved with caffeinated drinks.  Would recommend addition of rituximab weekly x4 and then-months.  Patient poor candidate for systemic chemotherapy because of extremely symptomatic neutropenia/infections.  # Left shoulder pain/ Chronic arthritis-NSAIDs as needed- STABLE.   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  STABLE.  # Pulmonary hypertension/chronic bilateral lung scarring- [s/p pulmonary evaluation]-improved on albuterol-STABLE.   # DISPOSITION: # follow up-MD in 2 weeks;  MD ;labs- cbc/bmp/ldh; HBsAg/HepC antibody [ordered]-Rituxan IV.Dr.B        Orders Placed This Encounter  Procedures   Hepatitis B surface antigen    Standing Status:   Future    Number of Occurrences:   1    Standing Expiration Date:   02/11/2022   Hepatitis B core antibody, IgM    Standing Status:   Future    Number of Occurrences:   1    Standing Expiration Date:   02/11/2022   CBC with Differential/Platelet    Standing Status:   Future    Number of Occurrences:   1    Standing Expiration Date:   02/11/2022   Comprehensive metabolic panel    Standing Status:   Future    Number of Occurrences:   1    Standing Expiration Date:   02/11/2022   Lactate dehydrogenase    Standing Status:   Future    Number of Occurrences:   1    Standing Expiration Date:   02/11/2022   Hepatitis C antibody    Standing Status:  Future    Number of Occurrences:   1    Standing Expiration Date:   02/11/2022    All questions were answered. The patient knows to call the clinic with any problems, questions or concerns.      Cammie Sickle, MD 02/22/2021 8:44 AM

## 2021-02-11 NOTE — Patient Instructions (Addendum)
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   We will place an order for new CPAP start, auto titrate 5-15cm h20. Aim to wear every night for 4-6 hours or longer   Work on weight loss efforts. Advised against driving if experiencing excessive daytime sleepiness/fatigue   Follow-up 3 months with Beth in [place]    CPAP and BPAP Information CPAP and BPAP (also called BiPAP) are methods that use air pressure to keep your airways open and to help you breathe well. CPAP and BPAP use different amounts of pressure. Your health care provider will tell you whether CPAP or BPAP would be more helpful for you. CPAP stands for "continuous positive airway pressure." With CPAP, the amount of pressure stays the same while you breathe in (inhale) and out (exhale). BPAP stands for "bi-level positive airway pressure." With BPAP, the amount of pressure will be higher when you inhale and lower when you exhale. This allows you to take larger breaths. CPAP or BPAP may be used in the hospital, or your health care provider may want you to use it at home. You may need to have a sleep study before your health care provider can order a machine for you to use at home. What are the advantages? CPAP or BPAP can be helpful if you have: Sleep apnea. Chronic obstructive pulmonary disease (COPD). Heart failure. Medical conditions that cause muscle weakness, including muscular dystrophy or amyotrophic lateral sclerosis (ALS). Other problems that cause breathing to be shallow, weak, abnormal, or difficult. CPAP and BPAP are most commonly used for obstructive sleep apnea (OSA) to keep the airways from collapsing when the muscles relax during sleep. What are the risks? Generally, this is a safe treatment. However, problems may occur, including: Irritated skin or skin sores if the mask does not fit properly. Dry  or stuffy nose or nosebleeds. Dry mouth. Feeling gassy or bloated. Sinus or lung infection if the equipment is not cleaned properly. When should CPAP or BPAP be used? In most cases, the mask only needs to be worn during sleep. Generally, the mask needs to be worn throughout the night and during any daytime naps. People with certain medical conditions may also need to wear the mask at other times, such as when they are awake. Follow instructions from your health care provider about when to use the machine. What happens during CPAP or BPAP? Both CPAP and BPAP are provided by a small machine with a flexible plastic tube that attaches to a plastic mask that you wear. Air is blown through the mask into your nose or mouth. The amount of pressure that is used to blow the air can be adjusted on the machine. Your health care provider will set the pressure setting and help you find the best mask for you. Tips for using the mask Because the mask needs to be snug, some people feel trapped or closed-in (claustrophobic) when first using the mask. If you feel this way, you may need to get used to the mask. One way to do this is to hold the mask loosely over your nose or mouth and then gradually apply the mask more snugly. You can also gradually increase the amount of time that you use the mask. Masks are available in various types and sizes. If your mask does not fit well, talk with your health care provider about getting a different one. Some common types  of masks include: Full face masks, which fit over the mouth and nose. Nasal masks, which fit over the nose. Nasal pillow or prong masks, which fit into the nostrils. If you are using a mask that fits over your nose and you tend to breathe through your mouth, a chin strap may be applied to help keep your mouth closed. Use a skin barrier to protect your skin as told by your health care provider. Some CPAP and BPAP machines have alarms that may sound if the mask  comes off or develops a leak. If you have trouble with the mask, it is very important that you talk with your health care provider about finding a way to make the mask easier to tolerate. Do not stop using the mask. There could be a negative impact on your health if you stop using the mask. Tips for using the machine Place your CPAP or BPAP machine on a secure table or stand near an electrical outlet. Know where the on/off switch is on the machine. Follow instructions from your health care provider about how to set the pressure on your machine and when you should use it. Do not eat or drink while the CPAP or BPAP machine is on. Food or fluids could get pushed into your lungs by the pressure of the CPAP or BPAP. For home use, CPAP and BPAP machines can be rented or purchased through home health care companies. Many different brands of machines are available. Renting a machine before purchasing may help you find out which particular machine works well for you. Your health insurance company may also decide which machine you may get. Keep the CPAP or BPAP machine and attachments clean. Ask your health care provider for specific instructions. Check the humidifier if you have a dry stuffy nose or nosebleeds. Make sure it is working correctly. Follow these instructions at home: Take over-the-counter and prescription medicines only as told by your health care provider. Ask if you can take sinus medicine if your sinuses are blocked. Do not use any products that contain nicotine or tobacco. These products include cigarettes, chewing tobacco, and vaping devices, such as e-cigarettes. If you need help quitting, ask your health care provider. Keep all follow-up visits. This is important. Contact a health care provider if: You have redness or pressure sores on your head, face, mouth, or nose from the mask or head gear. You have trouble using the CPAP or BPAP machine. You cannot tolerate wearing the CPAP or BPAP  mask. Someone tells you that you snore even when wearing your CPAP or BPAP. Get help right away if: You have trouble breathing. You feel confused. Summary CPAP and BPAP are methods that use air pressure to keep your airways open and to help you breathe well. If you have trouble with the mask, it is very important that you talk with your health care provider about finding a way to make the mask easier to tolerate. Do not stop using the mask. There could be a negative impact to your health if you stop using the mask. Follow instructions from your health care provider about when to use the machine. This information is not intended to replace advice given to you by your health care provider. Make sure you discuss any questions you have with your health care provider. Document Revised: 04/27/2020 Document Reviewed: 04/27/2020 Elsevier Patient Education  2022 [employer].

## 2021-02-11 NOTE — Telephone Encounter (Signed)
Can you set patient up for 3-4 month follow-up with me in [facility]

## 2021-02-15 NOTE — Progress Notes (Signed)
Pharmacist Chemotherapy Monitoring - Initial Assessment    Anticipated start date: 02/22/21   The following has been reviewed per standard work regarding the patient's treatment regimen: The patient's diagnosis, treatment plan and drug doses, and organ/hematologic function Lab orders and baseline tests specific to treatment regimen  The treatment plan start date, drug sequencing, and pre-medications Prior authorization status  Patient's documented medication list, including drug-drug interaction screen and prescriptions for anti-emetics and supportive care specific to the treatment regimen The drug concentrations, fluid compatibility, administration routes, and timing of the medications to be used The patient's access for treatment and lifetime cumulative dose history, if applicable  The patient's medication allergies and previous infusion related reactions, if applicable   Changes made to treatment plan:  treatment plan date  Follow up needed:  signing treatment plan   Adelina Mings, [place], 02/15/2021  10:58 AM

## 2021-02-19 ENCOUNTER — Other Ambulatory Visit: Payer: Self-pay | Admitting: Physician Assistant

## 2021-02-19 ENCOUNTER — Other Ambulatory Visit (HOSPITAL_COMMUNITY): Payer: Self-pay

## 2021-02-20 ENCOUNTER — Other Ambulatory Visit (HOSPITAL_COMMUNITY): Payer: Self-pay

## 2021-02-21 ENCOUNTER — Other Ambulatory Visit: Payer: Self-pay | Admitting: Physician Assistant

## 2021-02-22 ENCOUNTER — Other Ambulatory Visit: Payer: Self-pay

## 2021-02-22 ENCOUNTER — Encounter: Payer: Self-pay | Admitting: Internal Medicine

## 2021-02-22 ENCOUNTER — Inpatient Hospital Stay: Payer: Medicare HMO | Admitting: Internal Medicine

## 2021-02-22 ENCOUNTER — Inpatient Hospital Stay: Payer: Medicare HMO

## 2021-02-22 VITALS — BP 117/68 | HR 66 | Temp 96.7°F | Resp 18

## 2021-02-22 DIAGNOSIS — C8311 Mantle cell lymphoma, lymph nodes of head, face, and neck: Secondary | ICD-10-CM | POA: Diagnosis not present

## 2021-02-22 DIAGNOSIS — Z5112 Encounter for antineoplastic immunotherapy: Secondary | ICD-10-CM | POA: Diagnosis not present

## 2021-02-22 LAB — CBC WITH DIFFERENTIAL/PLATELET
Abs Immature Granulocytes: 0.03 10*3/uL (ref 0.00–0.07)
Basophils Absolute: 0 10*3/uL (ref 0.0–0.1)
Basophils Relative: 0 %
Eosinophils Absolute: 0.3 10*3/uL (ref 0.0–0.5)
Eosinophils Relative: 4 %
HCT: 40.7 % (ref 36.0–46.0)
Hemoglobin: 13.2 g/dL (ref 12.0–15.0)
Immature Granulocytes: 0 %
Lymphocytes Relative: 24 %
Lymphs Abs: 1.7 10*3/uL (ref 0.7–4.0)
MCH: 29.9 pg (ref 26.0–34.0)
MCHC: 32.4 g/dL (ref 30.0–36.0)
MCV: 92.3 fL (ref 80.0–100.0)
Monocytes Absolute: 0.5 10*3/uL (ref 0.1–1.0)
Monocytes Relative: 7 %
Neutro Abs: 4.7 10*3/uL (ref 1.7–7.7)
Neutrophils Relative %: 65 %
Platelets: 206 10*3/uL (ref 150–400)
RBC: 4.41 MIL/uL (ref 3.87–5.11)
RDW: 15 % (ref 11.5–15.5)
WBC: 7.2 10*3/uL (ref 4.0–10.5)
nRBC: 0 % (ref 0.0–0.2)

## 2021-02-22 LAB — COMPREHENSIVE METABOLIC PANEL
ALT: 12 U/L (ref 0–44)
AST: 21 U/L (ref 15–41)
Albumin: 3.7 g/dL (ref 3.5–5.0)
Alkaline Phosphatase: 67 U/L (ref 38–126)
Anion gap: 8 (ref 5–15)
BUN: 19 mg/dL (ref 8–23)
CO2: 28 mmol/L (ref 22–32)
Calcium: 8.8 mg/dL — ABNORMAL LOW (ref 8.9–10.3)
Chloride: 104 mmol/L (ref 98–111)
Creatinine, Ser: 0.78 mg/dL (ref 0.44–1.00)
GFR, Estimated: >60 mL/min (ref >60)
Glucose, Bld: 98 mg/dL (ref 70–99)
Potassium: 3.9 mmol/L (ref 3.5–5.1)
Sodium: 140 mmol/L (ref 135–145)
Total Bilirubin: 0.6 mg/dL (ref 0.3–1.2)
Total Protein: 6.5 g/dL (ref 6.5–8.1)

## 2021-02-22 LAB — HEPATITIS C ANTIBODY: HCV Ab: NONREACTIVE

## 2021-02-22 LAB — HEPATITIS B SURFACE ANTIGEN: Hepatitis B Surface Ag: NONREACTIVE

## 2021-02-22 LAB — LACTATE DEHYDROGENASE: LDH: 176 U/L (ref 98–192)

## 2021-02-22 LAB — HEPATITIS B CORE ANTIBODY, IGM: Hep B C IgM: NONREACTIVE

## 2021-02-22 MED ORDER — DIPHENHYDRAMINE HCL 25 MG PO CAPS
50.0000 mg | ORAL_CAPSULE | Freq: Once | ORAL | Status: AC
  Administered 2021-02-22: 50 mg via ORAL
  Filled 2021-02-22: qty 2

## 2021-02-22 MED ORDER — DEXAMETHASONE SODIUM PHOSPHATE 10 MG/ML IJ SOLN: 20.0000 mg | Freq: Once | INTRAMUSCULAR | Status: DC

## 2021-02-22 MED ORDER — SODIUM CHLORIDE 0.9 % IV SOLN
20.0000 mg | Freq: Once | INTRAVENOUS | Status: AC
  Administered 2021-02-22: 20 mg via INTRAVENOUS
  Filled 2021-02-22: qty 20

## 2021-02-22 MED ORDER — SODIUM CHLORIDE 0.9 % IV SOLN
375.0000 mg/m2 | Freq: Once | INTRAVENOUS | Status: AC
  Administered 2021-02-22: 900 mg via INTRAVENOUS
  Filled 2021-02-22: qty 50

## 2021-02-22 MED ORDER — ACETAMINOPHEN 325 MG PO TABS
650.0000 mg | ORAL_TABLET | Freq: Once | ORAL | Status: AC
  Administered 2021-02-22: 650 mg via ORAL
  Filled 2021-02-22: qty 2

## 2021-02-22 MED ORDER — SODIUM CHLORIDE 0.9 % IV SOLN
Freq: Once | INTRAVENOUS | Status: AC
  Filled 2021-02-22: qty 250

## 2021-02-22 NOTE — Patient Instructions (Signed)
[facility]   Discharge Instructions: Thank you for choosing [facility] to provide your oncology and hematology care.  If you have a lab appointment with the [facility], please go directly to the [facility] and check in at the registration area.   We strive to give you quality time with your provider. You may need to reschedule your appointment if you arrive late (15 or more minutes).  Arriving late affects you and other patients whose appointments are after yours.  Also, if you miss three or more appointments without notifying the office, you may be dismissed from the clinic at the provider's discretion.      For prescription refill requests, have your pharmacy contact our office and allow 72 hours for refills to be completed.    Today you received the following chemotherapy and/or immunotherapy agents: Ruxience.      To help prevent nausea and vomiting after your treatment, we encourage you to take your nausea medication as directed.  BELOW ARE SYMPTOMS THAT SHOULD BE REPORTED IMMEDIATELY: *FEVER GREATER THAN 100.4 F (38 C) OR HIGHER *CHILLS OR SWEATING *NAUSEA AND VOMITING THAT IS NOT CONTROLLED WITH YOUR NAUSEA MEDICATION *UNUSUAL SHORTNESS OF BREATH *UNUSUAL BRUISING OR BLEEDING *URINARY PROBLEMS (pain or burning when urinating, or frequent urination) *BOWEL PROBLEMS (unusual diarrhea, constipation, pain near the anus) TENDERNESS IN MOUTH AND THROAT WITH OR WITHOUT PRESENCE OF ULCERS (sore throat, sores in mouth, or a toothache) UNUSUAL RASH, SWELLING OR PAIN  UNUSUAL VAGINAL DISCHARGE OR ITCHING   Items with * indicate a potential emergency and should be followed up as soon as possible or go to the Emergency Department if any problems should occur.  Please show the CHEMOTHERAPY ALERT CARD or IMMUNOTHERAPY ALERT CARD at check-in to the Emergency Department and triage nurse.  Should you have questions after your visit or need  to cancel or reschedule your appointment, please contact [facility]  [phone] and follow the prompts.  Office hours are 8:00 a.m. to 4:30 p.m. Monday - Friday. Please note that voicemails left after 4:00 p.m. may not be returned until the following business day.  We are closed weekends and major holidays. You have access to a nurse at all times for urgent questions. Please call the main number to the clinic [phone] and follow the prompts.  For any non-urgent questions, you may also contact your provider using MyChart. We now offer e-Visits for anyone 74 and older to request care online for non-urgent symptoms. For details visit [email or web address].   Also download the MyChart app! Go to the app store, search "MyChart", open the app, select [facility], and log in with your MyChart username and password.  Due to Covid, a mask is required upon entering the hospital/clinic. If you do not have a mask, one will be given to you upon arrival. For doctor visits, patients may have 1 support person aged 24 or older with them. For treatment visits, patients cannot have anyone with them due to current Covid guidelines and our immunocompromised population.

## 2021-02-22 NOTE — Progress Notes (Signed)
[facility] OFFICE PROGRESS NOTE  Patient Care Team: Burnard Hawthorne, FNP as PCP - General (Family Medicine) End, Harrell Gave, MD as PCP - Cardiology (Cardiology) Deboraha Sprang, MD as PCP - Electrophysiology (Cardiology) Jackolyn Confer, MD (Internal Medicine) Cammie Sickle, MD as Consulting Physician (Internal Medicine)  Cancer Staging No matching staging information was found for the patient.   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 Feb 9th- START Benda-Ritux with neulasta; Prolonged neutropenia; DISCONT- Benda-Ritux;   # April 13 th 2017- START R-CHOP x1; severe/prolonged neutropenia; PET- CR; BMBx-Neg; Disc R-CHOP  # 26th MAY 2017- Start Rituxan q 39M Main OCT 12th 2017- PET NED.  Stop Rituxan maintenance x 2 years [aug 2019-pneumonia]  # June 2022- DIAGNOSIS: thickened cortex of 12 mm. An additional lymph node demonstrates a thickened cortex of 7 mm. A third lymph node demonstrates a mildly thickened cortex of 4 mm A. LYMPH NODE, LEFT AXILLA; ULTRASOUND-GUIDED BIOPSY:  - CD5+ MONOCLONAL B-CELL POPULATION; COMPATIBLE WITH INVOLVEMENT BY THE  PATIENT'S KNOWN MANTLE CELL LYMPHOMA.   #July 2022-second week-started ibrutinib 420 mg a dayx 1 week-stop because of severe rash.  Significant clinical response noted.  # AUG 1st, 2022- start acalbrutinib.   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   Michelle [name] 80 y.o.  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   Admits to compliance.  Otherwise no rash.  No nausea no vomiting.  No fever no chills.  No chest pain or shortness of breath or cough.  Complains of mild right shoulder pain not any worse.   Review of Systems  Constitutional:  Negative for chills, diaphoresis, fever, malaise/fatigue and weight loss.  HENT:  Negative for nosebleeds and sore throat.   Eyes:  Negative for double vision.  Respiratory:  Negative for hemoptysis, sputum production and wheezing.   Cardiovascular:  Negative for chest pain, palpitations, orthopnea and leg swelling.  Gastrointestinal:  Negative for abdominal pain, blood in stool, constipation, diarrhea, heartburn, melena, nausea and vomiting.  Genitourinary:  Negative for dysuria, frequency and urgency.  Musculoskeletal:  Positive for back pain and joint pain.  Skin: Negative.  Negative for itching and rash.  Neurological:  Negative for dizziness, tingling, focal weakness, weakness and headaches.  Endo/Heme/Allergies:  Does not bruise/bleed easily.  Psychiatric/Behavioral:  Negative for depression. The patient is not nervous/anxious and does not have insomnia.    PAST MEDICAL HISTORY :  Past Medical History:  Diagnosis Date   Arthritis    Collagen vascular disease ([place])    GERD (gastroesophageal reflux disease)    Headache(784.0)    History of methotrexate therapy    Hyperlipidemia    hx   Lymphadenopathy of head and neck 01/2015   see on Thyroid ultrasound   Lymphoma, mantle cell ([place]) 06/01/2015  bx of lymph node in right breast/Stage IV Mantle Cell Lymphoma   Personal history of chemotherapy    Rheumatoid arthritis ([place])     PAST SURGICAL HISTORY :   Past Surgical History:  Procedure Laterality Date   BREAST BIOPSY Left 11/07/2020   u/s  bx-hydromark #3 "coil"-path pending   CARDIAC CATHETERIZATION  09/2004   [facility]; EF 60%   CARDIAC CATHETERIZATION  08/2004   [facility]   IR FLUORO GUIDED NEEDLE PLC ASPIRATION/INJECTION LOC  06/18/2018   PERIPHERAL VASCULAR CATHETERIZATION N/A 07/04/2015   Procedure: [name] Insertion;  Surgeon: [name], MD;  Location: [facility];  Service: Cardiovascular;  Laterality: N/A;   PORTA CATH REMOVAL N/A 06/23/2018   Procedure: PORTA CATH REMOVAL;  Surgeon: [name], MD;  Location: [facility];  Service: Cardiovascular;  Laterality: N/A;   RIGHT/LEFT HEART CATH AND CORONARY ANGIOGRAPHY Bilateral 09/20/2019   Procedure: RIGHT/LEFT HEART CATH AND CORONARY ANGIOGRAPHY;  Surgeon: Nelva Bush, MD;  Location: [facility];  Service: Cardiovascular;  Laterality: Bilateral;    FAMILY HISTORY :   Family History  Problem Relation Age of Onset   Diabetes Mother    Cholelithiasis Mother    Hypertension Sister    Diabetes Sister    Heart murmur Sister    Arthritis Brother    Breast cancer Neg Hx     SOCIAL HISTORY:   Social History   Tobacco Use   Smoking status: Never   Smokeless tobacco: Never  Vaping Use   Vaping Use: Never used  Substance Use Topics   Alcohol use: No   Drug use: No    ALLERGIES:  has No Known Allergies.  MEDICATIONS:  Current Outpatient Medications  Medication Sig Dispense Refill   acetaminophen (TYLENOL) 500 MG tablet Take 1,000 mg by mouth every 6 (six) hours as needed for mild pain.      albuterol (VENTOLIN HFA) 108 (90 Base) MCG/ACT inhaler TAKE 2 PUFFS BY MOUTH EVERY 6 HOURS AS NEEDED FOR WHEEZE OR SHORTNESS OF BREATH 18 each 1   carvedilol (COREG) 3.125 MG tablet TAKE 1 TABLET BY MOUTH TWICE A DAY 180 tablet 0   cyanocobalamin 2000 MCG tablet Take 2,500 mcg by mouth daily.      fluticasone (FLONASE) 50 MCG/ACT nasal spray Place 2 sprays into both nostrils daily as needed for allergies. 16 g 4   furosemide (LASIX) 40 MG tablet TAKE  1 TABLET BY MOUTH EVERY DAY 90 tablet 0   losartan (COZAAR) 25 MG tablet TAKE 1 TABLET BY MOUTH EVERY DAY 90 tablet 1   MEGARED OMEGA-3 KRILL OIL PO Take 1 tablet by mouth daily.     meloxicam (MOBIC) 7.5 MG tablet Take 1 tablet (7.5 mg total) by mouth daily as needed for pain. 30 tablet 1   Multiple Vitamins-Minerals (CENTRUM SILVER 50+WOMEN) TABS Take by mouth.     Multiple Vitamins-Minerals (HAIR SKIN AND NAILS FORMULA PO) Take by mouth.     rosuvastatin (CRESTOR) 5 MG tablet Take 1 tablet (5 mg total) by mouth daily. 90 tablet 3   spironolactone (ALDACTONE) 25 MG tablet Take 0.5 tablets (12.5 mg total) by mouth daily. 45 tablet 3   No current facility-administered medications for this visit.   Facility-Administered Medications Ordered in Other Visits  Medication Dose Route Frequency Provider Last Rate Last Admin   acetaminophen (TYLENOL) tablet 650 mg  650 mg Oral Once Cammie Sickle, MD       dexamethasone (  DECADRON) injection 20 mg  20 mg Intravenous Once Cammie Sickle, MD       diphenhydrAMINE (BENADRYL) capsule 50 mg  50 mg Oral Once Charlaine Dalton R, MD       heparin lock flush 100 unit/mL  500 Units Intravenous Once Cammie Sickle, MD       riTUXimab-pvvr (RUXIENCE) 900 mg in sodium chloride 0.9 % 250 mL (2.6471 mg/mL) infusion  375 mg/m2 (Treatment Plan Recorded) Intravenous Once Cammie Sickle, MD       sodium chloride flush (NS) 0.9 % injection 10 mL  10 mL Intravenous Once Charlaine Dalton R, MD       sodium chloride flush (NS) 0.9 % injection 10 mL  10 mL Intravenous Once Cammie Sickle, MD       Tbo-Filgrastim [facility]) injection 480 mcg  480 mcg Subcutaneous Once Cammie Sickle, MD        PHYSICAL EXAMINATION: ECOG PERFORMANCE STATUS: 0 - Asymptomatic  BP 119/74   Pulse 63   Temp 97.9 F (36.6 C) (Tympanic)   Resp 20   Ht 5' 7.01" (1.702 m)   Wt 253 lb (114.8 kg)   BMI 39.61 kg/m   Filed Weights   02/22/21 0832   Weight: 253 lb (114.8 kg)    Physical Exam Constitutional:      Comments: Obese.  Walking herself with cane.  Accompanied by her sister.  HENT:     Head: Normocephalic and atraumatic.     Mouth/Throat:     Pharynx: No oropharyngeal exudate.  Eyes:     Pupils: Pupils are equal, round, and reactive to light.  Cardiovascular:     Rate and Rhythm: Normal rate and regular rhythm.  Pulmonary:     Effort: No respiratory distress.     Breath sounds: No wheezing.  Abdominal:     General: Bowel sounds are normal. There is no distension.     Palpations: Abdomen is soft. There is no mass.     Tenderness: There is no abdominal tenderness. There is no guarding or rebound.  Musculoskeletal:        General: No tenderness. Normal range of motion.     Cervical back: Normal range of motion and neck supple.  Skin:    General: Skin is warm.     Comments: Resolution of the 4 to 5 cm soft mass noted on the left posterior shoulder.  And also resolution of the left axillary lymphadenopathy.  Neurological:     Mental Status: She is alert and oriented to person, place, and time.  Psychiatric:        Mood and Affect: Affect normal.   LABORATORY DATA:  I have reviewed the data as listed    Component Value Date/Time   NA 140 02/22/2021 0802   NA 145 (H) 09/08/2019 1359   NA 142 09/14/2013 1127   K 3.9 02/22/2021 0802   K 3.5 09/14/2013 1127   CL 104 02/22/2021 0802   CL 110 (H) 09/14/2013 1127   CO2 28 02/22/2021 0802   CO2 29 09/14/2013 1127   GLUCOSE 98 02/22/2021 0802   GLUCOSE 106 (H) 09/14/2013 1127   BUN 19 02/22/2021 0802   BUN 15 09/08/2019 1359   BUN 8 09/14/2013 1127   CREATININE 0.78 02/22/2021 0802   CREATININE 0.71 09/14/2013 1127   CREATININE 0.68 04/01/2013 1550   CALCIUM 8.8 (L) 02/22/2021 0802   CALCIUM 8.6 09/14/2013 1127   PROT 6.5 02/22/2021 0802  PROT 7.6 09/14/2013 1127   ALBUMIN 3.7 02/22/2021 0802   ALBUMIN 3.2 (L) 09/14/2013 1127   AST 21 02/22/2021 0802    AST 22 09/14/2013 1127   ALT 12 02/22/2021 0802   ALT 19 09/14/2013 1127   ALKPHOS 67 02/22/2021 0802   ALKPHOS 76 09/14/2013 1127   BILITOT 0.6 02/22/2021 0802   BILITOT 0.4 09/14/2013 1127   GFRNONAA >60 02/22/2021 0802   GFRNONAA >60 09/14/2013 1127   GFRAA >60 01/20/2020 1303   GFRAA >60 09/14/2013 1127    No results found for: SPEP, UPEP  Lab Results  Component Value Date   WBC 7.2 02/22/2021   NEUTROABS 4.7 02/22/2021   HGB 13.2 02/22/2021   HCT 40.7 02/22/2021   MCV 92.3 02/22/2021   PLT 206 02/22/2021      Chemistry      Component Value Date/Time   NA 140 02/22/2021 0802   NA 145 (H) 09/08/2019 1359   NA 142 09/14/2013 1127   K 3.9 02/22/2021 0802   K 3.5 09/14/2013 1127   CL 104 02/22/2021 0802   CL 110 (H) 09/14/2013 1127   CO2 28 02/22/2021 0802   CO2 29 09/14/2013 1127   BUN 19 02/22/2021 0802   BUN 15 09/08/2019 1359   BUN 8 09/14/2013 1127   CREATININE 0.78 02/22/2021 0802   CREATININE 0.71 09/14/2013 1127   CREATININE 0.68 04/01/2013 1550      Component Value Date/Time   CALCIUM 8.8 (L) 02/22/2021 0802   CALCIUM 8.6 09/14/2013 1127   ALKPHOS 67 02/22/2021 0802   ALKPHOS 76 09/14/2013 1127   AST 21 02/22/2021 0802   AST 22 09/14/2013 1127   ALT 12 02/22/2021 0802   ALT 19 09/14/2013 1127   BILITOT 0.6 02/22/2021 0802   BILITOT 0.4 09/14/2013 1127       RADIOGRAPHIC STUDIES: I have personally reviewed the radiological images as listed and agreed with the findings in the report. No results found.   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  Clinically STABLE.   # Currently on Calequence 100 mg twice a day x AUG 1st.   Patient tolerating it well except for mild headaches improved with caffeinated drinks. ADD  rituximab weekly x4 and then-months. Again understands treatments are pallaitive; not curative.   # Left shoulder pain/ Chronic arthritis-NSAIDs as needed- STABLE.  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  STABLE.   # Pulmonary hypertension/chronic bilateral lung scarring- [s/p pulmonary evaluation]-improved on albuterol- STABLE.   # DISPOSITION: fridays pt pref # Rituaxn today # in 1 week- labs- cbc/bmp # follow up-MD in 2 weeks [ MD ;labs- cbc/bmp/ldh -Rituxan IV.Dr.B         No orders of the defined types were placed in this encounter.  All questions were answered. The patient knows to call the clinic with any problems, questions or concerns.      Cammie Sickle, MD 02/22/2021 9:25 AM

## 2021-02-22 NOTE — Progress Notes (Signed)
Hand off- new start Rituxan given to Radonna Ricker, RN - infusion

## 2021-02-22 NOTE — Assessment & Plan Note (Signed)
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  Clinically STABLE.   # Currently on Calequence 100 mg twice a day x AUG 1st.  Patient tolerating it well except for mild headaches improved with caffeinated drinks. ADD  rituximab weekly x4 and then-months. Again understands treatments are pallaitive; not curative.   # Left shoulder pain/ Chronic arthritis-NSAIDs as needed- STABLE.  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  STABLE.   # Pulmonary hypertension/chronic bilateral lung scarring- [s/p pulmonary evaluation]-improved on albuterol- STABLE.   # DISPOSITION: fridays pt pref # Rituaxn today # in 1 week- labs- cbc/bmp # follow up-MD in 2 weeks [ MD ;labs- cbc/bmp/ldh -Rituxan IV.Dr.B

## 2021-02-22 NOTE — Progress Notes (Signed)
1324- Mild edema/swelling noted to patient's left arm above left wrist IV site. Ruxience infusion and 0.9% Sodium Chloride infusion paused. Left wrist IV site flushes without difficulty and positive blood return noted. Patient reports left wrist IV site does not hurt. Left wrist IV site is being removed as a precaution to ensure site has not infiltrated. New IV site is being started. MD, Dr. Rogue Bussing, notified and aware. Per MD: No new orders at this time.   1336- Ruxience infusion and 0.9% Sodium Chloride infusion restarted and infusing through new IV site in right hand.   1558- Left arm edema/swelling has completely resolved. Patient reports no discomfort to left arm.  1603- Patient tolerated Ruxience treatment well. Patient and vital signs stable. Patient discharged to home at this time.

## 2021-03-01 ENCOUNTER — Inpatient Hospital Stay: Payer: Medicare HMO

## 2021-03-01 DIAGNOSIS — Z5112 Encounter for antineoplastic immunotherapy: Secondary | ICD-10-CM | POA: Diagnosis not present

## 2021-03-01 DIAGNOSIS — C8311 Mantle cell lymphoma, lymph nodes of head, face, and neck: Secondary | ICD-10-CM | POA: Diagnosis not present

## 2021-03-01 LAB — BASIC METABOLIC PANEL
Anion gap: 7 (ref 5–15)
BUN: 16 mg/dL (ref 8–23)
CO2: 30 mmol/L (ref 22–32)
Calcium: 8.5 mg/dL — ABNORMAL LOW (ref 8.9–10.3)
Chloride: 101 mmol/L (ref 98–111)
Creatinine, Ser: 0.95 mg/dL (ref 0.44–1.00)
GFR, Estimated: >60 mL/min (ref >60)
Glucose, Bld: 110 mg/dL — ABNORMAL HIGH (ref 70–99)
Potassium: 3.5 mmol/L (ref 3.5–5.1)
Sodium: 138 mmol/L (ref 135–145)

## 2021-03-01 LAB — CBC WITH DIFFERENTIAL/PLATELET
Abs Immature Granulocytes: 0.02 10*3/uL (ref 0.00–0.07)
Basophils Absolute: 0 10*3/uL (ref 0.0–0.1)
Basophils Relative: 0 %
Eosinophils Absolute: 0.2 10*3/uL (ref 0.0–0.5)
Eosinophils Relative: 3 %
HCT: 40.4 % (ref 36.0–46.0)
Hemoglobin: 12.9 g/dL (ref 12.0–15.0)
Immature Granulocytes: 0 %
Lymphocytes Relative: 17 %
Lymphs Abs: 1.1 10*3/uL (ref 0.7–4.0)
MCH: 29.9 pg (ref 26.0–34.0)
MCHC: 31.9 g/dL (ref 30.0–36.0)
MCV: 93.7 fL (ref 80.0–100.0)
Monocytes Absolute: 0.4 10*3/uL (ref 0.1–1.0)
Monocytes Relative: 7 %
Neutro Abs: 4.6 10*3/uL (ref 1.7–7.7)
Neutrophils Relative %: 73 %
Platelets: 179 10*3/uL (ref 150–400)
RBC: 4.31 MIL/uL (ref 3.87–5.11)
RDW: 15.2 % (ref 11.5–15.5)
WBC: 6.3 10*3/uL (ref 4.0–10.5)
nRBC: 0 % (ref 0.0–0.2)

## 2021-03-06 ENCOUNTER — Other Ambulatory Visit (HOSPITAL_COMMUNITY): Payer: Self-pay

## 2021-03-06 ENCOUNTER — Other Ambulatory Visit: Payer: Self-pay | Admitting: Pharmacist

## 2021-03-06 DIAGNOSIS — C8311 Mantle cell lymphoma, lymph nodes of head, face, and neck: Secondary | ICD-10-CM

## 2021-03-06 MED ORDER — CALQUENCE 100 MG PO TABS
100.0000 mg | ORAL_TABLET | Freq: Two times a day (BID) | ORAL | 2 refills | Status: DC
  Filled 2021-03-06: qty 60, fill #0
  Filled 2021-03-19: qty 60, 30d supply, fill #0

## 2021-03-08 ENCOUNTER — Encounter: Payer: Self-pay | Admitting: Internal Medicine

## 2021-03-08 ENCOUNTER — Inpatient Hospital Stay (HOSPITAL_BASED_OUTPATIENT_CLINIC_OR_DEPARTMENT_OTHER): Payer: Medicare HMO | Admitting: Internal Medicine

## 2021-03-08 ENCOUNTER — Inpatient Hospital Stay: Payer: Medicare HMO | Attending: Internal Medicine

## 2021-03-08 ENCOUNTER — Inpatient Hospital Stay: Payer: Medicare HMO

## 2021-03-08 VITALS — BP 113/67 | HR 56 | Temp 97.8°F | Resp 20 | Wt 251.5 lb

## 2021-03-08 VITALS — BP 107/67 | HR 61

## 2021-03-08 DIAGNOSIS — Z5112 Encounter for antineoplastic immunotherapy: Secondary | ICD-10-CM | POA: Diagnosis not present

## 2021-03-08 DIAGNOSIS — C8311 Mantle cell lymphoma, lymph nodes of head, face, and neck: Secondary | ICD-10-CM | POA: Insufficient documentation

## 2021-03-08 LAB — BASIC METABOLIC PANEL
Anion gap: 7 (ref 5–15)
BUN: 18 mg/dL (ref 8–23)
CO2: 27 mmol/L (ref 22–32)
Calcium: 8.7 mg/dL — ABNORMAL LOW (ref 8.9–10.3)
Chloride: 103 mmol/L (ref 98–111)
Creatinine, Ser: 0.76 mg/dL (ref 0.44–1.00)
GFR, Estimated: >60 mL/min (ref >60)
Glucose, Bld: 103 mg/dL — ABNORMAL HIGH (ref 70–99)
Potassium: 4 mmol/L (ref 3.5–5.1)
Sodium: 137 mmol/L (ref 135–145)

## 2021-03-08 LAB — CBC WITH DIFFERENTIAL/PLATELET
Abs Immature Granulocytes: 0.03 10*3/uL (ref 0.00–0.07)
Basophils Absolute: 0 10*3/uL (ref 0.0–0.1)
Basophils Relative: 0 %
Eosinophils Absolute: 0.1 10*3/uL (ref 0.0–0.5)
Eosinophils Relative: 2 %
HCT: 40.7 % (ref 36.0–46.0)
Hemoglobin: 13.1 g/dL (ref 12.0–15.0)
Immature Granulocytes: 0 %
Lymphocytes Relative: 19 %
Lymphs Abs: 1.4 10*3/uL (ref 0.7–4.0)
MCH: 30 pg (ref 26.0–34.0)
MCHC: 32.2 g/dL (ref 30.0–36.0)
MCV: 93.3 fL (ref 80.0–100.0)
Monocytes Absolute: 0.5 10*3/uL (ref 0.1–1.0)
Monocytes Relative: 7 %
Neutro Abs: 5.4 10*3/uL (ref 1.7–7.7)
Neutrophils Relative %: 72 %
Platelets: 182 10*3/uL (ref 150–400)
RBC: 4.36 MIL/uL (ref 3.87–5.11)
RDW: 15.1 % (ref 11.5–15.5)
WBC: 7.6 10*3/uL (ref 4.0–10.5)
nRBC: 0 % (ref 0.0–0.2)

## 2021-03-08 LAB — LACTATE DEHYDROGENASE: LDH: 162 U/L (ref 98–192)

## 2021-03-08 MED ORDER — SODIUM CHLORIDE 0.9 % IV SOLN
Freq: Once | INTRAVENOUS | Status: AC
  Filled 2021-03-08: qty 250

## 2021-03-08 MED ORDER — DIPHENHYDRAMINE HCL 25 MG PO CAPS
50.0000 mg | ORAL_CAPSULE | Freq: Once | ORAL | Status: AC
  Administered 2021-03-08: 50 mg via ORAL
  Filled 2021-03-08: qty 2

## 2021-03-08 MED ORDER — SODIUM CHLORIDE 0.9 % IV SOLN
20.0000 mg | Freq: Once | INTRAVENOUS | Status: AC
  Administered 2021-03-08: 20 mg via INTRAVENOUS
  Filled 2021-03-08: qty 20

## 2021-03-08 MED ORDER — SODIUM CHLORIDE 0.9 % IV SOLN
375.0000 mg/m2 | Freq: Once | INTRAVENOUS | Status: AC
  Administered 2021-03-08: 900 mg via INTRAVENOUS
  Filled 2021-03-08: qty 50

## 2021-03-08 MED ORDER — ACETAMINOPHEN 325 MG PO TABS
650.0000 mg | ORAL_TABLET | Freq: Once | ORAL | Status: AC
  Administered 2021-03-08: 650 mg via ORAL
  Filled 2021-03-08: qty 2

## 2021-03-08 NOTE — Progress Notes (Signed)
[facility] OFFICE PROGRESS NOTE  Patient Care Team: Burnard Hawthorne, FNP as PCP - General (Family Medicine) End, Harrell Gave, MD as PCP - Cardiology (Cardiology) Deboraha Sprang, MD as PCP - Electrophysiology (Cardiology) Jackolyn Confer, MD (Internal Medicine) Cammie Sickle, MD as Consulting Physician (Internal Medicine)  Cancer Staging No matching staging information was found for the patient.   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 Feb 9th- START Benda-Ritux with neulasta; Prolonged neutropenia; DISCONT- Benda-Ritux;   # April 13 th 2017- START R-CHOP x1; severe/prolonged neutropenia; PET- CR; BMBx-Neg; Disc R-CHOP  # 26th MAY 2017- Start Rituxan q 50M Main OCT 12th 2017- PET NED.  Stop Rituxan maintenance x 2 years [aug 2019-pneumonia]  # June 2022- DIAGNOSIS: thickened cortex of 12 mm. An additional lymph node demonstrates a thickened cortex of 7 mm. A third lymph node demonstrates a mildly thickened cortex of 4 mm A. LYMPH NODE, LEFT AXILLA; ULTRASOUND-GUIDED BIOPSY:  - CD5+ MONOCLONAL B-CELL POPULATION; COMPATIBLE WITH INVOLVEMENT BY THE  PATIENT'S KNOWN MANTLE CELL LYMPHOMA.   #July 2022-second week-started ibrutinib 420 mg a dayx 1 week-stop because of severe rash.  Significant clinical response noted.  # AUG 1st, 2022- start acalbrutinib. 9/23-2022-rituximab weekly.  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   Michelle [name] 80 y.o.  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  Patient received rituximab infusion 2 weeks ago.  Tolerated well.  No infusion reactions.  No shortness of breath no cough.  Mild right shoulder pain not any worse.  She continues to be compliant with her oral Calquence.  Review of Systems  Constitutional:  Negative for chills, diaphoresis, fever, malaise/fatigue and weight loss.  HENT:  Negative for nosebleeds and sore throat.   Eyes:  Negative for double vision.  Respiratory:  Negative for hemoptysis, sputum production and wheezing.   Cardiovascular:  Negative for chest pain, palpitations, orthopnea and leg swelling.  Gastrointestinal:  Negative for abdominal pain, blood in stool, constipation, diarrhea, heartburn, melena, nausea and vomiting.  Genitourinary:  Negative for dysuria, frequency and urgency.  Musculoskeletal:  Positive for back pain and joint pain.  Skin: Negative.  Negative for itching and rash.  Neurological:  Negative for dizziness, tingling, focal weakness, weakness and headaches.  Endo/Heme/Allergies:  Does not bruise/bleed easily.  Psychiatric/Behavioral:  Negative for depression. The patient is not nervous/anxious and does not have insomnia.    PAST MEDICAL HISTORY :  Past Medical History:  Diagnosis Date   Arthritis    Collagen vascular disease ([place])    GERD (gastroesophageal reflux disease)    Headache(784.0)    History of methotrexate therapy    Hyperlipidemia    hx   Lymphadenopathy of head and neck 01/2015   see on Thyroid ultrasound   Lymphoma, mantle cell ([place]) 06/01/2015  bx of lymph node in right breast/Stage IV Mantle Cell Lymphoma   Personal history of chemotherapy    Rheumatoid arthritis ([place])     PAST SURGICAL HISTORY :   Past Surgical History:  Procedure  Laterality Date   BREAST BIOPSY Left 11/07/2020   u/s bx-hydromark #3 "coil"-path pending   CARDIAC CATHETERIZATION  09/2004   [facility]; EF 60%   CARDIAC CATHETERIZATION  08/2004   [facility]   IR FLUORO GUIDED NEEDLE PLC ASPIRATION/INJECTION LOC  06/18/2018   PERIPHERAL VASCULAR CATHETERIZATION N/A 07/04/2015   Procedure: [name] Insertion;  Surgeon: [name], MD;  Location: [facility];  Service: Cardiovascular;  Laterality: N/A;   PORTA CATH REMOVAL N/A 06/23/2018   Procedure: PORTA CATH REMOVAL;  Surgeon: [name], MD;  Location: [facility];  Service: Cardiovascular;  Laterality: N/A;   RIGHT/LEFT HEART CATH AND CORONARY ANGIOGRAPHY Bilateral 09/20/2019   Procedure: RIGHT/LEFT HEART CATH AND CORONARY ANGIOGRAPHY;  Surgeon: Nelva Bush, MD;  Location: [facility];  Service: Cardiovascular;  Laterality: Bilateral;    FAMILY HISTORY :   Family History  Problem Relation Age of Onset   Diabetes Mother    Cholelithiasis Mother    Hypertension Sister    Diabetes Sister    Heart murmur Sister    Arthritis Brother    Breast cancer Neg Hx     SOCIAL HISTORY:   Social History   Tobacco Use   Smoking status: Never   Smokeless tobacco: Never  Vaping Use   Vaping Use: Never used  Substance Use Topics   Alcohol use: No   Drug use: No    ALLERGIES:  has No Known Allergies.  MEDICATIONS:  Current Outpatient Medications  Medication Sig Dispense Refill   Acalabrutinib Maleate (CALQUENCE) 100 MG TABS Take 1 capsule (100 mg) by mouth in the morning and at bedtime. 60 tablet 2   acetaminophen (TYLENOL) 500 MG tablet Take 1,000 mg by mouth every 6 (six) hours as needed for mild pain.      albuterol (VENTOLIN HFA) 108 (90 Base) MCG/ACT inhaler TAKE 2 PUFFS BY MOUTH EVERY 6 HOURS AS NEEDED FOR WHEEZE OR SHORTNESS OF BREATH 18 each 1   carvedilol (COREG) 3.125 MG tablet TAKE 1 TABLET BY MOUTH TWICE A DAY 180 tablet 0   cyanocobalamin 2000 MCG tablet Take 2,500  mcg by mouth daily.      fluticasone (FLONASE) 50 MCG/ACT nasal spray Place 2 sprays into both nostrils daily as needed for allergies. 16 g 4   furosemide (LASIX) 40 MG tablet TAKE 1 TABLET BY MOUTH EVERY DAY 90 tablet 0   losartan (COZAAR) 25 MG tablet TAKE 1 TABLET BY MOUTH EVERY DAY 90 tablet 1   MEGARED OMEGA-3 KRILL OIL PO Take 1 tablet by mouth daily.     meloxicam (MOBIC) 7.5 MG tablet Take 1 tablet (7.5 mg total) by mouth daily as needed for pain. 30 tablet 1   Multiple Vitamins-Minerals (CENTRUM SILVER 50+WOMEN) TABS Take by mouth.     Multiple Vitamins-Minerals (HAIR SKIN AND NAILS FORMULA PO) Take by mouth.     rosuvastatin (CRESTOR) 5 MG tablet Take 1 tablet (5 mg total) by mouth daily. 90 tablet 3   spironolactone (ALDACTONE) 25 MG tablet Take 0.5 tablets (12.5 mg total) by mouth daily. 45 tablet 3   No current facility-administered medications for this visit.   Facility-Administered Medications Ordered in Other Visits  Medication Dose Route Frequency Provider Last Rate Last  Admin   heparin lock flush 100 unit/mL  500 Units Intravenous Once Charlaine Dalton R, MD       sodium chloride flush (NS) 0.9 % injection 10 mL  10 mL Intravenous Once Charlaine Dalton R, MD       sodium chloride flush (NS) 0.9 % injection 10 mL  10 mL Intravenous Once Cammie Sickle, MD       Tbo-Filgrastim [facility]) injection 480 mcg  480 mcg Subcutaneous Once Cammie Sickle, MD        PHYSICAL EXAMINATION: ECOG PERFORMANCE STATUS: 0 - Asymptomatic  BP 113/67   Pulse (!) 56   Temp 97.8 F (36.6 C)   Resp 20   Wt 251 lb 8 oz (114.1 kg)   SpO2 100%   BMI 39.38 kg/m   Filed Weights   03/08/21 0836  Weight: 251 lb 8 oz (114.1 kg)    Physical Exam Constitutional:      Comments: Obese.  Walking herself with cane.  Accompanied by her sister.  HENT:     Head: Normocephalic and atraumatic.     Mouth/Throat:     Pharynx: No oropharyngeal exudate.  Eyes:     Pupils: Pupils  are equal, round, and reactive to light.  Cardiovascular:     Rate and Rhythm: Normal rate and regular rhythm.  Pulmonary:     Effort: No respiratory distress.     Breath sounds: No wheezing.  Abdominal:     General: Bowel sounds are normal. There is no distension.     Palpations: Abdomen is soft. There is no mass.     Tenderness: There is no abdominal tenderness. There is no guarding or rebound.  Musculoskeletal:        General: No tenderness. Normal range of motion.     Cervical back: Normal range of motion and neck supple.  Skin:    General: Skin is warm.     Comments: Resolution of the 4 to 5 cm soft mass noted on the left posterior shoulder.  And also resolution of the left axillary lymphadenopathy.  Neurological:     Mental Status: She is alert and oriented to person, place, and time.  Psychiatric:        Mood and Affect: Affect normal.   LABORATORY DATA:  I have reviewed the data as listed    Component Value Date/Time   NA 137 03/08/2021 0821   NA 145 (H) 09/08/2019 1359   NA 142 09/14/2013 1127   K 4.0 03/08/2021 0821   K 3.5 09/14/2013 1127   CL 103 03/08/2021 0821   CL 110 (H) 09/14/2013 1127   CO2 27 03/08/2021 0821   CO2 29 09/14/2013 1127   GLUCOSE 103 (H) 03/08/2021 0821   GLUCOSE 106 (H) 09/14/2013 1127   BUN 18 03/08/2021 0821   BUN 15 09/08/2019 1359   BUN 8 09/14/2013 1127   CREATININE 0.76 03/08/2021 0821   CREATININE 0.71 09/14/2013 1127   CREATININE 0.68 04/01/2013 1550   CALCIUM 8.7 (L) 03/08/2021 0821   CALCIUM 8.6 09/14/2013 1127   PROT 6.5 02/22/2021 0802   PROT 7.6 09/14/2013 1127   ALBUMIN 3.7 02/22/2021 0802   ALBUMIN 3.2 (L) 09/14/2013 1127   AST 21 02/22/2021 0802   AST 22 09/14/2013 1127   ALT 12 02/22/2021 0802   ALT 19 09/14/2013 1127   ALKPHOS 67 02/22/2021 0802   ALKPHOS 76 09/14/2013 1127   BILITOT 0.6 02/22/2021 0802   BILITOT 0.4 09/14/2013 1127  GFRNONAA >60 03/08/2021 0821   GFRNONAA >60 09/14/2013 1127   GFRAA >60  01/20/2020 1303   GFRAA >60 09/14/2013 1127    No results found for: SPEP, UPEP  Lab Results  Component Value Date   WBC 7.6 03/08/2021   NEUTROABS 5.4 03/08/2021   HGB 13.1 03/08/2021   HCT 40.7 03/08/2021   MCV 93.3 03/08/2021   PLT 182 03/08/2021      Chemistry      Component Value Date/Time   NA 137 03/08/2021 0821   NA 145 (H) 09/08/2019 1359   NA 142 09/14/2013 1127   K 4.0 03/08/2021 0821   K 3.5 09/14/2013 1127   CL 103 03/08/2021 0821   CL 110 (H) 09/14/2013 1127   CO2 27 03/08/2021 0821   CO2 29 09/14/2013 1127   BUN 18 03/08/2021 0821   BUN 15 09/08/2019 1359   BUN 8 09/14/2013 1127   CREATININE 0.76 03/08/2021 0821   CREATININE 0.71 09/14/2013 1127   CREATININE 0.68 04/01/2013 1550      Component Value Date/Time   CALCIUM 8.7 (L) 03/08/2021 0821   CALCIUM 8.6 09/14/2013 1127   ALKPHOS 67 02/22/2021 0802   ALKPHOS 76 09/14/2013 1127   AST 21 02/22/2021 0802   AST 22 09/14/2013 1127   ALT 12 02/22/2021 0802   ALT 19 09/14/2013 1127   BILITOT 0.6 02/22/2021 0802   BILITOT 0.4 09/14/2013 1127       RADIOGRAPHIC STUDIES: I have personally reviewed the radiological images as listed and agreed with the findings in the report. No results found.   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  Clinically STABLE.   # Currently on Calequence 100 mg twice a day x AUG 1st.  Proceed with weekly rituximab No. 2 today of planned 4 and then monthly.  Tolerating treatment extremely well.  # Left shoulder pain/ Chronic arthritis-NSAIDs as needed- STABLE.   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  STABLE.   # Pulmonary hypertension/chronic bilateral lung scarring- [s/p pulmonary evaluation]-improved on albuterol- STABLE.   # DISPOSITION: fridays pt pref # Rituaxn today # in 1 week-Rituxan labs- cbc/bmp # follow up-MD in 2 weeks [ MD ;labs- cbc/bmp/ldh -Rituxan IV.Dr.B       Orders Placed This Encounter  Procedures   CBC with Differential    Standing Status:   Standing    Number of Occurrences:   10    Standing Expiration Date:   47/11/5463   Basic metabolic panel    Standing Status:   Standing    Number of Occurrences:   10    Standing Expiration Date:   03/08/2022   Lactate dehydrogenase    Standing Status:   Future    Standing Expiration Date:   03/08/2022    All questions were answered. The patient knows to call the clinic with any problems, questions or concerns.      Cammie Sickle, MD 03/08/2021 9:29 AM

## 2021-03-08 NOTE — Patient Instructions (Signed)
[facility]ENTER [facility]  Discharge Instructions: Thank you for choosing [facility] to provide your oncology and hematology care.  If you have a lab appointment with the Cancer Center, please go directly to the Cancer Center and check in at the registration area.  Wear comfortable clothing and clothing appropriate for easy access to any Portacath or PICC line.   We strive to give you quality time with your provider. You may need to reschedule your appointment if you arrive late (15 or more minutes).  Arriving late affects you and other patients whose appointments are after yours.  Also, if you miss three or more appointments without notifying the office, you may be dismissed from the clinic at the provider's discretion.      For prescription refill requests, have your pharmacy contact our office and allow 72 hours for refills to be completed.      To help prevent nausea and vomiting after your treatment, we encourage you to take your nausea medication as directed.  BELOW ARE SYMPTOMS THAT SHOULD BE REPORTED IMMEDIATELY: *FEVER GREATER THAN 100.4 F (38 C) OR HIGHER *CHILLS OR SWEATING *NAUSEA AND VOMITING THAT IS NOT CONTROLLED WITH YOUR NAUSEA MEDICATION *UNUSUAL SHORTNESS OF BREATH *UNUSUAL BRUISING OR BLEEDING *URINARY PROBLEMS (pain or burning when urinating, or frequent urination) *BOWEL PROBLEMS (unusual diarrhea, constipation, pain near the anus) TENDERNESS IN MOUTH AND THROAT WITH OR WITHOUT PRESENCE OF ULCERS (sore throat, sores in mouth, or a toothache) UNUSUAL RASH, SWELLING OR PAIN  UNUSUAL VAGINAL DISCHARGE OR ITCHING   Items with * indicate a potential emergency and should be followed up as soon as possible or go to the Emergency Department if any problems should occur.  Please show the CHEMOTHERAPY ALERT CARD or IMMUNOTHERAPY ALERT CARD at check-in to the Emergency Department and triage nurse.  Should you have questions after your  visit or need to cancel or reschedule your appointment, please contact [facility]  [phone] and follow the prompts.  Office hours are 8:00 a.m. to 4:30 p.m. Monday - Friday. Please note that voicemails left after 4:00 p.m. may not be returned until the following business day.  We are closed weekends and major holidays. You have access to a nurse at all times for urgent questions. Please call the main number to the clinic [phone] and follow the prompts.  For any non-urgent questions, you may also contact your provider using MyChart. We now offer e-Visits for anyone 18 and older to request care online for non-urgent symptoms. For details visit [email or web address].   Also download the MyChart app! Go to the app store, search "MyChart", open the app, select [facility], and log in with your MyChart username and password.  Due to Covid, a mask is required upon entering the hospital/clinic. If you do not have a mask, one will be given to you upon arrival. For doctor visits, patients may have 1 support person aged 18 or older with them. For treatment visits, patients cannot have anyone with them due to current Covid guidelines and our immunocompromised population.  

## 2021-03-08 NOTE — Assessment & Plan Note (Signed)
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  Clinically STABLE.   # Currently on Calequence 100 mg twice a day x AUG 1st.  Proceed with weekly rituximab No. 2 today of planned 4 and then monthly.  Tolerating treatment extremely well.  # Left shoulder pain/ Chronic arthritis-NSAIDs as needed- STABLE.   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  STABLE.   # Pulmonary hypertension/chronic bilateral lung scarring- [s/p pulmonary evaluation]-improved on albuterol- STABLE.   # DISPOSITION: fridays pt pref # Rituaxn today # in 1 week-Rituxan labs- cbc/bmp # follow up-MD in 2 weeks [ MD ;labs- cbc/bmp/ldh -Rituxan IV.Dr.B

## 2021-03-14 ENCOUNTER — Other Ambulatory Visit (HOSPITAL_COMMUNITY): Payer: Self-pay

## 2021-03-15 ENCOUNTER — Inpatient Hospital Stay: Payer: Medicare HMO

## 2021-03-15 ENCOUNTER — Other Ambulatory Visit: Payer: Self-pay

## 2021-03-15 ENCOUNTER — Other Ambulatory Visit: Payer: Self-pay | Admitting: Internal Medicine

## 2021-03-15 VITALS — BP 126/81 | HR 66 | Temp 97.1°F | Wt 254.0 lb

## 2021-03-15 DIAGNOSIS — C8311 Mantle cell lymphoma, lymph nodes of head, face, and neck: Secondary | ICD-10-CM

## 2021-03-15 DIAGNOSIS — Z5112 Encounter for antineoplastic immunotherapy: Secondary | ICD-10-CM | POA: Diagnosis not present

## 2021-03-15 LAB — BASIC METABOLIC PANEL
Anion gap: 7 (ref 5–15)
BUN: 19 mg/dL (ref 8–23)
CO2: 28 mmol/L (ref 22–32)
Calcium: 8.8 mg/dL — ABNORMAL LOW (ref 8.9–10.3)
Chloride: 101 mmol/L (ref 98–111)
Creatinine, Ser: 0.68 mg/dL (ref 0.44–1.00)
GFR, Estimated: >60 mL/min (ref >60)
Glucose, Bld: 98 mg/dL (ref 70–99)
Potassium: 4.8 mmol/L (ref 3.5–5.1)
Sodium: 136 mmol/L (ref 135–145)

## 2021-03-15 LAB — CBC WITH DIFFERENTIAL/PLATELET
Abs Immature Granulocytes: 0.03 10*3/uL (ref 0.00–0.07)
Basophils Absolute: 0 10*3/uL (ref 0.0–0.1)
Basophils Relative: 0 %
Eosinophils Absolute: 0.2 10*3/uL (ref 0.0–0.5)
Eosinophils Relative: 3 %
HCT: 40.6 % (ref 36.0–46.0)
Hemoglobin: 13 g/dL (ref 12.0–15.0)
Immature Granulocytes: 0 %
Lymphocytes Relative: 21 %
Lymphs Abs: 1.4 10*3/uL (ref 0.7–4.0)
MCH: 30.2 pg (ref 26.0–34.0)
MCHC: 32 g/dL (ref 30.0–36.0)
MCV: 94.2 fL (ref 80.0–100.0)
Monocytes Absolute: 0.5 10*3/uL (ref 0.1–1.0)
Monocytes Relative: 8 %
Neutro Abs: 4.7 10*3/uL (ref 1.7–7.7)
Neutrophils Relative %: 68 %
Platelets: 190 10*3/uL (ref 150–400)
RBC: 4.31 MIL/uL (ref 3.87–5.11)
RDW: 15 % (ref 11.5–15.5)
WBC: 6.9 10*3/uL (ref 4.0–10.5)
nRBC: 0 % (ref 0.0–0.2)

## 2021-03-15 MED ORDER — SODIUM CHLORIDE 0.9 % IV SOLN
Freq: Once | INTRAVENOUS | Status: AC
  Filled 2021-03-15: qty 250

## 2021-03-15 MED ORDER — SODIUM CHLORIDE 0.9 % IV SOLN
20.0000 mg | Freq: Once | INTRAVENOUS | Status: AC
  Administered 2021-03-15: 20 mg via INTRAVENOUS
  Filled 2021-03-15: qty 20

## 2021-03-15 MED ORDER — DIPHENHYDRAMINE HCL 25 MG PO CAPS
50.0000 mg | ORAL_CAPSULE | Freq: Once | ORAL | Status: AC
  Administered 2021-03-15: 50 mg via ORAL
  Filled 2021-03-15: qty 2

## 2021-03-15 MED ORDER — ACETAMINOPHEN 325 MG PO TABS
650.0000 mg | ORAL_TABLET | Freq: Once | ORAL | Status: AC
  Administered 2021-03-15: 650 mg via ORAL
  Filled 2021-03-15: qty 2

## 2021-03-15 MED ORDER — SODIUM CHLORIDE 0.9 % IV SOLN
375.0000 mg/m2 | Freq: Once | INTRAVENOUS | Status: AC
  Administered 2021-03-15: 900 mg via INTRAVENOUS
  Filled 2021-03-15: qty 50

## 2021-03-15 NOTE — Patient Instructions (Signed)
[facility]  Discharge Instructions: Thank you for choosing [facility] to provide your oncology and hematology care.  If you have a lab appointment with the [facility], please go directly to the [facility] and check in at the registration area.  Wear comfortable clothing and clothing appropriate for easy access to any Portacath or PICC line.   We strive to give you quality time with your provider. You may need to reschedule your appointment if you arrive late (15 or more minutes).  Arriving late affects you and other patients whose appointments are after yours.  Also, if you miss three or more appointments without notifying the office, you may be dismissed from the clinic at the provider's discretion.      For prescription refill requests, have your pharmacy contact our office and allow 72 hours for refills to be completed.    Today you received the following chemotherapy and/or immunotherapy agents : Ruxience   To help prevent nausea and vomiting after your treatment, we encourage you to take your nausea medication as directed.  BELOW ARE SYMPTOMS THAT SHOULD BE REPORTED IMMEDIATELY: *FEVER GREATER THAN 100.4 F (38 C) OR HIGHER *CHILLS OR SWEATING *NAUSEA AND VOMITING THAT IS NOT CONTROLLED WITH YOUR NAUSEA MEDICATION *UNUSUAL SHORTNESS OF BREATH *UNUSUAL BRUISING OR BLEEDING *URINARY PROBLEMS (pain or burning when urinating, or frequent urination) *BOWEL PROBLEMS (unusual diarrhea, constipation, pain near the anus) TENDERNESS IN MOUTH AND THROAT WITH OR WITHOUT PRESENCE OF ULCERS (sore throat, sores in mouth, or a toothache) UNUSUAL RASH, SWELLING OR PAIN  UNUSUAL VAGINAL DISCHARGE OR ITCHING   Items with * indicate a potential emergency and should be followed up as soon as possible or go to the Emergency Department if any problems should occur.  Please show the CHEMOTHERAPY ALERT CARD or IMMUNOTHERAPY ALERT CARD at check-in to  the Emergency Department and triage nurse.  Should you have questions after your visit or need to cancel or reschedule your appointment, please contact [facility]  [phone] and follow the prompts.  Office hours are 8:00 a.m. to 4:30 p.m. Monday - Friday. Please note that voicemails left after 4:00 p.m. may not be returned until the following business day.  We are closed weekends and major holidays. You have access to a nurse at all times for urgent questions. Please call the main number to the clinic [phone] and follow the prompts.  For any non-urgent questions, you may also contact your provider using MyChart. We now offer e-Visits for anyone 93 and older to request care online for non-urgent symptoms. For details visit [email or web address].   Also download the MyChart app! Go to the app store, search "MyChart", open the app, select [facility], and log in with your MyChart username and password.  Due to Covid, a mask is required upon entering the hospital/clinic. If you do not have a mask, one will be given to you upon arrival. For doctor visits, patients may have 1 support person aged 77 or older with them. For treatment visits, patients cannot have anyone with them due to current Covid guidelines and our immunocompromised population.

## 2021-03-18 ENCOUNTER — Encounter: Payer: Self-pay | Admitting: Internal Medicine

## 2021-03-18 ENCOUNTER — Telehealth: Payer: Self-pay | Admitting: Pharmacy Technician

## 2021-03-18 ENCOUNTER — Other Ambulatory Visit (HOSPITAL_COMMUNITY): Payer: Self-pay

## 2021-03-18 NOTE — Telephone Encounter (Signed)
Oral Oncology Patient Advocate Encounter   Was successful in securing patient an $63 grant from [facility]) to provide copayment coverage for Calquence.  This will keep the out of pocket expense at $0.     I have spoken with the patient.    The billing information is as follows and has been shared with [facility].   Member ID: [id number] Group ID: [id number] RxBin: [id number] Dates of Eligibility: 12/18/20 through 03/17/22  Fund:  [facility] Patient [place] Phone [phone] Fax [phone] 03/18/2021 3:11 PM

## 2021-03-19 ENCOUNTER — Other Ambulatory Visit (HOSPITAL_COMMUNITY): Payer: Self-pay

## 2021-03-21 ENCOUNTER — Telehealth: Payer: Self-pay | Admitting: Pharmacy Technician

## 2021-03-21 ENCOUNTER — Other Ambulatory Visit (HOSPITAL_COMMUNITY): Payer: Self-pay

## 2021-03-21 NOTE — Telephone Encounter (Signed)
Oral Oncology Patient Advocate Encounter   Received notification from [facility] that prior authorization for Calquence tablets is required.   PA submitted on CoverMyMeds Key [id number] Status is pending   Oral Oncology Clinic will continue to follow.  [place] Patient [place] Phone [phone] Fax [phone] 03/21/2021 11:26 AM

## 2021-03-21 NOTE — Telephone Encounter (Signed)
Oral Oncology Patient Advocate Encounter  Prior Authorization for Calquence tablets has been approved.    PA# [id number] Effective dates: 03/21/21 through 06/01/22  Oral Oncology Clinic will continue to follow.   [place] Patient [place] Phone [phone] Fax [phone] 03/21/2021 11:28 AM

## 2021-03-22 ENCOUNTER — Inpatient Hospital Stay: Payer: Medicare HMO

## 2021-03-22 ENCOUNTER — Inpatient Hospital Stay: Payer: Medicare HMO | Admitting: Internal Medicine

## 2021-03-22 ENCOUNTER — Other Ambulatory Visit: Payer: Self-pay

## 2021-03-22 VITALS — BP 112/63 | HR 63 | Temp 96.2°F | Resp 18 | Wt 252.2 lb

## 2021-03-22 DIAGNOSIS — C8311 Mantle cell lymphoma, lymph nodes of head, face, and neck: Secondary | ICD-10-CM

## 2021-03-22 DIAGNOSIS — Z5112 Encounter for antineoplastic immunotherapy: Secondary | ICD-10-CM | POA: Diagnosis not present

## 2021-03-22 LAB — CBC WITH DIFFERENTIAL/PLATELET
Abs Immature Granulocytes: 0.04 10*3/uL (ref 0.00–0.07)
Basophils Absolute: 0 10*3/uL (ref 0.0–0.1)
Basophils Relative: 0 %
Eosinophils Absolute: 0.1 10*3/uL (ref 0.0–0.5)
Eosinophils Relative: 2 %
HCT: 40.3 % (ref 36.0–46.0)
Hemoglobin: 13.1 g/dL (ref 12.0–15.0)
Immature Granulocytes: 1 %
Lymphocytes Relative: 15 %
Lymphs Abs: 1.2 10*3/uL (ref 0.7–4.0)
MCH: 30.5 pg (ref 26.0–34.0)
MCHC: 32.5 g/dL (ref 30.0–36.0)
MCV: 93.9 fL (ref 80.0–100.0)
Monocytes Absolute: 0.6 10*3/uL (ref 0.1–1.0)
Monocytes Relative: 7 %
Neutro Abs: 6.4 10*3/uL (ref 1.7–7.7)
Neutrophils Relative %: 75 %
Platelets: 186 10*3/uL (ref 150–400)
RBC: 4.29 MIL/uL (ref 3.87–5.11)
RDW: 14.9 % (ref 11.5–15.5)
WBC: 8.4 10*3/uL (ref 4.0–10.5)
nRBC: 0 % (ref 0.0–0.2)

## 2021-03-22 LAB — LACTATE DEHYDROGENASE: LDH: 157 U/L (ref 98–192)

## 2021-03-22 LAB — BASIC METABOLIC PANEL
Anion gap: 5 (ref 5–15)
BUN: 24 mg/dL — ABNORMAL HIGH (ref 8–23)
CO2: 30 mmol/L (ref 22–32)
Calcium: 8.7 mg/dL — ABNORMAL LOW (ref 8.9–10.3)
Chloride: 100 mmol/L (ref 98–111)
Creatinine, Ser: 0.79 mg/dL (ref 0.44–1.00)
GFR, Estimated: >60 mL/min (ref >60)
Glucose, Bld: 103 mg/dL — ABNORMAL HIGH (ref 70–99)
Potassium: 4 mmol/L (ref 3.5–5.1)
Sodium: 135 mmol/L (ref 135–145)

## 2021-03-22 MED ORDER — SODIUM CHLORIDE 0.9 % IV SOLN
375.0000 mg/m2 | Freq: Once | INTRAVENOUS | Status: AC
  Administered 2021-03-22: 900 mg via INTRAVENOUS
  Filled 2021-03-22: qty 50

## 2021-03-22 MED ORDER — DIPHENHYDRAMINE HCL 25 MG PO CAPS
50.0000 mg | ORAL_CAPSULE | Freq: Once | ORAL | Status: AC
  Administered 2021-03-22: 50 mg via ORAL
  Filled 2021-03-22: qty 2

## 2021-03-22 MED ORDER — SODIUM CHLORIDE 0.9 % IV SOLN
20.0000 mg | Freq: Once | INTRAVENOUS | Status: AC
  Administered 2021-03-22: 20 mg via INTRAVENOUS
  Filled 2021-03-22: qty 20

## 2021-03-22 MED ORDER — ACETAMINOPHEN 325 MG PO TABS
650.0000 mg | ORAL_TABLET | Freq: Once | ORAL | Status: AC
  Administered 2021-03-22: 650 mg via ORAL
  Filled 2021-03-22: qty 2

## 2021-03-22 MED ORDER — SODIUM CHLORIDE 0.9 % IV SOLN
Freq: Once | INTRAVENOUS | Status: AC
  Filled 2021-03-22: qty 250

## 2021-03-22 NOTE — Patient Instructions (Signed)
#  Take half a pill of the furosemide [water pill]-until further directions.

## 2021-03-22 NOTE — Patient Instructions (Signed)
[facility] ONCOLOGY  Discharge Instructions: Thank you for choosing [facility] to provide your oncology and hematology care.  If you have a lab appointment with the [facility], please go directly to the [facility] and check in at the registration area.  Wear comfortable clothing and clothing appropriate for easy access to any Portacath or PICC line.   We strive to give you quality time with your provider. You may need to reschedule your appointment if you arrive late (15 or more minutes).  Arriving late affects you and other patients whose appointments are after yours.  Also, if you miss three or more appointments without notifying the office, you may be dismissed from the clinic at the provider's discretion.      For prescription refill requests, have your pharmacy contact our office and allow 72 hours for refills to be completed.    Today you received the following chemotherapy and/or immunotherapy agents: Ruxience      To help prevent nausea and vomiting after your treatment, we encourage you to take your nausea medication as directed.  BELOW ARE SYMPTOMS THAT SHOULD BE REPORTED IMMEDIATELY: *FEVER GREATER THAN 100.4 F (38 C) OR HIGHER *CHILLS OR SWEATING *NAUSEA AND VOMITING THAT IS NOT CONTROLLED WITH YOUR NAUSEA MEDICATION *UNUSUAL SHORTNESS OF BREATH *UNUSUAL BRUISING OR BLEEDING *URINARY PROBLEMS (pain or burning when urinating, or frequent urination) *BOWEL PROBLEMS (unusual diarrhea, constipation, pain near the anus) TENDERNESS IN MOUTH AND THROAT WITH OR WITHOUT PRESENCE OF ULCERS (sore throat, sores in mouth, or a toothache) UNUSUAL RASH, SWELLING OR PAIN  UNUSUAL VAGINAL DISCHARGE OR ITCHING   Items with * indicate a potential emergency and should be followed up as soon as possible or go to the Emergency Department if any problems should occur.  Please show the CHEMOTHERAPY ALERT CARD or IMMUNOTHERAPY ALERT CARD at check-in  to the Emergency Department and triage nurse.  Should you have questions after your visit or need to cancel or reschedule your appointment, please contact [facility]  [phone] and follow the prompts.  Office hours are 8:00 a.m. to 4:30 p.m. Monday - Friday. Please note that voicemails left after 4:00 p.m. may not be returned until the following business day.  We are closed weekends and major holidays. You have access to a nurse at all times for urgent questions. Please call the main number to the clinic [phone] and follow the prompts.  For any non-urgent questions, you may also contact your provider using MyChart. We now offer e-Visits for anyone 78 and older to request care online for non-urgent symptoms. For details visit [email or web address].   Also download the MyChart app! Go to the app store, search "MyChart", open the app, select [facility], and log in with your MyChart username and password.  Due to Covid, a mask is required upon entering the hospital/clinic. If you do not have a mask, one will be given to you upon arrival. For doctor visits, patients may have 1 support person aged 78 or older with them. For treatment visits, patients cannot have anyone with them due to current Covid guidelines and our immunocompromised population. Rituximab Injection What is this medication? RITUXIMAB (ri TUX i mab) is a monoclonal antibody. It is used to treat certain types of cancer like non-Hodgkin lymphoma and chronic lymphocytic leukemia. It is also used to treat rheumatoid arthritis, granulomatosis with polyangiitis, microscopic polyangiitis, and pemphigus vulgaris. This medicine may be used for other purposes; ask your  health care provider or pharmacist if you have questions. COMMON BRAND NAME(S): RIABNI, Rituxan, RUXIENCE What should I tell my care team before I take this medication? They need to know if you have any of these conditions: chest  pain heart disease infection especially a viral infection such as chickenpox, cold sores, hepatitis B, or herpes immune system problems irregular heartbeat or rhythm kidney disease low blood counts (white cells, platelets, or red cells) lung disease recent or upcoming vaccine an unusual or allergic reaction to rituximab, other medicines, foods, dyes, or preservatives pregnant or trying to get pregnant breast-feeding How should I use this medication? This medicine is injected into a vein. It is given by a health care provider in a hospital or clinic setting. A special MedGuide will be given to you before each treatment. Be sure to read this information carefully each time. Talk to your health care provider about the use of this medicine in children. While this drug may be prescribed for children as young as 6 months for selected conditions, precautions do apply. Overdosage: If you think you have taken too much of this medicine contact a poison control center or emergency room at once. NOTE: This medicine is only for you. Do not share this medicine with others. What if I miss a dose? Keep appointments for follow-up doses. It is important not to miss your dose. Call your health care provider if you are unable to keep an appointment. What may interact with this medication? Do not take this medicine with any of the following medicines: live vaccines This medicine may also interact with the following medicines: cisplatin This list may not describe all possible interactions. Give your health care provider a list of all the medicines, herbs, non-prescription drugs, or dietary supplements you use. Also tell them if you smoke, drink alcohol, or use illegal drugs. Some items may interact with your medicine. What should I watch for while using this medication? Your condition will be monitored carefully while you are receiving this medicine. You may need blood work done while you are taking this  medicine. This medicine can cause serious infusion reactions. To reduce the risk your health care provider may give you other medicines to take before receiving this one. Be sure to follow the directions from your health care provider. This medicine may increase your risk of getting an infection. Call your health care provider for advice if you get a fever, chills, sore throat, or other symptoms of a cold or flu. Do not treat yourself. Try to avoid being around people who are sick. Call your health care provider if you are around anyone with measles, chickenpox, or if you develop sores or blisters that do not heal properly. Avoid taking medicines that contain aspirin, acetaminophen, ibuprofen, naproxen, or ketoprofen unless instructed by your health care provider. These medicines may hide a fever. This medicine may cause serious skin reactions. They can happen weeks to months after starting the medicine. Contact your health care provider right away if you notice fevers or flu-like symptoms with a rash. The rash may be red or purple and then turn into blisters or peeling of the skin. Or, you might notice a red rash with swelling of the face, lips or lymph nodes in your neck or under your arms. In some patients, this medicine may cause a serious brain infection that may cause death. If you have any problems seeing, thinking, speaking, walking, or standing, tell your healthcare professional right away. If you cannot reach  your healthcare professional, urgently seek other source of medical care. Do not become pregnant while taking this medicine or for at least 12 months after stopping it. Women should inform their health care provider if they wish to become pregnant or think they might be pregnant. There is potential for serious harm to an unborn child. Talk to your health care provider for more information. Women should use a reliable form of birth control while taking this medicine and for 12 months after  stopping it. Do not breast-feed while taking this medicine or for at least 6 months after stopping it. What side effects may I notice from receiving this medication? Side effects that you should report to your health care provider as soon as possible: allergic reactions (skin rash, itching or hives; swelling of the face, lips, or tongue) diarrhea edema (sudden weight gain; swelling of the ankles, feet, hands or other unusual swelling; trouble breathing) fast, irregular heartbeat heart attack (trouble breathing; pain or tightness in the chest, neck, back or arms; unusually weak or tired) infection (fever, chills, cough, sore throat, pain or trouble passing urine) kidney injury (trouble passing urine or change in the amount of urine) liver injury (dark yellow or brown urine; general ill feeling or flu-like symptoms; loss of appetite, right upper belly pain; unusually weak or tired, yellowing of the eyes or skin) low blood pressure (dizziness; feeling faint or lightheaded, falls; unusually weak or tired) low red blood cell counts (trouble breathing; feeling faint; lightheaded, falls; unusually weak or tired) mouth sores redness, blistering, peeling, or loosening of the skin, including inside the mouth stomach pain unusual bruising or bleeding wheezing (trouble breathing with loud or whistling sounds) vomiting Side effects that usually do not require medical attention (report to your health care provider if they continue or are bothersome): headache joint pain muscle cramps, pain nausea This list may not describe all possible side effects. Call your doctor for medical advice about side effects. You may report side effects to FDA at 1-800-FDA-1088. Where should I keep my medication? This medicine is given in a hospital or clinic. It will not be stored at home. NOTE: This sheet is a summary. It may not cover all possible information. If you have questions about this medicine, talk to your  doctor, pharmacist, or health care provider.  2022 Elsevier/Gold Standard (2020-05-10 15:47:26)

## 2021-03-22 NOTE — Progress Notes (Signed)
Pt c/o dizziness x 1 week. No alleviating or aggravating factors. No other concerns at this time.

## 2021-03-22 NOTE — Progress Notes (Signed)
[facility] OFFICE PROGRESS NOTE  Patient Care Team: Burnard Hawthorne, FNP as PCP - General (Family Medicine) End, Harrell Gave, MD as PCP - Cardiology (Cardiology) Deboraha Sprang, MD as PCP - Electrophysiology (Cardiology) Jackolyn Confer, MD (Internal Medicine) Cammie Sickle, MD as Consulting Physician (Internal Medicine)  Cancer Staging No matching staging information was found for the patient.   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 Feb 9th- START Benda-Ritux with neulasta; Prolonged neutropenia; DISCONT- Benda-Ritux;   # April 13 th 2017- START R-CHOP x1; severe/prolonged neutropenia; PET- CR; BMBx-Neg; Disc R-CHOP  # 26th MAY 2017- Start Rituxan q 24M Main OCT 12th 2017- PET NED.  Stop Rituxan maintenance x 2 years [aug 2019-pneumonia]  # June 2022- DIAGNOSIS: thickened cortex of 12 mm. An additional lymph node demonstrates a thickened cortex of 7 mm. A third lymph node demonstrates a mildly thickened cortex of 4 mm A. LYMPH NODE, LEFT AXILLA; ULTRASOUND-GUIDED BIOPSY:  - CD5+ MONOCLONAL B-CELL POPULATION; COMPATIBLE WITH INVOLVEMENT BY THE  PATIENT'S KNOWN MANTLE CELL LYMPHOMA.   #July 2022-second week-started ibrutinib 420 mg a dayx 1 week-stop because of severe rash.  Significant clinical response noted.  # AUG 1st, 2022- start acalbrutinib. 9/23-2022-rituximab weekly.  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   Michelle [name] 80 y.o.  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  Patient currently on weekly rituximab status post 3 treatments.  Mild shortness of breath.  Mild dizziness.  No falls. She continues to be compliant with her oral Calquence.  Denies any headaches.  Complains of mild constipation.  No abdominal pain.  No blood in stools.  Review of Systems  Constitutional:  Negative for chills, diaphoresis, fever, malaise/fatigue and weight loss.  HENT:  Negative for nosebleeds and sore throat.   Eyes:  Negative for double vision.  Respiratory:  Negative for hemoptysis, sputum production and wheezing.   Cardiovascular:  Negative for chest pain, palpitations, orthopnea and leg swelling.  Gastrointestinal:  Positive for constipation. Negative for abdominal pain, blood in stool, diarrhea, heartburn, melena, nausea and vomiting.  Genitourinary:  Negative for dysuria, frequency and urgency.  Musculoskeletal:  Positive for back pain and joint pain.  Skin: Negative.  Negative for itching and rash.  Neurological:  Negative for dizziness, tingling, focal weakness, weakness and headaches.  Endo/Heme/Allergies:  Does not bruise/bleed easily.  Psychiatric/Behavioral:  Negative for depression. The patient is not nervous/anxious and does not have insomnia.    PAST MEDICAL HISTORY :  Past Medical History:  Diagnosis Date   Arthritis    Collagen vascular disease ([facility])    GERD (gastroesophageal reflux disease)    Headache(784.0)    History of methotrexate therapy    Hyperlipidemia    hx   Lymphadenopathy of head and neck 01/2015   see on Thyroid  ultrasound   Lymphoma, mantle cell ([place]) 06/01/2015   bx of lymph node in right breast/Stage IV Mantle Cell Lymphoma   Personal history of chemotherapy    Rheumatoid arthritis ([place])      PAST SURGICAL HISTORY :   Past Surgical History:  Procedure Laterality Date   BREAST BIOPSY Left 11/07/2020   u/s bx-hydromark #3 "coil"-path pending   CARDIAC CATHETERIZATION  09/2004   [facility]; EF 60%   CARDIAC CATHETERIZATION  08/2004   [facility]   IR FLUORO GUIDED NEEDLE PLC ASPIRATION/INJECTION LOC  06/18/2018   PERIPHERAL VASCULAR CATHETERIZATION N/A 07/04/2015   Procedure: [name] Insertion;  Surgeon: [name], MD;  Location: [facility];  Service: Cardiovascular;  Laterality: N/A;   PORTA CATH REMOVAL N/A 06/23/2018   Procedure: PORTA CATH REMOVAL;  Surgeon: [name], MD;  Location: [facility];  Service: Cardiovascular;  Laterality: N/A;   RIGHT/LEFT HEART CATH AND CORONARY ANGIOGRAPHY Bilateral 09/20/2019   Procedure: RIGHT/LEFT HEART CATH AND CORONARY ANGIOGRAPHY;  Surgeon: Nelva Bush, MD;  Location: [facility];  Service: Cardiovascular;  Laterality: Bilateral;    FAMILY HISTORY :   Family History  Problem Relation Age of Onset   Diabetes Mother    Cholelithiasis Mother    Hypertension Sister    Diabetes Sister    Heart murmur Sister    Arthritis Brother    Breast cancer Neg Hx     SOCIAL HISTORY:   Social History   Tobacco Use   Smoking status: Never   Smokeless tobacco: Never  Vaping Use   Vaping Use: Never used  Substance Use Topics   Alcohol use: No   Drug use: No    ALLERGIES:  has No Known Allergies.  MEDICATIONS:  Current Outpatient Medications  Medication Sig Dispense Refill   Acalabrutinib Maleate (CALQUENCE) 100 MG TABS Take 1 capsule (100 mg) by mouth in the morning and at bedtime. 60 tablet 2   acetaminophen (TYLENOL) 500 MG tablet Take 1,000 mg by mouth every 6 (six) hours as needed for mild pain.      albuterol (VENTOLIN HFA) 108 (90 Base) MCG/ACT inhaler TAKE 2 PUFFS BY MOUTH EVERY 6 HOURS AS NEEDED FOR WHEEZE OR SHORTNESS OF BREATH 18 each 1   carvedilol (COREG) 3.125 MG tablet TAKE 1 TABLET BY MOUTH  TWICE A DAY 180 tablet 0   cyanocobalamin 2000 MCG tablet Take 2,500 mcg by mouth daily.      fluticasone (FLONASE) 50 MCG/ACT nasal spray Place 2 sprays into both nostrils daily as needed for allergies. 16 g 4   furosemide (LASIX) 40 MG tablet TAKE 1 TABLET BY MOUTH EVERY DAY 90 tablet 0   losartan (COZAAR) 25 MG tablet TAKE 1 TABLET BY MOUTH EVERY DAY 90 tablet 1   MEGARED OMEGA-3 KRILL OIL PO Take 1 tablet by mouth daily.     meloxicam (MOBIC) 7.5 MG tablet Take 1 tablet (7.5 mg total) by mouth daily as needed for pain. 30 tablet 1   Multiple Vitamins-Minerals (CENTRUM SILVER 50+WOMEN) TABS Take by mouth.     Multiple Vitamins-Minerals (HAIR SKIN AND NAILS FORMULA PO) Take by mouth.     rosuvastatin (CRESTOR) 5 MG tablet Take 1 tablet (5 mg total) by mouth daily. 90 tablet 3   spironolactone (ALDACTONE) 25 MG tablet Take 0.5 tablets (12.5 mg total) by mouth daily. 45 tablet 3   No current facility-administered medications for this visit.   Facility-Administered Medications Ordered in Other  Visits  Medication Dose Route Frequency Provider Last Rate Last Admin   dexamethasone (DECADRON) 20 mg in sodium chloride 0.9 % 50 mL IVPB  20 mg Intravenous Once Charlaine Dalton R, MD       heparin lock flush 100 unit/mL  500 Units Intravenous Once Cammie Sickle, MD       riTUXimab-pvvr (RUXIENCE) 900 mg in sodium chloride 0.9 % 160 mL infusion  375 mg/m2 (Treatment Plan Recorded) Intravenous Once Cammie Sickle, MD       sodium chloride flush (NS) 0.9 % injection 10 mL  10 mL Intravenous Once Charlaine Dalton R, MD       sodium chloride flush (NS) 0.9 % injection 10 mL  10 mL Intravenous Once Cammie Sickle, MD       Tbo-Filgrastim [facility]) injection 480 mcg  480 mcg Subcutaneous Once Cammie Sickle, MD        PHYSICAL EXAMINATION: ECOG PERFORMANCE STATUS: 0 - Asymptomatic  BP 112/63   Pulse 63   Temp (!) 96.2 F (35.7 C) (Tympanic)   Resp 18   Wt 252 lb 3.2  oz (114.4 kg)   SpO2 100%   BMI 39.49 kg/m   Filed Weights   03/22/21 0831  Weight: 252 lb 3.2 oz (114.4 kg)    Physical Exam Constitutional:      Comments: Obese.  Walking herself with cane.  Accompanied by her sister.  HENT:     Head: Normocephalic and atraumatic.     Mouth/Throat:     Pharynx: No oropharyngeal exudate.  Eyes:     Pupils: Pupils are equal, round, and reactive to light.  Cardiovascular:     Rate and Rhythm: Normal rate and regular rhythm.  Pulmonary:     Effort: No respiratory distress.     Breath sounds: No wheezing.  Abdominal:     General: Bowel sounds are normal. There is no distension.     Palpations: Abdomen is soft. There is no mass.     Tenderness: There is no abdominal tenderness. There is no guarding or rebound.  Musculoskeletal:        General: No tenderness. Normal range of motion.     Cervical back: Normal range of motion and neck supple.  Skin:    General: Skin is warm.     Comments: Resolution of the 4 to 5 cm soft mass noted on the left posterior shoulder.  And also resolution of the left axillary lymphadenopathy.  Neurological:     Mental Status: She is alert and oriented to person, place, and time.  Psychiatric:        Mood and Affect: Affect normal.   LABORATORY DATA:  I have reviewed the data as listed    Component Value Date/Time   NA 135 03/22/2021 0815   NA 145 (H) 09/08/2019 1359   NA 142 09/14/2013 1127   K 4.0 03/22/2021 0815   K 3.5 09/14/2013 1127   CL 100 03/22/2021 0815   CL 110 (H) 09/14/2013 1127   CO2 30 03/22/2021 0815   CO2 29 09/14/2013 1127   GLUCOSE 103 (H) 03/22/2021 0815   GLUCOSE 106 (H) 09/14/2013 1127   BUN 24 (H) 03/22/2021 0815   BUN 15 09/08/2019 1359   BUN 8 09/14/2013 1127   CREATININE 0.79 03/22/2021 0815   CREATININE 0.71 09/14/2013 1127   CREATININE 0.68 04/01/2013 1550   CALCIUM 8.7 (L) 03/22/2021 0815   CALCIUM 8.6 09/14/2013 1127   PROT 6.5 02/22/2021  0802   PROT 7.6 09/14/2013 1127    ALBUMIN 3.7 02/22/2021 0802   ALBUMIN 3.2 (L) 09/14/2013 1127   AST 21 02/22/2021 0802   AST 22 09/14/2013 1127   ALT 12 02/22/2021 0802   ALT 19 09/14/2013 1127   ALKPHOS 67 02/22/2021 0802   ALKPHOS 76 09/14/2013 1127   BILITOT 0.6 02/22/2021 0802   BILITOT 0.4 09/14/2013 1127   GFRNONAA >60 03/22/2021 0815   GFRNONAA >60 09/14/2013 1127   GFRAA >60 01/20/2020 1303   GFRAA >60 09/14/2013 1127    No results found for: SPEP, UPEP  Lab Results  Component Value Date   WBC 8.4 03/22/2021   NEUTROABS 6.4 03/22/2021   HGB 13.1 03/22/2021   HCT 40.3 03/22/2021   MCV 93.9 03/22/2021   PLT 186 03/22/2021      Chemistry      Component Value Date/Time   NA 135 03/22/2021 0815   NA 145 (H) 09/08/2019 1359   NA 142 09/14/2013 1127   K 4.0 03/22/2021 0815   K 3.5 09/14/2013 1127   CL 100 03/22/2021 0815   CL 110 (H) 09/14/2013 1127   CO2 30 03/22/2021 0815   CO2 29 09/14/2013 1127   BUN 24 (H) 03/22/2021 0815   BUN 15 09/08/2019 1359   BUN 8 09/14/2013 1127   CREATININE 0.79 03/22/2021 0815   CREATININE 0.71 09/14/2013 1127   CREATININE 0.68 04/01/2013 1550      Component Value Date/Time   CALCIUM 8.7 (L) 03/22/2021 0815   CALCIUM 8.6 09/14/2013 1127   ALKPHOS 67 02/22/2021 0802   ALKPHOS 76 09/14/2013 1127   AST 21 02/22/2021 0802   AST 22 09/14/2013 1127   ALT 12 02/22/2021 0802   ALT 19 09/14/2013 1127   BILITOT 0.6 02/22/2021 0802   BILITOT 0.4 09/14/2013 1127       RADIOGRAPHIC STUDIES: I have personally reviewed the radiological images as listed and agreed with the findings in the report. No results found.   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 STABLE.    # Currently on Calequence 100 mg twice a day x AUG 1st.  Proceed with weekly rituximab #4 today of planned 4 and then monthly.  Tolerating treatment extremely well.  Moving forward we will plan starting treatments every month.   # Left shoulder pain/ Chronic arthritis-NSAIDs as needed- STABLE.   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   # Dizzy spells: cut down lasix to 20 mg/day.   # Pulmonary hypertension/chronic bilateral lung scarring- [s/p pulmonary evaluation]-improved on albuterol-  STABLE.   # Constipation  S/p mirlax; recommend use dulcolax.   # DISPOSITION: fridays pt pref # Rituaxn today # follow up-MD in 4 weeks [ MD ;labs- cbc/cmp/ldh/BNP -Rituxan IV.Dr.B        Orders Placed This Encounter  Procedures   CBC with Differential    Standing Status:   Future    Standing Expiration Date:   03/22/2022   Comprehensive metabolic panel    Standing Status:   Future    Standing Expiration Date:   03/22/2022   Lactate dehydrogenase    Standing  Status:   Future    Standing Expiration Date:   03/22/2022   Brain natriuretic peptide    Standing Status:   Future    Standing Expiration Date:   03/22/2022    All questions were answered. The patient knows to call the clinic with any problems, questions or concerns.      Cammie Sickle, MD 03/22/2021 9:39 AM

## 2021-03-22 NOTE — Assessment & Plan Note (Signed)
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 STABLE.    # Currently on Calequence 100 mg twice a day x AUG 1st.  Proceed with weekly rituximab #4 today of planned 4 and then monthly.  Tolerating treatment extremely well.  Moving forward we will plan starting treatments every month.   # Left shoulder pain/ Chronic arthritis-NSAIDs as needed- STABLE.   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   # Dizzy spells: cut down lasix to 20 mg/day.   # Pulmonary hypertension/chronic bilateral lung scarring- [s/p pulmonary evaluation]-improved on albuterol-  STABLE.   # Constipation  S/p mirlax; recommend use dulcolax.   # DISPOSITION: fridays pt pref # Rituaxn today # follow up-MD in 4 weeks [ MD ;labs- cbc/cmp/ldh/BNP -Rituxan IV.Dr.B

## 2021-03-24 ENCOUNTER — Other Ambulatory Visit: Payer: Self-pay | Admitting: Internal Medicine

## 2021-04-13 ENCOUNTER — Other Ambulatory Visit: Payer: Self-pay | Admitting: Internal Medicine

## 2021-04-16 ENCOUNTER — Encounter: Payer: Self-pay | Admitting: Internal Medicine

## 2021-04-16 ENCOUNTER — Other Ambulatory Visit (HOSPITAL_COMMUNITY): Payer: Self-pay

## 2021-04-18 ENCOUNTER — Other Ambulatory Visit (HOSPITAL_COMMUNITY): Payer: Self-pay

## 2021-04-19 ENCOUNTER — Inpatient Hospital Stay: Payer: Medicare HMO

## 2021-04-19 ENCOUNTER — Encounter: Payer: Self-pay | Admitting: Internal Medicine

## 2021-04-19 ENCOUNTER — Other Ambulatory Visit: Payer: Self-pay

## 2021-04-19 ENCOUNTER — Inpatient Hospital Stay: Payer: Medicare HMO | Attending: Internal Medicine | Admitting: Internal Medicine

## 2021-04-19 VITALS — BP 104/64 | HR 58 | Temp 96.5°F | Resp 17

## 2021-04-19 DIAGNOSIS — I272 Pulmonary hypertension, unspecified: Secondary | ICD-10-CM | POA: Insufficient documentation

## 2021-04-19 DIAGNOSIS — C8311 Mantle cell lymphoma, lymph nodes of head, face, and neck: Secondary | ICD-10-CM

## 2021-04-19 DIAGNOSIS — I428 Other cardiomyopathies: Secondary | ICD-10-CM | POA: Insufficient documentation

## 2021-04-19 DIAGNOSIS — Z5112 Encounter for antineoplastic immunotherapy: Secondary | ICD-10-CM | POA: Insufficient documentation

## 2021-04-19 LAB — CBC WITH DIFFERENTIAL/PLATELET
Abs Immature Granulocytes: 0.03 10*3/uL (ref 0.00–0.07)
Basophils Absolute: 0 10*3/uL (ref 0.0–0.1)
Basophils Relative: 0 %
Eosinophils Absolute: 0.1 10*3/uL (ref 0.0–0.5)
Eosinophils Relative: 2 %
HCT: 41.8 % (ref 36.0–46.0)
Hemoglobin: 13.5 g/dL (ref 12.0–15.0)
Immature Granulocytes: 0 %
Lymphocytes Relative: 16 %
Lymphs Abs: 1.1 10*3/uL (ref 0.7–4.0)
MCH: 30.4 pg (ref 26.0–34.0)
MCHC: 32.3 g/dL (ref 30.0–36.0)
MCV: 94.1 fL (ref 80.0–100.0)
Monocytes Absolute: 0.5 10*3/uL (ref 0.1–1.0)
Monocytes Relative: 7 %
Neutro Abs: 5.2 10*3/uL (ref 1.7–7.7)
Neutrophils Relative %: 75 %
Platelets: 213 10*3/uL (ref 150–400)
RBC: 4.44 MIL/uL (ref 3.87–5.11)
RDW: 14.1 % (ref 11.5–15.5)
WBC: 7 10*3/uL (ref 4.0–10.5)
nRBC: 0 % (ref 0.0–0.2)

## 2021-04-19 LAB — COMPREHENSIVE METABOLIC PANEL
ALT: 13 U/L (ref 0–44)
AST: 20 U/L (ref 15–41)
Albumin: 3.7 g/dL (ref 3.5–5.0)
Alkaline Phosphatase: 68 U/L (ref 38–126)
Anion gap: 9 (ref 5–15)
BUN: 19 mg/dL (ref 8–23)
CO2: 29 mmol/L (ref 22–32)
Calcium: 8.9 mg/dL (ref 8.9–10.3)
Chloride: 100 mmol/L (ref 98–111)
Creatinine, Ser: 0.8 mg/dL (ref 0.44–1.00)
GFR, Estimated: >60 mL/min (ref >60)
Glucose, Bld: 106 mg/dL — ABNORMAL HIGH (ref 70–99)
Potassium: 4.5 mmol/L (ref 3.5–5.1)
Sodium: 138 mmol/L (ref 135–145)
Total Bilirubin: 0.7 mg/dL (ref 0.3–1.2)
Total Protein: 6.7 g/dL (ref 6.5–8.1)

## 2021-04-19 LAB — LACTATE DEHYDROGENASE: LDH: 167 U/L (ref 98–192)

## 2021-04-19 LAB — BRAIN NATRIURETIC PEPTIDE: B Natriuretic Peptide: 37.8 pg/mL (ref 0.0–100.0)

## 2021-04-19 MED ORDER — SODIUM CHLORIDE 0.9 % IV SOLN
Freq: Once | INTRAVENOUS | Status: AC
  Filled 2021-04-19: qty 250

## 2021-04-19 MED ORDER — DIPHENHYDRAMINE HCL 25 MG PO CAPS
50.0000 mg | ORAL_CAPSULE | Freq: Once | ORAL | Status: AC
  Administered 2021-04-19: 50 mg via ORAL
  Filled 2021-04-19: qty 2

## 2021-04-19 MED ORDER — ACETAMINOPHEN 325 MG PO TABS
650.0000 mg | ORAL_TABLET | Freq: Once | ORAL | Status: AC
  Administered 2021-04-19: 650 mg via ORAL
  Filled 2021-04-19: qty 2

## 2021-04-19 MED ORDER — SODIUM CHLORIDE 0.9 % IV SOLN
375.0000 mg/m2 | Freq: Once | INTRAVENOUS | Status: AC
  Administered 2021-04-19: 900 mg via INTRAVENOUS
  Filled 2021-04-19: qty 50

## 2021-04-19 NOTE — Progress Notes (Signed)
Patient here for oncology follow-up appointment,  concerns  of constipation, SOB and hand neuropathy

## 2021-04-19 NOTE — Patient Instructions (Signed)
#   recommend taking Dulcolax once or twice a day-to help move your bowels.

## 2021-04-19 NOTE — Progress Notes (Signed)
[facility] OFFICE PROGRESS NOTE  Patient Care Team: Burnard Hawthorne, FNP as PCP - General (Family Medicine) End, Harrell Gave, MD as PCP - Cardiology (Cardiology) Deboraha Sprang, MD as PCP - Electrophysiology (Cardiology) Jackolyn Confer, MD (Internal Medicine) Cammie Sickle, MD as Consulting Physician (Internal Medicine)   Cancer Staging  No matching staging information was found for the patient.   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 Feb 9th- START Benda-Ritux with neulasta; Prolonged neutropenia; DISCONT- Benda-Ritux;   # April 13 th 2017- START R-CHOP x1; severe/prolonged neutropenia; PET- CR; BMBx-Neg; Disc R-CHOP  # 26th MAY 2017- Start Rituxan q 59M Main OCT 12th 2017- PET NED.  Stop Rituxan maintenance x 2 years [aug 2019-pneumonia]  # June 2022- DIAGNOSIS: thickened cortex of 12 mm. An additional lymph node demonstrates a thickened cortex of 7 mm. A third lymph node demonstrates a mildly thickened cortex of 4 mm A. LYMPH NODE, LEFT AXILLA; ULTRASOUND-GUIDED BIOPSY:  - CD5+ MONOCLONAL B-CELL POPULATION; COMPATIBLE WITH INVOLVEMENT BY THE  PATIENT'S KNOWN MANTLE CELL LYMPHOMA.   #July 2022-second week-started ibrutinib 420 mg a dayx 1 week-stop because of severe rash.  Significant clinical response noted.  # AUG 1st, 2022- start acalbrutinib. 9/23-2022-rituximab weekly.  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   ZUZANNA MARONEY 80 y.o.  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  Patient currently on weekly rituximab status post 4 treatments.  Complains of mild constipation.  Not improved with MiraLAX.  Patient has not started Dulcolax.  Denies any worsening headaches.  Continues to be compliant with her Calquence.  Review of Systems  Constitutional:  Negative for chills, diaphoresis, fever, malaise/fatigue and weight loss.  HENT:  Negative for nosebleeds and sore throat.   Eyes:  Negative for double vision.  Respiratory:  Negative for hemoptysis, sputum production and wheezing.   Cardiovascular:  Negative for chest pain, palpitations, orthopnea and leg swelling.  Gastrointestinal:  Positive for constipation. Negative for abdominal pain, blood in stool, diarrhea, heartburn, melena, nausea and vomiting.  Genitourinary:  Negative for dysuria, frequency and urgency.  Musculoskeletal:  Positive for back pain and joint pain.  Skin: Negative.  Negative for itching and rash.  Neurological:  Negative for dizziness, tingling, focal weakness, weakness and headaches.  Endo/Heme/Allergies:  Does not bruise/bleed easily.  Psychiatric/Behavioral:  Negative for depression. The patient is not nervous/anxious and does not have insomnia.    PAST MEDICAL HISTORY :  Past Medical History:  Diagnosis Date   Arthritis    Collagen vascular disease ([place])    GERD (gastroesophageal reflux disease)    Headache(784.0)    History of methotrexate therapy    Hyperlipidemia    hx   Lymphadenopathy of head and neck 01/2015   see on Thyroid ultrasound   Lymphoma, mantle  cell ([place]) 06/01/2015   bx of lymph node in right breast/Stage IV Mantle Cell Lymphoma   Personal history of chemotherapy    Rheumatoid arthritis [facility])     PAST SURGICAL HISTORY  :   Past Surgical History:  Procedure Laterality Date   BREAST BIOPSY Left 11/07/2020   u/s bx-hydromark #3 "coil"-path pending   CARDIAC CATHETERIZATION  09/2004   [facility]; EF 60%   CARDIAC CATHETERIZATION  08/2004   [facility]   IR FLUORO GUIDED NEEDLE PLC ASPIRATION/INJECTION LOC  06/18/2018   PERIPHERAL VASCULAR CATHETERIZATION N/A 07/04/2015   Procedure: [name] Insertion;  Surgeon: [name], MD;  Location: [facility];  Service: Cardiovascular;  Laterality: N/A;   PORTA CATH REMOVAL N/A 06/23/2018   Procedure: PORTA CATH REMOVAL;  Surgeon: [name], MD;  Location: [facility];  Service: Cardiovascular;  Laterality: N/A;   RIGHT/LEFT HEART CATH AND CORONARY ANGIOGRAPHY Bilateral 09/20/2019   Procedure: RIGHT/LEFT HEART CATH AND CORONARY ANGIOGRAPHY;  Surgeon: Nelva Bush, MD;  Location: [facility];  Service: Cardiovascular;  Laterality: Bilateral;    FAMILY HISTORY :   Family History  Problem Relation Age of Onset   Diabetes Mother    Cholelithiasis Mother    Hypertension Sister    Diabetes Sister    Heart murmur Sister    Arthritis Brother    Breast cancer Neg Hx     SOCIAL HISTORY:   Social History   Tobacco Use   Smoking status: Never   Smokeless tobacco: Never  Vaping Use   Vaping Use: Never used  Substance Use Topics   Alcohol use: No   Drug use: No    ALLERGIES:  has No Known Allergies.  MEDICATIONS:  Current Outpatient Medications  Medication Sig Dispense Refill   Acalabrutinib Maleate (CALQUENCE) 100 MG TABS Take 1 capsule (100 mg) by mouth in the morning and at bedtime. 60 tablet 2   acetaminophen (TYLENOL) 500 MG tablet Take 1,000 mg by mouth every 6 (six) hours as needed for mild pain.      albuterol (VENTOLIN HFA) 108 (90 Base) MCG/ACT inhaler TAKE 2 PUFFS BY MOUTH EVERY 6 HOURS AS NEEDED FOR WHEEZE OR SHORTNESS OF BREATH 18 each 1   carvedilol (COREG) 3.125 MG tablet TAKE 1 TABLET BY MOUTH TWICE A DAY 180 tablet 0    cyanocobalamin 2000 MCG tablet Take 2,500 mcg by mouth daily.      fluticasone (FLONASE) 50 MCG/ACT nasal spray Place 2 sprays into both nostrils daily as needed for allergies. 16 g 4   furosemide (LASIX) 40 MG tablet TAKE 1 TABLET BY MOUTH EVERY DAY 90 tablet 0   losartan (COZAAR) 25 MG tablet TAKE 1 TABLET BY MOUTH EVERY DAY 90 tablet 1   MEGARED OMEGA-3 KRILL OIL PO Take 1 tablet by mouth daily.     meloxicam (MOBIC) 7.5 MG tablet TAKE 1 TABLET BY MOUTH DAILY AS NEEDED FOR PAIN 30 tablet 1   Multiple Vitamins-Minerals (CENTRUM SILVER 50+WOMEN) TABS Take by mouth.     Multiple Vitamins-Minerals (HAIR SKIN AND NAILS FORMULA PO) Take by mouth.     rosuvastatin (CRESTOR) 5 MG tablet Take 1 tablet (5 mg total) by mouth daily. 90 tablet 3   spironolactone (ALDACTONE) 25 MG tablet Take 0.5 tablets (12.5 mg total) by mouth daily. 45 tablet 3   No current facility-administered medications for this visit.   Facility-Administered Medications Ordered in Other Visits  Medication Dose Route Frequency Provider Last  Rate Last Admin   heparin lock flush 100 unit/mL  500 Units Intravenous Once Cammie Sickle, MD       riTUXimab-pvvr (RUXIENCE) 900 mg in sodium chloride 0.9 % 160 mL infusion  375 mg/m2 (Treatment Plan Recorded) Intravenous Once Cammie Sickle, MD       sodium chloride flush (NS) 0.9 % injection 10 mL  10 mL Intravenous Once Charlaine Dalton R, MD       sodium chloride flush (NS) 0.9 % injection 10 mL  10 mL Intravenous Once Cammie Sickle, MD       Tbo-Filgrastim [facility]) injection 480 mcg  480 mcg Subcutaneous Once Cammie Sickle, MD        PHYSICAL EXAMINATION: ECOG PERFORMANCE STATUS: 0 - Asymptomatic  BP 106/66 (BP Location: Left Arm, Patient Position: Sitting)   Pulse 62   Temp (!) 96.7 F (35.9 C)   Resp 17   Wt 251 lb (113.9 kg)   SpO2 100%   BMI 39.30 kg/m   Filed Weights   04/19/21 0829  Weight: 251 lb (113.9 kg)    Physical Exam HENT:      Head: Normocephalic and atraumatic.     Mouth/Throat:     Pharynx: No oropharyngeal exudate.  Eyes:     Pupils: Pupils are equal, round, and reactive to light.  Cardiovascular:     Rate and Rhythm: Normal rate and regular rhythm.  Pulmonary:     Effort: No respiratory distress.     Breath sounds: No wheezing.  Abdominal:     General: Bowel sounds are normal. There is no distension.     Palpations: Abdomen is soft. There is no mass.     Tenderness: There is no abdominal tenderness. There is no guarding or rebound.  Musculoskeletal:        General: No tenderness. Normal range of motion.     Cervical back: Normal range of motion and neck supple.  Skin:    General: Skin is warm.  Neurological:     Mental Status: She is alert and oriented to person, place, and time.  Psychiatric:        Mood and Affect: Affect normal.   LABORATORY DATA:  I have reviewed the data as listed    Component Value Date/Time   NA 138 04/19/2021 0752   NA 145 (H) 09/08/2019 1359   NA 142 09/14/2013 1127   K 4.5 04/19/2021 0752   K 3.5 09/14/2013 1127   CL 100 04/19/2021 0752   CL 110 (H) 09/14/2013 1127   CO2 29 04/19/2021 0752   CO2 29 09/14/2013 1127   GLUCOSE 106 (H) 04/19/2021 0752   GLUCOSE 106 (H) 09/14/2013 1127   BUN 19 04/19/2021 0752   BUN 15 09/08/2019 1359   BUN 8 09/14/2013 1127   CREATININE 0.80 04/19/2021 0752   CREATININE 0.71 09/14/2013 1127   CREATININE 0.68 04/01/2013 1550   CALCIUM 8.9 04/19/2021 0752   CALCIUM 8.6 09/14/2013 1127   PROT 6.7 04/19/2021 0752   PROT 7.6 09/14/2013 1127   ALBUMIN 3.7 04/19/2021 0752   ALBUMIN 3.2 (L) 09/14/2013 1127   AST 20 04/19/2021 0752   AST 22 09/14/2013 1127   ALT 13 04/19/2021 0752   ALT 19 09/14/2013 1127   ALKPHOS 68 04/19/2021 0752   ALKPHOS 76 09/14/2013 1127   BILITOT 0.7 04/19/2021 0752   BILITOT 0.4 09/14/2013 1127   GFRNONAA >60 04/19/2021 0752   GFRNONAA >60 09/14/2013 1127   GFRAA >  60 01/20/2020 1303   GFRAA >60  09/14/2013 1127    No results found for: SPEP, UPEP  Lab Results  Component Value Date   WBC 7.0 04/19/2021   NEUTROABS 5.2 04/19/2021   HGB 13.5 04/19/2021   HCT 41.8 04/19/2021   MCV 94.1 04/19/2021   PLT 213 04/19/2021      Chemistry      Component Value Date/Time   NA 138 04/19/2021 0752   NA 145 (H) 09/08/2019 1359   NA 142 09/14/2013 1127   K 4.5 04/19/2021 0752   K 3.5 09/14/2013 1127   CL 100 04/19/2021 0752   CL 110 (H) 09/14/2013 1127   CO2 29 04/19/2021 0752   CO2 29 09/14/2013 1127   BUN 19 04/19/2021 0752   BUN 15 09/08/2019 1359   BUN 8 09/14/2013 1127   CREATININE 0.80 04/19/2021 0752   CREATININE 0.71 09/14/2013 1127   CREATININE 0.68 04/01/2013 1550      Component Value Date/Time   CALCIUM 8.9 04/19/2021 0752   CALCIUM 8.6 09/14/2013 1127   ALKPHOS 68 04/19/2021 0752   ALKPHOS 76 09/14/2013 1127   AST 20 04/19/2021 0752   AST 22 09/14/2013 1127   ALT 13 04/19/2021 0752   ALT 19 09/14/2013 1127   BILITOT 0.7 04/19/2021 0752   BILITOT 0.4 09/14/2013 1127       RADIOGRAPHIC STUDIES: I have personally reviewed the radiological images as listed and agreed with the findings in the report. No results found.   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 STABLE.  Currently on Calquence and rituximab- monthly x 8 and then every 2 months x 2  Years.   # Currently on Calequence 100 mg twice a day x AUG 1st, 2022.  Proceed with #1 Rituximab monthly.  Tolerating treatment extremely well. Will plan scans in feb, 2023.   # Left shoulder pain/ Chronic arthritis-on meloxicam as needed- STABLE.   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   # Constipation: Worse S/p mirlax; recommend use dulcolax.   # vaccination: s/p Flu shots; COVID shots.   # DISPOSITION: fridays pt pref # Rituaxn today # follow up-MD in 4 weeks [ MD ;labs- cbc/cmp/ldh/BNP -Rituxan IV.Dr.B        Orders Placed This Encounter  Procedures   CBC with Differential/Platelet    Standing Status:   Future    Standing Expiration Date:   04/19/2022   Comprehensive metabolic panel    Standing Status:   Future    Standing Expiration Date:   04/19/2022   Lactate dehydrogenase    Standing Status:   Future    Standing Expiration Date:   04/19/2022   Brain natriuretic peptide    Standing Status:   Future    Standing Expiration Date:   04/19/2022     All questions were answered. The patient knows to call the clinic with any problems, questions or concerns.      Cammie Sickle, MD 04/19/2021 9:49 AM

## 2021-04-19 NOTE — Assessment & Plan Note (Addendum)
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 STABLE.  Currently on Calquence and rituximab- monthly x 8 and then every 2 months x 2  Years.   # Currently on Calequence 100 mg twice a day x AUG 1st, 2022.  Proceed with #1 Rituximab monthly.  Tolerating treatment extremely well. Will plan scans in feb, 2023.   # Left shoulder pain/ Chronic arthritis-on meloxicam as needed- STABLE.   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   # Constipation: Worse S/p mirlax; recommend use dulcolax.   # vaccination: s/p Flu shots; COVID shots.   # DISPOSITION: fridays pt pref # Rituaxn today # follow up-MD in 4 weeks [ MD ;labs- cbc/cmp/ldh/BNP -Rituxan IV.Dr.B

## 2021-04-19 NOTE — Patient Instructions (Signed)
`  [facility] ONCOLOGY  Discharge Instructions: Thank you for choosing [facility] to provide your oncology and hematology care.  If you have a lab appointment with the [facility], please go directly to the [facility] and check in at the registration area.  Wear comfortable clothing and clothing appropriate for easy access to any Portacath or PICC line.   We strive to give you quality time with your provider. You may need to reschedule your appointment if you arrive late (15 or more minutes).  Arriving late affects you and other patients whose appointments are after yours.  Also, if you miss three or more appointments without notifying the office, you may be dismissed from the clinic at the provider's discretion.      For prescription refill requests, have your pharmacy contact our office and allow 72 hours for refills to be completed.    Today you received the following chemotherapy and/or immunotherapy agents Ruxience   To help prevent nausea and vomiting after your treatment, we encourage you to take your nausea medication as directed.  BELOW ARE SYMPTOMS THAT SHOULD BE REPORTED IMMEDIATELY: *FEVER GREATER THAN 100.4 F (38 C) OR HIGHER *CHILLS OR SWEATING *NAUSEA AND VOMITING THAT IS NOT CONTROLLED WITH YOUR NAUSEA MEDICATION *UNUSUAL SHORTNESS OF BREATH *UNUSUAL BRUISING OR BLEEDING *URINARY PROBLEMS (pain or burning when urinating, or frequent urination) *BOWEL PROBLEMS (unusual diarrhea, constipation, pain near the anus) TENDERNESS IN MOUTH AND THROAT WITH OR WITHOUT PRESENCE OF ULCERS (sore throat, sores in mouth, or a toothache) UNUSUAL RASH, SWELLING OR PAIN  UNUSUAL VAGINAL DISCHARGE OR ITCHING   Items with * indicate a potential emergency and should be followed up as soon as possible or go to the Emergency Department if any problems should occur.  Please show the CHEMOTHERAPY ALERT CARD or IMMUNOTHERAPY ALERT CARD at check-in to  the Emergency Department and triage nurse.  Should you have questions after your visit or need to cancel or reschedule your appointment, please contact [facility]  [phone] and follow the prompts.  Office hours are 8:00 a.m. to 4:30 p.m. Monday - Friday. Please note that voicemails left after 4:00 p.m. may not be returned until the following business day.  We are closed weekends and major holidays. You have access to a nurse at all times for urgent questions. Please call the main number to the clinic [phone] and follow the prompts.  For any non-urgent questions, you may also contact your provider using MyChart. We now offer e-Visits for anyone 74 and older to request care online for non-urgent symptoms. For details visit [email or web address].   Also download the MyChart app! Go to the app store, search "MyChart", open the app, select [facility], and log in with your MyChart username and password.  Due to Covid, a mask is required upon entering the hospital/clinic. If you do not have a mask, one will be given to you upon arrival. For doctor visits, patients may have 1 support person aged 85 or older with them. For treatment visits, patients cannot have anyone with them due to current Covid guidelines and our immunocompromised population.

## 2021-04-29 ENCOUNTER — Other Ambulatory Visit: Payer: Self-pay | Admitting: *Deleted

## 2021-04-29 ENCOUNTER — Other Ambulatory Visit (HOSPITAL_COMMUNITY): Payer: Self-pay

## 2021-04-29 DIAGNOSIS — C8311 Mantle cell lymphoma, lymph nodes of head, face, and neck: Secondary | ICD-10-CM

## 2021-04-29 MED ORDER — CALQUENCE 100 MG PO TABS
100.0000 mg | ORAL_TABLET | Freq: Two times a day (BID) | ORAL | 2 refills | Status: DC
  Filled 2021-04-29 – 2021-04-30 (×2): qty 60, 30d supply, fill #0
  Filled 2021-05-21: qty 60, 30d supply, fill #1
  Filled 2021-06-18: qty 60, 30d supply, fill #2

## 2021-04-30 ENCOUNTER — Other Ambulatory Visit (HOSPITAL_COMMUNITY): Payer: Self-pay

## 2021-05-08 ENCOUNTER — Other Ambulatory Visit: Payer: Self-pay | Admitting: Internal Medicine

## 2021-05-09 ENCOUNTER — Other Ambulatory Visit: Payer: Self-pay | Admitting: Physician Assistant

## 2021-05-16 ENCOUNTER — Encounter: Payer: Self-pay | Admitting: Internal Medicine

## 2021-05-16 ENCOUNTER — Other Ambulatory Visit (HOSPITAL_COMMUNITY): Payer: Self-pay

## 2021-05-17 ENCOUNTER — Encounter: Payer: Self-pay | Admitting: Oncology

## 2021-05-17 ENCOUNTER — Other Ambulatory Visit: Payer: Self-pay

## 2021-05-17 ENCOUNTER — Inpatient Hospital Stay: Payer: Medicare HMO | Admitting: Oncology

## 2021-05-17 ENCOUNTER — Inpatient Hospital Stay: Payer: Medicare HMO

## 2021-05-17 ENCOUNTER — Inpatient Hospital Stay: Payer: Medicare HMO | Attending: Internal Medicine

## 2021-05-17 VITALS — BP 124/84 | HR 62 | Temp 96.7°F | Wt 245.6 lb

## 2021-05-17 DIAGNOSIS — Z5112 Encounter for antineoplastic immunotherapy: Secondary | ICD-10-CM | POA: Diagnosis not present

## 2021-05-17 DIAGNOSIS — I428 Other cardiomyopathies: Secondary | ICD-10-CM | POA: Insufficient documentation

## 2021-05-17 DIAGNOSIS — C8311 Mantle cell lymphoma, lymph nodes of head, face, and neck: Secondary | ICD-10-CM | POA: Diagnosis not present

## 2021-05-17 DIAGNOSIS — Z5111 Encounter for antineoplastic chemotherapy: Secondary | ICD-10-CM

## 2021-05-17 LAB — COMPREHENSIVE METABOLIC PANEL
ALT: 12 U/L (ref 0–44)
AST: 20 U/L (ref 15–41)
Albumin: 3.7 g/dL (ref 3.5–5.0)
Alkaline Phosphatase: 67 U/L (ref 38–126)
Anion gap: 5 (ref 5–15)
BUN: 15 mg/dL (ref 8–23)
CO2: 26 mmol/L (ref 22–32)
Calcium: 8.7 mg/dL — ABNORMAL LOW (ref 8.9–10.3)
Chloride: 107 mmol/L (ref 98–111)
Creatinine, Ser: 0.78 mg/dL (ref 0.44–1.00)
GFR, Estimated: >60 mL/min (ref >60)
Glucose, Bld: 102 mg/dL — ABNORMAL HIGH (ref 70–99)
Potassium: 3.8 mmol/L (ref 3.5–5.1)
Sodium: 138 mmol/L (ref 135–145)
Total Bilirubin: 0.6 mg/dL (ref 0.3–1.2)
Total Protein: 6.8 g/dL (ref 6.5–8.1)

## 2021-05-17 LAB — CBC WITH DIFFERENTIAL/PLATELET
Abs Immature Granulocytes: 0.08 10*3/uL — ABNORMAL HIGH (ref 0.00–0.07)
Basophils Absolute: 0 10*3/uL (ref 0.0–0.1)
Basophils Relative: 1 %
Eosinophils Absolute: 0.1 10*3/uL (ref 0.0–0.5)
Eosinophils Relative: 1 %
HCT: 41.8 % (ref 36.0–46.0)
Hemoglobin: 13.5 g/dL (ref 12.0–15.0)
Immature Granulocytes: 1 %
Lymphocytes Relative: 18 %
Lymphs Abs: 1.2 10*3/uL (ref 0.7–4.0)
MCH: 30.1 pg (ref 26.0–34.0)
MCHC: 32.3 g/dL (ref 30.0–36.0)
MCV: 93.1 fL (ref 80.0–100.0)
Monocytes Absolute: 0.4 10*3/uL (ref 0.1–1.0)
Monocytes Relative: 7 %
Neutro Abs: 4.8 10*3/uL (ref 1.7–7.7)
Neutrophils Relative %: 72 %
Platelets: 204 10*3/uL (ref 150–400)
RBC: 4.49 MIL/uL (ref 3.87–5.11)
RDW: 13.8 % (ref 11.5–15.5)
WBC: 6.6 10*3/uL (ref 4.0–10.5)
nRBC: 0 % (ref 0.0–0.2)

## 2021-05-17 LAB — LACTATE DEHYDROGENASE: LDH: 157 U/L (ref 98–192)

## 2021-05-17 LAB — BRAIN NATRIURETIC PEPTIDE: B Natriuretic Peptide: 54.4 pg/mL (ref 0.0–100.0)

## 2021-05-17 MED ORDER — DIPHENHYDRAMINE HCL 25 MG PO CAPS
50.0000 mg | ORAL_CAPSULE | Freq: Once | ORAL | Status: AC
  Administered 2021-05-17: 50 mg via ORAL
  Filled 2021-05-17: qty 2

## 2021-05-17 MED ORDER — SODIUM CHLORIDE 0.9 % IV SOLN
Freq: Once | INTRAVENOUS | Status: AC
  Filled 2021-05-17: qty 250

## 2021-05-17 MED ORDER — SODIUM CHLORIDE 0.9 % IV SOLN
375.0000 mg/m2 | Freq: Once | INTRAVENOUS | Status: AC
  Administered 2021-05-17: 900 mg via INTRAVENOUS
  Filled 2021-05-17: qty 40

## 2021-05-17 MED ORDER — ACETAMINOPHEN 325 MG PO TABS
650.0000 mg | ORAL_TABLET | Freq: Once | ORAL | Status: AC
  Administered 2021-05-17: 650 mg via ORAL
  Filled 2021-05-17: qty 2

## 2021-05-17 NOTE — Patient Instructions (Signed)
[facility] AT [place]  Discharge Instructions: Thank you for choosing [facility] to provide your oncology and hematology care.  If you have a lab appointment with the [facility], please go directly to the [facility] and check in at the registration area.  Wear comfortable clothing and clothing appropriate for easy access to any Portacath or PICC line.   We strive to give you quality time with your provider. You may need to reschedule your appointment if you arrive late (15 or more minutes).  Arriving late affects you and other patients whose appointments are after yours.  Also, if you miss three or more appointments without notifying the office, you may be dismissed from the clinic at the providers discretion.      For prescription refill requests, have your pharmacy contact our office and allow 72 hours for refills to be completed.    Today you received the following chemotherapy and/or immunotherapy agents RUXIENCE      To help prevent nausea and vomiting after your treatment, we encourage you to take your nausea medication as directed.  BELOW ARE SYMPTOMS THAT SHOULD BE REPORTED IMMEDIATELY: *FEVER GREATER THAN 100.4 F (38 C) OR HIGHER *CHILLS OR SWEATING *NAUSEA AND VOMITING THAT IS NOT CONTROLLED WITH YOUR NAUSEA MEDICATION *UNUSUAL SHORTNESS OF BREATH *UNUSUAL BRUISING OR BLEEDING *URINARY PROBLEMS (pain or burning when urinating, or frequent urination) *BOWEL PROBLEMS (unusual diarrhea, constipation, pain near the anus) TENDERNESS IN MOUTH AND THROAT WITH OR WITHOUT PRESENCE OF ULCERS (sore throat, sores in mouth, or a toothache) UNUSUAL RASH, SWELLING OR PAIN  UNUSUAL VAGINAL DISCHARGE OR ITCHING   Items with * indicate a potential emergency and should be followed up as soon as possible or go to the Emergency Department if any problems should occur.  Please show the CHEMOTHERAPY ALERT CARD or IMMUNOTHERAPY ALERT CARD at check-in to  the Emergency Department and triage nurse.  Should you have questions after your visit or need to cancel or reschedule your appointment, please contact [facility] [place] AT [place]  [phone] and follow the prompts.  Office hours are 8:00 a.m. to 4:30 p.m. Monday - Friday. Please note that voicemails left after 4:00 p.m. may not be returned until the following business day.  We are closed weekends and major holidays. You have access to a nurse at all times for urgent questions. Please call the main number to the clinic [phone] and follow the prompts.  For any non-urgent questions, you may also contact your provider using MyChart. We now offer e-Visits for anyone 69 and older to request care online for non-urgent symptoms. For details visit [email or web address].   Also download the MyChart app! Go to the app store, search "MyChart", open the app, select [facility], and log in with your MyChart username and password.  Due to Covid, a mask is required upon entering the hospital/clinic. If you do not have a mask, one will be given to you upon arrival. For doctor visits, patients may have 1 support person aged 41 or older with them. For treatment visits, patients cannot have anyone with them due to current Covid guidelines and our immunocompromised population.   Rituximab Injection What is this medication? RITUXIMAB (ri TUX i mab) is a monoclonal antibody. It is used to treat certain types of cancer like non-Hodgkin lymphoma and chronic lymphocytic leukemia. It is also used to treat rheumatoid arthritis, granulomatosis with polyangiitis, microscopic polyangiitis, and pemphigus vulgaris. This medicine may be used for other purposes;  ask your health care provider or pharmacist if you have questions. COMMON BRAND NAME(S): RIABNI, Rituxan, RUXIENCE What should I tell my care team before I take this medication? They need to know if you have any of these conditions: chest  pain heart disease infection especially a viral infection such as chickenpox, cold sores, hepatitis B, or herpes immune system problems irregular heartbeat or rhythm kidney disease low blood counts (white cells, platelets, or red cells) lung disease recent or upcoming vaccine an unusual or allergic reaction to rituximab, other medicines, foods, dyes, or preservatives pregnant or trying to get pregnant breast-feeding How should I use this medication? This medicine is injected into a vein. It is given by a health care provider in a hospital or clinic setting. A special MedGuide will be given to you before each treatment. Be sure to read this information carefully each time. Talk to your health care provider about the use of this medicine in children. While this drug may be prescribed for children as young as 6 months for selected conditions, precautions do apply. Overdosage: If you think you have taken too much of this medicine contact a poison control center or emergency room at once. NOTE: This medicine is only for you. Do not share this medicine with others. What if I miss a dose? Keep appointments for follow-up doses. It is important not to miss your dose. Call your health care provider if you are unable to keep an appointment. What may interact with this medication? Do not take this medicine with any of the following medicines: live vaccines This medicine may also interact with the following medicines: cisplatin This list may not describe all possible interactions. Give your health care provider a list of all the medicines, herbs, non-prescription drugs, or dietary supplements you use. Also tell them if you smoke, drink alcohol, or use illegal drugs. Some items may interact with your medicine. What should I watch for while using this medication? Your condition will be monitored carefully while you are receiving this medicine. You may need blood work done while you are taking this  medicine. This medicine can cause serious infusion reactions. To reduce the risk your health care provider may give you other medicines to take before receiving this one. Be sure to follow the directions from your health care provider. This medicine may increase your risk of getting an infection. Call your health care provider for advice if you get a fever, chills, sore throat, or other symptoms of a cold or flu. Do not treat yourself. Try to avoid being around people who are sick. Call your health care provider if you are around anyone with measles, chickenpox, or if you develop sores or blisters that do not heal properly. Avoid taking medicines that contain aspirin, acetaminophen, ibuprofen, naproxen, or ketoprofen unless instructed by your health care provider. These medicines may hide a fever. This medicine may cause serious skin reactions. They can happen weeks to months after starting the medicine. Contact your health care provider right away if you notice fevers or flu-like symptoms with a rash. The rash may be red or purple and then turn into blisters or peeling of the skin. Or, you might notice a red rash with swelling of the face, lips or lymph nodes in your neck or under your arms. In some patients, this medicine may cause a serious brain infection that may cause death. If you have any problems seeing, thinking, speaking, walking, or standing, tell your healthcare professional right away. If you  cannot reach your healthcare professional, urgently seek other source of medical care. Do not become pregnant while taking this medicine or for at least 12 months after stopping it. Women should inform their health care provider if they wish to become pregnant or think they might be pregnant. There is potential for serious harm to an unborn child. Talk to your health care provider for more information. Women should use a reliable form of birth control while taking this medicine and for 12 months after  stopping it. Do not breast-feed while taking this medicine or for at least 6 months after stopping it. What side effects may I notice from receiving this medication? Side effects that you should report to your health care provider as soon as possible: allergic reactions (skin rash, itching or hives; swelling of the face, lips, or tongue) diarrhea edema (sudden weight gain; swelling of the ankles, feet, hands or other unusual swelling; trouble breathing) fast, irregular heartbeat heart attack (trouble breathing; pain or tightness in the chest, neck, back or arms; unusually weak or tired) infection (fever, chills, cough, sore throat, pain or trouble passing urine) kidney injury (trouble passing urine or change in the amount of urine) liver injury (dark yellow or brown urine; general ill feeling or flu-like symptoms; loss of appetite, right upper belly pain; unusually weak or tired, yellowing of the eyes or skin) low blood pressure (dizziness; feeling faint or lightheaded, falls; unusually weak or tired) low red blood cell counts (trouble breathing; feeling faint; lightheaded, falls; unusually weak or tired) mouth sores redness, blistering, peeling, or loosening of the skin, including inside the mouth stomach pain unusual bruising or bleeding wheezing (trouble breathing with loud or whistling sounds) vomiting Side effects that usually do not require medical attention (report to your health care provider if they continue or are bothersome): headache joint pain muscle cramps, pain nausea This list may not describe all possible side effects. Call your doctor for medical advice about side effects. You may report side effects to FDA at 1-800-FDA-1088. Where should I keep my medication? This medicine is given in a hospital or clinic. It will not be stored at home. NOTE: This sheet is a summary. It may not cover all possible information. If you have questions about this medicine, talk to your  doctor, pharmacist, or health care provider.  2022 Elsevier/Gold Standard (2020-05-21 00:00:00)

## 2021-05-17 NOTE — Progress Notes (Signed)
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 Feb 9th- START Benda-Ritux with neulasta; Prolonged neutropenia; DISCONT- Benda-Ritux;   # April 13 th 2017- START R-CHOP x1; severe/prolonged neutropenia; PET- CR; BMBx-Neg; Disc R-CHOP  # 26th MAY 2017- Start Rituxan q 62M Main OCT 12th 2017- PET NED.  Stop Rituxan maintenance x 2 years [aug 2019-pneumonia]  # June 2022- DIAGNOSIS: thickened cortex of 12 mm. An additional lymph node demonstrates a thickened cortex of 7 mm. A third lymph node demonstrates a mildly thickened cortex of 4 mm A. LYMPH NODE, LEFT AXILLA; ULTRASOUND-GUIDED BIOPSY:  - CD5+ MONOCLONAL B-CELL POPULATION; COMPATIBLE WITH INVOLVEMENT BY THE  PATIENT'S KNOWN MANTLE CELL LYMPHOMA.   #July 2022-second week-started ibrutinib 420 mg a dayx 1 week-stop because of severe rash.  Significant clinical response noted.  # AUG 1st, 2022- start acalbrutinib. 9/23-2022-rituximab weekly.  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   Michelle [name]  80 y.o.  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  Patient follows up with Dr.Brahmanday who is off today, I am covering him to see this patient. Previous medical records review was performed by me. Patient is currently on weekly rituximab. Patient reports feeling well. She takes acalabrutinib 100 mg 3 times daily.  She tolerates well. Denies any fever, chills, nausea vomiting diarrhea.   Review of Systems  Constitutional:  Negative for chills, diaphoresis, fever, malaise/fatigue and weight loss.  HENT:  Negative for nosebleeds and sore throat.   Eyes:  Negative for double vision.  Respiratory:  Negative for hemoptysis, sputum production and wheezing.   Cardiovascular:  Negative for chest pain, palpitations, orthopnea and leg swelling.  Gastrointestinal:  Positive for constipation. Negative for abdominal pain, blood in stool, diarrhea, heartburn, melena, nausea and vomiting.  Genitourinary:  Negative for dysuria, frequency and urgency.  Musculoskeletal:  Positive for back pain and joint pain.  Skin: Negative.  Negative for itching and rash.  Neurological:  Negative for dizziness, tingling, focal weakness, weakness and headaches.  Endo/Heme/Allergies:  Does not bruise/bleed easily.  Psychiatric/Behavioral:  Negative for depression. The patient is not nervous/anxious and does not have insomnia.    PAST MEDICAL HISTORY :  Past Medical History:  Diagnosis Date   Arthritis    Collagen vascular disease ([place])    GERD (gastroesophageal reflux disease)    Headache(784.0)    History of methotrexate therapy    Hyperlipidemia    hx   Lymphadenopathy of head and neck 01/2015   see on Thyroid ultrasound  Lymphoma, mantle cell ([place]) 06/01/2015   bx of lymph node in right breast/Stage IV Mantle Cell Lymphoma   Personal history of chemotherapy    Rheumatoid arthritis ([place])      PAST SURGICAL HISTORY :   Past Surgical History:  Procedure Laterality Date   BREAST BIOPSY Left 11/07/2020   u/s bx-hydromark #3 "coil"-path pending   CARDIAC CATHETERIZATION  09/2004   [facility]; EF 60%   CARDIAC CATHETERIZATION  08/2004   [facility]   IR FLUORO GUIDED NEEDLE PLC ASPIRATION/INJECTION LOC  06/18/2018   PERIPHERAL VASCULAR CATHETERIZATION N/A 07/04/2015   Procedure: [name] Insertion;  Surgeon: [name], MD;  Location: [facility];  Service: Cardiovascular;  Laterality: N/A;   PORTA CATH REMOVAL N/A 06/23/2018   Procedure: PORTA CATH REMOVAL;  Surgeon: [name], MD;  Location: [facility];  Service: Cardiovascular;  Laterality: N/A;   RIGHT/LEFT HEART CATH AND CORONARY ANGIOGRAPHY Bilateral 09/20/2019   Procedure: RIGHT/LEFT HEART CATH AND CORONARY ANGIOGRAPHY;  Surgeon: Nelva Bush, MD;  Location: [facility];  Service: Cardiovascular;  Laterality: Bilateral;    FAMILY HISTORY :   Family History  Problem Relation Age of Onset   Diabetes Mother    Cholelithiasis Mother    Hypertension Sister    Diabetes Sister    Heart murmur Sister    Arthritis Brother    Breast cancer Neg Hx     SOCIAL HISTORY:   Social History   Tobacco Use   Smoking status: Never   Smokeless tobacco: Never  Vaping Use   Vaping Use: Never used  Substance Use Topics   Alcohol use: No   Drug use: No    ALLERGIES:  has No Known Allergies.  MEDICATIONS:  Current Outpatient Medications  Medication Sig Dispense Refill   Acalabrutinib Maleate (CALQUENCE) 100 MG TABS Take 1 capsule (100 mg) by mouth in the morning and at bedtime. 60 tablet 2   acetaminophen (TYLENOL) 500 MG tablet Take 1,000 mg by mouth every 6 (six) hours as needed for mild pain.      albuterol (VENTOLIN HFA) 108 (90 Base) MCG/ACT inhaler TAKE 2 PUFFS BY MOUTH EVERY 6 HOURS AS NEEDED FOR WHEEZE OR SHORTNESS OF BREATH 18 each 1   carvedilol (COREG) 3.125 MG tablet TAKE 1 TABLET BY MOUTH  TWICE A DAY 180 tablet 0   cyanocobalamin 2000 MCG tablet Take 2,500 mcg by mouth daily.      fluticasone (FLONASE) 50 MCG/ACT nasal spray Place 2 sprays into both nostrils daily as needed for allergies. 16 g 4   furosemide (LASIX) 40 MG tablet TAKE 1 TABLET BY MOUTH EVERY DAY 90 tablet 0   losartan (COZAAR) 25 MG tablet TAKE 1 TABLET BY MOUTH EVERY DAY 90 tablet 1   MEGARED OMEGA-3 KRILL OIL PO Take 1 tablet by mouth daily.     meloxicam (MOBIC) 7.5 MG tablet TAKE 1 TABLET BY MOUTH DAILY AS NEEDED FOR PAIN 30 tablet 1   Multiple Vitamins-Minerals (CENTRUM SILVER 50+WOMEN) TABS Take by mouth.     Multiple Vitamins-Minerals (HAIR SKIN AND NAILS FORMULA PO) Take by mouth.     rosuvastatin (CRESTOR) 5 MG tablet Take 1 tablet (5 mg total) by mouth daily. 90 tablet 3   spironolactone (ALDACTONE) 25 MG tablet Take 0.5 tablets (12.5 mg total) by mouth daily. 45 tablet 3   No current facility-administered medications for this visit.   Facility-Administered Medications Ordered in Other Visits  Medication Dose Route Frequency  Provider Last Rate Last Admin   heparin lock flush 100 unit/mL  500 Units Intravenous Once Charlaine Dalton R, MD       sodium chloride flush (NS) 0.9 % injection 10 mL  10 mL Intravenous Once Charlaine Dalton R, MD       sodium chloride flush (NS) 0.9 % injection 10 mL  10 mL Intravenous Once Cammie Sickle, MD       Tbo-Filgrastim [facility]) injection 480 mcg  480 mcg Subcutaneous Once Cammie Sickle, MD        PHYSICAL EXAMINATION: ECOG PERFORMANCE STATUS: 0 - Asymptomatic  BP 124/84 (BP Location: Right Arm, Patient Position: Sitting)    Pulse 62    Temp (!) 96.7 F (35.9 C) (Tympanic)    Wt 245 lb 9.6 oz (111.4 kg)    BMI 38.45 kg/m   Filed Weights   05/17/21 0834  Weight: 245 lb 9.6 oz (111.4 kg)    Physical Exam HENT:     Head: Normocephalic and atraumatic.     Mouth/Throat:     Pharynx: No oropharyngeal exudate.  Eyes:     Pupils: Pupils  are equal, round, and reactive to light.  Cardiovascular:     Rate and Rhythm: Normal rate and regular rhythm.  Pulmonary:     Effort: No respiratory distress.     Breath sounds: No wheezing.  Abdominal:     General: Bowel sounds are normal. There is no distension.     Palpations: Abdomen is soft. There is no mass.     Tenderness: There is no abdominal tenderness. There is no guarding or rebound.  Musculoskeletal:        General: No tenderness. Normal range of motion.     Cervical back: Normal range of motion and neck supple.  Skin:    General: Skin is warm.  Neurological:     Mental Status: She is alert and oriented to person, place, and time.  Psychiatric:        Mood and Affect: Affect normal.   LABORATORY DATA:  I have reviewed the data as listed    Component Value Date/Time   NA 138 05/17/2021 0748   NA 145 (H) 09/08/2019 1359   NA 142 09/14/2013 1127   K 3.8 05/17/2021 0748   K 3.5 09/14/2013 1127   CL 107 05/17/2021 0748   CL 110 (H) 09/14/2013 1127   CO2 26 05/17/2021 0748   CO2 29 09/14/2013 1127   GLUCOSE 102 (H) 05/17/2021 0748   GLUCOSE 106 (H) 09/14/2013 1127   BUN 15 05/17/2021 0748   BUN 15 09/08/2019 1359   BUN 8 09/14/2013 1127   CREATININE 0.78 05/17/2021 0748   CREATININE 0.71 09/14/2013 1127   CREATININE 0.68 04/01/2013 1550   CALCIUM 8.7 (L) 05/17/2021 0748   CALCIUM 8.6 09/14/2013 1127   PROT 6.8 05/17/2021 0748   PROT 7.6 09/14/2013 1127   ALBUMIN 3.7 05/17/2021 0748   ALBUMIN 3.2 (L) 09/14/2013 1127   AST 20 05/17/2021 0748   AST 22 09/14/2013 1127   ALT 12 05/17/2021 0748   ALT 19 09/14/2013 1127   ALKPHOS 67 05/17/2021 0748   ALKPHOS 76 09/14/2013 1127   BILITOT 0.6 05/17/2021 0748   BILITOT 0.4 09/14/2013 1127   GFRNONAA >60 05/17/2021 0748   GFRNONAA >60 09/14/2013 1127   GFRAA >60 01/20/2020 1303   GFRAA >60 09/14/2013 1127    No results found for: SPEP, UPEP  Lab Results  Component Value Date  WBC 6.6 05/17/2021    NEUTROABS 4.8 05/17/2021   HGB 13.5 05/17/2021   HCT 41.8 05/17/2021   MCV 93.1 05/17/2021   PLT 204 05/17/2021      Chemistry      Component Value Date/Time   NA 138 05/17/2021 0748   NA 145 (H) 09/08/2019 1359   NA 142 09/14/2013 1127   K 3.8 05/17/2021 0748   K 3.5 09/14/2013 1127   CL 107 05/17/2021 0748   CL 110 (H) 09/14/2013 1127   CO2 26 05/17/2021 0748   CO2 29 09/14/2013 1127   BUN 15 05/17/2021 0748   BUN 15 09/08/2019 1359   BUN 8 09/14/2013 1127   CREATININE 0.78 05/17/2021 0748   CREATININE 0.71 09/14/2013 1127   CREATININE 0.68 04/01/2013 1550      Component Value Date/Time   CALCIUM 8.7 (L) 05/17/2021 0748   CALCIUM 8.6 09/14/2013 1127   ALKPHOS 67 05/17/2021 0748   ALKPHOS 76 09/14/2013 1127   AST 20 05/17/2021 0748   AST 22 09/14/2013 1127   ALT 12 05/17/2021 0748   ALT 19 09/14/2013 1127   BILITOT 0.6 05/17/2021 0748   BILITOT 0.4 09/14/2013 1127       ASSESSMENT & PLAN:   1. 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 Encounter for antineoplastic chemotherapy   3. Hypocalcemia    #Mantle cell lymphoma Labs reviewed and discussed discussed with patient. Proceed with rituximab today. Continue acalabrutinib 100 mg twice daily.  She tolerates well.  #Mild hypocalcemia, recommend patient to take over-the-counter calcium supplementation.  All questions were answered. The patient knows to call the clinic with any problems, questions or concerns.  Follow-up in 4 weeks, lab with Dr. Rogue Bussing plus rituximab treatment.    Earlie Server, MD 05/17/2021 7:22 PM

## 2021-05-21 ENCOUNTER — Other Ambulatory Visit (HOSPITAL_COMMUNITY): Payer: Self-pay

## 2021-05-23 ENCOUNTER — Other Ambulatory Visit (HOSPITAL_COMMUNITY): Payer: Self-pay

## 2021-06-13 ENCOUNTER — Other Ambulatory Visit: Payer: Self-pay | Admitting: Internal Medicine

## 2021-06-14 ENCOUNTER — Inpatient Hospital Stay: Payer: Medicare HMO | Attending: Internal Medicine

## 2021-06-14 ENCOUNTER — Other Ambulatory Visit: Payer: Self-pay

## 2021-06-14 ENCOUNTER — Inpatient Hospital Stay: Payer: Medicare HMO | Admitting: Internal Medicine

## 2021-06-14 ENCOUNTER — Encounter: Payer: Self-pay | Admitting: Internal Medicine

## 2021-06-14 ENCOUNTER — Inpatient Hospital Stay: Payer: Medicare HMO

## 2021-06-14 ENCOUNTER — Encounter: Payer: Self-pay | Admitting: Nurse Practitioner

## 2021-06-14 VITALS — BP 121/77 | HR 62 | Resp 18

## 2021-06-14 DIAGNOSIS — Z5112 Encounter for antineoplastic immunotherapy: Secondary | ICD-10-CM | POA: Diagnosis not present

## 2021-06-14 DIAGNOSIS — C8311 Mantle cell lymphoma, lymph nodes of head, face, and neck: Secondary | ICD-10-CM | POA: Insufficient documentation

## 2021-06-14 LAB — CBC WITH DIFFERENTIAL/PLATELET
Abs Immature Granulocytes: 0.04 10*3/uL (ref 0.00–0.07)
Basophils Absolute: 0 10*3/uL (ref 0.0–0.1)
Basophils Relative: 0 %
Eosinophils Absolute: 0.1 10*3/uL (ref 0.0–0.5)
Eosinophils Relative: 2 %
HCT: 40.6 % (ref 36.0–46.0)
Hemoglobin: 13.1 g/dL (ref 12.0–15.0)
Immature Granulocytes: 1 %
Lymphocytes Relative: 18 %
Lymphs Abs: 1.4 10*3/uL (ref 0.7–4.0)
MCH: 30.3 pg (ref 26.0–34.0)
MCHC: 32.3 g/dL (ref 30.0–36.0)
MCV: 93.8 fL (ref 80.0–100.0)
Monocytes Absolute: 0.5 10*3/uL (ref 0.1–1.0)
Monocytes Relative: 7 %
Neutro Abs: 5.5 10*3/uL (ref 1.7–7.7)
Neutrophils Relative %: 72 %
Platelets: 190 10*3/uL (ref 150–400)
RBC: 4.33 MIL/uL (ref 3.87–5.11)
RDW: 13.5 % (ref 11.5–15.5)
WBC: 7.5 10*3/uL (ref 4.0–10.5)
nRBC: 0 % (ref 0.0–0.2)

## 2021-06-14 LAB — BASIC METABOLIC PANEL
Anion gap: 6 (ref 5–15)
BUN: 19 mg/dL (ref 8–23)
CO2: 27 mmol/L (ref 22–32)
Calcium: 8.4 mg/dL — ABNORMAL LOW (ref 8.9–10.3)
Chloride: 99 mmol/L (ref 98–111)
Creatinine, Ser: 0.78 mg/dL (ref 0.44–1.00)
GFR, Estimated: >60 mL/min (ref >60)
Glucose, Bld: 97 mg/dL (ref 70–99)
Potassium: 3.7 mmol/L (ref 3.5–5.1)
Sodium: 132 mmol/L — ABNORMAL LOW (ref 135–145)

## 2021-06-14 LAB — HEPATIC FUNCTION PANEL
ALT: 11 U/L (ref 0–44)
AST: 22 U/L (ref 15–41)
Albumin: 3.7 g/dL (ref 3.5–5.0)
Alkaline Phosphatase: 67 U/L (ref 38–126)
Bilirubin, Direct: 0.1 mg/dL (ref 0.0–0.2)
Indirect Bilirubin: 0.7 mg/dL (ref 0.3–0.9)
Total Bilirubin: 0.8 mg/dL (ref 0.3–1.2)
Total Protein: 6.5 g/dL (ref 6.5–8.1)

## 2021-06-14 MED ORDER — SODIUM CHLORIDE 0.9 % IV SOLN
Freq: Once | INTRAVENOUS | Status: AC
  Filled 2021-06-14: qty 250

## 2021-06-14 MED ORDER — SODIUM CHLORIDE 0.9 % IV SOLN
375.0000 mg/m2 | Freq: Once | INTRAVENOUS | Status: AC
  Administered 2021-06-14: 900 mg via INTRAVENOUS
  Filled 2021-06-14: qty 50

## 2021-06-14 MED ORDER — DIPHENHYDRAMINE HCL 25 MG PO CAPS
50.0000 mg | ORAL_CAPSULE | Freq: Once | ORAL | Status: AC
  Administered 2021-06-14: 50 mg via ORAL
  Filled 2021-06-14: qty 2

## 2021-06-14 MED ORDER — ACETAMINOPHEN 325 MG PO TABS
650.0000 mg | ORAL_TABLET | Freq: Once | ORAL | Status: AC
  Administered 2021-06-14: 650 mg via ORAL
  Filled 2021-06-14: qty 2

## 2021-06-14 NOTE — Progress Notes (Signed)
Patient reports she is "just not feeling good" today.  She says it may be related to wearing Cpap machine for a few weeks now and didn't sleep well last night.  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

## 2021-06-14 NOTE — Progress Notes (Signed)
At 10:59 am, patient called me over to chairside c/o numbness in both hands but no other symptoms. Rituxan was ruuning at 100% (200 ml/hr) and was stopped at that time. NP was called to chairside. Symptoms subsided and VSS. Per Beckey Rutter, NP restarted Rituxan at half rate (100 ml/hr) for 20 minutes. Patient tolerated well. After 20 minutes, increased rate to 75 % (150 ml/hr) per NP. Rituxan was finished at that rate at 12:08 without further incident. Patient was discharged in stable condition.

## 2021-06-14 NOTE — Progress Notes (Signed)
Called to infusion suite for possible reaction to rituximab. Patient reporting numbness of the hands bilaterally that awoke her from sleep. Infusion stopped. Vitals normal. No other symptoms. I restarted infusion at 50% of original rate. She tolerated well. Increased to 75% of original rate. Continued to tolerate well. No other complications and symptoms did not recur.

## 2021-06-14 NOTE — Patient Instructions (Signed)
[facility]  Discharge Instructions: ?Thank you for choosing [facility] to provide your oncology and hematology care.  ?If you have a lab appointment with the Cancer Center, please go directly to the Cancer Center and check in at the registration area. ? ?Wear comfortable clothing and clothing appropriate for easy access to any Portacath or PICC line.  ? ?We strive to give you quality time with your provider. You may need to reschedule your appointment if you arrive late (15 or more minutes).  Arriving late affects you and other patients whose appointments are after yours.  Also, if you miss three or more appointments without notifying the office, you may be dismissed from the clinic at the provider?s discretion.    ?  ?For prescription refill requests, have your pharmacy contact our office and allow 72 hours for refills to be completed.   ? ?  ?To help prevent nausea and vomiting after your treatment, we encourage you to take your nausea medication as directed. ? ?BELOW ARE SYMPTOMS THAT SHOULD BE REPORTED IMMEDIATELY: ?*FEVER GREATER THAN 100.4 F (38 ?C) OR HIGHER ?*CHILLS OR SWEATING ?*NAUSEA AND VOMITING THAT IS NOT CONTROLLED WITH YOUR NAUSEA MEDICATION ?*UNUSUAL SHORTNESS OF BREATH ?*UNUSUAL BRUISING OR BLEEDING ?*URINARY PROBLEMS (pain or burning when urinating, or frequent urination) ?*BOWEL PROBLEMS (unusual diarrhea, constipation, pain near the anus) ?TENDERNESS IN MOUTH AND THROAT WITH OR WITHOUT PRESENCE OF ULCERS (sore throat, sores in mouth, or a toothache) ?UNUSUAL RASH, SWELLING OR PAIN  ?UNUSUAL VAGINAL DISCHARGE OR ITCHING  ? ?Items with * indicate a potential emergency and should be followed up as soon as possible or go to the Emergency Department if any problems should occur. ? ?Please show the CHEMOTHERAPY ALERT CARD or IMMUNOTHERAPY ALERT CARD at check-in to the Emergency Department and triage nurse. ? ?Should you have questions after your visit  or need to cancel or reschedule your appointment, please contact [facility]  [phone] and follow the prompts.  Office hours are 8:00 a.m. to 4:30 p.m. Monday - Friday. Please note that voicemails left after 4:00 p.m. may not be returned until the following business day.  We are closed weekends and major holidays. You have access to a nurse at all times for urgent questions. Please call the main number to the clinic [phone] and follow the prompts. ? ?For any non-urgent questions, you may also contact your provider using MyChart. We now offer e-Visits for anyone 18 and older to request care online for non-urgent symptoms. For details visit [email or web address]. ?  ?Also download the MyChart app! Go to the app store, search "MyChart", open the app, select [facility], and log in with your MyChart username and password. ? ?Due to Covid, a mask is required upon entering the hospital/clinic. If you do not have a mask, one will be given to you upon arrival. For doctor visits, patients may have 1 support person aged 18 or older with them. For treatment visits, patients cannot have anyone with them due to current Covid guidelines and our immunocompromised population.  ?

## 2021-06-14 NOTE — Progress Notes (Signed)
[facility] OFFICE PROGRESS NOTE  Patient Care Team: Burnard Hawthorne, FNP as PCP - General (Family Medicine) End, Harrell Gave, MD as PCP - Cardiology (Cardiology) Deboraha Sprang, MD as PCP - Electrophysiology (Cardiology) Jackolyn Confer, MD (Internal Medicine) Cammie Sickle, MD as Consulting Physician (Internal Medicine)   Cancer Staging  No matching staging information was found for the patient.   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 Feb 9th- START Benda-Ritux with neulasta; Prolonged neutropenia; DISCONT- Benda-Ritux;   # April 13 th 2017- START R-CHOP x1; severe/prolonged neutropenia; PET- CR; BMBx-Neg; Disc R-CHOP  # 26th MAY 2017- Start Rituxan q 13M Main OCT 12th 2017- PET NED.  Stop Rituxan maintenance x 2 years [aug 2019-pneumonia]  # June 2022- DIAGNOSIS: thickened cortex of 12 mm. An additional lymph node demonstrates a thickened cortex of 7 mm. A third lymph node demonstrates a mildly thickened cortex of 4 mm A. LYMPH NODE, LEFT AXILLA; ULTRASOUND-GUIDED BIOPSY:  - CD5+ MONOCLONAL B-CELL POPULATION; COMPATIBLE WITH INVOLVEMENT BY THE  PATIENT'S KNOWN MANTLE CELL LYMPHOMA.   #July 2022-second week-started ibrutinib 420 mg a dayx 1 week-stop because of severe rash.  Significant clinical response noted.  # AUG 1st, 2022- start acalbrutinib. 9/23-2022-rituximab weekly.  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   Michelle [name] 81 y.o.  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   Patient's constipation improved since being on Dulcolax.  No diarrhea.  No abdominal pain.  Complains of shortness of breath intermittently with exertion.  No swelling in legs.  Patient noncompliant with her inhalers.  Review of Systems  Constitutional:  Negative for chills, diaphoresis, fever, malaise/fatigue and weight loss.  HENT:  Negative for nosebleeds and sore throat.   Eyes:  Negative for double vision.  Respiratory:  Negative for hemoptysis, sputum production and wheezing.   Cardiovascular:  Negative for chest pain, palpitations, orthopnea and leg swelling.  Gastrointestinal:  Positive for constipation. Negative for abdominal pain, blood in stool, diarrhea, heartburn, melena, nausea and vomiting.  Genitourinary:  Negative for dysuria, frequency and urgency.  Musculoskeletal:  Positive for back pain and joint pain.  Skin: Negative.  Negative for itching and rash.  Neurological:  Negative for dizziness, tingling, focal weakness, weakness and headaches.  Endo/Heme/Allergies:  Does not bruise/bleed easily.  Psychiatric/Behavioral:  Negative for depression. The patient is not nervous/anxious and does not have insomnia.    PAST MEDICAL HISTORY :  Past Medical History:  Diagnosis Date   Arthritis    Collagen vascular disease ([place])    GERD (gastroesophageal reflux disease)    Headache(784.0)    History of methotrexate therapy    Hyperlipidemia    hx   Lymphadenopathy of head and neck 01/2015   see on Thyroid ultrasound   Lymphoma, mantle cell ([place]) 06/01/2015  bx of lymph node in right breast/Stage IV Mantle Cell Lymphoma   Personal history of chemotherapy    Rheumatoid arthritis ([place])     PAST SURGICAL HISTORY :   Past Surgical  History:  Procedure Laterality Date   BREAST BIOPSY Left 11/07/2020   u/s bx-hydromark #3 "coil"-path pending   CARDIAC CATHETERIZATION  09/2004   [facility]; EF 60%   CARDIAC CATHETERIZATION  08/2004   [facility]   IR FLUORO GUIDED NEEDLE PLC ASPIRATION/INJECTION LOC  06/18/2018   PERIPHERAL VASCULAR CATHETERIZATION N/A 07/04/2015   Procedure: [name] Insertion;  Surgeon: [name], MD;  Location: [facility];  Service: Cardiovascular;  Laterality: N/A;   PORTA CATH REMOVAL N/A 06/23/2018   Procedure: PORTA CATH REMOVAL;  Surgeon: [name], MD;  Location: [facility];  Service: Cardiovascular;  Laterality: N/A;   RIGHT/LEFT HEART CATH AND CORONARY ANGIOGRAPHY Bilateral 09/20/2019   Procedure: RIGHT/LEFT HEART CATH AND CORONARY ANGIOGRAPHY;  Surgeon: Nelva Bush, MD;  Location: [facility];  Service: Cardiovascular;  Laterality: Bilateral;    FAMILY HISTORY :   Family History  Problem Relation Age of Onset   Diabetes Mother    Cholelithiasis Mother    Hypertension Sister    Diabetes Sister    Heart murmur Sister    Arthritis Brother    Breast cancer Neg Hx     SOCIAL HISTORY:   Social History   Tobacco Use   Smoking status: Never   Smokeless tobacco: Never  Vaping Use   Vaping Use: Never used  Substance Use Topics   Alcohol use: No   Drug use: No    ALLERGIES:  has No Known Allergies.  MEDICATIONS:  Current Outpatient Medications  Medication Sig Dispense Refill   Acalabrutinib Maleate (CALQUENCE) 100 MG TABS Take 1 capsule (100 mg) by mouth in the morning and at bedtime. 60 tablet 2   acetaminophen (TYLENOL) 500 MG tablet Take 1,000 mg by mouth every 6 (six) hours as needed for mild pain.      albuterol (VENTOLIN HFA) 108 (90 Base) MCG/ACT inhaler TAKE 2 PUFFS BY MOUTH EVERY 6 HOURS AS NEEDED FOR WHEEZE OR SHORTNESS OF BREATH 18 each 1   carvedilol (COREG) 3.125 MG tablet TAKE 1 TABLET BY MOUTH TWICE A DAY 180 tablet 0   cyanocobalamin 2000 MCG  tablet Take 2,500 mcg by mouth daily.      docusate sodium (COLACE) 100 MG capsule Take 100 mg by mouth 2 (two) times daily as needed for mild constipation.     fluticasone (FLONASE) 50 MCG/ACT nasal spray Place 2 sprays into both nostrils daily as needed for allergies. 16 g 4   furosemide (LASIX) 40 MG tablet TAKE 1 TABLET BY MOUTH EVERY DAY 90 tablet 0   losartan (COZAAR) 25 MG tablet TAKE 1 TABLET BY MOUTH EVERY DAY 90 tablet 1   MEGARED OMEGA-3 KRILL OIL PO Take 1 tablet by mouth daily.     meloxicam (MOBIC) 7.5 MG tablet TAKE 1 TABLET BY MOUTH EVERY DAY AS NEEDED FOR PAIN 30 tablet 1   Multiple Vitamins-Minerals (CENTRUM SILVER 50+WOMEN) TABS Take by mouth.     Multiple Vitamins-Minerals (HAIR SKIN AND NAILS FORMULA PO) Take by mouth.     ondansetron (ZOFRAN) 8 MG tablet Take 8 mg by mouth every 8 (eight) hours as needed for nausea or vomiting.     rosuvastatin (CRESTOR) 5 MG tablet Take 1 tablet (5 mg total) by mouth daily. 90 tablet  3   spironolactone (ALDACTONE) 25 MG tablet Take 0.5 tablets (12.5 mg total) by mouth daily. 45 tablet 3   No current facility-administered medications for this visit.   Facility-Administered Medications Ordered in Other Visits  Medication Dose Route Frequency Provider Last Rate Last Admin   heparin lock flush 100 unit/mL  500 Units Intravenous Once Charlaine Dalton R, MD       sodium chloride flush (NS) 0.9 % injection 10 mL  10 mL Intravenous Once Charlaine Dalton R, MD       sodium chloride flush (NS) 0.9 % injection 10 mL  10 mL Intravenous Once Cammie Sickle, MD       Tbo-Filgrastim [facility]) injection 480 mcg  480 mcg Subcutaneous Once Cammie Sickle, MD        PHYSICAL EXAMINATION: ECOG PERFORMANCE STATUS: 0 - Asymptomatic  BP 120/67    Pulse 63    Temp (!) 97 F (36.1 C)    Resp 18    Wt 246 lb 9.6 oz (111.9 kg)    BMI 38.61 kg/m   Filed Weights   06/14/21 0833  Weight: 246 lb 9.6 oz (111.9 kg)    Physical Exam HENT:      Head: Normocephalic and atraumatic.     Mouth/Throat:     Pharynx: No oropharyngeal exudate.  Eyes:     Pupils: Pupils are equal, round, and reactive to light.  Cardiovascular:     Rate and Rhythm: Normal rate and regular rhythm.  Pulmonary:     Effort: No respiratory distress.     Breath sounds: No wheezing.  Abdominal:     General: Bowel sounds are normal. There is no distension.     Palpations: Abdomen is soft. There is no mass.     Tenderness: There is no abdominal tenderness. There is no guarding or rebound.  Musculoskeletal:        General: No tenderness. Normal range of motion.     Cervical back: Normal range of motion and neck supple.  Skin:    General: Skin is warm.  Neurological:     Mental Status: She is alert and oriented to person, place, and time.  Psychiatric:        Mood and Affect: Affect normal.   LABORATORY DATA:  I have reviewed the data as listed    Component Value Date/Time   NA 132 (L) 06/14/2021 0802   NA 145 (H) 09/08/2019 1359   NA 142 09/14/2013 1127   K 3.7 06/14/2021 0802   K 3.5 09/14/2013 1127   CL 99 06/14/2021 0802   CL 110 (H) 09/14/2013 1127   CO2 27 06/14/2021 0802   CO2 29 09/14/2013 1127   GLUCOSE 97 06/14/2021 0802   GLUCOSE 106 (H) 09/14/2013 1127   BUN 19 06/14/2021 0802   BUN 15 09/08/2019 1359   BUN 8 09/14/2013 1127   CREATININE 0.78 06/14/2021 0802   CREATININE 0.71 09/14/2013 1127   CREATININE 0.68 04/01/2013 1550   CALCIUM 8.4 (L) 06/14/2021 0802   CALCIUM 8.6 09/14/2013 1127   PROT 6.5 06/14/2021 0802   PROT 7.6 09/14/2013 1127   ALBUMIN 3.7 06/14/2021 0802   ALBUMIN 3.2 (L) 09/14/2013 1127   AST 22 06/14/2021 0802   AST 22 09/14/2013 1127   ALT 11 06/14/2021 0802   ALT 19 09/14/2013 1127   ALKPHOS 67 06/14/2021 0802   ALKPHOS 76 09/14/2013 1127   BILITOT 0.8 06/14/2021 0802   BILITOT 0.4 09/14/2013 1127  GFRNONAA >60 06/14/2021 0802   GFRNONAA >60 09/14/2013 1127   GFRAA >60 01/20/2020 1303   GFRAA  >60 09/14/2013 1127    No results found for: SPEP, UPEP  Lab Results  Component Value Date   WBC 7.5 06/14/2021   NEUTROABS 5.5 06/14/2021   HGB 13.1 06/14/2021   HCT 40.6 06/14/2021   MCV 93.8 06/14/2021   PLT 190 06/14/2021      Chemistry      Component Value Date/Time   NA 132 (L) 06/14/2021 0802   NA 145 (H) 09/08/2019 1359   NA 142 09/14/2013 1127   K 3.7 06/14/2021 0802   K 3.5 09/14/2013 1127   CL 99 06/14/2021 0802   CL 110 (H) 09/14/2013 1127   CO2 27 06/14/2021 0802   CO2 29 09/14/2013 1127   BUN 19 06/14/2021 0802   BUN 15 09/08/2019 1359   BUN 8 09/14/2013 1127   CREATININE 0.78 06/14/2021 0802   CREATININE 0.71 09/14/2013 1127   CREATININE 0.68 04/01/2013 1550      Component Value Date/Time   CALCIUM 8.4 (L) 06/14/2021 0802   CALCIUM 8.6 09/14/2013 1127   ALKPHOS 67 06/14/2021 0802   ALKPHOS 76 09/14/2013 1127   AST 22 06/14/2021 0802   AST 22 09/14/2013 1127   ALT 11 06/14/2021 0802   ALT 19 09/14/2013 1127   BILITOT 0.8 06/14/2021 0802   BILITOT 0.4 09/14/2013 1127       RADIOGRAPHIC STUDIES: I have personally reviewed the radiological images as listed and agreed with the findings in the report. No results found.   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 STABLE.  Currently on Calquence and rituximab- monthly x 8 and then every 2 months x 2  Years.   # Currently on Calequence 100 mg twice a day x AUG 1st, 2022.  Proceed with Rituximab monthly #3 of planned #8; and then q2M. Tolerating treatment extremely well. Will plan scans in feb, 2023.   # Left shoulder pain/ Chronic arthritis-on meloxicam as needed- STABLE.   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   # Pulmonary hypertension/chronic bilateral lung scarring- [s/p pulmonary evaluation]-recommend compliance with albuterol.; STABLE  # Constipation:  continue dulcolax prn- STABLE  # DISPOSITION: fridays pt pref-  # ADD LFTs to labs from today # Rituaxn today # follow up-MD in 4 weeks [ MD ;labs- cbc/cmp/ldh/BNP -Rituxan IV; PET.Dr.B         Orders Placed This Encounter  Procedures   NM PET Image Restage (PS) Skull Base to Thigh (F-18 FDG)    Standing Status:   Future    Standing Expiration Date:   06/14/2022    Order Specific Question:   If indicated for the ordered procedure, I authorize the administration of a radiopharmaceutical per Radiology protocol    Answer:   Yes    Order Specific Question:   Preferred imaging location?    Answer:   [facility]   CBC with Differential/Platelet    Standing Status:   Future    Standing Expiration Date:   06/14/2022  Comprehensive metabolic panel    Standing Status:   Future    Standing Expiration Date:   06/14/2022   Lactate dehydrogenase    Standing Status:   Future    Standing Expiration Date:   06/14/2022   Brain natriuretic peptide    Standing Status:   Future    Standing Expiration Date:   06/14/2022   Hepatic function panel    Standing Status:   Future    Number of Occurrences:   1    Standing Expiration Date:   06/14/2022     All questions were answered. The patient knows to call the clinic with any problems, questions or concerns.      Cammie Sickle, MD 06/14/2021 10:33 AM

## 2021-06-14 NOTE — Assessment & Plan Note (Addendum)
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 STABLE.  Currently on Calquence and rituximab- monthly x 8 and then every 2 months x 2  Years.   # Currently on Calequence 100 mg twice a day x AUG 1st, 2022.  Proceed with Rituximab monthly #3 of planned #8; and then q2M. Tolerating treatment extremely well. Will plan scans in feb, 2023.   # Left shoulder pain/ Chronic arthritis-on meloxicam as needed- STABLE.   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   # Pulmonary hypertension/chronic bilateral lung scarring- [s/p pulmonary evaluation]-recommend compliance with albuterol.; STABLE  # Constipation:  continue dulcolax prn- STABLE  # DISPOSITION: fridays pt pref-  # ADD LFTs to labs from today # Rituaxn today # follow up-MD in 4 weeks [ MD ;labs- cbc/cmp/ldh/BNP -Rituxan IV; PET.Dr.B

## 2021-06-18 ENCOUNTER — Other Ambulatory Visit (HOSPITAL_COMMUNITY): Payer: Self-pay

## 2021-06-20 ENCOUNTER — Other Ambulatory Visit (HOSPITAL_COMMUNITY): Payer: Self-pay

## 2021-06-21 ENCOUNTER — Other Ambulatory Visit: Payer: Self-pay | Admitting: Internal Medicine

## 2021-07-08 ENCOUNTER — Other Ambulatory Visit: Payer: Self-pay

## 2021-07-08 ENCOUNTER — Ambulatory Visit
Admission: RE | Admit: 2021-07-08 | Discharge: 2021-07-08 | Disposition: A | Discharge status: Home / Self Care | Payer: Medicare HMO | Source: Ambulatory Visit | Attending: Internal Medicine | Admitting: Internal Medicine

## 2021-07-08 DIAGNOSIS — C8311 Mantle cell lymphoma, lymph nodes of head, face, and neck: Secondary | ICD-10-CM | POA: Diagnosis not present

## 2021-07-08 DIAGNOSIS — R590 Localized enlarged lymph nodes: Secondary | ICD-10-CM | POA: Diagnosis not present

## 2021-07-08 LAB — GLUCOSE, CAPILLARY: Glucose-Capillary: 109 mg/dL — ABNORMAL HIGH (ref 70–99)

## 2021-07-08 MED ORDER — FLUDEOXYGLUCOSE F - 18 (FDG) INJECTION
12.6300 | Freq: Once | INTRAVENOUS | Status: AC | PRN
  Administered 2021-07-08: 12.63 via INTRAVENOUS

## 2021-07-10 NOTE — Progress Notes (Signed)
I tried to reach patient regarding the results of the pet scan. Unable to leave a voicemail.

## 2021-07-12 ENCOUNTER — Inpatient Hospital Stay: Payer: Medicare HMO | Admitting: Internal Medicine

## 2021-07-12 ENCOUNTER — Encounter: Payer: Self-pay | Admitting: Internal Medicine

## 2021-07-12 ENCOUNTER — Other Ambulatory Visit: Payer: Self-pay | Admitting: Family

## 2021-07-12 ENCOUNTER — Inpatient Hospital Stay: Payer: Medicare HMO | Attending: Internal Medicine

## 2021-07-12 ENCOUNTER — Other Ambulatory Visit: Payer: Self-pay

## 2021-07-12 ENCOUNTER — Inpatient Hospital Stay: Payer: Medicare HMO

## 2021-07-12 VITALS — BP 93/67 | HR 61

## 2021-07-12 DIAGNOSIS — Z5112 Encounter for antineoplastic immunotherapy: Secondary | ICD-10-CM | POA: Insufficient documentation

## 2021-07-12 DIAGNOSIS — J309 Allergic rhinitis, unspecified: Secondary | ICD-10-CM

## 2021-07-12 DIAGNOSIS — I428 Other cardiomyopathies: Secondary | ICD-10-CM | POA: Diagnosis not present

## 2021-07-12 DIAGNOSIS — C8311 Mantle cell lymphoma, lymph nodes of head, face, and neck: Secondary | ICD-10-CM | POA: Diagnosis not present

## 2021-07-12 DIAGNOSIS — I272 Pulmonary hypertension, unspecified: Secondary | ICD-10-CM | POA: Insufficient documentation

## 2021-07-12 DIAGNOSIS — J984 Other disorders of lung: Secondary | ICD-10-CM | POA: Diagnosis not present

## 2021-07-12 LAB — COMPREHENSIVE METABOLIC PANEL
ALT: 11 U/L (ref 0–44)
AST: 19 U/L (ref 15–41)
Albumin: 3.6 g/dL (ref 3.5–5.0)
Alkaline Phosphatase: 65 U/L (ref 38–126)
Anion gap: 6 (ref 5–15)
BUN: 16 mg/dL (ref 8–23)
CO2: 27 mmol/L (ref 22–32)
Calcium: 8.8 mg/dL — ABNORMAL LOW (ref 8.9–10.3)
Chloride: 103 mmol/L (ref 98–111)
Creatinine, Ser: 0.88 mg/dL (ref 0.44–1.00)
GFR, Estimated: >60 mL/min (ref >60)
Glucose, Bld: 101 mg/dL — ABNORMAL HIGH (ref 70–99)
Potassium: 4.1 mmol/L (ref 3.5–5.1)
Sodium: 136 mmol/L (ref 135–145)
Total Bilirubin: 0.8 mg/dL (ref 0.3–1.2)
Total Protein: 6.7 g/dL (ref 6.5–8.1)

## 2021-07-12 LAB — CBC WITH DIFFERENTIAL/PLATELET
Abs Immature Granulocytes: 0.04 10*3/uL (ref 0.00–0.07)
Basophils Absolute: 0 10*3/uL (ref 0.0–0.1)
Basophils Relative: 0 %
Eosinophils Absolute: 0.1 10*3/uL (ref 0.0–0.5)
Eosinophils Relative: 2 %
HCT: 40.3 % (ref 36.0–46.0)
Hemoglobin: 12.9 g/dL (ref 12.0–15.0)
Immature Granulocytes: 1 %
Lymphocytes Relative: 17 %
Lymphs Abs: 1.2 10*3/uL (ref 0.7–4.0)
MCH: 30.1 pg (ref 26.0–34.0)
MCHC: 32 g/dL (ref 30.0–36.0)
MCV: 93.9 fL (ref 80.0–100.0)
Monocytes Absolute: 0.5 10*3/uL (ref 0.1–1.0)
Monocytes Relative: 7 %
Neutro Abs: 5.5 10*3/uL (ref 1.7–7.7)
Neutrophils Relative %: 73 %
Platelets: 201 10*3/uL (ref 150–400)
RBC: 4.29 MIL/uL (ref 3.87–5.11)
RDW: 13.8 % (ref 11.5–15.5)
WBC: 7.5 10*3/uL (ref 4.0–10.5)
nRBC: 0 % (ref 0.0–0.2)

## 2021-07-12 LAB — BRAIN NATRIURETIC PEPTIDE: B Natriuretic Peptide: 38.7 pg/mL (ref 0.0–100.0)

## 2021-07-12 LAB — LACTATE DEHYDROGENASE: LDH: 157 U/L (ref 98–192)

## 2021-07-12 MED ORDER — SODIUM CHLORIDE 0.9 % IV SOLN
375.0000 mg/m2 | Freq: Once | INTRAVENOUS | Status: AC
  Administered 2021-07-12: 900 mg via INTRAVENOUS
  Filled 2021-07-12: qty 50

## 2021-07-12 MED ORDER — ACETAMINOPHEN 325 MG PO TABS
650.0000 mg | ORAL_TABLET | Freq: Once | ORAL | Status: AC
  Administered 2021-07-12: 650 mg via ORAL
  Filled 2021-07-12: qty 2

## 2021-07-12 MED ORDER — DIPHENHYDRAMINE HCL 25 MG PO CAPS
50.0000 mg | ORAL_CAPSULE | Freq: Once | ORAL | Status: AC
  Administered 2021-07-12: 50 mg via ORAL
  Filled 2021-07-12: qty 2

## 2021-07-12 MED ORDER — SODIUM CHLORIDE 0.9 % IV SOLN
Freq: Once | INTRAVENOUS | Status: AC
  Filled 2021-07-12: qty 250

## 2021-07-12 NOTE — Assessment & Plan Note (Addendum)
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 Currently on Calquence and maintenance rituximab- monthly x 3/8 [ and then every 2 months x 2  Years] February 2023-complete response;  No evidence non-Hodgkin's lymphoma/mantle cell recurrence or progression  # Continue Calequence 100 mg twice a day x AUG 1st, 2022.  Continue Rituximab monthly #3 of planned #8; and then q2M. Tolerating treatment extremely well.   # Left shoulder pain/ Chronic arthritis-on meloxicam as needed- STABLE.   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   # Pulmonary hypertension/chronic bilateral lung scarring- [s/p pulmonary evaluation]-recommend compliance with albuterol.; STABLE.   # Constipation:  continue dulcolax prn- STABLE.   # DISPOSITION: fridays pt pref-  # Rituaxn today # follow up-MD in 4 weeks [ MD ;labs- cbc/cmp/ldh/BNP -Rituxan IV;.Dr.B  # I reviewed the blood work- with the patient in detail; also reviewed the imaging independently [as summarized above]; and with the patient in detail.

## 2021-07-12 NOTE — Progress Notes (Signed)
[facility] OFF[facility] PROGRESS NOTE  Patient Care Team: Burnard Hawthorne, FNP as PCP - General (Family Medicine) End, Harrell Gave, MD as PCP - Cardiology (Cardiology) Deboraha Sprang, MD as PCP - Electrophysiology (Cardiology) Jackolyn Confer, MD (Internal Medicine) Cammie Sickle, MD as Consulting Physician (Internal Medicine)   Cancer Staging  No matching staging information was found for the patient.   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 Feb 9th- START Benda-Ritux with neulasta; Prolonged neutropenia; DISCONT- Benda-Ritux;   # April 13 th 2017- START R-CHOP x1; severe/prolonged neutropenia; PET- CR; BMBx-Neg; Disc R-CHOP  # 26th MAY 2017- Start Rituxan q 50M Main OCT 12th 2017- PET NED.  Stop Rituxan maintenance x 2 years [aug 2019-pneumonia]  # June 2022- DIAGNOSIS: thickened cortex of 12 mm. An additional lymph node demonstrates a thickened cortex of 7 mm. A third lymph node demonstrates a mildly thickened cortex of 4 mm A. LYMPH NODE, LEFT AXILLA; ULTRASOUND-GUIDED BIOPSY:  - CD5+ MONOCLONAL B-CELL POPULATION; COMPATIBLE WITH INVOLVEMENT BY THE  PATIENT'S KNOWN MANTLE CELL LYMPHOMA.   #July 2022-second week-started ibrutinib 420 mg a dayx 1 week-stop because of severe rash.  Significant clinical response noted.  # AUG 1st, 2022- start acalbrutinib. 9/23-2022-rituximab weekly.  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   Michelle [name] 81 y.o.  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   Patient complains of mild nausea improved with Zofran.   Otherwise no worsening constipation or shortness of breath or cough. No swelling in legs.   Review of Systems  Constitutional:  Negative for chills, diaphoresis, fever, malaise/fatigue and weight loss.  HENT:  Negative for nosebleeds and sore throat.   Eyes:  Negative for double vision.  Respiratory:  Negative for hemoptysis, sputum production and wheezing.   Cardiovascular:  Negative for chest pain, palpitations, orthopnea and leg swelling.  Gastrointestinal:  Positive for constipation. Negative for abdominal pain, blood in stool, diarrhea, heartburn, melena, nausea and vomiting.  Genitourinary:  Negative for dysuria, frequency and urgency.  Musculoskeletal:  Positive for back pain and joint pain.  Skin: Negative.  Negative for itching and rash.  Neurological:  Negative for dizziness, tingling, focal weakness, weakness and headaches.  Endo/Heme/Allergies:  Does not bruise/bleed easily.  Psychiatric/Behavioral:  Negative for depression. The patient is not nervous/anxious and does not have insomnia.    PAST MEDICAL HISTORY :  Past Medical History:  Diagnosis Date   Arthritis    Collagen vascular disease ([place])    GERD (gastroesophageal reflux disease)    Headache(784.0)    History of methotrexate therapy    Hyperlipidemia    hx   Lymphadenopathy of head and neck 01/2015   see on Thyroid ultrasound   Lymphoma, mantle cell ([place]) 06/01/2015   bx of lymph node in right breast/Stage  IV Mantle Cell Lymphoma   Personal history of chemotherapy    Rheumatoid arthritis ([place])     PAST SURGICAL HISTORY :   Past Surgical History:  Procedure Laterality Date   BREAST BIOPSY Left 11/07/2020    u/s bx-hydromark #3 "coil"-path pending   CARDIAC CATHETERIZATION  09/2004   [facility]; EF 60%   CARDIAC CATHETERIZATION  08/2004   [facility]   IR FLUORO GUIDED NEEDLE PLC ASPIRATION/INJECTION LOC  06/18/2018   PERIPHERAL VASCULAR CATHETERIZATION N/A 07/04/2015   Procedure: [name] Insertion;  Surgeon: [name], MD;  Location: [facility];  Service: Cardiovascular;  Laterality: N/A;   PORTA CATH REMOVAL N/A 06/23/2018   Procedure: PORTA CATH REMOVAL;  Surgeon: [name], MD;  Location: [facility];  Service: Cardiovascular;  Laterality: N/A;   RIGHT/LEFT HEART CATH AND CORONARY ANGIOGRAPHY Bilateral 09/20/2019   Procedure: RIGHT/LEFT HEART CATH AND CORONARY ANGIOGRAPHY;  Surgeon: Nelva Bush, MD;  Location: [facility];  Service: Cardiovascular;  Laterality: Bilateral;    FAMILY HISTORY :   Family History  Problem Relation Age of Onset   Diabetes Mother    Cholelithiasis Mother    Hypertension Sister    Diabetes Sister    Heart murmur Sister    Arthritis Brother    Breast cancer Neg Hx     SOCIAL HISTORY:   Social History   Tobacco Use   Smoking status: Never   Smokeless tobacco: Never  Vaping Use   Vaping Use: Never used  Substance Use Topics   Alcohol use: No   Drug use: No    ALLERGIES:  has No Known Allergies.  MEDICATIONS:  Current Outpatient Medications  Medication Sig Dispense Refill   Acalabrutinib Maleate (CALQUENCE) 100 MG TABS Take 1 capsule (100 mg) by mouth in the morning and at bedtime. 60 tablet 2   acetaminophen (TYLENOL) 500 MG tablet Take 1,000 mg by mouth every 6 (six) hours as needed for mild pain.      albuterol (VENTOLIN HFA) 108 (90 Base) MCG/ACT inhaler TAKE 2 PUFFS BY MOUTH EVERY 6 HOURS AS NEEDED FOR WHEEZE OR SHORTNESS OF BREATH 18 each 1   carvedilol (COREG) 3.125 MG tablet TAKE 1 TABLET BY MOUTH TWICE A DAY 180 tablet 0   cyanocobalamin 2000 MCG tablet Take 2,500 mcg by mouth daily.      docusate sodium (COLACE) 100  MG capsule Take 100 mg by mouth 2 (two) times daily as needed for mild constipation.     fluticasone (FLONASE) 50 MCG/ACT nasal spray Place 2 sprays into both nostrils daily as needed for allergies. 16 g 4   furosemide (LASIX) 40 MG tablet TAKE 1 TABLET BY MOUTH EVERY DAY 90 tablet 0   losartan (COZAAR) 25 MG tablet TAKE 1 TABLET BY MOUTH EVERY DAY 90 tablet 1   MEGARED OMEGA-3 KRILL OIL PO Take 1 tablet by mouth daily.     meloxicam (MOBIC) 7.5 MG tablet TAKE 1 TABLET BY MOUTH EVERY DAY AS NEEDED FOR PAIN 30 tablet 1   Multiple Vitamins-Minerals (CENTRUM SILVER 50+WOMEN) TABS Take by mouth.     Multiple Vitamins-Minerals (HAIR SKIN AND NAILS FORMULA PO) Take by mouth.     ondansetron (ZOFRAN) 8 MG tablet Take 8 mg by mouth every 8 (eight) hours as needed for nausea or vomiting.     rosuvastatin (CRESTOR) 5 MG tablet Take 1 tablet (5 mg total) by mouth daily. 90 tablet 3   spironolactone (ALDACTONE) 25 MG  tablet Take 0.5 tablets (12.5 mg total) by mouth daily. 45 tablet 3   No current facility-administered medications for this visit.   Facility-Administered Medications Ordered in Other Visits  Medication Dose Route Frequency Provider Last Rate Last Admin   heparin lock flush 100 unit/mL  500 Units Intravenous Once Charlaine Dalton R, MD       sodium chloride flush (NS) 0.9 % injection 10 mL  10 mL Intravenous Once Charlaine Dalton R, MD       sodium chloride flush (NS) 0.9 % injection 10 mL  10 mL Intravenous Once Cammie Sickle, MD       Tbo-Filgrastim [facility]) injection 480 mcg  480 mcg Subcutaneous Once Cammie Sickle, MD        PHYSICAL EXAMINATION: ECOG PERFORMANCE STATUS: 0 - Asymptomatic  BP 102/67 (BP Location: Right Arm, Patient Position: Sitting, Cuff Size: Normal)    Pulse 72    Temp 97.7 F (36.5 C) (Tympanic)    Ht 5' 7.01" (1.702 m)    Wt 249 lb 6.4 oz (113.1 kg)    SpO2 100%    BMI 39.05 kg/m   Filed Weights   07/12/21 0840  Weight: 249 lb 6.4 oz  (113.1 kg)    Physical Exam HENT:     Head: Normocephalic and atraumatic.     Mouth/Throat:     Pharynx: No oropharyngeal exudate.  Eyes:     Pupils: Pupils are equal, round, and reactive to light.  Cardiovascular:     Rate and Rhythm: Normal rate and regular rhythm.  Pulmonary:     Effort: No respiratory distress.     Breath sounds: No wheezing.  Abdominal:     General: Bowel sounds are normal. There is no distension.     Palpations: Abdomen is soft. There is no mass.     Tenderness: There is no abdominal tenderness. There is no guarding or rebound.  Musculoskeletal:        General: No tenderness. Normal range of motion.     Cervical back: Normal range of motion and neck supple.  Skin:    General: Skin is warm.  Neurological:     Mental Status: She is alert and oriented to person, place, and time.  Psychiatric:        Mood and Affect: Affect normal.   LABORATORY DATA:  I have reviewed the data as listed    Component Value Date/Time   NA 136 07/12/2021 0802   NA 145 (H) 09/08/2019 1359   NA 142 09/14/2013 1127   K 4.1 07/12/2021 0802   K 3.5 09/14/2013 1127   CL 103 07/12/2021 0802   CL 110 (H) 09/14/2013 1127   CO2 27 07/12/2021 0802   CO2 29 09/14/2013 1127   GLUCOSE 101 (H) 07/12/2021 0802   GLUCOSE 106 (H) 09/14/2013 1127   BUN 16 07/12/2021 0802   BUN 15 09/08/2019 1359   BUN 8 09/14/2013 1127   CREATININE 0.88 07/12/2021 0802   CREATININE 0.71 09/14/2013 1127   CREATININE 0.68 04/01/2013 1550   CALCIUM 8.8 (L) 07/12/2021 0802   CALCIUM 8.6 09/14/2013 1127   PROT 6.7 07/12/2021 0802   PROT 7.6 09/14/2013 1127   ALBUMIN 3.6 07/12/2021 0802   ALBUMIN 3.2 (L) 09/14/2013 1127   AST 19 07/12/2021 0802   AST 22 09/14/2013 1127   ALT 11 07/12/2021 0802   ALT 19 09/14/2013 1127   ALKPHOS 65 07/12/2021 0802   ALKPHOS 76 09/14/2013 1127   BILITOT  0.8 07/12/2021 0802   BILITOT 0.4 09/14/2013 1127   GFRNONAA >60 07/12/2021 0802   GFRNONAA >60 09/14/2013 1127    GFRAA >60 01/20/2020 1303   GFRAA >60 09/14/2013 1127    No results found for: SPEP, UPEP  Lab Results  Component Value Date   WBC 7.5 07/12/2021   NEUTROABS 5.5 07/12/2021   HGB 12.9 07/12/2021   HCT 40.3 07/12/2021   MCV 93.9 07/12/2021   PLT 201 07/12/2021      Chemistry      Component Value Date/Time   NA 136 07/12/2021 0802   NA 145 (H) 09/08/2019 1359   NA 142 09/14/2013 1127   K 4.1 07/12/2021 0802   K 3.5 09/14/2013 1127   CL 103 07/12/2021 0802   CL 110 (H) 09/14/2013 1127   CO2 27 07/12/2021 0802   CO2 29 09/14/2013 1127   BUN 16 07/12/2021 0802   BUN 15 09/08/2019 1359   BUN 8 09/14/2013 1127   CREATININE 0.88 07/12/2021 0802   CREATININE 0.71 09/14/2013 1127   CREATININE 0.68 04/01/2013 1550      Component Value Date/Time   CALCIUM 8.8 (L) 07/12/2021 0802   CALCIUM 8.6 09/14/2013 1127   ALKPHOS 65 07/12/2021 0802   ALKPHOS 76 09/14/2013 1127   AST 19 07/12/2021 0802   AST 22 09/14/2013 1127   ALT 11 07/12/2021 0802   ALT 19 09/14/2013 1127   BILITOT 0.8 07/12/2021 0802   BILITOT 0.4 09/14/2013 1127       RADIOGRAPHIC STUDIES: I have personally reviewed the radiological images as listed and agreed with the findings in the report. No results found.   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 Currently on Calquence and maintenance rituximab- monthly x 3/8 [ and then every 2 months x 2  Years] February 2023-complete response;  No evidence non-Hodgkin's lymphoma/mantle cell recurrence or progression   # Continue Calequence 100 mg twice a day x AUG 1st, 2022.  Continue Rituximab monthly #3 of planned #8; and then q2M. Tolerating  treatment extremely well.   # Left shoulder pain/ Chronic arthritis-on meloxicam as needed- STABLE.   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   # Pulmonary hypertension/chronic bilateral lung scarring- [s/p pulmonary evaluation]-recommend compliance with albuterol.; STABLE.   # Constipation:  continue dulcolax prn- STABLE.   # DISPOSITION: fridays pt pref-  # Rituaxn today # follow up-MD in 4 weeks [ MD ;labs- cbc/cmp/ldh/BNP -Rituxan IV;.Dr.B  # I reviewed the blood work- with the patient in detail; also reviewed the imaging independently [as summarized above]; and with the patient in detail.           Orders Placed This Encounter  Procedures   Comprehensive metabolic panel    Standing Status:   Future    Standing Expiration Date:   07/12/2022   Lactate dehydrogenase    Standing Status:   Future    Standing Expiration Date:   07/12/2022   Brain natriuretic peptide    Standing Status:   Future    Standing Expiration Date:   07/12/2022  All questions were answered. The patient knows to call the clinic with any problems, questions or concerns.      Cammie Sickle, MD 07/12/2021 12:49 PM

## 2021-07-12 NOTE — Progress Notes (Signed)
Pt states she has recently been off balance and dizzy.  Had PET scan since last visit.

## 2021-07-15 ENCOUNTER — Other Ambulatory Visit (HOSPITAL_COMMUNITY): Payer: Self-pay

## 2021-07-15 ENCOUNTER — Other Ambulatory Visit: Payer: Self-pay | Admitting: Internal Medicine

## 2021-07-15 DIAGNOSIS — C8311 Mantle cell lymphoma, lymph nodes of head, face, and neck: Secondary | ICD-10-CM

## 2021-07-15 MED ORDER — CALQUENCE 100 MG PO TABS
100.0000 mg | ORAL_TABLET | Freq: Two times a day (BID) | ORAL | 2 refills | Status: DC
  Filled 2021-07-15: qty 60, 30d supply, fill #0
  Filled 2021-08-14: qty 60, 30d supply, fill #1
  Filled 2021-09-17: qty 60, 30d supply, fill #2

## 2021-07-15 NOTE — Telephone Encounter (Signed)
CBC with Differential/Platelet Order: [id number] Status: Final result    Visible to patient: Yes (not seen)    Next appt: 07/17/2021 at 04:00 PM in Cardiology Harrell Gave End, MD)    Dx: Mantle cell lymphoma of lymph nodes o...    0 Result Notes           Component Ref Range & Units 3 d ago (07/12/21) 1 mo ago (06/14/21) 1 mo ago (05/17/21) 2 mo ago (04/19/21) 3 mo ago (03/22/21) 4 mo ago (03/15/21) 4 mo ago (03/08/21)  WBC 4.0 - 10.5 K/uL 7.5  7.5  6.6  7.0  8.4  6.9  7.6   RBC 3.87 - 5.11 MIL/uL 4.29  4.33  4.49  4.44  4.29  4.31  4.36   Hemoglobin 12.0 - 15.0 g/dL 12.9  13.1  13.5  13.5  13.1  13.0  13.1   HCT 36.0 - 46.0 % 40.3  40.6  41.8  41.8  40.3  40.6  40.7   MCV 80.0 - 100.0 fL 93.9  93.8  93.1  94.1  93.9  94.2  93.3   MCH 26.0 - 34.0 pg 30.1  30.3  30.1  30.4  30.5  30.2  30.0   MCHC 30.0 - 36.0 g/dL 32.0  32.3  32.3  32.3  32.5  32.0  32.2   RDW 11.5 - 15.5 % 13.8  13.5  13.8  14.1  14.9  15.0  15.1   Platelets 150 - 400 K/uL 201  190  204  213  186  190  182   nRBC 0.0 - 0.2 % 0.0  0.0  0.0  0.0  0.0  0.0  0.0   Neutrophils Relative % % 73  72  72  75  75  68  72   Neutro Abs 1.7 - 7.7 K/uL 5.5  5.5  4.8  5.2  6.4  4.7  5.4   Lymphocytes Relative % 17  18  18  16  15  21  19    Lymphs Abs 0.7 - 4.0 K/uL 1.2  1.4  1.2  1.1  1.2  1.4  1.4   Monocytes Relative % 7  7  7  7  7  8  7    Monocytes Absolute 0.1 - 1.0 K/uL 0.5  0.5  0.4  0.5  0.6  0.5  0.5   Eosinophils Relative % 2  2  1  2  2  3  2    Eosinophils Absolute 0.0 - 0.5 K/uL 0.1  0.1  0.1  0.1  0.1  0.2  0.1   Basophils Relative % 0  0  1  0  0  0  0   Basophils Absolute 0.0 - 0.1 K/uL 0.0  0.0  0.0  0.0  0.0  0.0  0.0   Immature Granulocytes % 1  1  1   0  1  0  0   Abs Immature Granulocytes 0.00 - 0.07 K/uL 0.04  0.04 CM  0.08 High  CM  0.03 CM  0.04 CM  0.03 CM  0.03 CM   Comment: Performed at [facility], [address] [place], [place] [address]  Resulting Agency  [facility] CLIN LAB [place] CLIN LAB [place] CLIN LAB  [place] CLIN LAB [place] CLIN LAB [place] CLIN LAB [facility] CLIN LAB         Specimen Collected: 07/12/21 08:02 Last Resulted: 07/12/21 08:21      Lab Flowsheet  Order Details    View Encounter    Lab and Collection Details    Routing    Result History    View Encounter Conversation      CM=Additional comments      Result Care Coordination   Patient Communication   Add Comments   Add Notifications  Back to Top       Other Results from 07/12/2021  Brain natriuretic peptide Order: [id number] Status: Final result    Visible to patient: Yes (not seen)    Next appt: 07/17/2021 at 04:00 PM in Cardiology Harrell Gave End, MD)    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    0 Result Notes       Component Ref Range & Units 3 d ago 1 mo ago 2 mo ago  B Natriuretic Peptide 0.0 - 100.0 pg/mL 38.7  54.4 CM  37.8 CM   Comment: Performed at [facility], [address] [place], [place] [address]  Resulting Agency  [facility] CLIN LAB [facility] CLIN LAB [facility] CLIN LAB         Specimen Collected: 07/12/21 08:02 Last Resulted: 07/12/21 09:34      Lab Flowsheet    Order Details    View Encounter    Lab and Collection Details    Routing    Result History    View Encounter Conversation      CM=Additional comments      Result Care Coordination   Patient Communication   Add Comments   Add Notifications  Back to Top         Lactate dehydrogenase Order: [id number] Status: Final result    Visible to patient: Yes (not seen)    Next appt: 07/17/2021 at 04:00 PM in Cardiology Harrell Gave End, MD)    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    0 Result Notes           Component Ref Range & Units 3 d ago (07/12/21) 1 mo ago (05/17/21) 2 mo ago (04/19/21) 3 mo ago (03/22/21) 4 mo ago (03/08/21) 4 mo ago (02/22/21) 5 mo ago (02/11/21)  LDH 98 - 192 U/L 157  157 CM  167 CM  157 CM  162 CM  176 CM  160 CM   Comment: Performed at [facility], [address] [place], [place] [address]   Resulting Agency  [facility] CLIN LAB [place] CLIN LAB [place] CLIN LAB [place] CLIN LAB [place] CLIN LAB [place] CLIN LAB [place] CLIN LAB         Specimen Collected: 07/12/21 08:02 Last Resulted: 07/12/21 08:41      Lab Flowsheet    Order Details    View Encounter    Lab and Collection Details    Routing    Result History    View Encounter Conversation      CM=Additional comments      Result Care Coordination   Patient Communication   Add Comments   Add Notifications  Back to Top          Contains abnormal data Comprehensive metabolic panel Order: [id number] Status: Final result    Visible to patient: Yes (not seen)    Next appt: 07/17/2021 at 04:00 PM in Cardiology Harrell Gave End, MD)    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    0 Result Notes           Component Ref Range & Units 3 d ago (07/12/21) 1 mo ago (  06/14/21) 1 mo ago (06/14/21) 1 mo ago (05/17/21) 2 mo ago (04/19/21) 3 mo ago (03/22/21) 4 mo ago (03/15/21)  Sodium 135 - 145 mmol/L 136   132 Low   138  138  135  136   Potassium 3.5 - 5.1 mmol/L 4.1   3.7  3.8  4.5  4.0  4.8   Chloride 98 - 111 mmol/L 103   99  107  100  100  101   CO2 22 - 32 mmol/L 27   27  26  29  30  28    Glucose, Bld 70 - 99 mg/dL 101 High    97 CM  102 High  CM  106 High  CM  103 High  CM  98 CM   Comment: Glucose reference range applies only to samples taken after fasting for at least 8 hours.  BUN 8 - 23 mg/dL 16   19  15  19  24  High   19   Creatinine, Ser 0.44 - 1.00 mg/dL 0.88   0.78  0.78  0.80  0.79  0.68   Calcium 8.9 - 10.3 mg/dL 8.8 Low    8.4 Low   8.7 Low   8.9  8.7 Low   8.8 Low    Total Protein 6.5 - 8.1 g/dL 6.7  6.5   6.8  6.7     Albumin 3.5 - 5.0 g/dL 3.6  3.7   3.7  3.7     AST 15 - 41 U/L 19  22   20  20      ALT 0 - 44 U/L 11  11   12  13      Alkaline Phosphatase 38 - 126 U/L 65  67   67  68     Total Bilirubin 0.3 - 1.2 mg/dL 0.8  0.8   0.6  0.7     GFR, Estimated >60 mL/min >60   >60 CM  >60 CM  >60 CM  >60 CM  >60 CM   Comment:  (NOTE)  Calculated using the CKD-EPI Creatinine Equation (2021)   Anion gap 5 - 15 6   6  CM  5 CM  9 CM  5 CM  7 CM   Comment: Performed at [facility], [address] [place], [place]  [address]  Resulting Agency  [facility] CLIN LAB [place] CLIN LAB [place] CLIN LAB [place] CLIN LAB [place] CLIN LAB [place] CLIN LAB [place] CLIN LAB         Specimen Collected: 07/12/21 08:02 Last Resulted: 07/12/21 08:41

## 2021-07-17 ENCOUNTER — Other Ambulatory Visit: Payer: Self-pay

## 2021-07-17 ENCOUNTER — Ambulatory Visit: Payer: Medicare HMO | Admitting: Internal Medicine

## 2021-07-17 ENCOUNTER — Encounter: Payer: Self-pay | Admitting: Internal Medicine

## 2021-07-17 ENCOUNTER — Other Ambulatory Visit (HOSPITAL_COMMUNITY): Payer: Self-pay

## 2021-07-17 VITALS — BP 100/66 | HR 62 | Ht 67.0 in | Wt 245.0 lb

## 2021-07-17 DIAGNOSIS — Z79899 Other long term (current) drug therapy: Secondary | ICD-10-CM | POA: Diagnosis not present

## 2021-07-17 DIAGNOSIS — I5022 Chronic systolic (congestive) heart failure: Secondary | ICD-10-CM | POA: Diagnosis not present

## 2021-07-17 DIAGNOSIS — I429 Cardiomyopathy, unspecified: Secondary | ICD-10-CM

## 2021-07-17 DIAGNOSIS — I428 Other cardiomyopathies: Secondary | ICD-10-CM

## 2021-07-17 DIAGNOSIS — I447 Left bundle-branch block, unspecified: Secondary | ICD-10-CM

## 2021-07-17 MED ORDER — SPIRONOLACTONE 25 MG PO TABS: 25.0000 mg | ORAL_TABLET | Freq: Every day | ORAL | 2 refills | Status: DC

## 2021-07-17 NOTE — Patient Instructions (Signed)
Medication Instructions:   Your physician has recommended you make the following change in your medication:  INCREASE Spironolactone 25 mg daily   *If you need a refill on your cardiac medications before your next appointment, please call your pharmacy*   Lab Work:  Your physician recommends that you return for lab work in: [place] (BMET)    - This lab does not require fasting  Testing/Procedures:  None ordered   Follow-Up: At [facility], you and your health needs are our priority.  As part of our continuing mission to provide you with exceptional heart care, we have created designated Provider Care Teams.  These Care Teams include your primary Cardiologist (physician) and Advanced Practice Providers (APPs -  Physician Assistants and Nurse Practitioners) who all work together to provide you with the care you need, when you need it.  We recommend signing up for the patient portal called "MyChart".  Sign up information is provided on this After Visit Summary.  MyChart is used to connect with patients for Virtual Visits (Telemedicine).  Patients are able to view lab/test results, encounter notes, upcoming appointments, etc.  Non-urgent messages can be sent to your provider as well.   To learn more about what you can do with MyChart, go to [email or web address].    Your next appointment:    6 month(s) in the afternoon  The format for your next appointment:   In Person  Provider:   You may see Nelva Bush, MD or one of the following Advanced Practice Providers on your designated Care Team:   Murray Hodgkins, NP Christell Faith, PA-C Cadence Kathlen Mody, [place]

## 2021-07-17 NOTE — Progress Notes (Signed)
Follow-up Outpatient Visit Date: 07/17/2021  Primary Care Provider: Burnard Hawthorne, FNP [address] [place] [id number]  Chief Complaint: Follow-up HFrEF  HPI:  Michelle [name] is a 81 y.o. 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  She was last seen in our office by Michelle Faith, PA, in 12/2020, at which time she was doing well from a heart standpoint.  Preceding echo showed improvement in LVEF from a nadir of 30-35% in 08/2019 to 40-45%.  Relative hypotension precluded escalation of her goal-directed medical therapy consisting of carvedilol, losartan, and spironolactone.  Today, Michelle [name] reports that she is feeling fairly well.  She has stable exertional dyspnea when walking across a parking lot.  She also has occasional orthostatic lightheadedness, especially when she gets up too quickly.  She has not fallen or passed out.  She denies chest pain, palpitations, orthopnea, and edema.  She is tolerating her medications well but has found it difficult to split spironolactone tablets in half.  She has therefore been taking spironolactone 25 mg every other day rather than 12.5 mg daily.  She and Dr. Caryl Comes previously agreed to defer CRT given fairly mild symptoms as well as improving LVEF.  She continues to receive infusions for management of her lymphoma through the cancer center.  --------------------------------------------------------------------------------------------------  Past Medical History:  Diagnosis Date   Arthritis    Collagen vascular disease ([place])    GERD (gastroesophageal reflux disease)    Headache(784.0)    HFrEF (heart failure with reduced ejection fraction) (HCC)    Nonischemic cardiomyopathy, LVEF as low as 30-35% in 08/2019   History of methotrexate therapy    Hyperlipidemia    hx   Lymphadenopathy of head and neck  01/2015   see on Thyroid ultrasound   Lymphoma, mantle cell ([place]) 06/01/2015   bx of lymph node in right breast/Stage IV Mantle Cell Lymphoma   Personal history of chemotherapy    Rheumatoid arthritis [facility])    Past Surgical History:  Procedure Laterality Date   BREAST BIOPSY Left 11/07/2020   u/s bx-hydromark #3 "coil"-path pending   CARDIAC CATHETERIZATION  09/2004   [facility]; EF 60%   CARDIAC CATHETERIZATION  08/2004   [facility]   IR FLUORO GUIDED NEEDLE PLC ASPIRATION/INJECTION LOC  06/18/2018   PERIPHERAL VASCULAR CATHETERIZATION N/A 07/04/2015   Procedure: [name] Insertion;  Surgeon: [name], MD;  Location: [place] [facility];  Service: Cardiovascular;  Laterality: N/A;   PORTA CATH REMOVAL N/A 06/23/2018   Procedure: PORTA CATH REMOVAL;  Surgeon: [name], MD;  Location: [place] [facility];  Service: Cardiovascular;  Laterality: N/A;   RIGHT/LEFT HEART CATH AND CORONARY ANGIOGRAPHY Bilateral 09/20/2019   Procedure: RIGHT/LEFT HEART CATH AND CORONARY ANGIOGRAPHY;  Surgeon: Nelva Bush, MD;  Location: [place] [facility];  Service: Cardiovascular;  Laterality: Bilateral;    Recent CV Pertinent Labs: Lab Results  Component Value Date   CHOL 151 10/12/2020   HDL 64.70 10/12/2020   LDLCALC 77 10/12/2020   TRIG 44.0 10/12/2020   CHOLHDL 2 10/12/2020   INR 1.0 09/08/2019   BNP 38.7 07/12/2021   K 4.1 07/12/2021   K 3.5 09/14/2013   BUN 16 07/12/2021   BUN 15 09/08/2019   BUN 8 09/14/2013   CREATININE 0.88 07/12/2021   CREATININE 0.71 09/14/2013   CREATININE 0.68 04/01/2013    Past medical and  surgical history were reviewed and updated in EPIC.  Current Meds  Medication Sig   Acalabrutinib Maleate (CALQUENCE) 100 MG TABS Take 1 tablet (100 mg) by mouth in the morning and at bedtime.   acetaminophen (TYLENOL) 500 MG tablet Take 1,000 mg by mouth every 6 (six) hours as needed for mild pain.    albuterol (VENTOLIN HFA) 108 (90 Base) MCG/ACT inhaler TAKE 2 PUFFS BY  MOUTH EVERY 6 HOURS AS NEEDED FOR WHEEZE OR SHORTNESS OF BREATH   carvedilol (COREG) 3.125 MG tablet TAKE 1 TABLET BY MOUTH TWICE A DAY   cyanocobalamin 2000 MCG tablet Take 2,500 mcg by mouth daily.    docusate sodium (COLACE) 100 MG capsule Take 100 mg by mouth 2 (two) times daily as needed for mild constipation.   fluticasone (FLONASE) 50 MCG/ACT nasal spray PLACE 2 SPRAYS INTO BOTH NOSTRILS DAILY AS NEEDED FOR ALLERGIES.   furosemide (LASIX) 40 MG tablet TAKE 1 TABLET BY MOUTH EVERY DAY   losartan (COZAAR) 25 MG tablet TAKE 1 TABLET BY MOUTH EVERY DAY   MEGARED OMEGA-3 KRILL OIL PO Take 1 tablet by mouth daily.   meloxicam (MOBIC) 7.5 MG tablet TAKE 1 TABLET BY MOUTH EVERY DAY AS NEEDED FOR PAIN   Multiple Vitamins-Minerals (CENTRUM SILVER 50+WOMEN) TABS Take 1 tablet by mouth daily.   Multiple Vitamins-Minerals (HAIR SKIN AND NAILS FORMULA PO) Take 1 tablet by mouth daily.   ondansetron (ZOFRAN) 8 MG tablet Take 8 mg by mouth every 8 (eight) hours as needed for nausea or vomiting.   rosuvastatin (CRESTOR) 5 MG tablet Take 1 tablet (5 mg total) by mouth daily.   [DISCONTINUED] spironolactone (ALDACTONE) 25 MG tablet Take 25 mg by mouth every other day.    Allergies: Patient has no known allergies.  Social History   Tobacco Use   Smoking status: Never   Smokeless tobacco: Never  Vaping Use   Vaping Use: Never used  Substance Use Topics   Alcohol use: No   Drug use: No    Family History  Problem Relation Age of Onset   Diabetes Mother    Cholelithiasis Mother    Hypertension Sister    Diabetes Sister    Heart murmur Sister    Arthritis Brother    Breast cancer Neg Hx     Review of Systems: A 12-system review of systems was performed and was negative except as noted in the HPI.  --------------------------------------------------------------------------------------------------  Physical Exam: BP 100/66 (BP Location: Right Arm, Patient Position: Sitting, Cuff Size:  Large)    Pulse 62    Ht 5\' 7"  (1.702 m)    Wt 245 lb (111.1 kg)    SpO2 98%    BMI 38.37 kg/m   General:  NAD. Neck: No JVD or HJR. Lungs: Clear to auscultation bilaterally without wheezes or crackles. Heart: Regular rate and rhythm without murmurs, rubs, or gallops. Abdomen: Soft, nontender, nondistended. Extremities: No lower extremity edema.  EKG: Normal sinus rhythm with left axis deviation and left bundle branch block.  No significant change from prior tracing on 01/10/2021.  Lab Results  Component Value Date   WBC 7.5 07/12/2021   HGB 12.9 07/12/2021   HCT 40.3 07/12/2021   MCV 93.9 07/12/2021   PLT 201 07/12/2021    Lab Results  Component Value Date   NA 136 07/12/2021   K 4.1 07/12/2021   CL 103 07/12/2021   CO2 27 07/12/2021   BUN 16 07/12/2021   CREATININE 0.88 07/12/2021  GLUCOSE 101 (H) 07/12/2021   ALT 11 07/12/2021    Lab Results  Component Value Date   CHOL 151 10/12/2020   HDL 64.70 10/12/2020   LDLCALC 77 10/12/2020   TRIG 44.0 10/12/2020   CHOLHDL 2 10/12/2020    --------------------------------------------------------------------------------------------------  ASSESSMENT AND PLAN: Chronic 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  She appears euvolemic on examination today.  She is tolerating her current medications fairly well though borderline low blood pressure and intermittent orthostatic lightheadedness preclude escalation today.  In an effort to simplify her dosing, we will switch spironolactone to 25 mg daily.  I will check a BMP in about 1 week to ensure stable potassium and kidney function.  I advised her to contact us if she has worsening lightheadedness or notices her blood pressure decreased further.  Follow-up: Return to clinic in 6 months.  Nelva Bush, MD 07/18/2021 7:01 AM

## 2021-07-18 ENCOUNTER — Encounter: Payer: Self-pay | Admitting: Internal Medicine

## 2021-07-18 DIAGNOSIS — I5022 Chronic systolic (congestive) heart failure: Secondary | ICD-10-CM | POA: Insufficient documentation

## 2021-07-19 ENCOUNTER — Other Ambulatory Visit: Payer: Self-pay

## 2021-07-19 ENCOUNTER — Telehealth: Payer: Self-pay | Admitting: Internal Medicine

## 2021-07-19 ENCOUNTER — Telehealth: Payer: Self-pay | Admitting: Family

## 2021-07-19 NOTE — Telephone Encounter (Signed)
Noted. Will follow up lab results in chart.

## 2021-07-19 NOTE — Telephone Encounter (Signed)
Pt called in stating that her cardiologist called our office to speak with NP. Arnett Nurse. Pt stated that cardiologist gave her the ok to get her BMET blood draw done here at our office. No labs in system.

## 2021-07-19 NOTE — Telephone Encounter (Signed)
Pt has appointment the 22nd 4 days from her starting Spironolactone. She wanted to have BMP then but I feared this was too early. I told her I would clarify. She doesn't want two trips due to her working.

## 2021-07-19 NOTE — Telephone Encounter (Signed)
Can we move to approx 7? Does that work with her work?

## 2021-07-19 NOTE — Telephone Encounter (Signed)
Patient is calling to have her BMET lab done with her PCP on 07/24/21

## 2021-07-19 NOTE — Telephone Encounter (Signed)
Yes okay Please sch and order BMP.  This needs to be scheduled 1 week after she starts spironolactone 25 mg per Dr. Darnelle Bos note

## 2021-07-22 NOTE — Telephone Encounter (Signed)
Patients appointment has been changes to Friday so that she can have BMP done.

## 2021-07-24 ENCOUNTER — Ambulatory Visit: Payer: Medicare HMO | Admitting: Family

## 2021-07-25 ENCOUNTER — Other Ambulatory Visit (HOSPITAL_COMMUNITY): Payer: Self-pay

## 2021-07-26 ENCOUNTER — Ambulatory Visit (INDEPENDENT_AMBULATORY_CARE_PROVIDER_SITE_OTHER): Payer: Medicare HMO | Admitting: Family

## 2021-07-26 ENCOUNTER — Other Ambulatory Visit: Payer: Self-pay

## 2021-07-26 ENCOUNTER — Encounter: Payer: Self-pay | Admitting: Family

## 2021-07-26 VITALS — BP 120/80 | HR 65 | Temp 98.2°F | Ht 67.0 in | Wt 245.6 lb

## 2021-07-26 DIAGNOSIS — I5022 Chronic systolic (congestive) heart failure: Secondary | ICD-10-CM

## 2021-07-26 DIAGNOSIS — G4733 Obstructive sleep apnea (adult) (pediatric): Secondary | ICD-10-CM | POA: Diagnosis not present

## 2021-07-26 LAB — BASIC METABOLIC PANEL
BUN: 17 mg/dL (ref 6–23)
CO2: 34 mEq/L — ABNORMAL HIGH (ref 19–32)
Calcium: 8.4 mg/dL (ref 8.4–10.5)
Chloride: 103 mEq/L (ref 96–112)
Creatinine, Ser: 0.92 mg/dL (ref 0.40–1.20)
GFR: 59.64 mL/min — ABNORMAL LOW (ref >60.00)
Glucose, Bld: 86 mg/dL (ref 70–99)
Potassium: 3.3 mEq/L — ABNORMAL LOW (ref 3.5–5.1)
Sodium: 142 mEq/L (ref 135–145)

## 2021-07-26 NOTE — Assessment & Plan Note (Signed)
Recently noncompliant with CPAP.  We discussed most likely this is contributing to her fatigue, in addition to chemo.  She will resume CPAP and if fatigue does not resolve to baseline, she will let me know.

## 2021-07-26 NOTE — Assessment & Plan Note (Signed)
Chronic, appears stable today.  She does not appear volume overloaded.  Discussed briefly cardiac rehab and she politely declines at this time as she would prefer to walk more at home such as to her mailbox and back.  Continue spironolactone 25 mg daily, carvedilol 3.125 mg twice daily, Lasix 40 mg daily, losartan 25 mg daily.  Pending BMP today

## 2021-07-26 NOTE — Patient Instructions (Signed)
As discussed, I suspect your fatigue is largely in part to not wearing your CPAP machine.  Once you start wearing your CPAP machine if you continue to feel so tired, please let me know so we can start further evaluation.  As always, so nice to see you!

## 2021-07-26 NOTE — Addendum Note (Signed)
Addended by: Cheri Rous E on: 07/26/2021 12:06 PM   Modules accepted: Orders
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
## 2021-07-29 ENCOUNTER — Other Ambulatory Visit: Payer: Self-pay

## 2021-07-29 DIAGNOSIS — I5022 Chronic systolic (congestive) heart failure: Secondary | ICD-10-CM

## 2021-07-30 NOTE — Telephone Encounter (Signed)
Pt scheduled for repeat BMP this Friday with PCP. Will follow up with results and forward to Dr. Saunders Revel.

## 2021-08-02 ENCOUNTER — Inpatient Hospital Stay: Payer: Medicare HMO | Attending: Internal Medicine | Admitting: Hospice and Palliative Medicine

## 2021-08-02 ENCOUNTER — Other Ambulatory Visit: Payer: Self-pay

## 2021-08-02 ENCOUNTER — Other Ambulatory Visit: Payer: Self-pay | Admitting: Family

## 2021-08-02 ENCOUNTER — Other Ambulatory Visit: Payer: Self-pay | Admitting: Internal Medicine

## 2021-08-02 ENCOUNTER — Telehealth: Payer: Self-pay | Admitting: *Deleted

## 2021-08-02 ENCOUNTER — Other Ambulatory Visit: Payer: Medicare HMO

## 2021-08-02 ENCOUNTER — Other Ambulatory Visit: Payer: Self-pay | Admitting: Physician Assistant

## 2021-08-02 DIAGNOSIS — Z03818 Encounter for observation for suspected exposure to other biological agents ruled out: Secondary | ICD-10-CM | POA: Diagnosis not present

## 2021-08-02 DIAGNOSIS — U071 COVID-19: Secondary | ICD-10-CM

## 2021-08-02 DIAGNOSIS — E785 Hyperlipidemia, unspecified: Secondary | ICD-10-CM

## 2021-08-02 DIAGNOSIS — J40 Bronchitis, not specified as acute or chronic: Secondary | ICD-10-CM

## 2021-08-02 DIAGNOSIS — Z20822 Contact with and (suspected) exposure to covid-19: Secondary | ICD-10-CM | POA: Diagnosis not present

## 2021-08-02 MED ORDER — MOLNUPIRAVIR EUA 200MG CAPSULE: 4.0000 | ORAL_CAPSULE | Freq: Two times a day (BID) | ORAL | 0 refills | Status: AC

## 2021-08-02 MED ORDER — GUAIFENESIN-CODEINE 100-10 MG/5ML PO SOLN
5.0000 mL | Freq: Three times a day (TID) | ORAL | 0 refills | Status: DC | PRN
Start: 2021-08-02 — End: 2021-08-16

## 2021-08-02 MED ORDER — ALBUTEROL SULFATE HFA 108 (90 BASE) MCG/ACT IN AERS: INHALATION_SPRAY | RESPIRATORY_TRACT | 1 refills | Status: DC

## 2021-08-02 NOTE — Telephone Encounter (Signed)
Pt contacted and scheduled for telephone/virtual visit this afternoon.  ?

## 2021-08-02 NOTE — Telephone Encounter (Signed)
Noted  

## 2021-08-02 NOTE — Telephone Encounter (Signed)
Patient called reporting that she has tested positive for COVID and has a lot of congestion, she is coughing. She is asking if she needs to reschedule her appointments 3/10 or not. Please advise, I told her that doctor may want her on treatment for COVID and would call if so ?

## 2021-08-02 NOTE — Telephone Encounter (Signed)
Pt COVID positive, see phone note today. Labs r/s to 3/10. Will follow up.  ?

## 2021-08-02 NOTE — Progress Notes (Signed)
Virtual Visit via Telephone Note ? ?I connected with Michelle [name] on 08/02/21 at  1:30 PM EST by telephone and verified that I am speaking with the correct person using two identifiers. ? ?Location: ?Patient: Home ?Provider: Clinic ?  ?I discussed the limitations, risks, security and privacy concerns of performing an evaluation and management service by telephone and the availability of in person appointments. I also discussed with the patient that there may be a patient responsible charge related to this service. The patient expressed understanding and agreed to proceed. ? ? 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 ?  ?Patient requested virtual visit today after testing positive for COVID ? ?Observations/Objective: ?Patient reports that her husband had COVID earlier in the week.  Patient is felt general malaise for about a week but last night started having worsening cough and some wheezing/exertional shortness of breath.  She denies fever but has had some chills.  She denies GI symptoms.  No rhinorrhea or sore throat.  She reports testing positive for COVID this morning. ? ?She is vaccinated. ? ?Patient denies significant shortness of breath at present but is still having a severe nonproductive cough. ? ?Assessment and Plan: ?COVID -patient is at high risk of complications given her cardiac and pulmonary comorbidities in addition to being immunosuppressed with lymphoma/chemotherapy.  We discussed initiating outpatient treatment with molnupiravir and supportive care with antitussive/acetaminophen/albuterol/fluids/rest.  Recommend quarantining until asymptomatic.  I recommended that she obtain a pulse oximeter and monitor her home SPO2.  I would have a low threshold for evaluation in the ER for any worsening symptoms and she  verbalized understanding with the ER precautions/triggers. ? ?Case and plan discussed with Dr. Rogue Bussing ? ?Follow Up Instructions: ?Follow-up with Dr. Rogue Bussing on 08/09/2021 or sooner if needed ?  ?I discussed the assessment and treatment plan with the patient. The patient was provided an opportunity to ask questions and all were answered. The patient agreed with the plan and demonstrated an understanding of the instructions. ?  ?The patient was advised to call back or seek an in-person evaluation if the symptoms worsen or if the condition fails to improve as anticipated. ? ?I provided 10 minutes of non-face-to-face time during this encounter. ? ? ?Irean Hong, NP ? ? ?

## 2021-08-05 ENCOUNTER — Encounter: Payer: Self-pay | Admitting: Internal Medicine

## 2021-08-06 ENCOUNTER — Telehealth: Payer: Self-pay | Admitting: *Deleted

## 2021-08-06 NOTE — Telephone Encounter (Signed)
Patient called asking if she should reschedule her appointment for this Friday since she has COVID diagnosed this past Friday and is currently on treatment for it. Please advise ?

## 2021-08-07 ENCOUNTER — Other Ambulatory Visit: Payer: Medicare HMO

## 2021-08-07 DIAGNOSIS — Z03818 Encounter for observation for suspected exposure to other biological agents ruled out: Secondary | ICD-10-CM | POA: Diagnosis not present

## 2021-08-07 DIAGNOSIS — Z20822 Contact with and (suspected) exposure to covid-19: Secondary | ICD-10-CM | POA: Diagnosis not present

## 2021-08-09 ENCOUNTER — Ambulatory Visit: Payer: Medicare HMO | Admitting: Internal Medicine

## 2021-08-09 ENCOUNTER — Ambulatory Visit: Payer: Medicare HMO

## 2021-08-09 ENCOUNTER — Other Ambulatory Visit: Payer: Medicare HMO

## 2021-08-13 NOTE — Telephone Encounter (Signed)
Called pt to follow up re BMET to be completed 1 week after starting spironolactone.  ? ?Pt still has COVID symptoms present, with chills/body ache and cough present.  ?Pt states that since she has had to move lab appt out further with PCP that she will be willing to have BMET in our office after she has completely recovered and no symptoms present.  ? ?Notified pt I will follow up with her next week.  ?

## 2021-08-14 ENCOUNTER — Other Ambulatory Visit (HOSPITAL_COMMUNITY): Payer: Self-pay

## 2021-08-14 ENCOUNTER — Other Ambulatory Visit: Payer: Self-pay | Admitting: *Deleted

## 2021-08-14 DIAGNOSIS — C8311 Mantle cell lymphoma, lymph nodes of head, face, and neck: Secondary | ICD-10-CM

## 2021-08-15 ENCOUNTER — Other Ambulatory Visit (HOSPITAL_COMMUNITY): Payer: Self-pay

## 2021-08-16 ENCOUNTER — Other Ambulatory Visit: Payer: Self-pay

## 2021-08-16 ENCOUNTER — Ambulatory Visit
Admission: RE | Admit: 2021-08-16 | Discharge: 2021-08-16 | Disposition: A | Discharge status: Home / Self Care | Payer: Medicare HMO | Source: Ambulatory Visit | Attending: Hospice and Palliative Medicine | Admitting: Hospice and Palliative Medicine

## 2021-08-16 ENCOUNTER — Ambulatory Visit
Admission: RE | Admit: 2021-08-16 | Discharge: 2021-08-16 | Disposition: A | Discharge status: Home / Self Care | Payer: Medicare HMO | Attending: Hospice and Palliative Medicine | Admitting: Hospice and Palliative Medicine

## 2021-08-16 ENCOUNTER — Inpatient Hospital Stay (HOSPITAL_BASED_OUTPATIENT_CLINIC_OR_DEPARTMENT_OTHER): Payer: Medicare HMO | Admitting: Hospice and Palliative Medicine

## 2021-08-16 ENCOUNTER — Telehealth: Payer: Self-pay | Admitting: *Deleted

## 2021-08-16 DIAGNOSIS — J189 Pneumonia, unspecified organism: Secondary | ICD-10-CM | POA: Diagnosis not present

## 2021-08-16 DIAGNOSIS — C8311 Mantle cell lymphoma, lymph nodes of head, face, and neck: Secondary | ICD-10-CM | POA: Diagnosis not present

## 2021-08-16 DIAGNOSIS — I517 Cardiomegaly: Secondary | ICD-10-CM | POA: Diagnosis not present

## 2021-08-16 DIAGNOSIS — R059 Cough, unspecified: Secondary | ICD-10-CM | POA: Diagnosis not present

## 2021-08-16 MED ORDER — HYDROCOD POLI-CHLORPHE POLI ER 10-8 MG/5ML PO SUER
5.0000 mL | Freq: Every evening | ORAL | 0 refills | Status: DC | PRN
Start: 2021-08-16 — End: 2021-10-08

## 2021-08-16 MED ORDER — AMOXICILLIN-POT CLAVULANATE 875-125 MG PO TABS
1.0000 | ORAL_TABLET | Freq: Two times a day (BID) | ORAL | 0 refills | Status: DC
Start: 2021-08-16 — End: 2021-09-07

## 2021-08-16 NOTE — Progress Notes (Signed)
Virtual Visit via Telephone Note ? ?I connected with [name] on 08/16/21 at  2:45 PM EDT by telephone and verified that I am speaking with the correct person using two identifiers. ? ?Location: ?Patient: Home ?Provider: Clinic ?  ?I discussed the limitations, risks, security and privacy concerns of performing an evaluation and management service by telephone and the availability of in person appointments. I also discussed with the patient that there may be a patient responsible charge related to this service. The patient expressed understanding and agreed to proceed. ? ? 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 ?  ?Patient requested virtual visit today after testing positive for COVID ? ?Observations/Objective: ?I called and spoke with patient by phone. ? ?She had COVID about 2 weeks ago and was treated with molnupiravir. ? ?Since her COVID infection, patient reports that she has had persistent cough and shortness of breath.  She is found that the cough has worsened over the past several days keeping her awake at night.  She denies fever or chills.  No wheezing.  Patient denies other symptomatic complaints. ? ?Patient reports that she is still working daily at her job. ? ?Assessment and Plan: ?CAP -chest x-ray appears consistent with left lower lobe pneumonia.  We will start patient on Augmentin x10 days.  Will start on Tussionex for cough.  ER precautions reviewed in detail.  Recommended that patient assess SPO2 regularly and would have low threshold for presenting to the ER/admission for any worsening symptoms. ? ?Case and plan discussed with Dr. Rogue Bussing ? ?Follow Up Instructions: ?Patient RTC to see Beckey Rutter, NP next week or sooner if needed ?  ?I discussed the assessment and treatment plan with the patient. The  patient was provided an opportunity to ask questions and all were answered. The patient agreed with the plan and demonstrated an understanding of the instructions. ?  ?The patient was advised to call back or seek an in-person evaluation if the symptoms worsen or if the condition fails to improve as anticipated. ? ?I provided 10 minutes of non-face-to-face time during this encounter. ? ? ?Irean Hong, NP ? ? ?

## 2021-08-16 NOTE — Telephone Encounter (Signed)
Patient called asking for medicine for her cough, states she is post COVID (tested negative after medications) but still has a cough keeping her up at night and coughs til "I am out of breath" She has tried Mucinex for the cough, but nothing else. Please advise ?

## 2021-08-16 NOTE — Telephone Encounter (Signed)
Instructions given to patient who agrees and verbalizes understanding, ask for telephone instead around 230, thx

## 2021-08-18 ENCOUNTER — Other Ambulatory Visit: Payer: Self-pay | Admitting: Internal Medicine

## 2021-08-18 NOTE — Progress Notes (Signed)
Recommend holding off rituxmab given recent pneumonia- continue acalabrutinib if otherwose doing well. Follow up in 1 month ?

## 2021-08-19 ENCOUNTER — Telehealth: Payer: Self-pay | Admitting: *Deleted

## 2021-08-19 ENCOUNTER — Other Ambulatory Visit: Payer: Self-pay

## 2021-08-19 DIAGNOSIS — C8311 Mantle cell lymphoma, lymph nodes of head, face, and neck: Secondary | ICD-10-CM

## 2021-08-19 NOTE — Telephone Encounter (Signed)
Patient called and left message that she was expecting a call from our office today, but has not received one ?

## 2021-08-19 NOTE — Telephone Encounter (Signed)
Done called pt this morning and gave her the new appt in April and she wrote it down so this must be old appt. ?

## 2021-08-20 ENCOUNTER — Telehealth: Payer: Self-pay

## 2021-08-20 NOTE — Progress Notes (Signed)
Called patient to inform her of MD recommendation. ?

## 2021-08-20 NOTE — Telephone Encounter (Signed)
Called patient to let her know Dr. B recommendations and appt changes.   ?

## 2021-08-20 NOTE — Progress Notes (Signed)
Per [place], she spoke to pt about this. ?

## 2021-08-20 NOTE — Telephone Encounter (Signed)
-----   Message from Cammie Sickle, MD sent at 08/16/2021  6:15 PM EDT ----- ?Regarding: re-scheudle appt ?Given the recent pneumonia-I would recommend HOLD rituximab infusion as planned on 3/24- with lauren. ? ?Please inform patient that I would want to hold off for treatment because of her pneumonia.. ? ?Recommend follow-up-MD labs 6 weeks- CBC CMP; quantitative immunoglobulins; possible rituxmab. Thanks ?GB ? ?

## 2021-08-22 NOTE — Telephone Encounter (Signed)
Pt scheduled to have BMET next week with PCP 08/29/21.  ?

## 2021-08-23 ENCOUNTER — Other Ambulatory Visit: Payer: Medicare HMO

## 2021-08-23 ENCOUNTER — Ambulatory Visit: Payer: Medicare HMO

## 2021-08-23 ENCOUNTER — Ambulatory Visit: Payer: Medicare HMO | Admitting: Nurse Practitioner

## 2021-08-28 ENCOUNTER — Inpatient Hospital Stay: Payer: Medicare HMO

## 2021-08-28 ENCOUNTER — Emergency Department: Payer: Medicare HMO

## 2021-08-28 ENCOUNTER — Other Ambulatory Visit: Payer: Self-pay

## 2021-08-28 ENCOUNTER — Inpatient Hospital Stay
Admission: EM | Admit: 2021-08-28 | Discharge: 2021-09-07 | DRG: 193 | Disposition: A | Discharge status: Home Health | Payer: Medicare HMO | Attending: Internal Medicine | Admitting: Internal Medicine

## 2021-08-28 DIAGNOSIS — I2602 Saddle embolus of pulmonary artery with acute cor pulmonale: Secondary | ICD-10-CM | POA: Diagnosis not present

## 2021-08-28 DIAGNOSIS — Z833 Family history of diabetes mellitus: Secondary | ICD-10-CM

## 2021-08-28 DIAGNOSIS — Z8249 Family history of ischemic heart disease and other diseases of the circulatory system: Secondary | ICD-10-CM

## 2021-08-28 DIAGNOSIS — U071 COVID-19: Secondary | ICD-10-CM | POA: Diagnosis not present

## 2021-08-28 DIAGNOSIS — J189 Pneumonia, unspecified organism: Secondary | ICD-10-CM | POA: Diagnosis not present

## 2021-08-28 DIAGNOSIS — E441 Mild protein-calorie malnutrition: Secondary | ICD-10-CM | POA: Diagnosis not present

## 2021-08-28 DIAGNOSIS — Z9221 Personal history of antineoplastic chemotherapy: Secondary | ICD-10-CM

## 2021-08-28 DIAGNOSIS — I9589 Other hypotension: Secondary | ICD-10-CM | POA: Diagnosis present

## 2021-08-28 DIAGNOSIS — E876 Hypokalemia: Secondary | ICD-10-CM | POA: Diagnosis present

## 2021-08-28 DIAGNOSIS — E785 Hyperlipidemia, unspecified: Secondary | ICD-10-CM | POA: Diagnosis present

## 2021-08-28 DIAGNOSIS — I5022 Chronic systolic (congestive) heart failure: Secondary | ICD-10-CM | POA: Diagnosis present

## 2021-08-28 DIAGNOSIS — R0902 Hypoxemia: Secondary | ICD-10-CM | POA: Diagnosis not present

## 2021-08-28 DIAGNOSIS — I429 Cardiomyopathy, unspecified: Secondary | ICD-10-CM

## 2021-08-28 DIAGNOSIS — Z8669 Personal history of other diseases of the nervous system and sense organs: Secondary | ICD-10-CM

## 2021-08-28 DIAGNOSIS — I447 Left bundle-branch block, unspecified: Secondary | ICD-10-CM | POA: Diagnosis not present

## 2021-08-28 DIAGNOSIS — R918 Other nonspecific abnormal finding of lung field: Secondary | ICD-10-CM | POA: Diagnosis not present

## 2021-08-28 DIAGNOSIS — C8311 Mantle cell lymphoma, lymph nodes of head, face, and neck: Secondary | ICD-10-CM | POA: Diagnosis not present

## 2021-08-28 DIAGNOSIS — I428 Other cardiomyopathies: Secondary | ICD-10-CM | POA: Diagnosis present

## 2021-08-28 DIAGNOSIS — Z20822 Contact with and (suspected) exposure to covid-19: Secondary | ICD-10-CM | POA: Diagnosis present

## 2021-08-28 DIAGNOSIS — D8481 Immunodeficiency due to conditions classified elsewhere: Secondary | ICD-10-CM | POA: Diagnosis present

## 2021-08-28 DIAGNOSIS — Z8261 Family history of arthritis: Secondary | ICD-10-CM

## 2021-08-28 DIAGNOSIS — Z91199 Patient's noncompliance with other medical treatment and regimen due to unspecified reason: Secondary | ICD-10-CM

## 2021-08-28 DIAGNOSIS — I071 Rheumatic tricuspid insufficiency: Secondary | ICD-10-CM | POA: Diagnosis present

## 2021-08-28 DIAGNOSIS — J9601 Acute respiratory failure with hypoxia: Secondary | ICD-10-CM | POA: Diagnosis present

## 2021-08-28 DIAGNOSIS — D630 Anemia in neoplastic disease: Secondary | ICD-10-CM | POA: Diagnosis present

## 2021-08-28 DIAGNOSIS — Z8616 Personal history of COVID-19: Secondary | ICD-10-CM

## 2021-08-28 DIAGNOSIS — M069 Rheumatoid arthritis, unspecified: Secondary | ICD-10-CM | POA: Diagnosis not present

## 2021-08-28 DIAGNOSIS — I272 Pulmonary hypertension, unspecified: Secondary | ICD-10-CM | POA: Diagnosis present

## 2021-08-28 DIAGNOSIS — G4733 Obstructive sleep apnea (adult) (pediatric): Secondary | ICD-10-CM | POA: Diagnosis present

## 2021-08-28 DIAGNOSIS — J90 Pleural effusion, not elsewhere classified: Secondary | ICD-10-CM | POA: Diagnosis not present

## 2021-08-28 DIAGNOSIS — J849 Interstitial pulmonary disease, unspecified: Secondary | ICD-10-CM | POA: Diagnosis not present

## 2021-08-28 DIAGNOSIS — K219 Gastro-esophageal reflux disease without esophagitis: Secondary | ICD-10-CM | POA: Diagnosis present

## 2021-08-28 DIAGNOSIS — Z79899 Other long term (current) drug therapy: Secondary | ICD-10-CM

## 2021-08-28 DIAGNOSIS — M1990 Unspecified osteoarthritis, unspecified site: Secondary | ICD-10-CM | POA: Diagnosis present

## 2021-08-28 DIAGNOSIS — C8590 Non-Hodgkin lymphoma, unspecified, unspecified site: Secondary | ICD-10-CM | POA: Diagnosis not present

## 2021-08-28 DIAGNOSIS — I517 Cardiomegaly: Secondary | ICD-10-CM | POA: Diagnosis not present

## 2021-08-28 DIAGNOSIS — R5381 Other malaise: Secondary | ICD-10-CM | POA: Diagnosis present

## 2021-08-28 DIAGNOSIS — J479 Bronchiectasis, uncomplicated: Secondary | ICD-10-CM | POA: Diagnosis not present

## 2021-08-28 DIAGNOSIS — R0689 Other abnormalities of breathing: Secondary | ICD-10-CM | POA: Diagnosis not present

## 2021-08-28 DIAGNOSIS — I2699 Other pulmonary embolism without acute cor pulmonale: Secondary | ICD-10-CM | POA: Diagnosis present

## 2021-08-28 DIAGNOSIS — R0602 Shortness of breath: Secondary | ICD-10-CM | POA: Diagnosis not present

## 2021-08-28 LAB — CBC
HCT: 36.1 % (ref 36.0–46.0)
Hemoglobin: 11.6 g/dL — ABNORMAL LOW (ref 12.0–15.0)
MCH: 28.6 pg (ref 26.0–34.0)
MCHC: 32.1 g/dL (ref 30.0–36.0)
MCV: 88.9 fL (ref 80.0–100.0)
Platelets: 262 10*3/uL (ref 150–400)
RBC: 4.06 MIL/uL (ref 3.87–5.11)
RDW: 13.6 % (ref 11.5–15.5)
WBC: 8.3 10*3/uL (ref 4.0–10.5)
nRBC: 0 % (ref 0.0–0.2)

## 2021-08-28 LAB — CBC WITH DIFFERENTIAL/PLATELET
Abs Immature Granulocytes: 0.06 10*3/uL (ref 0.00–0.07)
Basophils Absolute: 0 10*3/uL (ref 0.0–0.1)
Basophils Relative: 0 %
Eosinophils Absolute: 0.1 10*3/uL (ref 0.0–0.5)
Eosinophils Relative: 1 %
HCT: 39.4 % (ref 36.0–46.0)
Hemoglobin: 12.6 g/dL (ref 12.0–15.0)
Immature Granulocytes: 1 %
Lymphocytes Relative: 5 %
Lymphs Abs: 0.4 10*3/uL — ABNORMAL LOW (ref 0.7–4.0)
MCH: 28.3 pg (ref 26.0–34.0)
MCHC: 32 g/dL (ref 30.0–36.0)
MCV: 88.5 fL (ref 80.0–100.0)
Monocytes Absolute: 0.3 10*3/uL (ref 0.1–1.0)
Monocytes Relative: 4 %
Neutro Abs: 7.9 10*3/uL — ABNORMAL HIGH (ref 1.7–7.7)
Neutrophils Relative %: 89 %
Platelets: 266 10*3/uL (ref 150–400)
RBC: 4.45 MIL/uL (ref 3.87–5.11)
RDW: 13.7 % (ref 11.5–15.5)
WBC: 8.8 10*3/uL (ref 4.0–10.5)
nRBC: 0 % (ref 0.0–0.2)

## 2021-08-28 LAB — COMPREHENSIVE METABOLIC PANEL
ALT: 18 U/L (ref 0–44)
AST: 27 U/L (ref 15–41)
Albumin: 2.8 g/dL — ABNORMAL LOW (ref 3.5–5.0)
Alkaline Phosphatase: 52 U/L (ref 38–126)
Anion gap: 10 (ref 5–15)
BUN: 9 mg/dL (ref 8–23)
CO2: 28 mmol/L (ref 22–32)
Calcium: 8.9 mg/dL (ref 8.9–10.3)
Chloride: 97 mmol/L — ABNORMAL LOW (ref 98–111)
Creatinine, Ser: 0.67 mg/dL (ref 0.44–1.00)
GFR, Estimated: >60 mL/min (ref >60)
Glucose, Bld: 105 mg/dL — ABNORMAL HIGH (ref 70–99)
Potassium: 3.4 mmol/L — ABNORMAL LOW (ref 3.5–5.1)
Sodium: 135 mmol/L (ref 135–145)
Total Bilirubin: 0.7 mg/dL (ref 0.3–1.2)
Total Protein: 6.9 g/dL (ref 6.5–8.1)

## 2021-08-28 LAB — CREATININE, SERUM
Creatinine, Ser: 0.75 mg/dL (ref 0.44–1.00)
GFR, Estimated: >60 mL/min (ref >60)

## 2021-08-28 LAB — LACTIC ACID, PLASMA
Lactic Acid, Venous: 1.4 mmol/L (ref 0.5–1.9)
Lactic Acid, Venous: 1.4 mmol/L (ref 0.5–1.9)

## 2021-08-28 MED ORDER — LOSARTAN POTASSIUM 25 MG PO TABS
25.0000 mg | ORAL_TABLET | Freq: Every day | ORAL | Status: DC
Start: 2021-08-29 — End: 2021-08-29
  Filled 2021-08-28: qty 1

## 2021-08-28 MED ORDER — ROSUVASTATIN CALCIUM 5 MG PO TABS
5.0000 mg | ORAL_TABLET | Freq: Every day | ORAL | Status: DC
  Administered 2021-08-28 – 2021-09-06 (×10): 5 mg via ORAL
  Filled 2021-08-28 (×11): qty 1

## 2021-08-28 MED ORDER — SPIRONOLACTONE 25 MG PO TABS
25.0000 mg | ORAL_TABLET | Freq: Every day | ORAL | Status: DC
  Filled 2021-08-28: qty 1

## 2021-08-28 MED ORDER — IOHEXOL 300 MG/ML  SOLN
75.0000 mL | Freq: Once | INTRAMUSCULAR | Status: AC | PRN
  Administered 2021-08-28: 75 mL via INTRAVENOUS

## 2021-08-28 MED ORDER — FUROSEMIDE 40 MG PO TABS
40.0000 mg | ORAL_TABLET | Freq: Every day | ORAL | Status: DC
  Filled 2021-08-28: qty 1

## 2021-08-28 MED ORDER — ENOXAPARIN SODIUM 60 MG/0.6ML IJ SOSY
0.5000 mg/kg | PREFILLED_SYRINGE | INTRAMUSCULAR | Status: DC
  Administered 2021-08-28: 60 mg via SUBCUTANEOUS
  Filled 2021-08-28: qty 0.6

## 2021-08-28 MED ORDER — DOCUSATE SODIUM 100 MG PO CAPS: 100.0000 mg | ORAL_CAPSULE | Freq: Two times a day (BID) | ORAL | Status: DC | PRN

## 2021-08-28 MED ORDER — SODIUM CHLORIDE 0.9% FLUSH
3.0000 mL | Freq: Two times a day (BID) | INTRAVENOUS | Status: DC
  Administered 2021-08-28 – 2021-09-07 (×19): 3 mL via INTRAVENOUS

## 2021-08-28 MED ORDER — CARVEDILOL 3.125 MG PO TABS
3.1250 mg | ORAL_TABLET | Freq: Two times a day (BID) | ORAL | Status: DC
  Filled 2021-08-28: qty 1

## 2021-08-28 MED ORDER — SODIUM CHLORIDE 0.9 % IV SOLN
2.0000 g | Freq: Three times a day (TID) | INTRAVENOUS | Status: DC
  Administered 2021-08-28 – 2021-08-29 (×2): 2 g via INTRAVENOUS
  Filled 2021-08-28 (×3): qty 2

## 2021-08-28 MED ORDER — VANCOMYCIN HCL 1500 MG/300ML IV SOLN
1500.0000 mg | INTRAVENOUS | Status: DC
  Administered 2021-08-29: 1500 mg via INTRAVENOUS
  Filled 2021-08-28: qty 300

## 2021-08-28 MED ORDER — SODIUM CHLORIDE 0.9% FLUSH: 3.0000 mL | INTRAVENOUS | Status: DC | PRN

## 2021-08-28 MED ORDER — VANCOMYCIN HCL 2000 MG/400ML IV SOLN
2000.0000 mg | Freq: Once | INTRAVENOUS | Status: AC
  Administered 2021-08-28: 2000 mg via INTRAVENOUS
  Filled 2021-08-28: qty 400

## 2021-08-28 MED ORDER — SODIUM CHLORIDE 0.9 % IV SOLN
1.0000 g | Freq: Once | INTRAVENOUS | Status: AC
  Administered 2021-08-28: 1 g via INTRAVENOUS
  Filled 2021-08-28: qty 1

## 2021-08-28 MED ORDER — SODIUM CHLORIDE 0.9 % IV SOLN: 250.0000 mL | INTRAVENOUS | Status: DC | PRN

## 2021-08-28 MED ORDER — ACETAMINOPHEN 500 MG PO TABS
1000.0000 mg | ORAL_TABLET | Freq: Four times a day (QID) | ORAL | Status: DC | PRN
  Administered 2021-08-29 – 2021-09-04 (×12): 1000 mg via ORAL
  Filled 2021-08-28 (×13): qty 2

## 2021-08-28 MED ORDER — SODIUM CHLORIDE 0.9 % IV BOLUS
500.0000 mL | Freq: Once | INTRAVENOUS | Status: AC
Start: 2021-08-28 — End: 2021-08-28
  Administered 2021-08-28: 500 mL via INTRAVENOUS

## 2021-08-28 NOTE — H&P (Signed)
?History and Physical  ? ? ?Michelle [name] XHB:[id number] DOB: 1940/12/04 DOA: 08/28/2021 ? ?PCP: Burnard Hawthorne, FNP  ?Patient coming from: home ? ?I have personally briefly reviewed patient's old medical records in [place] ? ?Chief Complaint: increase cough and sob ?HPI: Michelle [name] is a 81 y.o. 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 Patient has interim hx of covid March3 treated with molnupiravir with improvement of symptoms but noted residual cough. On March 17th patient was diagnosed with LLL pna  after having regression of recovery with  increasing cough and sob. Patient was  treated with Augmentin and completed 10 day course 2 days ago. Patient now presents with ED with worsening SOB and cough. Patient notes associated symptoms of fatigue,nausea and loose stools. She denies chest pain, palpitations,fever/chills/ or decrease appetite. ? ?ED Course:  ?Vitals: 99.2,  BP 122/71,HR 88, rr 22 sat 88% on ra ,95% on 2L ?cxr  noted persistent LLL,  ?Labs: ?Wbc 8.8,plt266,pmn 89 ?Na135,K 3.4 ?\albumin 2.8,cr0.67 ?EKGLBBB unchanged from prior ?Lactic 1.4 ? tx with vanc/cefepime ?Review of Systems: As per HPI otherwise 10 point review of systems negative.  ? ?Past Medical History:  ?Diagnosis Date  ? Arthritis   ? Collagen vascular disease ([place])   ? GERD (gastroesophageal reflux disease)   ? Headache(784.0)   ? HFrEF (heart failure with reduced ejection fraction) ([place])   ? Nonischemic cardiomyopathy, LVEF as low as 30-35% in 08/2019  ? History of methotrexate therapy   ? Hyperlipidemia   ? hx  ? Lymphadenopathy of head and neck 01/2015  ? see on Thyroid ultrasound  ? Lymphoma, mantle cell ([place]) 06/01/2015  ? bx of lymph node in right breast/Stage IV Mantle Cell Lymphoma  ? Personal history of chemotherapy   ? Rheumatoid  arthritis ([place])   ? ? ?Past Surgical History:  ?Procedure Laterality Date  ? BREAST BIOPSY Left 11/07/2020  ? u/s bx-hydromark #3 "coil"-path pending  ? CARDIAC CATHETERIZATION  09/2004  ? [facility]; EF 60%  ? CARDIAC CATHETERIZATION  08/2004  ? [facility]  ? IR FLUORO GUIDED NEEDLE PLC ASPIRATION/INJECTION LOC  06/18/2018  ? PERIPHERAL VASCULAR CATHETERIZATION N/A 07/04/2015  ? Procedure: Porta Cath Insertion;  Surgeon: [name], MD;  Location: [place];  Service: Cardiovascular;  Laterality: N/A;  ? PORTA CATH REMOVAL N/A 06/23/2018  ? Procedure: PORTA CATH REMOVAL;  Surgeon: [name], MD;  Location: [place];  Service: Cardiovascular;  Laterality: N/A;  ? RIGHT/LEFT HEART CATH AND CORONARY ANGIOGRAPHY Bilateral 09/20/2019  ? Procedure: RIGHT/LEFT HEART CATH AND CORONARY ANGIOGRAPHY;  Surgeon: Nelva Bush, MD;  Location: [facility];  Service: Cardiovascular;  Laterality: Bilateral;  ? ? ? reports that she has never smoked. She has never used smokeless tobacco. She reports that she does not drink alcohol and does not use drugs. ? ?No Known Allergies ? ?Family History  ?Problem Relation Age of Onset  ? Diabetes Mother   ? Cholelithiasis Mother   ? Hypertension Sister   ? Diabetes Sister   ? Heart murmur Sister   ? Arthritis Brother   ? Breast cancer Neg Hx   ? ? ?Prior to Admission medications   ?Medication Sig Start Date End Date Taking? Authorizing Provider  ?Acalabrutinib Maleate (CALQUENCE) 100 MG TABS Take 1 tablet (100 mg) by mouth in the morning and  at bedtime. 07/15/21   Cammie Sickle, MD  ?acetaminophen (TYLENOL) 500 MG tablet Take 1,000 mg by mouth every 6 (six) hours as needed for mild pain.     [provider]  ?albuterol (VENTOLIN HFA) 108 (90 Base) MCG/ACT inhaler 2 puffs inhaled orally every 4 to 6 hours as needed, not to exceed 12 puffs in 24 hours 08/02/21   Borders, Kirt Boys, NP  ?amoxicillin-clavulanate (AUGMENTIN) 875-125 MG tablet Take 1 tablet by mouth  2 (two) times daily. 08/16/21   Borders, Kirt Boys, NP  ?carvedilol (COREG) 3.125 MG tablet TAKE 1 TABLET BY MOUTH TWICE A DAY 08/05/21   End, Harrell Gave, MD  ?chlorpheniramine-HYDROcodone 10-8 MG/5ML Take 5 mLs by mouth at bedtime as needed for cough. 08/16/21   Borders, Kirt Boys, NP  ?cyanocobalamin 2000 MCG tablet Take 2,500 mcg by mouth daily.     [provider]  ?docusate sodium (COLACE) 100 MG capsule Take 100 mg by mouth 2 (two) times daily as needed for mild constipation.    [provider]  ?fluticasone (FLONASE) 50 MCG/ACT nasal spray PLACE 2 SPRAYS INTO BOTH NOSTRILS DAILY AS NEEDED FOR ALLERGIES. 07/12/21   Kennyth Arnold, FNP  ?furosemide (LASIX) 40 MG tablet TAKE 1 TABLET BY MOUTH EVERY DAY 08/05/21   End, Harrell Gave, MD  ?losartan (COZAAR) 25 MG tablet TAKE 1 TABLET BY MOUTH EVERY DAY 08/05/21   Dunn, Areta Haber, PA-C  ?MEGARED OMEGA-3 KRILL OIL PO Take 1 tablet by mouth daily.    [provider]  ?meloxicam (MOBIC) 7.5 MG tablet TAKE 1 TABLET BY MOUTH EVERY DAY AS NEEDED FOR PAIN 08/05/21   Cammie Sickle, MD  ?Multiple Vitamins-Minerals (CENTRUM SILVER 50+WOMEN) TABS Take 1 tablet by mouth daily.    [provider]  ?Multiple Vitamins-Minerals (HAIR SKIN AND NAILS FORMULA PO) Take 1 tablet by mouth daily.    [provider]  ?ondansetron (ZOFRAN) 8 MG tablet ONE PILL EVERY 8 HOURS AS NEEDED FOR NAUSEA/VOMITTING. 08/05/21   Cammie Sickle, MD  ?rosuvastatin (CRESTOR) 5 MG tablet TAKE 1 TABLET (5 MG TOTAL) BY MOUTH DAILY. 08/05/21   Kennyth Arnold, FNP  ?spironolactone (ALDACTONE) 25 MG tablet Take 1 tablet (25 mg total) by mouth daily. 07/17/21 04/13/22  Nelva Bush, MD  ? ? ?Physical Exam: ?Vitals:  ? 08/28/21 1139 08/28/21 1151 08/28/21 1152 08/28/21 1245  ?BP:    109/85  ?Pulse:    84  ?Resp:    20  ?Temp:      ?TempSrc:      ?SpO2:  (!) 88% 93% 99%  ?Weight: 117.5 kg     ? ? ? ?Vitals:  ? 08/28/21 1139 08/28/21 1151 08/28/21 1152 08/28/21 1245  ?BP:     109/85  ?Pulse:    84  ?Resp:    20  ?Temp:      ?TempSrc:      ?SpO2:  (!) 88% 93% 99%  ?Weight: 117.5 kg     ?Constitutional: NAD, calm, comfortable ?Eyes: PERRL, lids and conjunctivae normal ?ENMT: Mucous membranes are dry, Posterior pharynx clear of any exudate or lesions.Normal dentition.  ?Neck: normal, supple, no masses, no thyromegaly ?Respiratory: occasional rhonchi,coarse bs b/l, no wheezing, no crackles. Mild conversational DOE. No accessory muscle use.  ?Cardiovascular: Regular rate and rhythm, no murmurs / rubs / gallops. No extremity edema. 2+ pedal pulses. No carotid bruits.  ?Abdomen: no tenderness, no masses palpated. No hepatosplenomegaly. Bowel sounds positive.  ?Musculoskeletal: no clubbing / cyanosis.  No joint deformity upper and lower extremities. Good ROM, no contractures. Normal muscle tone.  ?Skin: no rashes, lesions, ulcers. No induration ?Neurologic: CN 2-12 grossly intact. Sensation intact, DTR normal. Strength 5/5 in all 4.  ?Psychiatric: Normal judgment and insight. Alert and oriented x 3. Normal mood.  ? ? ?Labs on Admission: I have personally reviewed following labs and imaging studies ? ?CBC: ?Recent Labs  ?Lab 08/28/21 ?1141  ?WBC 8.8  ?NEUTROABS 7.9*  ?HGB 12.6  ?HCT 39.4  ?MCV 88.5  ?PLT 266  ? ?Basic Metabolic Panel: ?Recent Labs  ?Lab 08/28/21 ?1141  ?NA 135  ?K 3.4*  ?CL 97*  ?CO2 28  ?GLUCOSE 105*  ?BUN 9  ?CREATININE 0.67  ?CALCIUM 8.9  ? ?GFR: ?Estimated Creatinine Clearance: 76.9 mL/min (by C-G formula based on SCr of 0.67 mg/dL). ?Liver Function Tests: ?Recent Labs  ?Lab 08/28/21 ?1141  ?AST 27  ?ALT 18  ?ALKPHOS 52  ?BILITOT 0.7  ?PROT 6.9  ?ALBUMIN 2.8*  ? ?No results for input(s): LIPASE, AMYLASE in the last 168 hours. ?No results for input(s): AMMONIA in the last 168 hours. ?Coagulation Profile: ?No results for input(s): INR, PROTIME in the last 168 hours. ?Cardiac Enzymes: ?No results for input(s): CKTOTAL, CKMB, CKMBINDEX, TROPONINI in the last 168 hours. ?BNP  (last 3 results) ?No results for input(s): PROBNP in the last 8760 hours. ?HbA1C: ?No results for input(s): HGBA1C in the last 72 hours. ?CBG: ?No results for input(s): GLUCAP in the last 168 hours. ?Lip

## 2021-08-28 NOTE — ED Notes (Signed)
One set of cultures and rainbow sent ?

## 2021-08-28 NOTE — Progress Notes (Signed)
PHARMACIST - PHYSICIAN COMMUNICATION ? ?CONCERNING:  Enoxaparin (Lovenox) for DVT Prophylaxis  ? ?DESCRIPTION: ?Patient was prescribed enoxaprin '40mg'$  q24 hours for VTE prophylaxis.  ? ?Filed Weights  ? 08/28/21 1139  ?Weight: 117.5 kg (259 lb)  ? ? ?Body mass index is 40.57 kg/m?. ? ?Estimated Creatinine Clearance: 76.9 mL/min (by C-G formula based on SCr of 0.67 mg/dL). ? ? ?Based on [place] patient is candidate for enoxaparin 0.'5mg'$ /kg TBW SQ every 24 hours based on BMI being >30. ? ? ?RECOMMENDATION: ?Pharmacy has adjusted enoxaparin dose per [facility] policy. ? ?Patient is now receiving enoxaparin 60 mg every 24 hours  ? ? ?Darnelle Bos, PharmD ?Clinical Pharmacist  ?08/28/2021 ?2:59 PM ? ?

## 2021-08-28 NOTE — Progress Notes (Signed)
Pharmacy Antibiotic Note ? ?Michelle [name] is a 81 y.o. 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  Pharmacy has been consulted for vancomycin and cefepime dosing. Renal function appears stable and at apparent baseline. ? ?Plan: ? ?1) start cefepime 2 grams IV every 8 hours ? ?2) start vancomycin 1500 mg IV Q 24 hrs (2000 mg loading dose administered in the ED) ?Goal AUC 400-550 ?Expected AUC: 498.9 ?SCr used: 0.80 mg/dL (rounded up) ?Ke: 0.051 h-1, T1/2: 13.5h ?Daily serum creatinine while on IV vancomycin ?MRSA PCR ordered for potential narrowing of therapy ? ? ?Weight: 117.5 kg (259 lb) ? ?Temp (24hrs), Avg:99.2 ?F (37.3 ?C), Min:99.2 ?F (37.3 ?C), Max:99.2 ?F (37.3 ?C) ? ?Recent Labs  ?Lab 08/28/21 ?1141 08/28/21 ?1153  ?WBC 8.8  --   ?CREATININE 0.67  --   ?LATICACIDVEN  --  1.4  ?  ?Estimated Creatinine Clearance: 76.9 mL/min (by C-G formula based on SCr of 0.67 mg/dL).   ? ?No Known Allergies ? ?Antimicrobials this admission: ?03/29 vancomycin >>  ?03/29 cefepime >>  ? ?Microbiology results: ?03/29 BCx: pending ?03/29 Sputum: pending  ?03/29 MRSA PCR: pending ? ?Thank you for allowing pharmacy to be a part of this patient?s care. ? ?Dallie Piles ?08/28/2021 3:06 PM ? ?

## 2021-08-28 NOTE — ED Provider Notes (Signed)
? ?[facility] ?Provider Note ? ? ? Event Date/Time  ? First MD Initiated Contact with Patient 08/28/21 1245   ?  (approximate) ? ? ?History  ? ?Shortness of Breath ? ? ?HPI ? ?Michelle [name] is a 81 y.o. female who according to my review of oncology note from March 17 of this year has PMH cardiomyopathy, pulmonary hypertension, and stage IV mantle cell lymphoma  ? ?  ?She developed COVID about 3 to 4 weeks ago, then later developed pneumonia.  She is been treated and has taken 10 days of amoxicillin which just completed.  She continues to have shortness of breath and a cough and feels her shortness of breath is worsening her symptoms actually feel to be getting worse despite treatment of antibiotic ? ?No chest pain.  No nausea.  No fevers for the last several days ? ?No leg swelling.  Has not had chemo for several weeks now ? ?Physical Exam  ? ?Triage Vital Signs: ?ED Triage Vitals  ?Enc Vitals Group  ?   BP 08/28/21 1137 122/71  ?   Pulse Rate 08/28/21 1137 88  ?   Resp 08/28/21 1137 (!) 22  ?   Temp 08/28/21 1137 99.2 ?F (37.3 ?C)  ?   Temp Source 08/28/21 1137 Oral  ?   SpO2 08/28/21 1137 95 %  ?   Weight 08/28/21 1139 259 lb (117.5 kg)  ?   Height --   ?   Head Circumference --   ?   Peak Flow --   ?   Pain Score 08/28/21 1139 0  ?   Pain Loc --   ?   Pain Edu? --   ?   Excl. in [place]? --   ? ? ?Most recent vital signs: ?Vitals:  ? 08/28/21 1300 08/28/21 1330  ?BP: 98/63 104/72  ?Pulse: 86 86  ?Resp: (!) 21 20  ?Temp:    ?SpO2: 100% 94%  ? ? ? ?General: Awake, no distress.  Appears mildly dyspneic on 2 L nasal cannula saturation in the mid 90s ?CV:  Good peripheral perfusion.  Normal rate and rhythm no tachycardia ?Resp:  Mild tachypnea, mild use of accessory muscles but no acute extremis or distress clear lung sounds on the right, left lung with notable rales in the mid to left lower lungs ?Abd:  No distention.  ?Other:  No lower extremity edema ? ? ?ED Results / Procedures / Treatments   ? ?Labs ?(all labs ordered are listed, but only abnormal results are displayed) ?Labs Reviewed  ?COMPREHENSIVE METABOLIC PANEL - Abnormal; Notable for the following components:  ?    Result Value  ? Potassium 3.4 (*)   ? Chloride 97 (*)   ? Glucose, Bld 105 (*)   ? Albumin 2.8 (*)   ? All other components within normal limits  ?CBC WITH DIFFERENTIAL/PLATELET - Abnormal; Notable for the following components:  ? Neutro Abs 7.9 (*)   ? Lymphs Abs 0.4 (*)   ? All other components within normal limits  ?CULTURE, BLOOD (ROUTINE X 2)  ?CULTURE, BLOOD (ROUTINE X 2)  ?LACTIC ACID, PLASMA  ?LACTIC ACID, PLASMA  ?URINALYSIS, ROUTINE W REFLEX MICROSCOPIC  ? ? ? ?EKG ? ?Reviewed inter by me at noon ?Heart rate 85 ?QRS 130 ?QTc 480 ?Normal sinus rhythm, left bundle branch block.  No evidence of acute ischemia ? ? ?RADIOLOGY ? ? ?Personally viewed and interpreted the patient's chest x-ray, concerning for notable infiltrative disease in  the left lung ? ? ? ? ?PROCEDURES: ? ?Critical Care performed: No ? ?Procedures ? ? ?MEDICATIONS ORDERED IN ED: ?Medications  ?vancomycin (VANCOREADY) IVPB 2000 mg/400 mL (has no administration in time range)  ?sodium chloride 0.9 % bolus 500 mL (has no administration in time range)  ?ceFEPIme (MAXIPIME) 1 g in sodium chloride 0.9 % 100 mL IVPB (1 g Intravenous New Bag/Given 08/28/21 1323)  ? ? ? ?IMPRESSION / MDM / ASSESSMENT AND PLAN / ED COURSE  ?I reviewed the triage vital signs and the nursing notes. ?             ?               ? ?Differential diagnosis includes, but is not limited to, pneumonia with failed outpatient treatment, tumor mass lesion, etc. ? ?Afebrile normal white count.  Patient being treated for pneumonia though and symptoms worsening, and given her use of chemotherapy recent COVID suspect this is likely worsening burden of pneumonia.  Will broaden antibiotics admit to the hospitalist.  In consideration, if her symptoms are not improving no additional imaging such as CT imaging  may be helpful, but her clinical picture does not presently seem suggestive of etiology such as pulmonary embolism or obvious worsening tumor or mass burden given known mantle cell lymphoma ? ?The patient is on the cardiac monitor to evaluate for evidence of arrhythmia and/or significant heart rate changes. ? ?No chest pain. ? ? ?Clinical Course as of 08/28/21 1356  ?Wed Aug 28, 2021  ?1318 I consulted with our hospitalist service, after discussing the case with Dr. Marcello Moores patient will be admitted to the hospitalist for further care and treatment.  Patient is at elevated risk of morbidity and mortality associated with likely pneumonia that has failed outpatient treatment [MQ]  ?1319 Patient as well as her sister both understanding very agreeable with the plan for admission. [MQ]  ?  ?Clinical Course User Index ?[MQ] Delman Kitten, MD  ? ? ? ?FINAL CLINICAL IMPRESSION(S) / ED DIAGNOSES  ? ?Final diagnoses:  ?HCAP (healthcare-associated pneumonia)  ? ? ? ?Rx / DC Orders  ? ?ED Discharge Orders   ? ? None  ? ?  ? ? ? ?Note:  This document was prepared using Dragon voice recognition software and may include unintentional dictation errors. ?  Delman Kitten, MD ?08/28/21 1356 ? ?

## 2021-08-28 NOTE — ED Triage Notes (Addendum)
Pt comes ems with shob since having covid march 3rd. Got pneumonia. Has been shob since. Is active cancer pt.  ?

## 2021-08-28 NOTE — Progress Notes (Signed)
PHARMACY -  BRIEF ANTIBIOTIC NOTE  ? ?Pharmacy has received consult(s) for vancomycin from an ED provider.  The patient's profile has been reviewed for ht/wt/allergies/indication/available labs.   ? ?One time order(s) placed for vancomycin 2000 mg IV x 1 ? ?Further antibiotics/pharmacy consults should be ordered by admitting physician if indicated.       ?                ?Thank you, ?Dallie Piles ?08/28/2021  12:59 PM ? ?

## 2021-08-28 NOTE — ED Notes (Signed)
Informed rn bed assigned 

## 2021-08-29 ENCOUNTER — Encounter: Payer: Self-pay | Admitting: Internal Medicine

## 2021-08-29 ENCOUNTER — Inpatient Hospital Stay: Payer: Medicare HMO

## 2021-08-29 ENCOUNTER — Other Ambulatory Visit: Payer: Self-pay

## 2021-08-29 ENCOUNTER — Other Ambulatory Visit: Payer: Medicare HMO

## 2021-08-29 DIAGNOSIS — J189 Pneumonia, unspecified organism: Secondary | ICD-10-CM | POA: Diagnosis not present

## 2021-08-29 LAB — CBC WITH DIFFERENTIAL/PLATELET
Abs Immature Granulocytes: 0.07 10*3/uL (ref 0.00–0.07)
Basophils Absolute: 0 10*3/uL (ref 0.0–0.1)
Basophils Relative: 0 %
Eosinophils Absolute: 0.2 10*3/uL (ref 0.0–0.5)
Eosinophils Relative: 2 %
HCT: 34.7 % — ABNORMAL LOW (ref 36.0–46.0)
Hemoglobin: 11.1 g/dL — ABNORMAL LOW (ref 12.0–15.0)
Immature Granulocytes: 1 %
Lymphocytes Relative: 6 %
Lymphs Abs: 0.5 10*3/uL — ABNORMAL LOW (ref 0.7–4.0)
MCH: 28.2 pg (ref 26.0–34.0)
MCHC: 32 g/dL (ref 30.0–36.0)
MCV: 88.3 fL (ref 80.0–100.0)
Monocytes Absolute: 0.3 10*3/uL (ref 0.1–1.0)
Monocytes Relative: 4 %
Neutro Abs: 7 10*3/uL (ref 1.7–7.7)
Neutrophils Relative %: 87 %
Platelets: 242 10*3/uL (ref 150–400)
RBC: 3.93 MIL/uL (ref 3.87–5.11)
RDW: 13.6 % (ref 11.5–15.5)
WBC: 8.1 10*3/uL (ref 4.0–10.5)
nRBC: 0 % (ref 0.0–0.2)

## 2021-08-29 LAB — URINALYSIS, ROUTINE W REFLEX MICROSCOPIC
Bilirubin Urine: NEGATIVE
Glucose, UA: NEGATIVE mg/dL
Hgb urine dipstick: NEGATIVE
Ketones, ur: 5 mg/dL — AB
Leukocytes,Ua: NEGATIVE
Nitrite: NEGATIVE
Protein, ur: NEGATIVE mg/dL
Specific Gravity, Urine: 1.031 — ABNORMAL HIGH (ref 1.005–1.030)
pH: 6 (ref 5.0–8.0)

## 2021-08-29 LAB — RESPIRATORY PANEL BY PCR

## 2021-08-29 LAB — EXPECTORATED SPUTUM ASSESSMENT W GRAM STAIN, RFLX TO RESP C

## 2021-08-29 LAB — STREP PNEUMONIAE URINARY ANTIGEN: Strep Pneumo Urinary Antigen: NEGATIVE

## 2021-08-29 LAB — BASIC METABOLIC PANEL
Anion gap: 8 (ref 5–15)
BUN: 10 mg/dL (ref 8–23)
CO2: 27 mmol/L (ref 22–32)
Calcium: 8.2 mg/dL — ABNORMAL LOW (ref 8.9–10.3)
Chloride: 99 mmol/L (ref 98–111)
Creatinine, Ser: 0.65 mg/dL (ref 0.44–1.00)
GFR, Estimated: >60 mL/min (ref >60)
Glucose, Bld: 103 mg/dL — ABNORMAL HIGH (ref 70–99)
Potassium: 3.8 mmol/L (ref 3.5–5.1)
Sodium: 134 mmol/L — ABNORMAL LOW (ref 135–145)

## 2021-08-29 LAB — PROCALCITONIN: Procalcitonin: <0.1 ng/mL

## 2021-08-29 LAB — MRSA NEXT GEN BY PCR, NASAL: MRSA by PCR Next Gen: NOT DETECTED

## 2021-08-29 LAB — HIV ANTIBODY (ROUTINE TESTING W REFLEX): HIV Screen 4th Generation wRfx: NONREACTIVE

## 2021-08-29 LAB — BRAIN NATRIURETIC PEPTIDE: B Natriuretic Peptide: 45.1 pg/mL (ref 0.0–100.0)

## 2021-08-29 LAB — D-DIMER, QUANTITATIVE: D-Dimer, Quant: 2.9 ug/mL-FEU — ABNORMAL HIGH (ref 0.00–0.50)

## 2021-08-29 MED ORDER — SODIUM CHLORIDE 0.9 % IV SOLN
2.0000 g | INTRAVENOUS | Status: DC
  Administered 2021-08-29 – 2021-09-01 (×4): 2 g via INTRAVENOUS
  Filled 2021-08-29: qty 2
  Filled 2021-08-29: qty 20
  Filled 2021-08-29 (×3): qty 2

## 2021-08-29 MED ORDER — FLUTICASONE PROPIONATE 50 MCG/ACT NA SUSP
2.0000 | Freq: Every day | NASAL | Status: DC | PRN
  Filled 2021-08-29: qty 16

## 2021-08-29 MED ORDER — IOHEXOL 350 MG/ML SOLN
75.0000 mL | Freq: Once | INTRAVENOUS | Status: AC | PRN
  Administered 2021-08-29: 75 mL via INTRAVENOUS

## 2021-08-29 MED ORDER — IOHEXOL 300 MG/ML  SOLN: 100.0000 mL | Freq: Once | INTRAMUSCULAR | Status: DC | PRN

## 2021-08-29 MED ORDER — ENOXAPARIN SODIUM 60 MG/0.6ML IJ SOSY
0.5000 mg/kg | PREFILLED_SYRINGE | INTRAMUSCULAR | Status: DC
  Administered 2021-08-29: 60 mg via SUBCUTANEOUS
  Filled 2021-08-29: qty 0.6

## 2021-08-29 MED ORDER — ENSURE ENLIVE PO LIQD
237.0000 mL | Freq: Two times a day (BID) | ORAL | Status: DC
  Administered 2021-08-29 – 2021-09-04 (×10): 237 mL via ORAL

## 2021-08-29 MED ORDER — SODIUM CHLORIDE 0.9 % IV SOLN
500.0000 mg | INTRAVENOUS | Status: AC
  Administered 2021-08-29 – 2021-08-31 (×3): 500 mg via INTRAVENOUS
  Filled 2021-08-29 (×3): qty 500

## 2021-08-29 NOTE — Evaluation (Signed)
Physical Therapy Evaluation ?Patient Details ?Name: Michelle [name] ?MRN: [id number] ?DOB: [birth year] ?Today's Date: 08/29/2021 ? ?History of Present Illness ? Michelle [name] is a 81 y.o. 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 Patient has interim hx of covid March3 treated with molnupiravir with improvement of symptoms but noted residual cough. On March 17th patient was diagnosed with LLL pna  after having regression of recovery with  increasing cough and sob. Patient was  treated with Augmentin and completed 10 day course 2 days ago. Patient now presents with ED with worsening SOB and cough. Patient notes associated symptoms of fatigue,nausea and loose stools. She denies chest pain, palpitations,fever/chills/ or decrease appetite. ?  ?Clinical Impression ? Patient received in recliner. States she is not feeling well, really wants to just lie back down. She is able to stand from recliner without physical assist. Just line management and supervision. Patient able to take a few steps from recliner back to bed with RW and min guard. Patient will continue to benefit from skilled PT while here to improve endurance, strength and functional independence to return home with spouse.     ?   ? ?Recommendations for follow up therapy are one component of a multi-disciplinary discharge planning process, led by the attending physician.  Recommendations may be updated based on patient status, additional functional criteria and insurance authorization. ? ?Follow Up Recommendations Home health PT ? ?  ?Assistance Recommended at Discharge Intermittent Supervision/Assistance  ?Patient can return home with the following ? A little help with walking and/or transfers;A little help with bathing/dressing/bathroom;Help with stairs or ramp for  entrance;Assistance with cooking/housework ? ?  ?Equipment Recommendations None recommended by PT  ?Recommendations for Other Services ?    ?  ?Functional Status Assessment Patient has had a recent decline in their functional status and demonstrates the ability to make significant improvements in function in a reasonable and predictable amount of time.  ? ?  ?Precautions / Restrictions Precautions ?Precautions: Fall ?Precaution Comments: mod fall ?Restrictions ?Weight Bearing Restrictions: No  ? ?  ? ?Mobility ? Bed Mobility ?Overal bed mobility: Modified Independent ?  ?  ?  ?  ?  ?  ?General bed mobility comments: able to get into supine from seated position without assist. ?  ? ?Transfers ?Overall transfer level: Modified independent ?Equipment used: Rolling walker (2 wheels) ?  ?  ?  ?  ?  ?  ?  ?General transfer comment: mod I ?  ? ?Ambulation/Gait ?Ambulation/Gait assistance: Modified independent (Device/Increase time) ?Gait Distance (Feet): 3 Feet ?Assistive device: Rolling walker (2 wheels) ?Gait Pattern/deviations: Step-to pattern, Decreased step length - right, Decreased step length - left ?Gait velocity: decr ?  ?  ?General Gait Details: patient able to take a few steps from recliner to bed ? ?Stairs ?  ?  ?  ?  ?  ? ?Wheelchair Mobility ?  ? ?Modified Rankin (Stroke Patients Only) ?  ? ?  ? ?Balance Overall balance assessment: Needs assistance ?Sitting-balance support: Feet supported ?Sitting balance-Leahy Scale: Fair ?  ?  ?Standing balance support: Bilateral upper extremity supported, During functional activity, Reliant on assistive device for balance ?Standing balance-Leahy Scale: Good ?Standing balance comment: fair standing balance ?  ?  ?  ?  ?  ?  ?  ?  ?  ?  ?  ?   ? ? ? ?  Pertinent Vitals/Pain Pain Assessment ?Pain Assessment: Faces ?Faces Pain Scale: Hurts a little bit ?Pain Location: legs ?Pain Descriptors / Indicators: Discomfort, Sore ?Pain Intervention(s): Monitored during session,  Repositioned  ? ? ?Home Living Family/patient expects to be discharged to:: Private residence ?Living Arrangements: Spouse/significant other ?Available Help at Discharge: Family;Available PRN/intermittently ?Type of Home: House ?Home Access: Level entry ?  ?  ?  ?Home Layout: One level ?Home Equipment: Conservation officer, nature (2 wheels) ?Additional Comments: patient lives with spouse but he is currently in hospital at [facility]. Has cancer per patient.  ?  ?Prior Function Prior Level of Function : Independent/Modified Independent;Driving ?  ?  ?  ?  ?  ?  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  ? ? ?Hand Dominance  ?   ? ?  ?Extremity/Trunk Assessment  ? Upper Extremity Assessment ?Upper Extremity Assessment: Generalized weakness ?  ? ?Lower Extremity Assessment ?Lower Extremity Assessment: Generalized weakness ?  ? ?Cervical / Trunk Assessment ?Cervical / Trunk Assessment: Normal  ?Communication  ? Communication: No difficulties  ?Cognition Arousal/Alertness: Awake/alert ?Behavior During Therapy: [facility] for tasks assessed/performed ?Overall Cognitive Status: Within Functional Limits for tasks assessed ?  ?  ?  ?  ?  ?  ?  ?  ?  ?  ?  ?  ?  ?  ?  ?  ?  ?  ?  ? ?  ?General Comments   ? ?  ?Exercises    ? ?Assessment/Plan  ?  ?PT Assessment Patient needs continued PT services  ?PT Problem List Decreased strength;Decreased mobility;Decreased activity tolerance;Decreased balance ? ?   ?  ?PT Treatment Interventions DME instruction;Therapeutic exercise;Gait training;Stair training;Functional mobility training;Therapeutic activities;Patient/family education   ? ?PT Goals (Current goals can be found in the Care Plan section)  ?Acute Rehab PT Goals ?Patient Stated Goal: to return home, feel better ?PT Goal Formulation: With patient ?Time For Goal Achievement: 09/12/21 ?Potential to Achieve Goals: Good ? ?  ?Frequency Min 2X/week ?   ? ? ?Co-evaluation   ?  ?  ?  ?  ? ? ?  ?AM-PAC PT "6 Clicks" Mobility  ?Outcome Measure Help needed turning from your back to your side while in a flat bed without using bedrails?: A Little ?Help needed moving from lying on your back to sitting on the side of a flat bed without using bedrails?: A Little ?Help needed moving to and from a bed to a chair (including a wheelchair)?: A Lot ?Help needed standing up from a chair using your arms (e.g., wheelchair or bedside chair)?: A Little ?Help needed to walk in hospital room?: A Lot ?Help needed climbing 3-5 steps with a railing? : A Lot ?6 Click Score: 15 ? ?  ?End of Session Equipment Utilized During Treatment: Oxygen ?Activity Tolerance: Patient limited by fatigue ?Patient left: in bed;with call bell/phone within reach ?Nurse Communication: Mobility status ?PT Visit Diagnosis: Muscle weakness (generalized) (M62.81);Difficulty in walking, not elsewhere classified (R26.2) ?  ? ?Time: 1400-1410 ?PT Time Calculation (min) (ACUTE ONLY): 10 min ? ? ?Charges:   PT Evaluation ?$PT Eval Low Complexity: 1 Low ?  ?  ?   ? ? ?Amanda Cockayne, PT, GCS ?08/29/21,2:43 PM ? ?

## 2021-08-29 NOTE — Plan of Care (Signed)
?  Problem: Clinical Measurements: ?Goal: Ability to maintain clinical measurements within normal limits will improve ?Outcome: Progressing ?Goal: Will remain free from infection ?Outcome: Progressing ?Goal: Diagnostic test results will improve ?Outcome: Progressing ?Goal: Respiratory complications will improve ?Outcome: Progressing ?Goal: Cardiovascular complication will be avoided ?Outcome: Progressing ?  ?Problem: Activity: ?Goal: Risk for activity intolerance will decrease ?Outcome: Progressing ?  ?Problem: Safety: ?Goal: Ability to remain free from injury will improve ?Outcome: Progressing ?  ?Problem: Skin Integrity: ?Goal: Risk for impaired skin integrity will decrease ?Outcome: Progressing ?  ?Problem: Respiratory: ?Goal: Ability to maintain adequate ventilation will improve ?Outcome: Progressing ?Goal: Ability to maintain a clear airway will improve ?Outcome: Progressing ?  ?

## 2021-08-29 NOTE — Consult Note (Signed)
?[place]  ?[phone] [phone] ? ?ID: Michelle [name] OB: Jun 26, 1940  MR#: [id number]  QXI#:[id number] ? ?Patient Care Team: ?Burnard Hawthorne, FNP as PCP - General (Family Medicine) ?Nelva Bush, MD as PCP - Cardiology (Cardiology) ?Deboraha Sprang, MD as PCP - Electrophysiology (Cardiology) ?Jackolyn Confer, MD (Internal Medicine) ?Cammie Sickle, MD as Consulting Physician (Internal Medicine) ? ?CHIEF COMPLAINT: Stage IV mantle cell carcinoma, now with worsening shortness of breath and community-acquired pneumonia. ? ?INTERVAL HISTORY: Patient is a 81 year old female recently under treatment for stage IV mantle cell lymphoma, but reports she discontinued her treatment over a month ago when she was admitted to the hospital with [place].  Patient states her shortness of breath and cough never fully recovered and became acutely worse over the last several days.  She also reports her husband is admitted at [facility] and was recently diagnosed with incurable cancer where they were told "nothing we can do".  She has no neurologic complaints.  She denies any fevers.  She has a good appetite and denies weight loss.  She does not complain of pain today.  She has no chest pain or hemoptysis.  She has no nausea, vomiting, constipation, or diarrhea.  She has no urinary complaints.  Patient offers no further specific complaints today. ? ?REVIEW OF SYSTEMS:   ?Review of Systems  ?Constitutional:  Positive for malaise/fatigue. Negative for fever and weight loss.  ?Respiratory:  Positive for cough and shortness of breath. Negative for hemoptysis.   ?Cardiovascular: Negative.  Negative for chest pain and leg swelling.  ?Gastrointestinal: Negative.  Negative for abdominal pain.  ?Genitourinary: Negative.  Negative for dysuria.  ?Musculoskeletal: Negative.  Negative for back pain.  ?Skin: Negative.  Negative for rash.  ?Neurological:  Positive for weakness. Negative for  dizziness, focal weakness and headaches.  ?Psychiatric/Behavioral: Negative.  The patient is not nervous/anxious.   ? ?As per HPI. Otherwise, a complete review of systems is negative. ? ?PAST MEDICAL HISTORY: ?Past Medical History:  ?Diagnosis Date  ? Arthritis   ? Collagen vascular disease ([place])   ? GERD (gastroesophageal reflux disease)   ? Headache(784.0)   ? HFrEF (heart failure with reduced ejection fraction) ([place])   ? Nonischemic cardiomyopathy, LVEF as low as 30-35% in 08/2019  ? History of methotrexate therapy   ? Hyperlipidemia   ? hx  ? Lymphadenopathy of head and neck 01/2015  ? see on Thyroid ultrasound  ? Lymphoma, mantle cell ([place]) 06/01/2015  ? bx of lymph node in right breast/Stage IV Mantle Cell Lymphoma  ? Personal history of chemotherapy   ? Rheumatoid arthritis ([place])   ? ? ?PAST SURGICAL HISTORY: ?Past Surgical History:  ?Procedure Laterality Date  ? BREAST BIOPSY Left 11/07/2020  ? u/s bx-hydromark #3 "coil"-path pending  ? CARDIAC CATHETERIZATION  09/2004  ? [facility]; EF 60%  ? CARDIAC CATHETERIZATION  08/2004  ? [place]  ? IR FLUORO GUIDED NEEDLE PLC ASPIRATION/INJECTION LOC  06/18/2018  ? PERIPHERAL VASCULAR CATHETERIZATION N/A 07/04/2015  ? Procedure: Porta Cath Insertion;  Surgeon: [name], MD;  Location: [place] CV LAB;  Service: Cardiovascular;  Laterality: N/A;  ? PORTA CATH REMOVAL N/A 06/23/2018  ? Procedure: PORTA CATH REMOVAL;  Surgeon: [name], MD;  Location: [place] CV LAB;  Service: Cardiovascular;  Laterality: N/A;  ? RIGHT/LEFT HEART CATH AND CORONARY ANGIOGRAPHY Bilateral 09/20/2019  ? Procedure: RIGHT/LEFT HEART CATH AND CORONARY ANGIOGRAPHY;  Surgeon: Nelva Bush, MD;  Location: [facility]  INVASIVE CV LAB;  Service: Cardiovascular;  Laterality: Bilateral;  ? ? ?FAMILY HISTORY: ?Family History  ?Problem Relation Age of Onset  ? Diabetes Mother   ? Cholelithiasis Mother   ? Hypertension Sister   ? Diabetes Sister   ? Heart murmur Sister   ? Arthritis Brother   ? Breast  cancer Neg Hx   ? ? ?ADVANCED DIRECTIVES (Y/N):  '@ADVDIR'$ @ ? ?HEALTH MAINTENANCE: ?Social History  ? ?Tobacco Use  ? Smoking status: Never  ? Smokeless tobacco: Never  ?Vaping Use  ? Vaping Use: Never used  ?Substance Use Topics  ? Alcohol use: No  ? Drug use: No  ? ? ? Colonoscopy: ? PAP: ? Bone density: ? Lipid panel: ? ?No Known Allergies ? ?Current Facility-Administered Medications  ?Medication Dose Route Frequency Provider Last Rate Last Admin  ? 0.9 %  sodium chloride infusion  250 mL Intravenous PRN Clance Boll, MD      ? acetaminophen (TYLENOL) tablet 1,000 mg  1,000 mg Oral Q6H PRN Clance Boll, MD      ? azithromycin (ZITHROMAX) 500 mg in sodium chloride 0.9 % 250 mL IVPB  500 mg Intravenous Q24H Gwynne Edinger, MD 250 mL/hr at 08/29/21 1128 500 mg at 08/29/21 1128  ? cefTRIAXone (ROCEPHIN) 2 g in sodium chloride 0.9 % 100 mL IVPB  2 g Intravenous Q24H Gwynne Edinger, MD 200 mL/hr at 08/29/21 1246 2 g at 08/29/21 1246  ? docusate sodium (COLACE) capsule 100 mg  100 mg Oral BID PRN Clance Boll, MD      ? enoxaparin (LOVENOX) injection 60 mg  0.5 mg/kg Subcutaneous Q24H Wouk, Ailene Rud, MD      ? fluticasone [facility]) 50 MCG/ACT nasal spray 2 spray  2 spray Each Nare Daily PRN Wouk, Ailene Rud, MD      ? rosuvastatin (CRESTOR) tablet 5 mg  5 mg Oral Daily Myles Rosenthal A, MD   5 mg at 08/28/21 2046  ? sodium chloride flush (NS) 0.9 % injection 3 mL  3 mL Intravenous Q12H Myles Rosenthal A, MD   3 mL at 08/29/21 0836  ? sodium chloride flush (NS) 0.9 % injection 3 mL  3 mL Intravenous PRN Clance Boll, MD      ? ?Facility-Administered Medications Ordered in Other Encounters  ?Medication Dose Route Frequency Provider Last Rate Last Admin  ? heparin lock flush 100 unit/mL  500 Units Intravenous Once Charlaine Dalton R, MD      ? sodium chloride flush (NS) 0.9 % injection 10 mL  10 mL Intravenous Once Charlaine Dalton R, MD      ? sodium chloride flush (NS)  0.9 % injection 10 mL  10 mL Intravenous Once Cammie Sickle, MD      ? Tbo-Filgrastim Galen Daft) injection 480 mcg  480 mcg Subcutaneous Once Cammie Sickle, MD      ? ? ?OBJECTIVE: ?Vitals:  ? 08/29/21 1140 08/29/21 1200  ?BP: (!) 156/81   ?Pulse: 64   ?Resp:  (!) 24  ?Temp:    ?SpO2:    ?   Body mass index is 33.69 kg/m?[name]    ECOG FS:2 - Symptomatic, <50% confined to bed ? ?General: Well-developed, well-nourished, no acute distress. ?Eyes: Pink conjunctiva, anicteric sclera. ?HEENT: Normocephalic, moist mucous membranes. ?Lungs: No audible wheezing or coughing. ?Heart: Regular rate and rhythm. ?Abdomen: Soft, nontender, no obvious distention. ?Musculoskeletal: No edema, cyanosis, or clubbing. ?Neuro: Alert, answering all questions appropriately. Cranial nerves  grossly intact. ?Skin: No rashes or petechiae noted. ?Psych: Normal affect. ?Lymphatics: No cervical, calvicular, axillary or inguinal LAD. ? ? ?LAB RESULTS: ? ?Lab Results  ?Component Value Date  ? NA 134 (L) 08/29/2021  ? K 3.8 08/29/2021  ? CL 99 08/29/2021  ? CO2 27 08/29/2021  ? GLUCOSE 103 (H) 08/29/2021  ? BUN 10 08/29/2021  ? CREATININE 0.65 08/29/2021  ? CALCIUM 8.2 (L) 08/29/2021  ? PROT 6.9 08/28/2021  ? ALBUMIN 2.8 (L) 08/28/2021  ? AST 27 08/28/2021  ? ALT 18 08/28/2021  ? ALKPHOS 52 08/28/2021  ? BILITOT 0.7 08/28/2021  ? GFRNONAA >60 08/29/2021  ? GFRAA >60 01/20/2020  ? ? ?Lab Results  ?Component Value Date  ? WBC 8.1 08/29/2021  ? NEUTROABS 7.0 08/29/2021  ? HGB 11.1 (L) 08/29/2021  ? HCT 34.7 (L) 08/29/2021  ? MCV 88.3 08/29/2021  ? PLT 242 08/29/2021  ? ? ? ?STUDIES: ?DG Chest 2 View ? ?Result Date: 08/28/2021 ?CLINICAL DATA:  Short of breath.  Lymphoma EXAM: CHEST - 2 VIEW COMPARISON:  Radiograph 08/16/2021 com PET-CT 07/08/2021 FINDINGS: Stable cardiac silhouette. Low lung volumes. Increased density in the LEFT lingular upper lobe and lower lobe opacity. RIGHT lung relatively clear. IMPRESSION: Increasing density in the LEFT  upper lobe and lower lobe concerning for pneumonia. Electronically Signed   By: Suzy Bouchard M.D.   On: 08/28/2021 12:39  ? ?DG Chest 2 View ? ?Result Date: 08/16/2021 ?CLINICAL DATA:  Persistent cough, rece

## 2021-08-29 NOTE — Progress Notes (Signed)
Initial Nutrition Assessment ? ?DOCUMENTATION CODES:  ?Not applicable ? ?INTERVENTION:  ?Liberalize diet to regular for advanced age and active cancer treatment ?Ensure Enlive po BID, each supplement provides 350 kcal and 20 grams of protein. ? ?NUTRITION DIAGNOSIS:  ?Increased nutrient needs related to cancer and cancer related treatments as evidenced by estimated needs. ? ?GOAL:  ?Patient will meet greater than or equal to 90% of their needs ? ?MONITOR:  ?PO intake, Supplement acceptance, Labs ? ?REASON FOR ASSESSMENT:  ?Malnutrition Screening Tool ?  ? ?ASSESSMENT:  ?81 y.o. 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 ? ?Called pt on room phone to discuss reported weight loss and poor PO PTA. Pt reports being tired and trying to take a nap. Pt reports that her poor appetite has been going on for several weeks. She is unsure of how much weight she has lost.  ? ?4.4% weight loss noted in the last month (2/24-3/29) which is not severe, but concerning due to pt's age and active cancer treatment. ? ?Pt reports eating a little bit for both meals so far today. Inquired about nutrition supplement use at home. Reports that she drink an ensure ~1x/d. Prefers strawberry. Will liberalize diet order and add supplements to support adequate nutrition intake.  ? ?Nutritionally Relevant Medications: ?Scheduled Meds: ? rosuvastatin  5 mg Oral Daily  ? ?Continuous Infusions: ? azithromycin 500 mg (08/29/21 1128)  ? cefTRIAXone (ROCEPHIN)  IV 2 g (08/29/21 1246)  ? ?PRN Meds: docusate sodium ? ?Labs Reviewed: ?Sodium 134 ?Hgb 11.1 ? ?NUTRITION - FOCUSED PHYSICAL EXAM: ?Defer to in-person assessment ? ?Diet Order:   ?Diet Order   ? ?       ?  Diet Heart Room service appropriate? Yes; Fluid consistency: Thin  Diet effective now       ?  ? ?  ?  ? ?   ? ? ?EDUCATION NEEDS:  ?No education needs have been identified at this time ? ?Skin:  Skin Assessment: Reviewed RN Assessment ? ?Last BM:  3/30 ? ?Height:  ?Ht Readings from Last 1 Encounters:  ?08/28/21 '5\' 10"'$  (1.778 m)  ? ?Weight:  ?Wt Readings from Last 1 Encounters:  ?08/28/21 106.5 kg  ? ?Ideal Body Weight:  68.2 kg ? ?BMI:  Body mass index is 33.69 kg/m?. ? ?Estimated Nutritional Needs:  ?Kcal:  2000-2200 kcal/d ?Protein:  100-110 g/d ?Fluid:  2L/d ? ? ?Ranell Patrick, RD, LDN ?Clinical Dietitian ?RD pager # available in [place]  ?After hours/weekend pager # available in [place] ?

## 2021-08-29 NOTE — Progress Notes (Signed)
?   08/29/21 0930  ?Clinical Encounter Type  ?Visited With Patient  ?Visit Type Initial  ?Referral From Other (Comment) ?(Spiritual Consult for Advance directive)  ?Spiritual Encounters  ?Spiritual Needs Prayer  ? ?Chaplain responded to Spiritual Consult for prayer and to provide information for advance directive ?

## 2021-08-29 NOTE — Progress Notes (Addendum)
?PROGRESS NOTE ? ? ? ?Michelle [name]  BOF:[id number] DOB: September 12, 1940 DOA: 08/28/2021 ?PCP: Burnard Hawthorne, FNP  ?Outpatient Specialists: oncology ? ? ? ?Brief Narrative:  ? ?Michelle [name] is a 81 y.o. 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 Patient has interim hx of covid March3 treated with molnupiravir with improvement of symptoms but noted residual cough. On March 17th patient was diagnosed with LLL pna  after having regression of recovery with  increasing cough and sob. Patient was  treated with Augmentin and completed 10 day course 2 days ago. Patient now presents with ED with worsening SOB and cough. Patient notes associated symptoms of fatigue,nausea and loose stools. She denies chest pain, palpitations,fever/chills/ or decrease appetite. ? ? ?Assessment & Plan: ?  ?Principal Problem: ?  CAP (community acquired pneumonia) ?Active Problems: ?  Rheumatoid arthritis ([place]) ?  LBBB (left bundle branch block) ?  Mantle cell lymphoma of lymph nodes of head, face, and neck ([place]) ?  Cardiomyopathy ([place]) ?  Chronic HFrEF (heart failure with reduced ejection fraction) ([place]) ? ?# CAP ?CT with multifocal pna. Had covid 1 month ago. Prescribed augmentin 1.5 weeks ago for CAP, didn't tolerate. Here with worsening dyspnea and cough. Is immune suppressed ?- f/u blood cultures, sputum culture, legionella and strep urine antigens.  ?- stop cefepime, start ceftriaxone and azithromycin ?- d/c vanc, f/u mrsa screen ?- f/u respiratory panel ?- f/u bnp and dimer, given pe risks would CTA if dimer elevated ? ?# Acute hypoxic respiratory failure ?Not on o2 at home, o2 in the upper 80s in the ED now normal on [place] O2 ?- [place] O2, wean as able ? ?# Lymphoma ?On rituxan, calequence at home, both on hold given infection ? ?# Debility ?- PT consult ? ?#  HFrEF ?Nonischemic cardiomyopathy. Appears compensated ?- hold home coreg, losart, spiro, lasix given hypotension ? ?# Hypotension ?Likely 2/2 acute illness ?- bp meds on hold ? ?# RA ?- rituxan on hold ? ?# OSA ?Not on cpap ? ? ? ?DVT prophylaxis: lovenox ?Code Status: full ?Family Communication: sister updated @ bedside ? ?Level of care: Telemetry Medical ?Status is: Inpatient ?Remains inpatient appropriate because: severity of illness ? ? ? ?Consultants:  ?none ? ?Procedures: ?none ? ?Antimicrobials:  ?Cefepime>ceftriaxone/azithromycin  ? ? ?Subjective: ?so ? ?Objective: ?Vitals:  ? 08/29/21 0524 08/29/21 0800 08/29/21 0837 08/29/21 0850  ?BP: (!) 91/56 (!) 95/58 90/76 101/64  ?Pulse: 74 95 96   ?Resp: 20 (!) 24 18 (!) 26  ?Temp: 98.2 ?F (36.8 ?C) 99 ?F (37.2 ?C) 97.6 ?F (36.4 ?C) 98.9 ?F (37.2 ?C)  ?TempSrc: Oral Oral  Oral  ?SpO2: 97% 98% 95% 100%  ?Weight:      ?Height:      ? ? ?Intake/Output Summary (Last 24 hours) at 08/29/2021 0956 ?Last data filed at 08/29/2021 7782 ?Gross per 24 hour  ?Intake 1376.84 ml  ?Output --  ?Net 1376.84 ml  ? ?Filed Weights  ? 08/28/21 1139 08/28/21 2011  ?Weight: 117.5 kg 106.5 kg  ? ? ?Examination: ? ?General exam: Appears calm and comfortable  ?Respiratory system: rales at bases, scattered rhonchi ?Cardiovascular system: S1 & S2 heard, RRR. No JVD, murmurs, rubs, gallops or clicks. No pedal edema. ?Gastrointestinal system: Abdomen is nondistended, soft and nontender. No organomegaly or masses felt. Normal bowel sounds heard. ?Central nervous system: Alert and oriented. No  focal neurological deficits. ?Extremities: Symmetric 5 x 5 power. ?Skin: No rashes, lesions or ulcers ?Psychiatry: Judgement and insight appear normal. Mood & affect appropriate.  ? ? ? ?Data Reviewed: I have personally reviewed following labs and imaging studies ? ?CBC: ?Recent Labs  ?Lab 08/28/21 ?1141 08/28/21 ?2014 08/29/21 ?7628  ?WBC 8.8 8.3 8.1  ?NEUTROABS 7.9*  --  7.0  ?HGB 12.6 11.6* 11.1*  ?HCT 39.4  36.1 34.7*  ?MCV 88.5 88.9 88.3  ?PLT 266 262 242  ? ?Basic Metabolic Panel: ?Recent Labs  ?Lab 08/28/21 ?1141 08/28/21 ?2014 08/29/21 ?3151  ?NA 135  --  134*  ?K 3.4*  --  3.8  ?CL 97*  --  99  ?CO2 28  --  27  ?GLUCOSE 105*  --  103*  ?BUN 9  --  10  ?CREATININE 0.67 0.75 0.65  ?CALCIUM 8.9  --  8.2*  ? ?GFR: ?Estimated Creatinine Clearance: 76.6 mL/min (by C-G formula based on SCr of 0.65 mg/dL). ?Liver Function Tests: ?Recent Labs  ?Lab 08/28/21 ?1141  ?AST 27  ?ALT 18  ?ALKPHOS 52  ?BILITOT 0.7  ?PROT 6.9  ?ALBUMIN 2.8*  ? ?No results for input(s): LIPASE, AMYLASE in the last 168 hours. ?No results for input(s): AMMONIA in the last 168 hours. ?Coagulation Profile: ?No results for input(s): INR, PROTIME in the last 168 hours. ?Cardiac Enzymes: ?No results for input(s): CKTOTAL, CKMB, CKMBINDEX, TROPONINI in the last 168 hours. ?BNP (last 3 results) ?No results for input(s): PROBNP in the last 8760 hours. ?HbA1C: ?No results for input(s): HGBA1C in the last 72 hours. ?CBG: ?No results for input(s): GLUCAP in the last 168 hours. ?Lipid Profile: ?No results for input(s): CHOL, HDL, LDLCALC, TRIG, CHOLHDL, LDLDIRECT in the last 72 hours. ?Thyroid Function Tests: ?No results for input(s): TSH, T4TOTAL, FREET4, T3FREE, THYROIDAB in the last 72 hours. ?Anemia Panel: ?No results for input(s): VITAMINB12, FOLATE, FERRITIN, TIBC, IRON, RETICCTPCT in the last 72 hours. ?Urine analysis: ?   ?Component Value Date/Time  ? COLORURINE YELLOW (A) 08/29/2021 0811  ? APPEARANCEUR HAZY (A) 08/29/2021 0811  ? APPEARANCEUR Clear 09/14/2013 1127  ? LABSPEC 1.031 (H) 08/29/2021 7616  ? LABSPEC 1.011 09/14/2013 1127  ? PHURINE 6.0 08/29/2021 0811  ? GLUCOSEU NEGATIVE 08/29/2021 0811  ? GLUCOSEU Negative 09/14/2013 1127  ? [place] NEGATIVE 08/29/2021 0811  ? [place] NEGATIVE 08/29/2021 0811  ? BILIRUBINUR Negative 09/14/2013 1127  ? KETONESUR 5 (A) 08/29/2021 0737  ? [place] NEGATIVE 08/29/2021 0811  ? NITRITE NEGATIVE 08/29/2021  0811  ? LEUKOCYTESUR NEGATIVE 08/29/2021 0811  ? LEUKOCYTESUR Negative 09/14/2013 1127  ? ?Sepsis Labs: ?'@LABRCNTIP'$ (procalcitonin:4,lacticidven:4) ? ?) ?Recent Results (from the past 240 hour(s))  ?Blood culture (routine x 2)     Status: None (Preliminary result)  ? Collection Time: 08/28/21  1:16 PM  ? Specimen: BLOOD  ?Result Value Ref Range Status  ? Specimen Description BLOOD BLOOD RIGHT FOREARM  Final  ? Special Requests   Final  ?  BOTTLES DRAWN AEROBIC AND ANAEROBIC Blood Culture results may not be optimal due to an inadequate volume of blood received in culture bottles  ? Culture   Final  ?  NO GROWTH < 24 HOURS ?Performed at [facility], [address], [place], [place] [address] ?  ? Report Status PENDING  Incomplete  ?Blood culture (routine x 2)     Status: None (Preliminary result)  ? Collection Time: 08/28/21  1:16 PM  ? Specimen: BLOOD  ?Result Value Ref Range Status  ?  Specimen Description BLOOD LEFT ANTECUBITAL  Final  ? Special Requests   Final  ?  BOTTLES DRAWN AEROBIC AND ANAEROBIC Blood Culture adequate volume  ? Culture   Final  ?  NO GROWTH < 24 HOURS ?Performed at [facility], [address], [place], [place] [address] ?  ? Report Status PENDING  Incomplete  ?Expectorated Sputum Assessment w Gram Stain, Rflx to Resp Cult     Status: None  ? Collection Time: 08/29/21  8:30 AM  ? Specimen: Sputum  ?Result Value Ref Range Status  ? Specimen Description SPUTUM  Final  ? Special Requests NONE  Final  ? Sputum evaluation   Final  ?  THIS SPECIMEN IS ACCEPTABLE FOR SPUTUM CULTURE ?Performed at [facility], [address], [place],  [address] ?  ? Report Status 08/29/2021 FINAL  Final  ?  ? ? ? ? ? ?Radiology Studies: ?DG Chest 2 View ? ?Result Date: 08/28/2021 ?CLINICAL DATA:  Short of breath.  Lymphoma EXAM: CHEST - 2 VIEW COMPARISON:  Radiograph 08/16/2021 com PET-CT 07/08/2021 FINDINGS: Stable cardiac silhouette. Low lung volumes. Increased density in the  LEFT lingular upper lobe and lower lobe opacity. RIGHT lung relatively clear. IMPRESSION: Increasing density in the LEFT upper lobe and lower lobe concerning for pneumonia. Electronically Signed   By: Meriel Pica

## 2021-08-30 DIAGNOSIS — I2699 Other pulmonary embolism without acute cor pulmonale: Secondary | ICD-10-CM | POA: Diagnosis present

## 2021-08-30 DIAGNOSIS — J189 Pneumonia, unspecified organism: Secondary | ICD-10-CM | POA: Diagnosis not present

## 2021-08-30 LAB — LEGIONELLA PNEUMOPHILA SEROGP 1 UR AG: L. pneumophila Serogp 1 Ur Ag: NEGATIVE

## 2021-08-30 MED ORDER — APIXABAN 5 MG PO TABS
5.0000 mg | ORAL_TABLET | Freq: Two times a day (BID) | ORAL | Status: DC
Start: 2021-09-06 — End: 2021-09-07
  Administered 2021-09-06 – 2021-09-07 (×2): 5 mg via ORAL
  Filled 2021-08-30 (×2): qty 1

## 2021-08-30 MED ORDER — APIXABAN 5 MG PO TABS
10.0000 mg | ORAL_TABLET | Freq: Two times a day (BID) | ORAL | Status: AC
  Administered 2021-08-30 – 2021-09-06 (×14): 10 mg via ORAL
  Filled 2021-08-30 (×14): qty 2

## 2021-08-30 MED ORDER — ONDANSETRON 4 MG PO TBDP
4.0000 mg | ORAL_TABLET | Freq: Four times a day (QID) | ORAL | Status: DC | PRN
Start: 2021-08-30 — End: 2021-09-07
  Administered 2021-08-30: 4 mg via ORAL
  Filled 2021-08-30: qty 1

## 2021-08-30 NOTE — Progress Notes (Signed)
ANTICOAGULATION CONSULT NOTE - Initial Consult ? ?Pharmacy Consult for apixaban ?Indication: pulmonary embolus ? ?No Known Allergies ? ?Patient Measurements: ?Height: '5\' 10"'$  (177.8 cm) ?Weight: 106.5 kg (234 lb 12.6 oz) ?IBW/kg (Calculated) : 68.5 ?Heparin Dosing Weight:   ? ?Vital Signs: ?Temp: 99 ?F (37.2 ?C) (03/31 1428) ?Temp Source: Oral (03/31 1428) ?BP: 96/60 (03/31 1428) ?Pulse Rate: 83 (03/31 1209) ? ?Labs: ?Recent Labs  ?  08/28/21 ?1141 08/28/21 ?2014 08/29/21 ?5993  ?HGB 12.6 11.6* 11.1*  ?HCT 39.4 36.1 34.7*  ?PLT 266 262 242  ?CREATININE 0.67 0.75 0.65  ? ? ?Estimated Creatinine Clearance: 76.6 mL/min (by C-G formula based on SCr of 0.65 mg/dL). ? ? ?Medical History: ?Past Medical History:  ?Diagnosis Date  ? Arthritis   ? Collagen vascular disease ([place])   ? GERD (gastroesophageal reflux disease)   ? Headache(784.0)   ? HFrEF (heart failure with reduced ejection fraction) ([place])   ? Nonischemic cardiomyopathy, LVEF as low as 30-35% in 08/2019  ? History of methotrexate therapy   ? Hyperlipidemia   ? hx  ? Lymphadenopathy of head and neck 01/2015  ? see on Thyroid ultrasound  ? Lymphoma, mantle cell ([place]) 06/01/2015  ? bx of lymph node in right breast/Stage IV Mantle Cell Lymphoma  ? Personal history of chemotherapy   ? Rheumatoid arthritis ([place])   ? ? ?Medications:  ?Scheduled:  ? apixaban  10 mg Oral BID  ? Followed by  ? [START ON 09/06/2021] apixaban  5 mg Oral BID  ? feeding supplement  237 mL Oral BID BM  ? rosuvastatin  5 mg Oral Daily  ? sodium chloride flush  3 mL Intravenous Q12H  ? ?Infusions:  ? sodium chloride    ? azithromycin 500 mg (08/30/21 1141)  ? cefTRIAXone (ROCEPHIN)  IV 2 g (08/30/21 1257)  ? ? ?Assessment: ?81 yo F to start apixaban for pulmonary embolism.  ?Hgb 11.1  plt 242 ? ? ?Goal of Therapy:  ?Monitor platelets by anticoagulation protocol: Yes ?  ?Plan:  ?Will order apixaban 10 mg po BID x 7 days followed by apixaban 5 mg po BID. ?Will discontinue prophylactic lovenox. ?F/u  cbc, Scrper protocol ? ?Danira Nylander A ?08/30/2021,3:54 PM ? ? ?

## 2021-08-30 NOTE — Progress Notes (Addendum)
?PROGRESS NOTE ? ? ? ?ALYN RIEDINGER  ZMO:[id number] DOB: December 12, 1940 DOA: 08/28/2021 ?PCP: Burnard Hawthorne, FNP  ?Outpatient Specialists: oncology ? ? ? ?Brief Narrative:  ? ?MOSETTA FERDINAND is a 81 y.o. 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 Patient has interim hx of covid March3 treated with molnupiravir with improvement of symptoms but noted residual cough. On March 17th patient was diagnosed with LLL pna  after having regression of recovery with  increasing cough and sob. Patient was  treated with Augmentin and completed 10 day course 2 days ago. Patient now presents with ED with worsening SOB and cough. Patient notes associated symptoms of fatigue,nausea and loose stools. She denies chest pain, palpitations,fever/chills/ or decrease appetite. ? ? ?Assessment & Plan: ?  ?Principal Problem: ?  CAP (community acquired pneumonia) ?Active Problems: ?  Rheumatoid arthritis ([place]) ?  LBBB (left bundle branch block) ?  Mantle cell lymphoma of lymph nodes of head, face, and neck ([place]) ?  Cardiomyopathy ([place]) ?  Chronic HFrEF (heart failure with reduced ejection fraction) ([place]) ? ?# CAP ?CT with multifocal pna. Had covid 1 month ago. Prescribed augmentin 1.5 weeks ago for CAP, didn't tolerate. Here with worsening dyspnea and cough. Is immune suppressed. Respiratory viral panel neg. CTA neg for PE. Urine neg for strep. Mrsa screen neg. BNP wnl ?- f/u blood cultures, sputum culture, legionella urine antigen ?- cont ceftriaxone and azithromycin (abx started 3/29) ? ?# Acute hypoxic respiratory failure ?Not on o2 at home, o2 in the upper 80s in the ED now normal on [place] O2 ?- [place] O2, wean as able ? ?# Pulmonary embolism ?I was called by radiology today with an over-read to yesterday's CTA. It shows a small PE with evidence pulmonary  hypertension. ?- will start eliquis ?- will check TTE ? ?# Lymphoma ?On rituxan, calequence at home (though pt has been holding since covid dx a month ago), both on hold given infection ?- onc following, advises no changes in current mgmt ? ?# Debility ?- PT consulted, advises HH PT ? ?# HFrEF ?Nonischemic cardiomyopathy. Appears compensated ?- hold home coreg, losart, spiro, lasix given hypotension ? ?# Hypotension ?Likely 2/2 acute illness. BP stable but low normal ?- bp meds on hold ? ?# RA ?- rituxan on hold ? ?# OSA ?Not on cpap ? ? ? ?DVT prophylaxis: lovenox ?Code Status: full ?Family Communication: brother updated @ bedside 3/31 ? ?Level of care: Telemetry Medical ?Status is: Inpatient ?Remains inpatient appropriate because: severity of illness ? ? ? ?Consultants:  ?none ? ?Procedures: ?none ? ?Antimicrobials:  ?Cefepime>ceftriaxone/azithromycin  ? ? ?Subjective: ?Shortness of breath improving. No pain. Tolerating PO ? ?Objective: ?Vitals:  ? 08/29/21 2114 08/29/21 2344 08/30/21 0549 08/30/21 0756  ?BP: (!) 91/59 (!) 85/54 102/62 96/61  ?Pulse: 81 69 82 83  ?Resp: '20 20 20 18  '$ ?Temp: 98.9 ?F (37.2 ?C) 98 ?F (36.7 ?C) 98.7 ?F (37.1 ?C) 99.3 ?F (37.4 ?C)  ?TempSrc:  Oral Oral Oral  ?SpO2: 97% 99% 99% 92%  ?Weight:      ?Height:      ? ? ?Intake/Output Summary (Last 24 hours) at 08/30/2021 1056 ?Last data filed at 08/29/2021 1246 ?Gross per 24 hour  ?Intake 350 ml  ?Output 700 ml  ?Net -350 ml  ? ?Filed Weights  ? 08/28/21 1139 08/28/21 2011  ?Weight: 117.5 kg 106.5 kg  ? ? ?  Examination: ? ?General exam: Appears calm and comfortable  ?Respiratory system: scattered rales ?Cardiovascular system: S1 & S2 heard, RRR. No JVD, murmurs, rubs, gallops or clicks. No pedal edema. ?Gastrointestinal system: Abdomen is nondistended, soft and nontender. No organomegaly or masses felt. Normal bowel sounds heard. ?Central nervous system: Alert and oriented. No focal neurological deficits. ?Extremities: Symmetric 5 x 5  power. ?Skin: No rashes, lesions or ulcers ?Psychiatry: Judgement and insight appear normal. Mood & affect appropriate.  ? ? ? ?Data Reviewed: I have personally reviewed following labs and imaging studies ? ?CBC: ?Recent Labs  ?Lab 08/28/21 ?1141 08/28/21 ?2014 08/29/21 ?5053  ?WBC 8.8 8.3 8.1  ?NEUTROABS 7.9*  --  7.0  ?HGB 12.6 11.6* 11.1*  ?HCT 39.4 36.1 34.7*  ?MCV 88.5 88.9 88.3  ?PLT 266 262 242  ? ?Basic Metabolic Panel: ?Recent Labs  ?Lab 08/28/21 ?1141 08/28/21 ?2014 08/29/21 ?9767  ?NA 135  --  134*  ?K 3.4*  --  3.8  ?CL 97*  --  99  ?CO2 28  --  27  ?GLUCOSE 105*  --  103*  ?BUN 9  --  10  ?CREATININE 0.67 0.75 0.65  ?CALCIUM 8.9  --  8.2*  ? ?GFR: ?Estimated Creatinine Clearance: 76.6 mL/min (by C-G formula based on SCr of 0.65 mg/dL). ?Liver Function Tests: ?Recent Labs  ?Lab 08/28/21 ?1141  ?AST 27  ?ALT 18  ?ALKPHOS 52  ?BILITOT 0.7  ?PROT 6.9  ?ALBUMIN 2.8*  ? ?No results for input(s): LIPASE, AMYLASE in the last 168 hours. ?No results for input(s): AMMONIA in the last 168 hours. ?Coagulation Profile: ?No results for input(s): INR, PROTIME in the last 168 hours. ?Cardiac Enzymes: ?No results for input(s): CKTOTAL, CKMB, CKMBINDEX, TROPONINI in the last 168 hours. ?BNP (last 3 results) ?No results for input(s): PROBNP in the last 8760 hours. ?HbA1C: ?No results for input(s): HGBA1C in the last 72 hours. ?CBG: ?No results for input(s): GLUCAP in the last 168 hours. ?Lipid Profile: ?No results for input(s): CHOL, HDL, LDLCALC, TRIG, CHOLHDL, LDLDIRECT in the last 72 hours. ?Thyroid Function Tests: ?No results for input(s): TSH, T4TOTAL, FREET4, T3FREE, THYROIDAB in the last 72 hours. ?Anemia Panel: ?No results for input(s): VITAMINB12, FOLATE, FERRITIN, TIBC, IRON, RETICCTPCT in the last 72 hours. ?Urine analysis: ?   ?Component Value Date/Time  ? COLORURINE YELLOW (A) 08/29/2021 0811  ? APPEARANCEUR HAZY (A) 08/29/2021 0811  ? APPEARANCEUR Clear 09/14/2013 1127  ? LABSPEC 1.031 (H) 08/29/2021 3419  ?  LABSPEC 1.011 09/14/2013 1127  ? PHURINE 6.0 08/29/2021 0811  ? GLUCOSEU NEGATIVE 08/29/2021 0811  ? GLUCOSEU Negative 09/14/2013 1127  ? [place] NEGATIVE 08/29/2021 0811  ? [place] NEGATIVE 08/29/2021 0811  ? BILIRUBINUR Negative 09/14/2013 1127  ? KETONESUR 5 (A) 08/29/2021 3790  ? [place] NEGATIVE 08/29/2021 0811  ? NITRITE NEGATIVE 08/29/2021 0811  ? LEUKOCYTESUR NEGATIVE 08/29/2021 0811  ? LEUKOCYTESUR Negative 09/14/2013 1127  ? ?Sepsis Labs: ?'@LABRCNTIP'$ (procalcitonin:4,lacticidven:4) ? ?) ?Recent Results (from the past 240 hour(s))  ?Blood culture (routine x 2)     Status: None (Preliminary result)  ? Collection Time: 08/28/21  1:16 PM  ? Specimen: BLOOD  ?Result Value Ref Range Status  ? Specimen Description BLOOD BLOOD RIGHT FOREARM  Final  ? Special Requests   Final  ?  BOTTLES DRAWN AEROBIC AND ANAEROBIC Blood Culture results may not be optimal due to an inadequate volume of blood received in culture bottles  ? Culture   Final  ?  NO GROWTH 2  DAYS ?Performed at [facility], [address], [place], [place] [address] ?  ? Report Status PENDING  Incomplete  ?Blood culture (routine x 2)     Status: None (Preliminary result)  ? Collection Time: 08/28/21  1:16 PM  ? Specimen: BLOOD  ?Result Value Ref Range Status  ? Specimen Description BLOOD LEFT ANTECUBITAL  Final  ? Special Requests   Final  ?  BOTTLES DRAWN AEROBIC AND ANAEROBIC Blood Culture adequate volume  ? Culture   Final  ?  NO GROWTH 2 DAYS ?Performed at [facility] [address] [place], [place] [address] ?  ? Report Status PENDING  Incomplete  ?Expectorated Sputum Assessment w Gram Stain, Rflx to Resp Cult     Status: None  ? Collection Time: 08/29/21  8:30 AM  ? Specimen: Sputum  ?Result Value Ref Range Status  ? Specimen Description SPUTUM  Final  ? Special Requests NONE  Final  ? Sputum evaluation   Final  ?  THIS SPECIMEN IS ACCEPTABLE FOR SPUTUM CULTURE ?Performed at [facility], [address]  [place], [place] [address] ?  ? Report Status 08/29/2021 FINAL  Final  ?MRSA Next Gen by PCR, Nasal     Status: None  ? Collection Time: 08/29/21  8:30 AM  ? Specimen: Nasal Swab  ?Result Value Ref Range Status  ? MRSA by PCR

## 2021-08-30 NOTE — TOC Initial Note (Signed)
Transition of Care (TOC) - Initial/Assessment Note  ? ? ?Patient Details  ?Name: Michelle [name] ?MRN: [id number] ?Date of Birth: May 21, 1941 ? ?Transition of Care (TOC) CM/SW Contact:    ?Mitsuko Luera E Rimas [place], LCSW ?Phone Number: ?08/30/2021, 1:38 PM ? ?Clinical Narrative:                Spoke to patient regarding DC planning. ?Patient lives with her husband. Was driving herself to appointments, but says family can take her if needed. ?PCP is Mable Paris. Patient uses a RW.  ?Patient is agreeable to [facility], no agency preference and confirmed home address on chart. Referral made to [facility].  ?Will continue to follow for needs.  ? ? ?Expected Discharge Plan: [place] ?Barriers to Discharge: Continued Medical Work up ? ? ?Patient Goals and CMS Choice ?Patient states their goals for this hospitalization and ongoing recovery are:: home with home health ?CMS Medicare.gov Compare Post Acute Care list provided to:: Patient ?Choice offered to / list presented to : Patient ? ?Expected Discharge Plan and Services ?Expected Discharge Plan: [place] ?  ?  ?  ?Living arrangements for the past 2 months: [place] ?                ?  ?  ?  ?  ?  ?HH Arranged: PT ?[place] Agency: [facility] ?Date HH Agency Contacted: 08/30/21 ?  ?Representative spoke with at [place]: Merleen Nicely ? ?Prior Living Arrangements/Services ?Living arrangements for the past 2 months: [place] ?Lives with:: Spouse ?Patient language and need for interpreter reviewed:: Yes ?Do you feel safe going back to the place where you live?: Yes      ?Need for Family Participation in Patient Care: Yes (Comment) ?Care giver support system in place?: Yes (comment) ?Current home services: DME ?Criminal Activity/Legal Involvement Pertinent to Current Situation/Hospitalization: No - Comment as needed ? ?Activities of Daily Living ?Home Assistive Devices/Equipment: Chana Bode (specify type) (front  wheel) ?ADL Screening (condition at time of admission) ?Patient's cognitive ability adequate to safely complete daily activities?: Yes ?Is the patient deaf or have difficulty hearing?: No ?Does the patient have difficulty seeing, even when wearing glasses/contacts?: No ?Does the patient have difficulty concentrating, remembering, or making decisions?: No ?Patient able to express need for assistance with ADLs?: Yes ?Does the patient have difficulty dressing or bathing?: Yes ?Independently performs ADLs?: No ?Does the patient have difficulty walking or climbing stairs?: Yes ?Weakness of Legs: Both ?Weakness of Arms/Hands: None ? ?Permission Sought/Granted ?Permission sought to share information with : Customer service manager ?Permission granted to share information with : Yes, Verbal Permission Granted ?   ? Permission granted to share info w AGENCY: [place] agencies ?   ?   ? ?Emotional Assessment ?  ?  ?  ?Orientation: : Oriented to Self, Oriented to Place, Oriented to  Time, Oriented to Situation ?Alcohol / Substance Use: Not Applicable ?Psych Involvement: No (comment) ? ?Admission diagnosis:  CAP (community acquired pneumonia) [J18.9] ?HCAP (healthcare-associated pneumonia) [J18.9] ?Patient Active Problem List  ? Diagnosis Date Noted  ? CAP (community acquired pneumonia) 08/28/2021  ? Chronic HFrEF (heart failure with reduced ejection fraction) ([place]) 07/18/2021  ? OSA (obstructive sleep apnea) 01/24/2021  ? Atherosclerosis of aorta ([place]) 10/12/2020  ? Adnexal mass 10/12/2020  ? Hx of sleep apnea 09/11/2020  ? Balance problems 08/10/2020  ? Cardiomyopathy ([place]) 09/20/2019  ? Elevated blood pressure reading 07/15/2019  ?  Dyspnea on exertion 12/17/2018  ? Arthralgia 12/17/2018  ? Abnormal CT scan, chest 11/12/2018  ? Allergic reaction 09/25/2017  ? HLD (hyperlipidemia) 02/03/2017  ? Drug-induced neutropenia ([place]) 08/17/2015  ? Mantle cell lymphoma of lymph nodes of head, face, and neck ([place]) 07/12/2015  ? Severe  obesity (BMI >= 40) ([place]) 01/12/2014  ? Screening for osteoporosis 01/12/2014  ? LBBB (left bundle branch block) 09/10/2012  ? Left thyroid nodule 11/13/2011  ? Anxiety 10/13/2011  ? Rheumatoid arthritis ([place]) 09/18/2011  ? GERD (gastroesophageal reflux disease) 09/18/2011  ? ?PCP:  Burnard Hawthorne, FNP ?Pharmacy:   ?[id number]?Phone: [phone]: [phone]? ?[place]?Phone: [phone]: [phone]? ? ? ? ?Social Determinants of Health (SDOH) Interventions ?  ? ?Readmission Risk Interventions ? ?  08/30/2021  ?  1:35 PM  ?Readmission Risk Prevention Plan  ?Post Dischage Appt Complete  ?Medication Screening Complete  ?Transportation Screening Complete  ? ? ? ?

## 2021-08-30 NOTE — Plan of Care (Signed)
?  Problem: Clinical Measurements: ?Goal: Ability to maintain clinical measurements within normal limits will improve ?Outcome: Progressing ?Goal: Will remain free from infection ?Outcome: Progressing ?Goal: Diagnostic test results will improve ?Outcome: Progressing ?Goal: Respiratory complications will improve ?Outcome: Progressing ?Goal: Cardiovascular complication will be avoided ?Outcome: Progressing ?  ?Problem: Activity: ?Goal: Risk for activity intolerance will decrease ?Outcome: Progressing ?  ?Problem: Safety: ?Goal: Ability to remain free from injury will improve ?Outcome: Progressing ?  ?Problem: Skin Integrity: ?Goal: Risk for impaired skin integrity will decrease ?Outcome: Progressing ?  ?Problem: Respiratory: ?Goal: Ability to maintain adequate ventilation will improve ?Outcome: Progressing ?Goal: Ability to maintain a clear airway will improve ?Outcome: Progressing ?  ?

## 2021-08-30 NOTE — Care Management Important Message (Signed)
Important Message ? ?Patient Details  ?Name: Michelle [name] ?MRN: [id number] ?Date of Birth: [birth year] ? ? ?Medicare Important Message Given:  N/A - LOS <3 / Initial given by admissions ? ? ? ? ?[name] ?08/30/2021, 7:47 AM ?

## 2021-08-30 NOTE — Progress Notes (Signed)
Notified provider Randol Kern of patient's bp being low and previous fever. Respirations are showing in 30s on monitor but is 25 bpm when checked for a full minute manually. Patient is positive for pneumonia and a small PE.  ?

## 2021-08-30 NOTE — Progress Notes (Signed)
Nutrition Follow-up ? ?DOCUMENTATION CODES:  ? ?Not applicable ? ?INTERVENTION:  ?- Encourage adequate PO intake ?- Increase Ensure Enlive po from BID to TID, each supplement provides 350 kcal and 20 grams of protein. ? ?NUTRITION DIAGNOSIS:  ? ?Increased nutrient needs related to cancer and cancer related treatments as evidenced by estimated needs. ? ?Ongoing ? ?GOAL:  ? ?Patient will meet greater than or equal to 90% of their needs ? ?Not being met- addressing needs via meals and nutrition supplements ? ?MONITOR:  ? ?PO intake, Supplement acceptance, Labs ? ?REASON FOR ASSESSMENT:  ? ?Malnutrition Screening Tool ?  ? ?ASSESSMENT:  ? ?81 y.o. 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 ? ?No documented meal completions. ? ?Pt states that she has had a poor appetite for several weeks, however she does try to take a few bites of foods provided. Noticed lunch tray on side table with sandwich, chips and Ensure. Pt states that she is tired and will eat later. She endorses intermittent nausea of which she is given zofran. Recommended pt try to have some food after taking antiemetics to see if this helps increase her intake. She is familiar with Ensure and has been trying to drink them during admission. We discussed continuing to drink these after d/c from hospital. She denies any sensitivity to silverware with chemotherapy, however does mention that her oncologist recommended she use plasticware when eating. ? ?PTA pt was having to use a walker for mobility. Pt states that her usual body weight is 259 lbs. She is unsure how much but does suspect she has had recent weight loss. Reviewed weight history. Noted pt with a 4% weight loss within the last month which is not significant for time frame. Will continue to monitor. ? ?Medications: IV  abx ? ?Labs: sodium 134 ? ?NUTRITION - FOCUSED PHYSICAL EXAM: ? ?Flowsheet Row Most Recent Value  ?Orbital Region No depletion  ?Upper Arm Region No depletion  ?Thoracic and Lumbar Region No depletion  ?Buccal Region No depletion  ?Temple Region No depletion  ?Clavicle Bone Region Mild depletion  ?Clavicle and Acromion Bone Region No depletion  ?Scapular Bone Region No depletion  ?Dorsal Hand Mild depletion  ?Patellar Region Mild depletion  ?Anterior Thigh Region Mild depletion  ?Posterior Calf Region No depletion  ?Edema (RD Assessment) None  ?Hair Reviewed  ?Eyes Reviewed  ?Mouth Reviewed  ?Skin Reviewed  ?Nails Reviewed  ? ?  ? ? ?Diet Order:   ?Diet Order   ? ?       ?  Diet regular Room service appropriate? Yes; Fluid consistency: Thin  Diet effective now       ?  ? ?  ?  ? ?  ? ? ?EDUCATION NEEDS:  ? ?No education needs have been identified at this time ? ?Skin:  Skin Assessment: Reviewed RN Assessment ? ?Last BM:  3/30 ? ?Height:  ? ?Ht Readings from Last 1 Encounters:  ?08/28/21 _0  (1.778 m)  ? ? ?Weight:  ? ?Wt Readings from Last 1 Encounters:  ?08/28/21 106.5 kg  ? ? ?Ideal Body Weight:  68.2 kg ? ?BMI:  Body mass index is 33.69 kg/m?. ? ?Estimated Nutritional Needs:  ? ?Kcal:  2000-2200 kcal/d ? ?Protein:  100-110 g/d ? ?Fluid:  2L/d ? ?[name], RDN, LDN ?Clinical Nutrition ?

## 2021-08-30 NOTE — Progress Notes (Signed)
PT Cancellation Note ? ?Patient Details ?Name: Michelle [name] ?MRN: [id number] ?DOB: 08-Aug-1940 ? ? ?Cancelled Treatment:    Reason Eval/Treat Not Completed: Patient declined, no reason specified. Patient requests that I come back later, states she cannot walk right now.  ? ? ?Katelind Pytel ?08/30/2021, 11:00 AM ?

## 2021-08-30 NOTE — Progress Notes (Signed)
Patient was running fevers earlier today and tech took bp that was low. Rechecked manually and her bp was still low. MD Pamala Hurry asked is her mentation had changed which it has not and remains at baseline. Checked respiirations and they were elevated at 25 bpm. Rechecked entire v/s and it resulted in yellow MEWS. Informed provider, charge nurse, assigned tech and family(niece that was in room). No new orders given. ?

## 2021-08-31 ENCOUNTER — Inpatient Hospital Stay
Admit: 2021-08-31 | Discharge: 2021-08-31 | Disposition: A | Discharge status: Home / Self Care | Payer: Medicare HMO | Attending: Obstetrics and Gynecology | Admitting: Obstetrics and Gynecology

## 2021-08-31 DIAGNOSIS — J189 Pneumonia, unspecified organism: Secondary | ICD-10-CM | POA: Diagnosis not present

## 2021-08-31 DIAGNOSIS — I447 Left bundle-branch block, unspecified: Secondary | ICD-10-CM

## 2021-08-31 DIAGNOSIS — I5022 Chronic systolic (congestive) heart failure: Secondary | ICD-10-CM | POA: Diagnosis not present

## 2021-08-31 DIAGNOSIS — C8311 Mantle cell lymphoma, lymph nodes of head, face, and neck: Secondary | ICD-10-CM

## 2021-08-31 DIAGNOSIS — I2699 Other pulmonary embolism without acute cor pulmonale: Secondary | ICD-10-CM

## 2021-08-31 DIAGNOSIS — M069 Rheumatoid arthritis, unspecified: Secondary | ICD-10-CM

## 2021-08-31 LAB — CULTURE, RESPIRATORY W GRAM STAIN: Culture: NORMAL

## 2021-08-31 LAB — ECHOCARDIOGRAM COMPLETE BUBBLE STUDY
Area-P 1/2: 4.46 cm2
Calc EF: 49.7 %
S' Lateral: 3.3 cm
Single Plane A2C EF: 48.5 %
Single Plane A4C EF: 44.9 %

## 2021-08-31 MED ORDER — HYDROCODONE BIT-HOMATROP MBR 5-1.5 MG/5ML PO SOLN
5.0000 mL | Freq: Four times a day (QID) | ORAL | Status: AC | PRN
  Administered 2021-08-31 – 2021-09-01 (×2): 5 mL via ORAL
  Filled 2021-08-31 (×2): qty 5

## 2021-08-31 MED ORDER — GUAIFENESIN-DM 100-10 MG/5ML PO SYRP
5.0000 mL | ORAL_SOLUTION | ORAL | Status: DC | PRN
  Administered 2021-09-01 – 2021-09-06 (×9): 5 mL via ORAL
  Filled 2021-08-31 (×11): qty 10

## 2021-08-31 NOTE — Progress Notes (Signed)
?PROGRESS NOTE ? ? ? ?Michelle [name]  VOJ:[id number] DOB: 1941/04/24 DOA: 08/28/2021 ?PCP: Burnard Hawthorne, FNP  ? ?Brief Narrative:  ?Michelle [name] is a 81 y.o. 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 Patient has interim hx of covid March3 treated with molnupiravir with improvement of symptoms but noted residual cough. On March 17th patient was diagnosed with LLL pna  after having regression of recovery with  increasing cough and sob. Patient was  treated with Augmentin and completed 10 day course 2 days ago. Patient now presents with ED with worsening SOB and cough. Patient notes associated symptoms of fatigue,nausea and loose stools. She denies chest pain, palpitations,fever/chills/ or decrease appetite. ? ? ?Assessment & Plan: ?  ?Principal Problem: ?  CAP (community acquired pneumonia) ?Active Problems: ?  Rheumatoid arthritis ([place]) ?  LBBB (left bundle branch block) ?  Mantle cell lymphoma of lymph nodes of head, face, and neck ([place]) ?  Cardiomyopathy ([place]) ?  Chronic HFrEF (heart failure with reduced ejection fraction) ([place]) ?  Acute pulmonary embolism ([place]) ? ? ?# CAP ?CT with multifocal pna. Had covid 1 month ago. Prescribed augmentin 1.5 weeks ago for CAP, didn't tolerate. Here with worsening dyspnea and cough. Is immune suppressed. Respiratory viral panel neg. CTA neg for PE. Urine neg for strep. Mrsa screen neg. BNP wnl ?- blood cultures NEG X 3 DAYS,  ?- NLsputum culture,  ?- cont ceftriaxone and azithromycin (abx started 3/29) ?  ?# Acute hypoxic respiratory failure ?Not on o2 at home, o2 in the upper 80s in the ED now normal on [place] O2 ?- [place] O2, wean as able, Currently 2L ?  ?# Pulmonary embolism ?I was called by radiology today with an over-read to yesterday's CTA. It shows a small PE with evidence pulmonary  hypertension. ?- will start eliquis ?- f/up TTE ?  ?# Lymphoma ?On rituxan, calequence at home (though pt has been holding since covid dx a month ago), both on hold given infection ?- onc following, advises no changes in current mgmt ?  ?# Debility ?- PT consulted, advises HH PT ?  ?# HFrEF ?Nonischemic cardiomyopathy. Appears compensated ?- hold home coreg, losart, spiro, lasix given hypotension ?  ?# Hypotension ?Likely 2/2 acute illness. BP stable but low normal ?- bp meds on hold ?  ?# RA ?- rituxan on hold ?  ?# OSA ?Not on cpap ? ?DVT prophylaxis: Lovenox SQ  ?Code Status: FULL ? ?  ?Code Status Orders  ?(From admission, onward)  ?  ? ? ?  ? ?  Start     Ordered  ? 08/28/21 1452  Full code  Continuous       ? 08/28/21 1456  ? ?  ?  ? ?  ? ?Code Status History   ? ? Date Active Date Inactive Code Status Order ID Comments User Context  ? 09/20/2019 0847 09/20/2019 1610 Full Code [id number]  Nelva Bush, MD Inpatient  ? ?  ? ?Family Communication: NIECES AT BEDSIDE  ?Disposition Plan:   PT WILL [facility] INPT FOR CONRTINUED IV ABX AND RESP SUPPORT,  ?Consults called: None ?Admission status: Inpatient ? ? ?Consultants:  ?None ? ?Procedures:  ?DG Chest 2 View ? ?Result Date: 08/28/2021 ?CLINICAL DATA:  Short of breath.  Lymphoma EXAM: CHEST - 2 VIEW COMPARISON:  Radiograph 08/16/2021 com PET-CT 07/08/2021 FINDINGS: Stable cardiac silhouette.  Low lung volumes. Increased density in the LEFT lingular upper lobe and lower lobe opacity. RIGHT lung relatively clear. IMPRESSION: Increasing density in the LEFT upper lobe and lower lobe concerning for pneumonia. Electronically Signed   By: Suzy Bouchard M.D.   On: 08/28/2021 12:39  ? ?DG Chest 2 View ? ?Result Date: 08/16/2021 ?CLINICAL DATA:  Persistent cough, recent COVID EXAM: CHEST - 2 VIEW COMPARISON:  03/12/2018 FINDINGS: Cardiomegaly. Heterogeneous airspace opacity of the left lung base. Disc degenerative disease of the thoracic spine. IMPRESSION: Heterogeneous  airspace opacity of the left lung base, concerning for infection or aspiration. Electronically Signed   By: Delanna Ahmadi M.D.   On: 08/16/2021 14:46  ? ?CT CHEST W CONTRAST ? ?Result Date: 08/28/2021 ?CLINICAL DATA:  Shortness of breath. EXAM: CT CHEST WITH CONTRAST TECHNIQUE: Multidetector CT imaging of the chest was performed during intravenous contrast administration. RADIATION DOSE REDUCTION: This exam was performed according to the departmental dose-optimization program which includes automated exposure control, adjustment of the mA and/or kV according to patient size and/or use of iterative reconstruction technique. CONTRAST:  68m OMNIPAQUE IOHEXOL 300 MG/ML  SOLN COMPARISON:  April 17, 2020. FINDINGS: Cardiovascular: Atherosclerosis of thoracic aorta is noted without aneurysm or dissection. Mild cardiomegaly is noted. No pericardial effusion is noted. Enlarged pulmonary artery is noted suggesting pulmonary hypertension. Mediastinum/Nodes: No enlarged mediastinal, hilar, or axillary lymph nodes. Thyroid gland, trachea, and esophagus demonstrate no significant findings. Lungs/Pleura: No pneumothorax or pleural effusion is noted. There is interval development of patchy airspace opacities throughout both lungs concerning for multifocal pneumonia. Upper Abdomen: No acute abnormality. Musculoskeletal: No chest wall abnormality. No acute or significant osseous findings. IMPRESSION: Interval development of patchy airspace opacities throughout both lungs most consistent with multifocal pneumonia. Stable enlarged pulmonary artery is noted suggesting pulmonary artery hypertension. Aortic Atherosclerosis (ICD10-I70.0). Electronically Signed   By: JMarijo ConceptionM.D.   On: 08/28/2021 15:20  ? ?CT Angio Chest Pulmonary Embolism (PE) W or WO Contrast ? ?Addendum Date: 08/30/2021   ?ADDENDUM REPORT: 08/30/2021 15:27 ADDENDUM: Findings of pulmonary embolism were reported to Dr. WSi Raiderat approximately 3:20 p.m. on  08/30/2021. Electronically Signed   By: IKeane PoliceD.O.   On: 08/30/2021 15:27  ? ?Addendum Date: 08/30/2021   ?ADDENDUM REPORT: 08/30/2021 15:08 ADDENDUM: On further review there is filling defect in the right upper lobe pulmonary artery branches (series 4, image 28; series 7 image 49-52), consistent with pulmonary embolism. There is also enlarged pulmonary artery measuring up to 4.2 cm concerning for pulmonary arterial hypertension. Electronically Signed   By: IKeane PoliceD.O.   On: 08/30/2021 15:08  ? ?Result Date: 08/30/2021 ?CLINICAL DATA:  Dyspnea EXAM: CT ANGIOGRAPHY CHEST WITH CONTRAST TECHNIQUE: Multidetector CT imaging of the chest was performed using the standard protocol during bolus administration of intravenous contrast. Multiplanar CT image reconstructions and MIPs were obtained to evaluate the vascular anatomy. RADIATION DOSE REDUCTION: This exam was performed according to the departmental dose-optimization program which includes automated exposure control, adjustment of the mA and/or kV according to patient size and/or use of iterative reconstruction technique. CONTRAST:  712mOMNIPAQUE IOHEXOL 350 MG/ML SOLN COMPARISON:  CT chest dated August 28, 2021 FINDINGS: Cardiovascular: Satisfactory opacification of the pulmonary arteries to the segmental level. No evidence of pulmonary embolism. Normal heart size. No pericardial effusion. Main pulmonary trunk is dilated measuring up to 4.2 cm concerning for pulmonary artery hypertension. Mediastinum/Nodes: No enlarged mediastinal, hilar, or axillary lymph nodes. Thyroid gland, trachea, and esophagus  demonstrate no significant findings. Lungs/Pleura: There are multifocal bilateral patchy and ground-glass lung opacities consistent with multifocal pneumonia. No pleural effusion or pneumothorax. Upper Abdomen: No acute abnormality. Musculoskeletal: Multilevel DA and disc disease of the thoracic spine. No acute osseous abnormality. Review of the MIP images  confirms the above findings. IMPRESSION: 1.  No evidence of pulmonary embolism. 2. Multifocal bilateral patchy and ground-glass lung opacities consistent with severe multifocal pneumonia. No pleural effusion or pneumothorax.

## 2021-08-31 NOTE — Plan of Care (Signed)
?  Problem: Clinical Measurements: ?Goal: Ability to maintain clinical measurements within normal limits will improve ?Outcome: Progressing ?Goal: Will remain free from infection ?Outcome: Progressing ?Goal: Diagnostic test results will improve ?Outcome: Progressing ?Goal: Respiratory complications will improve ?Outcome: Progressing ?Goal: Cardiovascular complication will be avoided ?Outcome: Progressing ?  ?Problem: Activity: ?Goal: Risk for activity intolerance will decrease ?Outcome: Progressing ?  ?Problem: Safety: ?Goal: Ability to remain free from injury will improve ?Outcome: Progressing ?  ?Problem: Skin Integrity: ?Goal: Risk for impaired skin integrity will decrease ?Outcome: Progressing ?  ?Problem: Respiratory: ?Goal: Ability to maintain adequate ventilation will improve ?Outcome: Progressing ?Goal: Ability to maintain a clear airway will improve ?Outcome: Progressing ?  ?

## 2021-09-01 ENCOUNTER — Inpatient Hospital Stay: Payer: Medicare HMO

## 2021-09-01 DIAGNOSIS — I447 Left bundle-branch block, unspecified: Secondary | ICD-10-CM | POA: Diagnosis not present

## 2021-09-01 DIAGNOSIS — I5022 Chronic systolic (congestive) heart failure: Secondary | ICD-10-CM | POA: Diagnosis not present

## 2021-09-01 DIAGNOSIS — I2699 Other pulmonary embolism without acute cor pulmonale: Secondary | ICD-10-CM | POA: Diagnosis not present

## 2021-09-01 DIAGNOSIS — J189 Pneumonia, unspecified organism: Secondary | ICD-10-CM | POA: Diagnosis not present

## 2021-09-01 LAB — BASIC METABOLIC PANEL
Anion gap: 7 (ref 5–15)
BUN: 10 mg/dL (ref 8–23)
CO2: 30 mmol/L (ref 22–32)
Calcium: 8.4 mg/dL — ABNORMAL LOW (ref 8.9–10.3)
Chloride: 101 mmol/L (ref 98–111)
Creatinine, Ser: 0.65 mg/dL (ref 0.44–1.00)
GFR, Estimated: >60 mL/min (ref >60)
Glucose, Bld: 103 mg/dL — ABNORMAL HIGH (ref 70–99)
Potassium: 3.5 mmol/L (ref 3.5–5.1)
Sodium: 138 mmol/L (ref 135–145)

## 2021-09-01 LAB — CBC WITH DIFFERENTIAL/PLATELET
Abs Immature Granulocytes: 0.04 10*3/uL (ref 0.00–0.07)
Basophils Absolute: 0 10*3/uL (ref 0.0–0.1)
Basophils Relative: 0 %
Eosinophils Absolute: 0.3 10*3/uL (ref 0.0–0.5)
Eosinophils Relative: 3 %
HCT: 31.4 % — ABNORMAL LOW (ref 36.0–46.0)
Hemoglobin: 10 g/dL — ABNORMAL LOW (ref 12.0–15.0)
Immature Granulocytes: 1 %
Lymphocytes Relative: 7 %
Lymphs Abs: 0.5 10*3/uL — ABNORMAL LOW (ref 0.7–4.0)
MCH: 28.2 pg (ref 26.0–34.0)
MCHC: 31.8 g/dL (ref 30.0–36.0)
MCV: 88.7 fL (ref 80.0–100.0)
Monocytes Absolute: 0.3 10*3/uL (ref 0.1–1.0)
Monocytes Relative: 4 %
Neutro Abs: 6.5 10*3/uL (ref 1.7–7.7)
Neutrophils Relative %: 85 %
Platelets: 237 10*3/uL (ref 150–400)
RBC: 3.54 MIL/uL — ABNORMAL LOW (ref 3.87–5.11)
RDW: 13.8 % (ref 11.5–15.5)
WBC: 7.6 10*3/uL (ref 4.0–10.5)
nRBC: 0 % (ref 0.0–0.2)

## 2021-09-01 MED ORDER — BENZONATATE 100 MG PO CAPS
200.0000 mg | ORAL_CAPSULE | Freq: Three times a day (TID) | ORAL | Status: DC
  Administered 2021-09-01 – 2021-09-07 (×18): 200 mg via ORAL
  Filled 2021-09-01 (×18): qty 2

## 2021-09-01 NOTE — Progress Notes (Signed)
?PROGRESS NOTE ? ? ? ?Michelle [name]  AVW:[id number] DOB: [birth year] DOA: 08/28/2021 ?PCP: Burnard Hawthorne, FNP  ? ?Brief Narrative:  ?Michelle [name] is a 81 y.o. 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 Patient has interim hx of covid March3 treated with molnupiravir with improvement of symptoms but noted residual cough. On March 17th patient was diagnosed with LLL pna  after having regression of recovery with  increasing cough and sob. Patient was  treated with Augmentin and completed 10 day course 2 days ago. Patient now presents with ED with worsening SOB and cough. Patient notes associated symptoms of fatigue,nausea and loose stools. She denies chest pain, palpitations,fever/chills/ or decrease appetite ? ?Pt wis being treated with IV abx and is slow to recover, complicated by immunosuppression in setting of stage 4 mantle lymphoma ? ?Fever late yesterday 100.5 ? ? ?Assessment & Plan: ?  ?Principal Problem: ?  CAP (community acquired pneumonia) ?Active Problems: ?  Rheumatoid arthritis ([place]) ?  LBBB (left bundle branch block) ?  Mantle cell lymphoma of lymph nodes of head, face, and neck ([place]) ?  Cardiomyopathy ([place]) ?  Chronic HFrEF (heart failure with reduced ejection fraction) ([place]) ?  Acute pulmonary embolism ([place]) ? ? ?# CAP ?CT with multifocal pna. Had covid 1 month ago. Prescribed augmentin 1.5 weeks ago for CAP, didn't tolerate. Here with worsening dyspnea and cough. Is immune suppressed. Respiratory viral panel neg. CTA neg for PE. Urine neg for strep. Mrsa screen neg. BNP wnl ?- blood cultures NEG X 4 DAYS,  ?- NLsputum culture,  ?- cont ceftriaxone and azithromycin (abx started 3/29) ?- if no improvement consider CT chest and ID input ?  ?# Acute hypoxic respiratory failure ?Not on o2 at home, o2 in the upper 80s  in the ED now normal on [place] O2 ?- [place] O2, wean as able, Currently 2-3L ?  ?# Pulmonary embolism ?-Over-read of CTA shows a small PE with evidence stable pulmonary hypertension. ?- continue eliquis, started here ?  ?# Lymphoma ?On rituxan, calequence at home (though pt has been holding since covid dx a month ago), both on hold given infection ?- onc following, advises no changes in current mgmt ?  ?# Debility ?- PT consulted, advises HH PT ?  ?# HFrEF ?Nonischemic cardiomyopathy. Appears compensated ?- hold home coreg, losart, spiro, lasix given hypotension ?  ?# Hypotension ?Likely 2/2 acute illness. BP stable but low normal ?- bp meds on hold ?  ?# RA ?- rituxan on hold ?  ?# OSA ?Not on cpap ?  ?DVT prophylaxis: Lovenox SQ  ?Code Status: FULL ? ?  ?Code Status Orders  ?(From admission, onward)  ?  ? ? ?  ? ?  Start     Ordered  ? 08/28/21 1452  Full code  Continuous       ? 08/28/21 1456  ? ?  ?  ? ?  ? ?Code Status History   ? ? Date Active Date Inactive Code Status Order ID Comments User Context  ? 09/20/2019 0847 09/20/2019 1610 Full Code [id number]  Nelva Bush, MD Inpatient  ? ?  ? ?Family Communication: discussed with patient today, nieces on Saturday, agree on plan of car  ?Disposition Plan:   PT WILL [facility] INPT FOR CONRTINUED IV ABX AND RESP SUPPORT, ?Consults called: None ?Admission status: Inpatient ? ? ?Consultants:  ?  None ? ?Procedures:  ?DG Chest 2 View ? ?Result Date: 08/28/2021 ?CLINICAL DATA:  Short of breath.  Lymphoma EXAM: CHEST - 2 VIEW COMPARISON:  Radiograph 08/16/2021 com PET-CT 07/08/2021 FINDINGS: Stable cardiac silhouette. Low lung volumes. Increased density in the LEFT lingular upper lobe and lower lobe opacity. RIGHT lung relatively clear. IMPRESSION: Increasing density in the LEFT upper lobe and lower lobe concerning for pneumonia. Electronically Signed   By: Suzy Bouchard M.D.   On: 08/28/2021 12:39  ? ?DG Chest 2 View ? ?Result Date: 08/16/2021 ?CLINICAL DATA:  Persistent cough,  recent COVID EXAM: CHEST - 2 VIEW COMPARISON:  03/12/2018 FINDINGS: Cardiomegaly. Heterogeneous airspace opacity of the left lung base. Disc degenerative disease of the thoracic spine. IMPRESSION: Heterogeneous airspace opacity of the left lung base, concerning for infection or aspiration. Electronically Signed   By: Delanna Ahmadi M.D.   On: 08/16/2021 14:46  ? ?CT CHEST W CONTRAST ? ?Result Date: 08/28/2021 ?CLINICAL DATA:  Shortness of breath. EXAM: CT CHEST WITH CONTRAST TECHNIQUE: Multidetector CT imaging of the chest was performed during intravenous contrast administration. RADIATION DOSE REDUCTION: This exam was performed according to the departmental dose-optimization program which includes automated exposure control, adjustment of the mA and/or kV according to patient size and/or use of iterative reconstruction technique. CONTRAST:  70m OMNIPAQUE IOHEXOL 300 MG/ML  SOLN COMPARISON:  April 17, 2020. FINDINGS: Cardiovascular: Atherosclerosis of thoracic aorta is noted without aneurysm or dissection. Mild cardiomegaly is noted. No pericardial effusion is noted. Enlarged pulmonary artery is noted suggesting pulmonary hypertension. Mediastinum/Nodes: No enlarged mediastinal, hilar, or axillary lymph nodes. Thyroid gland, trachea, and esophagus demonstrate no significant findings. Lungs/Pleura: No pneumothorax or pleural effusion is noted. There is interval development of patchy airspace opacities throughout both lungs concerning for multifocal pneumonia. Upper Abdomen: No acute abnormality. Musculoskeletal: No chest wall abnormality. No acute or significant osseous findings. IMPRESSION: Interval development of patchy airspace opacities throughout both lungs most consistent with multifocal pneumonia. Stable enlarged pulmonary artery is noted suggesting pulmonary artery hypertension. Aortic Atherosclerosis (ICD10-I70.0). Electronically Signed   By: JMarijo ConceptionM.D.   On: 08/28/2021 15:20  ? ?CT Angio Chest  Pulmonary Embolism (PE) W or WO Contrast ? ?Addendum Date: 08/30/2021   ?ADDENDUM REPORT: 08/30/2021 15:27 ADDENDUM: Findings of pulmonary embolism were reported to Dr. WSi Raiderat approximately 3:20 p.m. on 08/30/2021. Electronically Signed   By: IKeane PoliceD.O.   On: 08/30/2021 15:27  ? ?Addendum Date: 08/30/2021   ?ADDENDUM REPORT: 08/30/2021 15:08 ADDENDUM: On further review there is filling defect in the right upper lobe pulmonary artery branches (series 4, image 28; series 7 image 49-52), consistent with pulmonary embolism. There is also enlarged pulmonary artery measuring up to 4.2 cm concerning for pulmonary arterial hypertension. Electronically Signed   By: IKeane PoliceD.O.   On: 08/30/2021 15:08  ? ?Result Date: 08/30/2021 ?CLINICAL DATA:  Dyspnea EXAM: CT ANGIOGRAPHY CHEST WITH CONTRAST TECHNIQUE: Multidetector CT imaging of the chest was performed using the standard protocol during bolus administration of intravenous contrast. Multiplanar CT image reconstructions and MIPs were obtained to evaluate the vascular anatomy. RADIATION DOSE REDUCTION: This exam was performed according to the departmental dose-optimization program which includes automated exposure control, adjustment of the mA and/or kV according to patient size and/or use of iterative reconstruction technique. CONTRAST:  756mOMNIPAQUE IOHEXOL 350 MG/ML SOLN COMPARISON:  CT chest dated August 28, 2021 FINDINGS: Cardiovascular: Satisfactory opacification of the pulmonary arteries to the segmental level. No evidence of pulmonary  embolism. Normal heart size. No pericardial effusion. Main pulmonary trunk is dilated measuring up to 4.2 cm concerning for pulmonary artery hypertension. Mediastinum/Nodes: No enlarged mediastinal, hilar, or axillary lymph nodes. Thyroid gland, trachea, and esophagus demonstrate no significant findings. Lungs/Pleura: There are multifocal bilateral patchy and ground-glass lung opacities consistent with multifocal pneumonia. No  pleural effusion or pneumothorax. Upper Abdomen: No acute abnormality. Musculoskeletal: Multilevel DA and disc disease of the thoracic spine. No acute osseous abnormality. Review of the MIP images confir

## 2021-09-01 NOTE — Plan of Care (Signed)
?  Problem: Clinical Measurements: ?Goal: Ability to maintain clinical measurements within normal limits will improve ?Outcome: Progressing ?Goal: Will remain free from infection ?Outcome: Progressing ?Goal: Diagnostic test results will improve ?Outcome: Progressing ?Goal: Respiratory complications will improve ?Outcome: Progressing ?Goal: Cardiovascular complication will be avoided ?Outcome: Progressing ?  ?Problem: Activity: ?Goal: Risk for activity intolerance will decrease ?Outcome: Progressing ?  ?Problem: Safety: ?Goal: Ability to remain free from injury will improve ?Outcome: Progressing ?  ?Problem: Skin Integrity: ?Goal: Risk for impaired skin integrity will decrease ?Outcome: Progressing ?  ?Problem: Respiratory: ?Goal: Ability to maintain adequate ventilation will improve ?Outcome: Progressing ?Goal: Ability to maintain a clear airway will improve ?Outcome: Progressing ?  ?Problem: Education: ?Goal: Knowledge of General Education information will improve ?Description: Including pain rating scale, medication(s)/side effects and non-pharmacologic comfort measures ?Outcome: Progressing ?  ?Problem: Health Behavior/Discharge Planning: ?Goal: Ability to manage health-related needs will improve ?Outcome: Progressing ?  ?Problem: Clinical Measurements: ?Goal: Ability to maintain clinical measurements within normal limits will improve ?Outcome: Progressing ?Goal: Will remain free from infection ?Outcome: Progressing ?Goal: Diagnostic test results will improve ?Outcome: Progressing ?Goal: Respiratory complications will improve ?Outcome: Progressing ?Goal: Cardiovascular complication will be avoided ?Outcome: Progressing ?  ?Problem: Activity: ?Goal: Risk for activity intolerance will decrease ?Outcome: Progressing ?  ?Problem: Nutrition: ?Goal: Adequate nutrition will be maintained ?Outcome: Progressing ?  ?Problem: Coping: ?Goal: Level of anxiety will decrease ?Outcome: Progressing ?  ?Problem: Elimination: ?Goal:  Will not experience complications related to bowel motility ?Outcome: Progressing ?Goal: Will not experience complications related to urinary retention ?Outcome: Progressing ?  ?Problem: Pain Managment: ?Goal: General experience of comfort will improve ?Outcome: Progressing ?  ?Problem: Safety: ?Goal: Ability to remain free from injury will improve ?Outcome: Progressing ?  ?Problem: Skin Integrity: ?Goal: Risk for impaired skin integrity will decrease ?Outcome: Progressing ?  ?

## 2021-09-02 ENCOUNTER — Inpatient Hospital Stay: Payer: Medicare HMO

## 2021-09-02 DIAGNOSIS — J9601 Acute respiratory failure with hypoxia: Secondary | ICD-10-CM | POA: Diagnosis present

## 2021-09-02 LAB — BASIC METABOLIC PANEL
Anion gap: 8 (ref 5–15)
BUN: 11 mg/dL (ref 8–23)
CO2: 29 mmol/L (ref 22–32)
Calcium: 8.2 mg/dL — ABNORMAL LOW (ref 8.9–10.3)
Chloride: 99 mmol/L (ref 98–111)
Creatinine, Ser: 0.59 mg/dL (ref 0.44–1.00)
GFR, Estimated: >60 mL/min (ref >60)
Glucose, Bld: 102 mg/dL — ABNORMAL HIGH (ref 70–99)
Potassium: 3.5 mmol/L (ref 3.5–5.1)
Sodium: 136 mmol/L (ref 135–145)

## 2021-09-02 LAB — CULTURE, BLOOD (ROUTINE X 2)
Culture: NO GROWTH
Culture: NO GROWTH
Special Requests: ADEQUATE

## 2021-09-02 LAB — CBC WITH DIFFERENTIAL/PLATELET
Abs Immature Granulocytes: 0.04 10*3/uL (ref 0.00–0.07)
Basophils Absolute: 0 10*3/uL (ref 0.0–0.1)
Basophils Relative: 0 %
Eosinophils Absolute: 0.3 10*3/uL (ref 0.0–0.5)
Eosinophils Relative: 3 %
HCT: 31 % — ABNORMAL LOW (ref 36.0–46.0)
Hemoglobin: 10 g/dL — ABNORMAL LOW (ref 12.0–15.0)
Immature Granulocytes: 1 %
Lymphocytes Relative: 6 %
Lymphs Abs: 0.5 10*3/uL — ABNORMAL LOW (ref 0.7–4.0)
MCH: 28.4 pg (ref 26.0–34.0)
MCHC: 32.3 g/dL (ref 30.0–36.0)
MCV: 88.1 fL (ref 80.0–100.0)
Monocytes Absolute: 0.2 10*3/uL (ref 0.1–1.0)
Monocytes Relative: 3 %
Neutro Abs: 6.4 10*3/uL (ref 1.7–7.7)
Neutrophils Relative %: 87 %
Platelets: 248 10*3/uL (ref 150–400)
RBC: 3.52 MIL/uL — ABNORMAL LOW (ref 3.87–5.11)
RDW: 13.8 % (ref 11.5–15.5)
WBC: 7.4 10*3/uL (ref 4.0–10.5)
nRBC: 0 % (ref 0.0–0.2)

## 2021-09-02 LAB — RESP PANEL BY RT-PCR (FLU A&B, COVID) ARPGX2
Influenza A by PCR: NEGATIVE
Influenza B by PCR: NEGATIVE
SARS Coronavirus 2 by RT PCR: POSITIVE — AB

## 2021-09-02 LAB — ABO/RH
ABO/RH(D): A POS
ABO/RH(D): A POS

## 2021-09-02 LAB — PROCALCITONIN: Procalcitonin: <0.1 ng/mL

## 2021-09-02 LAB — SEDIMENTATION RATE: Sed Rate: 85 mm/hr — ABNORMAL HIGH (ref 0–30)

## 2021-09-02 MED ORDER — METHYLPREDNISOLONE SODIUM SUCC 125 MG IJ SOLR
60.0000 mg | Freq: Every day | INTRAMUSCULAR | Status: DC
  Administered 2021-09-02 – 2021-09-04 (×3): 60 mg via INTRAVENOUS
  Filled 2021-09-02 (×3): qty 2

## 2021-09-02 MED ORDER — SODIUM CHLORIDE 0.9% IV SOLUTION
Freq: Once | INTRAVENOUS | Status: DC
Start: 2021-09-02 — End: 2021-09-07

## 2021-09-02 NOTE — Assessment & Plan Note (Signed)
Being managed by outpatient oncology. ?Home meds of Rituxan, Calquence are on hold since COVID infection. ?-Follow-up with outpatient oncology and they can resume meds appropriately ?

## 2021-09-02 NOTE — Assessment & Plan Note (Signed)
Repeat CT chest with worsening opacities.  Procalcitonin was negative and patient received antibiotics both outpatient and inpatient. ?Received 10 days of Augmentin prior to the hospitalization. ?Received cefepime and vancomycin followed by ceftriaxone and Zithromax while in the hospital and completed the course. ?-Recheck procalcitonin ?-Pulmonary consult ?

## 2021-09-02 NOTE — Assessment & Plan Note (Signed)
Patient with worsening hypoxia, now requiring 4 to 5 L of oxygen.  No baseline oxygen use.  History of interstitial lung disease and pulmonary hypertension.  Also has stage IV mantle cell lymphoma. ?Had recent COVID infection in the beginning of March, treated with molnupiravir. ?CTA done on 08/29/2021 with concern of right upper lobe PE and she was started on Eliquis.  Procalcitonin negative. ?Patient received antibiotics which were discontinued today. ?Repeat CT chest with worsening bilateral groundglass opacities and a new bilateral trace pleural effusion. ?-Pulmonology consult. ?

## 2021-09-02 NOTE — Progress Notes (Signed)
?   08/31/21 0733  ?Assess: MEWS Score  ?Temp 99.7 ?F (37.6 ?C)  ?BP (!) 98/57  ?Resp (!) 22  ?O2 Device Nasal Cannula  ?O2 Flow Rate (L/min) 2 L/min  ?Assess: MEWS Score  ?MEWS Temp 0  ?MEWS Systolic 1  ?MEWS Pulse 0  ?MEWS RR 1  ?MEWS LOC 0  ?MEWS Score 2  ?MEWS Score Color Yellow  ?Assess: if the MEWS score is Yellow or Red  ?Were vital signs taken at a resting state? Yes  ?Focused Assessment No change from prior assessment  ?Does the patient meet 2 or more of the SIRS criteria? No  ?Does the patient have a confirmed or suspected source of infection? Yes  ?Provider and Rapid Response Notified? Yes  ?MEWS guidelines implemented *See Row Information* Yes  ?Treat  ?MEWS Interventions Administered prn meds/treatments  ?Pain Scale 0-10  ?Pain Score 0  ?Take Vital Signs  ?Increase Vital Sign Frequency  Yellow: Q 2hr X 2 then Q 4hr X 2, if remains yellow, continue Q 4hrs  ?Escalate  ?MEWS: Escalate Yellow: discuss with charge nurse/RN and consider discussing with provider and RRT  ?Notify: Charge Nurse/RN  ?Name of Charge Nurse/RN Notified [place] RN  ?Date Charge Nurse/RN Notified 08/31/21  ?Time Charge Nurse/RN Notified [phone]  ?Document  ?Patient Outcome Other (Comment) ?(monitor)  ?Assess: SIRS CRITERIA  ?SIRS Temperature  0  ?SIRS Pulse 0  ?SIRS Respirations  1  ?SIRS WBC 0  ?SIRS Score Sum  1  ? ? ?

## 2021-09-02 NOTE — Progress Notes (Signed)
SLP Cancellation Note ? ?Patient Details ?Name: Michelle [name] ?MRN: [id number] ?DOB: 19-Jun-1940 ? ? ?Cancelled treatment:       Reason Eval/Treat Not Completed:  (chart reviewed; consulted w/ Radiology Dept. MBSS is planned for 8am in the morning.) Noted Positive COVID test.  ? ? ? ? ?[name], MS, CCC-SLP ?Speech Language Pathologist ?[facility]; [place] ?[phone] (ascom) ?[name] ?09/02/2021, 4:51 PM ?

## 2021-09-02 NOTE — Progress Notes (Signed)
PT Cancellation Note ? ?Patient Details ?Name: Michelle [name] ?MRN: [id number] ?DOB: Jul 05, 1940 ? ? ?Cancelled Treatment:    Reason Eval/Treat Not Completed: Medical issues which prohibited therapy. Patient received off O2 after coming back from CT ( per family). O2 sats at 77% on room air. She is returned to 4 liters via [place] and sats improved to 85% after several minutes. Patient not up for mobility at this time. Will re-attempt later as time allows.   ? ? ?Kham Zuckerman ?09/02/2021, 10:05 AM ?

## 2021-09-02 NOTE — Assessment & Plan Note (Signed)
Home med of Rituxan is being held due to concern of active infection. ?-Can be restarted as outpatient ?

## 2021-09-02 NOTE — Consult Note (Addendum)
NAME:  Michelle [name], MRN:  [id number], DOB:  July 21, 1940, LOS: 5 ADMISSION DATE:  08/28/2021, CONSULTATION DATE:  09/02/21 REFERRING MD:  Tilman Neat, CHIEF COMPLAINT:  dyspnea   History of Present Illness:  81yF with history of NICM with recovered EF, diastolic dysfunction, OSA on CPAP followed by Dr. Craige Cotta, [facility], RA previously on methotrexate (2017), GERD, Stage IV mantle cell lymphoma on acalabrutinib and rituximab who is admitted for acute hypoxic respiratory failure. She had covid-19 infetion 08/02/21 and was treated with molnupravir with improvement in symptoms but had residual cough. On 3/17 seen by oncology in clinic and started on course of augmentin for presumed LLL pna.   She presented to [facility] for worsening dyspnea and cough and was admitted 08/28/21 and started on vanc/cefepime then CTX/azithro. Procal undetectable 3/30. Last febrile 4/1. We are consulted for hypoxia and abnormal CT Chest. She still has productive cough and dyspnea, pleuritic CP.   Pertinent  Medical History  NICM OSA on CPAP RA on MTX in past Stage IV mantle cell lymphoma on acalabrutinib and rituximab GERD  Significant Hospital Events: Including procedures, antibiotic start and stop dates in addition to other pertinent events   Admitted 08/28/21 and started on CTX/azithro, currently day 5  Interim History / Subjective:    Objective   Blood pressure 107/73, pulse 84, temperature 98.3 F (36.8 C), temperature source Oral, resp. rate 17, height 5\' 10"  (1.778 m), weight 106.5 kg, SpO2 90 %.       No intake or output data in the 24 hours ending 09/02/21 1328 Filed Weights   08/28/21 1139 08/28/21 2011  Weight: 117.5 kg 106.5 kg    Examination: General appearance: 81 y.o., female, NAD, conversant  Eyes: anicteric sclerae; PERRL, tracking appropriately HENT: NCAT; MMM Neck: Trachea midline; no lymphadenopathy, no JVD Lungs: Diminished bl, no crackles, no wheeze, tachypneic CV: RRR, no murmur  Abdomen: Soft,  non-tender; non-distended, BS present  Extremities: No peripheral edema, warm Skin: Normal turgor and texture; no rash Psych: Appropriate affect Neuro: Alert and oriented to person and place, no focal deficit    Resolved Hospital Problem list     Assessment & Plan:   # Acute hypoxic respiratory failure: # Acute PE: # OSA on CPAP: With multifocal consolidation/ggo. Baseline CT chest 2021 with a hint of basilar/subpleural fibrosis and RLL, LLL fibrosis in posterior segments. If this is infectious considerations include typical or atypical pneumonia with slow improvement, recurrent covid-19 with active infection given prior rituximab exposure. If this is noninfectious considerations include organizing pneumonia (whether from covid, CTD, drug-induced [rituximab], or idiopathic), less likely eosinophilic pneumonia.  - on 5L O2 currently and tachypneic - I think risk of respiratory deterioration from bronchoscopy outweighs potential benefit  - if covid-19 swab positive will ask lab for cycle threshold. If <30-35 then will consider repeat course of antiviral - start solumedrol 60 mg daily (ordered), tentative plan on taper no faster than 6 weeks - swallow study ordered - she reports choking on food/liquids periodically - continue AC for PE  - CPAP at night and with naps  - PT   Best Practice (right click and "Reselect all SmartList Selections" daily)   Per primary  Labs   CBC: Recent Labs  Lab 08/28/21 1141 08/28/21 2014 08/29/21 0712 09/01/21 0442 09/02/21 0616  WBC 8.8 8.3 8.1 7.6 7.4  NEUTROABS 7.9*  --  7.0 6.5 6.4  HGB 12.6 11.6* 11.1* 10.0* 10.0*  HCT 39.4 36.1 34.7* 31.4* 31.0*  MCV 88.5 88.9  88.3 88.7 88.1  PLT 266 262 242 237 248    Basic Metabolic Panel: Recent Labs  Lab 08/28/21 1141 08/28/21 2014 08/29/21 0712 09/01/21 0442 09/02/21 0616  NA 135  --  134* 138 136  K 3.4*  --  3.8 3.5 3.5  CL 97*  --  99 101 99  CO2 28  --  27 30 29   GLUCOSE 105*  --   103* 103* 102*  BUN 9  --  10 10 11   CREATININE 0.67 0.75 0.65 0.65 0.59  CALCIUM 8.9  --  8.2* 8.4* 8.2*   GFR: Estimated Creatinine Clearance: 76.6 mL/min (by C-G formula based on SCr of 0.59 mg/dL). Recent Labs  Lab 08/28/21 1153 08/28/21 2014 08/29/21 0712 09/01/21 0442 09/02/21 0616  PROCALCITON  --   --  <0.10  --   --   WBC  --  8.3 8.1 7.6 7.4  LATICACIDVEN 1.4 1.4  --   --   --     Liver Function Tests: Recent Labs  Lab 08/28/21 1141  AST 27  ALT 18  ALKPHOS 52  BILITOT 0.7  PROT 6.9  ALBUMIN 2.8*   No results for input(s): LIPASE, AMYLASE in the last 168 hours. No results for input(s): AMMONIA in the last 168 hours.  ABG No results found for: PHART, PCO2ART, PO2ART, HCO3, TCO2, ACIDBASEDEF, O2SAT   Coagulation Profile: No results for input(s): INR, PROTIME in the last 168 hours.  Cardiac Enzymes: No results for input(s): CKTOTAL, CKMB, CKMBINDEX, TROPONINI in the last 168 hours.  HbA1C: Hgb A1c MFr Bld  Date/Time Value Ref Range Status  01/12/2014 03:55 PM 6.1 4.6 - 6.5 % Final    Comment:    Glycemic Control Guidelines for People with Diabetes:Non Diabetic:  <6%Goal of Therapy: <7%Additional Action Suggested:  >8%   10/13/2011 02:42 PM 6.0 4.6 - 6.5 % Final    Comment:    Glycemic Control Guidelines for People with Diabetes:Non Diabetic:  <6%Goal of Therapy: <7%Additional Action Suggested:  >8%     CBG: No results for input(s): GLUCAP in the last 168 hours.  Review of Systems:   12 point review of systems is negative except as in HPI  Past Medical History:  She,  has a past medical history of Arthritis, Collagen vascular disease (HCC), GERD (gastroesophageal reflux disease), Headache(784.0), HFrEF (heart failure with reduced ejection fraction) (HCC), History of methotrexate therapy, Hyperlipidemia, Lymphadenopathy of head and neck (01/2015), Lymphoma, mantle cell (HCC) (06/01/2015), Personal history of chemotherapy, and Rheumatoid arthritis  (HCC).   Surgical History:   Past Surgical History:  Procedure Laterality Date   BREAST BIOPSY Left 11/07/2020   u/s bx-hydromark #3 "coil"-path pending   CARDIAC CATHETERIZATION  09/2004   [facility]; EF 60%   CARDIAC CATHETERIZATION  08/2004   [facility]   IR FLUORO GUIDED NEEDLE PLC ASPIRATION/INJECTION LOC  06/18/2018   PERIPHERAL VASCULAR CATHETERIZATION N/A 07/04/2015   Procedure: Shelda Pal Cath Insertion;  Surgeon: Annice Needy, MD;  Location: [facility] INVASIVE CV LAB;  Service: Cardiovascular;  Laterality: N/A;   PORTA CATH REMOVAL N/A 06/23/2018   Procedure: PORTA CATH REMOVAL;  Surgeon: Annice Needy, MD;  Location: [facility] INVASIVE CV LAB;  Service: Cardiovascular;  Laterality: N/A;   RIGHT/LEFT HEART CATH AND CORONARY ANGIOGRAPHY Bilateral 09/20/2019   Procedure: RIGHT/LEFT HEART CATH AND CORONARY ANGIOGRAPHY;  Surgeon: Yvonne Kendall, MD;  Location: [facility] INVASIVE CV LAB;  Service: Cardiovascular;  Laterality: Bilateral;     Social History:   reports that  she has never smoked. She has never used smokeless tobacco. She reports that she does not drink alcohol and does not use drugs.   Family History:  Her family history includes Arthritis in her brother; Cholelithiasis in her mother; Diabetes in her mother and sister; Heart murmur in her sister; Hypertension in her sister. There is no history of Breast cancer.   Allergies No Known Allergies   Home Medications  Prior to Admission medications   Medication Sig Start Date End Date Taking? Authorizing Provider  Acalabrutinib Maleate (CALQUENCE) 100 MG TABS Take 1 tablet (100 mg) by mouth in the morning and at bedtime. 07/15/21  Yes Earna Coder, MD  amoxicillin-clavulanate (AUGMENTIN) 875-125 MG tablet Take 1 tablet by mouth 2 (two) times daily. 08/16/21  Yes Borders, Daryl Eastern, NP  carvedilol (COREG) 3.125 MG tablet TAKE 1 TABLET BY MOUTH TWICE A DAY 08/05/21  Yes End, Cristal Deer, MD  chlorpheniramine-HYDROcodone 10-8 MG/5ML Take 5 mLs by mouth at  bedtime as needed for cough. 08/16/21  Yes Borders, Daryl Eastern, NP  fluticasone (FLONASE) 50 MCG/ACT nasal spray PLACE 2 SPRAYS INTO BOTH NOSTRILS DAILY AS NEEDED FOR ALLERGIES. 07/12/21  Yes Worthy Rancher B, FNP  furosemide (LASIX) 40 MG tablet TAKE 1 TABLET BY MOUTH EVERY DAY 08/05/21  Yes End, Cristal Deer, MD  losartan (COZAAR) 25 MG tablet TAKE 1 TABLET BY MOUTH EVERY DAY 08/05/21  Yes Dunn, Ryan M, PA-C  MEGARED OMEGA-3 KRILL OIL PO Take 1 tablet by mouth daily.   Yes [provider]  meloxicam (MOBIC) 7.5 MG tablet TAKE 1 TABLET BY MOUTH EVERY DAY AS NEEDED FOR PAIN 08/05/21  Yes Earna Coder, MD  Multiple Vitamins-Minerals (CENTRUM SILVER 50+WOMEN) TABS Take 1 tablet by mouth daily.   Yes [provider]  Multiple Vitamins-Minerals (HAIR SKIN AND NAILS FORMULA PO) Take 1 tablet by mouth daily.   Yes [provider]  ondansetron (ZOFRAN) 8 MG tablet ONE PILL EVERY 8 HOURS AS NEEDED FOR NAUSEA/VOMITTING. 08/05/21  Yes Earna Coder, MD  rosuvastatin (CRESTOR) 5 MG tablet TAKE 1 TABLET (5 MG TOTAL) BY MOUTH DAILY. 08/05/21  Yes Worthy Rancher B, FNP  spironolactone (ALDACTONE) 25 MG tablet Take 1 tablet (25 mg total) by mouth daily. 07/17/21 04/13/22 Yes End, Cristal Deer, MD  acetaminophen (TYLENOL) 500 MG tablet Take 1,000 mg by mouth every 6 (six) hours as needed for mild pain.     [provider]  albuterol (VENTOLIN HFA) 108 (90 Base) MCG/ACT inhaler 2 puffs inhaled orally every 4 to 6 hours as needed, not to exceed 12 puffs in 24 hours 08/02/21   Borders, Daryl Eastern, NP  cyanocobalamin 2000 MCG tablet Take 2,500 mcg by mouth daily.  Patient not taking: Reported on 08/28/2021    [provider]  docusate sodium (COLACE) 100 MG capsule Take 100 mg by mouth 2 (two) times daily as needed for mild constipation.    [provider]     Critical care time: n/a

## 2021-09-02 NOTE — Progress Notes (Signed)
?Progress Note ? ? ?Patient: Michelle [name] EPP:[id number] DOB: Oct 11, 1940 DOA: 08/28/2021     5 ?DOS: the patient was seen and examined on 09/02/2021 ?  ?Brief hospital course: ?Taken from prior notes. ? ?Michelle [name] is a 81 y.o. 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 Patient has interim hx of covid March3 treated with molnupiravir with improvement of symptoms but noted residual cough. On March 17th patient was diagnosed with LLL pna  after having regression of recovery with  increasing cough and sob. Patient was  treated with Augmentin and completed 10 day course 2 days ago. Patient now presents with ED with worsening SOB and cough. Patient notes associated symptoms of fatigue,nausea and loose stools. She denies chest pain, palpitations,fever/chills/ or decrease appetite ?  ?Pt wis being treated with IV abx and is slow to recover, complicated by immunosuppression in setting of stage 4 mantle lymphoma. ? ?4/3: Patient with worsening shortness of breath and oxygen requirement, desaturating intermittently to high 80s on 4 L of oxygen.  No baseline oxygen use.  CT chest repeated this morning with worsening bilateral opacities and a new development of trace bilateral pleural effusions. ?Also had unchanged dilatation of main pulmonary artery, suggestive of pulmonary arterial hypertension.  CTA during current hospitalization was positive for PE and she was started on Eliquis. ?Procalcitonin remain negative.  Patient received cefepime and vancomycin followed by Zithromax and ceftriaxone.  Discontinuing all antibiotics.  Rechecking procalcitonin ?Pulmonary consult was placed. ? ? ?Assessment and Plan: ?* Acute respiratory failure with hypoxia (HCC) ?Patient with worsening hypoxia, now requiring 4 to 5 L of oxygen.  No baseline oxygen  use.  History of interstitial lung disease and pulmonary hypertension.  Also has stage IV mantle cell lymphoma. ?Had recent COVID infection in the beginning of March, treated with molnupiravir. ?CTA done on 08/29/2021 with concern of right upper lobe PE and she was started on Eliquis.  Procalcitonin negative. ?Patient received antibiotics which were discontinued today. ?Repeat CT chest with worsening bilateral groundglass opacities and a new bilateral trace pleural effusion. ?-Pulmonology consult. ? ?CAP (community acquired pneumonia) ?Repeat CT chest with worsening opacities.  Procalcitonin was negative and patient received antibiotics both outpatient and inpatient. ?Received 10 days of Augmentin prior to the hospitalization. ?Received cefepime and vancomycin followed by ceftriaxone and Zithromax while in the hospital and completed the course. ?-Recheck procalcitonin ?-Pulmonary consult ? ?Acute pulmonary embolism ([place]) ?CTA with right upper lobe PE.  Patient was started on Eliquis ?History of recent COVID infection and malignancy ?-Continue Eliquis ? ?Chronic HFrEF (heart failure with reduced ejection fraction) ([place]) ?Repeat echocardiogram done done on 08/31/2021 with normal EF and grade 1 diastolic dysfunction.  Biatrial dilatation.  Moderate tricuspid regurgitation. ?Clinically appears euvolemic. ?Repeat CT chest with new diagnosis of trace bilateral pleural effusions ?-Home dose of Lasix and spironolactone is being held due to borderline soft blood pressure. ?-Can resume home diuretics once blood pressure improves. ? ?Mantle cell lymphoma of lymph nodes of head, face, and neck ([place]) ?Being managed by outpatient oncology. ?Home meds of Rituxan, Calquence are on hold since COVID infection. ?-Follow-up with outpatient oncology and they can resume meds appropriately ? ?Rheumatoid arthritis ([place]) ?Home med of Rituxan is being held due to concern of active infection. ?-Can be restarted as outpatient ? ?Hx of sleep  apnea ?Not on CPAP at home ? ?  ? ?  Subjective: Patient continues to feel quite short of breath.  No baseline oxygen use.  Denies any chest pain.  Multiple family members which include husband, sister and brother at bedside. ? ?Physical Exam: ?Vitals:  ? 09/01/21 2027 09/02/21 0425 09/02/21 0743 09/02/21 1120  ?BP: (!) 87/53 (!) 101/58 (!) 93/57 107/73  ?Pulse: 76 80 83 84  ?Resp: '20 20 16 17  '$ ?Temp: 98.7 ?F (37.1 ?C) 99.6 ?F (37.6 ?C) 98.3 ?F (36.8 ?C) 98.3 ?F (36.8 ?C)  ?TempSrc: Oral  Oral Oral  ?SpO2: 97% 95% 96% 90%  ?Weight:      ?Height:      ? ?General.  Ill-appearing elderly lady, in no acute distress. ?Pulmonary.  Few scattered rhonchi bilaterally, normal respiratory effort. ?CV.  Regular rate and rhythm, positive murmur ?Abdomen.  Soft, nontender, nondistended, BS positive. ?CNS.  Alert and oriented .  No focal neurologic deficit. ?Extremities.  No edema, no cyanosis, pulses intact and symmetrical. ?Psychiatry.  Judgment and insight appears normal. ? ?Data Reviewed: ?Prior notes, images and labs reviewed. ? ?Family Communication: Discussed with husband, sister and brother at bedside ? ?Disposition: ?Status is: Inpatient ?Remains inpatient appropriate because: Severity of illness ? ? Planned Discharge Destination: Home with Home Health ? ?DVT prophylaxis.  Eliquis ? ?Time spent: 50 minutes ? ?This record has been created using Systems analyst. Errors have been sought and corrected,but may not always be located. Such creation errors do not reflect on the standard of care. ? ?Author: ?Lorella Nimrod, MD ?09/02/2021 1:37 PM ? ?For on call review [email or web address].  ?

## 2021-09-02 NOTE — Hospital Course (Addendum)
Taken from prior notes. ? ?Michelle [name] is a 81 y.o. 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 Patient has interim hx of covid March3 treated with molnupiravir with improvement of symptoms but noted residual cough. On March 17th patient was diagnosed with LLL pna  after having regression of recovery with  increasing cough and sob. Patient was  treated with Augmentin and completed 10 day course 2 days ago. Patient now presents with ED with worsening SOB and cough. Patient notes associated symptoms of fatigue,nausea and loose stools. She denies chest pain, palpitations,fever/chills/ or decrease appetite ?  ?Pt wis being treated with IV abx and is slow to recover, complicated by immunosuppression in setting of stage 4 mantle lymphoma. ? ?4/3: Patient with worsening shortness of breath and oxygen requirement, desaturating intermittently to high 80s on 4 L of oxygen.  No baseline oxygen use.  CT chest repeated this morning with worsening bilateral opacities and a new development of trace bilateral pleural effusions. ?Also had unchanged dilatation of main pulmonary artery, suggestive of pulmonary arterial hypertension.  CTA during current hospitalization was positive for PE and she was started on Eliquis. ?Procalcitonin remain negative.  Patient received cefepime and vancomycin followed by Zithromax and ceftriaxone.  Discontinuing all antibiotics.  Rechecking procalcitonin ?Pulmonary consult was placed. ? ?4/4: Pulmonary repeated the COVID test which came back positive, initial test was positive on March 3 whether it is a reinfection or she continues to have prolonged COVID infection as CT value is 33 on 09/02/2021.  Infectious control recommended to put her on isolation due to immunocompromise status. ?Differential diagnosis include typical  or atypical pneumonia with slow improvement, recurrent COVID-19 with active infection due to prior rituximab use or persistently positive swab. ?Noninfectious considerations including organizing pneumonia whether from Alpha, drug-induced with rituximab or idiopathic.  She was started on steroid and convalescent plasma was ordered by pulmonology.  ESR elevated at 85.  Currently saturating at 6 L of oxygen.  ANCA profile negative, Fungitell and Aspergillus are negative.  Miscellaneous labs pending ? ?4/5: Clinically seems improving, oxygen requirement stable at 5 L today.  Most likely will get convalescent plasma as ordered by pulmonology today. ?Pulmonology is recommending switching Solu-Medrol with prednisone from tomorrow and start tapering on 09/10/2018 3 x 10 mg each week.  She need a follow-up with pulmonology on discharge. ?Also got the message that patient was refusing CPAP last night.  Counseled patient and she agrees to give it a try today. ?PT is now recommending SNF placement. ? ?4/6: Patient was feeling little lethargic today.  Received 2 units of convalescent plasma yesterday. ?Converting Solu-Medrol to prednisone and also starting p.o. Lasix at 20 mg daily. ?Weaning oxygen. ? ?Patient does not want to go to any rehab and would like to go back home with home health services when ready for discharge. ? ?4/7: Patient seems improving.  Chest x-ray remained the same.  Requiring 4 to 6 L of oxygen, 4 at rest and 6 with ambulation.  Family wants to keep her 1 more day in the hospital so they can prepare her for discharge.  She will be going to her sister's home so she can provide 24/7 care. ?Home health services ordered.  PT recommendation was SNF but patient decided to go home. ? ?4/8; patient remained stable and ready for discharge.  She is being discharged home on home  oxygen at 4 L all the time, she was instructed how to wean and will follow-up closely with her pulmonologist for further  recommendations. ?She was also given nebulizer machine and DuoNeb to use at home as needed. ?She was given a long taper of steroid starting with 60 mg of prednisone, tapering 10 mg weekly. ?We decreased the dose of home Lasix to 20 mg daily for softer blood pressure and holding losartan and spironolactone.  She need to see her PCP and they can resume her home dose and antihypertensives as needed and appropriate. ? ?She will continue the rest of her home medications and follow-up with her providers. ?

## 2021-09-02 NOTE — Assessment & Plan Note (Signed)
CTA with right upper lobe PE.  Patient was started on Eliquis ?History of recent COVID infection and malignancy ?-Continue Eliquis ?

## 2021-09-02 NOTE — Progress Notes (Signed)
Patient tested positive for Covid. Called lab to get the CT value and it was 33.6. ?

## 2021-09-02 NOTE — Assessment & Plan Note (Signed)
Not on CPAP at home. 

## 2021-09-02 NOTE — Assessment & Plan Note (Signed)
Repeat echocardiogram done done on 08/31/2021 with normal EF and grade 1 diastolic dysfunction.  Biatrial dilatation.  Moderate tricuspid regurgitation. ?Clinically appears euvolemic. ?Repeat CT chest with new diagnosis of trace bilateral pleural effusions ?-Home dose of Lasix and spironolactone is being held due to borderline soft blood pressure. ?-Can resume home diuretics once blood pressure improves. ?

## 2021-09-03 ENCOUNTER — Inpatient Hospital Stay: Payer: Medicare HMO

## 2021-09-03 DIAGNOSIS — J9601 Acute respiratory failure with hypoxia: Secondary | ICD-10-CM | POA: Diagnosis not present

## 2021-09-03 LAB — ANCA PROFILE
Anti-MPO Antibodies: <0.2 units (ref 0.0–0.9)
Anti-PR3 Antibodies: <0.2 units (ref 0.0–0.9)
Atypical P-ANCA titer: <1:20 {titer}
C-ANCA: <1:20 {titer}
P-ANCA: <1:20 {titer}

## 2021-09-03 NOTE — Progress Notes (Signed)
?Progress Note ? ? ?Patient: Michelle [name] YSA:[id number] DOB: July 02, 1940 DOA: 08/28/2021     6 ?DOS: the patient was seen and examined on 09/03/2021 ?  ?Brief hospital course: ?Taken from prior notes. ? ?CHRISANDRA WIEMERS is a 81 y.o. 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 Patient has interim hx of covid March3 treated with molnupiravir with improvement of symptoms but noted residual cough. On March 17th patient was diagnosed with LLL pna  after having regression of recovery with  increasing cough and sob. Patient was  treated with Augmentin and completed 10 day course 2 days ago. Patient now presents with ED with worsening SOB and cough. Patient notes associated symptoms of fatigue,nausea and loose stools. She denies chest pain, palpitations,fever/chills/ or decrease appetite ?  ?Pt wis being treated with IV abx and is slow to recover, complicated by immunosuppression in setting of stage 4 mantle lymphoma. ? ?4/3: Patient with worsening shortness of breath and oxygen requirement, desaturating intermittently to high 80s on 4 L of oxygen.  No baseline oxygen use.  CT chest repeated this morning with worsening bilateral opacities and a new development of trace bilateral pleural effusions. ?Also had unchanged dilatation of main pulmonary artery, suggestive of pulmonary arterial hypertension.  CTA during current hospitalization was positive for PE and she was started on Eliquis. ?Procalcitonin remain negative.  Patient received cefepime and vancomycin followed by Zithromax and ceftriaxone.  Discontinuing all antibiotics.  Rechecking procalcitonin ?Pulmonary consult was placed. ? ?4/4: Pulmonary repeated the COVID test which came back positive, initial test was positive on March 3 whether it is a reinfection or she continues to have  prolonged COVID infection as CT value is 33 on 09/02/2021.  Infectious control recommended to put her on isolation due to immunocompromise status. ?Differential diagnosis include typical or atypical pneumonia with slow improvement, recurrent COVID-19 with active infection due to prior rituximab use or persistently positive swab. ?Noninfectious considerations including organizing pneumonia whether from [place], drug-induced with rituximab or idiopathic.  She was started on steroid and convalescent plasma was ordered by pulmonology.  ESR elevated at 85.  Currently saturating at 6 L of oxygen.  Anke profile, Fungitell and Aspergillus results are pending. ? ? ?Assessment and Plan: ?* Acute respiratory failure with hypoxia (HCC) ?Patient with worsening hypoxia, now requiring 6L of oxygen.  No baseline oxygen use.  History of interstitial lung disease and pulmonary hypertension.  Also has stage IV mantle cell lymphoma. ?Had recent COVID infection in the beginning of March, treated with molnupiravir. ?CTA done on 08/29/2021 with concern of right upper lobe PE and she was started on Eliquis.  Procalcitonin negative. ?Repeat COVID test remained positive with CT value of 33 which can be a prolonged infection or reactivation. ?Patient received antibiotics which were discontinued yesterday ?Repeat CT chest with worsening bilateral groundglass opacities and a new bilateral trace pleural effusion. ?-Pulmonology consult-they started her on steroid and ordered convalescent plasma due to immunocompromise status. ?-Continue with steroid ?-Continue with supplemental oxygen-wean as tolerated ?-Continue with supportive care ?-Follow-up pending labs which include Aspergillus, Fungitell and ANCA profile ? ?CAP (community acquired pneumonia) ?Repeat CT chest with worsening opacities.  Procalcitonin remained negative and patient received antibiotics both outpatient and inpatient. ?Received 10 days of Augmentin prior to the  hospitalization. ?Received cefepime and vancomycin followed by ceftriaxone and Zithromax while in the hospital and completed the course. ?  Pulmonary is on board now. ?Differential diagnosis include typical or atypical pneumonia with slow improvement, recurrent COVID-19 with active infection due to prior rituximab use or persistently positive swab. ?Noninfectious considerations including organizing pneumonia whether from [place], drug-induced with rituximab or idiopathic.  She was started on steroid and convalescent plasma was ordered by pulmonology.  ESR elevated at 85.  Currently saturating at 6 L of oxygen. ? ?Acute pulmonary embolism ([place]) ?CTA with right upper lobe PE.  Patient was started on Eliquis ?History of recent COVID infection and malignancy ?-Continue Eliquis ? ?Chronic HFrEF (heart failure with reduced ejection fraction) ([place]) ?Repeat echocardiogram done done on 08/31/2021 with normal EF and grade 1 diastolic dysfunction.  Biatrial dilatation.  Moderate tricuspid regurgitation. ?Clinically appears euvolemic. ?Repeat CT chest with new diagnosis of trace bilateral pleural effusions ?-Home dose of Lasix and spironolactone is being held due to borderline soft blood pressure. ?-Can resume home diuretics once blood pressure improves. ? ?Mantle cell lymphoma of lymph nodes of head, face, and neck ([place]) ?Being managed by outpatient oncology. ?Home meds of Rituxan, [place] are on hold since COVID infection. ?-Follow-up with outpatient oncology and they can resume meds appropriately ? ?Rheumatoid arthritis ([place]) ?Home med of Rituxan is being held due to concern of active infection. ?-Can be restarted as outpatient ? ?Hx of sleep apnea ?Not on CPAP at home ? ?  ? ?Subjective: Patient was still feeling dyspneic with minor exertion.  Currently saturating well on 6 L of oxygen. ? ?Physical Exam: ?Vitals:  ? 09/02/21 1120 09/02/21 1559 09/02/21 1938 09/03/21 0751  ?BP: 107/73 (!) 95/56 100/67 101/71  ?Pulse: 84 84 89 71   ?Resp: '17 17 20 16  ' ?Temp: 98.3 ?F (36.8 ?C) 98 ?F (36.7 ?C) 98.7 ?F (37.1 ?C) 97.8 ?F (36.6 ?C)  ?TempSrc: Oral Oral Oral   ?SpO2: 90% 95% 92% 97%  ?Weight:      ?Height:      ? ?General.  Ill-appearing elderly lady, in no acute distress. ?Pulmonary.  Few scattered bilateral rhonchi, normal respiratory effort. ?CV.  Regular rate and rhythm, no JVD, rub or murmur. ?Abdomen.  Soft, nontender, nondistended, BS positive. ?CNS.  Alert and oriented .  No focal neurologic deficit. ?Extremities.  No edema, no cyanosis, pulses intact and symmetrical. ?Psychiatry.  Judgment and insight appears normal. ? ?Data Reviewed: ?Prior notes and labs reviewed ? ?Family Communication: Discussed with sister at bedside ? ?Disposition: ?Status is: Inpatient ?Remains inpatient appropriate because: Severity of illness ? ? Planned Discharge Destination: Home with Home Health ? ?DVT prophylaxis.  Eliquis ? ?Time spent: 50 minutes ? ?This record has been created using Systems analyst. Errors have been sought and corrected,but may not always be located. Such creation errors do not reflect on the standard of care. ? ?Author: ?Lorella Nimrod, MD ?09/03/2021 4:08 PM ? ?For on call review [email or web address].  ?

## 2021-09-03 NOTE — Assessment & Plan Note (Signed)
Patient with worsening hypoxia, now requiring 6L of oxygen.  No baseline oxygen use.  History of interstitial lung disease and pulmonary hypertension.  Also has stage IV mantle cell lymphoma. ?Had recent COVID infection in the beginning of March, treated with molnupiravir. ?CTA done on 08/29/2021 with concern of right upper lobe PE and she was started on Eliquis.  Procalcitonin negative. ?Repeat COVID test remained positive with CT value of 33 which can be a prolonged infection or reactivation. ?Patient received antibiotics which were discontinued yesterday ?Repeat CT chest with worsening bilateral groundglass opacities and a new bilateral trace pleural effusion. ?-Pulmonology consult-they started her on steroid and ordered convalescent plasma due to immunocompromise status. ?-Continue with steroid ?-Continue with supplemental oxygen-wean as tolerated ?-Continue with supportive care ?-Follow-up pending labs which include Aspergillus, Fungitell and ANCA profile ?

## 2021-09-03 NOTE — Progress Notes (Signed)
Physical Therapy Treatment ?Patient Details ?Name: Michelle [name] ?MRN: [id number] ?DOB: 03/02/1941 ?Today's Date: 09/03/2021 ? ? ?History of Present Illness Michelle [name] is a 81 y.o. 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 Patient has interim hx of covid March3 treated with molnupiravir with improvement of symptoms but noted residual cough. On March 17th patient was diagnosed with LLL pna  after having regression of recovery with  increasing cough and sob. Patient was  treated with Augmentin and completed 10 day course 2 days ago. Patient now presents with ED with worsening SOB and cough. Patient notes associated symptoms of fatigue,nausea and loose stools. She denies chest pain, palpitations,fever/chills/ or decrease appetite. ? ?  ?PT Comments  ? ? Physical Therapy Treatment completed this date. Patient was received supine in bed with family present. Patient refused all EOB/OOB activity, however was agreeable to bed level therapeutic exercises. Therapeutic exercised focused on BLE strengthening, with authors hand used as a visual target for increased ROM. Patient is progressing towards her goals and would continue to benefit from skilled physical therapy in order to optimize patient's return to PLOF. Continue to recommend HHPT upon discharge from acute hospitalization.  ?   ?Recommendations for follow up therapy are one component of a multi-disciplinary discharge planning process, led by the attending physician.  Recommendations may be updated based on patient status, additional functional criteria and insurance authorization. ? ?Follow Up Recommendations ? Home health PT ?  ?  ?Assistance Recommended at Discharge Intermittent Supervision/Assistance  ?Patient can return home with the following A little help with walking and/or  transfers;A little help with bathing/dressing/bathroom;Help with stairs or ramp for entrance;Assistance with cooking/housework ?  ?Equipment Recommendations ? None recommended by PT  ?  ?Recommendations for Other Services   ? ? ?  ?Precautions / Restrictions Precautions ?Precautions: Fall ?Precaution Comments: mod fall ?Restrictions ?Weight Bearing Restrictions: No  ?  ? ?Mobility ? Bed Mobility ?Overal bed mobility: Modified Independent ?  ?  ?  ?  ?  ?  ?General bed mobility comments: able to get into supine from seated position without assist. ?  ? ?  ?  ?  ?  ?  ? ?  ?   ?Cognition Arousal/Alertness: Awake/alert ?Behavior During Therapy: [facility] for tasks assessed/performed ?Overall Cognitive Status: Within Functional Limits for tasks assessed ?  ?  ?  ?  ?  ?  ?  ?  ?  ?  ?  ?  ?  ?  ?  ?  ?  ?  ?  ? ?  ?Exercises Other Exercises ?Other Exercises: X10 SAQ bilatearlly with 3 second hold in supine with pillow propped under patient's knee, completed in supine, authors hand placed for visual target ?Other Exercises: Supine glute squeezes x10 with 3 second hold ?Other Exercises: Supine SLR x15 B with authors hand used as visual target ?Other Exercises: supine Quad sets x10 B ? ?  ?General Comments   ?  ?  ? ?Pertinent Vitals/Pain Pain Assessment ?Pain Assessment: No/denies pain ?Pain Intervention(s): Monitored during session  ? ? ?Home Living   ?  ?  ?  ?  ?  ?  ?  ?  ?  ?   ?  ?Prior Function    ?  ?  ?   ? ?PT Goals (current goals can now be found in the care plan  section) Acute Rehab PT Goals ?Patient Stated Goal: to return home, feel better ?PT Goal Formulation: With patient ?Time For Goal Achievement: 09/12/21 ?Potential to Achieve Goals: Good ?Progress towards PT goals: Progressing toward goals ? ?  ?Frequency ? ? ? Min 2X/week ? ? ? ?  ?PT Plan    ? ? ?Co-evaluation   ?  ?  ?  ?  ? ?  ?AM-PAC PT "6 Clicks" Mobility   ?Outcome Measure ? Help needed turning from your back to your side while in a flat bed without  using bedrails?: A Little ?Help needed moving from lying on your back to sitting on the side of a flat bed without using bedrails?: A Little ?Help needed moving to and from a bed to a chair (including a wheelchair)?: A Lot ?Help needed standing up from a chair using your arms (e.g., wheelchair or bedside chair)?: A Little ?Help needed to walk in hospital room?: A Lot ?Help needed climbing 3-5 steps with a railing? : A Lot ?6 Click Score: 15 ? ?  ?End of Session Equipment Utilized During Treatment: Oxygen ?Activity Tolerance: Patient limited by fatigue ?Patient left: in bed;with call bell/phone within reach;with bed alarm set;with family/visitor present ?Nurse Communication: Mobility status ?PT Visit Diagnosis: Muscle weakness (generalized) (M62.81);Difficulty in walking, not elsewhere classified (R26.2) ?  ? ? ?Time: 5993-5701 ?PT Time Calculation (min) (ACUTE ONLY): 10 min ? ?Charges:  $Therapeutic Exercise: 8-22 mins          ?          ? ?Iva Boop, PT  ?09/03/21. 12:24 PM ? ? ?

## 2021-09-03 NOTE — Progress Notes (Addendum)
Pt refuses IS and proning positioning. Education provided will continue to reinforce. Pt is agreeable to lie on side. ?

## 2021-09-03 NOTE — Progress Notes (Signed)
Pt refuses CPAP at this time. Pt aware one will be made available for her if she changes her mind. ?

## 2021-09-03 NOTE — Care Management Important Message (Signed)
Important Message ? ?Patient Details  ?Name: Michelle [name] ?MRN: [id number] ?Date of Birth: [birth year] ? ? ?Medicare Important Message Given:  Yes ? ?Patient is in a isolation room so I reviewed her Important Message from Medicare with her by phone [phone]). She is in agreement with her discharge plan but stated they were waiting for some medication to come in. I wished her well and thanked her for her time. ? ? ?Juliann Pulse A Jimi Schappert ?09/03/2021, 3:10 PM ?

## 2021-09-03 NOTE — Assessment & Plan Note (Signed)
Repeat CT chest with worsening opacities.  Procalcitonin remained negative and patient received antibiotics both outpatient and inpatient. ?Received 10 days of Augmentin prior to the hospitalization. ?Received cefepime and vancomycin followed by ceftriaxone and Zithromax while in the hospital and completed the course. ?Pulmonary is on board now. ?Differential diagnosis include typical or atypical pneumonia with slow improvement, recurrent COVID-19 with active infection due to prior rituximab use or persistently positive swab. ?Noninfectious considerations including organizing pneumonia whether from [place], drug-induced with rituximab or idiopathic.  She was started on steroid and convalescent plasma was ordered by pulmonology.  ESR elevated at 85.  Currently saturating at 6 L of oxygen. ?

## 2021-09-03 NOTE — Consult Note (Signed)
? ?  NAME:  Michelle [name], MRN:  [id number], DOB:  Apr 03, 1941, LOS: 6 ?ADMISSION DATE:  08/28/2021, CONSULTATION DATE:  09/02/21 ?REFERRING MD:  Soundra Pilon, CHIEF COMPLAINT:  dyspnea  ? ?History of Present Illness:  ?80yF with history of NICM with recovered EF, diastolic dysfunction, OSA on CPAP followed by Dr. Halford Chessman, [facility], RA previously on methotrexate (2017), GERD, Stage IV mantle cell lymphoma on acalabrutinib and rituximab who is admitted for acute hypoxic respiratory failure. She had covid-19 infetion 08/02/21 and was treated with molnupravir with improvement in symptoms but had residual cough. On 3/17 seen by oncology in clinic and started on course of augmentin for presumed LLL pna.  ? ?She presented to [facility] for worsening dyspnea and cough and was admitted 08/28/21 and started on vanc/cefepime then CTX/azithro. Procal undetectable 3/30. Last febrile 4/1. We are consulted for hypoxia and abnormal CT Chest. She still has productive cough and dyspnea, pleuritic CP.  ? ?Pertinent  Medical History  ?NICM ?OSA on CPAP ?RA on MTX in past ?Stage IV mantle cell lymphoma on acalabrutinib and rituximab ?GERD ? ?Significant Hospital Events: ?Including procedures, antibiotic start and stop dates in addition to other pertinent events   ?Admitted 08/28/21 and started on CTX/azithro, currently day 5 ? ?Interim History / Subjective:  ? ?Feeling a bit better today. Still gets short of breath after coughing fits.  ? ?Objective   ?Blood pressure 101/71, pulse 71, temperature 97.8 ?F (36.6 ?C), resp. rate 16, height '5\' 10"'$  (1.778 m), weight 106.5 kg, SpO2 97 %. ?   ?   ? ?Intake/Output Summary (Last 24 hours) at 09/03/2021 1333 ?Last data filed at 09/02/2021 1415 ?Gross per 24 hour  ?Intake --  ?Output 3 ml  ?Net -3 ml  ? ?Filed Weights  ? 08/28/21 1139 08/28/21 2011  ?Weight: 117.5 kg 106.5 kg  ? ? ?Examination: ?General appearance: 81 y.o., female, NAD, conversant  ?Eyes: anicteric sclerae; PERRL, tracking appropriately ?HENT: NCAT; MMM ?Neck:  Trachea midline; no lymphadenopathy, no JVD ?Lungs: Diminished bl, no crackles, no wheeze, tachypneic ?CV: RRR, no murmur  ?Abdomen: Soft, non-tender; non-distended, BS present  ?Extremities: No peripheral edema, warm ?Skin: Normal turgor and texture; no rash ?Psych: Appropriate affect ?Neuro: Alert and oriented to person and place, no focal deficit  ? ? ?Resolved Hospital Problem list   ? ? ?Assessment & Plan:  ? ?# Acute hypoxic respiratory failure: ?# Acute PE: ?# OSA on CPAP: ?With multifocal consolidation/ggo. Baseline CT chest 2021 with a hint of basilar/subpleural fibrosis and RLL, LLL fibrosis in posterior segments. If this is infectious considerations include typical or atypical pneumonia with slow improvement, recurrent covid-19 with active infection given prior rituximab exposure given persistently positive swab at cycle threshold of 33 on 09/02/21. If this is noninfectious considerations include organizing pneumonia (whether from covid, CTD, drug-induced [rituximab], or idiopathic), less likely eosinophilic pneumonia. She does seem to be getting better with steroid. 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 2020 Nov 12; 136(20): 2290-2295) ?- continue solumedrol 60 mg daily (ordered), tentative plan on taper no faster than 6 weeks ?- continue AC for PE  ?- CPAP at night and with naps (ordered) ?- PT ? ? ?Best Practice (right click and "Reselect all SmartList Selections" daily)  ? ?Per primary ? ?Critical care time: n/a ?  ? ? ? ?  ?

## 2021-09-03 NOTE — Evaluation (Addendum)
Clinical/Bedside Swallow Evaluation ?Patient Details  ?Name: Michelle [name] ?MRN: [id number] ?Date of Birth: 1941/04/05 ? ?Today's Date: 09/03/2021 ?Time: SLP Start Time (ACUTE ONLY): 0845 SLP Stop Time (ACUTE ONLY): 5631 ?SLP Time Calculation (min) (ACUTE ONLY): 60 min ? ?Past Medical History:  ?Past Medical History:  ?Diagnosis Date  ? Arthritis   ? Collagen vascular disease ([place])   ? GERD (gastroesophageal reflux disease)   ? Headache(784.0)   ? HFrEF (heart failure with reduced ejection fraction) ([place])   ? Nonischemic cardiomyopathy, LVEF as low as 30-35% in 08/2019  ? History of methotrexate therapy   ? Hyperlipidemia   ? hx  ? Lymphadenopathy of head and neck 01/2015  ? see on Thyroid ultrasound  ? Lymphoma, mantle cell ([place]) 06/01/2015  ? bx of lymph node in right breast/Stage IV Mantle Cell Lymphoma  ? Personal history of chemotherapy   ? Rheumatoid arthritis ([place])   ? ?Past Surgical History:  ?Past Surgical History:  ?Procedure Laterality Date  ? BREAST BIOPSY Left 11/07/2020  ? u/s bx-hydromark #3 "coil"-path pending  ? CARDIAC CATHETERIZATION  09/2004  ? [facility]; EF 60%  ? CARDIAC CATHETERIZATION  08/2004  ? [facility]  ? IR FLUORO GUIDED NEEDLE PLC ASPIRATION/INJECTION LOC  06/18/2018  ? PERIPHERAL VASCULAR CATHETERIZATION N/A 07/04/2015  ? Procedure: Porta Cath Insertion;  Surgeon: [name], MD;  Location: [place];  Service: Cardiovascular;  Laterality: N/A;  ? PORTA CATH REMOVAL N/A 06/23/2018  ? Procedure: PORTA CATH REMOVAL;  Surgeon: [name], MD;  Location: [facility];  Service: Cardiovascular;  Laterality: N/A;  ? RIGHT/LEFT HEART CATH AND CORONARY ANGIOGRAPHY Bilateral 09/20/2019  ? Procedure: RIGHT/LEFT HEART CATH AND CORONARY ANGIOGRAPHY;  Surgeon: Nelva Bush, MD;  Location: [place];  Service: Cardiovascular;  Laterality: Bilateral;  ? ?HPI:  ?Pt is a 81 y.o. 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 Patient has interim hx of covid March3 treated with molnupiravir with improvement of symptoms but noted residual cough. On March 17th patient was diagnosed with LLL pna  after having regression of recovery with  increasing cough and sob. Patient was  treated with Augmentin and completed 10 day course 2 days ago. Patient now presents with ED with worsening SOB and cough. Patient notes associated symptoms of fatigue,nausea and loose stools. She denies chest pain, palpitations,fever/chills/ or decrease appetite.  CTs of Chest: Worsening multifocal pneumonia and mild congestive heart failure  superimposed on chronic interstitial lung disease.  2. New trace bilateral pleural effusions.  3. Unchanged dilatation of the main pulmonary artery, suggestive of  pulmonary arterial hypertension.  Multifocal bilateral patchy and ground-glass lung opacities consistent with severe multifocal pneumonia.  ?  ?Assessment / Plan / Recommendation  ?Clinical Impression ? Pt was seen for BSE; MBSS order cancelled this morning. Per chart notes, pt has had no CXR/CT Imaging history for concerns of pneumonia prior to 07/2021 when she was dx'd w/ COVID pneumonia then. She stated she (and her husband) have "not been able to recover from it". Pt has no neurology history. However, she is immuno-compromised, and recent Oncology txs have been put on hold since becoming sick 08/16/2021(see chart notes). MD stated her Baseline status of lymphoma and txs put her at increased challenges in her recovery from new illness.  ? ?Pt appears to present w/ adequate oropharyngeal phase swallow  function w/ No oropharyngeal phase dysphagia noted, No neuromuscular deficits noted. Pt consumed po trials w/ No overt, clinical s/s of aspiration during po trials. Pt appears at reduced risk for aspiration following general aspiration precautions.  ? ?During  po trials, pt consumed all consistencies w/ no overt coughing, decline in vocal quality, or significant change from her Baseline in respiratory presentation during/post trials. Pt appeared easily SOB w/ any exertion d/t Baseline Pulmonary status. Encouraged/instructed pt to take small sips, slowly AND to take Rest Breaks b/t bites/sips in order to calm breathing from the exertion of oral intake -- pt followed instructions appropriately. Oral phase appeared [facility] w/ timely bolus management, mastication, and control of bolus propulsion for A-P transfer for swallowing. Oral clearing achieved w/ all trial consistencies. OM Exam appeared [facility] w/ no unilateral weakness noted. Speech Clear. Pt fed self w/ setup support.  ? ?Recommend a Regular consistency diet w/ well-Cut meats, moistened foods; Thin liquids - monitor straw use for any increased WOB. Recommend general aspiration precautions, REST BREAKS during meals to lessen any WOB/SOB d/t exertion of task. Pills WHOLE in Puree for safer, easier swallowing as pt described Larger pills causing difficulty to swallow. Education given on Pills in Puree; food consistencies and easy to eat options; general aspiration precautions. NSG to reconsult if any new needs arise. NSG agreed.  ?SLP Visit Diagnosis: Dysphagia, unspecified (R13.10) (none assessed) ?   ?Aspiration Risk ?  (reduced following general aspiration precautions)  ?  ?Diet Recommendation   Regular consistency diet w/ well-Cut meats, moistened foods; Thin liquids - monitor straw use for any increased WOB. Recommend general aspiration precautions, REST BREAKS to lessen any WOB/SOB d/t exertion.  ? ?Medication Administration: Whole meds with liquid (or whole in a puree if needed for ease of swallowing)  ?  ?Other  Recommendations Recommended Consults:  (dietician f/u as needed) ?Oral Care Recommendations: Oral care BID;Oral care before and after PO;Patient independent with oral care ?Other Recommendations:  (n/a)    ? ?Recommendations for follow up therapy are one component of a multi-disciplinary discharge planning process, led by the attending physician.  Recommendations may be updated based on patient status, additional functional criteria and insurance authorization. ? ?Follow up Recommendations No SLP follow up  ? ? ?  ?Assistance Recommended at Discharge None  ?Functional Status Assessment Patient has not had a recent decline in their functional status  ?Frequency and Duration  (n/a)  ? (n/a) ?  ?   ? ?Prognosis Prognosis for Safe Diet Advancement: Good ?Barriers to Reach Goals:  (none)  ? ?  ? ?Swallow Study   ?General Date of Onset: 08/28/21 ?HPI: Pt is a 81 y.o. 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 Patient has interim hx of covid March3 treated with molnupiravir with improvement of symptoms but noted residual cough. On March 17th patient was diagnosed with LLL pna  after having regression of recovery with  increasing cough and sob. Patient was  treated with Augmentin and completed 10 day course 2 days ago. Patient now presents with ED with worsening SOB and cough. Patient notes associated symptoms of fatigue,nausea and loose stools. She denies chest pain, palpitations,fever/chills/ or decrease appetite.  CTs of Chest: Worsening multifocal pneumonia and mild congestive heart failure  superimposed on chronic interstitial lung disease.  2. New trace bilateral pleural effusions.  3. Unchanged dilatation of the main pulmonary  artery, suggestive of  pulmonary arterial hypertension.  Multifocal bilateral patchy and ground-glass lung opacities consistent with severe multifocal pneumonia. ?Type of Study: Bedside Swallow Evaluation ?Previous Swallow Assessment: none ?Diet Prior to this Study: Regular;Thin liquids ?Temperature Spikes Noted: No  (wbc 7.4) ?Respiratory Status: Nasal cannula (6L) ?History of Recent Intubation: No ?Behavior/Cognition: Alert;Pleasant mood;Cooperative (dry cough at baseline prior to po's) ?Oral Cavity Assessment: Within

## 2021-09-04 ENCOUNTER — Telehealth: Payer: Self-pay | Admitting: Student

## 2021-09-04 DIAGNOSIS — J9601 Acute respiratory failure with hypoxia: Secondary | ICD-10-CM | POA: Diagnosis not present

## 2021-09-04 LAB — FUNGITELL, SERUM: Fungitell Result: 49 pg/mL (ref <80)

## 2021-09-04 MED ORDER — FUROSEMIDE 10 MG/ML IJ SOLN
40.0000 mg | Freq: Once | INTRAMUSCULAR | Status: AC
  Administered 2021-09-04: 40 mg via INTRAVENOUS
  Filled 2021-09-04: qty 4

## 2021-09-04 MED ORDER — FUROSEMIDE 20 MG PO TABS
20.0000 mg | ORAL_TABLET | Freq: Every day | ORAL | Status: DC
  Administered 2021-09-05 – 2021-09-07 (×3): 20 mg via ORAL
  Filled 2021-09-04 (×3): qty 1

## 2021-09-04 NOTE — Progress Notes (Signed)
Physical Therapy Treatment ?Patient Details ?Name: Michelle [name] ?MRN: [id number] ?DOB: [birth year] ?Today's Date: 09/04/2021 ? ? ?History of Present Illness Michelle [name] is a 81 y.o. 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 Patient has interim hx of covid March3 treated with molnupiravir with improvement of symptoms but noted residual cough. On March 17th patient was diagnosed with LLL pna  after having regression of recovery with  increasing cough and sob. Patient was  treated with Augmentin and completed 10 day course 2 days ago. Patient now presents with ED with worsening SOB and cough. Patient notes associated symptoms of fatigue,nausea and loose stools. She denies chest pain, palpitations,fever/chills/ or decrease appetite. ? ?  ?PT Comments  ? ? Physical Therapy session completed this date. Patient tolerated session fairly. Patient did not report pain, however facial grimacing noted with significant cough attacks throughout session. Patient is able to demonstrate bed mobility at  Supervision with increased time/effort, and use of bed rails. She continues to be able to independently anteriorly scoot to EOB to have feet firmly planted on the floor. Sit to stand from EOB x2 required Min A with RW. Patient was able to take 4-5 steps from EOB to recliner at [facility] with RW. With increased coughing, and increased fatigue with functional activity, patient did destat to76% on 5L O2 via [place]. With cueing on pursed lip breathing, patient was able to return into 80's and eventually 91 with increased time. RN notified. Patient would continue to benefit from skilled physical therapy in order to optimize patient's return to PLOF. Changing recommendation to STR vs HHPT upon discharge from acute hospitalization.  ?  ?Recommendations for follow up  therapy are one component of a multi-disciplinary discharge planning process, led by the attending physician.  Recommendations may be updated based on patient status, additional functional criteria and insurance authorization. ? ?Follow Up Recommendations ? Skilled nursing-short term rehab (<3 hours/day) ?  ?  ?Assistance Recommended at Discharge Frequent or constant Supervision/Assistance  ?Patient can return home with the following A little help with walking and/or transfers;A little help with bathing/dressing/bathroom;Help with stairs or ramp for entrance;Assistance with cooking/housework ?  ?Equipment Recommendations ? Other (comment) (TBD at next level of care)  ?  ?Recommendations for Other Services   ? ? ?  ?Precautions / Restrictions Precautions ?Precautions: Fall ?Precaution Comments: mod fall ?Restrictions ?Weight Bearing Restrictions: No  ?  ? ?Mobility ? Bed Mobility ?Overal bed mobility: Modified Independent, Needs Assistance ?Bed Mobility: Supine to Sit ?  ?  ?Supine to sit: Supervision ?  ?  ?General bed mobility comments: able to get into supine from seated position without assist. ?  ? ?Transfers ?Overall transfer level: Needs assistance ?Equipment used: Rolling walker (2 wheels) ?Transfers: Sit to/from Stand ?Sit to Stand: Min assist ?  ?  ?  ?  ?  ?  ?  ? ?Ambulation/Gait ?Ambulation/Gait assistance: Min guard ?Gait Distance (Feet): 4 Feet ?Assistive device: Rolling walker (2 wheels) ?Gait Pattern/deviations: Step-to pattern, Decreased step length - right, Decreased step length - left ?Gait velocity: decreased ?  ?  ?General Gait Details: patient able to take a few steps from recliner to bed ? ? ?Stairs ?  ?  ?  ?  ?  ? ? ?Wheelchair Mobility ?  ? ?Modified Rankin (Stroke Patients Only) ?  ? ? ?  ?Balance Overall balance assessment:  Needs assistance ?Sitting-balance support: Feet supported ?Sitting balance-Leahy Scale: Fair ?  ?  ?Standing balance support: Bilateral upper extremity supported,  During functional activity, Reliant on assistive device for balance ?Standing balance-Leahy Scale: Good ?Standing balance comment: fair standing balance ?  ?  ?  ?  ?  ?  ?  ?  ?  ?  ?  ?  ? ?  ?Cognition Arousal/Alertness: Awake/alert ?Behavior During Therapy: [facility] for tasks assessed/performed ?Overall Cognitive Status: Within Functional Limits for tasks assessed ?  ?  ?  ?  ?  ?  ?  ?  ?  ?  ?  ?  ?  ?  ?  ?  ?  ?  ?  ? ?  ?Exercises   ? ?  ?General Comments General comments (skin integrity, edema, etc.): SpO2 ranged from 75-91% on 5L O2 via [place], Destated to 75 with activity, with education on pursed lip breathing patient was able to recovery with increased time. ?  ?  ? ?Pertinent Vitals/Pain Pain Assessment ?Pain Assessment: No/denies pain ?Faces Pain Scale: Hurts a little bit ?Pain Location: Chest from sig coughing ?Pain Descriptors / Indicators: Discomfort, Sore ?Pain Intervention(s): Monitored during session  ? ? ?Home Living   ?  ?  ?  ?  ?  ?  ?  ?  ?  ?   ?  ?Prior Function    ?  ?  ?   ? ?PT Goals (current goals can now be found in the care plan section) Acute Rehab PT Goals ?Patient Stated Goal: to return home, feel better ?PT Goal Formulation: With patient ?Time For Goal Achievement: 09/12/21 ?Potential to Achieve Goals: Good ?Progress towards PT goals: Progressing toward goals ? ?  ?Frequency ? ? ? Min 2X/week ? ? ? ?  ?PT Plan    ? ? ?Co-evaluation   ?  ?  ?  ?  ? ?  ?AM-PAC PT "6 Clicks" Mobility   ?Outcome Measure ? Help needed turning from your back to your side while in a flat bed without using bedrails?: A Little ?Help needed moving from lying on your back to sitting on the side of a flat bed without using bedrails?: A Little ?Help needed moving to and from a bed to a chair (including a wheelchair)?: A Lot ?Help needed standing up from a chair using your arms (e.g., wheelchair or bedside chair)?: A Lot ?Help needed to walk in hospital room?: A Lot ?Help needed climbing 3-5 steps with a railing? :  Total ?6 Click Score: 13 ? ?  ?End of Session Equipment Utilized During Treatment: Gait belt;Oxygen ?Activity Tolerance: Patient limited by fatigue ?Patient left: in chair;with call bell/phone within reach;with chair alarm set;with family/visitor present ?Nurse Communication: Mobility status ?PT Visit Diagnosis: Muscle weakness (generalized) (M62.81);Difficulty in walking, not elsewhere classified (R26.2) ?  ? ? ?Time: 1791-5056 ?PT Time Calculation (min) (ACUTE ONLY): 27 min ? ?Charges:  $Therapeutic Activity: 23-37 mins          ?          ? ?Iva Boop, PT  ?09/04/21. 1:10 PM ? ? ?

## 2021-09-04 NOTE — Progress Notes (Signed)
Patient with order for 2 units convalescent covid plasma.  Second unit retrieved from blood bank and verified by 2 RNs  per policy.  Order confirmed and second unit of covid convalescent plasma started per policy.  Second unit unable to be released in Epic, note documentation, flow sheet and green blood tag filled out per blood bank instruction.  See flow sheet for details. Second nurse verified Joycelyn Das RN  ?

## 2021-09-04 NOTE — Telephone Encounter (Signed)
Can we set up clinic follow up with me in about a month? ? ?Thanks!! ?

## 2021-09-04 NOTE — Assessment & Plan Note (Signed)
Not on CPAP at home, it was ordered by pulmonology but patient refused to use it last night. ?Encouraged patient to start using CPAP at night, she promised to try it today ?

## 2021-09-04 NOTE — Assessment & Plan Note (Signed)
Repeat echocardiogram done done on 08/31/2021 with normal EF and grade 1 diastolic dysfunction.  Biatrial dilatation.  Moderate tricuspid regurgitation. ?Clinically appears euvolemic. ?Repeat CT chest with new diagnosis of trace bilateral pleural effusions ?-Home dose of Lasix and spironolactone is being held due to borderline soft blood pressure. ?-Resume home dose of Lasix from tomorrow as getting 1 IV dose today. ?Blood pressure seems improved. ?

## 2021-09-04 NOTE — Assessment & Plan Note (Signed)
Repeat CT chest with worsening opacities.  Procalcitonin remained negative and patient received antibiotics both outpatient and inpatient. ?Received 10 days of Augmentin prior to the hospitalization. ?Received cefepime and vancomycin followed by ceftriaxone and Zithromax while in the hospital and completed the course. ?Pulmonary is on board now. ?Differential diagnosis include typical or atypical pneumonia with slow improvement, recurrent COVID-19 with active infection due to prior rituximab use or persistently positive swab. ?Noninfectious considerations including organizing pneumonia whether from [employer], drug-induced with rituximab or idiopathic.  She was started on steroid and convalescent plasma was ordered by pulmonology.  ESR elevated at 85.  Currently saturating at 5 L of oxygen. ?Most likely will be getting convalescent plasma today. ?One-time dose of IV Lasix was ordered. ?

## 2021-09-04 NOTE — Assessment & Plan Note (Signed)
Patient seems stable with 5 L of oxygen.  No baseline oxygen use.  History of interstitial lung disease and pulmonary hypertension.  Also has stage IV mantle cell lymphoma. ?Had recent COVID infection in the beginning of March, treated with molnupiravir. ?CTA done on 08/29/2021 with concern of right upper lobe PE and she was started on Eliquis.  Procalcitonin negative. ?Repeat COVID test remained positive with CT value of 33 which can be a prolonged infection or reactivation. ?Patient received antibiotics which were discontinued yesterday ?Repeat CT chest with worsening bilateral groundglass opacities and a new bilateral trace pleural effusion. ?-Pulmonology consult-they started her on steroid and ordered convalescent plasma due to immunocompromise status. ?-Continue with steroid-Solu-Medrol will be switched with prednisone tomorrow till 09/08/2021 and from 4/10 we will start tapering 10 mg each week. ?-Continue with supplemental oxygen-wean as tolerated ?-Continue with supportive care ?-Follow-up pending labs which include Aspergillus, Fungitell and ANCA profile ?

## 2021-09-04 NOTE — Progress Notes (Addendum)
? ?NAME:  Michelle [name], MRN:  [id number], DOB:  11/08/1940, LOS: 7 ?ADMISSION DATE:  08/28/2021, CONSULTATION DATE:  09/02/21 ?REFERRING MD:  Soundra Pilon, CHIEF COMPLAINT:  dyspnea  ? ?History of Present Illness:  ?80yF with history of NICM with recovered EF, diastolic dysfunction, OSA on CPAP followed by Dr. Halford Chessman, [facility], RA previously on methotrexate (2017), GERD, Stage IV mantle cell lymphoma on acalabrutinib and rituximab who is admitted for acute hypoxic respiratory failure. She had covid-19 infetion 08/02/21 and was treated with molnupravir with improvement in symptoms but had residual cough. On 3/17 seen by oncology in clinic and started on course of augmentin for presumed LLL pna.  ? ?She presented to [facility] for worsening dyspnea and cough and was admitted 08/28/21 and started on vanc/cefepime then CTX/azithro. Procal undetectable 3/30. Last febrile 4/1. We are consulted for hypoxia and abnormal CT Chest. She still has productive cough and dyspnea, pleuritic CP.  ? ?Pertinent  Medical History  ?NICM ?OSA on CPAP ?RA on MTX in past ?Stage IV mantle cell lymphoma on acalabrutinib and rituximab ?GERD ? ?Significant Hospital Events: ?Including procedures, antibiotic start and stop dates in addition to other pertinent events   ?Admitted 08/28/21 and started on CTX/azithro ?4/3 started empiric steroid course for organizing pneumonia ? ?Interim History / Subjective:  ? ?Still reports feeling better today after starting steroids. Still gets short of breath after coughing fits and walking around but better than a few days ago.   ? ?Objective   ?Blood pressure 123/72, pulse 86, temperature 97.9 ?F (36.6 ?C), resp. rate 18, height '5\' 10"'$  (1.778 m), weight 106.5 kg, SpO2 91 %. ?   ?   ?No intake or output data in the 24 hours ending 09/04/21 1028 ? ?Filed Weights  ? 08/28/21 1139 08/28/21 2011  ?Weight: 117.5 kg 106.5 kg  ? ? ?Examination: ?General appearance: 81 y.o., female, NAD, conversant  ?Eyes: anicteric sclerae; PERRL,  tracking appropriately ?HENT: NCAT; MMM ?Neck: Trachea midline; no lymphadenopathy, no JVD ?Lungs: crackles L>R, a  little tachypneic ?CV: RRR, no murmur  ?Abdomen: Soft, non-tender; non-distended, BS present  ?Extremities: No peripheral edema, warm ?Skin: Normal turgor and texture; no rash ?Psych: Appropriate affect ?Neuro: Alert and oriented to person and place, no focal deficit  ? ?No new labs, imaging ? ? ?Resolved Hospital Problem list   ? ? ?Assessment & Plan:  ? ?# Acute hypoxic respiratory failure: ?# Acute PE: ?# OSA on CPAP: ?With multifocal consolidation/ggo. Baseline CT chest 2021 with a hint of basilar/subpleural fibrosis and RLL, LLL fibrosis in posterior segments. If this is infectious considerations include typical or atypical pneumonia with slow improvement, recurrent covid-19 with active infection given prior rituximab exposure given persistently positive swab at cycle threshold of 33 on 09/02/21. If this is noninfectious considerations include organizing pneumonia (whether from covid, CTD, drug-induced [rituximab], or idiopathic), less likely eosinophilic pneumonia. She does seem to be getting better with steroid. 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 2020 Nov 12; 136(20): 2290-2295) ?- continue solumedrol 60 mg daily (ordered), start prednisone 60 mg daily tomorrow through 4/9, then decrease steroid dose by 10 mg each week. She was counseled to reach out to our clinic by my chart or phone [phone]) if she has more dyspnea after steroid dose is decreased.  ?- would diurese today (IV lasix 40 mg once ordered) ?- walking oximetry prior to discharge ?- continue AC for PE  ?- CPAP at  night and with naps (ordered) ?- PT ?- we will visit her on Friday  ?- I have requested clinic follow up with me in a month ? ? ?Best Practice (right click and "Reselect all SmartList Selections" daily)  ? ?Per  primary ? ?Critical care time: n/a ?  ? ? ? ?  ?

## 2021-09-04 NOTE — Progress Notes (Addendum)
?Progress Note ? ? ?Patient: Michelle [name] ATF:[id number] DOB: 1941-01-31 DOA: 08/28/2021     7 ?DOS: the patient was seen and examined on 09/04/2021 ?  ?Brief hospital course: ?Taken from prior notes. ? ?Michelle [name] is a 81 y.o. 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 Patient has interim hx of covid March3 treated with molnupiravir with improvement of symptoms but noted residual cough. On March 17th patient was diagnosed with LLL pna  after having regression of recovery with  increasing cough and sob. Patient was  treated with Augmentin and completed 10 day course 2 days ago. Patient now presents with ED with worsening SOB and cough. Patient notes associated symptoms of fatigue,nausea and loose stools. She denies chest pain, palpitations,fever/chills/ or decrease appetite ?  ?Pt wis being treated with IV abx and is slow to recover, complicated by immunosuppression in setting of stage 4 mantle lymphoma. ? ?4/3: Patient with worsening shortness of breath and oxygen requirement, desaturating intermittently to high 80s on 4 L of oxygen.  No baseline oxygen use.  CT chest repeated this morning with worsening bilateral opacities and a new development of trace bilateral pleural effusions. ?Also had unchanged dilatation of main pulmonary artery, suggestive of pulmonary arterial hypertension.  CTA during current hospitalization was positive for PE and she was started on Eliquis. ?Procalcitonin remain negative.  Patient received cefepime and vancomycin followed by Zithromax and ceftriaxone.  Discontinuing all antibiotics.  Rechecking procalcitonin ?Pulmonary consult was placed. ? ?4/4: Pulmonary repeated the COVID test which came back positive, initial test was positive on March 3 whether it is a reinfection or she continues to have  prolonged COVID infection as CT value is 33 on 09/02/2021.  Infectious control recommended to put her on isolation due to immunocompromise status. ?Differential diagnosis include typical or atypical pneumonia with slow improvement, recurrent COVID-19 with active infection due to prior rituximab use or persistently positive swab. ?Noninfectious considerations including organizing pneumonia whether from [place], drug-induced with rituximab or idiopathic.  She was started on steroid and convalescent plasma was ordered by pulmonology.  ESR elevated at 85.  Currently saturating at 6 L of oxygen.  ANCA profile negative, Fungitell and Aspergillus results are pending. ? ?4/5: Clinically seems improving, oxygen requirement stable at 5 L today.  Most likely will get convalescent plasma as ordered by pulmonology today. ?Pulmonology is recommending switching Solu-Medrol with prednisone from tomorrow and start tapering on 09/10/2018 3 x 10 mg each week.  She need a follow-up with pulmonology on discharge. ?Also got the message that patient was refusing CPAP last night.  Counseled patient and she agrees to give it a try today. ?PT is not recommending SNF placement. ? ? ?Assessment and Plan: ?* Acute respiratory failure with hypoxia (HCC) ?Patient seems stable with 5 L of oxygen.  No baseline oxygen use.  History of interstitial lung disease and pulmonary hypertension.  Also has stage IV mantle cell lymphoma. ?Had recent COVID infection in the beginning of March, treated with molnupiravir. ?CTA done on 08/29/2021 with concern of right upper lobe PE and she was started on Eliquis.  Procalcitonin negative. ?Repeat COVID test remained positive with CT value of 33 which can be a prolonged infection or reactivation. ?Patient received antibiotics which were discontinued yesterday ?Repeat CT chest with worsening bilateral groundglass opacities and a new bilateral trace pleural effusion. ?-Pulmonology consult-they started her on  steroid and  ordered convalescent plasma due to immunocompromise status. ?-Continue with steroid-Solu-Medrol will be switched with prednisone tomorrow till 09/08/2021 and from 4/10 we will start tapering 10 mg each week. ?-Continue with supplemental oxygen-wean as tolerated ?-Continue with supportive care ?-Follow-up pending labs which include Aspergillus, Fungitell and ANCA profile ? ?CAP (community acquired pneumonia) ?Repeat CT chest with worsening opacities.  Procalcitonin remained negative and patient received antibiotics both outpatient and inpatient. ?Received 10 days of Augmentin prior to the hospitalization. ?Received cefepime and vancomycin followed by ceftriaxone and Zithromax while in the hospital and completed the course. ?Pulmonary is on board now. ?Differential diagnosis include typical or atypical pneumonia with slow improvement, recurrent COVID-19 with active infection due to prior rituximab use or persistently positive swab. ?Noninfectious considerations including organizing pneumonia whether from [place], drug-induced with rituximab or idiopathic.  She was started on steroid and convalescent plasma was ordered by pulmonology.  ESR elevated at 85.  Currently saturating at 5 L of oxygen. ?Most likely will be getting convalescent plasma today. ?One-time dose of IV Lasix was ordered. ? ?Acute pulmonary embolism ([place]) ?CTA with right upper lobe PE.  Patient was started on Eliquis ?History of recent COVID infection and malignancy ?-Continue Eliquis ? ?Chronic HFrEF (heart failure with reduced ejection fraction) ([place]) ?Repeat echocardiogram done done on 08/31/2021 with normal EF and grade 1 diastolic dysfunction.  Biatrial dilatation.  Moderate tricuspid regurgitation. ?Clinically appears euvolemic. ?Repeat CT chest with new diagnosis of trace bilateral pleural effusions ?-Home dose of Lasix and spironolactone is being held due to borderline soft blood pressure. ?-Resume home dose of Lasix from tomorrow as getting 1 IV  dose today. ?Blood pressure seems improved. ? ?Mantle cell lymphoma of lymph nodes of head, face, and neck ([place]) ?Being managed by outpatient oncology. ?Home meds of Rituxan, Calquence are on hold since COVID infection. ?-Follow-up with outpatient oncology and they can resume meds appropriately ? ?Rheumatoid arthritis ([place]) ?Home med of Rituxan is being held due to concern of active infection. ?-Can be restarted as outpatient ? ?Hx of sleep apnea ?Not on CPAP at home, it was ordered by pulmonology but patient refused to use it last night. ?Encouraged patient to start using CPAP at night, she promised to try it today ? ? ?  ? ?Subjective: Patient was feeling improved when seen today.  On 5 L of oxygen but dyspnea improving.  Multiple family members at bedside. ? ?Physical Exam: ?Vitals:  ? 09/04/21 0512 09/04/21 0721 09/04/21 1428 09/04/21 1443  ?BP: 99/65 123/72 107/68 120/74  ?Pulse: 73 86 84 82  ?Resp: _0 ?Temp: (!) 97.5 ?F (36.4 ?C) 97.9 ?F (36.6 ?C) 98.7 ?F (37.1 ?C) 98.6 ?F (37 ?C)  ?TempSrc: Oral  Oral   ?SpO2: 100% 91%    ?Weight:      ?Height:      ? ?General.     In no acute distress. ?Pulmonary.  Some scattered rhonchi bilaterally, normal respiratory effort. ?CV.  Regular rate and rhythm, no JVD, rub or murmur. ?Abdomen.  Soft, nontender, nondistended, BS positive. ?CNS.  Alert and oriented .  No focal neurologic deficit. ?Extremities.  No edema, no cyanosis, pulses intact and symmetrical. ?Psychiatry.  Judgment and insight appears normal. ? ?Data Reviewed: ?Prior notes, labs and images reviewed ? ?Family Communication: Discussed with multiple family members at bedside. ? ?Disposition: ?Status is: Inpatient ?Remains inpatient appropriate because: Severity of illness ? ? Planned Discharge Destination: Skilled nursing facility ? ?DVT prophylaxis.  Eliquis ? ?  Time spent: 45 minutes ? ?This record has been created using Systems analyst. Errors have been sought and corrected,but may  not always be located. Such creation errors do not reflect on the standard of care. ? ?Author: ?Lorella Nimrod, MD ?09/04/2021 4:29 PM ? ?For on call review [email or web address].  ?

## 2021-09-05 ENCOUNTER — Other Ambulatory Visit (HOSPITAL_COMMUNITY): Payer: Self-pay

## 2021-09-05 DIAGNOSIS — E876 Hypokalemia: Secondary | ICD-10-CM

## 2021-09-05 DIAGNOSIS — J9601 Acute respiratory failure with hypoxia: Secondary | ICD-10-CM | POA: Diagnosis not present

## 2021-09-05 LAB — BPAM FFP
Blood Product Expiration Date: 202304061120
Blood Product Expiration Date: 202304061120
ISSUE DATE / TIME: 202304051414
ISSUE DATE / TIME: 202304051748
Unit Type and Rh: 6200
Unit Type and Rh: 6200

## 2021-09-05 LAB — BASIC METABOLIC PANEL
Anion gap: 10 (ref 5–15)
BUN: 17 mg/dL (ref 8–23)
CO2: 32 mmol/L (ref 22–32)
Calcium: 8.6 mg/dL — ABNORMAL LOW (ref 8.9–10.3)
Chloride: 95 mmol/L — ABNORMAL LOW (ref 98–111)
Creatinine, Ser: 0.61 mg/dL (ref 0.44–1.00)
GFR, Estimated: >60 mL/min (ref >60)
Glucose, Bld: 116 mg/dL — ABNORMAL HIGH (ref 70–99)
Potassium: 3.1 mmol/L — ABNORMAL LOW (ref 3.5–5.1)
Sodium: 137 mmol/L (ref 135–145)

## 2021-09-05 LAB — PREPARE FRESH FROZEN PLASMA
Unit division: 0
Unit division: 0

## 2021-09-05 LAB — CBC
HCT: 32.7 % — ABNORMAL LOW (ref 36.0–46.0)
Hemoglobin: 10.4 g/dL — ABNORMAL LOW (ref 12.0–15.0)
MCH: 28 pg (ref 26.0–34.0)
MCHC: 31.8 g/dL (ref 30.0–36.0)
MCV: 87.9 fL (ref 80.0–100.0)
Platelets: 321 10*3/uL (ref 150–400)
RBC: 3.72 MIL/uL — ABNORMAL LOW (ref 3.87–5.11)
RDW: 13.9 % (ref 11.5–15.5)
WBC: 14.9 10*3/uL — ABNORMAL HIGH (ref 4.0–10.5)
nRBC: 0.1 % (ref 0.0–0.2)

## 2021-09-05 LAB — ASPERGILLUS ANTIGEN, BAL/SERUM: Aspergillus Ag, BAL/Serum: 0.04 Index (ref 0.00–0.49)

## 2021-09-05 MED ORDER — ENSURE ENLIVE PO LIQD
237.0000 mL | Freq: Three times a day (TID) | ORAL | Status: DC
  Administered 2021-09-05 – 2021-09-07 (×6): 237 mL via ORAL

## 2021-09-05 MED ORDER — POTASSIUM CHLORIDE CRYS ER 20 MEQ PO TBCR
40.0000 meq | EXTENDED_RELEASE_TABLET | Freq: Two times a day (BID) | ORAL | Status: AC
  Administered 2021-09-05 (×2): 40 meq via ORAL
  Filled 2021-09-05 (×2): qty 2

## 2021-09-05 MED ORDER — PREDNISONE 50 MG PO TABS
60.0000 mg | ORAL_TABLET | Freq: Every day | ORAL | Status: DC
  Administered 2021-09-06 – 2021-09-07 (×2): 60 mg via ORAL
  Filled 2021-09-05 (×2): qty 1

## 2021-09-05 MED ORDER — ADULT MULTIVITAMIN W/MINERALS CH
1.0000 | ORAL_TABLET | Freq: Every day | ORAL | Status: DC
  Administered 2021-09-05 – 2021-09-07 (×3): 1 via ORAL
  Filled 2021-09-05 (×3): qty 1

## 2021-09-05 NOTE — Assessment & Plan Note (Signed)
Repeat echocardiogram done done on 08/31/2021 with normal EF and grade 1 diastolic dysfunction.  Biatrial dilatation.  Moderate tricuspid regurgitation. ?Clinically appears euvolemic. ?Repeat CT chest with new diagnosis of trace bilateral pleural effusions ?-Home dose of Lasix and spironolactone is being held due to borderline soft blood pressure.  Received 1 dose of IV Lasix yesterday. ?-Resume home dose of Lasix. ? ?

## 2021-09-05 NOTE — Progress Notes (Signed)
Nutrition Follow-up ? ?DOCUMENTATION CODES:  ? ?Not applicable ? ?INTERVENTION:  ? ?-Continue Ensure Enlive po TID, each supplement provides 350 kcal and 20 grams of protein ?-MVI with minerals daily ? ?NUTRITION DIAGNOSIS:  ? ?Increased nutrient needs related to cancer and cancer related treatments as evidenced by estimated needs. ? ?Ongoing ? ?GOAL:  ? ?Patient will meet greater than or equal to 90% of their needs ? ?Progressing  ? ?MONITOR:  ? ?PO intake, Supplement acceptance, Labs ? ?REASON FOR ASSESSMENT:  ? ?Malnutrition Screening Tool ?  ? ?ASSESSMENT:  ? ?81 y.o. 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 ? ?4/3- COVID-19+ ?4/4- s/p BSE- recommend regular consistency diet  ? ?Reviewed I/O's: -1.2 L x 24 hours and +2.1 L since admission ? ?UOP: 1.6 L x 24 hours ? ?Spoke with pt, who reports not feeling well today ("it's one of my bad days"). She just received her breakfast tray, but does not feel like eating. Prior to today, pt with good appetite and consuming all of her meals. Pt shares that she thinks she will feel better once she gets her medicine. She comments on her progress with physical therapy. Discussed importance of good meal and supplement intake to promote healing. Pt amenable to continue with Ensure supplements.  ? ?Medications reviewed and include lasix, prednisone, and potassium chloride.  ? ?Labs reviewed: K: 3.1, CBGS: 109.   ? ?Diet Order:   ?Diet Order   ? ?       ?  Diet regular Room service appropriate? Yes; Fluid consistency: Thin  Diet effective now       ?  ? ?  ?  ? ?  ? ? ?EDUCATION NEEDS:  ? ?No education needs have been identified at this time ? ?Skin:  Skin Assessment: Reviewed RN Assessment ? ?Last BM:  09/04/21 ? ?Height:  ? ?Ht Readings from Last 1 Encounters:  ?08/28/21 '5\' 10"'$  (1.778 m)  ? ? ?Weight:   ? ?Wt Readings from Last 1 Encounters:  ?08/28/21 106.5 kg  ? ? ?Ideal Body Weight:  68.2 kg ? ?BMI:  Body mass index is 33.69 kg/m?. ? ?Estimated Nutritional Needs:  ? ?Kcal:  2000-2200 kcal/d ? ?Protein:  100-110 g/d ? ?Fluid:  2L/d ? ? ? ?Loistine Chance, RD, LDN, CDCES ?Registered Dietitian II ?Certified Diabetes Care and Education Specialist ?Please refer to [facility] for RD and/or RD on-call/weekend/after hours pager  ?

## 2021-09-05 NOTE — Progress Notes (Signed)
PT Cancellation Note ? ?Patient Details ?Name: Michelle [name] ?MRN: [id number] ?DOB: [birth year] ? ? ?Cancelled Treatment:     PT attempt. Pt refused." I just don't feel all that well. Can you come back later this afternoon?" Acute PT will continue to follow and progress as able per current POC.  ? ? ?Willette Pa ?09/05/2021, 11:28 AM ?

## 2021-09-05 NOTE — Telephone Encounter (Signed)
Appointment Information  ?Name: Nayanna, Seaborn MRN: [id number]  ?Date: 10/08/2021 Status: Sch  ?Time: 3:30 PM Length: 30  ?Visit Type: HOSPITAL FU 30 [534] Copay: $20.00  ?Provider: Maryjane Hurter, MD Department: [place]  ?Referring Provider: Burnard Hawthorne CSN: [id number]  ?Notes: hfu/lmr  ?Made On: 09/05/2021 11:23 AM By: Rosana Berger  ? ?

## 2021-09-05 NOTE — Assessment & Plan Note (Signed)
Patient seems stable with 5 L of oxygen.  No baseline oxygen use.  History of interstitial lung disease and pulmonary hypertension.  Also has stage IV mantle cell lymphoma. ?Had recent COVID infection in the beginning of March, treated with molnupiravir. ?CTA done on 08/29/2021 with concern of right upper lobe PE and she was started on Eliquis.  Procalcitonin negative. ?Repeat COVID test remained positive with CT value of 33 which can be a prolonged infection or reactivation. ?Patient received antibiotics which were discontinued yesterday ?Repeat CT chest with worsening bilateral groundglass opacities and a new bilateral trace pleural effusion. ?-Received 2 unit of convalescent plasma yesterday-ordered by pulmonology ?Solu-Medrol is being switched with prednisone. ?ANCA profile, Fungitell and Aspergillus negative. ?-Continue with  prednisone 60 mg daily till 09/08/2021 and from 4/10 we will start tapering 10 mg each week. ?-Continue with supplemental oxygen-wean as tolerated ?-Continue with supportive care ?-Follow-up pending labs  ?

## 2021-09-05 NOTE — TOC Progression Note (Signed)
Transition of Care (TOC) - Progression Note  ? ? ?Patient Details  ?Name: KAHMARI HERARD ?MRN: [id number] ?Date of Birth: 1941-04-02 ? ?Transition of Care (TOC) CM/SW Contact  ?Pete Pelt, RN ?Phone Number: ?09/05/2021, 3:15 PM ? ?Clinical Narrative:   SNF recommended by therapy.  Sister and patient declined SNF, state they would like her to return home with home health.  RNCM relayed message to care team.  TOC to follow for needs.  Patient is [facility] with [facility] ? ? ? ?Expected Discharge Plan: [place] ?Barriers to Discharge: Continued Medical Work up ? ?Expected Discharge Plan and Services ?Expected Discharge Plan: [place] ?  ?  ?  ?Living arrangements for the past 2 months: [place] ?                ?  ?  ?  ?  ?  ?HH Arranged: PT ?[place] Agency: [facility] ?Date HH Agency Contacted: 08/30/21 ?  ?Representative spoke with at [place]: Merleen Nicely ? ? ?Social Determinants of Health (SDOH) Interventions ?  ? ?Readmission Risk Interventions ? ?  08/30/2021  ?  1:35 PM  ?Readmission Risk Prevention Plan  ?Post Dischage Appt Complete  ?Medication Screening Complete  ?Transportation Screening Complete  ? ? ?

## 2021-09-05 NOTE — Progress Notes (Signed)
?Progress Note ? ? ?Patient: Michelle [name] LMB:[id number] DOB: May 04, 1941 DOA: 08/28/2021     8 ?DOS: the patient was seen and examined on 09/05/2021 ?  ?Brief hospital course: ?Taken from prior notes. ? ?Michelle [name] is a 81 y.o. 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 Patient has interim hx of covid March3 treated with molnupiravir with improvement of symptoms but noted residual cough. On March 17th patient was diagnosed with LLL pna  after having regression of recovery with  increasing cough and sob. Patient was  treated with Augmentin and completed 10 day course 2 days ago. Patient now presents with ED with worsening SOB and cough. Patient notes associated symptoms of fatigue,nausea and loose stools. She denies chest pain, palpitations,fever/chills/ or decrease appetite ?  ?Pt wis being treated with IV abx and is slow to recover, complicated by immunosuppression in setting of stage 4 mantle lymphoma. ? ?4/3: Patient with worsening shortness of breath and oxygen requirement, desaturating intermittently to high 80s on 4 L of oxygen.  No baseline oxygen use.  CT chest repeated this morning with worsening bilateral opacities and a new development of trace bilateral pleural effusions. ?Also had unchanged dilatation of main pulmonary artery, suggestive of pulmonary arterial hypertension.  CTA during current hospitalization was positive for PE and she was started on Eliquis. ?Procalcitonin remain negative.  Patient received cefepime and vancomycin followed by Zithromax and ceftriaxone.  Discontinuing all antibiotics.  Rechecking procalcitonin ?Pulmonary consult was placed. ? ?4/4: Pulmonary repeated the COVID test which came back positive, initial test was positive on March 3 whether it is a reinfection or she continues to have  prolonged COVID infection as CT value is 33 on 09/02/2021.  Infectious control recommended to put her on isolation due to immunocompromise status. ?Differential diagnosis include typical or atypical pneumonia with slow improvement, recurrent COVID-19 with active infection due to prior rituximab use or persistently positive swab. ?Noninfectious considerations including organizing pneumonia whether from [place], drug-induced with rituximab or idiopathic.  She was started on steroid and convalescent plasma was ordered by pulmonology.  ESR elevated at 85.  Currently saturating at 6 L of oxygen.  ANCA profile negative, Fungitell and Aspergillus are negative.  Miscellaneous labs pending ? ?4/5: Clinically seems improving, oxygen requirement stable at 5 L today.  Most likely will get convalescent plasma as ordered by pulmonology today. ?Pulmonology is recommending switching Solu-Medrol with prednisone from tomorrow and start tapering on 09/10/2018 3 x 10 mg each week.  She need a follow-up with pulmonology on discharge. ?Also got the message that patient was refusing CPAP last night.  Counseled patient and she agrees to give it a try today. ?PT is now recommending SNF placement. ? ?4/6: Patient was feeling little lethargic today.  Received 2 units of convalescent plasma yesterday. ?Converting Solu-Medrol to prednisone and also starting p.o. Lasix at 20 mg daily. ?Weaning oxygen. ? ?Patient does not want to go to any rehab and would like to go back home with home health services when ready for discharge. ? ? ? ?Assessment and Plan: ?* Acute respiratory failure with hypoxia (HCC) ?Patient seems stable with 5 L of oxygen.  No baseline oxygen use.  History of interstitial lung disease and pulmonary hypertension.  Also has stage IV mantle cell lymphoma. ?Had recent COVID infection in the beginning of March, treated with molnupiravir. ?CTA done on 08/29/2021 with  concern of right upper lobe PE and she was started on Eliquis.   Procalcitonin negative. ?Repeat COVID test remained positive with CT value of 33 which can be a prolonged infection or reactivation. ?Patient received antibiotics which were discontinued yesterday ?Repeat CT chest with worsening bilateral groundglass opacities and a new bilateral trace pleural effusion. ?-Received 2 unit of convalescent plasma yesterday-ordered by pulmonology ?Solu-Medrol is being switched with prednisone. ?ANCA profile, Fungitell and Aspergillus negative. ?-Continue with  prednisone 60 mg daily till 09/08/2021 and from 4/10 we will start tapering 10 mg each week. ?-Continue with supplemental oxygen-wean as tolerated ?-Continue with supportive care ?-Follow-up pending labs  ? ?CAP (community acquired pneumonia) ?Repeat CT chest with worsening opacities.  Procalcitonin remained negative and patient received antibiotics both outpatient and inpatient. ?Received 10 days of Augmentin prior to the hospitalization. ?Received cefepime and vancomycin followed by ceftriaxone and Zithromax while in the hospital and completed the course. ?Pulmonary is on board now. ?Differential diagnosis include typical or atypical pneumonia with slow improvement, recurrent COVID-19 with active infection due to prior rituximab use or persistently positive swab. ?Noninfectious considerations including organizing pneumonia whether from [place], drug-induced with rituximab or idiopathic.  She was started on steroid and convalescent plasma was ordered by pulmonology.  ESR elevated at 85.  Currently saturating at 5 L of oxygen. ?Most likely will be getting convalescent plasma today. ?One-time dose of IV Lasix was ordered. ? ?Acute pulmonary embolism ([place]) ?CTA with right upper lobe PE.  Patient was started on Eliquis ?History of recent COVID infection and malignancy ?-Continue Eliquis ? ?Chronic HFrEF (heart failure with reduced ejection fraction) ([place]) ?Repeat echocardiogram done done on 08/31/2021 with normal EF and grade 1 diastolic  dysfunction.  Biatrial dilatation.  Moderate tricuspid regurgitation. ?Clinically appears euvolemic. ?Repeat CT chest with new diagnosis of trace bilateral pleural effusions ?-Home dose of Lasix and spironolactone is being held due to borderline soft blood pressure.  Received 1 dose of IV Lasix yesterday. ?-Resume home dose of Lasix. ? ? ?Mantle cell lymphoma of lymph nodes of head, face, and neck ([place]) ?Being managed by outpatient oncology. ?Home meds of Rituxan, Calquence are on hold since COVID infection. ?-Follow-up with outpatient oncology and they can resume meds appropriately ? ?Rheumatoid arthritis ([place]) ?Home med of Rituxan is being held due to concern of active infection. ?-Can be restarted as outpatient ? ?Hx of sleep apnea ?Not on CPAP at home, it was ordered by pulmonology but patient refused to use it last night. ?Encouraged patient to start using CPAP at night, she promised to try it today ? ?Hypokalemia ?Patient received IV Lasix yesterday. ?-Replete potassium and monitor ?  ? ?Subjective: Patient was feeling little more lethargic than yesterday.  Unable to explain any symptoms, stating that she just does not feel good today.  No pain.  Sister at bedside ? ?Physical Exam: ?Vitals:  ? 09/04/21 2108 09/05/21 0420 09/05/21 0817 09/05/21 1120  ?BP: 120/72 104/63 113/68 101/67  ?Pulse: 84 86 85 98  ?Resp:  (!) _0 ?Temp:  98 ?F (36.7 ?C) 98.2 ?F (36.8 ?C) 98.4 ?F (36.9 ?C)  ?TempSrc:   Oral Oral  ?SpO2: 100% 100% 91% 100%  ?Weight:      ?Height:      ? ?General.  Well-developed elderly lady, in no acute distress. ?Pulmonary.  Lungs clear bilaterally, normal respiratory effort. ?CV.  Regular rate and rhythm, no JVD, rub or murmur. ?Abdomen.  Soft, nontender, nondistended, BS positive. ?CNS.  Alert and  oriented .  No focal neurologic deficit. ?Extremities.  No edema, no cyanosis, pulses intact and symmetrical. ?Psychiatry.  Judgment and insight appears normal. ? ?Data Reviewed: ?Prior notes and labs  reviewed. ? ?Family Communication: Discussed with sister at bedside ? ?Disposition: ?Status is: Inpatient ?Remains inpatient appropriate because: Severity of illness ? ? Planned Discharge Destination: Home with Home

## 2021-09-05 NOTE — Assessment & Plan Note (Signed)
Patient received IV Lasix yesterday. ?-Replete potassium and monitor ?

## 2021-09-06 ENCOUNTER — Inpatient Hospital Stay: Payer: Medicare HMO

## 2021-09-06 DIAGNOSIS — J9601 Acute respiratory failure with hypoxia: Secondary | ICD-10-CM | POA: Diagnosis not present

## 2021-09-06 LAB — BASIC METABOLIC PANEL
Anion gap: 8 (ref 5–15)
BUN: 16 mg/dL (ref 8–23)
CO2: 33 mmol/L — ABNORMAL HIGH (ref 22–32)
Calcium: 8.6 mg/dL — ABNORMAL LOW (ref 8.9–10.3)
Chloride: 97 mmol/L — ABNORMAL LOW (ref 98–111)
Creatinine, Ser: 0.8 mg/dL (ref 0.44–1.00)
GFR, Estimated: >60 mL/min (ref >60)
Glucose, Bld: 107 mg/dL — ABNORMAL HIGH (ref 70–99)
Potassium: 4 mmol/L (ref 3.5–5.1)
Sodium: 138 mmol/L (ref 135–145)

## 2021-09-06 NOTE — Assessment & Plan Note (Signed)
Patient seems stable with 5 L of oxygen.  No baseline oxygen use.  History of interstitial lung disease and pulmonary hypertension.  Also has stage IV mantle cell lymphoma. ?Had recent COVID infection in the beginning of March, treated with molnupiravir. ?CTA done on 08/29/2021 with concern of right upper lobe PE and she was started on Eliquis.  Procalcitonin negative. ?Repeat COVID test remained positive with CT value of 33 which can be a prolonged infection or reactivation. ?Patient received antibiotics which were discontinued yesterday ?Repeat CT chest with worsening bilateral groundglass opacities and a new bilateral trace pleural effusion. ?-Received 2 unit of convalescent plasma yesterday-ordered by pulmonology ?Solu-Medrol is being switched with prednisone. ?ANCA profile, Fungitell and Aspergillus negative. ?-Continue with  prednisone 60 mg daily till 09/08/2021 and from 4/10 we will start tapering 10 mg each week. ?-Continue with supplemental oxygen-wean as tolerated ?-Continue with supportive care ?-Follow-up pending labs  ?

## 2021-09-06 NOTE — Progress Notes (Signed)
SATURATION QUALIFICATIONS: (This note is used to comply with regulatory documentation for home oxygen) ? ?Patient Saturations on Room Air at Rest = 90% ? ?Patient Saturations on Room Air while Ambulating = 79% ? ?Patient Saturations on 6 Liters of oxygen while Ambulating = 90% ? ?Please briefly explain why patient needs home oxygen: ? ?Patient started with 3L O2 while at rest, needed increase to 6L while up out of bed. Patient with room air quickly dropped to 79% when just moving to edge of bed. ?

## 2021-09-06 NOTE — Care Management Important Message (Signed)
Important Message ? ?Patient Details  ?Name: Michelle [name] ?MRN: [id number] ?Date of Birth: [birth year] ? ? ?Medicare Important Message Given:  Yes ? ?Patient is insolation room so I called her room [phone] and she asked that I review the Important Message from Medicare with her great-niece in the room as she said she sometimes forgets to relay messages.  Her great niece said everyone was in agreement with the discharge for tomorrow. I asked if she would like a copy and she said it wouldn't be necessary. I thanked her for her time. ? ? ?Michelle [name] ?09/06/2021, 2:37 PM ?

## 2021-09-06 NOTE — TOC Progression Note (Signed)
Transition of Care (TOC) - Progression Note  ? ? ?Patient Details  ?Name: Michelle [name] ?MRN: [id number] ?Date of Birth: 1940/07/29 ? ?Transition of Care (TOC) CM/SW Contact  ?Pete Pelt, RN ?Phone Number: ?09/06/2021, 3:34 PM ? ?Clinical Narrative:   Patient is set up with [facility] home health, continues to refuse SNF.  Anticipated DC tomorrow.   ? ?Home Oxygen saturations in system, MD aware of home oxygen order.  Oxygen sent to [facility] for delivery tomorrow. ? ? ? ?Expected Discharge Plan: [place] ?Barriers to Discharge: Continued Medical Work up ? ?Expected Discharge Plan and Services ?Expected Discharge Plan: [place] ?  ?  ?  ?Living arrangements for the past 2 months: [place] ?                ?  ?  ?  ?  ?  ?HH Arranged: PT ?[place] Agency: [facility] ?Date HH Agency Contacted: 08/30/21 ?  ?Representative spoke with at [place]: Merleen Nicely ? ? ?Social Determinants of Health (SDOH) Interventions ?  ? ?Readmission Risk Interventions ? ?  08/30/2021  ?  1:35 PM  ?Readmission Risk Prevention Plan  ?Post Dischage Appt Complete  ?Medication Screening Complete  ?Transportation Screening Complete  ? ? ?

## 2021-09-06 NOTE — Progress Notes (Signed)
Physical Therapy Treatment ?Patient Details ?Name: Michelle [name] ?MRN: [id number] ?DOB: 09-27-1940 ?Today's Date: 09/06/2021 ? ? ?History of Present Illness Michelle [name] is a 81 y.o. 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 Patient has interim hx of covid March3 treated with molnupiravir with improvement of symptoms but noted residual cough. On March 17th patient was diagnosed with LLL pna  after having regression of recovery with  increasing cough and sob. Patient was  treated with Augmentin and completed 10 day course 2 days ago. Patient now presents with ED with worsening SOB and cough. Patient notes associated symptoms of fatigue,nausea and loose stools. She denies chest pain, palpitations,fever/chills/ or decrease appetite. ? ?  ?PT Comments  ? ? Initially resistant but agrees with encouragement from family and Probation officer.  Voices concern over coughing spell with activity.  In and OOB with no assist.  Stood with min guard and is able to walk to door and back with RW and min guard.  Pt on 6 lpm [place] upon arrival with sats 90's at rest.  After gait sats do decrease to 79 and with time and cues for breathing slowly return to baseline once supine in bed.  Education provided.  Reviewed HEP and need for OOB daily.  Has multiple family in room during session and chooses to return to bed after gait.  Stated she has been up daily for several hours each day. ? ?While SNF is recommended at this time, she is choosing to transition to her sisters home upon discharge.  Has RW but will need commode.  Will also need wheelchair as she is yet to demonstrate household distances at this time if she is unable to progress gait further when discharged. ? ? ?Patient suffers from Covid infection and pneumonia which impairs his/her ability to perform daily  activities like toileting, feeding, dressing, grooming, bathing in the home. A cane, walker, crutch will not resolve the patient's issue with performing activities of daily living. A lightweight wheelchair and cushion is required/recommended and will allow patient to safely perform daily activities. ?  ?Patient can safely propel the wheelchair in the home or has a caregiver who can provide assistance.  ?  ?Recommendations for follow up therapy are one component of a multi-disciplinary discharge planning process, led by the attending physician.  Recommendations may be updated based on patient status, additional functional criteria and insurance authorization. ? ?Follow Up Recommendations ? Skilled nursing-short term rehab (<3 hours/day) ?  ?  ?Assistance Recommended at Discharge Intermittent Supervision/Assistance  ?Patient can return home with the following A little help with walking and/or transfers;A little help with bathing/dressing/bathroom;Help with stairs or ramp for entrance;Assistance with cooking/housework ?  ?Equipment Recommendations ? BSC/3in1  ?  ?Recommendations for Other Services   ? ? ?  ?Precautions / Restrictions Precautions ?Precautions: Fall ?Restrictions ?Weight Bearing Restrictions: No ?Other Position/Activity Restrictions: watch sats  ?  ? ?Mobility ? Bed Mobility ?Overal bed mobility: Modified Independent, Needs Assistance ?  ?  ?  ?  ?  ?  ?General bed mobility comments: able to get in and OOB with no assist ?  ? ?Transfers ?Overall transfer level: Needs assistance ?Equipment used: Rolling walker (2 wheels) ?Transfers: Sit to/from Stand ?Sit to Stand: Min guard ?  ?  ?  ?  ?  ?  ?  ? ?Ambulation/Gait ?Ambulation/Gait assistance: Min guard ?Gait Distance (  Feet): 20 Feet ?Assistive device: Rolling walker (2 wheels) ?Gait Pattern/deviations: Step-to pattern, Decreased step length - right, Decreased step length - left ?Gait velocity: decreased ?  ?  ?General Gait Details: reliant on walker and  distance limited by fatigue and OS sats but overall does well ? ? ?Stairs ?  ?  ?  ?  ?  ? ? ?Wheelchair Mobility ?  ? ?Modified Rankin (Stroke Patients Only) ?  ? ? ?  ?Balance Overall balance assessment: Needs assistance ?Sitting-balance support: Feet supported ?Sitting balance-Leahy Scale: Good ?  ?  ?Standing balance support: Bilateral upper extremity supported, During functional activity, Reliant on assistive device for balance ?Standing balance-Leahy Scale: Good ?  ?  ?  ?  ?  ?  ?  ?  ?  ?  ?  ?  ?  ? ?  ?Cognition Arousal/Alertness: Awake/alert ?Behavior During Therapy: [facility] for tasks assessed/performed ?Overall Cognitive Status: Within Functional Limits for tasks assessed ?  ?  ?  ?  ?  ?  ?  ?  ?  ?  ?  ?  ?  ?  ?  ?  ?General Comments: needs encouragement from Probation officer and family but agrees ?  ?  ? ?  ?Exercises   ? ?  ?General Comments   ?  ?  ? ?Pertinent Vitals/Pain Pain Assessment ?Pain Assessment: No/denies pain  ? ? ?Home Living   ?  ?  ?  ?  ?  ?  ?  ?  ?  ?   ?  ?Prior Function    ?  ?  ?   ? ?PT Goals (current goals can now be found in the care plan section) Progress towards PT goals: Progressing toward goals ? ?  ?Frequency ? ? ? Min 2X/week ? ? ? ?  ?PT Plan Current plan remains appropriate  ? ? ?Co-evaluation   ?  ?  ?  ?  ? ?  ?AM-PAC PT "6 Clicks" Mobility   ?Outcome Measure ? Help needed turning from your back to your side while in a flat bed without using bedrails?: None ?Help needed moving from lying on your back to sitting on the side of a flat bed without using bedrails?: None ?Help needed moving to and from a bed to a chair (including a wheelchair)?: A Little ?Help needed standing up from a chair using your arms (e.g., wheelchair or bedside chair)?: A Little ?Help needed to walk in hospital room?: A Little ?Help needed climbing 3-5 steps with a railing? : A Lot ?6 Click Score: 19 ? ?  ?End of Session Equipment Utilized During Treatment: Gait belt;Oxygen ?Activity Tolerance: Patient  limited by fatigue;Treatment limited secondary to medical complications (Comment) ?Patient left: in bed;with call bell/phone within reach;with bed alarm set;with family/visitor present ?Nurse Communication: Mobility status ?PT Visit Diagnosis: Muscle weakness (generalized) (M62.81);Difficulty in walking, not elsewhere classified (R26.2) ?  ? ? ?Time: 0076-2263 ?PT Time Calculation (min) (ACUTE ONLY): 26 min ? ?Charges:  $Gait Training: 23-37 mins          ?          ?Chesley Noon, PTA ?09/06/21, 11:59 AM ? ?

## 2021-09-06 NOTE — Assessment & Plan Note (Signed)
Repeat CT chest with worsening opacities.  Procalcitonin remained negative and patient received antibiotics both outpatient and inpatient. ?Received 10 days of Augmentin prior to the hospitalization. ?Received cefepime and vancomycin followed by ceftriaxone and Zithromax while in the hospital and completed the course. ?Pulmonary is on board now. ?Differential diagnosis include typical or atypical pneumonia with slow improvement, recurrent COVID-19 with active infection due to prior rituximab use or persistently positive swab. ?Noninfectious considerations including organizing pneumonia whether from [place], drug-induced with rituximab or idiopathic.  She was started on steroid and convalescent plasma was ordered by pulmonology.  ESR elevated at 85.  Currently saturating at 4 L of oxygen. ?Patient received convalescent plasma on 09/04/2021 ? ?

## 2021-09-06 NOTE — Progress Notes (Signed)
? ?NAME:  Michelle [name], MRN:  [id number], DOB:  03/19/1941, LOS: 9 ?ADMISSION DATE:  08/28/2021, CONSULTATION DATE:  09/02/21 ?REFERRING MD:  Soundra Pilon, CHIEF COMPLAINT:  dyspnea  ? ?History of Present Illness:  ?81yF with history of NICM with recovered EF, diastolic dysfunction, OSA on CPAP followed by Dr. Halford Chessman, [facility], RA previously on methotrexate (2017), GERD, Stage IV mantle cell lymphoma on acalabrutinib and rituximab who is admitted for acute hypoxic respiratory failure. She had covid-19 infetion 08/02/21 and was treated with molnupravir with improvement in symptoms but had residual cough. On 3/17 seen by oncology in clinic and started on course of augmentin for presumed LLL pna.  ? ?She presented to [facility] for worsening dyspnea and cough and was admitted 08/28/21 and started on vanc/cefepime then CTX/azithro. Procal undetectable 3/30. Last febrile 4/1. We are consulted for hypoxia and abnormal CT Chest. She still has productive cough and dyspnea, pleuritic CP.  ? ?Pertinent  Medical History  ?NICM ?OSA on CPAP ?RA on MTX in past ?Stage IV mantle cell lymphoma on acalabrutinib and rituximab ?GERD ? ?Significant Hospital Events: ?Including procedures, antibiotic start and stop dates in addition to other pertinent events   ?3/29: Started on CTX/azithro ?4/3: Started empiric steroid course for organizing pneumonia ?4/5: Pt received 2 units of convalescent plasma  ?4/6: Pt stable on 3L O2 via nasal canula ? ?Interim History / Subjective:  ?Pt on 3L O2 via nasal canula states she feels better since starting steroids.  Still with productive cough, however she states this has not worsened  ? ?Objective   ?Blood pressure 110/61, pulse 84, temperature 98.4 ?F (36.9 ?C), resp. rate 20, height '5\' 10"'$  (1.778 m), weight 106.5 kg, SpO2 91 %. ?   ?   ?No intake or output data in the 24 hours ending 09/06/21 0818 ? ?Filed Weights  ? 08/28/21 1139 08/28/21 2011  ?Weight: 117.5 kg 106.5 kg  ? ? ?Examination: ?General appearance: 81  y.o., female resting in bed, NAD transitioned off CPAP and placed on 3L O2 via nasal canula ?HENT: Supple, no JVD ?Lungs: Faint crackles throughout, slightly tachypneic worse following coughing episode ?CV: S1s2, rrr, no M/R/G, 2+ radial/2+ distal pulses, no edema  ?Abdomen: Soft, non-tender; non-distended, +BS x4 ?Extremities: Normal bulk and tone ?Skin: No rashes or lesions present  ?Psych: Appropriate affect ?Neuro: Alert and oriented, follow commands  ? ?Resolved Hospital Problem list   ? ? ?Assessment & Plan:  ? ?# Acute hypoxic respiratory failure: ?# Acute PE: ?# OSA on CPAP: ?With multifocal consolidation/ggo. Baseline CT chest 2021 with a hint of basilar/subpleural fibrosis and RLL, LLL fibrosis in posterior segments. If this is infectious considerations include typical or atypical pneumonia with slow improvement, recurrent covid-19 with active infection given prior rituximab exposure given persistently positive swab at cycle threshold of 33 on 09/02/21. If this is noninfectious considerations include organizing pneumonia (whether from covid, CTD, drug-induced [rituximab], or idiopathic), less likely eosinophilic pneumonia. Serum fungitell and aspergillus negative.  She does seem to be getting better with steroid. She is s/p 2 units of convalescent plasma for protracted COVID-19 infection  ?- continue prednisone 60 mg daily tomorrow through 4/9, then decrease steroid dose by 10 mg each week. Previously counseled to reach out to [facility] Pulmonary outpatient clinic by my chart or phone [phone]) if she has more dyspnea after steroid dose is decreased.  ?- Repeat CXR pending ?- ANCA panel and KL6 results pending  ?- walking oximetry prior to discharge ?- continue [facility]  for PE  ?- CPAP at night and with naps (ordered) ?- PT ?- Follow-up with [facility] Outpatient clinic in 1 month ? ? ?Best Practice (right click and "Reselect all SmartList Selections" daily)  ? ?Per  primary ? ?Critical care time: n/a ?  ? ?PCCM team will sign off if you need further assistance please call# listed in AMION  ? ?Donell Beers, AGNP  ?Pulmonary/Critical Care ?Pager [phone] (please enter 7 digits) ?PCCM Consult Pager [phone] (please enter 7 digits)  ? ? ?  ?

## 2021-09-06 NOTE — Progress Notes (Signed)
?Progress Note ? ? ?Patient: Michelle [name] YNW:[id number] DOB: 1941-01-28 DOA: 08/28/2021     9 ?DOS: the patient was seen and examined on 09/06/2021 ?  ?Brief hospital course: ?Taken from prior notes. ? ?Michelle [name] is a 81 y.o. 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 Patient has interim hx of covid March3 treated with molnupiravir with improvement of symptoms but noted residual cough. On March 17th patient was diagnosed with LLL pna  after having regression of recovery with  increasing cough and sob. Patient was  treated with Augmentin and completed 10 day course 2 days ago. Patient now presents with ED with worsening SOB and cough. Patient notes associated symptoms of fatigue,nausea and loose stools. She denies chest pain, palpitations,fever/chills/ or decrease appetite ?  ?Pt wis being treated with IV abx and is slow to recover, complicated by immunosuppression in setting of stage 4 mantle lymphoma. ? ?4/3: Patient with worsening shortness of breath and oxygen requirement, desaturating intermittently to high 80s on 4 L of oxygen.  No baseline oxygen use.  CT chest repeated this morning with worsening bilateral opacities and a new development of trace bilateral pleural effusions. ?Also had unchanged dilatation of main pulmonary artery, suggestive of pulmonary arterial hypertension.  CTA during current hospitalization was positive for PE and she was started on Eliquis. ?Procalcitonin remain negative.  Patient received cefepime and vancomycin followed by Zithromax and ceftriaxone.  Discontinuing all antibiotics.  Rechecking procalcitonin ?Pulmonary consult was placed. ? ?4/4: Pulmonary repeated the COVID test which came back positive, initial test was positive on March 3 whether it is a reinfection or she continues to have  prolonged COVID infection as CT value is 33 on 09/02/2021.  Infectious control recommended to put her on isolation due to immunocompromise status. ?Differential diagnosis include typical or atypical pneumonia with slow improvement, recurrent COVID-19 with active infection due to prior rituximab use or persistently positive swab. ?Noninfectious considerations including organizing pneumonia whether from [place], drug-induced with rituximab or idiopathic.  She was started on steroid and convalescent plasma was ordered by pulmonology.  ESR elevated at 85.  Currently saturating at 6 L of oxygen.  ANCA profile negative, Fungitell and Aspergillus are negative.  Miscellaneous labs pending ? ?4/5: Clinically seems improving, oxygen requirement stable at 5 L today.  Most likely will get convalescent plasma as ordered by pulmonology today. ?Pulmonology is recommending switching Solu-Medrol with prednisone from tomorrow and start tapering on 09/10/2018 3 x 10 mg each week.  She need a follow-up with pulmonology on discharge. ?Also got the message that patient was refusing CPAP last night.  Counseled patient and she agrees to give it a try today. ?PT is now recommending SNF placement. ? ?4/6: Patient was feeling little lethargic today.  Received 2 units of convalescent plasma yesterday. ?Converting Solu-Medrol to prednisone and also starting p.o. Lasix at 20 mg daily. ?Weaning oxygen. ? ?Patient does not want to go to any rehab and would like to go back home with home health services when ready for discharge. ? ?4/7: Patient seems improving.  Chest x-ray remained the same.  Requiring 4 to 6 L of oxygen, 4 at rest and 6 with ambulation.  Family wants to keep her 1 more day in the hospital so they can prepare her for discharge.  She will be going to her sister's home so she can provide 24/7 care. ?  Home health services ordered.  PT recommendation was SNF but patient decided to go home. ? ? ? ? ?Assessment and Plan: ?* Acute respiratory  failure with hypoxia (HCC) ?Patient seems stable with 5 L of oxygen.  No baseline oxygen use.  History of interstitial lung disease and pulmonary hypertension.  Also has stage IV mantle cell lymphoma. ?Had recent COVID infection in the beginning of March, treated with molnupiravir. ?CTA done on 08/29/2021 with concern of right upper lobe PE and she was started on Eliquis.  Procalcitonin negative. ?Repeat COVID test remained positive with CT value of 33 which can be a prolonged infection or reactivation. ?Patient received antibiotics which were discontinued yesterday ?Repeat CT chest with worsening bilateral groundglass opacities and a new bilateral trace pleural effusion. ?-Received 2 unit of convalescent plasma yesterday-ordered by pulmonology ?Solu-Medrol is being switched with prednisone. ?ANCA profile, Fungitell and Aspergillus negative. ?-Continue with  prednisone 60 mg daily till 09/08/2021 and from 4/10 we will start tapering 10 mg each week. ?-Continue with supplemental oxygen-wean as tolerated ?-Continue with supportive care ?-Follow-up pending labs  ? ?CAP (community acquired pneumonia) ?Repeat CT chest with worsening opacities.  Procalcitonin remained negative and patient received antibiotics both outpatient and inpatient. ?Received 10 days of Augmentin prior to the hospitalization. ?Received cefepime and vancomycin followed by ceftriaxone and Zithromax while in the hospital and completed the course. ?Pulmonary is on board now. ?Differential diagnosis include typical or atypical pneumonia with slow improvement, recurrent COVID-19 with active infection due to prior rituximab use or persistently positive swab. ?Noninfectious considerations including organizing pneumonia whether from [place], drug-induced with rituximab or idiopathic.  She was started on steroid and convalescent plasma was ordered by pulmonology.  ESR elevated at 85.  Currently saturating at 4 L of oxygen. ?Patient received convalescent plasma on  09/04/2021 ? ? ?Acute pulmonary embolism ([place]) ?CTA with right upper lobe PE.  Patient was started on Eliquis ?History of recent COVID infection and malignancy ?-Continue Eliquis ? ?Chronic HFrEF (heart failure with reduced ejection fraction) ([place]) ?Repeat echocardiogram done done on 08/31/2021 with normal EF and grade 1 diastolic dysfunction.  Biatrial dilatation.  Moderate tricuspid regurgitation. ?Clinically appears euvolemic. ?Repeat CT chest with new diagnosis of trace bilateral pleural effusions ?-Home dose of Lasix and spironolactone is being held due to borderline soft blood pressure.  Received 1 dose of IV Lasix yesterday. ?-Resume home dose of Lasix. ? ? ?Mantle cell lymphoma of lymph nodes of head, face, and neck ([place]) ?Being managed by outpatient oncology. ?Home meds of Rituxan, [place] are on hold since COVID infection. ?-Follow-up with outpatient oncology and they can resume meds appropriately ? ?Rheumatoid arthritis ([place]) ?Home med of Rituxan is being held due to concern of active infection. ?-Can be restarted as outpatient ? ?Hx of sleep apnea ?Not on CPAP at home, it was ordered by pulmonology but patient refused to use it last night. ?Encouraged patient to start using CPAP at night, she promised to try it today ? ?Hypokalemia ?Patient received IV Lasix yesterday. ?-Replete potassium and monitor ? ?  ? ?Subjective: Patient was seen and examined today.  She seems improving.  Brother and a niece at bedside.  Family is requesting to discharge tomorrow so they can prepare her place.  She will be going to sister's house who can provide 24/7 care. ? ?Physical Exam: ?Vitals:  ? 09/05/21 1937 09/06/21 0608 09/06/21 0827 09/06/21 1152  ?BP: (!) 100/51 110/61 (!) 97/55   ?Pulse: 88 84 78   ?Resp: 18  20 18   ?Temp: 98.2 ?F (36.8 ?C) 98.4 ?F (36.9 ?C) 97.6 ?F (36.4 ?C)   ?TempSrc:      ?SpO2: (!) 89% 91% 96% 90%  ?Weight:      ?Height:      ? ?General.     In no acute distress. ?Pulmonary.  Lungs clear  bilaterally, normal respiratory effort. ?CV.  Regular rate and rhythm, no JVD, rub or murmur. ?Abdomen.  Soft, nontender, nondistended, BS positive. ?CNS.  Alert and oriented .  No focal neurologic deficit. ?Extremi

## 2021-09-07 DIAGNOSIS — J9601 Acute respiratory failure with hypoxia: Secondary | ICD-10-CM | POA: Diagnosis not present

## 2021-09-07 MED ORDER — FUROSEMIDE 20 MG PO TABS: 20.0000 mg | ORAL_TABLET | Freq: Every day | ORAL | 1 refills | Status: DC

## 2021-09-07 MED ORDER — APIXABAN 5 MG PO TABS: 5.0000 mg | ORAL_TABLET | Freq: Two times a day (BID) | ORAL | 3 refills | Status: DC

## 2021-09-07 MED ORDER — BENZONATATE 100 MG PO CAPS: 100.0000 mg | ORAL_CAPSULE | Freq: Two times a day (BID) | ORAL | 0 refills | Status: DC | PRN

## 2021-09-07 MED ORDER — LOSARTAN POTASSIUM 25 MG PO TABS: 25.0000 mg | ORAL_TABLET | Freq: Every day | ORAL | 1 refills | Status: DC

## 2021-09-07 MED ORDER — PREDNISONE 10 MG PO TABS: 60.0000 mg | ORAL_TABLET | Freq: Every day | ORAL | 0 refills | Status: DC

## 2021-09-07 MED ORDER — APIXABAN 5 MG PO TABS: 5.0000 mg | ORAL_TABLET | Freq: Two times a day (BID) | ORAL | 2 refills | Status: DC

## 2021-09-07 MED ORDER — ENSURE ENLIVE PO LIQD: 237.0000 mL | Freq: Three times a day (TID) | ORAL | 12 refills | Status: AC

## 2021-09-07 MED ORDER — IPRATROPIUM-ALBUTEROL 0.5-2.5 (3) MG/3ML IN SOLN: 3.0000 mL | Freq: Four times a day (QID) | RESPIRATORY_TRACT | 0 refills | Status: DC | PRN

## 2021-09-07 MED ORDER — SPIRONOLACTONE 25 MG PO TABS: 25.0000 mg | ORAL_TABLET | Freq: Every day | ORAL | 2 refills | Status: DC

## 2021-09-07 NOTE — Discharge Summary (Addendum)
?Physician Discharge Summary ?  ?Patient: Michelle [name] MRN: [id number] DOB: 07-07-1940  ?Admit date:     08/28/2021  ?Discharge date: 09/07/21  ?Discharge Physician: Lorella Nimrod  ? ?PCP: Burnard Hawthorne, FNP  ? ?Recommendations at discharge:  ?Please obtain CBC and BMP in 1 week ?Follow-up with primary care provider within a week ?Patient is being discharged on low-dose of Lasix as compared to her home dose due to softer blood pressure and instructed to hold losartan and spironolactone, PCP can determine the need as appropriate and restart those medications. ?She is also being discharged on home oxygen. ?She is also being given a long tapering dose of steroid. ?Follow-up with pulmonology in 1 to 2 weeks. ? ?Discharge Diagnoses: ?Principal Problem: ?  Acute respiratory failure with hypoxia ([place]) ?Active Problems: ?  CAP (community acquired pneumonia) ?  Acute pulmonary embolism ([place]) ?  Chronic HFrEF (heart failure with reduced ejection fraction) ([place]) ?  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  Rheumatoid arthritis ([place]) ?  Hx of sleep apnea ?  LBBB (left bundle branch block) ?  Cardiomyopathy ([place]) ?  Hypokalemia ? ?Hospital Course: ?Taken from prior notes. ? ?Michelle [name] is a 81 y.o. 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 Patient has interim hx of covid March3 treated with molnupiravir with improvement of symptoms but noted residual cough. On March 17th patient was diagnosed with LLL pna  after having regression of recovery with  increasing cough and sob. Patient was  treated with Augmentin and completed 10 day course 2 days ago. Patient now presents with ED with worsening SOB and cough. Patient notes associated symptoms of fatigue,nausea and loose stools. She denies chest pain,  palpitations,fever/chills/ or decrease appetite ?  ?Pt wis being treated with IV abx and is slow to recover, complicated by immunosuppression in setting of stage 4 mantle lymphoma. ? ?4/3: Patient with worsening shortness of breath and oxygen requirement, desaturating intermittently to high 80s on 4 L of oxygen.  No baseline oxygen use.  CT chest repeated this morning with worsening bilateral opacities and a new development of trace bilateral pleural effusions. ?Also had unchanged dilatation of main pulmonary artery, suggestive of pulmonary arterial hypertension.  CTA during current hospitalization was positive for PE and she was started on Eliquis. ?Procalcitonin remain negative.  Patient received cefepime and vancomycin followed by Zithromax and ceftriaxone.  Discontinuing all antibiotics.  Rechecking procalcitonin ?Pulmonary consult was placed. ? ?4/4: Pulmonary repeated the COVID test which came back positive, initial test was positive on March 3 whether it is a reinfection or she continues to have prolonged COVID infection as CT value is 33 on 09/02/2021.  Infectious control recommended to put her on isolation due to immunocompromise status. ?Differential diagnosis include typical or atypical pneumonia with slow improvement, recurrent COVID-19 with active infection due to prior rituximab use or persistently positive swab. ?Noninfectious considerations including organizing pneumonia whether from [place], drug-induced with rituximab or idiopathic.  She was started on steroid and convalescent plasma was ordered by pulmonology.  ESR elevated at 85.  Currently saturating at 6 L of oxygen.  ANCA profile negative, Fungitell and Aspergillus are negative.  Miscellaneous labs pending ? ?4/5: Clinically seems improving, oxygen requirement stable at 5 L today.  Most likely will get convalescent plasma as ordered by pulmonology today. ?Pulmonology is recommending  switching Solu-Medrol with prednisone from tomorrow and start  tapering on 09/10/2018 3 x 10 mg each week.  She need a follow-up with pulmonology on discharge. ?Also got the message that patient was refusing CPAP last night.  Counseled patient and she agrees to give it a try today. ?PT is now recommending SNF placement. ? ?4/6: Patient was feeling little lethargic today.  Received 2 units of convalescent plasma yesterday. ?Converting Solu-Medrol to prednisone and also starting p.o. Lasix at 20 mg daily. ?Weaning oxygen. ? ?Patient does not want to go to any rehab and would like to go back home with home health services when ready for discharge. ? ?4/7: Patient seems improving.  Chest x-ray remained the same.  Requiring 4 to 6 L of oxygen, 4 at rest and 6 with ambulation.  Family wants to keep her 1 more day in the hospital so they can prepare her for discharge.  She will be going to her sister's home so she can provide 24/7 care. ?Home health services ordered.  PT recommendation was SNF but patient decided to go home. ? ?4/8; patient remained stable and ready for discharge.  She is being discharged home on home oxygen at 4 L all the time, she was instructed how to wean and will follow-up closely with her pulmonologist for further recommendations. ?She was also given nebulizer machine and DuoNeb to use at home as needed. ?She was given a long taper of steroid starting with 60 mg of prednisone, tapering 10 mg weekly. ?We decreased the dose of home Lasix to 20 mg daily for softer blood pressure and holding losartan and spironolactone.  She need to see her PCP and they can resume her home dose and antihypertensives as needed and appropriate. ? ?She will continue the rest of her home medications and follow-up with her providers. ? ? ?Assessment and Plan: ?* Acute respiratory failure with hypoxia (HCC) ?Patient seems stable with 5 L of oxygen.  No baseline oxygen use.  History of interstitial lung disease and pulmonary hypertension.  Also has stage IV mantle cell lymphoma. ?Had recent  COVID infection in the beginning of March, treated with molnupiravir. ?CTA done on 08/29/2021 with concern of right upper lobe PE and she was started on Eliquis.  Procalcitonin negative. ?Repeat COVID test remained positive with CT value of 33 which can be a prolonged infection or reactivation. ?Patient received antibiotics which were discontinued yesterday ?Repeat CT chest with worsening bilateral groundglass opacities and a new bilateral trace pleural effusion. ?-Received 2 unit of convalescent plasma yesterday-ordered by pulmonology ?Solu-Medrol is being switched with prednisone. ?ANCA profile, Fungitell and Aspergillus negative. ?-Continue with  prednisone 60 mg daily till 09/08/2021 and from 4/10 we will start tapering 10 mg each week. ?-Continue with supplemental oxygen-wean as tolerated ?-Continue with supportive care ?-Follow-up pending labs  ? ?CAP (community acquired pneumonia) ?Repeat CT chest with worsening opacities.  Procalcitonin remained negative and patient received antibiotics both outpatient and inpatient. ?Received 10 days of Augmentin prior to the hospitalization. ?Received cefepime and vancomycin followed by ceftriaxone and Zithromax while in the hospital and completed the course. ?Pulmonary is on board now. ?Differential diagnosis include typical or atypical pneumonia with slow improvement, recurrent COVID-19 with active infection due to prior rituximab use or persistently positive swab. ?Noninfectious considerations including organizing pneumonia whether from [place], drug-induced with rituximab or idiopathic.  She was started on steroid and convalescent plasma was ordered by pulmonology.  ESR elevated at 85.  Currently saturating at 4 L of  oxygen. ?Patient received convalescent plasma on 09/04/2021 ? ? ?Acute pulmonary embolism ([place]) ?CTA with right upper lobe PE.  Patient was started on Eliquis ?History of recent COVID infection and malignancy ?-Continue Eliquis ? ?Chronic HFrEF (heart failure  with reduced ejection fraction) ([place]) ?Repeat echocardiogram done done on 08/31/2021 with normal EF and grade 1 diastolic dysfunction.  Biatrial dilatation.  Moderate tricuspid regurgitation. ?Clinically appears euvolemic

## 2021-09-07 NOTE — TOC Progression Note (Addendum)
Transition of Care (TOC) - Progression Note  ? ? ?Patient Details  ?Name: Michelle [name] ?MRN: [id number] ?Date of Birth: [birth year] ? ?Transition of Care (TOC) CM/SW Contact  ?Ilani Otterson A Ora Mcnatt, LCSW ?Phone Number: ?09/07/2021, 9:59 AM ? ?Clinical Narrative:  02 not yet at bedside. CSW followed up with [facility] with [employer] and it has been ordered and she will notifiy delivery to bring it as pt is dc today.  Pt will also have a nebulizer machine, 3n1, and shower chair delivered. RN updated.  ?[place] arranged through [facility] ? ? ?Expected Discharge Plan: [place] ?Barriers to Discharge: Continued Medical Work up ? ?Expected Discharge Plan and Services ?Expected Discharge Plan: [place] ?  ?  ?  ?Living arrangements for the past 2 months: [place] ?                ?  ?  ?  ?  ?  ?HH Arranged: PT ?[place] Agency: [facility] ?Date HH Agency Contacted: 08/30/21 ?  ?Representative spoke with at [place]: Merleen Nicely ? ? ?Social Determinants of Health (SDOH) Interventions ?  ? ?Readmission Risk Interventions ? ?  08/30/2021  ?  1:35 PM  ?Readmission Risk Prevention Plan  ?Post Dischage Appt Complete  ?Medication Screening Complete  ?Transportation Screening Complete  ? ? ?

## 2021-09-07 NOTE — Progress Notes (Signed)
Discharge instructions provided to patient. All medications, follow up appointments, and discharged instructions reviewed. IV out. Discharging home with family.  ?Era Bumpers, RN  ?

## 2021-09-09 ENCOUNTER — Other Ambulatory Visit (HOSPITAL_COMMUNITY): Payer: Self-pay

## 2021-09-09 ENCOUNTER — Telehealth: Payer: Self-pay | Admitting: Family

## 2021-09-09 ENCOUNTER — Telehealth: Payer: Self-pay

## 2021-09-09 NOTE — Telephone Encounter (Signed)
Patient has a lab appt 09/10/2021, there are no orders in. ?

## 2021-09-09 NOTE — Telephone Encounter (Signed)
Transition Care Management Unsuccessful Follow-up Telephone Call ? ?Date of discharge and from where:  09/07/21 [facility] ? ?Attempts:  1st Attempt ? ?Reason for unsuccessful TCM follow-up call:  Unable to leave message.   ? ?  ?

## 2021-09-09 NOTE — Telephone Encounter (Signed)
Kelsey From [facility] called in stating is NP Arnett be following up on future orders for this pt... Merleen Nicely stated if NP Arnett needs to contact her, the phone to contact her is [phone] ?

## 2021-09-10 ENCOUNTER — Other Ambulatory Visit: Payer: Medicare HMO

## 2021-09-10 NOTE — Telephone Encounter (Signed)
noted 

## 2021-09-10 NOTE — Telephone Encounter (Signed)
Transition Care Management Unsuccessful Follow-up Telephone Call ? ?Date of discharge and from where:  09/07/21 [facility] ? ?Attempts:  2nd Attempt ? ?Reason for unsuccessful TCM follow-up call:  Unable to reach patient. ? ?  ?

## 2021-09-11 ENCOUNTER — Other Ambulatory Visit (HOSPITAL_COMMUNITY): Payer: Self-pay

## 2021-09-11 NOTE — Telephone Encounter (Signed)
Transition Care Management Follow-up Telephone Call ?Date of discharge and from where: 09/07/21 [facility] ?How have you been since you were released from the hospital? "I tire easily with my breathing. I am purposefully not exerting too much energy, it is rough. I am staying with family and they are helping me.  I am wearing the Cpap at night. Oxygen set at 4L." Denies pain, dizziness and all other harmful symptoms. Taking all medications as directed and without issues. Medications are being managed by granddaughter.  ?Any questions or concerns? No ? ?Items Reviewed: ?Did the pt receive and understand the discharge instructions provided? Yes  ?Medications obtained and verified? Yes and Oxygen at 4L. Managed by granddaughter ? ?Any new allergies since your discharge? No  ?Dietary orders reviewed? Yes ?Do you have support at home? Yes  ? ?Home Care and Equipment/Supplies: ?Were home health services ordered? no ? ?Functional Questionnaire: (I = Independent and D = Dependent) ?ADLs: I- family assist ? ?Bathing/Dressing- I ? ?Eating- I ? ?Maintaining continence- I ? ?Transferring/Ambulation- walker ? ?Managing Meds- Granddaughter ? ?Follow up appointments reviewed: ? ?PCP Hospital f/u appt confirmed? Yes  Scheduled to see PCP 09/17/21 @ 9:30.  ?[facility] f/u appt confirmed? Number provided to Pulmonary and Cardiology. Agrees to schedule.  ?Are transportation arrangements needed? Not right now. If situation changes, will notify for assist or reschedule.  ?If their condition worsens, is the pt aware to call PCP or go to the Emergency Dept.? Yes ?Was the patient provided with contact information for the PCP's office or ED? Yes ?Was to pt encouraged to call back with questions or concerns? Yes  ?

## 2021-09-11 NOTE — Telephone Encounter (Signed)
Renal function and potassium stable and within normal limits.  Continue current medications.  Thanks. ? ?Michelle [name] ?

## 2021-09-11 NOTE — Telephone Encounter (Signed)
Pt had BMET completed 09/06/21.  ?

## 2021-09-11 NOTE — Telephone Encounter (Signed)
Attempted to call pt. No answer. Unable to leave voicemail.  ?

## 2021-09-13 LAB — MISC LABCORP TEST (SEND OUT)
LabCorp test name: 3001866
Labcorp test code: 9985

## 2021-09-13 NOTE — Telephone Encounter (Signed)
Attempted to call pt. No answer. Unable to leave voicemail.  ? ?Normal result message sent to pt's mychart.  ?

## 2021-09-16 NOTE — Telephone Encounter (Signed)
noted 

## 2021-09-16 NOTE — Telephone Encounter (Signed)
Lab appointment for 4/28 is cancelled and HFU is scheduled for 4/18 ?

## 2021-09-17 ENCOUNTER — Other Ambulatory Visit (HOSPITAL_COMMUNITY): Payer: Self-pay

## 2021-09-17 ENCOUNTER — Telehealth: Payer: Self-pay

## 2021-09-17 ENCOUNTER — Inpatient Hospital Stay: Payer: Medicare HMO | Admitting: Family

## 2021-09-17 NOTE — Telephone Encounter (Signed)
Called patient and was not able to leave a VM because VM was not set up yet ?

## 2021-09-17 NOTE — Telephone Encounter (Signed)
Patient called stating she could not make her appointment , because she gives out of breath easily. ?

## 2021-09-17 NOTE — Telephone Encounter (Signed)
Call and triage pt ?How is sob? ? ?Does she feel she needs emergent evaluation today?   ? ?What is her blood pressure and sa02 at home? ? ?Please ensure she doesn't need to be seen in ED today ? ?Otherwise, you can make f/u appt with me ? ?Im out of the office thurs/Friday ? ?

## 2021-09-18 ENCOUNTER — Telehealth: Payer: Self-pay

## 2021-09-18 NOTE — Telephone Encounter (Signed)
Called patient today and was unable to contact her due to her Vm not being set up. Was unable to leave message ?

## 2021-09-18 NOTE — Telephone Encounter (Signed)
Spoke to patient she stated that she was feeling better still gets short winded but doesn't think she needs to go to ED and she had been checking her Bp and oxygen at home and it was good per family member. ?

## 2021-09-19 ENCOUNTER — Other Ambulatory Visit (HOSPITAL_COMMUNITY): Payer: Self-pay

## 2021-09-26 NOTE — Telephone Encounter (Signed)
Letter sent 09/26/21 ?

## 2021-09-27 ENCOUNTER — Inpatient Hospital Stay: Payer: Medicare HMO | Attending: Nurse Practitioner | Admitting: Nurse Practitioner

## 2021-09-27 ENCOUNTER — Other Ambulatory Visit: Payer: Medicare HMO

## 2021-09-27 ENCOUNTER — Inpatient Hospital Stay: Payer: Medicare HMO

## 2021-09-27 VITALS — BP 101/70 | HR 78 | Resp 18 | Wt 234.1 lb

## 2021-09-27 DIAGNOSIS — Z5111 Encounter for antineoplastic chemotherapy: Secondary | ICD-10-CM

## 2021-09-27 DIAGNOSIS — C8311 Mantle cell lymphoma, lymph nodes of head, face, and neck: Secondary | ICD-10-CM | POA: Diagnosis not present

## 2021-09-27 DIAGNOSIS — Z8616 Personal history of COVID-19: Secondary | ICD-10-CM | POA: Insufficient documentation

## 2021-09-27 DIAGNOSIS — Z79899 Other long term (current) drug therapy: Secondary | ICD-10-CM | POA: Diagnosis not present

## 2021-09-27 DIAGNOSIS — M19012 Primary osteoarthritis, left shoulder: Secondary | ICD-10-CM | POA: Diagnosis not present

## 2021-09-27 DIAGNOSIS — Z86711 Personal history of pulmonary embolism: Secondary | ICD-10-CM | POA: Diagnosis not present

## 2021-09-27 DIAGNOSIS — K5900 Constipation, unspecified: Secondary | ICD-10-CM | POA: Insufficient documentation

## 2021-09-27 DIAGNOSIS — C8318 Mantle cell lymphoma, lymph nodes of multiple sites: Secondary | ICD-10-CM | POA: Insufficient documentation

## 2021-09-27 DIAGNOSIS — I272 Pulmonary hypertension, unspecified: Secondary | ICD-10-CM | POA: Diagnosis not present

## 2021-09-27 DIAGNOSIS — Z7901 Long term (current) use of anticoagulants: Secondary | ICD-10-CM | POA: Diagnosis not present

## 2021-09-27 DIAGNOSIS — I428 Other cardiomyopathies: Secondary | ICD-10-CM | POA: Diagnosis not present

## 2021-09-27 DIAGNOSIS — D72829 Elevated white blood cell count, unspecified: Secondary | ICD-10-CM | POA: Insufficient documentation

## 2021-09-27 LAB — COMPREHENSIVE METABOLIC PANEL
ALT: 26 U/L (ref 0–44)
AST: 23 U/L (ref 15–41)
Albumin: 3.1 g/dL — ABNORMAL LOW (ref 3.5–5.0)
Alkaline Phosphatase: 49 U/L (ref 38–126)
Anion gap: 7 (ref 5–15)
BUN: 14 mg/dL (ref 8–23)
CO2: 30 mmol/L (ref 22–32)
Calcium: 8.5 mg/dL — ABNORMAL LOW (ref 8.9–10.3)
Chloride: 97 mmol/L — ABNORMAL LOW (ref 98–111)
Creatinine, Ser: 0.63 mg/dL (ref 0.44–1.00)
GFR, Estimated: >60 mL/min (ref >60)
Glucose, Bld: 130 mg/dL — ABNORMAL HIGH (ref 70–99)
Potassium: 3.4 mmol/L — ABNORMAL LOW (ref 3.5–5.1)
Sodium: 134 mmol/L — ABNORMAL LOW (ref 135–145)
Total Bilirubin: 1 mg/dL (ref 0.3–1.2)
Total Protein: 6.2 g/dL — ABNORMAL LOW (ref 6.5–8.1)

## 2021-09-27 LAB — CBC WITH DIFFERENTIAL/PLATELET
Abs Immature Granulocytes: 0.13 10*3/uL — ABNORMAL HIGH (ref 0.00–0.07)
Basophils Absolute: 0 10*3/uL (ref 0.0–0.1)
Basophils Relative: 0 %
Eosinophils Absolute: 0.3 10*3/uL (ref 0.0–0.5)
Eosinophils Relative: 2 %
HCT: 38.8 % (ref 36.0–46.0)
Hemoglobin: 12.2 g/dL (ref 12.0–15.0)
Immature Granulocytes: 1 %
Lymphocytes Relative: 6 %
Lymphs Abs: 0.8 10*3/uL (ref 0.7–4.0)
MCH: 29.1 pg (ref 26.0–34.0)
MCHC: 31.4 g/dL (ref 30.0–36.0)
MCV: 92.6 fL (ref 80.0–100.0)
Monocytes Absolute: 0.5 10*3/uL (ref 0.1–1.0)
Monocytes Relative: 4 %
Neutro Abs: 10.4 10*3/uL — ABNORMAL HIGH (ref 1.7–7.7)
Neutrophils Relative %: 87 %
Platelets: 199 10*3/uL (ref 150–400)
RBC: 4.19 MIL/uL (ref 3.87–5.11)
RDW: 18.7 % — ABNORMAL HIGH (ref 11.5–15.5)
WBC: 12 10*3/uL — ABNORMAL HIGH (ref 4.0–10.5)
nRBC: 0 % (ref 0.0–0.2)

## 2021-09-27 NOTE — Progress Notes (Signed)
Patient here today for follow up and treatment consideration regarding lymphoma. Patient recently in the hospital for pneumonia, still with cough. Patient is now on oxygen at 4 liters.  ?

## 2021-09-27 NOTE — Progress Notes (Signed)
[facility] ?OFFICE PROGRESS NOTE ? ?Patient Care Team: ?Burnard Hawthorne, FNP as PCP - General (Family Medicine) ?Nelva Bush, MD as PCP - Cardiology (Cardiology) ?Deboraha Sprang, MD as PCP - Electrophysiology (Cardiology) ?Jackolyn Confer, MD (Internal Medicine) ?Cammie Sickle, MD as Consulting Physician (Internal Medicine) ? ? Cancer Staging  ?No matching staging information was found for the patient. ? ?Oncology History Overview Note  ?# JAN 2017- MANTLE CELL LYMPHOMA STAGE IV; [R Breast LN [place] Core Bx-1.2cm LN/R Ax LN-Bx]; cyclin D Pos; Mitotic rate-LOW; MIPI score [5/intermediate risk]; BMBx-Positive for involvement. Feb 9th- START Benda-Ritux with neulasta; Prolonged neutropenia; DISCONT- Benda-Ritux;  ? ?# April 13 th 2017- START R-CHOP x1; severe/prolonged neutropenia; PET- CR; BMBx-Neg; Disc R-CHOP ? ?# 26th MAY 2017- Start Rituxan q 20M Main OCT 12th 2017- PET NED.  Stop Rituxan maintenance x 2 years [aug 2019-pneumonia] ? ?# June 2022- DIAGNOSIS: thickened cortex ?of 12 mm. An additional lymph node demonstrates a thickened cortex ?of 7 mm. A third lymph node demonstrates a mildly thickened cortex ?of 4 mm ?A. LYMPH NODE, LEFT AXILLA; ULTRASOUND-GUIDED BIOPSY:  ?- CD5+ MONOCLONAL B-CELL POPULATION; COMPATIBLE WITH INVOLVEMENT BY THE  ?PATIENT'S KNOWN MANTLE CELL LYMPHOMA.  ? ?#July 2022-second week-started ibrutinib 420 mg a dayx 1 week-stop because of severe rash.  Significant clinical response noted. ? ?# AUG 1st, 2022- start acalbrutinib. 9/23-2022-rituximab weekly. ? ?# Rheumatoid Arthritis [on MXT]; March 2017-MUGA scan-51 % ?--------------------------------------------------------   ? ?DIAGNOSIS: [jan 2017 ] Mantle cell lymphoma ? ?STAGE: 4        ;GOALS: Control ? ?CURRENT/MOST RECENT THERAPY: Surveillaince  ? ?  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 Patient is on Treatment Plan : Rituximab q 4 W  ?    ? ?  ?INTERVAL HISTORY: with husband; ambulating independently.  ? ?Michelle [name] 81 y.o.  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 In the interim, she tested positive for covid 08/02/21. Received molnupiravir. Developed LLL pneumonia. Was treated with augmentin. Clinically worsened and presented to ER on 08/28/21 with worsening SOB and cough. She was admitted and discharged home on 09/07/21. She continues oxygen via [place], continues to taper prednisone. Continues to have cough and sob with exertion. Is living with her sister who is providing care. She continues acalbrutinib since discharge. Her husband is hospitalized at [facility] for covid as well. She says that he has been diagnosed with cancer in the bones during his hospitalization.  ? ?Review of Systems  ?Constitutional:  Positive for malaise/fatigue and weight loss. Negative for chills and fever.  ?HENT:  Negative for hearing loss, nosebleeds, sore throat and tinnitus.   ?Eyes:  Negative for blurred vision and double vision.  ?Respiratory:  Positive for cough and shortness of breath. Negative for hemoptysis and wheezing.   ?Cardiovascular:  Negative for chest pain, palpitations and leg swelling.  ?Gastrointestinal:  Negative for abdominal pain, blood in stool, constipation, diarrhea, melena, nausea and vomiting.  ?Genitourinary:  Negative for dysuria and urgency.  ?Musculoskeletal:  Negative for back pain, falls, joint pain and myalgias.  ?Skin:  Negative for itching and rash.  ?Neurological:  Positive for weakness. Negative for dizziness, tingling, sensory change, loss of consciousness and headaches.  ?Endo/Heme/Allergies:  Negative for environmental allergies. Does not bruise/bleed easily.  ?Psychiatric/Behavioral:  Negative for depression. The patient is not  nervous/anxious and does not have insomnia.   ? ?PAST MEDICAL HISTORY :  ?Past Medical History:   ?Diagnosis Date  ? Arthritis   ? Collagen vascular disease ([place])   ? GERD (gastroesophageal reflux disease)   ? Headache(784.0)   ? HFrEF (heart failure with reduced ejection fraction) ([place])   ? Nonischemic cardiomyopathy, LVEF as low as 30-35% in 08/2019  ? History of methotrexate therapy   ? Hyperlipidemia   ? hx  ? Lymphadenopathy of head and neck 01/2015  ? see on Thyroid ultrasound  ? Lymphoma, mantle cell ([place]) 06/01/2015  ? bx of lymph node in right breast/Stage IV Mantle Cell Lymphoma  ? Personal history of chemotherapy   ? Rheumatoid arthritis ([place])   ? ? ?PAST SURGICAL HISTORY :   ?Past Surgical History:  ?Procedure Laterality Date  ? BREAST BIOPSY Left 11/07/2020  ? u/s bx-hydromark #3 "coil"-path pending  ? CARDIAC CATHETERIZATION  09/2004  ? [facility]; EF 60%  ? CARDIAC CATHETERIZATION  08/2004  ? [facility]  ? IR FLUORO GUIDED NEEDLE PLC ASPIRATION/INJECTION LOC  06/18/2018  ? PERIPHERAL VASCULAR CATHETERIZATION N/A 07/04/2015  ? Procedure: Porta Cath Insertion;  Surgeon: [name], MD;  Location: [place] [facility];  Service: Cardiovascular;  Laterality: N/A;  ? PORTA CATH REMOVAL N/A 06/23/2018  ? Procedure: PORTA CATH REMOVAL;  Surgeon: [name], MD;  Location: [place] [facility];  Service: Cardiovascular;  Laterality: N/A;  ? RIGHT/LEFT HEART CATH AND CORONARY ANGIOGRAPHY Bilateral 09/20/2019  ? Procedure: RIGHT/LEFT HEART CATH AND CORONARY ANGIOGRAPHY;  Surgeon: Nelva Bush, MD;  Location: [place] [facility];  Service: Cardiovascular;  Laterality: Bilateral;  ? ? ?FAMILY HISTORY :   ?Family History  ?Problem Relation Age of Onset  ? Diabetes Mother   ? Cholelithiasis Mother   ? Hypertension Sister   ? Diabetes Sister   ? Heart murmur Sister   ? Arthritis Brother   ? Breast cancer Neg Hx   ? ? ?SOCIAL HISTORY:   ?Social History  ? ?Tobacco Use  ? Smoking status: Never  ? Smokeless tobacco: Never  ?Vaping Use  ? Vaping Use: Never used  ?Substance Use Topics  ? Alcohol use: No  ? Drug use: No   ? ? ?ALLERGIES:  has No Known Allergies. ? ?MEDICATIONS:  ?Current Outpatient Medications  ?Medication Sig Dispense Refill  ? Acalabrutinib Maleate (CALQUENCE) 100 MG TABS Take 1 tablet (100 mg) by mouth in the morning and at bedtime. 60 tablet 2  ? acetaminophen (TYLENOL) 500 MG tablet Take 1,000 mg by mouth every 6 (six) hours as needed for mild pain.     ? albuterol (VENTOLIN HFA) 108 (90 Base) MCG/ACT inhaler 2 puffs inhaled orally every 4 to 6 hours as needed, not to exceed 12 puffs in 24 hours 18 each 1  ? apixaban (ELIQUIS) 5 MG TABS tablet Take 1 tablet (5 mg total) by mouth 2 (two) times daily. 60 tablet 2  ? benzonatate (TESSALON) 100 MG capsule Take 1 capsule (100 mg total) by mouth 2 (two) times daily as needed for cough. 60 capsule 0  ? carvedilol (COREG) 3.125 MG tablet TAKE 1 TABLET BY MOUTH TWICE A DAY 180 tablet 1  ? chlorpheniramine-HYDROcodone 10-8 MG/5ML Take 5 mLs by mouth at bedtime as needed for cough. 70 mL 0  ? cyanocobalamin 2000 MCG tablet Take 2,500 mcg by mouth daily.  (Patient not taking: Reported on 08/28/2021)    ? docusate sodium (COLACE) 100 MG capsule  Take 100 mg by mouth 2 (two) times daily as needed for mild constipation.    ? feeding supplement (ENSURE ENLIVE / ENSURE PLUS) LIQD Take 237 mLs by mouth 3 (three) times daily between meals. 237 mL 12  ? fluticasone (FLONASE) 50 MCG/ACT nasal spray PLACE 2 SPRAYS INTO BOTH NOSTRILS DAILY AS NEEDED FOR ALLERGIES. 48 mL 1  ? furosemide (LASIX) 20 MG tablet Take 1 tablet (20 mg total) by mouth daily. 30 tablet 1  ? ipratropium-albuterol (DUONEB) 0.5-2.5 (3) MG/3ML SOLN Take 3 mLs by nebulization every 6 (six) hours as needed. 1080 mL 0  ? losartan (COZAAR) 25 MG tablet Take 1 tablet (25 mg total) by mouth daily. Please hold until you see your primary care doctor 90 tablet 1  ? MEGARED OMEGA-3 KRILL OIL PO Take 1 tablet by mouth daily.    ? meloxicam (MOBIC) 7.5 MG tablet TAKE 1 TABLET BY MOUTH EVERY DAY AS NEEDED FOR PAIN 30 tablet 1  ?  Multiple Vitamins-Minerals (CENTRUM SILVER 50+WOMEN) TABS Take 1 tablet by mouth daily.    ? Multiple Vitamins-Minerals (HAIR SKIN AND NAILS FORMULA PO) Take 1 tablet by mouth daily.    ? ondansetron (ZOFRAN) 8 MG tabl

## 2021-09-28 ENCOUNTER — Encounter: Payer: Self-pay | Admitting: Family

## 2021-09-30 NOTE — Telephone Encounter (Signed)
FYI I spoke with patient refused to come in or ED today. She stated that she has been SOB since she had pneumonia & the ankle she does not feel is related. I advised this ma be true but without seeing her & patient stated that she her left ankle was swollen with being SOB we worry about blood clots. She stated that ankle is not painful, red or has heat to it. Pt stated that before she stopped working that it was swelling intermittently. She could not come in today due to her not having a ride she said & she did not want to go to ED. She said that she could not come tomorrow bc she had PT. I also advised that being SOB & having a swollen ankle took priority over the PT session. Pt still wanted to wait & has been scheduled to see you 8:30 on Wednesday. She was advised if worsening SOB, swelling, redness or pain in her ankle that she needed to go to ED ASAP.  ?

## 2021-10-01 LAB — IMMUNOGLOBULINS A/E/G/M, SERUM
IgA: 91 mg/dL (ref 64–422)
IgE (Immunoglobulin E), Serum: <2 IU/mL — ABNORMAL LOW (ref 6–495)
IgG (Immunoglobin G), Serum: 652 mg/dL (ref 586–1602)
IgM (Immunoglobulin M), Srm: 12 mg/dL — ABNORMAL LOW (ref 26–217)

## 2021-10-02 ENCOUNTER — Ambulatory Visit (INDEPENDENT_AMBULATORY_CARE_PROVIDER_SITE_OTHER): Payer: Medicare HMO

## 2021-10-02 ENCOUNTER — Other Ambulatory Visit (HOSPITAL_COMMUNITY): Payer: Self-pay

## 2021-10-02 ENCOUNTER — Other Ambulatory Visit: Payer: Self-pay

## 2021-10-02 ENCOUNTER — Encounter: Payer: Self-pay | Admitting: Emergency Medicine

## 2021-10-02 ENCOUNTER — Ambulatory Visit (INDEPENDENT_AMBULATORY_CARE_PROVIDER_SITE_OTHER): Payer: Medicare HMO | Admitting: Family

## 2021-10-02 ENCOUNTER — Telehealth: Payer: Self-pay | Admitting: *Deleted

## 2021-10-02 ENCOUNTER — Encounter: Payer: Self-pay | Admitting: Family

## 2021-10-02 ENCOUNTER — Inpatient Hospital Stay
Admission: EM | Admit: 2021-10-02 | Discharge: 2021-10-05 | DRG: 193 | Disposition: A | Discharge status: Home / Self Care | Payer: Medicare HMO | Attending: Internal Medicine | Admitting: Internal Medicine

## 2021-10-02 VITALS — BP 132/62 | HR 53 | Temp 95.2°F | Ht 67.0 in | Wt 236.2 lb

## 2021-10-02 DIAGNOSIS — K219 Gastro-esophageal reflux disease without esophagitis: Secondary | ICD-10-CM | POA: Diagnosis present

## 2021-10-02 DIAGNOSIS — R0602 Shortness of breath: Secondary | ICD-10-CM

## 2021-10-02 DIAGNOSIS — I428 Other cardiomyopathies: Secondary | ICD-10-CM | POA: Diagnosis not present

## 2021-10-02 DIAGNOSIS — R0902 Hypoxemia: Secondary | ICD-10-CM | POA: Diagnosis not present

## 2021-10-02 DIAGNOSIS — Z66 Do not resuscitate: Secondary | ICD-10-CM | POA: Diagnosis not present

## 2021-10-02 DIAGNOSIS — J189 Pneumonia, unspecified organism: Secondary | ICD-10-CM

## 2021-10-02 DIAGNOSIS — I110 Hypertensive heart disease with heart failure: Secondary | ICD-10-CM | POA: Diagnosis not present

## 2021-10-02 DIAGNOSIS — M25472 Effusion, left ankle: Secondary | ICD-10-CM | POA: Diagnosis not present

## 2021-10-02 DIAGNOSIS — Z7952 Long term (current) use of systemic steroids: Secondary | ICD-10-CM | POA: Diagnosis not present

## 2021-10-02 DIAGNOSIS — G4733 Obstructive sleep apnea (adult) (pediatric): Secondary | ICD-10-CM | POA: Diagnosis present

## 2021-10-02 DIAGNOSIS — I5042 Chronic combined systolic (congestive) and diastolic (congestive) heart failure: Secondary | ICD-10-CM | POA: Diagnosis present

## 2021-10-02 DIAGNOSIS — M069 Rheumatoid arthritis, unspecified: Secondary | ICD-10-CM | POA: Diagnosis present

## 2021-10-02 DIAGNOSIS — Z8572 Personal history of non-Hodgkin lymphomas: Secondary | ICD-10-CM | POA: Diagnosis not present

## 2021-10-02 DIAGNOSIS — Z833 Family history of diabetes mellitus: Secondary | ICD-10-CM

## 2021-10-02 DIAGNOSIS — J9621 Acute and chronic respiratory failure with hypoxia: Secondary | ICD-10-CM | POA: Diagnosis not present

## 2021-10-02 DIAGNOSIS — Z8616 Personal history of COVID-19: Secondary | ICD-10-CM | POA: Diagnosis not present

## 2021-10-02 DIAGNOSIS — C8311 Mantle cell lymphoma, lymph nodes of head, face, and neck: Secondary | ICD-10-CM | POA: Diagnosis present

## 2021-10-02 DIAGNOSIS — I5022 Chronic systolic (congestive) heart failure: Secondary | ICD-10-CM | POA: Diagnosis not present

## 2021-10-02 DIAGNOSIS — I447 Left bundle-branch block, unspecified: Secondary | ICD-10-CM | POA: Diagnosis present

## 2021-10-02 DIAGNOSIS — Z8249 Family history of ischemic heart disease and other diseases of the circulatory system: Secondary | ICD-10-CM

## 2021-10-02 DIAGNOSIS — Z9981 Dependence on supplemental oxygen: Secondary | ICD-10-CM | POA: Diagnosis not present

## 2021-10-02 DIAGNOSIS — E785 Hyperlipidemia, unspecified: Secondary | ICD-10-CM | POA: Diagnosis present

## 2021-10-02 DIAGNOSIS — Z79899 Other long term (current) drug therapy: Secondary | ICD-10-CM

## 2021-10-02 DIAGNOSIS — J90 Pleural effusion, not elsewhere classified: Secondary | ICD-10-CM | POA: Diagnosis not present

## 2021-10-02 DIAGNOSIS — I429 Cardiomyopathy, unspecified: Secondary | ICD-10-CM

## 2021-10-02 DIAGNOSIS — Z8261 Family history of arthritis: Secondary | ICD-10-CM | POA: Diagnosis not present

## 2021-10-02 DIAGNOSIS — Z7901 Long term (current) use of anticoagulants: Secondary | ICD-10-CM

## 2021-10-02 DIAGNOSIS — Z86711 Personal history of pulmonary embolism: Secondary | ICD-10-CM

## 2021-10-02 DIAGNOSIS — Z9221 Personal history of antineoplastic chemotherapy: Secondary | ICD-10-CM

## 2021-10-02 DIAGNOSIS — I2699 Other pulmonary embolism without acute cor pulmonale: Secondary | ICD-10-CM | POA: Diagnosis not present

## 2021-10-02 DIAGNOSIS — J9601 Acute respiratory failure with hypoxia: Secondary | ICD-10-CM | POA: Diagnosis present

## 2021-10-02 DIAGNOSIS — F419 Anxiety disorder, unspecified: Secondary | ICD-10-CM | POA: Diagnosis present

## 2021-10-02 DIAGNOSIS — Z791 Long term (current) use of non-steroidal anti-inflammatories (NSAID): Secondary | ICD-10-CM | POA: Diagnosis not present

## 2021-10-02 DIAGNOSIS — I5030 Unspecified diastolic (congestive) heart failure: Secondary | ICD-10-CM | POA: Diagnosis present

## 2021-10-02 DIAGNOSIS — R059 Cough, unspecified: Secondary | ICD-10-CM | POA: Diagnosis not present

## 2021-10-02 LAB — CBC WITH DIFFERENTIAL/PLATELET
Basophils Absolute: 0 10*3/uL (ref 0.0–0.1)
Basophils Relative: 0.1 % (ref 0.0–3.0)
Eosinophils Absolute: 0 10*3/uL (ref 0.0–0.7)
Eosinophils Relative: 0.2 % (ref 0.0–5.0)
HCT: 39.1 % (ref 36.0–46.0)
Hemoglobin: 12.5 g/dL (ref 12.0–15.0)
Lymphocytes Relative: 4.1 % — ABNORMAL LOW (ref 12.0–46.0)
Lymphs Abs: 0.8 10*3/uL (ref 0.7–4.0)
MCHC: 31.9 g/dL (ref 30.0–36.0)
MCV: 91.7 fl (ref 78.0–100.0)
Monocytes Absolute: 0.4 10*3/uL (ref 0.1–1.0)
Monocytes Relative: 2 % — ABNORMAL LOW (ref 3.0–12.0)
Neutro Abs: 17.5 10*3/uL — ABNORMAL HIGH (ref 1.4–7.7)
Neutrophils Relative %: 93.6 % — ABNORMAL HIGH (ref 43.0–77.0)
Platelets: 198 10*3/uL (ref 150.0–400.0)
RBC: 4.27 Mil/uL (ref 3.87–5.11)
RDW: 20.4 % — ABNORMAL HIGH (ref 11.5–15.5)
WBC: 18.8 10*3/uL (ref 4.0–10.5)

## 2021-10-02 LAB — COMPREHENSIVE METABOLIC PANEL
ALT: 19 U/L (ref 0–35)
AST: 18 U/L (ref 0–37)
Albumin: 3.7 g/dL (ref 3.5–5.2)
Alkaline Phosphatase: 51 U/L (ref 39–117)
BUN: 18 mg/dL (ref 6–23)
CO2: 30 mEq/L (ref 19–32)
Calcium: 9 mg/dL (ref 8.4–10.5)
Chloride: 100 mEq/L (ref 96–112)
Creatinine, Ser: 0.76 mg/dL (ref 0.40–1.20)
GFR: 74.91 mL/min (ref >60.00)
Glucose, Bld: 113 mg/dL — ABNORMAL HIGH (ref 70–99)
Potassium: 3.8 mEq/L (ref 3.5–5.1)
Sodium: 137 mEq/L (ref 135–145)
Total Bilirubin: 0.4 mg/dL (ref 0.2–1.2)
Total Protein: 6.3 g/dL (ref 6.0–8.3)

## 2021-10-02 LAB — RESP PANEL BY RT-PCR (FLU A&B, COVID) ARPGX2
Influenza A by PCR: NEGATIVE
Influenza B by PCR: NEGATIVE
SARS Coronavirus 2 by RT PCR: POSITIVE — AB

## 2021-10-02 LAB — D-DIMER, QUANTITATIVE: D-Dimer, Quant: 0.34 ug/mL-FEU (ref 0.00–0.50)

## 2021-10-02 LAB — PROCALCITONIN: Procalcitonin: <0.1 ng/mL

## 2021-10-02 LAB — BRAIN NATRIURETIC PEPTIDE: Pro B Natriuretic peptide (BNP): 50 pg/mL (ref 0.0–100.0)

## 2021-10-02 LAB — TROPONIN I (HIGH SENSITIVITY)
Troponin I (High Sensitivity): 12 ng/L (ref <18)
Troponin I (High Sensitivity): 12 ng/L (ref <18)

## 2021-10-02 MED ORDER — HYDROCOD POLI-CHLORPHE POLI ER 10-8 MG/5ML PO SUER
5.0000 mL | Freq: Every evening | ORAL | Status: DC | PRN
Start: 2021-10-02 — End: 2021-10-05
  Filled 2021-10-02: qty 5

## 2021-10-02 MED ORDER — IPRATROPIUM-ALBUTEROL 0.5-2.5 (3) MG/3ML IN SOLN: 3.0000 mL | Freq: Four times a day (QID) | RESPIRATORY_TRACT | 0 refills | Status: DC | PRN

## 2021-10-02 MED ORDER — ROSUVASTATIN CALCIUM 5 MG PO TABS
5.0000 mg | ORAL_TABLET | Freq: Every day | ORAL | Status: DC
Start: 2021-10-03 — End: 2021-10-05
  Administered 2021-10-03 – 2021-10-04 (×2): 5 mg via ORAL
  Filled 2021-10-02 (×2): qty 1

## 2021-10-02 MED ORDER — SODIUM CHLORIDE 0.9 % IV SOLN
2.0000 g | INTRAVENOUS | Status: DC
  Administered 2021-10-02 – 2021-10-03 (×2): 2 g via INTRAVENOUS
  Filled 2021-10-02 (×3): qty 20

## 2021-10-02 MED ORDER — ENSURE ENLIVE PO LIQD
237.0000 mL | Freq: Three times a day (TID) | ORAL | Status: DC
  Administered 2021-10-02 – 2021-10-05 (×8): 237 mL via ORAL

## 2021-10-02 MED ORDER — IPRATROPIUM-ALBUTEROL 0.5-2.5 (3) MG/3ML IN SOLN: 3.0000 mL | Freq: Four times a day (QID) | RESPIRATORY_TRACT | Status: DC | PRN

## 2021-10-02 MED ORDER — SODIUM CHLORIDE 0.9 % IV SOLN
500.0000 mg | INTRAVENOUS | Status: DC
  Administered 2021-10-02 – 2021-10-03 (×2): 500 mg via INTRAVENOUS
  Filled 2021-10-02 (×3): qty 5

## 2021-10-02 MED ORDER — POLYETHYLENE GLYCOL 3350 17 G PO PACK: 17.0000 g | PACK | Freq: Every day | ORAL | Status: DC | PRN

## 2021-10-02 MED ORDER — METHYLPREDNISOLONE SODIUM SUCC 40 MG IJ SOLR
40.0000 mg | Freq: Two times a day (BID) | INTRAMUSCULAR | Status: AC
  Administered 2021-10-03 (×2): 40 mg via INTRAVENOUS
  Filled 2021-10-02 (×2): qty 1

## 2021-10-02 MED ORDER — ONDANSETRON HCL 4 MG/2ML IJ SOLN: 4.0000 mg | Freq: Four times a day (QID) | INTRAMUSCULAR | Status: DC | PRN

## 2021-10-02 MED ORDER — ACETAMINOPHEN 650 MG RE SUPP: 650.0000 mg | Freq: Four times a day (QID) | RECTAL | Status: DC | PRN

## 2021-10-02 MED ORDER — MOLNUPIRAVIR EUA 200MG CAPSULE
4.0000 | ORAL_CAPSULE | Freq: Two times a day (BID) | ORAL | Status: DC
  Administered 2021-10-03 – 2021-10-05 (×6): 800 mg via ORAL
  Filled 2021-10-02 (×2): qty 4

## 2021-10-02 MED ORDER — NIRMATRELVIR/RITONAVIR (PAXLOVID)TABLET
3.0000 | ORAL_TABLET | Freq: Two times a day (BID) | ORAL | Status: DC
Start: 2021-10-02 — End: 2021-10-02

## 2021-10-02 MED ORDER — ONDANSETRON HCL 4 MG PO TABS: 4.0000 mg | ORAL_TABLET | Freq: Four times a day (QID) | ORAL | Status: DC | PRN

## 2021-10-02 MED ORDER — FLUTICASONE PROPIONATE 50 MCG/ACT NA SUSP: 2.0000 | Freq: Every day | NASAL | Status: DC | PRN

## 2021-10-02 MED ORDER — METHYLPREDNISOLONE SODIUM SUCC 125 MG IJ SOLR
125.0000 mg | Freq: Once | INTRAMUSCULAR | Status: AC
  Administered 2021-10-02: 125 mg via INTRAVENOUS
  Filled 2021-10-02: qty 2

## 2021-10-02 MED ORDER — ACETAMINOPHEN 325 MG PO TABS
650.0000 mg | ORAL_TABLET | Freq: Four times a day (QID) | ORAL | Status: DC | PRN
  Administered 2021-10-02: 650 mg via ORAL
  Filled 2021-10-02: qty 2

## 2021-10-02 MED ORDER — APIXABAN 5 MG PO TABS
5.0000 mg | ORAL_TABLET | Freq: Two times a day (BID) | ORAL | Status: DC
Start: 2021-10-02 — End: 2021-10-05
  Administered 2021-10-02 – 2021-10-05 (×6): 5 mg via ORAL
  Filled 2021-10-02 (×6): qty 1

## 2021-10-02 NOTE — Patient Instructions (Addendum)
Per hospital discharge paperwork, I would like for you remain on oxygen 4 L. ? ?I have given you a nebulizer treatment today, DuoNeb.  Please ensure that you are using this at home every 6 hours as needed every 6 hours as needed and call me with any questions. ? ?You may also start plain Mucinex to help further break up congestion. ? ?I have ordered left leg ultrasound. ?Let us know if you dont hear back within a couple of days in regards to an appointment being scheduled.  ? ? ?Please continue prednisone taper.  Please keep follow-up with pulmonology, Dr. Verlee Monte.  ? ? ?

## 2021-10-02 NOTE — Telephone Encounter (Signed)
I tried to call patient x2 but she did not answer & no VM set up.  ?

## 2021-10-02 NOTE — Assessment & Plan Note (Signed)
-   Rosuvastatin 5 mg nightly resumed ?

## 2021-10-02 NOTE — ED Provider Notes (Signed)
? ?[facility] ?Provider Note ? ? ? Event Date/Time  ? First MD Initiated Contact with Patient 10/02/21 1821   ?  (approximate) ? ? ?History  ? ?Pneumonia ? ? ?HPI ? ?TEVIS CONGER is a 81 y.o. female patient had had COVID and was continuing to be short of breath requiring about 4 L of oxygen chronically.  However lately she has developed a cough which is dry and increasing shortness of breath.  Chest x-ray done today at the office showed what appeared to be a multifocal pneumonia.  Patient's white count done earlier today was 18,000 with 93% polys. ? ?  ? ? ?Physical Exam  ? ?Triage Vital Signs: ?ED Triage Vitals  ?Enc Vitals Group  ?   BP 10/02/21 1400 105/75  ?   Pulse Rate 10/02/21 1400 75  ?   Resp 10/02/21 1400 18  ?   Temp 10/02/21 1400 98.3 ?F (36.8 ?C)  ?   Temp Source 10/02/21 1400 Oral  ?   SpO2 10/02/21 1400 98 %  ?   Weight --   ?   Height 10/02/21 1358 '5\' 7"'$  (1.702 m)  ?   Head Circumference --   ?   Peak Flow --   ?   Pain Score 10/02/21 1358 0  ?   Pain Loc --   ?   Pain Edu? --   ?   Excl. in [place]? --   ? ? ?Most recent vital signs: ?Vitals:  ? 10/02/21 1840 10/02/21 1900  ?BP:    ?Pulse:  81  ?Resp:    ?Temp:    ?SpO2: (!) 87% 100%  ? ? ? ?General: Awake, no distress while laying in bed.  When patient got up to ambulate she became acutely hypoxic. ?CV:  Good peripheral perfusion.  Heart regular rate and rhythm no audible murmur ?Resp:  Normal effort.  Decreased breath sounds but no obvious crackles or wheezes ?Abd:  No distention.  Soft and nontender no organomegaly ?Extremities: Slight edema in the left leg none on the right.  Patient reports this is chronic for her. ? ? ?ED Results / Procedures / Treatments  ? ?Labs ?(all labs ordered are listed, but only abnormal results are displayed) ?Labs Reviewed  ?PROCALCITONIN  ?D-DIMER, QUANTITATIVE  ?TROPONIN I (HIGH SENSITIVITY)  ?TROPONIN I (HIGH SENSITIVITY)  ? ? ? ?EK ?EKG read interpreted by me shows left bundle branch block  normal sinus rhythm at 83 normal axis compared to EKG from 319 patient has flipped T's inferiorly and in V5 and V6. ? ? ?RADIOLOGY ?Chest x-ray read by radiology reviewed by me possibly consistent with multifocal pneumonia ? ? ?PROCEDURES: ? ?Critical Care performed:  ? ?Procedures ? ? ?MEDICATIONS ORDERED IN ED: ?Medications - No data to display ? ? ?IMPRESSION / MDM / ASSESSMENT AND PLAN / ED COURSE  ?I reviewed the triage vital signs and the nursing notes. ?Patient with dry cough.  D-dimer and procalcitonin are still pending.  Patient walks to the toilet in the room before she gets there on her 4 L desats to 87.  We will get her in the hospital and work on her apparent pneumonia.  This pneumonia diagnosis is a course supported by the chest x-ray and the markedly elevated white count with shift to the left on the differential. ?\ ? ?The patient is on the cardiac monitor to evaluate for evidence of arrhythmia and/or significant heart rate changes.  None were seen ? ? ? ?  FINAL CLINICAL IMPRESSION(S) / ED DIAGNOSES  ? ?Final diagnoses:  ?Community acquired pneumonia, unspecified laterality  ?Hypoxia  ? ? ? ?Rx / DC Orders  ? ?ED Discharge Orders   ? ? None  ? ?  ? ? ? ?Note:  This document was prepared using Dragon voice recognition software and may include unintentional dictation errors. ?  ?Nena Polio, MD ?10/02/21 1908 ? ?

## 2021-10-02 NOTE — Assessment & Plan Note (Deleted)
Improved since hospitalization.  She experiences dyspnea on exertion with short distances and also with talking.  She was using 3 L oxygen nasal cannula and I recommended 4 L per hospital discharge.  Pending stat chest x-ray.  Presentation not consistent with fluid volume overload.  Pending BMP lab to further evaluate for heart failure decompensation.  She has not been using DuoNeb nebulizers at home have refilled this for her.  Advised her to start Mucinex.   follow-up with pulmonology ?

## 2021-10-02 NOTE — Assessment & Plan Note (Signed)
-   Currently at baseline 4 L nasal cannula ?- TOC consulted as patient may need to be discharged home with increased oxygenation with ambulation/exertion ?

## 2021-10-02 NOTE — H&P (Signed)
?History and Physical  ? ?Michelle [name]:[id number] DOB: [birth year] DOA: 10/02/2021 ? ?PCP: Burnard Hawthorne, FNP  ?Outpatient Specialists: Dr. Verlee Monte, pulmonology; Dr. Rogue Bussing, medical oncology ?Patient coming from: PCP ? ?I have personally briefly reviewed patient's old medical records in [place]. ? ?Chief Concern: Shortness of breath ? ?HPI: 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 ? ?Initial vitals in the emergency department showed temperature of 98.1, respiration rate of 18, heart rate 74, blood pressure 105/75, SPO2 of 98% on 4 L nasal cannula, and SPO2 desatted to the 80s with ambulation to the restroom on 4 L nasal cannula. ? ?Serum sodium is 137, potassium 3.8, chloride 100, bicarb 30, BUN of 18, serum creatinine of 0.76, nonfasting blood glucose 113, GFR greater than 74, WBC elevated at 18.8, hemoglobin 12.5, platelets of 198. ? ?ED treatment: None ? ?At bedside she is able to tell me her name, her age, her birthday, and she knows she is in the hospital.  She reports that the shortness of breath has not improved since COVID.  She states that with ambulation she feels worsening shortness of breath.  She denies chest pain, abdominal pain, dysuria, hematuria, diarrhea, nausea, vomiting.  She denies fever. She endorses productive cough that is now white phlegm. ? ?She reports that she tested positive for COVID at home at the beginning of March 2023. ? ?She endorses daily compliance Eliquis. ? ?Social history: She lives at home with her husband. ? ?ROS: ?Constitutional: no weight change, no fever ?ENT/Mouth: no sore throat, no rhinorrhea ?Eyes: no eye pain, no vision changes ?Cardiovascular: no chest pain, + dyspnea,  no edema, no  palpitations ?Respiratory: + cough, no sputum, no wheezing ?Gastrointestinal: no nausea, no vomiting, no diarrhea, no constipation ?Genitourinary: no urinary incontinence, no dysuria, no hematuria ?Musculoskeletal: no arthralgias, no myalgias ?Skin: no skin lesions, no pruritus, ?Neuro: no weakness, no loss of consciousness, no syncope ?Psych: no anxiety, no depression, + decrease appetite ?Heme/Lymph: no bruising, no bleeding ? ?ED Course: Discussed with emergency medicine provider, patient requiring hospitalization for chief concerns of acute on chronic hypoxic respiratory failure with ambulation. ? ?Assessment/Plan ? ?Principal Problem: ?  Multifocal pneumonia ?Active Problems: ?  Acute respiratory failure with hypoxia ([place]) ?  Acute pulmonary embolism ([place]) ?  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  GERD (gastroesophageal reflux disease) ?  Anxiety ?  LBBB (left bundle branch block) ?  HLD (hyperlipidemia) ?  SOB (shortness of breath) ?  Cardiomyopathy ([place]) ?  OSA (obstructive sleep apnea) ?  Left ankle swelling ?  (HFpEF) heart failure with preserved ejection fraction ([place]) ?  History of COVID-19 ?  ?Assessment and Plan: ? ?* Multifocal pneumonia ?- Procalcitonin not elevated, query patient not mounting a response ?- Continue with azithromycin and ceftriaxone ? ?Acute respiratory failure with hypoxia ([place]) ?- Currently at baseline 4 L nasal cannula ?- TOC consulted as patient may need to be discharged home with increased oxygenation with ambulation/exertion ? ?Acute pulmonary embolism ([place]) ?- Per CTA ordered on 08/29/2021 with revision on 08/30/2021 stating that patient likely had acute PE in the right upper lobe. ?- Eliquis 5 mg p.o. twice daily has been resumed ? ?Mantle cell lymphoma of lymph nodes of head,  face, and neck ([place]) ?- Status post CHOP x1 in 2017 and discontinued, Rituxan every 2 months, and was stopped due to pneumonia in 2019, status post ibrutinib 420 mg x 1 week,  discontinued due to severe rash; started acalbrutinib in August 2022 and then rituximab in September 2022 weekly. ?- Continue outpatient follow-up with medical oncology ? ?History of COVID-19 ?- Patient tested positive for COVID-19 and it was reported to patient's PCP in early March 2023 ?- Recheck COVID PCR ?- Ordered Solu-Medrol 125 mg IV one-time dose on day of admission; Solu-Medrol 40 mg IV twice daily, 2 doses ordered for 10/03/2021 ?- Paxlovid initiated, however contraindicated due to Eliquis; initiated molnupiravir per pharmacy recommendation ?- Admit to telemetry medical observation ? ?(HFpEF) heart failure with preserved ejection fraction ([place]) ?- Echo on 08/31/2021 was read as estimated ejection fraction of 60 to 65%, left ventricular diastolic parameters are consistent with grade 1 diastolic dysfunction ? 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 ?OSA (obstructive sleep apnea) ?- CPAP nightly ordered ? ?HLD (hyperlipidemia) ?- Rosuvastatin 5 mg nightly resumed ? ?Chart reviewed.  ? ?DVT prophylaxis: Eliquis ?Code Status: DNR/DNI ?Diet: Heart healthy ?Family Communication: No ?Disposition Plan: Pending clinical course ?Consults called: None at this time ?Admission status: Telemetry medical, observation ? ?Past Medical History:  ?Diagnosis Date  ? Arthritis   ? Collagen vascular disease ([place])   ? GERD (gastroesophageal reflux disease)   ? Headache(784.0)   ? HFrEF (heart failure with reduced ejection fraction) ([place])   ? Nonischemic cardiomyopathy, LVEF as low as 30-35% in 08/2019  ? History of methotrexate therapy   ? Hyperlipidemia   ? hx  ? Lymphadenopathy of head and neck 01/2015  ? see on Thyroid ultrasound  ? Lymphoma, mantle cell ([place]) 06/01/2015  ? bx of lymph node in right breast/Stage IV  Mantle Cell Lymphoma  ? Personal history of chemotherapy   ? Rheumatoid arthritis ([place])   ? ?Past Surgical History:  ?Procedure Laterality Date  ? BREAST BIOPSY Left 11/07/2020  ? u/s bx-hydromark #3 "coil"-path pending  ? CARDIAC CATHETERIZATION  09/2004  ? [facility]; EF 60%  ? CARDIAC CATHETERIZATION  08/2004  ? [place]  ? IR FLUORO GUIDED NEEDLE PLC ASPIRATION/INJECTION LOC  06/18/2018  ? PERIPHERAL VASCULAR CATHETERIZATION N/A 07/04/2015  ? Procedure: Porta Cath Insertion;  Surgeon: [name], MD;  Location: [place] [facility];  Service: Cardiovascular;  Laterality: N/A;  ? PORTA CATH REMOVAL N/A 06/23/2018  ? Procedure: PORTA CATH REMOVAL;  Surgeon: [name], MD;  Location: [place] [facility];  Service: Cardiovascular;  Laterality: N/A;  ? RIGHT/LEFT HEART CATH AND CORONARY ANGIOGRAPHY Bilateral 09/20/2019  ? Procedure: RIGHT/LEFT HEART CATH AND CORONARY ANGIOGRAPHY;  Surgeon: Nelva Bush, MD;  Location: [place] [facility];  Service: Cardiovascular;  Laterality: Bilateral;  ? ?Social History:  reports that she has never smoked. She has never used smokeless tobacco. She reports that she does not drink alcohol and does not use drugs. ? ?No Known Allergies ?Family History  ?Problem Relation Age of Onset  ? Diabetes Mother   ? Cholelithiasis Mother   ? Hypertension Sister   ? Diabetes Sister   ? Heart murmur Sister   ? Arthritis Brother   ? Breast cancer Neg Hx   ? ?Family  history: Family history reviewed and not pertinent. ? ?Prior to Admission medications   ?Medication Sig Start Date End Date Taking? Authorizing Provider  ?Acalabrutinib Maleate (CALQUENCE) 100 MG TABS Take 1 tablet (100 mg) by mouth in the morning and at bedtime. 07/15/21   Cammie Sickle, MD  ?acetaminophen (TYLENOL) 500 MG tablet Take 1,000 mg by mouth every 6 (six) hours as needed for mild pain.     [provider]  ?albuterol (VENTOLIN HFA) 108 (90 Base) MCG/ACT inhaler 2 puffs inhaled orally every 4 to 6 hours as needed, not  to exceed 12 puffs in 24 hours 08/02/21   Borders, Kirt Boys, NP  ?apixaban (ELIQUIS) 5 MG TABS tablet Take 1 tablet (5 mg total) by mouth 2 (two) times daily. 09/07/21   Lorella Nimrod, MD  ?benzonatate [facility]

## 2021-10-02 NOTE — Assessment & Plan Note (Signed)
Currently compliant with Eliquis 5 mg twice daily ?

## 2021-10-02 NOTE — Hospital Course (Addendum)
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 ? ?Initial vitals in the emergency department showed temperature of 98.1, respiration rate of 18, heart rate 74, blood pressure 105/75, SPO2 of 98% on 4 L nasal cannula, and SPO2 desatted to the 80s with ambulation to the restroom on 4 L nasal cannula. ? ?Serum sodium is 137, potassium 3.8, chloride 100, bicarb 30, BUN of 18, serum creatinine of 0.76, nonfasting blood glucose 113, GFR greater than 74, WBC elevated at 18.8, hemoglobin 12.5, platelets of 198. ? ?ED treatment: None ?

## 2021-10-02 NOTE — Progress Notes (Signed)
? ?Subjective:  ? ? Patient ID: Michelle [name], female    DOB: 1940-12-01, 81 y.o.   MRN: [id number] ? ?CC: Michelle [name] is a 81 y.o. female who presents today for an acute visit.   ? ?HPI: Accompanied by sister ? ?Chief complaint of SOB, cough x 4 weeks, improved overall.  ? ?SOB has improved since hospital discharge. She is okay when sitting however when starts walking , approx 20 steps, she will feel short of breath. She feels SOB when talking for long periods. She is wearing 3L today  ? ?Endorses thin to white congestion,occassional wheezing, productive cough.  ? ?No cp, palpitations, sinus pain, fever. ? ?She is not using Cipap during naps or at sleep. She needs a new prescription for cipap that goes into nose as opposed to having chin strap.  ? ?Using walker. In home OT and PT are coming to her house.  ? ?She is currently taking prednisone '10mg'$ , she currently thinks tapered down to '20mg'$  qd.  ?She is tessalon perles is not helping. She is using cough syrup ( she is not sure which medication she is on).  ?She is compliant for Eliquis 5 mg twice daily. She is not using albuterol inhaler ?She is not on DuoNeb as didn't have medication.  ? ?She has noticed left ankle swelling for years. Reports after an previous 'twisting injury'. Improves with elevation ? ?Hypertension-compliant with carvedilol 3.125 mg twice daily ?CHF- compliant with Lasix 20 mg daily. Weight stable.  ? ? ? ? ?Follow-up pulmonology 10/08/2021 Dr Verlee Monte  ? ?Admitted 08/28/2021 and discharged 09/07/2021 for acute respiratory failure,community-acquired pneumonia, acute pulmonary embolism ? ?History of COVID-18 August 2021. ? ?Treated with Augmentin 08/16/21 and then presented to emergency room 08/28/21 for worsening shortness of breath and cough. ? ?Stage IV mantle lymphoma ? ?08/29/21 CTA positive for PE. Started on eliquis ? ?Suspected reinfection of COVID or prolonged COVID as she was positive for COVID on 09/03/2021 ? ?Consult with Dr Verlee Monte  09/02/21 ? ?Treated with Solu medrol during hospital course. convalescent plasma 09/04/21.  6 L oxygen ? ?09/04/2021 change from Solu-Medrol to prednisone with taper. ? ?She declined SNF at discharge.  Discharged on 4 L ? ?Discharge with DuoNeb.  Decrease dose of Lasix to 20 mg.  Holding losartan and spironolactone ? ?Hyperlipidemia-compliant with Crestor 5 mg ? ?09/06/21 Chest x-ray showed diffuse bilateral heterogeneous and interstitial pulmonary opacity ? ?09/02/2021 CT chest worsening multifocal pneumonia, mild congestive heart failure superimposed on chronic interstitial lung disease.  New trace bilateral pleural effusions.  Unchanged dilatation of main pulmonary artery suggestive of pulmonary arterial hypertension, atherosclerosis ? ?08/31/2021 echocardiogram bubble study left ventricular ejection fraction 60 to 65%.  Moderate left ventricular hypertrophy.  Grade 1 diastolic dysfunction.  Left and right atrial size moderately dilated.  ? ?Follows with cardiology last seen 07/17/2021, Dr End for chronic heart failure reduced ejection fraction. Follow up scheduled 01/22/22 ?HISTORY:  ?Past Medical History:  ?Diagnosis Date  ? Arthritis   ? Collagen vascular disease ([place])   ? GERD (gastroesophageal reflux disease)   ? Headache(784.0)   ? HFrEF (heart failure with reduced ejection fraction) ([place])   ? Nonischemic cardiomyopathy, LVEF as low as 30-35% in 08/2019  ? History of methotrexate therapy   ? Hyperlipidemia   ? hx  ? Lymphadenopathy of head and neck 01/2015  ? see on Thyroid ultrasound  ? Lymphoma, mantle cell ([place]) 06/01/2015  ? bx of lymph node in right breast/Stage IV Mantle Cell  Lymphoma  ? Personal history of chemotherapy   ? Rheumatoid arthritis ([place])   ? ?Past Surgical History:  ?Procedure Laterality Date  ? BREAST BIOPSY Left 11/07/2020  ? u/s bx-hydromark #3 "coil"-path pending  ? CARDIAC CATHETERIZATION  09/2004  ? [facility]; EF 60%  ? CARDIAC CATHETERIZATION  08/2004  ? [place]  ? IR FLUORO GUIDED NEEDLE PLC  ASPIRATION/INJECTION LOC  06/18/2018  ? PERIPHERAL VASCULAR CATHETERIZATION N/A 07/04/2015  ? Procedure: Porta Cath Insertion;  Surgeon: [name], MD;  Location: [place] [facility];  Service: Cardiovascular;  Laterality: N/A;  ? PORTA CATH REMOVAL N/A 06/23/2018  ? Procedure: PORTA CATH REMOVAL;  Surgeon: [name], MD;  Location: [place] [facility];  Service: Cardiovascular;  Laterality: N/A;  ? RIGHT/LEFT HEART CATH AND CORONARY ANGIOGRAPHY Bilateral 09/20/2019  ? Procedure: RIGHT/LEFT HEART CATH AND CORONARY ANGIOGRAPHY;  Surgeon: Nelva Bush, MD;  Location: [place] [facility];  Service: Cardiovascular;  Laterality: Bilateral;  ? ?Family History  ?Problem Relation Age of Onset  ? Diabetes Mother   ? Cholelithiasis Mother   ? Hypertension Sister   ? Diabetes Sister   ? Heart murmur Sister   ? Arthritis Brother   ? Breast cancer Neg Hx   ? ? ?Allergies: Patient has no known allergies. ?Current Outpatient Medications on File Prior to Visit  ?Medication Sig Dispense Refill  ? Acalabrutinib Maleate (CALQUENCE) 100 MG TABS Take 1 tablet (100 mg) by mouth in the morning and at bedtime. 60 tablet 2  ? acetaminophen (TYLENOL) 500 MG tablet Take 1,000 mg by mouth every 6 (six) hours as needed for mild pain.     ? albuterol (VENTOLIN HFA) 108 (90 Base) MCG/ACT inhaler 2 puffs inhaled orally every 4 to 6 hours as needed, not to exceed 12 puffs in 24 hours 18 each 1  ? apixaban (ELIQUIS) 5 MG TABS tablet Take 1 tablet (5 mg total) by mouth 2 (two) times daily. 60 tablet 2  ? benzonatate (TESSALON) 100 MG capsule Take 1 capsule (100 mg total) by mouth 2 (two) times daily as needed for cough. 60 capsule 0  ? carvedilol (COREG) 3.125 MG tablet TAKE 1 TABLET BY MOUTH TWICE A DAY 180 tablet 1  ? chlorpheniramine-HYDROcodone 10-8 MG/5ML Take 5 mLs by mouth at bedtime as needed for cough. 70 mL 0  ? docusate sodium (COLACE) 100 MG capsule Take 100 mg by mouth 2 (two) times daily as needed for mild constipation.    ?  feeding supplement (ENSURE ENLIVE / ENSURE PLUS) LIQD Take 237 mLs by mouth 3 (three) times daily between meals. 237 mL 12  ? fluticasone (FLONASE) 50 MCG/ACT nasal spray PLACE 2 SPRAYS INTO BOTH NOSTRILS DAILY AS NEEDED FOR ALLERGIES. 48 mL 1  ? furosemide (LASIX) 20 MG tablet Take 1 tablet (20 mg total) by mouth daily. 30 tablet 1  ? MEGARED OMEGA-3 KRILL OIL PO Take 1 tablet by mouth daily.    ? meloxicam (MOBIC) 7.5 MG tablet TAKE 1 TABLET BY MOUTH EVERY DAY AS NEEDED FOR PAIN 30 tablet 1  ? Multiple Vitamins-Minerals (CENTRUM SILVER 50+WOMEN) TABS Take 1 tablet by mouth daily.    ? Multiple Vitamins-Minerals (HAIR SKIN AND NAILS FORMULA PO) Take 1 tablet by mouth daily.    ? ondansetron (ZOFRAN) 8 MG tablet ONE PILL EVERY 8 HOURS AS NEEDED FOR NAUSEA/VOMITTING. 40 tablet 1  ? predniSONE (DELTASONE) 10 MG tablet Take 6 tablets (60 mg total) by mouth daily with breakfast. For  3 days, then decrease 1 tablet each week and finish all the medicine. 120 tablet 0  ? rosuvastatin (CRESTOR) 5 MG tablet TAKE 1 TABLET (5 MG TOTAL) BY MOUTH DAILY. 90 tablet 3  ? cyanocobalamin 2000 MCG tablet Take 2,500 mcg by mouth daily.  (Patient not taking: Reported on 08/28/2021)    ? ?Current Facility-Administered Medications on File Prior to Visit  ?Medication Dose Route Frequency Provider Last Rate Last Admin  ? heparin lock flush 100 unit/mL  500 Units Intravenous Once Charlaine Dalton R, MD      ? sodium chloride flush (NS) 0.9 % injection 10 mL  10 mL Intravenous Once Charlaine Dalton R, MD      ? sodium chloride flush (NS) 0.9 % injection 10 mL  10 mL Intravenous Once Cammie Sickle, MD      ? ? ?Social History  ? ?Tobacco Use  ? Smoking status: Never  ? Smokeless tobacco: Never  ?Vaping Use  ? Vaping Use: Never used  ?Substance Use Topics  ? Alcohol use: No  ? Drug use: No  ? ? ?Review of Systems  ?Constitutional:  Negative for chills and fever.  ?HENT:  Positive for congestion. Negative for sinus pressure.    ?Respiratory:  Positive for cough, shortness of breath and wheezing.   ?Cardiovascular:  Positive for leg swelling (left ankle). Negative for chest pain and palpitations.  ?Gastrointestinal:  Negative for nausea and vomiting.  ?

## 2021-10-02 NOTE — Assessment & Plan Note (Signed)
-   CPAP nightly ordered 

## 2021-10-02 NOTE — Assessment & Plan Note (Addendum)
Improved since hospitalization.  Reviewed previous hospitalization.  she continues to experiences dyspnea on exertion with short distances and also with talking.  She was using 3 L oxygen nasal cannula and I recommended 4 L per hospital discharge.  Presentation not consistent with fluid volume overload, BNP 50. She is compliant with lasix '20mg'$  qd. ?She has not been using DuoNeb nebulizers at home have refilled this for her.  Advised her to start Mucinex.   follow-up with pulmonology 10/08/21.  ?Clinically patient appeared stable in exam room as well as felt better after Duoneb given in office.  However I expressed my continued concern with co morbidities. She had run out of o2 while in the office. We called the medical supply company and have arranged for re education ( filling 02) and asked for company to bring new 02 to our office. They were unable to today and therefore patient and I agreed that she should be transported via 911 to home while we await results. While on her way home, CXR and CBC results returned showing multifocal bilateral pneumonia and leukocytosis, left shift.  I have called patient myselt and advised ED. She verbalized understanding and was currently in EMS vehicle going to ED.I have also called [facility] Triage to give report.  ?

## 2021-10-02 NOTE — Assessment & Plan Note (Signed)
History consistent with chronic left ankle swelling.  However out of abundance of caution due to patient's limited mobility, I ordered a stat left leg ultrasound to rule out DVT.  She is compliant with Eliquis 5 mg twice daily ?

## 2021-10-02 NOTE — Assessment & Plan Note (Addendum)
-   Chronic and unchanged for years ?- No indications for left lower extremity ultrasound to assess for DVT at this time given that this is chronic left ankle swelling, gravity dependent and patient is compliant with her home Eliquis ?

## 2021-10-02 NOTE — Telephone Encounter (Addendum)
CRITICAL VALUE STICKER ? ?CRITICAL VALUE: WBC-18.8 ? ?RECEIVER (on-site recipient of call): Jari Favre, CMA/XT ? ?DATE & TIME NOTIFIED: 10/02/21 @ 11:46am ? ?MESSENGER (representative from lab): Santiago Glad ? ?MD NOTIFIED: Rerouted to Dr. Derrel Nip in Arnett's absence ? ?TIME OF NOTIFICATION: 11:48am ? ?RESPONSE: 1) please see STAT CXR performed in office today ?2) Lab is sending off a Path Smear ? ?

## 2021-10-02 NOTE — Assessment & Plan Note (Addendum)
-   Procalcitonin not elevated, query patient not mounting a response ?- Treated with empiric azithromycin and ceftriaxone ?-- Transition to p.o. Augmentin to complete course ?

## 2021-10-02 NOTE — Assessment & Plan Note (Signed)
-   Per CTA ordered on 08/29/2021 with revision on 08/30/2021 stating that patient likely had acute PE in the right upper lobe. ?- Eliquis 5 mg p.o. twice daily has been resumed ?

## 2021-10-02 NOTE — ED Triage Notes (Addendum)
Pt via EMS from [facility] pts PCP, and they did a CXR and it showed possible PNA. Pt is on 4L [place] chronically since COVID PNA in April. Denies SOB unless exerting herself, denies chest pain. Pt has a hx of lymphoma and is actively on chemotherapy. Pt is A&OX4 and NAD ? ?PCP did CXR, CBC, CMP, and BNP.  ?

## 2021-10-02 NOTE — Assessment & Plan Note (Addendum)
-   Patient tested positive for COVID-19 and it was reported to patient's PCP in early March 2023 ?- COVID PCR positive on admission ?- Transition IV Solu-Medrol to prednisone 40 mg daily and taper ?- Paxlovid initiated, however contraindicated due to Eliquis; initiated molnupiravir -continue ?

## 2021-10-02 NOTE — Telephone Encounter (Signed)
Her white count is up and her chest x ray shows persistent mutlifocal infiltrates (pneumonia involving multiple lobes) . I don't know what condition this patient was in because Margaret's note was not done, but based on this data and her age I would recommend that she return to the ER  ?

## 2021-10-02 NOTE — ED Triage Notes (Signed)
First Nurse Note:  Seen by Smitty Pluck today, sent to ED for pneumonia. ? ?VS:   ? ? ?19/74 ?RR:  18-20 ?78 P ?Sats 98-99 on 4l/[place] ? ?Patient wearing 4l/ [place] home oxygen since COVID pneumonia around Easter time. ? ?AAOx3.  Skin warm and dry. No SOB/ DOE noted. ?

## 2021-10-02 NOTE — Assessment & Plan Note (Signed)
-   Echo on 08/31/2021 was read as estimated ejection fraction of 60 to 65%, left ventricular diastolic parameters are consistent with grade 1 diastolic dysfunction ?

## 2021-10-02 NOTE — ED Notes (Signed)
This RN sent lt green, purple, blue, lactic, and 1 set of blood cultures to lab at this time.  ? ?Verbal orders from Dr. Jari Pigg to add troponin at this time.  ?

## 2021-10-02 NOTE — Assessment & Plan Note (Signed)
Chronic.  No evidence of fluid volume overload.  She is compliant with carvedilol 3.125 mg twice dail.,  Lasix 20 mg daily.  She is currently not on spironolactone or losartan as discontinued during prior hospitalization due to low blood pressures. ?

## 2021-10-02 NOTE — Assessment & Plan Note (Signed)
-   Status post CHOP x1 in 2017 and discontinued, Rituxan every 2 months, and was stopped due to pneumonia in 2019, status post ibrutinib 420 mg x 1 week, discontinued due to severe rash; started acalbrutinib in August 2022 and then rituximab in September 2022 weekly. ?- Continue outpatient follow-up with medical oncology ?

## 2021-10-02 NOTE — Telephone Encounter (Signed)
FYI after trying ti call patient & based on results of xray I called back EMS non emergent line to see if they were still in route with patient. I spoke with Jackelyn Poling who was able to confirm patient & location of her drop off. She will notify the EMT's that picked patient up from our office they need to go back & get her to go to ED for pneumonia in multiple lobes of her lungs.  ?

## 2021-10-03 ENCOUNTER — Telehealth: Payer: Self-pay | Admitting: *Deleted

## 2021-10-03 ENCOUNTER — Telehealth: Payer: Self-pay | Admitting: Family

## 2021-10-03 ENCOUNTER — Other Ambulatory Visit: Payer: Self-pay

## 2021-10-03 DIAGNOSIS — I5042 Chronic combined systolic (congestive) and diastolic (congestive) heart failure: Secondary | ICD-10-CM | POA: Diagnosis present

## 2021-10-03 DIAGNOSIS — Z7952 Long term (current) use of systemic steroids: Secondary | ICD-10-CM | POA: Diagnosis not present

## 2021-10-03 DIAGNOSIS — Z8572 Personal history of non-Hodgkin lymphomas: Secondary | ICD-10-CM | POA: Diagnosis not present

## 2021-10-03 DIAGNOSIS — Z66 Do not resuscitate: Secondary | ICD-10-CM | POA: Diagnosis present

## 2021-10-03 DIAGNOSIS — Z9981 Dependence on supplemental oxygen: Secondary | ICD-10-CM | POA: Diagnosis not present

## 2021-10-03 DIAGNOSIS — G4733 Obstructive sleep apnea (adult) (pediatric): Secondary | ICD-10-CM | POA: Diagnosis present

## 2021-10-03 DIAGNOSIS — Z8261 Family history of arthritis: Secondary | ICD-10-CM | POA: Diagnosis not present

## 2021-10-03 DIAGNOSIS — J40 Bronchitis, not specified as acute or chronic: Secondary | ICD-10-CM

## 2021-10-03 DIAGNOSIS — Z8616 Personal history of COVID-19: Secondary | ICD-10-CM | POA: Diagnosis not present

## 2021-10-03 DIAGNOSIS — I110 Hypertensive heart disease with heart failure: Secondary | ICD-10-CM | POA: Diagnosis present

## 2021-10-03 DIAGNOSIS — Z7901 Long term (current) use of anticoagulants: Secondary | ICD-10-CM | POA: Diagnosis not present

## 2021-10-03 DIAGNOSIS — Z833 Family history of diabetes mellitus: Secondary | ICD-10-CM | POA: Diagnosis not present

## 2021-10-03 DIAGNOSIS — M069 Rheumatoid arthritis, unspecified: Secondary | ICD-10-CM | POA: Diagnosis present

## 2021-10-03 DIAGNOSIS — K219 Gastro-esophageal reflux disease without esophagitis: Secondary | ICD-10-CM | POA: Diagnosis present

## 2021-10-03 DIAGNOSIS — Z79899 Other long term (current) drug therapy: Secondary | ICD-10-CM | POA: Diagnosis not present

## 2021-10-03 DIAGNOSIS — I447 Left bundle-branch block, unspecified: Secondary | ICD-10-CM | POA: Diagnosis present

## 2021-10-03 DIAGNOSIS — Z86711 Personal history of pulmonary embolism: Secondary | ICD-10-CM | POA: Diagnosis not present

## 2021-10-03 DIAGNOSIS — Z8249 Family history of ischemic heart disease and other diseases of the circulatory system: Secondary | ICD-10-CM | POA: Diagnosis not present

## 2021-10-03 DIAGNOSIS — E785 Hyperlipidemia, unspecified: Secondary | ICD-10-CM | POA: Diagnosis present

## 2021-10-03 DIAGNOSIS — Z9221 Personal history of antineoplastic chemotherapy: Secondary | ICD-10-CM | POA: Diagnosis not present

## 2021-10-03 DIAGNOSIS — J189 Pneumonia, unspecified organism: Secondary | ICD-10-CM | POA: Diagnosis present

## 2021-10-03 DIAGNOSIS — I428 Other cardiomyopathies: Secondary | ICD-10-CM | POA: Diagnosis present

## 2021-10-03 DIAGNOSIS — R0902 Hypoxemia: Secondary | ICD-10-CM | POA: Diagnosis present

## 2021-10-03 DIAGNOSIS — J9621 Acute and chronic respiratory failure with hypoxia: Secondary | ICD-10-CM | POA: Diagnosis present

## 2021-10-03 DIAGNOSIS — Z791 Long term (current) use of non-steroidal anti-inflammatories (NSAID): Secondary | ICD-10-CM | POA: Diagnosis not present

## 2021-10-03 LAB — BASIC METABOLIC PANEL
Anion gap: 5 (ref 5–15)
BUN: 22 mg/dL (ref 8–23)
CO2: 28 mmol/L (ref 22–32)
Calcium: 8.9 mg/dL (ref 8.9–10.3)
Chloride: 102 mmol/L (ref 98–111)
Creatinine, Ser: 0.65 mg/dL (ref 0.44–1.00)
GFR, Estimated: >60 mL/min (ref >60)
Glucose, Bld: 171 mg/dL — ABNORMAL HIGH (ref 70–99)
Potassium: 5 mmol/L (ref 3.5–5.1)
Sodium: 135 mmol/L (ref 135–145)

## 2021-10-03 LAB — CBC
HCT: 37.2 % (ref 36.0–46.0)
Hemoglobin: 11.6 g/dL — ABNORMAL LOW (ref 12.0–15.0)
MCH: 29.1 pg (ref 26.0–34.0)
MCHC: 31.2 g/dL (ref 30.0–36.0)
MCV: 93.5 fL (ref 80.0–100.0)
Platelets: 201 10*3/uL (ref 150–400)
RBC: 3.98 MIL/uL (ref 3.87–5.11)
RDW: 19.3 % — ABNORMAL HIGH (ref 11.5–15.5)
WBC: 12.6 10*3/uL — ABNORMAL HIGH (ref 4.0–10.5)
nRBC: 0 % (ref 0.0–0.2)

## 2021-10-03 NOTE — Telephone Encounter (Signed)
Refill request for albuterol.

## 2021-10-03 NOTE — Progress Notes (Signed)
Pt does not want to wear a CPAP. Pt is aware that if she changes her mind one will be made available to her. ?

## 2021-10-03 NOTE — Progress Notes (Signed)
?Progress Note ? ? ?Patient: Michelle [name] TMA:[id number] DOB: [birth year] DOA: 10/02/2021     0 ?DOS: the patient was seen and examined on 10/03/2021 ?  ?Brief hospital course: ?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 ? ?Initial vitals in the emergency department showed temperature of 98.1, respiration rate of 18, heart rate 74, blood pressure 105/75, SPO2 of 98% on 4 L nasal cannula, and SPO2 desatted to the 80s with ambulation to the restroom on 4 L nasal cannula. ? ?Serum sodium is 137, potassium 3.8, chloride 100, bicarb 30, BUN of 18, serum creatinine of 0.76, nonfasting blood glucose 113, GFR greater than 74, WBC elevated at 18.8, hemoglobin 12.5, platelets of 198. ? ?ED treatment: None ? ?Assessment and Plan: ?* Multifocal pneumonia ?- Procalcitonin not elevated, query patient not mounting a response ?- Continue with azithromycin and ceftriaxone ? ?Acute respiratory failure with hypoxia ([place]) ?- Currently at baseline 4 L nasal cannula ?- TOC consulted as patient may need to be discharged home with increased oxygenation with ambulation/exertion ? ?Acute pulmonary embolism ([place]) ?- Per CTA ordered on 08/29/2021 with revision on 08/30/2021 stating that patient likely had acute PE in the right upper lobe. ?- Eliquis 5 mg p.o. twice daily has been resumed ? 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 ?- Continue outpatient follow-up with medical oncology ? ?History of  COVID-19 ?- Patient tested positive for COVID-19 and it was reported to patient's PCP in early March 2023 ?- Recheck COVID PCR ?- Ordered Solu-Medrol 125 mg IV one-time dose on day of admission; Solu-Medrol 40 mg IV twice daily, 2 doses ordered for 10/03/2021 ?- Paxlovid initiated, however contraindicated due to Eliquis; initiated molnupiravir per pharmacy recommendation ?- Admit to telemetry medical observation ? ?(HFpEF) heart failure with preserved ejection fraction ([place]) ?- Echo on 08/31/2021 was read as estimated ejection fraction of 60 to 65%, left ventricular diastolic parameters are consistent with grade 1 diastolic dysfunction ? 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 ?OSA (obstructive sleep apnea) ?- CPAP nightly ordered ? ?HLD (hyperlipidemia) ?- Rosuvastatin 5 mg nightly resumed ? ? ? ? ?  ? ?Subjective: Patient awake sitting up in bed when seen today.  She reports feeling somewhat improved since admission.  She says nebulizer treatments help with her breathing and cough.  No other acute complaints at this time. ? ?Physical Exam: ?Vitals:  ? 10/02/21 2112 10/02/21 2128 10/02/21 2258 10/03/21 0616  ?BP:  (!) 97/57 (!) 153/80 91/61  ?Pulse:  77  74  ?Resp:  18  20  ?Temp:  97.8 ?F (36.6 ?C)  98.4 ?F (36.9 ?C)  ?TempSrc:    Oral  ?SpO2:  100%  100%  ?Weight: 106.1 kg     ?Height: '5\' 7"'$  (1.702 m)     ? ?  General exam: awake, alert, no acute distress ?HEENT: atraumatic, clear conjunctiva, anicteric sclera, moist mucus membranes, hearing grossly normal  ?Respiratory system: CTAB, conversational dyspnea, using accessory muscles, shallow inspirations no wheezes, on 4 L/min [place] O2. ?Cardiovascular system: normal S1/S2,  RRR, no pedal edema.   ?Gastrointestinal system: soft, NT, ND, no HSM felt, +bowel  sounds. ?Central nervous system: A&O x3. no gross focal neurologic deficits, normal speech ?Extremities: moves all, no edema, normal tone ?Skin: dry, intact, normal temperature, normal color, No rashes, lesions or ulcers ?Psychiatry: normal mood, congruent affect, judgement and insight appear normal ? ? ?Data Reviewed: ? ?Notable labs: Glucose 171, WBC 12.6 improved, hemoglobin 11.6 ? ?Family Communication: None ? ?Disposition: ?Status is: Inpatient ?Remains inpatient appropriate because: Severity of illness remaining on IV therapies as outlined above and on oxygen supplementation not typically needed at baseline. ? ? ? ? Planned Discharge Destination: Home ? ? ? ?Time spent: 35 minutes ? ?Author: ?Ezekiel Slocumb, DO ?10/03/2021 2:28 PM ? ?For on call review [email or web address].  ?

## 2021-10-03 NOTE — Plan of Care (Signed)
?  Problem: Activity: Goal: Ability to tolerate increased activity will improve Outcome: Progressing   Problem: Clinical Measurements: Goal: Ability to maintain a body temperature in the normal range will improve Outcome: Progressing   Problem: Respiratory: Goal: Ability to maintain adequate ventilation will improve Outcome: Progressing Goal: Ability to maintain a clear airway will improve Outcome: Progressing   Problem: Education: Goal: Knowledge of risk factors and measures for prevention of condition will improve Outcome: Progressing   Problem: Coping: Goal: Psychosocial and spiritual needs will be supported Outcome: Progressing   Problem: Respiratory: Goal: Will maintain a patent airway Outcome: Progressing Goal: Complications related to the disease process, condition or treatment will be avoided or minimized Outcome: Progressing   Problem: Education: Goal: Knowledge of General Education information will improve Description: Including pain rating scale, medication(s)/side effects and non-pharmacologic comfort measures Outcome: Progressing   Problem: Health Behavior/Discharge Planning: Goal: Ability to manage health-related needs will improve Outcome: Progressing   Problem: Clinical Measurements: Goal: Ability to maintain clinical measurements within normal limits will improve Outcome: Progressing Goal: Will remain free from infection Outcome: Progressing Goal: Diagnostic test results will improve Outcome: Progressing Goal: Respiratory complications will improve Outcome: Progressing Goal: Cardiovascular complication will be avoided Outcome: Progressing   Problem: Activity: Goal: Risk for activity intolerance will decrease Outcome: Progressing   Problem: Nutrition: Goal: Adequate nutrition will be maintained Outcome: Progressing   Problem: Coping: Goal: Level of anxiety will decrease Outcome: Progressing   Problem: Elimination: Goal: Will not experience  complications related to bowel motility Outcome: Progressing Goal: Will not experience complications related to urinary retention Outcome: Progressing   Problem: Pain Managment: Goal: General experience of comfort will improve Outcome: Progressing   Problem: Safety: Goal: Ability to remain free from injury will improve Outcome: Progressing   Problem: Skin Integrity: Goal: Risk for impaired skin integrity will decrease Outcome: Progressing   

## 2021-10-03 NOTE — TOC Progression Note (Signed)
Transition of Care (TOC) - Progression Note  ? ? ?Patient Details  ?Name: Michelle [name] ?MRN: [id number] ?Date of Birth: [birth year] ? ?Transition of Care (TOC) CM/SW Contact  ?Conception Oms, RN ?Phone Number: ?10/03/2021, 12:27 PM ? ?Clinical Narrative:    ? ?Patient is open with [facility] for PT, OT and aide, will cont at DC, will need resumption of care orders ? ?  ?  ? ?Expected Discharge Plan and Services ?  ?  ?  ?  ?  ?                ?  ?  ?  ?  ?  ?  ?  ?  ?  ?  ? ? ?Social Determinants of Health (SDOH) Interventions ?  ? ?Readmission Risk Interventions ? ?  08/30/2021  ?  1:35 PM  ?Readmission Risk Prevention Plan  ?Post Dischage Appt Complete  ?Medication Screening Complete  ?Transportation Screening Complete  ? ? ?

## 2021-10-03 NOTE — Telephone Encounter (Signed)
Request from pharmacy for refill of Albuterol. Patient is currently admitted with Pneumonia since yesterday ?

## 2021-10-03 NOTE — Telephone Encounter (Signed)
Pt called stating medication that was prescribed to her is on back order. Pt want to know if provider can write a generic medication so she can get it ?

## 2021-10-03 NOTE — Telephone Encounter (Signed)
Med refill order sent to Dr. Jacinto Reap. ?

## 2021-10-03 NOTE — Plan of Care (Signed)
?  Problem: Activity: ?Goal: Ability to tolerate increased activity will improve ?Outcome: Progressing ?  ?Problem: Respiratory: ?Goal: Ability to maintain a clear airway will improve ?Outcome: Progressing ?  ?Problem: Respiratory: ?Goal: Will maintain a patent airway ?Outcome: Progressing ?  ?

## 2021-10-04 ENCOUNTER — Encounter: Payer: Self-pay | Admitting: Internal Medicine

## 2021-10-04 ENCOUNTER — Telehealth: Payer: Self-pay | Admitting: Internal Medicine

## 2021-10-04 DIAGNOSIS — J189 Pneumonia, unspecified organism: Secondary | ICD-10-CM | POA: Diagnosis not present

## 2021-10-04 LAB — CBC
HCT: 39.1 % (ref 36.0–46.0)
Hemoglobin: 11.8 g/dL — ABNORMAL LOW (ref 12.0–15.0)
MCH: 28.2 pg (ref 26.0–34.0)
MCHC: 30.2 g/dL (ref 30.0–36.0)
MCV: 93.5 fL (ref 80.0–100.0)
Platelets: 209 10*3/uL (ref 150–400)
RBC: 4.18 MIL/uL (ref 3.87–5.11)
RDW: 19.4 % — ABNORMAL HIGH (ref 11.5–15.5)
WBC: 14.3 10*3/uL — ABNORMAL HIGH (ref 4.0–10.5)
nRBC: 0 % (ref 0.0–0.2)

## 2021-10-04 LAB — BASIC METABOLIC PANEL
Anion gap: 9 (ref 5–15)
BUN: 23 mg/dL (ref 8–23)
CO2: 28 mmol/L (ref 22–32)
Calcium: 9.1 mg/dL (ref 8.9–10.3)
Chloride: 101 mmol/L (ref 98–111)
Creatinine, Ser: 0.62 mg/dL (ref 0.44–1.00)
GFR, Estimated: >60 mL/min (ref >60)
Glucose, Bld: 159 mg/dL — ABNORMAL HIGH (ref 70–99)
Potassium: 4.2 mmol/L (ref 3.5–5.1)
Sodium: 138 mmol/L (ref 135–145)

## 2021-10-04 LAB — MAGNESIUM: Magnesium: 2.3 mg/dL (ref 1.7–2.4)

## 2021-10-04 MED ORDER — ALBUTEROL SULFATE HFA 108 (90 BASE) MCG/ACT IN AERS: INHALATION_SPRAY | RESPIRATORY_TRACT | 1 refills | Status: DC

## 2021-10-04 MED ORDER — AMOXICILLIN-POT CLAVULANATE 875-125 MG PO TABS
1.0000 | ORAL_TABLET | Freq: Two times a day (BID) | ORAL | Status: DC
  Administered 2021-10-04 – 2021-10-05 (×2): 1 via ORAL
  Filled 2021-10-04 (×2): qty 1

## 2021-10-04 MED ORDER — PREDNISONE 20 MG PO TABS
40.0000 mg | ORAL_TABLET | Freq: Every day | ORAL | Status: DC
  Administered 2021-10-04 – 2021-10-05 (×2): 40 mg via ORAL
  Filled 2021-10-04 (×2): qty 2

## 2021-10-04 NOTE — Progress Notes (Signed)
?Progress Note ? ? ?Patient: GENISIS SONNIER RSW:[id number] DOB: Jan 09, 1941 DOA: 10/02/2021     1 ?DOS: the patient was seen and examined on 10/04/2021 ?  ?Brief hospital course: ?Ms. 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 ? ?Initial vitals in the emergency department showed temperature of 98.1, respiration rate of 18, heart rate 74, blood pressure 105/75, SPO2 of 98% on 4 L nasal cannula, and SPO2 desatted to the 80s with ambulation to the restroom on 4 L nasal cannula. ? ?Serum sodium is 137, potassium 3.8, chloride 100, bicarb 30, BUN of 18, serum creatinine of 0.76, nonfasting blood glucose 113, GFR greater than 74, WBC elevated at 18.8, hemoglobin 12.5, platelets of 198. ? ?ED treatment: None ? ?Assessment and Plan: ?* Multifocal pneumonia ?- Procalcitonin not elevated, query patient not mounting a response ?- Continue with azithromycin and ceftriaxone ? ?Acute respiratory failure with hypoxia ([place]) ?- Currently at baseline 4 L nasal cannula ?- TOC consulted as patient may need to be discharged home with increased oxygenation with ambulation/exertion ? ?Acute pulmonary embolism ([place]) ?- Per CTA ordered on 08/29/2021 with revision on 08/30/2021 stating that patient likely had acute PE in the right upper lobe. ?- Eliquis 5 mg p.o. twice daily has been resumed ? 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 ?- Continue outpatient follow-up with medical oncology ? ?History of  COVID-19 ?- Patient tested positive for COVID-19 and it was reported to patient's PCP in early March 2023 ?- COVID PCR positive on admission ?- Transition IV Solu-Medrol to prednisone 40 mg daily and taper ?- Paxlovid initiated, however contraindicated due to Eliquis; initiated molnupiravir -continue ? ?(HFpEF) heart failure with preserved ejection fraction ([place]) ?- Echo on 08/31/2021 was read as estimated ejection fraction of 60 to 65%, left ventricular diastolic parameters are consistent with grade 1 diastolic dysfunction ? 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 ?OSA (obstructive sleep apnea) ?- CPAP nightly ordered ? ?HLD (hyperlipidemia) ?- Rosuvastatin 5 mg nightly resumed ? ?Anxiety ?Stable, no acute issues ? ? ? ? ?  ? ?Subjective: Patient reports feeling better when seen today.  She has been up ambulating to the restroom on her own and reports breathing has improved.  She is still has dyspnea but not nearly as significant.  She is able to talk in complete sentences with some shortness of breath but previously was only able to speak a few words at a time.  She denies fevers or chills.  No other acute complaints or acute events reported. ? ?Physical Exam: ?Vitals:  ? 10/03/21 1600 10/03/21 2026 10/04/21 0315 10/04/21 0833  ?BP: 117/77 119/76 119/68 95/74  ?Pulse: 82 96 79 72  ?Resp: '20  20 16  '$ ?Temp: 98.7 ?F (37.1 ?C) 97.9 ?F (36.6 ?C) 98.1 ?F (36.7 ?C)  98 ?F (36.7 ?C)  ?TempSrc: Oral Oral Oral   ?SpO2: 92% 99% 94% 100%  ?Weight:      ?Height:      ? ?General exam: awake, alert, no acute distress ?HEENT: Moist mucous membranes, clear conjunctiva, UroFlush normal ?Respiratory system: Lungs generally clear, shallow inspirations, inspirations trigger coughing fits, remains on 4 L/min nasal cannula O2,  conversational dyspnea still present has improved. ?Cardiovascular system: Regular rate and rhythm, no peripheral edema.   ?Central nervous system: A&O x3. no gross focal neurologic deficits, normal speech ?Extremities: moves all, no edema, normal tone ?Skin: dry, intact, normal temperature ?Psychiatry: normal mood, congruent affect, judgement and insight appear normal ? ? ?Data Reviewed: ? ?Notable labs: Glucose 159, WBC 14.3 improved, hemoglobin 11. 8 ? ?Family Communication: None ? ?Disposition: ?Status is: Inpatient ?Remains inpatient appropriate because: Severity of illness remaining on IV therapies as outlined above and on oxygen supplementation beyond that typically needed at baseline. ? ? ? ? Planned Discharge Destination: Home ? ? ? ?Time spent: 35 minutes including time spent at bedside and coordination of care ? ?Author: ?Ezekiel Slocumb, DO ?10/04/2021 1:21 PM ? ?For on call review [email or web address]  ?

## 2021-10-04 NOTE — Plan of Care (Signed)
?  Problem: Activity: Goal: Ability to tolerate increased activity will improve Outcome: Progressing   Problem: Clinical Measurements: Goal: Ability to maintain a body temperature in the normal range will improve Outcome: Progressing   Problem: Respiratory: Goal: Ability to maintain adequate ventilation will improve Outcome: Progressing Goal: Ability to maintain a clear airway will improve Outcome: Progressing   Problem: Education: Goal: Knowledge of risk factors and measures for prevention of condition will improve Outcome: Progressing   Problem: Coping: Goal: Psychosocial and spiritual needs will be supported Outcome: Progressing   Problem: Respiratory: Goal: Will maintain a patent airway Outcome: Progressing Goal: Complications related to the disease process, condition or treatment will be avoided or minimized Outcome: Progressing   Problem: Education: Goal: Knowledge of General Education information will improve Description: Including pain rating scale, medication(s)/side effects and non-pharmacologic comfort measures Outcome: Progressing   Problem: Health Behavior/Discharge Planning: Goal: Ability to manage health-related needs will improve Outcome: Progressing   Problem: Clinical Measurements: Goal: Ability to maintain clinical measurements within normal limits will improve Outcome: Progressing Goal: Will remain free from infection Outcome: Progressing Goal: Diagnostic test results will improve Outcome: Progressing Goal: Respiratory complications will improve Outcome: Progressing Goal: Cardiovascular complication will be avoided Outcome: Progressing   Problem: Activity: Goal: Risk for activity intolerance will decrease Outcome: Progressing   Problem: Nutrition: Goal: Adequate nutrition will be maintained Outcome: Progressing   Problem: Coping: Goal: Level of anxiety will decrease Outcome: Progressing   Problem: Elimination: Goal: Will not experience  complications related to bowel motility Outcome: Progressing Goal: Will not experience complications related to urinary retention Outcome: Progressing   Problem: Pain Managment: Goal: General experience of comfort will improve Outcome: Progressing   Problem: Safety: Goal: Ability to remain free from injury will improve Outcome: Progressing   Problem: Skin Integrity: Goal: Risk for impaired skin integrity will decrease Outcome: Progressing   

## 2021-10-04 NOTE — Telephone Encounter (Signed)
On May 26th appt-cancel rituximab infusion appointment; keep other appointments as planned ? ?Thanks ?GB ?

## 2021-10-04 NOTE — Progress Notes (Signed)
Oxygen Saturation at rest: 92% on room air ?Oxygen Saturation ambulating: 70% on room air  ?Oxygen Saturation ambulating: 94% on 4L O2 via nasal cannula ?

## 2021-10-04 NOTE — Progress Notes (Signed)
Check on the patient this afternoon. currently admitted to hospital for multifocal pneumonia. Plan follow up end the month as planned.

## 2021-10-04 NOTE — Telephone Encounter (Signed)
Spoke to patient still in the hospital.Patient stated that she was feeling a lot better and they had changed her oxygen to 3 1/2 instead of 4. Patient stated that she was going to bathroom  and showering by herself without becoming short winded even with the oxygen. Patient stated that they had sent in generic version of the nebulizer solution already. Patient stated that  the Dr.told her that if she was better still tomorrow that they may discharge her. I told her I would check back in with her on Monday to see how she was and if she was discharged then we would go ahead and schedule follow up appointment here in the office. ?

## 2021-10-04 NOTE — Telephone Encounter (Signed)
Call pt ?She is currently admitted in hospital  ?We are following along and medications will be prescribed from admitting team. Please ask her to let them know nebulizer solution may need to be changed prior to discharge. I would anticipate pulmonology coming to see her too and would want to know advice as well ? ?Please ask to ensure she schedules follow up with me after discharge ?

## 2021-10-04 NOTE — Assessment & Plan Note (Signed)
Stable, no acute issues ?

## 2021-10-05 DIAGNOSIS — J189 Pneumonia, unspecified organism: Secondary | ICD-10-CM | POA: Diagnosis not present

## 2021-10-05 DIAGNOSIS — J9690 Respiratory failure, unspecified, unspecified whether with hypoxia or hypercapnia: Secondary | ICD-10-CM

## 2021-10-05 DIAGNOSIS — I2699 Other pulmonary embolism without acute cor pulmonale: Secondary | ICD-10-CM

## 2021-10-05 HISTORY — DX: Pneumonia, unspecified organism: J18.9

## 2021-10-05 HISTORY — DX: Other pulmonary embolism without acute cor pulmonale: I26.99

## 2021-10-05 HISTORY — DX: Respiratory failure, unspecified, unspecified whether with hypoxia or hypercapnia: J96.90

## 2021-10-05 LAB — CBC
HCT: 36.9 % (ref 36.0–46.0)
Hemoglobin: 11.3 g/dL — ABNORMAL LOW (ref 12.0–15.0)
MCH: 28.6 pg (ref 26.0–34.0)
MCHC: 30.6 g/dL (ref 30.0–36.0)
MCV: 93.4 fL (ref 80.0–100.0)
Platelets: 203 10*3/uL (ref 150–400)
RBC: 3.95 MIL/uL (ref 3.87–5.11)
RDW: 18.9 % — ABNORMAL HIGH (ref 11.5–15.5)
WBC: 10.9 10*3/uL — ABNORMAL HIGH (ref 4.0–10.5)
nRBC: 0 % (ref 0.0–0.2)

## 2021-10-05 MED ORDER — PREDNISONE 10 MG PO TABS: ORAL_TABLET | ORAL | 0 refills | Status: AC

## 2021-10-05 MED ORDER — AMOXICILLIN-POT CLAVULANATE 875-125 MG PO TABS: 1.0000 | ORAL_TABLET | Freq: Two times a day (BID) | ORAL | 0 refills | Status: DC

## 2021-10-05 MED ORDER — MOLNUPIRAVIR EUA 200MG CAPSULE
4.0000 | ORAL_CAPSULE | Freq: Two times a day (BID) | ORAL | 0 refills | Status: AC
Start: 2021-10-05 — End: 2021-10-07

## 2021-10-05 NOTE — Progress Notes (Signed)
Patient was given verbal and written discharge instructions, she states she will comply with instruction. Patient received oxygen tank for home and concentrator will be delivered to the home.Patient  was taken to car by wheelchair and on 2 liters of oxygen nasal cannula, No distress when leaving the floor. ?

## 2021-10-05 NOTE — Progress Notes (Addendum)
CSW contacted [employer] and spoke with jasmine and ordered oxygen for patient. Oxygen will be delivered at bedside ?

## 2021-10-05 NOTE — Discharge Summary (Signed)
Physician Discharge Summary   Patient: Michelle [name] MRN: [id number] DOB: 22-Sep-1940  Admit date:     10/02/2021  Discharge date: 10/05/21  Discharge Physician: Ezekiel Slocumb   PCP: Burnard Hawthorne, FNP   Recommendations at discharge:   Follow-up with primary care in 1 to 2 weeks Follow-up on patient's blood pressure, Coreg and Lasix were held at discharge due to soft BP Follow-up with oncology as scheduled Follow-up with pulmonology Follow-up on oxygen requirements, at time of discharge patient needing 2 L/min.  Portable oxygen concentrator was ordered.   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 [facility] Course: 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  Initial vitals in the emergency department showed temperature of 98.1, respiration rate of 18, heart rate 74, blood pressure 105/75, SPO2 of 98% on 4 L nasal cannula, and SPO2 desatted to the 80s with ambulation to the restroom on 4 L nasal cannula.  Serum sodium is 137, potassium 3.8, chloride 100, bicarb 30, BUN of 18, serum creatinine of 0.76, nonfasting blood glucose 113, GFR greater than 74, WBC elevated at 18.8,  hemoglobin 12.5, platelets of 198.  ED treatment: None  Assessment and Plan: * Multifocal pneumonia - Procalcitonin not elevated, query patient not mounting a response - Treated with empiric azithromycin and ceftriaxone -- Transition to p.o. Augmentin to complete course  Acute respiratory failure with hypoxia (HCC) - Currently at baseline 4 L nasal cannula - TOC consulted as patient may need to be discharged home with increased oxygenation with ambulation/exertion  Acute pulmonary embolism ([place]) - Per CTA ordered on 08/29/2021 with revision on 08/30/2021 stating that patient likely had acute PE in the right upper lobe. - Eliquis 5 mg p.o. 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 - Continue outpatient follow-up with medical oncology  History of COVID-19 - Patient tested positive for COVID-19 and it was reported to patient's PCP in early March 2023 - COVID PCR positive on admission - Transition IV Solu-Medrol to prednisone 40 mg daily and taper - Paxlovid initiated, however contraindicated due to Eliquis; initiated molnupiravir -continue  (HFpEF) heart failure with preserved ejection fraction ([place]) - Echo on 08/31/2021 was read as estimated ejection fraction of 60 to 65%, left ventricular diastolic parameters are consistent with grade 1 diastolic dysfunction  Left ankle swelling - Chronic and unchanged for years - No indications for left  lower extremity ultrasound to assess for DVT at this time given that this is chronic left ankle swelling, gravity dependent and patient is compliant with her home Eliquis  OSA (obstructive sleep apnea) - CPAP nightly ordered  HLD  (hyperlipidemia) - Rosuvastatin 5 mg nightly resumed  Anxiety Stable, no acute issues         Consultants: Oncology Procedures performed:   Disposition: Home Diet recommendation:  Discharge Diet Orders (From admission, onward)     Start     Ordered   10/05/21 0000  Diet - low sodium heart healthy        10/05/21 1307           Cardiac diet DISCHARGE MEDICATION: Allergies as of 10/05/2021   No Known Allergies      Medication List     STOP taking these medications    Calquence 100 MG Tabs Generic drug: Acalabrutinib Maleate   carvedilol 3.125 MG tablet Commonly known as: COREG   cyanocobalamin 2000 MCG tablet   furosemide 20 MG tablet Commonly known as: LASIX   ondansetron 8 MG tablet Commonly known as: ZOFRAN       TAKE these medications    acetaminophen 500 MG tablet Commonly known as: TYLENOL Take 1,000 mg by mouth every 6 (six) hours as needed for mild pain.   albuterol 108 (90 Base) MCG/ACT inhaler Commonly known as: VENTOLIN HFA 2 puffs inhaled orally every 4 to 6 hours as needed, not to exceed 12 puffs in 24 hours   amoxicillin-clavulanate 875-125 MG tablet Commonly known as: Augmentin Take 1 tablet by mouth 2 (two) times daily for 3 days.   apixaban 5 MG Tabs tablet Commonly known as: ELIQUIS Take 1 tablet (5 mg total) by mouth 2 (two) times daily.   benzonatate 100 MG capsule Commonly known as: TESSALON Take 1 capsule (100 mg total) by mouth 2 (two) times daily as needed for cough.   chlorpheniramine-HYDROcodone 10-8 MG/5ML Take 5 mLs by mouth at bedtime as needed for cough.   docusate sodium 100 MG capsule Commonly known as: COLACE Take 100 mg by mouth 2 (two) times daily as needed for mild constipation.   feeding supplement Liqd Take 237 mLs by mouth 3 (three) times daily between meals.   fluticasone 50 MCG/ACT nasal spray Commonly known as: FLONASE PLACE 2 SPRAYS INTO BOTH NOSTRILS DAILY AS NEEDED FOR ALLERGIES.    HAIR SKIN AND NAILS FORMULA PO Take 1 tablet by mouth daily.   Centrum Silver 50+Women Tabs Take 1 tablet by mouth daily.   ipratropium-albuterol 0.5-2.5 (3) MG/3ML Soln Commonly known as: DUONEB Take 3 mLs by nebulization every 6 (six) hours as needed.   MEGARED OMEGA-3 KRILL OIL PO Take 1 tablet by mouth daily.   meloxicam 7.5 MG tablet Commonly known as: MOBIC TAKE 1 TABLET BY MOUTH EVERY DAY AS NEEDED FOR PAIN   molnupiravir EUA 200 mg Caps capsule Commonly known as: LAGEVRIO Take 4 capsules (800 mg total) by mouth 2 (two) times daily for 2 days.   predniSONE 10 MG tablet Commonly known as: DELTASONE Take 4 tablets (40 mg total) by mouth daily with breakfast for 2 days, THEN 3 tablets (30 mg total) daily with breakfast for 2 days, THEN 2 tablets (20 mg total) daily with breakfast for 2 days, THEN 1 tablet (10 mg total) daily with breakfast for 2 days. For 3 days, then decrease 1 tablet each week and finish all the medicine.. Start taking on: Oct 05, 2021 What  changed: See the new instructions.   rosuvastatin 5 MG tablet Commonly known as: CRESTOR TAKE 1 TABLET (5 MG TOTAL) BY MOUTH DAILY.               Durable Medical Equipment  (From admission, onward)           Start     Ordered   10/05/21 1238  For home use only DME oxygen  Once       Question Answer Comment  Length of Need 6 Months   Mode or (Route) Nasal cannula   Liters per Minute 2   Frequency Continuous (stationary and portable oxygen unit needed)   Oxygen delivery system Gas      10/05/21 1237            Discharge Exam: Filed Weights   10/02/21 2112  Weight: 106.1 kg   General exam: awake, alert, no acute distress HEENT: moist mucus membranes, hearing grossly normal  Respiratory system: CTAB, no wheezes, rales or rhonchi, normal respiratory effort. Cardiovascular system: normal S1/S2, RRR   Gastrointestinal system: soft, NT, ND. Central nervous system: A&O x3. no gross focal  neurologic deficits, normal speech Extremities: moves all, no edema, normal tone Skin: dry, intact, normal temperature Psychiatry: normal mood, congruent affect, judgement and insight appear normal   Condition at discharge: stable  The results of significant diagnostics from this hospitalization (including imaging, microbiology, ancillary and laboratory) are listed below for reference.   Imaging Studies: DG Chest 2 View  Result Date: 10/02/2021 CLINICAL DATA:  Pneumonia, cough and shortness of breath. EXAM: CHEST - 2 VIEW COMPARISON:  09/06/2021 and CT chest 09/02/2021, 08/29/2021 and 04/20/2020. PET 07/08/2021. FINDINGS: Trachea is midline. Heart size stable. Hazy patchy ground-glass throughout the lungs with mild coarsening. Lungs are low in volume. Tiny bilateral pleural effusions. Degenerative changes in the shoulders. IMPRESSION: 1. Persistent bilateral hazy airspace opacification, compatible with multilobar pneumonia. 2. Underlying interstitial lung disease, better seen and evaluated on prior CT chest exams. 3. Tiny bilateral pleural effusions. Electronically Signed   By: Lorin Picket M.D.   On: 10/02/2021 10:14   DG Chest Port 1 View  Result Date: 09/06/2021 CLINICAL DATA:  Shortness of breath, hypoxia EXAM: PORTABLE CHEST 1 VIEW COMPARISON:  09/01/2021 FINDINGS: Unchanged low volume AP portable chest radiograph with cardiomegaly and diffuse bilateral heterogeneous and interstitial pulmonary opacity, somewhat more conspicuous in the right upper lobe and left lung base. Severe bilateral glenohumeral arthrosis. IMPRESSION: Unchanged low volume AP portable chest radiograph with cardiomegaly and diffuse bilateral heterogeneous and interstitial pulmonary opacity. Electronically Signed   By: Delanna Ahmadi M.D.   On: 09/06/2021 08:58    Microbiology: Results for orders placed or performed during the hospital encounter of 10/02/21  Resp Panel by RT-PCR (Flu A&B, Covid) Nasopharyngeal Swab      Status: Abnormal   Collection Time: 10/02/21  8:05 PM   Specimen: Nasopharyngeal Swab; Nasopharyngeal(NP) swabs in vial transport medium  Result Value Ref Range Status   SARS Coronavirus 2 by RT PCR POSITIVE (A) NEGATIVE Final    Comment: (NOTE) SARS-CoV-2 target nucleic acids are DETECTED.  The SARS-CoV-2 RNA is generally detectable in upper respiratory specimens during the acute phase of infection. Positive results are indicative of the presence of the identified virus, but do not rule out bacterial infection or co-infection with other pathogens not detected by the test. Clinical correlation with patient history and other diagnostic information is necessary to determine patient infection status. The expected result is Negative.  Fact Sheet for Patients: EntrepreneurPulse.com.au  Fact Sheet for Healthcare Providers: IncredibleEmployment.be  This test is not yet approved or cleared by the [place] FDA and  has been authorized for detection and/or diagnosis of SARS-CoV-2 by FDA under an Emergency Use Authorization (EUA).  This EUA will remain in effect (meaning this test can be used) for the duration of  the COVID-19 declaration under Section 564(b)(1) of the A ct, 21 U.S.C. section 360bbb-3(b)(1), unless the authorization is terminated or revoked sooner.     Influenza A by PCR NEGATIVE NEGATIVE Final   Influenza B by PCR NEGATIVE NEGATIVE Final    Comment: (NOTE) The Xpert Xpress SARS-CoV-2/FLU/RSV plus assay is intended as an aid in the diagnosis of influenza from Nasopharyngeal swab specimens and should not be used as a sole basis for treatment. Nasal washings and aspirates are unacceptable for Xpert Xpress SARS-CoV-2/FLU/RSV testing.  Fact Sheet for Patients: EntrepreneurPulse.com.au  Fact Sheet for Healthcare Providers: IncredibleEmployment.be  This test is not yet approved or cleared by the  [place] FDA and has been authorized for detection and/or diagnosis of SARS-CoV-2 by FDA under an Emergency Use Authorization (EUA). This EUA will remain in effect (meaning this test can be used) for the duration of the COVID-19 declaration under Section 564(b)(1) of the Act, 21 U.S.C. section 360bbb-3(b)(1), unless the authorization is terminated or revoked.  Performed at [facility], [address] [place], [place] [address]     Labs: CBC: Recent Labs  Lab 10/02/21 [phone] 10/03/21 0508 10/04/21 1000 10/05/21 0405  WBC 18.8 Repeated and verified X2.* 12.6* 14.3* 10.9*  NEUTROABS 17.5*  --   --   --   HGB 12.5 11.6* 11.8* 11.3*  HCT 39.1 37.2 39.1 36.9  MCV 91.7 93.5 93.5 93.4  PLT 198.0 201 209 580   Basic Metabolic Panel: Recent Labs  Lab 10/02/21 0948 10/03/21 0508 10/04/21 1000  NA 137 135 138  K 3.8 5.0 4.2  CL 100 102 101  CO2 '30 28 28  '$ GLUCOSE 113* 171* 159*  BUN '18 22 23  '$ CREATININE 0.76 0.65 0.62  CALCIUM 9.0 8.9 9.1  MG  --   --  2.3   Liver Function Tests: Recent Labs  Lab 10/02/21 0948  AST 18  ALT 19  ALKPHOS 51  BILITOT 0.4  PROT 6.3  ALBUMIN 3.7   CBG: No results for input(s): GLUCAP in the last 168 hours.  Discharge time spent: less than 30 minutes.  Signed: Ezekiel Slocumb, DO [facility] 10/05/2021

## 2021-10-05 NOTE — Progress Notes (Signed)
SATURATION QUALIFICATIONS: (This note is used to comply with regulatory documentation for home oxygen) ? ?Patient Saturations on Room Air at Rest = 98% ? ?Patient Saturations on Room Air while Ambulating = 85% ? ?Patient Saturations on 2 Liters of oxygen while Ambulating = 96% ?

## 2021-10-07 ENCOUNTER — Ambulatory Visit
Admission: RE | Admit: 2021-10-07 | Discharge: 2021-10-07 | Disposition: A | Discharge status: Home / Self Care | Payer: Medicare HMO | Source: Ambulatory Visit | Attending: Family | Admitting: Family

## 2021-10-07 ENCOUNTER — Telehealth: Payer: Self-pay

## 2021-10-07 ENCOUNTER — Other Ambulatory Visit: Payer: Self-pay

## 2021-10-07 ENCOUNTER — Telehealth: Payer: Self-pay | Admitting: Family

## 2021-10-07 DIAGNOSIS — M25472 Effusion, left ankle: Secondary | ICD-10-CM | POA: Diagnosis not present

## 2021-10-07 DIAGNOSIS — M7989 Other specified soft tissue disorders: Secondary | ICD-10-CM | POA: Diagnosis not present

## 2021-10-07 NOTE — Telephone Encounter (Signed)
Please call home health and give verbal for PT/OT ? ?I dont see number here- you may have  to look up or call pt to see if she has ? ?Ensure pt schedules HFU appt with me ?

## 2021-10-07 NOTE — Telephone Encounter (Signed)
[place] From [facility] called in requesting resumption orders for PT/OT/Nurse Aide.... [place] stated that she is trying to obtain the information or get verbal orders... Advise [place] that she will need to fax the verbal orders over....  ?

## 2021-10-07 NOTE — Telephone Encounter (Signed)
noted 

## 2021-10-07 NOTE — Progress Notes (Signed)
? ?Synopsis: Referred for organizing pneumonia by Burnard Hawthorne, FNP ? ?Subjective:  ? ?PATIENT ID: Michelle [name] GENDER: female DOB: 1941-02-25, MRN: [id number] ? ?Chief Complaint  ?Patient presents with  ? Hospitalization Follow-up  ?  She c/o cough with white sputum, esp when she exerts herself.   ? ?80yF with history of NICM with recovered EF, diastolic dysfunction, OSA on CPAP followed by Dr. 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 She had covid-19 infetion 08/02/21 and was treated with molnupravir with improvement in symptoms but had residual cough. On 3/17 seen by oncology in clinic and started on course of augmentin for presumed LLL pna.  ?  ?She presented to [facility] for worsening dyspnea and cough and was admitted 08/28/21 and started on vanc/cefepime then CTX/azithro. Procal undetectable 3/30. Found to still test positive for covid. Given covid Ig, long steroid taper, discharged 4/8 on home O2 4L continuous. Looked sick in clinic 5/3 and sent to ED with concern for pna - CXR at this time actually looked improved relative to priors. She completed CAP course and was discharged on 2L O2.  ? ?Has concentrator at home and large tanks. Has been using 3L continuous. DME supplier is [employer].  ? ? ?Otherwise pertinent review of systems is negative. ? ?Past Medical History:  ?Diagnosis Date  ? Arthritis   ? Collagen vascular disease ([place])   ? GERD (gastroesophageal reflux disease)   ? Headache(784.0)   ? HFrEF (heart failure with reduced ejection fraction) ([place])   ? Nonischemic cardiomyopathy, LVEF as low as 30-35% in 08/2019  ? History of methotrexate therapy   ? Hyperlipidemia   ? hx  ? Lymphadenopathy of head and neck 01/2015  ? see on Thyroid ultrasound  ? Lymphoma, mantle cell ([place]) 06/01/2015  ? bx of lymph node in right breast/Stage IV Mantle Cell Lymphoma  ? Personal history of  chemotherapy   ? Rheumatoid arthritis ([place])   ?  ? ?Family History  ?Problem Relation Age of Onset  ? Diabetes Mother   ? Cholelithiasis Mother   ? Hypertension Sister   ? Diabetes Sister   ? Heart murmur Sister   ? Arthritis Brother   ? Breast cancer Neg Hx   ?  ? ?Past Surgical History:  ?Procedure Laterality Date  ? BREAST BIOPSY Left 11/07/2020  ? u/s bx-hydromark #3 "coil"-path pending  ? CARDIAC CATHETERIZATION  09/2004  ? [facility]; EF 60%  ? CARDIAC CATHETERIZATION  08/2004  ? [place]  ? IR FLUORO GUIDED NEEDLE PLC ASPIRATION/INJECTION LOC  06/18/2018  ? PERIPHERAL VASCULAR CATHETERIZATION N/A 07/04/2015  ? Procedure: Porta Cath Insertion;  Surgeon: [name], MD;  Location: [place] [facility];  Service: Cardiovascular;  Laterality: N/A;  ? PORTA CATH REMOVAL N/A 06/23/2018  ? Procedure: PORTA CATH REMOVAL;  Surgeon: [name], MD;  Location: [place] [facility];  Service: Cardiovascular;  Laterality: N/A;  ? RIGHT/LEFT HEART CATH AND CORONARY ANGIOGRAPHY Bilateral 09/20/2019  ? Procedure: RIGHT/LEFT HEART CATH AND CORONARY ANGIOGRAPHY;  Surgeon: Nelva Bush, MD;  Location: [place] [facility];  Service: Cardiovascular;  Laterality: Bilateral;  ? ? ?Social History  ? ?Socioeconomic History  ? Marital status: Married  ?  Spouse name: Not on file  ? Number of children: Not on file  ? Years of education: Not on file  ? Highest education level: Not on file  ?  Occupational History  ? Not on file  ?Tobacco Use  ? Smoking status: Never  ? Smokeless tobacco: Never  ?Vaping Use  ? Vaping Use: Never used  ?Substance and Sexual Activity  ? Alcohol use: No  ? Drug use: No  ? Sexual activity: Never  ?Other Topics Concern  ? Not on file  ?Social History Narrative  ? ** Merged History Encounter **  ?    ? 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  ? ?Social Determinants of Health  ? ?Financial Resource Strain: Low Risk   ? Difficulty of Paying Living Expenses: Not hard at all  ?Food Insecurity: No Food Insecurity  ?  Worried About Charity fundraiser in the Last Year: Never true  ? Ran Out of Food in the Last Year: Never true  ?Transportation Needs: No Transportation Needs  ? Lack of Transportation (Medical): No  ? Lack of Transportation (Non-Medical): No  ?Physical Activity: Insufficiently Active  ? Days of Exercise per Week: 3 days  ? Minutes of Exercise per Session: 20 min  ?Stress: No Stress Concern Present  ? Feeling of Stress : Not at all  ?Social Connections: Unknown  ? Frequency of Communication with Friends and Family: More than three times a week  ? Frequency of Social Gatherings with Friends and Family: Not on file  ? Attends Religious Services: Not on file  ? Active Member of Clubs or Organizations: Not on file  ? Attends Archivist Meetings: Not on file  ? Marital Status: Married  ?Intimate Partner Violence: Not At Risk  ? Fear of Current or Ex-Partner: No  ? Emotionally Abused: No  ? Physically Abused: No  ? Sexually Abused: No  ?  ? ?No Known Allergies  ? ?Outpatient Medications Prior to Visit  ?Medication Sig Dispense Refill  ? acetaminophen (TYLENOL) 500 MG tablet Take 1,000 mg by mouth every 6 (six) hours as needed for mild pain.     ? albuterol (VENTOLIN HFA) 108 (90 Base) MCG/ACT inhaler 2 puffs inhaled orally every 4 to 6 hours as needed, not to exceed 12 puffs in 24 hours 18 each 1  ? apixaban (ELIQUIS) 5 MG TABS tablet Take 1 tablet (5 mg total) by mouth 2 (two) times daily. 60 tablet 2  ? feeding supplement (ENSURE ENLIVE / ENSURE PLUS) LIQD Take 237 mLs by mouth 3 (three) times daily between meals. 237 mL 12  ? fluticasone (FLONASE) 50 MCG/ACT nasal spray PLACE 2 SPRAYS INTO BOTH NOSTRILS DAILY AS NEEDED FOR ALLERGIES. 48 mL 1  ? ipratropium-albuterol (DUONEB) 0.5-2.5 (3) MG/3ML SOLN Take 3 mLs by nebulization every 6 (six) hours as needed. 1080 mL 0  ? MEGARED OMEGA-3 KRILL OIL PO Take 1 tablet by mouth daily.    ? Multiple Vitamins-Minerals (CENTRUM SILVER 50+WOMEN) TABS Take 1 tablet by  mouth daily.    ? predniSONE (DELTASONE) 10 MG tablet Take 4 tablets (40 mg total) by mouth daily with breakfast for 2 days, THEN 3 tablets (30 mg total) daily with breakfast for 2 days, THEN 2 tablets (20 mg total) daily with breakfast for 2 days, THEN 1 tablet (10 mg total) daily with breakfast for 2 days. For 3 days, then decrease 1 tablet each week and finish all the medicine.. 20 tablet 0  ? rosuvastatin (CRESTOR) 5 MG tablet TAKE 1 TABLET (5 MG TOTAL) BY MOUTH DAILY. 90 tablet 3  ? amoxicillin-clavulanate (AUGMENTIN) 875-125 MG tablet Take 1 tablet by mouth 2 (two) times  daily for 3 days. 6 tablet 0  ? benzonatate (TESSALON) 100 MG capsule Take 1 capsule (100 mg total) by mouth 2 (two) times daily as needed for cough. 60 capsule 0  ? chlorpheniramine-HYDROcodone 10-8 MG/5ML Take 5 mLs by mouth at bedtime as needed for cough. 70 mL 0  ? docusate sodium (COLACE) 100 MG capsule Take 100 mg by mouth 2 (two) times daily as needed for mild constipation.    ? meloxicam (MOBIC) 7.5 MG tablet TAKE 1 TABLET BY MOUTH EVERY DAY AS NEEDED FOR PAIN 30 tablet 1  ? Multiple Vitamins-Minerals (HAIR SKIN AND NAILS FORMULA PO) Take 1 tablet by mouth daily.    ? ?Facility-Administered Medications Prior to Visit  ?Medication Dose Route Frequency Provider Last Rate Last Admin  ? heparin lock flush 100 unit/mL  500 Units Intravenous Once Charlaine Dalton R, MD      ? sodium chloride flush (NS) 0.9 % injection 10 mL  10 mL Intravenous Once Charlaine Dalton R, MD      ? sodium chloride flush (NS) 0.9 % injection 10 mL  10 mL Intravenous Once Cammie Sickle, MD      ? ? ? ? ? ?Objective:  ? ?Physical Exam: ? ?General appearance: 81 y.o., female, NAD, conversant  ?Eyes: anicteric sclerae; PERRL, tracking appropriately ?HENT: NCAT; MMM ?Neck: Trachea midline; no lymphadenopathy, no JVD ?Lungs: CTAB, no crackles, no wheeze, with normal respiratory effort ?CV: RRR, no murmur  ?Abdomen: Soft, non-tender; non-distended, BS  present  ?Extremities: No peripheral edema, warm ?Skin: Normal turgor and texture; no rash ?Psych: Appropriate affect ?Neuro: Alert and oriented to person and place, no focal deficit  ? ? ? ?Vitals:  ? 10/08/21 1547

## 2021-10-07 NOTE — Telephone Encounter (Signed)
Patient called to say she was supposed to receive a portable air tank but she hasn't heard from anyone about it yet.  Also, patient states she has just returned home from getting the x-ray on her ankle. ?

## 2021-10-08 ENCOUNTER — Encounter: Payer: Self-pay | Admitting: Student

## 2021-10-08 ENCOUNTER — Telehealth: Payer: Self-pay

## 2021-10-08 ENCOUNTER — Ambulatory Visit: Payer: Medicare HMO | Admitting: Student

## 2021-10-08 VITALS — BP 106/70 | HR 69 | Temp 98.4°F | Ht 67.0 in | Wt 234.0 lb

## 2021-10-08 DIAGNOSIS — J8489 Other specified interstitial pulmonary diseases: Secondary | ICD-10-CM | POA: Diagnosis not present

## 2021-10-08 DIAGNOSIS — J9611 Chronic respiratory failure with hypoxia: Secondary | ICD-10-CM | POA: Diagnosis not present

## 2021-10-08 MED ORDER — PREDNISONE 10 MG PO TABS: ORAL_TABLET | ORAL | 0 refills | Status: DC

## 2021-10-08 NOTE — Telephone Encounter (Signed)
Transition Care Management Unsuccessful Follow-up Telephone Call ? ?Date of discharge and from where:  10/05/21 [facility] ? ?Attempts:  1st Attempt ? ?Reason for unsuccessful TCM follow-up call:  Unable to reach patient. Follow as appropriate.  ? ?  ?

## 2021-10-08 NOTE — Telephone Encounter (Signed)
Called back  this number that was left [phone]) to speak with [place] to give verbal orders for PT/OT but there was no answer and  VM was full therefore I was not able to leave a message? ?

## 2021-10-08 NOTE — Patient Instructions (Addendum)
-   Take prednisone 2 tablets daily for 7 days, then 1 tablet daily until gone (3 weeks remaining of prednisone). Call our clinic if you feel any worse after either decreasing dose of prednisone or stopping it. ?- Would complete the molnupiravir course ?- You will be called to schedule CT Chest. Breathing tests (PFTs) will be on same day as next appointment.  ?- see you in a month! ?

## 2021-10-08 NOTE — Telephone Encounter (Signed)
Patient's great niece, Julianah Marciel, called to say patient was recently released from [facility] and upon discharge was given the medication, Lagevrio, and the paperwork said to take four capsules by mouth two times daily for two days.  Dontasia said the bottle of Lagevrio they picked up from the pharmacy had 40 capsules which will last five days. ? ?Patient would like to know if she is supposed to take the 40 capsules that she picked up from the pharmacy, or the bottle she received upon discharge from the hospital, or both.  Please call patient. ?

## 2021-10-09 NOTE — Telephone Encounter (Signed)
Transition Care Management Follow-up Telephone Call ?Date of discharge and from where: 10/05/21 [facility] ?How have you been since you were released from the hospital? Wheezing, cough with exertion. Oxygen in use, continuous. Denies pain, fever and all other harmful symptoms.  ?Any questions or concerns? No ? ?Items Reviewed: ?Did the pt receive and understand the discharge instructions provided? Yes  ?Medications obtained and verified? Yes  ?Other? Oxygen 2.5  ?Any new allergies since your discharge? No ?Dietary orders reviewed? Yes ?Do you have support at home? Yes , currently staying with Sister. ? ?Home Care and Equipment/Supplies: ?Were home health services ordered? Patient reports she declined services. ? ?Functional Questionnaire: (I = Independent and D = Dependent) ?ADLs:  ? ?Bathing/Dressing- I ? ?Meal Prep- Family assist ? ?Eating- I ? ?Maintaining continence- I ? ?Transferring/Ambulation- walker  ? ?Managing Meds- Sister/niece ? ?Follow up appointments reviewed: ? ?PCP Hospital f/u appt confirmed? Yes  Scheduled to see PCP on 10/21/21 @ 1130. Patient cannot come on 10/14/21 which was offered. Scheduled date falls outside of 7-14 day TCM. If a different day is available within the window patient is willing to reschedule.  ?[facility] f/u appt confirmed? Yes  Seen by Pulmonology 10/08/21.  ?Are transportation arrangements needed? No  ?If their condition worsens, is the pt aware to call PCP or go to the Emergency Dept.? Yes ?Was the patient provided with contact information for the PCP's office or ED? Yes ?Was to pt encouraged to call back with questions or concerns? Yes  ?

## 2021-10-09 NOTE — Telephone Encounter (Signed)
Call pt ? ?Advise that she complete Lagevrio '200mg'$  ( molnupiravir) for COVID 19 , 5 day course is appropriate  ? ? she may complete 5 day course of 4 capsules by mouth BID daily x 5 days ?

## 2021-10-11 NOTE — Telephone Encounter (Signed)
noted 

## 2021-10-14 ENCOUNTER — Inpatient Hospital Stay: Payer: Medicare HMO | Admitting: Family

## 2021-10-14 ENCOUNTER — Other Ambulatory Visit (HOSPITAL_COMMUNITY): Payer: Self-pay

## 2021-10-14 NOTE — Telephone Encounter (Signed)
Spoke to patient and she stated that PT had been out to see her 1 time and had not come back yet. I asked if she had a number to call them to inquire why they had not been back yet but she did not have a number? Patient stated that she nor her sister got a number from them. ?

## 2021-10-14 NOTE — Telephone Encounter (Signed)
Spoke to patient in regards to her portable air tank Patient stated that she had appointment today  to get the portable air tank and the person that she was suppose to see was not there and they had to reschedule her appointment for next week? ?

## 2021-10-14 NOTE — Telephone Encounter (Signed)
Called to speak to [place] to give verbal orders for PT/OT but was unable to leave VM ?

## 2021-10-15 NOTE — Telephone Encounter (Addendum)
Spoke to patient about the medication Lagevrio and explained to her the proper way to take it. Patient verbalized understanding. ?

## 2021-10-16 ENCOUNTER — Telehealth: Payer: Self-pay | Admitting: *Deleted

## 2021-10-16 NOTE — Telephone Encounter (Addendum)
I pushed back her refill call from the pharmacy until after her next office visit.  ?

## 2021-10-16 NOTE — Telephone Encounter (Signed)
Patient called stating that she got a call from [facility] to refill her cancer medicine, but she states that Dr B told her to stop it for a while and she does not know for how long and she is asking we call and let pharmacy know this ?

## 2021-10-19 ENCOUNTER — Other Ambulatory Visit: Payer: Self-pay | Admitting: Internal Medicine

## 2021-10-21 ENCOUNTER — Telehealth: Payer: Self-pay | Admitting: Family

## 2021-10-21 ENCOUNTER — Encounter: Payer: Self-pay | Admitting: Family

## 2021-10-21 ENCOUNTER — Ambulatory Visit (INDEPENDENT_AMBULATORY_CARE_PROVIDER_SITE_OTHER): Payer: Medicare HMO | Admitting: Family

## 2021-10-21 DIAGNOSIS — I5022 Chronic systolic (congestive) heart failure: Secondary | ICD-10-CM

## 2021-10-21 DIAGNOSIS — J9601 Acute respiratory failure with hypoxia: Secondary | ICD-10-CM | POA: Diagnosis not present

## 2021-10-21 DIAGNOSIS — I2699 Other pulmonary embolism without acute cor pulmonale: Secondary | ICD-10-CM

## 2021-10-21 NOTE — Assessment & Plan Note (Addendum)
Patient is well-appearing today.  She is no longer labored in speech.  She is satting at 95% on room air.  She has her oxygen with her however has not required this morning during our  appointment.  Currently on prednisone as prescribed by Dr Verlee Monte. Opted not to recheck WBC as trended down until she has been off of prednisone.  Coordinated care with the [facility] as patient would like oxygen concentrator which is portable.  Patient will continue follow-up pulmonology, will follow

## 2021-10-21 NOTE — Patient Instructions (Addendum)
You cannot take mobic ( anti inflammatory) as you are on eliquis.   However you may tylenol arthritis for joint pain  As discussed, let's start by scheduling Tylenol Arthritis which is a '650mg'$  tablet .   You may take 1-2 tablets every 8 hours ( scheduled) with maximum of 6 tablets per day.   For example , you could take two tablets in the morning ( 8am) and then two tablets again at 4pm.   Maximum daily dose of acetaminophen 4 g per day from all sources.  If you are taking another medication which includes acetaminophen (Tylenol) which may be in cough and cold preparations or pain medication such as Percocet, you will need to factor that into your total daily dose to be safe.  Please let me know if any questions   Nice to see you!

## 2021-10-21 NOTE — Progress Notes (Signed)
Subjective:    Patient ID: Michelle [name], female    DOB: February 17, 1941, 81 y.o.   MRN: [id number]  CC: JERICHA BRYDEN is a 81 y.o. female who presents today for follow up.   HPI: Accompanied by sister.  She feels 'much better' DOE has improved. No longer SOB when talking.  Occasional productive cough. Not wearing O2 in the office but has it with her She has been eating a lot while on prednisone.  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 Follow up 11/19/21 Dr Verlee Monte  She is compliant with continuous 2L [place], prednisone taper.  She has portable oxygen concentrator Provoked PE- compliant with eliquis '5mg'$  BID Previously had used meloxicam as needed for joint pain Spironolactone increased to 25 mg to 15 2023 by Dr. Saunders Revel   10/02/2021 and discharged 10/05/2021 for multifocal pneumonia.  Treated with empiric azithromycin, ceftriaxone.  Transitioned to p.o. Augmentin.  COVID-positive on admission transition IV Solu-Medrol to prednisone '40mg'$ .  Initiated molnupiravir. WBC trending down 18.8--> 10.9 on 10/05/21 Coreg and Lasix were held at discharge due to soft blood pressure. 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    Personal history of chemotherapy    Rheumatoid arthritis [facility])    Past Surgical History:  Procedure Laterality Date   BREAST BIOPSY Left 11/07/2020   u/s bx-hydromark #3 "coil"-path pending   CARDIAC CATHETERIZATION  09/2004   [facility]; EF 60%   CARDIAC CATHETERIZATION  08/2004   [facility]   IR FLUORO GUIDED NEEDLE PLC ASPIRATION/INJECTION LOC  06/18/2018   PERIPHERAL VASCULAR CATHETERIZATION N/A 07/04/2015   Procedure: [name] Insertion;  Surgeon: [name], MD;  Location: [place] CV [facility];  Service: Cardiovascular;  Laterality: N/A;   PORTA CATH REMOVAL N/A 06/23/2018   Procedure: PORTA CATH REMOVAL;  Surgeon: [name], MD;  Location: [place] [facility];  Service: Cardiovascular;  Laterality: N/A;   RIGHT/LEFT HEART CATH AND CORONARY ANGIOGRAPHY Bilateral 09/20/2019   Procedure: RIGHT/LEFT HEART CATH AND CORONARY ANGIOGRAPHY;  Surgeon: Nelva Bush, MD;  Location: [place] [facility];  Service: Cardiovascular;  Laterality: Bilateral;   Family History  Problem Relation Age of Onset   Diabetes Mother    Cholelithiasis Mother    Hypertension Sister    Diabetes Sister    Heart murmur Sister    Arthritis Brother    Breast cancer Neg Hx     Allergies: Patient has no known allergies. Current Outpatient Medications on File Prior to Visit  Medication Sig Dispense Refill   acetaminophen (TYLENOL) 500 MG tablet Take 1,000 mg by mouth every 6 (six) hours as needed for mild pain.      albuterol (VENTOLIN HFA) 108 (90 Base)  MCG/ACT inhaler 2 puffs inhaled orally every 4 to 6 hours as needed, not to exceed 12 puffs in 24 hours 18 each 1   apixaban (ELIQUIS) 5 MG TABS tablet Take 1 tablet (5 mg total) by mouth 2 (two) times daily. 60 tablet 2   feeding supplement (ENSURE ENLIVE / ENSURE PLUS) LIQD Take 237 mLs by mouth 3 (three) times daily between meals. 237 mL 12   fluticasone (FLONASE) 50 MCG/ACT nasal spray PLACE 2 SPRAYS INTO BOTH NOSTRILS DAILY AS NEEDED FOR ALLERGIES. 48 mL 1    ipratropium-albuterol (DUONEB) 0.5-2.5 (3) MG/3ML SOLN Take 3 mLs by nebulization every 6 (six) hours as needed. 1080 mL 0   MEGARED OMEGA-3 KRILL OIL PO Take 1 tablet by mouth daily.     Multiple Vitamins-Minerals (CENTRUM SILVER 50+WOMEN) TABS Take 1 tablet by mouth daily.     predniSONE (DELTASONE) 10 MG tablet Take two tablets daily for 1 week, then 10 tablets daily until gone 28 tablet 0   rosuvastatin (CRESTOR) 5 MG tablet TAKE 1 TABLET (5 MG TOTAL) BY MOUTH DAILY. 90 tablet 3   Current Facility-Administered Medications on File Prior to Visit  Medication Dose Route Frequency Provider Last Rate Last Admin   heparin lock flush 100 unit/mL  500 Units Intravenous Once Charlaine Dalton R, MD       sodium chloride flush (NS) 0.9 % injection 10 mL  10 mL Intravenous Once Charlaine Dalton R, MD       sodium chloride flush (NS) 0.9 % injection 10 mL  10 mL Intravenous Once Cammie Sickle, MD        Social History   Tobacco Use   Smoking status: Never   Smokeless tobacco: Never  Vaping Use   Vaping Use: Never used  Substance Use Topics   Alcohol use: No   Drug use: No    Review of Systems  Constitutional:  Negative for chills and fever.  HENT:  Positive for congestion.   Respiratory:  Positive for cough and shortness of breath (with long distances).   Cardiovascular:  Negative for chest pain, palpitations and leg swelling.  Gastrointestinal:  Negative for nausea and vomiting.     Objective:    BP 118/62 (BP Location: Left Arm, Patient Position: Sitting, Cuff Size: Large)   Pulse 92   Temp 98.6 F (37 C) (Oral)   Ht '5\' 7"'$  (1.702 m)   Wt 243 lb 3.2 oz (110.3 kg)   SpO2 95%   BMI 38.09 kg/m  BP Readings from Last 3 Encounters:  10/21/21 118/62  10/08/21 106/70  10/05/21 106/73   Wt Readings from Last 3 Encounters:  10/21/21 243 lb 3.2 oz (110.3 kg)  10/08/21 234 lb (106.1 kg)  10/02/21 234 lb (106.1 kg)    Physical Exam Vitals reviewed.  Constitutional:       Appearance: She is well-developed.  Eyes:     Conjunctiva/sclera: Conjunctivae normal.  Cardiovascular:     Rate and Rhythm: Normal rate and regular rhythm.     Pulses: Normal pulses.     Heart sounds: Normal heart sounds.  Pulmonary:     Effort: Pulmonary effort is normal.     Breath sounds: Normal breath sounds. No wheezing, rhonchi or rales.  Musculoskeletal:     Right lower leg: No edema.     Left lower leg: No edema.  Skin:    General: Skin is warm and dry.  Neurological:     Mental Status: She is alert.  Psychiatric:        Speech: Speech normal.        Behavior: Behavior normal.        Thought Content: Thought content normal.       Assessment & Plan:   Problem List Items Addressed This Visit       Cardiovascular and Mediastinum   Acute pulmonary embolism (HCC)    Chronic, symptomatically stable.  She will continue Eliquis 5 mg twice daily. Counseled her on avoidance of NSAIDs while she is on Eliquis.  Advised for Pain she may use Tylenol arthritis.   meloxicam is discontinued from her chart       Chronic HFrEF (heart failure with reduced ejection fraction) ([place])    Patient has gained weight however we jointly agreed more likely due to prednisone use.  No increased shortness of breath or leg swelling.  She is no longer on spironolactone, Lasix or carvedilol due to lower blood pressures during hospitalization. We will Dr. Darnelle Bos office to schedule sooner follow-up to further discuss initiation of medications again.          Respiratory   Acute respiratory failure with hypoxia [facility]    Patient is well-appearing today.  She is no longer labored in speech.  She is satting at 95% on room air.  She has her oxygen with her however has not required this morning during our  appointment.  Currently on prednisone as prescribed by Dr Verlee Monte. Opted not to recheck WBC as trended down until she has been off of prednisone.  Coordinated care with the [facility] as  patient would like oxygen concentrator which is portable.  Patient will continue follow-up pulmonology, will follow          I am having [name] maintain her acetaminophen, MEGARED OMEGA-3 KRILL OIL PO, Centrum Silver 50+Women, fluticasone, rosuvastatin, apixaban, feeding supplement, ipratropium-albuterol, albuterol, and predniSONE.   No orders of the defined types were placed in this encounter.   Return precautions given.   Risks, benefits, and alternatives of the medications and treatment plan prescribed today were discussed, and patient expressed understanding.   Education regarding symptom management and diagnosis given to patient on AVS.  Continue to follow with Burnard [place], FNP for routine health maintenance.   [name] and I agreed with plan.   Mable Paris, FNP

## 2021-10-21 NOTE — Telephone Encounter (Signed)
Pt gets oxygent [facility] per social worker notes  She needs another appt so she can qualify for oxygen concentrator  Please call [facility] to schedule

## 2021-10-21 NOTE — Assessment & Plan Note (Signed)
Chronic, symptomatically stable.  She will continue Eliquis 5 mg twice daily. Counseled her on avoidance of NSAIDs while she is on Eliquis.  Advised for Pain she may use Tylenol arthritis.   meloxicam is discontinued from her chart

## 2021-10-21 NOTE — Telephone Encounter (Signed)
close

## 2021-10-21 NOTE — Telephone Encounter (Signed)
See prior note Phone number for [facility] below  [phone]

## 2021-10-21 NOTE — Telephone Encounter (Signed)
Call cardiology dr end office  Pt needs follow up  she is no longer on spironolactone, Lasix or carvedilol after hospitalization and soft blood pressure.    Please make appointment to discuss medication management sooner than august 2023 as scheduled

## 2021-10-21 NOTE — Assessment & Plan Note (Addendum)
Patient has gained weight however we jointly agreed more likely due to prednisone use.  No increased shortness of breath or leg swelling.  She is no longer on spironolactone, Lasix or carvedilol due to lower blood pressures during hospitalization. We will Dr. Darnelle Bos office to schedule sooner follow-up to further discuss initiation of medications again.

## 2021-10-21 NOTE — Telephone Encounter (Signed)
I called [facility] & was transferred to [place] [facility] where patient gets her O2 supplies. Pt had a walk test done on 5/17 & did not pass this test. That is why she was advised to stay on the bigger tanks & not the ML6 tanks that only provide oxygen when breathing in.She said that respiratory therapist did not recommend the smaller tank due to this & she can be revaluated in the future but Daria was unsure of the time in between checks.

## 2021-10-22 ENCOUNTER — Encounter: Payer: Self-pay | Admitting: Internal Medicine

## 2021-10-23 ENCOUNTER — Ambulatory Visit
Admission: RE | Admit: 2021-10-23 | Discharge: 2021-10-23 | Disposition: A | Discharge status: Home / Self Care | Payer: Medicare HMO | Source: Ambulatory Visit | Attending: Diagnostic Radiology | Admitting: Diagnostic Radiology

## 2021-10-23 DIAGNOSIS — I517 Cardiomegaly: Secondary | ICD-10-CM | POA: Insufficient documentation

## 2021-10-23 DIAGNOSIS — J8489 Other specified interstitial pulmonary diseases: Secondary | ICD-10-CM | POA: Diagnosis not present

## 2021-10-23 DIAGNOSIS — J479 Bronchiectasis, uncomplicated: Secondary | ICD-10-CM | POA: Diagnosis not present

## 2021-10-23 DIAGNOSIS — Z8701 Personal history of pneumonia (recurrent): Secondary | ICD-10-CM | POA: Diagnosis not present

## 2021-10-23 DIAGNOSIS — Q2579 Other congenital malformations of pulmonary artery: Secondary | ICD-10-CM | POA: Insufficient documentation

## 2021-10-23 DIAGNOSIS — J84112 Idiopathic pulmonary fibrosis: Secondary | ICD-10-CM | POA: Insufficient documentation

## 2021-10-23 DIAGNOSIS — I7789 Other specified disorders of arteries and arterioles: Secondary | ICD-10-CM | POA: Diagnosis not present

## 2021-10-23 DIAGNOSIS — I700 Atherosclerosis of aorta: Secondary | ICD-10-CM | POA: Diagnosis not present

## 2021-10-23 NOTE — Telephone Encounter (Signed)
noted 

## 2021-10-23 NOTE — Telephone Encounter (Signed)
Patient was seen.  Nymir Ringler,cma

## 2021-10-24 ENCOUNTER — Other Ambulatory Visit (HOSPITAL_COMMUNITY): Payer: Self-pay

## 2021-10-24 ENCOUNTER — Other Ambulatory Visit: Payer: Self-pay

## 2021-10-24 DIAGNOSIS — C8311 Mantle cell lymphoma, lymph nodes of head, face, and neck: Secondary | ICD-10-CM

## 2021-10-25 ENCOUNTER — Encounter: Payer: Self-pay | Admitting: Internal Medicine

## 2021-10-25 ENCOUNTER — Inpatient Hospital Stay (HOSPITAL_BASED_OUTPATIENT_CLINIC_OR_DEPARTMENT_OTHER): Payer: Medicare HMO | Admitting: Internal Medicine

## 2021-10-25 ENCOUNTER — Inpatient Hospital Stay: Payer: Medicare HMO | Attending: Internal Medicine

## 2021-10-25 ENCOUNTER — Other Ambulatory Visit (HOSPITAL_COMMUNITY): Payer: Self-pay

## 2021-10-25 ENCOUNTER — Ambulatory Visit: Payer: Medicare HMO

## 2021-10-25 VITALS — BP 118/76 | HR 69 | Temp 96.3°F | Resp 18 | Wt 248.9 lb

## 2021-10-25 DIAGNOSIS — C8311 Mantle cell lymphoma, lymph nodes of head, face, and neck: Secondary | ICD-10-CM | POA: Diagnosis not present

## 2021-10-25 DIAGNOSIS — J9610 Chronic respiratory failure, unspecified whether with hypoxia or hypercapnia: Secondary | ICD-10-CM | POA: Diagnosis not present

## 2021-10-25 DIAGNOSIS — R0602 Shortness of breath: Secondary | ICD-10-CM | POA: Insufficient documentation

## 2021-10-25 DIAGNOSIS — I272 Pulmonary hypertension, unspecified: Secondary | ICD-10-CM | POA: Insufficient documentation

## 2021-10-25 DIAGNOSIS — M25512 Pain in left shoulder: Secondary | ICD-10-CM | POA: Diagnosis not present

## 2021-10-25 DIAGNOSIS — Z79899 Other long term (current) drug therapy: Secondary | ICD-10-CM | POA: Insufficient documentation

## 2021-10-25 LAB — CBC WITH DIFFERENTIAL/PLATELET
Abs Immature Granulocytes: 0.11 10*3/uL — ABNORMAL HIGH (ref 0.00–0.07)
Basophils Absolute: 0 10*3/uL (ref 0.0–0.1)
Basophils Relative: 0 %
Eosinophils Absolute: 0.1 10*3/uL (ref 0.0–0.5)
Eosinophils Relative: 1 %
HCT: 40.1 % (ref 36.0–46.0)
Hemoglobin: 12.6 g/dL (ref 12.0–15.0)
Immature Granulocytes: 1 %
Lymphocytes Relative: 4 %
Lymphs Abs: 0.5 10*3/uL — ABNORMAL LOW (ref 0.7–4.0)
MCH: 30 pg (ref 26.0–34.0)
MCHC: 31.4 g/dL (ref 30.0–36.0)
MCV: 95.5 fL (ref 80.0–100.0)
Monocytes Absolute: 0.4 10*3/uL (ref 0.1–1.0)
Monocytes Relative: 3 %
Neutro Abs: 11.7 10*3/uL — ABNORMAL HIGH (ref 1.7–7.7)
Neutrophils Relative %: 91 %
Platelets: 190 10*3/uL (ref 150–400)
RBC: 4.2 MIL/uL (ref 3.87–5.11)
RDW: 18.3 % — ABNORMAL HIGH (ref 11.5–15.5)
WBC: 12.9 10*3/uL — ABNORMAL HIGH (ref 4.0–10.5)
nRBC: 0 % (ref 0.0–0.2)

## 2021-10-25 LAB — COMPREHENSIVE METABOLIC PANEL
ALT: 19 U/L (ref 0–44)
AST: 20 U/L (ref 15–41)
Albumin: 3.2 g/dL — ABNORMAL LOW (ref 3.5–5.0)
Alkaline Phosphatase: 43 U/L (ref 38–126)
Anion gap: 6 (ref 5–15)
BUN: 16 mg/dL (ref 8–23)
CO2: 31 mmol/L (ref 22–32)
Calcium: 8.8 mg/dL — ABNORMAL LOW (ref 8.9–10.3)
Chloride: 102 mmol/L (ref 98–111)
Creatinine, Ser: 0.74 mg/dL (ref 0.44–1.00)
GFR, Estimated: >60 mL/min (ref >60)
Glucose, Bld: 108 mg/dL — ABNORMAL HIGH (ref 70–99)
Potassium: 4.2 mmol/L (ref 3.5–5.1)
Sodium: 139 mmol/L (ref 135–145)
Total Bilirubin: 0.6 mg/dL (ref 0.3–1.2)
Total Protein: 6.1 g/dL — ABNORMAL LOW (ref 6.5–8.1)

## 2021-10-25 LAB — BRAIN NATRIURETIC PEPTIDE: B Natriuretic Peptide: 54.7 pg/mL (ref 0.0–100.0)

## 2021-10-25 LAB — LACTATE DEHYDROGENASE: LDH: 208 U/L — ABNORMAL HIGH (ref 98–192)

## 2021-10-25 NOTE — Progress Notes (Unsigned)
Patient denies new problems/concerns today.   °

## 2021-10-25 NOTE — Progress Notes (Signed)
Michelle [name] OFFICE PROGRESS NOTE  Patient Care Team: Michelle Hawthorne, FNP as PCP - General (Family Medicine) End, Harrell Gave, MD as PCP - Cardiology (Cardiology) Michelle Sprang, MD as PCP - Electrophysiology (Cardiology) Michelle Confer, MD (Internal Medicine) Michelle Sickle, MD as Consulting Physician (Internal Medicine)   Cancer Staging  No matching staging information was found for the patient.   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 Feb 9th- START Benda-Ritux with neulasta; Prolonged neutropenia; DISCONT- Benda-Ritux;   # April 13 th 2017- START R-CHOP x1; severe/prolonged neutropenia; PET- CR; BMBx-Neg; Disc R-CHOP # June 2022- DIAGNOSIS: thickened cortex of 12 mm. An additional lymph node demonstrates a thickened cortex of 7 mm. A third lymph node demonstrates a mildly thickened cortex of 4 mm A. LYMPH NODE, LEFT AXILLA; ULTRASOUND-GUIDED BIOPSY:  - CD5+ MONOCLONAL B-CELL POPULATION; COMPATIBLE WITH INVOLVEMENT BY THE  PATIENT'S KNOWN MANTLE CELL LYMPHOMA.   #July 2022-second week-started ibrutinib 420 mg a dayx 1 week-stop because of severe rash.  Significant clinical response noted.  # # 26th MAY 2017- Start Rituxan q 79M Main OCT 12th 2017- PET NED.    # AUG 1st, 2022- start acalbrutinib. 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   Michelle [name] 81 y.o.  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   In the interim patient was admitted to hospital for multifocal pneumonia twice.  Patient s/p recent evaluation with pulmonary in [place].  Currently on 2 lit/min. S/p Abx; on prednisone taper.  Otherwise no worsening constipation or shortness of breath or cough. No swelling in legs.   Review of Systems  Constitutional:  Negative for chills, diaphoresis, fever, malaise/fatigue and weight loss.  HENT:  Negative for nosebleeds and sore throat.   Eyes:  Negative for double vision.  Respiratory:  Negative for hemoptysis, sputum production and wheezing.   Cardiovascular:  Negative for chest pain, palpitations, orthopnea and leg swelling.  Gastrointestinal:  Positive for constipation. Negative for abdominal pain, blood in stool, diarrhea, heartburn, melena, nausea and vomiting.  Genitourinary:  Negative for dysuria, frequency and urgency.  Musculoskeletal:  Positive for back pain and joint pain.  Skin: Negative.  Negative for itching and rash.  Neurological:  Negative for dizziness, tingling, focal weakness, weakness and headaches.  Endo/Heme/Allergies:  Does not bruise/bleed easily.  Psychiatric/Behavioral:  Negative for depression. The patient is not nervous/anxious and does not have insomnia.    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% in 08/2019   History of methotrexate therapy    Hyperlipidemia    hx   Lymphadenopathy of head and neck 01/2015   see on Thyroid ultrasound   Lymphoma, mantle  cell ([place]) 06/01/2015   bx of lymph node in right breast/Stage IV Mantle Cell Lymphoma   Personal history of chemotherapy    Rheumatoid arthritis ([place])     PAST SURGICAL HISTORY :   Past Surgical History:  Procedure Laterality Date   BREAST BIOPSY Left 11/07/2020   u/s bx-hydromark #3 "coil"-path pending   CARDIAC CATHETERIZATION  09/2004   [facility]; EF 60%   CARDIAC CATHETERIZATION  08/2004   [facility]   IR FLUORO GUIDED NEEDLE PLC ASPIRATION/INJECTION LOC  06/18/2018   PERIPHERAL VASCULAR CATHETERIZATION N/A 07/04/2015   Procedure: [name] Insertion;  Surgeon: Michelle Huxley, MD;  Location: [facility];  Service: Cardiovascular;  Laterality: N/A;   PORTA CATH REMOVAL N/A 06/23/2018   Procedure: PORTA CATH REMOVAL;  Surgeon: Michelle Huxley, MD;  Location: [facility];  Service: Cardiovascular;  Laterality: N/A;   RIGHT/LEFT HEART CATH AND CORONARY ANGIOGRAPHY Bilateral 09/20/2019   Procedure: RIGHT/LEFT HEART CATH AND CORONARY ANGIOGRAPHY;  Surgeon: Nelva Bush, MD;  Location: [facility];  Service: Cardiovascular;  Laterality: Bilateral;    FAMILY HISTORY :   Family History  Problem Relation Age of Onset   Diabetes Mother    Cholelithiasis Mother    Hypertension Sister    Diabetes Sister    Heart murmur Sister    Arthritis Brother    Breast cancer Neg Hx     SOCIAL HISTORY:   Social History   Tobacco Use   Smoking status: Never   Smokeless tobacco: Never  Vaping Use   Vaping Use: Never used  Substance Use Topics   Alcohol use: No   Drug use: No    ALLERGIES:  has No Known Allergies.  MEDICATIONS:  Current Outpatient Medications  Medication Sig Dispense Refill   acetaminophen (TYLENOL) 500 MG tablet Take 1,000 mg by mouth every 6 (six) hours as needed for mild pain.      albuterol (VENTOLIN HFA) 108 (90 Base) MCG/ACT inhaler 2 puffs inhaled orally every 4 to 6 hours as needed, not to exceed 12 puffs in 24 hours 18 each 1   apixaban (ELIQUIS) 5 MG  TABS tablet Take 1 tablet (5 mg total) by mouth 2 (two) times daily. 60 tablet 2   feeding supplement (ENSURE ENLIVE / ENSURE PLUS) LIQD Take 237 mLs by mouth 3 (three) times daily between meals. 237 mL 12   fluticasone (FLONASE) 50 MCG/ACT nasal spray PLACE 2 SPRAYS INTO BOTH NOSTRILS DAILY AS NEEDED FOR ALLERGIES. 48 mL 1   ipratropium-albuterol (DUONEB) 0.5-2.5 (3) MG/3ML SOLN Take 3 mLs by nebulization every 6 (six) hours as needed. 1080 mL 0   MEGARED OMEGA-3 KRILL OIL PO Take 1 tablet by mouth daily.     meloxicam (MOBIC) 7.5 MG tablet TAKE 1 TABLET BY MOUTH EVERY DAY AS NEEDED FOR PAIN 30 tablet 1   Multiple Vitamins-Minerals (CENTRUM SILVER 50+WOMEN) TABS Take 1 tablet by mouth daily.     predniSONE (DELTASONE) 10 MG tablet Take two tablets daily for 1 week, then 10 tablets daily until gone 28 tablet 0   rosuvastatin (CRESTOR) 5 MG tablet TAKE 1 TABLET (5 MG TOTAL) BY MOUTH DAILY. 90 tablet 3   No current facility-administered medications for this visit.  Facility-Administered Medications Ordered in Other Visits  Medication Dose Route Frequency Provider Last Rate Last Admin   heparin lock flush 100 unit/mL  500 Units Intravenous Once Charlaine Dalton R, MD       sodium chloride flush (NS) 0.9 % injection 10 mL  10 mL Intravenous Once Charlaine Dalton R, MD       sodium chloride flush (NS) 0.9 % injection 10 mL  10 mL Intravenous Once Michelle Sickle, MD        PHYSICAL EXAMINATION: ECOG PERFORMANCE STATUS: 0 - Asymptomatic  BP 118/76   Pulse 69   Temp (!) 96.3 F (35.7 C)   Resp 18   Wt 248 lb 14.4 oz (112.9 kg)   SpO2 100%   BMI 38.98 kg/m   Filed Weights   10/25/21 0900  Weight: 248 lb 14.4 oz (112.9 kg)    Physical Exam HENT:     Head: Normocephalic and atraumatic.     Mouth/Throat:     Pharynx: No oropharyngeal exudate.  Eyes:     Pupils: Pupils are equal, round, and reactive to light.  Cardiovascular:     Rate and Rhythm: Normal rate and regular  rhythm.  Pulmonary:     Effort: No respiratory distress.     Breath sounds: No wheezing.  Abdominal:     General: Bowel sounds are normal. There is no distension.     Palpations: Abdomen is soft. There is no mass.     Tenderness: There is no abdominal tenderness. There is no guarding or rebound.  Musculoskeletal:        General: No tenderness. Normal range of motion.     Cervical back: Normal range of motion and neck supple.  Skin:    General: Skin is warm.  Neurological:     Mental Status: She is alert and oriented to person, place, and time.  Psychiatric:        Mood and Affect: Affect normal.   LABORATORY DATA:  I have reviewed the data as listed    Component Value Date/Time   NA 139 10/25/2021 0900   NA 145 (H) 09/08/2019 1359   NA 142 09/14/2013 1127   K 4.2 10/25/2021 0900   K 3.5 09/14/2013 1127   CL 102 10/25/2021 0900   CL 110 (H) 09/14/2013 1127   CO2 31 10/25/2021 0900   CO2 29 09/14/2013 1127   GLUCOSE 108 (H) 10/25/2021 0900   GLUCOSE 106 (H) 09/14/2013 1127   BUN 16 10/25/2021 0900   BUN 15 09/08/2019 1359   BUN 8 09/14/2013 1127   CREATININE 0.74 10/25/2021 0900   CREATININE 0.71 09/14/2013 1127   CREATININE 0.68 04/01/2013 1550   CALCIUM 8.8 (L) 10/25/2021 0900   CALCIUM 8.6 09/14/2013 1127   PROT 6.1 (L) 10/25/2021 0900   PROT 7.6 09/14/2013 1127   ALBUMIN 3.2 (L) 10/25/2021 0900   ALBUMIN 3.2 (L) 09/14/2013 1127   AST 20 10/25/2021 0900   AST 22 09/14/2013 1127   ALT 19 10/25/2021 0900   ALT 19 09/14/2013 1127   ALKPHOS 43 10/25/2021 0900   ALKPHOS 76 09/14/2013 1127   BILITOT 0.6 10/25/2021 0900   BILITOT 0.4 09/14/2013 1127   GFRNONAA >60 10/25/2021 0900   GFRNONAA >60 09/14/2013 1127   GFRAA >60 01/20/2020 1303   GFRAA >60 09/14/2013 1127    No results found for: SPEP, UPEP  Lab Results  Component Value Date   WBC 12.9 (H) 10/25/2021   NEUTROABS 11.7 (H) 10/25/2021  HGB 12.6 10/25/2021   HCT 40.1 10/25/2021   MCV 95.5 10/25/2021    PLT 190 10/25/2021      Chemistry      Component Value Date/Time   NA 139 10/25/2021 0900   NA 145 (H) 09/08/2019 1359   NA 142 09/14/2013 1127   K 4.2 10/25/2021 0900   K 3.5 09/14/2013 1127   CL 102 10/25/2021 0900   CL 110 (H) 09/14/2013 1127   CO2 31 10/25/2021 0900   CO2 29 09/14/2013 1127   BUN 16 10/25/2021 0900   BUN 15 09/08/2019 1359   BUN 8 09/14/2013 1127   CREATININE 0.74 10/25/2021 0900   CREATININE 0.71 09/14/2013 1127   CREATININE 0.68 04/01/2013 1550      Component Value Date/Time   CALCIUM 8.8 (L) 10/25/2021 0900   CALCIUM 8.6 09/14/2013 1127   ALKPHOS 43 10/25/2021 0900   ALKPHOS 76 09/14/2013 1127   AST 20 10/25/2021 0900   AST 22 09/14/2013 1127   ALT 19 10/25/2021 0900   ALT 19 09/14/2013 1127   BILITOT 0.6 10/25/2021 0900   BILITOT 0.4 09/14/2013 1127       RADIOGRAPHIC STUDIES: I have personally reviewed the radiological images as listed and agreed with the findings in the report. No results found.   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  # Currently on Calquence and maintenance rituximab-s/p 2 monthly rituximab infusions.  PET scan February 2023-complete response;  No evidence non-Hodgkin's lymphoma/mantle cell recurrence or progression   # RE-StartCalequence 100 mg twice a day [ AUG 1st, 2022].  DISContinue Rituximab-given the multifocal pneumonia [see below]  #Multifocal pneumonia-[April 2023]-s/p antibiotics improved.  Recent CT scan MAY 2023- [[place]ul, [place]]-improved.  Given the multiple pneumonias/high risk of death-recommended continuation of further rituximab. April 2023- IgG- 652.   # NICMP- [35-40%%- July 28th 2021]--  EVAL; NO CRT [Dr.End/Dr.Klein]-on  coreg/spirinilactone; July 2022- 2D echo 40 to 45% ejection fraction- STABLE;; continue lasix 20 mg/day [sec to dizzy spells]-   #Chronic respiratory failure -2 L home O2 -NICMP/pulmonary hypertension/chronic bilateral lung scarring-followed by pulmonary [[place]]-STABLE   # Vaccination: recommend Pneumonia shot if not given in last 5 years. Pt to check with PCP.   # Left shoulder pain/ Chronic arthritis-on meloxicam as needed- STABLE.   # DISPOSITION: fridays pt pref-  # follow up-MD in 4 weeks [ MD ;labs- cbc/cmp/ldh/BNP]- ;.Dr.B  # I reviewed the blood work- with the patient in detail; also reviewed the imaging independently [as summarized above]; and with the patient in detail.              Orders Placed This Encounter  Procedures   CBC with Differential/Platelet    Standing Status:   Future    Standing Expiration Date:   10/26/2022   Comprehensive metabolic panel    Standing Status:   Future    Standing Expiration Date:   10/26/2022   Lactate dehydrogenase    Standing Status:   Future    Standing Expiration Date:   10/26/2022   Brain natriuretic peptide    Standing Status:   Future    Standing Expiration Date:   10/26/2022     All questions were answered. The patient knows to call the clinic with any problems, questions or concerns.  Michelle Sickle, MD 10/26/2021 8:02 PM

## 2021-10-25 NOTE — Assessment & Plan Note (Signed)
#  Mantle cell lymphoma recurrent biopsy-proven [July 2022]; July, 2022 PET scan shows  Several hypermetabolic left axillary and subpectoral lymph nodes consistent with recurrent lymphoma; hypermetabolic subcutaneous mass.  # Currently on Calquence and maintenance rituximab-s/p 2 monthly rituximab infusions.  PET scan February 2023-complete response;  No evidence non-Hodgkin's lymphoma/mantle cell recurrence or progression  # RE-StartCalequence 100 mg twice a day [ AUG 1st, 2022].  DISContinue Rituximab-given the multifocal pneumonia [see below]  #Multifocal pneumonia-[April 2023]-s/p antibiotics improved.  Recent CT scan MAY 2023- [pul, [place]]-improved.  Given the multiple pneumonias/high risk of death-recommended continuation of further rituximab. April 2023- IgG- 652.   # NICMP- [35-40%%- July 28th 2021]--  EVAL; NO CRT [Dr.End/Dr.Klein]-on coreg/spirinilactone; July 2022- 2D echo 40 to 45% ejection fraction- STABLE;; continue lasix 20 mg/day [sec to dizzy spells]-   #Chronic respiratory failure -2 L home O2 -NICMP/pulmonary hypertension/chronic bilateral lung scarring-followed by pulmonary [[place]]-STABLE   # Vaccination: recommend Pneumonia shot if not given in last 5 years. Pt to check with PCP.   # Left shoulder pain/ Chronic arthritis-on meloxicam as needed- STABLE.   # DISPOSITION: fridays pt pref-  # follow up-MD in 4 weeks [ MD ;labs- cbc/cmp/ldh/BNP]- ;.Dr.B  # I reviewed the blood work- with the patient in detail; also reviewed the imaging independently [as summarized above]; and with the patient in detail.

## 2021-10-25 NOTE — Telephone Encounter (Signed)
Please re read my note Pt was not seen by cardiology Please see note below and call to move appt  up  Thank you

## 2021-10-26 ENCOUNTER — Encounter: Payer: Self-pay | Admitting: Internal Medicine

## 2021-10-30 NOTE — Telephone Encounter (Signed)
Patient is scheduled for 11/27/2021 to see you and  you only have 15 mins slots is it ok to use one of those  she would not take the 30 min at 12 on June 12 and after that you have hospital follow ups.  Please advise. Johnny Gorter,cma

## 2021-11-01 NOTE — Telephone Encounter (Signed)
She needs earlier f/u with cardiology, not me. Appt 6/28 with me is fine  Please call her cardiologist - see original result note

## 2021-11-01 NOTE — Telephone Encounter (Signed)
Patient is scheduled to see her cardiologist on 11/22/2021.  Joetta Delprado,cma

## 2021-11-11 ENCOUNTER — Other Ambulatory Visit (HOSPITAL_COMMUNITY): Payer: Self-pay

## 2021-11-15 ENCOUNTER — Ambulatory Visit (INDEPENDENT_AMBULATORY_CARE_PROVIDER_SITE_OTHER): Payer: Medicare HMO

## 2021-11-15 VITALS — Ht 67.0 in | Wt 248.0 lb

## 2021-11-15 DIAGNOSIS — Z0000 Encounter for general adult medical examination without abnormal findings: Secondary | ICD-10-CM

## 2021-11-15 NOTE — Patient Instructions (Addendum)
  Michelle [name] , Thank you for taking time to come for your Medicare Wellness Visit. I appreciate your ongoing commitment to your health goals. Please review the following plan we discussed and let me know if I can assist you in the future.   These are the goals we discussed:  Goals       Patient Stated     Increase physical activity (pt-stated)      Chair exercises      Other     Healthy Lifetsyle      Stay hydrated Healthy diet Walk for exercise as tolerated        This is a list of the screening recommended for you and due dates:  Health Maintenance  Topic Date Due   Tetanus Vaccine  07/26/2022*   COVID-19 Vaccine (5 - Booster for Pfizer series) 08/13/2022*   Flu Shot  12/31/2021   Pneumonia Vaccine  Completed   DEXA scan (bone density measurement)  Completed   Zoster (Shingles) Vaccine  Completed   HPV Vaccine  Aged Out   Colon Cancer Screening  Discontinued   Hepatitis C Screening: USPSTF Recommendation to screen - Ages 68-79 yo.  Discontinued  *Topic was postponed. The date shown is not the original due date.

## 2021-11-15 NOTE — Progress Notes (Signed)
Subjective:   Michelle [name] is a 81 y.o. female who presents for Medicare Annual (Subsequent) preventive examination.  Review of Systems    No ROS.  Medicare Wellness Virtual Visit.  Visual/audio telehealth visit, UTA vital signs.   See social history for additional risk factors.   Cardiac Risk Factors include: advanced age (>74mn, >>78women)     Objective:    Today's Vitals   11/15/21 1508  Weight: 248 lb (112.5 kg)  Height: '5\' 7"'$  (1.702 m)   Body mass index is 38.84 kg/m.     11/15/2021    3:14 PM 10/25/2021    9:19 AM 10/03/2021    4:38 AM 10/02/2021    2:00 PM 08/28/2021    8:11 PM 08/28/2021   11:40 AM 07/12/2021    8:30 AM  Advanced Directives  Does Patient Have a Medical Advance Directive? No No  No No No No  Would patient like information on creating a medical advance directive? No - Patient declined No - Patient declined No - Patient declined  Yes (Inpatient - patient requests chaplain consult to create a medical advance directive)  No - Patient declined    Current Medications (verified) Outpatient Encounter Medications as of 11/15/2021  Medication Sig   acetaminophen (TYLENOL) 500 MG tablet Take 1,000 mg by mouth every 6 (six) hours as needed for mild pain.    albuterol (VENTOLIN HFA) 108 (90 Base) MCG/ACT inhaler 2 puffs inhaled orally every 4 to 6 hours as needed, not to exceed 12 puffs in 24 hours   apixaban (ELIQUIS) 5 MG TABS tablet Take 1 tablet (5 mg total) by mouth 2 (two) times daily.   feeding supplement (ENSURE ENLIVE / ENSURE PLUS) LIQD Take 237 mLs by mouth 3 (three) times daily between meals.   fluticasone (FLONASE) 50 MCG/ACT nasal spray PLACE 2 SPRAYS INTO BOTH NOSTRILS DAILY AS NEEDED FOR ALLERGIES.   ipratropium-albuterol (DUONEB) 0.5-2.5 (3) MG/3ML SOLN Take 3 mLs by nebulization every 6 (six) hours as needed.   MEGARED OMEGA-3 KRILL OIL PO Take 1 tablet by mouth daily.   meloxicam (MOBIC) 7.5 MG tablet TAKE 1 TABLET BY MOUTH EVERY DAY AS  NEEDED FOR PAIN   Multiple Vitamins-Minerals (CENTRUM SILVER 50+WOMEN) TABS Take 1 tablet by mouth daily.   predniSONE (DELTASONE) 10 MG tablet Take two tablets daily for 1 week, then 10 tablets daily until gone   rosuvastatin (CRESTOR) 5 MG tablet TAKE 1 TABLET (5 MG TOTAL) BY MOUTH DAILY.   Facility-Administered Encounter Medications as of 11/15/2021  Medication   heparin lock flush 100 unit/mL   sodium chloride flush (NS) 0.9 % injection 10 mL   sodium chloride flush (NS) 0.9 % injection 10 mL    Allergies (verified) Patient has no known allergies.   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   Personal history of chemotherapy    Rheumatoid arthritis ([place]    Past Surgical History:  Procedure Laterality Date  BREAST BIOPSY Left 11/07/2020   u/s bx-hydromark #3 "coil"-path pending   CARDIAC CATHETERIZATION  09/2004   [facility]; EF 60%   CARDIAC CATHETERIZATION  08/2004   [facility]   IR FLUORO GUIDED NEEDLE PLC ASPIRATION/INJECTION LOC  06/18/2018   PERIPHERAL VASCULAR CATHETERIZATION N/A 07/04/2015   Procedure: [name] Insertion;  Surgeon: [name], MD;  Location: [facility];  Service: Cardiovascular;  Laterality: N/A;   PORTA CATH REMOVAL N/A 06/23/2018   Procedure: PORTA CATH REMOVAL;  Surgeon: [name], MD;  Location: [facility];  Service: Cardiovascular;  Laterality: N/A;   RIGHT/LEFT HEART CATH AND CORONARY ANGIOGRAPHY Bilateral 09/20/2019   Procedure: RIGHT/LEFT HEART CATH AND CORONARY  ANGIOGRAPHY;  Surgeon: Nelva Bush, MD;  Location: [facility];  Service: Cardiovascular;  Laterality: Bilateral;   Family History  Problem Relation Age of Onset   Diabetes Mother    Cholelithiasis Mother    Hypertension Sister    Diabetes Sister    Heart murmur Sister    Arthritis Brother    Breast cancer Neg Hx    Social History   Socioeconomic History   Marital status: Married    Spouse name: Not on file   Number of children: Not on file   Years of education: Not on file   Highest education level: Not on file  Occupational History   Not on file  Tobacco Use   Smoking status: Never   Smokeless tobacco: Never  Vaping Use   Vaping Use: Never used  Substance and Sexual Activity   Alcohol use: No   Drug use: No   Sexual activity: Never  Other Topics Concern   Not on file  Social History Narrative   ** Merged History Encounter **       Lives in [place]. Works as Psychiatrist.   Social Determinants of Health   Financial Resource Strain: Low Risk  (11/15/2021)   Overall Financial Resource Strain (CARDIA)    Difficulty of Paying Living Expenses: Not hard at all  Food Insecurity: No Food Insecurity (11/15/2021)   Hunger Vital Sign    Worried About Running Out of Food in the Last Year: Never true    Ran Out of Food in the Last Year: Never true  Transportation Needs: No Transportation Needs (11/15/2021)   PRAPARE - Hydrologist (Medical): No    Lack of Transportation (Non-Medical): No  Physical Activity: Insufficiently Active (11/15/2021)   Exercise Vital Sign    Days of Exercise per Week: 7 days    Minutes of Exercise per Session: 10 min  Stress: No Stress Concern Present (11/15/2021)   [place]    Feeling of Stress : Not at all  Social Connections: Unknown (11/15/2021)   Social Connection and Isolation Panel [NHANES]    Frequency of Communication with  Friends and Family: More than three times a week    Frequency of Social Gatherings with Friends and Family: More than three times a week    Attends Religious Services: Not on Advertising copywriter or Organizations: Not on file    Attends Archivist Meetings: Not on file    Marital Status: Not on file    Tobacco Counseling Counseling given: Not Answered   Clinical Intake:  Pre-visit preparation completed: Yes           How often do you need to have  someone help you when you read instructions, pamphlets, or other written materials from your doctor or pharmacy?: 1 - Never   Interpreter Needed?: No      Activities of Daily Living    11/15/2021    3:18 PM 10/03/2021    4:39 AM  In your present state of health, do you have any difficulty performing the following activities:  Hearing? 0 0  Vision? 0 0  Difficulty concentrating or making decisions? 0 0  Walking or climbing stairs? 1 1  Dressing or bathing? 0 0  Doing errands, shopping? 1 1  Preparing Food and eating ? Y   Comment Sister assist with meal prep. Self feeds.   Using the Toilet? N   In the past six months, have you accidently leaked urine? N   Do you have problems with loss of bowel control? N   Managing your Medications? Y   Comment Sister assist   Managing your Finances? Y   Comment Sister assist   Housekeeping or managing your Housekeeping? Y   Comment Sister assist     Patient Care Team: Burnard Hawthorne, FNP as PCP - General (Family Medicine) End, Harrell Gave, MD as PCP - Cardiology (Cardiology) Deboraha Sprang, MD as PCP - Electrophysiology (Cardiology) Jackolyn Confer, MD (Internal Medicine) Cammie Sickle, MD as Consulting Physician (Internal Medicine)  Indicate any recent Medical Services you may have received from other than [facility] providers in the past year (date may be approximate).     Assessment:   This is a routine wellness examination for  [place].  Virtual Visit via Telephone Note  I connected with  [name] on 11/15/21 at  3:00 PM EDT by telephone and verified that I am speaking with the correct person using two identifiers.  Persons participating in the virtual visit: patient/Nurse Health Advisor   I discussed the limitations of performing an evaluation and management service by telehealth. We continued and completed visit with audio only. Some vital signs may be absent or patient reported.   Hearing/Vision screen Hearing Screening - Comments:: Patient is able to hear conversational tones without difficulty.  No issues reported. Vision Screening - Comments:: Followed by Dr. Jerold Coombe [facility]) Wears corrective lenses Cataract extraction, L  They have regular follow up with the ophthalmologist  Dietary issues and exercise activities discussed: Current Exercise Habits: Home exercise routine, Intensity: Mild Healthy diet    Goals Addressed               This Visit's Progress     Patient Stated     Increase physical activity (pt-stated)        Chair exercises      Other     Healthy Lifetsyle        Stay hydrated Healthy diet Walk for exercise as tolerated      COMPLETED: Weight (lb) < 250 lb (113.4 kg)   248 lb (112.5 kg)     Lose 10lb       Depression Screen    11/15/2021    3:12 PM 10/02/2021    8:42 AM 07/26/2021   11:23 AM 07/25/2021    4:43 PM 01/21/2021    3:40 PM 11/14/2020   10:37 AM 10/12/2020   10:33 AM  PHQ 2/9 Scores  PHQ - 2 Score 0 0 0 0 0 0 0  PHQ- 9 Score 0 0 0      Exception Documentation    Patient refusal  Fall Risk    11/15/2021    3:17 PM 10/02/2021    8:41 AM 07/26/2021   11:24 AM 07/25/2021    4:42 PM 01/21/2021    3:39 PM  Fall Risk   Falls in the past year? 0 0 0 0 0  Number falls in past yr:  0 0 0 0  Injury with Fall?  0 0 0 0  Risk for fall due to : Impaired balance/gait No Fall Risks No Fall Risks No Fall Risks   Risk for fall due to:  Comment Walker/Cane      Follow up Education provided Falls evaluation completed Falls evaluation completed Falls evaluation completed Falls evaluation completed    [place]: Home free of loose throw rugs in walkways, pet beds, electrical cords, etc? Yes  Adequate lighting in your home to reduce risk of falls? Yes   ASSISTIVE DEVICES UTILIZED TO PREVENT FALLS: Life alert? No  Use of a cane, walker or w/c? Yes   TIMED UP AND GO: Was the test performed? No .   Cognitive Function: Patient is alert and oriented x3.     11/07/2016    3:37 PM  MMSE - Mini Mental State Exam  Orientation to time 5  Orientation to Place 5  Registration 3  Attention/ Calculation 3  Attention/Calculation-comments Some difficulty calculating   Recall 3  Language- name 2 objects 2  Language- repeat 1  Language- follow 3 step command 3  Language- read & follow direction 1  Write a sentence 1  Copy design 1  Total score 28        11/14/2019   10:45 AM 11/12/2018   10:10 AM 11/09/2017    3:29 PM  6CIT Screen  What Year? 0 points 0 points 0 points  What month? 0 points 0 points 0 points  What time? 0 points 0 points 0 points  Count back from 20 0 points 0 points 0 points  Months in reverse   4 points  Repeat phrase   0 points  Total Score   4 points    Immunizations Immunization History  Administered Date(s) Administered   Influenza Split 02/18/2012   Influenza, High Dose Seasonal PF 01/29/2017, 02/19/2018, 02/23/2020   Influenza,inj,Quad PF,6+ Mos 04/01/2013, 03/02/2014, 06/25/2015   Influenza-Unspecified 03/14/2016, 02/23/2019   PFIZER(Purple Top)SARS-COV-2 Vaccination 07/18/2019, 08/08/2019, 03/24/2020, 08/07/2020   Pneumococcal Conjugate-13 01/12/2014   Pneumococcal Polysaccharide-23 11/12/2009   Tdap 11/12/2009   Zoster Recombinat (Shingrix) 03/15/2019, 05/20/2019   Screening Tests Health Maintenance  Topic Date Due   TETANUS/TDAP  07/26/2022  (Originally 11/13/2019)   COVID-19 Vaccine (5 - Booster for [place] series) 08/13/2022 (Originally 10/02/2020)   INFLUENZA VACCINE  12/31/2021   Pneumonia Vaccine 82+ Years old  Completed   DEXA SCAN  Completed   Zoster Vaccines- Shingrix  Completed   HPV VACCINES  Aged Out   COLONOSCOPY (Pts 45-71yr Insurance coverage will need to be confirmed)  Discontinued   Hepatitis C Screening  Discontinued   Health Maintenance There are no preventive care reminders to display for this patient.  Lung Cancer Screening: (Low Dose CT Chest recommended if Age 79-80years, 30 pack-year currently smoking OR have quit w/in 15years.) does not qualify.   Vision Screening: Recommended annual ophthalmology exams for early detection of glaucoma and other disorders of the eye.  Dental Screening: Recommended annual dental exams for proper oral hygiene  Community Resource Referral / Chronic Care Management: CRR required this visit?  No   CCM required this visit?  No      Plan:   Keep all routine maintenance appointments.   I have personally reviewed and noted the following in the patient's chart:   Medical and social history Use of alcohol, tobacco or illicit drugs  Current medications and supplements including opioid prescriptions.  Functional ability and status Nutritional status Physical activity Advanced directives List of other physicians Hospitalizations, surgeries, and ER visits in previous 12 months Vitals Screenings to include cognitive, depression, and falls Referrals and appointments  In addition, I have reviewed and discussed with patient certain preventive protocols, quality metrics, and best practice recommendations. A written personalized care plan for preventive services as well as general preventive health recommendations were provided to patient.     Varney Biles, LPN   [id number]

## 2021-11-17 NOTE — Progress Notes (Unsigned)
Synopsis: Referred for organizing pneumonia by Burnard Hawthorne, FNP  Subjective:   PATIENT ID: Michelle Pert GENDER: female DOB: [birth year], MRN: [id number]  No chief complaint on file.  80yF with history of NICM with recovered EF, diastolic dysfunction, OSA on CPAP followed by Dr. 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 She had covid-19 infetion 08/02/21 and was treated with molnupravir with improvement in symptoms but had residual cough. On 3/17 seen by oncology in clinic and started on course of augmentin for presumed LLL pna.    She presented to [facility] for worsening dyspnea and cough and was admitted 08/28/21 and started on vanc/cefepime then CTX/azithro. Procal undetectable 3/30. Found to still test positive for covid. Given covid Ig, long steroid taper, discharged 4/8 on home O2 4L continuous. Looked sick in clinic 5/3 and sent to ED with concern for pna - CXR at this time actually looked improved relative to priors. She completed CAP course and was discharged on 2L O2.   Has concentrator at home and large tanks. Has been using 3L continuous. DME supplier is [employer].   Interval HPI: Marked improvement in ggos. Background of UIP pattern fibrosis remains, not significantly progressed since 04/2020  PFTs today   Otherwise pertinent review of systems is negative.  Past Medical History:  Diagnosis Date   Arthritis    Collagen vascular disease (HCC)    GERD (gastroesophageal reflux disease)    Headache(784.0)    HFrEF (heart failure with reduced ejection fraction) (HCC)    Nonischemic cardiomyopathy, LVEF as low as 30-35% in 08/2019   History of methotrexate therapy    Hyperlipidemia    hx   Lymphadenopathy of head and neck 01/2015   see on Thyroid ultrasound   Lymphoma, mantle cell ([place]) 06/01/2015   bx of lymph node in right breast/Stage IV Mantle  Cell Lymphoma   Personal history of chemotherapy    Rheumatoid arthritis ([place])      Family History  Problem Relation Age of Onset   Diabetes Mother    Cholelithiasis Mother    Hypertension Sister    Diabetes Sister    Heart murmur Sister    Arthritis Brother    Breast cancer Neg Hx      Past Surgical History:  Procedure Laterality Date   BREAST BIOPSY Left 11/07/2020   u/s bx-hydromark #3 "coil"-path pending   CARDIAC CATHETERIZATION  09/2004   [facility]; EF 60%   CARDIAC CATHETERIZATION  08/2004   [facility]   IR FLUORO GUIDED NEEDLE PLC ASPIRATION/INJECTION LOC  06/18/2018   PERIPHERAL VASCULAR CATHETERIZATION N/A 07/04/2015   Procedure: [name] Insertion;  Surgeon: [name], MD;  Location: [place] [facility];  Service: Cardiovascular;  Laterality: N/A;   PORTA CATH REMOVAL N/A 06/23/2018   Procedure: PORTA CATH REMOVAL;  Surgeon: [name], MD;  Location: [place] [facility];  Service: Cardiovascular;  Laterality: N/A;   RIGHT/LEFT HEART CATH AND CORONARY ANGIOGRAPHY Bilateral 09/20/2019   Procedure: RIGHT/LEFT HEART CATH AND CORONARY ANGIOGRAPHY;  Surgeon: Nelva Bush, MD;  Location: [place] [facility];  Service: Cardiovascular;  Laterality: Bilateral;    Social History   Socioeconomic History   Marital status: Married    Spouse name: Not on file   Number of children: Not on file   Years of education: Not on file   Highest education level: Not on file  Occupational History   Not on file  Tobacco Use   Smoking status: Never   Smokeless tobacco: Never  Vaping Use   Vaping Use: Never used  Substance and Sexual Activity   Alcohol use: No   Drug use: No   Sexual activity: Never  Other Topics Concern   Not on file  Social History Narrative   ** Merged History Encounter **       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   Social Determinants of Health   Financial Resource Strain: Low Risk  (11/15/2021)   Overall Financial Resource Strain (CARDIA)     Difficulty of Paying Living Expenses: Not hard at all  Food Insecurity: No Food Insecurity (11/15/2021)   Hunger Vital Sign    Worried About Running Out of Food in the Last Year: Never true    Ran Out of Food in the Last Year: Never true  Transportation Needs: No Transportation Needs (11/15/2021)   PRAPARE - Hydrologist (Medical): No    Lack of Transportation (Non-Medical): No  Physical Activity: Insufficiently Active (11/15/2021)   Exercise Vital Sign    Days of Exercise per Week: 7 days    Minutes of Exercise per Session: 10 min  Stress: No Stress Concern Present (11/15/2021)   [place]    Feeling of Stress : Not at all  Social Connections: Unknown (11/15/2021)   Social Connection and Isolation Panel [NHANES]    Frequency of Communication with Friends and Family: More than three times a week    Frequency of Social Gatherings with Friends and Family: More than three times a week    Attends Religious Services: Not on file    Active Member of [place] or Organizations: Not on file    Attends Archivist Meetings: Not on file    Marital Status: Not on file  Intimate Partner Violence: Not At Risk (11/15/2021)   Humiliation, Afraid, Rape, and Kick questionnaire    Fear of Current or Ex-Partner: No    Emotionally Abused: No    Physically Abused: No    Sexually Abused: No     No Known Allergies   Outpatient Medications Prior to Visit  Medication Sig Dispense Refill   acetaminophen (TYLENOL) 500 MG tablet Take 1,000 mg by mouth every 6 (six) hours as needed for mild pain.      albuterol (VENTOLIN HFA) 108 (90 Base) MCG/ACT inhaler 2 puffs inhaled orally every 4 to 6 hours as needed, not to exceed 12 puffs in 24 hours 18 each 1   apixaban (ELIQUIS) 5 MG TABS tablet Take 1 tablet (5 mg total) by mouth 2 (two) times daily. 60 tablet 2   feeding supplement (ENSURE ENLIVE / ENSURE PLUS) LIQD  Take 237 mLs by mouth 3 (three) times daily between meals. 237 mL 12   fluticasone (FLONASE) 50 MCG/ACT nasal spray PLACE 2 SPRAYS INTO BOTH NOSTRILS DAILY AS NEEDED FOR ALLERGIES. 48 mL 1   ipratropium-albuterol (DUONEB) 0.5-2.5 (3) MG/3ML SOLN Take 3 mLs by nebulization every 6 (six) hours as needed. 1080 mL 0   MEGARED OMEGA-3 KRILL OIL PO Take 1 tablet by mouth daily.     meloxicam (MOBIC) 7.5 MG tablet TAKE 1 TABLET BY MOUTH EVERY DAY AS NEEDED FOR PAIN 30 tablet 1   Multiple Vitamins-Minerals (CENTRUM SILVER 50+WOMEN) TABS Take 1 tablet by mouth daily.     predniSONE (DELTASONE) 10 MG tablet Take  two tablets daily for 1 week, then 10 tablets daily until gone 28 tablet 0   rosuvastatin (CRESTOR) 5 MG tablet TAKE 1 TABLET (5 MG TOTAL) BY MOUTH DAILY. 90 tablet 3   Facility-Administered Medications Prior to Visit  Medication Dose Route Frequency Provider Last Rate Last Admin   heparin lock flush 100 unit/mL  500 Units Intravenous Once Charlaine Dalton R, MD       sodium chloride flush (NS) 0.9 % injection 10 mL  10 mL Intravenous Once Charlaine Dalton R, MD       sodium chloride flush (NS) 0.9 % injection 10 mL  10 mL Intravenous Once Cammie Sickle, MD           Objective:   Physical Exam:  General appearance: 81 y.o., female, NAD, conversant  Eyes: anicteric sclerae; PERRL, tracking appropriately HENT: NCAT; MMM Neck: Trachea midline; no lymphadenopathy, no JVD Lungs: CTAB, no crackles, no wheeze, with normal respiratory effort CV: RRR, no murmur  Abdomen: Soft, non-tender; non-distended, BS present  Extremities: No peripheral edema, warm Skin: Normal turgor and texture; no rash Psych: Appropriate affect Neuro: Alert and oriented to person and place, no focal deficit     There were no vitals filed for this visit.    on 2 LPM  BMI Readings from Last 3 Encounters:  11/15/21 38.84 kg/m  10/25/21 38.98 kg/m  10/21/21 38.09 kg/m   Wt Readings from Last 3  Encounters:  11/15/21 248 lb (112.5 kg)  10/25/21 248 lb 14.4 oz (112.9 kg)  10/21/21 243 lb 3.2 oz (110.3 kg)     CBC    Component Value Date/Time   WBC 12.9 (H) 10/25/2021 0900   RBC 4.20 10/25/2021 0900   HGB 12.6 10/25/2021 0900   HGB 13.4 09/08/2019 1359   HCT 40.1 10/25/2021 0900   HCT 40.9 09/08/2019 1359   PLT 190 10/25/2021 0900   PLT 194 09/08/2019 1359   MCV 95.5 10/25/2021 0900   MCV 90 09/08/2019 1359   MCV 89 09/14/2013 1127   MCH 30.0 10/25/2021 0900   MCHC 31.4 10/25/2021 0900   RDW 18.3 (H) 10/25/2021 0900   RDW 13.9 09/08/2019 1359   RDW 15.1 (H) 09/14/2013 1127   LYMPHSABS 0.5 (L) 10/25/2021 0900   LYMPHSABS 1.3 09/08/2019 1359   MONOABS 0.4 10/25/2021 0900   EOSABS 0.1 10/25/2021 0900   EOSABS 0.1 09/08/2019 1359   BASOSABS 0.0 10/25/2021 0900   BASOSABS 0.0 09/08/2019 1359    Chest Imaging: CT Chest 09/02/21 with widespread GGO, reticular opacities   CXR 10/02/21 with substantial improvement in aeration  HRCT CHest 10/25/21 reviewed by me with marked improvement in ggos. Background of UIP pattern fibrosis remains, not significantly progressed since 04/2020   Pulmonary Functions Testing Results:     No data to display           Echocardiogram:   08/31/21:  1. Left ventricular ejection fraction, by estimation, is 60 to 65%. The  left ventricle has normal function. The left ventricle has no regional  wall motion abnormalities. There is moderate concentric left ventricular  hypertrophy. Left ventricular  diastolic parameters are consistent with Grade I diastolic dysfunction  (impaired relaxation).   2. Right ventricular systolic function is normal. The right ventricular  size is moderately enlarged.   3. Left atrial size was moderately dilated.   4. Right atrial size was moderately dilated.   5. The mitral valve is normal in structure. Trivial mitral valve  regurgitation. No  evidence of mitral stenosis.   6. Tricuspid valve regurgitation is  moderate.   7. The aortic valve is normal in structure. Aortic valve regurgitation is  not visualized. Aortic valve sclerosis is present, with no evidence of  aortic valve stenosis.   8. The inferior vena cava is normal in size with greater than 50%  respiratory variability, suggesting right atrial pressure of 3 mmHg.   9. Agitated saline contrast bubble study was negative, with no evidence  of any interatrial shunt.       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 Dr. Patsey Berthold ordered KL-6 during hospitalization which was elevated - this marker tends to simply reflect severity of ILD and is more commonly elevated with DAD pattern than for OP. It does not help distinguish whether her widespread ggo/consolidation were due to active covid-19 pneumonitis/ARDS from covid vs drug-induced pneumonitis from rituximab. Clinically and radiographically improving comparing CXR last week to one during admission in early April.  # Chronic hypoxic respiratory failure: On 2L continuous by [place] with [employer]. Not candidate for POC at this time based on walking oximetry today.  # OSA not on CPAP:  # Provoked PE:  Plan: - prednisone taper to off in 3 weeks - see instructions. If deterioration while coming off steroids she knows to reach out to our office  - HRCT Chest ordered - PFTs next visit in a month - will need at least 3 months of eliquis for provoked PE in setting of covid, may need to entertain longer course however in setting of her persistent risk factor with [place], MD [facility] 11/17/2021 8:11 AM

## 2021-11-19 ENCOUNTER — Ambulatory Visit (INDEPENDENT_AMBULATORY_CARE_PROVIDER_SITE_OTHER): Payer: Medicare HMO | Admitting: Student

## 2021-11-19 ENCOUNTER — Encounter: Payer: Self-pay | Admitting: Student

## 2021-11-19 VITALS — BP 126/64 | HR 75 | Ht 67.0 in | Wt 252.8 lb

## 2021-11-19 DIAGNOSIS — J8489 Other specified interstitial pulmonary diseases: Secondary | ICD-10-CM | POA: Diagnosis not present

## 2021-11-19 DIAGNOSIS — J9611 Chronic respiratory failure with hypoxia: Secondary | ICD-10-CM

## 2021-11-19 LAB — PULMONARY FUNCTION TEST
FEF 25-75 Post: 2.31 L/sec
FEF 25-75 Pre: 1.13 L/sec
FEF2575-%Change-Post: 105 %
FEF2575-%Pred-Post: 143 %
FEF2575-%Pred-Pre: 70 %
FEV1-%Change-Post: 13 %
FEV1-%Pred-Post: 60 %
FEV1-%Pred-Pre: 53 %
FEV1-Post: 1.15 L
FEV1-Pre: 1.02 L
FEV1FVC-%Change-Post: 10 %
FEV1FVC-%Pred-Pre: 108 %
FEV6-%Change-Post: 2 %
FEV6-%Pred-Post: 54 %
FEV6-%Pred-Pre: 52 %
FEV6-Post: 1.27 L
FEV6-Pre: 1.24 L
FEV6FVC-%Pred-Post: 104 %
FEV6FVC-%Pred-Pre: 104 %
FVC-%Change-Post: 2 %
FVC-%Pred-Post: 52 %
FVC-%Pred-Pre: 50 %
FVC-Post: 1.27 L
FVC-Pre: 1.24 L
Post FEV1/FVC ratio: 91 %
Post FEV6/FVC ratio: 100 %
Pre FEV1/FVC ratio: 82 %
Pre FEV6/FVC Ratio: 100 %
RV % pred: 61 %
RV: 1.54 L
TLC % pred: 55 %
TLC: 3.08 L

## 2021-11-19 NOTE — Progress Notes (Signed)
Attempted Full PFT Today. Performed Pre/Post Spirometry and Lung Volumes.

## 2021-11-19 NOTE — Patient Instructions (Addendum)
Attempted Full PFT Today. Performed Pre/Post Spirometry and Lung Volumes.

## 2021-11-19 NOTE — Patient Instructions (Addendum)
-   CT chest, PFTs in a year - call or send my chart message if you're doing any worse from breathing, cough standpoint before next visit - tentative plan to stay on eliquis at least until March 2024 but would talk about this with oncology FNP [place] - see you in a year

## 2021-11-21 DIAGNOSIS — J189 Pneumonia, unspecified organism: Secondary | ICD-10-CM | POA: Diagnosis not present

## 2021-11-21 DIAGNOSIS — J9601 Acute respiratory failure with hypoxia: Secondary | ICD-10-CM | POA: Diagnosis not present

## 2021-11-21 DIAGNOSIS — G4733 Obstructive sleep apnea (adult) (pediatric): Secondary | ICD-10-CM | POA: Diagnosis not present

## 2021-11-21 DIAGNOSIS — I2699 Other pulmonary embolism without acute cor pulmonale: Secondary | ICD-10-CM | POA: Diagnosis not present

## 2021-11-22 ENCOUNTER — Inpatient Hospital Stay: Payer: Medicare HMO | Attending: Internal Medicine

## 2021-11-22 ENCOUNTER — Inpatient Hospital Stay (HOSPITAL_BASED_OUTPATIENT_CLINIC_OR_DEPARTMENT_OTHER): Payer: Medicare HMO | Admitting: Internal Medicine

## 2021-11-22 ENCOUNTER — Encounter: Payer: Self-pay | Admitting: Internal Medicine

## 2021-11-22 DIAGNOSIS — C8311 Mantle cell lymphoma, lymph nodes of head, face, and neck: Secondary | ICD-10-CM | POA: Diagnosis not present

## 2021-11-22 DIAGNOSIS — I272 Pulmonary hypertension, unspecified: Secondary | ICD-10-CM | POA: Diagnosis not present

## 2021-11-22 DIAGNOSIS — M25512 Pain in left shoulder: Secondary | ICD-10-CM | POA: Diagnosis not present

## 2021-11-22 DIAGNOSIS — J9610 Chronic respiratory failure, unspecified whether with hypoxia or hypercapnia: Secondary | ICD-10-CM | POA: Insufficient documentation

## 2021-11-22 DIAGNOSIS — Z79899 Other long term (current) drug therapy: Secondary | ICD-10-CM | POA: Insufficient documentation

## 2021-11-22 DIAGNOSIS — Z9981 Dependence on supplemental oxygen: Secondary | ICD-10-CM | POA: Diagnosis not present

## 2021-11-22 DIAGNOSIS — R0602 Shortness of breath: Secondary | ICD-10-CM | POA: Insufficient documentation

## 2021-11-22 LAB — CBC WITH DIFFERENTIAL/PLATELET
Abs Immature Granulocytes: 0.05 10*3/uL (ref 0.00–0.07)
Basophils Absolute: 0 10*3/uL (ref 0.0–0.1)
Basophils Relative: 0 %
Eosinophils Absolute: 0.2 10*3/uL (ref 0.0–0.5)
Eosinophils Relative: 2 %
HCT: 41.5 % (ref 36.0–46.0)
Hemoglobin: 12.8 g/dL (ref 12.0–15.0)
Immature Granulocytes: 1 %
Lymphocytes Relative: 15 %
Lymphs Abs: 1.3 10*3/uL (ref 0.7–4.0)
MCH: 29.6 pg (ref 26.0–34.0)
MCHC: 30.8 g/dL (ref 30.0–36.0)
MCV: 96.1 fL (ref 80.0–100.0)
Monocytes Absolute: 0.5 10*3/uL (ref 0.1–1.0)
Monocytes Relative: 6 %
Neutro Abs: 6.7 10*3/uL (ref 1.7–7.7)
Neutrophils Relative %: 76 %
Platelets: 175 10*3/uL (ref 150–400)
RBC: 4.32 MIL/uL (ref 3.87–5.11)
RDW: 15.1 % (ref 11.5–15.5)
WBC: 8.8 10*3/uL (ref 4.0–10.5)
nRBC: 0 % (ref 0.0–0.2)

## 2021-11-22 LAB — COMPREHENSIVE METABOLIC PANEL
ALT: 18 U/L (ref 0–44)
AST: 23 U/L (ref 15–41)
Albumin: 3.4 g/dL — ABNORMAL LOW (ref 3.5–5.0)
Alkaline Phosphatase: 46 U/L (ref 38–126)
Anion gap: 5 (ref 5–15)
BUN: 16 mg/dL (ref 8–23)
CO2: 30 mmol/L (ref 22–32)
Calcium: 9 mg/dL (ref 8.9–10.3)
Chloride: 103 mmol/L (ref 98–111)
Creatinine, Ser: 0.64 mg/dL (ref 0.44–1.00)
GFR, Estimated: >60 mL/min (ref >60)
Glucose, Bld: 102 mg/dL — ABNORMAL HIGH (ref 70–99)
Potassium: 4.3 mmol/L (ref 3.5–5.1)
Sodium: 138 mmol/L (ref 135–145)
Total Bilirubin: 0.8 mg/dL (ref 0.3–1.2)
Total Protein: 6.3 g/dL — ABNORMAL LOW (ref 6.5–8.1)

## 2021-11-22 LAB — BRAIN NATRIURETIC PEPTIDE: B Natriuretic Peptide: 47.6 pg/mL (ref 0.0–100.0)

## 2021-11-22 LAB — LACTATE DEHYDROGENASE: LDH: 214 U/L — ABNORMAL HIGH (ref 98–192)

## 2021-11-22 NOTE — Progress Notes (Signed)
[facility] OFFICE PROGRESS NOTE  Patient Care Team: Allegra Grana, FNP as PCP - General (Family Medicine) End, Cristal Deer, MD as PCP - Cardiology (Cardiology) Duke Salvia, MD as PCP - Electrophysiology (Cardiology) Shelia Media, MD (Internal Medicine) Earna Coder, MD as Consulting Physician (Internal Medicine)   Cancer Staging  No matching staging information was found for the patient.   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 Feb 9th- START Benda-Ritux with neulasta; Prolonged neutropenia; DISCONT- Benda-Ritux;   # April 13 th 2017- START R-CHOP x1; severe/prolonged neutropenia; PET- CR; BMBx-Neg; Disc R-CHOP # June 2022- DIAGNOSIS: thickened cortex of 12 mm. An additional lymph node demonstrates a thickened cortex of 7 mm. A third lymph node demonstrates a mildly thickened cortex of 4 mm A. LYMPH NODE, LEFT AXILLA; ULTRASOUND-GUIDED BIOPSY:  - CD5+ MONOCLONAL B-CELL POPULATION; COMPATIBLE WITH INVOLVEMENT BY THE  PATIENT'S KNOWN MANTLE CELL LYMPHOMA.   #July 2022-second week-started ibrutinib 420 mg a dayx 1 week-stop because of severe rash.  Significant clinical response noted.  # # 26th MAY 2017- Start Rituxan q 32M Main OCT 12th 2017- PET NED.    # AUG 1st, 2022- start acalbrutinib. 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    Michelle [name] 81 y.o.  female pleasant patient above history of recurrent mantle cell lymphoma on Calquence + rituximab is here for follow-up.  However rituximab is on hold because of multifocal pneumonia/given the concerns for immunosuppression.  Patient continues to be on 2 L of oxygen but as needed.  No further hospitalizations.   Otherwise no worsening constipation or shortness of breath or cough. No swelling in legs.   Review of Systems  Constitutional:  Negative for chills, diaphoresis, fever, malaise/fatigue and weight loss.  HENT:  Negative for nosebleeds and sore throat.   Eyes:  Negative for double vision.  Respiratory:  Negative for hemoptysis, sputum production and wheezing.   Cardiovascular:  Negative for chest pain, palpitations, orthopnea and leg swelling.  Gastrointestinal:  Positive for constipation. Negative for abdominal pain, blood in stool, diarrhea, heartburn, melena, nausea and vomiting.  Genitourinary:  Negative for dysuria, frequency and urgency.  Musculoskeletal:  Positive for back pain and joint pain.  Skin: Negative.  Negative for itching and rash.  Neurological:  Negative for dizziness, tingling, focal weakness, weakness and headaches.  Endo/Heme/Allergies:  Does not bruise/bleed easily.  Psychiatric/Behavioral:  Negative for depression. The patient is not nervous/anxious and does not have insomnia.     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% in 08/2019   History of methotrexate therapy  Hyperlipidemia    hx   Lymphadenopathy of head and neck 01/2015   see on Thyroid ultrasound   Lymphoma, mantle cell (HCC) 06/01/2015   bx of lymph node in right breast/Stage IV Mantle Cell Lymphoma   Multifocal  pneumonia 10/05/2021   Personal history of chemotherapy    Pulmonary embolism (HCC) 10/05/2021   acute   Respiratory failure (HCC) 10/05/2021   acute   Rheumatoid arthritis (HCC)     PAST SURGICAL HISTORY :   Past Surgical History:  Procedure Laterality Date   BREAST BIOPSY Left 11/07/2020   u/s bx-hydromark #3 "coil"-path pending   CARDIAC CATHETERIZATION  09/2004   [facility]; EF 60%   CARDIAC CATHETERIZATION  08/2004   [facility]   IR FLUORO GUIDED NEEDLE PLC ASPIRATION/INJECTION LOC  06/18/2018   PERIPHERAL VASCULAR CATHETERIZATION N/A 07/04/2015   Procedure: Shelda Pal Cath Insertion;  Surgeon: Annice Needy, MD;  Location: [facility] INVASIVE CV LAB;  Service: Cardiovascular;  Laterality: N/A;   PORTA CATH REMOVAL N/A 06/23/2018   Procedure: PORTA CATH REMOVAL;  Surgeon: Annice Needy, MD;  Location: [facility] INVASIVE CV LAB;  Service: Cardiovascular;  Laterality: N/A;   RIGHT/LEFT HEART CATH AND CORONARY ANGIOGRAPHY Bilateral 09/20/2019   Procedure: RIGHT/LEFT HEART CATH AND CORONARY ANGIOGRAPHY;  Surgeon: Yvonne Kendall, MD;  Location: [facility] INVASIVE CV LAB;  Service: Cardiovascular;  Laterality: Bilateral;    FAMILY HISTORY :   Family History  Problem Relation Age of Onset   Diabetes Mother    Cholelithiasis Mother    Hypertension Sister    Diabetes Sister    Heart murmur Sister    Arthritis Brother    Breast cancer Neg Hx     SOCIAL HISTORY:   Social History   Tobacco Use   Smoking status: Never   Smokeless tobacco: Never  Vaping Use   Vaping Use: Never used  Substance Use Topics   Alcohol use: No   Drug use: No    ALLERGIES:  has No Known Allergies.  MEDICATIONS:  Current Outpatient Medications  Medication Sig Dispense Refill   acetaminophen (TYLENOL) 500 MG tablet Take 1,000 mg by mouth every 6 (six) hours as needed for mild pain.      albuterol (VENTOLIN HFA) 108 (90 Base) MCG/ACT inhaler 2 puffs inhaled orally every 4 to 6 hours as needed, not to exceed 12 puffs in 24 hours 18  each 1   apixaban (ELIQUIS) 5 MG TABS tablet Take 1 tablet (5 mg total) by mouth 2 (two) times daily. 60 tablet 2   carvedilol (COREG) 3.125 MG tablet Take 3.125 mg by mouth 2 (two) times daily.     fluticasone (FLONASE) 50 MCG/ACT nasal spray PLACE 2 SPRAYS INTO BOTH NOSTRILS DAILY AS NEEDED FOR ALLERGIES. 48 mL 1   furosemide (LASIX) 40 MG tablet Take 40 mg by mouth daily.     ipratropium-albuterol (DUONEB) 0.5-2.5 (3) MG/3ML SOLN Take 3 mLs by nebulization every 6 (six) hours as needed. 1080 mL 0   MEGARED OMEGA-3 KRILL OIL PO Take 1 tablet by mouth daily.     meloxicam (MOBIC) 7.5 MG tablet TAKE 1 TABLET BY MOUTH EVERY DAY AS NEEDED FOR PAIN 30 tablet 1   Multiple Vitamins-Minerals (CENTRUM SILVER 50+WOMEN) TABS Take 1 tablet by mouth daily.     rosuvastatin (CRESTOR) 5 MG tablet TAKE 1 TABLET (5 MG TOTAL) BY MOUTH DAILY. 90 tablet 3   feeding supplement (ENSURE ENLIVE / ENSURE PLUS) LIQD Take 237 mLs by mouth 3 (three) times  daily between meals. (Patient not taking: Reported on 11/22/2021) 237 mL 12   No current facility-administered medications for this visit.   Facility-Administered Medications Ordered in Other Visits  Medication Dose Route Frequency Provider Last Rate Last Admin   heparin lock flush 100 unit/mL  500 Units Intravenous Once Louretta Shorten R, MD       sodium chloride flush (NS) 0.9 % injection 10 mL  10 mL Intravenous Once Louretta Shorten R, MD       sodium chloride flush (NS) 0.9 % injection 10 mL  10 mL Intravenous Once Earna Coder, MD        PHYSICAL EXAMINATION: ECOG PERFORMANCE STATUS: 0 - Asymptomatic  BP 107/67 (BP Location: Left Arm, Patient Position: Sitting, Cuff Size: Large)   Pulse (!) 58   Temp 97.6 F (36.4 C) (Tympanic)   Ht 5\' 7"  (1.702 m)   Wt 252 lb 14.4 oz (114.7 kg)   SpO2 100%   BMI 39.61 kg/m   Filed Weights   11/22/21 1024  Weight: 252 lb 14.4 oz (114.7 kg)    Physical Exam HENT:     Head: Normocephalic and  atraumatic.     Mouth/Throat:     Pharynx: No oropharyngeal exudate.  Eyes:     Pupils: Pupils are equal, round, and reactive to light.  Cardiovascular:     Rate and Rhythm: Normal rate and regular rhythm.  Pulmonary:     Effort: No respiratory distress.     Breath sounds: No wheezing.  Abdominal:     General: Bowel sounds are normal. There is no distension.     Palpations: Abdomen is soft. There is no mass.     Tenderness: There is no abdominal tenderness. There is no guarding or rebound.  Musculoskeletal:        General: No tenderness. Normal range of motion.     Cervical back: Normal range of motion and neck supple.  Skin:    General: Skin is warm.  Neurological:     Mental Status: She is alert and oriented to person, place, and time.  Psychiatric:        Mood and Affect: Affect normal.    LABORATORY DATA:  I have reviewed the data as listed    Component Value Date/Time   NA 138 11/22/2021 1004   NA 145 (H) 09/08/2019 1359   NA 142 09/14/2013 1127   K 4.3 11/22/2021 1004   K 3.5 09/14/2013 1127   CL 103 11/22/2021 1004   CL 110 (H) 09/14/2013 1127   CO2 30 11/22/2021 1004   CO2 29 09/14/2013 1127   GLUCOSE 102 (H) 11/22/2021 1004   GLUCOSE 106 (H) 09/14/2013 1127   BUN 16 11/22/2021 1004   BUN 15 09/08/2019 1359   BUN 8 09/14/2013 1127   CREATININE 0.64 11/22/2021 1004   CREATININE 0.71 09/14/2013 1127   CREATININE 0.68 04/01/2013 1550   CALCIUM 9.0 11/22/2021 1004   CALCIUM 8.6 09/14/2013 1127   PROT 6.3 (L) 11/22/2021 1004   PROT 7.6 09/14/2013 1127   ALBUMIN 3.4 (L) 11/22/2021 1004   ALBUMIN 3.2 (L) 09/14/2013 1127   AST 23 11/22/2021 1004   AST 22 09/14/2013 1127   ALT 18 11/22/2021 1004   ALT 19 09/14/2013 1127   ALKPHOS 46 11/22/2021 1004   ALKPHOS 76 09/14/2013 1127   BILITOT 0.8 11/22/2021 1004   BILITOT 0.4 09/14/2013 1127   GFRNONAA >60 11/22/2021 1004   GFRNONAA >60 09/14/2013 1127   GFRAA >  60 01/20/2020 1303   GFRAA >60 09/14/2013 1127     No results found for: "SPEP", "UPEP"  Lab Results  Component Value Date   WBC 8.8 11/22/2021   NEUTROABS 6.7 11/22/2021   HGB 12.8 11/22/2021   HCT 41.5 11/22/2021   MCV 96.1 11/22/2021   PLT 175 11/22/2021      Chemistry      Component Value Date/Time   NA 138 11/22/2021 1004   NA 145 (H) 09/08/2019 1359   NA 142 09/14/2013 1127   K 4.3 11/22/2021 1004   K 3.5 09/14/2013 1127   CL 103 11/22/2021 1004   CL 110 (H) 09/14/2013 1127   CO2 30 11/22/2021 1004   CO2 29 09/14/2013 1127   BUN 16 11/22/2021 1004   BUN 15 09/08/2019 1359   BUN 8 09/14/2013 1127   CREATININE 0.64 11/22/2021 1004   CREATININE 0.71 09/14/2013 1127   CREATININE 0.68 04/01/2013 1550      Component Value Date/Time   CALCIUM 9.0 11/22/2021 1004   CALCIUM 8.6 09/14/2013 1127   ALKPHOS 46 11/22/2021 1004   ALKPHOS 76 09/14/2013 1127   AST 23 11/22/2021 1004   AST 22 09/14/2013 1127   ALT 18 11/22/2021 1004   ALT 19 09/14/2013 1127   BILITOT 0.8 11/22/2021 1004   BILITOT 0.4 09/14/2013 1127       RADIOGRAPHIC STUDIES: I have personally reviewed the radiological images as listed and agreed with the findings in the report. No results found.   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    # Currently on Calequence 100 mg twice a day [ AUG 1st, 2022].  DISContinued Rituximab-given the multifocal pneumonia [see below]   #Multifocal pneumonia-[April 2023]-s/p antibiotics improved.  Recent CT scan MAY 2023- [pul, [place]]-improved.  Given the multiple pneumonias/high risk of death-recommended continuation of further rituximab. April 2023- IgG- 652.   # NICMP- [35-40%%- July 28th 2021]--  EVAL; NO CRT [Dr.End/Dr.Klein]July 2022-  2D echo 40 to 45% ejection fraction- STABLE; ?  continue lasix 20 mg/day [sec to dizzy spells]; on coreg- defer to cardiology   #Chronic respiratory failure -2 L home O2 -NICMP/pulmonary hypertension/chronic bilateral lung scarring-followed by pulmonary [[place]]- STABLE.   # Left shoulder pain/ Chronic arthritis-on meloxicam as needed- STABLE.   fridays pt pref-  # DISPOSITION:  # follow up-MD in 4 weeks/Thursday [ MD ;labs- cbc/cmp/ldh/BNP]- ;.Dr.B              Orders Placed This Encounter  Procedures   Comprehensive metabolic panel    Standing Status:   Future    Standing Expiration Date:   11/23/2022   Lactate dehydrogenase    Standing Status:   Future    Standing Expiration Date:   11/23/2022   Brain natriuretic peptide    Standing Status:   Future    Standing Expiration Date:   11/23/2022     All questions were answered. The patient knows to call the clinic with any problems, questions or concerns.      Earna Coder, MD 11/22/2021 12:40 PM

## 2021-11-25 ENCOUNTER — Ambulatory Visit: Payer: Medicare HMO | Admitting: Family

## 2021-11-25 ENCOUNTER — Other Ambulatory Visit: Payer: Self-pay | Admitting: Internal Medicine

## 2021-11-25 ENCOUNTER — Other Ambulatory Visit (HOSPITAL_COMMUNITY): Payer: Self-pay

## 2021-11-25 DIAGNOSIS — C8311 Mantle cell lymphoma, lymph nodes of head, face, and neck: Secondary | ICD-10-CM

## 2021-11-25 MED ORDER — CALQUENCE 100 MG PO TABS
100.0000 mg | ORAL_TABLET | Freq: Two times a day (BID) | ORAL | 2 refills | Status: DC
  Filled 2021-11-25: qty 60, 30d supply, fill #0
  Filled 2021-12-17: qty 60, 30d supply, fill #1
  Filled 2022-01-10: qty 60, 30d supply, fill #2

## 2021-11-27 ENCOUNTER — Encounter: Payer: Self-pay | Admitting: Family

## 2021-11-27 ENCOUNTER — Ambulatory Visit (INDEPENDENT_AMBULATORY_CARE_PROVIDER_SITE_OTHER): Payer: Medicare HMO | Admitting: Family

## 2021-11-27 ENCOUNTER — Other Ambulatory Visit (HOSPITAL_COMMUNITY): Payer: Self-pay

## 2021-11-27 VITALS — BP 128/70 | HR 78 | Temp 97.7°F | Ht 67.0 in | Wt 255.0 lb

## 2021-11-27 DIAGNOSIS — I5022 Chronic systolic (congestive) heart failure: Secondary | ICD-10-CM | POA: Diagnosis not present

## 2021-11-27 DIAGNOSIS — Z1231 Encounter for screening mammogram for malignant neoplasm of breast: Secondary | ICD-10-CM

## 2021-11-27 DIAGNOSIS — I2699 Other pulmonary embolism without acute cor pulmonale: Secondary | ICD-10-CM | POA: Diagnosis not present

## 2021-11-27 NOTE — Assessment & Plan Note (Signed)
Symptomatically stable. Continue carvedilol 3.125 mg twice daily, lasix '40mg'$  qd. Following with Dr End, will follow.

## 2021-11-27 NOTE — Patient Instructions (Addendum)
Please call  and schedule your 3D mammogram and /or bone density scan as we discussed.   [facility]  ( new location in 2023)  [address], [place], [place] [address]  [place], [place]  Stop meloxicam ( Mobic) since you are eliquis and there is drug interaction.   You may also try salon pas pain patch , heat.   As discussed, let's start by scheduling Tylenol Arthritis which is a '650mg'$  tablet .   You may take 1-2 tablets every 8 hours ( scheduled) with maximum of 6 tablets per day.   For example , you could take two tablets in the morning ( 8am) and then two tablets again at 4pm.   Maximum daily dose of acetaminophen 4 g per day from all sources.  If you are taking another medication which includes acetaminophen (Tylenol) which may be in cough and cold preparations or pain medication such as Percocet, you will need to factor that into your total daily dose to be safe.  Please let me know if any questions

## 2021-11-27 NOTE — Progress Notes (Signed)
Subjective:    Patient ID: Michelle [name], female    DOB: 09/25/1940, 81 y.o.   MRN: [id number]  CC: Michelle [name] is a 81 y.o. female who presents today for follow up.   HPI: Accompanied by sister Feels well today No new complaints    She is compliant with 3L [place] as needed with activity.  SOB at baseline with activity such as further distances in the home.   No coughing, leg swelling, orthopnea. She remains on eliquis '5mg'$  bid. Bruising. No bleeding  She is compliant with carvedilol 3.125 mg twice daily, lasix '40mg'$  qd.  She is no longer on prednisone.   Echocardiogram 08/31/2021 shows left ventricular ejection fraction 60 to 65%.  Moderate left ventricular hypertrophy.  Grade 1 diastolic dysfunction Appointment in August with cardiology, Dr. Saunders Revel She continues to follow with oncology, Dr Burlene Arnt for mantle cell lymphoma   CT chest 09/2021 improved.   Following with Dr Darien Ramus, last visit 11/19/21 regarding chronic hypoxic respiratory failure, restrictive lung disease. Leans toward anticoagulation due to persistent risk factor for VTE ( malignancy)   HISTORY:  Past Medical History:  Diagnosis Date   Arthritis    Collagen vascular disease ([place])    GERD (gastroesophageal reflux disease)    Headache(784.0)    HFrEF (heart failure with reduced ejection fraction) (HCC)    Nonischemic cardiomyopathy, LVEF as low as 30-35% in 08/2019   History of methotrexate therapy    Hyperlipidemia    hx   Lymphadenopathy of head and neck 01/2015   see on Thyroid ultrasound   Lymphoma, mantle cell ([place]) 06/01/2015   bx of lymph node in right breast/Stage IV Mantle Cell Lymphoma   Multifocal pneumonia 10/05/2021   Personal history of chemotherapy    Pulmonary embolism ([place]) 10/05/2021   acute   Respiratory failure ([place]) 10/05/2021   acute   Rheumatoid arthritis ([place])    Past Surgical History:  Procedure Laterality Date   BREAST BIOPSY Left 11/07/2020   u/s bx-hydromark #3 "coil"-path  pending   CARDIAC CATHETERIZATION  09/2004   [facility]; EF 60%   CARDIAC CATHETERIZATION  08/2004   [facility]   IR FLUORO GUIDED NEEDLE PLC ASPIRATION/INJECTION LOC  06/18/2018   PERIPHERAL VASCULAR CATHETERIZATION N/A 07/04/2015   Procedure: [name] Insertion;  Surgeon: [name], MD;  Location: [place] CV LAB;  Service: Cardiovascular;  Laterality: N/A;   PORTA CATH REMOVAL N/A 06/23/2018   Procedure: PORTA CATH REMOVAL;  Surgeon: [name], MD;  Location: [place] CV LAB;  Service: Cardiovascular;  Laterality: N/A;   RIGHT/LEFT HEART CATH AND CORONARY ANGIOGRAPHY Bilateral 09/20/2019   Procedure: RIGHT/LEFT HEART CATH AND CORONARY ANGIOGRAPHY;  Surgeon: Nelva Bush, MD;  Location: [place] CV LAB;  Service: Cardiovascular;  Laterality: Bilateral;   Family History  Problem Relation Age of Onset   Diabetes Mother    Cholelithiasis Mother    Hypertension Sister    Diabetes Sister    Heart murmur Sister    Arthritis Brother    Breast cancer Neg Hx     Allergies: Patient has no known allergies. Current Outpatient Medications on File Prior to Visit  Medication Sig Dispense Refill   acalabrutinib maleate (CALQUENCE) 100 MG TABS Take 1 tablet (100 mg) by mouth in the morning and at bedtime. 60 tablet 2   acetaminophen (TYLENOL) 500 MG tablet Take 1,000 mg by mouth every 6 (six) hours as needed for mild pain.      albuterol (VENTOLIN  HFA) 108 (90 Base) MCG/ACT inhaler 2 puffs inhaled orally every 4 to 6 hours as needed, not to exceed 12 puffs in 24 hours 18 each 1   apixaban (ELIQUIS) 5 MG TABS tablet Take 1 tablet (5 mg total) by mouth 2 (two) times daily. 60 tablet 2   carvedilol (COREG) 3.125 MG tablet Take 3.125 mg by mouth 2 (two) times daily.     fluticasone (FLONASE) 50 MCG/ACT nasal spray PLACE 2 SPRAYS INTO BOTH NOSTRILS DAILY AS NEEDED FOR ALLERGIES. 48 mL 1   furosemide (LASIX) 40 MG tablet Take 40 mg by mouth daily.     ipratropium-albuterol (DUONEB) 0.5-2.5 (3)  MG/3ML SOLN Take 3 mLs by nebulization every 6 (six) hours as needed. 1080 mL 0   MEGARED OMEGA-3 KRILL OIL PO Take 1 tablet by mouth daily.     Multiple Vitamins-Minerals (CENTRUM SILVER 50+WOMEN) TABS Take 1 tablet by mouth daily.     rosuvastatin (CRESTOR) 5 MG tablet TAKE 1 TABLET (5 MG TOTAL) BY MOUTH DAILY. 90 tablet 3   feeding supplement (ENSURE ENLIVE / ENSURE PLUS) LIQD Take 237 mLs by mouth 3 (three) times daily between meals. (Patient not taking: Reported on 11/22/2021) 237 mL 12   Current Facility-Administered Medications on File Prior to Visit  Medication Dose Route Frequency Provider Last Rate Last Admin   heparin lock flush 100 unit/mL  500 Units Intravenous Once Charlaine Dalton R, MD       sodium chloride flush (NS) 0.9 % injection 10 mL  10 mL Intravenous Once Charlaine Dalton R, MD       sodium chloride flush (NS) 0.9 % injection 10 mL  10 mL Intravenous Once Cammie Sickle, MD        Social History   Tobacco Use   Smoking status: Never   Smokeless tobacco: Never  Vaping Use   Vaping Use: Never used  Substance Use Topics   Alcohol use: No   Drug use: No    Review of Systems  Constitutional:  Negative for chills and fever.  Respiratory:  Positive for shortness of breath. Negative for cough and wheezing.   Cardiovascular:  Negative for chest pain and palpitations.  Gastrointestinal:  Negative for nausea and vomiting.      Objective:    BP 128/70 (BP Location: Right Arm, Patient Position: Sitting, Cuff Size: Normal)   Pulse 78   Temp 97.7 F (36.5 C) (Oral)   Ht '5\' 7"'$  (1.702 m)   Wt 255 lb (115.7 kg)   SpO2 95%   BMI 39.94 kg/m  BP Readings from Last 3 Encounters:  11/27/21 128/70  11/22/21 107/67  11/19/21 126/64   Wt Readings from Last 3 Encounters:  11/27/21 255 lb (115.7 kg)  11/22/21 252 lb 14.4 oz (114.7 kg)  11/19/21 252 lb 12.8 oz (114.7 kg)    Physical Exam Vitals reviewed.  Constitutional:      Appearance: She is  well-developed.  Eyes:     Conjunctiva/sclera: Conjunctivae normal.  Cardiovascular:     Rate and Rhythm: Normal rate and regular rhythm.     Pulses: Normal pulses.     Heart sounds: Normal heart sounds.  Pulmonary:     Effort: Pulmonary effort is normal.     Breath sounds: Normal breath sounds. No wheezing, rhonchi or rales.  Musculoskeletal:     Right lower leg: No edema.     Left lower leg: No edema.  Skin:    General: Skin is warm and dry.  Neurological:     Mental Status: She is alert.  Psychiatric:        Speech: Speech normal.        Behavior: Behavior normal.        Thought Content: Thought content normal.        Assessment & Plan:   Problem List Items Addressed This Visit       Cardiovascular and Mediastinum   Acute pulmonary embolism (HCC)    Chronic, stable. Continue eliquis '5mg'$  BID per Dr Darien Ramus Leans toward anticoagulation for one year due to persistent risk factor for VTE ( malignancy) . Advised again to discontinue mobic due to drug interaction and advised tylenol prn as needed for pain. She verbalized understanding.        Chronic HFrEF (heart failure with reduced ejection fraction) (HCC)    Symptomatically stable. Continue carvedilol 3.125 mg twice daily, lasix '40mg'$  qd. Following with Dr End, will follow.       Other Visit Diagnoses     Encounter for screening mammogram for malignant neoplasm of breast    -  Primary   Relevant Orders   MM 3D SCREEN BREAST BILATERAL        I have discontinued [place]. [place]'s meloxicam. I am also having her maintain her acetaminophen, MEGARED OMEGA-3 KRILL OIL PO, Centrum Silver 50+Women, fluticasone, rosuvastatin, apixaban, feeding supplement, ipratropium-albuterol, albuterol, carvedilol, furosemide, and Calquence.   No orders of the defined types were placed in this encounter.   Return precautions given.   Risks, benefits, and alternatives of the medications and treatment plan prescribed today were  discussed, and patient expressed understanding.   Education regarding symptom management and diagnosis given to patient on AVS.  Continue to follow with Burnard Hawthorne, FNP for routine health maintenance.   [name] and I agreed with plan.   Mable Paris, FNP

## 2021-11-27 NOTE — Assessment & Plan Note (Addendum)
Chronic, stable. Continue eliquis '5mg'$  BID per Dr Darien Ramus Leans toward anticoagulation for one year due to persistent risk factor for VTE ( malignancy) . Advised again to discontinue mobic due to drug interaction and advised tylenol prn as needed for pain. She verbalized understanding.

## 2021-11-28 ENCOUNTER — Other Ambulatory Visit (HOSPITAL_COMMUNITY): Payer: Self-pay

## 2021-12-12 ENCOUNTER — Other Ambulatory Visit (HOSPITAL_COMMUNITY): Payer: Self-pay

## 2021-12-17 ENCOUNTER — Other Ambulatory Visit (HOSPITAL_COMMUNITY): Payer: Self-pay

## 2021-12-18 ENCOUNTER — Other Ambulatory Visit: Payer: Self-pay | Admitting: Internal Medicine

## 2021-12-19 ENCOUNTER — Inpatient Hospital Stay: Payer: Medicare HMO | Attending: Internal Medicine

## 2021-12-19 ENCOUNTER — Encounter: Payer: Self-pay | Admitting: Internal Medicine

## 2021-12-19 ENCOUNTER — Inpatient Hospital Stay (HOSPITAL_BASED_OUTPATIENT_CLINIC_OR_DEPARTMENT_OTHER): Payer: Medicare HMO | Admitting: Internal Medicine

## 2021-12-19 DIAGNOSIS — J9610 Chronic respiratory failure, unspecified whether with hypoxia or hypercapnia: Secondary | ICD-10-CM | POA: Insufficient documentation

## 2021-12-19 DIAGNOSIS — M25512 Pain in left shoulder: Secondary | ICD-10-CM | POA: Insufficient documentation

## 2021-12-19 DIAGNOSIS — Z9981 Dependence on supplemental oxygen: Secondary | ICD-10-CM | POA: Insufficient documentation

## 2021-12-19 DIAGNOSIS — C8311 Mantle cell lymphoma, lymph nodes of head, face, and neck: Secondary | ICD-10-CM

## 2021-12-19 DIAGNOSIS — R0602 Shortness of breath: Secondary | ICD-10-CM | POA: Insufficient documentation

## 2021-12-19 DIAGNOSIS — Z79899 Other long term (current) drug therapy: Secondary | ICD-10-CM | POA: Diagnosis not present

## 2021-12-19 LAB — COMPREHENSIVE METABOLIC PANEL
ALT: 12 U/L (ref 0–44)
AST: 24 U/L (ref 15–41)
Albumin: 3.6 g/dL (ref 3.5–5.0)
Alkaline Phosphatase: 61 U/L (ref 38–126)
Anion gap: 6 (ref 5–15)
BUN: 12 mg/dL (ref 8–23)
CO2: 29 mmol/L (ref 22–32)
Calcium: 9 mg/dL (ref 8.9–10.3)
Chloride: 101 mmol/L (ref 98–111)
Creatinine, Ser: 0.75 mg/dL (ref 0.44–1.00)
GFR, Estimated: >60 mL/min (ref >60)
Glucose, Bld: 93 mg/dL (ref 70–99)
Potassium: 4.1 mmol/L (ref 3.5–5.1)
Sodium: 136 mmol/L (ref 135–145)
Total Bilirubin: 0.5 mg/dL (ref 0.3–1.2)
Total Protein: 6.5 g/dL (ref 6.5–8.1)

## 2021-12-19 LAB — CBC WITH DIFFERENTIAL/PLATELET
Abs Immature Granulocytes: 0.04 10*3/uL (ref 0.00–0.07)
Basophils Absolute: 0 10*3/uL (ref 0.0–0.1)
Basophils Relative: 0 %
Eosinophils Absolute: 0.1 10*3/uL (ref 0.0–0.5)
Eosinophils Relative: 2 %
HCT: 42.6 % (ref 36.0–46.0)
Hemoglobin: 13.2 g/dL (ref 12.0–15.0)
Immature Granulocytes: 1 %
Lymphocytes Relative: 21 %
Lymphs Abs: 1.6 10*3/uL (ref 0.7–4.0)
MCH: 30.1 pg (ref 26.0–34.0)
MCHC: 31 g/dL (ref 30.0–36.0)
MCV: 97 fL (ref 80.0–100.0)
Monocytes Absolute: 0.5 10*3/uL (ref 0.1–1.0)
Monocytes Relative: 6 %
Neutro Abs: 5.4 10*3/uL (ref 1.7–7.7)
Neutrophils Relative %: 70 %
Platelets: 217 10*3/uL (ref 150–400)
RBC: 4.39 MIL/uL (ref 3.87–5.11)
RDW: 13.4 % (ref 11.5–15.5)
WBC: 7.7 10*3/uL (ref 4.0–10.5)
nRBC: 0 % (ref 0.0–0.2)

## 2021-12-19 LAB — LACTATE DEHYDROGENASE: LDH: 177 U/L (ref 98–192)

## 2021-12-19 LAB — BRAIN NATRIURETIC PEPTIDE: B Natriuretic Peptide: 45.9 pg/mL (ref 0.0–100.0)

## 2021-12-19 NOTE — Assessment & Plan Note (Addendum)
#  Mantle cell lymphoma recurrent biopsy-proven [July 2022]; Currently on Calquence [ DISCONTINUED  Rituximab-given the multifocal pneumonia]-  PET scan February 2023-complete response;  No evidence non-Hodgkin's lymphoma/mantle cell recurrence or progression.   [see below]   # Currently on Calequence 100 mg twice a day [ AUG 1st, 2022].   Multifocal pneumonia-[April 2023]-s/p antibiotics improved.  Recent CT scan MAY 2023- [pul, [place]]-improved.  Given the multiple pneumonias/high risk of death-recommended continuation of further rituximab. April 2023- IgG- 652.   # NICMP- [35-40%%- July 28th 2021]--  EVAL; NO CRT [Dr.End/Dr.Klein]July 2022- 2D echo 40 to 45% ejection fraction- STABLE; ?  continue lasix 20 mg/day [sec to dizzy spells]; on coreg- defer to cardiology- STABLE.   #Chronic respiratory failure -2 L home O2 -NICMP/pulmonary hypertension/chronic bilateral lung scarring-followed by pulmonary [[place]]- STABLE.   # Left shoulder pain/ Chronic arthritis-on meloxicam as needed- STABLE.   fridays pt pref-  # DISPOSITION:  # follow up-MD in 4 weeks//friday [ MD ;labs- cbc/cmp/ldh/BNP]- ;.Dr.B

## 2021-12-19 NOTE — Progress Notes (Signed)
Having some numbness in her rt elbow down into arm and fingers.

## 2021-12-19 NOTE — Progress Notes (Signed)
[place] OFFICE PROGRESS NOTE  Patient Care Team: Burnard Hawthorne, FNP as PCP - General (Family Medicine) End, Harrell Gave, MD as PCP - Cardiology (Cardiology) Deboraha Sprang, MD as PCP - Electrophysiology (Cardiology) Jackolyn Confer, MD (Internal Medicine) Cammie Sickle, MD as Consulting Physician (Internal Medicine)   Cancer Staging  No matching staging information was found for the patient.   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 Feb 9th- START Benda-Ritux with neulasta; Prolonged neutropenia; DISCONT- Benda-Ritux;   # April 13 th 2017- START R-CHOP x1; severe/prolonged neutropenia; PET- CR; BMBx-Neg; Disc R-CHOP # June 2022- DIAGNOSIS: thickened cortex of 12 mm. An additional lymph node demonstrates a thickened cortex of 7 mm. A third lymph node demonstrates a mildly thickened cortex of 4 mm A. LYMPH NODE, LEFT AXILLA; ULTRASOUND-GUIDED BIOPSY:  - CD5+ MONOCLONAL B-CELL POPULATION; COMPATIBLE WITH INVOLVEMENT BY THE  PATIENT'S KNOWN MANTLE CELL LYMPHOMA.   #July 2022-second week-started ibrutinib 420 mg a dayx 1 week-stop because of severe rash.  Significant clinical response noted.  # # 26th MAY 2017- Start Rituxan q 26M [place] OCT 12th 2017- PET NED.    # AUG 1st, 2022- start acalbrutinib. 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    Michelle [name] 81 y.o.  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  Patient admits compliance to Calquence.  Denies any headaches.  However rituximab is on hold because of multifocal pneumonia/given the concerns for immunosuppression.  Patient continues to be on 2 L of oxygen but as needed.  No further hospitalizations.  Having some numbness in her rt elbow down into arm and fingers.  However this improves after movement of the hand.  Otherwise no worsening constipation or shortness of breath or cough. No swelling in legs.   Review of Systems  Constitutional:  Negative for chills, diaphoresis, fever, malaise/fatigue and weight loss.  HENT:  Negative for nosebleeds and sore throat.   Eyes:  Negative for double vision.  Respiratory:  Negative for hemoptysis, sputum production and wheezing.   Cardiovascular:  Negative for chest pain, palpitations, orthopnea and leg swelling.  Gastrointestinal:  Positive for constipation. Negative for abdominal pain, blood in stool, diarrhea, heartburn, melena, nausea and vomiting.  Genitourinary:  Negative for dysuria, frequency and urgency.  Musculoskeletal:  Positive for back pain and joint pain.  Skin: Negative.  Negative for itching and rash.  Neurological:  Negative for dizziness, tingling, focal weakness, weakness and headaches.  Endo/Heme/Allergies:  Does not bruise/bleed easily.  Psychiatric/Behavioral:  Negative for depression. The patient is not nervous/anxious and does not have insomnia.     PAST MEDICAL HISTORY :  Past Medical History:  Diagnosis Date  . Arthritis   . Collagen vascular disease ([place])   . GERD (gastroesophageal reflux disease)   . Headache(784.0)   .  HFrEF (heart failure with reduced ejection fraction) (HCC)    Nonischemic cardiomyopathy, LVEF as low as 30-35% in 08/2019  . History of methotrexate therapy   . Hyperlipidemia    hx  . Lymphadenopathy of  head and neck 01/2015   see on Thyroid ultrasound  . Lymphoma, mantle cell ([place]) 06/01/2015   bx of lymph node in right breast/Stage IV Mantle Cell Lymphoma  . Multifocal pneumonia 10/05/2021  . Personal history of chemotherapy   . Pulmonary embolism ([place]) 10/05/2021   acute  . Respiratory failure ([place]) 10/05/2021   acute  . Rheumatoid arthritis ([place])     PAST SURGICAL HISTORY :   Past Surgical History:  Procedure Laterality Date  . BREAST BIOPSY Left 11/07/2020   u/s bx-hydromark #3 "coil"-path pending  . CARDIAC CATHETERIZATION  09/2004   [facility]; EF 60%  . CARDIAC CATHETERIZATION  08/2004   [facility]  . IR FLUORO GUIDED NEEDLE PLC ASPIRATION/INJECTION LOC  06/18/2018  . PERIPHERAL VASCULAR CATHETERIZATION N/A 07/04/2015   Procedure: [name] Insertion;  Surgeon: [name], MD;  Location: [facility];  Service: Cardiovascular;  Laterality: N/A;  . PORTA CATH REMOVAL N/A 06/23/2018   Procedure: PORTA CATH REMOVAL;  Surgeon: [name], MD;  Location: [facility];  Service: Cardiovascular;  Laterality: N/A;  . RIGHT/LEFT HEART CATH AND CORONARY ANGIOGRAPHY Bilateral 09/20/2019   Procedure: RIGHT/LEFT HEART CATH AND CORONARY ANGIOGRAPHY;  Surgeon: Nelva Bush, MD;  Location: [facility];  Service: Cardiovascular;  Laterality: Bilateral;    FAMILY HISTORY :   Family History  Problem Relation Age of Onset  . Diabetes Mother   . Cholelithiasis Mother   . Hypertension Sister   . Diabetes Sister   . Heart murmur Sister   . Arthritis Brother   . Breast cancer Neg Hx     SOCIAL HISTORY:   Social History   Tobacco Use  . Smoking status: Never  . Smokeless tobacco: Never  Vaping Use  . Vaping Use: Never used  Substance Use Topics  . Alcohol use: No  . Drug use: No    ALLERGIES:  has No Known Allergies.  MEDICATIONS:  Current Outpatient Medications  Medication Sig Dispense Refill  . acalabrutinib maleate (CALQUENCE) 100 MG TABS Take 1 tablet  (100 mg) by mouth in the morning and at bedtime. 60 tablet 2  . acetaminophen (TYLENOL) 500 MG tablet Take 1,000 mg by mouth every 6 (six) hours as needed for mild pain.     [name] albuterol (VENTOLIN HFA) 108 (90 Base) MCG/ACT inhaler 2 puffs inhaled orally every 4 to 6 hours as needed, not to exceed 12 puffs in 24 hours 18 each 1  . apixaban (ELIQUIS) 5 MG TABS tablet Take 1 tablet (5 mg total) by mouth 2 (two) times daily. 60 tablet 2  . carvedilol (COREG) 3.125 MG tablet Take 3.125 mg by mouth 2 (two) times daily.    . fluticasone (FLONASE) 50 MCG/ACT nasal spray PLACE 2 SPRAYS INTO BOTH NOSTRILS DAILY AS NEEDED FOR ALLERGIES. 48 mL 1  . furosemide (LASIX) 40 MG tablet Take 40 mg by mouth daily.    [name] ipratropium-albuterol (DUONEB) 0.5-2.5 (3) MG/3ML SOLN Take 3 mLs by nebulization every 6 (six) hours as needed. 1080 mL 0  . MEGARED OMEGA-3 KRILL OIL PO Take 1 tablet by mouth daily.    . Multiple Vitamins-Minerals (CENTRUM SILVER 50+WOMEN) TABS Take 1 tablet by mouth daily.    . rosuvastatin (CRESTOR) 5  MG tablet TAKE 1 TABLET (5 MG TOTAL) BY MOUTH DAILY. 90 tablet 3  . feeding supplement (ENSURE ENLIVE / ENSURE PLUS) LIQD Take 237 mLs by mouth 3 (three) times daily between meals. (Patient not taking: Reported on 11/22/2021) 237 mL 12   No current facility-administered medications for this visit.   Facility-Administered Medications Ordered in Other Visits  Medication Dose Route Frequency Provider Last Rate Last Admin  . heparin lock flush 100 unit/mL  500 Units Intravenous Once Charlaine Dalton R, MD      . sodium chloride flush (NS) 0.9 % injection 10 mL  10 mL Intravenous Once Charlaine Dalton R, MD      . sodium chloride flush (NS) 0.9 % injection 10 mL  10 mL Intravenous Once Cammie Sickle, MD        PHYSICAL EXAMINATION: ECOG PERFORMANCE STATUS: 0 - Asymptomatic  BP 115/72 (BP Location: Left Arm, Patient Position: Sitting, Cuff Size: Normal)   Pulse 97   Temp (!) 97.5 F  (36.4 C) (Tympanic)   Ht '5\' 7"'$  (1.702 m)   Wt 251 lb 12.8 oz (114.2 kg)   SpO2 100%   BMI 39.44 kg/m   Filed Weights   12/19/21 1010  Weight: 251 lb 12.8 oz (114.2 kg)    Physical Exam HENT:     Head: Normocephalic and atraumatic.     Mouth/Throat:     Pharynx: No oropharyngeal exudate.  Eyes:     Pupils: Pupils are equal, round, and reactive to light.  Cardiovascular:     Rate and Rhythm: Normal rate and regular rhythm.  Pulmonary:     Effort: No respiratory distress.     Breath sounds: No wheezing.  Abdominal:     General: Bowel sounds are normal. There is no distension.     Palpations: Abdomen is soft. There is no mass.     Tenderness: There is no abdominal tenderness. There is no guarding or rebound.  Musculoskeletal:        General: No tenderness. Normal range of motion.     Cervical back: Normal range of motion and neck supple.  Skin:    General: Skin is warm.  Neurological:     Mental Status: She is alert and oriented to person, place, and time.  Psychiatric:        Mood and Affect: Affect normal.   LABORATORY DATA:  I have reviewed the data as listed    Component Value Date/Time   NA 136 12/19/2021 1005   NA 145 (H) 09/08/2019 1359   NA 142 09/14/2013 1127   K 4.1 12/19/2021 1005   K 3.5 09/14/2013 1127   CL 101 12/19/2021 1005   CL 110 (H) 09/14/2013 1127   CO2 29 12/19/2021 1005   CO2 29 09/14/2013 1127   GLUCOSE 93 12/19/2021 1005   GLUCOSE 106 (H) 09/14/2013 1127   BUN 12 12/19/2021 1005   BUN 15 09/08/2019 1359   BUN 8 09/14/2013 1127   CREATININE 0.75 12/19/2021 1005   CREATININE 0.71 09/14/2013 1127   CREATININE 0.68 04/01/2013 1550   CALCIUM 9.0 12/19/2021 1005   CALCIUM 8.6 09/14/2013 1127   PROT 6.5 12/19/2021 1005   PROT 7.6 09/14/2013 1127   ALBUMIN 3.6 12/19/2021 1005   ALBUMIN 3.2 (L) 09/14/2013 1127   AST 24 12/19/2021 1005   AST 22 09/14/2013 1127   ALT 12 12/19/2021 1005   ALT 19 09/14/2013 1127   ALKPHOS 61 12/19/2021  1005   ALKPHOS 76  09/14/2013 1127   BILITOT 0.5 12/19/2021 1005   BILITOT 0.4 09/14/2013 1127   GFRNONAA >60 12/19/2021 1005   GFRNONAA >60 09/14/2013 1127   GFRAA >60 01/20/2020 1303   GFRAA >60 09/14/2013 1127    No results found for: "SPEP", "UPEP"  Lab Results  Component Value Date   WBC 7.7 12/19/2021   NEUTROABS 5.4 12/19/2021   HGB 13.2 12/19/2021   HCT 42.6 12/19/2021   MCV 97.0 12/19/2021   PLT 217 12/19/2021      Chemistry      Component Value Date/Time   NA 136 12/19/2021 1005   NA 145 (H) 09/08/2019 1359   NA 142 09/14/2013 1127   K 4.1 12/19/2021 1005   K 3.5 09/14/2013 1127   CL 101 12/19/2021 1005   CL 110 (H) 09/14/2013 1127   CO2 29 12/19/2021 1005   CO2 29 09/14/2013 1127   BUN 12 12/19/2021 1005   BUN 15 09/08/2019 1359   BUN 8 09/14/2013 1127   CREATININE 0.75 12/19/2021 1005   CREATININE 0.71 09/14/2013 1127   CREATININE 0.68 04/01/2013 1550      Component Value Date/Time   CALCIUM 9.0 12/19/2021 1005   CALCIUM 8.6 09/14/2013 1127   ALKPHOS 61 12/19/2021 1005   ALKPHOS 76 09/14/2013 1127   AST 24 12/19/2021 1005   AST 22 09/14/2013 1127   ALT 12 12/19/2021 1005   ALT 19 09/14/2013 1127   BILITOT 0.5 12/19/2021 1005   BILITOT 0.4 09/14/2013 1127       RADIOGRAPHIC STUDIES: I have personally reviewed the radiological images as listed and agreed with the findings in the report. No results found.   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   [see below]    # Currently on Calequence 100 mg twice a day [ AUG 1st, 2022].   Multifocal pneumonia-[April 2023]-s/p antibiotics improved.  Recent CT scan MAY 2023- [pul, [place]]-improved.  Given the  multiple pneumonias/high risk of death-recommended continuation of further rituximab. April 2023- IgG- 652.   # NICMP- [35-40%%- July 28th 2021]--  EVAL; NO CRT [Dr.End/Dr.Klein]July 2022- 2D echo 40 to 45% ejection fraction- STABLE; ?  continue lasix 20 mg/day [sec to dizzy spells]; on coreg- defer to cardiology- STABLE.   #Chronic respiratory failure -2 L home O2 -NICMP/pulmonary hypertension/chronic bilateral lung scarring-followed by pulmonary [[place]]- STABLE.   # Left shoulder pain/ Chronic arthritis-on meloxicam as needed- STABLE.   fridays pt pref-  # DISPOSITION:  # follow up-MD in 4 weeks//friday [ MD ;labs- cbc/cmp/ldh/BNP]- ;.Dr.B          Orders Placed This Encounter  Procedures  . Comprehensive metabolic panel    Standing Status:   Future    Standing Expiration Date:   12/20/2022  . Lactate dehydrogenase    Standing Status:   Future    Standing Expiration Date:   12/20/2022  . Brain natriuretic peptide    Standing Status:   Future    Standing Expiration Date:   12/20/2022     All questions were answered. The patient knows to call the clinic with any problems, questions or concerns.      Cammie Sickle, MD 12/19/2021 1:02 PM

## 2021-12-20 ENCOUNTER — Other Ambulatory Visit (HOSPITAL_COMMUNITY): Payer: Self-pay

## 2021-12-23 ENCOUNTER — Other Ambulatory Visit: Payer: Self-pay

## 2021-12-31 ENCOUNTER — Other Ambulatory Visit: Payer: Self-pay

## 2022-01-10 ENCOUNTER — Other Ambulatory Visit: Payer: Self-pay | Admitting: Family

## 2022-01-10 ENCOUNTER — Other Ambulatory Visit (HOSPITAL_COMMUNITY): Payer: Self-pay

## 2022-01-10 DIAGNOSIS — J309 Allergic rhinitis, unspecified: Secondary | ICD-10-CM

## 2022-01-16 ENCOUNTER — Other Ambulatory Visit (HOSPITAL_COMMUNITY): Payer: Self-pay

## 2022-01-16 ENCOUNTER — Other Ambulatory Visit: Payer: Self-pay

## 2022-01-16 DIAGNOSIS — C8311 Mantle cell lymphoma, lymph nodes of head, face, and neck: Secondary | ICD-10-CM

## 2022-01-16 DIAGNOSIS — H524 Presbyopia: Secondary | ICD-10-CM | POA: Diagnosis not present

## 2022-01-17 ENCOUNTER — Inpatient Hospital Stay: Payer: Medicare HMO | Attending: Internal Medicine

## 2022-01-17 ENCOUNTER — Inpatient Hospital Stay (HOSPITAL_BASED_OUTPATIENT_CLINIC_OR_DEPARTMENT_OTHER): Payer: Medicare HMO | Admitting: Internal Medicine

## 2022-01-17 ENCOUNTER — Other Ambulatory Visit: Payer: Self-pay

## 2022-01-17 ENCOUNTER — Encounter: Payer: Self-pay | Admitting: Licensed Clinical Social Worker

## 2022-01-17 ENCOUNTER — Encounter: Payer: Self-pay | Admitting: Internal Medicine

## 2022-01-17 DIAGNOSIS — C8311 Mantle cell lymphoma, lymph nodes of head, face, and neck: Secondary | ICD-10-CM

## 2022-01-17 DIAGNOSIS — Z9981 Dependence on supplemental oxygen: Secondary | ICD-10-CM | POA: Insufficient documentation

## 2022-01-17 DIAGNOSIS — J9610 Chronic respiratory failure, unspecified whether with hypoxia or hypercapnia: Secondary | ICD-10-CM | POA: Insufficient documentation

## 2022-01-17 DIAGNOSIS — Z79899 Other long term (current) drug therapy: Secondary | ICD-10-CM | POA: Diagnosis not present

## 2022-01-17 DIAGNOSIS — M069 Rheumatoid arthritis, unspecified: Secondary | ICD-10-CM | POA: Diagnosis not present

## 2022-01-17 LAB — COMPREHENSIVE METABOLIC PANEL
ALT: 11 U/L (ref 0–44)
AST: 22 U/L (ref 15–41)
Albumin: 3.7 g/dL (ref 3.5–5.0)
Alkaline Phosphatase: 63 U/L (ref 38–126)
Anion gap: 5 (ref 5–15)
BUN: 10 mg/dL (ref 8–23)
CO2: 31 mmol/L (ref 22–32)
Calcium: 9 mg/dL (ref 8.9–10.3)
Chloride: 102 mmol/L (ref 98–111)
Creatinine, Ser: 0.76 mg/dL (ref 0.44–1.00)
GFR, Estimated: >60 mL/min (ref >60)
Glucose, Bld: 107 mg/dL — ABNORMAL HIGH (ref 70–99)
Potassium: 3.7 mmol/L (ref 3.5–5.1)
Sodium: 138 mmol/L (ref 135–145)
Total Bilirubin: 0.7 mg/dL (ref 0.3–1.2)
Total Protein: 6.5 g/dL (ref 6.5–8.1)

## 2022-01-17 LAB — CBC WITH DIFFERENTIAL/PLATELET
Abs Immature Granulocytes: 0.03 10*3/uL (ref 0.00–0.07)
Basophils Absolute: 0 10*3/uL (ref 0.0–0.1)
Basophils Relative: 0 %
Eosinophils Absolute: 0.1 10*3/uL (ref 0.0–0.5)
Eosinophils Relative: 1 %
HCT: 42.7 % (ref 36.0–46.0)
Hemoglobin: 13.3 g/dL (ref 12.0–15.0)
Immature Granulocytes: 1 %
Lymphocytes Relative: 22 %
Lymphs Abs: 1.4 10*3/uL (ref 0.7–4.0)
MCH: 29.6 pg (ref 26.0–34.0)
MCHC: 31.1 g/dL (ref 30.0–36.0)
MCV: 94.9 fL (ref 80.0–100.0)
Monocytes Absolute: 0.5 10*3/uL (ref 0.1–1.0)
Monocytes Relative: 8 %
Neutro Abs: 4.3 10*3/uL (ref 1.7–7.7)
Neutrophils Relative %: 68 %
Platelets: 199 10*3/uL (ref 150–400)
RBC: 4.5 MIL/uL (ref 3.87–5.11)
RDW: 13.6 % (ref 11.5–15.5)
WBC: 6.3 10*3/uL (ref 4.0–10.5)
nRBC: 0 % (ref 0.0–0.2)

## 2022-01-17 LAB — LACTATE DEHYDROGENASE: LDH: 162 U/L (ref 98–192)

## 2022-01-17 LAB — BRAIN NATRIURETIC PEPTIDE: B Natriuretic Peptide: 46.8 pg/mL (ref 0.0–100.0)

## 2022-01-17 MED ORDER — ONDANSETRON HCL 8 MG PO TABS: ORAL_TABLET | ORAL | 1 refills | Status: DC

## 2022-01-17 NOTE — Assessment & Plan Note (Signed)
#  Mantle cell lymphoma recurrent biopsy-proven [July 2022]; Currently on Calquence [ DISCONTINUED  Rituximab-given the multifocal pneumonia]-  PET scan February 2023-complete response;  No evidence non-Hodgkin's lymphoma/mantle cell recurrence or progression.   [see below]   # Currently on Calequence 100 mg twice a day [ AUG 1st, 2022]. Will repeat scan sin 2 months or so.   # Immunocompromised state: Hx Multifocal pneumonia-[April 2023]-Given the multiple pneumonias/high risk of death-recommended continuation of further rituximab. April 2023- IgG- 652. Monitor for now   # NICMP- [35-40%%- July 28th 2021]--  EVAL; NO CRT [Dr.End/Dr.Klein]July 2022- 2D echo 40 to 45% ejection fraction-   continue lasix 20 mg/day [sec to dizzy spells]; on coreg- defer to cardiology- STABLE.   #Chronic respiratory failure -2 L home O2 -NICMP/pulmonary hypertension/chronic bilateral lung scarring-followed by pulmonary [[place]]-  STABLE.   # Left shoulder pain/ Chronic arthritis-on meloxicam as needed- STABLE.   fridays pt pref-  # DISPOSITION:  # refer to social worker re: disability # follow up-MD in 4 weeks//friday [ MD ;labs- cbc/cmp/ldh/BNP; quantitative immunoglobulin]- ;.Dr.B

## 2022-01-17 NOTE — Progress Notes (Signed)
[place] Work  Clinical Social Work was referred by medical provider for assessment of psychosocial needs.  Clinical Social Worker attempted to contact patient by phone  to offer support and assess for needs.  CSW was unable to leave voicemail for patient, the voicemail is not set up.   FA   Adelene Amas, LCSW  Clinical Social Worker [facility]

## 2022-01-17 NOTE — Progress Notes (Unsigned)
[place] OFFICE PROGRESS NOTE  Patient Care Team: Burnard Hawthorne, FNP as PCP - General (Family Medicine) End, Harrell Gave, MD as PCP - Cardiology (Cardiology) Deboraha Sprang, MD as PCP - Electrophysiology (Cardiology) Jackolyn Confer, MD (Internal Medicine) Cammie Sickle, MD as Consulting Physician (Internal Medicine)   Cancer Staging  No matching staging information was found for the patient.   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 Feb 9th- START Benda-Ritux with neulasta; Prolonged neutropenia; DISCONT- Benda-Ritux;   # April 13 th 2017- START R-CHOP x1; severe/prolonged neutropenia; PET- CR; BMBx-Neg; Disc R-CHOP # June 2022- DIAGNOSIS: thickened cortex of 12 mm. An additional lymph node demonstrates a thickened cortex of 7 mm. A third lymph node demonstrates a mildly thickened cortex of 4 mm A. LYMPH NODE, LEFT AXILLA; ULTRASOUND-GUIDED BIOPSY:  - CD5+ MONOCLONAL B-CELL POPULATION; COMPATIBLE WITH INVOLVEMENT BY THE  PATIENT'S KNOWN MANTLE CELL LYMPHOMA.   #July 2022-second week-started ibrutinib 420 mg a dayx 1 week-stop because of severe rash.  Significant clinical response noted.  # # 26th MAY 2017- Start Rituxan q 39M [place] OCT 12th 2017- PET NED.    # AUG 1st, 2022- start acalbrutinib. 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    Michelle [name] 81 y.o.  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  Patient admits compliance to Calquence.  Denies any headaches.Patient continues to be on 2 L of oxygen but as needed.  No further hospitalizations.  No nausea no vomiting.  No diarrhea.  No chest pain no new lumps or bumps.  Review of Systems  Constitutional:  Negative for chills, diaphoresis, fever, malaise/fatigue and weight loss.  HENT:  Negative for nosebleeds and sore throat.   Eyes:  Negative for double vision.  Respiratory:  Negative for hemoptysis, sputum production and wheezing.   Cardiovascular:  Negative for chest pain, palpitations, orthopnea and leg swelling.  Gastrointestinal:  Positive for constipation. Negative for abdominal pain, blood in stool, diarrhea, heartburn, melena, nausea and vomiting.  Genitourinary:  Negative for dysuria, frequency and urgency.  Musculoskeletal:  Positive for back pain and joint pain.  Skin: Negative.  Negative for itching and rash.  Neurological:  Negative for dizziness, tingling, focal weakness, weakness and headaches.  Endo/Heme/Allergies:  Does not bruise/bleed easily.  Psychiatric/Behavioral:  Negative for depression. The patient is not nervous/anxious and does not have insomnia.     PAST MEDICAL HISTORY :  Past Medical History:  Diagnosis Date  . Arthritis   . Collagen vascular disease ([place])   . GERD (gastroesophageal reflux disease)   . Headache(784.0)   . HFrEF (heart failure with reduced ejection fraction) (HCC)    Nonischemic cardiomyopathy, LVEF as low as 30-35% in 08/2019  . History of methotrexate therapy   . Hyperlipidemia    hx  . Lymphadenopathy  of head and neck 01/2015   see on Thyroid ultrasound  . Lymphoma, mantle cell ([place]) 06/01/2015   bx of lymph node in right breast/Stage IV Mantle Cell Lymphoma  . Multifocal pneumonia 10/05/2021  . Personal history of chemotherapy   .  Pulmonary embolism ([place]) 10/05/2021   acute  . Respiratory failure ([place]) 10/05/2021   acute  . Rheumatoid arthritis ([place])     PAST SURGICAL HISTORY :   Past Surgical History:  Procedure Laterality Date  . BREAST BIOPSY Left 11/07/2020   u/s bx-hydromark #3 "coil"-path pending  . CARDIAC CATHETERIZATION  09/2004   [facility]; EF 60%  . CARDIAC CATHETERIZATION  08/2004   [facility]  . IR FLUORO GUIDED NEEDLE PLC ASPIRATION/INJECTION LOC  06/18/2018  . PERIPHERAL VASCULAR CATHETERIZATION N/A 07/04/2015   Procedure: [name] Insertion;  Surgeon: [name], MD;  Location: [facility];  Service: Cardiovascular;  Laterality: N/A;  . PORTA CATH REMOVAL N/A 06/23/2018   Procedure: PORTA CATH REMOVAL;  Surgeon: [name], MD;  Location: [facility];  Service: Cardiovascular;  Laterality: N/A;  . RIGHT/LEFT HEART CATH AND CORONARY ANGIOGRAPHY Bilateral 09/20/2019   Procedure: RIGHT/LEFT HEART CATH AND CORONARY ANGIOGRAPHY;  Surgeon: Nelva Bush, MD;  Location: [facility];  Service: Cardiovascular;  Laterality: Bilateral;    FAMILY HISTORY :   Family History  Problem Relation Age of Onset  . Diabetes Mother   . Cholelithiasis Mother   . Hypertension Sister   . Diabetes Sister   . Heart murmur Sister   . Arthritis Brother   . Breast cancer Neg Hx     SOCIAL HISTORY:   Social History   Tobacco Use  . Smoking status: Never  . Smokeless tobacco: Never  Vaping Use  . Vaping Use: Never used  Substance Use Topics  . Alcohol use: No  . Drug use: No    ALLERGIES:  has No Known Allergies.  MEDICATIONS:  Current Outpatient Medications  Medication Sig Dispense Refill  . acalabrutinib maleate (CALQUENCE) 100 MG TABS Take 1 tablet (100 mg) by mouth in the morning and at bedtime. 60 tablet 2  . acetaminophen (TYLENOL) 500 MG tablet Take 1,000 mg by mouth every 6 (six) hours as needed for mild pain.     [name] albuterol (VENTOLIN HFA) 108 (90 Base) MCG/ACT inhaler 2 puffs  inhaled orally every 4 to 6 hours as needed, not to exceed 12 puffs in 24 hours 18 each 1  . apixaban (ELIQUIS) 5 MG TABS tablet Take 1 tablet (5 mg total) by mouth 2 (two) times daily. 60 tablet 2  . carvedilol (COREG) 3.125 MG tablet Take 3.125 mg by mouth 2 (two) times daily.    . fluticasone (FLONASE) 50 MCG/ACT nasal spray PLACE 2 SPRAYS INTO BOTH NOSTRILS DAILY AS NEEDED FOR ALLERGIES. 48 mL 1  . furosemide (LASIX) 40 MG tablet Take 40 mg by mouth daily.    [name] ipratropium-albuterol (DUONEB) 0.5-2.5 (3) MG/3ML SOLN Take 3 mLs by nebulization every 6 (six) hours as needed. 1080 mL 0  . MEGARED OMEGA-3 KRILL OIL PO Take 1 tablet by mouth daily.    . Multiple Vitamins-Minerals (CENTRUM SILVER 50+WOMEN) TABS Take 1 tablet by mouth daily.    . rosuvastatin (CRESTOR) 5 MG tablet TAKE 1 TABLET (5 MG TOTAL) BY MOUTH DAILY. 90 tablet 3  . feeding supplement (ENSURE ENLIVE / ENSURE PLUS) LIQD Take 237 mLs by mouth 3 (three) times daily between meals. (Patient not  taking: Reported on 11/22/2021) 237 mL 12  . ondansetron (ZOFRAN) 8 MG tablet ONE PILL EVERY 8 HOURS AS NEEDED FOR NAUSEA/VOMITTING. 40 tablet 1   No current facility-administered medications for this visit.   Facility-Administered Medications Ordered in Other Visits  Medication Dose Route Frequency Provider Last Rate Last Admin  . heparin lock flush 100 unit/mL  500 Units Intravenous Once Charlaine Dalton R, MD      . sodium chloride flush (NS) 0.9 % injection 10 mL  10 mL Intravenous Once Charlaine Dalton R, MD      . sodium chloride flush (NS) 0.9 % injection 10 mL  10 mL Intravenous Once Cammie Sickle, MD        PHYSICAL EXAMINATION: ECOG PERFORMANCE STATUS: 0 - Asymptomatic  BP 106/63 (BP Location: Left Arm, Patient Position: Sitting)   Pulse 66   Temp (!) 96.8 F (36 C) (Tympanic)   Resp 18   Wt 251 lb (113.9 kg)   SpO2 98%   BMI 39.31 kg/m   Filed Weights   01/17/22 0900  Weight: 251 lb (113.9 kg)     Physical Exam HENT:     Head: Normocephalic and atraumatic.     Mouth/Throat:     Pharynx: No oropharyngeal exudate.  Eyes:     Pupils: Pupils are equal, round, and reactive to light.  Cardiovascular:     Rate and Rhythm: Normal rate and regular rhythm.  Pulmonary:     Effort: No respiratory distress.     Breath sounds: No wheezing.  Abdominal:     General: Bowel sounds are normal. There is no distension.     Palpations: Abdomen is soft. There is no mass.     Tenderness: There is no abdominal tenderness. There is no guarding or rebound.  Musculoskeletal:        General: No tenderness. Normal range of motion.     Cervical back: Normal range of motion and neck supple.  Skin:    General: Skin is warm.  Neurological:     Mental Status: She is alert and oriented to person, place, and time.  Psychiatric:        Mood and Affect: Affect normal.   LABORATORY DATA:  I have reviewed the data as listed    Component Value Date/Time   NA 138 01/17/2022 0925   NA 145 (H) 09/08/2019 1359   NA 142 09/14/2013 1127   K 3.7 01/17/2022 0925   K 3.5 09/14/2013 1127   CL 102 01/17/2022 0925   CL 110 (H) 09/14/2013 1127   CO2 31 01/17/2022 0925   CO2 29 09/14/2013 1127   GLUCOSE 107 (H) 01/17/2022 0925   GLUCOSE 106 (H) 09/14/2013 1127   BUN 10 01/17/2022 0925   BUN 15 09/08/2019 1359   BUN 8 09/14/2013 1127   CREATININE 0.76 01/17/2022 0925   CREATININE 0.71 09/14/2013 1127   CREATININE 0.68 04/01/2013 1550   CALCIUM 9.0 01/17/2022 0925   CALCIUM 8.6 09/14/2013 1127   PROT 6.5 01/17/2022 0925   PROT 7.6 09/14/2013 1127   ALBUMIN 3.7 01/17/2022 0925   ALBUMIN 3.2 (L) 09/14/2013 1127   AST 22 01/17/2022 0925   AST 22 09/14/2013 1127   ALT 11 01/17/2022 0925   ALT 19 09/14/2013 1127   ALKPHOS 63 01/17/2022 0925   ALKPHOS 76 09/14/2013 1127   BILITOT 0.7 01/17/2022 0925   BILITOT 0.4 09/14/2013 1127   GFRNONAA >60 01/17/2022 0925   GFRNONAA >60 09/14/2013 1127  GFRAA >60  01/20/2020 1303   GFRAA >60 09/14/2013 1127    No results found for: "SPEP", "UPEP"  Lab Results  Component Value Date   WBC 6.3 01/17/2022   NEUTROABS 4.3 01/17/2022   HGB 13.3 01/17/2022   HCT 42.7 01/17/2022   MCV 94.9 01/17/2022   PLT 199 01/17/2022      Chemistry      Component Value Date/Time   NA 138 01/17/2022 0925   NA 145 (H) 09/08/2019 1359   NA 142 09/14/2013 1127   K 3.7 01/17/2022 0925   K 3.5 09/14/2013 1127   CL 102 01/17/2022 0925   CL 110 (H) 09/14/2013 1127   CO2 31 01/17/2022 0925   CO2 29 09/14/2013 1127   BUN 10 01/17/2022 0925   BUN 15 09/08/2019 1359   BUN 8 09/14/2013 1127   CREATININE 0.76 01/17/2022 0925   CREATININE 0.71 09/14/2013 1127   CREATININE 0.68 04/01/2013 1550      Component Value Date/Time   CALCIUM 9.0 01/17/2022 0925   CALCIUM 8.6 09/14/2013 1127   ALKPHOS 63 01/17/2022 0925   ALKPHOS 76 09/14/2013 1127   AST 22 01/17/2022 0925   AST 22 09/14/2013 1127   ALT 11 01/17/2022 0925   ALT 19 09/14/2013 1127   BILITOT 0.7 01/17/2022 0925   BILITOT 0.4 09/14/2013 1127       RADIOGRAPHIC STUDIES: I have personally reviewed the radiological images as listed and agreed with the findings in the report. No results found.   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   [see below]    # Currently on Calequence 100 mg twice a day [ AUG 1st, 2022].  We will repeat scans in 2 months.  Ordered next visit.  # Immunocompromised state: Hx Multifocal pneumonia-[April 2023]-Given the multiple pneumonias/high risk of death-recommended continuation of further rituximab. April 2023- IgG- 652.  Monitor for now.  Check quantitative immunoglobulins at  next visit.   # NICMP- [35-40%%- July 28th 2021]--  EVAL; NO CRT [Dr.End/Dr.Klein]July 2022- 2D echo 40 to 45% ejection fraction-   continue lasix 20 mg/day [sec to dizzy spells]; on coreg- defer to cardiology- STABLE.   #Chronic respiratory failure -2 L home O2 -NICMP/pulmonary hypertension/chronic bilateral lung scarring-followed by pulmonary [[place]]- STABLE.   # Left shoulder pain/ Chronic arthritis-on meloxicam as needed- STABLE.   #Social issues: Patient unable to go back to work because of multiple comorbidities-I think it is reasonable to consider disability.  Will refer to social worker  fridays pt pref-  # DISPOSITION:  # refer to social worker re: disability # follow up-MD in 4 weeks//friday [ MD ;labs- cbc/cmp/ldh/BNP; quantitative immunoglobulin]- ;.Dr.B          Orders Placed This Encounter  Procedures  . CBC with Differential/Platelet    Standing Status:   Future    Standing Expiration Date:   01/18/2023  . Comprehensive metabolic panel    Standing Status:   Future    Standing Expiration Date:   01/18/2023  . Lactate dehydrogenase    Standing Status:   Future    Standing Expiration Date:   01/18/2023  . Brain natriuretic peptide    Standing Status:   Future    Standing Expiration Date:   01/18/2023  .  Immunoglobulins, QN, A/E/G/M    Standing Status:   Future    Standing Expiration Date:   01/18/2023  . Ambulatory referral to Social Work    Referral Priority:   Routine    Referral Type:   Consultation    Referral Reason:   Specialty Services Required    Referred to Provider:   Adelene Amas, LCSW    Number of Visits Requested:   1     All questions were answered. The patient knows to call the clinic with any problems, questions or concerns.      Cammie Sickle, MD 01/18/2022 7:38 AM

## 2022-01-17 NOTE — Progress Notes (Unsigned)
Occasional nausea that is relieved with Zofran, rx pended for MD review.

## 2022-01-18 ENCOUNTER — Encounter: Payer: Self-pay | Admitting: Internal Medicine

## 2022-01-22 ENCOUNTER — Encounter: Payer: Self-pay | Admitting: Internal Medicine

## 2022-01-22 ENCOUNTER — Ambulatory Visit: Payer: Medicare HMO | Admitting: Internal Medicine

## 2022-01-22 VITALS — BP 94/60 | HR 68 | Ht 67.0 in | Wt 250.0 lb

## 2022-01-22 DIAGNOSIS — I5022 Chronic systolic (congestive) heart failure: Secondary | ICD-10-CM | POA: Diagnosis not present

## 2022-01-22 DIAGNOSIS — I2699 Other pulmonary embolism without acute cor pulmonale: Secondary | ICD-10-CM

## 2022-01-22 DIAGNOSIS — I447 Left bundle-branch block, unspecified: Secondary | ICD-10-CM

## 2022-01-22 NOTE — Patient Instructions (Signed)
Medication Instructions:   Your physician recommends that you continue on your current medications as directed. Please refer to the Current Medication list given to you today.  *If you need a refill on your cardiac medications before your next appointment, please call your pharmacy*   Lab Work:  None ordered  Testing/Procedures:  None ordered   Follow-Up: At [facility], you and your health needs are our priority.  As part of our continuing mission to provide you with exceptional heart care, we have created designated Provider Care Teams.  These Care Teams include your primary Cardiologist (physician) and Advanced Practice Providers (APPs -  Physician Assistants and Nurse Practitioners) who all work together to provide you with the care you need, when you need it.  We recommend signing up for the patient portal called "MyChart".  Sign up information is provided on this After Visit Summary.  MyChart is used to connect with patients for Virtual Visits (Telemedicine).  Patients are able to view lab/test results, encounter notes, upcoming appointments, etc.  Non-urgent messages can be sent to your provider as well.   To learn more about what you can do with MyChart, go to [email or web address].    Your next appointment:   3 month(s)  The format for your next appointment:   In Person  Provider:   You may see Nelva Bush, MD or one of the following Advanced Practice Providers on your designated Care Team:   Murray Hodgkins, NP Christell Faith, PA-C Cadence Kathlen Mody, [place]    Important Information About Sugar

## 2022-01-22 NOTE — Progress Notes (Signed)
Follow-up Outpatient Visit Date: 01/22/2022  Primary Care Provider: Burnard Hawthorne, FNP [address] [place] [address]  Chief Complaint: Follow-up HFrEF with recovered ejection fraction  HPI:  [name] is a 81 y.o. 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  I last saw her in February, at which time she was feeling fairly well.  She had stable exertional dyspnea walking across a parking lot.  She also noted transient orthostatic lightheadedness when standing up too quickly.  Due to difficulty splitting her spironolactone pills, she was taking 25 mg every other day rather than 12.5 mg daily.  We agreed to increase this to 25 mg daily for ease of dosing and to optimize her GDMT.  She was hospitalized in May with multifocal pneumonia and preceding diagnosis of COVID-19.  She had also been diagnosed with a PE in late March.  Due to soft blood pressure during the admission, carvedilol and furosemide were both held.  It appears that losartan and spironolactone were also held following her hospitalization in late March due to soft blood pressure.  Today, [name] reports that she is feeling fairly well, though she still feels more short of breath and tired with activity since her hospitalizations in the spring.  She is wearing supplemental oxygen (3 L via nasal cannula) when she is ambulating.  She has not had any chest pain, palpitations, or edema.  She also denies following though she is concerned about her knees giving out on her at times.  She also reports 1 episode of lightheadedness a week ago.  This was out of the ordinary.  Her appetite has not been that great recently though she still tries to stay well-hydrated.  She had  initially lost quite a bit of weight with her hospitalizations but gained it back.  She attributes that to eating more while on prednisone.  She previously declined pulmonary rehab and continues to be hesitant to move forward with this today.  --------------------------------------------------------------------------------------------------  Past Medical History:  Diagnosis Date   Arthritis    Collagen vascular disease ([place])    GERD (gastroesophageal reflux disease)    Headache(784.0)    HFrEF (heart failure with reduced ejection fraction) (HCC)    Nonischemic cardiomyopathy, LVEF as low as 30-35% in 08/2019   History of methotrexate therapy    Hyperlipidemia    hx   Lymphadenopathy of head and neck 01/2015   see on Thyroid ultrasound   Lymphoma, mantle cell ([place]) 06/01/2015   bx of lymph node in right breast/Stage IV Mantle Cell Lymphoma   Multifocal pneumonia 10/05/2021   Personal history of chemotherapy    Pulmonary embolism ([place]) 10/05/2021   acute   Respiratory failure ([place]) 10/05/2021   acute   Rheumatoid arthritis ([place])    Past Surgical History:  Procedure Laterality Date   BREAST BIOPSY Left 11/07/2020   u/s bx-hydromark #3 "coil"-path pending   CARDIAC CATHETERIZATION  09/2004   [facility]; EF 60%   CARDIAC CATHETERIZATION  08/2004   [facility]   IR FLUORO GUIDED NEEDLE PLC ASPIRATION/INJECTION LOC  06/18/2018   PERIPHERAL VASCULAR CATHETERIZATION N/A 07/04/2015   Procedure: [name] Insertion;  Surgeon: [name], MD;  Location: [facility];  Service: Cardiovascular;  Laterality: N/A;   PORTA CATH  REMOVAL N/A 06/23/2018   Procedure: PORTA CATH REMOVAL;  Surgeon: [name], MD;  Location: [facility];  Service: Cardiovascular;  Laterality: N/A;   RIGHT/LEFT HEART CATH AND CORONARY ANGIOGRAPHY Bilateral 09/20/2019   Procedure: RIGHT/LEFT HEART CATH AND CORONARY ANGIOGRAPHY;  Surgeon: Nelva Bush, MD;  Location: [facility];  Service: Cardiovascular;   Laterality: Bilateral;    Current Meds  Medication Sig   acalabrutinib maleate (CALQUENCE) 100 MG TABS Take 1 tablet (100 mg) by mouth in the morning and at bedtime.   acetaminophen (TYLENOL) 500 MG tablet Take 1,000 mg by mouth every 6 (six) hours as needed for mild pain.    albuterol (VENTOLIN HFA) 108 (90 Base) MCG/ACT inhaler 2 puffs inhaled orally every 4 to 6 hours as needed, not to exceed 12 puffs in 24 hours   apixaban (ELIQUIS) 5 MG TABS tablet Take 1 tablet (5 mg total) by mouth 2 (two) times daily.   carvedilol (COREG) 3.125 MG tablet Take 3.125 mg by mouth 2 (two) times daily.   fluticasone (FLONASE) 50 MCG/ACT nasal spray PLACE 2 SPRAYS INTO BOTH NOSTRILS DAILY AS NEEDED FOR ALLERGIES.   furosemide (LASIX) 40 MG tablet Take 40 mg by mouth daily.   ipratropium-albuterol (DUONEB) 0.5-2.5 (3) MG/3ML SOLN Take 3 mLs by nebulization every 6 (six) hours as needed.   MEGARED OMEGA-3 KRILL OIL PO Take 1 tablet by mouth daily.   Multiple Vitamins-Minerals (CENTRUM SILVER 50+WOMEN) TABS Take 1 tablet by mouth daily.   ondansetron (ZOFRAN) 8 MG tablet ONE PILL EVERY 8 HOURS AS NEEDED FOR NAUSEA/VOMITTING.   rosuvastatin (CRESTOR) 5 MG tablet TAKE 1 TABLET (5 MG TOTAL) BY MOUTH DAILY.    Allergies: Patient has no known allergies.  Social History   Tobacco Use   Smoking status: Never   Smokeless tobacco: Never  Vaping Use   Vaping Use: Never used  Substance Use Topics   Alcohol use: No   Drug use: No    Family History  Problem Relation Age of Onset   Diabetes Mother    Cholelithiasis Mother    Hypertension Sister    Diabetes Sister    Heart murmur Sister    Arthritis Brother    Breast cancer Neg Hx     Review of Systems: A 12-system review of systems was performed and was negative except as noted in the HPI.  --------------------------------------------------------------------------------------------------  Physical Exam: BP 94/60 (BP Location: Right Arm, Patient  Position: Sitting, Cuff Size: Large)   Pulse 68   Ht '5\' 7"'$  (1.702 m)   Wt 250 lb (113.4 kg)   SpO2 95% Comment: on 3L O2  BMI 39.16 kg/m   General:  NAD. Neck: No JVD or HJR. Lungs: Mildly diminished breath sounds throughout without wheezes or crackles. Heart: Regular rate and rhythm without murmurs, rubs, or gallops. Abdomen: Soft, nontender, nondistended. Extremities: No lower extremity edema.  EKG: Normal sinus rhythm with left bundle branch block and left axis deviation.  QRS axis has shifted to the left since 10/02/2021.  Otherwise, no significant interval change.  Lab Results  Component Value Date   WBC 6.3 01/17/2022   HGB 13.3 01/17/2022   HCT 42.7 01/17/2022   MCV 94.9 01/17/2022   PLT 199 01/17/2022    Lab Results  Component Value Date   NA 138 01/17/2022   K 3.7 01/17/2022   CL 102 01/17/2022   CO2 31 01/17/2022   BUN 10 01/17/2022   CREATININE 0.76 01/17/2022  GLUCOSE 107 (H) 01/17/2022   ALT 11 01/17/2022    Lab Results  Component Value Date   CHOL 151 10/12/2020   HDL 64.70 10/12/2020   LDLCALC 77 10/12/2020   TRIG 44.0 10/12/2020   CHOLHDL 2 10/12/2020    --------------------------------------------------------------------------------------------------  ASSESSMENT AND PLAN: Chronic HFrEF with recovered ejection fraction and left bundle branch block: Ms. Kitchen appears euvolemic on exam.  She has some exertional dyspnea and fatigue that are most likely driven primarily by her underlying lung issues following two hospitalizations for pneumonia as well as PE this spring.  Echocardiogram in April showed normalized LVEF, though RVSP suggests moderate pulmonary hypertension.  We will defer medication changes at this time.  She remains off goal-directed medical therapy other than low-dose carvedilol on account of soft blood pressure.  We are unable to reinitiate additional therapy today, though fortunately her echo in April showed normal LVEF.  She should  continue follow-up with pulmonary as well.  Pulmonary embolism: Continue apixaban 5 mg twice daily with long-term management per Dr. Rogue Bussing.  It may be worthwhile to repeat an echocardiogram after our follow-up visit if she does not notice improvement in her dyspnea to ensure that she is not developing worsening pulmonary hypertension.  Follow-up: Return to clinic in 3 months.  Nelva Bush, MD 01/22/2022 4:00 PM

## 2022-01-28 ENCOUNTER — Other Ambulatory Visit: Payer: Self-pay | Admitting: Family

## 2022-01-29 ENCOUNTER — Other Ambulatory Visit: Payer: Self-pay

## 2022-01-29 MED ORDER — CARVEDILOL 3.125 MG PO TABS
3.1250 mg | ORAL_TABLET | Freq: Two times a day (BID) | ORAL | 3 refills | Status: DC
  Filled 2022-01-29: qty 60, 30d supply, fill #0

## 2022-01-29 MED ORDER — FUROSEMIDE 40 MG PO TABS
40.0000 mg | ORAL_TABLET | Freq: Every day | ORAL | 3 refills | Status: DC
  Filled 2022-01-29: qty 30, 30d supply, fill #0

## 2022-02-03 ENCOUNTER — Other Ambulatory Visit: Payer: Self-pay | Admitting: Internal Medicine

## 2022-02-06 ENCOUNTER — Other Ambulatory Visit: Payer: Self-pay | Admitting: Internal Medicine

## 2022-02-06 ENCOUNTER — Other Ambulatory Visit (HOSPITAL_COMMUNITY): Payer: Self-pay

## 2022-02-06 DIAGNOSIS — C8311 Mantle cell lymphoma, lymph nodes of head, face, and neck: Secondary | ICD-10-CM

## 2022-02-10 ENCOUNTER — Other Ambulatory Visit: Payer: Self-pay | Admitting: Physician Assistant

## 2022-02-12 ENCOUNTER — Ambulatory Visit
Admission: RE | Admit: 2022-02-12 | Discharge: 2022-02-12 | Disposition: A | Discharge status: Home / Self Care | Payer: Medicare HMO | Source: Ambulatory Visit | Attending: Family | Admitting: Family

## 2022-02-12 DIAGNOSIS — Z1231 Encounter for screening mammogram for malignant neoplasm of breast: Secondary | ICD-10-CM | POA: Insufficient documentation

## 2022-02-14 ENCOUNTER — Other Ambulatory Visit: Payer: Self-pay | Admitting: Internal Medicine

## 2022-02-14 ENCOUNTER — Ambulatory Visit: Payer: Medicare HMO | Admitting: Internal Medicine

## 2022-02-14 ENCOUNTER — Other Ambulatory Visit: Payer: Medicare HMO

## 2022-02-14 NOTE — Telephone Encounter (Signed)
Please advise if ok to refill last filled by PCP.

## 2022-02-18 ENCOUNTER — Other Ambulatory Visit (HOSPITAL_COMMUNITY): Payer: Self-pay

## 2022-02-18 ENCOUNTER — Other Ambulatory Visit: Payer: Self-pay | Admitting: Internal Medicine

## 2022-02-18 DIAGNOSIS — C8311 Mantle cell lymphoma, lymph nodes of head, face, and neck: Secondary | ICD-10-CM

## 2022-02-18 MED ORDER — CALQUENCE 100 MG PO TABS
100.0000 mg | ORAL_TABLET | Freq: Two times a day (BID) | ORAL | 2 refills | Status: DC
  Filled 2022-02-18: qty 60, 30d supply, fill #0
  Filled 2022-03-17: qty 60, 30d supply, fill #1
  Filled 2022-04-10: qty 60, 30d supply, fill #2

## 2022-02-19 ENCOUNTER — Encounter: Payer: Self-pay | Admitting: Nurse Practitioner

## 2022-02-19 ENCOUNTER — Inpatient Hospital Stay (HOSPITAL_BASED_OUTPATIENT_CLINIC_OR_DEPARTMENT_OTHER): Payer: Medicare HMO | Admitting: Nurse Practitioner

## 2022-02-19 ENCOUNTER — Inpatient Hospital Stay: Payer: Medicare HMO | Attending: Internal Medicine

## 2022-02-19 ENCOUNTER — Ambulatory Visit: Payer: Medicare HMO | Admitting: Internal Medicine

## 2022-02-19 VITALS — BP 122/69 | HR 64 | Temp 97.8°F | Resp 20 | Wt 251.6 lb

## 2022-02-19 DIAGNOSIS — J9610 Chronic respiratory failure, unspecified whether with hypoxia or hypercapnia: Secondary | ICD-10-CM | POA: Insufficient documentation

## 2022-02-19 DIAGNOSIS — C8311 Mantle cell lymphoma, lymph nodes of head, face, and neck: Secondary | ICD-10-CM

## 2022-02-19 DIAGNOSIS — Z79899 Other long term (current) drug therapy: Secondary | ICD-10-CM | POA: Diagnosis not present

## 2022-02-19 DIAGNOSIS — M25512 Pain in left shoulder: Secondary | ICD-10-CM | POA: Insufficient documentation

## 2022-02-19 DIAGNOSIS — Z8616 Personal history of COVID-19: Secondary | ICD-10-CM | POA: Diagnosis not present

## 2022-02-19 DIAGNOSIS — I428 Other cardiomyopathies: Secondary | ICD-10-CM | POA: Diagnosis not present

## 2022-02-19 DIAGNOSIS — Z993 Dependence on wheelchair: Secondary | ICD-10-CM | POA: Diagnosis not present

## 2022-02-19 LAB — CBC WITH DIFFERENTIAL/PLATELET
Abs Immature Granulocytes: 0.04 10*3/uL (ref 0.00–0.07)
Basophils Absolute: 0 10*3/uL (ref 0.0–0.1)
Basophils Relative: 0 %
Eosinophils Absolute: 0.1 10*3/uL (ref 0.0–0.5)
Eosinophils Relative: 2 %
HCT: 41.2 % (ref 36.0–46.0)
Hemoglobin: 13.1 g/dL (ref 12.0–15.0)
Immature Granulocytes: 1 %
Lymphocytes Relative: 18 %
Lymphs Abs: 1.4 10*3/uL (ref 0.7–4.0)
MCH: 29.6 pg (ref 26.0–34.0)
MCHC: 31.8 g/dL (ref 30.0–36.0)
MCV: 93.2 fL (ref 80.0–100.0)
Monocytes Absolute: 0.4 10*3/uL (ref 0.1–1.0)
Monocytes Relative: 5 %
Neutro Abs: 6 10*3/uL (ref 1.7–7.7)
Neutrophils Relative %: 74 %
Platelets: 197 10*3/uL (ref 150–400)
RBC: 4.42 MIL/uL (ref 3.87–5.11)
RDW: 15 % (ref 11.5–15.5)
WBC: 8 10*3/uL (ref 4.0–10.5)
nRBC: 0 % (ref 0.0–0.2)

## 2022-02-19 LAB — COMPREHENSIVE METABOLIC PANEL
ALT: 11 U/L (ref 0–44)
AST: 24 U/L (ref 15–41)
Albumin: 3.6 g/dL (ref 3.5–5.0)
Alkaline Phosphatase: 71 U/L (ref 38–126)
Anion gap: 7 (ref 5–15)
BUN: 12 mg/dL (ref 8–23)
CO2: 31 mmol/L (ref 22–32)
Calcium: 9.1 mg/dL (ref 8.9–10.3)
Chloride: 102 mmol/L (ref 98–111)
Creatinine, Ser: 0.92 mg/dL (ref 0.44–1.00)
GFR, Estimated: >60 mL/min (ref >60)
Glucose, Bld: 110 mg/dL — ABNORMAL HIGH (ref 70–99)
Potassium: 3.6 mmol/L (ref 3.5–5.1)
Sodium: 140 mmol/L (ref 135–145)
Total Bilirubin: 0.7 mg/dL (ref 0.3–1.2)
Total Protein: 6.7 g/dL (ref 6.5–8.1)

## 2022-02-19 LAB — LACTATE DEHYDROGENASE: LDH: 171 U/L (ref 98–192)

## 2022-02-19 LAB — BRAIN NATRIURETIC PEPTIDE: B Natriuretic Peptide: 39.1 pg/mL (ref 0.0–100.0)

## 2022-02-19 NOTE — Progress Notes (Signed)
[facility] PROGRESS NOTE  Patient Care Team: Burnard Hawthorne, FNP as PCP - General (Family Medicine) End, Harrell Gave, MD as PCP - Cardiology (Cardiology) Deboraha Sprang, MD as PCP - Electrophysiology (Cardiology) Jackolyn Confer, MD (Internal Medicine) Cammie Sickle, MD as Consulting Physician (Internal Medicine)   Cancer Staging  No matching staging information was found for the patient.  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 Feb 9th- START Benda-Ritux with neulasta; Prolonged neutropenia; DISCONT- Benda-Ritux;   # April 13 th 2017- START R-CHOP x1; severe/prolonged neutropenia; PET- CR; BMBx-Neg; Disc R-CHOP # June 2022- DIAGNOSIS: thickened cortex of 12 mm. An additional lymph node demonstrates a thickened cortex of 7 mm. A third lymph node demonstrates a mildly thickened cortex of 4 mm A. LYMPH NODE, LEFT AXILLA; ULTRASOUND-GUIDED BIOPSY:  - CD5+ MONOCLONAL B-CELL POPULATION; COMPATIBLE WITH INVOLVEMENT BY THE  PATIENT'S KNOWN MANTLE CELL LYMPHOMA.   #July 2022-second week-started ibrutinib 420 mg a dayx 1 week-stop because of severe rash.  Significant clinical response noted.  # # 26th MAY 2017- Start Rituxan q 64M Main OCT 12th 2017- PET NED.    # AUG 1st, 2022- start acalbrutinib. 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    Michelle [name] 81 y.o.  female pleasant patient above history of recurrent mantle cell lymphoma, currently on Calquence is here for follow-up. She has been self weaning off oxygen. Continues to need it intermittently with exercise but denies SOB at rest. No interval hospitalizations. No nausea or vomiting. Denies diarrhea. No chest pain, lumps or bumps.    Review of Systems  Constitutional:  Negative for chills, fever, malaise/fatigue and weight loss.  HENT:  Negative for hearing loss, nosebleeds, sore throat and tinnitus.   Eyes:  Negative for blurred vision and double vision.  Respiratory:  Negative for cough, hemoptysis, shortness of breath and wheezing.   Cardiovascular:  Negative for chest pain, palpitations and leg swelling.  Gastrointestinal:  Negative for abdominal pain, blood in stool, constipation, diarrhea, melena, nausea and vomiting.  Genitourinary:  Negative for dysuria and urgency.  Musculoskeletal:  Positive for back pain and joint pain. Negative for falls and myalgias.  Skin:  Negative for itching and rash.  Neurological:  Negative for dizziness, tingling, sensory change, loss of consciousness, weakness and headaches.  Endo/Heme/Allergies:  Negative for environmental allergies. Does not bruise/bleed easily.  Psychiatric/Behavioral:  Negative for depression. The patient is not nervous/anxious and does not have insomnia.     PAST MEDICAL HISTORY :  Past Medical History:  Diagnosis Date   Arthritis    Collagen vascular disease ([place])    GERD (gastroesophageal reflux disease)    Headache(784.0)    HFrEF (heart failure with reduced ejection fraction) (HCC)    Nonischemic cardiomyopathy, LVEF as low as 30-35% in 08/2019   History of methotrexate therapy    Hyperlipidemia  hx   Lymphadenopathy of head and neck 01/2015   see on Thyroid ultrasound   Lymphoma, mantle cell ([place]) 06/01/2015   bx of lymph node in right breast/Stage IV Mantle Cell Lymphoma   Multifocal  pneumonia 10/05/2021   Personal history of chemotherapy    Pulmonary embolism ([place]) 10/05/2021   acute   Respiratory failure ([place]) 10/05/2021   acute   Rheumatoid arthritis ([place])     PAST SURGICAL HISTORY :   Past Surgical History:  Procedure Laterality Date   BREAST BIOPSY Left 11/07/2020   u/s bx-hydromark #3 "coil"-path pending   CARDIAC CATHETERIZATION  09/2004   [facility]; EF 60%   CARDIAC CATHETERIZATION  08/2004   [facility]   IR FLUORO GUIDED NEEDLE PLC ASPIRATION/INJECTION LOC  06/18/2018   PERIPHERAL VASCULAR CATHETERIZATION N/A 07/04/2015   Procedure: [name] Insertion;  Surgeon: [name], MD;  Location: [facility];  Service: Cardiovascular;  Laterality: N/A;   PORTA CATH REMOVAL N/A 06/23/2018   Procedure: PORTA CATH REMOVAL;  Surgeon: [name], MD;  Location: [facility];  Service: Cardiovascular;  Laterality: N/A;   RIGHT/LEFT HEART CATH AND CORONARY ANGIOGRAPHY Bilateral 09/20/2019   Procedure: RIGHT/LEFT HEART CATH AND CORONARY ANGIOGRAPHY;  Surgeon: Nelva Bush, MD;  Location: [facility];  Service: Cardiovascular;  Laterality: Bilateral;    FAMILY HISTORY :   Family History  Problem Relation Age of Onset   Diabetes Mother    Cholelithiasis Mother    Hypertension Sister    Diabetes Sister    Heart murmur Sister    Arthritis Brother    Breast cancer Neg Hx     SOCIAL HISTORY:   Social History   Tobacco Use   Smoking status: Never   Smokeless tobacco: Never  Vaping Use   Vaping Use: Never used  Substance Use Topics   Alcohol use: No   Drug use: No    ALLERGIES:  has No Known Allergies.  MEDICATIONS:  Current Outpatient Medications  Medication Sig Dispense Refill   acalabrutinib maleate (CALQUENCE) 100 MG tablet Take 1 tablet (100 mg total) by mouth 2 (two) times daily. 60 tablet 2   acetaminophen (TYLENOL) 500 MG tablet Take 1,000 mg by mouth every 6 (six) hours as needed for mild pain.      albuterol (VENTOLIN HFA)  108 (90 Base) MCG/ACT inhaler 2 puffs inhaled orally every 4 to 6 hours as needed, not to exceed 12 puffs in 24 hours 18 each 1   apixaban (ELIQUIS) 5 MG TABS tablet Take 1 tablet (5 mg total) by mouth 2 (two) times daily. 60 tablet 2   carvedilol (COREG) 3.125 MG tablet Take 1 tablet (3.125 mg total) by mouth 2 (two) times daily. 60 tablet 3   fluticasone (FLONASE) 50 MCG/ACT nasal spray PLACE 2 SPRAYS INTO BOTH NOSTRILS DAILY AS NEEDED FOR ALLERGIES. 48 mL 1   furosemide (LASIX) 40 MG tablet Take 1 tablet (40 mg total) by mouth daily. 30 tablet 3   ipratropium-albuterol (DUONEB) 0.5-2.5 (3) MG/3ML SOLN Take 3 mLs by nebulization every 6 (six) hours as needed. 1080 mL 0   MEGARED OMEGA-3 KRILL OIL PO Take 1 tablet by mouth daily.     Multiple Vitamins-Minerals (CENTRUM SILVER 50+WOMEN) TABS Take 1 tablet by mouth daily.     ondansetron (ZOFRAN) 8 MG tablet ONE PILL EVERY 8 HOURS AS NEEDED FOR NAUSEA/VOMITTING. 40 tablet 1   rosuvastatin (CRESTOR) 5 MG tablet TAKE 1 TABLET (5 MG  TOTAL) BY MOUTH DAILY. 90 tablet 3   feeding supplement (ENSURE ENLIVE / ENSURE PLUS) LIQD Take 237 mLs by mouth 3 (three) times daily between meals. (Patient not taking: Reported on 11/22/2021) 237 mL 12   No current facility-administered medications for this visit.   Facility-Administered Medications Ordered in Other Visits  Medication Dose Route Frequency Provider Last Rate Last Admin   heparin lock flush 100 unit/mL  500 Units Intravenous Once Charlaine Dalton R, MD       sodium chloride flush (NS) 0.9 % injection 10 mL  10 mL Intravenous Once Charlaine Dalton R, MD       sodium chloride flush (NS) 0.9 % injection 10 mL  10 mL Intravenous Once Cammie Sickle, MD        PHYSICAL EXAMINATION: ECOG PERFORMANCE STATUS: 1 - Symptomatic but completely ambulatory  BP 122/69   Pulse 64   Temp 97.8 F (36.6 C)   Resp 20   Wt 251 lb 9.6 oz (114.1 kg)   SpO2 100%   BMI 39.41 kg/m   Filed Weights    02/19/22 1031  Weight: 251 lb 9.6 oz (114.1 kg)    Physical Exam Constitutional:      Appearance: She is not ill-appearing.  Eyes:     General: No scleral icterus.    Conjunctiva/sclera: Conjunctivae normal.  Cardiovascular:     Rate and Rhythm: Normal rate and regular rhythm.  Pulmonary:     Comments: Diminished bilaterally Abdominal:     General: There is no distension.     Palpations: Abdomen is soft.     Tenderness: There is no abdominal tenderness. There is no guarding.  Musculoskeletal:        General: No deformity.     Right lower leg: No edema.     Left lower leg: No edema.  Lymphadenopathy:     Cervical: No cervical adenopathy.  Skin:    General: Skin is warm and dry.     Findings: No bruising.  Neurological:     Mental Status: She is alert and oriented to person, place, and time. Mental status is at baseline.  Psychiatric:        Mood and Affect: Mood normal.        Behavior: Behavior normal.    LABORATORY DATA:  I have reviewed the data as listed    Component Value Date/Time   NA 138 01/17/2022 0925   NA 145 (H) 09/08/2019 1359   NA 142 09/14/2013 1127   K 3.7 01/17/2022 0925   K 3.5 09/14/2013 1127   CL 102 01/17/2022 0925   CL 110 (H) 09/14/2013 1127   CO2 31 01/17/2022 0925   CO2 29 09/14/2013 1127   GLUCOSE 107 (H) 01/17/2022 0925   GLUCOSE 106 (H) 09/14/2013 1127   BUN 10 01/17/2022 0925   BUN 15 09/08/2019 1359   BUN 8 09/14/2013 1127   CREATININE 0.76 01/17/2022 0925   CREATININE 0.71 09/14/2013 1127   CREATININE 0.68 04/01/2013 1550   CALCIUM 9.0 01/17/2022 0925   CALCIUM 8.6 09/14/2013 1127   PROT 6.5 01/17/2022 0925   PROT 7.6 09/14/2013 1127   ALBUMIN 3.7 01/17/2022 0925   ALBUMIN 3.2 (L) 09/14/2013 1127   AST 22 01/17/2022 0925   AST 22 09/14/2013 1127   ALT 11 01/17/2022 0925   ALT 19 09/14/2013 1127   ALKPHOS 63 01/17/2022 0925   ALKPHOS 76 09/14/2013 1127   BILITOT 0.7 01/17/2022 0925   BILITOT 0.4 09/14/2013  1127    GFRNONAA >60 01/17/2022 0925   GFRNONAA >60 09/14/2013 1127   GFRAA >60 01/20/2020 1303   GFRAA >60 09/14/2013 1127   Lab Results  Component Value Date   WBC 8.0 02/19/2022   NEUTROABS 6.0 02/19/2022   HGB 13.1 02/19/2022   HCT 41.2 02/19/2022   MCV 93.2 02/19/2022   PLT 197 02/19/2022     RADIOGRAPHIC STUDIES: I have personally reviewed the radiological images as listed and agreed with the findings in the report. No results found.   ASSESSMENT & PLAN:  No problem-specific Assessment & Plan notes found for this encounter.  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 Rituximab was discontinued d/t multifocal pneumonia secondary to covid 19. PET 2/23 revealed complete response. Clinically no evidence of recurrence today. Labs reviewed and acceptable for continuation with treatment. Confirmed with Alyson that refill will ship in 2 days. Plan to continue calequence 100 mg BID (Dec 31, 2020) and get pet for surveillance prior to next visit.   # Immunocompromise- hx of multifocal pneuonia (April 2023) and covid. Given multiple pneumonias and high risk of death, we discussed risk of continuing calquence vs rituximab. She is tolerating calquence well. April 2023 IgG was 652. Quantitative immunoglobulins pending at time of visit and we discussed this may impact treatment changes. For now, continue calquence. Continue infection prevention precautions. Recommended strict mask wearing.    # NICMP- [35-40%%- July 28th 2021]--  EVAL; NO CRT [Dr.End/Dr.Klein]July 2022- 2D echo 40 to 45% ejection fraction-   continue lasix 20 mg/day [sec to dizzy spells]; on coreg- defer to cardiology- BNP today has improved to 39.1. Weight is remaining stable and no evidence of fluid overload.    # Chronic respiratory failure -3 L home O2 -NICMP/pulmonary hypertension/chronic bilateral lung scarring-followed by  pulmonary, Dr. Verlee Monte [[place]]- She is self weaning oxygen and using as needed. Oxygen levels are improved. Encouraged her to monitor levels at home using a home finger monitor with goal of maintaining SpO2 > 93%.    # Left shoulder pain/ Chronic arthritis-on meloxicam as needed- STABLE.    # Social issues: s/p referral to SW for discussion of disability. Given her immunocompromise and comorbidities disability is recommended. Will reach out to Teresita and ask her to contact patient again.    fridays pt pref-   # DISPOSITION:  3 weeks- PET 4 weeks- lab (cbc, cmp, ldh), Dr. Rogue Bussing (pt prefers Fridays)  No orders of the defined types were placed in this encounter.  All questions were answered. The patient knows to call the clinic with any problems, questions or concerns.     Verlon Au, NP 02/19/2022

## 2022-02-19 NOTE — Progress Notes (Signed)
Patient states left ear is stopped when sitting up.

## 2022-02-20 ENCOUNTER — Inpatient Hospital Stay: Payer: Medicare HMO | Admitting: Licensed Clinical Social Worker

## 2022-02-20 ENCOUNTER — Other Ambulatory Visit (HOSPITAL_COMMUNITY): Payer: Self-pay

## 2022-02-20 DIAGNOSIS — C8311 Mantle cell lymphoma, lymph nodes of head, face, and neck: Secondary | ICD-10-CM

## 2022-02-20 NOTE — Progress Notes (Signed)
[facility]  Initial Assessment   Michelle [name] is a 81 y.o. year old female contacted by phone. Clinical Social Work was referred by medical provider for assessment of psychosocial needs.   SDOH (Social Determinants of Health) assessments performed: Yes SDOH Interventions    Flowsheet Row Clinical Support from 02/20/2022 in [place] at [facility] Interventions   Food Insecurity Interventions Intervention Not Indicated  Housing Interventions Intervention Not Indicated  Transportation Interventions Patient Resources (Friends/Family), Intervention Not Indicated  Utilities Interventions Intervention Not Indicated  Alcohol Usage Interventions Intervention Not Indicated (Score <7)  Physical Activity Interventions Intervention Not Indicated  Stress Interventions Intervention Not Indicated  Social Connections Interventions Intervention Not Indicated       SDOH Screenings   Food Insecurity: No Food Insecurity (02/20/2022)  Housing: Low Risk  (02/20/2022)  Transportation Needs: No Transportation Needs (02/20/2022)  Utilities: Not At Risk (02/20/2022)  Alcohol Screen: Low Risk  (02/20/2022)  Depression (PHQ2-9): Low Risk  (02/20/2022)  Financial Resource Strain: Low Risk  (02/20/2022)  Physical Activity: Insufficiently Active (02/20/2022)  Social Connections: Socially Integrated (02/20/2022)  Stress: No Stress Concern Present (02/20/2022)  Tobacco Use: Low Risk  (02/19/2022)     Distress Screen completed: No    02/21/2016    9:32 AM  ONCBCN DISTRESS SCREENING  Screening Type Initial Screening  Distress experienced in past week (1-10) 0      Family/Social Information:  Housing Arrangement: patient lives with sister,  Delle Reining [phone], other caregiver (sister)  Alverda Skeans [phone].  Patient is married but is living with her sister. Family members/support persons in your life? Family, Friends, Social worker, Education administrator, and Management consultant concerns: no  Employment: Retired  .  Income source: Paediatric nurse concerns:  No current immediate concerns, but patient requested referral for Designer, jewellery and assistance with gas card and [employer] card. Type of concern:  no immediate concerns, but patient would like referral to [employer]er, jewellery Food access concerns: no Religious or spiritual practice: Yes-  Services Currently in place:  [employer]  Coping/ Adjustment to diagnosis: Patient understands treatment plan and what happens next? yes Concerns about diagnosis and/or treatment: Overwhelmed by information, How will I care for myself, and Quality of life Patient reported stressors: Adjusting to my illness and Physical issues Hopes and/or priorities: N/A Patient enjoys time with family/ friends Current coping skills/ strengths: Ability for insight , Average or above average intelligence , Communication skills , Scientist, research (life sciences) , Motivation for treatment/growth , Religious Affiliation , and Supportive family/friends     SUMMARY: Current SDOH Barriers:  No SDOH barriers identified  Clinical Social Work Clinical Goal(s):  No clinical social work goals at this time  Interventions: Discussed common feeling and emotions when being diagnosed with cancer, and the importance of support during treatment Informed patient of the support team roles and support services at [facility] Provided [place] contact information and encouraged patient to call with any questions or concerns Referred patient to Designer, jewellery, assistance with gas card, [employer] and Provided patient with information about , CSW role in patient care and other available resources.   Follow Up Plan: Patient will contact CSW with any support or resource needs Patient verbalizes understanding of plan: Yes    Nehan Flaum, LCSW

## 2022-02-21 ENCOUNTER — Other Ambulatory Visit: Payer: Self-pay | Admitting: Physician Assistant

## 2022-02-21 LAB — IMMUNOGLOBULINS A/E/G/M, SERUM
IgA: 89 mg/dL (ref 64–422)
IgE (Immunoglobulin E), Serum: <2 IU/mL — ABNORMAL LOW (ref 6–495)
IgG (Immunoglobin G), Serum: 762 mg/dL (ref 586–1602)
IgM (Immunoglobulin M), Srm: 9 mg/dL — ABNORMAL LOW (ref 26–217)

## 2022-03-07 ENCOUNTER — Other Ambulatory Visit: Payer: Self-pay | Admitting: Physician Assistant

## 2022-03-12 ENCOUNTER — Ambulatory Visit
Admission: RE | Admit: 2022-03-12 | Discharge: 2022-03-12 | Disposition: A | Discharge status: Home / Self Care | Payer: Medicare HMO | Source: Ambulatory Visit | Attending: Nurse Practitioner | Admitting: Nurse Practitioner

## 2022-03-12 DIAGNOSIS — C8311 Mantle cell lymphoma, lymph nodes of head, face, and neck: Secondary | ICD-10-CM | POA: Diagnosis not present

## 2022-03-12 DIAGNOSIS — C8310 Mantle cell lymphoma, unspecified site: Secondary | ICD-10-CM | POA: Diagnosis not present

## 2022-03-12 LAB — GLUCOSE, CAPILLARY: Glucose-Capillary: 105 mg/dL — ABNORMAL HIGH (ref 70–99)

## 2022-03-12 MED ORDER — FLUDEOXYGLUCOSE F - 18 (FDG) INJECTION
13.0000 | Freq: Once | INTRAVENOUS | Status: AC | PRN
  Administered 2022-03-12: 13.87 via INTRAVENOUS

## 2022-03-13 ENCOUNTER — Other Ambulatory Visit (HOSPITAL_COMMUNITY): Payer: Self-pay

## 2022-03-17 ENCOUNTER — Other Ambulatory Visit (HOSPITAL_COMMUNITY): Payer: Self-pay

## 2022-03-19 ENCOUNTER — Other Ambulatory Visit (HOSPITAL_COMMUNITY): Payer: Self-pay

## 2022-03-20 ENCOUNTER — Other Ambulatory Visit: Payer: Self-pay | Admitting: *Deleted

## 2022-03-20 ENCOUNTER — Other Ambulatory Visit: Payer: Self-pay | Admitting: Internal Medicine

## 2022-03-20 DIAGNOSIS — C8311 Mantle cell lymphoma, lymph nodes of head, face, and neck: Secondary | ICD-10-CM

## 2022-03-21 ENCOUNTER — Inpatient Hospital Stay: Payer: Medicare HMO

## 2022-03-21 ENCOUNTER — Other Ambulatory Visit: Payer: Self-pay | Admitting: Physician Assistant

## 2022-03-21 ENCOUNTER — Encounter: Payer: Self-pay | Admitting: Internal Medicine

## 2022-03-21 ENCOUNTER — Inpatient Hospital Stay (HOSPITAL_BASED_OUTPATIENT_CLINIC_OR_DEPARTMENT_OTHER): Payer: Medicare HMO | Admitting: Internal Medicine

## 2022-03-21 ENCOUNTER — Inpatient Hospital Stay: Payer: Medicare HMO | Attending: Internal Medicine

## 2022-03-21 DIAGNOSIS — Z23 Encounter for immunization: Secondary | ICD-10-CM

## 2022-03-21 DIAGNOSIS — C8311 Mantle cell lymphoma, lymph nodes of head, face, and neck: Secondary | ICD-10-CM

## 2022-03-21 DIAGNOSIS — Z79899 Other long term (current) drug therapy: Secondary | ICD-10-CM | POA: Diagnosis not present

## 2022-03-21 LAB — CBC WITH DIFFERENTIAL/PLATELET
Abs Immature Granulocytes: 0.04 10*3/uL (ref 0.00–0.07)
Basophils Absolute: 0 10*3/uL (ref 0.0–0.1)
Basophils Relative: 0 %
Eosinophils Absolute: 0.1 10*3/uL (ref 0.0–0.5)
Eosinophils Relative: 1 %
HCT: 42.7 % (ref 36.0–46.0)
Hemoglobin: 13.6 g/dL (ref 12.0–15.0)
Immature Granulocytes: 0 %
Lymphocytes Relative: 15 %
Lymphs Abs: 1.4 10*3/uL (ref 0.7–4.0)
MCH: 29.6 pg (ref 26.0–34.0)
MCHC: 31.9 g/dL (ref 30.0–36.0)
MCV: 92.8 fL (ref 80.0–100.0)
Monocytes Absolute: 0.3 10*3/uL (ref 0.1–1.0)
Monocytes Relative: 3 %
Neutro Abs: 7.3 10*3/uL (ref 1.7–7.7)
Neutrophils Relative %: 81 %
Platelets: 229 10*3/uL (ref 150–400)
RBC: 4.6 MIL/uL (ref 3.87–5.11)
RDW: 14.6 % (ref 11.5–15.5)
WBC: 9.1 10*3/uL (ref 4.0–10.5)
nRBC: 0 % (ref 0.0–0.2)

## 2022-03-21 LAB — COMPREHENSIVE METABOLIC PANEL
ALT: 12 U/L (ref 0–44)
AST: 22 U/L (ref 15–41)
Albumin: 3.6 g/dL (ref 3.5–5.0)
Alkaline Phosphatase: 64 U/L (ref 38–126)
Anion gap: 9 (ref 5–15)
BUN: 15 mg/dL (ref 8–23)
CO2: 30 mmol/L (ref 22–32)
Calcium: 9 mg/dL (ref 8.9–10.3)
Chloride: 101 mmol/L (ref 98–111)
Creatinine, Ser: 0.79 mg/dL (ref 0.44–1.00)
GFR, Estimated: >60 mL/min (ref >60)
Glucose, Bld: 107 mg/dL — ABNORMAL HIGH (ref 70–99)
Potassium: 3.7 mmol/L (ref 3.5–5.1)
Sodium: 140 mmol/L (ref 135–145)
Total Bilirubin: 0.8 mg/dL (ref 0.3–1.2)
Total Protein: 6.9 g/dL (ref 6.5–8.1)

## 2022-03-21 LAB — LACTATE DEHYDROGENASE: LDH: 147 U/L (ref 98–192)

## 2022-03-21 MED ORDER — INFLUENZA VAC A&B SA ADJ QUAD 0.5 ML IM PRSY
0.5000 mL | PREFILLED_SYRINGE | Freq: Once | INTRAMUSCULAR | Status: AC
  Administered 2022-03-21: 0.5 mL via INTRAMUSCULAR
  Filled 2022-03-21: qty 0.5

## 2022-03-21 MED ORDER — BENZONATATE 100 MG PO CAPS: 100.0000 mg | ORAL_CAPSULE | Freq: Two times a day (BID) | ORAL | 0 refills | Status: DC | PRN

## 2022-03-21 NOTE — Assessment & Plan Note (Addendum)
#  Mantle cell lymphoma recurrent biopsy-proven [July 2022]; Currently on Calquence [ DISCONTINUED  Rituximab-given the multifocal pneumonia]-  PET scan OCT 13th, 2023- complete response;  No evidence non-Hodgkin's lymphoma/mantle cell recurrence or progression.   # Currently on Calequence 100 mg twice a day [ AUG 1st, 2022].  Tolerating well.  # Immunocompromised state: Hx Multifocal pneumonia-[April 2023]-Given the multiple pneumonias/high risk of death-recommended continuation of further rituximab. April 2023- IgG- 652.  Monitor for now.  Check quantitative immunoglobulins at next visit.   # NICMP- [35-40%%- July 28th 2021]--  EVAL; NO CRT [Dr.End/Dr.Klein]July 2022- 2D echo 40 to 45% ejection fraction-   continue lasix 20 mg/day [sec to dizzy spells]; on coreg- defer to cardiology- STABLE.   #Chronic respiratory failure -2 L home O2 -NICMP/pulmonary hypertension/chronic bilateral lung scarring-followed by pulmonary [[place]]-  STABLE.   #Mild cough- ?  Question URI.  Recommend Tessalon Perles.  # Left shoulder pain/ Chronic arthritis-on meloxicam as needed- STABLE.    #Vaccinations : Proceed with flu shot- today.   fridays pt pref-  # DISPOSITION:  # flu shot today  # follow up-MD in 4 weeks//friday [ MD ;labs- cbc/cmp/ldh/BNP; quantitative immunoglobulin]- .Dr.B  # I reviewed the blood work- with the patient in detail; also reviewed the imaging independently [as summarized above]; and with the patient in detail.

## 2022-03-21 NOTE — Progress Notes (Signed)
Has a cough when lying on left side since she was diagnosed with COVD and pneumonia.   Urine flow seems slow with no other symptoms.    Started back on the Herbal Lite vitamin that does help improve her energy level.

## 2022-03-21 NOTE — Progress Notes (Signed)
[place] OFFICE PROGRESS NOTE  Patient Care Team: Burnard Hawthorne, FNP as PCP - General (Family Medicine) End, Harrell Gave, MD as PCP - Cardiology (Cardiology) Deboraha Sprang, MD as PCP - Electrophysiology (Cardiology) Jackolyn Confer, MD (Internal Medicine) Cammie Sickle, MD as Consulting Physician (Internal Medicine)   Cancer Staging  No matching staging information was found for the patient.   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 Feb 9th- START Benda-Ritux with neulasta; Prolonged neutropenia; DISCONT- Benda-Ritux;   # April 13 th 2017- START R-CHOP x1; severe/prolonged neutropenia; PET- CR; BMBx-Neg; Disc R-CHOP # June 2022- DIAGNOSIS: thickened cortex of 12 mm. An additional lymph node demonstrates a thickened cortex of 7 mm. A third lymph node demonstrates a mildly thickened cortex of 4 mm A. LYMPH NODE, LEFT AXILLA; ULTRASOUND-GUIDED BIOPSY:  - CD5+ MONOCLONAL B-CELL POPULATION; COMPATIBLE WITH INVOLVEMENT BY THE  PATIENT'S KNOWN MANTLE CELL LYMPHOMA.   #July 2022-second week-started ibrutinib 420 mg a dayx 1 week-stop because of severe rash.  Significant clinical response noted.  # # 26th MAY 2017- Start Rituxan q 62M Main OCT 12th 2017- PET NED.    # AUG 1st, 2022- start acalbrutinib. 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    Michelle [name] 81 y.o.  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  Has a cough when lying on left side. since she was diagnosed with COVD and pneumonia.  Chronic mild cough not getting worse.  Improved with Robitussin.  Patient admits compliance to Calquence.  Denies any headaches.Patient continues to be on 2 L of oxygen but as needed.  No further hospitalizations.  No nausea no vomiting.  No diarrhea.  No chest pain no new lumps or bumps.  Review of Systems  Constitutional:  Negative for chills, diaphoresis, fever, malaise/fatigue and weight loss.  HENT:  Negative for nosebleeds and sore throat.   Eyes:  Negative for double vision.  Respiratory:  Negative for hemoptysis, sputum production and wheezing.   Cardiovascular:  Negative for chest pain, palpitations, orthopnea and leg swelling.  Gastrointestinal:  Positive for constipation. Negative for abdominal pain, blood in stool, diarrhea, heartburn, melena, nausea and vomiting.  Genitourinary:  Negative for dysuria, frequency and urgency.  Musculoskeletal:  Positive for back pain and joint pain.  Skin: Negative.  Negative for itching and rash.  Neurological:  Negative for dizziness, tingling, focal weakness, weakness and headaches.  Endo/Heme/Allergies:  Does not bruise/bleed easily.  Psychiatric/Behavioral:  Negative for depression. The patient is not nervous/anxious and does not have insomnia.     PAST MEDICAL HISTORY :  Past Medical History:  Diagnosis Date   Arthritis    Collagen vascular disease ([place])    GERD (gastroesophageal reflux disease)    Headache(784.0)    HFrEF (heart failure with  reduced ejection fraction) (HCC)    Nonischemic cardiomyopathy, LVEF as low as 30-35% in 08/2019   History of methotrexate therapy    Hyperlipidemia    hx   Lymphadenopathy of head and neck 01/2015   see on Thyroid ultrasound   Lymphoma,  mantle cell ([place]) 06/01/2015   bx of lymph node in right breast/Stage IV Mantle Cell Lymphoma   Multifocal pneumonia 10/05/2021   Personal history of chemotherapy    Pulmonary embolism ([place]) 10/05/2021   acute   Respiratory failure ([place]) 10/05/2021   acute   Rheumatoid arthritis ([place])     PAST SURGICAL HISTORY :   Past Surgical History:  Procedure Laterality Date   BREAST BIOPSY Left 11/07/2020   u/s bx-hydromark #3 "coil"-path pending   CARDIAC CATHETERIZATION  09/2004   [facility]; EF 60%   CARDIAC CATHETERIZATION  08/2004   [facility]   IR FLUORO GUIDED NEEDLE PLC ASPIRATION/INJECTION LOC  06/18/2018   PERIPHERAL VASCULAR CATHETERIZATION N/A 07/04/2015   Procedure: [name] Insertion;  Surgeon: [name], MD;  Location: [facility];  Service: Cardiovascular;  Laterality: N/A;   PORTA CATH REMOVAL N/A 06/23/2018   Procedure: PORTA CATH REMOVAL;  Surgeon: [name], MD;  Location: [facility];  Service: Cardiovascular;  Laterality: N/A;   RIGHT/LEFT HEART CATH AND CORONARY ANGIOGRAPHY Bilateral 09/20/2019   Procedure: RIGHT/LEFT HEART CATH AND CORONARY ANGIOGRAPHY;  Surgeon: Nelva Bush, MD;  Location: [facility];  Service: Cardiovascular;  Laterality: Bilateral;    FAMILY HISTORY :   Family History  Problem Relation Age of Onset   Diabetes Mother    Cholelithiasis Mother    Hypertension Sister    Diabetes Sister    Heart murmur Sister    Arthritis Brother    Breast cancer Neg Hx     SOCIAL HISTORY:   Social History   Tobacco Use   Smoking status: Never   Smokeless tobacco: Never  Vaping Use   Vaping Use: Never used  Substance Use Topics   Alcohol use: No   Drug use: No    ALLERGIES:  has No Known Allergies.  MEDICATIONS:  Current Outpatient Medications  Medication Sig Dispense Refill   acalabrutinib maleate (CALQUENCE) 100 MG tablet Take 1 tablet (100 mg total) by mouth 2 (two) times daily. 60 tablet 2   acetaminophen (TYLENOL) 500 MG  tablet Take 1,000 mg by mouth every 6 (six) hours as needed for mild pain.      albuterol (VENTOLIN HFA) 108 (90 Base) MCG/ACT inhaler 2 puffs inhaled orally every 4 to 6 hours as needed, not to exceed 12 puffs in 24 hours 18 each 1   apixaban (ELIQUIS) 5 MG TABS tablet Take 1 tablet (5 mg total) by mouth 2 (two) times daily. 60 tablet 2   benzonatate (TESSALON) 100 MG capsule Take 1 capsule (100 mg total) by mouth 2 (two) times daily as needed for cough. 40 capsule 0   carvedilol (COREG) 3.125 MG tablet TAKE 1 TABLET BY MOUTH TWICE A DAY 180 tablet 0   fluticasone (FLONASE) 50 MCG/ACT nasal spray PLACE 2 SPRAYS INTO BOTH NOSTRILS DAILY AS NEEDED FOR ALLERGIES. 48 mL 1   furosemide (LASIX) 40 MG tablet TAKE 1 TABLET BY MOUTH EVERY DAY 90 tablet 0   ipratropium-albuterol (DUONEB) 0.5-2.5 (3) MG/3ML SOLN Take 3 mLs by nebulization every 6 (six) hours as needed. 1080 mL 0   MEGARED OMEGA-3 KRILL OIL PO Take 1 tablet by mouth daily.  Multiple Vitamins-Minerals (CENTRUM SILVER 50+WOMEN) TABS Take 1 tablet by mouth daily.     ondansetron (ZOFRAN) 8 MG tablet ONE PILL EVERY 8 HOURS AS NEEDED FOR NAUSEA/VOMITTING. 40 tablet 1   rosuvastatin (CRESTOR) 5 MG tablet TAKE 1 TABLET (5 MG TOTAL) BY MOUTH DAILY. 90 tablet 3   feeding supplement (ENSURE ENLIVE / ENSURE PLUS) LIQD Take 237 mLs by mouth 3 (three) times daily between meals. (Patient not taking: Reported on 11/22/2021) 237 mL 12   No current facility-administered medications for this visit.   Facility-Administered Medications Ordered in Other Visits  Medication Dose Route Frequency Provider Last Rate Last Admin   heparin lock flush 100 unit/mL  500 Units Intravenous Once Charlaine Dalton R, MD       sodium chloride flush (NS) 0.9 % injection 10 mL  10 mL Intravenous Once Charlaine Dalton R, MD       sodium chloride flush (NS) 0.9 % injection 10 mL  10 mL Intravenous Once Cammie Sickle, MD        PHYSICAL EXAMINATION: ECOG  PERFORMANCE STATUS: 0 - Asymptomatic  BP 127/81 (BP Location: Right Arm, Patient Position: Sitting)   Pulse 65   Temp (!) 96.1 F (35.6 C) (Tympanic)   Wt 244 lb 8 oz (110.9 kg)   BMI 38.29 kg/m   Filed Weights   03/21/22 1100  Weight: 244 lb 8 oz (110.9 kg)    Physical Exam HENT:     Head: Normocephalic and atraumatic.     Mouth/Throat:     Pharynx: No oropharyngeal exudate.  Eyes:     Pupils: Pupils are equal, round, and reactive to light.  Cardiovascular:     Rate and Rhythm: Normal rate and regular rhythm.  Pulmonary:     Effort: No respiratory distress.     Breath sounds: No wheezing.  Abdominal:     General: Bowel sounds are normal. There is no distension.     Palpations: Abdomen is soft. There is no mass.     Tenderness: There is no abdominal tenderness. There is no guarding or rebound.  Musculoskeletal:        General: No tenderness. Normal range of motion.     Cervical back: Normal range of motion and neck supple.  Skin:    General: Skin is warm.  Neurological:     Mental Status: She is alert and oriented to person, place, and time.  Psychiatric:        Mood and Affect: Affect normal.    LABORATORY DATA:  I have reviewed the data as listed    Component Value Date/Time   NA 140 03/21/2022 1049   NA 145 (H) 09/08/2019 1359   NA 142 09/14/2013 1127   K 3.7 03/21/2022 1049   K 3.5 09/14/2013 1127   CL 101 03/21/2022 1049   CL 110 (H) 09/14/2013 1127   CO2 30 03/21/2022 1049   CO2 29 09/14/2013 1127   GLUCOSE 107 (H) 03/21/2022 1049   GLUCOSE 106 (H) 09/14/2013 1127   BUN 15 03/21/2022 1049   BUN 15 09/08/2019 1359   BUN 8 09/14/2013 1127   CREATININE 0.79 03/21/2022 1049   CREATININE 0.71 09/14/2013 1127   CREATININE 0.68 04/01/2013 1550   CALCIUM 9.0 03/21/2022 1049   CALCIUM 8.6 09/14/2013 1127   PROT 6.9 03/21/2022 1049   PROT 7.6 09/14/2013 1127   ALBUMIN 3.6 03/21/2022 1049   ALBUMIN 3.2 (L) 09/14/2013 1127   AST 22 03/21/2022 1049   AST  22 09/14/2013 1127   ALT 12 03/21/2022 1049   ALT 19 09/14/2013 1127   ALKPHOS 64 03/21/2022 1049   ALKPHOS 76 09/14/2013 1127   BILITOT 0.8 03/21/2022 1049   BILITOT 0.4 09/14/2013 1127   GFRNONAA >60 03/21/2022 1049   GFRNONAA >60 09/14/2013 1127   GFRAA >60 01/20/2020 1303   GFRAA >60 09/14/2013 1127    No results found for: "SPEP", "UPEP"  Lab Results  Component Value Date   WBC 9.1 03/21/2022   NEUTROABS 7.3 03/21/2022   HGB 13.6 03/21/2022   HCT 42.7 03/21/2022   MCV 92.8 03/21/2022   PLT 229 03/21/2022      Chemistry      Component Value Date/Time   NA 140 03/21/2022 1049   NA 145 (H) 09/08/2019 1359   NA 142 09/14/2013 1127   K 3.7 03/21/2022 1049   K 3.5 09/14/2013 1127   CL 101 03/21/2022 1049   CL 110 (H) 09/14/2013 1127   CO2 30 03/21/2022 1049   CO2 29 09/14/2013 1127   BUN 15 03/21/2022 1049   BUN 15 09/08/2019 1359   BUN 8 09/14/2013 1127   CREATININE 0.79 03/21/2022 1049   CREATININE 0.71 09/14/2013 1127   CREATININE 0.68 04/01/2013 1550      Component Value Date/Time   CALCIUM 9.0 03/21/2022 1049   CALCIUM 8.6 09/14/2013 1127   ALKPHOS 64 03/21/2022 1049   ALKPHOS 76 09/14/2013 1127   AST 22 03/21/2022 1049   AST 22 09/14/2013 1127   ALT 12 03/21/2022 1049   ALT 19 09/14/2013 1127   BILITOT 0.8 03/21/2022 1049   BILITOT 0.4 09/14/2013 1127       RADIOGRAPHIC STUDIES: I have personally reviewed the radiological images as listed and agreed with the findings in the report. No results found.   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    # Currently on Calequence 100 mg twice a day [ AUG 1st, 2022].  Tolerating well.  #  Immunocompromised state: Hx Multifocal pneumonia-[April 2023]-Given the multiple pneumonias/high risk of death-recommended continuation of further rituximab. April 2023- IgG- 652.  Monitor for now.  Check quantitative immunoglobulins at next visit.   # NICMP- [35-40%%- July 28th 2021]--  EVAL; NO CRT [Dr.End/Dr.Klein]July 2022- 2D echo 40 to 45% ejection fraction-   continue lasix 20 mg/day [sec to dizzy spells]; on coreg- defer to cardiology- STABLE.   #Chronic respiratory failure -2 L home O2 -NICMP/pulmonary hypertension/chronic bilateral lung scarring-followed by pulmonary [[place]]-  STABLE.   #Mild cough- ?  Question URI.  Recommend Tessalon Perles.  # Left shoulder pain/ Chronic arthritis-on meloxicam as needed- STABLE.    #Vaccinations : Proceed with flu shot- today.   fridays pt pref-  # DISPOSITION:  # flu shot today  # follow up-MD in 4 weeks//friday [ MD ;labs- cbc/cmp/ldh/BNP; quantitative immunoglobulin]- .Dr.B  # I reviewed the blood work- with the patient in detail; also reviewed the imaging independently [as summarized above]; and with the patient in detail.             Orders Placed This Encounter  Procedures   CBC with Differential/Platelet    Standing Status:   Future    Standing Expiration Date:   03/21/2023  Comprehensive metabolic panel    Standing Status:   Future    Standing Expiration Date:   03/21/2023   Lactate dehydrogenase    Standing Status:   Future    Standing Expiration Date:   03/22/2023   Brain natriuretic peptide    Standing Status:   Future    Standing Expiration Date:   03/22/2023   Immunoglobulins, QN, A/E/G/M    Standing Status:   Future    Standing Expiration Date:   03/22/2023     All questions were answered. The patient knows to call the clinic with any problems, questions or concerns.      Cammie Sickle, MD 03/21/2022 1:33 PM

## 2022-04-03 ENCOUNTER — Other Ambulatory Visit (HOSPITAL_COMMUNITY): Payer: Self-pay

## 2022-04-04 ENCOUNTER — Other Ambulatory Visit: Payer: Self-pay | Admitting: Physician Assistant

## 2022-04-08 ENCOUNTER — Other Ambulatory Visit (HOSPITAL_COMMUNITY): Payer: Self-pay

## 2022-04-09 ENCOUNTER — Telehealth: Payer: Self-pay | Admitting: Internal Medicine

## 2022-04-09 DIAGNOSIS — J9601 Acute respiratory failure with hypoxia: Secondary | ICD-10-CM | POA: Diagnosis not present

## 2022-04-09 DIAGNOSIS — G4733 Obstructive sleep apnea (adult) (pediatric): Secondary | ICD-10-CM | POA: Diagnosis not present

## 2022-04-09 DIAGNOSIS — I2699 Other pulmonary embolism without acute cor pulmonale: Secondary | ICD-10-CM | POA: Diagnosis not present

## 2022-04-09 NOTE — Telephone Encounter (Signed)
*  STAT* If patient is at the pharmacy, call can be transferred to refill team.   1. Which medications need to be refilled? (please list name of each medication and dose if known) apixaban (ELIQUIS) 5 MG TABS tablet   2. Which pharmacy/location (including street and city if local pharmacy) is medication to be sent to? [id number] [place]  3. Do they need a 30 day or 90 day supply? 30 day  Patient is completely out of medication

## 2022-04-09 NOTE — Telephone Encounter (Signed)
Refill Request.  

## 2022-04-10 ENCOUNTER — Other Ambulatory Visit (HOSPITAL_COMMUNITY): Payer: Self-pay

## 2022-04-10 MED ORDER — APIXABAN 5 MG PO TABS: 5.0000 mg | ORAL_TABLET | Freq: Two times a day (BID) | ORAL | 2 refills | Status: DC

## 2022-04-10 NOTE — Telephone Encounter (Signed)
Prescription refill request for Eliquis received. Indication: PE Last office visit: 01/22/22  C End MD Scr: 0.79 on 03/21/22 Age: 81 Weight: 113.4kg  Based on above findings Eliquis '5mg'$  twice daily is the appropriate dose.  Refill approved.

## 2022-04-11 ENCOUNTER — Encounter: Payer: Self-pay | Admitting: Internal Medicine

## 2022-04-14 ENCOUNTER — Other Ambulatory Visit (HOSPITAL_COMMUNITY): Payer: Self-pay

## 2022-04-14 ENCOUNTER — Encounter: Payer: Self-pay | Admitting: Internal Medicine

## 2022-04-18 ENCOUNTER — Inpatient Hospital Stay: Payer: Medicare Other | Attending: Internal Medicine

## 2022-04-18 ENCOUNTER — Inpatient Hospital Stay: Payer: Medicare Other | Admitting: Medical Oncology

## 2022-04-18 VITALS — BP 103/66 | HR 60 | Temp 96.8°F | Resp 16 | Wt 243.0 lb

## 2022-04-18 DIAGNOSIS — J9610 Chronic respiratory failure, unspecified whether with hypoxia or hypercapnia: Secondary | ICD-10-CM | POA: Diagnosis not present

## 2022-04-18 DIAGNOSIS — J189 Pneumonia, unspecified organism: Secondary | ICD-10-CM | POA: Diagnosis not present

## 2022-04-18 DIAGNOSIS — C8311 Mantle cell lymphoma, lymph nodes of head, face, and neck: Secondary | ICD-10-CM | POA: Insufficient documentation

## 2022-04-18 DIAGNOSIS — Z5111 Encounter for antineoplastic chemotherapy: Secondary | ICD-10-CM | POA: Diagnosis not present

## 2022-04-18 DIAGNOSIS — D8489 Other immunodeficiencies: Secondary | ICD-10-CM | POA: Diagnosis not present

## 2022-04-18 DIAGNOSIS — I428 Other cardiomyopathies: Secondary | ICD-10-CM | POA: Insufficient documentation

## 2022-04-18 DIAGNOSIS — Z7964 Long term (current) use of myelosuppressive agent: Secondary | ICD-10-CM | POA: Insufficient documentation

## 2022-04-18 DIAGNOSIS — E876 Hypokalemia: Secondary | ICD-10-CM | POA: Diagnosis not present

## 2022-04-18 DIAGNOSIS — M25512 Pain in left shoulder: Secondary | ICD-10-CM | POA: Insufficient documentation

## 2022-04-18 DIAGNOSIS — Z79899 Other long term (current) drug therapy: Secondary | ICD-10-CM | POA: Insufficient documentation

## 2022-04-18 LAB — LACTATE DEHYDROGENASE: LDH: 152 U/L (ref 98–192)

## 2022-04-18 LAB — CBC WITH DIFFERENTIAL/PLATELET
Abs Immature Granulocytes: 0.06 K/uL (ref 0.00–0.07)
Basophils Absolute: 0 K/uL (ref 0.0–0.1)
Basophils Relative: 0 %
Eosinophils Absolute: 0.1 K/uL (ref 0.0–0.5)
Eosinophils Relative: 1 %
HCT: 39.9 % (ref 36.0–46.0)
Hemoglobin: 12.6 g/dL (ref 12.0–15.0)
Immature Granulocytes: 1 %
Lymphocytes Relative: 17 %
Lymphs Abs: 1.6 K/uL (ref 0.7–4.0)
MCH: 29.5 pg (ref 26.0–34.0)
MCHC: 31.6 g/dL (ref 30.0–36.0)
MCV: 93.4 fL (ref 80.0–100.0)
Monocytes Absolute: 0.4 K/uL (ref 0.1–1.0)
Monocytes Relative: 5 %
Neutro Abs: 6.9 K/uL (ref 1.7–7.7)
Neutrophils Relative %: 76 %
Platelets: 213 K/uL (ref 150–400)
RBC: 4.27 MIL/uL (ref 3.87–5.11)
RDW: 14.6 % (ref 11.5–15.5)
WBC: 9.1 K/uL (ref 4.0–10.5)
nRBC: 0 % (ref 0.0–0.2)

## 2022-04-18 LAB — BRAIN NATRIURETIC PEPTIDE: B Natriuretic Peptide: 76.3 pg/mL (ref 0.0–100.0)

## 2022-04-18 LAB — COMPREHENSIVE METABOLIC PANEL WITH GFR
ALT: 13 U/L (ref 0–44)
AST: 23 U/L (ref 15–41)
Albumin: 3.5 g/dL (ref 3.5–5.0)
Alkaline Phosphatase: 58 U/L (ref 38–126)
Anion gap: 10 (ref 5–15)
BUN: 13 mg/dL (ref 8–23)
CO2: 30 mmol/L (ref 22–32)
Calcium: 9.1 mg/dL (ref 8.9–10.3)
Chloride: 98 mmol/L (ref 98–111)
Creatinine, Ser: 0.6 mg/dL (ref 0.44–1.00)
GFR, Estimated: >60 mL/min
Glucose, Bld: 108 mg/dL — ABNORMAL HIGH (ref 70–99)
Potassium: 3.1 mmol/L — ABNORMAL LOW (ref 3.5–5.1)
Sodium: 138 mmol/L (ref 135–145)
Total Bilirubin: 0.5 mg/dL (ref 0.3–1.2)
Total Protein: 6.5 g/dL (ref 6.5–8.1)

## 2022-04-18 MED ORDER — POTASSIUM CHLORIDE CRYS ER 10 MEQ PO TBCR: 10.0000 meq | EXTENDED_RELEASE_TABLET | Freq: Every day | ORAL | 2 refills | Status: DC

## 2022-04-18 NOTE — Progress Notes (Signed)
[place] OFFICE PROGRESS NOTE  Patient Care Team: Burnard Hawthorne, FNP as PCP - General (Family Medicine) End, Harrell Gave, MD as PCP - Cardiology (Cardiology) Deboraha Sprang, MD as PCP - Electrophysiology (Cardiology) Jackolyn Confer, MD (Internal Medicine) Cammie Sickle, MD as Consulting Physician (Internal Medicine)   Cancer Staging  No matching staging information was found for the patient.   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 Feb 9th- START Benda-Ritux with neulasta; Prolonged neutropenia; DISCONT- Benda-Ritux;   # April 13 th 2017- START R-CHOP x1; severe/prolonged neutropenia; PET- CR; BMBx-Neg; Disc R-CHOP # June 2022- DIAGNOSIS: thickened cortex of 12 mm. An additional lymph node demonstrates a thickened cortex of 7 mm. A third lymph node demonstrates a mildly thickened cortex of 4 mm A. LYMPH NODE, LEFT AXILLA; ULTRASOUND-GUIDED BIOPSY:  - CD5+ MONOCLONAL B-CELL POPULATION; COMPATIBLE WITH INVOLVEMENT BY THE  PATIENT'S KNOWN MANTLE CELL LYMPHOMA.   #July 2022-second week-started ibrutinib 420 mg a dayx 1 week-stop because of severe rash.  Significant clinical response noted.  # # 26th MAY 2017- Start Rituxan q 44M Main OCT 12th 2017- PET NED.    # AUG 1st, 2022- start acalbrutinib. 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   Michelle [name] 81 y.o.  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  She reports that she is doing well. Cough has resolved. She is feeling back to baseline.   Patient admits compliance to Calquence.  Denies any headaches.Patient continues to be on 3 L of oxygen but as needed. She tried to not wear it but will discuss with pulmonology. No further hospitalizations.  No nausea no vomiting.  No diarrhea.  No chest pain no new lumps or bumps.  Review of Systems  Constitutional:  Negative for chills, diaphoresis, fever, malaise/fatigue and weight loss.  HENT:  Negative for nosebleeds and sore throat.   Eyes:  Negative for double vision.  Respiratory:  Negative for hemoptysis, sputum production and wheezing.   Cardiovascular:  Negative for chest pain, palpitations, orthopnea and leg swelling.  Gastrointestinal:  Negative for abdominal pain, blood in stool, constipation, diarrhea, heartburn, melena, nausea and vomiting.  Genitourinary:  Negative for dysuria, frequency and urgency.  Musculoskeletal:  Positive for back pain and joint pain.  Skin: Negative.  Negative for itching and rash.  Neurological:  Negative for dizziness, tingling, focal weakness, weakness and headaches.  Endo/Heme/Allergies:  Does not bruise/bleed easily.  Psychiatric/Behavioral:  Negative for depression. The patient is not nervous/anxious and does not have insomnia.     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) (HCC)    Nonischemic cardiomyopathy, LVEF as low as 30-35% in 08/2019   History of methotrexate therapy    Hyperlipidemia    hx   Lymphadenopathy of head and neck 01/2015   see on Thyroid ultrasound    Lymphoma, mantle cell ([place]) 06/01/2015   bx of lymph node in right breast/Stage IV Mantle Cell Lymphoma   Multifocal pneumonia 10/05/2021   Personal history of chemotherapy    Pulmonary embolism ([place]) 10/05/2021   acute   Respiratory failure ([place]) 10/05/2021   acute   Rheumatoid arthritis ([place])     PAST SURGICAL HISTORY :   Past Surgical History:  Procedure Laterality Date   BREAST BIOPSY Left 11/07/2020   u/s bx-hydromark #3 "coil"-path pending   CARDIAC CATHETERIZATION  09/2004   [facility]; EF 60%   CARDIAC CATHETERIZATION  08/2004   [facility]   IR FLUORO GUIDED NEEDLE PLC ASPIRATION/INJECTION LOC  06/18/2018   PERIPHERAL VASCULAR CATHETERIZATION N/A 07/04/2015   Procedure: [name] Insertion;  Surgeon: [name], MD;  Location: [facility];  Service: Cardiovascular;  Laterality: N/A;   PORTA CATH REMOVAL N/A 06/23/2018   Procedure: PORTA CATH REMOVAL;  Surgeon: [name], MD;  Location: [facility];  Service: Cardiovascular;  Laterality: N/A;   RIGHT/LEFT HEART CATH AND CORONARY ANGIOGRAPHY Bilateral 09/20/2019   Procedure: RIGHT/LEFT HEART CATH AND CORONARY ANGIOGRAPHY;  Surgeon: Nelva Bush, MD;  Location: [facility];  Service: Cardiovascular;  Laterality: Bilateral;    FAMILY HISTORY :   Family History  Problem Relation Age of Onset   Diabetes Mother    Cholelithiasis Mother    Hypertension Sister    Diabetes Sister    Heart murmur Sister    Arthritis Brother    Breast cancer Neg Hx     SOCIAL HISTORY:   Social History   Tobacco Use   Smoking status: Never   Smokeless tobacco: Never  Vaping Use   Vaping Use: Never used  Substance Use Topics   Alcohol use: No   Drug use: No    ALLERGIES:  has No Known Allergies.  MEDICATIONS:  Current Outpatient Medications  Medication Sig Dispense Refill   potassium chloride (KLOR-CON M) 10 MEQ tablet Take 1 tablet (10 mEq total) by mouth daily. 30 tablet 2   acalabrutinib maleate (CALQUENCE)  100 MG tablet Take 1 tablet (100 mg total) by mouth 2 (two) times daily. 60 tablet 2   acetaminophen (TYLENOL) 500 MG tablet Take 1,000 mg by mouth every 6 (six) hours as needed for mild pain.      albuterol (VENTOLIN HFA) 108 (90 Base) MCG/ACT inhaler 2 puffs inhaled orally every 4 to 6 hours as needed, not to exceed 12 puffs in 24 hours 18 each 1   apixaban (ELIQUIS) 5 MG TABS tablet Take 1 tablet (5 mg total) by mouth 2 (two) times daily. 60 tablet 2   benzonatate (TESSALON) 100 MG capsule Take 1 capsule (100 mg total) by mouth 2 (two) times daily as needed for cough. 40 capsule 0   carvedilol (COREG) 3.125 MG tablet TAKE 1 TABLET BY MOUTH TWICE A DAY 180 tablet 0   feeding supplement (ENSURE ENLIVE / ENSURE PLUS) LIQD Take 237 mLs by mouth 3 (three) times daily between meals. (Patient not taking: Reported on 11/22/2021) 237 mL 12   fluticasone (FLONASE) 50 MCG/ACT nasal spray PLACE 2 SPRAYS INTO BOTH NOSTRILS DAILY AS NEEDED FOR ALLERGIES. [place]  mL 1   furosemide (LASIX) 40 MG tablet TAKE 1 TABLET BY MOUTH EVERY DAY 90 tablet 0   ipratropium-albuterol (DUONEB) 0.5-2.5 (3) MG/3ML SOLN Take 3 mLs by nebulization every 6 (six) hours as needed. 1080 mL 0   MEGARED OMEGA-3 KRILL OIL PO Take 1 tablet by mouth daily.     Multiple Vitamins-Minerals (CENTRUM SILVER 50+WOMEN) TABS Take 1 tablet by mouth daily.     ondansetron (ZOFRAN) 8 MG tablet ONE PILL EVERY 8 HOURS AS NEEDED FOR NAUSEA/VOMITTING. 40 tablet 1   rosuvastatin (CRESTOR) 5 MG tablet TAKE 1 TABLET (5 MG TOTAL) BY MOUTH DAILY. 90 tablet 3   No current facility-administered medications for this visit.   Facility-Administered Medications Ordered in Other Visits  Medication Dose Route Frequency Provider Last Rate Last Admin   heparin lock flush 100 unit/mL  500 Units Intravenous Once Charlaine Dalton R, MD       sodium chloride flush (NS) 0.9 % injection 10 mL  10 mL Intravenous Once Charlaine Dalton R, MD       sodium chloride flush (NS)  0.9 % injection 10 mL  10 mL Intravenous Once Cammie Sickle, MD        PHYSICAL EXAMINATION: ECOG PERFORMANCE STATUS: 0 - Asymptomatic  BP 103/66   Pulse 60   Temp (!) 96.8 F (36 C) (Tympanic)   Resp 16   Wt 243 lb (110.2 kg)   SpO2 99%   BMI 38.06 kg/m   Filed Weights   04/18/22 1125  Weight: 243 lb (110.2 kg)    Physical Exam Constitutional:      Comments: In wheelchair   HENT:     Head: Normocephalic and atraumatic.     Mouth/Throat:     Pharynx: No oropharyngeal exudate.  Eyes:     Pupils: Pupils are equal, round, and reactive to light.  Cardiovascular:     Rate and Rhythm: Normal rate and regular rhythm.  Pulmonary:     Effort: No respiratory distress.     Breath sounds: No wheezing.  Abdominal:     General: Bowel sounds are normal. There is no distension.     Palpations: Abdomen is soft. There is no mass.     Tenderness: There is no abdominal tenderness. There is no guarding or rebound.  Musculoskeletal:        General: No tenderness. Normal range of motion.     Cervical back: Normal range of motion and neck supple.  Skin:    General: Skin is warm.  Neurological:     Mental Status: She is alert and oriented to person, place, and time.  Psychiatric:        Mood and Affect: Affect normal.    LABORATORY DATA:  I have reviewed the data as listed    Component Value Date/Time   NA 138 04/18/2022 1056   NA 145 (H) 09/08/2019 1359   NA 142 09/14/2013 1127   K 3.1 (L) 04/18/2022 1056   K 3.5 09/14/2013 1127   CL 98 04/18/2022 1056   CL 110 (H) 09/14/2013 1127   CO2 30 04/18/2022 1056   CO2 29 09/14/2013 1127   GLUCOSE 108 (H) 04/18/2022 1056   GLUCOSE 106 (H) 09/14/2013 1127   BUN 13 04/18/2022 1056   BUN 15 09/08/2019 1359   BUN 8 09/14/2013 1127   CREATININE 0.60 04/18/2022 1056   CREATININE 0.71 09/14/2013 1127   CREATININE 0.68 04/01/2013 1550   CALCIUM 9.1 04/18/2022 1056   CALCIUM  8.6 09/14/2013 1127   PROT 6.5 04/18/2022 1056    PROT 7.6 09/14/2013 1127   ALBUMIN 3.5 04/18/2022 1056   ALBUMIN 3.2 (L) 09/14/2013 1127   AST 23 04/18/2022 1056   AST 22 09/14/2013 1127   ALT 13 04/18/2022 1056   ALT 19 09/14/2013 1127   ALKPHOS 58 04/18/2022 1056   ALKPHOS 76 09/14/2013 1127   BILITOT 0.5 04/18/2022 1056   BILITOT 0.4 09/14/2013 1127   GFRNONAA >60 04/18/2022 1056   GFRNONAA >60 09/14/2013 1127   GFRAA >60 01/20/2020 1303   GFRAA >60 09/14/2013 1127    No results found for: "SPEP", "UPEP"  Lab Results  Component Value Date   WBC 9.1 04/18/2022   NEUTROABS 6.9 04/18/2022   HGB 12.6 04/18/2022   HCT 39.9 04/18/2022   MCV 93.4 04/18/2022   PLT 213 04/18/2022      Chemistry      Component Value Date/Time   NA 138 04/18/2022 1056   NA 145 (H) 09/08/2019 1359   NA 142 09/14/2013 1127   K 3.1 (L) 04/18/2022 1056   K 3.5 09/14/2013 1127   CL 98 04/18/2022 1056   CL 110 (H) 09/14/2013 1127   CO2 30 04/18/2022 1056   CO2 29 09/14/2013 1127   BUN 13 04/18/2022 1056   BUN 15 09/08/2019 1359   BUN 8 09/14/2013 1127   CREATININE 0.60 04/18/2022 1056   CREATININE 0.71 09/14/2013 1127   CREATININE 0.68 04/01/2013 1550      Component Value Date/Time   CALCIUM 9.1 04/18/2022 1056   CALCIUM 8.6 09/14/2013 1127   ALKPHOS 58 04/18/2022 1056   ALKPHOS 76 09/14/2013 1127   AST 23 04/18/2022 1056   AST 22 09/14/2013 1127   ALT 13 04/18/2022 1056   ALT 19 09/14/2013 1127   BILITOT 0.5 04/18/2022 1056   BILITOT 0.4 09/14/2013 1127       RADIOGRAPHIC STUDIES: I have personally reviewed the radiological images as listed and agreed with the findings in the report. No results found.   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     # Currently on Calequence 100 mg twice a day [ AUG 1st, 2022].  Tolerating well without any side effects. Labs are trending down- immunoglobulins pending at time of visit.    # Immunocompromised state: Hx Multifocal pneumonia-[April 2023]-Given the multiple pneumonias/high risk of death-recommended continuation of further rituximab. April 2023- IgG- 652.  Monitor for now.  Quantitative immunoglobulins to be checked today. Pending at time of visit.    # NICMP- [35-40%%- July 28th 2021]--  EVAL; NO CRT [Dr.End/Dr.Klein]July 2022- 2D echo 40 to 45% ejection fraction-   continue lasix 20 mg/day [sec to dizzy spells]; on coreg- defer to cardiology- STABLE. No changes to plan today    #Chronic respiratory failure -2 L home O2 -NICMP/pulmonary hypertension/chronic bilateral lung scarring-followed by pulmonary [[place]]-  STABLE.    #Mild cough- Resolved.    # Left shoulder pain. Chronic thought to be secondary to arthritis-on meloxicam PRN- STABLE.   # Hypokalemia: New. Will send in rx supplementation and have her eat a banana per day as well. Recheck in 1 month.     fridays pt pref-  Disposition RTC 1 months MD,  labs (CBC w/, CMP, LDH, BNP, Immunoglobulins)-[place]    No orders of the defined types were placed in this encounter.    All questions were answered. The patient knows to call the clinic with any problems, questions or concerns.      Hughie Closs, PA-C 04/18/2022 11:46 AM

## 2022-04-21 LAB — IMMUNOGLOBULINS A/E/G/M, SERUM
IgA: 99 mg/dL (ref 64–422)
IgE (Immunoglobulin E), Serum: <2 IU/mL — ABNORMAL LOW (ref 6–495)
IgG (Immunoglobin G), Serum: 825 mg/dL (ref 586–1602)
IgM (Immunoglobulin M), Srm: 10 mg/dL — ABNORMAL LOW (ref 26–217)

## 2022-04-23 DIAGNOSIS — G4733 Obstructive sleep apnea (adult) (pediatric): Secondary | ICD-10-CM | POA: Diagnosis not present

## 2022-04-23 DIAGNOSIS — J9601 Acute respiratory failure with hypoxia: Secondary | ICD-10-CM | POA: Diagnosis not present

## 2022-04-23 DIAGNOSIS — I2699 Other pulmonary embolism without acute cor pulmonale: Secondary | ICD-10-CM | POA: Diagnosis not present

## 2022-05-01 ENCOUNTER — Other Ambulatory Visit (HOSPITAL_COMMUNITY): Payer: Self-pay

## 2022-05-07 ENCOUNTER — Encounter: Payer: Self-pay | Admitting: Internal Medicine

## 2022-05-07 ENCOUNTER — Ambulatory Visit: Payer: Medicare Other | Attending: Internal Medicine | Admitting: Internal Medicine

## 2022-05-07 VITALS — BP 110/70 | HR 63 | Ht 67.0 in | Wt 241.0 lb

## 2022-05-07 DIAGNOSIS — I5022 Chronic systolic (congestive) heart failure: Secondary | ICD-10-CM | POA: Diagnosis not present

## 2022-05-07 DIAGNOSIS — Z86711 Personal history of pulmonary embolism: Secondary | ICD-10-CM

## 2022-05-07 DIAGNOSIS — C8311 Mantle cell lymphoma, lymph nodes of head, face, and neck: Secondary | ICD-10-CM | POA: Diagnosis not present

## 2022-05-07 DIAGNOSIS — R0602 Shortness of breath: Secondary | ICD-10-CM

## 2022-05-07 DIAGNOSIS — I272 Pulmonary hypertension, unspecified: Secondary | ICD-10-CM

## 2022-05-07 MED ORDER — APIXABAN 5 MG PO TABS: 5.0000 mg | ORAL_TABLET | Freq: Two times a day (BID) | ORAL | 3 refills | Status: DC

## 2022-05-07 NOTE — Progress Notes (Unsigned)
Follow-up Outpatient Visit Date: 05/07/2022  Primary Care Provider: Burnard Hawthorne, FNP [address] Michelle [name] [address] [place] [address]  Chief Complaint: Shortness of breath  HPI:  Michelle [name] is a 81 y.o. 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  I last saw her in August, at which time she noted continued dyspnea on exertion following her hospitalization in the spring for PE, multifocal pneumonia, and COVID-19.  She was wearing supplemental O2 with activity.  We did not make any medication changes or pursue additional testing.  Today, [name] reports that she continues to have quite a bit of shortness of breath, maybe a little bit worse than at our prior visits.  She has considerable exertional dyspnea walking as little as 10 feet.  She is asymptomatic at rest.  She notes that a nurse from [facility] told her that she has lung crackles on exam this morning.  She has chronic dependent edema in her left leg, which is unchanged from baseline.  She denies chest pain and palpitations.  She began feeling dizzy a week or two ago.  She feels off balance, like things are spinning around her.  She has not passed out or fallen.  She ran out of her apixaban a few days ago.  --------------------------------------------------------------------------------------------------  Past Medical History:  Diagnosis Date   Arthritis    Collagen vascular disease ([place])    GERD (gastroesophageal reflux disease)    Headache(784.0)    HFrEF (heart failure with reduced ejection fraction) (HCC)    Nonischemic cardiomyopathy, LVEF as low as 30-35% in 08/2019   History of methotrexate therapy    Hyperlipidemia    hx   Lymphadenopathy of head and neck  01/2015   see on Thyroid ultrasound   Lymphoma, mantle cell ([place]) 06/01/2015   bx of lymph node in right breast/Stage IV Mantle Cell Lymphoma   Multifocal pneumonia 10/05/2021   Personal history of chemotherapy    Pulmonary embolism ([place]) 10/05/2021   acute   Respiratory failure ([place]) 10/05/2021   acute   Rheumatoid arthritis ([place])    Past Surgical History:  Procedure Laterality Date   BREAST BIOPSY Left 11/07/2020   u/s bx-hydromark #3 "coil"-path pending   CARDIAC CATHETERIZATION  09/2004   [facility]; EF 60%   CARDIAC CATHETERIZATION  08/2004   [facility]   IR FLUORO GUIDED NEEDLE PLC ASPIRATION/INJECTION LOC  06/18/2018   PERIPHERAL VASCULAR CATHETERIZATION N/A 07/04/2015   Procedure: [name] Insertion;  Surgeon: [name], MD;  Location: [facility];  Service: Cardiovascular;  Laterality: N/A;   PORTA CATH REMOVAL N/A 06/23/2018   Procedure: PORTA CATH REMOVAL;  Surgeon: [name], MD;  Location: [facility];  Service: Cardiovascular;  Laterality: N/A;   RIGHT/LEFT HEART CATH AND CORONARY ANGIOGRAPHY Bilateral 09/20/2019   Procedure: RIGHT/LEFT HEART CATH AND CORONARY ANGIOGRAPHY;  Surgeon: Nelva Bush, MD;  Location: [facility];  Service: Cardiovascular;  Laterality: Bilateral;    Current Meds  Medication Sig   acalabrutinib maleate (CALQUENCE) 100 MG tablet Take 1 tablet (100 mg total) by mouth 2 (two) times daily.   acetaminophen (TYLENOL) 500 MG tablet Take 1,000 mg by mouth every 6 (six) hours as needed for mild pain.    albuterol (VENTOLIN HFA) 108 (  90 Base) MCG/ACT inhaler 2 puffs inhaled orally every 4 to 6 hours as needed, not to exceed 12 puffs in 24 hours   benzonatate (TESSALON) 100 MG capsule Take 1 capsule (100 mg total) by mouth 2 (two) times daily as needed for cough.   carvedilol (COREG) 3.125 MG tablet TAKE 1 TABLET BY MOUTH TWICE A DAY   feeding supplement (ENSURE ENLIVE / ENSURE PLUS) LIQD Take 237 mLs by mouth 3 (three) times daily  between meals.   fluticasone (FLONASE) 50 MCG/ACT nasal spray PLACE 2 SPRAYS INTO BOTH NOSTRILS DAILY AS NEEDED FOR ALLERGIES.   furosemide (LASIX) 40 MG tablet TAKE 1 TABLET BY MOUTH EVERY DAY   ipratropium-albuterol (DUONEB) 0.5-2.5 (3) MG/3ML SOLN Take 3 mLs by nebulization every 6 (six) hours as needed.   Multiple Vitamins-Minerals (CENTRUM SILVER 50+WOMEN) TABS Take 1 tablet by mouth daily.   ondansetron (ZOFRAN) 8 MG tablet ONE PILL EVERY 8 HOURS AS NEEDED FOR NAUSEA/VOMITTING.   potassium chloride (KLOR-CON M) 10 MEQ tablet Take 1 tablet (10 mEq total) by mouth daily.   rosuvastatin (CRESTOR) 5 MG tablet TAKE 1 TABLET (5 MG TOTAL) BY MOUTH DAILY.    Allergies: Patient has no known allergies.  Social History   Tobacco Use   Smoking status: Never   Smokeless tobacco: Never  Vaping Use   Vaping Use: Never used  Substance Use Topics   Alcohol use: No   Drug use: No    Family History  Problem Relation Age of Onset   Diabetes Mother    Cholelithiasis Mother    Hypertension Sister    Diabetes Sister    Heart murmur Sister    Arthritis Brother    Breast cancer Neg Hx     Review of Systems: A 12-system review of systems was performed and was negative except as noted in the HPI.  --------------------------------------------------------------------------------------------------  Physical Exam: BP 110/70 (BP Location: Left Arm, Patient Position: Sitting, Cuff Size: Normal)   Pulse 63   Ht '5\' 7"'$  (1.702 m)   Wt 241 lb (109.3 kg)   SpO2 96% Comment: on 3L O2  BMI 37.75 kg/m   General:  NAD. Neck: No JVD or HJR. Lungs: Dry crackles in both lung bases.  Good air movement.  No wheezes. Heart: Regular rate and rhythm without murmurs, rubs, or gallops. Abdomen: Soft, nontender, nondistended. Extremities: Trace nonpitting edema in LLE.  Lab Results  Component Value Date   WBC 9.1 04/18/2022   HGB 12.6 04/18/2022   HCT 39.9 04/18/2022   MCV 93.4 04/18/2022   PLT 213  04/18/2022    Lab Results  Component Value Date   NA 138 04/18/2022   K 3.1 (L) 04/18/2022   CL 98 04/18/2022   CO2 30 04/18/2022   BUN 13 04/18/2022   CREATININE 0.60 04/18/2022   GLUCOSE 108 (H) 04/18/2022   ALT 13 04/18/2022    Lab Results  Component Value Date   CHOL 151 10/12/2020   HDL 64.70 10/12/2020   LDLCALC 77 10/12/2020   TRIG 44.0 10/12/2020   CHOLHDL 2 10/12/2020    --------------------------------------------------------------------------------------------------  ASSESSMENT AND PLAN: Chronic HFrEF with recovered ejection fraction and shortness of breath: Ms. Seiter reports slight progression of her exertional dyspnea but otherwise appears euvolemic on examination today.  Her weight has been trending down, also arguing against volume overload.  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  We have agreed  to repeat an echocardiogram to reassess her LVEF, right heart function, and PA pressure.  We will not make any medication changes today, continuing furosemide 40 mg daily.  History of pulmonary embolism: Ms. Mckell ran out of apixaban a few days ago.  Given her mantle cell lymphoma, I am unsure as to whether she would benefit from indefinite anticoagulation.  I will refill her apixaban today and reach out to Dr. Rogue Bussing for further recommendations regarding duration of AC.  Mantle cell lymphoma: Continue treatment/monitoring per Dr. Rogue Bussing.  Follow-up: Return to clinic in 1 month.  Nelva Bush, MD 05/07/2022 4:37 PM

## 2022-05-07 NOTE — Patient Instructions (Signed)
Medication Instructions:  Your Physician recommend you continue on your current medication as directed.    *If you need a refill on your cardiac medications before your next appointment, please call your pharmacy*   Lab Work: None ordered today   Testing/Procedures: Your physician has requested that you have an echocardiogram. Echocardiography is a painless test that uses sound waves to create images of your heart. It provides your doctor with information about the size and shape of your heart and how well your heart's chambers and valves are working.   You may receive an ultrasound enhancing agent through an IV if needed to better visualize your heart during the echo. This procedure takes approximately one hour.  There are no restrictions for this procedure.  This will take place at [facility] ([place] [id number], [place]    Follow-Up: At [facility], you and your health needs are our priority.  As part of our continuing mission to provide you with exceptional heart care, we have created designated Provider Care Teams.  These Care Teams include your primary Cardiologist (physician) and Advanced Practice Providers (APPs -  Physician Assistants and Nurse Practitioners) who all work together to provide you with the care you need, when you need it.  We recommend signing up for the patient portal called "MyChart".  Sign up information is provided on this After Visit Summary.  MyChart is used to connect with patients for Virtual Visits (Telemedicine).  Patients are able to view lab/test results, encounter notes, upcoming appointments, etc.  Non-urgent messages can be sent to your provider as well.   To learn more about what you can do with MyChart, go to [email or web address].    Your next appointment:   1 month(s)  The format for your next appointment:   In Person  Provider:   You may see Nelva Bush, MD or one of the following Advanced  Practice Providers on your designated Care Team:   Murray Hodgkins, NP Christell Faith, PA-C Cadence Kathlen Mody, PA-C Gerrie Nordmann, NP

## 2022-05-08 ENCOUNTER — Encounter: Payer: Self-pay | Admitting: Internal Medicine

## 2022-05-08 DIAGNOSIS — Z86711 Personal history of pulmonary embolism: Secondary | ICD-10-CM | POA: Insufficient documentation

## 2022-05-09 DIAGNOSIS — G4733 Obstructive sleep apnea (adult) (pediatric): Secondary | ICD-10-CM | POA: Diagnosis not present

## 2022-05-09 DIAGNOSIS — J9601 Acute respiratory failure with hypoxia: Secondary | ICD-10-CM | POA: Diagnosis not present

## 2022-05-09 DIAGNOSIS — I2699 Other pulmonary embolism without acute cor pulmonale: Secondary | ICD-10-CM | POA: Diagnosis not present

## 2022-05-13 ENCOUNTER — Other Ambulatory Visit (HOSPITAL_COMMUNITY): Payer: Self-pay

## 2022-05-14 ENCOUNTER — Other Ambulatory Visit: Payer: Self-pay

## 2022-05-14 DIAGNOSIS — C8311 Mantle cell lymphoma, lymph nodes of head, face, and neck: Secondary | ICD-10-CM

## 2022-05-16 ENCOUNTER — Inpatient Hospital Stay: Payer: Medicare Other | Attending: Internal Medicine

## 2022-05-16 ENCOUNTER — Inpatient Hospital Stay (HOSPITAL_BASED_OUTPATIENT_CLINIC_OR_DEPARTMENT_OTHER): Payer: Medicare Other | Admitting: Internal Medicine

## 2022-05-16 ENCOUNTER — Encounter: Payer: Self-pay | Admitting: Internal Medicine

## 2022-05-16 VITALS — BP 116/76 | HR 71 | Temp 98.1°F | Resp 18 | Wt 240.4 lb

## 2022-05-16 DIAGNOSIS — Z86711 Personal history of pulmonary embolism: Secondary | ICD-10-CM | POA: Diagnosis not present

## 2022-05-16 DIAGNOSIS — Z8616 Personal history of COVID-19: Secondary | ICD-10-CM | POA: Insufficient documentation

## 2022-05-16 DIAGNOSIS — I272 Pulmonary hypertension, unspecified: Secondary | ICD-10-CM | POA: Insufficient documentation

## 2022-05-16 DIAGNOSIS — J9610 Chronic respiratory failure, unspecified whether with hypoxia or hypercapnia: Secondary | ICD-10-CM | POA: Diagnosis not present

## 2022-05-16 DIAGNOSIS — Z79899 Other long term (current) drug therapy: Secondary | ICD-10-CM | POA: Diagnosis not present

## 2022-05-16 DIAGNOSIS — I5022 Chronic systolic (congestive) heart failure: Secondary | ICD-10-CM | POA: Insufficient documentation

## 2022-05-16 DIAGNOSIS — Z7901 Long term (current) use of anticoagulants: Secondary | ICD-10-CM | POA: Insufficient documentation

## 2022-05-16 DIAGNOSIS — C8311 Mantle cell lymphoma, lymph nodes of head, face, and neck: Secondary | ICD-10-CM | POA: Insufficient documentation

## 2022-05-16 LAB — CBC WITH DIFFERENTIAL/PLATELET
Abs Immature Granulocytes: 0.02 10*3/uL (ref 0.00–0.07)
Basophils Absolute: 0 10*3/uL (ref 0.0–0.1)
Basophils Relative: 0 %
Eosinophils Absolute: 0.1 10*3/uL (ref 0.0–0.5)
Eosinophils Relative: 1 %
HCT: 38.9 % (ref 36.0–46.0)
Hemoglobin: 12.5 g/dL (ref 12.0–15.0)
Immature Granulocytes: 0 %
Lymphocytes Relative: 20 %
Lymphs Abs: 1.6 10*3/uL (ref 0.7–4.0)
MCH: 29.5 pg (ref 26.0–34.0)
MCHC: 32.1 g/dL (ref 30.0–36.0)
MCV: 91.7 fL (ref 80.0–100.0)
Monocytes Absolute: 0.4 10*3/uL (ref 0.1–1.0)
Monocytes Relative: 5 %
Neutro Abs: 5.9 10*3/uL (ref 1.7–7.7)
Neutrophils Relative %: 74 %
Platelets: 215 10*3/uL (ref 150–400)
RBC: 4.24 MIL/uL (ref 3.87–5.11)
RDW: 14.9 % (ref 11.5–15.5)
WBC: 8 10*3/uL (ref 4.0–10.5)
nRBC: 0 % (ref 0.0–0.2)

## 2022-05-16 LAB — COMPREHENSIVE METABOLIC PANEL
ALT: 13 U/L (ref 0–44)
AST: 21 U/L (ref 15–41)
Albumin: 3.6 g/dL (ref 3.5–5.0)
Alkaline Phosphatase: 71 U/L (ref 38–126)
Anion gap: 11 (ref 5–15)
BUN: 16 mg/dL (ref 8–23)
CO2: 27 mmol/L (ref 22–32)
Calcium: 8.8 mg/dL — ABNORMAL LOW (ref 8.9–10.3)
Chloride: 100 mmol/L (ref 98–111)
Creatinine, Ser: 0.87 mg/dL (ref 0.44–1.00)
GFR, Estimated: >60 mL/min (ref >60)
Glucose, Bld: 103 mg/dL — ABNORMAL HIGH (ref 70–99)
Potassium: 3.9 mmol/L (ref 3.5–5.1)
Sodium: 138 mmol/L (ref 135–145)
Total Bilirubin: 0.5 mg/dL (ref 0.3–1.2)
Total Protein: 6.8 g/dL (ref 6.5–8.1)

## 2022-05-16 LAB — LACTATE DEHYDROGENASE: LDH: 143 U/L (ref 98–192)

## 2022-05-16 LAB — BRAIN NATRIURETIC PEPTIDE: B Natriuretic Peptide: 47.4 pg/mL (ref 0.0–100.0)

## 2022-05-16 NOTE — Assessment & Plan Note (Signed)
#  Mantle cell lymphoma recurrent biopsy-proven [July 2022]; Currently on Calquence [ DISCONTINUED  Rituximab-given the multifocal pneumonia]-  PET scan OCT 13th, 2023- complete response;  No evidence non-Hodgkin's lymphoma/mantle cell recurrence or progression.    # Currently on Calequence 100 mg twice a day [ AUG 1st, 2022].  Tolerating well- STABLE.   # March 31st, 2023- right upper lobe pulmonary artery branches  consistent with pulmonary embolism- on eliquis [1 year- until March 2024]-   # Immunocompromised state: Hx Multifocal pneumonia-[April 2023]-Given the multiple pneumonias/high risk of death-recommended continuation of further rituximab. SEP 2023- IgG- 825.  Monitor for now.    # NICMP- [35-40%%- July 28th 2021]--  EVAL; NO CRT [Dr.End/Dr.Klein]July 2022- 2D echo 40 to 45% ejection fraction-  continue lasix 20 mg/day [sec to dizzy spells]; on coreg- defer to cardiology- STABLE.   #Chronic respiratory failure -2 L home O2 -NICMP/pulmonary hypertension/chronic bilateral lung scarring-followed by pulmonary [[place]]-  STABLE.   # Left shoulder pain/ Chronic arthritis-on meloxicam as needed- STABLE.   #Vaccinations : s/p flu shot- today; discussed r: COVID booster/RSV; ? Pneumonia vaccination [check with PCP] fridays pt pref-  # DISPOSITION:  # follow up-MD in 4 weeks//friday [MD ;labs- cbc/cmp/ldh/BNP; quantitative immunoglobulin]- .Dr.B

## 2022-05-16 NOTE — Progress Notes (Unsigned)
Patient denies new problems/concerns today.   °

## 2022-05-16 NOTE — Progress Notes (Unsigned)
[name] OFFICE PROGRESS NOTE  Patient Care Team: Burnard Hawthorne, FNP as PCP - General (Family Medicine) End, Harrell Gave, MD as PCP - Cardiology (Cardiology) Deboraha Sprang, MD as PCP - Electrophysiology (Cardiology) Jackolyn Confer, MD (Internal Medicine) Cammie Sickle, MD as Consulting Physician (Internal Medicine)   Cancer Staging  No matching staging information was found for the patient.   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 Feb 9th- START Benda-Ritux with neulasta; Prolonged neutropenia; DISCONT- Benda-Ritux;   # April 13 th 2017- START R-CHOP x1; severe/prolonged neutropenia; PET- CR; BMBx-Neg; Disc R-CHOP # June 2022- DIAGNOSIS: thickened cortex of 12 mm. An additional lymph node demonstrates a thickened cortex of 7 mm. A third lymph node demonstrates a mildly thickened cortex of 4 mm A. LYMPH NODE, LEFT AXILLA; ULTRASOUND-GUIDED BIOPSY:  - CD5+ MONOCLONAL B-CELL POPULATION; COMPATIBLE WITH INVOLVEMENT BY THE  PATIENT'S KNOWN MANTLE CELL LYMPHOMA.   #July 2022-second week-started ibrutinib 420 mg a dayx 1 week-stop because of severe rash.  Significant clinical response noted.  # # 26th MAY 2017- Start Rituxan q 95M Main OCT 12th 2017- PET NED.    # AUG 1st, 2022- start acalbrutinib. 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    Michelle [name] 81 y.o.  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  Patient denies new problems/concerns today. Currently walking with walker.   Has a cough when lying on left side. since she was diagnosed with COVD and pneumonia.  Chronic mild cough not getting worse.  Improved with Robitussin.  Patient admits compliance to Calquence.  Denies any headaches.Patient continues to be on 2 L of oxygen but as needed.  No further hospitalizations.  No nausea no vomiting.  No diarrhea.  No chest pain no new lumps or bumps.  Review of Systems  Constitutional:  Negative for chills, diaphoresis, fever, malaise/fatigue and weight loss.  HENT:  Negative for nosebleeds and sore throat.   Eyes:  Negative for double vision.  Respiratory:  Negative for hemoptysis, sputum production and wheezing.   Cardiovascular:  Negative for chest pain, palpitations, orthopnea and leg swelling.  Gastrointestinal:  Positive for constipation. Negative for abdominal pain, blood in stool, diarrhea, heartburn, melena, nausea and vomiting.  Genitourinary:  Negative for dysuria, frequency and urgency.  Musculoskeletal:  Positive for back pain and joint pain.  Skin: Negative.  Negative for itching and rash.  Neurological:  Negative for dizziness, tingling, focal weakness, weakness and headaches.  Endo/Heme/Allergies:  Does not bruise/bleed easily.  Psychiatric/Behavioral:  Negative for depression. The patient is not nervous/anxious and does not have insomnia.     PAST MEDICAL HISTORY :  Past Medical History:  Diagnosis Date   Arthritis    Collagen vascular disease ([place])    GERD (gastroesophageal reflux disease)    Headache(784.0)  HFrEF (heart failure with reduced ejection fraction) (HCC)    Nonischemic cardiomyopathy, LVEF as low as 30-35% in 08/2019   History of methotrexate therapy    Hyperlipidemia    hx   Lymphadenopathy of head and neck  01/2015   see on Thyroid ultrasound   Lymphoma, mantle cell ([place]) 06/01/2015   bx of lymph node in right breast/Stage IV Mantle Cell Lymphoma   Multifocal pneumonia 10/05/2021   Personal history of chemotherapy    Pulmonary embolism ([place]) 10/05/2021   acute   Respiratory failure ([place]) 10/05/2021   acute   Rheumatoid arthritis ([place])     PAST SURGICAL HISTORY :   Past Surgical History:  Procedure Laterality Date   BREAST BIOPSY Left 11/07/2020   u/s bx-hydromark #3 "coil"-path pending   CARDIAC CATHETERIZATION  09/2004   [facility]; EF 60%   CARDIAC CATHETERIZATION  08/2004   [facility]   IR FLUORO GUIDED NEEDLE PLC ASPIRATION/INJECTION LOC  06/18/2018   PERIPHERAL VASCULAR CATHETERIZATION N/A 07/04/2015   Procedure: [name] Insertion;  Surgeon: [name], MD;  Location: [facility];  Service: Cardiovascular;  Laterality: N/A;   PORTA CATH REMOVAL N/A 06/23/2018   Procedure: PORTA CATH REMOVAL;  Surgeon: [name], MD;  Location: [facility];  Service: Cardiovascular;  Laterality: N/A;   RIGHT/LEFT HEART CATH AND CORONARY ANGIOGRAPHY Bilateral 09/20/2019   Procedure: RIGHT/LEFT HEART CATH AND CORONARY ANGIOGRAPHY;  Surgeon: Nelva Bush, MD;  Location: [facility];  Service: Cardiovascular;  Laterality: Bilateral;    FAMILY HISTORY :   Family History  Problem Relation Age of Onset   Diabetes Mother    Cholelithiasis Mother    Hypertension Sister    Diabetes Sister    Heart murmur Sister    Arthritis Brother    Breast cancer Neg Hx     SOCIAL HISTORY:   Social History   Tobacco Use   Smoking status: Never   Smokeless tobacco: Never  Vaping Use   Vaping Use: Never used  Substance Use Topics   Alcohol use: No   Drug use: No    ALLERGIES:  has No Known Allergies.  MEDICATIONS:  Current Outpatient Medications  Medication Sig Dispense Refill   acalabrutinib maleate (CALQUENCE) 100 MG tablet Take 1 tablet (100 mg total) by mouth 2 (two) times  daily. 60 tablet 2   acetaminophen (TYLENOL) 500 MG tablet Take 1,000 mg by mouth every 6 (six) hours as needed for mild pain.      albuterol (VENTOLIN HFA) 108 (90 Base) MCG/ACT inhaler 2 puffs inhaled orally every 4 to 6 hours as needed, not to exceed 12 puffs in 24 hours 18 each 1   apixaban (ELIQUIS) 5 MG TABS tablet Take 1 tablet (5 mg total) by mouth 2 (two) times daily. 90 tablet 3   benzonatate (TESSALON) 100 MG capsule Take 1 capsule (100 mg total) by mouth 2 (two) times daily as needed for cough. 40 capsule 0   carvedilol (COREG) 3.125 MG tablet TAKE 1 TABLET BY MOUTH TWICE A DAY 180 tablet 0   feeding supplement (ENSURE ENLIVE / ENSURE PLUS) LIQD Take 237 mLs by mouth 3 (three) times daily between meals. 237 mL 12   fluticasone (FLONASE) 50 MCG/ACT nasal spray PLACE 2 SPRAYS INTO BOTH NOSTRILS DAILY AS NEEDED FOR ALLERGIES. 48 mL 1   furosemide (LASIX) 40 MG tablet TAKE 1 TABLET BY MOUTH EVERY DAY 90 tablet 0   ipratropium-albuterol (DUONEB) 0.5-2.5 (3) MG/3ML  SOLN Take 3 mLs by nebulization every 6 (six) hours as needed. 1080 mL 0   Multiple Vitamins-Minerals (CENTRUM SILVER 50+WOMEN) TABS Take 1 tablet by mouth daily.     ondansetron (ZOFRAN) 8 MG tablet ONE PILL EVERY 8 HOURS AS NEEDED FOR NAUSEA/VOMITTING. 40 tablet 1   potassium chloride (KLOR-CON M) 10 MEQ tablet Take 1 tablet (10 mEq total) by mouth daily. 30 tablet 2   rosuvastatin (CRESTOR) 5 MG tablet TAKE 1 TABLET (5 MG TOTAL) BY MOUTH DAILY. 90 tablet 3   MEGARED OMEGA-3 KRILL OIL PO Take 1 tablet by mouth daily. (Patient not taking: Reported on 05/07/2022)     No current facility-administered medications for this visit.   Facility-Administered Medications Ordered in Other Visits  Medication Dose Route Frequency Provider Last Rate Last Admin   heparin lock flush 100 unit/mL  500 Units Intravenous Once Charlaine Dalton R, MD       sodium chloride flush (NS) 0.9 % injection 10 mL  10 mL Intravenous Once Charlaine Dalton  R, MD       sodium chloride flush (NS) 0.9 % injection 10 mL  10 mL Intravenous Once Cammie Sickle, MD        PHYSICAL EXAMINATION: ECOG PERFORMANCE STATUS: 0 - Asymptomatic  BP 116/76 (BP Location: Left Arm, Patient Position: Sitting)   Pulse 71   Temp 98.1 F (36.7 C) (Tympanic)   Resp 18   Wt 240 lb 6.4 oz (109 kg)   BMI 37.65 kg/m   Filed Weights   05/16/22 1300  Weight: 240 lb 6.4 oz (109 kg)    Physical Exam HENT:     Head: Normocephalic and atraumatic.     Mouth/Throat:     Pharynx: No oropharyngeal exudate.  Eyes:     Pupils: Pupils are equal, round, and reactive to light.  Cardiovascular:     Rate and Rhythm: Normal rate and regular rhythm.  Pulmonary:     Effort: No respiratory distress.     Breath sounds: No wheezing.  Abdominal:     General: Bowel sounds are normal. There is no distension.     Palpations: Abdomen is soft. There is no mass.     Tenderness: There is no abdominal tenderness. There is no guarding or rebound.  Musculoskeletal:        General: No tenderness. Normal range of motion.     Cervical back: Normal range of motion and neck supple.  Skin:    General: Skin is warm.  Neurological:     Mental Status: She is alert and oriented to person, place, and time.  Psychiatric:        Mood and Affect: Affect normal.    LABORATORY DATA:  I have reviewed the data as listed    Component Value Date/Time   NA 138 05/16/2022 1252   NA 145 (H) 09/08/2019 1359   NA 142 09/14/2013 1127   K 3.9 05/16/2022 1252   K 3.5 09/14/2013 1127   CL 100 05/16/2022 1252   CL 110 (H) 09/14/2013 1127   CO2 27 05/16/2022 1252   CO2 29 09/14/2013 1127   GLUCOSE 103 (H) 05/16/2022 1252   GLUCOSE 106 (H) 09/14/2013 1127   BUN 16 05/16/2022 1252   BUN 15 09/08/2019 1359   BUN 8 09/14/2013 1127   CREATININE 0.87 05/16/2022 1252   CREATININE 0.71 09/14/2013 1127   CREATININE 0.68 04/01/2013 1550   CALCIUM 8.8 (L) 05/16/2022 1252   CALCIUM 8.6 09/14/2013  1127  PROT 6.8 05/16/2022 1252   PROT 7.6 09/14/2013 1127   ALBUMIN 3.6 05/16/2022 1252   ALBUMIN 3.2 (L) 09/14/2013 1127   AST 21 05/16/2022 1252   AST 22 09/14/2013 1127   ALT 13 05/16/2022 1252   ALT 19 09/14/2013 1127   ALKPHOS 71 05/16/2022 1252   ALKPHOS 76 09/14/2013 1127   BILITOT 0.5 05/16/2022 1252   BILITOT 0.4 09/14/2013 1127   GFRNONAA >60 05/16/2022 1252   GFRNONAA >60 09/14/2013 1127   GFRAA >60 01/20/2020 1303   GFRAA >60 09/14/2013 1127    No results found for: "SPEP", "UPEP"  Lab Results  Component Value Date   WBC 8.0 05/16/2022   NEUTROABS 5.9 05/16/2022   HGB 12.5 05/16/2022   HCT 38.9 05/16/2022   MCV 91.7 05/16/2022   PLT 215 05/16/2022      Chemistry      Component Value Date/Time   NA 138 05/16/2022 1252   NA 145 (H) 09/08/2019 1359   NA 142 09/14/2013 1127   K 3.9 05/16/2022 1252   K 3.5 09/14/2013 1127   CL 100 05/16/2022 1252   CL 110 (H) 09/14/2013 1127   CO2 27 05/16/2022 1252   CO2 29 09/14/2013 1127   BUN 16 05/16/2022 1252   BUN 15 09/08/2019 1359   BUN 8 09/14/2013 1127   CREATININE 0.87 05/16/2022 1252   CREATININE 0.71 09/14/2013 1127   CREATININE 0.68 04/01/2013 1550      Component Value Date/Time   CALCIUM 8.8 (L) 05/16/2022 1252   CALCIUM 8.6 09/14/2013 1127   ALKPHOS 71 05/16/2022 1252   ALKPHOS 76 09/14/2013 1127   AST 21 05/16/2022 1252   AST 22 09/14/2013 1127   ALT 13 05/16/2022 1252   ALT 19 09/14/2013 1127   BILITOT 0.5 05/16/2022 1252   BILITOT 0.4 09/14/2013 1127       RADIOGRAPHIC STUDIES: I have personally reviewed the radiological images as listed and agreed with the findings in the report. No results found.   ASSESSMENT & PLAN:  No problem-specific Assessment & Plan notes found for this encounter.        No orders of the defined types were placed in this encounter.    All questions were answered. The patient knows to call the clinic with any problems, questions or concerns.       Cammie Sickle, MD 05/16/2022 1:22 PM

## 2022-05-19 ENCOUNTER — Other Ambulatory Visit: Payer: Self-pay

## 2022-05-19 ENCOUNTER — Other Ambulatory Visit (HOSPITAL_COMMUNITY): Payer: Self-pay

## 2022-05-19 ENCOUNTER — Other Ambulatory Visit: Payer: Self-pay | Admitting: Pharmacist

## 2022-05-19 ENCOUNTER — Encounter: Payer: Self-pay | Admitting: Internal Medicine

## 2022-05-19 DIAGNOSIS — C8311 Mantle cell lymphoma, lymph nodes of head, face, and neck: Secondary | ICD-10-CM

## 2022-05-19 LAB — IMMUNOGLOBULINS A/E/G/M, SERUM
IgA: 83 mg/dL (ref 64–422)
IgE (Immunoglobulin E), Serum: <2 IU/mL — ABNORMAL LOW (ref 6–495)
IgG (Immunoglobin G), Serum: 898 mg/dL (ref 586–1602)
IgM (Immunoglobulin M), Srm: 10 mg/dL — ABNORMAL LOW (ref 26–217)

## 2022-05-19 MED ORDER — CALQUENCE 100 MG PO TABS
100.0000 mg | ORAL_TABLET | Freq: Two times a day (BID) | ORAL | 2 refills | Status: DC
  Filled 2022-05-19: qty 60, 30d supply, fill #0
  Filled 2022-06-19: qty 60, 30d supply, fill #1
  Filled 2022-07-09: qty 60, 30d supply, fill #2

## 2022-05-20 ENCOUNTER — Other Ambulatory Visit: Payer: Self-pay | Admitting: Pharmacist

## 2022-05-21 ENCOUNTER — Ambulatory Visit: Payer: Medicare Other

## 2022-05-21 ENCOUNTER — Other Ambulatory Visit: Payer: Self-pay

## 2022-05-23 DIAGNOSIS — G4733 Obstructive sleep apnea (adult) (pediatric): Secondary | ICD-10-CM | POA: Diagnosis not present

## 2022-05-23 DIAGNOSIS — J9601 Acute respiratory failure with hypoxia: Secondary | ICD-10-CM | POA: Diagnosis not present

## 2022-05-23 DIAGNOSIS — I2699 Other pulmonary embolism without acute cor pulmonale: Secondary | ICD-10-CM | POA: Diagnosis not present

## 2022-06-04 ENCOUNTER — Ambulatory Visit: Payer: Medicare Other | Attending: Internal Medicine

## 2022-06-04 DIAGNOSIS — I5022 Chronic systolic (congestive) heart failure: Secondary | ICD-10-CM | POA: Diagnosis not present

## 2022-06-04 DIAGNOSIS — R0602 Shortness of breath: Secondary | ICD-10-CM

## 2022-06-04 LAB — ECHOCARDIOGRAM COMPLETE
AR max vel: 1.5 cm2
AV Area VTI: 1.33 cm2
AV Area mean vel: 1.46 cm2
AV Mean grad: 5 mmHg
AV Peak grad: 7.5 mmHg
Ao pk vel: 1.37 m/s
Area-P 1/2: 3.58 cm2
S' Lateral: 3.6 cm

## 2022-06-05 ENCOUNTER — Other Ambulatory Visit: Payer: Self-pay

## 2022-06-05 MED ORDER — LOSARTAN POTASSIUM 25 MG PO TABS: 12.5000 mg | ORAL_TABLET | Freq: Every day | ORAL | 3 refills | Status: DC

## 2022-06-09 DIAGNOSIS — G4733 Obstructive sleep apnea (adult) (pediatric): Secondary | ICD-10-CM | POA: Diagnosis not present

## 2022-06-09 DIAGNOSIS — I2699 Other pulmonary embolism without acute cor pulmonale: Secondary | ICD-10-CM | POA: Diagnosis not present

## 2022-06-09 DIAGNOSIS — J9601 Acute respiratory failure with hypoxia: Secondary | ICD-10-CM | POA: Diagnosis not present

## 2022-06-11 NOTE — Progress Notes (Signed)
Cardiology Office Note    Date:  06/13/2022   ID:  Michelle [name], Michelle [name] 1940/12/21, MRN [id number]  PCP:  Burnard Hawthorne, FNP  Cardiologist:  Nelva Bush, MD  Electrophysiologist:  Virl Axe, MD   Chief Complaint: Follow-up  History of Present Illness:   Michelle [name] is a 82 y.o. female with history of HFrEF secondary to NICM, pulmonary fibrosis with pulmonary hypertension, rheumatoid arthritis, mantle cell lymphoma, HLD, LBBB, obesity, PE in 09/2021, and GERD who presents for follow-up of echo.   Remote LHC from 08/2004 showed no significant CAD with an LVEF of 60%.  Prior echo from 12/2018 showed an EF of 50 to 55%, mild LVH, grade 1 diastolic dysfunction, normal RV systolic function and ventricular cavity size, and mild biatrial enlargement.  Echo in 08/2019 showed a new cardiomyopathy with an LVEF of 30 to 35%, global hypokinesis with severe hypokinesis of the anterior and anteroseptal walls, normal RV systolic function and ventricular cavity size, mild pulmonary hypertension, mild mitral regurgitation, moderate tricuspid regurgitation.  Diagnostic R/LHC in 09/2019 showed no angiographically significant CAD with normal left heart filling pressures.  Upper normal to mildly elevated right heart and pulmonary artery pressures.  Normal cardiac output/index.  She underwent follow-up limited echo on maximally tolerated GDMT on 12/26/2019, which showed an EF of 35 to 40%, severe HK of the anteroseptal and anterior wall, normal RV systolic function and RV cavity size, and no significant valvular abnormality.  She was referred to EP for consideration of CRT with underlying LBBB with persistent cardiomyopathy on maximally tolerated GDMT, though was not interested in pursuing CRT.  Echo from 11/2020 demonstrated an EF of 40 to 45%, global hypokinesis, grade 1 diastolic dysfunction, normal RV systolic function and ventricular cavity size, mild TR, and mildly elevated PASP.  Echo in 08/2021  reported as an EF of 60 to 65%, no regional wall motion abnormalities, moderate concentric LVH, grade 1 diastolic dysfunction, normal RV systolic function with moderately large ventricular cavity size, moderate biatrial enlargement, trivial mitral regurgitation, moderate tricuspid regurgitation, aortic sclerosis without evidence of stenosis, right atrial pressure 3 mmHg, and negative bubble study.  However, it was felt this EF may have been an over estimation.  She was seen in the office in 12/2021 with continued exertional dyspnea following her hospitalization in the spring for PE, multifocal pneumonia, and COVID-19.  High-sensitivity troponin normal during that admission.  She remained on supplemental oxygen with activity.  She was last seen in the office in 05/2022 continuing to note shortness of breath that was a little bit worse when compared to prior visits.  She reported considerable exertional dyspnea with ambulation as well as 10 feet.  She was asymptomatic at rest.  She had chronic dependent edema in her left leg which was unchanged from baseline.  She also reported a sensation of things spinning around her and had ran out of her apixaban several days prior.  BNP's have been normal dating back over the past year.  Echo on 06/04/2022 showed an EF of 45 to 50%, global hypokinesis, grade 1 diastolic dysfunction, normal RV systolic function and ventricular cavity size, mild mitral regurgitation, mild dilatation of the ascending aorta measuring 41 mm, and an estimated right atrial pressure of 3 mmHg.  She comes in doing very well from a cardiac perspective and is without symptoms of angina or decompensation.  She notes a significant improvement in her dyspnea since her last visit.  No palpitations, dizziness, presyncope, or syncope.  No significant lower extremity swelling or progressive orthopnea.  She is monitoring her salt and fluid intake.  She continues to only need supplemental oxygen as needed.  She  has tolerated the addition of losartan without issues.  Overall, she is very pleased with her progress and does not have any acute cardiac concerns at this time.   Labs independently reviewed: 05/2022 - Hgb 12.5, PLT 215, potassium 3.9, BUN 16, serum creatinine 0.87, albumin 3.6, AST/ALT normal, BNP 47 09/2021 - magnesium 2.3 09/2020 - TC 151, TG 44, HDL 64, LDL 77   Past Medical History:  Diagnosis Date   Arthritis    Collagen vascular disease (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   Multifocal pneumonia 10/05/2021   Personal history of chemotherapy    Pulmonary embolism ([place]) 10/05/2021   acute   Respiratory failure ([place]) 10/05/2021   acute   Rheumatoid arthritis ([place])     Past Surgical History:  Procedure Laterality Date   BREAST BIOPSY Left 11/07/2020   u/s bx-hydromark #3 "coil"-path pending   CARDIAC CATHETERIZATION  09/2004   [facility]; EF 60%   CARDIAC CATHETERIZATION  08/2004   [facility]   IR FLUORO GUIDED NEEDLE PLC ASPIRATION/INJECTION LOC  06/18/2018   PERIPHERAL VASCULAR CATHETERIZATION N/A 07/04/2015   Procedure: [name] Insertion;  Surgeon: [name], MD;  Location: [place] [facility];  Service: Cardiovascular;  Laterality: N/A;   PORTA CATH REMOVAL N/A 06/23/2018   Procedure: PORTA CATH REMOVAL;  Surgeon: [name], MD;  Location: [place] [facility];  Service: Cardiovascular;  Laterality: N/A;   RIGHT/LEFT HEART CATH AND CORONARY ANGIOGRAPHY Bilateral 09/20/2019   Procedure: RIGHT/LEFT HEART CATH AND CORONARY ANGIOGRAPHY;  Surgeon: Nelva Bush, MD;  Location: [facility] [facility];  Service: Cardiovascular;  Laterality: Bilateral;    Current Medications: Current Meds  Medication Sig   acalabrutinib maleate (CALQUENCE) 100 MG tablet Take 1 tablet (100 mg total) by mouth 2 (two) times daily.   acetaminophen (TYLENOL) 500 MG tablet Take 1,000 mg by mouth every 6 (six) hours as needed for mild pain.    albuterol (VENTOLIN HFA) 108 (90 Base) MCG/ACT inhaler 2 puffs inhaled orally every 4 to 6 hours as needed, not to exceed 12 puffs in 24 hours   apixaban (ELIQUIS) 5 MG TABS tablet Take 1 tablet (5 mg total) by mouth 2 (two) times daily.   carvedilol (COREG) 3.125 MG tablet TAKE 1 TABLET BY MOUTH TWICE A DAY   fluticasone (FLONASE) 50 MCG/ACT nasal spray PLACE 2 SPRAYS INTO BOTH NOSTRILS DAILY AS NEEDED FOR ALLERGIES.   furosemide (LASIX) 40 MG tablet TAKE 1 TABLET BY MOUTH EVERY DAY   ipratropium-albuterol (DUONEB) 0.5-2.5 (3) MG/3ML SOLN Take 3 mLs by nebulization every 6 (six) hours as needed.   losartan (COZAAR) 25 MG tablet Take 0.5 tablets (12.5 mg total) by mouth daily.   MEGARED OMEGA-3 KRILL OIL PO Take 1 tablet by mouth daily.   Multiple Vitamins-Minerals (CENTRUM SILVER 50+WOMEN) TABS Take 1 tablet by mouth daily.   ondansetron (ZOFRAN)  8 MG tablet ONE PILL EVERY 8 HOURS AS NEEDED FOR NAUSEA/VOMITTING.   potassium chloride (KLOR-CON M) 10 MEQ tablet Take 1 tablet (10 mEq total) by mouth daily.   rosuvastatin (CRESTOR) 5 MG tablet TAKE 1 TABLET (5 MG TOTAL) BY MOUTH DAILY.    Allergies:   Patient has no known allergies.   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   Social Determinants of Health   Financial Resource Strain: Low Risk  (02/20/2022)   Overall Financial Resource Strain (CARDIA)    Difficulty of Paying Living Expenses: Not very hard  Food Insecurity: No Food Insecurity (02/20/2022)   Hunger Vital Sign    Worried About Running Out of Food in the Last Year: Never true    Ran Out of Food in the Last Year: Never true  Transportation Needs: No Transportation Needs (02/20/2022)   PRAPARE - Hydrologist (Medical): No    Lack of Transportation (Non-Medical): No  Physical Activity: Insufficiently Active (02/20/2022)   Exercise Vital Sign    Days of Exercise per Week: 3 days    Minutes of Exercise per Session: 30 min  Stress: No Stress Concern Present (02/20/2022)   [place]    Feeling of Stress : Only a little  Social Connections: Socially Integrated (02/20/2022)   Social Connection and Isolation Panel [NHANES]    Frequency of Communication with Friends and Family: More than three times a week    Frequency of Social Gatherings with Friends and Family: More than three times a week    Attends Religious Services: More than 4 times per year    Active Member of [employer] or Organizations: Yes    Attends Music therapist: More than 4 times per year    Marital Status: Married     Family History:  The patient's family history includes Arthritis in her brother; Cholelithiasis in her mother; Diabetes in her mother and sister; Heart murmur in her sister; Hypertension in her sister. There is no history of Breast cancer.  ROS:   12-point review of systems is negative unless otherwise noted in the HPI.   EKGs/Labs/Other Studies Reviewed:    Studies reviewed were summarized above. The additional  studies were reviewed today:  2D echo 08/2019: 1. Left ventricular ejection fraction, by estimation, is 30 to 35%. The  left ventricle has moderately decreased function. The left ventricle  demonstrates global hypokinesis with severe hypokinesis of teh anterior,  anteroseptal wall.   2. Right ventricular systolic function is normal. The right ventricular  size is normal. There is moderately elevated pulmonary artery systolic  pressure. The estimated right ventricular systolic pressure is 67.6 mmHg.   3. Mild mitral valve regurgitation.   4. Tricuspid valve regurgitation is  moderate.   5. The inferior vena cava is normal in size with greater than 50%  respiratory variability, suggesting right atrial pressure of 3 mmHg.   Comparison(s): EF 50-55%. __________   [facility] 09/2019: Conclusions: No angiographically significant coronary artery disease. Normal left heart filling pressures. Upper normal to mildly elevated right heart and pulmonary artery pressures. Normal Fick cardiac output/index.   Recommendations: Optimize goal-directed medical therapy for management of nonischemic cardiomyopathy.  We will increase losartan to 25 mg daily.  BMP should be repeated in approximately 2 weeks to ensure stable renal function and potassium. Primary prevention of coronary artery disease. __________   Limited 2D echo 12/2019: 1. Left ventricular ejection fraction, by estimation, is 35 to 40%. The  left ventricle has moderately decreased function. The left ventricle has  no regional wall motion abnormalities. There is severe hypokinesis of the  left ventricular, anteroseptal  wall and anterior wall.   2. Right ventricular systolic function is normal. The right ventricular  size is normal. There is normal pulmonary artery systolic pressure.   3. The mitral valve is normal in structure. No evidence of mitral valve  regurgitation. No evidence of mitral stenosis.   4. The aortic valve is normal in  structure. Aortic valve regurgitation is  not visualized. No aortic stenosis is present. __________   2D echo 11/2020: 1. Left ventricular ejection fraction, by estimation, is 40 to 45%. The  left ventricle has mildly decreased function. The left ventricle  demonstrates global hypokinesis. Left ventricular diastolic parameters are  consistent with Grade I diastolic  dysfunction (impaired relaxation). The average left ventricular global  longitudinal strain is -18.6 %.   2. Right ventricular systolic function is normal. The right ventricular  size is normal. There is mildly elevated pulmonary artery systolic  pressure. The estimated right ventricular systolic pressure is 75.1 mmHg.  __________  2D echo 08/31/2021: 1. Left ventricular ejection fraction, by estimation, is 60 to 65%. The  left ventricle has normal function. The left ventricle has no regional  wall motion abnormalities. There is moderate concentric left ventricular  hypertrophy. Left ventricular  diastolic parameters are consistent with Grade I diastolic dysfunction  (impaired relaxation).   2. Right ventricular systolic function is normal. The right ventricular  size is moderately enlarged.   3. Left atrial size was moderately dilated.   4. Right atrial size was moderately dilated.   5. The mitral valve is normal in structure. Trivial mitral valve  regurgitation. No evidence of mitral stenosis.   6. Tricuspid valve regurgitation is moderate.   7. The aortic valve is normal in structure. Aortic valve regurgitation is  not visualized. Aortic valve sclerosis is present, with no evidence of  aortic valve stenosis.   8. The inferior vena cava is normal in size with greater than 50%  respiratory variability, suggesting right atrial pressure of 3 mmHg.   9. Agitated saline contrast bubble study was negative, with no evidence  of any interatrial shunt.  __________  2D echo 06/04/2022: 1. Left ventricular ejection fraction, by  estimation, is 45 to 50%. The  left ventricle has mildly decreased function. The left ventricle  demonstrates global hypokinesis. Left ventricular diastolic parameters are  consistent with Grade I diastolic  dysfunction (impaired relaxation).   2. Right ventricular systolic function is normal. The right ventricular  size is normal.   3. The mitral valve is normal in structure. Mild mitral valve  regurgitation.   4. The aortic valve is tricuspid. Aortic valve regurgitation  is not  visualized.   5. Aortic dilatation noted. There is mild dilatation of the ascending  aorta, measuring 41 mm.   6. The inferior vena cava is normal in size with greater than 50%  respiratory variability, suggesting right atrial pressure of 3 mmHg.   Comparison(s): 08/31/21-EF 60-65%.    EKG:  EKG is not ordered today.    Recent Labs: 10/02/2021: Pro B Natriuretic peptide (BNP) 50.0 10/04/2021: Magnesium 2.3 05/16/2022: ALT 13; B Natriuretic Peptide 47.4; BUN 16; Creatinine, Ser 0.87; Hemoglobin 12.5; Platelets 215; Potassium 3.9; Sodium 138  Recent Lipid Panel    Component Value Date/Time   CHOL 151 10/12/2020 1119   TRIG 44.0 10/12/2020 1119   HDL 64.70 10/12/2020 1119   CHOLHDL 2 10/12/2020 1119   VLDL 8.8 10/12/2020 1119   LDLCALC 77 10/12/2020 1119    PHYSICAL EXAM:    VS:  BP 97/63 (BP Location: Left Arm, Patient Position: Sitting, Cuff Size: Normal)   Pulse 65   Ht '5\' 7"'$  (1.702 m)   Wt 237 lb 3.2 oz (107.6 kg)   SpO2 96%   BMI 37.15 kg/m   BMI: Body mass index is 37.15 kg/m.  Physical Exam Vitals reviewed.  Constitutional:      Appearance: She is well-developed.  HENT:     Head: Normocephalic and atraumatic.  Eyes:     General:        Right eye: No discharge.        Left eye: No discharge.  Neck:     Vascular: No JVD.  Cardiovascular:     Rate and Rhythm: Normal rate and regular rhythm.     Heart sounds: Normal heart sounds, S1 normal and S2 normal. Heart sounds not distant. No  midsystolic click and no opening snap. No murmur heard.    No friction rub.  Pulmonary:     Effort: Pulmonary effort is normal. No respiratory distress.     Breath sounds: Normal breath sounds. No decreased breath sounds, wheezing or rales.  Chest:     Chest wall: No tenderness.  Abdominal:     General: There is no distension.  Musculoskeletal:     Cervical back: Normal range of motion.     Right lower leg: No edema.     Left lower leg: No edema.  Skin:    General: Skin is warm and dry.     Nails: There is no clubbing.  Neurological:     Mental Status: She is alert and oriented to person, place, and time.  Psychiatric:        Speech: Speech normal.        Behavior: Behavior normal.        Thought Content: Thought content normal.        Judgment: Judgment normal.     Wt Readings from Last 3 Encounters:  06/13/22 237 lb 3.2 oz (107.6 kg)  05/16/22 240 lb 6.4 oz (109 kg)  05/07/22 241 lb (109.3 kg)     ASSESSMENT & PLAN:   HFrEF secondary to NICM: Overall, she is doing very well and is without symptoms of angina or decompensation.  Recent echo showed a stable mild cardiomyopathy.  She remains on carvedilol, losartan, and furosemide with KCl.  Relative hypotension (asymptomatic) precludes further escalation of GDMT at this time.  Check BMP following initiation of ARB.  History of PE: Followed by hematology/oncology who recommends continuing anticoagulation for 12 months following date of diagnosis (08/2022).  Fibrotic lung disease with pulmonary  hypertension and chronic exertional dyspnea: Symptoms improved when compared to last visit.  Followed by pulmonology.  HLD: LDL 77 she remains on rosuvastatin.  Mantle cell lymphoma: Followed by hematology/oncology.   Disposition: F/u with Dr. Saunders Revel or an APP in 4 months, and EP as directed.   Medication Adjustments/Labs and Tests Ordered: Current medicines are reviewed at length with the patient today.  Concerns regarding medicines  are outlined above. Medication changes, Labs and Tests ordered today are summarized above and listed in the Patient Instructions accessible in Encounters.   Signed, Christell Faith, PA-C 06/13/2022 1:45 PM     [address] [place], [place] [address] [phone]

## 2022-06-12 ENCOUNTER — Encounter: Payer: Self-pay | Admitting: Internal Medicine

## 2022-06-13 ENCOUNTER — Inpatient Hospital Stay: Payer: 59 | Admitting: Internal Medicine

## 2022-06-13 ENCOUNTER — Encounter: Payer: Self-pay | Admitting: Physician Assistant

## 2022-06-13 ENCOUNTER — Inpatient Hospital Stay: Payer: 59

## 2022-06-13 ENCOUNTER — Ambulatory Visit: Payer: 59 | Attending: Physician Assistant | Admitting: Physician Assistant

## 2022-06-13 ENCOUNTER — Other Ambulatory Visit
Admission: RE | Admit: 2022-06-13 | Discharge: 2022-06-13 | Disposition: A | Discharge status: Home / Self Care | Payer: 59 | Source: Ambulatory Visit | Attending: Physician Assistant | Admitting: Physician Assistant

## 2022-06-13 VITALS — BP 97/63 | HR 65 | Ht 67.0 in | Wt 237.2 lb

## 2022-06-13 DIAGNOSIS — E785 Hyperlipidemia, unspecified: Secondary | ICD-10-CM

## 2022-06-13 DIAGNOSIS — Z86711 Personal history of pulmonary embolism: Secondary | ICD-10-CM

## 2022-06-13 DIAGNOSIS — I428 Other cardiomyopathies: Secondary | ICD-10-CM | POA: Diagnosis not present

## 2022-06-13 DIAGNOSIS — I5022 Chronic systolic (congestive) heart failure: Secondary | ICD-10-CM

## 2022-06-13 DIAGNOSIS — J8410 Pulmonary fibrosis, unspecified: Secondary | ICD-10-CM

## 2022-06-13 DIAGNOSIS — C8311 Mantle cell lymphoma, lymph nodes of head, face, and neck: Secondary | ICD-10-CM

## 2022-06-13 LAB — BASIC METABOLIC PANEL
Anion gap: 9 (ref 5–15)
BUN: 16 mg/dL (ref 8–23)
CO2: 27 mmol/L (ref 22–32)
Calcium: 9.2 mg/dL (ref 8.9–10.3)
Chloride: 102 mmol/L (ref 98–111)
Creatinine, Ser: 0.73 mg/dL (ref 0.44–1.00)
GFR, Estimated: >60 mL/min (ref >60)
Glucose, Bld: 103 mg/dL — ABNORMAL HIGH (ref 70–99)
Potassium: 4 mmol/L (ref 3.5–5.1)
Sodium: 138 mmol/L (ref 135–145)

## 2022-06-13 NOTE — Patient Instructions (Signed)
Medication Instructions:  No changes at this time.   *If you need a refill on your cardiac medications before your next appointment, please call your pharmacy*   Lab Work: BMET today over at the [facility]. Please stop at registration desk to get checked in.   If you have labs (blood work) drawn today and your tests are completely normal, you will receive your results only by: [place] (if you have MyChart) OR A paper copy in the mail If you have any lab test that is abnormal or we need to change your treatment, we will call you to review the results.   Testing/Procedures: None   Follow-Up: At [facility], you and your health needs are our priority.  As part of our continuing mission to provide you with exceptional heart care, we have created designated Provider Care Teams.  These Care Teams include your primary Cardiologist (physician) and Advanced Practice Providers (APPs -  Physician Assistants and Nurse Practitioners) who all work together to provide you with the care you need, when you need it.   Your next appointment:   4 month(s)  Provider:   Nelva Bush, MD or Christell Faith, PA-C

## 2022-06-17 ENCOUNTER — Other Ambulatory Visit (HOSPITAL_COMMUNITY): Payer: Self-pay

## 2022-06-18 ENCOUNTER — Other Ambulatory Visit: Payer: Self-pay | Admitting: Internal Medicine

## 2022-06-19 ENCOUNTER — Inpatient Hospital Stay: Payer: 59 | Attending: Internal Medicine

## 2022-06-19 ENCOUNTER — Other Ambulatory Visit (HOSPITAL_COMMUNITY): Payer: Self-pay

## 2022-06-19 ENCOUNTER — Encounter: Payer: Self-pay | Admitting: Internal Medicine

## 2022-06-19 ENCOUNTER — Inpatient Hospital Stay: Payer: 59 | Admitting: Internal Medicine

## 2022-06-19 VITALS — BP 108/68 | HR 71 | Temp 96.2°F | Resp 16 | Wt 239.8 lb

## 2022-06-19 DIAGNOSIS — I255 Ischemic cardiomyopathy: Secondary | ICD-10-CM | POA: Diagnosis not present

## 2022-06-19 DIAGNOSIS — J9610 Chronic respiratory failure, unspecified whether with hypoxia or hypercapnia: Secondary | ICD-10-CM | POA: Diagnosis not present

## 2022-06-19 DIAGNOSIS — C8311 Mantle cell lymphoma, lymph nodes of head, face, and neck: Secondary | ICD-10-CM | POA: Diagnosis not present

## 2022-06-19 DIAGNOSIS — Z9981 Dependence on supplemental oxygen: Secondary | ICD-10-CM | POA: Diagnosis not present

## 2022-06-19 DIAGNOSIS — Z993 Dependence on wheelchair: Secondary | ICD-10-CM | POA: Insufficient documentation

## 2022-06-19 DIAGNOSIS — M069 Rheumatoid arthritis, unspecified: Secondary | ICD-10-CM | POA: Diagnosis not present

## 2022-06-19 DIAGNOSIS — Z7901 Long term (current) use of anticoagulants: Secondary | ICD-10-CM | POA: Insufficient documentation

## 2022-06-19 DIAGNOSIS — Z86711 Personal history of pulmonary embolism: Secondary | ICD-10-CM | POA: Insufficient documentation

## 2022-06-19 DIAGNOSIS — I428 Other cardiomyopathies: Secondary | ICD-10-CM | POA: Diagnosis not present

## 2022-06-19 DIAGNOSIS — I272 Pulmonary hypertension, unspecified: Secondary | ICD-10-CM | POA: Diagnosis not present

## 2022-06-19 DIAGNOSIS — Z79899 Other long term (current) drug therapy: Secondary | ICD-10-CM | POA: Insufficient documentation

## 2022-06-19 LAB — COMPREHENSIVE METABOLIC PANEL
ALT: 13 U/L (ref 0–44)
AST: 22 U/L (ref 15–41)
Albumin: 3.7 g/dL (ref 3.5–5.0)
Alkaline Phosphatase: 72 U/L (ref 38–126)
Anion gap: 9 (ref 5–15)
BUN: 16 mg/dL (ref 8–23)
CO2: 27 mmol/L (ref 22–32)
Calcium: 8.8 mg/dL — ABNORMAL LOW (ref 8.9–10.3)
Chloride: 101 mmol/L (ref 98–111)
Creatinine, Ser: 0.76 mg/dL (ref 0.44–1.00)
GFR, Estimated: >60 mL/min (ref >60)
Glucose, Bld: 102 mg/dL — ABNORMAL HIGH (ref 70–99)
Potassium: 4.1 mmol/L (ref 3.5–5.1)
Sodium: 137 mmol/L (ref 135–145)
Total Bilirubin: 0.4 mg/dL (ref 0.3–1.2)
Total Protein: 6.8 g/dL (ref 6.5–8.1)

## 2022-06-19 LAB — CBC WITH DIFFERENTIAL/PLATELET
Abs Immature Granulocytes: 0.04 10*3/uL (ref 0.00–0.07)
Basophils Absolute: 0 10*3/uL (ref 0.0–0.1)
Basophils Relative: 0 %
Eosinophils Absolute: 0.1 10*3/uL (ref 0.0–0.5)
Eosinophils Relative: 1 %
HCT: 40.5 % (ref 36.0–46.0)
Hemoglobin: 12.9 g/dL (ref 12.0–15.0)
Immature Granulocytes: 1 %
Lymphocytes Relative: 19 %
Lymphs Abs: 1.4 10*3/uL (ref 0.7–4.0)
MCH: 29.8 pg (ref 26.0–34.0)
MCHC: 31.9 g/dL (ref 30.0–36.0)
MCV: 93.5 fL (ref 80.0–100.0)
Monocytes Absolute: 0.4 10*3/uL (ref 0.1–1.0)
Monocytes Relative: 5 %
Neutro Abs: 5.4 10*3/uL (ref 1.7–7.7)
Neutrophils Relative %: 74 %
Platelets: 197 10*3/uL (ref 150–400)
RBC: 4.33 MIL/uL (ref 3.87–5.11)
RDW: 14.8 % (ref 11.5–15.5)
WBC: 7.2 10*3/uL (ref 4.0–10.5)
nRBC: 0 % (ref 0.0–0.2)

## 2022-06-19 LAB — LACTATE DEHYDROGENASE: LDH: 150 U/L (ref 98–192)

## 2022-06-19 LAB — BRAIN NATRIURETIC PEPTIDE: B Natriuretic Peptide: 53.3 pg/mL (ref 0.0–100.0)

## 2022-06-19 NOTE — Progress Notes (Signed)
[name] OFFICE PROGRESS NOTE  Patient Care Team: Burnard Hawthorne, FNP as PCP - General (Family Medicine) End, Harrell Gave, MD as PCP - Cardiology (Cardiology) Deboraha Sprang, MD as PCP - Electrophysiology (Cardiology) Jackolyn Confer, MD (Internal Medicine) Cammie Sickle, MD as Consulting Physician (Internal Medicine)   Cancer Staging  No matching staging information was found for the patient.   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 Feb 9th- START Benda-Ritux with neulasta; Prolonged neutropenia; DISCONT- Benda-Ritux;   # April 13 th 2017- START R-CHOP x1; severe/prolonged neutropenia; PET- CR; BMBx-Neg; Disc R-CHOP # June 2022- DIAGNOSIS: thickened cortex of 12 mm. An additional lymph node demonstrates a thickened cortex of 7 mm. A third lymph node demonstrates a mildly thickened cortex of 4 mm A. LYMPH NODE, LEFT AXILLA; ULTRASOUND-GUIDED BIOPSY:  - CD5+ MONOCLONAL B-CELL POPULATION; COMPATIBLE WITH INVOLVEMENT BY THE  PATIENT'S KNOWN MANTLE CELL LYMPHOMA.   #July 2022-second week-started ibrutinib 420 mg a dayx 1 week-stop because of severe rash.  Significant clinical response noted.  # # 26th MAY 2017- Start Rituxan q 34M Main OCT 12th 2017- PET NED.    # AUG 1st, 2022- start acalbrutinib. 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   Michelle [name] 82 y.o.  female pleasant patient multiple medical problems-ischemic cardiomyopathy; pulm hypertension; Hx of PE on eliquis and above history of recurrent mantle cell lymphoma on Calquence; here for follow-up.  Patient denies new problems/concerns today. Currently walking with walker. Patient admits compliance to Calquence.  Denies any headaches.Patient continues to be on 2 L of oxygen but as needed.  No further hospitalizations.  No nausea no vomiting.  No diarrhea.  No chest pain no new lumps or bumps.  Review of Systems  Constitutional:  Negative for chills, diaphoresis, fever, malaise/fatigue and weight loss.  HENT:  Negative for nosebleeds and sore throat.   Eyes:  Negative for double vision.  Respiratory:  Negative for hemoptysis, sputum production and wheezing.   Cardiovascular:  Negative for chest pain, palpitations, orthopnea and leg swelling.  Gastrointestinal:  Positive for constipation. Negative for abdominal pain, blood in stool, diarrhea, heartburn, melena, nausea and vomiting.  Genitourinary:  Negative for dysuria, frequency and urgency.  Musculoskeletal:  Positive for back pain and joint pain.  Skin: Negative.  Negative for itching and rash.  Neurological:  Negative for dizziness, tingling, focal weakness, weakness and headaches.  Endo/Heme/Allergies:  Does not bruise/bleed easily.  Psychiatric/Behavioral:  Negative for depression. The patient is not nervous/anxious and does not have insomnia.     PAST MEDICAL HISTORY :  Past Medical History:  Diagnosis Date   Arthritis    Collagen vascular disease ([place])    GERD (gastroesophageal reflux disease)    Headache(784.0)    HFrEF (heart failure with reduced ejection fraction) (HCC)    Nonischemic cardiomyopathy, LVEF as low as  30-35% in 08/2019   History of methotrexate therapy    Hyperlipidemia    hx   Lymphadenopathy of head and neck 01/2015   see on Thyroid ultrasound   Lymphoma, mantle  cell ([place]) 06/01/2015   bx of lymph node in right breast/Stage IV Mantle Cell Lymphoma   Multifocal pneumonia 10/05/2021   Personal history of chemotherapy    Pulmonary embolism ([place]) 10/05/2021   acute   Respiratory failure ([place]) 10/05/2021   acute   Rheumatoid arthritis ([place])     PAST SURGICAL HISTORY :   Past Surgical History:  Procedure Laterality Date   BREAST BIOPSY Left 11/07/2020   u/s bx-hydromark #3 "coil"-path pending   CARDIAC CATHETERIZATION  09/2004   [facility]; EF 60%   CARDIAC CATHETERIZATION  08/2004   [facility]   IR FLUORO GUIDED NEEDLE PLC ASPIRATION/INJECTION LOC  06/18/2018   PERIPHERAL VASCULAR CATHETERIZATION N/A 07/04/2015   Procedure: [name] Insertion;  Surgeon: [name], MD;  Location: [facility];  Service: Cardiovascular;  Laterality: N/A;   PORTA CATH REMOVAL N/A 06/23/2018   Procedure: PORTA CATH REMOVAL;  Surgeon: [name], MD;  Location: [facility];  Service: Cardiovascular;  Laterality: N/A;   RIGHT/LEFT HEART CATH AND CORONARY ANGIOGRAPHY Bilateral 09/20/2019   Procedure: RIGHT/LEFT HEART CATH AND CORONARY ANGIOGRAPHY;  Surgeon: Nelva Bush, MD;  Location: [facility];  Service: Cardiovascular;  Laterality: Bilateral;    FAMILY HISTORY :   Family History  Problem Relation Age of Onset   Diabetes Mother    Cholelithiasis Mother    Hypertension Sister    Diabetes Sister    Heart murmur Sister    Arthritis Brother    Breast cancer Neg Hx     SOCIAL HISTORY:   Social History   Tobacco Use   Smoking status: Never   Smokeless tobacco: Never  Vaping Use   Vaping Use: Never used  Substance Use Topics   Alcohol use: No   Drug use: No    ALLERGIES:  has No Known Allergies.  MEDICATIONS:  Current Outpatient Medications  Medication Sig Dispense Refill   acalabrutinib maleate (CALQUENCE) 100 MG tablet Take 1 tablet (100 mg total) by mouth 2 (two) times daily. 60 tablet 2   acetaminophen (TYLENOL) 500 MG tablet  Take 1,000 mg by mouth every 6 (six) hours as needed for mild pain.      albuterol (VENTOLIN HFA) 108 (90 Base) MCG/ACT inhaler 2 puffs inhaled orally every 4 to 6 hours as needed, not to exceed 12 puffs in 24 hours 18 each 1   apixaban (ELIQUIS) 5 MG TABS tablet Take 1 tablet (5 mg total) by mouth 2 (two) times daily. 90 tablet 3   carvedilol (COREG) 3.125 MG tablet TAKE 1 TABLET BY MOUTH TWICE A DAY 180 tablet 1   fluticasone (FLONASE) 50 MCG/ACT nasal spray PLACE 2 SPRAYS INTO BOTH NOSTRILS DAILY AS NEEDED FOR ALLERGIES. 48 mL 1   furosemide (LASIX) 40 MG tablet TAKE 1 TABLET BY MOUTH EVERY DAY 90 tablet 1   ipratropium-albuterol (DUONEB) 0.5-2.5 (3) MG/3ML SOLN Take 3 mLs by nebulization every 6 (six) hours as needed. 1080 mL 0   losartan (COZAAR) 25 MG tablet Take 0.5 tablets (12.5 mg total) by mouth daily. 45 tablet 3   MEGARED OMEGA-3 KRILL OIL PO Take 1 tablet by mouth daily.     Multiple Vitamins-Minerals (CENTRUM SILVER 50+WOMEN) TABS Take 1 tablet by mouth daily.  ondansetron (ZOFRAN) 8 MG tablet ONE PILL EVERY 8 HOURS AS NEEDED FOR NAUSEA/VOMITTING. 40 tablet 1   potassium chloride (KLOR-CON M) 10 MEQ tablet Take 1 tablet (10 mEq total) by mouth daily. 30 tablet 2   rosuvastatin (CRESTOR) 5 MG tablet TAKE 1 TABLET (5 MG TOTAL) BY MOUTH DAILY. 90 tablet 3   benzonatate (TESSALON) 100 MG capsule Take 1 capsule (100 mg total) by mouth 2 (two) times daily as needed for cough. (Patient not taking: Reported on 06/13/2022) 40 capsule 0   feeding supplement (ENSURE ENLIVE / ENSURE PLUS) LIQD Take 237 mLs by mouth 3 (three) times daily between meals. (Patient not taking: Reported on 06/13/2022) 237 mL 12   No current facility-administered medications for this visit.   Facility-Administered Medications Ordered in Other Visits  Medication Dose Route Frequency Provider Last Rate Last Admin   heparin lock flush 100 unit/mL  500 Units Intravenous Once Charlaine Dalton R, MD       sodium  chloride flush (NS) 0.9 % injection 10 mL  10 mL Intravenous Once Charlaine Dalton R, MD       sodium chloride flush (NS) 0.9 % injection 10 mL  10 mL Intravenous Once Cammie Sickle, MD        PHYSICAL EXAMINATION: ECOG PERFORMANCE STATUS: 0 - Asymptomatic  BP 108/68 (BP Location: Left Arm, Patient Position: Sitting)   Pulse 71   Temp (!) 96.2 F (35.7 C) (Tympanic)   Resp 16   Wt 239 lb 12.8 oz (108.8 kg)   BMI 37.56 kg/m   Filed Weights   06/19/22 1000  Weight: 239 lb 12.8 oz (108.8 kg)    Physical Exam HENT:     Head: Normocephalic and atraumatic.     Mouth/Throat:     Pharynx: No oropharyngeal exudate.  Eyes:     Pupils: Pupils are equal, round, and reactive to light.  Cardiovascular:     Rate and Rhythm: Normal rate and regular rhythm.  Pulmonary:     Effort: No respiratory distress.     Breath sounds: No wheezing.  Abdominal:     General: Bowel sounds are normal. There is no distension.     Palpations: Abdomen is soft. There is no mass.     Tenderness: There is no abdominal tenderness. There is no guarding or rebound.  Musculoskeletal:        General: No tenderness. Normal range of motion.     Cervical back: Normal range of motion and neck supple.  Skin:    General: Skin is warm.  Neurological:     Mental Status: She is alert and oriented to person, place, and time.  Psychiatric:        Mood and Affect: Affect normal.    LABORATORY DATA:  I have reviewed the data as listed    Component Value Date/Time   NA 137 06/19/2022 1019   NA 145 (H) 09/08/2019 1359   NA 142 09/14/2013 1127   K 4.1 06/19/2022 1019   K 3.5 09/14/2013 1127   CL 101 06/19/2022 1019   CL 110 (H) 09/14/2013 1127   CO2 27 06/19/2022 1019   CO2 29 09/14/2013 1127   GLUCOSE 102 (H) 06/19/2022 1019   GLUCOSE 106 (H) 09/14/2013 1127   BUN 16 06/19/2022 1019   BUN 15 09/08/2019 1359   BUN 8 09/14/2013 1127   CREATININE 0.76 06/19/2022 1019   CREATININE 0.71 09/14/2013 1127    CREATININE 0.68 04/01/2013 1550   CALCIUM 8.8 (  L) 06/19/2022 1019   CALCIUM 8.6 09/14/2013 1127   PROT 6.8 06/19/2022 1019   PROT 7.6 09/14/2013 1127   ALBUMIN 3.7 06/19/2022 1019   ALBUMIN 3.2 (L) 09/14/2013 1127   AST 22 06/19/2022 1019   AST 22 09/14/2013 1127   ALT 13 06/19/2022 1019   ALT 19 09/14/2013 1127   ALKPHOS 72 06/19/2022 1019   ALKPHOS 76 09/14/2013 1127   BILITOT 0.4 06/19/2022 1019   BILITOT 0.4 09/14/2013 1127   GFRNONAA >60 06/19/2022 1019   GFRNONAA >60 09/14/2013 1127   GFRAA >60 01/20/2020 1303   GFRAA >60 09/14/2013 1127    No results found for: "SPEP", "UPEP"  Lab Results  Component Value Date   WBC 7.2 06/19/2022   NEUTROABS 5.4 06/19/2022   HGB 12.9 06/19/2022   HCT 40.5 06/19/2022   MCV 93.5 06/19/2022   PLT 197 06/19/2022      Chemistry      Component Value Date/Time   NA 137 06/19/2022 1019   NA 145 (H) 09/08/2019 1359   NA 142 09/14/2013 1127   K 4.1 06/19/2022 1019   K 3.5 09/14/2013 1127   CL 101 06/19/2022 1019   CL 110 (H) 09/14/2013 1127   CO2 27 06/19/2022 1019   CO2 29 09/14/2013 1127   BUN 16 06/19/2022 1019   BUN 15 09/08/2019 1359   BUN 8 09/14/2013 1127   CREATININE 0.76 06/19/2022 1019   CREATININE 0.71 09/14/2013 1127   CREATININE 0.68 04/01/2013 1550      Component Value Date/Time   CALCIUM 8.8 (L) 06/19/2022 1019   CALCIUM 8.6 09/14/2013 1127   ALKPHOS 72 06/19/2022 1019   ALKPHOS 76 09/14/2013 1127   AST 22 06/19/2022 1019   AST 22 09/14/2013 1127   ALT 13 06/19/2022 1019   ALT 19 09/14/2013 1127   BILITOT 0.4 06/19/2022 1019   BILITOT 0.4 09/14/2013 1127       RADIOGRAPHIC STUDIES: I have personally reviewed the radiological images as listed and agreed with the findings in the report. No results found.   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    # Currently on Calequence 100 mg twice a day [ AUG 1st, 2022].  Tolerating well- stable. Will repeat san in March-April 2024.   # March 31st, 2023- right upper lobe pulmonary artery branches  consistent with pulmonary embolism- on eliquis [1 year- until March 2024]- stable.  # Immunocompromised state: Hx Multifocal pneumonia-[April 2023]-Given the multiple pneumonias/high risk of death-recommended continuation of further rituximab. SEP 2023- IgG- 825.  Monitor for now. stable.   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  #Chronic respiratory failure -2 L home O2 -NICMP/pulmonary hypertension/chronic bilateral lung scarring-followed by pulmonary [[place]]-  stable.  # Left shoulder pain/ Chronic arthritis-on meloxicam as needed- stable.  #Vaccinations : s/p flu shot- today; discussed r: COVID booster/RSV; ? Pneumonia vaccination [check with PCP]  fridays pt pref-  # DISPOSITION:  # follow up-MD in 6 weeks//friday [MD ;labs- cbc/cmp/ldh/BNP; quantitative immunoglobulin]- .Dr.B            Orders  Placed This Encounter  Procedures   CBC with Differential/Platelet    Standing Status:   Future    Standing Expiration Date:   06/19/2023   Comprehensive metabolic panel    Standing Status:   Future    Standing Expiration Date:   06/19/2023   Lactate dehydrogenase    Standing Status:   Future    Standing Expiration Date:   06/20/2023   Brain natriuretic peptide    Standing Status:   Future    Standing Expiration Date:   06/20/2023   Immunoglobulins, QN, A/E/G/M    Standing Status:   Future    Standing Expiration Date:   06/20/2023     All questions were answered.  The patient knows to call the clinic with any problems, questions or concerns.      Cammie Sickle, MD 06/20/2022 8:22 AM

## 2022-06-19 NOTE — Progress Notes (Signed)
Patient family reports patient not sleeping well at night, patient disagrees.  When she eats her stomach makes a growling sounds even though she just ate.

## 2022-06-19 NOTE — Assessment & Plan Note (Addendum)
#  Mantle cell lymphoma recurrent biopsy-proven [July 2022]; Currently on Calquence [ DISCONTINUED  Rituximab-given the multifocal pneumonia]-  PET scan OCT 13th, 2023- complete response;  No evidence non-Hodgkin's lymphoma/mantle cell recurrence or progression.    # Currently on Calequence 100 mg twice a day [ AUG 1st, 2022].  Tolerating well- stable. Will repeat san in March-April 2024.   # March 31st, 2023- right upper lobe pulmonary artery branches  consistent with pulmonary embolism- on eliquis [1 year- until March 2024]- stable.  # Immunocompromised state: Hx Multifocal pneumonia-[April 2023]-Given the multiple pneumonias/high risk of death-recommended continuation of further rituximab. SEP 2023- IgG- 825.  Monitor for now. stable.   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  #Chronic respiratory failure -2 L home O2 -NICMP/pulmonary hypertension/chronic bilateral lung scarring-followed by pulmonary [[place]]-  stable.  # Left shoulder pain/ Chronic arthritis-on meloxicam as needed- stable.  #Vaccinations : s/p flu shot- today; discussed r: COVID booster/RSV; ? Pneumonia vaccination [check with PCP]  fridays pt pref-  # DISPOSITION:  # follow up-MD in 6 weeks//friday [MD ;labs- cbc/cmp/ldh/BNP; quantitative immunoglobulin]- .Dr.B

## 2022-06-20 ENCOUNTER — Encounter: Payer: Self-pay | Admitting: Internal Medicine

## 2022-06-21 LAB — IMMUNOGLOBULINS A/E/G/M, SERUM
IgA: 104 mg/dL (ref 64–422)
IgE (Immunoglobulin E), Serum: <2 IU/mL — ABNORMAL LOW (ref 6–495)
IgG (Immunoglobin G), Serum: 879 mg/dL (ref 586–1602)
IgM (Immunoglobulin M), Srm: 10 mg/dL — ABNORMAL LOW (ref 26–217)

## 2022-06-23 DIAGNOSIS — J9601 Acute respiratory failure with hypoxia: Secondary | ICD-10-CM | POA: Diagnosis not present

## 2022-06-23 DIAGNOSIS — I2699 Other pulmonary embolism without acute cor pulmonale: Secondary | ICD-10-CM | POA: Diagnosis not present

## 2022-06-23 DIAGNOSIS — G4733 Obstructive sleep apnea (adult) (pediatric): Secondary | ICD-10-CM | POA: Diagnosis not present

## 2022-07-09 ENCOUNTER — Other Ambulatory Visit (HOSPITAL_COMMUNITY): Payer: Self-pay

## 2022-07-10 DIAGNOSIS — G4733 Obstructive sleep apnea (adult) (pediatric): Secondary | ICD-10-CM | POA: Diagnosis not present

## 2022-07-10 DIAGNOSIS — J9601 Acute respiratory failure with hypoxia: Secondary | ICD-10-CM | POA: Diagnosis not present

## 2022-07-10 DIAGNOSIS — I2699 Other pulmonary embolism without acute cor pulmonale: Secondary | ICD-10-CM | POA: Diagnosis not present

## 2022-07-15 ENCOUNTER — Telehealth: Payer: Self-pay | Admitting: Family

## 2022-07-15 ENCOUNTER — Other Ambulatory Visit (HOSPITAL_COMMUNITY): Payer: Self-pay

## 2022-07-15 NOTE — Telephone Encounter (Signed)
Patient called back and nv made.

## 2022-07-15 NOTE — Telephone Encounter (Signed)
Called pt but was unable to lvm because vm has not been set up yet

## 2022-07-15 NOTE — Telephone Encounter (Signed)
Patient needs to know if it is time for her pneumonia shot, her cancer doctor would like to know.

## 2022-07-17 ENCOUNTER — Other Ambulatory Visit (HOSPITAL_COMMUNITY): Payer: Self-pay

## 2022-07-17 ENCOUNTER — Ambulatory Visit (INDEPENDENT_AMBULATORY_CARE_PROVIDER_SITE_OTHER): Payer: 59

## 2022-07-17 DIAGNOSIS — Z23 Encounter for immunization: Secondary | ICD-10-CM | POA: Diagnosis not present

## 2022-07-17 NOTE — Progress Notes (Signed)
Pt presented today for Prevnar 20 vaccine. Pt received in right deltoid, IM. Pt voiced no concerns nor showed any signs of distress during in injection.

## 2022-07-24 DIAGNOSIS — J9601 Acute respiratory failure with hypoxia: Secondary | ICD-10-CM | POA: Diagnosis not present

## 2022-07-24 DIAGNOSIS — G4733 Obstructive sleep apnea (adult) (pediatric): Secondary | ICD-10-CM | POA: Diagnosis not present

## 2022-07-24 DIAGNOSIS — I2699 Other pulmonary embolism without acute cor pulmonale: Secondary | ICD-10-CM | POA: Diagnosis not present

## 2022-07-30 ENCOUNTER — Other Ambulatory Visit: Payer: Self-pay | Admitting: Family

## 2022-07-30 DIAGNOSIS — E785 Hyperlipidemia, unspecified: Secondary | ICD-10-CM

## 2022-08-01 ENCOUNTER — Inpatient Hospital Stay: Payer: 59 | Attending: Internal Medicine

## 2022-08-01 ENCOUNTER — Inpatient Hospital Stay (HOSPITAL_BASED_OUTPATIENT_CLINIC_OR_DEPARTMENT_OTHER): Payer: 59 | Admitting: Internal Medicine

## 2022-08-01 ENCOUNTER — Encounter: Payer: Self-pay | Admitting: Internal Medicine

## 2022-08-01 VITALS — BP 113/71 | HR 63 | Temp 96.0°F | Resp 16 | Wt 240.1 lb

## 2022-08-01 DIAGNOSIS — Z86711 Personal history of pulmonary embolism: Secondary | ICD-10-CM | POA: Diagnosis not present

## 2022-08-01 DIAGNOSIS — M069 Rheumatoid arthritis, unspecified: Secondary | ICD-10-CM | POA: Diagnosis not present

## 2022-08-01 DIAGNOSIS — I428 Other cardiomyopathies: Secondary | ICD-10-CM | POA: Insufficient documentation

## 2022-08-01 DIAGNOSIS — C8311 Mantle cell lymphoma, lymph nodes of head, face, and neck: Secondary | ICD-10-CM | POA: Insufficient documentation

## 2022-08-01 DIAGNOSIS — I272 Pulmonary hypertension, unspecified: Secondary | ICD-10-CM | POA: Diagnosis not present

## 2022-08-01 DIAGNOSIS — Z9981 Dependence on supplemental oxygen: Secondary | ICD-10-CM | POA: Insufficient documentation

## 2022-08-01 DIAGNOSIS — E876 Hypokalemia: Secondary | ICD-10-CM

## 2022-08-01 DIAGNOSIS — Z7901 Long term (current) use of anticoagulants: Secondary | ICD-10-CM | POA: Diagnosis not present

## 2022-08-01 DIAGNOSIS — J9610 Chronic respiratory failure, unspecified whether with hypoxia or hypercapnia: Secondary | ICD-10-CM | POA: Insufficient documentation

## 2022-08-01 DIAGNOSIS — Z79899 Other long term (current) drug therapy: Secondary | ICD-10-CM | POA: Insufficient documentation

## 2022-08-01 LAB — CBC WITH DIFFERENTIAL/PLATELET
Abs Immature Granulocytes: 0.05 10*3/uL (ref 0.00–0.07)
Basophils Absolute: 0 10*3/uL (ref 0.0–0.1)
Basophils Relative: 0 %
Eosinophils Absolute: 0.1 10*3/uL (ref 0.0–0.5)
Eosinophils Relative: 2 %
HCT: 40.7 % (ref 36.0–46.0)
Hemoglobin: 13 g/dL (ref 12.0–15.0)
Immature Granulocytes: 1 %
Lymphocytes Relative: 15 %
Lymphs Abs: 1.3 10*3/uL (ref 0.7–4.0)
MCH: 30 pg (ref 26.0–34.0)
MCHC: 31.9 g/dL (ref 30.0–36.0)
MCV: 94 fL (ref 80.0–100.0)
Monocytes Absolute: 0.4 10*3/uL (ref 0.1–1.0)
Monocytes Relative: 5 %
Neutro Abs: 6.5 10*3/uL (ref 1.7–7.7)
Neutrophils Relative %: 77 %
Platelets: 208 10*3/uL (ref 150–400)
RBC: 4.33 MIL/uL (ref 3.87–5.11)
RDW: 14 % (ref 11.5–15.5)
WBC: 8.4 10*3/uL (ref 4.0–10.5)
nRBC: 0 % (ref 0.0–0.2)

## 2022-08-01 LAB — COMPREHENSIVE METABOLIC PANEL
ALT: 14 U/L (ref 0–44)
AST: 24 U/L (ref 15–41)
Albumin: 3.6 g/dL (ref 3.5–5.0)
Alkaline Phosphatase: 66 U/L (ref 38–126)
Anion gap: 8 (ref 5–15)
BUN: 14 mg/dL (ref 8–23)
CO2: 27 mmol/L (ref 22–32)
Calcium: 8.8 mg/dL — ABNORMAL LOW (ref 8.9–10.3)
Chloride: 103 mmol/L (ref 98–111)
Creatinine, Ser: 0.65 mg/dL (ref 0.44–1.00)
GFR, Estimated: >60 mL/min (ref >60)
Glucose, Bld: 90 mg/dL (ref 70–99)
Potassium: 3.5 mmol/L (ref 3.5–5.1)
Sodium: 138 mmol/L (ref 135–145)
Total Bilirubin: 0.4 mg/dL (ref 0.3–1.2)
Total Protein: 6.5 g/dL (ref 6.5–8.1)

## 2022-08-01 LAB — BRAIN NATRIURETIC PEPTIDE: B Natriuretic Peptide: 42.3 pg/mL (ref 0.0–100.0)

## 2022-08-01 LAB — LACTATE DEHYDROGENASE: LDH: 146 U/L (ref 98–192)

## 2022-08-01 MED ORDER — POTASSIUM CHLORIDE CRYS ER 10 MEQ PO TBCR: 10.0000 meq | EXTENDED_RELEASE_TABLET | Freq: Every day | ORAL | 1 refills | Status: DC

## 2022-08-01 NOTE — Assessment & Plan Note (Addendum)
#  Mantle cell lymphoma recurrent biopsy-proven [July 2022]; Currently on Calquence [ DISCONTINUED  Rituximab-given the multifocal pneumonia]-  PET scan OCT 13th, 2023- complete response;  No evidence non-Hodgkin's lymphoma/mantle cell recurrence or progression.    #Currently on Calequence 100 mg twice a day [ AUG 1st, 2022].  Tolerating well- stable. Will repeat scan in April 2024. Ordered today.   # March 31st, 2023- right upper lobe pulmonary artery branches  consistent with pulmonary embolism- on eliquis [1 year- until March 2024]- stable.  # Immunocompromised state: Hx Multifocal pneumonia-[April 2023]-Given the multiple pneumonias/high risk of death-recommended continuation of further rituximab. SEP 2023- IgG- 825.  Monitor for now. stable.   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 Continue lasix; refilled.   #Chronic respiratory failure -2 L home O2 -NICMP/pulmonary hypertension/chronic bilateral lung scarring-followed by pulmonary [Dr.Meier.[place]]; MAY 2024- HRCT pending. Discussed re: follow up locally vs [place]. Recommend speaking to pul.   # Left shoulder pain/ Chronic arthritis-on meloxicam as needed- stable.  #Vaccinations : s/p flu shot- today; discussed r: COVID booster/RSV; ? Pneumonia vaccination [check with PCP]  fridays pt pref-  # DISPOSITION:  # follow up-MD in 6 weeks//friday [MD ;labs- cbc/cmp/ldh/BNP; quantitative immunoglobulin]- PET scan-.Dr.B

## 2022-08-01 NOTE — Progress Notes (Signed)
Patient denies new problems/concerns today.    Not taken potassium for 2 weeks.  Will need a refill if she is to continue.

## 2022-08-01 NOTE — Progress Notes (Signed)
[place] OFFICE PROGRESS NOTE  Patient Care Team: Burnard Hawthorne, FNP as PCP - General (Family Medicine) End, Harrell Gave, MD as PCP - Cardiology (Cardiology) Deboraha Sprang, MD as PCP - Electrophysiology (Cardiology) Jackolyn Confer, MD (Internal Medicine) Cammie Sickle, MD as Consulting Physician (Internal Medicine)   Cancer Staging  No matching staging information was found for the patient.   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 Feb 9th- START Benda-Ritux with neulasta; Prolonged neutropenia; DISCONT- Benda-Ritux;   # April 13 th 2017- START R-CHOP x1; severe/prolonged neutropenia; PET- CR; BMBx-Neg; Disc R-CHOP # June 2022- DIAGNOSIS: thickened cortex of 12 mm. An additional lymph node demonstrates a thickened cortex of 7 mm. A third lymph node demonstrates a mildly thickened cortex of 4 mm A. LYMPH NODE, LEFT AXILLA; ULTRASOUND-GUIDED BIOPSY:  - CD5+ MONOCLONAL B-CELL POPULATION; COMPATIBLE WITH INVOLVEMENT BY THE  PATIENT'S KNOWN MANTLE CELL LYMPHOMA.   #July 2022-second week-started ibrutinib 420 mg a dayx 1 week-stop because of severe rash.  Significant clinical response noted.  # # 26th MAY 2017- Start Rituxan q 6M Main OCT 12th 2017- PET NED.    # AUG 1st, 2022- start acalbrutinib. 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 Ambulating in a wheel  chair.   Michelle [name] 82 y.o.  female pleasant patient multiple medical problems-ischemic cardiomyopathy; pulm hypertension; Hx of PE on eliquis and above history of recurrent mantle cell lymphoma on Calquence; here for follow-up.  Patient denies new problems/concerns today. Currently walking with walker. Patient admits compliance to Calquence.   Denies any headaches. Patient continues to be on 2 L of oxygen but as needed.  No further hospitalizations.  No nausea no vomiting.  No diarrhea.  No chest pain no new lumps or bumps.  Review of Systems  Constitutional:  Negative for chills, diaphoresis, fever, malaise/fatigue and weight loss.  HENT:  Negative for nosebleeds and sore throat.   Eyes:  Negative for double vision.  Respiratory:  Negative for hemoptysis, sputum production and wheezing.   Cardiovascular:  Negative for chest pain, palpitations, orthopnea and leg swelling.  Gastrointestinal:  Positive for constipation. Negative for abdominal pain, blood in stool, diarrhea, heartburn, melena, nausea and vomiting.  Genitourinary:  Negative for dysuria, frequency and urgency.  Musculoskeletal:  Positive for back pain and joint pain.  Skin: Negative.  Negative for itching and rash.  Neurological:  Negative for dizziness, tingling, focal weakness, weakness and headaches.  Endo/Heme/Allergies:  Does not bruise/bleed easily.  Psychiatric/Behavioral:  Negative for depression. The patient is not nervous/anxious and does not have insomnia.     PAST MEDICAL HISTORY :  Past Medical History:  Diagnosis Date   Arthritis    Collagen vascular disease ([place])    GERD (gastroesophageal reflux disease)    Headache(784.0)    HFrEF (heart failure with reduced ejection fraction) (HCC)    Nonischemic cardiomyopathy, LVEF as low as  30-35% in 08/2019   History of methotrexate therapy    Hyperlipidemia    hx   Lymphadenopathy of head and neck 01/2015   see on Thyroid ultrasound   Lymphoma, mantle  cell ([place]) 06/01/2015   bx of lymph node in right breast/Stage IV Mantle Cell Lymphoma   Multifocal pneumonia 10/05/2021   Personal history of chemotherapy    Pulmonary embolism ([place]) 10/05/2021   acute   Respiratory failure ([place]) 10/05/2021   acute   Rheumatoid arthritis ([place])     PAST SURGICAL HISTORY :   Past Surgical History:  Procedure Laterality Date   BREAST BIOPSY Left 11/07/2020   u/s bx-hydromark #3 "coil"-path pending   CARDIAC CATHETERIZATION  09/2004   [facility]; EF 60%   CARDIAC CATHETERIZATION  08/2004   [facility]   IR FLUORO GUIDED NEEDLE PLC ASPIRATION/INJECTION LOC  06/18/2018   PERIPHERAL VASCULAR CATHETERIZATION N/A 07/04/2015   Procedure: [name] Insertion;  Surgeon: [name], MD;  Location: [facility];  Service: Cardiovascular;  Laterality: N/A;   PORTA CATH REMOVAL N/A 06/23/2018   Procedure: PORTA CATH REMOVAL;  Surgeon: [name], MD;  Location: [facility];  Service: Cardiovascular;  Laterality: N/A;   RIGHT/LEFT HEART CATH AND CORONARY ANGIOGRAPHY Bilateral 09/20/2019   Procedure: RIGHT/LEFT HEART CATH AND CORONARY ANGIOGRAPHY;  Surgeon: Nelva Bush, MD;  Location: [facility];  Service: Cardiovascular;  Laterality: Bilateral;    FAMILY HISTORY :   Family History  Problem Relation Age of Onset   Diabetes Mother    Cholelithiasis Mother    Hypertension Sister    Diabetes Sister    Heart murmur Sister    Arthritis Brother    Breast cancer Neg Hx     SOCIAL HISTORY:   Social History   Tobacco Use   Smoking status: Never   Smokeless tobacco: Never  Vaping Use   Vaping Use: Never used  Substance Use Topics   Alcohol use: No   Drug use: No    ALLERGIES:  has No Known Allergies.  MEDICATIONS:  Current Outpatient Medications  Medication Sig Dispense Refill   acalabrutinib maleate (CALQUENCE) 100 MG tablet Take 1 tablet (100 mg total) by mouth 2 (two) times daily. 60 tablet 2   acetaminophen (TYLENOL) 500 MG tablet  Take 1,000 mg by mouth every 6 (six) hours as needed for mild pain.      albuterol (VENTOLIN HFA) 108 (90 Base) MCG/ACT inhaler 2 puffs inhaled orally every 4 to 6 hours as needed, not to exceed 12 puffs in 24 hours 18 each 1   apixaban (ELIQUIS) 5 MG TABS tablet Take 1 tablet (5 mg total) by mouth 2 (two) times daily. 90 tablet 3   carvedilol (COREG) 3.125 MG tablet TAKE 1 TABLET BY MOUTH TWICE A DAY 180 tablet 1   fluticasone (FLONASE) 50 MCG/ACT nasal spray PLACE 2 SPRAYS INTO BOTH NOSTRILS DAILY AS NEEDED FOR ALLERGIES. 48 mL 1   furosemide (LASIX) 40 MG tablet TAKE 1 TABLET BY MOUTH EVERY DAY 90 tablet 1   ipratropium-albuterol (DUONEB) 0.5-2.5 (3) MG/3ML SOLN Take 3 mLs by nebulization every 6 (six) hours as needed. 1080 mL 0   losartan (COZAAR) 25 MG tablet Take 0.5 tablets (12.5 mg total) by mouth daily. 45 tablet 3   MEGARED OMEGA-3 KRILL OIL PO Take 1 tablet by mouth daily.     Multiple Vitamins-Minerals (CENTRUM SILVER 50+WOMEN) TABS Take 1 tablet by mouth daily.  ondansetron (ZOFRAN) 8 MG tablet ONE PILL EVERY 8 HOURS AS NEEDED FOR NAUSEA/VOMITTING. 40 tablet 1   rosuvastatin (CRESTOR) 5 MG tablet TAKE 1 TABLET (5 MG TOTAL) BY MOUTH DAILY. 90 tablet 3   benzonatate (TESSALON) 100 MG capsule Take 1 capsule (100 mg total) by mouth 2 (two) times daily as needed for cough. (Patient not taking: Reported on 06/13/2022) 40 capsule 0   feeding supplement (ENSURE ENLIVE / ENSURE PLUS) LIQD Take 237 mLs by mouth 3 (three) times daily between meals. (Patient not taking: Reported on 06/13/2022) 237 mL 12   potassium chloride (KLOR-CON M) 10 MEQ tablet Take 1 tablet (10 mEq total) by mouth daily. 90 tablet 1   No current facility-administered medications for this visit.   Facility-Administered Medications Ordered in Other Visits  Medication Dose Route Frequency Provider Last Rate Last Admin   heparin lock flush 100 unit/mL  500 Units Intravenous Once Charlaine Dalton R, MD       sodium  chloride flush (NS) 0.9 % injection 10 mL  10 mL Intravenous Once Charlaine Dalton R, MD       sodium chloride flush (NS) 0.9 % injection 10 mL  10 mL Intravenous Once Cammie Sickle, MD        PHYSICAL EXAMINATION: ECOG PERFORMANCE STATUS: 0 - Asymptomatic  BP 113/71 (BP Location: Left Arm, Patient Position: Sitting)   Pulse 63   Temp (!) 96 F (35.6 C) (Tympanic)   Resp 16   Wt 240 lb 1.6 oz (108.9 kg)   BMI 37.60 kg/m   Filed Weights   08/01/22 1000  Weight: 240 lb 1.6 oz (108.9 kg)     Physical Exam HENT:     Head: Normocephalic and atraumatic.     Mouth/Throat:     Pharynx: No oropharyngeal exudate.  Eyes:     Pupils: Pupils are equal, round, and reactive to light.  Cardiovascular:     Rate and Rhythm: Normal rate and regular rhythm.  Pulmonary:     Effort: No respiratory distress.     Breath sounds: No wheezing.     Comments: Bilateral basilar crackles Abdominal:     General: Bowel sounds are normal. There is no distension.     Palpations: Abdomen is soft. There is no mass.     Tenderness: There is no abdominal tenderness. There is no guarding or rebound.  Musculoskeletal:        General: No tenderness. Normal range of motion.     Cervical back: Normal range of motion and neck supple.  Skin:    General: Skin is warm.  Neurological:     Mental Status: She is alert and oriented to person, place, and time.  Psychiatric:        Mood and Affect: Affect normal.    LABORATORY DATA:  I have reviewed the data as listed    Component Value Date/Time   NA 138 08/01/2022 1006   NA 145 (H) 09/08/2019 1359   NA 142 09/14/2013 1127   K 3.5 08/01/2022 1006   K 3.5 09/14/2013 1127   CL 103 08/01/2022 1006   CL 110 (H) 09/14/2013 1127   CO2 27 08/01/2022 1006   CO2 29 09/14/2013 1127   GLUCOSE 90 08/01/2022 1006   GLUCOSE 106 (H) 09/14/2013 1127   BUN 14 08/01/2022 1006   BUN 15 09/08/2019 1359   BUN 8 09/14/2013 1127   CREATININE 0.65 08/01/2022 1006    CREATININE 0.71 09/14/2013 1127   CREATININE  0.68 04/01/2013 1550   CALCIUM 8.8 (L) 08/01/2022 1006   CALCIUM 8.6 09/14/2013 1127   PROT 6.5 08/01/2022 1006   PROT 7.6 09/14/2013 1127   ALBUMIN 3.6 08/01/2022 1006   ALBUMIN 3.2 (L) 09/14/2013 1127   AST 24 08/01/2022 1006   AST 22 09/14/2013 1127   ALT 14 08/01/2022 1006   ALT 19 09/14/2013 1127   ALKPHOS 66 08/01/2022 1006   ALKPHOS 76 09/14/2013 1127   BILITOT 0.4 08/01/2022 1006   BILITOT 0.4 09/14/2013 1127   GFRNONAA >60 08/01/2022 1006   GFRNONAA >60 09/14/2013 1127   GFRAA >60 01/20/2020 1303   GFRAA >60 09/14/2013 1127    No results found for: "SPEP", "UPEP"  Lab Results  Component Value Date   WBC 8.4 08/01/2022   NEUTROABS 6.5 08/01/2022   HGB 13.0 08/01/2022   HCT 40.7 08/01/2022   MCV 94.0 08/01/2022   PLT 208 08/01/2022      Chemistry      Component Value Date/Time   NA 138 08/01/2022 1006   NA 145 (H) 09/08/2019 1359   NA 142 09/14/2013 1127   K 3.5 08/01/2022 1006   K 3.5 09/14/2013 1127   CL 103 08/01/2022 1006   CL 110 (H) 09/14/2013 1127   CO2 27 08/01/2022 1006   CO2 29 09/14/2013 1127   BUN 14 08/01/2022 1006   BUN 15 09/08/2019 1359   BUN 8 09/14/2013 1127   CREATININE 0.65 08/01/2022 1006   CREATININE 0.71 09/14/2013 1127   CREATININE 0.68 04/01/2013 1550      Component Value Date/Time   CALCIUM 8.8 (L) 08/01/2022 1006   CALCIUM 8.6 09/14/2013 1127   ALKPHOS 66 08/01/2022 1006   ALKPHOS 76 09/14/2013 1127   AST 24 08/01/2022 1006   AST 22 09/14/2013 1127   ALT 14 08/01/2022 1006   ALT 19 09/14/2013 1127   BILITOT 0.4 08/01/2022 1006   BILITOT 0.4 09/14/2013 1127       RADIOGRAPHIC STUDIES: I have personally reviewed the radiological images as listed and agreed with the findings in the report. No results found.   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    #Currently on Calequence 100 mg twice a day [ AUG 1st, 2022].  Tolerating well- stable. Will repeat scan in April 2024. Ordered today.   # March 31st, 2023- right upper lobe pulmonary artery branches  consistent with pulmonary embolism- on eliquis [1 year- until March 2024]- stable.  # Immunocompromised state: Hx Multifocal pneumonia-[April 2023]-Given the multiple pneumonias/high risk of death-recommended continuation of further rituximab. SEP 2023- IgG- 825.  Monitor for now. stable.   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 Continue lasix; refilled.   #Chronic respiratory failure -2 L home O2 -NICMP/pulmonary hypertension/chronic bilateral lung scarring-followed by pulmonary [Dr.[place]]; MAY 2024- HRCT pending. Discussed re: follow up locally vs [place]. Recommend speaking to pul.   # Left shoulder pain/ Chronic arthritis-on meloxicam as needed- stable.  #Vaccinations : s/p flu shot- today; discussed r: COVID booster/RSV; ? Pneumonia vaccination [check with PCP]  fridays pt pref-  #  DISPOSITION:  # follow up-MD in 6 weeks//friday [MD ;labs- cbc/cmp/ldh/BNP; quantitative immunoglobulin]- PET scan-.Dr.B        Orders Placed This Encounter  Procedures   NM PET Image Restage (PS) Skull Base to Thigh (F-18 FDG)    Standing Status:   Future    Standing Expiration Date:   08/01/2023    Order Specific Question:   If indicated for the ordered procedure, I authorize the administration of a radiopharmaceutical per Radiology protocol    Answer:   Yes    Order Specific Question:   Preferred imaging location?    Answer:   [facility]   CBC  with Differential ([place] Only)    Standing Status:   Future    Standing Expiration Date:   07/27/2023   CMP ([place] only)    Standing Status:   Future    Standing Expiration Date:   08/01/2023   Lactate dehydrogenase    Standing Status:   Future    Standing Expiration Date:   08/01/2023   Brain natriuretic peptide    Standing Status:   Future    Standing Expiration Date:   08/01/2023   Immunoglobulins, QN, A/E/G/M    Standing Status:   Future    Standing Expiration Date:   08/01/2023     All questions were answered. The patient knows to call the clinic with any problems, questions or concerns.      Cammie Sickle, MD 08/01/2022 12:56 PM

## 2022-08-04 ENCOUNTER — Telehealth: Payer: Self-pay | Admitting: Family

## 2022-08-04 ENCOUNTER — Other Ambulatory Visit: Payer: Self-pay

## 2022-08-04 DIAGNOSIS — E785 Hyperlipidemia, unspecified: Secondary | ICD-10-CM

## 2022-08-04 MED ORDER — ROSUVASTATIN CALCIUM 5 MG PO TABS: 5.0000 mg | ORAL_TABLET | Freq: Every day | ORAL | 3 refills | Status: DC

## 2022-08-04 NOTE — Telephone Encounter (Signed)
sent 

## 2022-08-04 NOTE — Telephone Encounter (Signed)
Prescription Request  08/04/2022  LOV: 11/27/2021  What is the name of the medication or equipment? rosuvastatin (CRESTOR) 5 MG tablet  Have you contacted your pharmacy to request a refill? Yes   Which pharmacy would you like this sent to?  [id number] [id number]: [phone]: [phone]    Patient notified that their request is being sent to the clinical staff for review and that they should receive a response within 2 business days.   Please advise at [place]

## 2022-08-05 ENCOUNTER — Other Ambulatory Visit (HOSPITAL_COMMUNITY): Payer: Self-pay

## 2022-08-06 LAB — IMMUNOGLOBULINS A/E/G/M, SERUM
IgA: 91 mg/dL (ref 64–422)
IgE (Immunoglobulin E), Serum: <2 IU/mL — ABNORMAL LOW (ref 6–495)
IgG (Immunoglobin G), Serum: 800 mg/dL (ref 586–1602)
IgM (Immunoglobulin M), Srm: 11 mg/dL — ABNORMAL LOW (ref 26–217)

## 2022-08-08 ENCOUNTER — Other Ambulatory Visit (HOSPITAL_COMMUNITY): Payer: Self-pay

## 2022-08-08 ENCOUNTER — Other Ambulatory Visit: Payer: Self-pay | Admitting: Medical Oncology

## 2022-08-08 ENCOUNTER — Other Ambulatory Visit: Payer: Self-pay

## 2022-08-08 DIAGNOSIS — G4733 Obstructive sleep apnea (adult) (pediatric): Secondary | ICD-10-CM | POA: Diagnosis not present

## 2022-08-08 DIAGNOSIS — I2699 Other pulmonary embolism without acute cor pulmonale: Secondary | ICD-10-CM | POA: Diagnosis not present

## 2022-08-08 DIAGNOSIS — C8311 Mantle cell lymphoma, lymph nodes of head, face, and neck: Secondary | ICD-10-CM

## 2022-08-08 DIAGNOSIS — J9601 Acute respiratory failure with hypoxia: Secondary | ICD-10-CM | POA: Diagnosis not present

## 2022-08-08 MED ORDER — CALQUENCE 100 MG PO TABS
100.0000 mg | ORAL_TABLET | Freq: Two times a day (BID) | ORAL | 2 refills | Status: DC
  Filled 2022-08-08: qty 60, 30d supply, fill #0
  Filled 2022-09-04: qty 60, 30d supply, fill #1
  Filled 2022-09-29: qty 60, 30d supply, fill #2

## 2022-08-11 ENCOUNTER — Other Ambulatory Visit: Payer: Self-pay

## 2022-08-22 DIAGNOSIS — G4733 Obstructive sleep apnea (adult) (pediatric): Secondary | ICD-10-CM | POA: Diagnosis not present

## 2022-08-22 DIAGNOSIS — I2699 Other pulmonary embolism without acute cor pulmonale: Secondary | ICD-10-CM | POA: Diagnosis not present

## 2022-08-22 DIAGNOSIS — J9601 Acute respiratory failure with hypoxia: Secondary | ICD-10-CM | POA: Diagnosis not present

## 2022-09-03 ENCOUNTER — Other Ambulatory Visit (HOSPITAL_COMMUNITY): Payer: Self-pay

## 2022-09-04 ENCOUNTER — Other Ambulatory Visit (HOSPITAL_COMMUNITY): Payer: Self-pay

## 2022-09-08 DIAGNOSIS — I2699 Other pulmonary embolism without acute cor pulmonale: Secondary | ICD-10-CM | POA: Diagnosis not present

## 2022-09-08 DIAGNOSIS — J9601 Acute respiratory failure with hypoxia: Secondary | ICD-10-CM | POA: Diagnosis not present

## 2022-09-08 DIAGNOSIS — G4733 Obstructive sleep apnea (adult) (pediatric): Secondary | ICD-10-CM | POA: Diagnosis not present

## 2022-09-11 ENCOUNTER — Ambulatory Visit
Admission: RE | Admit: 2022-09-11 | Discharge: 2022-09-11 | Disposition: A | Discharge status: Home / Self Care | Payer: 59 | Source: Ambulatory Visit | Attending: Internal Medicine | Admitting: Internal Medicine

## 2022-09-11 DIAGNOSIS — M19012 Primary osteoarthritis, left shoulder: Secondary | ICD-10-CM | POA: Insufficient documentation

## 2022-09-11 DIAGNOSIS — D259 Leiomyoma of uterus, unspecified: Secondary | ICD-10-CM | POA: Insufficient documentation

## 2022-09-11 DIAGNOSIS — M19011 Primary osteoarthritis, right shoulder: Secondary | ICD-10-CM | POA: Insufficient documentation

## 2022-09-11 DIAGNOSIS — C8311 Mantle cell lymphoma, lymph nodes of head, face, and neck: Secondary | ICD-10-CM | POA: Diagnosis not present

## 2022-09-11 DIAGNOSIS — I700 Atherosclerosis of aorta: Secondary | ICD-10-CM | POA: Insufficient documentation

## 2022-09-11 DIAGNOSIS — K8020 Calculus of gallbladder without cholecystitis without obstruction: Secondary | ICD-10-CM | POA: Insufficient documentation

## 2022-09-11 DIAGNOSIS — Z8572 Personal history of non-Hodgkin lymphomas: Secondary | ICD-10-CM | POA: Diagnosis not present

## 2022-09-11 LAB — GLUCOSE, CAPILLARY: Glucose-Capillary: 85 mg/dL (ref 70–99)

## 2022-09-11 MED ORDER — FLUDEOXYGLUCOSE F - 18 (FDG) INJECTION
12.0000 | Freq: Once | INTRAVENOUS | Status: AC | PRN
  Administered 2022-09-11: 12.24 via INTRAVENOUS

## 2022-09-19 ENCOUNTER — Inpatient Hospital Stay: Payer: 59 | Attending: Internal Medicine

## 2022-09-19 ENCOUNTER — Encounter: Payer: Self-pay | Admitting: Internal Medicine

## 2022-09-19 ENCOUNTER — Inpatient Hospital Stay: Payer: 59 | Admitting: Internal Medicine

## 2022-09-19 VITALS — BP 106/68 | HR 63 | Temp 96.9°F | Resp 20 | Wt 232.3 lb

## 2022-09-19 DIAGNOSIS — Z8261 Family history of arthritis: Secondary | ICD-10-CM | POA: Diagnosis not present

## 2022-09-19 DIAGNOSIS — Z79899 Other long term (current) drug therapy: Secondary | ICD-10-CM | POA: Diagnosis not present

## 2022-09-19 DIAGNOSIS — Z7901 Long term (current) use of anticoagulants: Secondary | ICD-10-CM | POA: Insufficient documentation

## 2022-09-19 DIAGNOSIS — C8311 Mantle cell lymphoma, lymph nodes of head, face, and neck: Secondary | ICD-10-CM

## 2022-09-19 DIAGNOSIS — Z8379 Family history of other diseases of the digestive system: Secondary | ICD-10-CM | POA: Insufficient documentation

## 2022-09-19 DIAGNOSIS — Z7964 Long term (current) use of myelosuppressive agent: Secondary | ICD-10-CM | POA: Insufficient documentation

## 2022-09-19 DIAGNOSIS — I5022 Chronic systolic (congestive) heart failure: Secondary | ICD-10-CM | POA: Diagnosis not present

## 2022-09-19 DIAGNOSIS — Z833 Family history of diabetes mellitus: Secondary | ICD-10-CM | POA: Insufficient documentation

## 2022-09-19 DIAGNOSIS — I272 Pulmonary hypertension, unspecified: Secondary | ICD-10-CM | POA: Diagnosis not present

## 2022-09-19 DIAGNOSIS — I255 Ischemic cardiomyopathy: Secondary | ICD-10-CM | POA: Diagnosis not present

## 2022-09-19 DIAGNOSIS — I110 Hypertensive heart disease with heart failure: Secondary | ICD-10-CM | POA: Insufficient documentation

## 2022-09-19 DIAGNOSIS — E785 Hyperlipidemia, unspecified: Secondary | ICD-10-CM | POA: Diagnosis not present

## 2022-09-19 DIAGNOSIS — Z8249 Family history of ischemic heart disease and other diseases of the circulatory system: Secondary | ICD-10-CM | POA: Insufficient documentation

## 2022-09-19 DIAGNOSIS — I428 Other cardiomyopathies: Secondary | ICD-10-CM | POA: Diagnosis not present

## 2022-09-19 DIAGNOSIS — Z86711 Personal history of pulmonary embolism: Secondary | ICD-10-CM | POA: Insufficient documentation

## 2022-09-19 LAB — CBC WITH DIFFERENTIAL (CANCER CENTER ONLY)
Abs Immature Granulocytes: 0.03 10*3/uL (ref 0.00–0.07)
Basophils Absolute: 0 10*3/uL (ref 0.0–0.1)
Basophils Relative: 0 %
Eosinophils Absolute: 0.1 10*3/uL (ref 0.0–0.5)
Eosinophils Relative: 1 %
HCT: 40.8 % (ref 36.0–46.0)
Hemoglobin: 13.2 g/dL (ref 12.0–15.0)
Immature Granulocytes: 0 %
Lymphocytes Relative: 18 %
Lymphs Abs: 1.4 10*3/uL (ref 0.7–4.0)
MCH: 30 pg (ref 26.0–34.0)
MCHC: 32.4 g/dL (ref 30.0–36.0)
MCV: 92.7 fL (ref 80.0–100.0)
Monocytes Absolute: 0.3 10*3/uL (ref 0.1–1.0)
Monocytes Relative: 4 %
Neutro Abs: 5.9 10*3/uL (ref 1.7–7.7)
Neutrophils Relative %: 77 %
Platelet Count: 200 10*3/uL (ref 150–400)
RBC: 4.4 MIL/uL (ref 3.87–5.11)
RDW: 14.8 % (ref 11.5–15.5)
WBC Count: 7.8 10*3/uL (ref 4.0–10.5)
nRBC: 0 % (ref 0.0–0.2)

## 2022-09-19 LAB — CMP (CANCER CENTER ONLY)
ALT: 13 U/L (ref 0–44)
AST: 23 U/L (ref 15–41)
Albumin: 3.9 g/dL (ref 3.5–5.0)
Alkaline Phosphatase: 69 U/L (ref 38–126)
Anion gap: 9 (ref 5–15)
BUN: 18 mg/dL (ref 8–23)
CO2: 28 mmol/L (ref 22–32)
Calcium: 9 mg/dL (ref 8.9–10.3)
Chloride: 100 mmol/L (ref 98–111)
Creatinine: 0.83 mg/dL (ref 0.44–1.00)
GFR, Estimated: >60 mL/min (ref >60)
Glucose, Bld: 102 mg/dL — ABNORMAL HIGH (ref 70–99)
Potassium: 4.3 mmol/L (ref 3.5–5.1)
Sodium: 137 mmol/L (ref 135–145)
Total Bilirubin: 0.5 mg/dL (ref 0.3–1.2)
Total Protein: 6.6 g/dL (ref 6.5–8.1)

## 2022-09-19 LAB — BRAIN NATRIURETIC PEPTIDE: B Natriuretic Peptide: 41.2 pg/mL (ref 0.0–100.0)

## 2022-09-19 LAB — LACTATE DEHYDROGENASE: LDH: 150 U/L (ref 98–192)

## 2022-09-19 NOTE — Assessment & Plan Note (Addendum)
#  Mantle cell lymphoma recurrent biopsy-proven [July 2022]; Currently on Calquence [ DISCONTINUED  Rituximab-given the multifocal pneumonia]-  PET scan April 11th, 2024- Stable PET-CT. No findings for recurrent lymphoma ([place] [facility]).    #Currently on Calequence 100 mg twice a day [ AUG 1st, 2022].  Tolerating well.  # March 31st, 2023- right upper lobe pulmonary artery branches  consistent with pulmonary embolism- on eliquis [1 year- until March 2024]-  stable.   # Immunocompromised state: Hx Multifocal pneumonia-[April 2023]-Given the multiple pneumonias/high risk of death-recommended continuation of further rituximab. MARCH 2024- IgG- 800.  Monitor for now. Stable.   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 Continue lasix; refilled.   #Chronic respiratory failure -2 L home O2 -NICMP/pulmonary hypertension/chronic bilateral lung scarring-followed by pulmonary [Dr.Meier.[place]]; MAY 2024- HRCT pending. Discussed re: follow up locally vs [place]. Stable.   # Left shoulder pain/ Chronic arthritis-on meloxicam as needed- stable.  #Vaccinations : s/p flu shot- today; discussed r: COVID booster/RSV; ? Pneumonia vaccination [check with PCP]  #Incidental findings on Imaging  PET-CT , 2024: Stable cholelithiasis; Stable calcified uterine fibroids; Stable advanced degenerative changes involving both shoulders.I reviewed/discussed/counseled the patient.   fridays pt pref-  # DISPOSITION:  # follow up-MD in 2 months -/friday [MD ;labs- cbc/cmp/ldh/BNP; quantitative immunoglobulin]-.Dr.B

## 2022-09-19 NOTE — Progress Notes (Signed)
Patient has no concerns 

## 2022-09-19 NOTE — Progress Notes (Signed)
[facility] OFFICE PROGRESS NOTE  Patient Care Team: Allegra Grana, FNP as PCP - General (Family Medicine) End, Cristal Deer, MD as PCP - Cardiology (Cardiology) Duke Salvia, MD as PCP - Electrophysiology (Cardiology) Shelia Media, MD (Internal Medicine) Earna Coder, MD as Consulting Physician (Internal Medicine)   Cancer Staging  No matching staging information was found for the patient.   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 Feb 9th- START Benda-Ritux with neulasta; Prolonged neutropenia; DISCONT- Benda-Ritux;   # April 13 th 2017- START R-CHOP x1; severe/prolonged neutropenia; PET- CR; BMBx-Neg; Disc R-CHOP # June 2022- DIAGNOSIS: thickened cortex of 12 mm. An additional lymph node demonstrates a thickened cortex of 7 mm. A third lymph node demonstrates a mildly thickened cortex of 4 mm A. LYMPH NODE, LEFT AXILLA; ULTRASOUND-GUIDED BIOPSY:  - CD5+ MONOCLONAL B-CELL POPULATION; COMPATIBLE WITH INVOLVEMENT BY THE  PATIENT'S KNOWN MANTLE CELL LYMPHOMA.   #July 2022-second week-started ibrutinib 420 mg a dayx 1 week-stop because of severe rash.  Significant clinical response noted.  # # 26th MAY 2017- Start Rituxan q 30M Main OCT 12th 2017- PET NED.    # AUG 1st, 2022- start acalbrutinib. 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 Ambulating in a wheel  chair.   Michelle [name] 82 y.o.  female pleasant patient multiple medical problems-ischemic cardiomyopathy; pulm hypertension; Hx of PE on eliquis and above history of recurrent mantle cell lymphoma on Calquence; here for follow-up/reviews of the PET scan.  Patient denies new problems/concerns today. Currently walking with walker. Patient admits compliance to Calquence.   Denies any headaches. Patient continues to be on 2 L of oxygen but as needed.  No further hospitalizations.  No nausea no vomiting.  No diarrhea.  No chest pain no new lumps or bumps.  Review of Systems  Constitutional:  Negative for chills, diaphoresis, fever, malaise/fatigue and weight loss.  HENT:  Negative for nosebleeds and sore throat.   Eyes:  Negative for double vision.  Respiratory:  Negative for hemoptysis, sputum production and wheezing.   Cardiovascular:  Negative for chest pain, palpitations, orthopnea and leg swelling.  Gastrointestinal:  Positive for constipation. Negative for abdominal pain, blood in stool, diarrhea, heartburn, melena, nausea and vomiting.  Genitourinary:  Negative for dysuria, frequency and urgency.  Musculoskeletal:  Positive for back pain and joint pain.  Skin: Negative.  Negative for itching and rash.  Neurological:  Negative for dizziness, tingling, focal weakness, weakness and headaches.  Endo/Heme/Allergies:  Does not bruise/bleed easily.  Psychiatric/Behavioral:  Negative for depression. The patient is not nervous/anxious and does not have insomnia.     PAST MEDICAL HISTORY :  Past Medical History:  Diagnosis Date   Arthritis    Collagen vascular disease    GERD (gastroesophageal reflux disease)    Headache(784.0)    HFrEF (heart failure with reduced ejection fraction)    Nonischemic cardiomyopathy, LVEF as  low as 30-35% in 08/2019   History of methotrexate therapy    Hyperlipidemia    hx   Lymphadenopathy of head and neck 01/2015   see on Thyroid ultrasound    Lymphoma, mantle cell 06/01/2015   bx of lymph node in right breast/Stage IV Mantle Cell Lymphoma   Multifocal pneumonia 10/05/2021   Personal history of chemotherapy    Pulmonary embolism 10/05/2021   acute   Respiratory failure 10/05/2021   acute   Rheumatoid arthritis     PAST SURGICAL HISTORY :   Past Surgical History:  Procedure Laterality Date   BREAST BIOPSY Left 11/07/2020   u/s bx-hydromark #3 "coil"-path pending   CARDIAC CATHETERIZATION  09/2004   [facility]; EF 60%   CARDIAC CATHETERIZATION  08/2004   [facility]   IR FLUORO GUIDED NEEDLE PLC ASPIRATION/INJECTION LOC  06/18/2018   PERIPHERAL VASCULAR CATHETERIZATION N/A 07/04/2015   Procedure: Shelda Pal Cath Insertion;  Surgeon: Annice Needy, MD;  Location: [facility] INVASIVE CV LAB;  Service: Cardiovascular;  Laterality: N/A;   PORTA CATH REMOVAL N/A 06/23/2018   Procedure: PORTA CATH REMOVAL;  Surgeon: Annice Needy, MD;  Location: [facility] INVASIVE CV LAB;  Service: Cardiovascular;  Laterality: N/A;   RIGHT/LEFT HEART CATH AND CORONARY ANGIOGRAPHY Bilateral 09/20/2019   Procedure: RIGHT/LEFT HEART CATH AND CORONARY ANGIOGRAPHY;  Surgeon: Yvonne Kendall, MD;  Location: [facility] INVASIVE CV LAB;  Service: Cardiovascular;  Laterality: Bilateral;    FAMILY HISTORY :   Family History  Problem Relation Age of Onset   Diabetes Mother    Cholelithiasis Mother    Hypertension Sister    Diabetes Sister    Heart murmur Sister    Arthritis Brother    Breast cancer Neg Hx     SOCIAL HISTORY:   Social History   Tobacco Use   Smoking status: Never   Smokeless tobacco: Never  Vaping Use   Vaping Use: Never used  Substance Use Topics   Alcohol use: No   Drug use: No    ALLERGIES:  has No Known Allergies.  MEDICATIONS:  Current Outpatient Medications  Medication Sig Dispense Refill   acalabrutinib maleate (CALQUENCE) 100 MG tablet Take 1 tablet (100 mg total) by mouth 2 (two) times daily. 60 tablet 2   acetaminophen (TYLENOL) 500 MG tablet Take  1,000 mg by mouth every 6 (six) hours as needed for mild pain.      albuterol (VENTOLIN HFA) 108 (90 Base) MCG/ACT inhaler 2 puffs inhaled orally every 4 to 6 hours as needed, not to exceed 12 puffs in 24 hours 18 each 1   apixaban (ELIQUIS) 5 MG TABS tablet Take 1 tablet (5 mg total) by mouth 2 (two) times daily. 90 tablet 3   carvedilol (COREG) 3.125 MG tablet TAKE 1 TABLET BY MOUTH TWICE A DAY 180 tablet 1   fluticasone (FLONASE) 50 MCG/ACT nasal spray PLACE 2 SPRAYS INTO BOTH NOSTRILS DAILY AS NEEDED FOR ALLERGIES. 48 mL 1   furosemide (LASIX) 40 MG tablet TAKE 1 TABLET BY MOUTH EVERY DAY 90 tablet 1   ipratropium-albuterol (DUONEB) 0.5-2.5 (3) MG/3ML SOLN Take 3 mLs by nebulization every 6 (six) hours as needed. 1080 mL 0   losartan (COZAAR) 25 MG tablet Take 0.5 tablets (12.5 mg total) by mouth daily. 45 tablet 3   MEGARED OMEGA-3 KRILL OIL PO Take 1 tablet by mouth daily.     Multiple Vitamins-Minerals (CENTRUM SILVER 50+WOMEN) TABS Take 1 tablet by mouth daily.     ondansetron (ZOFRAN)  8 MG tablet ONE PILL EVERY 8 HOURS AS NEEDED FOR NAUSEA/VOMITTING. 40 tablet 1   potassium chloride (KLOR-CON M) 10 MEQ tablet Take 1 tablet (10 mEq total) by mouth daily. 90 tablet 1   rosuvastatin (CRESTOR) 5 MG tablet Take 1 tablet (5 mg total) by mouth daily. 90 tablet 3   benzonatate (TESSALON) 100 MG capsule Take 1 capsule (100 mg total) by mouth 2 (two) times daily as needed for cough. (Patient not taking: Reported on 06/13/2022) 40 capsule 0   feeding supplement (ENSURE ENLIVE / ENSURE PLUS) LIQD Take 237 mLs by mouth 3 (three) times daily between meals. (Patient not taking: Reported on 06/13/2022) 237 mL 12   No current facility-administered medications for this visit.   Facility-Administered Medications Ordered in Other Visits  Medication Dose Route Frequency Provider Last Rate Last Admin   heparin lock flush 100 unit/mL  500 Units Intravenous Once Louretta Shorten R, MD       sodium chloride  flush (NS) 0.9 % injection 10 mL  10 mL Intravenous Once Louretta Shorten R, MD       sodium chloride flush (NS) 0.9 % injection 10 mL  10 mL Intravenous Once Earna Coder, MD        PHYSICAL EXAMINATION: ECOG PERFORMANCE STATUS: 0 - Asymptomatic  BP 106/68   Pulse 63   Temp (!) 96.9 F (36.1 C)   Resp 20   Wt 232 lb 4.8 oz (105.4 kg)   SpO2 100%   BMI 36.38 kg/m   Filed Weights   09/19/22 1041  Weight: 232 lb 4.8 oz (105.4 kg)     Physical Exam HENT:     Head: Normocephalic and atraumatic.     Mouth/Throat:     Pharynx: No oropharyngeal exudate.  Eyes:     Pupils: Pupils are equal, round, and reactive to light.  Cardiovascular:     Rate and Rhythm: Normal rate and regular rhythm.  Pulmonary:     Effort: No respiratory distress.     Breath sounds: No wheezing.     Comments: Bilateral basilar crackles Abdominal:     General: Bowel sounds are normal. There is no distension.     Palpations: Abdomen is soft. There is no mass.     Tenderness: There is no abdominal tenderness. There is no guarding or rebound.  Musculoskeletal:        General: No tenderness. Normal range of motion.     Cervical back: Normal range of motion and neck supple.  Skin:    General: Skin is warm.  Neurological:     Mental Status: She is alert and oriented to person, place, and time.  Psychiatric:        Mood and Affect: Affect normal.    LABORATORY DATA:  I have reviewed the data as listed    Component Value Date/Time   NA 137 09/19/2022 1025   NA 145 (H) 09/08/2019 1359   NA 142 09/14/2013 1127   K 4.3 09/19/2022 1025   K 3.5 09/14/2013 1127   CL 100 09/19/2022 1025   CL 110 (H) 09/14/2013 1127   CO2 28 09/19/2022 1025   CO2 29 09/14/2013 1127   GLUCOSE 102 (H) 09/19/2022 1025   GLUCOSE 106 (H) 09/14/2013 1127   BUN 18 09/19/2022 1025   BUN 15 09/08/2019 1359   BUN 8 09/14/2013 1127   CREATININE 0.83 09/19/2022 1025   CREATININE 0.71 09/14/2013 1127   CREATININE  0.68 04/01/2013 1550  CALCIUM 9.0 09/19/2022 1025   CALCIUM 8.6 09/14/2013 1127   PROT 6.6 09/19/2022 1025   PROT 7.6 09/14/2013 1127   ALBUMIN 3.9 09/19/2022 1025   ALBUMIN 3.2 (L) 09/14/2013 1127   AST 23 09/19/2022 1025   ALT 13 09/19/2022 1025   ALT 19 09/14/2013 1127   ALKPHOS 69 09/19/2022 1025   ALKPHOS 76 09/14/2013 1127   BILITOT 0.5 09/19/2022 1025   GFRNONAA >60 09/19/2022 1025   GFRNONAA >60 09/14/2013 1127   GFRAA >60 01/20/2020 1303   GFRAA >60 09/14/2013 1127    No results found for: "SPEP", "UPEP"  Lab Results  Component Value Date   WBC 7.8 09/19/2022   NEUTROABS 5.9 09/19/2022   HGB 13.2 09/19/2022   HCT 40.8 09/19/2022   MCV 92.7 09/19/2022   PLT 200 09/19/2022      Chemistry      Component Value Date/Time   NA 137 09/19/2022 1025   NA 145 (H) 09/08/2019 1359   NA 142 09/14/2013 1127   K 4.3 09/19/2022 1025   K 3.5 09/14/2013 1127   CL 100 09/19/2022 1025   CL 110 (H) 09/14/2013 1127   CO2 28 09/19/2022 1025   CO2 29 09/14/2013 1127   BUN 18 09/19/2022 1025   BUN 15 09/08/2019 1359   BUN 8 09/14/2013 1127   CREATININE 0.83 09/19/2022 1025   CREATININE 0.71 09/14/2013 1127   CREATININE 0.68 04/01/2013 1550      Component Value Date/Time   CALCIUM 9.0 09/19/2022 1025   CALCIUM 8.6 09/14/2013 1127   ALKPHOS 69 09/19/2022 1025   ALKPHOS 76 09/14/2013 1127   AST 23 09/19/2022 1025   ALT 13 09/19/2022 1025   ALT 19 09/14/2013 1127   BILITOT 0.5 09/19/2022 1025       RADIOGRAPHIC STUDIES: I have personally reviewed the radiological images as listed and agreed with the findings in the report. No results found.   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 No findings for recurrent lymphoma ([place] [facility]).     #Currently on Calequence 100 mg twice a day [ AUG 1st, 2022].  Tolerating well.  # March 31st, 2023- right upper lobe pulmonary artery branches  consistent with pulmonary embolism- on eliquis [1 year- until March 2024]-  stable.   # Immunocompromised state: Hx Multifocal pneumonia-[April 2023]-Given the multiple pneumonias/high risk of death-recommended continuation of further rituximab. MARCH 2024- IgG- 800.  Monitor for now. Stable.   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 Continue lasix; refilled.   #Chronic respiratory failure -2 L home O2 -NICMP/pulmonary hypertension/chronic bilateral lung scarring-followed by pulmonary [Dr.[place]]; MAY 2024- HRCT pending. Discussed re: follow up locally vs [place]. Stable.   # Left shoulder pain/ Chronic arthritis-on meloxicam as needed- stable.  #Vaccinations : s/p flu shot- today; discussed r: COVID booster/RSV; ? Pneumonia vaccination [check with PCP]  #Incidental findings on Imaging  PET-CT , 2024: Stable cholelithiasis; Stable calcified uterine fibroids; Stable advanced degenerative changes involving both shoulders.I reviewed/discussed/counseled the patient.   fridays pt pref-  # DISPOSITION:  # follow up-MD in 2 months -/friday [MD ;labs- cbc/cmp/ldh/BNP; quantitative immunoglobulin]-.Dr.B  Orders Placed This Encounter  Procedures   CBC with Differential (Cancer Center Only)    Standing Status:   Future    Standing Expiration Date:   09/19/2023   CMP (Cancer Center only)    Standing Status:   Future    Standing Expiration Date:   09/19/2023   Lactate dehydrogenase    Standing Status:   Future    Standing Expiration Date:   09/19/2023   Brain natriuretic peptide    Standing Status:   Future    Standing Expiration Date:   09/19/2023   Immunoglobulins, QN, A/E/G/M     Standing Status:   Future    Standing Expiration Date:   09/19/2023     All questions were answered. The patient knows to call the clinic with any problems, questions or concerns.      Earna Coder, MD 09/19/2022 3:00 PM

## 2022-09-22 DIAGNOSIS — G4733 Obstructive sleep apnea (adult) (pediatric): Secondary | ICD-10-CM | POA: Diagnosis not present

## 2022-09-22 DIAGNOSIS — I2699 Other pulmonary embolism without acute cor pulmonale: Secondary | ICD-10-CM | POA: Diagnosis not present

## 2022-09-22 DIAGNOSIS — J9601 Acute respiratory failure with hypoxia: Secondary | ICD-10-CM | POA: Diagnosis not present

## 2022-09-25 ENCOUNTER — Other Ambulatory Visit: Payer: Self-pay | Admitting: Internal Medicine

## 2022-09-25 LAB — IMMUNOGLOBULINS A/E/G/M, SERUM
IgA: 93 mg/dL (ref 64–422)
IgE (Immunoglobulin E), Serum: <2 IU/mL — ABNORMAL LOW (ref 6–495)
IgG (Immunoglobin G), Serum: 850 mg/dL (ref 586–1602)
IgM (Immunoglobulin M), Srm: 11 mg/dL — ABNORMAL LOW (ref 26–217)

## 2022-09-29 ENCOUNTER — Other Ambulatory Visit (HOSPITAL_COMMUNITY): Payer: Self-pay

## 2022-09-29 ENCOUNTER — Other Ambulatory Visit: Payer: Self-pay

## 2022-10-01 ENCOUNTER — Other Ambulatory Visit (HOSPITAL_COMMUNITY): Payer: Self-pay

## 2022-10-02 ENCOUNTER — Encounter: Payer: Self-pay | Admitting: Internal Medicine

## 2022-10-03 ENCOUNTER — Ambulatory Visit
Admission: RE | Admit: 2022-10-03 | Discharge: 2022-10-03 | Disposition: A | Discharge status: Home / Self Care | Payer: 59 | Source: Ambulatory Visit | Attending: Student | Admitting: Student

## 2022-10-03 DIAGNOSIS — J8489 Other specified interstitial pulmonary diseases: Secondary | ICD-10-CM | POA: Diagnosis not present

## 2022-10-03 DIAGNOSIS — J479 Bronchiectasis, uncomplicated: Secondary | ICD-10-CM | POA: Diagnosis not present

## 2022-10-03 DIAGNOSIS — J8410 Pulmonary fibrosis, unspecified: Secondary | ICD-10-CM | POA: Diagnosis not present

## 2022-10-08 DIAGNOSIS — I2699 Other pulmonary embolism without acute cor pulmonale: Secondary | ICD-10-CM | POA: Diagnosis not present

## 2022-10-08 DIAGNOSIS — J9601 Acute respiratory failure with hypoxia: Secondary | ICD-10-CM | POA: Diagnosis not present

## 2022-10-08 DIAGNOSIS — G4733 Obstructive sleep apnea (adult) (pediatric): Secondary | ICD-10-CM | POA: Diagnosis not present

## 2022-10-16 ENCOUNTER — Other Ambulatory Visit (HOSPITAL_COMMUNITY): Payer: Self-pay

## 2022-10-17 ENCOUNTER — Ambulatory Visit: Payer: 59 | Admitting: Internal Medicine

## 2022-10-22 DIAGNOSIS — I2699 Other pulmonary embolism without acute cor pulmonale: Secondary | ICD-10-CM | POA: Diagnosis not present

## 2022-10-22 DIAGNOSIS — G4733 Obstructive sleep apnea (adult) (pediatric): Secondary | ICD-10-CM | POA: Diagnosis not present

## 2022-10-22 DIAGNOSIS — J9601 Acute respiratory failure with hypoxia: Secondary | ICD-10-CM | POA: Diagnosis not present

## 2022-11-06 ENCOUNTER — Other Ambulatory Visit (HOSPITAL_COMMUNITY): Payer: Self-pay

## 2022-11-07 ENCOUNTER — Encounter: Payer: Self-pay | Admitting: Internal Medicine

## 2022-11-07 ENCOUNTER — Ambulatory Visit: Payer: 59 | Attending: Internal Medicine | Admitting: Internal Medicine

## 2022-11-07 VITALS — BP 104/64 | HR 58 | Ht 67.0 in | Wt 228.2 lb

## 2022-11-07 DIAGNOSIS — Z86711 Personal history of pulmonary embolism: Secondary | ICD-10-CM | POA: Diagnosis not present

## 2022-11-07 DIAGNOSIS — E785 Hyperlipidemia, unspecified: Secondary | ICD-10-CM

## 2022-11-07 DIAGNOSIS — I428 Other cardiomyopathies: Secondary | ICD-10-CM

## 2022-11-07 DIAGNOSIS — I5022 Chronic systolic (congestive) heart failure: Secondary | ICD-10-CM

## 2022-11-07 NOTE — Patient Instructions (Signed)
Medication Instructions:  Your Physician recommend you continue on your current medication as directed.      Please ask oncologist regarding stopping Eliquis *If you need a refill on your cardiac medications before your next appointment, please call your pharmacy*   Lab Work: None ordered today   Testing/Procedures: None ordered today   Follow-Up: At [facility], you and your health needs are our priority.  As part of our continuing mission to provide you with exceptional heart care, we have created designated Provider Care Teams.  These Care Teams include your primary Cardiologist (physician) and Advanced Practice Providers (APPs -  Physician Assistants and Nurse Practitioners) who all work together to provide you with the care you need, when you need it.  We recommend signing up for the patient portal called "MyChart".  Sign up information is provided on this After Visit Summary.  MyChart is used to connect with patients for Virtual Visits (Telemedicine).  Patients are able to view lab/test results, encounter notes, upcoming appointments, etc.  Non-urgent messages can be sent to your provider as well.   To learn more about what you can do with MyChart, go to [email or web address].    Your next appointment:   6 month(s)  Provider:   You may see Yvonne Kendall, MD or one of the following Advanced Practice Providers on your designated Care Team:   Nicolasa Ducking, NP Eula Listen, PA-C Cadence Fransico Michael, PA-C Charlsie Quest, NP

## 2022-11-07 NOTE — Progress Notes (Unsigned)
Follow-up Outpatient Visit Date: 11/07/2022  Primary Care Provider: Allegra Grana, FNP [address] [place] [address]  Chief Complaint: Follow-up heart failure  HPI:  Michelle [name] is a 82 y.o. female with history of chronic HFrEF due to nonischemic cardiomyopathy with recovered ejection fraction (LVEF as low as 30-35% in 08/2019, most recently 60-65% by echo in 08/2021), rheumatoid arthritis, mantle cell lymphoma, hyperlipidemia, and GERD, who presents for follow-up of nonischemic cardiomyopathy and heart failure.  I last saw her in December, at which time she complained of progressive dyspnea on exertion.  Echo showed slightly reduced LVEF of 45-50% with grade 1 diastolic dysfunction, mild mitral regurgitation, and normal CVP (EF previously read as 60-65% in 08/2021, though images suggested stability of mildly reduced LVEF).  Addition of low-dose losartan was recommended at that time.  She was last seen in our office in January by Eula Listen, PA, at which time she was feeling well with significant improvement in her shortness of breath.  No medication changes or additional testing were pursued at that time.  Today, Michelle [name] reports that she has been feeling fairly well though her family member who accompanies her today is concerned about intermittent cough with clear sputum production.  This worsened when Michelle [name] was not using her supplemental oxygen as much.  She has been using it more regularly over the last 2 weeks and feels like her cough has improved.  She notes some chest pain when coughing, though this is also getting better.  She has not had any edema or orthopnea.  She is losing weight.  She does not exercise regularly but tries to walk through the house and around church when possible.  She is scheduled for follow-up with Dr. Donneta Romberg next week; he previously recommended discontinuation of apixaban a year after her PE was diagnosed in  07/2021.  --------------------------------------------------------------------------------------------------  Past Medical History:  Diagnosis Date   Arthritis    Collagen vascular disease (HCC)    GERD (gastroesophageal reflux disease)    Headache(784.0)    HFrEF (heart failure with reduced ejection fraction) (HCC)    Nonischemic cardiomyopathy, LVEF as low as 30-35% in 08/2019   History of methotrexate therapy    Hyperlipidemia    hx   Lymphadenopathy of head and neck 01/2015   see on Thyroid ultrasound   Lymphoma, mantle cell (HCC) 06/01/2015   bx of lymph node in right breast/Stage IV Mantle Cell Lymphoma   Multifocal pneumonia 10/05/2021   Personal history of chemotherapy    Pulmonary embolism (HCC) 10/05/2021   acute   Respiratory failure (HCC) 10/05/2021   acute   Rheumatoid arthritis (HCC)    Past Surgical History:  Procedure Laterality Date   BREAST BIOPSY Left 11/07/2020   u/s bx-hydromark #3 "coil"-path pending   CARDIAC CATHETERIZATION  09/2004   [facility]; EF 60%   CARDIAC CATHETERIZATION  08/2004   [facility]   IR FLUORO GUIDED NEEDLE PLC ASPIRATION/INJECTION LOC  06/18/2018   PERIPHERAL VASCULAR CATHETERIZATION N/A 07/04/2015   Procedure: Shelda Pal Cath Insertion;  Surgeon: Annice Needy, MD;  Location: [facility] INVASIVE CV LAB;  Service: Cardiovascular;  Laterality: N/A;   PORTA CATH REMOVAL N/A 06/23/2018   Procedure: PORTA CATH REMOVAL;  Surgeon: Annice Needy, MD;  Location: [facility] INVASIVE CV LAB;  Service: Cardiovascular;  Laterality: N/A;   RIGHT/LEFT HEART CATH AND CORONARY ANGIOGRAPHY Bilateral 09/20/2019   Procedure: RIGHT/LEFT HEART CATH AND CORONARY ANGIOGRAPHY;  Surgeon: Yvonne Kendall, MD;  Location[facility]  INVASIVE CV LAB;  Service: Cardiovascular;  Laterality: Bilateral;    Current Meds  Medication Sig   acalabrutinib maleate (CALQUENCE) 100 MG tablet Take 1 tablet (100 mg total) by mouth 2 (two) times daily.   acetaminophen (TYLENOL) 500 MG tablet Take 1,000 mg by mouth  every 6 (six) hours as needed for mild pain.    albuterol (VENTOLIN HFA) 108 (90 Base) MCG/ACT inhaler 2 puffs inhaled orally every 4 to 6 hours as needed, not to exceed 12 puffs in 24 hours   apixaban (ELIQUIS) 5 MG TABS tablet Take 1 tablet (5 mg total) by mouth 2 (two) times daily.   benzonatate (TESSALON) 100 MG capsule Take 1 capsule (100 mg total) by mouth 2 (two) times daily as needed for cough.   carvedilol (COREG) 3.125 MG tablet TAKE 1 TABLET BY MOUTH TWICE A DAY   fluticasone (FLONASE) 50 MCG/ACT nasal spray PLACE 2 SPRAYS INTO BOTH NOSTRILS DAILY AS NEEDED FOR ALLERGIES.   furosemide (LASIX) 40 MG tablet TAKE 1 TABLET BY MOUTH EVERY DAY   ipratropium-albuterol (DUONEB) 0.5-2.5 (3) MG/3ML SOLN Take 3 mLs by nebulization every 6 (six) hours as needed.   losartan (COZAAR) 25 MG tablet Take 0.5 tablets (12.5 mg total) by mouth daily.   MEGARED OMEGA-3 KRILL OIL PO Take 1 tablet by mouth daily.   Multiple Vitamins-Minerals (CENTRUM SILVER 50+WOMEN) TABS Take 1 tablet by mouth daily.   ondansetron (ZOFRAN) 8 MG tablet ONE PILL EVERY 8 HOURS AS NEEDED FOR NAUSEA/VOMITTING.   potassium chloride (KLOR-CON M) 10 MEQ tablet Take 1 tablet (10 mEq total) by mouth daily.   rosuvastatin (CRESTOR) 5 MG tablet Take 1 tablet (5 mg total) by mouth daily.    Allergies: Patient has no known allergies.  Social History   Tobacco Use   Smoking status: Never   Smokeless tobacco: Never  Vaping Use   Vaping Use: Never used  Substance Use Topics   Alcohol use: No   Drug use: No    Family History  Problem Relation Age of Onset   Diabetes Mother    Cholelithiasis Mother    Hypertension Sister    Diabetes Sister    Heart murmur Sister    Arthritis Brother    Breast cancer Neg Hx     Review of Systems: A 12-system review of systems was performed and was negative except as noted in the  HPI.  --------------------------------------------------------------------------------------------------  Physical Exam: BP 104/64 (BP Location: Left Arm, Patient Position: Sitting, Cuff Size: Normal)   Pulse (!) 58   Ht 5\' 7"  (1.702 m)   Wt 228 lb 3.2 oz (103.5 kg)   SpO2 97%   BMI 35.74 kg/m   General:  NAD.  Seated in a wheelchair. Neck: No JVD or HJR. Lungs: Clear to auscultation bilaterally without wheezes or crackles. Heart: Regular rate and rhythm without murmurs, rubs, or gallops. Abdomen: Soft, nontender, nondistended. Extremities: No lower extremity edema.  EKG: Normal sinus rhythm with left axis deviation and left bundle branch block.  No significant change since 01/22/2022.  Lab Results  Component Value Date   WBC 7.8 09/19/2022   HGB 13.2 09/19/2022   HCT 40.8 09/19/2022   MCV 92.7 09/19/2022   PLT 200 09/19/2022    Lab Results  Component Value Date   NA 137 09/19/2022   K 4.3 09/19/2022   CL 100 09/19/2022   CO2 28 09/19/2022   BUN 18 09/19/2022   CREATININE 0.83 09/19/2022   GLUCOSE 102 (H) 09/19/2022  ALT 13 09/19/2022    Lab Results  Component Value Date   CHOL 151 10/12/2020   HDL 64.70 10/12/2020   LDLCALC 77 10/12/2020   TRIG 44.0 10/12/2020   CHOLHDL 2 10/12/2020    --------------------------------------------------------------------------------------------------  ASSESSMENT AND PLAN: Chronic heart failure with recovered ejection fraction due to nonischemic cardiomyopathy: Michelle [name] reports feeling well though her mobility remains quite limited, making it difficult to assess her functional capacity.  Given that she is feeling better with more consistent supplemental oxygen use, I have encouraged her to continue with this.  We are unable to further escalate her goal-directed medical therapy given low normal blood pressure.  History of pulmonary embolism: Is more than a year now since Michelle [name] was diagnosed with PE.  Previous  recommendations were for 1 year of anticoagulation.  I will reach out to Dr. Donneta Romberg to see if he feels like low-dose/prophylactic anticoagulation or aspirin therapy is indicated.  For now, we will continue apixaban 5 mg twice daily.  Hyperlipidemia: Lipids well-controlled on last check in 09/2020.  Defer ongoing monitoring and therapy to Michelle [name].  Follow-up: Return to clinic in 6 months.  Yvonne Kendall, MD 11/07/2022 5:06 PM

## 2022-11-08 DIAGNOSIS — G4733 Obstructive sleep apnea (adult) (pediatric): Secondary | ICD-10-CM | POA: Diagnosis not present

## 2022-11-08 DIAGNOSIS — J9601 Acute respiratory failure with hypoxia: Secondary | ICD-10-CM | POA: Diagnosis not present

## 2022-11-08 DIAGNOSIS — I2699 Other pulmonary embolism without acute cor pulmonale: Secondary | ICD-10-CM | POA: Diagnosis not present

## 2022-11-09 ENCOUNTER — Encounter: Payer: Self-pay | Admitting: Internal Medicine

## 2022-11-10 ENCOUNTER — Other Ambulatory Visit: Payer: Self-pay | Admitting: Medical Oncology

## 2022-11-10 ENCOUNTER — Other Ambulatory Visit (HOSPITAL_COMMUNITY): Payer: Self-pay

## 2022-11-10 DIAGNOSIS — C8311 Mantle cell lymphoma, lymph nodes of head, face, and neck: Secondary | ICD-10-CM

## 2022-11-11 ENCOUNTER — Other Ambulatory Visit: Payer: Self-pay

## 2022-11-11 ENCOUNTER — Other Ambulatory Visit (HOSPITAL_COMMUNITY): Payer: Self-pay

## 2022-11-11 MED ORDER — CALQUENCE 100 MG PO TABS
100.0000 mg | ORAL_TABLET | Freq: Two times a day (BID) | ORAL | 2 refills | Status: DC
  Filled 2022-11-11: qty 60, 30d supply, fill #0
  Filled 2022-12-02: qty 60, 30d supply, fill #1
  Filled 2022-12-30: qty 60, 30d supply, fill #2

## 2022-11-13 ENCOUNTER — Telehealth: Payer: Self-pay

## 2022-11-13 NOTE — Telephone Encounter (Signed)
-----   Message from Earna Coder, MD sent at 11/13/2022  2:49 PM EDT ----- Regarding: RE: Thank you for reaching out to me.  If you think patient does not need any Eliquis from cardiology standpoint, I think it is reasonable to discontinue.  Do think aspirin 81 mg a day is recommended?   O/A- please inform patient to discontinue Eliquis after current refill is over.   Thank you, GB ----- Message ----- From: Yvonne Kendall, MD Sent: 11/09/2022   5:14 PM EDT To: Earna Coder, MD  Hi Dr. Donneta Romberg,  I just want to touch base with you regarding Michelle [name] and her anticoagulation.  It looks like she is now more than 12 months out from her PE.  I believe your plan had previously been to continue therapeutic dose apixaban for 12 months.  Would it be okay to discontinue this altogether or you think she needs to be treated with long-term low-dose apixaban or aspirin for prophylaxis?  Thanks for your help.  Chris End

## 2022-11-13 NOTE — Telephone Encounter (Signed)
Patient notified to stop Eliquis after current refill is over per MD. Patient agrees and verbalizes understanding.

## 2022-11-14 ENCOUNTER — Inpatient Hospital Stay: Payer: 59 | Attending: Internal Medicine

## 2022-11-14 ENCOUNTER — Encounter: Payer: Self-pay | Admitting: Internal Medicine

## 2022-11-14 ENCOUNTER — Inpatient Hospital Stay: Payer: 59 | Admitting: Internal Medicine

## 2022-11-14 VITALS — BP 100/61 | HR 60 | Temp 96.2°F | Ht 67.0 in | Wt 229.6 lb

## 2022-11-14 DIAGNOSIS — C8311 Mantle cell lymphoma, lymph nodes of head, face, and neck: Secondary | ICD-10-CM | POA: Diagnosis not present

## 2022-11-14 DIAGNOSIS — Z7964 Long term (current) use of myelosuppressive agent: Secondary | ICD-10-CM | POA: Diagnosis not present

## 2022-11-14 DIAGNOSIS — Z86711 Personal history of pulmonary embolism: Secondary | ICD-10-CM | POA: Insufficient documentation

## 2022-11-14 DIAGNOSIS — I110 Hypertensive heart disease with heart failure: Secondary | ICD-10-CM | POA: Insufficient documentation

## 2022-11-14 DIAGNOSIS — J9610 Chronic respiratory failure, unspecified whether with hypoxia or hypercapnia: Secondary | ICD-10-CM | POA: Insufficient documentation

## 2022-11-14 DIAGNOSIS — Z9981 Dependence on supplemental oxygen: Secondary | ICD-10-CM | POA: Insufficient documentation

## 2022-11-14 DIAGNOSIS — Z79899 Other long term (current) drug therapy: Secondary | ICD-10-CM | POA: Diagnosis not present

## 2022-11-14 DIAGNOSIS — Z7901 Long term (current) use of anticoagulants: Secondary | ICD-10-CM | POA: Diagnosis not present

## 2022-11-14 DIAGNOSIS — I5022 Chronic systolic (congestive) heart failure: Secondary | ICD-10-CM | POA: Diagnosis not present

## 2022-11-14 LAB — CBC WITH DIFFERENTIAL (CANCER CENTER ONLY)
Abs Immature Granulocytes: 0.04 10*3/uL (ref 0.00–0.07)
Basophils Absolute: 0 10*3/uL (ref 0.0–0.1)
Basophils Relative: 0 %
Eosinophils Absolute: 0.1 10*3/uL (ref 0.0–0.5)
Eosinophils Relative: 2 %
HCT: 38.8 % (ref 36.0–46.0)
Hemoglobin: 12.3 g/dL (ref 12.0–15.0)
Immature Granulocytes: 1 %
Lymphocytes Relative: 20 %
Lymphs Abs: 1.6 10*3/uL (ref 0.7–4.0)
MCH: 30.2 pg (ref 26.0–34.0)
MCHC: 31.7 g/dL (ref 30.0–36.0)
MCV: 95.3 fL (ref 80.0–100.0)
Monocytes Absolute: 0.5 10*3/uL (ref 0.1–1.0)
Monocytes Relative: 6 %
Neutro Abs: 5.8 10*3/uL (ref 1.7–7.7)
Neutrophils Relative %: 71 %
Platelet Count: 206 10*3/uL (ref 150–400)
RBC: 4.07 MIL/uL (ref 3.87–5.11)
RDW: 14.6 % (ref 11.5–15.5)
WBC Count: 8 10*3/uL (ref 4.0–10.5)
nRBC: 0 % (ref 0.0–0.2)

## 2022-11-14 LAB — CMP (CANCER CENTER ONLY)
ALT: 13 U/L (ref 0–44)
AST: 24 U/L (ref 15–41)
Albumin: 3.7 g/dL (ref 3.5–5.0)
Alkaline Phosphatase: 65 U/L (ref 38–126)
Anion gap: 10 (ref 5–15)
BUN: 13 mg/dL (ref 8–23)
CO2: 30 mmol/L (ref 22–32)
Calcium: 9.2 mg/dL (ref 8.9–10.3)
Chloride: 100 mmol/L (ref 98–111)
Creatinine: 0.72 mg/dL (ref 0.44–1.00)
GFR, Estimated: >60 mL/min (ref >60)
Glucose, Bld: 108 mg/dL — ABNORMAL HIGH (ref 70–99)
Potassium: 3.8 mmol/L (ref 3.5–5.1)
Sodium: 140 mmol/L (ref 135–145)
Total Bilirubin: 0.8 mg/dL (ref 0.3–1.2)
Total Protein: 6.6 g/dL (ref 6.5–8.1)

## 2022-11-14 LAB — LACTATE DEHYDROGENASE: LDH: 170 U/L (ref 98–192)

## 2022-11-14 LAB — BRAIN NATRIURETIC PEPTIDE: B Natriuretic Peptide: 65.2 pg/mL (ref 0.0–100.0)

## 2022-11-14 NOTE — Progress Notes (Signed)
[name] OFFICE PROGRESS NOTE  Patient Care Team: Allegra Grana, FNP as PCP - General (Family Medicine) End, Cristal Deer, MD as PCP - Cardiology (Cardiology) Duke Salvia, MD as PCP - Electrophysiology (Cardiology) Shelia Media, MD (Internal Medicine) Earna Coder, MD as Consulting Physician (Internal Medicine)   Cancer Staging  No matching staging information was found for the patient.   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 Feb 9th- START Benda-Ritux with neulasta; Prolonged neutropenia; DISCONT- Benda-Ritux;   # April 13 th 2017- START R-CHOP x1; severe/prolonged neutropenia; PET- CR; BMBx-Neg; Disc R-CHOP # June 2022- DIAGNOSIS: thickened cortex of 12 mm. An additional lymph node demonstrates a thickened cortex of 7 mm. A third lymph node demonstrates a mildly thickened cortex of 4 mm A. LYMPH NODE, LEFT AXILLA; ULTRASOUND-GUIDED BIOPSY:  - CD5+ MONOCLONAL B-CELL POPULATION; COMPATIBLE WITH INVOLVEMENT BY THE  PATIENT'S KNOWN MANTLE CELL LYMPHOMA.   #July 2022-second week-started ibrutinib 420 mg a dayx 1 week-stop because of severe rash.  Significant clinical response noted.  # # 26th MAY 2017- Start Rituxan q 28M Main OCT 12th 2017- PET NED.    # AUG 1st, 2022- start acalbrutinib. 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 Ambulating in a  wheel chair.   Michelle [name] 82 y.o.  female pleasant patient multiple medical problems-ischemic cardiomyopathy; pulm hypertension; Hx of PE on eliquis and above history of recurrent mantle cell lymphoma on Calquence; here for follow-up.   Patient admits compliance to Calquence.  Denies any headaches. Patient continues to be on 2 L of oxygen but as needed.  No further hospitalizations.  No nausea no vomiting.  No diarrhea.  No chest pain no new lumps or bumps.  Review of Systems  Constitutional:  Negative for chills, diaphoresis, fever, malaise/fatigue and weight loss.  HENT:  Negative for nosebleeds and sore throat.   Eyes:  Negative for double vision.  Respiratory:  Negative for hemoptysis, sputum production and wheezing.   Cardiovascular:  Negative for chest pain, palpitations, orthopnea and leg swelling.  Gastrointestinal:  Positive for constipation. Negative for abdominal pain, blood in stool, diarrhea, heartburn, melena, nausea and vomiting.  Genitourinary:  Negative for dysuria, frequency and urgency.  Musculoskeletal:  Positive for back pain and joint pain.  Skin: Negative.  Negative for itching and rash.  Neurological:  Negative for dizziness, tingling, focal weakness, weakness and headaches.  Endo/Heme/Allergies:  Does not bruise/bleed easily.  Psychiatric/Behavioral:  Negative for depression. The patient is not nervous/anxious and does not have insomnia.     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% in 08/2019   History of methotrexate  therapy    Hyperlipidemia    hx   Lymphadenopathy of head and neck 01/2015   see on Thyroid ultrasound   Lymphoma, mantle cell (HCC) 06/01/2015   bx of lymph node in right breast/Stage IV Mantle  Cell Lymphoma   Multifocal pneumonia 10/05/2021   Personal history of chemotherapy    Pulmonary embolism (HCC) 10/05/2021   acute   Respiratory failure (HCC) 10/05/2021   acute   Rheumatoid arthritis (HCC)     PAST SURGICAL HISTORY :   Past Surgical History:  Procedure Laterality Date   BREAST BIOPSY Left 11/07/2020   u/s bx-hydromark #3 "coil"-path pending   CARDIAC CATHETERIZATION  09/2004   [facility]; EF 60%   CARDIAC CATHETERIZATION  08/2004   [facility]   IR FLUORO GUIDED NEEDLE PLC ASPIRATION/INJECTION LOC  06/18/2018   PERIPHERAL VASCULAR CATHETERIZATION N/A 07/04/2015   Procedure: Shelda Pal Cath Insertion;  Surgeon: Annice Needy, MD;  Location: [facility] INVASIVE CV LAB;  Service: Cardiovascular;  Laterality: N/A;   PORTA CATH REMOVAL N/A 06/23/2018   Procedure: PORTA CATH REMOVAL;  Surgeon: Annice Needy, MD;  Location: [facility] INVASIVE CV LAB;  Service: Cardiovascular;  Laterality: N/A;   RIGHT/LEFT HEART CATH AND CORONARY ANGIOGRAPHY Bilateral 09/20/2019   Procedure: RIGHT/LEFT HEART CATH AND CORONARY ANGIOGRAPHY;  Surgeon: Yvonne Kendall, MD;  Location: [facility] INVASIVE CV LAB;  Service: Cardiovascular;  Laterality: Bilateral;    FAMILY HISTORY :   Family History  Problem Relation Age of Onset   Diabetes Mother    Cholelithiasis Mother    Hypertension Sister    Diabetes Sister    Heart murmur Sister    Arthritis Brother    Breast cancer Neg Hx     SOCIAL HISTORY:   Social History   Tobacco Use   Smoking status: Never   Smokeless tobacco: Never  Vaping Use   Vaping Use: Never used  Substance Use Topics   Alcohol use: No   Drug use: No    ALLERGIES:  has No Known Allergies.  MEDICATIONS:  Current Outpatient Medications  Medication Sig Dispense Refill   acalabrutinib maleate (CALQUENCE) 100 MG tablet Take 1 tablet (100 mg total) by mouth 2 (two) times daily. 60 tablet 2   acetaminophen (TYLENOL) 500 MG tablet Take 1,000 mg by mouth every 6 (six) hours as needed for mild pain.       albuterol (VENTOLIN HFA) 108 (90 Base) MCG/ACT inhaler 2 puffs inhaled orally every 4 to 6 hours as needed, not to exceed 12 puffs in 24 hours 18 each 1   apixaban (ELIQUIS) 5 MG TABS tablet Take 1 tablet (5 mg total) by mouth 2 (two) times daily. 90 tablet 3   benzonatate (TESSALON) 100 MG capsule Take 1 capsule (100 mg total) by mouth 2 (two) times daily as needed for cough. 40 capsule 0   carvedilol (COREG) 3.125 MG tablet TAKE 1 TABLET BY MOUTH TWICE A DAY 180 tablet 1   feeding supplement (ENSURE ENLIVE / ENSURE PLUS) LIQD Take 237 mLs by mouth 3 (three) times daily between meals. 237 mL 12   fluticasone (FLONASE) 50 MCG/ACT nasal spray PLACE 2 SPRAYS INTO BOTH NOSTRILS DAILY AS NEEDED FOR ALLERGIES. 48 mL 1   furosemide (LASIX) 40 MG tablet TAKE 1 TABLET BY MOUTH EVERY DAY 90 tablet 0   ipratropium-albuterol (DUONEB) 0.5-2.5 (3) MG/3ML SOLN Take 3 mLs by nebulization every 6 (six) hours as needed. 1080 mL 0   losartan (COZAAR) 25 MG tablet Take 0.5 tablets (  12.5 mg total) by mouth daily. 45 tablet 3   MEGARED OMEGA-3 KRILL OIL PO Take 1 tablet by mouth daily.     Multiple Vitamins-Minerals (CENTRUM SILVER 50+WOMEN) TABS Take 1 tablet by mouth daily.     ondansetron (ZOFRAN) 8 MG tablet ONE PILL EVERY 8 HOURS AS NEEDED FOR NAUSEA/VOMITTING. 40 tablet 1   potassium chloride (KLOR-CON M) 10 MEQ tablet Take 1 tablet (10 mEq total) by mouth daily. 90 tablet 1   rosuvastatin (CRESTOR) 5 MG tablet Take 1 tablet (5 mg total) by mouth daily. 90 tablet 3   No current facility-administered medications for this visit.   Facility-Administered Medications Ordered in Other Visits  Medication Dose Route Frequency Provider Last Rate Last Admin   heparin lock flush 100 unit/mL  500 Units Intravenous Once Louretta Shorten R, MD       sodium chloride flush (NS) 0.9 % injection 10 mL  10 mL Intravenous Once Louretta Shorten R, MD       sodium chloride flush (NS) 0.9 % injection 10 mL  10 mL Intravenous  Once Earna Coder, MD        PHYSICAL EXAMINATION: ECOG PERFORMANCE STATUS: 0 - Asymptomatic  BP 100/61 (BP Location: Left Arm, Patient Position: Sitting, Cuff Size: Large)   Pulse 60   Temp (!) 96.2 F (35.7 C) (Tympanic)   Ht 5\' 7"  (1.702 m)   Wt 229 lb 9.6 oz (104.1 kg)   SpO2 98%   BMI 35.96 kg/m   Filed Weights   11/14/22 1030  Weight: 229 lb 9.6 oz (104.1 kg)     Physical Exam HENT:     Head: Normocephalic and atraumatic.     Mouth/Throat:     Pharynx: No oropharyngeal exudate.  Eyes:     Pupils: Pupils are equal, round, and reactive to light.  Cardiovascular:     Rate and Rhythm: Normal rate and regular rhythm.  Pulmonary:     Effort: No respiratory distress.     Breath sounds: No wheezing.     Comments: Bilateral basilar crackles Abdominal:     General: Bowel sounds are normal. There is no distension.     Palpations: Abdomen is soft. There is no mass.     Tenderness: There is no abdominal tenderness. There is no guarding or rebound.  Musculoskeletal:        General: No tenderness. Normal range of motion.     Cervical back: Normal range of motion and neck supple.  Skin:    General: Skin is warm.  Neurological:     Mental Status: She is alert and oriented to person, place, and time.  Psychiatric:        Mood and Affect: Affect normal.    LABORATORY DATA:  I have reviewed the data as listed    Component Value Date/Time   NA 140 11/14/2022 1008   NA 145 (H) 09/08/2019 1359   NA 142 09/14/2013 1127   K 3.8 11/14/2022 1008   K 3.5 09/14/2013 1127   CL 100 11/14/2022 1008   CL 110 (H) 09/14/2013 1127   CO2 30 11/14/2022 1008   CO2 29 09/14/2013 1127   GLUCOSE 108 (H) 11/14/2022 1008   GLUCOSE 106 (H) 09/14/2013 1127   BUN 13 11/14/2022 1008   BUN 15 09/08/2019 1359   BUN 8 09/14/2013 1127   CREATININE 0.72 11/14/2022 1008   CREATININE 0.71 09/14/2013 1127   CREATININE 0.68 04/01/2013 1550   CALCIUM 9.2 11/14/2022 1008  CALCIUM 8.6  09/14/2013 1127   PROT 6.6 11/14/2022 1008   PROT 7.6 09/14/2013 1127   ALBUMIN 3.7 11/14/2022 1008   ALBUMIN 3.2 (L) 09/14/2013 1127   AST 24 11/14/2022 1008   ALT 13 11/14/2022 1008   ALT 19 09/14/2013 1127   ALKPHOS 65 11/14/2022 1008   ALKPHOS 76 09/14/2013 1127   BILITOT 0.8 11/14/2022 1008   GFRNONAA >60 11/14/2022 1008   GFRNONAA >60 09/14/2013 1127   GFRAA >60 01/20/2020 1303   GFRAA >60 09/14/2013 1127    No results found for: "SPEP", "UPEP"  Lab Results  Component Value Date   WBC 8.0 11/14/2022   NEUTROABS 5.8 11/14/2022   HGB 12.3 11/14/2022   HCT 38.8 11/14/2022   MCV 95.3 11/14/2022   PLT 206 11/14/2022      Chemistry      Component Value Date/Time   NA 140 11/14/2022 1008   NA 145 (H) 09/08/2019 1359   NA 142 09/14/2013 1127   K 3.8 11/14/2022 1008   K 3.5 09/14/2013 1127   CL 100 11/14/2022 1008   CL 110 (H) 09/14/2013 1127   CO2 30 11/14/2022 1008   CO2 29 09/14/2013 1127   BUN 13 11/14/2022 1008   BUN 15 09/08/2019 1359   BUN 8 09/14/2013 1127   CREATININE 0.72 11/14/2022 1008   CREATININE 0.71 09/14/2013 1127   CREATININE 0.68 04/01/2013 1550      Component Value Date/Time   CALCIUM 9.2 11/14/2022 1008   CALCIUM 8.6 09/14/2013 1127   ALKPHOS 65 11/14/2022 1008   ALKPHOS 76 09/14/2013 1127   AST 24 11/14/2022 1008   ALT 13 11/14/2022 1008   ALT 19 09/14/2013 1127   BILITOT 0.8 11/14/2022 1008       RADIOGRAPHIC STUDIES: I have personally reviewed the radiological images as listed and agreed with the findings in the report. No results found.   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 No findings for recurrent lymphoma.     #Currently on Calequence 100 mg twice a day [ AUG 1st, 2022].  Tolerating well.  Stable. Monitor for now. Will plan imaging in next 3-4 months.   # March 31st, 2023- right upper lobe pulmonary artery branches  consistent with pulmonary embolism- on eliquis [1 year- until June 2024]-  recommend come off. Discussed with Dr.End.   # Immunocompromised state: Hx Multifocal pneumonia-[April 2023]-Given the multiple pneumonias/high risk of death-recommended continuation of further rituximab. MARCH 2024- IgG- 800.  Monitor for now. Stable.   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 Continue lasix; refilled. stable.   #Chronic respiratory failure -2 L home O2 -NICMP/pulmonary hypertension/chronic bilateral lung scarring-followed by pulmonary [Dr.Meier.[place]]; MAY 2024- HRCT-  Pulmonary parenchymal pattern of fibrosis, with interval resolution of previously seen superimposed ground-glass- consistent with UIP.  refer to Dr.Dgyali re: pul HTN/ILD  pt declines follow up with Dr.Meir re: logistics/[place].   # Left shoulder pain/ Chronic arthritis-on meloxicam as needed- stable.  #Vaccinations : s/p flu shot- today; discussed r: COVID booster/RSV; ? Pneumonia vaccination [check with PCP]  #Incidental findings on Imaging  CT , 2024:  Enlarged pulmonic trunk, indicative  of pulmonary arterial hypertension. Aortic atherosclerosis reviewed/discussed/counseled the patient.   fridays pt pref-  # DISPOSITION:  # refer to Dr.Dgyali re: pul HTN/ILD  # follow up-MD in 2 months -/friday  MD ;labs- cbc/cmp/ldh/BNP; quantitative immunoglobulin]-.Dr.B  # I reviewed the blood work- with the patient in detail; also reviewed the imaging independently [as summarized above]; and with the patient in detail.     Orders Placed This Encounter  Procedures   CBC with Differential (Cancer Center Only)    Standing Status:   Future    Standing Expiration  Date:   11/14/2023   CMP (Cancer Center only)    Standing Status:   Future    Standing Expiration Date:   11/14/2023   Lactate dehydrogenase    Standing Status:   Future    Standing Expiration Date:   11/14/2023   Brain natriuretic peptide    Standing Status:   Future    Standing Expiration Date:   11/14/2023   Immunoglobulins, QN, A/E/G/M    Standing Status:   Future    Standing Expiration Date:   11/14/2023   Ambulatory referral to Pulmonology    Referral Priority:   Routine    Referral Type:   Consultation    Referral Reason:   Specialty Services Required    Requested Specialty:   Pulmonary Disease    Number of Visits Requested:   1     All questions were answered. The patient knows to call the clinic with any problems, questions or concerns.      Earna Coder, MD 11/14/2022 11:59 AM

## 2022-11-14 NOTE — Assessment & Plan Note (Addendum)
#  Mantle cell lymphoma recurrent biopsy-proven [July 2022]; Currently on Calquence [ DISCONTINUED  Rituximab-given the multifocal pneumonia]-  PET scan April 11th, 2024- Stable PET-CT. No findings for recurrent lymphoma.     #Currently on Calequence 100 mg twice a day [ AUG 1st, 2022].  Tolerating well. Stable. Monitor for now. Will plan imaging in next 3-4 months.   # March 31st, 2023- right upper lobe pulmonary artery branches  consistent with pulmonary embolism- on eliquis [1 year- until June 2024]-  recommend come off. Discussed with Dr.End.   # Immunocompromised state: Hx Multifocal pneumonia-[April 2023]-Given the multiple pneumonias/high risk of death-recommended continuation of further rituximab. MARCH 2024- IgG- 800.  Monitor for now. Stable.   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 Continue lasix; refilled. stable.   #Chronic respiratory failure -2 L home O2 -NICMP/pulmonary hypertension/chronic bilateral lung scarring-followed by pulmonary [Dr.Meier.[place]]; MAY 2024- HRCT-  Pulmonary parenchymal pattern of fibrosis, with interval resolution of previously seen superimposed ground-glass- consistent with UIP.  refer to Dr.Dgyali re: pul HTN/ILD  pt declines follow up with Dr.Meir re: logistics/[place].   # Left shoulder pain/ Chronic arthritis-on meloxicam as needed- stable.  #Vaccinations : s/p flu shot- today; discussed r: COVID booster/RSV; ? Pneumonia vaccination [check with PCP]  #Incidental findings on Imaging  CT , 2024:  Enlarged pulmonic trunk, indicative of pulmonary arterial hypertension. Aortic atherosclerosis reviewed/discussed/counseled the patient.   fridays pt pref-  # DISPOSITION:  # refer to Dr.Dgyali re: pul HTN/ILD  # follow up-MD in 2 months -/friday  MD ;labs- cbc/cmp/ldh/BNP; quantitative  immunoglobulin]-.Dr.B  # I reviewed the blood work- with the patient in detail; also reviewed the imaging independently [as summarized above]; and with the patient in detail.

## 2022-11-14 NOTE — Progress Notes (Signed)
Pt asking if she is coming off of the eliquis.

## 2022-11-21 LAB — IMMUNOGLOBULINS A/E/G/M, SERUM
IgA: 85 mg/dL (ref 64–422)
IgG (Immunoglobin G), Serum: 758 mg/dL (ref 586–1602)
IgM (Immunoglobulin M), Srm: 9 mg/dL — ABNORMAL LOW (ref 26–217)

## 2022-11-22 DIAGNOSIS — G4733 Obstructive sleep apnea (adult) (pediatric): Secondary | ICD-10-CM | POA: Diagnosis not present

## 2022-11-22 DIAGNOSIS — J9601 Acute respiratory failure with hypoxia: Secondary | ICD-10-CM | POA: Diagnosis not present

## 2022-11-22 DIAGNOSIS — I2699 Other pulmonary embolism without acute cor pulmonale: Secondary | ICD-10-CM | POA: Diagnosis not present

## 2022-12-02 ENCOUNTER — Other Ambulatory Visit (HOSPITAL_COMMUNITY): Payer: Self-pay

## 2022-12-03 ENCOUNTER — Ambulatory Visit (INDEPENDENT_AMBULATORY_CARE_PROVIDER_SITE_OTHER): Payer: 59 | Admitting: *Deleted

## 2022-12-03 VITALS — Ht 68.0 in | Wt 246.0 lb

## 2022-12-03 DIAGNOSIS — Z0000 Encounter for general adult medical examination without abnormal findings: Secondary | ICD-10-CM

## 2022-12-03 NOTE — Patient Instructions (Signed)
Michelle [name] , Thank you for taking time to come for your Medicare Wellness Visit. I appreciate your ongoing commitment to your health goals. Please review the following plan we discussed and let me know if I can assist you in the future.   These are the goals we discussed:  Goals       Healthy Lifetsyle      Stay hydrated Healthy diet Walk for exercise as tolerated      Increase physical activity (pt-stated)      Chair exercises      Patient Stated      [facility] and get in the [facility]        This is a list of the screening recommended for you and due dates:  Health Maintenance  Topic Date Due   DTaP/Tdap/Td vaccine (2 - Td or Tdap) 11/13/2019   COVID-19 Vaccine (5 - 2023-24 season) 01/31/2022   Flu Shot  01/01/2023   Medicare Annual Wellness Visit  12/03/2023   Pneumonia Vaccine  Completed   DEXA scan (bone density measurement)  Completed   Zoster (Shingles) Vaccine  Completed   HPV Vaccine  Aged Out   Colon Cancer Screening  Discontinued   Hepatitis C Screening  Discontinued    Advanced directives: Will work on paperwork and bring to the office  Conditions/risks identified: none  Next appointment: Follow up in one year for your annual wellness visit 12/07/23 11:15 am   Preventive Care 65 Years and Older, Female Preventive care refers to lifestyle choices and visits with your health care provider that can promote health and wellness. What does preventive care include? A yearly physical exam. This is also called an annual well check. Dental exams once or twice a year. Routine eye exams. Ask your health care provider how often you should have your eyes checked. Personal lifestyle choices, including: Daily care of your teeth and gums. Regular physical activity. Eating a healthy diet. Avoiding tobacco and drug use. Limiting alcohol use. Practicing safe sex. Taking low-dose aspirin every day. Taking vitamin and mineral supplements as  recommended by your health care provider. What happens during an annual well check? The services and screenings done by your health care provider during your annual well check will depend on your age, overall health, lifestyle risk factors, and family history of disease. Counseling  Your health care provider may ask you questions about your: Alcohol use. Tobacco use. Drug use. Emotional well-being. Home and relationship well-being. Sexual activity. Eating habits. History of falls. Memory and ability to understand (cognition). Work and work Astronomer. Reproductive health. Screening  You may have the following tests or measurements: Height, weight, and BMI. Blood pressure. Lipid and cholesterol levels. These may be checked every 5 years, or more frequently if you are over 34 years old. Skin check. Lung cancer screening. You may have this screening every year starting at age 64 if you have a 30-pack-year history of smoking and currently smoke or have quit within the past 15 years. Fecal occult blood test (FOBT) of the stool. You may have this test every year starting at age 1. Flexible sigmoidoscopy or colonoscopy. You may have a sigmoidoscopy every 5 years or a colonoscopy every 10 years starting at age 26. Hepatitis C blood test. Hepatitis B blood test. Sexually transmitted disease (STD) testing. Diabetes screening. This is done by checking your blood sugar (glucose) after you have not eaten for a while (fasting). You may have this done  every 1-3 years. Bone density scan. This is done to screen for osteoporosis. You may have this done starting at age 82. Mammogram. This may be done every 1-2 years. Talk to your health care provider about how often you should have regular mammograms. Talk with your health care provider about your test results, treatment options, and if necessary, the need for more tests. Vaccines  Your health care provider may recommend certain vaccines, such  as: Influenza vaccine. This is recommended every year. Tetanus, diphtheria, and acellular pertussis (Tdap, Td) vaccine. You may need a Td booster every 10 years. Zoster vaccine. You may need this after age 53. Pneumococcal 13-valent conjugate (PCV13) vaccine. One dose is recommended after age 88. Pneumococcal polysaccharide (PPSV23) vaccine. One dose is recommended after age 7. Talk to your health care provider about which screenings and vaccines you need and how often you need them. This information is not intended to replace advice given to you by your health care provider. Make sure you discuss any questions you have with your health care provider. Document Released: 06/15/2015 Document Revised: 02/06/2016 Document Reviewed: 03/20/2015 Elsevier Interactive Patient Education  2017 [place] Prevention in the Home Falls can cause injuries. They can happen to people of all ages. There are many things you can do to make your home safe and to help prevent falls. What can I do on the outside of my home? Regularly fix the edges of walkways and driveways and fix any cracks. Remove anything that might make you trip as you walk through a door, such as a raised step or threshold. Trim any bushes or trees on the path to your home. Use bright outdoor lighting. Clear any walking paths of anything that might make someone trip, such as rocks or tools. Regularly check to see if handrails are loose or broken. Make sure that both sides of any steps have handrails. Any raised decks and porches should have guardrails on the edges. Have any leaves, snow, or ice cleared regularly. Use sand or salt on walking paths during winter. Clean up any spills in your garage right away. This includes oil or grease spills. What can I do in the bathroom? Use night lights. Install grab bars by the toilet and in the tub and shower. Do not use towel bars as grab bars. Use non-skid mats or decals in the tub or  shower. If you need to sit down in the shower, use a plastic, non-slip stool. Keep the floor dry. Clean up any water that spills on the floor as soon as it happens. Remove soap buildup in the tub or shower regularly. Attach bath mats securely with double-sided non-slip rug tape. Do not have throw rugs and other things on the floor that can make you trip. What can I do in the bedroom? Use night lights. Make sure that you have a light by your bed that is easy to reach. Do not use any sheets or blankets that are too big for your bed. They should not hang down onto the floor. Have a firm chair that has side arms. You can use this for support while you get dressed. Do not have throw rugs and other things on the floor that can make you trip. What can I do in the kitchen? Clean up any spills right away. Avoid walking on wet floors. Keep items that you use a lot in easy-to-reach places. If you need to reach something above you, use a strong step stool that has  a grab bar. Keep electrical cords out of the way. Do not use floor polish or wax that makes floors slippery. If you must use wax, use non-skid floor wax. Do not have throw rugs and other things on the floor that can make you trip. What can I do with my stairs? Do not leave any items on the stairs. Make sure that there are handrails on both sides of the stairs and use them. Fix handrails that are broken or loose. Make sure that handrails are as long as the stairways. Check any carpeting to make sure that it is firmly attached to the stairs. Fix any carpet that is loose or worn. Avoid having throw rugs at the top or bottom of the stairs. If you do have throw rugs, attach them to the floor with carpet tape. Make sure that you have a light switch at the top of the stairs and the bottom of the stairs. If you do not have them, ask someone to add them for you. What else can I do to help prevent falls? Wear shoes that: Do not have high heels. Have  rubber bottoms. Are comfortable and fit you well. Are closed at the toe. Do not wear sandals. If you use a stepladder: Make sure that it is fully opened. Do not climb a closed stepladder. Make sure that both sides of the stepladder are locked into place. Ask someone to hold it for you, if possible. Clearly mark and make sure that you can see: Any grab bars or handrails. First and last steps. Where the edge of each step is. Use tools that help you move around (mobility aids) if they are needed. These include: Canes. Walkers. Scooters. Crutches. Turn on the lights when you go into a dark area. Replace any light bulbs as soon as they burn out. Set up your furniture so you have a clear path. Avoid moving your furniture around. If any of your floors are uneven, fix them. If there are any pets around you, be aware of where they are. Review your medicines with your doctor. Some medicines can make you feel dizzy. This can increase your chance of falling. Ask your doctor what other things that you can do to help prevent falls. This information is not intended to replace advice given to you by your health care provider. Make sure you discuss any questions you have with your health care provider. Document Released: 03/15/2009 Document Revised: 10/25/2015 Document Reviewed: 06/23/2014 Elsevier Interactive Patient Education  2017 [employer].

## 2022-12-03 NOTE — Progress Notes (Addendum)
Subjective:   Michelle [name] is a 82 y.o. female who presents for Medicare Annual (Subsequent) preventive examination.  Visit Complete: Virtual  I connected with  [name] on 12/08/22 by a audio enabled telemedicine application and verified that I am speaking with the correct person using two identifiers.  Patient Location: Home  Provider Location: Office/Clinic  I discussed the limitations of evaluation and management by telemedicine. The patient expressed understanding and agreed to proceed.   Review of Systems     Cardiac Risk Factors include: advanced age (>40men, >14 women);dyslipidemia;Other (see comment), Risk factor comments: LBBB     Objective:    Today's Vitals   12/03/22 0917  Weight: 246 lb (111.6 kg)  Height: 5\' 8"  (1.727 m)   Body mass index is 37.4 kg/m.     12/03/2022    9:36 AM 11/14/2022   10:30 AM 09/19/2022   10:41 AM 08/01/2022   10:35 AM 06/19/2022   10:34 AM 05/16/2022    1:13 PM 03/21/2022   11:27 AM  Advanced Directives  Does Patient Have a Medical Advance Directive? No No No No No No No  Would patient like information on creating a medical advance directive?  No - Patient declined No - Patient declined No - Patient declined No - Patient declined No - Patient declined No - Patient declined    Current Medications (verified) Outpatient Encounter Medications as of 12/03/2022  Medication Sig   acalabrutinib maleate (CALQUENCE) 100 MG tablet Take 1 tablet (100 mg total) by mouth 2 (two) times daily.   acetaminophen (TYLENOL) 500 MG tablet Take 1,000 mg by mouth every 6 (six) hours as needed for mild pain.    albuterol (VENTOLIN HFA) 108 (90 Base) MCG/ACT inhaler 2 puffs inhaled orally every 4 to 6 hours as needed, not to exceed 12 puffs in 24 hours   apixaban (ELIQUIS) 5 MG TABS tablet Take 1 tablet (5 mg total) by mouth 2 (two) times daily.   benzonatate (TESSALON) 100 MG capsule Take 1 capsule (100 mg total) by mouth 2 (two) times daily as  needed for cough.   carvedilol (COREG) 3.125 MG tablet TAKE 1 TABLET BY MOUTH TWICE A DAY   feeding supplement (ENSURE ENLIVE / ENSURE PLUS) LIQD Take 237 mLs by mouth 3 (three) times daily between meals.   fluticasone (FLONASE) 50 MCG/ACT nasal spray PLACE 2 SPRAYS INTO BOTH NOSTRILS DAILY AS NEEDED FOR ALLERGIES.   furosemide (LASIX) 40 MG tablet TAKE 1 TABLET BY MOUTH EVERY DAY   ipratropium-albuterol (DUONEB) 0.5-2.5 (3) MG/3ML SOLN Take 3 mLs by nebulization every 6 (six) hours as needed.   losartan (COZAAR) 25 MG tablet Take 0.5 tablets (12.5 mg total) by mouth daily.   MEGARED OMEGA-3 KRILL OIL PO Take 1 tablet by mouth daily.   Multiple Vitamins-Minerals (CENTRUM SILVER 50+WOMEN) TABS Take 1 tablet by mouth daily.   ondansetron (ZOFRAN) 8 MG tablet ONE PILL EVERY 8 HOURS AS NEEDED FOR NAUSEA/VOMITTING.   OXYGEN Inhale into the lungs. 2 Liters 24/7   potassium chloride (KLOR-CON M) 10 MEQ tablet Take 1 tablet (10 mEq total) by mouth daily.   rosuvastatin (CRESTOR) 5 MG tablet Take 1 tablet (5 mg total) by mouth daily.   Facility-Administered Encounter Medications as of 12/03/2022  Medication   heparin lock flush 100 unit/mL   sodium chloride flush (NS) 0.9 % injection 10 mL   sodium chloride flush (NS) 0.9 % injection 10 mL    Allergies (verified) Patient has  no known allergies.   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   Multifocal pneumonia 10/05/2021   Personal history of chemotherapy    Pulmonary  embolism (HCC) 10/05/2021   acute   Respiratory failure (HCC) 10/05/2021   acute   Rheumatoid arthritis (HCC)    Past Surgical History:  Procedure Laterality Date   BREAST BIOPSY Left 11/07/2020   u/s bx-hydromark #3 "coil"-path pending   CARDIAC CATHETERIZATION  09/2004   [facility]; EF 60%   CARDIAC CATHETERIZATION  08/2004   [facility]   IR FLUORO GUIDED NEEDLE PLC ASPIRATION/INJECTION LOC  06/18/2018   PERIPHERAL VASCULAR CATHETERIZATION N/A 07/04/2015   Procedure: Shelda Pal Cath Insertion;  Surgeon: Annice Needy, MD;  Location: [facility] INVASIVE CV LAB;  Service: Cardiovascular;  Laterality: N/A;   PORTA CATH REMOVAL N/A 06/23/2018   Procedure: PORTA CATH REMOVAL;  Surgeon: Annice Needy, MD;  Location: [facility] INVASIVE CV LAB;  Service: Cardiovascular;  Laterality: N/A;   RIGHT/LEFT HEART CATH AND CORONARY ANGIOGRAPHY Bilateral 09/20/2019   Procedure: RIGHT/LEFT HEART CATH AND CORONARY ANGIOGRAPHY;  Surgeon: Yvonne Kendall, MD;  Location: [facility] INVASIVE CV LAB;  Service: Cardiovascular;  Laterality: Bilateral;   Family History  Problem Relation Age of Onset   Diabetes Mother    Cholelithiasis Mother    Hypertension Sister    Diabetes Sister    Heart murmur Sister    Arthritis Brother    Breast cancer Neg Hx    Social History   Socioeconomic History   Marital status: Married    Spouse name: Not on file   Number of children: Not on file   Years of education: Not on file   Highest education level: Not on file  Occupational History   Not on file  Tobacco Use   Smoking status: Never   Smokeless tobacco: Never  Vaping Use   Vaping Use: Never used  Substance and Sexual Activity   Alcohol use: No   Drug use: No   Sexual activity: Never  Other Topics Concern   Not on file  Social History Narrative   ** Merged History Encounter **       Lives in [place]. Works as Clinical biochemist.   Social Determinants of Health   Financial Resource Strain: Low Risk  (12/03/2022)   Overall Financial Resource  Strain (CARDIA)    Difficulty of Paying Living Expenses: Not hard at all  Food Insecurity: No Food Insecurity (12/03/2022)   Hunger Vital Sign    Worried About Running Out of Food in the Last Year: Never true    Ran Out of Food in the Last Year: Never true  Transportation Needs: No Transportation Needs (12/03/2022)   PRAPARE - Administrator, Civil Service (Medical): No    Lack of Transportation (Non-Medical): No  Physical Activity: Inactive (12/03/2022)   Exercise Vital Sign    Days of Exercise per Week: 0 days    Minutes of Exercise per Session: 0  min  Stress: No Stress Concern Present (12/03/2022)   [employer] of Occupational Health - Occupational Stress Questionnaire    Feeling of Stress : Not at all  Social Connections: Moderately Integrated (12/03/2022)   Social Connection and Isolation Panel [NHANES]    Frequency of Communication with Friends and Family: More than three times a week    Frequency of Social Gatherings with Friends and Family: More than three times a week    Attends Religious Services: More than 4 times per year    Active Member of [employer] or Organizations: Yes    Attends Banker Meetings: More than 4 times per year    Marital Status: Widowed    Tobacco Counseling Counseling given: Not Answered   Clinical Intake:  Pre-visit preparation completed: Yes  Pain : No/denies pain     BMI - recorded: 37.4 Nutritional Status: BMI > 30  Obese Nutritional Risks: None Diabetes: No  How often do you need to have someone help you when you read instructions, pamphlets, or other written materials from your doctor or pharmacy?: 1 - Never  Interpreter Needed?: No  Information entered by :: R. Kirstyn Lean LPN   Activities of Daily Living    12/03/2022    9:21 AM  In your present state of health, do you have any difficulty performing the following activities:  Hearing? 0  Vision? 0  Difficulty concentrating or making decisions? 0  Walking or  climbing stairs? 1  Comment some difficulty  Dressing or bathing? 0  Doing errands, shopping? 0  Preparing Food and eating ? N  Using the Toilet? N  In the past six months, have you accidently leaked urine? N  Do you have problems with loss of bowel control? N  Managing your Medications? N  Managing your Finances? N  Housekeeping or managing your Housekeeping? N    Patient Care Team: Allegra Grana, FNP as PCP - General (Family Medicine) End, Cristal Deer, MD as PCP - Cardiology (Cardiology) Duke Salvia, MD as PCP - Electrophysiology (Cardiology) Shelia Media, MD (Internal Medicine) Earna Coder, MD as Consulting Physician (Internal Medicine)  Indicate any recent Medical Services you may have received from other than [facility] providers in the past year (date may be approximate).     Assessment:   This is a routine wellness examination for [place].  Hearing/Vision screen No results found.  Dietary issues and exercise activities discussed:     Goals Addressed             This Visit's Progress    Patient Stated       [facility] and get in [facility]       Depression Screen    12/03/2022    9:30 AM 02/20/2022   10:27 AM 11/27/2021    2:33 PM 11/15/2021    3:12 PM 10/02/2021    8:42 AM 07/26/2021   11:23 AM 07/25/2021    4:43 PM  PHQ 2/9 Scores  PHQ - 2 Score 0 0 0 0 0 0 0  PHQ- 9 Score 0  0 0 0 0   Exception Documentation       Patient refusal    Fall Risk    11/27/2021    2:32 PM 11/15/2021    3:17 PM 10/02/2021    8:41 AM 07/26/2021   11:24 AM 07/25/2021    4:42 PM  Fall Risk   Falls in the past year? 0  0 0 0 0  Number falls in past yr: 0  0 0 0  Injury with Fall? 0  0 0 0  Risk for fall due to : No Fall Risks Impaired balance/gait No Fall Risks No Fall Risks No Fall Risks  Risk for fall due to: Comment  Walker/Cane     Follow up Falls evaluation completed Education provided Falls evaluation completed Falls  evaluation completed Falls evaluation completed    MEDICARE RISK AT HOME:    Cognitive Function:    11/07/2016    3:37 PM  MMSE - Mini Mental State Exam  Orientation to time 5  Orientation to Place 5  Registration 3  Attention/ Calculation 3  Attention/Calculation-comments Some difficulty calculating   Recall 3  Language- name 2 objects 2  Language- repeat 1  Language- follow 3 step command 3  Language- read & follow direction 1  Write a sentence 1  Copy design 1  Total score 28        12/03/2022    9:37 AM 11/14/2019   10:45 AM 11/12/2018   10:10 AM 11/09/2017    3:29 PM  6CIT Screen  What Year? 0 points 0 points 0 points 0 points  What month? 0 points 0 points 0 points 0 points  What time? 0 points 0 points 0 points 0 points  Count back from 20 2 points 0 points 0 points 0 points  Months in reverse 4 points   4 points  Repeat phrase 0 points   0 points  Total Score 6 points   4 points    Immunizations Immunization History  Administered Date(s) Administered   Fluad Quad(high Dose 65+) 03/21/2022   Influenza Split 02/18/2012   Influenza, High Dose Seasonal PF 01/29/2017, 02/19/2018, 02/23/2020   Influenza,inj,Quad PF,6+ Mos 04/01/2013, 03/02/2014, 06/25/2015   Influenza-Unspecified 03/14/2016, 02/23/2019   PFIZER(Purple Top)SARS-COV-2 Vaccination 07/18/2019, 08/08/2019, 03/24/2020, 08/07/2020   PNEUMOCOCCAL CONJUGATE-20 07/17/2022   Pneumococcal Conjugate-13 01/12/2014   Pneumococcal Polysaccharide-23 11/12/2009   Tdap 11/12/2009   Zoster Recombinant(Shingrix) 03/15/2019, 05/20/2019    TDAP status: Due, Education has been provided regarding the importance of this vaccine. Advised may receive this vaccine at local pharmacy or Health Dept. Aware to provide a copy of the vaccination record if obtained from local pharmacy or Health Dept. Verbalized acceptance and understanding.  Flu Vaccine status: Up to date  Pneumococcal vaccine status: Up to date  Covid-19  vaccine status: Completed vaccines  Qualifies for Shingles Vaccine? Yes   Zostavax completed No   Shingrix Completed?: Yes  Screening Tests Health Maintenance  Topic Date Due   DTaP/Tdap/Td (2 - Td or Tdap) 11/13/2019   COVID-19 Vaccine (5 - 2023-24 season) 01/31/2022   INFLUENZA VACCINE  01/01/2023   Medicare Annual Wellness (AWV)  12/03/2023   Pneumonia Vaccine 37+ Years old  Completed   DEXA SCAN  Completed   Zoster Vaccines- Shingrix  Completed   HPV VACCINES  Aged Out   Colonoscopy  Discontinued   Hepatitis C Screening  Discontinued    Health Maintenance  Health Maintenance Due  Topic Date Due   DTaP/Tdap/Td (2 - Td or Tdap) 11/13/2019   COVID-19 Vaccine (5 - 2023-24 season) 01/31/2022    Colonoscopy  completed 6/12 aged out. Patient does not want to have any more .  Mammogram 9/23, aged out. Patient prefers not to have any more  Bone Density status: Completed 10/31/20. Results reflect: Bone density results: NORMAL. Repeat every 5 years.  Lung Cancer Screening: (Low  Dose CT Chest recommended if Age 50-80 years, 20 pack-year currently smoking OR have quit w/in 15years.) does not qualify.     Additional Screening:  Hepatitis C Screening: does not qualify; Completed 9/22  Vision Screening: Recommended annual ophthalmology exams for early detection of glaucoma and other disorders of the eye. Is the patient up to date with their annual eye exam?  Yes  Who is the provider or what is the name of the office in which the patient attends annual eye exams? [facility], patient unsure of the name of the practice If pt is not established with a provider, would they like to be referred to a provider to establish care? No .   Dental Screening: Recommended annual dental exams for proper oral hygiene    Community Resource Referral / Chronic Care Management: CRR required this visit?  No   CCM required this visit?  No     Plan:     I have personally reviewed and noted the  following in the patient's chart:   Medical and social history Use of alcohol, tobacco or illicit drugs  Current medications and supplements including opioid prescriptions. Patient is not currently taking opioid prescriptions. Functional ability and status Nutritional status Physical activity Advanced directives List of other physicians Hospitalizations, surgeries, and ER visits in previous 12 months Vitals Screenings to include cognitive, depression, and falls Referrals and appointments  In addition, I have reviewed and discussed with patient certain preventive protocols, quality metrics, and best practice recommendations. A written personalized care plan for preventive services as well as general preventive health recommendations were provided to patient.     Sydell Axon, LPN   4/0/9811   After Visit Summary: (Declined) Due to this being a telephonic visit, with patients personalized plan was offered to patient but patient Declined AVS at this time   Nurse Notes: Patient stated that she has been waiting on a referral from her cancer doctor to a pulmonologist. Patient stated that she has not heard anything back about the referral and plans on reaching out to her cancer doctor about the referral.

## 2022-12-05 ENCOUNTER — Other Ambulatory Visit: Payer: Self-pay

## 2022-12-08 ENCOUNTER — Telehealth: Payer: Self-pay | Admitting: Family

## 2022-12-08 DIAGNOSIS — I2699 Other pulmonary embolism without acute cor pulmonale: Secondary | ICD-10-CM | POA: Diagnosis not present

## 2022-12-08 DIAGNOSIS — G4733 Obstructive sleep apnea (adult) (pediatric): Secondary | ICD-10-CM | POA: Diagnosis not present

## 2022-12-08 DIAGNOSIS — J9601 Acute respiratory failure with hypoxia: Secondary | ICD-10-CM | POA: Diagnosis not present

## 2022-12-08 NOTE — Telephone Encounter (Signed)
Called and spoke to pt. Gave her the number to call and scheduled her appt. She called back the name and number to the doctor.

## 2022-12-08 NOTE — Telephone Encounter (Signed)
Call pt  She mentioned in medicare wellness call that oncology Dr Donneta Romberg had referred her to pulmonology, Dr Elgie Collard  He referred her last month and they tried to reach her per notes and were unable too.   Please ask her to call pulmonology to schedule  If any issues , please ask her to send dr Donneta Romberg a mychart note to make him aware since he placed referral  [phone] Dr Elgie Collard

## 2022-12-08 NOTE — Telephone Encounter (Signed)
Called pt but she does not have vm set up to leave a message to return call to the office.

## 2022-12-08 NOTE — Telephone Encounter (Signed)
Patient returned office phone call. 

## 2022-12-14 ENCOUNTER — Other Ambulatory Visit: Payer: Self-pay | Admitting: Internal Medicine

## 2022-12-22 DIAGNOSIS — I2699 Other pulmonary embolism without acute cor pulmonale: Secondary | ICD-10-CM | POA: Diagnosis not present

## 2022-12-22 DIAGNOSIS — J9601 Acute respiratory failure with hypoxia: Secondary | ICD-10-CM | POA: Diagnosis not present

## 2022-12-22 DIAGNOSIS — G4733 Obstructive sleep apnea (adult) (pediatric): Secondary | ICD-10-CM | POA: Diagnosis not present

## 2022-12-24 ENCOUNTER — Other Ambulatory Visit: Payer: Self-pay | Admitting: Internal Medicine

## 2022-12-24 NOTE — Telephone Encounter (Signed)
Prescription refill request for Eliquis received. Indication: PE Last office visit: 11/07/22  C End MD Scr: 0.72 on 11/14/22 Age: 82 Weight: 103.5kg  Based on above findings Eliquis 5mg  twice daily is the appropriate dose.  Refill approved.

## 2022-12-24 NOTE — Telephone Encounter (Signed)
Refill request

## 2022-12-30 ENCOUNTER — Other Ambulatory Visit (HOSPITAL_COMMUNITY): Payer: Self-pay

## 2023-01-06 ENCOUNTER — Other Ambulatory Visit: Payer: Self-pay | Admitting: Internal Medicine

## 2023-01-06 ENCOUNTER — Other Ambulatory Visit: Payer: Self-pay

## 2023-01-06 DIAGNOSIS — E876 Hypokalemia: Secondary | ICD-10-CM

## 2023-01-08 DIAGNOSIS — I2699 Other pulmonary embolism without acute cor pulmonale: Secondary | ICD-10-CM | POA: Diagnosis not present

## 2023-01-08 DIAGNOSIS — J9601 Acute respiratory failure with hypoxia: Secondary | ICD-10-CM | POA: Diagnosis not present

## 2023-01-08 DIAGNOSIS — G4733 Obstructive sleep apnea (adult) (pediatric): Secondary | ICD-10-CM | POA: Diagnosis not present

## 2023-01-16 ENCOUNTER — Encounter: Payer: Self-pay | Admitting: Internal Medicine

## 2023-01-16 ENCOUNTER — Inpatient Hospital Stay: Payer: 59 | Admitting: Internal Medicine

## 2023-01-16 ENCOUNTER — Inpatient Hospital Stay: Payer: 59 | Attending: Internal Medicine

## 2023-01-16 DIAGNOSIS — Z7964 Long term (current) use of myelosuppressive agent: Secondary | ICD-10-CM | POA: Diagnosis not present

## 2023-01-16 DIAGNOSIS — I255 Ischemic cardiomyopathy: Secondary | ICD-10-CM | POA: Insufficient documentation

## 2023-01-16 DIAGNOSIS — C8311 Mantle cell lymphoma, lymph nodes of head, face, and neck: Secondary | ICD-10-CM | POA: Diagnosis not present

## 2023-01-16 DIAGNOSIS — I110 Hypertensive heart disease with heart failure: Secondary | ICD-10-CM | POA: Insufficient documentation

## 2023-01-16 DIAGNOSIS — Z79899 Other long term (current) drug therapy: Secondary | ICD-10-CM | POA: Insufficient documentation

## 2023-01-16 DIAGNOSIS — I272 Pulmonary hypertension, unspecified: Secondary | ICD-10-CM | POA: Insufficient documentation

## 2023-01-16 DIAGNOSIS — Z7901 Long term (current) use of anticoagulants: Secondary | ICD-10-CM | POA: Insufficient documentation

## 2023-01-16 DIAGNOSIS — Z86711 Personal history of pulmonary embolism: Secondary | ICD-10-CM | POA: Insufficient documentation

## 2023-01-16 LAB — CMP (CANCER CENTER ONLY)
ALT: 13 U/L (ref 0–44)
AST: 21 U/L (ref 15–41)
Albumin: 3.6 g/dL (ref 3.5–5.0)
Alkaline Phosphatase: 67 U/L (ref 38–126)
Anion gap: 7 (ref 5–15)
BUN: 15 mg/dL (ref 8–23)
CO2: 29 mmol/L (ref 22–32)
Calcium: 8.8 mg/dL — ABNORMAL LOW (ref 8.9–10.3)
Chloride: 103 mmol/L (ref 98–111)
Creatinine: 0.74 mg/dL (ref 0.44–1.00)
GFR, Estimated: >60 mL/min (ref >60)
Glucose, Bld: 103 mg/dL — ABNORMAL HIGH (ref 70–99)
Potassium: 3.7 mmol/L (ref 3.5–5.1)
Sodium: 139 mmol/L (ref 135–145)
Total Bilirubin: 0.5 mg/dL (ref 0.3–1.2)
Total Protein: 6.4 g/dL — ABNORMAL LOW (ref 6.5–8.1)

## 2023-01-16 LAB — CBC WITH DIFFERENTIAL (CANCER CENTER ONLY)
Abs Immature Granulocytes: 0.04 K/uL (ref 0.00–0.07)
Basophils Absolute: 0 K/uL (ref 0.0–0.1)
Basophils Relative: 0 %
Eosinophils Absolute: 0.1 K/uL (ref 0.0–0.5)
Eosinophils Relative: 2 %
HCT: 39 % (ref 36.0–46.0)
Hemoglobin: 12 g/dL (ref 12.0–15.0)
Immature Granulocytes: 1 %
Lymphocytes Relative: 19 %
Lymphs Abs: 1.6 K/uL (ref 0.7–4.0)
MCH: 29.6 pg (ref 26.0–34.0)
MCHC: 30.8 g/dL (ref 30.0–36.0)
MCV: 96.3 fL (ref 80.0–100.0)
Monocytes Absolute: 0.4 K/uL (ref 0.1–1.0)
Monocytes Relative: 5 %
Neutro Abs: 6.3 K/uL (ref 1.7–7.7)
Neutrophils Relative %: 73 %
Platelet Count: 193 K/uL (ref 150–400)
RBC: 4.05 MIL/uL (ref 3.87–5.11)
RDW: 13.8 % (ref 11.5–15.5)
WBC Count: 8.4 K/uL (ref 4.0–10.5)
nRBC: 0 % (ref 0.0–0.2)

## 2023-01-16 LAB — LACTATE DEHYDROGENASE: LDH: 160 U/L (ref 98–192)

## 2023-01-16 LAB — BRAIN NATRIURETIC PEPTIDE: B Natriuretic Peptide: 97.7 pg/mL (ref 0.0–100.0)

## 2023-01-16 NOTE — Progress Notes (Signed)
Appetite about half of what it used to be. No pain today. Usually has arthritis pain in her bil knees and lower back. Denies night sweats. No fevers .Energy is okay. Oxygen dependent.

## 2023-01-16 NOTE — Progress Notes (Signed)
[facility] OFFICE PROGRESS NOTE  Patient Care Team: Allegra Grana, FNP as PCP - General (Family Medicine) End, Cristal Deer, MD as PCP - Cardiology (Cardiology) Duke Salvia, MD as PCP - Electrophysiology (Cardiology) Shelia Media, MD (Internal Medicine) Earna Coder, MD as Consulting Physician (Internal Medicine)   Cancer Staging  No matching staging information was found for the patient.    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 Feb 9th- START Benda-Ritux with neulasta; Prolonged neutropenia; DISCONT- Benda-Ritux;   # April 13 th 2017- START R-CHOP x1; severe/prolonged neutropenia; PET- CR; BMBx-Neg; Disc R-CHOP # June 2022- DIAGNOSIS: thickened cortex of 12 mm. An additional lymph node demonstrates a thickened cortex of 7 mm. A third lymph node demonstrates a mildly thickened cortex of 4 mm A. LYMPH NODE, LEFT AXILLA; ULTRASOUND-GUIDED BIOPSY:  - CD5+ MONOCLONAL B-CELL POPULATION; COMPATIBLE WITH INVOLVEMENT BY THE  PATIENT'S KNOWN MANTLE CELL LYMPHOMA.   #July 2022-second week-started ibrutinib 420 mg a dayx 1 week-stop because of severe rash.  Significant clinical response noted.  # # 26th MAY 2017- Start Rituxan q 107M Main OCT 12th 2017- PET NED.    # AUG 1st, 2022- start acalbrutinib. 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 Ambulating in  a wheel chair.   Michelle [name] 82 y.o.  female pleasant patient multiple medical problems-ischemic cardiomyopathy; pulm hypertension; Hx of PE on eliquis and above history of recurrent mantle cell lymphoma on Calquence; here for follow-up.  No further hospitalizations.  Appetite about half of what it used to be. No pain today. Usually has arthritis pain in her bil knees and lower back. Denies night sweats. No fevers .Energy is okay.   Patient admits compliance to Calquence.  Denies any headaches. Patient continues to be on 2 L of oxygen but as needed.    No nausea no vomiting.  No diarrhea.  No chest pain no new lumps or bumps.  Review of Systems  Constitutional:  Negative for chills, diaphoresis, fever, malaise/fatigue and weight loss.  HENT:  Negative for nosebleeds and sore throat.   Eyes:  Negative for double vision.  Respiratory:  Negative for hemoptysis, sputum production and wheezing.   Cardiovascular:  Negative for chest pain, palpitations, orthopnea and leg swelling.  Gastrointestinal:  Positive for constipation. Negative for abdominal pain, blood in stool, diarrhea, heartburn, melena, nausea and vomiting.  Genitourinary:  Negative for dysuria, frequency and urgency.  Musculoskeletal:  Positive for back pain and joint pain.  Skin: Negative.  Negative for itching and rash.  Neurological:  Negative for dizziness, tingling, focal weakness, weakness and headaches.  Endo/Heme/Allergies:  Does not bruise/bleed easily.  Psychiatric/Behavioral:  Negative for depression. The patient is not nervous/anxious and does not have insomnia.     PAST MEDICAL HISTORY :  Past Medical History:  Diagnosis Date   Arthritis    Collagen vascular disease (HCC)    GERD (  gastroesophageal reflux disease)    Headache(784.0)    HFrEF (heart failure with reduced ejection fraction) (HCC)    Nonischemic cardiomyopathy, LVEF as low as 30-35% in 08/2019   History of methotrexate therapy     Hyperlipidemia    hx   Lymphadenopathy of head and neck 01/2015   see on Thyroid ultrasound   Lymphoma, mantle cell (HCC) 06/01/2015   bx of lymph node in right breast/Stage IV Mantle Cell Lymphoma   Multifocal pneumonia 10/05/2021   Personal history of chemotherapy    Pulmonary embolism (HCC) 10/05/2021   acute   Respiratory failure (HCC) 10/05/2021   acute   Rheumatoid arthritis (HCC)     PAST SURGICAL HISTORY :   Past Surgical History:  Procedure Laterality Date   BREAST BIOPSY Left 11/07/2020   u/s bx-hydromark #3 "coil"-path pending   CARDIAC CATHETERIZATION  09/2004   [facility]; EF 60%   CARDIAC CATHETERIZATION  08/2004   [facility]   IR FLUORO GUIDED NEEDLE PLC ASPIRATION/INJECTION LOC  06/18/2018   PERIPHERAL VASCULAR CATHETERIZATION N/A 07/04/2015   Procedure: Shelda Pal Cath Insertion;  Surgeon: Annice Needy, MD;  Location: [facility] INVASIVE CV LAB;  Service: Cardiovascular;  Laterality: N/A;   PORTA CATH REMOVAL N/A 06/23/2018   Procedure: PORTA CATH REMOVAL;  Surgeon: Annice Needy, MD;  Location: [facility] INVASIVE CV LAB;  Service: Cardiovascular;  Laterality: N/A;   RIGHT/LEFT HEART CATH AND CORONARY ANGIOGRAPHY Bilateral 09/20/2019   Procedure: RIGHT/LEFT HEART CATH AND CORONARY ANGIOGRAPHY;  Surgeon: Yvonne Kendall, MD;  Location: [facility] INVASIVE CV LAB;  Service: Cardiovascular;  Laterality: Bilateral;    FAMILY HISTORY :   Family History  Problem Relation Age of Onset   Diabetes Mother    Cholelithiasis Mother    Hypertension Sister    Diabetes Sister    Heart murmur Sister    Arthritis Brother    Breast cancer Neg Hx     SOCIAL HISTORY:   Social History   Tobacco Use   Smoking status: Never   Smokeless tobacco: Never  Vaping Use   Vaping status: Never Used  Substance Use Topics   Alcohol use: No   Drug use: No    ALLERGIES:  has No Known Allergies.  MEDICATIONS:  Current Outpatient Medications  Medication Sig Dispense Refill   acalabrutinib maleate (CALQUENCE) 100 MG  tablet Take 1 tablet (100 mg total) by mouth 2 (two) times daily. 60 tablet 2   acetaminophen (TYLENOL) 500 MG tablet Take 1,000 mg by mouth every 6 (six) hours as needed for mild pain.      albuterol (VENTOLIN HFA) 108 (90 Base) MCG/ACT inhaler 2 puffs inhaled orally every 4 to 6 hours as needed, not to exceed 12 puffs in 24 hours 18 each 1   apixaban (ELIQUIS) 5 MG TABS tablet TAKE 1 TABLET BY MOUTH TWICE A DAY 60 tablet 5   benzonatate (TESSALON) 100 MG capsule TAKE 1 CAPSULE BY MOUTH 2 TIMES DAILY AS NEEDED FOR COUGH. 40 capsule 0   carvedilol (COREG) 3.125 MG tablet TAKE 1 TABLET BY MOUTH TWICE A DAY 180 tablet 1   feeding supplement (ENSURE ENLIVE / ENSURE PLUS) LIQD Take 237 mLs by mouth 3 (three) times daily between meals. 237 mL 12   fluticasone (FLONASE) 50 MCG/ACT nasal spray PLACE 2 SPRAYS INTO BOTH NOSTRILS DAILY AS NEEDED FOR ALLERGIES. 48 mL 1   furosemide (LASIX) 40 MG tablet TAKE 1 TABLET BY MOUTH EVERY DAY 90 tablet 1   KLOR-CON M10 10  MEQ tablet TAKE 1 TABLET BY MOUTH EVERY DAY 90 tablet 1   losartan (COZAAR) 25 MG tablet Take 0.5 tablets (12.5 mg total) by mouth daily. 45 tablet 3   MEGARED OMEGA-3 KRILL OIL PO Take 1 tablet by mouth daily.     Multiple Vitamins-Minerals (CENTRUM SILVER 50+WOMEN) TABS Take 1 tablet by mouth daily.     OXYGEN Inhale into the lungs. 2 Liters 24/7     rosuvastatin (CRESTOR) 5 MG tablet Take 1 tablet (5 mg total) by mouth daily. 90 tablet 3   ipratropium-albuterol (DUONEB) 0.5-2.5 (3) MG/3ML SOLN Take 3 mLs by nebulization every 6 (six) hours as needed. (Patient not taking: Reported on 01/16/2023) 1080 mL 0   ondansetron (ZOFRAN) 8 MG tablet ONE PILL EVERY 8 HOURS AS NEEDED FOR NAUSEA/VOMITTING. (Patient not taking: Reported on 01/16/2023) 40 tablet 1   No current facility-administered medications for this visit.   Facility-Administered Medications Ordered in Other Visits  Medication Dose Route Frequency Provider Last Rate Last Admin   heparin  lock flush 100 unit/mL  500 Units Intravenous Once Louretta Shorten R, MD       sodium chloride flush (NS) 0.9 % injection 10 mL  10 mL Intravenous Once Louretta Shorten R, MD       sodium chloride flush (NS) 0.9 % injection 10 mL  10 mL Intravenous Once Earna Coder, MD        PHYSICAL EXAMINATION: ECOG PERFORMANCE STATUS: 0 - Asymptomatic  BP 102/66 (BP Location: Left Arm, Patient Position: Sitting)   Pulse 74   Temp (!) 97.4 F (36.3 C) (Tympanic)   Resp 18   Wt 232 lb (105.2 kg)   SpO2 99%   BMI 35.28 kg/m   Filed Weights   01/16/23 1005  Weight: 232 lb (105.2 kg)     Physical Exam HENT:     Head: Normocephalic and atraumatic.     Mouth/Throat:     Pharynx: No oropharyngeal exudate.  Eyes:     Pupils: Pupils are equal, round, and reactive to light.  Cardiovascular:     Rate and Rhythm: Normal rate and regular rhythm.  Pulmonary:     Effort: No respiratory distress.     Breath sounds: No wheezing.     Comments: Bilateral basilar crackles Abdominal:     General: Bowel sounds are normal. There is no distension.     Palpations: Abdomen is soft. There is no mass.     Tenderness: There is no abdominal tenderness. There is no guarding or rebound.  Musculoskeletal:        General: No tenderness. Normal range of motion.     Cervical back: Normal range of motion and neck supple.  Skin:    General: Skin is warm.  Neurological:     Mental Status: She is alert and oriented to person, place, and time.  Psychiatric:        Mood and Affect: Affect normal.    LABORATORY DATA:  I have reviewed the data as listed    Component Value Date/Time   NA 139 01/16/2023 0950   NA 145 (H) 09/08/2019 1359   NA 142 09/14/2013 1127   K 3.7 01/16/2023 0950   K 3.5 09/14/2013 1127   CL 103 01/16/2023 0950   CL 110 (H) 09/14/2013 1127   CO2 29 01/16/2023 0950   CO2 29 09/14/2013 1127   GLUCOSE 103 (H) 01/16/2023 0950   GLUCOSE 106 (H) 09/14/2013 1127   BUN 15  01/16/2023  0950   BUN 15 09/08/2019 1359   BUN 8 09/14/2013 1127   CREATININE 0.74 01/16/2023 0950   CREATININE 0.71 09/14/2013 1127   CREATININE 0.68 04/01/2013 1550   CALCIUM 8.8 (L) 01/16/2023 0950   CALCIUM 8.6 09/14/2013 1127   PROT 6.4 (L) 01/16/2023 0950   PROT 7.6 09/14/2013 1127   ALBUMIN 3.6 01/16/2023 0950   ALBUMIN 3.2 (L) 09/14/2013 1127   AST 21 01/16/2023 0950   ALT 13 01/16/2023 0950   ALT 19 09/14/2013 1127   ALKPHOS 67 01/16/2023 0950   ALKPHOS 76 09/14/2013 1127   BILITOT 0.5 01/16/2023 0950   GFRNONAA >60 01/16/2023 0950   GFRNONAA >60 09/14/2013 1127   GFRAA >60 01/20/2020 1303   GFRAA >60 09/14/2013 1127    No results found for: "SPEP", "UPEP"  Lab Results  Component Value Date   WBC 8.4 01/16/2023   NEUTROABS 6.3 01/16/2023   HGB 12.0 01/16/2023   HCT 39.0 01/16/2023   MCV 96.3 01/16/2023   PLT 193 01/16/2023      Chemistry      Component Value Date/Time   NA 139 01/16/2023 0950   NA 145 (H) 09/08/2019 1359   NA 142 09/14/2013 1127   K 3.7 01/16/2023 0950   K 3.5 09/14/2013 1127   CL 103 01/16/2023 0950   CL 110 (H) 09/14/2013 1127   CO2 29 01/16/2023 0950   CO2 29 09/14/2013 1127   BUN 15 01/16/2023 0950   BUN 15 09/08/2019 1359   BUN 8 09/14/2013 1127   CREATININE 0.74 01/16/2023 0950   CREATININE 0.71 09/14/2013 1127   CREATININE 0.68 04/01/2013 1550      Component Value Date/Time   CALCIUM 8.8 (L) 01/16/2023 0950   CALCIUM 8.6 09/14/2013 1127   ALKPHOS 67 01/16/2023 0950   ALKPHOS 76 09/14/2013 1127   AST 21 01/16/2023 0950   ALT 13 01/16/2023 0950   ALT 19 09/14/2013 1127   BILITOT 0.5 01/16/2023 0950       RADIOGRAPHIC STUDIES: I have personally reviewed the radiological images as listed and agreed with the findings in the report. No results found.   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 No findings for recurrent lymphoma.     #Currently on Calequence 100 mg twice a day [ AUG 1st, 2022].  Tolerating well. Stable. Monitor for now. Will plan imaging in next 3-4 months.   # March 31st, 2023- right upper lobe pulmonary artery branches  consistent with pulmonary embolism- x 1 eliquis [1 year- until June 2024]-  stable.   # Immunocompromised state: Hx Multifocal pneumonia-[April 2023]-Given the multiple pneumonias/high risk of death-recommended continuation of further rituximab. MARCH 2024- IgG- 800.  Monitor for now. Stable.  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 Continue lasix; refilled. stable.   #Chronic respiratory failure -2 L home O2 -NICMP/pulmonary hypertension/chronic bilateral lung scarring-followed by pulmonary [Dr.Meier.[place]]; MAY 2024- HRCT-  Pulmonary parenchymal pattern of fibrosis, with interval resolution of previously seen superimposed ground-glass- consistent with UIP.  Awaiting appt in sep, 2024  Dr.Dgyali re: pul HTN/ILD.   # Left shoulder pain/ Chronic arthritis-on meloxicam as needed- stable.  #  Vaccinations : s/p flu shot- today; discussed r: COVID booster/RSV; ? Pneumonia vaccination [check with PCP]  #Incidental findings on Imaging  CT , 2024:  Enlarged pulmonic trunk, indicative of pulmonary arterial hypertension. Aortic atherosclerosis reviewed/discussed/counseled the patient.   # DISPOSITION:  # follow up-MD in 2 months -/friday  MD ;labs- cbc/cmp/ldh/BNP; quantitative immunoglobulin]-vit D 25-OH.Dr.B   No orders of the defined types were placed in this encounter.    All questions were answered. The patient knows to call the clinic with any problems, questions or concerns.      Earna Coder, MD 01/16/2023 10:42 AM

## 2023-01-16 NOTE — Assessment & Plan Note (Addendum)
#  2016-2017- Mantle cell lymphoma recurrent biopsy-proven [July 2022]; Currently on Calquence [ DISCONTINUED  Rituximab-given the multifocal pneumonia]-  PET scan April 11th, 2024- Stable PET-CT. No findings for recurrent lymphoma.     #Currently on Calequence 100 mg twice a day [ AUG 1st, 2022].  Tolerating well. Stable. Monitor for now. Will plan imaging in next 3-4 months.   # March 31st, 2023- right upper lobe pulmonary artery branches  consistent with pulmonary embolism- x 1 eliquis [1 year- until June 2024]-  stable.   # Immunocompromised state: Hx Multifocal pneumonia-[April 2023]-Given the multiple pneumonias/high risk of death-recommended continuation of further rituximab. MARCH 2024- IgG- 800.  Monitor for now. Stable.  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 Continue lasix; refilled. stable.   #Chronic respiratory failure -2 L home O2 -NICMP/pulmonary hypertension/chronic bilateral lung scarring-followed by pulmonary [Dr.Meier.[place]]; MAY 2024- HRCT-  Pulmonary parenchymal pattern of fibrosis, with interval resolution of previously seen superimposed ground-glass- consistent with UIP.  Awaiting appt in sep, 2024  Dr.Dgyali re: pul HTN/ILD.   # Left shoulder pain/ Chronic arthritis-on meloxicam as needed- stable.  #Vaccinations : s/p flu shot- today; discussed r: COVID booster/RSV; ? Pneumonia vaccination [check with PCP]  # DISPOSITION:  # follow up-MD in 2 months -/friday  MD ;labs- cbc/cmp/ldh/BNP; quantitative immunoglobulin]-vit D 25-OH.Dr.B

## 2023-01-21 LAB — IMMUNOGLOBULINS A/E/G/M, SERUM
IgA: 83 mg/dL (ref 64–422)
IgE (Immunoglobulin E), Serum: <2 [IU]/mL — ABNORMAL LOW (ref 6–495)
IgG (Immunoglobin G), Serum: 812 mg/dL (ref 586–1602)
IgM (Immunoglobulin M), Srm: 9 mg/dL — ABNORMAL LOW (ref 26–217)

## 2023-01-22 DIAGNOSIS — J9601 Acute respiratory failure with hypoxia: Secondary | ICD-10-CM | POA: Diagnosis not present

## 2023-01-22 DIAGNOSIS — I2699 Other pulmonary embolism without acute cor pulmonale: Secondary | ICD-10-CM | POA: Diagnosis not present

## 2023-01-22 DIAGNOSIS — G4733 Obstructive sleep apnea (adult) (pediatric): Secondary | ICD-10-CM | POA: Diagnosis not present

## 2023-01-23 ENCOUNTER — Ambulatory Visit: Payer: 59 | Admitting: Internal Medicine

## 2023-02-03 ENCOUNTER — Other Ambulatory Visit: Payer: Self-pay | Admitting: Medical Oncology

## 2023-02-03 ENCOUNTER — Other Ambulatory Visit (HOSPITAL_COMMUNITY): Payer: Self-pay

## 2023-02-03 DIAGNOSIS — C8311 Mantle cell lymphoma, lymph nodes of head, face, and neck: Secondary | ICD-10-CM

## 2023-02-06 ENCOUNTER — Other Ambulatory Visit: Payer: Self-pay

## 2023-02-06 ENCOUNTER — Other Ambulatory Visit (HOSPITAL_COMMUNITY): Payer: Self-pay

## 2023-02-06 ENCOUNTER — Other Ambulatory Visit: Payer: Self-pay | Admitting: Medical Oncology

## 2023-02-06 ENCOUNTER — Encounter: Payer: Self-pay | Admitting: Internal Medicine

## 2023-02-06 DIAGNOSIS — C8311 Mantle cell lymphoma, lymph nodes of head, face, and neck: Secondary | ICD-10-CM

## 2023-02-06 MED ORDER — CALQUENCE 100 MG PO TABS
100.0000 mg | ORAL_TABLET | Freq: Two times a day (BID) | ORAL | 2 refills | Status: DC
Start: 2023-02-06 — End: 2023-04-16
  Filled 2023-02-06: qty 60, 30d supply, fill #0
  Filled 2023-02-27: qty 60, 30d supply, fill #1
  Filled 2023-03-24: qty 60, 30d supply, fill #2

## 2023-02-16 ENCOUNTER — Encounter: Payer: Self-pay | Admitting: Student in an Organized Health Care Education/Training Program

## 2023-02-16 ENCOUNTER — Ambulatory Visit: Payer: 59 | Admitting: Student in an Organized Health Care Education/Training Program

## 2023-02-16 VITALS — BP 110/70 | HR 62 | Temp 98.0°F | Ht 68.0 in | Wt 229.0 lb

## 2023-02-16 DIAGNOSIS — M069 Rheumatoid arthritis, unspecified: Secondary | ICD-10-CM | POA: Diagnosis not present

## 2023-02-16 DIAGNOSIS — J9611 Chronic respiratory failure with hypoxia: Secondary | ICD-10-CM

## 2023-02-16 NOTE — Progress Notes (Unsigned)
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 I have reviewed her previous imaging studies that showed underlying UIP pattern with subpleural reticulation and honeycombing. 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 CT imaging at that time was notable for the underlying UIP with additional ground glass opacities suggesting active inflammation. B-Cell depletion with anti-CD-20 monoclonal antibodies has been associated with persistent COVID infections with multiple underlying patterns of lung damage (including organizing pneumonia, diffuse alveolar damage, etc...). She did not have any tissue diagnosis and the underlying pattern is difficult to ascertain.  Her most recent HRCT was noted for a UIP pattern, with some progression in fibrosis compared to previous imaging dating back to 2018. The ground glass opacities are improved. She carries a history of rheumatoid arthritis, with no serology to confirm this.   Today, I will obtain a full rheumatological workup to include ANA panel, Myomarker panel, RF, and anti-CCP antibodies. Will also obtain a repeat pulmonary function test and follow up with the patient to decide on next steps regarding therapy. Options for therapy would include anti-fibrotic therapy for IPF vs disease modifying agents for rheumatoid arthritis should her antibodies return positive. The fact that she had a persistent COVID-19 infection with immune suppression complicates our approach.  - ANA 12 Plus Profile (RDL) -  Rheumatoid factor - Anti-CCP Ab, IgG + IgA (RDL) - MyoMarker 3 Plus Profile (RDL) - Pulmonary Function Test [facility] Only; Future - Continue 3L per [place] with exertion and nocturnally - continue nocturnal CPAP - Continue Apixaban   Return in about 3 months (around 05/18/2023).  I spent 55 minutes caring for this patient today, including preparing to see the patient, obtaining a medical history , reviewing a separately obtained history, performing a medically appropriate examination and/or evaluation, counseling and educating the patient/family/caregiver, ordering medications, tests, or procedures, documenting clinical information in the electronic health record, and independently interpreting results (not separately reported/billed) and communicating results to the patient/family/caregiver  Raechel Chute, MD [facility]   End of visit medications:  No orders of the defined types were placed in this encounter.    Current Outpatient Medications:    acalabrutinib maleate (CALQUENCE) 100 MG tablet, Take 1 tablet (100 mg total) by mouth 2 (two) times daily., Disp: 60 tablet, Rfl: 2   acetaminophen (TYLENOL) 500 MG tablet, Take 1,000 mg by mouth every 6 (six) hours as needed for mild pain. , Disp: , Rfl:    albuterol (VENTOLIN HFA) 108 (90 Base) MCG/ACT inhaler, 2 puffs inhaled orally every 4 to 6 hours as needed, not to exceed 12 puffs in 24 hours, Disp: 18 each, Rfl: 1   apixaban (ELIQUIS) 5 MG TABS tablet, TAKE 1 TABLET BY MOUTH TWICE A DAY, Disp: 60 tablet, Rfl: 5   benzonatate (TESSALON) 100 MG capsule, TAKE 1 CAPSULE BY MOUTH 2 TIMES DAILY AS NEEDED FOR COUGH., Disp: 40 capsule, Rfl: 0   carvedilol (COREG) 3.125 MG tablet, TAKE 1 TABLET BY MOUTH TWICE A DAY, Disp: 180 tablet, Rfl: 1   feeding supplement (ENSURE ENLIVE / ENSURE PLUS) LIQD, Take 237 mLs by mouth  3 (three) times daily between meals., Disp: 237 mL, Rfl: 12   fluticasone (FLONASE) 50 MCG/ACT nasal spray, PLACE  2 SPRAYS INTO BOTH NOSTRILS DAILY AS NEEDED FOR ALLERGIES., Disp: 48 mL, Rfl: 1   furosemide (LASIX) 40 MG tablet, TAKE 1 TABLET BY MOUTH EVERY DAY, Disp: 90 tablet, Rfl: 1   ipratropium-albuterol (DUONEB) 0.5-2.5 (3) MG/3ML SOLN, Take 3 mLs by nebulization every 6 (six) hours as needed., Disp: 1080 mL, Rfl: 0   KLOR-CON M10 10 MEQ tablet, TAKE 1 TABLET BY MOUTH EVERY DAY, Disp: 90 tablet, Rfl: 1   losartan (COZAAR) 25 MG tablet, Take 0.5 tablets (12.5 mg total) by mouth daily., Disp: 45 tablet, Rfl: 3   MEGARED OMEGA-3 KRILL OIL PO, Take 1 tablet by mouth daily., Disp: , Rfl:    Multiple Vitamins-Minerals (CENTRUM SILVER 50+WOMEN) TABS, Take 1 tablet by mouth daily., Disp: , Rfl:    ondansetron (ZOFRAN) 8 MG tablet, ONE PILL EVERY 8 HOURS AS NEEDED FOR NAUSEA/VOMITTING., Disp: 40 tablet, Rfl: 1   OXYGEN, Inhale into the lungs. 2 Liters 24/7, Disp: , Rfl:    rosuvastatin (CRESTOR) 5 MG tablet, Take 1 tablet (5 mg total) by mouth daily., Disp: 90 tablet, Rfl: 3 No current facility-administered medications for this visit.  Facility-Administered Medications Ordered in Other Visits:    heparin lock flush 100 unit/mL, 500 Units, Intravenous, Once, Brahmanday, Govinda R, MD   sodium chloride flush (NS) 0.9 % injection 10 mL, 10 mL, Intravenous, Once, Brahmanday, Govinda R, MD   sodium chloride flush (NS) 0.9 % injection 10 mL, 10 mL, Intravenous, Once, Earna Coder, MD   Subjective:   PATIENT ID: Michelle [name] GENDER: female DOB: 04/09/1941, MRN: [id number]  Chief Complaint  Patient presents with   Follow-up    Patient reports cough, shortness of breath and wheezing with exertion. Oxygen 3L. Has not been using CPAP due to wearing oxygen.     HPI  Patient is a pleasant 82 year old female with a history of multiple medical problems presenting to clinic today for follow up. She was previously followed by Dr. Thora Lance, last seen June of 2023. Dr. Thora Lance has since left our group and she  would like to be seen locally in [place].  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 She was admitted with acute hypoxic respiratory failure to [facility] in March of 2023. She was found to have COVID with persistence of viral detection. She was treated with steroids, antibiotics and molnupravir. At that point, she was treated with Rituximab. She was also found to have an acute PE treated with Apixaban. She was discharged home on oxygen therapy with a prednisone taper and diuretics. She was re-admitted in May of 2023 again with worsening respiratory failure and diagnosed with multi-focal pneumonia. Patient again treated with antibiotics and steroids. COVID was still positive during said admission in May of 2023. Imaging during said admission noted underlying fibrotic lung disease (UIP pattern) with super imposed ground glass opacities. She was then seen by Dr. Lilian Kapur in clinic and prescribed a prolonged prednisone taper. She was ordered a repeat HRCT and is presenting today for follow up.  Patient is followed closely by oncology for Mantle cell lymphoma. She follows with Dr. Donneta Romberg. She was last seen by him on 01/16/2023. Patient is maintained on Acalabrutinib. Rituximab had been discontinued for over a year now. She also follows with Dr.  End from cardiology regarding NICM (EF as low as 30% in 2021, recovered to 60% in 2023) and is maintained on goal directed medical therapy. 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  Patient reports that her symptoms have been stable. She has ongoing exertional dyspnea and she uses oxygen with activity and nocturnally. She hasn't felt a change to symptoms over the past year. She has not had any increase in cough, chest pain, chest tightness, or oxygen requirements.  Past medical history notable for NICM, HFpEF, OSA on CPAP, RA,  GERD, and Stage IV mantle cell lymphoma. She reports being diagnosed with RA by Dr. Gavin Potters many years ago. There is no serology for RA evident in our records. She has not been seen by rheumatology for over 5 years.  Ancillary information including prior medications, full medical/surgical/family/social histories, and PFTs (when available) are listed below and have been reviewed.   Review of Systems  Constitutional:  Negative for chills, fever, malaise/fatigue and weight loss.  Respiratory:  Positive for shortness of breath. Negative for cough, hemoptysis, sputum production and wheezing.   Cardiovascular:  Negative for chest pain and palpitations.     Objective:   Vitals:   02/16/23 1007  BP: 110/70  Pulse: 62  Temp: 98 F (36.7 C)  TempSrc: Temporal  SpO2: 98%  Weight: 229 lb (103.9 kg)  Height: 5\' 8"  (1.727 m)   98% on RA  BMI Readings from Last 3 Encounters:  02/16/23 34.82 kg/m  01/16/23 35.28 kg/m  12/03/22 37.40 kg/m   Wt Readings from Last 3 Encounters:  02/16/23 229 lb (103.9 kg)  01/16/23 232 lb (105.2 kg)  12/03/22 246 lb (111.6 kg)    Physical Exam Constitutional:      Appearance: Normal appearance. She is obese.  Cardiovascular:     Rate and Rhythm: Normal rate and regular rhythm.     Pulses: Normal pulses.     Heart sounds: Normal heart sounds.  Pulmonary:     Effort: Pulmonary effort is normal.     Breath sounds: No wheezing or rales.  Abdominal:     General: There is distension.     Palpations: Abdomen is soft.  Neurological:     General: No focal deficit present.     Mental Status: She is alert and oriented to person, place, and time. Mental status is at baseline.       Ancillary Information    Past Medical History:  Diagnosis Date   Arthritis    Collagen vascular disease (HCC)    GERD (gastroesophageal reflux disease)    Headache(784.0)    HFrEF (heart failure with reduced ejection fraction) (HCC)    Nonischemic cardiomyopathy,  LVEF as low as 30-35% in 08/2019   History of methotrexate therapy    Hyperlipidemia    hx   Lymphadenopathy of head and neck 01/2015   see on Thyroid ultrasound   Lymphoma, mantle cell (HCC) 06/01/2015   bx of lymph node in right breast/Stage IV Mantle Cell Lymphoma   Multifocal pneumonia 10/05/2021   Personal history of chemotherapy    Pulmonary embolism (HCC) 10/05/2021   acute   Respiratory failure (HCC) 10/05/2021   acute   Rheumatoid arthritis (HCC)      Family History  Problem Relation Age of Onset   Diabetes Mother    Cholelithiasis Mother    Hypertension Sister    Diabetes Sister    Heart murmur Sister    Arthritis Brother    Breast  cancer Neg Hx      Past Surgical History:  Procedure Laterality Date   BREAST BIOPSY Left 11/07/2020   u/s bx-hydromark #3 "coil"-path pending   CARDIAC CATHETERIZATION  09/2004   [facility]; EF 60%   CARDIAC CATHETERIZATION  08/2004   [facility]   IR FLUORO GUIDED NEEDLE PLC ASPIRATION/INJECTION LOC  06/18/2018   PERIPHERAL VASCULAR CATHETERIZATION N/A 07/04/2015   Procedure: Shelda Pal Cath Insertion;  Surgeon: Annice Needy, MD;  Location: [facility] INVASIVE CV LAB;  Service: Cardiovascular;  Laterality: N/A;   PORTA CATH REMOVAL N/A 06/23/2018   Procedure: PORTA CATH REMOVAL;  Surgeon: Annice Needy, MD;  Location: [facility] INVASIVE CV LAB;  Service: Cardiovascular;  Laterality: N/A;   RIGHT/LEFT HEART CATH AND CORONARY ANGIOGRAPHY Bilateral 09/20/2019   Procedure: RIGHT/LEFT HEART CATH AND CORONARY ANGIOGRAPHY;  Surgeon: Yvonne Kendall, MD;  Location: [facility] INVASIVE CV LAB;  Service: Cardiovascular;  Laterality: Bilateral;    Social History   Socioeconomic History   Marital status: Married    Spouse name: Not on file   Number of children: Not on file   Years of education: Not on file   Highest education level: Not on file  Occupational History   Not on file  Tobacco Use   Smoking status: Never   Smokeless tobacco: Never  Vaping Use   Vaping status:  Never Used  Substance and Sexual Activity   Alcohol use: No   Drug use: No   Sexual activity: Never  Other Topics Concern   Not on file  Social History Narrative   ** Merged History Encounter **       Lives in [place]. Works as Clinical biochemist.   Social Determinants of Health   Financial Resource Strain: Low Risk  (12/03/2022)   Overall Financial Resource Strain (CARDIA)    Difficulty of Paying Living Expenses: Not hard at all  Food Insecurity: No Food Insecurity (12/03/2022)   Hunger Vital Sign    Worried About Running Out of Food in the Last Year: Never true    Ran Out of Food in the Last Year: Never true  Transportation Needs: No Transportation Needs (12/03/2022)   PRAPARE - Administrator, Civil Service (Medical): No    Lack of Transportation (Non-Medical): No  Physical Activity: Inactive (12/03/2022)   Exercise Vital Sign    Days of Exercise per Week: 0 days    Minutes of Exercise per Session: 0 min  Stress: No Stress Concern Present (12/03/2022)   [employer] of Occupational Health - Occupational Stress Questionnaire    Feeling of Stress : Not at all  Social Connections: Moderately Integrated (12/03/2022)   Social Connection and Isolation Panel [NHANES]    Frequency of Communication with Friends and Family: More than three times a week    Frequency of Social Gatherings with Friends and Family: More than three times a week    Attends Religious Services: More than 4 times per year    Active Member of [employer] or Organizations: Yes    Attends Banker Meetings: More than 4 times per year    Marital Status: Widowed  Intimate Partner Violence: Not At Risk (12/03/2022)   Humiliation, Afraid, Rape, and Kick questionnaire    Fear of Current or Ex-Partner: No    Emotionally Abused: No    Physically Abused: No    Sexually Abused: No     No Known Allergies   CBC    Component Value Date/Time   WBC 8.4 01/16/2023  0950   WBC 8.4 08/01/2022 1006   RBC 4.05  01/16/2023 0950   HGB 12.0 01/16/2023 0950   HGB 13.4 09/08/2019 1359   HCT 39.0 01/16/2023 0950   HCT 40.9 09/08/2019 1359   PLT 193 01/16/2023 0950   PLT 194 09/08/2019 1359   MCV 96.3 01/16/2023 0950   MCV 90 09/08/2019 1359   MCV 89 09/14/2013 1127   MCH 29.6 01/16/2023 0950   MCHC 30.8 01/16/2023 0950   RDW 13.8 01/16/2023 0950   RDW 13.9 09/08/2019 1359   RDW 15.1 (H) 09/14/2013 1127   LYMPHSABS 1.6 01/16/2023 0950   LYMPHSABS 1.3 09/08/2019 1359   MONOABS 0.4 01/16/2023 0950   EOSABS 0.1 01/16/2023 0950   EOSABS 0.1 09/08/2019 1359   BASOSABS 0.0 01/16/2023 0950   BASOSABS 0.0 09/08/2019 1359    Pulmonary Functions Testing Results:    Latest Ref Rng & Units 11/19/2021    1:33 PM  PFT Results  FVC-Pre L 1.24   FVC-Predicted Pre % 50   FVC-Post L 1.27   FVC-Predicted Post % 52   Pre FEV1/FVC % % 82   Post FEV1/FCV % % 91   FEV1-Pre L 1.02   FEV1-Predicted Pre % 53   FEV1-Post L 1.15   TLC L 3.08   TLC % Predicted % 55   RV % Predicted % 61     Outpatient Medications Prior to Visit  Medication Sig Dispense Refill   acalabrutinib maleate (CALQUENCE) 100 MG tablet Take 1 tablet (100 mg total) by mouth 2 (two) times daily. 60 tablet 2   acetaminophen (TYLENOL) 500 MG tablet Take 1,000 mg by mouth every 6 (six) hours as needed for mild pain.      albuterol (VENTOLIN HFA) 108 (90 Base) MCG/ACT inhaler 2 puffs inhaled orally every 4 to 6 hours as needed, not to exceed 12 puffs in 24 hours 18 each 1   apixaban (ELIQUIS) 5 MG TABS tablet TAKE 1 TABLET BY MOUTH TWICE A DAY 60 tablet 5   benzonatate (TESSALON) 100 MG capsule TAKE 1 CAPSULE BY MOUTH 2 TIMES DAILY AS NEEDED FOR COUGH. 40 capsule 0   carvedilol (COREG) 3.125 MG tablet TAKE 1 TABLET BY MOUTH TWICE A DAY 180 tablet 1   feeding supplement (ENSURE ENLIVE / ENSURE PLUS) LIQD Take 237 mLs by mouth 3 (three) times daily between meals. 237 mL 12   fluticasone (FLONASE) 50 MCG/ACT nasal spray PLACE 2 SPRAYS INTO BOTH  NOSTRILS DAILY AS NEEDED FOR ALLERGIES. 48 mL 1   furosemide (LASIX) 40 MG tablet TAKE 1 TABLET BY MOUTH EVERY DAY 90 tablet 1   ipratropium-albuterol (DUONEB) 0.5-2.5 (3) MG/3ML SOLN Take 3 mLs by nebulization every 6 (six) hours as needed. 1080 mL 0   KLOR-CON M10 10 MEQ tablet TAKE 1 TABLET BY MOUTH EVERY DAY 90 tablet 1   losartan (COZAAR) 25 MG tablet Take 0.5 tablets (12.5 mg total) by mouth daily. 45 tablet 3   MEGARED OMEGA-3 KRILL OIL PO Take 1 tablet by mouth daily.     Multiple Vitamins-Minerals (CENTRUM SILVER 50+WOMEN) TABS Take 1 tablet by mouth daily.     ondansetron (ZOFRAN) 8 MG tablet ONE PILL EVERY 8 HOURS AS NEEDED FOR NAUSEA/VOMITTING. 40 tablet 1   OXYGEN Inhale into the lungs. 2 Liters 24/7     rosuvastatin (CRESTOR) 5 MG tablet Take 1 tablet (5 mg total) by mouth daily. 90 tablet 3   Facility-Administered Medications Prior to Visit  Medication Dose  Route Frequency Provider Last Rate Last Admin   heparin lock flush 100 unit/mL  500 Units Intravenous Once Louretta Shorten R, MD       sodium chloride flush (NS) 0.9 % injection 10 mL  10 mL Intravenous Once Louretta Shorten R, MD       sodium chloride flush (NS) 0.9 % injection 10 mL  10 mL Intravenous Once Earna Coder, MD

## 2023-02-16 NOTE — Patient Instructions (Signed)
Today, I ordered blood work. You can get them draw at your preferred [facility] draw station. The nearest one to [facility] is at nearby [facility] ([address], [place], [place] [address]). 

## 2023-02-19 DIAGNOSIS — J9611 Chronic respiratory failure with hypoxia: Secondary | ICD-10-CM | POA: Diagnosis not present

## 2023-02-22 DIAGNOSIS — J9601 Acute respiratory failure with hypoxia: Secondary | ICD-10-CM | POA: Diagnosis not present

## 2023-02-22 DIAGNOSIS — I2699 Other pulmonary embolism without acute cor pulmonale: Secondary | ICD-10-CM | POA: Diagnosis not present

## 2023-02-22 DIAGNOSIS — G4733 Obstructive sleep apnea (adult) (pediatric): Secondary | ICD-10-CM | POA: Diagnosis not present

## 2023-02-27 ENCOUNTER — Other Ambulatory Visit: Payer: Self-pay

## 2023-02-27 NOTE — Progress Notes (Signed)
Specialty Pharmacy Ongoing Clinical Assessment Note  Michelle [name] is a 82 y.o. female who is being followed by the specialty pharmacy service for RxSp Oncology   Review of patient's specialty medication(s) Acalabrutinib Maleate  occurred today.   Patient has missed 0  doses in the last 4 weeks.   Patient did not have any additional questions or concerns.   Therapeutic benefit summary: Patient is achieving benefit   Adverse events/side effects summary: No adverse events/side effects  Patient Goal is to slow disease progression. Pt is on on track. Patient will maintain adherence.   Patient's therapy is appropriate to : Continue      Follow up:  6 months

## 2023-02-27 NOTE — Progress Notes (Signed)
Specialty Pharmacy Refill Coordination Note  Michelle [name] is a 82 y.o. female contacted today regarding refills of specialty medication(s) Acalabrutinib Maleate .  Patient requested Delivery  on 03/04/23  to verified address [address]   Medication will be filled on 03/03/23.

## 2023-03-11 LAB — ANA 12 PLUS PROFILE, POSITIVE
Anti-Cardiolipin Ab, IgA (RDL): <12 [APL'U]/mL (ref <12)
Anti-Cardiolipin Ab, IgG (RDL): <15 [GPL'U]/mL (ref <15)
Anti-Cardiolipin Ab, IgM (RDL): <13 [MPL'U]/mL (ref <13)
Anti-Centromere Ab (RDL): <1:40 {titer}
Anti-Chromatin Ab, IgG (RDL): <20 U (ref <20)
Anti-La (SS-B) Ab (RDL): <20 U (ref <20)
Anti-Ro (SS-A) Ab (RDL): 47 U — ABNORMAL HIGH (ref <20)
Anti-Scl-70 Ab (RDL): <20 U (ref <20)
Anti-Sm Ab (RDL): <20 U (ref <20)
Anti-TPO Ab (RDL): <9 [IU]/mL (ref <9.0)
Anti-dsDNA Ab by Farr(RDL): <8 [IU]/mL (ref <8.0)
C3 Complement (RDL): 195 mg/dL — ABNORMAL HIGH (ref 82–167)
C4 Complement (RDL): 61 mg/dL — ABNORMAL HIGH (ref 14–44)
Homogeneous Pattern: 1:80 {titer} — ABNORMAL HIGH
Speckled Pattern: 1:80 {titer} — ABNORMAL HIGH

## 2023-03-11 LAB — MYOMARKER 3 PLUS PROFILE (RDL)
Anti-Jo-1 Ab (RDL): <20 U (ref <20)
Anti-MDA-5 Ab (CADM-140)(RDL): <20 U (ref <20)
Anti-NXP-2 (P140) Ab (RDL): <20 U (ref <20)
Anti-PM/Scl-100 Ab (RDL): <20 U (ref <20)
Anti-SAE1 Ab, IgG (RDL): <20 U (ref <20)
Anti-SS-A 52kD Ab, IgG (RDL): 29 U — ABNORMAL HIGH (ref <20)
Anti-TIF-1gamma Ab (RDL): <20 U (ref <20)
Anti-U1 RNP Ab (RDL): <20 U (ref <20)
Anti-U2 RNP Ab (RDL): NEGATIVE
Anti-U3 RNP (Fibrillarin)(RDL): NEGATIVE

## 2023-03-11 LAB — ANA 12 PLUS PROFILE (RDL): Anti-Nuclear Ab by IFA (RDL): POSITIVE — AB

## 2023-03-11 LAB — ANTI-CCP AB, IGG + IGA (RDL): Anti-CCP Ab, IgG + IgA (RDL): >250 U — ABNORMAL HIGH (ref <20)

## 2023-03-11 LAB — RHEUMATOID FACTOR: Rheumatoid fact SerPl-aCnc: <10 [IU]/mL (ref <14.0)

## 2023-03-20 ENCOUNTER — Encounter: Payer: Self-pay | Admitting: Nurse Practitioner

## 2023-03-20 ENCOUNTER — Inpatient Hospital Stay: Payer: 59 | Attending: Internal Medicine

## 2023-03-20 ENCOUNTER — Inpatient Hospital Stay (HOSPITAL_BASED_OUTPATIENT_CLINIC_OR_DEPARTMENT_OTHER): Payer: 59 | Admitting: Nurse Practitioner

## 2023-03-20 VITALS — BP 101/57 | HR 65 | Temp 97.1°F | Wt 228.0 lb

## 2023-03-20 DIAGNOSIS — Z79899 Other long term (current) drug therapy: Secondary | ICD-10-CM | POA: Diagnosis not present

## 2023-03-20 DIAGNOSIS — C8311 Mantle cell lymphoma, lymph nodes of head, face, and neck: Secondary | ICD-10-CM | POA: Insufficient documentation

## 2023-03-20 DIAGNOSIS — J849 Interstitial pulmonary disease, unspecified: Secondary | ICD-10-CM | POA: Insufficient documentation

## 2023-03-20 DIAGNOSIS — I428 Other cardiomyopathies: Secondary | ICD-10-CM | POA: Diagnosis not present

## 2023-03-20 DIAGNOSIS — J9610 Chronic respiratory failure, unspecified whether with hypoxia or hypercapnia: Secondary | ICD-10-CM | POA: Insufficient documentation

## 2023-03-20 DIAGNOSIS — I255 Ischemic cardiomyopathy: Secondary | ICD-10-CM | POA: Diagnosis not present

## 2023-03-20 DIAGNOSIS — I272 Pulmonary hypertension, unspecified: Secondary | ICD-10-CM | POA: Diagnosis not present

## 2023-03-20 DIAGNOSIS — M069 Rheumatoid arthritis, unspecified: Secondary | ICD-10-CM | POA: Insufficient documentation

## 2023-03-20 DIAGNOSIS — Z7901 Long term (current) use of anticoagulants: Secondary | ICD-10-CM | POA: Insufficient documentation

## 2023-03-20 DIAGNOSIS — Z86711 Personal history of pulmonary embolism: Secondary | ICD-10-CM | POA: Insufficient documentation

## 2023-03-20 DIAGNOSIS — Z9221 Personal history of antineoplastic chemotherapy: Secondary | ICD-10-CM | POA: Diagnosis not present

## 2023-03-20 LAB — CMP (CANCER CENTER ONLY)
ALT: 13 U/L (ref 0–44)
AST: 21 U/L (ref 15–41)
Albumin: 3.6 g/dL (ref 3.5–5.0)
Alkaline Phosphatase: 68 U/L (ref 38–126)
Anion gap: 7 (ref 5–15)
BUN: 16 mg/dL (ref 8–23)
CO2: 28 mmol/L (ref 22–32)
Calcium: 9 mg/dL (ref 8.9–10.3)
Chloride: 102 mmol/L (ref 98–111)
Creatinine: 0.83 mg/dL (ref 0.44–1.00)
GFR, Estimated: >60 mL/min (ref >60)
Glucose, Bld: 117 mg/dL — ABNORMAL HIGH (ref 70–99)
Potassium: 4.2 mmol/L (ref 3.5–5.1)
Sodium: 137 mmol/L (ref 135–145)
Total Bilirubin: 0.7 mg/dL (ref 0.3–1.2)
Total Protein: 6.5 g/dL (ref 6.5–8.1)

## 2023-03-20 LAB — CBC WITH DIFFERENTIAL (CANCER CENTER ONLY)
Abs Immature Granulocytes: 0.05 10*3/uL (ref 0.00–0.07)
Basophils Absolute: 0 10*3/uL (ref 0.0–0.1)
Basophils Relative: 0 %
Eosinophils Absolute: 0.1 10*3/uL (ref 0.0–0.5)
Eosinophils Relative: 1 %
HCT: 39.9 % (ref 36.0–46.0)
Hemoglobin: 12.5 g/dL (ref 12.0–15.0)
Immature Granulocytes: 1 %
Lymphocytes Relative: 15 %
Lymphs Abs: 1.2 10*3/uL (ref 0.7–4.0)
MCH: 29.8 pg (ref 26.0–34.0)
MCHC: 31.3 g/dL (ref 30.0–36.0)
MCV: 95.2 fL (ref 80.0–100.0)
Monocytes Absolute: 0.3 10*3/uL (ref 0.1–1.0)
Monocytes Relative: 4 %
Neutro Abs: 6.4 10*3/uL (ref 1.7–7.7)
Neutrophils Relative %: 79 %
Platelet Count: 198 10*3/uL (ref 150–400)
RBC: 4.19 MIL/uL (ref 3.87–5.11)
RDW: 13.9 % (ref 11.5–15.5)
WBC Count: 8.1 10*3/uL (ref 4.0–10.5)
nRBC: 0 % (ref 0.0–0.2)

## 2023-03-20 LAB — LACTATE DEHYDROGENASE: LDH: 151 U/L (ref 98–192)

## 2023-03-20 LAB — VITAMIN D 25 HYDROXY (VIT D DEFICIENCY, FRACTURES): Vit D, 25-Hydroxy: 41.11 ng/mL (ref 30–100)

## 2023-03-20 LAB — BRAIN NATRIURETIC PEPTIDE: B Natriuretic Peptide: 74.1 pg/mL (ref 0.0–100.0)

## 2023-03-20 NOTE — Progress Notes (Signed)
[facility] OFFICE PROGRESS NOTE  Patient Care Team: Allegra Grana, FNP as PCP - General (Family Medicine) End, Cristal Deer, MD as PCP - Cardiology (Cardiology) Duke Salvia, MD as PCP - Electrophysiology (Cardiology) Shelia Media, MD (Internal Medicine) Earna Coder, MD as Consulting Physician (Internal Medicine)   Cancer Staging  No matching staging information was found for the patient.  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 Feb 9th- START Benda-Ritux with neulasta; Prolonged neutropenia; DISCONT- Benda-Ritux;   # April 13 th 2017- START R-CHOP x1; severe/prolonged neutropenia; PET- CR; BMBx-Neg; Disc R-CHOP # June 2022- DIAGNOSIS: thickened cortex of 12 mm. An additional lymph node demonstrates a thickened cortex of 7 mm. A third lymph node demonstrates a mildly thickened cortex of 4 mm A. LYMPH NODE, LEFT AXILLA; ULTRASOUND-GUIDED BIOPSY:  - CD5+ MONOCLONAL B-CELL POPULATION; COMPATIBLE WITH INVOLVEMENT BY THE  PATIENT'S KNOWN MANTLE CELL LYMPHOMA.   #July 2022-second week-started ibrutinib 420 mg a dayx 1 week-stop because of severe rash.  Significant clinical response noted.  # # 26th MAY 2017- Start Rituxan q 70M Main OCT 12th 2017- PET NED.    # AUG 1st, 2022- start acalbrutinib. 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 Ambulating in a  wheel chair.   Michelle [name] 82 y.o.  female pleasant patient multiple medical problems-ischemic cardiomyopathy; pulm hypertension; Hx of PE on eliquis and above history of recurrent mantle cell lymphoma on Calquence; here for follow-up. She feels well. Admits to compliance with calquence. Continues oxygen as needed. No nausea diarrhea, vomiting. Denies unintentional weight loss, new lumps, bumps, fevers or chills. She has some acid reflux at times.    Review of Systems  Constitutional:  Negative for chills, diaphoresis, fever, malaise/fatigue and weight loss.  HENT:  Negative for nosebleeds and sore throat.   Eyes:  Negative for double vision.  Respiratory:  Negative for hemoptysis, sputum production and wheezing.   Cardiovascular:  Negative for chest pain, palpitations, orthopnea and leg swelling.  Gastrointestinal:  Positive for heartburn. Negative for abdominal pain, blood in stool, constipation, diarrhea, melena, nausea and vomiting.  Genitourinary:  Negative for dysuria, frequency, hematuria and urgency.  Musculoskeletal:  Positive for back pain and joint pain.  Skin:  Negative for itching and rash.  Neurological:  Negative for dizziness, tingling, focal weakness, weakness and headaches.  Endo/Heme/Allergies:  Does not bruise/bleed easily.  Psychiatric/Behavioral:  Negative for depression. The patient is not nervous/anxious and does not have insomnia.     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% in 08/2019   History of methotrexate therapy    Hyperlipidemia    hx  Lymphadenopathy of head and neck 01/2015   see on Thyroid ultrasound   Lymphoma, mantle cell (HCC) 06/01/2015   bx of lymph node in right breast/Stage IV Mantle Cell  Lymphoma   Multifocal pneumonia 10/05/2021   Personal history of chemotherapy    Pulmonary embolism (HCC) 10/05/2021   acute   Respiratory failure (HCC) 10/05/2021   acute   Rheumatoid arthritis (HCC)     PAST SURGICAL HISTORY :   Past Surgical History:  Procedure Laterality Date   BREAST BIOPSY Left 11/07/2020   u/s bx-hydromark #3 "coil"-path pending   CARDIAC CATHETERIZATION  09/2004   [facility]; EF 60%   CARDIAC CATHETERIZATION  08/2004   [facility]   IR FLUORO GUIDED NEEDLE PLC ASPIRATION/INJECTION LOC  06/18/2018   PERIPHERAL VASCULAR CATHETERIZATION N/A 07/04/2015   Procedure: Shelda Pal Cath Insertion;  Surgeon: Annice Needy, MD;  Location: [facility] INVASIVE CV LAB;  Service: Cardiovascular;  Laterality: N/A;   PORTA CATH REMOVAL N/A 06/23/2018   Procedure: PORTA CATH REMOVAL;  Surgeon: Annice Needy, MD;  Location: [facility] INVASIVE CV LAB;  Service: Cardiovascular;  Laterality: N/A;   RIGHT/LEFT HEART CATH AND CORONARY ANGIOGRAPHY Bilateral 09/20/2019   Procedure: RIGHT/LEFT HEART CATH AND CORONARY ANGIOGRAPHY;  Surgeon: Yvonne Kendall, MD;  Location: [facility] INVASIVE CV LAB;  Service: Cardiovascular;  Laterality: Bilateral;    FAMILY HISTORY :   Family History  Problem Relation Age of Onset   Diabetes Mother    Cholelithiasis Mother    Hypertension Sister    Diabetes Sister    Heart murmur Sister    Arthritis Brother    Breast cancer Neg Hx     SOCIAL HISTORY:   Social History   Tobacco Use   Smoking status: Never   Smokeless tobacco: Never  Vaping Use   Vaping status: Never Used  Substance Use Topics   Alcohol use: No   Drug use: No    ALLERGIES:  has No Known Allergies.  MEDICATIONS:  Current Outpatient Medications  Medication Sig Dispense Refill   acalabrutinib maleate (CALQUENCE) 100 MG tablet Take 1 tablet (100 mg total) by mouth 2 (two) times daily. 60 tablet 2   acetaminophen (TYLENOL) 500 MG tablet Take 1,000 mg by mouth every 6 (six) hours as needed for mild pain.       albuterol (VENTOLIN HFA) 108 (90 Base) MCG/ACT inhaler 2 puffs inhaled orally every 4 to 6 hours as needed, not to exceed 12 puffs in 24 hours 18 each 1   apixaban (ELIQUIS) 5 MG TABS tablet TAKE 1 TABLET BY MOUTH TWICE A DAY 60 tablet 5   benzonatate (TESSALON) 100 MG capsule TAKE 1 CAPSULE BY MOUTH 2 TIMES DAILY AS NEEDED FOR COUGH. 40 capsule 0   carvedilol (COREG) 3.125 MG tablet TAKE 1 TABLET BY MOUTH TWICE A DAY 180 tablet 1   feeding supplement (ENSURE ENLIVE / ENSURE PLUS) LIQD Take 237 mLs by mouth 3 (three) times daily between meals. 237 mL 12   fluticasone (FLONASE) 50 MCG/ACT nasal spray PLACE 2 SPRAYS INTO BOTH NOSTRILS DAILY AS NEEDED FOR ALLERGIES. 48 mL 1   furosemide (LASIX) 40 MG tablet TAKE 1 TABLET BY MOUTH EVERY DAY 90 tablet 1   ipratropium-albuterol (DUONEB) 0.5-2.5 (3) MG/3ML SOLN Take 3 mLs by nebulization every 6 (six) hours as needed. 1080 mL 0   KLOR-CON M10 10 MEQ tablet TAKE 1 TABLET BY MOUTH EVERY DAY 90 tablet 1   losartan (COZAAR) 25 MG tablet Take 0.5 tablets (12.5 mg  total) by mouth daily. 45 tablet 3   MEGARED OMEGA-3 KRILL OIL PO Take 1 tablet by mouth daily.     Multiple Vitamins-Minerals (CENTRUM SILVER 50+WOMEN) TABS Take 1 tablet by mouth daily.     ondansetron (ZOFRAN) 8 MG tablet ONE PILL EVERY 8 HOURS AS NEEDED FOR NAUSEA/VOMITTING. 40 tablet 1   OXYGEN Inhale into the lungs. 2 Liters 24/7     rosuvastatin (CRESTOR) 5 MG tablet Take 1 tablet (5 mg total) by mouth daily. 90 tablet 3   No current facility-administered medications for this visit.   Facility-Administered Medications Ordered in Other Visits  Medication Dose Route Frequency Provider Last Rate Last Admin   heparin lock flush 100 unit/mL  500 Units Intravenous Once Louretta Shorten R, MD       sodium chloride flush (NS) 0.9 % injection 10 mL  10 mL Intravenous Once Louretta Shorten R, MD       sodium chloride flush (NS) 0.9 % injection 10 mL  10 mL Intravenous Once Earna Coder, MD        PHYSICAL EXAMINATION: ECOG PERFORMANCE STATUS: 0 - Asymptomatic  BP (!) 101/57 (BP Location: Left Arm, Patient Position: Sitting)   Pulse 65   Temp (!) 97.1 F (36.2 C) (Tympanic)   Wt 228 lb (103.4 kg)   SpO2 100%   BMI 34.67 kg/m   Filed Weights   03/20/23 1034  Weight: 228 lb (103.4 kg)   Physical Exam Constitutional:      Appearance: She is not ill-appearing.  HENT:     Head: Normocephalic and atraumatic.  Cardiovascular:     Rate and Rhythm: Normal rate and regular rhythm.  Pulmonary:     Effort: No respiratory distress.     Breath sounds: No wheezing.     Comments: Bilateral basilar crackles Abdominal:     General: There is no distension.     Palpations: Abdomen is soft.     Tenderness: There is no abdominal tenderness. There is no guarding.  Musculoskeletal:        General: No tenderness.  Skin:    General: Skin is warm.     Coloration: Skin is not pale.  Neurological:     Mental Status: She is alert and oriented to person, place, and time.  Psychiatric:        Mood and Affect: Mood and affect normal.        Behavior: Behavior normal.    LABORATORY DATA:  I have reviewed the data as listed Lab Results  Component Value Date   WBC 8.1 03/20/2023   NEUTROABS 6.4 03/20/2023   HGB 12.5 03/20/2023   HCT 39.9 03/20/2023   MCV 95.2 03/20/2023   PLT 198 03/20/2023     Chemistry      Component Value Date/Time   NA 139 01/16/2023 0950   NA 145 (H) 09/08/2019 1359   NA 142 09/14/2013 1127   K 3.7 01/16/2023 0950   K 3.5 09/14/2013 1127   CL 103 01/16/2023 0950   CL 110 (H) 09/14/2013 1127   CO2 29 01/16/2023 0950   CO2 29 09/14/2013 1127   BUN 15 01/16/2023 0950   BUN 15 09/08/2019 1359   BUN 8 09/14/2013 1127   CREATININE 0.74 01/16/2023 0950   CREATININE 0.71 09/14/2013 1127   CREATININE 0.68 04/01/2013 1550      Component Value Date/Time   CALCIUM 8.8 (L) 01/16/2023 0950   CALCIUM 8.6 09/14/2013 1127   ALKPHOS 67 01/16/2023  0950   ALKPHOS 76 09/14/2013 1127   AST 21 01/16/2023 0950   ALT 13 01/16/2023 0950   ALT 19 09/14/2013 1127   BILITOT 0.5 01/16/2023 0950      RADIOGRAPHIC STUDIES: I have personally reviewed the radiological images as listed and agreed with the findings in the report. No results found.   ASSESSMENT & PLAN:   # Mantle cell lymphoma recurrent biopsy proven [July 2022]; Currently on acalbrutinib/Calquence [DISCONTINUED Rituximab given the multifocal pneumonia]-  PET scan April 11th, 2024- Stable PET-CT. No findings for recurrent lymphoma.      # Currently on Calequence 100 mg twice a day [AUG 1st, 2022]. Tolerating well. Stable. Plan to repeat PET.    # March 31st, 2023- right upper lobe pulmonary artery branches  consistent with pulmonary embolism- x 1 eliquis [1 year- until June 2024]. Managed by cardiology/Dr End.    # Immunocompromised state: Hx Multifocal pneumonia-[April 2023]-Given the multiple pneumonias/high risk of death-recommended continuation of further rituximab. MARCH 2024- IgG- 800. Labs pending. Monitor for now. No interval infections.    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   # Chronic respiratory failure -2 L home O2 -NICMP/pulmonary hypertension/chronic bilateral lung scarring-followed by pulmonary [Dr.Meier.[place]]; MAY 2024- HRCT-  Pulmonary parenchymal pattern of fibrosis, with interval resolution of previously seen superimposed ground-glass- consistent with UIP.  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 BNP normal. Weight stable.    # Left shoulder pain/ Chronic arthritis- on meloxicam as needed- stable.   # Vaccinations : s/p flu shot. Recommend covid booster, rsv, pneumonia.   # Acid reflux- likely diet related. Can try tums or pepcid. If daily symptoms recommend nexium vs GI/EGD.    # Incidental findings on  Imaging  CT , 2024:  Enlarged pulmonic trunk, indicative of pulmonary arterial hypertension. Aortic atherosclerosis reviewed/discussed/counseled the patient.    # DISPOSITION:  PET 2 mo- lab (cbc, cmp, ldh, immunoglobulins, BNP), Dr Donneta Romberg- [place]  No problem-specific Assessment & Plan notes found for this encounter.  No orders of the defined types were placed in this encounter.  All questions were answered. The patient knows to call the clinic with any problems, questions or concerns.     Alinda Dooms, NP 03/20/2023

## 2023-03-21 ENCOUNTER — Other Ambulatory Visit: Payer: Self-pay | Admitting: Internal Medicine

## 2023-03-24 ENCOUNTER — Other Ambulatory Visit (HOSPITAL_COMMUNITY): Payer: Self-pay

## 2023-03-24 ENCOUNTER — Other Ambulatory Visit: Payer: Self-pay

## 2023-03-24 DIAGNOSIS — G4733 Obstructive sleep apnea (adult) (pediatric): Secondary | ICD-10-CM | POA: Diagnosis not present

## 2023-03-24 DIAGNOSIS — J9601 Acute respiratory failure with hypoxia: Secondary | ICD-10-CM | POA: Diagnosis not present

## 2023-03-24 DIAGNOSIS — I2699 Other pulmonary embolism without acute cor pulmonale: Secondary | ICD-10-CM | POA: Diagnosis not present

## 2023-03-24 LAB — IMMUNOGLOBULINS A/E/G/M, SERUM
IgA: 95 mg/dL (ref 64–422)
IgE (Immunoglobulin E), Serum: <2 [IU]/mL — ABNORMAL LOW (ref 6–495)
IgG (Immunoglobin G), Serum: 799 mg/dL (ref 586–1602)
IgM (Immunoglobulin M), Srm: 8 mg/dL — ABNORMAL LOW (ref 26–217)

## 2023-03-24 NOTE — Progress Notes (Signed)
Specialty Pharmacy Refill Coordination Note  Michelle [name] is a 82 y.o. female contacted today regarding refills of specialty medication(s) Acalabrutinib Maleate   Patient requested Delivery   Delivery date: 03/31/23   Verified address: [address]   [place] [address]   Medication will be filled on 03/30/23.

## 2023-04-13 ENCOUNTER — Ambulatory Visit
Admission: RE | Admit: 2023-04-13 | Discharge: 2023-04-13 | Disposition: A | Discharge status: Home / Self Care | Payer: 59 | Source: Ambulatory Visit | Attending: Nurse Practitioner | Admitting: Nurse Practitioner

## 2023-04-13 DIAGNOSIS — C8311 Mantle cell lymphoma, lymph nodes of head, face, and neck: Secondary | ICD-10-CM | POA: Diagnosis not present

## 2023-04-13 DIAGNOSIS — C8590 Non-Hodgkin lymphoma, unspecified, unspecified site: Secondary | ICD-10-CM | POA: Diagnosis not present

## 2023-04-13 LAB — GLUCOSE, CAPILLARY: Glucose-Capillary: 97 mg/dL (ref 70–99)

## 2023-04-13 MED ORDER — FLUDEOXYGLUCOSE F - 18 (FDG) INJECTION
11.8000 | Freq: Once | INTRAVENOUS | Status: AC | PRN
  Administered 2023-04-13: 12.31 via INTRAVENOUS

## 2023-04-16 ENCOUNTER — Other Ambulatory Visit: Payer: Self-pay

## 2023-04-16 ENCOUNTER — Other Ambulatory Visit: Payer: Self-pay | Admitting: Internal Medicine

## 2023-04-16 ENCOUNTER — Other Ambulatory Visit (HOSPITAL_COMMUNITY): Payer: Self-pay

## 2023-04-16 DIAGNOSIS — C8311 Mantle cell lymphoma, lymph nodes of head, face, and neck: Secondary | ICD-10-CM

## 2023-04-16 MED ORDER — CALQUENCE 100 MG PO TABS
100.0000 mg | ORAL_TABLET | Freq: Two times a day (BID) | ORAL | 2 refills | Status: DC
  Filled 2023-04-16: qty 60, 30d supply, fill #0
  Filled 2023-05-20: qty 60, 30d supply, fill #1
  Filled 2023-06-19: qty 60, 30d supply, fill #2

## 2023-04-16 NOTE — Telephone Encounter (Signed)
CBC with Differential (Cancer Center Only) Order: [id number] Status: Final result     Visible to patient: Yes (not seen)     Next appt: 05/18/2023 at 08:45 AM in Pulmonology [facility] Aundria Rud, MD)     Dx: Philippa Chester cell lymphoma of lymph nodes o...   0 Result Notes          Component Ref Range & Units 3 wk ago 3 mo ago 5 mo ago 6 mo ago 8 mo ago 10 mo ago 11 mo ago  WBC Count 4.0 - 10.5 K/uL 8.1 8.4 8.0 7.8 8.4 7.2 8.0  RBC 3.87 - 5.11 MIL/uL 4.19 4.05 4.07 4.40 4.33 4.33 4.24  Hemoglobin 12.0 - 15.0 g/dL 40.9 81.1 91.4 78.2 95.6 12.9 12.5  HCT 36.0 - 46.0 % 39.9 39.0 38.8 40.8 40.7 40.5 38.9  MCV 80.0 - 100.0 fL 95.2 96.3 95.3 92.7 94.0 93.5 91.7  MCH 26.0 - 34.0 pg 29.8 29.6 30.2 30.0 30.0 29.8 29.5  MCHC 30.0 - 36.0 g/dL 21.3 08.6 57.8 46.9 62.9 31.9 32.1  RDW 11.5 - 15.5 % 13.9 13.8 14.6 14.8 14.0 14.8 14.9  Platelet Count 150 - 400 K/uL 198 193 206 200 208 197 215  nRBC 0.0 - 0.2 % 0.0 0.0 0.0 0.0 0.0 0.0 0.0  Neutrophils Relative % % 79 73 71 77 77 74 74  Neutro Abs 1.7 - 7.7 K/uL 6.4 6.3 5.8 5.9 6.5 5.4 5.9  Lymphocytes Relative % 15 19 20 18 15 19 20   Lymphs Abs 0.7 - 4.0 K/uL 1.2 1.6 1.6 1.4 1.3 1.4 1.6  Monocytes Relative % 4 5 6 4 5 5 5   Monocytes Absolute 0.1 - 1.0 K/uL 0.3 0.4 0.5 0.3 0.4 0.4 0.4  Eosinophils Relative % 1 2 2 1 2 1 1   Eosinophils Absolute 0.0 - 0.5 K/uL 0.1 0.1 0.1 0.1 0.1 0.1 0.1  Basophils Relative % 0 0 0 0 0 0 0  Basophils Absolute 0.0 - 0.1 K/uL 0.0 0.0 0.0 0.0 0.0 0.0 0.0  Immature Granulocytes % 1 1 1  0 1 1 0  Abs Immature Granulocytes 0.00 - 0.07 K/uL 0.05 0.04 CM 0.04 CM 0.03 CM 0.05 CM 0.04 CM 0.02 CM  Comment: Performed at [facility], [address] [place], [place] [address]  Resulting Agency [facility]         Specimen Collected: 03/20/23 10:21 Last Resulted: 03/20/23 10:33      Lab Flowsheet      Order Details      View Encounter       Lab and Collection Details      Routing      Result History    View All Conversations on this Encounter      CM=Additional comments      Result Care Coordination   Patient Communication   Add Comments   Add Notifications  Back to Top    Other Results from 03/20/2023  VITAMIN D 25 Hydroxy (Vit-D Deficiency, Fractures) Order: [id number] Status: Final result      Visible to patient: Yes (not seen)      Next appt: 05/18/2023 at 08:45 AM in Pulmonology Raechel Chute, MD)      Dx: Philippa Chester cell lymphoma of lymph nodes o...    0 Result Notes      Component Ref Range & Units 3 wk ago  Vit D, 25-Hydroxy 30 -  100 ng/mL 41.11  Comment: (NOTE) Vitamin D deficiency has been defined by the Institute of Medicine and an Endocrine Society practice guideline as a level of serum 25-OH vitamin D less than 20 ng/mL (1,2). The Endocrine Society went on to further define vitamin D insufficiency as a level between 21 and 29 ng/mL (2).  1. IOM (Institute of Medicine). 2010. Dietary reference intakes for calcium and D. [place]: [employer]. 2. Holick MF, Binkley [place], Bischoff-Ferrari HA, et al. Evaluation, treatment, and prevention of vitamin D deficiency: an Endocrine Society clinical practice guideline, JCEM. 2011 Jul; 96(7): 1911-30.  Performed at [facility], [address], [place], [place] [address]  Resulting Agency [facility] CLIN LAB         Specimen Collected: 03/20/23 10:21 Last Resulted: 03/20/23 17:00      Lab Flowsheet       Order Details       View Encounter       Lab and Collection Details       Routing       Result History     View All Conversations on this Encounter        Result Care Coordination   Patient Communication   Add Comments   Add Notifications  Back to Top       Contains abnormal data Immunoglobulins, QN, A/E/G/M Order: [id number] Status: Final result      Visible to patient: Yes (not seen)       Next appt: 05/18/2023 at 08:45 AM in Pulmonology Raechel Chute, MD)      Dx: Philippa Chester cell lymphoma of lymph nodes o...    0 Result Notes            Component Ref Range & Units 3 wk ago 3 mo ago 5 mo ago 6 mo ago 8 mo ago 10 mo ago 11 mo ago  IgG (Immunoglobin G), Serum 586 - 1,602 mg/dL 621 308 657 846 962 952 898  IgA 64 - 422 mg/dL 95 83 85 93 91 841 83  IgM (Immunoglobulin M), Srm 26 - 217 mg/dL 8 Low  9 Low  CM 9 Low  CM 11 Low  CM 11 Low  CM 10 Low  CM 10 Low  CM  Comment: Result confirmed on concentration.  IgE (Immunoglobulin E), Serum 6 - 495 IU/mL <2 Low  <2 Low  CM QNSTST R, CM <2 Low  CM <2 Low  CM <2 Low  CM <2 Low  CM  Comment: (NOTE) Performed At: [facility] [address] [place], [place] [id number] Jolene Schimke MD OZ:[id number]  Resulting Agency [facility]         Specimen Collected: 03/20/23 10:21 Last Resulted: 03/24/23 11:37      Lab Flowsheet       Order Details       View Encounter       Lab and Collection Details       Routing       Result History     View All Conversations on this Encounter      CM=Additional comments  R=Reference range differs from displayed range      Result Care Coordination   Patient Communication   Add Comments   Add Notifications  Back to Top      Brain natriuretic peptide Order: [id number] Status: Final result  Visible to patient: Yes (not seen)      Next appt: 05/18/2023 at 08:45 AM in Pulmonology [facility] Aundria Rud, MD)      Dx: Philippa Chester cell lymphoma of lymph nodes o...    0 Result Notes            Component Ref Range & Units 3 wk ago 3 mo ago 5 mo ago 6 mo ago 8 mo ago 10 mo ago 11 mo ago  B Natriuretic Peptide 0.0 - 100.0 pg/mL 74.1 97.7 CM 65.2 CM 41.2 CM 42.3 CM 53.3 CM 47.4 CM  Comment: Performed at [facility], [address] [place], [place] [address]  Resulting Agency [facility]  [facility] LAB [facility]         Specimen Collected: 03/20/23 10:21 Last Resulted: 03/20/23 11:17      Lab Flowsheet       Order Details       View Encounter       Lab and Collection Details       Routing       Result History     View All Conversations on this Encounter      CM=Additional comments      Result Care Coordination   Patient Communication   Add Comments   Add Notifications  Back to Top      Lactate dehydrogenase Order: [id number] Status: Final result      Visible to patient: Yes (not seen)      Next appt: 05/18/2023 at 08:45 AM in Pulmonology Raechel Chute, MD)      Dx: Philippa Chester cell lymphoma of lymph nodes o...    0 Result Notes            Component Ref Range & Units 3 wk ago 3 mo ago 5 mo ago 6 mo ago 8 mo ago 10 mo ago 11 mo ago  LDH 98 - 192 U/L 151 160 CM 170 CM 150 CM 146 CM 150 CM 143 CM  Comment: Performed at [facility], [address] [place], [place] [address]  Resulting Agency [facility] CLIN LAB         Specimen Collected: 03/20/23 10:21 Last Resulted: 03/20/23 10:45      Lab Flowsheet       Order Details       View Encounter       Lab and Collection Details       Routing       Result History     View All Conversations on this Encounter      CM=Additional comments      Result Care Coordination   Patient Communication   Add Comments   Add Notifications  Back to Top       Contains abnormal data CMP (Cancer Center only) Order: [id number] Status: Final result      Visible to patient: Yes (not seen)      Next appt: 05/18/2023 at 08:45 AM in Pulmonology Raechel Chute, MD)      Dx: Philippa Chester cell lymphoma of lymph nodes o...    0 Result Notes            Component Ref Range & Units 3 wk ago (03/20/23) 3 mo ago (01/16/23) 5 mo ago (11/14/22) 6 mo ago (09/19/22) 8 mo ago (08/01/22)  10 mo ago (06/19/22) 10 mo ago (06/13/22)  Sodium 135 - 145 mmol/L 137 139 140 137 138 137 138  Potassium 3.5 - 5.1 mmol/L 4.2 3.7 3.8 4.3 3.5 4.1 4.0  Chloride 98 - 111 mmol/L 102 103 100 100 103 101 102  CO2 22 - 32 mmol/L 28 29 30 28 27 27 27   Glucose, Bld 70 - 99 mg/dL 440 High  102 High  CM 108 High  CM 102 High  CM 90 CM 102 High  CM 103 High  CM  Comment: Glucose reference range applies only to samples taken after fasting for at least 8 hours.  BUN 8 - 23 mg/dL 16 15 13 18 14 16 16   Creatinine 0.44 - 1.00 mg/dL 7.25 3.66 4.40 3.47 4.25 0.76 0.73  Calcium 8.9 - 10.3 mg/dL 9.0 8.8 Low  9.2 9.0 8.8 Low  8.8 Low  9.2  Total Protein 6.5 - 8.1 g/dL 6.5 6.4 Low  6.6 6.6 6.5 6.8   Albumin 3.5 - 5.0 g/dL 3.6 3.6 3.7 3.9 3.6 3.7   AST 15 - 41 U/L 21 21 24 23 24 22    ALT 0 - 44 U/L 13 13 13 13 14 13    Alkaline Phosphatase 38 - 126 U/L 68 67 65 69 66 72   Total Bilirubin 0.3 - 1.2 mg/dL 0.7 0.5 0.8 0.5 0.4 0.4   GFR, Estimated >60 mL/min >60 >60 CM >60 CM >60 CM     Comment: (NOTE) Calculated using the CKD-EPI Creatinine Equation (2021)  Anion gap 5 - 15 7 7  CM 10 CM 9 CM 8 CM 9 CM 9 CM  Comment: Performed at [facility], [address] [place], [place] [address]  Resulting Agency [facility] CLIN LAB [facility] CLIN [facility] CL[facility] CLIN LAB         Specimen Collected: 03/20/23 10:21 Last Resulted: 03/20/23 10:48

## 2023-04-16 NOTE — Progress Notes (Signed)
Specialty Pharmacy Refill Coordination Note  Michelle [name] is a 82 y.o. female contacted today regarding refills of specialty medication(s) Acalabrutinib Maleate   Patient requested Delivery   Delivery date: 04/24/23   Verified address: [address]   [place] [address]   Medication will be filled on 04/23/23.  Refill request pending.

## 2023-04-20 ENCOUNTER — Encounter: Payer: Self-pay | Admitting: Family

## 2023-04-20 ENCOUNTER — Other Ambulatory Visit (HOSPITAL_COMMUNITY): Payer: Self-pay

## 2023-04-20 ENCOUNTER — Telehealth (INDEPENDENT_AMBULATORY_CARE_PROVIDER_SITE_OTHER): Payer: 59 | Admitting: Family

## 2023-04-20 ENCOUNTER — Telehealth: Payer: Self-pay | Admitting: Family

## 2023-04-20 VITALS — Ht 68.0 in | Wt 218.0 lb

## 2023-04-20 DIAGNOSIS — J40 Bronchitis, not specified as acute or chronic: Secondary | ICD-10-CM | POA: Diagnosis not present

## 2023-04-20 MED ORDER — AMOXICILLIN-POT CLAVULANATE 875-125 MG PO TABS: 1.0000 | ORAL_TABLET | Freq: Two times a day (BID) | ORAL | 0 refills | Status: AC

## 2023-04-20 NOTE — Assessment & Plan Note (Addendum)
No acute respiratory distress.  Discussed with patient risk factors including chronic hypoxia, interstitial lung disease, immunosuppression.  Counseled on if Sa02< 90, which would warrant calling 911.  Recent PET scan, working to get imaging resulted , if not we will order chest x-ray.  Start Augmentin.  Continue DayQuil or NyQuil OTC

## 2023-04-20 NOTE — Telephone Encounter (Signed)
Call [facility] and have pet scan read asap  If not , I will order chest xray

## 2023-04-20 NOTE — Progress Notes (Signed)
Verbal consent for services obtained from patient prior to services given to TELEPHONE visit:   Location of call:  provider at work patient at home  Names of all persons present for services: Rennie Plowman, NP and patient  Complains of dry to productive cough x 2 weeks, unchanged  She has tried tessalon,  nyquil and dayquil with very little resolution.    Mucous is breaking up.   Chills two days ago, resolved.   Denies fever, leg swelling, sinus pain, ear pain.  She sleeps with head elevated 12 inches, this is not new for her.   History of atherosclerosis of aortic, heart failure reduced ejection fraction, COVID PNA 2023, rheumatoid arthritis, mantal cell lymphoma, interstitial lung disease  She is using oxygen all the time, 3L.   Follow-up pulmonology 02/16/2023  PET scan 04/13/23  CT chest HR 10/03/2022 with findings likely due to resolved COVID-pneumonia superimposed on usual interstitial pneumonitis  GFR 60  Echocardiogram 45-50% 06/04/22  BNP 74.1  A/P/next steps:  Bronchitis Assessment & Plan: No acute respiratory distress.  Discussed with patient risk factors including chronic hypoxia, interstitial lung disease, immunosuppression.  Counseled on if Sa02< 90, which would warrant calling 911.  Recent PET scan, working to get imaging resulted , if not we will order chest x-ray.  Start Augmentin.  Continue DayQuil or NyQuil OTC  Orders: -     Amoxicillin-Pot Clavulanate; Take 1 tablet by mouth 2 (two) times daily for 7 days.  Dispense: 14 tablet; Refill: 0   Wt Readings from Last 3 Encounters:  04/20/23 218 lb (98.9 kg)  03/20/23 228 lb (103.4 kg)  02/16/23 229 lb (103.9 kg)     Advised patient AVS could be mailed or viewed over MyChart if MyChart user.   I spent 15 min  discussing plan of care over the phone.

## 2023-04-20 NOTE — Patient Instructions (Signed)
Start Augmentin, antibiotic  Ensure to take probiotics while on antibiotics and also for 2 weeks after completion. This can either be by eating yogurt daily or taking a probiotic supplement over the counter such as Culturelle.It is important to re-colonize the gut with good bacteria and also to prevent any diarrheal infections associated with antibiotic use.   Please let me know how you are doing

## 2023-04-20 NOTE — Telephone Encounter (Signed)
Called [facility] and spoke to receptionist she stated that she will have someone read today

## 2023-04-23 NOTE — Telephone Encounter (Signed)
Spoke to pt went over results  and she is still coughing and I scheduled her to have chest xray done on 04/27/23 @ 3 pm

## 2023-04-23 NOTE — Telephone Encounter (Signed)
Call pt  Please let her know that PET scan of her chest, although not typically used to evaluate for acute lung disease, did show STABLE Chronic interstitial thickening.   If she feeling better after starting augmentin?   If cough is not significantly improved, I would recommend a chest x-ray.  Please let me know how she is doing

## 2023-04-24 DIAGNOSIS — J9601 Acute respiratory failure with hypoxia: Secondary | ICD-10-CM | POA: Diagnosis not present

## 2023-04-24 DIAGNOSIS — I2699 Other pulmonary embolism without acute cor pulmonale: Secondary | ICD-10-CM | POA: Diagnosis not present

## 2023-04-24 DIAGNOSIS — G4733 Obstructive sleep apnea (adult) (pediatric): Secondary | ICD-10-CM | POA: Diagnosis not present

## 2023-04-27 ENCOUNTER — Ambulatory Visit (INDEPENDENT_AMBULATORY_CARE_PROVIDER_SITE_OTHER): Payer: 59

## 2023-04-27 ENCOUNTER — Other Ambulatory Visit: Payer: 59

## 2023-04-27 ENCOUNTER — Other Ambulatory Visit: Payer: Self-pay

## 2023-04-27 DIAGNOSIS — R053 Chronic cough: Secondary | ICD-10-CM | POA: Diagnosis not present

## 2023-04-27 DIAGNOSIS — R051 Acute cough: Secondary | ICD-10-CM

## 2023-04-27 DIAGNOSIS — J40 Bronchitis, not specified as acute or chronic: Secondary | ICD-10-CM | POA: Diagnosis not present

## 2023-04-27 DIAGNOSIS — R918 Other nonspecific abnormal finding of lung field: Secondary | ICD-10-CM | POA: Diagnosis not present

## 2023-04-29 ENCOUNTER — Other Ambulatory Visit: Payer: Self-pay | Admitting: Family

## 2023-04-29 ENCOUNTER — Telehealth: Payer: Self-pay | Admitting: Family

## 2023-04-29 DIAGNOSIS — R0602 Shortness of breath: Secondary | ICD-10-CM

## 2023-04-29 DIAGNOSIS — M069 Rheumatoid arthritis, unspecified: Secondary | ICD-10-CM

## 2023-04-29 MED ORDER — IPRATROPIUM-ALBUTEROL 0.5-2.5 (3) MG/3ML IN SOLN: 3.0000 mL | Freq: Four times a day (QID) | RESPIRATORY_TRACT | 2 refills | Status: DC | PRN

## 2023-04-29 NOTE — Telephone Encounter (Signed)
  Rasheedah, I placed urgent referral to rheumatology.  Please let me know status  Michella Detjen clinical,  Call patient.  Advised patient that I collaborated with pulmonology and we jointly agreed concern for autoimmune process.  Patient previously followed with [facility]ology.  We placed a new referral to Dr. Allena Katz whom is a new rheumatologist.

## 2023-04-29 NOTE — Telephone Encounter (Signed)
Pt called stating she would like a medication called in for her cough. Pt stated she saw the provider for this

## 2023-05-04 NOTE — Telephone Encounter (Signed)
Called pt, unable to leave vm due vm not being set up

## 2023-05-04 NOTE — Telephone Encounter (Signed)
Pt notified of referral. Number given to pt to call and schedule with Dr. Allena Katz

## 2023-05-07 ENCOUNTER — Ambulatory Visit: Payer: 59 | Attending: Student in an Organized Health Care Education/Training Program

## 2023-05-07 ENCOUNTER — Telehealth: Payer: Self-pay

## 2023-05-07 ENCOUNTER — Ambulatory Visit: Payer: 59

## 2023-05-07 DIAGNOSIS — Z5309 Procedure and treatment not carried out because of other contraindication: Secondary | ICD-10-CM | POA: Insufficient documentation

## 2023-05-07 DIAGNOSIS — J9611 Chronic respiratory failure with hypoxia: Secondary | ICD-10-CM | POA: Diagnosis not present

## 2023-05-07 DIAGNOSIS — R058 Other specified cough: Secondary | ICD-10-CM | POA: Insufficient documentation

## 2023-05-07 MED ORDER — ALBUTEROL SULFATE (2.5 MG/3ML) 0.083% IN NEBU
2.5000 mg | INHALATION_SOLUTION | Freq: Once | RESPIRATORY_TRACT | Status: AC
Start: 2023-05-07 — End: 2023-05-07
  Administered 2023-05-07: 2.5 mg via RESPIRATORY_TRACT
  Filled 2023-05-07: qty 3

## 2023-05-07 NOTE — Telephone Encounter (Signed)
Patient states she had an appointment for a breathing test today and she couldn't do it because of her cough.  Patient states the person who was going to give her the breathing test told her that the antibiotic we gave her isn't strong enough.  Patient states the person who was going to do her breathing test told her that he will reach out to Rennie Plowman, FNP, and patient's lung doctor, but patient states he asked her to contact Rennie Plowman, FNP, as well.

## 2023-05-08 ENCOUNTER — Other Ambulatory Visit: Payer: Self-pay | Admitting: Family

## 2023-05-08 DIAGNOSIS — J40 Bronchitis, not specified as acute or chronic: Secondary | ICD-10-CM

## 2023-05-08 MED ORDER — PREDNISONE 10 MG PO TABS
ORAL_TABLET | ORAL | 0 refills | Status: DC
Start: 2023-05-08 — End: 2023-07-28

## 2023-05-08 NOTE — Telephone Encounter (Signed)
Spoke to receptionist @ [facility] and was able to schedule pt on 04/12/23 @ 1:30 pm at [place] office. Called pt to inform her but was unable to lvm . Vm was full will try back later

## 2023-05-08 NOTE — Telephone Encounter (Signed)
Called pt back and reiterated the following message below and also informed her of her appt on 05/12/23 @ 1:30 pm @ Pulmonology in [place]. Pt verbalized understanding and she wrote down instructions of how to take the Prednisone taper as well.

## 2023-05-08 NOTE — Telephone Encounter (Signed)
Dr Larinda Buttery,   [facility] you are doing well.  Would appreciate your advise for this patient as I am concerned regarding persistent cough and know you are covering Dr Doreene Adas box.   CXR 04/27/23 without infiltrate  She was unable to complete PFTs yesterday.  Unfortunately rheumatology appointment is a few weeks out.    She is using the DuoNeb for temporary relief.  Reviewing chart, Dr Thora Lance had placed her on prednisone taper 10/08/21 after CAP.   Would it be reasonable to start prednisone course?  Does your office have any earlier appointments next week to see her?   If easier to talk on the phone,cell below  Thank you  [facility]  Cell [phone]

## 2023-05-08 NOTE — Telephone Encounter (Signed)
Dr Aundria Rud,   Per our last conversation and concern for anti-CCP, anti-Ro (anti-SSA) significantly elevated; concerned for CTD-ILD.   Pt seeing Dr Allena Katz. Rheumatology in 06/29/22  Per respiratory, patient was unable to complete pulmonary function testing yesterday due to cough   CXR 11/25 /24 without evidence of pneumonia.    I recently treated her with empiric course of augmentin 04/20/23 without resolution.   Follow up in your office 05/18/23    Naimah Yingst team- please call [facility] and ask whom is covering Dr Doreene Adas box as out off office.   Please see if ANY cancellations today or Monday in which pt can be seen  [phone]

## 2023-05-11 NOTE — Telephone Encounter (Signed)
noted 

## 2023-05-12 ENCOUNTER — Ambulatory Visit: Payer: 59 | Admitting: Internal Medicine

## 2023-05-12 ENCOUNTER — Encounter: Payer: Self-pay | Admitting: Internal Medicine

## 2023-05-12 VITALS — BP 132/74 | HR 71 | Temp 97.8°F | Ht 67.0 in | Wt 227.4 lb

## 2023-05-12 DIAGNOSIS — Z8616 Personal history of COVID-19: Secondary | ICD-10-CM

## 2023-05-12 DIAGNOSIS — J84112 Idiopathic pulmonary fibrosis: Secondary | ICD-10-CM | POA: Diagnosis not present

## 2023-05-12 DIAGNOSIS — R0602 Shortness of breath: Secondary | ICD-10-CM

## 2023-05-12 DIAGNOSIS — J9691 Respiratory failure, unspecified with hypoxia: Secondary | ICD-10-CM

## 2023-05-12 DIAGNOSIS — R9389 Abnormal findings on diagnostic imaging of other specified body structures: Secondary | ICD-10-CM | POA: Diagnosis not present

## 2023-05-12 DIAGNOSIS — J849 Interstitial pulmonary disease, unspecified: Secondary | ICD-10-CM

## 2023-05-12 DIAGNOSIS — Z86718 Personal history of other venous thrombosis and embolism: Secondary | ICD-10-CM

## 2023-05-12 DIAGNOSIS — Z8572 Personal history of non-Hodgkin lymphomas: Secondary | ICD-10-CM

## 2023-05-12 NOTE — Progress Notes (Signed)
SYNOPSIS Patient is a pleasant 82 year old female with a history of multiple medical problems presenting to clinic today for follow up. She was previously followed by Dr. Thora Lance, last seen June of 2023. Dr. Thora Lance has since left our group and she would like to be seen locally in [place].  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 She was admitted with acute hypoxic respiratory failure to [facility] in March of 2023. She was found to have COVID with persistence of viral detection. She was treated with steroids, antibiotics and molnupravir. At that point, she was treated with Rituximab. She was also found to have an acute PE treated with Apixaban. She was discharged home on oxygen therapy with a prednisone taper and diuretics. She was re-admitted in May of 2023 again with worsening respiratory failure and diagnosed with multi-focal pneumonia. Patient again treated with antibiotics and steroids. COVID was still positive during said admission in May of 2023. Imaging during said admission noted underlying fibrotic lung disease (UIP pattern) with super imposed ground glass opacities. She was then seen by Dr. Lilian Kapur in clinic and prescribed a prolonged prednisone taper. She was ordered a repeat HRCT and is presenting today for follow up.  Patient is followed closely by oncology for Mantle cell lymphoma. She follows with Dr. Donneta Romberg. She was last seen by him on 01/16/2023. Patient is maintained on Acalabrutinib. Rituximab had been discontinued for over a year now. She also follows with Dr. Okey Dupre from cardiology regarding NICM (EF as low as 30% in 2021, recovered to 60% in 2023) and is maintained on goal directed medical therapy. 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  Past medical history notable for NICM, HFpEF, OSA on CPAP, RA, GERD, and Stage IV mantle cell lymphoma. She  reports being diagnosed with RA by Dr. Gavin Potters many years ago. There is no serology for RA evident in our records. She has not been seen by rheumatology for over 5 years.  Ancillary information including prior medications, full medical/surgical/family/social histories, and PFTs (when available) are listed below and have been reviewed.    Subjective:   PATIENT ID: Michelle [name] GENDER: female DOB: 11-Mar-1941, MRN: [id number]  CC Follow up ILD Follw up SOB  HPI No exacerbation at this time No evidence of heart failure at this time No evidence or signs of infection at this time No respiratory distress No fevers, chills, nausea, vomiting, diarrhea No evidence of lower extremity edema No evidence hemoptysis Patient currently on prednisone 10 mg daily and feels much better   CT chest reviewed in detail with patient       Objective:    97% on RA  BMI Readings from Last 3 Encounters:  05/12/23 35.62 kg/m  04/20/23 33.15 kg/m  03/20/23 34.67 kg/m   Wt Readings from Last 3 Encounters:  05/12/23 227 lb 6.4 oz (103.1 kg)  04/20/23 218 lb (98.9 kg)  03/20/23 228 lb (103.4 kg)    BP 132/74 (BP Location: Left Arm, Cuff Size: Normal)   Pulse 71   Temp 97.8 F (36.6 C) (Temporal)   Ht 5\' 7"  (1.702 m)   Wt 227 lb 6.4 oz (103.1 kg)   SpO2 97%   BMI 35.62 kg/m     Review of Systems: Gen:  Denies  fever, sweats, chills weight loss  HEENT: Denies blurred vision, double vision, ear pain, eye pain, hearing loss,  nose bleeds, sore throat Cardiac:  No dizziness, chest pain or heaviness, chest tightness,edema, No JVD Resp:   No cough, -sputum production, +shortness of breath,-wheezing, -hemoptysis,  Other:  All other systems negative   Physical Examination:   General Appearance: No distress  EYES PERRLA, EOM intact.   NECK Supple, No JVD Pulmonary: normal breath sounds, No wheezing.  CardiovascularNormal S1,S2.  No m/r/g.   Abdomen: Benign, Soft,  non-tender. Neurology UE/LE 5/5 strength, no focal deficits Ext pulses intact, cap refill intact ALL OTHER ROS ARE NEGATIVE    Ancillary Information    Past Medical History:  Diagnosis Date   Arthritis    Collagen vascular disease (HCC)    GERD (gastroesophageal reflux disease)    Headache(784.0)    HFrEF (heart failure with reduced ejection fraction) (HCC)    Nonischemic cardiomyopathy, LVEF as low as 30-35% in 08/2019   History of methotrexate therapy    Hyperlipidemia    hx   Lymphadenopathy of head and neck 01/2015   see on Thyroid ultrasound   Lymphoma, mantle cell (HCC) 06/01/2015   bx of lymph node in right breast/Stage IV Mantle Cell Lymphoma   Multifocal pneumonia 10/05/2021   Personal history of chemotherapy    Pulmonary embolism (HCC) 10/05/2021   acute   Respiratory failure (HCC) 10/05/2021   acute   Rheumatoid arthritis (HCC)      Family History  Problem Relation Age of Onset   Diabetes Mother    Cholelithiasis Mother    Hypertension Sister    Diabetes Sister    Heart murmur Sister    Arthritis Brother    Breast cancer Neg Hx      Past Surgical History:  Procedure Laterality Date   BREAST BIOPSY Left 11/07/2020   u/s bx-hydromark #3 "coil"-path pending   CARDIAC CATHETERIZATION  09/2004   [facility]; EF 60%   CARDIAC CATHETERIZATION  08/2004   [facility]   IR FLUORO GUIDED NEEDLE PLC ASPIRATION/INJECTION LOC  06/18/2018   PERIPHERAL VASCULAR CATHETERIZATION N/A 07/04/2015   Procedure: Shelda Pal Cath Insertion;  Surgeon: Annice Needy, MD;  Location: [facility] INVASIVE CV LAB;  Service: Cardiovascular;  Laterality: N/A;   PORTA CATH REMOVAL N/A 06/23/2018   Procedure: PORTA CATH REMOVAL;  Surgeon: Annice Needy, MD;  Location: [facility] INVASIVE CV LAB;  Service: Cardiovascular;  Laterality: N/A;   RIGHT/LEFT HEART CATH AND CORONARY ANGIOGRAPHY Bilateral 09/20/2019   Procedure: RIGHT/LEFT HEART CATH AND CORONARY ANGIOGRAPHY;  Surgeon: Yvonne Kendall, MD;  Location: [facility] INVASIVE  CV LAB;  Service: Cardiovascular;  Laterality: Bilateral;    Social History   Socioeconomic History   Marital status: Married    Spouse name: Not on file   Number of children: Not on file   Years of education: Not on file   Highest education level: Not on file  Occupational History   Not on file  Tobacco Use   Smoking status: Never   Smokeless tobacco: Never  Vaping Use   Vaping status: Never Used  Substance and Sexual Activity   Alcohol use: No   Drug use: No   Sexual activity: Never  Other Topics Concern   Not on file  Social History Narrative   ** Merged History Encounter **       Lives in [place]. Works as Clinical biochemist.   Social Determinants of Health   Financial Resource Strain: Low Risk  (12/03/2022)   Overall Financial Resource Strain (CARDIA)    Difficulty of Paying Living Expenses: Not hard at  all  Food Insecurity: No Food Insecurity (12/03/2022)   Hunger Vital Sign    Worried About Running Out of Food in the Last Year: Never true    Ran Out of Food in the Last Year: Never true  Transportation Needs: No Transportation Needs (12/03/2022)   PRAPARE - Administrator, Civil Service (Medical): No    Lack of Transportation (Non-Medical): No  Physical Activity: Inactive (12/03/2022)   Exercise Vital Sign    Days of Exercise per Week: 0 days    Minutes of Exercise per Session: 0 min  Stress: No Stress Concern Present (12/03/2022)   [employer] of Occupational Health - Occupational Stress Questionnaire    Feeling of Stress : Not at all  Social Connections: Moderately Integrated (12/03/2022)   Social Connection and Isolation Panel [NHANES]    Frequency of Communication with Friends and Family: More than three times a week    Frequency of Social Gatherings with Friends and Family: More than three times a week    Attends Religious Services: More than 4 times per year    Active Member of [employer] or Organizations: Yes    Attends Banker Meetings:  More than 4 times per year    Marital Status: Widowed  Intimate Partner Violence: Not At Risk (12/03/2022)   Humiliation, Afraid, Rape, and Kick questionnaire    Fear of Current or Ex-Partner: No    Emotionally Abused: No    Physically Abused: No    Sexually Abused: No     No Known Allergies   CBC    Component Value Date/Time   WBC 8.1 03/20/2023 1021   WBC 8.4 08/01/2022 1006   RBC 4.19 03/20/2023 1021   HGB 12.5 03/20/2023 1021   HGB 13.4 09/08/2019 1359   HCT 39.9 03/20/2023 1021   HCT 40.9 09/08/2019 1359   PLT 198 03/20/2023 1021   PLT 194 09/08/2019 1359   MCV 95.2 03/20/2023 1021   MCV 90 09/08/2019 1359   MCV 89 09/14/2013 1127   MCH 29.8 03/20/2023 1021   MCHC 31.3 03/20/2023 1021   RDW 13.9 03/20/2023 1021   RDW 13.9 09/08/2019 1359   RDW 15.1 (H) 09/14/2013 1127   LYMPHSABS 1.2 03/20/2023 1021   LYMPHSABS 1.3 09/08/2019 1359   MONOABS 0.3 03/20/2023 1021   EOSABS 0.1 03/20/2023 1021   EOSABS 0.1 09/08/2019 1359   BASOSABS 0.0 03/20/2023 1021   BASOSABS 0.0 09/08/2019 1359    Pulmonary Functions Testing Results:    Latest Ref Rng & Units 11/19/2021    1:33 PM  PFT Results  FVC-Pre L 1.24   FVC-Predicted Pre % 50   FVC-Post L 1.27   FVC-Predicted Post % 52   Pre FEV1/FVC % % 82   Post FEV1/FCV % % 91   FEV1-Pre L 1.02   FEV1-Predicted Pre % 53   FEV1-Post L 1.15   TLC L 3.08   TLC % Predicted % 55   RV % Predicted % 61     Outpatient Medications Prior to Visit  Medication Sig Dispense Refill   acalabrutinib maleate (CALQUENCE) 100 MG tablet Take 1 tablet (100 mg total) by mouth 2 (two) times daily. 60 tablet 2   acetaminophen (TYLENOL) 500 MG tablet Take 1,000 mg by mouth every 6 (six) hours as needed for mild pain.      albuterol (VENTOLIN HFA) 108 (90 Base) MCG/ACT inhaler 2 puffs inhaled orally every 4 to 6 hours as needed, not to exceed  12 puffs in 24 hours 18 each 1   apixaban (ELIQUIS) 5 MG TABS tablet TAKE 1 TABLET BY MOUTH TWICE A DAY 60  tablet 5   benzonatate (TESSALON) 100 MG capsule TAKE 1 CAPSULE BY MOUTH 2 TIMES DAILY AS NEEDED FOR COUGH. 40 capsule 0   carvedilol (COREG) 3.125 MG tablet TAKE 1 TABLET BY MOUTH TWICE A DAY 180 tablet 1   feeding supplement (ENSURE ENLIVE / ENSURE PLUS) LIQD Take 237 mLs by mouth 3 (three) times daily between meals. 237 mL 12   fluticasone (FLONASE) 50 MCG/ACT nasal spray PLACE 2 SPRAYS INTO BOTH NOSTRILS DAILY AS NEEDED FOR ALLERGIES. 48 mL 1   furosemide (LASIX) 40 MG tablet TAKE 1 TABLET BY MOUTH EVERY DAY 90 tablet 1   ipratropium-albuterol (DUONEB) 0.5-2.5 (3) MG/3ML SOLN Take 3 mLs by nebulization every 6 (six) hours as needed. 300 mL 2   KLOR-CON M10 10 MEQ tablet TAKE 1 TABLET BY MOUTH EVERY DAY 90 tablet 1   losartan (COZAAR) 25 MG tablet TAKE 1/2 TABLET BY MOUTH EVERY DAY 45 tablet 0   MEGARED OMEGA-3 KRILL OIL PO Take 1 tablet by mouth daily.     Multiple Vitamins-Minerals (CENTRUM SILVER 50+WOMEN) TABS Take 1 tablet by mouth daily.     ondansetron (ZOFRAN) 8 MG tablet ONE PILL EVERY 8 HOURS AS NEEDED FOR NAUSEA/VOMITTING. 40 tablet 1   OXYGEN Inhale into the lungs. 2 Liters 24/7     predniSONE (DELTASONE) 10 MG tablet Take 40 mg po qam days 1-3, 30mg  po qam days 4-6, 20mg  po qam days 7-9 and 10mg  po qam days 10-12. 30 tablet 0   rosuvastatin (CRESTOR) 5 MG tablet Take 1 tablet (5 mg total) by mouth daily. 90 tablet 3   Facility-Administered Medications Prior to Visit  Medication Dose Route Frequency Provider Last Rate Last Admin   heparin lock flush 100 unit/mL  500 Units Intravenous Once Louretta Shorten R, MD       sodium chloride flush (NS) 0.9 % injection 10 mL  10 mL Intravenous Once Earna Coder, MD       sodium chloride flush (NS) 0.9 % injection 10 mL  10 mL Intravenous Once Earna Coder, MD          Assessment & Plan:  82 year old pleasant African-American female with chronic respiratory failure and hypoxia due to underlying interstitial lung  disease UIP pattern with underlying diagnosis of rheumatoid arthritis with a history of mantle cell lymphoma immunocompromised as well as history of PE on oral anticoagulation with morbid obesity and chronic respiratory insufficiency  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 The ground glass opacities are improved. She carries a history of rheumatoid arthritis  Overall patient is feeling better on current regimen of prednisone We will do a follow-up assessment in 3 months Consider re-referral to rheumatology however at this time no significant joint compromise or progressive respiratory symptoms Patient unable to obtain pulmonary function test     MEDICATION ADJUSTMENTS/LABS AND TESTS ORDERED: Continue  Prednisone 10 mg daily for next 2 weeks NEBULIZER every 6 hrs as needed Use Oxygen as needed Breathing Toy Incentive Spirometry 10 times per day Avoid secondhand smoke Avoid SICK contacts Recommend  Masking  when appropriate Recommend Keep up-to-date with vaccinations   CURRENT MEDICATIONS REVIEWED AT LENGTH WITH PATIENT TODAY   Patient  satisfied with Plan of action and management. All questions answered  Follow up  6 months    Time Spent Involved in Patient Care on Day of Examination: 42 mins     Wilbert Schouten Santiago Glad, M.D.  [facility]  Medical Director [facility] Medical Director [facility] Cardio-Pulmonary Department

## 2023-05-12 NOTE — Patient Instructions (Signed)
Continue  Prednisone 10 mg daily for next 2 weeks  NEBULIZER every 6 hrs as needed  Use Oxygen as needed  Breathing Toy Incentive Spirometry 10 times per day  Avoid secondhand smoke Avoid SICK contacts Recommend  Masking  when appropriate Recommend Keep up-to-date with vaccinations

## 2023-05-18 ENCOUNTER — Ambulatory Visit: Payer: 59 | Admitting: Student in an Organized Health Care Education/Training Program

## 2023-05-18 ENCOUNTER — Encounter: Payer: Self-pay | Admitting: Student in an Organized Health Care Education/Training Program

## 2023-05-18 VITALS — BP 106/76 | HR 64 | Temp 98.2°F | Ht 67.0 in | Wt 227.0 lb

## 2023-05-18 DIAGNOSIS — I8290 Acute embolism and thrombosis of unspecified vein: Secondary | ICD-10-CM | POA: Diagnosis not present

## 2023-05-18 DIAGNOSIS — C8311 Mantle cell lymphoma, lymph nodes of head, face, and neck: Secondary | ICD-10-CM | POA: Diagnosis not present

## 2023-05-18 DIAGNOSIS — J9611 Chronic respiratory failure with hypoxia: Secondary | ICD-10-CM | POA: Diagnosis not present

## 2023-05-18 DIAGNOSIS — M069 Rheumatoid arthritis, unspecified: Secondary | ICD-10-CM | POA: Diagnosis not present

## 2023-05-18 DIAGNOSIS — R0602 Shortness of breath: Secondary | ICD-10-CM

## 2023-05-18 NOTE — Progress Notes (Signed)
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 I have reviewed her previous imaging studies that showed underlying UIP pattern with subpleural reticulation and honeycombing. 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 CT imaging at that time was notable for the underlying UIP with additional ground glass opacities suggesting active inflammation. B-Cell depletion with anti-CD-20 monoclonal antibodies has been associated with persistent COVID infections with multiple underlying patterns of lung damage (including organizing pneumonia, diffuse alveolar damage, etc...). She did not have any tissue diagnosis and the underlying pattern is difficult to ascertain.   Her most recent HRCT was noted for a UIP pattern, with some progression in fibrosis compared to previous imaging dating back to 2018. The ground glass opacities are improved. She carries a history of rheumatoid arthritis. I had repeated her auto-immune workup during her prior visit which came back with a positive RF and anti-CCP. 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 She as unable to obtain her PFT's. The CT images on the PET scan show the pulmonary fibrosis to be stable. She did present with cough last week treated with brief course of steroids that I suspect was secondary to an URTI. This is now resolved.   Options for therapy would include anti-fibrotic therapy for IPF vs disease modifying agents for rheumatoid arthritis. The fact that she had a persistent COVID-19 infection with immune suppression complicates our approach. I'm inclined to initiate  anti-fibrotic therapy, but I would like for her to be seen by rheumatology first (scheduled 06/30/2023)   - Continue 3L per [place] with exertion and nocturnally - Unable to perform PFT's - continue nocturnal CPAP - Continue Apixaban - Will consider anti-fibrotics after she's met with rheumatology - Paperwork filled for handicap parking sticker  Return in about 2 months (around 07/19/2023).  I spent 30 minutes caring for this patient today, including preparing to see the patient, obtaining a medical history , reviewing a separately obtained history, performing a medically appropriate examination and/or evaluation, counseling and educating the patient/family/caregiver, ordering medications, tests, or procedures, documenting clinical information in the electronic health record, and independently interpreting results (not separately reported/billed) and communicating results to the patient/family/caregiver  Raechel Chute, MD [facility] 05/18/2023 9:51 AM    End of visit medications:  No orders of the defined types were placed in this encounter.    Current Outpatient Medications:    acalabrutinib maleate (CALQUENCE) 100 MG tablet, Take 1 tablet (100 mg total) by mouth 2 (two) times daily., Disp: 60 tablet, Rfl: 2   acetaminophen (TYLENOL) 500 MG tablet, Take 1,000 mg by mouth every 6 (six) hours as needed for mild pain. , Disp: , Rfl:    albuterol (VENTOLIN HFA) 108 (90 Base) MCG/ACT inhaler, 2 puffs inhaled orally every 4 to 6 hours as needed, not to exceed 12 puffs in 24 hours, Disp: 18 each, Rfl: 1   apixaban (ELIQUIS) 5 MG TABS tablet, TAKE 1 TABLET BY MOUTH TWICE A DAY, Disp: 60 tablet, Rfl: 5   benzonatate (TESSALON) 100 MG capsule, TAKE 1 CAPSULE BY MOUTH 2 TIMES DAILY AS NEEDED FOR COUGH., Disp: 40 capsule, Rfl: 0   carvedilol (COREG) 3.125 MG tablet,  TAKE 1 TABLET BY MOUTH TWICE A DAY, Disp: 180 tablet, Rfl: 1   feeding supplement (ENSURE ENLIVE / ENSURE PLUS) LIQD,  Take 237 mLs by mouth 3 (three) times daily between meals., Disp: 237 mL, Rfl: 12   fluticasone (FLONASE) 50 MCG/ACT nasal spray, PLACE 2 SPRAYS INTO BOTH NOSTRILS DAILY AS NEEDED FOR ALLERGIES., Disp: 48 mL, Rfl: 1   furosemide (LASIX) 40 MG tablet, TAKE 1 TABLET BY MOUTH EVERY DAY, Disp: 90 tablet, Rfl: 1   ipratropium-albuterol (DUONEB) 0.5-2.5 (3) MG/3ML SOLN, Take 3 mLs by nebulization every 6 (six) hours as needed., Disp: 300 mL, Rfl: 2   KLOR-CON M10 10 MEQ tablet, TAKE 1 TABLET BY MOUTH EVERY DAY, Disp: 90 tablet, Rfl: 1   losartan (COZAAR) 25 MG tablet, TAKE 1/2 TABLET BY MOUTH EVERY DAY, Disp: 45 tablet, Rfl: 0   MEGARED OMEGA-3 KRILL OIL PO, Take 1 tablet by mouth daily., Disp: , Rfl:    Multiple Vitamins-Minerals (CENTRUM SILVER 50+WOMEN) TABS, Take 1 tablet by mouth daily., Disp: , Rfl:    ondansetron (ZOFRAN) 8 MG tablet, ONE PILL EVERY 8 HOURS AS NEEDED FOR NAUSEA/VOMITTING., Disp: 40 tablet, Rfl: 1   OXYGEN, Inhale into the lungs. 2 Liters 24/7, Disp: , Rfl:    predniSONE (DELTASONE) 10 MG tablet, Take 40 mg po qam days 1-3, 30mg  po qam days 4-6, 20mg  po qam days 7-9 and 10mg  po qam days 10-12., Disp: 30 tablet, Rfl: 0   rosuvastatin (CRESTOR) 5 MG tablet, Take 1 tablet (5 mg total) by mouth daily., Disp: 90 tablet, Rfl: 3 No current facility-administered medications for this visit.  Facility-Administered Medications Ordered in Other Visits:    heparin lock flush 100 unit/mL, 500 Units, Intravenous, Once, Brahmanday, Govinda R, MD   sodium chloride flush (NS) 0.9 % injection 10 mL, 10 mL, Intravenous, Once, Brahmanday, Govinda R, MD   sodium chloride flush (NS) 0.9 % injection 10 mL, 10 mL, Intravenous, Once, Earna Coder, MD   Subjective:   PATIENT ID: Michelle [name] GENDER: female DOB: Oct 26, 1940, MRN: [id number]  Chief Complaint  Patient presents with   Follow-up    Cough, shortness of breath and wheezing.     HPI  Patient is a pleasant 82 year old female  with a history of multiple medical problems presenting to clinic today for follow up. She was previously followed by Dr. Thora Lance, last seen June of 2023. Dr. Thora Lance has since left our group and she would like to be seen locally in [place].   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 She was admitted with acute hypoxic respiratory failure to [facility] in March of 2023. She was found to have COVID with persistence of viral detection. She was treated with steroids, antibiotics and molnupravir. At that point, she was treated with Rituximab. She was also found to have an acute PE treated with Apixaban. She was discharged home on oxygen therapy with a prednisone taper and diuretics. She was re-admitted in May of 2023 again with worsening respiratory failure and diagnosed with multi-focal pneumonia. Patient again treated with antibiotics and steroids. COVID was still positive during said admission in May of 2023. Imaging during said admission noted underlying fibrotic lung disease (UIP pattern) with super imposed ground glass opacities. She was then seen by Dr. Lilian Kapur in clinic and prescribed a prolonged prednisone taper and repeat HRCT. Patient was last seen by me  in September of 2024. She was seen for an acute visit on 05/12/2023 by Dr. Belia Heman in our clinic due to bronchitis. She was prescribed prednisone by her PCP which she felt helped.  Presenting today for follow up. Feels much improved. She had symptoms of an URTI last week prompting a course of prednisone. She met with PCP and also saw Dr. Belia Heman for an acute visit. She has one more day in her prednisone course. She is reporting resolution of her cough and of her shortness of breath. She is yet to see rheumatology. She was unable to perform her PFT's, felt weak and was unable to cooperate with the process.  Patient is followed closely by  oncology for Mantle cell lymphoma. She follows with Dr. Donneta Romberg. She was last seen by him on 01/16/2023. Patient is maintained on Acalabrutinib. Rituximab had been discontinued for over a year now. She also follows with Dr. Okey Dupre from cardiology regarding NICM (EF as low as 30% in 2021, recovered to 60% in 2023) and is maintained on goal directed medical therapy. 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   Past medical history notable for NICM, HFpEF, OSA on CPAP, RA, GERD, and Stage IV mantle cell lymphoma. She reports being diagnosed with RA by Dr. Gavin Potters many years ago. There is no serology for RA evident in our records. She has not been seen by rheumatology for over 5 years. She is scheduled to see Rheumatology 06/30/2023.  Ancillary information including prior medications, full medical/surgical/family/social histories, and PFTs (when available) are listed below and have been reviewed.   Review of Systems  Constitutional:  Negative for chills, fever, malaise/fatigue and weight loss.  Respiratory:  Positive for shortness of breath. Negative for cough, hemoptysis, sputum production and wheezing.   Cardiovascular:  Negative for chest pain and palpitations.     Objective:   Vitals:   05/18/23 0837  BP: 106/76  Pulse: 64  Temp: 98.2 F (36.8 C)  TempSrc: Temporal  SpO2: 98%  Weight: 227 lb (103 kg)  Height: 5\' 7"  (1.702 m)   98% on RA BMI Readings from Last 3 Encounters:  05/18/23 35.55 kg/m  05/12/23 35.62 kg/m  04/20/23 33.15 kg/m   Wt Readings from Last 3 Encounters:  05/18/23 227 lb (103 kg)  05/12/23 227 lb 6.4 oz (103.1 kg)  04/20/23 218 lb (98.9 kg)    Physical Exam Constitutional:      Appearance: Normal appearance. She is obese.  Cardiovascular:     Rate and Rhythm: Normal rate and regular rhythm.     Pulses: Normal pulses.     Heart sounds: Normal heart sounds.  Pulmonary:     Effort: Pulmonary effort is normal.     Breath sounds: Rales  present. No wheezing.  Abdominal:     General: There is distension.     Palpations: Abdomen is soft.  Neurological:     General: No focal deficit present.     Mental Status: She is alert and oriented to person, place, and time. Mental status is at baseline.       Ancillary Information    Past Medical History:  Diagnosis Date   Arthritis    Collagen vascular disease (HCC)    GERD (gastroesophageal reflux disease)    Headache(784.0)    HFrEF (heart failure with reduced ejection fraction) (HCC)    Nonischemic cardiomyopathy, LVEF as low as 30-35% in 08/2019   History of methotrexate therapy    Hyperlipidemia  hx   Lymphadenopathy of head and neck 01/2015   see on Thyroid ultrasound   Lymphoma, mantle cell (HCC) 06/01/2015   bx of lymph node in right breast/Stage IV Mantle Cell Lymphoma   Multifocal pneumonia 10/05/2021   Personal history of chemotherapy    Pulmonary embolism (HCC) 10/05/2021   acute   Respiratory failure (HCC) 10/05/2021   acute   Rheumatoid arthritis (HCC)      Family History  Problem Relation Age of Onset   Diabetes Mother    Cholelithiasis Mother    Hypertension Sister    Diabetes Sister    Heart murmur Sister    Arthritis Brother    Breast cancer Neg Hx      Past Surgical History:  Procedure Laterality Date   BREAST BIOPSY Left 11/07/2020   u/s bx-hydromark #3 "coil"-path pending   CARDIAC CATHETERIZATION  09/2004   [facility]; EF 60%   CARDIAC CATHETERIZATION  08/2004   [facility]   IR FLUORO GUIDED NEEDLE PLC ASPIRATION/INJECTION LOC  06/18/2018   PERIPHERAL VASCULAR CATHETERIZATION N/A 07/04/2015   Procedure: Shelda Pal Cath Insertion;  Surgeon: Annice Needy, MD;  Location: [facility] INVASIVE CV LAB;  Service: Cardiovascular;  Laterality: N/A;   PORTA CATH REMOVAL N/A 06/23/2018   Procedure: PORTA CATH REMOVAL;  Surgeon: Annice Needy, MD;  Location: [facility] INVASIVE CV LAB;  Service: Cardiovascular;  Laterality: N/A;   RIGHT/LEFT HEART CATH AND CORONARY  ANGIOGRAPHY Bilateral 09/20/2019   Procedure: RIGHT/LEFT HEART CATH AND CORONARY ANGIOGRAPHY;  Surgeon: Yvonne Kendall, MD;  Location: [facility] INVASIVE CV LAB;  Service: Cardiovascular;  Laterality: Bilateral;    Social History   Socioeconomic History   Marital status: Married    Spouse name: Not on file   Number of children: Not on file   Years of education: Not on file   Highest education level: Not on file  Occupational History   Not on file  Tobacco Use   Smoking status: Never   Smokeless tobacco: Never  Vaping Use   Vaping status: Never Used  Substance and Sexual Activity   Alcohol use: No   Drug use: No   Sexual activity: Never  Other Topics Concern   Not on file  Social History Narrative   ** Merged History Encounter **       Lives in [place]. Works as Clinical biochemist.   Social Drivers of Corporate investment banker Strain: Low Risk  (12/03/2022)   Overall Financial Resource Strain (CARDIA)    Difficulty of Paying Living Expenses: Not hard at all  Food Insecurity: No Food Insecurity (12/03/2022)   Hunger Vital Sign    Worried About Running Out of Food in the Last Year: Never true    Ran Out of Food in the Last Year: Never true  Transportation Needs: No Transportation Needs (12/03/2022)   PRAPARE - Administrator, Civil Service (Medical): No    Lack of Transportation (Non-Medical): No  Physical Activity: Inactive (12/03/2022)   Exercise Vital Sign    Days of Exercise per Week: 0 days    Minutes of Exercise per Session: 0 min  Stress: No Stress Concern Present (12/03/2022)   [employer] of Occupational Health - Occupational Stress Questionnaire    Feeling of Stress : Not at all  Social Connections: Moderately Integrated (12/03/2022)   Social Connection and Isolation Panel [NHANES]    Frequency of Communication with Friends and Family: More than three times a week    Frequency of Social  Gatherings with Friends and Family: More than three times a week     Attends Religious Services: More than 4 times per year    Active Member of Clubs or Organizations: Yes    Attends Banker Meetings: More than 4 times per year    Marital Status: Widowed  Intimate Partner Violence: Not At Risk (12/03/2022)   Humiliation, Afraid, Rape, and Kick questionnaire    Fear of Current or Ex-Partner: No    Emotionally Abused: No    Physically Abused: No    Sexually Abused: No     No Known Allergies   CBC    Component Value Date/Time   WBC 8.1 03/20/2023 1021   WBC 8.4 08/01/2022 1006   RBC 4.19 03/20/2023 1021   HGB 12.5 03/20/2023 1021   HGB 13.4 09/08/2019 1359   HCT 39.9 03/20/2023 1021   HCT 40.9 09/08/2019 1359   PLT 198 03/20/2023 1021   PLT 194 09/08/2019 1359   MCV 95.2 03/20/2023 1021   MCV 90 09/08/2019 1359   MCV 89 09/14/2013 1127   MCH 29.8 03/20/2023 1021   MCHC 31.3 03/20/2023 1021   RDW 13.9 03/20/2023 1021   RDW 13.9 09/08/2019 1359   RDW 15.1 (H) 09/14/2013 1127   LYMPHSABS 1.2 03/20/2023 1021   LYMPHSABS 1.3 09/08/2019 1359   MONOABS 0.3 03/20/2023 1021   EOSABS 0.1 03/20/2023 1021   EOSABS 0.1 09/08/2019 1359   BASOSABS 0.0 03/20/2023 1021   BASOSABS 0.0 09/08/2019 1359    Pulmonary Functions Testing Results:    Latest Ref Rng & Units 11/19/2021    1:33 PM  PFT Results  FVC-Pre L 1.24   FVC-Predicted Pre % 50   FVC-Post L 1.27   FVC-Predicted Post % 52   Pre FEV1/FVC % % 82   Post FEV1/FCV % % 91   FEV1-Pre L 1.02   FEV1-Predicted Pre % 53   FEV1-Post L 1.15   TLC L 3.08   TLC % Predicted % 55   RV % Predicted % 61     Outpatient Medications Prior to Visit  Medication Sig Dispense Refill   acalabrutinib maleate (CALQUENCE) 100 MG tablet Take 1 tablet (100 mg total) by mouth 2 (two) times daily. 60 tablet 2   acetaminophen (TYLENOL) 500 MG tablet Take 1,000 mg by mouth every 6 (six) hours as needed for mild pain.      albuterol (VENTOLIN HFA) 108 (90 Base) MCG/ACT inhaler 2 puffs inhaled orally every  4 to 6 hours as needed, not to exceed 12 puffs in 24 hours 18 each 1   apixaban (ELIQUIS) 5 MG TABS tablet TAKE 1 TABLET BY MOUTH TWICE A DAY 60 tablet 5   benzonatate (TESSALON) 100 MG capsule TAKE 1 CAPSULE BY MOUTH 2 TIMES DAILY AS NEEDED FOR COUGH. 40 capsule 0   carvedilol (COREG) 3.125 MG tablet TAKE 1 TABLET BY MOUTH TWICE A DAY 180 tablet 1   feeding supplement (ENSURE ENLIVE / ENSURE PLUS) LIQD Take 237 mLs by mouth 3 (three) times daily between meals. 237 mL 12   fluticasone (FLONASE) 50 MCG/ACT nasal spray PLACE 2 SPRAYS INTO BOTH NOSTRILS DAILY AS NEEDED FOR ALLERGIES. 48 mL 1   furosemide (LASIX) 40 MG tablet TAKE 1 TABLET BY MOUTH EVERY DAY 90 tablet 1   ipratropium-albuterol (DUONEB) 0.5-2.5 (3) MG/3ML SOLN Take 3 mLs by nebulization every 6 (six) hours as needed. 300 mL 2   KLOR-CON M10 10 MEQ tablet TAKE 1 TABLET  BY MOUTH EVERY DAY 90 tablet 1   losartan (COZAAR) 25 MG tablet TAKE 1/2 TABLET BY MOUTH EVERY DAY 45 tablet 0   MEGARED OMEGA-3 KRILL OIL PO Take 1 tablet by mouth daily.     Multiple Vitamins-Minerals (CENTRUM SILVER 50+WOMEN) TABS Take 1 tablet by mouth daily.     ondansetron (ZOFRAN) 8 MG tablet ONE PILL EVERY 8 HOURS AS NEEDED FOR NAUSEA/VOMITTING. 40 tablet 1   OXYGEN Inhale into the lungs. 2 Liters 24/7     predniSONE (DELTASONE) 10 MG tablet Take 40 mg po qam days 1-3, 30mg  po qam days 4-6, 20mg  po qam days 7-9 and 10mg  po qam days 10-12. 30 tablet 0   rosuvastatin (CRESTOR) 5 MG tablet Take 1 tablet (5 mg total) by mouth daily. 90 tablet 3   Facility-Administered Medications Prior to Visit  Medication Dose Route Frequency Provider Last Rate Last Admin   heparin lock flush 100 unit/mL  500 Units Intravenous Once Louretta Shorten R, MD       sodium chloride flush (NS) 0.9 % injection 10 mL  10 mL Intravenous Once Louretta Shorten R, MD       sodium chloride flush (NS) 0.9 % injection 10 mL  10 mL Intravenous Once Earna Coder, MD

## 2023-05-18 NOTE — Patient Instructions (Signed)
We will await your visit with rheumatology ( I see you are scheduled to see Dr. Allena Katz at [facility] 06/29/2022) before deciding on next steps in terms of treatment for your lung condition. The PET scan showed stable appearing lungs compared to the prior CT.

## 2023-05-19 NOTE — Addendum Note (Signed)
Addended by: Lajoyce Lauber A on: 05/19/2023 10:30 AM   Modules accepted: Orders

## 2023-05-20 ENCOUNTER — Other Ambulatory Visit: Payer: Self-pay | Admitting: Internal Medicine

## 2023-05-20 ENCOUNTER — Ambulatory Visit: Payer: 59 | Attending: Internal Medicine | Admitting: Internal Medicine

## 2023-05-20 ENCOUNTER — Other Ambulatory Visit: Payer: Self-pay

## 2023-05-20 ENCOUNTER — Encounter: Payer: Self-pay | Admitting: Internal Medicine

## 2023-05-20 VITALS — BP 100/54 | HR 65 | Ht 67.0 in | Wt 230.4 lb

## 2023-05-20 DIAGNOSIS — J849 Interstitial pulmonary disease, unspecified: Secondary | ICD-10-CM | POA: Diagnosis not present

## 2023-05-20 DIAGNOSIS — Z86711 Personal history of pulmonary embolism: Secondary | ICD-10-CM

## 2023-05-20 DIAGNOSIS — J9611 Chronic respiratory failure with hypoxia: Secondary | ICD-10-CM

## 2023-05-20 DIAGNOSIS — I5022 Chronic systolic (congestive) heart failure: Secondary | ICD-10-CM | POA: Diagnosis not present

## 2023-05-20 LAB — PULMONARY FUNCTION TEST ARMC ONLY
FEF 25-75 Pre: 2.94 L/s
FEF2575-%Pred-Pre: 185 %
FEV1-%Pred-Pre: 39 %
FEV1-Pre: 0.89 L
FEV1FVC-%Pred-Pre: 135 %
FEV6-%Pred-Pre: 31 %
FEV6-Pre: 0.89 L
FEV6FVC-%Pred-Pre: 105 %
FVC-%Pred-Pre: 29 %
FVC-Pre: 0.89 L
Pre FEV1/FVC ratio: 100 %
Pre FEV6/FVC Ratio: 100 %

## 2023-05-20 NOTE — Progress Notes (Signed)
x

## 2023-05-20 NOTE — Patient Instructions (Signed)
Medication Instructions:  Your physician recommends that you continue on your current medications as directed. Please refer to the Current Medication list given to you today.   *If you need a refill on your cardiac medications before your next appointment, please call your pharmacy*   Lab Work: No labs ordered today    Testing/Procedures: Your physician has requested that you have an echocardiogram. Echocardiography is a painless test that uses sound waves to create images of your heart. It provides your doctor with information about the size and shape of your heart and how well your heart's chambers and valves are working.   You may receive an ultrasound enhancing agent through an IV if needed to better visualize your heart during the echo. This procedure takes approximately one hour.  There are no restrictions for this procedure.  This will take place at [address] [facility] [address], [place] [address]  Please note: We ask at that you not bring children with you during ultrasound (echo/ vascular) testing. Due to room size and safety concerns, children are not allowed in the ultrasound rooms during exams. Our front office staff cannot provide observation of children in our lobby area while testing is being conducted. An adult accompanying a patient to their appointment will only be allowed in the ultrasound room at the discretion of the ultrasound technician under special circumstances. We apologize for any inconvenience.    Follow-Up: At [facility], you and your health needs are our priority.  As part of our continuing mission to provide you with exceptional heart care, we have created designated Provider Care Teams.  These Care Teams include your primary Cardiologist (physician) and Advanced Practice Providers (APPs -  Physician Assistants and Nurse Practitioners) who all work together to provide you with the care you need, when you need it.  We recommend  signing up for the patient portal called "MyChart".  Sign up information is provided on this After Visit Summary.  MyChart is used to connect with patients for Virtual Visits (Telemedicine).  Patients are able to view lab/test results, encounter notes, upcoming appointments, etc.  Non-urgent messages can be sent to your provider as well.   To learn more about what you can do with MyChart, go to [email or web address].    Your next appointment:   6 month(s)  Provider:   You may see Yvonne Kendall, MD or one of the following Advanced Practice Providers on your designated Care Team:   Nicolasa Ducking, NP Eula Listen, PA-C Cadence Fransico Michael, PA-C Charlsie Quest, NP Carlos Levering, NP

## 2023-05-20 NOTE — Progress Notes (Signed)
Cardiology Office Note:  .   Date:  05/20/2023  ID:  Michelle [name], DOB [birth year], MRN [id number] PCP: Allegra Grana, FNP  [facility] Providers Cardiologist:  Yvonne Kendall, MD Electrophysiologist:  Sherryl Manges, MD     History of Present Illness: .   Michelle [name] is a 82 y.o. female with history of chronic HFrEF due to nonischemic cardiomyopathy with recovered ejection fraction (LVEF as low as 30-35% in 08/2019, most recently 60-65% by echo in 08/2021), pulmonary embolism, rheumatoid arthritis, mantle cell lymphoma, hyperlipidemia, and GERD, who presents for follow-up of nonischemic cardiomyopathy.  I last saw her in June, which time she reported feeling well though a family member accompanying her was concerned about intermittent cough with clear sputum production.  She is subsequently saw Dr. Donneta Romberg; we agreed to complete 1 year of apixaban and then transition to aspirin alone.  However, she remains on apixaban.  Today, Michelle [name] reports that her breathing has not been all that good recently.  She has been evaluated by two pulmonologist and recently completed a course of prednisone.  She notes that her weight has been up over the last few weeks, which she attributes to prednisone use and dietary indiscretion.  She does not believe that she is holding onto any extra fluid.  She denies orthopnea as well as chest pain, palpitations, lightheadedness, and edema.  She has not had any bleeding.  ROS: See HPI  Studies Reviewed: [name]   EKG Interpretation Date/Time:  Wednesday May 20 2023 10:12:03 EST Ventricular Rate:  65 PR Interval:  128 QRS Duration:  130 QT Interval:  432 QTC Calculation: 449 R Axis:   -42  Text Interpretation: Normal sinus rhythm Left axis deviation Left bundle branch block When compared with ECG of 07-Nov-2022 No significant change was found Confirmed by Michelle [name] [id number]) on 05/20/2023 10:20:12 AM    TTE (06/04/2022):  1. Left  ventricular ejection fraction, by estimation, is 45 to 50%. The  left ventricle has mildly decreased function. The left ventricle  demonstrates global hypokinesis. Left ventricular diastolic parameters are  consistent with Grade I diastolic  dysfunction (impaired relaxation).   2. Right ventricular systolic function is normal. The right ventricular  size is normal.   3. The mitral valve is normal in structure. Mild mitral valve  regurgitation.   4. The aortic valve is tricuspid. Aortic valve regurgitation is not  visualized.   5. Aortic dilatation noted. There is mild dilatation of the ascending  aorta, measuring 41 mm.   6. The inferior vena cava is normal in size with greater than 50%  respiratory variability, suggesting right atrial pressure of 3 mmHg.   Risk Assessment/Calculations:             Physical Exam:   VS:  BP (!) 100/54 (BP Location: Left Arm, Patient Position: Sitting, Cuff Size: Normal)   Pulse 65   Ht 5\' 7"  (1.702 m)   Wt 230 lb 6 oz (104.5 kg)   SpO2 97%   BMI 36.08 kg/m    Wt Readings from Last 3 Encounters:  05/20/23 230 lb 6 oz (104.5 kg)  05/18/23 227 lb (103 kg)  05/12/23 227 lb 6.4 oz (103.1 kg)    General:  NAD. Neck: No JVD or HJR. Lungs: Coarse breath sounds at lung bases.  Fair air movement. Heart: Regular rate and rhythm without murmurs, rubs, or gallops. Abdomen: Soft, nontender, nondistended. Extremities: Trace ankle edema bilaterally.  ASSESSMENT AND PLAN: .  Chronic respiratory failure with hypoxia, interstitial lung disease, and chronic HFrEF: Michelle [name] has noted some increased shortness of breath over the last Avril weeks, being managed by her pulmonologist.  She notes some weight gain since November, though most of her weights are similar to today's.  She does not have any significant edema on exam today.  We will continue with furosemide 40 mg daily.  We will repeat an echocardiogram at her convenience to ensure that her LVEF has not  declined further.  Continue current GDMT consisting of low-dose carvedilol and losartan.  Soft blood pressure precludes further escalation at this time.  Continue close follow-up with pulmonology.  History of pulmonary embolism: Michelle [name] was to have completed 1 year of anticoagulation in June, with previous plan to transition to aspirin.  However, she remains on apixaban with recommendations to continue this and the most recent note by her pulmonologist.  I will reach out to Drs. Brahmanday and Dgayli to clarify her anticoagulation.  Continue apixaban for now.    Dispo: Return to clinic in 6 months.  Signed, Yvonne Kendall, MD

## 2023-05-20 NOTE — Progress Notes (Signed)
Specialty Pharmacy Refill Coordination Note  Michelle [name] is a 82 y.o. female contacted today regarding refills of specialty medication(s) Acalabrutinib Maleate (Calquence)   Patient requested Delivery   Delivery date: 05/29/23   Verified address: [address]   [name] [address]   Medication will be filled on 12.26.24.

## 2023-05-22 ENCOUNTER — Encounter: Payer: Self-pay | Admitting: Internal Medicine

## 2023-05-22 ENCOUNTER — Inpatient Hospital Stay: Payer: 59 | Attending: Internal Medicine

## 2023-05-22 ENCOUNTER — Inpatient Hospital Stay: Payer: 59 | Admitting: Internal Medicine

## 2023-05-22 VITALS — BP 105/61 | HR 69 | Temp 98.2°F | Wt 227.0 lb

## 2023-05-22 DIAGNOSIS — C8311 Mantle cell lymphoma, lymph nodes of head, face, and neck: Secondary | ICD-10-CM

## 2023-05-22 DIAGNOSIS — Z79899 Other long term (current) drug therapy: Secondary | ICD-10-CM | POA: Diagnosis not present

## 2023-05-22 DIAGNOSIS — Z7901 Long term (current) use of anticoagulants: Secondary | ICD-10-CM | POA: Insufficient documentation

## 2023-05-22 DIAGNOSIS — Z86711 Personal history of pulmonary embolism: Secondary | ICD-10-CM | POA: Diagnosis not present

## 2023-05-22 DIAGNOSIS — I428 Other cardiomyopathies: Secondary | ICD-10-CM | POA: Diagnosis not present

## 2023-05-22 DIAGNOSIS — Z993 Dependence on wheelchair: Secondary | ICD-10-CM | POA: Diagnosis not present

## 2023-05-22 DIAGNOSIS — M069 Rheumatoid arthritis, unspecified: Secondary | ICD-10-CM | POA: Diagnosis not present

## 2023-05-22 DIAGNOSIS — I272 Pulmonary hypertension, unspecified: Secondary | ICD-10-CM | POA: Insufficient documentation

## 2023-05-22 LAB — CBC WITH DIFFERENTIAL/PLATELET
Abs Immature Granulocytes: 0.07 10*3/uL (ref 0.00–0.07)
Basophils Absolute: 0 10*3/uL (ref 0.0–0.1)
Basophils Relative: 0 %
Eosinophils Absolute: 0.1 10*3/uL (ref 0.0–0.5)
Eosinophils Relative: 1 %
HCT: 39.6 % (ref 36.0–46.0)
Hemoglobin: 12.5 g/dL (ref 12.0–15.0)
Immature Granulocytes: 1 %
Lymphocytes Relative: 11 %
Lymphs Abs: 1.1 10*3/uL (ref 0.7–4.0)
MCH: 30.3 pg (ref 26.0–34.0)
MCHC: 31.6 g/dL (ref 30.0–36.0)
MCV: 95.9 fL (ref 80.0–100.0)
Monocytes Absolute: 0.5 10*3/uL (ref 0.1–1.0)
Monocytes Relative: 5 %
Neutro Abs: 8.7 10*3/uL — ABNORMAL HIGH (ref 1.7–7.7)
Neutrophils Relative %: 82 %
Platelets: 213 10*3/uL (ref 150–400)
RBC: 4.13 MIL/uL (ref 3.87–5.11)
RDW: 15.7 % — ABNORMAL HIGH (ref 11.5–15.5)
WBC: 10.5 10*3/uL (ref 4.0–10.5)
nRBC: 0 % (ref 0.0–0.2)

## 2023-05-22 LAB — COMPREHENSIVE METABOLIC PANEL
ALT: 15 U/L (ref 0–44)
AST: 20 U/L (ref 15–41)
Albumin: 3.7 g/dL (ref 3.5–5.0)
Alkaline Phosphatase: 56 U/L (ref 38–126)
Anion gap: 10 (ref 5–15)
BUN: 14 mg/dL (ref 8–23)
CO2: 26 mmol/L (ref 22–32)
Calcium: 9 mg/dL (ref 8.9–10.3)
Chloride: 100 mmol/L (ref 98–111)
Creatinine, Ser: 0.85 mg/dL (ref 0.44–1.00)
GFR, Estimated: >60 mL/min (ref >60)
Glucose, Bld: 94 mg/dL (ref 70–99)
Potassium: 3.7 mmol/L (ref 3.5–5.1)
Sodium: 136 mmol/L (ref 135–145)
Total Bilirubin: 0.5 mg/dL (ref <1.2)
Total Protein: 6.5 g/dL (ref 6.5–8.1)

## 2023-05-22 LAB — LACTATE DEHYDROGENASE: LDH: 152 U/L (ref 98–192)

## 2023-05-22 LAB — VITAMIN D 25 HYDROXY (VIT D DEFICIENCY, FRACTURES): Vit D, 25-Hydroxy: 43.78 ng/mL (ref 30–100)

## 2023-05-22 LAB — BRAIN NATRIURETIC PEPTIDE: B Natriuretic Peptide: 78.3 pg/mL (ref 0.0–100.0)

## 2023-05-22 NOTE — Assessment & Plan Note (Addendum)
#  2016-2017- Mantle cell lymphoma recurrent biopsy-proven [July 2022]; Currently on Calquence [ DISCONTINUED  Rituximab-given the multifocal pneumonia]-  PET scan NOV 11th, 2024- Stable PET-CT. No findings for recurrent lymphoma.     #Currently on Calequence 100 mg twice a day [ AUG 1st, 2022].  Tolerating well.  Stable.   # March 31st, 2023- right upper lobe pulmonary artery branches  consistent with pulmonary embolism- on eliquis [until dec 2024]-  will discontinue eliquis today.  Discussed with pulmonary and also cardiology.  # Immunocompromised state: Hx Multifocal pneumonia-[April 2023]-Given the multiple pneumonias/high risk of death-recommended continuation of further rituximab. MARCH 2024- IgG- 800.  Monitor for now. Stable.  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 Continue lasix; refilled. Stable.  #Chronic respiratory failure -2 L home O2 -NICMP/pulmonary hypertension/chronic bilateral lung scarring-followed by pulmonary [Dr.Meier.[place]]; MAY 2024- HRCT-  Pulmonary parenchymal pattern of fibrosis, with interval resolution of previously seen superimposed ground-glass- consistent with UIP. S/p pulmonary - sep, 2024  Dr.Dgyali re: pul HTN/ILD.   # Hx of Rheumatoid arthritis-given ILD-  awaiting referral to Rheumatology.   # Left shoulder pain/ Chronic arthritis-on meloxicam as needed- stable.  #Vaccinations : s/p flu shot- today; discussed r: COVID booster/RSV; ? Pneumonia vaccination [check with PCP]  # DISPOSITION:  # follow up-MD in 2 months -/friday  MD ;labs- cbc/cmp/ldh/BNP; quantitative immunoglobulin]-vit D 25-OH.Dr.B

## 2023-05-22 NOTE — Progress Notes (Signed)
[facility] OFFICE PROGRESS NOTE  Patient Care Team: Allegra Grana, FNP as PCP - General (Family Medicine) End, Cristal Deer, MD as PCP - Cardiology (Cardiology) Duke Salvia, MD as PCP - Electrophysiology (Cardiology) Shelia Media, MD (Internal Medicine) Earna Coder, MD as Consulting Physician (Internal Medicine)   Cancer Staging  No matching staging information was found for the patient.    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 Feb 9th- START Benda-Ritux with neulasta; Prolonged neutropenia; DISCONT- Benda-Ritux;   # April 13 th 2017- START R-CHOP x1; severe/prolonged neutropenia; PET- CR; BMBx-Neg; Disc R-CHOP # June 2022- DIAGNOSIS: thickened cortex of 12 mm. An additional lymph node demonstrates a thickened cortex of 7 mm. A third lymph node demonstrates a mildly thickened cortex of 4 mm A. LYMPH NODE, LEFT AXILLA; ULTRASOUND-GUIDED BIOPSY:  - CD5+ MONOCLONAL B-CELL POPULATION; COMPATIBLE WITH INVOLVEMENT BY THE  PATIENT'S KNOWN MANTLE CELL LYMPHOMA.   #July 2022-second week-started ibrutinib 420 mg a dayx 1 week-stop because of severe rash.  Significant clinical response noted.  # # 26th MAY 2017- Start Rituxan q 39M Main OCT 12th 2017- PET NED.    # AUG 1st, 2022- start acalbrutinib. 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 Ambulating in a wheel chair.   [name] 82 y.o.  female pleasant patient multiple medical problems-ischemic cardiomyopathy; pulm hypertension; Hx of PE on eliquis and above history of recurrent mantle cell lymphoma on Calquence; here for follow-up.  No further hospitalizations.  Appetite about half of what it used to be. No pain today. Usually has arthritis pain in her bil knees and lower back. Denies night sweats. No fevers .Energy is okay.   Patient admits compliance to Calquence.  Denies any headaches. Patient continues to be on 2 L of oxygen but as needed.    No nausea no vomiting.  No diarrhea.  No chest pain no new lumps or bumps.  Review of Systems  Constitutional:  Negative for chills, diaphoresis, fever, malaise/fatigue and weight loss.  HENT:  Negative for nosebleeds and sore throat.   Eyes:  Negative for double vision.  Respiratory:  Negative for hemoptysis, sputum production and wheezing.   Cardiovascular:  Negative for chest pain, palpitations, orthopnea and leg swelling.  Gastrointestinal:  Positive for constipation. Negative for abdominal pain, blood in stool, diarrhea, heartburn, melena, nausea and vomiting.  Genitourinary:  Negative for dysuria, frequency and urgency.  Musculoskeletal:  Positive for back pain and joint pain.  Skin: Negative.  Negative for itching and rash.  Neurological:  Negative for dizziness, tingling, focal weakness, weakness and headaches.  Endo/Heme/Allergies:  Does not bruise/bleed easily.  Psychiatric/Behavioral:  Negative for depression. The patient is not nervous/anxious and does not have insomnia.     PAST MEDICAL  HISTORY :  Past Medical History:  Diagnosis Date   Arthritis    Collagen vascular disease (HCC)    GERD (gastroesophageal reflux disease)    Headache(784.0)    HFrEF (heart failure with reduced  ejection fraction) (HCC)    Nonischemic cardiomyopathy, LVEF as low as 30-35% in 08/2019   History of methotrexate therapy    Hyperlipidemia    hx   Lymphadenopathy of head and neck 01/2015   see on Thyroid ultrasound   Lymphoma, mantle cell (HCC) 06/01/2015   bx of lymph node in right breast/Stage IV Mantle Cell Lymphoma   Multifocal pneumonia 10/05/2021   Personal history of chemotherapy    Pulmonary embolism (HCC) 10/05/2021   acute   Respiratory failure (HCC) 10/05/2021   acute   Rheumatoid arthritis (HCC)     PAST SURGICAL HISTORY :   Past Surgical History:  Procedure Laterality Date   BREAST BIOPSY Left 11/07/2020   u/s bx-hydromark #3 "coil"-path pending   CARDIAC CATHETERIZATION  09/2004   [facility]; EF 60%   CARDIAC CATHETERIZATION  08/2004   [facility]   IR FLUORO GUIDED NEEDLE PLC ASPIRATION/INJECTION LOC  06/18/2018   PERIPHERAL VASCULAR CATHETERIZATION N/A 07/04/2015   Procedure: Shelda Pal Cath Insertion;  Surgeon: Annice Needy, MD;  Location: [facility] INVASIVE CV LAB;  Service: Cardiovascular;  Laterality: N/A;   PORTA CATH REMOVAL N/A 06/23/2018   Procedure: PORTA CATH REMOVAL;  Surgeon: Annice Needy, MD;  Location: [facility] INVASIVE CV LAB;  Service: Cardiovascular;  Laterality: N/A;   RIGHT/LEFT HEART CATH AND CORONARY ANGIOGRAPHY Bilateral 09/20/2019   Procedure: RIGHT/LEFT HEART CATH AND CORONARY ANGIOGRAPHY;  Surgeon: Yvonne Kendall, MD;  Location: [facility] INVASIVE CV LAB;  Service: Cardiovascular;  Laterality: Bilateral;    FAMILY HISTORY :   Family History  Problem Relation Age of Onset   Diabetes Mother    Cholelithiasis Mother    Hypertension Sister    Diabetes Sister    Heart murmur Sister    Arthritis Brother    Breast cancer Neg Hx     SOCIAL HISTORY:   Social History   Tobacco Use   Smoking status: Never   Smokeless tobacco: Never  Vaping Use   Vaping status: Never Used  Substance Use Topics   Alcohol use: No   Drug use: No    ALLERGIES:  has no known  allergies.  MEDICATIONS:  Current Outpatient Medications  Medication Sig Dispense Refill   acalabrutinib maleate (CALQUENCE) 100 MG tablet Take 1 tablet (100 mg total) by mouth 2 (two) times daily. 60 tablet 2   acetaminophen (TYLENOL) 500 MG tablet Take 1,000 mg by mouth every 6 (six) hours as needed for mild pain.      albuterol (VENTOLIN HFA) 108 (90 Base) MCG/ACT inhaler 2 puffs inhaled orally every 4 to 6 hours as needed, not to exceed 12 puffs in 24 hours 18 each 1   apixaban (ELIQUIS) 5 MG TABS tablet TAKE 1 TABLET BY MOUTH TWICE A DAY 60 tablet 5   carvedilol (COREG) 3.125 MG tablet TAKE 1 TABLET BY MOUTH TWICE A DAY 180 tablet 1   feeding supplement (ENSURE ENLIVE / ENSURE PLUS) LIQD Take 237 mLs by mouth 3 (three) times daily between meals. 237 mL 12   fluticasone (FLONASE) 50 MCG/ACT nasal spray PLACE 2 SPRAYS INTO BOTH NOSTRILS DAILY AS NEEDED FOR ALLERGIES. 48 mL 1   furosemide (LASIX) 40 MG tablet TAKE 1 TABLET BY MOUTH EVERY DAY 90 tablet 1   ipratropium-albuterol (DUONEB)  0.5-2.5 (3) MG/3ML SOLN Take 3 mLs by nebulization every 6 (six) hours as needed. 300 mL 2   KLOR-CON M10 10 MEQ tablet TAKE 1 TABLET BY MOUTH EVERY DAY 90 tablet 1   losartan (COZAAR) 25 MG tablet TAKE 1/2 TABLET BY MOUTH EVERY DAY 45 tablet 0   MEGARED OMEGA-3 KRILL OIL PO Take 1 tablet by mouth daily.     Multiple Vitamins-Minerals (CENTRUM SILVER 50+WOMEN) TABS Take 1 tablet by mouth daily.     ondansetron (ZOFRAN) 8 MG tablet ONE PILL EVERY 8 HOURS AS NEEDED FOR NAUSEA/VOMITTING. 40 tablet 1   OXYGEN Inhale into the lungs. 2 Liters 24/7     predniSONE (DELTASONE) 10 MG tablet Take 40 mg po qam days 1-3, 30mg  po qam days 4-6, 20mg  po qam days 7-9 and 10mg  po qam days 10-12. 30 tablet 0   rosuvastatin (CRESTOR) 5 MG tablet Take 1 tablet (5 mg total) by mouth daily. 90 tablet 3   benzonatate (TESSALON) 100 MG capsule TAKE 1 CAPSULE BY MOUTH 2 TIMES DAILY AS NEEDED FOR COUGH. (Patient not taking: Reported on  05/22/2023) 40 capsule 0   No current facility-administered medications for this visit.   Facility-Administered Medications Ordered in Other Visits  Medication Dose Route Frequency Provider Last Rate Last Admin   heparin lock flush 100 unit/mL  500 Units Intravenous Once Louretta Shorten R, MD       sodium chloride flush (NS) 0.9 % injection 10 mL  10 mL Intravenous Once Louretta Shorten R, MD       sodium chloride flush (NS) 0.9 % injection 10 mL  10 mL Intravenous Once Earna Coder, MD        PHYSICAL EXAMINATION: ECOG PERFORMANCE STATUS: 0 - Asymptomatic  BP 105/61 (BP Location: Left Arm, Patient Position: Sitting)   Pulse 69   Temp 98.2 F (36.8 C) (Tympanic)   Wt 227 lb (103 kg)   SpO2 100%   BMI 35.55 kg/m   Filed Weights   05/22/23 0959  Weight: 227 lb (103 kg)      Physical Exam HENT:     Head: Normocephalic and atraumatic.     Mouth/Throat:     Pharynx: No oropharyngeal exudate.  Eyes:     Pupils: Pupils are equal, round, and reactive to light.  Cardiovascular:     Rate and Rhythm: Normal rate and regular rhythm.  Pulmonary:     Effort: No respiratory distress.     Breath sounds: No wheezing.     Comments: Bilateral basilar crackles Abdominal:     General: Bowel sounds are normal. There is no distension.     Palpations: Abdomen is soft. There is no mass.     Tenderness: There is no abdominal tenderness. There is no guarding or rebound.  Musculoskeletal:        General: No tenderness. Normal range of motion.     Cervical back: Normal range of motion and neck supple.  Skin:    General: Skin is warm.  Neurological:     Mental Status: She is alert and oriented to person, place, and time.  Psychiatric:        Mood and Affect: Affect normal.    LABORATORY DATA:  I have reviewed the data as listed    Component Value Date/Time   NA 136 05/22/2023 0932   NA 145 (H) 09/08/2019 1359   NA 142 09/14/2013 1127   K 3.7 05/22/2023 0932   K 3.5  09/14/2013 1127  CL 100 05/22/2023 0932   CL 110 (H) 09/14/2013 1127   CO2 26 05/22/2023 0932   CO2 29 09/14/2013 1127   GLUCOSE 94 05/22/2023 0932   GLUCOSE 106 (H) 09/14/2013 1127   BUN 14 05/22/2023 0932   BUN 15 09/08/2019 1359   BUN 8 09/14/2013 1127   CREATININE 0.85 05/22/2023 0932   CREATININE 0.83 03/20/2023 1021   CREATININE 0.71 09/14/2013 1127   CREATININE 0.68 04/01/2013 1550   CALCIUM 9.0 05/22/2023 0932   CALCIUM 8.6 09/14/2013 1127   PROT 6.5 05/22/2023 0932   PROT 7.6 09/14/2013 1127   ALBUMIN 3.7 05/22/2023 0932   ALBUMIN 3.2 (L) 09/14/2013 1127   AST 20 05/22/2023 0932   AST 21 03/20/2023 1021   ALT 15 05/22/2023 0932   ALT 13 03/20/2023 1021   ALT 19 09/14/2013 1127   ALKPHOS 56 05/22/2023 0932   ALKPHOS 76 09/14/2013 1127   BILITOT 0.5 05/22/2023 0932   BILITOT 0.7 03/20/2023 1021   GFRNONAA >60 05/22/2023 0932   GFRNONAA >60 03/20/2023 1021   GFRNONAA >60 09/14/2013 1127   GFRAA >60 01/20/2020 1303   GFRAA >60 09/14/2013 1127    No results found for: "SPEP", "UPEP"  Lab Results  Component Value Date   WBC 10.5 05/22/2023   NEUTROABS 8.7 (H) 05/22/2023   HGB 12.5 05/22/2023   HCT 39.6 05/22/2023   MCV 95.9 05/22/2023   PLT 213 05/22/2023      Chemistry      Component Value Date/Time   NA 136 05/22/2023 0932   NA 145 (H) 09/08/2019 1359   NA 142 09/14/2013 1127   K 3.7 05/22/2023 0932   K 3.5 09/14/2013 1127   CL 100 05/22/2023 0932   CL 110 (H) 09/14/2013 1127   CO2 26 05/22/2023 0932   CO2 29 09/14/2013 1127   BUN 14 05/22/2023 0932   BUN 15 09/08/2019 1359   BUN 8 09/14/2013 1127   CREATININE 0.85 05/22/2023 0932   CREATININE 0.83 03/20/2023 1021   CREATININE 0.71 09/14/2013 1127   CREATININE 0.68 04/01/2013 1550      Component Value Date/Time   CALCIUM 9.0 05/22/2023 0932   CALCIUM 8.6 09/14/2013 1127   ALKPHOS 56 05/22/2023 0932   ALKPHOS 76 09/14/2013 1127   AST 20 05/22/2023 0932   AST 21 03/20/2023 1021   ALT 15  05/22/2023 0932   ALT 13 03/20/2023 1021   ALT 19 09/14/2013 1127   BILITOT 0.5 05/22/2023 0932   BILITOT 0.7 03/20/2023 1021       RADIOGRAPHIC STUDIES: I have personally reviewed the radiological images as listed and agreed with the findings in the report. No results found.   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 No findings for recurrent lymphoma.     #Currently on Calequence 100 mg twice a day [ AUG 1st, 2022].  Tolerating well.  Stable.   # March 31st, 2023- right upper lobe pulmonary artery branches  consistent with pulmonary embolism- on eliquis [until dec 2024]-  will discontinue eliquis today.  Discussed with pulmonary and also cardiology.  # Immunocompromised state: Hx Multifocal pneumonia-[April 2023]-Given the multiple pneumonias/high risk of death-recommended continuation of further rituximab. MARCH 2024- IgG- 800.  Monitor for now. Stable.  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 Continue lasix; refilled. Stable.  #Chronic respiratory failure -2 L home O2 -NICMP/pulmonary hypertension/chronic bilateral lung scarring-followed by pulmonary [Dr.Meier.[place]]; MAY 2024- HRCT-  Pulmonary parenchymal pattern of fibrosis, with interval resolution of previously seen superimposed ground-glass- consistent with UIP. S/p pulmonary - sep, 2024  Dr.Dgyali re: pul HTN/ILD.   # Hx of Rheumatoid arthritis-given ILD-  awaiting referral to Rheumatology.   # Left shoulder pain/ Chronic arthritis-on meloxicam as needed- stable.  #Vaccinations : s/p flu shot- today; discussed r: COVID booster/RSV; ?  Pneumonia vaccination [check with PCP]  # DISPOSITION:  # follow up-MD in 2 months -/friday  MD ;labs- cbc/cmp/ldh/BNP; quantitative immunoglobulin]-vit D 25-OH.Dr.B   Orders Placed This Encounter  Procedures   CBC with Differential (Cancer Center Only)    Standing Status:   Future    Expected Date:   07/23/2023    Expiration Date:   05/21/2024   Chromogranin A    Standing Status:   Future    Expected Date:   07/23/2023    Expiration Date:   05/21/2024   Lactate dehydrogenase    Standing Status:   Future    Expected Date:   07/23/2023    Expiration Date:   05/21/2024   Brain natriuretic peptide    Standing Status:   Future    Expected Date:   07/23/2023    Expiration Date:   05/21/2024   Immunoglobulins, QN, A/E/G/M    Standing Status:   Future    Expected Date:   07/23/2023    Expiration Date:   05/21/2024   VITAMIN D 25 Hydroxy (Vit-D Deficiency, Fractures)    Standing Status:   Future    Expected Date:   07/23/2023    Expiration Date:   05/21/2024     All questions were answered. The patient knows to call the clinic with any problems, questions or concerns.      Earna Coder, MD 05/22/2023 11:10 AM

## 2023-05-24 DIAGNOSIS — G4733 Obstructive sleep apnea (adult) (pediatric): Secondary | ICD-10-CM | POA: Diagnosis not present

## 2023-05-24 DIAGNOSIS — I2699 Other pulmonary embolism without acute cor pulmonale: Secondary | ICD-10-CM | POA: Diagnosis not present

## 2023-05-24 DIAGNOSIS — J9601 Acute respiratory failure with hypoxia: Secondary | ICD-10-CM | POA: Diagnosis not present

## 2023-05-26 LAB — IMMUNOGLOBULINS A/E/G/M, SERUM
IgA: 94 mg/dL (ref 64–422)
IgE (Immunoglobulin E), Serum: <2 [IU]/mL — ABNORMAL LOW (ref 6–495)
IgG (Immunoglobin G), Serum: 809 mg/dL (ref 586–1602)
IgM (Immunoglobulin M), Srm: 11 mg/dL — ABNORMAL LOW (ref 26–217)

## 2023-06-12 ENCOUNTER — Ambulatory Visit: Payer: 59

## 2023-06-19 ENCOUNTER — Other Ambulatory Visit: Payer: Self-pay

## 2023-06-19 NOTE — Progress Notes (Signed)
Specialty Pharmacy Refill Coordination Note  DARSHANA BROOMFIELD is a 83 y.o. female contacted today regarding refills of specialty medication(s) Acalabrutinib Maleate (Calquence)   Patient requested Delivery   Delivery date: 06/24/23   Verified address: [address]   [place] [address]   Medication will be filled on 06/23/23.

## 2023-06-24 DIAGNOSIS — I2699 Other pulmonary embolism without acute cor pulmonale: Secondary | ICD-10-CM | POA: Diagnosis not present

## 2023-06-24 DIAGNOSIS — J9601 Acute respiratory failure with hypoxia: Secondary | ICD-10-CM | POA: Diagnosis not present

## 2023-06-24 DIAGNOSIS — G4733 Obstructive sleep apnea (adult) (pediatric): Secondary | ICD-10-CM | POA: Diagnosis not present

## 2023-06-26 ENCOUNTER — Ambulatory Visit: Payer: 59 | Attending: Internal Medicine

## 2023-06-26 DIAGNOSIS — I5022 Chronic systolic (congestive) heart failure: Secondary | ICD-10-CM

## 2023-06-26 DIAGNOSIS — J9611 Chronic respiratory failure with hypoxia: Secondary | ICD-10-CM | POA: Diagnosis not present

## 2023-06-26 LAB — ECHOCARDIOGRAM COMPLETE
AR max vel: 2.06 cm2
AV Area VTI: 2.03 cm2
AV Area mean vel: 1.93 cm2
AV Mean grad: 5 mm[Hg]
AV Peak grad: 9.5 mm[Hg]
Ao pk vel: 1.54 m/s
Area-P 1/2: 2.87 cm2
Calc EF: 48.1 %
S' Lateral: 4.5 cm
Single Plane A2C EF: 48.9 %
Single Plane A4C EF: 47.9 %

## 2023-07-08 ENCOUNTER — Telehealth: Payer: Self-pay | Admitting: Student in an Organized Health Care Education/Training Program

## 2023-07-08 NOTE — Telephone Encounter (Signed)
 Michelle [name] states needs office notes and labs for referral. Michelle [name] phone number is [phone]. Fax number is [phone].

## 2023-07-10 NOTE — Telephone Encounter (Signed)
 Dr. Bascom Bossier placed the referral to Rheumatology and they should be sending the records

## 2023-07-14 ENCOUNTER — Other Ambulatory Visit (HOSPITAL_COMMUNITY): Payer: Self-pay

## 2023-07-14 NOTE — Telephone Encounter (Signed)
please send my office visit note from 04/20/2023 and also pulmonology office visit note 05/18/2023 to [facility] so patient can be scheduled

## 2023-07-15 NOTE — Telephone Encounter (Signed)
Since everyone seems to think Dr. Belia Heman made there Rheumatology I have printed the notes and faxed them to [facility]

## 2023-07-15 NOTE — Telephone Encounter (Signed)
Megan states Dr. Belia Heman referred the patient to Rheumatology. Megan phone number is [phone].

## 2023-07-17 ENCOUNTER — Other Ambulatory Visit (HOSPITAL_COMMUNITY): Payer: Self-pay

## 2023-07-20 ENCOUNTER — Other Ambulatory Visit: Payer: Self-pay | Admitting: Internal Medicine

## 2023-07-20 ENCOUNTER — Other Ambulatory Visit: Payer: Self-pay

## 2023-07-20 DIAGNOSIS — C8311 Mantle cell lymphoma, lymph nodes of head, face, and neck: Secondary | ICD-10-CM

## 2023-07-20 NOTE — Progress Notes (Signed)
 Specialty Pharmacy Refill Coordination Note  Michelle [name] is a 83 y.o. female contacted today regarding refills of specialty medication(s) Acalabrutinib Maleate (Calquence)   Patient requested Delivery   Delivery date: 07/27/23   Verified address: [address]   [place] [address]   Medication will be filled on 02//21/25.   This fill date is pending response to refill request from provider. Patient is aware and if they have not received fill by intended date they must follow up with pharmacy.

## 2023-07-22 ENCOUNTER — Other Ambulatory Visit: Payer: Self-pay | Admitting: Internal Medicine

## 2023-07-22 ENCOUNTER — Ambulatory Visit: Payer: 59 | Admitting: Student in an Organized Health Care Education/Training Program

## 2023-07-22 ENCOUNTER — Other Ambulatory Visit (HOSPITAL_COMMUNITY): Payer: Self-pay

## 2023-07-22 ENCOUNTER — Other Ambulatory Visit: Payer: Self-pay

## 2023-07-22 DIAGNOSIS — E876 Hypokalemia: Secondary | ICD-10-CM

## 2023-07-22 MED ORDER — CALQUENCE 100 MG PO TABS
100.0000 mg | ORAL_TABLET | Freq: Two times a day (BID) | ORAL | 2 refills | Status: DC
  Filled 2023-07-22: qty 60, 30d supply, fill #0
  Filled 2023-08-21 (×2): qty 60, 30d supply, fill #1
  Filled 2023-10-07: qty 60, 30d supply, fill #2

## 2023-07-23 ENCOUNTER — Telehealth: Payer: Self-pay | Admitting: *Deleted

## 2023-07-23 NOTE — Telephone Encounter (Signed)
 Pt has appt for tom.  [place] and [place]  cancel tom . Appt and send her another appt like next week or so. Please call her for the new appt

## 2023-07-24 ENCOUNTER — Inpatient Hospital Stay: Payer: 59

## 2023-07-24 ENCOUNTER — Inpatient Hospital Stay: Payer: 59 | Admitting: Internal Medicine

## 2023-07-24 ENCOUNTER — Other Ambulatory Visit: Payer: Self-pay

## 2023-07-25 DIAGNOSIS — G4733 Obstructive sleep apnea (adult) (pediatric): Secondary | ICD-10-CM | POA: Diagnosis not present

## 2023-07-25 DIAGNOSIS — I2699 Other pulmonary embolism without acute cor pulmonale: Secondary | ICD-10-CM | POA: Diagnosis not present

## 2023-07-25 DIAGNOSIS — J9601 Acute respiratory failure with hypoxia: Secondary | ICD-10-CM | POA: Diagnosis not present

## 2023-07-26 ENCOUNTER — Other Ambulatory Visit: Payer: Self-pay | Admitting: Family

## 2023-07-26 DIAGNOSIS — E785 Hyperlipidemia, unspecified: Secondary | ICD-10-CM

## 2023-07-27 ENCOUNTER — Other Ambulatory Visit: Payer: Self-pay

## 2023-07-27 DIAGNOSIS — C8311 Mantle cell lymphoma, lymph nodes of head, face, and neck: Secondary | ICD-10-CM

## 2023-07-28 ENCOUNTER — Inpatient Hospital Stay: Payer: 59 | Attending: Internal Medicine

## 2023-07-28 ENCOUNTER — Other Ambulatory Visit: Payer: Self-pay

## 2023-07-28 ENCOUNTER — Inpatient Hospital Stay (HOSPITAL_BASED_OUTPATIENT_CLINIC_OR_DEPARTMENT_OTHER): Payer: 59 | Admitting: Internal Medicine

## 2023-07-28 VITALS — BP 90/60 | HR 66 | Temp 97.6°F | Resp 18 | Wt 219.0 lb

## 2023-07-28 DIAGNOSIS — M069 Rheumatoid arthritis, unspecified: Secondary | ICD-10-CM | POA: Insufficient documentation

## 2023-07-28 DIAGNOSIS — C8311 Mantle cell lymphoma, lymph nodes of head, face, and neck: Secondary | ICD-10-CM | POA: Insufficient documentation

## 2023-07-28 DIAGNOSIS — Z86711 Personal history of pulmonary embolism: Secondary | ICD-10-CM | POA: Diagnosis not present

## 2023-07-28 DIAGNOSIS — Z79899 Other long term (current) drug therapy: Secondary | ICD-10-CM | POA: Insufficient documentation

## 2023-07-28 DIAGNOSIS — Z9221 Personal history of antineoplastic chemotherapy: Secondary | ICD-10-CM | POA: Diagnosis not present

## 2023-07-28 DIAGNOSIS — R0602 Shortness of breath: Secondary | ICD-10-CM | POA: Insufficient documentation

## 2023-07-28 DIAGNOSIS — I272 Pulmonary hypertension, unspecified: Secondary | ICD-10-CM | POA: Insufficient documentation

## 2023-07-28 LAB — CBC WITH DIFFERENTIAL (CANCER CENTER ONLY)
Abs Immature Granulocytes: 0.02 10*3/uL (ref 0.00–0.07)
Basophils Absolute: 0 10*3/uL (ref 0.0–0.1)
Basophils Relative: 0 %
Eosinophils Absolute: 0.1 10*3/uL (ref 0.0–0.5)
Eosinophils Relative: 2 %
HCT: 36.7 % (ref 36.0–46.0)
Hemoglobin: 11.6 g/dL — ABNORMAL LOW (ref 12.0–15.0)
Immature Granulocytes: 0 %
Lymphocytes Relative: 22 %
Lymphs Abs: 1.3 10*3/uL (ref 0.7–4.0)
MCH: 30.9 pg (ref 26.0–34.0)
MCHC: 31.6 g/dL (ref 30.0–36.0)
MCV: 97.6 fL (ref 80.0–100.0)
Monocytes Absolute: 0.4 10*3/uL (ref 0.1–1.0)
Monocytes Relative: 7 %
Neutro Abs: 4 10*3/uL (ref 1.7–7.7)
Neutrophils Relative %: 69 %
Platelet Count: 198 10*3/uL (ref 150–400)
RBC: 3.76 MIL/uL — ABNORMAL LOW (ref 3.87–5.11)
RDW: 13.6 % (ref 11.5–15.5)
WBC Count: 5.9 10*3/uL (ref 4.0–10.5)
nRBC: 0 % (ref 0.0–0.2)

## 2023-07-28 LAB — IRON AND TIBC
Iron: 59 ug/dL (ref 28–170)
Saturation Ratios: 23 % (ref 10.4–31.8)
TIBC: 262 ug/dL (ref 250–450)
UIBC: 203 ug/dL

## 2023-07-28 LAB — CMP (CANCER CENTER ONLY)
ALT: 13 U/L (ref 0–44)
AST: 23 U/L (ref 15–41)
Albumin: 3.5 g/dL (ref 3.5–5.0)
Alkaline Phosphatase: 57 U/L (ref 38–126)
Anion gap: 7 (ref 5–15)
BUN: 20 mg/dL (ref 8–23)
CO2: 27 mmol/L (ref 22–32)
Calcium: 8.9 mg/dL (ref 8.9–10.3)
Chloride: 104 mmol/L (ref 98–111)
Creatinine: 1 mg/dL (ref 0.44–1.00)
GFR, Estimated: 57 mL/min — ABNORMAL LOW (ref >60)
Glucose, Bld: 107 mg/dL — ABNORMAL HIGH (ref 70–99)
Potassium: 4.1 mmol/L (ref 3.5–5.1)
Sodium: 138 mmol/L (ref 135–145)
Total Bilirubin: 0.5 mg/dL (ref 0.0–1.2)
Total Protein: 6.2 g/dL — ABNORMAL LOW (ref 6.5–8.1)

## 2023-07-28 LAB — BRAIN NATRIURETIC PEPTIDE: B Natriuretic Peptide: 63.6 pg/mL (ref 0.0–100.0)

## 2023-07-28 LAB — VITAMIN D 25 HYDROXY (VIT D DEFICIENCY, FRACTURES): Vit D, 25-Hydroxy: 51.56 ng/mL (ref 30–100)

## 2023-07-28 LAB — FERRITIN: Ferritin: 140 ng/mL (ref 11–307)

## 2023-07-28 LAB — LACTATE DEHYDROGENASE: LDH: 135 U/L (ref 98–192)

## 2023-07-28 NOTE — Assessment & Plan Note (Addendum)
#  2016-2017- Mantle cell lymphoma recurrent biopsy-proven [July 2022]; Currently on Calquence [ DISCONTINUED  Rituximab-given the multifocal pneumonia]-  PET scan NOV 11th, 2024- Stable PET-CT. No findings for recurrent lymphoma.     # Currently on Calequence 100 mg twice a day [ AUG 1st, 2022].  Tolerating well.  Stable. Mild anemia- check iron studies/ferritin-Recommend gentle iron [iron biglycinate; 28 mg ] 1 pill a day.  This pill is unlikely to cause stomach upset or cause constipation.   # March 31st, 2023- right upper lobe pulmonary artery branches  consistent with pulmonary embolism- OFF eliquis [until dec 2024]-   stable.  # Immunocompromised state: Hx Multifocal pneumonia-[April 2023]-Given the multiple pneumonias/high risk of death-recommended continuation of further rituximab. MARCH 2024- IgG- 800.  Monitor for now. Stable.  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 Continue lasix 40 mg/day- as per Cards.   #Chronic respiratory failure -2 L home O2 -NICMP/pulmonary hypertension/chronic bilateral lung scarring-followed by pulmonary [Dr.Meier.[place]]; MAY 2024- HRCT-  Pulmonary parenchymal pattern of fibrosis, with interval resolution of previously seen superimposed ground-glass- consistent with UIP. S/p pulmonary - sep, 2024  Dr.Dgyali ; awaiting re-evaluation with pulmonary.   # Hx of Rheumatoid arthritis-given ILD-  s/p evaluation with  Rheumatology.   # Left shoulder pain/ Chronic arthritis-on meloxicam as needed- stable.  #Vaccinations : s/p flu shot- discussed r: COVID booster/RSV; ? Pneumonia vaccination [check with PCP]  # DISPOSITION:  # add iron studies; ferritin today # follow up-MD in 2 months -/friday  MD ;labs- cbc/cmp/ldh/BNP; quantitative immunoglobulin]-; iron studies; ferritinvit D 25-OH.Dr.B

## 2023-07-28 NOTE — Progress Notes (Signed)
 [facility] OFFICE PROGRESS NOTE  Patient Care Team: Allegra Grana, FNP as PCP - General (Family Medicine) End, Cristal Deer, MD as PCP - Cardiology (Cardiology) Duke Salvia, MD as PCP - Electrophysiology (Cardiology) Shelia Media, MD (Internal Medicine) Earna Coder, MD as Consulting Physician (Internal Medicine)   Cancer Staging  No matching staging information was found for the patient.    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 Feb 9th- START Benda-Ritux with neulasta; Prolonged neutropenia; DISCONT- Benda-Ritux;   # April 13 th 2017- START R-CHOP x1; severe/prolonged neutropenia; PET- CR; BMBx-Neg; Disc R-CHOP # June 2022- DIAGNOSIS: thickened cortex of 12 mm. An additional lymph node demonstrates a thickened cortex of 7 mm. A third lymph node demonstrates a mildly thickened cortex of 4 mm A. LYMPH NODE, LEFT AXILLA; ULTRASOUND-GUIDED BIOPSY:  - CD5+ MONOCLONAL B-CELL POPULATION; COMPATIBLE WITH INVOLVEMENT BY THE  PATIENT'S KNOWN MANTLE CELL LYMPHOMA.   #July 2022-second week-started ibrutinib 420 mg a dayx 1 week-stop because of severe rash.  Significant clinical response noted.  # # 26th MAY 2017- Start Rituxan q 62M Main OCT 12th 2017- PET NED.    # AUG 1st, 2022- start acalbrutinib. 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 Ambulating in a wheel chair.   Michelle [name] 83 y.o.  female pleasant patient multiple medical problems-ischemic cardiomyopathy; pulm hypertension; Hx of PE on eliquis and above history of recurrent mantle cell lymphoma on Calquence; here for follow-up.  No further hospitalizations.  Pt has a chronic cough that is lingering. Hearing is improving in right ear. Dyspnea with any exertion. No pain. Appetite is good. Eats 1 meal and a snack. Bowels are normal. Denies any fever or night sweats.   Patient admits compliance to Calquence.  Denies any headaches. Patient continues to be on 2 L of oxygen but as needed.    No nausea no vomiting.  No diarrhea.  No chest pain no new lumps or bumps.  Review of Systems  Constitutional:  Negative for chills, diaphoresis, fever, malaise/fatigue and weight loss.  HENT:  Negative for nosebleeds and sore throat.   Eyes:  Negative for double vision.  Respiratory:  Negative for hemoptysis, sputum production and wheezing.   Cardiovascular:  Negative for chest pain, palpitations, orthopnea and leg swelling.  Gastrointestinal:  Positive for constipation. Negative for abdominal pain, blood in stool, diarrhea, heartburn, melena, nausea and vomiting.  Genitourinary:  Negative for dysuria, frequency and urgency.  Musculoskeletal:  Positive for back pain and joint pain.  Skin: Negative.  Negative for itching and rash.  Neurological:  Negative for dizziness, tingling, focal weakness, weakness and headaches.  Endo/Heme/Allergies:  Does not bruise/bleed easily.  Psychiatric/Behavioral:  Negative for depression. The patient is not nervous/anxious and does not have  insomnia.     PAST MEDICAL HISTORY :  Past Medical History:  Diagnosis Date   Arthritis    Collagen vascular disease (HCC)    GERD (gastroesophageal reflux disease)    Headache(784.0)     HFrEF (heart failure with reduced ejection fraction) (HCC)    Nonischemic cardiomyopathy, LVEF as low as 30-35% in 08/2019   History of methotrexate therapy    Hyperlipidemia    hx   Lymphadenopathy of head and neck 01/2015   see on Thyroid ultrasound   Lymphoma, mantle cell (HCC) 06/01/2015   bx of lymph node in right breast/Stage IV Mantle Cell Lymphoma   Multifocal pneumonia 10/05/2021   Personal history of chemotherapy    Pulmonary embolism (HCC) 10/05/2021   acute   Respiratory failure (HCC) 10/05/2021   acute   Rheumatoid arthritis (HCC)     PAST SURGICAL HISTORY :   Past Surgical History:  Procedure Laterality Date   BREAST BIOPSY Left 11/07/2020   u/s bx-hydromark #3 "coil"-path pending   CARDIAC CATHETERIZATION  09/2004   [facility]; EF 60%   CARDIAC CATHETERIZATION  08/2004   [facility]   IR FLUORO GUIDED NEEDLE PLC ASPIRATION/INJECTION LOC  06/18/2018   PERIPHERAL VASCULAR CATHETERIZATION N/A 07/04/2015   Procedure: Shelda Pal Cath Insertion;  Surgeon: Annice Needy, MD;  Location: [facility] INVASIVE CV LAB;  Service: Cardiovascular;  Laterality: N/A;   PORTA CATH REMOVAL N/A 06/23/2018   Procedure: PORTA CATH REMOVAL;  Surgeon: Annice Needy, MD;  Location: [facility] INVASIVE CV LAB;  Service: Cardiovascular;  Laterality: N/A;   RIGHT/LEFT HEART CATH AND CORONARY ANGIOGRAPHY Bilateral 09/20/2019   Procedure: RIGHT/LEFT HEART CATH AND CORONARY ANGIOGRAPHY;  Surgeon: Yvonne Kendall, MD;  Location: [facility] INVASIVE CV LAB;  Service: Cardiovascular;  Laterality: Bilateral;    FAMILY HISTORY :   Family History  Problem Relation Age of Onset   Diabetes Mother    Cholelithiasis Mother    Hypertension Sister    Diabetes Sister    Heart murmur Sister    Arthritis Brother    Breast cancer Neg Hx     SOCIAL HISTORY:   Social History   Tobacco Use   Smoking status: Never   Smokeless tobacco: Never  Vaping Use   Vaping status: Never Used  Substance Use Topics   Alcohol use: No   Drug use: No     ALLERGIES:  has no known allergies.  MEDICATIONS:  Current Outpatient Medications  Medication Sig Dispense Refill   acalabrutinib maleate (CALQUENCE) 100 MG tablet Take 1 tablet (100 mg total) by mouth 2 (two) times daily. 60 tablet 2   acetaminophen (TYLENOL) 500 MG tablet Take 1,000 mg by mouth every 6 (six) hours as needed for mild pain.      albuterol (VENTOLIN HFA) 108 (90 Base) MCG/ACT inhaler 2 puffs inhaled orally every 4 to 6 hours as needed, not to exceed 12 puffs in 24 hours 18 each 1   apixaban (ELIQUIS) 5 MG TABS tablet TAKE 1 TABLET BY MOUTH TWICE A DAY 60 tablet 5   feeding supplement (ENSURE ENLIVE / ENSURE PLUS) LIQD Take 237 mLs by mouth 3 (three) times daily between meals. 237 mL 12   fluticasone (FLONASE) 50 MCG/ACT nasal spray PLACE 2 SPRAYS INTO BOTH NOSTRILS DAILY AS NEEDED FOR ALLERGIES. 48 mL 1   ipratropium-albuterol (DUONEB) 0.5-2.5 (3) MG/3ML SOLN Take 3 mLs by nebulization every 6 (six) hours as needed. 300 mL 2   KLOR-CON M10 10 MEQ tablet TAKE 1 TABLET  BY MOUTH EVERY DAY 90 tablet 1   losartan (COZAAR) 25 MG tablet TAKE 1/2 TABLET BY MOUTH EVERY DAY 45 tablet 0   Multiple Vitamins-Minerals (CENTRUM SILVER 50+WOMEN) TABS Take 1 tablet by mouth daily.     OXYGEN Inhale into the lungs. 2 Liters 24/7     rosuvastatin (CRESTOR) 5 MG tablet TAKE 1 TABLET (5 MG TOTAL) BY MOUTH DAILY. 90 tablet 3   carvedilol (COREG) 3.125 MG tablet TAKE 1 TABLET BY MOUTH TWICE A DAY 180 tablet 1   furosemide (LASIX) 40 MG tablet TAKE 1 TABLET BY MOUTH EVERY DAY 90 tablet 1   MEGARED OMEGA-3 KRILL OIL PO Take 1 tablet by mouth daily. (Patient not taking: Reported on 07/28/2023)     ondansetron (ZOFRAN) 8 MG tablet ONE PILL EVERY 8 HOURS AS NEEDED FOR NAUSEA/VOMITTING. (Patient not taking: Reported on 07/28/2023) 40 tablet 1   No current facility-administered medications for this visit.   Facility-Administered Medications Ordered in Other Visits  Medication Dose Route Frequency  Provider Last Rate Last Admin   heparin lock flush 100 unit/mL  500 Units Intravenous Once Louretta Shorten R, MD       sodium chloride flush (NS) 0.9 % injection 10 mL  10 mL Intravenous Once Louretta Shorten R, MD       sodium chloride flush (NS) 0.9 % injection 10 mL  10 mL Intravenous Once Earna Coder, MD        PHYSICAL EXAMINATION: ECOG PERFORMANCE STATUS: 0 - Asymptomatic  BP 90/60 (BP Location: Left Arm, Patient Position: Sitting)   Pulse 66   Temp 97.6 F (36.4 C)   Resp 18   Wt 219 lb (99.3 kg)   SpO2 99%   BMI 34.30 kg/m   Filed Weights   07/28/23 1326  Weight: 219 lb (99.3 kg)       Physical Exam HENT:     Head: Normocephalic and atraumatic.     Mouth/Throat:     Pharynx: No oropharyngeal exudate.  Eyes:     Pupils: Pupils are equal, round, and reactive to light.  Cardiovascular:     Rate and Rhythm: Normal rate and regular rhythm.  Pulmonary:     Effort: No respiratory distress.     Breath sounds: No wheezing.     Comments: Bilateral basilar crackles Abdominal:     General: Bowel sounds are normal. There is no distension.     Palpations: Abdomen is soft. There is no mass.     Tenderness: There is no abdominal tenderness. There is no guarding or rebound.  Musculoskeletal:        General: No tenderness. Normal range of motion.     Cervical back: Normal range of motion and neck supple.  Skin:    General: Skin is warm.  Neurological:     Mental Status: She is alert and oriented to person, place, and time.  Psychiatric:        Mood and Affect: Affect normal.    LABORATORY DATA:  I have reviewed the data as listed    Component Value Date/Time   NA 138 07/28/2023 1301   NA 145 (H) 09/08/2019 1359   NA 142 09/14/2013 1127   K 4.1 07/28/2023 1301   K 3.5 09/14/2013 1127   CL 104 07/28/2023 1301   CL 110 (H) 09/14/2013 1127   CO2 27 07/28/2023 1301   CO2 29 09/14/2013 1127   GLUCOSE 107 (H) 07/28/2023 1301   GLUCOSE 106 (H)  09/14/2013  1127   BUN 20 07/28/2023 1301   BUN 15 09/08/2019 1359   BUN 8 09/14/2013 1127   CREATININE 1.00 07/28/2023 1301   CREATININE 0.71 09/14/2013 1127   CREATININE 0.68 04/01/2013 1550   CALCIUM 8.9 07/28/2023 1301   CALCIUM 8.6 09/14/2013 1127   PROT 6.2 (L) 07/28/2023 1301   PROT 7.6 09/14/2013 1127   ALBUMIN 3.5 07/28/2023 1301   ALBUMIN 3.2 (L) 09/14/2013 1127   AST 23 07/28/2023 1301   ALT 13 07/28/2023 1301   ALT 19 09/14/2013 1127   ALKPHOS 57 07/28/2023 1301   ALKPHOS 76 09/14/2013 1127   BILITOT 0.5 07/28/2023 1301   GFRNONAA 57 (L) 07/28/2023 1301   GFRNONAA >60 09/14/2013 1127   GFRAA >60 01/20/2020 1303   GFRAA >60 09/14/2013 1127    No results found for: "SPEP", "UPEP"  Lab Results  Component Value Date   WBC 5.9 07/28/2023   NEUTROABS 4.0 07/28/2023   HGB 11.6 (L) 07/28/2023   HCT 36.7 07/28/2023   MCV 97.6 07/28/2023   PLT 198 07/28/2023      Chemistry      Component Value Date/Time   NA 138 07/28/2023 1301   NA 145 (H) 09/08/2019 1359   NA 142 09/14/2013 1127   K 4.1 07/28/2023 1301   K 3.5 09/14/2013 1127   CL 104 07/28/2023 1301   CL 110 (H) 09/14/2013 1127   CO2 27 07/28/2023 1301   CO2 29 09/14/2013 1127   BUN 20 07/28/2023 1301   BUN 15 09/08/2019 1359   BUN 8 09/14/2013 1127   CREATININE 1.00 07/28/2023 1301   CREATININE 0.71 09/14/2013 1127   CREATININE 0.68 04/01/2013 1550      Component Value Date/Time   CALCIUM 8.9 07/28/2023 1301   CALCIUM 8.6 09/14/2013 1127   ALKPHOS 57 07/28/2023 1301   ALKPHOS 76 09/14/2013 1127   AST 23 07/28/2023 1301   ALT 13 07/28/2023 1301   ALT 19 09/14/2013 1127   BILITOT 0.5 07/28/2023 1301       RADIOGRAPHIC STUDIES: I have personally reviewed the radiological images as listed and agreed with the findings in the report. No results found.   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 No findings for recurrent lymphoma.     # Currently on Calequence 100 mg twice a day [ AUG 1st, 2022].  Tolerating well.  Stable. Mild anemia- check iron studies/ferritin-Recommend gentle iron [iron biglycinate; 28 mg ] 1 pill a day.  This pill is unlikely to cause stomach upset or cause constipation.   # March 31st, 2023- right upper lobe pulmonary artery branches  consistent with pulmonary embolism- OFF eliquis [until dec 2024]-   stable.  # Immunocompromised state: Hx Multifocal pneumonia-[April 2023]-Given the multiple pneumonias/high risk of death-recommended continuation of further rituximab. MARCH 2024- IgG- 800.  Monitor for now. Stable.  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 Continue lasix 40 mg/day- as per Cards.   #Chronic respiratory failure -2 L home O2 -NICMP/pulmonary hypertension/chronic bilateral lung scarring-followed by pulmonary [Dr.Meier.[place]]; MAY 2024- HRCT-  Pulmonary parenchymal pattern of fibrosis, with interval resolution of previously seen superimposed  ground-glass- consistent with UIP. S/p pulmonary - sep, 2024  Dr.Dgyali ; awaiting re-evaluation with pulmonary.   # Hx of Rheumatoid arthritis-given ILD-  s/p evaluation with  Rheumatology.   # Left shoulder pain/ Chronic arthritis-on meloxicam as needed- stable.  #Vaccinations : s/p flu shot- discussed r: COVID booster/RSV; ? Pneumonia vaccination [check with PCP]  # DISPOSITION:  # add iron studies; ferritin today # follow up-MD in 2 months -/friday  MD ;labs- cbc/cmp/ldh/BNP; quantitative immunoglobulin]-; iron studies; ferritinvit D 25-OH.Dr.B   Orders Placed This Encounter  Procedures   Iron and TIBC    Standing  Status:   Future    Number of Occurrences:   1    Expected Date:   07/28/2023    Expiration Date:   07/27/2024   Ferritin    Standing Status:   Future    Number of Occurrences:   1    Expected Date:   07/28/2023    Expiration Date:   07/27/2024   CBC with Differential (Cancer Center Only)    Standing Status:   Future    Expected Date:   09/25/2023    Expiration Date:   07/27/2024   CMP (Cancer Center only)    Standing Status:   Future    Expected Date:   09/25/2023    Expiration Date:   07/27/2024   Lactate dehydrogenase    Standing Status:   Future    Expected Date:   09/25/2023    Expiration Date:   07/27/2024   Brain natriuretic peptide    Standing Status:   Future    Expected Date:   09/25/2023    Expiration Date:   07/27/2024   Iron and TIBC    Standing Status:   Future    Expected Date:   09/25/2023    Expiration Date:   07/27/2024   Ferritin    Standing Status:   Future    Expected Date:   09/25/2023    Expiration Date:   07/27/2024   Immunoglobulins, QN, A/E/G/M    Standing Status:   Future    Expected Date:   09/25/2023    Expiration Date:   07/27/2024   VITAMIN D 25 Hydroxy (Vit-D Deficiency, Fractures)    Standing Status:   Future    Expected Date:   09/25/2023    Expiration Date:   07/27/2024     All questions were answered. The patient knows to call the clinic with any problems, questions or concerns.      Earna Coder, MD 07/28/2023 2:19 PM

## 2023-07-28 NOTE — Progress Notes (Signed)
 Pt has a chronic cough that is lingering. Hearing is improving in right ear. Dyspnea with any exertion. No pain. Appetite is good. Eats 1 meal and a snack. Bowels are normal. Denies any fever or night sweats.

## 2023-07-28 NOTE — Patient Instructions (Signed)
#  Recommend gentle iron [iron biglycinate; 28 mg ] 1 pill a day.  This pill is unlikely to cause stomach upset or cause constipation.

## 2023-07-30 ENCOUNTER — Other Ambulatory Visit: Payer: Self-pay | Admitting: Internal Medicine

## 2023-07-30 LAB — CHROMOGRANIN A: Chromogranin A (ng/mL): 97.7 ng/mL (ref 0.0–101.8)

## 2023-07-31 LAB — IMMUNOGLOBULINS A/E/G/M, SERUM
IgA: 96 mg/dL (ref 64–422)
IgE (Immunoglobulin E), Serum: <2 [IU]/mL — ABNORMAL LOW (ref 6–495)
IgG (Immunoglobin G), Serum: 755 mg/dL (ref 586–1602)
IgM (Immunoglobulin M), Srm: 11 mg/dL — ABNORMAL LOW (ref 26–217)

## 2023-08-13 ENCOUNTER — Ambulatory Visit: Payer: 59 | Admitting: Student in an Organized Health Care Education/Training Program

## 2023-08-18 ENCOUNTER — Encounter: Payer: Self-pay | Admitting: Student in an Organized Health Care Education/Training Program

## 2023-08-18 ENCOUNTER — Ambulatory Visit: Admitting: Student in an Organized Health Care Education/Training Program

## 2023-08-18 VITALS — BP 102/72 | HR 58 | Temp 98.0°F | Ht 67.0 in | Wt 230.0 lb

## 2023-08-18 DIAGNOSIS — I8290 Acute embolism and thrombosis of unspecified vein: Secondary | ICD-10-CM | POA: Diagnosis not present

## 2023-08-18 DIAGNOSIS — M069 Rheumatoid arthritis, unspecified: Secondary | ICD-10-CM | POA: Diagnosis not present

## 2023-08-18 DIAGNOSIS — J849 Interstitial pulmonary disease, unspecified: Secondary | ICD-10-CM | POA: Diagnosis not present

## 2023-08-18 DIAGNOSIS — J9611 Chronic respiratory failure with hypoxia: Secondary | ICD-10-CM

## 2023-08-18 NOTE — Progress Notes (Signed)
 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 I have reviewed her previous imaging studies that showed underlying UIP pattern with subpleural reticulation and honeycombing. Physical exam is notable for rales over the bilateral bases.  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 CT imaging at that time was notable for the underlying UIP with additional ground glass opacities suggesting active inflammation. B-Cell depletion with anti-CD-20 monoclonal antibodies has been associated with persistent COVID infections with multiple underlying patterns of lung damage (including organizing pneumonia, diffuse alveolar damage, etc...). She did not have any tissue diagnosis and the underlying pattern is difficult to ascertain.   Her most recent HRCT was noted for a UIP pattern, with some progression in fibrosis compared to previous imaging dating back to 2018. The ground glass opacities are improved. She carries a history of rheumatoid arthritis. I had repeated her auto-immune workup during her prior visit which came back with a positive RF and anti-CCP. 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 She as unable to obtain her PFT's.  I will re-attempt to obtain PFT's to establish an FVC and TLC baseline, and repeat her high resolution chest CT in order to assess for any progression. While she awaits for a visit with rheumatology, I will initiate anti-fibrotic therapy with nintedanib. LFT's were recently checked and noted to be within normal.   -continue with O2 via nasal cannula with exertion -attempt repeat PFT's -repeat HRCT of  the chest -continue apixaban -will initiate nintedanib twice daily -pending evaluation with rheumatology   Return in about 3 months (around 11/18/2023).  I spent 30 minutes caring for this patient today, including preparing to see the patient, obtaining a medical history , reviewing a separately obtained history, performing a medically appropriate examination and/or evaluation, counseling and educating the patient/family/caregiver, ordering medications, tests, or procedures, documenting clinical information in the electronic health record, and independently interpreting results (not separately reported/billed) and communicating results to the patient/family/caregiver  Raechel Chute, MD [facility]  End of visit medications:  No orders of the defined types were placed in this encounter.    Current Outpatient Medications:    acalabrutinib maleate (CALQUENCE) 100 MG tablet, Take 1 tablet (100 mg total) by mouth 2 (two) times daily., Disp: 60 tablet, Rfl: 2   acetaminophen (TYLENOL) 500 MG tablet, Take 1,000 mg by mouth every 6 (six) hours as needed for mild pain. , Disp: , Rfl:    albuterol (VENTOLIN HFA) 108 (90 Base) MCG/ACT inhaler, 2 puffs inhaled orally every 4 to 6 hours as needed, not to exceed 12 puffs in 24 hours, Disp: 18 each, Rfl: 1   apixaban (ELIQUIS) 5 MG TABS tablet, TAKE 1 TABLET BY MOUTH TWICE A DAY, Disp: 60 tablet, Rfl: 5   carvedilol (COREG) 3.125 MG tablet, TAKE 1 TABLET BY MOUTH TWICE A DAY, Disp: 180 tablet, Rfl: 1   feeding supplement (ENSURE ENLIVE / ENSURE PLUS) LIQD, Take 237 mLs by mouth 3 (three) times daily between meals., Disp: 237 mL, Rfl: 12   fluticasone (FLONASE) 50 MCG/ACT nasal spray, PLACE 2 SPRAYS INTO BOTH NOSTRILS DAILY AS NEEDED FOR ALLERGIES., Disp: 48 mL, Rfl: 1   furosemide (LASIX)  40 MG tablet, TAKE 1 TABLET BY MOUTH EVERY DAY, Disp: 90 tablet, Rfl: 2   ipratropium-albuterol (DUONEB) 0.5-2.5 (3) MG/3ML SOLN, Take 3 mLs by  nebulization every 6 (six) hours as needed., Disp: 300 mL, Rfl: 2   KLOR-CON M10 10 MEQ tablet, TAKE 1 TABLET BY MOUTH EVERY DAY, Disp: 90 tablet, Rfl: 1   losartan (COZAAR) 25 MG tablet, TAKE 1/2 TABLET BY MOUTH EVERY DAY, Disp: 45 tablet, Rfl: 0   MEGARED OMEGA-3 KRILL OIL PO, Take 1 tablet by mouth daily., Disp: , Rfl:    Multiple Vitamins-Minerals (CENTRUM SILVER 50+WOMEN) TABS, Take 1 tablet by mouth daily., Disp: , Rfl:    ondansetron (ZOFRAN) 8 MG tablet, ONE PILL EVERY 8 HOURS AS NEEDED FOR NAUSEA/VOMITTING., Disp: 40 tablet, Rfl: 1   OXYGEN, Inhale into the lungs. 2 Liters 24/7, Disp: , Rfl:    rosuvastatin (CRESTOR) 5 MG tablet, TAKE 1 TABLET (5 MG TOTAL) BY MOUTH DAILY., Disp: 90 tablet, Rfl: 3 No current facility-administered medications for this visit.  Facility-Administered Medications Ordered in Other Visits:    heparin lock flush 100 unit/mL, 500 Units, Intravenous, Once, Brahmanday, Govinda R, MD   sodium chloride flush (NS) 0.9 % injection 10 mL, 10 mL, Intravenous, Once, Brahmanday, Govinda R, MD   sodium chloride flush (NS) 0.9 % injection 10 mL, 10 mL, Intravenous, Once, Earna Coder, MD   Subjective:   PATIENT ID: Michelle [name] GENDER: female DOB: [birth year], MRN: [id number]  Chief Complaint  Patient presents with   Follow-up    Cough. Wearing oxygen all night and as needed throughout the day.     HPI  Patient is a pleasant 83 year old female with a history of multiple medical problems presenting to clinic today for follow up. She was previously followed by Dr. Thora Lance and has since transitioned her care over to our [facility] location.  Interval history has been unremarkable. She continues to be active in church and signs in choir. She has no worsening of her shortness of breath or any increase in respiratory symptoms. Cough is at baseline. She was unable to see rheumatology and appears to be schedule to see Dr. Allena Katz in April.  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 She was admitted with acute hypoxic respiratory failure to [facility] in March of 2023. She was found to have COVID with persistence of viral detection. She was treated with steroids, antibiotics and molnupravir. At that point, she was treated with Rituximab. She was also found to have an acute PE treated with Apixaban. She was discharged home on oxygen therapy with a prednisone taper and diuretics. She was re-admitted in May of 2023 again with worsening respiratory failure and diagnosed with multi-focal pneumonia. Patient again treated with antibiotics and steroids. COVID was still positive during said admission in May of 2023.   Imaging during said admission noted underlying fibrotic lung disease (UIP pattern) with super imposed ground glass opacities. She was then seen by Dr. Lilian Kapur in clinic and prescribed a prolonged prednisone taper and repeat HRCT. Patient was last seen by me in September of 2024. She was seen for an acute visit on 05/12/2023 by Dr. Belia Heman in our clinic due to bronchitis. She was prescribed prednisone by her PCP which she felt helped.  She was unable to perform her PFT's, felt weak and was unable to cooperate with the process.   Patient  is followed closely by oncology for Mantle cell lymphoma. She follows with Dr. Donneta Romberg. Patient is maintained on Acalabrutinib. Rituximab had been discontinued for over a year now. She also follows with Dr. Okey Dupre from cardiology regarding NICM (EF as low as 30% in 2021, recovered to 60% in 2023) and is maintained on goal directed medical therapy. 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   Past medical history notable for NICM, HFpEF, OSA on CPAP, RA, GERD, and Stage IV mantle cell lymphoma. She reports being diagnosed with RA by Dr. Gavin Potters many years ago. She has not been seen by rheumatology for  over 5 years.  Ancillary information including prior medications, full medical/surgical/family/social histories, and PFTs (when available) are listed below and have been reviewed.   Review of Systems  Constitutional:  Negative for chills, fever, malaise/fatigue and weight loss.  Respiratory:  Positive for shortness of breath. Negative for cough, hemoptysis, sputum production and wheezing.   Cardiovascular:  Negative for chest pain and palpitations.     Objective:   Vitals:   08/18/23 1023  BP: 102/72  Pulse: (!) 58  Temp: 98 F (36.7 C)  TempSrc: Temporal  SpO2: 98%  Weight: 230 lb (104.3 kg)  Height: 5\' 7"  (1.702 m)   98% on RA BMI Readings from Last 3 Encounters:  08/18/23 36.02 kg/m  07/28/23 34.30 kg/m  05/22/23 35.55 kg/m   Wt Readings from Last 3 Encounters:  08/18/23 230 lb (104.3 kg)  07/28/23 219 lb (99.3 kg)  05/22/23 227 lb (103 kg)    Physical Exam Constitutional:      Appearance: Normal appearance. She is obese.  Cardiovascular:     Rate and Rhythm: Normal rate and regular rhythm.     Pulses: Normal pulses.     Heart sounds: Normal heart sounds.  Pulmonary:     Effort: Pulmonary effort is normal.     Breath sounds: Rales present. No wheezing.  Abdominal:     General: There is distension.     Palpations: Abdomen is soft.  Neurological:     General: No focal deficit present.     Mental Status: She is alert and oriented to person, place, and time. Mental status is at baseline.       Ancillary Information    Past Medical History:  Diagnosis Date   Arthritis    Collagen vascular disease (HCC)    GERD (gastroesophageal reflux disease)    Headache(784.0)    HFrEF (heart failure with reduced ejection fraction) (HCC)    Nonischemic cardiomyopathy, LVEF as low as 30-35% in 08/2019   History of methotrexate therapy    Hyperlipidemia    hx   Lymphadenopathy of head and neck 01/2015   see on Thyroid ultrasound   Lymphoma, mantle cell (HCC)  06/01/2015   bx of lymph node in right breast/Stage IV Mantle Cell Lymphoma   Multifocal pneumonia 10/05/2021   Personal history of chemotherapy    Pulmonary embolism (HCC) 10/05/2021   acute   Respiratory failure (HCC) 10/05/2021   acute   Rheumatoid arthritis (HCC)      Family History  Problem Relation Age of Onset   Diabetes Mother    Cholelithiasis Mother    Hypertension Sister    Diabetes Sister    Heart murmur Sister    Arthritis Brother    Breast cancer Neg Hx      Past Surgical History:  Procedure Laterality Date   BREAST BIOPSY Left 11/07/2020   u/s  bx-hydromark #3 "coil"-path pending   CARDIAC CATHETERIZATION  09/2004   [facility]; EF 60%   CARDIAC CATHETERIZATION  08/2004   [facility]   IR FLUORO GUIDED NEEDLE PLC ASPIRATION/INJECTION LOC  06/18/2018   PERIPHERAL VASCULAR CATHETERIZATION N/A 07/04/2015   Procedure: Shelda Pal Cath Insertion;  Surgeon: Annice Needy, MD;  Location: [facility] INVASIVE CV LAB;  Service: Cardiovascular;  Laterality: N/A;   PORTA CATH REMOVAL N/A 06/23/2018   Procedure: PORTA CATH REMOVAL;  Surgeon: Annice Needy, MD;  Location: [facility] INVASIVE CV LAB;  Service: Cardiovascular;  Laterality: N/A;   RIGHT/LEFT HEART CATH AND CORONARY ANGIOGRAPHY Bilateral 09/20/2019   Procedure: RIGHT/LEFT HEART CATH AND CORONARY ANGIOGRAPHY;  Surgeon: Yvonne Kendall, MD;  Location: [facility] INVASIVE CV LAB;  Service: Cardiovascular;  Laterality: Bilateral;    Social History   Socioeconomic History   Marital status: Married    Spouse name: Not on file   Number of children: Not on file   Years of education: Not on file   Highest education level: Not on file  Occupational History   Not on file  Tobacco Use   Smoking status: Never   Smokeless tobacco: Never  Vaping Use   Vaping status: Never Used  Substance and Sexual Activity   Alcohol use: No   Drug use: No   Sexual activity: Never  Other Topics Concern   Not on file  Social History Narrative   ** Merged History Encounter  **       Lives in [place]. Works as Clinical biochemist.   Social Drivers of Corporate investment banker Strain: Low Risk  (12/03/2022)   Overall Financial Resource Strain (CARDIA)    Difficulty of Paying Living Expenses: Not hard at all  Food Insecurity: No Food Insecurity (12/03/2022)   Hunger Vital Sign    Worried About Running Out of Food in the Last Year: Never true    Ran Out of Food in the Last Year: Never true  Transportation Needs: No Transportation Needs (12/03/2022)   PRAPARE - Administrator, Civil Service (Medical): No    Lack of Transportation (Non-Medical): No  Physical Activity: Inactive (12/03/2022)   Exercise Vital Sign    Days of Exercise per Week: 0 days    Minutes of Exercise per Session: 0 min  Stress: No Stress Concern Present (12/03/2022)   [employer] of Occupational Health - Occupational Stress Questionnaire    Feeling of Stress : Not at all  Social Connections: Moderately Integrated (12/03/2022)   Social Connection and Isolation Panel [NHANES]    Frequency of Communication with Friends and Family: More than three times a week    Frequency of Social Gatherings with Friends and Family: More than three times a week    Attends Religious Services: More than 4 times per year    Active Member of [employer] or Organizations: Yes    Attends Banker Meetings: More than 4 times per year    Marital Status: Widowed  Intimate Partner Violence: Not At Risk (12/03/2022)   Humiliation, Afraid, Rape, and Kick questionnaire    Fear of Current or Ex-Partner: No    Emotionally Abused: No    Physically Abused: No    Sexually Abused: No     No Known Allergies   CBC    Component Value Date/Time   WBC 5.9 07/28/2023 1302   WBC 10.5 05/22/2023 0932   RBC 3.76 (L) 07/28/2023 1302   HGB 11.6 (L) 07/28/2023 1302   HGB  13.4 09/08/2019 1359   HCT 36.7 07/28/2023 1302   HCT 40.9 09/08/2019 1359   PLT 198 07/28/2023 1302   PLT 194 09/08/2019 1359   MCV 97.6  07/28/2023 1302   MCV 90 09/08/2019 1359   MCV 89 09/14/2013 1127   MCH 30.9 07/28/2023 1302   MCHC 31.6 07/28/2023 1302   RDW 13.6 07/28/2023 1302   RDW 13.9 09/08/2019 1359   RDW 15.1 (H) 09/14/2013 1127   LYMPHSABS 1.3 07/28/2023 1302   LYMPHSABS 1.3 09/08/2019 1359   MONOABS 0.4 07/28/2023 1302   EOSABS 0.1 07/28/2023 1302   EOSABS 0.1 09/08/2019 1359   BASOSABS 0.0 07/28/2023 1302   BASOSABS 0.0 09/08/2019 1359    Pulmonary Functions Testing Results:    Latest Ref Rng & Units 05/20/2023    9:38 AM 11/19/2021    1:33 PM  PFT Results  FVC-Pre L 0.89  1.24   FVC-Predicted Pre % 29  50   FVC-Post L  1.27   FVC-Predicted Post %  52   Pre FEV1/FVC % % 100  82   Post FEV1/FCV % %  91   FEV1-Pre L 0.89  1.02   FEV1-Predicted Pre % 39  53   FEV1-Post L  1.15   TLC L  3.08   TLC % Predicted %  55   RV % Predicted %  61     Outpatient Medications Prior to Visit  Medication Sig Dispense Refill   acalabrutinib maleate (CALQUENCE) 100 MG tablet Take 1 tablet (100 mg total) by mouth 2 (two) times daily. 60 tablet 2   acetaminophen (TYLENOL) 500 MG tablet Take 1,000 mg by mouth every 6 (six) hours as needed for mild pain.      albuterol (VENTOLIN HFA) 108 (90 Base) MCG/ACT inhaler 2 puffs inhaled orally every 4 to 6 hours as needed, not to exceed 12 puffs in 24 hours 18 each 1   apixaban (ELIQUIS) 5 MG TABS tablet TAKE 1 TABLET BY MOUTH TWICE A DAY 60 tablet 5   carvedilol (COREG) 3.125 MG tablet TAKE 1 TABLET BY MOUTH TWICE A DAY 180 tablet 1   feeding supplement (ENSURE ENLIVE / ENSURE PLUS) LIQD Take 237 mLs by mouth 3 (three) times daily between meals. 237 mL 12   fluticasone (FLONASE) 50 MCG/ACT nasal spray PLACE 2 SPRAYS INTO BOTH NOSTRILS DAILY AS NEEDED FOR ALLERGIES. 48 mL 1   furosemide (LASIX) 40 MG tablet TAKE 1 TABLET BY MOUTH EVERY DAY 90 tablet 2   ipratropium-albuterol (DUONEB) 0.5-2.5 (3) MG/3ML SOLN Take 3 mLs by nebulization every 6 (six) hours as needed. 300 mL  2   KLOR-CON M10 10 MEQ tablet TAKE 1 TABLET BY MOUTH EVERY DAY 90 tablet 1   losartan (COZAAR) 25 MG tablet TAKE 1/2 TABLET BY MOUTH EVERY DAY 45 tablet 0   MEGARED OMEGA-3 KRILL OIL PO Take 1 tablet by mouth daily.     Multiple Vitamins-Minerals (CENTRUM SILVER 50+WOMEN) TABS Take 1 tablet by mouth daily.     ondansetron (ZOFRAN) 8 MG tablet ONE PILL EVERY 8 HOURS AS NEEDED FOR NAUSEA/VOMITTING. 40 tablet 1   OXYGEN Inhale into the lungs. 2 Liters 24/7     rosuvastatin (CRESTOR) 5 MG tablet TAKE 1 TABLET (5 MG TOTAL) BY MOUTH DAILY. 90 tablet 3   Facility-Administered Medications Prior to Visit  Medication Dose Route Frequency Provider Last Rate Last Admin   heparin lock flush 100 unit/mL  500 Units Intravenous Once Earna Coder, MD  sodium chloride flush (NS) 0.9 % injection 10 mL  10 mL Intravenous Once Louretta Shorten R, MD       sodium chloride flush (NS) 0.9 % injection 10 mL  10 mL Intravenous Once Earna Coder, MD

## 2023-08-20 NOTE — Telephone Encounter (Signed)
 I spoke with Michelle [name] with [facility] Rheumatology and Michelle [name] has an appt on 09/08/23  @11 :00am

## 2023-08-21 ENCOUNTER — Other Ambulatory Visit: Payer: Self-pay

## 2023-08-21 ENCOUNTER — Other Ambulatory Visit (HOSPITAL_COMMUNITY): Payer: Self-pay

## 2023-08-21 NOTE — Progress Notes (Signed)
 Specialty Pharmacy Refill Coordination Note  Michelle [name] is a 83 y.o. female contacted today regarding refills of specialty medication(s) Acalabrutinib Maleate (Calquence)   Patient requested Delivery   Delivery date: 08/25/23   Verified address: [address]   [place] [address]   Medication will be filled on 08/24/23.

## 2023-08-21 NOTE — Progress Notes (Signed)
 Specialty Pharmacy Ongoing Clinical Assessment Note  Michelle [name] is a 83 y.o. female who is being followed by the specialty pharmacy service for RxSp Oncology   Patient's specialty medication(s) reviewed today: Acalabrutinib Maleate (Calquence)   Missed doses in the last 4 weeks: 0   Patient/Caregiver did not have any additional questions or concerns.   Therapeutic benefit summary: Patient is achieving benefit   Adverse events/side effects summary: No adverse events/side effects   Patient's therapy is appropriate to: Continue    Goals Addressed             This Visit's Progress    Slow Disease Progression   On track    Patient is on track. Patient will maintain adherence         Follow up:  6 months  Otto Herb Specialty Pharmacist

## 2023-08-22 DIAGNOSIS — I2699 Other pulmonary embolism without acute cor pulmonale: Secondary | ICD-10-CM | POA: Diagnosis not present

## 2023-08-22 DIAGNOSIS — J9601 Acute respiratory failure with hypoxia: Secondary | ICD-10-CM | POA: Diagnosis not present

## 2023-08-22 DIAGNOSIS — G4733 Obstructive sleep apnea (adult) (pediatric): Secondary | ICD-10-CM | POA: Diagnosis not present

## 2023-08-24 ENCOUNTER — Other Ambulatory Visit (HOSPITAL_COMMUNITY): Payer: Self-pay

## 2023-08-26 ENCOUNTER — Telehealth: Payer: Self-pay | Admitting: Pharmacist

## 2023-08-26 DIAGNOSIS — J849 Interstitial pulmonary disease, unspecified: Secondary | ICD-10-CM

## 2023-08-26 NOTE — Telephone Encounter (Signed)
 Received Ofev new start paperwork via Onbase  Submitted a Prior Authorization request to [facility] for OFEV via CoverMyMeds. Will update once we receive a response.  Key: [id number]

## 2023-08-28 ENCOUNTER — Ambulatory Visit
Admission: RE | Admit: 2023-08-28 | Discharge: 2023-08-28 | Disposition: A | Discharge status: Home / Self Care | Source: Ambulatory Visit | Attending: Family | Admitting: Family

## 2023-08-28 DIAGNOSIS — J849 Interstitial pulmonary disease, unspecified: Secondary | ICD-10-CM | POA: Insufficient documentation

## 2023-08-28 DIAGNOSIS — I700 Atherosclerosis of aorta: Secondary | ICD-10-CM | POA: Diagnosis not present

## 2023-08-28 DIAGNOSIS — J8410 Pulmonary fibrosis, unspecified: Secondary | ICD-10-CM | POA: Diagnosis not present

## 2023-08-28 DIAGNOSIS — R0602 Shortness of breath: Secondary | ICD-10-CM | POA: Diagnosis not present

## 2023-08-28 DIAGNOSIS — R918 Other nonspecific abnormal finding of lung field: Secondary | ICD-10-CM | POA: Diagnosis not present

## 2023-08-31 ENCOUNTER — Other Ambulatory Visit: Payer: Self-pay

## 2023-08-31 ENCOUNTER — Other Ambulatory Visit (HOSPITAL_COMMUNITY): Payer: Self-pay

## 2023-08-31 MED ORDER — OFEV 150 MG PO CAPS: 150.0000 mg | ORAL_CAPSULE | Freq: Two times a day (BID) | ORAL | 1 refills | Status: DC

## 2023-08-31 NOTE — Telephone Encounter (Signed)
 Received notification from [facility] regarding a prior authorization for OFEV. Authorization has been APPROVED from 08/26/2023 to 06/01/2024. Approval letter sent to scan center.  Per test claim, copay for 30 days supply is $0  Patient can fill through [facility]: [phone]   Authorization # [id number]  There appears to be a drug interaction with Calquence however Ofev is not immunosuppressive and will not affect efficacy of Calquence. Calquence is not a moderate to strong inducer of CYP3A4 and P-gp   Chesley Mires, PharmD, MPH, BCPS, CPP Clinical Pharmacist (Rheumatology and Pulmonology)

## 2023-09-07 NOTE — Telephone Encounter (Addendum)
 Called patient regarding Ofev. Patient states she started the medication on Friday. Tolerated well. Denies any diarrhea episodes. She states that yesterday morning she did experience some upset stomach but she drank an Ensure and this helped settle symptoms down. She has been advised to ensure she is taking  medication approximately 12 hours apart as well with food.  Reviewed most common side effect of diarrhea. Advised her to take OTC loperamide prn for loose stools but she should notify clinic if recurrent and/or severe diarrhea.  Chesley Mires, PharmD, MPH, BCPS, CPP Clinical Pharmacist (Rheumatology and Pulmonology)

## 2023-09-14 ENCOUNTER — Ambulatory Visit: Payer: Self-pay

## 2023-09-14 NOTE — Telephone Encounter (Signed)
 Continue taking once a day Ofev for at least a few more weeks and then reintroduce the second dose to see if it is better tolerated.  Make sure she is also taking it with food.

## 2023-09-14 NOTE — Telephone Encounter (Signed)
 Copied from CRM [id number]. Topic: Clinical - Prescription Issue >> Sep 14, 2023  9:50 AM Alverda Joe S wrote: Reason for CRM: patient is calling because her meds Nintedanib (OFEV) 150 MG CAPS is making her feel really sick, bad stomach aches, restless, patient says meds slow her down.  E2C2 Pulmonary Triage - Initial Assessment Questions "Chief Complaint (e.g., cough, sob, wheezing, fever, chills, sweat or additional symptoms) *Go to specific symptom protocol after initial questions.  Medication Question- Side Effects of Nintedanib (OFEV)  Symptoms: Nausea, Trouble Sleeping, Diarrhea (1), Vomiting (1)  "How long have symptoms been present?" 08-31-2023  Have you tested for COVID or Flu? Note: If not, ask patient if a home test can be taken. If so, instruct patient to call back for positive results.  No  MEDICINES:   "Have you used any OTC meds to help with symptoms?" No If yes, ask "What medications?"  None   Reason for Disposition  Caller wants to use a complementary or alternative medicine  Answer Assessment - Initial Assessment Questions 1. NAME of MEDICINE: "What medicine(s) are you calling about?"     OFEV  2. QUESTION: "What is your question?" (e.g., double dose of medicine, side effect)      Side Effects   3. PRESCRIBER: "Who prescribed the medicine?" Reason: if prescribed by specialist, call should be referred to that group.     Dr.Dgayli  4. SYMPTOMS: "Do you have any symptoms?" If Yes, ask: "What symptoms are you having?"  "How bad are the symptoms (e.g., mild, moderate, severe)     Nausea, Trouble Sleeping, Vomiting, Diarrhea  5. PREGNANCY:  "Is there any chance that you are pregnant?" "When was your last menstrual period?"     No and No  Protocols used: Medication Question Call-A-AH

## 2023-09-14 NOTE — Telephone Encounter (Signed)
 Started taking Ofev a little over a week ago. She was taking it twice a day.Symptoms started last week. GI symptoms and fatigue. She reduced the Ofev to once a day and her symptoms have resolved. Is it ok to keep taking the Ofev once a day?  Dr. Darnelle Elders is out of the office. Dr. Viva Grise please advise.

## 2023-09-14 NOTE — Telephone Encounter (Signed)
 I have notified the patient. Nothing further needed.

## 2023-09-17 ENCOUNTER — Other Ambulatory Visit: Payer: Self-pay | Admitting: Internal Medicine

## 2023-09-21 ENCOUNTER — Other Ambulatory Visit: Payer: Self-pay

## 2023-09-22 DIAGNOSIS — I2699 Other pulmonary embolism without acute cor pulmonale: Secondary | ICD-10-CM | POA: Diagnosis not present

## 2023-09-22 DIAGNOSIS — J9601 Acute respiratory failure with hypoxia: Secondary | ICD-10-CM | POA: Diagnosis not present

## 2023-09-22 DIAGNOSIS — G4733 Obstructive sleep apnea (adult) (pediatric): Secondary | ICD-10-CM | POA: Diagnosis not present

## 2023-09-25 ENCOUNTER — Inpatient Hospital Stay: Payer: 59 | Admitting: Internal Medicine

## 2023-09-25 ENCOUNTER — Telehealth: Payer: Self-pay

## 2023-09-25 ENCOUNTER — Inpatient Hospital Stay: Payer: 59 | Attending: Internal Medicine

## 2023-09-25 ENCOUNTER — Encounter: Payer: Self-pay | Admitting: Internal Medicine

## 2023-09-25 VITALS — BP 109/70 | HR 67 | Temp 97.5°F | Resp 16 | Wt 225.0 lb

## 2023-09-25 DIAGNOSIS — C8311 Mantle cell lymphoma, lymph nodes of head, face, and neck: Secondary | ICD-10-CM | POA: Diagnosis not present

## 2023-09-25 DIAGNOSIS — D649 Anemia, unspecified: Secondary | ICD-10-CM | POA: Insufficient documentation

## 2023-09-25 DIAGNOSIS — Z9221 Personal history of antineoplastic chemotherapy: Secondary | ICD-10-CM | POA: Diagnosis not present

## 2023-09-25 DIAGNOSIS — M069 Rheumatoid arthritis, unspecified: Secondary | ICD-10-CM | POA: Diagnosis not present

## 2023-09-25 DIAGNOSIS — Z79899 Other long term (current) drug therapy: Secondary | ICD-10-CM | POA: Insufficient documentation

## 2023-09-25 DIAGNOSIS — Z86711 Personal history of pulmonary embolism: Secondary | ICD-10-CM | POA: Diagnosis not present

## 2023-09-25 DIAGNOSIS — J9610 Chronic respiratory failure, unspecified whether with hypoxia or hypercapnia: Secondary | ICD-10-CM | POA: Diagnosis not present

## 2023-09-25 DIAGNOSIS — Z9981 Dependence on supplemental oxygen: Secondary | ICD-10-CM | POA: Insufficient documentation

## 2023-09-25 LAB — CBC WITH DIFFERENTIAL (CANCER CENTER ONLY)
Abs Immature Granulocytes: 0.05 10*3/uL (ref 0.00–0.07)
Basophils Absolute: 0 10*3/uL (ref 0.0–0.1)
Basophils Relative: 0 %
Eosinophils Absolute: 0.1 10*3/uL (ref 0.0–0.5)
Eosinophils Relative: 2 %
HCT: 40.6 % (ref 36.0–46.0)
Hemoglobin: 12.9 g/dL (ref 12.0–15.0)
Immature Granulocytes: 1 %
Lymphocytes Relative: 21 %
Lymphs Abs: 1.7 10*3/uL (ref 0.7–4.0)
MCH: 30.6 pg (ref 26.0–34.0)
MCHC: 31.8 g/dL (ref 30.0–36.0)
MCV: 96.4 fL (ref 80.0–100.0)
Monocytes Absolute: 0.4 10*3/uL (ref 0.1–1.0)
Monocytes Relative: 5 %
Neutro Abs: 5.9 10*3/uL (ref 1.7–7.7)
Neutrophils Relative %: 71 %
Platelet Count: 210 10*3/uL (ref 150–400)
RBC: 4.21 MIL/uL (ref 3.87–5.11)
RDW: 14.1 % (ref 11.5–15.5)
WBC Count: 8.2 10*3/uL (ref 4.0–10.5)
nRBC: 0 % (ref 0.0–0.2)

## 2023-09-25 LAB — BRAIN NATRIURETIC PEPTIDE: B Natriuretic Peptide: 64 pg/mL (ref 0.0–100.0)

## 2023-09-25 LAB — CMP (CANCER CENTER ONLY)
ALT: 13 U/L (ref 0–44)
AST: 21 U/L (ref 15–41)
Albumin: 3.8 g/dL (ref 3.5–5.0)
Alkaline Phosphatase: 70 U/L (ref 38–126)
Anion gap: 9 (ref 5–15)
BUN: 16 mg/dL (ref 8–23)
CO2: 25 mmol/L (ref 22–32)
Calcium: 9 mg/dL (ref 8.9–10.3)
Chloride: 102 mmol/L (ref 98–111)
Creatinine: 0.7 mg/dL (ref 0.44–1.00)
GFR, Estimated: >60 mL/min (ref >60)
Glucose, Bld: 95 mg/dL (ref 70–99)
Potassium: 3.7 mmol/L (ref 3.5–5.1)
Sodium: 136 mmol/L (ref 135–145)
Total Bilirubin: 0.5 mg/dL (ref 0.0–1.2)
Total Protein: 6.9 g/dL (ref 6.5–8.1)

## 2023-09-25 LAB — IRON AND TIBC
Iron: 70 ug/dL (ref 28–170)
Saturation Ratios: 23 % (ref 10.4–31.8)
TIBC: 304 ug/dL (ref 250–450)
UIBC: 234 ug/dL

## 2023-09-25 LAB — FERRITIN: Ferritin: 90 ng/mL (ref 11–307)

## 2023-09-25 LAB — LACTATE DEHYDROGENASE: LDH: 150 U/L (ref 98–192)

## 2023-09-25 LAB — VITAMIN D 25 HYDROXY (VIT D DEFICIENCY, FRACTURES): Vit D, 25-Hydroxy: 49 ng/mL (ref 30–100)

## 2023-09-25 NOTE — Progress Notes (Signed)
 [facility] OFFICE PROGRESS NOTE  Patient Care Team: Calista Catching, FNP as PCP - General (Family Medicine) End, Veryl Gottron, MD as PCP - Cardiology (Cardiology) Verona Goodwill, MD as PCP - Electrophysiology (Cardiology) Thomes Flicker, MD (Internal Medicine) Gwyn Leos, MD as Consulting Physician (Internal Medicine)   Cancer Staging  No matching staging information was found for the patient.    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 Feb 9th- START Benda-Ritux with neulasta ; Prolonged neutropenia; DISCONT- Benda-Ritux;   # April 13 th 2017- START R-CHOP x1; severe/prolonged neutropenia; PET- CR; BMBx-Neg; Disc R-CHOP # June 2022- DIAGNOSIS: thickened cortex of 12 mm. An additional lymph node demonstrates a thickened cortex of 7 mm. A third lymph node demonstrates a mildly thickened cortex of 4 mm A. LYMPH NODE, LEFT AXILLA; ULTRASOUND-GUIDED BIOPSY:  - CD5+ MONOCLONAL B-CELL POPULATION; COMPATIBLE WITH INVOLVEMENT BY THE  PATIENT'S KNOWN MANTLE CELL LYMPHOMA.   #July 2022-second week-started ibrutinib  420 mg a dayx 1 week-stop because of severe rash.  Significant clinical response noted.  # # 26th MAY 2017- Start Rituxan  q 70M Main OCT 12th 2017- PET NED.    # AUG 1st, 2022- start acalbrutinib. 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 Ambulating in a wheel chair.   Michelle [name] 83 y.o.  female pleasant patient multiple medical problems-ischemic cardiomyopathy; pulm hypertension; Hx of PE on eliquis  and above history of recurrent mantle cell lymphoma on Calquence ; here for follow-up.  Patient here for follow up. Patient has some concerns with Nintedanib medication. Patient states she is unable to take this medication. Patient complains makes it dizzy, nausea, vomiting, diarrhea and generalized weakness. Patient stopped meds x 2 week. She feels improved already.   No further hospitalizations. Pt has a chronic cough that is lingering.  No pain. Appetite is good. Eats 1 meal and a snack. Bowels are normal. Denies any fever or night sweats. Patient admits compliance to Calquence .    No chest pain no new lumps or bumps.  Review of Systems  Constitutional:  Negative for chills, diaphoresis, fever, malaise/fatigue and weight loss.  HENT:  Negative for nosebleeds and sore throat.   Eyes:  Negative for double vision.  Respiratory:  Negative for hemoptysis, sputum production and wheezing.   Cardiovascular:  Negative for chest pain, palpitations, orthopnea and leg swelling.  Gastrointestinal:  Positive for constipation. Negative for abdominal pain, blood in stool, diarrhea, heartburn, melena, nausea and vomiting.  Genitourinary:  Negative for dysuria, frequency and urgency.  Musculoskeletal:  Positive for back pain and joint pain.  Skin: Negative.  Negative for itching and rash.  Neurological:  Negative for dizziness, tingling, focal weakness, weakness and headaches.  Endo/Heme/Allergies:  Does not bruise/bleed easily.  Psychiatric/Behavioral:  Negative for depression. The  patient is not nervous/anxious and does not have insomnia.     PAST MEDICAL HISTORY :  Past Medical History:  Diagnosis Date   Arthritis     Collagen vascular disease (HCC)    GERD (gastroesophageal reflux disease)    Headache(784.0)    HFrEF (heart failure with reduced ejection fraction) (HCC)    Nonischemic cardiomyopathy, LVEF as low as 30-35% in 08/2019   History of methotrexate  therapy    Hyperlipidemia    hx   Lymphadenopathy of head and neck 01/2015   see on Thyroid  ultrasound   Lymphoma, mantle cell (HCC) 06/01/2015   bx of lymph node in right breast/Stage IV Mantle Cell Lymphoma   Multifocal pneumonia 10/05/2021   Personal history of chemotherapy    Pulmonary embolism (HCC) 10/05/2021   acute   Respiratory failure (HCC) 10/05/2021   acute   Rheumatoid arthritis (HCC)     PAST SURGICAL HISTORY :   Past Surgical History:  Procedure Laterality Date   BREAST BIOPSY Left 11/07/2020   u/s bx-hydromark #3 "coil"-path pending   CARDIAC CATHETERIZATION  09/2004   [facility]; EF 60%   CARDIAC CATHETERIZATION  08/2004   [facility]   IR FLUORO GUIDED NEEDLE PLC ASPIRATION/INJECTION LOC  06/18/2018   PERIPHERAL VASCULAR CATHETERIZATION N/A 07/04/2015   Procedure: Melville Stade Cath Insertion;  Surgeon: Celso College, MD;  Location: [facility] INVASIVE CV LAB;  Service: Cardiovascular;  Laterality: N/A;   PORTA CATH REMOVAL N/A 06/23/2018   Procedure: PORTA CATH REMOVAL;  Surgeon: Celso College, MD;  Location: [facility] INVASIVE CV LAB;  Service: Cardiovascular;  Laterality: N/A;   RIGHT/LEFT HEART CATH AND CORONARY ANGIOGRAPHY Bilateral 09/20/2019   Procedure: RIGHT/LEFT HEART CATH AND CORONARY ANGIOGRAPHY;  Surgeon: Sammy Crisp, MD;  Location: [facility] INVASIVE CV LAB;  Service: Cardiovascular;  Laterality: Bilateral;    FAMILY HISTORY :   Family History  Problem Relation Age of Onset   Diabetes Mother    Cholelithiasis Mother    Hypertension Sister    Diabetes Sister    Heart murmur Sister    Arthritis Brother    Breast cancer Neg Hx     SOCIAL HISTORY:   Social History   Tobacco Use   Smoking status: Never   Smokeless tobacco: Never   Vaping Use   Vaping status: Never Used  Substance Use Topics   Alcohol use: No   Drug use: No    ALLERGIES:  has no known allergies.  MEDICATIONS:  Current Outpatient Medications  Medication Sig Dispense Refill   acalabrutinib  maleate (CALQUENCE ) 100 MG tablet Take 1 tablet (100 mg total) by mouth 2 (two) times daily. 60 tablet 2   acetaminophen  (TYLENOL ) 500 MG tablet Take 1,000 mg by mouth every 6 (six) hours as needed for mild pain.      albuterol  (VENTOLIN  HFA) 108 (90 Base) MCG/ACT inhaler 2 puffs inhaled orally every 4 to 6 hours as needed, not to exceed 12 puffs in 24 hours 18 each 1   apixaban  (ELIQUIS ) 5 MG TABS tablet TAKE 1 TABLET BY MOUTH TWICE A DAY 60 tablet 5   carvedilol  (COREG ) 3.125 MG tablet TAKE 1 TABLET BY MOUTH TWICE A DAY 180 tablet 1   feeding supplement (ENSURE ENLIVE / ENSURE PLUS) LIQD Take 237 mLs by mouth 3 (three) times daily between meals. 237 mL 12   fluticasone  (FLONASE ) 50 MCG/ACT nasal spray PLACE 2 SPRAYS INTO BOTH NOSTRILS DAILY AS NEEDED FOR ALLERGIES. 48 mL 1   furosemide  (LASIX ) 40 MG  tablet TAKE 1 TABLET BY MOUTH EVERY DAY 90 tablet 2   ipratropium-albuterol  (DUONEB) 0.5-2.5 (3) MG/3ML SOLN Take 3 mLs by nebulization every 6 (six) hours as needed. 300 mL 2   KLOR-CON  M10 10 MEQ tablet TAKE 1 TABLET BY MOUTH EVERY DAY 90 tablet 1   losartan  (COZAAR ) 25 MG tablet TAKE 1/2 TABLET BY MOUTH EVERY DAY 45 tablet 0   MEGARED OMEGA-3 KRILL OIL PO Take 1 tablet by mouth daily.     Multiple Vitamins-Minerals (CENTRUM SILVER 50+WOMEN) TABS Take 1 tablet by mouth daily.     Nintedanib (OFEV ) 150 MG CAPS Take 1 capsule (150 mg total) by mouth 2 (two) times daily. 180 capsule 1   ondansetron  (ZOFRAN ) 8 MG tablet ONE PILL EVERY 8 HOURS AS NEEDED FOR NAUSEA/VOMITTING. 40 tablet 1   OXYGEN  Inhale into the lungs. 2 Liters 24/7     rosuvastatin  (CRESTOR ) 5 MG tablet TAKE 1 TABLET (5 MG TOTAL) BY MOUTH DAILY. 90 tablet 3   No current facility-administered  medications for this visit.   Facility-Administered Medications Ordered in Other Visits  Medication Dose Route Frequency Provider Last Rate Last Admin   heparin  lock flush 100 unit/mL  500 Units Intravenous Once Bemnet Trovato R, MD       sodium chloride  flush (NS) 0.9 % injection 10 mL  10 mL Intravenous Once Emlyn Maves R, MD       sodium chloride  flush (NS) 0.9 % injection 10 mL  10 mL Intravenous Once Armetta Henri R, MD        PHYSICAL EXAMINATION: ECOG PERFORMANCE STATUS: 0 - Asymptomatic  BP 109/70 (BP Location: Left Arm, Patient Position: Sitting, Cuff Size: Large)   Pulse 67   Temp (!) 97.5 F (36.4 C) (Tympanic)   Resp 16   Wt 225 lb (102.1 kg)   SpO2 100%   BMI 35.24 kg/m   Filed Weights   09/25/23 1019  Weight: 225 lb (102.1 kg)       Physical Exam HENT:     Head: Normocephalic and atraumatic.     Mouth/Throat:     Pharynx: No oropharyngeal exudate.  Eyes:     Pupils: Pupils are equal, round, and reactive to light.  Cardiovascular:     Rate and Rhythm: Normal rate and regular rhythm.  Pulmonary:     Effort: No respiratory distress.     Breath sounds: No wheezing.     Comments: Bilateral basilar crackles Abdominal:     General: Bowel sounds are normal. There is no distension.     Palpations: Abdomen is soft. There is no mass.     Tenderness: There is no abdominal tenderness. There is no guarding or rebound.  Musculoskeletal:        General: No tenderness. Normal range of motion.     Cervical back: Normal range of motion and neck supple.  Skin:    General: Skin is warm.  Neurological:     Mental Status: She is alert and oriented to person, place, and time.  Psychiatric:        Mood and Affect: Affect normal.    LABORATORY DATA:  I have reviewed the data as listed    Component Value Date/Time   NA 136 09/25/2023 1002   NA 145 (H) 09/08/2019 1359   NA 142 09/14/2013 1127   K 3.7 09/25/2023 1002   K 3.5 09/14/2013 1127   CL  102 09/25/2023 1002   CL 110 (H) 09/14/2013 1127  CO2 25 09/25/2023 1002   CO2 29 09/14/2013 1127   GLUCOSE 95 09/25/2023 1002   GLUCOSE 106 (H) 09/14/2013 1127   BUN 16 09/25/2023 1002   BUN 15 09/08/2019 1359   BUN 8 09/14/2013 1127   CREATININE 0.70 09/25/2023 1002   CREATININE 0.71 09/14/2013 1127   CREATININE 0.68 04/01/2013 1550   CALCIUM  9.0 09/25/2023 1002   CALCIUM  8.6 09/14/2013 1127   PROT 6.9 09/25/2023 1002   PROT 7.6 09/14/2013 1127   ALBUMIN 3.8 09/25/2023 1002   ALBUMIN 3.2 (L) 09/14/2013 1127   AST 21 09/25/2023 1002   ALT 13 09/25/2023 1002   ALT 19 09/14/2013 1127   ALKPHOS 70 09/25/2023 1002   ALKPHOS 76 09/14/2013 1127   BILITOT 0.5 09/25/2023 1002   GFRNONAA >60 09/25/2023 1002   GFRNONAA >60 09/14/2013 1127   GFRAA >60 01/20/2020 1303   GFRAA >60 09/14/2013 1127    No results found for: "SPEP", "UPEP"  Lab Results  Component Value Date   WBC 8.2 09/25/2023   NEUTROABS 5.9 09/25/2023   HGB 12.9 09/25/2023   HCT 40.6 09/25/2023   MCV 96.4 09/25/2023   PLT 210 09/25/2023      Chemistry      Component Value Date/Time   NA 136 09/25/2023 1002   NA 145 (H) 09/08/2019 1359   NA 142 09/14/2013 1127   K 3.7 09/25/2023 1002   K 3.5 09/14/2013 1127   CL 102 09/25/2023 1002   CL 110 (H) 09/14/2013 1127   CO2 25 09/25/2023 1002   CO2 29 09/14/2013 1127   BUN 16 09/25/2023 1002   BUN 15 09/08/2019 1359   BUN 8 09/14/2013 1127   CREATININE 0.70 09/25/2023 1002   CREATININE 0.71 09/14/2013 1127   CREATININE 0.68 04/01/2013 1550      Component Value Date/Time   CALCIUM  9.0 09/25/2023 1002   CALCIUM  8.6 09/14/2013 1127   ALKPHOS 70 09/25/2023 1002   ALKPHOS 76 09/14/2013 1127   AST 21 09/25/2023 1002   ALT 13 09/25/2023 1002   ALT 19 09/14/2013 1127   BILITOT 0.5 09/25/2023 1002       RADIOGRAPHIC STUDIES: I have personally reviewed the radiological images as listed and agreed with the findings in the report. No results found.    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 No findings for recurrent lymphoma.     # Currently on Calequence 100 mg twice a day [ AUG 1st, 2022]-  stable.  Will repeat a PET scan in couple of months.  Ordered today.  #  Mild anemia-  continue gentle iron.  # March 31st, 2023- right upper lobe pulmonary artery branches  consistent with pulmonary embolism- OFF eliquis  [until dec 2024]-   stable.  # Immunocompromised state: Hx Multifocal pneumonia-[April 2023]-Given the multiple pneumonias/high risk of death-recommended continuation of further rituximab . MARCH 2024- IgG- 800.  Monitor for now. Stable.  # NICMP- [35-40%%- July 28th 2021]--  EVAL; NO CRT [Dr.End/Dr.Klein]July 2022- 2D echo 40 to 45% ejection fraction-  continue lasix  20 mg/day [sec to dizzy spells]; on coreg - defer to cardiology- stable. Continue lasix  40 mg/day- as per Cards.   #Chronic respiratory failure -2 L home O2 -NICMP/pulmonary hypertension/chronic bilateral lung scarring-followed by pulmonary [Dr.Meier.[place]]; MAY 2024- HRCT-  Pulmonary parenchymal pattern  of fibrosis, with interval resolution of previously seen superimposed ground-glass- consistent with UIP. S/p pulmonary - sep, 2024  Dr.Dgyali- Tolerating Nintenaib- defer  to with pulmonary. Discussed with Dr.Gonzalez.   # Hx of Rheumatoid arthritis-given ILD-  s/p evaluation with  Rheumatology- stable.   # Left shoulder pain/ Chronic arthritis-on meloxicam  as needed- stable.  #Vaccinations : s/p flu shot- discussed r: COVID booster/RSV; ? Pneumonia vaccination [check with PCP]  # DISPOSITION:  # follow up-MD in 2 months -/friday  MD ;labs- cbc/cmp/ldh/BNP; quantitative immunoglobulin]; PET scan prior-Dr.B    Orders Placed This  Encounter  Procedures   NM PET Image Restage (PS) Skull Base to Thigh (F-18 FDG)    Standing Status:   Future    Expected Date:   11/25/2023    Expiration Date:   09/24/2024    If indicated for the ordered procedure, I authorize the administration of a radiopharmaceutical per Radiology protocol:   Yes    Preferred imaging location?:   [facility]   CBC with Differential (Cancer Center Only)    Standing Status:   Future    Expected Date:   11/27/2023    Expiration Date:   09/24/2024   CMP (Cancer Center only)    Standing Status:   Future    Expected Date:   11/27/2023    Expiration Date:   09/24/2024   Lactate dehydrogenase    Standing Status:   Future    Expected Date:   11/27/2023    Expiration Date:   09/24/2024   Brain natriuretic peptide    Standing Status:   Future    Expected Date:   11/27/2023    Expiration Date:   09/24/2024   Immunoglobulins, QN, A/E/G/M    Standing Status:   Future    Expected Date:   11/27/2023    Expiration Date:   09/24/2024     All questions were answered. The patient knows to call the clinic with any problems, questions or concerns.      Gwyn Leos, MD 09/25/2023 11:16 AM

## 2023-09-25 NOTE — Assessment & Plan Note (Addendum)
#  2016-2017- Mantle cell lymphoma recurrent biopsy-proven [July 2022]; Currently on Calquence  [ DISCONTINUED  Rituximab -given the multifocal pneumonia]-  PET scan NOV 11th, 2024- Stable PET-CT. No findings for recurrent lymphoma.     # Currently on Calequence 100 mg twice a day [ AUG 1st, 2022]-  stable.  Will repeat a PET scan in couple of months.  Ordered today.  #  Mild anemia-  continue gentle iron.  # March 31st, 2023- right upper lobe pulmonary artery branches  consistent with pulmonary embolism- OFF eliquis  [until dec 2024]-   stable.  # Immunocompromised state: Hx Multifocal pneumonia-[April 2023]-Given the multiple pneumonias/high risk of death-recommended continuation of further rituximab . MARCH 2024- IgG- 800.  Monitor for now. Stable.  # NICMP- [35-40%%- July 28th 2021]--  EVAL; NO CRT [Dr.End/Dr.Klein]July 2022- 2D echo 40 to 45% ejection fraction-  continue lasix  20 mg/day [sec to dizzy spells]; on coreg - defer to cardiology- stable. Continue lasix  40 mg/day- as per Cards.   #Chronic respiratory failure -2 L home O2 -NICMP/pulmonary hypertension/chronic bilateral lung scarring-followed by pulmonary [Dr.Meier.[place]]; MAY 2024- HRCT-  Pulmonary parenchymal pattern of fibrosis, with interval resolution of previously seen superimposed ground-glass- consistent with UIP. S/p pulmonary - sep, 2024  Dr.Dgyali- Tolerating Nintenaib- defer  to with pulmonary. Discussed with Dr.Gonzalez.   # Hx of Rheumatoid arthritis-given ILD-  s/p evaluation with  Rheumatology- stable.   # Left shoulder pain/ Chronic arthritis-on meloxicam  as needed- stable.  #Vaccinations : s/p flu shot- discussed r: COVID booster/RSV; ? Pneumonia vaccination [check with PCP]  # DISPOSITION:  # follow up-MD in 2 months -/friday  MD ;labs- cbc/cmp/ldh/BNP; quantitative immunoglobulin]; PET scan prior-Dr.B

## 2023-09-25 NOTE — Telephone Encounter (Signed)
 Copied from CRM [id number]. Topic: Clinical - Prescription Issue >> Sep 25, 2023 12:49 PM Hilton Lucky wrote: Reason for CRM: Patient calling to report she has stopped taking Nintedanib (OFEV ) 150 MG CAPS , as it is making her sick. Patient requesting an alternative.

## 2023-09-25 NOTE — Progress Notes (Signed)
 Patient here for follow up. Patient has some concerns with Nintedanib medication. Patient states she is unable to take this medication. Patient complains makes it dizzy, nausea, vomiting, diarrhea and sand weakness. Patient stopped meds x 2 weeks

## 2023-09-30 LAB — IMMUNOGLOBULINS A/E/G/M, SERUM
IgA: 109 mg/dL (ref 64–422)
IgE (Immunoglobulin E), Serum: <2 [IU]/mL — ABNORMAL LOW (ref 6–495)
IgG (Immunoglobin G), Serum: 847 mg/dL (ref 586–1602)
IgM (Immunoglobulin M), Srm: 11 mg/dL — ABNORMAL LOW (ref 26–217)

## 2023-09-30 NOTE — Telephone Encounter (Signed)
 I spoke with the patient she said she had nausea, vomiting, diarrhea, and dizziness when taking the Ofev . She did stop the medication and her symptoms resolved.   Dr. Darnelle Elders please advise.

## 2023-10-02 NOTE — Telephone Encounter (Signed)
 I spoke with the patient and scheduled her an appt for 513 at 9:45am.  Nothing further needed.

## 2023-10-04 ENCOUNTER — Encounter: Payer: Self-pay | Admitting: Student in an Organized Health Care Education/Training Program

## 2023-10-06 DIAGNOSIS — J849 Interstitial pulmonary disease, unspecified: Secondary | ICD-10-CM | POA: Diagnosis not present

## 2023-10-06 DIAGNOSIS — J8410 Pulmonary fibrosis, unspecified: Secondary | ICD-10-CM | POA: Diagnosis not present

## 2023-10-06 DIAGNOSIS — Z7960 Long term (current) use of unspecified immunomodulators and immunosuppressants: Secondary | ICD-10-CM | POA: Diagnosis not present

## 2023-10-06 DIAGNOSIS — M0579 Rheumatoid arthritis with rheumatoid factor of multiple sites without organ or systems involvement: Secondary | ICD-10-CM | POA: Diagnosis not present

## 2023-10-06 DIAGNOSIS — M0510 Rheumatoid lung disease with rheumatoid arthritis of unspecified site: Secondary | ICD-10-CM | POA: Diagnosis not present

## 2023-10-06 DIAGNOSIS — M150 Primary generalized (osteo)arthritis: Secondary | ICD-10-CM | POA: Diagnosis not present

## 2023-10-07 ENCOUNTER — Other Ambulatory Visit: Payer: Self-pay

## 2023-10-07 ENCOUNTER — Other Ambulatory Visit: Payer: Self-pay | Admitting: Pharmacy Technician

## 2023-10-07 ENCOUNTER — Other Ambulatory Visit (HOSPITAL_COMMUNITY): Payer: Self-pay

## 2023-10-07 NOTE — Progress Notes (Signed)
 Specialty Pharmacy Refill Coordination Note  Michelle [name] is a 83 y.o. female contacted today regarding refills of specialty medication(s) Acalabrutinib  Maleate (Calquence )   Patient requested Delivery   Delivery date: 10/09/23   Verified address: [name] [place] [address]   Medication will be filled on 10/08/23.

## 2023-10-13 ENCOUNTER — Encounter: Payer: Self-pay | Admitting: Student in an Organized Health Care Education/Training Program

## 2023-10-13 ENCOUNTER — Ambulatory Visit: Admitting: Student in an Organized Health Care Education/Training Program

## 2023-10-13 VITALS — BP 120/80 | HR 56 | Temp 97.1°F | Ht 67.0 in | Wt 225.0 lb

## 2023-10-13 DIAGNOSIS — R0602 Shortness of breath: Secondary | ICD-10-CM

## 2023-10-13 DIAGNOSIS — J849 Interstitial pulmonary disease, unspecified: Secondary | ICD-10-CM

## 2023-10-13 DIAGNOSIS — M069 Rheumatoid arthritis, unspecified: Secondary | ICD-10-CM

## 2023-10-13 MED ORDER — IPRATROPIUM-ALBUTEROL 0.5-2.5 (3) MG/3ML IN SOLN: 3.0000 mL | Freq: Four times a day (QID) | RESPIRATORY_TRACT | 11 refills | Status: AC | PRN

## 2023-10-13 NOTE — Progress Notes (Signed)
 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 I have reviewed her previous imaging studies that showed underlying UIP pattern with subpleural reticulation and honeycombing. This is again confirmed on the most recent HRCT from March of 2025 showing stable fibrosis.   She was previously immune suppressed with Rituximab , with persistence of her COVID-19 infection between March and May of 2023. CT imaging at that time was notable for the underlying UIP with additional ground glass opacities suggesting active inflammation. B-Cell depletion with anti-CD-20 monoclonal antibodies has been associated with persistent COVID infections with multiple underlying patterns of lung damage (including organizing pneumonia, diffuse alveolar damage, etc...). She did not have any tissue diagnosis and the underlying pattern is difficult to ascertain.   Her most recent HRCT was noted for a UIP pattern. She has positive RF, anti-CCP, ANA, and anti-Ro antibodies and was referred to rheumatology, now started on Hydroxychloroquine  200 mg twice daily. We also attempted started nintedanib 150 mg twice daily which she poorly tolerated due to GI side effects. Discussed this again at length during today's clinic and explored re-initiated nintedanib at a lower dose of 100 mg bid which the patient is amenable to.   We will re-order PFT's to attempt and establish an FVC/TLC baseline. Will repeat blood work prior to follow up.  -continue with O2 via nasal cannula with exertion -continue apixaban  -will initiate nintedanib twice daily, lower dose to 100 mg bid - ipratropium-albuterol  (DUONEB) 0.5-2.5 (3) MG/3ML SOLN; Take 3 mLs by  nebulization every 6 (six) hours as needed.  Dispense: 300 mL; Refill: 11 - Pulmonary Function Test; Future   Return in about 3 months (around 01/13/2024).  I spent 32 minutes caring for this patient today, including preparing to see the patient, obtaining a medical history , reviewing a separately obtained history, performing a medically appropriate examination and/or evaluation, counseling and educating the patient/family/caregiver, ordering medications, tests, or procedures, documenting clinical information in the electronic health record, and independently interpreting results (not separately reported/billed) and communicating results to the patient/family/caregiver  Michelle Glasgow, MD [facility]   End of visit medications:  Meds ordered this encounter  Medications   ipratropium-albuterol  (DUONEB) 0.5-2.5 (3) MG/3ML SOLN    Sig: Take 3 mLs by nebulization every 6 (six) hours as needed.    Dispense:  300 mL    Refill:  11     Current Outpatient Medications:    acalabrutinib  maleate (CALQUENCE ) 100 MG tablet, Take 1 tablet (100 mg total) by mouth 2 (two) times daily., Disp: 60 tablet, Rfl: 2   acetaminophen  (TYLENOL ) 500 MG tablet, Take 1,000 mg by mouth every 6 (six) hours as needed for mild pain. , Disp: , Rfl:    albuterol  (VENTOLIN  HFA) 108 (90 Base) MCG/ACT inhaler, 2 puffs inhaled orally every 4 to 6 hours as needed, not to exceed 12 puffs in 24 hours, Disp: 18 each, Rfl: 1   apixaban  (ELIQUIS ) 5 MG TABS tablet, TAKE 1 TABLET BY MOUTH TWICE A DAY, Disp: 60 tablet, Rfl: 5   carvedilol  (COREG ) 3.125 MG tablet, TAKE 1 TABLET BY MOUTH TWICE A DAY, Disp: 180 tablet, Rfl: 1   feeding supplement (ENSURE ENLIVE / ENSURE PLUS) LIQD, Take 237 mLs by mouth 3 (three) times  daily between meals., Disp: 237 mL, Rfl: 12   fluticasone  (FLONASE ) 50 MCG/ACT nasal spray, PLACE 2 SPRAYS INTO BOTH NOSTRILS DAILY AS NEEDED FOR ALLERGIES., Disp: 48 mL, Rfl: 1   furosemide  (LASIX ) 40  MG tablet, TAKE 1 TABLET BY MOUTH EVERY DAY, Disp: 90 tablet, Rfl: 2   hydroxychloroquine  (PLAQUENIL ) 200 MG tablet, Take 1 tablet by mouth 2 (two) times daily., Disp: , Rfl:    KLOR-CON  M10 10 MEQ tablet, TAKE 1 TABLET BY MOUTH EVERY DAY, Disp: 90 tablet, Rfl: 1   losartan  (COZAAR ) 25 MG tablet, TAKE 1/2 TABLET BY MOUTH EVERY DAY, Disp: 45 tablet, Rfl: 0   MEGARED OMEGA-3 KRILL OIL PO, Take 1 tablet by mouth daily., Disp: , Rfl:    Multiple Vitamins-Minerals (CENTRUM SILVER 50+WOMEN) TABS, Take 1 tablet by mouth daily., Disp: , Rfl:    ondansetron  (ZOFRAN ) 8 MG tablet, ONE PILL EVERY 8 HOURS AS NEEDED FOR NAUSEA/VOMITTING., Disp: 40 tablet, Rfl: 1   OXYGEN , Inhale into the lungs. 2 Liters 24/7, Disp: , Rfl:    rosuvastatin  (CRESTOR ) 5 MG tablet, TAKE 1 TABLET (5 MG TOTAL) BY MOUTH DAILY., Disp: 90 tablet, Rfl: 3   ipratropium-albuterol  (DUONEB) 0.5-2.5 (3) MG/3ML SOLN, Take 3 mLs by nebulization every 6 (six) hours as needed., Disp: 300 mL, Rfl: 11   Nintedanib (OFEV ) 150 MG CAPS, Take 1 capsule (150 mg total) by mouth 2 (two) times daily. (Patient not taking: Reported on 10/13/2023), Disp: 180 capsule, Rfl: 1 No current facility-administered medications for this visit.  Facility-Administered Medications Ordered in Other Visits:    heparin  lock flush 100 unit/mL, 500 Units, Intravenous, Once, Brahmanday, Govinda R, MD   sodium chloride  flush (NS) 0.9 % injection 10 mL, 10 mL, Intravenous, Once, Brahmanday, Govinda R, MD   sodium chloride  flush (NS) 0.9 % injection 10 mL, 10 mL, Intravenous, Once, Brahmanday, Govinda R, MD   Subjective:   PATIENT ID: Michelle [name] GENDER: female DOB: 1940/12/06, MRN: [id number]  Chief Complaint  Patient presents with   Follow-up    DOE. No wheezing. Cough when she talks a lot.    HPI  Patient is a pleasant 83 year old female with a history of RA and ILD who presents for follow up today.  We initiated Nintedanib following our last visit which she  tolerated poorly. She developed severe GI symptoms with diarrhea and N/V and discontinued it because of that. She was seen by Dr. Lydia Sams from Rheumatology and given the diagnosis of RA she is now started on Hydroxychloroquine . She feels much better from a joint pain side of things. She also feels like she has more energy. Her shortness of breath is at baseline and she uses oxygen  nocturnally and with significant exertion.   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 She was admitted with acute hypoxic respiratory failure to [facility] in March of 2023. She was found to have COVID with the persistence of viral detection. She was treated with steroids, antibiotics and molnupravir. At that point, she was treated with Rituximab . She was also found to have an acute PE treated with Apixaban . She was discharged home on oxygen  therapy with a prednisone  taper and diuretics. She was re-admitted in May of 2023 again with worsening respiratory failure and diagnosed with multi-focal pneumonia. Patient again treated with antibiotics and steroids. COVID was still positive during said admission in May of 2023.    Imaging during  her prior admission was noted underlying fibrotic lung disease (UIP pattern) with super imposed ground glass opacities. She was then seen by Dr. Herschell Lore in clinic and prescribed a prolonged prednisone  taper and repeat HRCT Most recent HRCT from was end of March, 2025 that shows stable UIP pattern of fibrosis. She was unable to get her PFT's done as she found them difficult to finish.   Patient is followed closely by oncology for Mantle cell lymphoma. She follows with Dr. Valentine Gasmen. Patient is maintained on Acalabrutinib . Rituximab  had been discontinued for over a year now. She also follows with Dr. Nolan Battle from cardiology regarding NICM (EF as low as 30% in 2021, recovered to 60% in 2023) and is maintained on goal  directed medical therapy. She continues on Apixaban  for management of her VTE disease.   Past medical history notable for NICM, HFpEF, OSA on CPAP, RA, GERD, and Stage IV mantle cell lymphoma. She reports being diagnosed with RA by Dr. Ivette Marks many years ago. She is now followed by Dr. Lydia Sams from [facility]  Ancillary information including prior medications, full medical/surgical/family/social histories, and PFTs (when available) are listed below and have been reviewed.   Review of Systems  Constitutional:  Negative for chills, fever, malaise/fatigue and weight loss.  Respiratory:  Positive for shortness of breath. Negative for cough, hemoptysis, sputum production and wheezing.   Cardiovascular:  Negative for chest pain and palpitations.     Objective:   Vitals:   10/13/23 1155  BP: 120/80  Pulse: (!) 56  Temp: (!) 97.1 F (36.2 C)  SpO2: 98%  Weight: 225 lb (102.1 kg)  Height: 5\' 7"  (1.702 m)   98% on RA BMI Readings from Last 3 Encounters:  10/13/23 35.24 kg/m  09/25/23 35.24 kg/m  08/18/23 36.02 kg/m   Wt Readings from Last 3 Encounters:  10/13/23 225 lb (102.1 kg)  09/25/23 225 lb (102.1 kg)  08/18/23 230 lb (104.3 kg)    Physical Exam Constitutional:      Appearance: Normal appearance. She is obese.  Cardiovascular:     Rate and Rhythm: Normal rate and regular rhythm.     Pulses: Normal pulses.     Heart sounds: Normal heart sounds.  Pulmonary:     Effort: Pulmonary effort is normal.     Breath sounds: Rales present. No wheezing.  Neurological:     General: No focal deficit present.     Mental Status: She is alert and oriented to person, place, and time. Mental status is at baseline.       Ancillary Information    Past Medical History:  Diagnosis Date   Arthritis    Collagen vascular disease (HCC)    GERD (gastroesophageal reflux disease)    Headache(784.0)    HFrEF (heart failure with reduced ejection fraction) (HCC)    Nonischemic  cardiomyopathy, LVEF as low as 30-35% in 08/2019   History of methotrexate  therapy    Hyperlipidemia    hx   Lymphadenopathy of head and neck 01/2015   see on Thyroid  ultrasound   Lymphoma, mantle cell (HCC) 06/01/2015   bx of lymph node in right breast/Stage IV Mantle Cell Lymphoma   Multifocal pneumonia 10/05/2021   Personal history of chemotherapy    Pulmonary embolism (HCC) 10/05/2021   acute   Respiratory failure (HCC) 10/05/2021   acute   Rheumatoid arthritis (HCC)      Family History  Problem Relation Age of Onset   Diabetes Mother    Cholelithiasis Mother  Hypertension Sister    Diabetes Sister    Heart murmur Sister    Arthritis Brother    Breast cancer Neg Hx      Past Surgical History:  Procedure Laterality Date   BREAST BIOPSY Left 11/07/2020   u/s bx-hydromark #3 "coil"-path pending   CARDIAC CATHETERIZATION  09/2004   [facility]; EF 60%   CARDIAC CATHETERIZATION  08/2004   [facility]   IR FLUORO GUIDED NEEDLE PLC ASPIRATION/INJECTION LOC  06/18/2018   PERIPHERAL VASCULAR CATHETERIZATION N/A 07/04/2015   Procedure: Melville Stade Cath Insertion;  Surgeon: Celso College, MD;  Location: [facility] INVASIVE CV LAB;  Service: Cardiovascular;  Laterality: N/A;   PORTA CATH REMOVAL N/A 06/23/2018   Procedure: PORTA CATH REMOVAL;  Surgeon: Celso College, MD;  Location: [facility] INVASIVE CV LAB;  Service: Cardiovascular;  Laterality: N/A;   RIGHT/LEFT HEART CATH AND CORONARY ANGIOGRAPHY Bilateral 09/20/2019   Procedure: RIGHT/LEFT HEART CATH AND CORONARY ANGIOGRAPHY;  Surgeon: Sammy Crisp, MD;  Location: [facility] INVASIVE CV LAB;  Service: Cardiovascular;  Laterality: Bilateral;    Social History   Socioeconomic History   Marital status: Married    Spouse name: Not on file   Number of children: Not on file   Years of education: Not on file   Highest education level: Not on file  Occupational History   Not on file  Tobacco Use   Smoking status: Never   Smokeless tobacco: Never  Vaping Use    Vaping status: Never Used  Substance and Sexual Activity   Alcohol use: No   Drug use: No   Sexual activity: Never  Other Topics Concern   Not on file  Social History Narrative   ** Merged History Encounter **       Lives in [place]. Works as Clinical biochemist.   Social Drivers of Corporate investment banker Strain: Low Risk  (12/03/2022)   Overall Financial Resource Strain (CARDIA)    Difficulty of Paying Living Expenses: Not hard at all  Food Insecurity: No Food Insecurity (12/03/2022)   Hunger Vital Sign    Worried About Running Out of Food in the Last Year: Never true    Ran Out of Food in the Last Year: Never true  Transportation Needs: No Transportation Needs (12/03/2022)   PRAPARE - Administrator, Civil Service (Medical): No    Lack of Transportation (Non-Medical): No  Physical Activity: Inactive (12/03/2022)   Exercise Vital Sign    Days of Exercise per Week: 0 days    Minutes of Exercise per Session: 0 min  Stress: No Stress Concern Present (12/03/2022)   [employer] of Occupational Health - Occupational Stress Questionnaire    Feeling of Stress : Not at all  Social Connections: Moderately Integrated (12/03/2022)   Social Connection and Isolation Panel [NHANES]    Frequency of Communication with Friends and Family: More than three times a week    Frequency of Social Gatherings with Friends and Family: More than three times a week    Attends Religious Services: More than 4 times per year    Active Member of [employer] or Organizations: Yes    Attends Banker Meetings: More than 4 times per year    Marital Status: Widowed  Intimate Partner Violence: Not At Risk (12/03/2022)   Humiliation, Afraid, Rape, and Kick questionnaire    Fear of Current or Ex-Partner: No    Emotionally Abused: No    Physically Abused: No    Sexually Abused:  No     No Known Allergies   CBC    Component Value Date/Time   WBC 8.2 09/25/2023 1002   WBC 10.5 05/22/2023 0932    RBC 4.21 09/25/2023 1002   HGB 12.9 09/25/2023 1002   HGB 13.4 09/08/2019 1359   HCT 40.6 09/25/2023 1002   HCT 40.9 09/08/2019 1359   PLT 210 09/25/2023 1002   PLT 194 09/08/2019 1359   MCV 96.4 09/25/2023 1002   MCV 90 09/08/2019 1359   MCV 89 09/14/2013 1127   MCH 30.6 09/25/2023 1002   MCHC 31.8 09/25/2023 1002   RDW 14.1 09/25/2023 1002   RDW 13.9 09/08/2019 1359   RDW 15.1 (H) 09/14/2013 1127   LYMPHSABS 1.7 09/25/2023 1002   LYMPHSABS 1.3 09/08/2019 1359   MONOABS 0.4 09/25/2023 1002   EOSABS 0.1 09/25/2023 1002   EOSABS 0.1 09/08/2019 1359   BASOSABS 0.0 09/25/2023 1002   BASOSABS 0.0 09/08/2019 1359    Pulmonary Functions Testing Results:    Latest Ref Rng & Units 05/20/2023    9:38 AM 11/19/2021    1:33 PM  PFT Results  FVC-Pre L 0.89  1.24   FVC-Predicted Pre % 29  50   FVC-Post L  1.27   FVC-Predicted Post %  52   Pre FEV1/FVC % % 100  82   Post FEV1/FCV % %  91   FEV1-Pre L 0.89  1.02   FEV1-Predicted Pre % 39  53   FEV1-Post L  1.15   TLC L  3.08   TLC % Predicted %  55   RV % Predicted %  61     Outpatient Medications Prior to Visit  Medication Sig Dispense Refill   acalabrutinib  maleate (CALQUENCE ) 100 MG tablet Take 1 tablet (100 mg total) by mouth 2 (two) times daily. 60 tablet 2   acetaminophen  (TYLENOL ) 500 MG tablet Take 1,000 mg by mouth every 6 (six) hours as needed for mild pain.      albuterol  (VENTOLIN  HFA) 108 (90 Base) MCG/ACT inhaler 2 puffs inhaled orally every 4 to 6 hours as needed, not to exceed 12 puffs in 24 hours 18 each 1   apixaban  (ELIQUIS ) 5 MG TABS tablet TAKE 1 TABLET BY MOUTH TWICE A DAY 60 tablet 5   carvedilol  (COREG ) 3.125 MG tablet TAKE 1 TABLET BY MOUTH TWICE A DAY 180 tablet 1   feeding supplement (ENSURE ENLIVE / ENSURE PLUS) LIQD Take 237 mLs by mouth 3 (three) times daily between meals. 237 mL 12   fluticasone  (FLONASE ) 50 MCG/ACT nasal spray PLACE 2 SPRAYS INTO BOTH NOSTRILS DAILY AS NEEDED FOR ALLERGIES. 48 mL  1   furosemide  (LASIX ) 40 MG tablet TAKE 1 TABLET BY MOUTH EVERY DAY 90 tablet 2   hydroxychloroquine  (PLAQUENIL ) 200 MG tablet Take 1 tablet by mouth 2 (two) times daily.     KLOR-CON  M10 10 MEQ tablet TAKE 1 TABLET BY MOUTH EVERY DAY 90 tablet 1   losartan  (COZAAR ) 25 MG tablet TAKE 1/2 TABLET BY MOUTH EVERY DAY 45 tablet 0   MEGARED OMEGA-3 KRILL OIL PO Take 1 tablet by mouth daily.     Multiple Vitamins-Minerals (CENTRUM SILVER 50+WOMEN) TABS Take 1 tablet by mouth daily.     ondansetron  (ZOFRAN ) 8 MG tablet ONE PILL EVERY 8 HOURS AS NEEDED FOR NAUSEA/VOMITTING. 40 tablet 1   OXYGEN  Inhale into the lungs. 2 Liters 24/7     rosuvastatin  (CRESTOR ) 5 MG tablet TAKE 1 TABLET (5 MG  TOTAL) BY MOUTH DAILY. 90 tablet 3   ipratropium-albuterol  (DUONEB) 0.5-2.5 (3) MG/3ML SOLN Take 3 mLs by nebulization every 6 (six) hours as needed. 300 mL 2   Nintedanib (OFEV ) 150 MG CAPS Take 1 capsule (150 mg total) by mouth 2 (two) times daily. (Patient not taking: Reported on 10/13/2023) 180 capsule 1   Facility-Administered Medications Prior to Visit  Medication Dose Route Frequency Provider Last Rate Last Admin   heparin  lock flush 100 unit/mL  500 Units Intravenous Once Brahmanday, Govinda R, MD       sodium chloride  flush (NS) 0.9 % injection 10 mL  10 mL Intravenous Once Brahmanday, Govinda R, MD       sodium chloride  flush (NS) 0.9 % injection 10 mL  10 mL Intravenous Once Brahmanday, Govinda R, MD

## 2023-10-15 ENCOUNTER — Telehealth: Payer: Self-pay

## 2023-10-15 ENCOUNTER — Other Ambulatory Visit (HOSPITAL_COMMUNITY): Payer: Self-pay

## 2023-10-15 DIAGNOSIS — J849 Interstitial pulmonary disease, unspecified: Secondary | ICD-10-CM

## 2023-10-15 MED ORDER — OFEV 100 MG PO CAPS: 100.0000 mg | ORAL_CAPSULE | Freq: Two times a day (BID) | ORAL | 1 refills | Status: DC

## 2023-10-15 NOTE — Telephone Encounter (Signed)
 Received Ofev  new start paperwork via On[employer].  Submitted a Prior Authorization request to [employer] for OFEV  via CoverMyMeds. Will update once we receive a response.  Key: [id number]   Per automated response: This medication or product was previously approved on [id number] from 2023-08-26 to 2024-06-01  Test claim shows $0 copay for 30 day supply  Geraldene Kleine, PharmD, MPH, BCPS, CPP Clinical Pharmacist (Rheumatology and Pulmonology)

## 2023-10-22 DIAGNOSIS — J9601 Acute respiratory failure with hypoxia: Secondary | ICD-10-CM | POA: Diagnosis not present

## 2023-10-22 DIAGNOSIS — G4733 Obstructive sleep apnea (adult) (pediatric): Secondary | ICD-10-CM | POA: Diagnosis not present

## 2023-10-22 DIAGNOSIS — I2699 Other pulmonary embolism without acute cor pulmonale: Secondary | ICD-10-CM | POA: Diagnosis not present

## 2023-11-02 ENCOUNTER — Other Ambulatory Visit: Payer: Self-pay

## 2023-11-04 ENCOUNTER — Other Ambulatory Visit: Payer: Self-pay

## 2023-11-04 ENCOUNTER — Other Ambulatory Visit: Payer: Self-pay | Admitting: Internal Medicine

## 2023-11-04 DIAGNOSIS — C8311 Mantle cell lymphoma, lymph nodes of head, face, and neck: Secondary | ICD-10-CM

## 2023-11-04 MED ORDER — CALQUENCE 100 MG PO TABS
100.0000 mg | ORAL_TABLET | Freq: Two times a day (BID) | ORAL | 2 refills | Status: DC
  Filled 2023-11-04: qty 60, 30d supply, fill #0
  Filled 2023-11-27 – 2023-12-01 (×3): qty 60, 30d supply, fill #1
  Filled 2023-12-30: qty 60, 30d supply, fill #2

## 2023-11-04 NOTE — Progress Notes (Signed)
 Specialty Pharmacy Refill Coordination Note  Michelle [name] is a 83 y.o. female contacted today regarding refills of specialty medication(s) Acalabrutinib  Maleate (Calquence )   Patient requested Delivery   Delivery date: 11/06/23   Verified address: [name] [place] [address]   Medication will be filled on 06.05.25.   This fill date is pending response to refill request from provider. Patient is aware and if they have not received fill by intended date they must follow up with pharmacy.

## 2023-11-09 ENCOUNTER — Other Ambulatory Visit: Payer: Self-pay

## 2023-11-09 NOTE — Progress Notes (Signed)
 Clinical Intervention Note  Clinical Intervention Notes: Patient reports initiating Ofev . No DDIs identified with Calquence .   Clinical Intervention Outcomes: Prevention of an adverse drug event   Rena Carnes Specialty Pharmacist

## 2023-11-22 DIAGNOSIS — I2699 Other pulmonary embolism without acute cor pulmonale: Secondary | ICD-10-CM | POA: Diagnosis not present

## 2023-11-22 DIAGNOSIS — J9601 Acute respiratory failure with hypoxia: Secondary | ICD-10-CM | POA: Diagnosis not present

## 2023-11-22 DIAGNOSIS — G4733 Obstructive sleep apnea (adult) (pediatric): Secondary | ICD-10-CM | POA: Diagnosis not present

## 2023-11-24 ENCOUNTER — Other Ambulatory Visit (HOSPITAL_COMMUNITY): Payer: Self-pay

## 2023-11-25 ENCOUNTER — Ambulatory Visit
Admission: RE | Admit: 2023-11-25 | Discharge: 2023-11-25 | Disposition: A | Discharge status: Home / Self Care | Source: Ambulatory Visit | Attending: Internal Medicine | Admitting: Internal Medicine

## 2023-11-25 DIAGNOSIS — I700 Atherosclerosis of aorta: Secondary | ICD-10-CM | POA: Insufficient documentation

## 2023-11-25 DIAGNOSIS — E119 Type 2 diabetes mellitus without complications: Secondary | ICD-10-CM | POA: Insufficient documentation

## 2023-11-25 DIAGNOSIS — I2510 Atherosclerotic heart disease of native coronary artery without angina pectoris: Secondary | ICD-10-CM | POA: Diagnosis not present

## 2023-11-25 DIAGNOSIS — C8311 Mantle cell lymphoma, lymph nodes of head, face, and neck: Secondary | ICD-10-CM | POA: Diagnosis not present

## 2023-11-25 DIAGNOSIS — K8020 Calculus of gallbladder without cholecystitis without obstruction: Secondary | ICD-10-CM | POA: Insufficient documentation

## 2023-11-25 DIAGNOSIS — C8590 Non-Hodgkin lymphoma, unspecified, unspecified site: Secondary | ICD-10-CM | POA: Diagnosis not present

## 2023-11-25 DIAGNOSIS — R918 Other nonspecific abnormal finding of lung field: Secondary | ICD-10-CM | POA: Diagnosis not present

## 2023-11-25 LAB — GLUCOSE, CAPILLARY: Glucose-Capillary: 92 mg/dL (ref 70–99)

## 2023-11-25 MED ORDER — FLUDEOXYGLUCOSE F - 18 (FDG) INJECTION
11.7000 | Freq: Once | INTRAVENOUS | Status: AC | PRN
  Administered 2023-11-25: 12.44 via INTRAVENOUS

## 2023-11-27 ENCOUNTER — Other Ambulatory Visit (HOSPITAL_COMMUNITY): Payer: Self-pay

## 2023-11-27 ENCOUNTER — Other Ambulatory Visit: Payer: Self-pay

## 2023-12-01 ENCOUNTER — Other Ambulatory Visit: Payer: Self-pay

## 2023-12-01 ENCOUNTER — Other Ambulatory Visit (HOSPITAL_COMMUNITY): Payer: Self-pay

## 2023-12-01 NOTE — Progress Notes (Signed)
 Specialty Pharmacy Refill Coordination Note  Michelle [name] is a 83 y.o. female contacted today regarding refills of specialty medication(s) Acalabrutinib  Maleate (Calquence )   Patient requested Delivery   Delivery date: 12/03/23   Verified address: [name] [place] [address]   Medication will be filled on 12/02/23.

## 2023-12-07 ENCOUNTER — Ambulatory Visit (INDEPENDENT_AMBULATORY_CARE_PROVIDER_SITE_OTHER): Payer: 59 | Admitting: *Deleted

## 2023-12-07 VITALS — Ht 68.0 in | Wt 228.0 lb

## 2023-12-07 DIAGNOSIS — Z0000 Encounter for general adult medical examination without abnormal findings: Secondary | ICD-10-CM

## 2023-12-07 NOTE — Patient Instructions (Signed)
 Michelle [name] , Thank you for taking time out of your busy schedule to complete your Annual Wellness Visit with me. I enjoyed our conversation and look forward to speaking with you again next year. I, as well as your care team,  appreciate your ongoing commitment to your health goals. Please review the following plan we discussed and let me know if I can assist you in the future. Your Game plan/ To Do List    Referrals: If you haven't heard from the office you've been referred to, please reach out to them at the phone provided.  Remember to get your annual flu and covid vaccines Follow up Visits: Next Medicare AWV with our clinical staff: 12/12/24 @ 11:30   Have you seen your provider in the last 6 months (3 months if uncontrolled diabetes)? No Next Office Visit with your provider: 12/15/23  Clinician Recommendations:  Aim for 30 minutes of exercise or brisk walking, 6-8 glasses of water, and 5 servings of fruits and vegetables each day.       This is a list of the screening recommended for you and due dates:  Health Maintenance  Topic Date Due   COVID-19 Vaccine (8 - Pfizer risk 2024-25 season) 08/06/2023   Flu Shot  01/01/2024   Medicare Annual Wellness Visit  12/06/2024   DTaP/Tdap/Td vaccine (3 - Td or Tdap) 02/05/2033   Pneumococcal Vaccine for age over 80  Completed   DEXA scan (bone density measurement)  Completed   Zoster (Shingles) Vaccine  Completed   Hepatitis B Vaccine  Aged Out   HPV Vaccine  Aged Out   Meningitis B Vaccine  Aged Out   Colon Cancer Screening  Discontinued   Hepatitis C Screening  Discontinued    Advanced directives: (Declined) Advance directive discussed with you today. Even though you declined this today, please call our office should you change your mind, and we can give you the proper paperwork for you to fill out. Advance Care Planning is important because it:  [x]  Makes sure you receive the medical care that is consistent with your values, goals, and  preferences  [x]  It provides guidance to your family and loved ones and reduces their decisional burden about whether or not they are making the right decisions based on your wishes.

## 2023-12-07 NOTE — Progress Notes (Signed)
 Subjective:   Michelle [name] is a 83 y.o. who presents for a Medicare Wellness preventive visit.  As a reminder, Annual Wellness Visits don't include a physical exam, and some assessments may be limited, especially if this visit is performed virtually. We may recommend an in-person follow-up visit with your provider if needed.  Visit Complete: Virtual I connected with  Michelle [name] on 12/07/23 by a audio enabled telemedicine application and verified that I am speaking with the correct person using two identifiers.  Patient Location: Home  Provider Location: Home Office  I discussed the limitations of evaluation and management by telemedicine. The patient expressed understanding and agreed to proceed.  Vital Signs: Because this visit was a virtual/telehealth visit, some criteria may be missing or patient reported. Any vitals not documented were not able to be obtained and vitals that have been documented are patient reported.  VideoDeclined- This patient declined Librarian, academic. Therefore the visit was completed with audio only.  Persons Participating in Visit: Patient.  AWV Questionnaire: No: Patient Medicare AWV questionnaire was not completed prior to this visit.  Cardiac Risk Factors include: advanced age (>39men, >50 women);obesity (BMI >30kg/m2);dyslipidemia;Other (see comment), Risk factor comments: HFpEF     Objective:    Today's Vitals   12/07/23 1132  Weight: 228 lb (103.4 kg)  Height: 5' 8 (1.727 m)   Body mass index is 34.67 kg/m.     12/07/2023   11:49 AM 09/25/2023   10:13 AM 05/22/2023    9:29 AM 03/20/2023   10:30 AM 01/16/2023    9:49 AM 12/03/2022    9:36 AM 11/14/2022   10:30 AM  Advanced Directives  Does Patient Have a Medical Advance Directive? No No No No No No No  Would patient like information on creating a medical advance directive? No - Patient declined  No - Patient declined  No - Patient declined  No -  Patient declined    Current Medications (verified) Outpatient Encounter Medications as of 12/07/2023  Medication Sig   acalabrutinib  maleate (CALQUENCE ) 100 MG tablet Take 1 tablet (100 mg total) by mouth 2 (two) times daily.   acetaminophen  (TYLENOL ) 500 MG tablet Take 1,000 mg by mouth every 6 (six) hours as needed for mild pain.    albuterol  (VENTOLIN  HFA) 108 (90 Base) MCG/ACT inhaler 2 puffs inhaled orally every 4 to 6 hours as needed, not to exceed 12 puffs in 24 hours   apixaban  (ELIQUIS ) 5 MG TABS tablet TAKE 1 TABLET BY MOUTH TWICE A DAY   carvedilol  (COREG ) 3.125 MG tablet TAKE 1 TABLET BY MOUTH TWICE A DAY   feeding supplement (ENSURE ENLIVE / ENSURE PLUS) LIQD Take 237 mLs by mouth 3 (three) times daily between meals.   fluticasone  (FLONASE ) 50 MCG/ACT nasal spray PLACE 2 SPRAYS INTO BOTH NOSTRILS DAILY AS NEEDED FOR ALLERGIES.   furosemide  (LASIX ) 40 MG tablet TAKE 1 TABLET BY MOUTH EVERY DAY   hydroxychloroquine  (PLAQUENIL ) 200 MG tablet Take 1 tablet by mouth 2 (two) times daily.   ipratropium-albuterol  (DUONEB) 0.5-2.5 (3) MG/3ML SOLN Take 3 mLs by nebulization every 6 (six) hours as needed.   KLOR-CON  M10 10 MEQ tablet TAKE 1 TABLET BY MOUTH EVERY DAY   losartan  (COZAAR ) 25 MG tablet TAKE 1/2 TABLET BY MOUTH EVERY DAY   MEGARED OMEGA-3 KRILL OIL PO Take 1 tablet by mouth daily.   Multiple Vitamins-Minerals (CENTRUM SILVER 50+WOMEN) TABS Take 1 tablet by mouth daily.   Nintedanib (OFEV )  100 MG CAPS Take 1 capsule (100 mg total) by mouth 2 (two) times daily. **note dose decrease**   ondansetron  (ZOFRAN ) 8 MG tablet ONE PILL EVERY 8 HOURS AS NEEDED FOR NAUSEA/VOMITTING.   OXYGEN  Inhale into the lungs. 2 Liters 24/7   rosuvastatin  (CRESTOR ) 5 MG tablet TAKE 1 TABLET (5 MG TOTAL) BY MOUTH DAILY.   Facility-Administered Encounter Medications as of 12/07/2023  Medication   heparin  lock flush 100 unit/mL   sodium chloride  flush (NS) 0.9 % injection 10 mL   sodium chloride  flush (NS)  0.9 % injection 10 mL    Allergies (verified) Patient has no known allergies.   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   Multifocal pneumonia 10/05/2021   Personal history of chemotherapy    Pulmonary embolism (HCC) 10/05/2021   acute   Respiratory failure (HCC) 10/05/2021   acute   Rheumatoid arthritis (HCC)    Past Surgical History:  Procedure Laterality Date   BREAST BIOPSY Left 11/07/2020   u/s bx-hydromark #3 coil-path pending   CARDIAC CATHETERIZATION  09/2004   [facility]; EF 60%   CARDIAC CATHETERIZATION  08/2004   [facility]   IR FLUORO GUIDED NEEDLE PLC ASPIRATION/INJECTION LOC  06/18/2018   PERIPHERAL VASCULAR CATHETERIZATION N/A 07/04/2015   Procedure: Pat Cath Insertion;  Surgeon: Selinda GORMAN Gu, MD;  Location: [facility] INVASIVE CV LAB;  Service: Cardiovascular;  Laterality: N/A;   PORTA CATH REMOVAL N/A 06/23/2018   Procedure: PORTA CATH REMOVAL;  Surgeon: Gu Selinda GORMAN, MD;  Location: [facility] INVAS[facility];  Service: Cardiovascular;  Laterality: N/A;   RIGHT/LEFT HEART CATH AND CORONARY ANGIOGRAPHY Bilateral 09/20/2019   Procedure: RIGHT/LEFT HEART CATH AND CORONARY ANGIOGRAPHY;  Surgeon: Mady Bruckner, MD;  Location: [facility] INVASIVE CV [facility];  Service: Cardiovascular;  Laterality: Bilateral;   Family History  Problem Relation Age of Onset   Diabetes Mother    Cholelithiasis Mother    Hypertension Sister    Diabetes Sister    Heart murmur Sister    Arthritis Brother    Breast cancer Neg Hx    Social  History   Socioeconomic History   Marital status: Married    Spouse name: Not on file   Number of children: Not on file   Years of education: Not on file   Highest education level: Not on file  Occupational History   Not on file  Tobacco Use   Smoking status: Never   Smokeless tobacco: Never  Vaping Use   Vaping status: Never Used  Substance and Sexual Activity   Alcohol use: No   Drug use: No   Sexual activity: Never  Other Topics Concern   Not on file  Social History Narrative   ** Merged History Encounter **       Lives in [place]. Works as Clinical biochemist.   Social Drivers of Health   Financial Resource Strain: Low Risk  (12/07/2023)   Overall Financial Resource Strain (CARDIA)  Difficulty of Paying Living Expenses: Not hard at all  Food Insecurity: No Food Insecurity (12/07/2023)   Hunger Vital Sign    Worried About Running Out of Food in the Last Year: Never true    Ran Out of Food in the Last Year: Never true  Transportation Needs: No Transportation Needs (12/07/2023)   PRAPARE - Administrator, Civil Service (Medical): No    Lack of Transportation (Non-Medical): No  Physical Activity: Inactive (12/07/2023)   Exercise Vital Sign    Days of Exercise per Week: 0 days    Minutes of Exercise per Session: 0 min  Stress: No Stress Concern Present (12/07/2023)   [employer] of Occupational Health - Occupational Stress Questionnaire    Feeling of Stress: Only a little  Social Connections: Moderately Integrated (12/07/2023)   Social Connection and Isolation Panel    Frequency of Communication with Friends and Family: More than three times a week    Frequency of Social Gatherings with Friends and Family: More than three times a week    Attends Religious Services: More than 4 times per year    Active Member of [employer] or Organizations: Yes    Attends Banker Meetings: More than 4 times per year    Marital Status: Widowed    Tobacco  Counseling Counseling given: Not Answered    Clinical Intake:  Pre-visit preparation completed: Yes  Pain : No/denies pain     BMI - recorded: 34.67 Nutritional Status: BMI > 30  Obese Nutritional Risks: None Diabetes: No  Lab Results  Component Value Date   HGBA1C 6.1 01/12/2014   HGBA1C 6.0 10/13/2011     How often do you need to have someone help you when you read instructions, pamphlets, or other written materials from your doctor or pharmacy?: 1 - Never  Interpreter Needed?: No  Information entered by :: R. Camar Guyton LPN   Activities of Daily Living     12/07/2023   11:34 AM  In your present state of health, do you have any difficulty performing the following activities:  Hearing? 0  Vision? 0  Comment readers  Difficulty concentrating or making decisions? 0  Walking or climbing stairs? 1  Dressing or bathing? 0  Doing errands, shopping? 1  Preparing Food and eating ? N  Using the Toilet? N  In the past six months, have you accidently leaked urine? N  Do you have problems with loss of bowel control? N  Managing your Medications? N  Managing your Finances? N  Housekeeping or managing your Housekeeping? Y    Patient Care Team: Dineen Rollene MATSU, FNP as PCP - General (Family Medicine) End, Lonni, MD as PCP - Cardiology (Cardiology) Fernande Elspeth BROCKS, MD as PCP - Electrophysiology (Cardiology) [name], MD (Internal Medicine) Rennie Cindy SAUNDERS, MD as Consulting Physician (Internal Medicine)  I have updated your Care Teams any recent Medical Services you may have received from other providers in the past year.     Assessment:   This is a routine wellness examination for [place].  Hearing/Vision screen Hearing Screening - Comments:: No issues Vision Screening - Comments:: readers   Goals Addressed             This Visit's Progress    Patient Stated       Wants to get into exercising and a program        Depression Screen      12/07/2023   11:44 AM 04/20/2023  11:01 AM 04/20/2023   10:54 AM 12/03/2022    9:30 AM 02/20/2022   10:27 AM 11/27/2021    2:33 PM 11/15/2021    3:12 PM  PHQ 2/9 Scores  PHQ - 2 Score 0 0 0 0 0 0 0  PHQ- 9 Score 2   0  0 0    Fall Risk     12/07/2023   11:38 AM 04/20/2023   10:54 AM 11/27/2021    2:32 PM 11/15/2021    3:17 PM 10/02/2021    8:41 AM  Fall Risk   Falls in the past year? 0 0 0 0 0  Number falls in past yr: 0 0 0  0  Injury with Fall? 0 0 0  0  Risk for fall due to : No Fall Risks No Fall Risks No Fall Risks Impaired balance/gait No Fall Risks  Risk for fall due to: Comment    Walker/Cane   Follow up Falls evaluation completed;Falls prevention discussed Falls evaluation completed Falls evaluation completed  Education provided  Falls evaluation completed      Data saved with a previous flowsheet row definition    MEDICARE RISK AT HOME:  Medicare Risk at Home Any stairs in or around the home?: Yes If so, are there any without handrails?: No Home free of loose throw rugs in walkways, pet beds, electrical cords, etc?: Yes Adequate lighting in your home to reduce risk of falls?: Yes Life alert?: Yes Use of a cane, walker or w/c?: Yes Grab bars in the bathroom?: Yes Shower chair or bench in shower?: Yes Elevated toilet seat or a handicapped toilet?: Yes  TIMED UP AND GO:  Was the test performed?  No  Cognitive Function: 6CIT completed    11/07/2016    3:37 PM  MMSE - Mini Mental State Exam  Orientation to time 5   Orientation to Place 5   Registration 3   Attention/ Calculation 3   Attention/Calculation-comments Some difficulty calculating    Recall 3   Language- name 2 objects 2   Language- repeat 1  Language- follow 3 step command 3   Language- read & follow direction 1   Write a sentence 1   Copy design 1   Total score 28      Data saved with a previous flowsheet row definition        12/07/2023   11:49 AM 12/03/2022    9:37 AM 11/14/2019   10:45  AM 11/12/2018   10:10 AM 11/09/2017    3:29 PM  6CIT Screen  What Year? 0 points 0 points 0 points 0 points 0 points  What month? 0 points 0 points 0 points 0 points 0 points  What time? 0 points 0 points 0 points 0 points 0 points  Count back from 20 0 points 2 points 0 points 0 points 0 points  Months in reverse 0 points 4 points   4 points  Repeat phrase 0 points 0 points   0 points  Total Score 0 points 6 points   4 points    Immunizations Immunization History  Administered Date(s) Administered   Fluad Quad(high Dose 65+) 03/21/2022   Influenza Split 02/18/2012   Influenza, High Dose Seasonal PF 01/29/2017, 02/19/2018, 02/23/2020   Influenza,inj,Quad PF,6+ Mos 04/01/2013, 03/02/2014, 06/25/2015   Influenza-Unspecified 03/14/2016, 02/23/2019   PFIZER(Purple Top)SARS-COV-2 Vaccination 07/18/2019, 08/08/2019, 03/24/2020, 08/07/2020   PNEUMOCOCCAL CONJUGATE-20 07/17/2022   Pneumococcal Conjugate-13 01/12/2014   Pneumococcal Polysaccharide-23 11/12/2009   Tdap 11/12/2009  Zoster Recombinant(Shingrix) 03/15/2019, 05/20/2019    Screening Tests Health Maintenance  Topic Date Due   DTaP/Tdap/Td (2 - Td or Tdap) 11/13/2019   COVID-19 Vaccine (5 - 2024-25 season) 02/01/2023   Medicare Annual Wellness (AWV)  12/03/2023   INFLUENZA VACCINE  01/01/2024   Pneumococcal Vaccine: 50+ Years  Completed   DEXA SCAN  Completed   Zoster Vaccines- Shingrix  Completed   Hepatitis B Vaccines  Aged Out   HPV VACCINES  Aged Out   Meningococcal B Vaccine  Aged Out   Colonoscopy  Discontinued   Hepatitis C Screening  Discontinued    Health Maintenance  Health Maintenance Due  Topic Date Due   DTaP/Tdap/Td (2 - Td or Tdap) 11/13/2019   COVID-19 Vaccine (5 - 2024-25 season) 02/01/2023   Medicare Annual Wellness (AWV)  12/03/2023   Health Maintenance Items Addressed: Discussed the need to have annual flu and covid vaccines  Additional Screening:  Vision Screening: Recommended annual  ophthalmology exams for early detection of glaucoma and other disorders of the eye. Overdue  [facility]  Patient stated that she will call and schedule an appointment Would you like a referral to an eye doctor? No    Dental Screening: Recommended annual dental exams for proper oral hygiene  Community Resource Referral / Chronic Care Management: CRR required this visit?  No   CCM required this visit?  No   Plan:    I have personally reviewed and noted the following in the patient's chart:   Medical and social history Use of alcohol, tobacco or illicit drugs  Current medications and supplements including opioid prescriptions. Patient is not currently taking opioid prescriptions. Functional ability and status Nutritional status Physical activity Advanced directives List of other physicians Hospitalizations, surgeries, and ER visits in previous 12 months Vitals Screenings to include cognitive, depression, and falls Referrals and appointments  In addition, I have reviewed and discussed with patient certain preventive protocols, quality metrics, and best practice recommendations. A written personalized care plan for preventive services as well as general preventive health recommendations were provided to patient.   Angeline Fredericks, LPN   07/04/7972   After Visit Summary: (MyChart) Due to this being a telephonic visit, the after visit summary with patients personalized plan was offered to patient via MyChart   Notes: Nothing significant to report at this time.

## 2023-12-10 ENCOUNTER — Ambulatory Visit: Admitting: Student in an Organized Health Care Education/Training Program

## 2023-12-11 ENCOUNTER — Inpatient Hospital Stay: Admitting: Internal Medicine

## 2023-12-11 ENCOUNTER — Inpatient Hospital Stay: Attending: Internal Medicine

## 2023-12-11 ENCOUNTER — Encounter: Payer: Self-pay | Admitting: Internal Medicine

## 2023-12-11 VITALS — BP 99/64 | HR 65 | Temp 97.3°F | Resp 20 | Ht 68.0 in | Wt 226.8 lb

## 2023-12-11 DIAGNOSIS — I272 Pulmonary hypertension, unspecified: Secondary | ICD-10-CM | POA: Insufficient documentation

## 2023-12-11 DIAGNOSIS — D649 Anemia, unspecified: Secondary | ICD-10-CM | POA: Diagnosis not present

## 2023-12-11 DIAGNOSIS — Z79899 Other long term (current) drug therapy: Secondary | ICD-10-CM | POA: Insufficient documentation

## 2023-12-11 DIAGNOSIS — C8311 Mantle cell lymphoma, lymph nodes of head, face, and neck: Secondary | ICD-10-CM | POA: Diagnosis not present

## 2023-12-11 DIAGNOSIS — Z86711 Personal history of pulmonary embolism: Secondary | ICD-10-CM | POA: Diagnosis not present

## 2023-12-11 DIAGNOSIS — M25512 Pain in left shoulder: Secondary | ICD-10-CM | POA: Insufficient documentation

## 2023-12-11 DIAGNOSIS — M069 Rheumatoid arthritis, unspecified: Secondary | ICD-10-CM | POA: Diagnosis not present

## 2023-12-11 DIAGNOSIS — I428 Other cardiomyopathies: Secondary | ICD-10-CM | POA: Insufficient documentation

## 2023-12-11 DIAGNOSIS — I255 Ischemic cardiomyopathy: Secondary | ICD-10-CM | POA: Diagnosis not present

## 2023-12-11 DIAGNOSIS — Z7901 Long term (current) use of anticoagulants: Secondary | ICD-10-CM | POA: Diagnosis not present

## 2023-12-11 DIAGNOSIS — J9610 Chronic respiratory failure, unspecified whether with hypoxia or hypercapnia: Secondary | ICD-10-CM | POA: Diagnosis not present

## 2023-12-11 DIAGNOSIS — Z9221 Personal history of antineoplastic chemotherapy: Secondary | ICD-10-CM | POA: Insufficient documentation

## 2023-12-11 LAB — LACTATE DEHYDROGENASE: LDH: 166 U/L (ref 98–192)

## 2023-12-11 LAB — CBC WITH DIFFERENTIAL (CANCER CENTER ONLY)
Abs Immature Granulocytes: 0.03 K/uL (ref 0.00–0.07)
Basophils Absolute: 0 K/uL (ref 0.0–0.1)
Basophils Relative: 0 %
Eosinophils Absolute: 0.1 K/uL (ref 0.0–0.5)
Eosinophils Relative: 1 %
HCT: 39.7 % (ref 36.0–46.0)
Hemoglobin: 12.8 g/dL (ref 12.0–15.0)
Immature Granulocytes: 0 %
Lymphocytes Relative: 20 %
Lymphs Abs: 1.5 K/uL (ref 0.7–4.0)
MCH: 30.8 pg (ref 26.0–34.0)
MCHC: 32.2 g/dL (ref 30.0–36.0)
MCV: 95.4 fL (ref 80.0–100.0)
Monocytes Absolute: 0.4 K/uL (ref 0.1–1.0)
Monocytes Relative: 5 %
Neutro Abs: 5.4 K/uL (ref 1.7–7.7)
Neutrophils Relative %: 74 %
Platelet Count: 179 K/uL (ref 150–400)
RBC: 4.16 MIL/uL (ref 3.87–5.11)
RDW: 13.8 % (ref 11.5–15.5)
WBC Count: 7.4 K/uL (ref 4.0–10.5)
nRBC: 0 % (ref 0.0–0.2)

## 2023-12-11 LAB — CMP (CANCER CENTER ONLY)
ALT: 15 U/L (ref 0–44)
AST: 25 U/L (ref 15–41)
Albumin: 3.7 g/dL (ref 3.5–5.0)
Alkaline Phosphatase: 62 U/L (ref 38–126)
Anion gap: 8 (ref 5–15)
BUN: 14 mg/dL (ref 8–23)
CO2: 25 mmol/L (ref 22–32)
Calcium: 9 mg/dL (ref 8.9–10.3)
Chloride: 102 mmol/L (ref 98–111)
Creatinine: 0.84 mg/dL (ref 0.44–1.00)
GFR, Estimated: >60 mL/min (ref >60)
Glucose, Bld: 92 mg/dL (ref 70–99)
Potassium: 3.8 mmol/L (ref 3.5–5.1)
Sodium: 135 mmol/L (ref 135–145)
Total Bilirubin: 0.8 mg/dL (ref 0.0–1.2)
Total Protein: 6.4 g/dL — ABNORMAL LOW (ref 6.5–8.1)

## 2023-12-11 LAB — BRAIN NATRIURETIC PEPTIDE: B Natriuretic Peptide: 102.2 pg/mL — ABNORMAL HIGH (ref 0.0–100.0)

## 2023-12-11 NOTE — Progress Notes (Signed)
  [facility] OFFICE PROGRESS NOTE  Patient Care Team: Michelle Rollene MATSU, FNP as PCP - General (Family Medicine) End, Lonni, MD as PCP - Cardiology (Cardiology) Michelle Elspeth BROCKS, MD as PCP - Electrophysiology (Cardiology) Michelle Delon LABOR, MD (Internal Medicine) Michelle Cindy SAUNDERS, MD as Consulting Physician (Internal Medicine)   Cancer Staging  No matching staging information was found for the patient.    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 Feb 9th- START Benda-Ritux with neulasta ; Prolonged neutropenia; DISCONT- Benda-Ritux;   # April 13 th 2017- START R-CHOP x1; severe/prolonged neutropenia; PET- CR; BMBx-Neg; Disc R-CHOP # June 2022- DIAGNOSIS: thickened cortex of 12 mm. An additional lymph node demonstrates a thickened cortex of 7 mm. A third lymph node demonstrates a mildly thickened cortex of 4 mm A. LYMPH NODE, LEFT AXILLA; ULTRASOUND-GUIDED BIOPSY:  - CD5+ MONOCLONAL B-CELL POPULATION; COMPATIBLE WITH INVOLVEMENT BY THE  PATIENT'S KNOWN MANTLE CELL LYMPHOMA.   #July 2022-second week-started ibrutinib  420 mg a dayx 1 week-stop because of severe rash.  Significant clinical response noted.  # # 26th MAY 2017- Start Rituxan  q 30M Main OCT 12th 2017- PET NED.    # AUG 1st, 2022- start acalbrutinib. 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 Ambulating independently/with cane.   Michelle [name] 83 y.o.  female pleasant patient multiple medical problems-ischemic cardiomyopathy; pulm hypertension; Hx of PE on eliquis  and above history of recurrent mantle cell lymphoma on Calquence ; here for follow-up/and review the results of the PET scan.  C/o ofev  making her have discomfort when she eats, and having dizziness. Taking 100 mg every day. Not taking every day due to symptoms.  No further hospitalizations. Pt has a chronic cough that is lingering.  No pain. Appetite is good.  Bowels are normal. Denies any fever or night sweats. Patient admits compliance to Calquence .    No chest pain no new lumps or bumps.  Review of Systems  Constitutional:  Negative for chills, diaphoresis, fever, malaise/fatigue and weight loss.  HENT:  Negative for nosebleeds and sore throat.   Eyes:  Negative for double vision.  Respiratory:  Negative for hemoptysis, sputum production and wheezing.   Cardiovascular:  Negative for chest pain, palpitations, orthopnea and leg swelling.  Gastrointestinal:  Positive for constipation. Negative for abdominal pain, blood in stool, diarrhea, heartburn, melena, nausea and vomiting.  Genitourinary:  Negative for dysuria, frequency and urgency.  Musculoskeletal:  Positive for back pain and joint pain.  Skin: Negative.  Negative for itching and rash.  Neurological:  Negative for dizziness, tingling, focal weakness, weakness and headaches.  Endo/Heme/Allergies:  Does not bruise/bleed easily.  Psychiatric/Behavioral:  Negative for depression. The patient is not nervous/anxious and does not have insomnia.     PAST MEDICAL HISTORY :  Past  Medical History:  Diagnosis Date   Arthritis    Collagen vascular disease (HCC)    GERD (gastroesophageal reflux disease)    Headache(784.0)    HFrEF (heart failure  with reduced ejection fraction) (HCC)    Nonischemic cardiomyopathy, LVEF as low as 30-35% in 08/2019   History of methotrexate  therapy    Hyperlipidemia    hx   Lymphadenopathy of head and neck 01/2015   see on Thyroid  ultrasound   Lymphoma, mantle cell (HCC) 06/01/2015   bx of lymph node in right breast/Stage IV Mantle Cell Lymphoma   Multifocal pneumonia 10/05/2021   Personal history of chemotherapy    Pulmonary embolism (HCC) 10/05/2021   acute   Respiratory failure (HCC) 10/05/2021   acute   Rheumatoid arthritis (HCC)     PAST SURGICAL HISTORY :   Past Surgical History:  Procedure Laterality Date   BREAST BIOPSY Left 11/07/2020   u/s bx-hydromark #3 coil-path pending   CARDIAC CATHETERIZATION  09/2004   [facility]; EF 60%   CARDIAC CATHETERIZATION  08/2004   [facility]   IR FLUORO GUIDED NEEDLE PLC ASPIRATION/INJECTION LOC  06/18/2018   PERIPHERAL VASCULAR CATHETERIZATION N/A 07/04/2015   Procedure: Pat Cath Insertion;  Surgeon: Selinda GORMAN Gu, MD;  Location: [facility] INVASIVE CV LAB;  Service: Cardiovascular;  Laterality: N/A;   PORTA CATH REMOVAL N/A 06/23/2018   Procedure: PORTA CATH REMOVAL;  Surgeon: Gu Selinda GORMAN, MD;  Location: [facility] INVASIVE CV LAB;  Service: Cardiovascular;  Laterality: N/A;   RIGHT/LEFT HEART CATH AND CORONARY ANGIOGRAPHY Bilateral 09/20/2019   Procedure: RIGHT/LEFT HEART CATH AND CORONARY ANGIOGRAPHY;  Surgeon: Mady Bruckner, MD;  Location: [facility] INVASIVE CV LAB;  Service: Cardiovascular;  Laterality: Bilateral;    FAMILY HISTORY :   Family History  Problem Relation Age of Onset   Diabetes Mother    Cholelithiasis Mother    Hypertension Sister    Diabetes Sister    Heart murmur Sister    Arthritis Brother    Breast cancer Neg Hx     SOCIAL HISTORY:   Social History   Tobacco Use   Smoking status: Never   Smokeless tobacco: Never  Vaping Use   Vaping status: Never Used  Substance Use Topics   Alcohol use: No   Drug use: No    ALLERGIES:  has no  known allergies.  MEDICATIONS:  Current Outpatient Medications  Medication Sig Dispense Refill   acalabrutinib  maleate (CALQUENCE ) 100 MG tablet Take 1 tablet (100 mg total) by mouth 2 (two) times daily. 60 tablet 2   acetaminophen  (TYLENOL ) 500 MG tablet Take 1,000 mg by mouth every 6 (six) hours as needed for mild pain.      albuterol  (VENTOLIN  HFA) 108 (90 Base) MCG/ACT inhaler 2 puffs inhaled orally every 4 to 6 hours as needed, not to exceed 12 puffs in 24 hours 18 each 1   carvedilol  (COREG ) 3.125 MG tablet TAKE 1 TABLET BY MOUTH TWICE A DAY 180 tablet 1   feeding supplement (ENSURE ENLIVE / ENSURE PLUS) LIQD Take 237 mLs by mouth 3 (three) times daily between meals. 237 mL 12   fluticasone  (FLONASE ) 50 MCG/ACT nasal spray PLACE 2 SPRAYS INTO BOTH NOSTRILS DAILY AS NEEDED FOR ALLERGIES. 48 mL 1   furosemide  (LASIX ) 40 MG tablet TAKE 1 TABLET BY MOUTH EVERY DAY 90 tablet 2   hydroxychloroquine  (PLAQUENIL ) 200 MG tablet Take 1 tablet by mouth 2 (two) times daily.     ipratropium-albuterol  (DUONEB) 0.5-2.5 (3) MG/3ML SOLN Take  3 mLs by nebulization every 6 (six) hours as needed. 300 mL 11   KLOR-CON  M10 10 MEQ tablet TAKE 1 TABLET BY MOUTH EVERY DAY 90 tablet 1   losartan  (COZAAR ) 25 MG tablet TAKE 1/2 TABLET BY MOUTH EVERY DAY 45 tablet 0   MEGARED OMEGA-3 KRILL OIL PO Take 1 tablet by mouth daily.     Multiple Vitamins-Minerals (CENTRUM SILVER 50+WOMEN) TABS Take 1 tablet by mouth daily.     Nintedanib (OFEV ) 100 MG CAPS Take 1 capsule (100 mg total) by mouth 2 (two) times daily. **note dose decrease** 180 capsule 1   ondansetron  (ZOFRAN ) 8 MG tablet ONE PILL EVERY 8 HOURS AS NEEDED FOR NAUSEA/VOMITTING. 40 tablet 1   OXYGEN  Inhale into the lungs. 2 Liters 24/7     rosuvastatin  (CRESTOR ) 5 MG tablet TAKE 1 TABLET (5 MG TOTAL) BY MOUTH DAILY. 90 tablet 3   No current facility-administered medications for this visit.   Facility-Administered Medications Ordered in Other Visits  Medication  Dose Route Frequency Provider Last Rate Last Admin   heparin  lock flush 100 unit/mL  500 Units Intravenous Once Brookley Spitler R, MD       sodium chloride  flush (NS) 0.9 % injection 10 mL  10 mL Intravenous Once Consuello Lassalle R, MD       sodium chloride  flush (NS) 0.9 % injection 10 mL  10 mL Intravenous Once Jyla Hopf R, MD        PHYSICAL EXAMINATION: ECOG PERFORMANCE STATUS: 0 - Asymptomatic  BP 99/64 (BP Location: Left Arm, Patient Position: Sitting, Cuff Size: Large)   Pulse 65   Temp (!) 97.3 F (36.3 C) (Tympanic)   Resp 20   Ht 5' 8 (1.727 m)   Wt 226 lb 12.8 oz (102.9 kg)   SpO2 93%   BMI 34.48 kg/m   Filed Weights   12/11/23 0950  Weight: 226 lb 12.8 oz (102.9 kg)       Physical Exam HENT:     Head: Normocephalic and atraumatic.     Mouth/Throat:     Pharynx: No oropharyngeal exudate.  Eyes:     Pupils: Pupils are equal, round, and reactive to light.  Cardiovascular:     Rate and Rhythm: Normal rate and regular rhythm.  Pulmonary:     Effort: No respiratory distress.     Breath sounds: No wheezing.     Comments: Bilateral basilar crackles Abdominal:     General: Bowel sounds are normal. There is no distension.     Palpations: Abdomen is soft. There is no mass.     Tenderness: There is no abdominal tenderness. There is no guarding or rebound.  Musculoskeletal:        General: No tenderness. Normal range of motion.     Cervical back: Normal range of motion and neck supple.  Skin:    General: Skin is warm.  Neurological:     Mental Status: She is alert and oriented to person, place, and time.  Psychiatric:        Mood and Affect: Affect normal.    LABORATORY DATA:  I have reviewed the data as listed    Component Value Date/Time   NA 135 12/11/2023 0954   NA 145 (H) 09/08/2019 1359   NA 142 09/14/2013 1127   K 3.8 12/11/2023 0954   K 3.5 09/14/2013 1127   CL 102 12/11/2023 0954   CL 110 (H) 09/14/2013 1127   CO2 25  12/11/2023 0954  CO2 29 09/14/2013 1127   GLUCOSE 92 12/11/2023 0954   GLUCOSE 106 (H) 09/14/2013 1127   BUN 14 12/11/2023 0954   BUN 15 09/08/2019 1359   BUN 8 09/14/2013 1127   CREATININE 0.84 12/11/2023 0954   CREATININE 0.71 09/14/2013 1127   CREATININE 0.68 04/01/2013 1550   CALCIUM  9.0 12/11/2023 0954   CALCIUM  8.6 09/14/2013 1127   PROT 6.4 (L) 12/11/2023 0954   PROT 7.6 09/14/2013 1127   ALBUMIN 3.7 12/11/2023 0954   ALBUMIN 3.2 (L) 09/14/2013 1127   AST 25 12/11/2023 0954   ALT 15 12/11/2023 0954   ALT 19 09/14/2013 1127   ALKPHOS 62 12/11/2023 0954   ALKPHOS 76 09/14/2013 1127   BILITOT 0.8 12/11/2023 0954   GFRNONAA >60 12/11/2023 0954   GFRNONAA >60 09/14/2013 1127   GFRAA >60 01/20/2020 1303   GFRAA >60 09/14/2013 1127    No results found for: SPEP, UPEP  Lab Results  Component Value Date   WBC 7.4 12/11/2023   NEUTROABS 5.4 12/11/2023   HGB 12.8 12/11/2023   HCT 39.7 12/11/2023   MCV 95.4 12/11/2023   PLT 179 12/11/2023      Chemistry      Component Value Date/Time   NA 135 12/11/2023 0954   NA 145 (H) 09/08/2019 1359   NA 142 09/14/2013 1127   K 3.8 12/11/2023 0954   K 3.5 09/14/2013 1127   CL 102 12/11/2023 0954   CL 110 (H) 09/14/2013 1127   CO2 25 12/11/2023 0954   CO2 29 09/14/2013 1127   BUN 14 12/11/2023 0954   BUN 15 09/08/2019 1359   BUN 8 09/14/2013 1127   CREATININE 0.84 12/11/2023 0954   CREATININE 0.71 09/14/2013 1127   CREATININE 0.68 04/01/2013 1550      Component Value Date/Time   CALCIUM  9.0 12/11/2023 0954   CALCIUM  8.6 09/14/2013 1127   ALKPHOS 62 12/11/2023 0954   ALKPHOS 76 09/14/2013 1127   AST 25 12/11/2023 0954   ALT 15 12/11/2023 0954   ALT 19 09/14/2013 1127   BILITOT 0.8 12/11/2023 0954       RADIOGRAPHIC STUDIES: I have personally reviewed the radiological images as listed and agreed with the findings in the report. No results found.   ASSESSMENT & PLAN:  Mantle cell lymphoma of lymph nodes of  head, face, and neck (HCC) #[id number]- Mantle cell lymphoma recurrent biopsy-proven [July 2022]; Currently on Calquence  [ DISCONTINUED  Rituximab -given the multifocal pneumonia]-  PET scan JUNE 23rd, 2025- No findings for recurrent lymphoma.     # Currently on Calequence 100 mg twice a day [ AUG 1st, 2022]-   stable.   #  Mild anemia-  continue gentle iron.  # March 31st, 2023- right upper lobe pulmonary artery branches  consistent with pulmonary embolism- OFF eliquis  [until dec 2024]-   stable.  # Immunocompromised state: Hx Multifocal pneumonia-[April 2023]-Given the multiple pneumonias/high risk of death-recommended continuation of further rituximab . MARCH 2024- IgG- 800.  Monitor for now. Stable.  # NICMP- [35-40%%- July 28th 2021]--  EVAL; NO CRT [Dr.End/Dr.Klein]July 2022- 2D echo 40 to 45% ejection fraction-  continue lasix  20 mg/day [sec to dizzy spells]; on coreg - defer to cardiology- stable. Continue lasix  40 mg/day- as per Cards.   #Chronic respiratory failure -2 L home O2 -NICMP/pulmonary hypertension/chronic bilateral lung scarring-followed by pulmonary [Dr.Meier.[place]];CT MARCH 2025- HRCT-  Pulmonary parenchymal pattern of fibrosis/ stable- S/p pulmonary - sep, 2024  Dr.Dgyali- Tolerating Nintenaib with mild abdominal discomfort- - defer  to with pulmonary  # Hx of Rheumatoid arthritis-given ILD-  s/p evaluation with  Rheumatology- stable.   # Left shoulder pain/ Chronic arthritis-on meloxicam  as needed-stable.   #Vaccinations : s/p flu shot- discussed r: COVID booster/RSV; ? Pneumonia vaccination [check with PCP]  # DISPOSITION:  # follow up-MD in 2 months -/friday  MD ;labs- cbc/cmp/ldh/BNP;- -Dr.B    Orders Placed This Encounter  Procedures   CBC with Differential (Cancer Center Only)    Standing Status:   Future    Expected Date:   02/12/2024    Expiration Date:   05/12/2024   CMP (Cancer Center only)    Standing Status:   Future    Expected Date:   02/12/2024     Expiration Date:   05/12/2024   Lactate dehydrogenase    Standing Status:   Future    Expected Date:   02/12/2024    Expiration Date:   05/12/2024   Brain natriuretic peptide    Standing Status:   Future    Expected Date:   02/12/2024    Expiration Date:   05/12/2024     All questions were answered. The patient knows to call the clinic with any problems, questions or concerns.      Cindy JONELLE Joe, MD 12/11/2023 10:44 AM

## 2023-12-11 NOTE — Assessment & Plan Note (Addendum)
#  2016-2017- Mantle cell lymphoma recurrent biopsy-proven [July 2022]; Currently on Calquence  [ DISCONTINUED  Rituximab -given the multifocal pneumonia]-  PET scan JUNE 23rd, 2025- No findings for recurrent lymphoma.     # Currently on Calequence 100 mg twice a day [ AUG 1st, 2022]-   stable.   #  Mild anemia-  continue gentle iron.  # March 31st, 2023- right upper lobe pulmonary artery branches  consistent with pulmonary embolism- OFF eliquis  [until dec 2024]-   stable.  # Immunocompromised state: Hx Multifocal pneumonia-[April 2023]-Given the multiple pneumonias/high risk of death-recommended continuation of further rituximab . MARCH 2024- IgG- 800.  Monitor for now. Stable.  # NICMP- [35-40%%- July 28th 2021]--  EVAL; NO CRT [Dr.End/Dr.Klein]July 2022- 2D echo 40 to 45% ejection fraction-  continue lasix  20 mg/day [sec to dizzy spells]; on coreg - defer to cardiology- stable. Continue lasix  40 mg/day- as per Cards.   #Chronic respiratory failure -2 L home O2 -NICMP/pulmonary hypertension/chronic bilateral lung scarring-followed by pulmonary [Dr.Meier.[place]];CT MARCH 2025- HRCT-  Pulmonary parenchymal pattern of fibrosis/ stable- S/p pulmonary - sep, 2024  Dr.Dgyali- Tolerating Nintenaib with mild abdominal discomfort- - defer  to with pulmonary  # Hx of Rheumatoid arthritis-given ILD-  s/p evaluation with  Rheumatology- stable.   # Left shoulder pain/ Chronic arthritis-on meloxicam  as needed-stable.   #Vaccinations : s/p flu shot- discussed r: COVID booster/RSV; ? Pneumonia vaccination [check with PCP]  # DISPOSITION:  # follow up-MD in 2 months -/friday  MD ;labs- cbc/cmp/ldh/BNP;- -Dr.B

## 2023-12-11 NOTE — Progress Notes (Signed)
 C/o ofev  making her have stomach pain when she eats, and having dizziness. Taking 100 mg every day. Not taking every day due to symptoms.  PET 11/25/23, CT chest 08/28/23.

## 2023-12-14 LAB — IMMUNOGLOBULINS A/E/G/M, SERUM
IgA: 100 mg/dL (ref 64–422)
IgE (Immunoglobulin E), Serum: 3 [IU]/mL — ABNORMAL LOW (ref 6–495)
IgG (Immunoglobin G), Serum: 822 mg/dL (ref 586–1602)
IgM (Immunoglobulin M), Srm: 11 mg/dL — ABNORMAL LOW (ref 26–217)

## 2023-12-15 ENCOUNTER — Ambulatory Visit (INDEPENDENT_AMBULATORY_CARE_PROVIDER_SITE_OTHER): Admitting: Family

## 2023-12-15 ENCOUNTER — Encounter: Payer: Self-pay | Admitting: Family

## 2023-12-15 VITALS — BP 126/72 | HR 65 | Temp 98.1°F | Ht 67.0 in | Wt 228.0 lb

## 2023-12-15 DIAGNOSIS — K219 Gastro-esophageal reflux disease without esophagitis: Secondary | ICD-10-CM | POA: Diagnosis not present

## 2023-12-15 DIAGNOSIS — Z136 Encounter for screening for cardiovascular disorders: Secondary | ICD-10-CM

## 2023-12-15 DIAGNOSIS — I5022 Chronic systolic (congestive) heart failure: Secondary | ICD-10-CM | POA: Diagnosis not present

## 2023-12-15 DIAGNOSIS — Z87898 Personal history of other specified conditions: Secondary | ICD-10-CM | POA: Diagnosis not present

## 2023-12-15 DIAGNOSIS — Z13220 Encounter for screening for lipoid disorders: Secondary | ICD-10-CM | POA: Diagnosis not present

## 2023-12-15 NOTE — Progress Notes (Signed)
 Assessment & Plan:  History of prediabetes -     Hemoglobin A1c  Encounter for lipid screening for cardiovascular disease -     Lipid panel  Gastroesophageal reflux disease, unspecified whether esophagitis present Assessment & Plan: Presentation wc/w GERD . Discussed cough may not be in whole be from GERD in setting of ILD, however reasonable to trial PPI otc omeprazole  for 14 days to see if meal time symptom improves. She will let me know.   Chronic HFrEF (heart failure with reduced ejection fraction) (HCC) Assessment & Plan: Chronic, euvolemic on exam. Symptomatically stable. Continue carvedilol  3.125 mg twice daily, lasix  40mg  qd. Following with Dr End, will follow.       Return precautions given.   Risks, benefits, and alternatives of the medications and treatment plan prescribed today were discussed, and patient expressed understanding.   Education regarding symptom management and diagnosis given to patient on AVS either electronically or printed.  Return in about 4 months (around 04/16/2024).  [name], FNP  Subjective:    Patient ID: NHYLA NAPPI, female    DOB: 03/10/1941, 83 y.o.   MRN: [id number]  CC: LAKEITHA BASQUES is a 83 y.o. female who presents today for follow up.   HPI: Alvis 'much better' today.  Cough has improved. Occasional cough which resolves with a sip of water.  She will have some epigastric burning 'after every meal' and then drinks a pepsi and then she burps and feels better.  She didn't bring o2 today as doesn't feel that she needs. She uses duoneb every 6 hours which is working well.   Denies CP, leg swelling, PND, constipation, vomiting, nausea  H/o GERD     F/u Dr venora 12/11/23 for mantle cell lymphoma , continued on calequence.   Chronic respiratory failure, 2L home 02 with exertion;  Last seen by Dr Philipp 10/13/23 for ILD; re initiated nintedanib ( lower dose)  Last seen cardiology 05/2023 Dr End, continued on  lasix  40mg  every day  Allergies: Patient has no known allergies. Current Outpatient Medications on File Prior to Visit  Medication Sig Dispense Refill   acalabrutinib  maleate (CALQUENCE ) 100 MG tablet Take 1 tablet (100 mg total) by mouth 2 (two) times daily. 60 tablet 2   acetaminophen  (TYLENOL ) 500 MG tablet Take 1,000 mg by mouth every 6 (six) hours as needed for mild pain.      albuterol  (VENTOLIN  HFA) 108 (90 Base) MCG/ACT inhaler 2 puffs inhaled orally every 4 to 6 hours as needed, not to exceed 12 puffs in 24 hours 18 each 1   carvedilol  (COREG ) 3.125 MG tablet TAKE 1 TABLET BY MOUTH TWICE A DAY 180 tablet 1   feeding supplement (ENSURE ENLIVE / ENSURE PLUS) LIQD Take 237 mLs by mouth 3 (three) times daily between meals. 237 mL 12   fluticasone  (FLONASE ) 50 MCG/ACT nasal spray PLACE 2 SPRAYS INTO BOTH NOSTRILS DAILY AS NEEDED FOR ALLERGIES. 48 mL 1   furosemide  (LASIX ) 40 MG tablet TAKE 1 TABLET BY MOUTH EVERY DAY 90 tablet 2   hydroxychloroquine  (PLAQUENIL ) 200 MG tablet Take 1 tablet by mouth 2 (two) times daily.     ipratropium-albuterol  (DUONEB) 0.5-2.5 (3) MG/3ML SOLN Take 3 mLs by nebulization every 6 (six) hours as needed. 300 mL 11   KLOR-CON  M10 10 MEQ tablet TAKE 1 TABLET BY MOUTH EVERY DAY 90 tablet 1   losartan  (COZAAR ) 25 MG tablet TAKE 1/2 TABLET BY MOUTH EVERY DAY 45 tablet 0  MEGARED OMEGA-3 KRILL OIL PO Take 1 tablet by mouth daily.     Multiple Vitamins-Minerals (CENTRUM SILVER 50+WOMEN) TABS Take 1 tablet by mouth daily.     Nintedanib (OFEV ) 100 MG CAPS Take 1 capsule (100 mg total) by mouth 2 (two) times daily. **note dose decrease** 180 capsule 1   ondansetron  (ZOFRAN ) 8 MG tablet ONE PILL EVERY 8 HOURS AS NEEDED FOR NAUSEA/VOMITTING. 40 tablet 1   OXYGEN  Inhale into the lungs. 2 Liters 24/7     rosuvastatin  (CRESTOR ) 5 MG tablet TAKE 1 TABLET (5 MG TOTAL) BY MOUTH DAILY. 90 tablet 3   Current Facility-Administered Medications on File Prior to Visit  Medication  Dose Route Frequency Provider Last Rate Last Admin   heparin  lock flush 100 unit/mL  500 Units Intravenous Once Brahmanday, Govinda R, MD       sodium chloride  flush (NS) 0.9 % injection 10 mL  10 mL Intravenous Once Brahmanday, Govinda R, MD       sodium chloride  flush (NS) 0.9 % injection 10 mL  10 mL Intravenous Once Brahmanday, Govinda R, MD        Review of Systems  Constitutional:  Negative for chills and fever.  HENT:  Negative for congestion and postnasal drip.   Respiratory:  Positive for cough and shortness of breath. Negative for wheezing.   Cardiovascular:  Negative for chest pain and palpitations.  Gastrointestinal:  Negative for nausea and vomiting.      Objective:    BP 126/72   Pulse 65   Temp 98.1 F (36.7 C) (Oral)   Ht 5' 7 (1.702 m)   Wt 228 lb (103.4 kg)   SpO2 96%   BMI 35.71 kg/m  BP Readings from Last 3 Encounters:  12/15/23 126/72  12/11/23 99/64  10/13/23 120/80   Wt Readings from Last 3 Encounters:  12/15/23 228 lb (103.4 kg)  12/11/23 226 lb 12.8 oz (102.9 kg)  12/07/23 228 lb (103.4 kg)    Physical Exam Vitals reviewed.  Constitutional:      Appearance: She is well-developed.  Eyes:     Conjunctiva/sclera: Conjunctivae normal.  Cardiovascular:     Rate and Rhythm: Normal rate and regular rhythm.     Pulses: Normal pulses.     Heart sounds: Normal heart sounds.  Pulmonary:     Effort: Pulmonary effort is normal.     Breath sounds: Normal breath sounds. No wheezing, rhonchi or rales.  Musculoskeletal:     Right lower leg: No edema.     Left lower leg: No edema.  Skin:    General: Skin is warm and dry.  Neurological:     Mental Status: She is alert.  Psychiatric:        Speech: Speech normal.        Behavior: Behavior normal.        Thought Content: Thought content normal.

## 2023-12-15 NOTE — Assessment & Plan Note (Signed)
 Chronic, euvolemic on exam. Symptomatically stable. Continue carvedilol  3.125 mg twice daily, lasix  40mg  qd. Following with Dr End, will follow.

## 2023-12-15 NOTE — Assessment & Plan Note (Addendum)
 Presentation wc/w GERD . Discussed cough may not be in whole be from GERD in setting of ILD, however reasonable to trial PPI otc omeprazole  for 14 days to see if meal time symptom improves. She will let me know.

## 2023-12-15 NOTE — Patient Instructions (Signed)
 I question if stomach burning is related to acid reflux  2 week trial of over the counter omeprazole  ( Prilosec) , generic is fine for TWO weeks.   Take in the morning , first thing, before you eat or drink.   GERD in Adults: Diet Changes When you have gastroesophageal reflux disease (GERD), you may need to make changes to your diet. Choosing the right foods can help with your symptoms. Think about working with an expert in healthy eating called a dietitian. They can help you make healthy food choices. What are tips for following this plan? Reading food labels Look for foods that are low in saturated fat. Foods that may help with your symptoms include: Foods with less than 5% of daily value (DV) of fat. Foods with 0 grams of trans fat. Cooking [employer] in ways that don't use a lot of fat. These ways include: Baking. Steaming. Grilling. Broiling. To add flavor, try to use herbs that are low in spice and acidity. Avoid frying your food. Meal planning  Eat small meals often rather than eating 3 large meals each day. Eat your meals slowly in a place where you feel relaxed. If told by your health care provider, avoid: Foods that cause symptoms. Keep a food diary to keep track of foods that cause symptoms. Alcohol. Drinking a lot of liquid with meals. General instructions For 2-3 hours after you eat, avoid: Bending over. Exercise. Lying down. Chew sugar-free gum after meals. What foods should I eat? Eat a healthy diet. Try to include: Foods with high amounts of fiber. These include: Fruits and vegetables. Whole grains and beans. Low-fat dairy products. Lean meats, fish, and poultry. Egg whites. Foods that cause symptoms in someone else may not cause symptoms for you. Work with your provider to find foods that are safe for you. The items listed above may not be all the foods and drinks you can have. Talk with a dietitian to learn more. The items listed above may not be a  complete list of foods and beverages you can eat and drink. Contact a dietitian for more information. What foods should I avoid? Limiting some of these foods may help with your symptoms. Each person is different. Talk with a dietitian or your provider to help you find the exact foods to avoid. Some of the foods to avoid may include: Fruits Fruits with a lot of acid in them. These may include citrus fruits, such as oranges, grapefruit, pineapple, and lemons. Vegetables Deep-fried vegetables, such as [place] fries. Vegetables, sauces, or toppings made with added fat and vegetables with acid in them. These may include tomatoes and tomato products, chili peppers, onions, garlic, and horseradish. Grains Pastries or quick breads with added fat. Meats and other proteins High-fat meats, such as fatty beef or pork, hot dogs, ribs, ham, sausage, salami, and bacon. Fried meat or protein, such as fried fish and fried chicken. Egg yolks. Fats and oils Butter. Margarine. Shortening. Ghee. Drinks Coffee and other drinks with caffeine in them. Fizzy and sugary drinks, such as soda and energy drinks. Fruit juice made with acidic fruits, such as orange or grapefruit. Tomato juice. Sweets and desserts Chocolate and cocoa. Donuts. Seasonings and condiments Mint, such as peppermint and spearmint. Condiments, herbs, or seasonings that cause symptoms. These may include curry, hot sauce, or vinegar-based salad dressings. The items listed above may not be all the foods and drinks you should avoid. Talk with a dietitian to learn more. Questions to ask  your health care provider Changes to your diet and everyday life are often the first steps taken to manage symptoms of GERD. If these changes don't help, talk with your provider about taking medicines. Where to find more information [employer]: aboutgerd.org This information is not intended to replace advice given to you by  your health care provider. Make sure you discuss any questions you have with your health care provider. Document Revised: 03/31/2023 Document Reviewed: 10/15/2022 Elsevier Patient Education  2024 [employer].

## 2023-12-22 ENCOUNTER — Other Ambulatory Visit (HOSPITAL_COMMUNITY): Payer: Self-pay

## 2023-12-22 DIAGNOSIS — J9601 Acute respiratory failure with hypoxia: Secondary | ICD-10-CM | POA: Diagnosis not present

## 2023-12-22 DIAGNOSIS — G4733 Obstructive sleep apnea (adult) (pediatric): Secondary | ICD-10-CM | POA: Diagnosis not present

## 2023-12-22 DIAGNOSIS — I2699 Other pulmonary embolism without acute cor pulmonale: Secondary | ICD-10-CM | POA: Diagnosis not present

## 2023-12-23 ENCOUNTER — Encounter: Payer: Self-pay | Admitting: Internal Medicine

## 2023-12-23 ENCOUNTER — Ambulatory Visit: Attending: Internal Medicine | Admitting: Internal Medicine

## 2023-12-23 VITALS — BP 105/60 | HR 62 | Ht 67.0 in | Wt 228.4 lb

## 2023-12-23 DIAGNOSIS — J9611 Chronic respiratory failure with hypoxia: Secondary | ICD-10-CM | POA: Insufficient documentation

## 2023-12-23 DIAGNOSIS — J849 Interstitial pulmonary disease, unspecified: Secondary | ICD-10-CM | POA: Diagnosis not present

## 2023-12-23 DIAGNOSIS — I5032 Chronic diastolic (congestive) heart failure: Secondary | ICD-10-CM | POA: Diagnosis not present

## 2023-12-23 DIAGNOSIS — I428 Other cardiomyopathies: Secondary | ICD-10-CM

## 2023-12-23 NOTE — Patient Instructions (Signed)

## 2023-12-23 NOTE — Progress Notes (Signed)
 Cardiology Office Note:  .   Date:  12/23/2023  ID:  Michelle, [name] 1941/02/26, MRN [id number] PCP: Dineen Rollene MATSU, FNP  [facility] Providers Cardiologist:  Lonni Hanson, MD Electrophysiologist:  Elspeth Sage, MD     History of Present Illness: .   Michelle [name] is a 83 y.o. female with history of chronic HFrEF due to nonischemic cardiomyopathy with recovered ejection fraction (LVEF as low as 30-35% in 08/2019, most recently 60-65% by echo in 08/2021), pulmonary embolism, rheumatoid arthritis, mantle cell lymphoma, hyperlipidemia, and GERD, who presents for follow-up of nonischemic cardiomyopathy.  I last saw her in 05/2023, at which time she complained of some shortness of breath.  She had recently been evaluated by 2 pulmonologist and had completed a course of prednisone .  Her weight was up a bit, which she attributed to prednisone  use and dietary indiscretion.  She did not believe that she was retaining fluid.  We agreed to repeat an echocardiogram and continue her current dose of furosemide  40 mg daily.  Echo showed stable LVEF of 45-50% with grade 1 diastolic dysfunction and mild to moderate TR.  Today, Michelle [name] reports that she feels fairly well.  She still has exertional dyspnea but is actually a little bit better than at prior visits.  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 Isadora but does not like the medicine as it makes her sick and lazy.  She also gets a little lightheaded with it.  She sometimes will skip it for a day or two due to side effects.  She feels like it is helping her breathing, however.  She denies chest pain, palpitations, and edema.  She does not check her blood pressure regularly at home.  ROS: See HPI  Studies Reviewed: SABRA   EKG Interpretation Date/Time:  Wednesday December 23 2023 13:07:29 EDT Ventricular Rate:  62 PR Interval:  166 QRS Duration:  150 QT Interval:  432 QTC Calculation: 438 R  Axis:   -43  Text Interpretation: Normal sinus rhythm Left axis deviation Left bundle branch block Abnormal ECG When compared with ECG of 20-May-2023 10:12, No significant change was found Confirmed by Saveah Bahar [phone]) on 12/23/2023 1:14:13 PM    TTE (06/22/2023): Moderately dilated left ventricle with LVEF 45-50%.  Global hypokinesis and grade 1 diastolic dysfunction noted.  Normal RV size and function.  Mild left atrial enlargement.  No pericardial effusion.  Mild-moderate tricuspid regurgitation.  Unable to estimate CVP and PA pressure.  Risk Assessment/Calculations:             Physical Exam:   VS:  BP 105/60 (BP Location: Left Arm, Patient Position: Sitting, Cuff Size: Large)   Pulse 62   Ht 5' 7 (1.702 m)   Wt 228 lb 6 oz (103.6 kg)   SpO2 95%   BMI 35.77 kg/m    Wt Readings from Last 3 Encounters:  12/23/23 228 lb 6 oz (103.6 kg)  12/15/23 228 lb (103.4 kg)  12/11/23 226 lb 12.8 oz (102.9 kg)    General:  NAD. Neck: No JVD or HJR. Lungs: Dry crackles at the lung bases, left greater than right.  Good air movement. Heart: Regular rate and rhythm with 2/6 holosystolic murmur. Abdomen: Soft, nontender, nondistended. Extremities: No lower extremity edema.  ASSESSMENT AND PLAN: .    Chronic heart failure with improved ejection fraction due to nonischemic cardiomyopathy: Michelle [name] appears euvolemic on exam with stable NYHA  class II-III heart failure symptoms.  I suspect her continued exertional dyspnea and crackles on lung exam are driven primarily by her interstitial lung disease.  Her soft blood pressure noted again today precludes further escalation of her GDMT.  Given stable LVEF of 45-50% on last echo, we will not pursue any medication changes today.  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  She is scheduled for  follow-up with Dr. Isadora next month.  I encouraged her to speak with him about further dose adjustments or alternative therapies.    Dispo: Return to clinic in 1 year.  Signed, Lonni Hanson, MD

## 2023-12-28 ENCOUNTER — Other Ambulatory Visit (HOSPITAL_COMMUNITY): Payer: Self-pay

## 2023-12-30 ENCOUNTER — Telehealth: Payer: Self-pay

## 2023-12-30 ENCOUNTER — Other Ambulatory Visit: Payer: Self-pay

## 2023-12-30 NOTE — Telephone Encounter (Signed)
 Noted

## 2023-12-30 NOTE — Telephone Encounter (Signed)
 noted

## 2023-12-30 NOTE — Progress Notes (Signed)
 Specialty Pharmacy Refill Coordination Note  Michelle [name] is a 83 y.o. female contacted today regarding refills of specialty medication(s) Acalabrutinib  Maleate ([place] )   Patient requested Delivery   Delivery date: 01/01/24   Verified address: [name] [place] [address]   Medication will be filled on 12/31/23.

## 2023-12-30 NOTE — Telephone Encounter (Signed)
 Copied from CRM [id number]. Topic: Clinical - Prescription Issue >> Dec 29, 2023  5:07 PM Deleta RAMAN wrote: Reason for CRM: patient would like to notify FNP arnett that the heartburn medication that was given to her is working well

## 2023-12-31 ENCOUNTER — Other Ambulatory Visit: Payer: Self-pay

## 2024-01-08 DIAGNOSIS — J8410 Pulmonary fibrosis, unspecified: Secondary | ICD-10-CM | POA: Diagnosis not present

## 2024-01-08 DIAGNOSIS — M0510 Rheumatoid lung disease with rheumatoid arthritis of unspecified site: Secondary | ICD-10-CM | POA: Diagnosis not present

## 2024-01-08 DIAGNOSIS — M0579 Rheumatoid arthritis with rheumatoid factor of multiple sites without organ or systems involvement: Secondary | ICD-10-CM | POA: Diagnosis not present

## 2024-01-08 DIAGNOSIS — J849 Interstitial pulmonary disease, unspecified: Secondary | ICD-10-CM | POA: Diagnosis not present

## 2024-01-08 DIAGNOSIS — Z7960 Long term (current) use of unspecified immunomodulators and immunosuppressants: Secondary | ICD-10-CM | POA: Diagnosis not present

## 2024-01-08 DIAGNOSIS — M150 Primary generalized (osteo)arthritis: Secondary | ICD-10-CM | POA: Diagnosis not present

## 2024-01-11 ENCOUNTER — Ambulatory Visit: Admitting: Student in an Organized Health Care Education/Training Program

## 2024-01-11 ENCOUNTER — Encounter: Payer: Self-pay | Admitting: Student in an Organized Health Care Education/Training Program

## 2024-01-11 VITALS — BP 110/60 | HR 81 | Temp 97.1°F | Ht 67.0 in | Wt 225.8 lb

## 2024-01-11 DIAGNOSIS — J849 Interstitial pulmonary disease, unspecified: Secondary | ICD-10-CM | POA: Diagnosis not present

## 2024-01-11 DIAGNOSIS — M059 Rheumatoid arthritis with rheumatoid factor, unspecified: Secondary | ICD-10-CM

## 2024-01-11 DIAGNOSIS — R0602 Shortness of breath: Secondary | ICD-10-CM

## 2024-01-11 DIAGNOSIS — M069 Rheumatoid arthritis, unspecified: Secondary | ICD-10-CM

## 2024-01-11 DIAGNOSIS — Z8572 Personal history of non-Hodgkin lymphomas: Secondary | ICD-10-CM

## 2024-01-11 DIAGNOSIS — J9611 Chronic respiratory failure with hypoxia: Secondary | ICD-10-CM | POA: Diagnosis not present

## 2024-01-11 DIAGNOSIS — I8290 Acute embolism and thrombosis of unspecified vein: Secondary | ICD-10-CM

## 2024-01-11 LAB — PULMONARY FUNCTION TEST
DL/VA % pred: 89 %
DL/VA: 3.59 ml/min/mmHg/L
DLCO unc % pred: 46 %
DLCO unc: 9.56 ml/min/mmHg
FEF 25-75 Post: 1.86 L/s
FEF 25-75 Pre: 0.74 L/s
FEF2575-%Change-Post: 150 %
FEF2575-%Pred-Post: 117 %
FEF2575-%Pred-Pre: 46 %
FEV1-%Change-Post: 23 %
FEV1-%Pred-Post: 58 %
FEV1-%Pred-Pre: 47 %
FEV1-Post: 1.32 L
FEV1-Pre: 1.07 L
FEV1FVC-%Change-Post: 13 %
FEV1FVC-%Pred-Pre: 97 %
FEV6-%Change-Post: 9 %
FEV6-%Pred-Post: 57 %
FEV6-%Pred-Pre: 52 %
FEV6-Post: 1.62 L
FEV6-Pre: 1.49 L
FEV6FVC-%Pred-Post: 105 %
FEV6FVC-%Pred-Pre: 105 %
FVC-%Change-Post: 9 %
FVC-%Pred-Post: 54 %
FVC-%Pred-Pre: 49 %
FVC-Post: 1.62 L
FVC-Pre: 1.49 L
Post FEV1/FVC ratio: 81 %
Post FEV6/FVC ratio: 100 %
Pre FEV1/FVC ratio: 72 %
Pre FEV6/FVC Ratio: 100 %
RV % pred: 57 %
RV: 1.47 L
TLC % pred: 55 %
TLC: 3.05 L

## 2024-01-11 MED ORDER — TRELEGY ELLIPTA 100-62.5-25 MCG/ACT IN AEPB: 1.0000 | INHALATION_SPRAY | Freq: Once | RESPIRATORY_TRACT | 12 refills | Status: AC

## 2024-01-11 NOTE — Progress Notes (Signed)
 Full PFT completed today ? ?

## 2024-01-11 NOTE — Patient Instructions (Signed)
 Full PFT completed today ? ?

## 2024-01-11 NOTE — Progress Notes (Signed)
 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  Her imaging studies all show UIP which I suspect is secondary to RA-ILD.  Her most recent CT scan from March 2025 showed stable fibrosis. She overall feels well and is symptomatically much improved.   She was previously immune suppressed with Rituximab , with persistence of her COVID-19 infection between March and May of 2023. CT imaging at that time was notable for the underlying UIP with additional ground glass opacities suggesting active inflammation. B-Cell depletion with anti-CD-20 monoclonal antibodies has been associated with persistent COVID infections with multiple underlying patterns of lung damage (including organizing pneumonia, diffuse alveolar damage, etc...). She did not have any tissue diagnosis and the underlying pattern is difficult to ascertain.  This has since been discontinued   Her most recent HRCT was noted for a UIP pattern. She has positive RF, anti-CCP, ANA, and anti-Ro antibodies and is followed closely by rheumatology.  PFTs today continue to show restriction with diffusion defect and response to bronchodilator.  She is maintained on triple therapy with Trelegy.  She is currently on hydroxychloroquine  and is being considered for the initiation of mycophenolate.  She was unfortunately unable to tolerate nintedanib even at reduced dose secondary to GI side effects.  I suspect she would have similar side effects with pirfenidone I will hold off initiating it for now.     -continue with O2 via nasal cannula with exertion -continue apixaban  -Discontinue nintedanib given inability to tolerate -Continue DuoNebs every 6 hours as needed -Continue Trelegy 1 puff  once daily -Continue hydroxychloroquine  -Okay to initiate mycophenolate from pulmonary perspective -Will repeat PFTs at the 1 year mark  Return in about 6 months (around 07/13/2024).  I spent 30 minutes caring for this patient today, including preparing to see the patient, obtaining a medical history , reviewing a separately obtained history, performing a medically appropriate examination and/or evaluation, counseling and educating the patient/family/caregiver, ordering medications, tests, or procedures, documenting clinical information in the electronic health record, and independently interpreting results (not separately reported/billed) and communicating results to the patient/family/caregiver  Belva November, MD [facility]  End of visit medications:  Meds ordered this encounter  Medications   Fluticasone -Umeclidin-Vilant (TRELEGY ELLIPTA ) 100-62.5-25 MCG/ACT AEPB    Sig: Inhale 1 puff into the lungs once for 1 dose.    Dispense:  1 each    Refill:  12     Current Outpatient Medications:    acalabrutinib  maleate (CALQUENCE ) 100 MG tablet, Take 1 tablet (100 mg total) by mouth 2 (two) times daily., Disp: 60 tablet, Rfl: 2   acetaminophen  (TYLENOL ) 500 MG tablet, Take 1,000 mg by mouth every 6 (six) hours as needed for mild pain. , Disp: , Rfl:    albuterol  (VENTOLIN  HFA) 108 (90 Base) MCG/ACT inhaler, 2 puffs inhaled orally every 4 to 6 hours as needed, not to exceed 12 puffs in 24 hours, Disp: 18 each, Rfl: 1   carvedilol  (COREG ) 3.125 MG tablet, TAKE 1 TABLET BY MOUTH TWICE A DAY, Disp: 180 tablet, Rfl: 1   feeding supplement (ENSURE ENLIVE / ENSURE PLUS) LIQD, Take 237 mLs by mouth 3 (three) times daily between meals., Disp: 237 mL, Rfl: 12   fluticasone  (FLONASE ) 50 MCG/ACT nasal spray, PLACE 2  SPRAYS INTO BOTH NOSTRILS DAILY AS NEEDED FOR ALLERGIES., Disp: 48 mL, Rfl: 1   Fluticasone -Umeclidin-Vilant (TRELEGY ELLIPTA ) 100-62.5-25 MCG/ACT AEPB, Inhale 1 puff  into the lungs once for 1 dose., Disp: 1 each, Rfl: 12   furosemide  (LASIX ) 40 MG tablet, TAKE 1 TABLET BY MOUTH EVERY DAY, Disp: 90 tablet, Rfl: 2   hydroxychloroquine  (PLAQUENIL ) 200 MG tablet, Take 1 tablet by mouth 2 (two) times daily., Disp: , Rfl:    ipratropium-albuterol  (DUONEB) 0.5-2.5 (3) MG/3ML SOLN, Take 3 mLs by nebulization every 6 (six) hours as needed., Disp: 300 mL, Rfl: 11   KLOR-CON  M10 10 MEQ tablet, TAKE 1 TABLET BY MOUTH EVERY DAY, Disp: 90 tablet, Rfl: 1   losartan  (COZAAR ) 25 MG tablet, TAKE 1/2 TABLET BY MOUTH EVERY DAY, Disp: 45 tablet, Rfl: 0   MEGARED OMEGA-3 KRILL OIL PO, Take 1 tablet by mouth daily., Disp: , Rfl:    Multiple Vitamins-Minerals (CENTRUM SILVER 50+WOMEN) TABS, Take 1 tablet by mouth daily., Disp: , Rfl:    omeprazole  (PRILOSEC) 20 MG capsule, Take 20 mg by mouth daily., Disp: , Rfl:    ondansetron  (ZOFRAN ) 8 MG tablet, ONE PILL EVERY 8 HOURS AS NEEDED FOR NAUSEA/VOMITTING., Disp: 40 tablet, Rfl: 1   OXYGEN , Inhale into the lungs. 2 Liters 24/7, Disp: , Rfl:    rosuvastatin  (CRESTOR ) 5 MG tablet, TAKE 1 TABLET (5 MG TOTAL) BY MOUTH DAILY., Disp: 90 tablet, Rfl: 3 No current facility-administered medications for this visit.  Facility-Administered Medications Ordered in Other Visits:    heparin  lock flush 100 unit/mL, 500 Units, Intravenous, Once, Brahmanday, Govinda R, MD   sodium chloride  flush (NS) 0.9 % injection 10 mL, 10 mL, Intravenous, Once, Brahmanday, Govinda R, MD   sodium chloride  flush (NS) 0.9 % injection 10 mL, 10 mL, Intravenous, Once, Brahmanday, Govinda R, MD   Subjective:   PATIENT ID: Michelle [name] GENDER: female DOB: [birth year], MRN: [id number]  Chief Complaint  Patient presents with   Follow-up    DOE. No wheezing or cough.    HPI  Patient is a pleasant 83 year old female with a history of rheumatoid arthritis and interstitial lung disease who presents for follow-up.  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 She was admitted with acute hypoxic respiratory failure to [facility] in March of 2023. She was found to have COVID with the persistence of viral detection. She was treated with steroids, antibiotics and molnupravir. At that point, she was treated with Rituximab . She was also found to have an acute PE treated with Apixaban . She was discharged home on oxygen  therapy with a prednisone  taper and diuretics. She was re-admitted in May of 2023 again with worsening respiratory failure and diagnosed with multi-focal pneumonia. Patient again treated with antibiotics and steroids. COVID was still positive during said admission in May of 2023.   Imaging during her prior admission was noted underlying fibrotic lung disease (UIP pattern) with super imposed ground glass opacities. She was then seen by Dr. Nuala in clinic and prescribed a prolonged prednisone  taper and repeat HRCT Most recent HRCT from was end of March, 2025 that shows stable UIP pattern of fibrosis. She was unable to get her PFT's done as she found them difficult to finish.  Return visit 10/13/2023:   We initiated Nintedanib following our last visit which she tolerated poorly. She developed severe GI symptoms with diarrhea and N/V and discontinued it  because of that. She was seen by Dr. Tobie from Rheumatology and given the diagnosis of RA she is now started on Hydroxychloroquine . She feels much better from a joint pain side of things. She also feels like she has more energy. Her shortness of breath is at baseline and she uses oxygen  nocturnally and with significant exertion.   Return visit 01/11/2024:  She actually reports feeling much better and overall feels great.  She is walking with a cane and no longer is in a wheelchair.  She has no shortness of breath to speak of nor does she report any cough.  She was unable to tolerate the reduced dose of nintedanib and  discontinued it secondary to GI side effects.  She continues to follow-up closely with rheumatology, currently on hydroxychloroquine  with consideration for the initiation of mycophenolate.  She had her PFTs.   Patient is followed closely by oncology for Mantle cell lymphoma. She follows with Dr. Rennie. Patient is maintained on Hydroxychloroquine . Rituximab  had been discontinued for over a year now. She also follows with Dr. Mady from cardiology regarding NICM (EF as low as 30% in 2021, recovered to 60% in 2023) and is maintained on goal directed medical therapy. She continues on Apixaban  for management of her VTE disease.   Past medical history notable for NICM, HFpEF, OSA on CPAP, RA, GERD, and Stage IV mantle cell lymphoma. She reports being diagnosed with RA by Dr. Maryl many years ago. She is now followed by Dr. Tobie from [facility]  Ancillary information including prior medications, full medical/surgical/family/social histories, and PFTs (when available) are listed below and have been reviewed.   Review of Systems  Constitutional:  Negative for chills, fever, malaise/fatigue and weight loss.  Respiratory:  Negative for cough, hemoptysis, sputum production, shortness of breath and wheezing.   Cardiovascular:  Negative for chest pain and palpitations.     Objective:   Vitals:   01/11/24 0947  BP: 110/60  Pulse: 81  Temp: (!) 97.1 F (36.2 C)  SpO2: 100%  Weight: 225 lb 12.8 oz (102.4 kg)  Height: 5' 7 (1.702 m)   100% on RA  BMI Readings from Last 3 Encounters:  01/11/24 35.37 kg/m  01/11/24 35.37 kg/m  12/23/23 35.77 kg/m   Wt Readings from Last 3 Encounters:  01/11/24 225 lb 12.8 oz (102.4 kg)  01/11/24 225 lb 12.8 oz (102.4 kg)  12/23/23 228 lb 6 oz (103.6 kg)    Physical Exam Constitutional:      Appearance: Normal appearance. She is obese.  Cardiovascular:     Rate and Rhythm: Normal rate and regular rhythm.     Pulses: Normal pulses.     Heart  sounds: Normal heart sounds.  Pulmonary:     Effort: Pulmonary effort is normal.     Breath sounds: Rales present. No wheezing.  Neurological:     General: No focal deficit present.     Mental Status: She is alert and oriented to person, place, and time. Mental status is at baseline.       Ancillary Information    Past Medical History:  Diagnosis Date   Arthritis    Collagen vascular disease (HCC)    GERD (gastroesophageal reflux disease)    Headache(784.0)    HFrEF (heart failure with reduced ejection fraction) (HCC)    Nonischemic cardiomyopathy, LVEF as low as 30-35% in 08/2019   History of methotrexate  therapy    Hyperlipidemia    hx   Lymphadenopathy of head and  neck 01/2015   see on Thyroid  ultrasound   Lymphoma, mantle cell (HCC) 06/01/2015   bx of lymph node in right breast/Stage IV Mantle Cell Lymphoma   Multifocal pneumonia 10/05/2021   Personal history of chemotherapy    Pulmonary embolism (HCC) 10/05/2021   acute   Respiratory failure (HCC) 10/05/2021   acute   Rheumatoid arthritis (HCC)      Family History  Problem Relation Age of Onset   Diabetes Mother    Cholelithiasis Mother    Hypertension Sister    Diabetes Sister    Heart murmur Sister    Arthritis Brother    Breast cancer Neg Hx      Past Surgical History:  Procedure Laterality Date   BREAST BIOPSY Left 11/07/2020   u/s bx-hydromark #3 coil-path pending   CARDIAC CATHETERIZATION  09/2004   [facility]; EF 60%   CARDIAC CATHETERIZATION  08/2004   [facility]   IR FLUORO GUIDED NEEDLE PLC ASPIRATION/INJECTION LOC  06/18/2018   PERIPHERAL VASCULAR CATHETERIZATION N/A 07/04/2015   Procedure: Pat Cath Insertion;  Surgeon: Selinda GORMAN Gu, MD;  Location: [facility] INVASIVE CV LAB;  Service: Cardiovascular;  Laterality: N/A;   PORTA CATH REMOVAL N/A 06/23/2018   Procedure: PORTA CATH REMOVAL;  Surgeon: Gu Selinda GORMAN, MD;  Location: [facility] INVASIVE CV LAB;  Service: Cardiovascular;  Laterality: N/A;   RIGHT/LEFT HEART  CATH AND CORONARY ANGIOGRAPHY Bilateral 09/20/2019   Procedure: RIGHT/LEFT HEART CATH AND CORONARY ANGIOGRAPHY;  Surgeon: Mady Bruckner, MD;  Location: [facility] INVASIVE CV LAB;  Service: Cardiovascular;  Laterality: Bilateral;    Social History   Socioeconomic History   Marital status: Married    Spouse name: Not on file   Number of children: Not on file   Years of education: Not on file   Highest education level: Not on file  Occupational History   Not on file  Tobacco Use   Smoking status: Never   Smokeless tobacco: Never  Vaping Use   Vaping status: Never Used  Substance and Sexual Activity   Alcohol use: No   Drug use: No   Sexual activity: Never  Other Topics Concern   Not on file  Social History Narrative   ** Merged History Encounter **       Lives in [place]. Works as Clinical biochemist.   Social Drivers of Corporate investment banker Strain: Low Risk  (12/07/2023)   Overall Financial Resource Strain (CARDIA)    Difficulty of Paying Living Expenses: Not hard at all  Food Insecurity: No Food Insecurity (12/07/2023)   Hunger Vital Sign    Worried About Running Out of Food in the Last Year: Never true    Ran Out of Food in the Last Year: Never true  Transportation Needs: No Transportation Needs (12/07/2023)   PRAPARE - Administrator, Civil Service (Medical): No    Lack of Transportation (Non-Medical): No  Physical Activity: Inactive (12/07/2023)   Exercise Vital Sign    Days of Exercise per Week: 0 days    Minutes of Exercise per Session: 0 min  Stress: No Stress Concern Present (12/07/2023)   [employer] of Occupational Health - Occupational Stress Questionnaire    Feeling of Stress: Only a little  Social Connections: Moderately Integrated (12/07/2023)   Social Connection and Isolation Panel    Frequency of Communication with Friends and Family: More than three times a week    Frequency of Social Gatherings with Friends and Family: More than three times  a week    Attends Religious Services: More than 4 times per year    Active Member of Clubs or Organizations: Yes    Attends Banker Meetings: More than 4 times per year    Marital Status: Widowed  Intimate Partner Violence: Not At Risk (12/07/2023)   Humiliation, Afraid, Rape, and Kick questionnaire    Fear of Current or Ex-Partner: No    Emotionally Abused: No    Physically Abused: No    Sexually Abused: No     No Known Allergies   CBC    Component Value Date/Time   WBC 7.4 12/11/2023 0952   WBC 10.5 05/22/2023 0932   RBC 4.16 12/11/2023 0952   HGB 12.8 12/11/2023 0952   HGB 13.4 09/08/2019 1359   HCT 39.7 12/11/2023 0952   HCT 40.9 09/08/2019 1359   PLT 179 12/11/2023 0952   PLT 194 09/08/2019 1359   MCV 95.4 12/11/2023 0952   MCV 90 09/08/2019 1359   MCV 89 09/14/2013 1127   MCH 30.8 12/11/2023 0952   MCHC 32.2 12/11/2023 0952   RDW 13.8 12/11/2023 0952   RDW 13.9 09/08/2019 1359   RDW 15.1 (H) 09/14/2013 1127   LYMPHSABS 1.5 12/11/2023 0952   LYMPHSABS 1.3 09/08/2019 1359   MONOABS 0.4 12/11/2023 0952   EOSABS 0.1 12/11/2023 0952   EOSABS 0.1 09/08/2019 1359   BASOSABS 0.0 12/11/2023 0952   BASOSABS 0.0 09/08/2019 1359    Pulmonary Functions Testing Results:    Latest Ref Rng & Units 01/11/2024    8:35 AM 05/20/2023    9:38 AM 11/19/2021    1:33 PM  PFT Results  FVC-Pre L 1.49  0.89  1.24   FVC-Predicted Pre % 49  29  50   FVC-Post L 1.62   1.27   FVC-Predicted Post % 54   52   Pre FEV1/FVC % % 72  100  82   Post FEV1/FCV % % 81   91   FEV1-Pre L 1.07  0.89  1.02   FEV1-Predicted Pre % 47  39  53   FEV1-Post L 1.32   1.15   DLCO uncorrected ml/min/mmHg 9.56     DLCO UNC% % 46     DLVA Predicted % 89     TLC L 3.05   3.08   TLC % Predicted % 55   55   RV % Predicted % 57   61     Outpatient Medications Prior to Visit  Medication Sig Dispense Refill   acalabrutinib  maleate (CALQUENCE ) 100 MG tablet Take 1 tablet (100 mg total) by mouth  2 (two) times daily. 60 tablet 2   acetaminophen  (TYLENOL ) 500 MG tablet Take 1,000 mg by mouth every 6 (six) hours as needed for mild pain.      albuterol  (VENTOLIN  HFA) 108 (90 Base) MCG/ACT inhaler 2 puffs inhaled orally every 4 to 6 hours as needed, not to exceed 12 puffs in 24 hours 18 each 1   carvedilol  (COREG ) 3.125 MG tablet TAKE 1 TABLET BY MOUTH TWICE A DAY 180 tablet 1   feeding supplement (ENSURE ENLIVE / ENSURE PLUS) LIQD Take 237 mLs by mouth 3 (three) times daily between meals. 237 mL 12   fluticasone  (FLONASE ) 50 MCG/ACT nasal spray PLACE 2 SPRAYS INTO BOTH NOSTRILS DAILY AS NEEDED FOR ALLERGIES. 48 mL 1   furosemide  (LASIX ) 40 MG tablet TAKE 1 TABLET BY MOUTH EVERY DAY 90 tablet 2   hydroxychloroquine  (PLAQUENIL ) 200 MG  tablet Take 1 tablet by mouth 2 (two) times daily.     ipratropium-albuterol  (DUONEB) 0.5-2.5 (3) MG/3ML SOLN Take 3 mLs by nebulization every 6 (six) hours as needed. 300 mL 11   KLOR-CON  M10 10 MEQ tablet TAKE 1 TABLET BY MOUTH EVERY DAY 90 tablet 1   losartan  (COZAAR ) 25 MG tablet TAKE 1/2 TABLET BY MOUTH EVERY DAY 45 tablet 0   MEGARED OMEGA-3 KRILL OIL PO Take 1 tablet by mouth daily.     Multiple Vitamins-Minerals (CENTRUM SILVER 50+WOMEN) TABS Take 1 tablet by mouth daily.     omeprazole  (PRILOSEC) 20 MG capsule Take 20 mg by mouth daily.     ondansetron  (ZOFRAN ) 8 MG tablet ONE PILL EVERY 8 HOURS AS NEEDED FOR NAUSEA/VOMITTING. 40 tablet 1   OXYGEN  Inhale into the lungs. 2 Liters 24/7     rosuvastatin  (CRESTOR ) 5 MG tablet TAKE 1 TABLET (5 MG TOTAL) BY MOUTH DAILY. 90 tablet 3   Nintedanib (OFEV ) 100 MG CAPS Take 1 capsule (100 mg total) by mouth 2 (two) times daily. **note dose decrease** (Patient not taking: Reported on 01/11/2024) 180 capsule 1   Facility-Administered Medications Prior to Visit  Medication Dose Route Frequency Provider Last Rate Last Admin   heparin  lock flush 100 unit/mL  500 Units Intravenous Once Brahmanday, Govinda R, MD        sodium chloride  flush (NS) 0.9 % injection 10 mL  10 mL Intravenous Once Brahmanday, Govinda R, MD       sodium chloride  flush (NS) 0.9 % injection 10 mL  10 mL Intravenous Once Brahmanday, Govinda R, MD

## 2024-01-14 ENCOUNTER — Telehealth: Payer: Self-pay

## 2024-01-14 NOTE — Telephone Encounter (Signed)
 Copied from CRM [id number]. Topic: Clinical - Medical Advice >> Jan 14, 2024  3:19 PM [place] A wrote: Reason for CRM: Patient has scratchy throat and was wondering what to do since her sister in law informed her that she tested positive for Covid? Patient said she has no pain,fever or body aches. Please call

## 2024-01-14 NOTE — Telephone Encounter (Signed)
 Spoke to pt she states that she does not need appt in office she will try otc sore throat medication and has been taking Mucinex , but if she develops any other symptoms she will call back and schedule in office visit

## 2024-01-18 ENCOUNTER — Other Ambulatory Visit: Payer: Self-pay | Admitting: Internal Medicine

## 2024-01-18 DIAGNOSIS — E876 Hypokalemia: Secondary | ICD-10-CM

## 2024-01-20 ENCOUNTER — Other Ambulatory Visit: Payer: Self-pay

## 2024-01-22 ENCOUNTER — Other Ambulatory Visit: Payer: Self-pay | Admitting: Internal Medicine

## 2024-01-22 ENCOUNTER — Other Ambulatory Visit: Payer: Self-pay

## 2024-01-22 DIAGNOSIS — G4733 Obstructive sleep apnea (adult) (pediatric): Secondary | ICD-10-CM | POA: Diagnosis not present

## 2024-01-22 DIAGNOSIS — J9601 Acute respiratory failure with hypoxia: Secondary | ICD-10-CM | POA: Diagnosis not present

## 2024-01-22 DIAGNOSIS — C8311 Mantle cell lymphoma, lymph nodes of head, face, and neck: Secondary | ICD-10-CM

## 2024-01-22 DIAGNOSIS — I2699 Other pulmonary embolism without acute cor pulmonale: Secondary | ICD-10-CM | POA: Diagnosis not present

## 2024-01-22 MED ORDER — CALQUENCE 100 MG PO TABS
100.0000 mg | ORAL_TABLET | Freq: Two times a day (BID) | ORAL | 2 refills | Status: DC
  Filled 2024-01-22 – 2024-01-28 (×3): qty 60, 30d supply, fill #0
  Filled 2024-02-16 – 2024-02-29 (×2): qty 60, 30d supply, fill #1
  Filled 2024-03-23: qty 60, 30d supply, fill #2

## 2024-01-26 ENCOUNTER — Other Ambulatory Visit: Payer: Self-pay

## 2024-01-26 ENCOUNTER — Other Ambulatory Visit (HOSPITAL_COMMUNITY): Payer: Self-pay

## 2024-01-28 ENCOUNTER — Other Ambulatory Visit: Payer: Self-pay

## 2024-01-28 ENCOUNTER — Other Ambulatory Visit (HOSPITAL_COMMUNITY): Payer: Self-pay

## 2024-01-28 NOTE — Progress Notes (Signed)
 Specialty Pharmacy Refill Coordination Note  Michelle [name] is a 83 y.o. female contacted today regarding refills of specialty medication(s) Acalabrutinib  Maleate (Calquence )   Patient requested Delivery   Delivery date: 01/29/24   Verified address: [name] [place] [address]   Medication will be filled on 01/28/24.

## 2024-02-12 ENCOUNTER — Encounter: Payer: Self-pay | Admitting: Internal Medicine

## 2024-02-12 ENCOUNTER — Inpatient Hospital Stay: Attending: Internal Medicine

## 2024-02-12 ENCOUNTER — Inpatient Hospital Stay: Admitting: Internal Medicine

## 2024-02-12 VITALS — BP 97/68 | HR 73 | Temp 98.0°F | Resp 18 | Ht 67.0 in | Wt 219.2 lb

## 2024-02-12 DIAGNOSIS — R0602 Shortness of breath: Secondary | ICD-10-CM | POA: Insufficient documentation

## 2024-02-12 DIAGNOSIS — M25512 Pain in left shoulder: Secondary | ICD-10-CM | POA: Diagnosis not present

## 2024-02-12 DIAGNOSIS — Z79899 Other long term (current) drug therapy: Secondary | ICD-10-CM | POA: Insufficient documentation

## 2024-02-12 DIAGNOSIS — C8311 Mantle cell lymphoma, lymph nodes of head, face, and neck: Secondary | ICD-10-CM | POA: Diagnosis not present

## 2024-02-12 DIAGNOSIS — M069 Rheumatoid arthritis, unspecified: Secondary | ICD-10-CM | POA: Diagnosis not present

## 2024-02-12 DIAGNOSIS — D649 Anemia, unspecified: Secondary | ICD-10-CM | POA: Diagnosis not present

## 2024-02-12 DIAGNOSIS — J9610 Chronic respiratory failure, unspecified whether with hypoxia or hypercapnia: Secondary | ICD-10-CM | POA: Insufficient documentation

## 2024-02-12 LAB — CBC WITH DIFFERENTIAL (CANCER CENTER ONLY)
Abs Immature Granulocytes: 0.05 K/uL (ref 0.00–0.07)
Basophils Absolute: 0 K/uL (ref 0.0–0.1)
Basophils Relative: 0 %
Eosinophils Absolute: 0.1 K/uL (ref 0.0–0.5)
Eosinophils Relative: 1 %
HCT: 40.3 % (ref 36.0–46.0)
Hemoglobin: 12.9 g/dL (ref 12.0–15.0)
Immature Granulocytes: 1 %
Lymphocytes Relative: 15 %
Lymphs Abs: 1.3 K/uL (ref 0.7–4.0)
MCH: 30.2 pg (ref 26.0–34.0)
MCHC: 32 g/dL (ref 30.0–36.0)
MCV: 94.4 fL (ref 80.0–100.0)
Monocytes Absolute: 0.6 K/uL (ref 0.1–1.0)
Monocytes Relative: 7 %
Neutro Abs: 6.6 K/uL (ref 1.7–7.7)
Neutrophils Relative %: 76 %
Platelet Count: 215 K/uL (ref 150–400)
RBC: 4.27 MIL/uL (ref 3.87–5.11)
RDW: 14.6 % (ref 11.5–15.5)
WBC Count: 8.7 K/uL (ref 4.0–10.5)
nRBC: 0 % (ref 0.0–0.2)

## 2024-02-12 LAB — CMP (CANCER CENTER ONLY)
ALT: 13 U/L (ref 0–44)
AST: 28 U/L (ref 15–41)
Albumin: 3.8 g/dL (ref 3.5–5.0)
Alkaline Phosphatase: 64 U/L (ref 38–126)
Anion gap: 8 (ref 5–15)
BUN: 19 mg/dL (ref 8–23)
CO2: 26 mmol/L (ref 22–32)
Calcium: 9 mg/dL (ref 8.9–10.3)
Chloride: 100 mmol/L (ref 98–111)
Creatinine: 0.96 mg/dL (ref 0.44–1.00)
GFR, Estimated: 59 mL/min — ABNORMAL LOW (ref >60)
Glucose, Bld: 89 mg/dL (ref 70–99)
Potassium: 3.8 mmol/L (ref 3.5–5.1)
Sodium: 134 mmol/L — ABNORMAL LOW (ref 135–145)
Total Bilirubin: 0.8 mg/dL (ref 0.0–1.2)
Total Protein: 6.7 g/dL (ref 6.5–8.1)

## 2024-02-12 LAB — BRAIN NATRIURETIC PEPTIDE: B Natriuretic Peptide: 33.9 pg/mL (ref 0.0–100.0)

## 2024-02-12 LAB — LACTATE DEHYDROGENASE: LDH: 177 U/L (ref 98–192)

## 2024-02-12 NOTE — Progress Notes (Signed)
 [facility] OFFICE PROGRESS NOTE  Patient Care Team: Dineen Rollene MATSU, FNP as PCP - General (Family Medicine) End, Lonni, MD as PCP - Cardiology (Cardiology) Fernande Elspeth BROCKS, MD as PCP - Electrophysiology (Cardiology) [name], MD (Internal Medicine) Rennie Cindy SAUNDERS, MD as Consulting Physician (Internal Medicine)   Cancer Staging  No matching staging information was found for the patient.    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 Feb 9th- START Benda-Ritux with neulasta ; Prolonged neutropenia; DISCONT- Benda-Ritux;   # April 13 th 2017- START R-CHOP x1; severe/prolonged neutropenia; PET- CR; BMBx-Neg; Disc R-CHOP # June 2022- DIAGNOSIS: thickened cortex of 12 mm. An additional lymph node demonstrates a thickened cortex of 7 mm. A third lymph node demonstrates a mildly thickened cortex of 4 mm A. LYMPH NODE, LEFT AXILLA; ULTRASOUND-GUIDED BIOPSY:  - CD5+ MONOCLONAL B-CELL POPULATION; COMPATIBLE WITH INVOLVEMENT BY THE  PATIENT'S KNOWN MANTLE CELL LYMPHOMA.   #July 2022-second week-started ibrutinib  420 mg a dayx 1 week-stop because of severe rash.  Significant clinical response noted.  # # 26th MAY 2017- Start Rituxan  q 17M Main OCT 12th 2017- PET NED.    # AUG 1st, 2022- start acalbrutinib. 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  Ambulating independently/with cane.   [name] Shed 83 y.o.  female pleasant patient multiple medical problems-ischemic cardiomyopathy; pulm hypertension; Hx of PE OFF eliquis  and above history of recurrent mantle cell lymphoma on Calquence ; here for follow-up.  Patient has chronic shortness of breath not any significantly worse.  She has been following closely with pulmonary.  Started on Trelegy.  No further hospitalizations. . Appetite is good.  Bowels are normal. Denies any fever or night sweats. Patient admits compliance to Calquence .    No chest pain no new lumps or bumps.  Review of Systems  Constitutional:  Negative for chills, diaphoresis, fever, malaise/fatigue and weight loss.  HENT:  Negative for nosebleeds and sore throat.   Eyes:  Negative for double vision.  Respiratory:  Negative for hemoptysis, sputum production and wheezing.   Cardiovascular:  Negative for chest pain, palpitations, orthopnea and leg swelling.  Gastrointestinal:  Positive for constipation. Negative for abdominal pain, blood in stool, diarrhea, heartburn, melena, nausea and vomiting.  Genitourinary:  Negative for dysuria, frequency and urgency.  Musculoskeletal:  Positive for back pain and joint pain.  Skin: Negative.  Negative for itching and rash.  Neurological:  Negative for dizziness, tingling, focal weakness, weakness and headaches.  Endo/Heme/Allergies:  Does not bruise/bleed easily.  Psychiatric/Behavioral:  Negative for depression. The patient is not nervous/anxious and does not have insomnia.     PAST MEDICAL HISTORY :  Past Medical History:  Diagnosis Date   Arthritis    Collagen vascular disease (HCC)    GERD (gastroesophageal  reflux disease)    Headache(784.0)    HFrEF (heart failure with reduced ejection fraction) (HCC)    Nonischemic cardiomyopathy, LVEF as low as 30-35% in  08/2019   History of methotrexate  therapy    Hyperlipidemia    hx   Lymphadenopathy of head and neck 01/2015   see on Thyroid  ultrasound   Lymphoma, mantle cell (HCC) 06/01/2015   bx of lymph node in right breast/Stage IV Mantle Cell Lymphoma   Multifocal pneumonia 10/05/2021   Personal history of chemotherapy    Pulmonary embolism (HCC) 10/05/2021   acute   Respiratory failure (HCC) 10/05/2021   acute   Rheumatoid arthritis (HCC)     PAST SURGICAL HISTORY :   Past Surgical History:  Procedure Laterality Date   BREAST BIOPSY Left 11/07/2020   u/s bx-hydromark #3 coil-path pending   CARDIAC CATHETERIZATION  09/2004   [facility]; EF 60%   CARDIAC CATHETERIZATION  08/2004   [facility]   IR FLUORO GUIDED NEEDLE PLC ASPIRATION/INJECTION LOC  06/18/2018   PERIPHERAL VASCULAR CATHETERIZATION N/A 07/04/2015   Procedure: Pat Cath Insertion;  Surgeon: Selinda GORMAN Gu, MD;  Location: [facility] INVASIVE CV LAB;  Service: Cardiovascular;  Laterality: N/A;   PORTA CATH REMOVAL N/A 06/23/2018   Procedure: PORTA CATH REMOVAL;  Surgeon: Gu Selinda GORMAN, MD;  Location: [facility] INVASIVE CV LAB;  Service: Cardiovascular;  Laterality: N/A;   RIGHT/LEFT HEART CATH AND CORONARY ANGIOGRAPHY Bilateral 09/20/2019   Procedure: RIGHT/LEFT HEART CATH AND CORONARY ANGIOGRAPHY;  Surgeon: Mady Bruckner, MD;  Location: [facility] INVASIVE CV LAB;  Service: Cardiovascular;  Laterality: Bilateral;    FAMILY HISTORY :   Family History  Problem Relation Age of Onset   Diabetes Mother    Cholelithiasis Mother    Hypertension Sister    Diabetes Sister    Heart murmur Sister    Arthritis Brother    Breast cancer Neg Hx     SOCIAL HISTORY:   Social History   Tobacco Use   Smoking status: Never   Smokeless tobacco: Never  Vaping Use   Vaping status: Never Used  Substance Use Topics   Alcohol use: No   Drug use: No    ALLERGIES:  has no known allergies.  MEDICATIONS:  Current Outpatient Medications  Medication Sig Dispense  Refill   acalabrutinib  maleate (CALQUENCE ) 100 MG tablet Take 1 tablet (100 mg total) by mouth 2 (two) times daily. 60 tablet 2   acetaminophen  (TYLENOL ) 500 MG tablet Take 1,000 mg by mouth every 6 (six) hours as needed for mild pain.      albuterol  (VENTOLIN  HFA) 108 (90 Base) MCG/ACT inhaler 2 puffs inhaled orally every 4 to 6 hours as needed, not to exceed 12 puffs in 24 hours 18 each 1   carvedilol  (COREG ) 3.125 MG tablet TAKE 1 TABLET BY MOUTH TWICE A DAY 180 tablet 1   feeding supplement (ENSURE ENLIVE / ENSURE PLUS) LIQD Take 237 mLs by mouth 3 (three) times daily between meals. 237 mL 12   furosemide  (LASIX ) 40 MG tablet TAKE 1 TABLET BY MOUTH EVERY DAY 90 tablet 2   hydroxychloroquine  (PLAQUENIL ) 200 MG tablet Take 1 tablet by mouth 2 (two) times daily.     ipratropium-albuterol  (DUONEB) 0.5-2.5 (3) MG/3ML SOLN Take 3 mLs by nebulization every 6 (six) hours as needed. 300 mL 11   losartan  (COZAAR ) 25 MG tablet TAKE 1/2 TABLET BY MOUTH EVERY DAY 45 tablet 0   MEGARED OMEGA-3 KRILL OIL PO Take 1 tablet by mouth  daily.     Multiple Vitamins-Minerals (CENTRUM SILVER 50+WOMEN) TABS Take 1 tablet by mouth daily.     omeprazole  (PRILOSEC) 20 MG capsule Take 20 mg by mouth daily.     ondansetron  (ZOFRAN ) 8 MG tablet ONE PILL EVERY 8 HOURS AS NEEDED FOR NAUSEA/VOMITTING. 40 tablet 1   OXYGEN  Inhale into the lungs. 2 Liters 24/7     potassium chloride  (KLOR-CON  M) 10 MEQ tablet TAKE 1 TABLET BY MOUTH EVERY DAY 90 tablet 1   rosuvastatin  (CRESTOR ) 5 MG tablet TAKE 1 TABLET (5 MG TOTAL) BY MOUTH DAILY. 90 tablet 3   TRELEGY ELLIPTA  100-62.5-25 MCG/ACT AEPB Inhale 1 puff into the lungs daily.     No current facility-administered medications for this visit.   Facility-Administered Medications Ordered in Other Visits  Medication Dose Route Frequency Provider Last Rate Last Admin   heparin  lock flush 100 unit/mL  500 Units Intravenous Once Rhea Kaelin R, MD       sodium chloride  flush (NS)  0.9 % injection 10 mL  10 mL Intravenous Once Dezman Granda R, MD       sodium chloride  flush (NS) 0.9 % injection 10 mL  10 mL Intravenous Once Valine Drozdowski R, MD        PHYSICAL EXAMINATION: ECOG PERFORMANCE STATUS: 0 - Asymptomatic  BP 97/68 (BP Location: Right Arm, Patient Position: Sitting, Cuff Size: Normal)   Pulse 73   Temp 98 F (36.7 C) (Tympanic)   Resp 18   Ht 5' 7 (1.702 m)   Wt 219 lb 3.2 oz (99.4 kg)   SpO2 100%   BMI 34.33 kg/m   Filed Weights   02/12/24 0944  Weight: 219 lb 3.2 oz (99.4 kg)       Physical Exam HENT:     Head: Normocephalic and atraumatic.     Mouth/Throat:     Pharynx: No oropharyngeal exudate.  Eyes:     Pupils: Pupils are equal, round, and reactive to light.  Cardiovascular:     Rate and Rhythm: Normal rate and regular rhythm.  Pulmonary:     Effort: No respiratory distress.     Breath sounds: No wheezing.     Comments: Bilateral basilar crackles Abdominal:     General: Bowel sounds are normal. There is no distension.     Palpations: Abdomen is soft. There is no mass.     Tenderness: There is no abdominal tenderness. There is no guarding or rebound.  Musculoskeletal:        General: No tenderness. Normal range of motion.     Cervical back: Normal range of motion and neck supple.  Skin:    General: Skin is warm.  Neurological:     Mental Status: She is alert and oriented to person, place, and time.  Psychiatric:        Mood and Affect: Affect normal.    LABORATORY DATA:  I have reviewed the data as listed    Component Value Date/Time   NA 134 (L) 02/12/2024 0948   NA 145 (H) 09/08/2019 1359   NA 142 09/14/2013 1127   K 3.8 02/12/2024 0948   K 3.5 09/14/2013 1127   CL 100 02/12/2024 0948   CL 110 (H) 09/14/2013 1127   CO2 26 02/12/2024 0948   CO2 29 09/14/2013 1127   GLUCOSE 89 02/12/2024 0948   GLUCOSE 106 (H) 09/14/2013 1127   BUN 19 02/12/2024 0948   BUN 15 09/08/2019 1359   BUN 8 09/14/2013 1127  CREATININE 0.96 02/12/2024 0948   CREATININE 0.71 09/14/2013 1127   CREATININE 0.68 04/01/2013 1550   CALCIUM  9.0 02/12/2024 0948   CALCIUM  8.6 09/14/2013 1127   PROT 6.7 02/12/2024 0948   PROT 7.6 09/14/2013 1127   ALBUMIN 3.8 02/12/2024 0948   ALBUMIN 3.2 (L) 09/14/2013 1127   AST 28 02/12/2024 0948   ALT 13 02/12/2024 0948   ALT 19 09/14/2013 1127   ALKPHOS 64 02/12/2024 0948   ALKPHOS 76 09/14/2013 1127   BILITOT 0.8 02/12/2024 0948   GFRNONAA 59 (L) 02/12/2024 0948   GFRNONAA >60 09/14/2013 1127   GFRAA >60 01/20/2020 1303   GFRAA >60 09/14/2013 1127    No results found for: SPEP, UPEP  Lab Results  Component Value Date   WBC 8.7 02/12/2024   NEUTROABS 6.6 02/12/2024   HGB 12.9 02/12/2024   HCT 40.3 02/12/2024   MCV 94.4 02/12/2024   PLT 215 02/12/2024      Chemistry      Component Value Date/Time   NA 134 (L) 02/12/2024 0948   NA 145 (H) 09/08/2019 1359   NA 142 09/14/2013 1127   K 3.8 02/12/2024 0948   K 3.5 09/14/2013 1127   CL 100 02/12/2024 0948   CL 110 (H) 09/14/2013 1127   CO2 26 02/12/2024 0948   CO2 29 09/14/2013 1127   BUN 19 02/12/2024 0948   BUN 15 09/08/2019 1359   BUN 8 09/14/2013 1127   CREATININE 0.96 02/12/2024 0948   CREATININE 0.71 09/14/2013 1127   CREATININE 0.68 04/01/2013 1550      Component Value Date/Time   CALCIUM  9.0 02/12/2024 0948   CALCIUM  8.6 09/14/2013 1127   ALKPHOS 64 02/12/2024 0948   ALKPHOS 76 09/14/2013 1127   AST 28 02/12/2024 0948   ALT 13 02/12/2024 0948   ALT 19 09/14/2013 1127   BILITOT 0.8 02/12/2024 0948       RADIOGRAPHIC STUDIES: I have personally reviewed the radiological images as listed and agreed with the findings in the report. No results found.   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     # Currently on Calequence 100 mg twice a day [ AUG 1st, 2022]-  STABLE-recommend informing us  prior to any dental procedures/extractions.  Calquence  can increase the risk of bleeding.  #  Mild anemia-  continue gentle iron.  # Immunocompromised state: Hx Multifocal pneumonia-[April 2023]-Given the multiple pneumonias/high risk of death-recommended continuation of further rituximab . MARCH 2024- IgG- 800.  Monitor for now. Stable.  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   #Chronic respiratory failure -2 L home O2 -NICMP/pulmonary hypertension/chronic bilateral lung scarring-CT MARCH 2025- HRCT-  Pulmonary parenchymal pattern of fibrosis [Dr.Dgyali]-Tolerating Nintenaib poorly- currently OFF-  PFTs- AUg 2025- onted- defer to PCP.   # Hx of Rheumatoid arthritis-given ILD-  s/p evaluation with  Rheumatology- stable.   # Left shoulder pain/ Chronic arthritis-on meloxicam  as needed-stable.   #Vaccinations :recommend  flu shot-   COVID booster/RSV; ? Pneumonia vaccination [check with PCP]  # DISPOSITION:  # follow up-MD in 3 months -/friday  MD ;  labs- cbc/cmp/ldh/BNP; immunoglobulins-;- -Dr.B     Orders Placed This Encounter  Procedures   CMP (Cancer Center only)    Standing Status:   Future    Expected Date:   05/13/2024    Expiration Date:   08/11/2024   CBC with Differential (Cancer Center Only)    Standing Status:   Future    Expected Date:   05/13/2024    Expiration Date:   08/11/2024   Lactate dehydrogenase    Standing Status:   Future    Expected Date:   05/13/2024    Expiration Date:   08/11/2024   Brain natriuretic peptide    Standing Status:   Future    Expected Date:   05/13/2024    Expiration Date:   08/11/2024   Immunoglobulins,  QN, A/E/G/M    Standing Status:   Future    Expected Date:   05/13/2024    Expiration Date:   08/11/2024     All questions were answered. The patient knows to call the clinic with any problems, questions or concerns.      Cindy JONELLE Joe, MD 02/12/2024 11:22 AM

## 2024-02-12 NOTE — Assessment & Plan Note (Addendum)
#  2016-2017- Mantle cell lymphoma recurrent biopsy-proven [July 2022]; Currently on Calquence  [ DISCONTINUED  Rituximab -given the multifocal pneumonia]-  PET scan JUNE 23rd, 2025- No findings for recurrent lymphoma.     # Currently on Calequence 100 mg twice a day [ AUG 1st, 2022]-  STABLE-recommend informing us  prior to any dental procedures/extractions.  Calquence  can increase the risk of bleeding.  #  Mild anemia-  continue gentle iron.  # Immunocompromised state: Hx Multifocal pneumonia-[April 2023]-Given the multiple pneumonias/high risk of death-recommended continuation of further rituximab . MARCH 2024- IgG- 800.  Monitor for now. Stable.  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   #Chronic respiratory failure -2 L home O2 -NICMP/pulmonary hypertension/chronic bilateral lung scarring-CT MARCH 2025- HRCT-  Pulmonary parenchymal pattern of fibrosis [Dr.Dgyali]-Tolerating Nintenaib poorly- currently OFF-  PFTs- AUg 2025- onted- defer to PCP.   # Hx of Rheumatoid arthritis-given ILD-  s/p evaluation with  Rheumatology- stable.   # Left shoulder pain/ Chronic arthritis-on meloxicam  as needed-stable.   #Vaccinations :recommend  flu shot-   COVID booster/RSV; ? Pneumonia vaccination [check with PCP]  # DISPOSITION:  # follow up-MD in 3 months -/friday  MD ;labs- cbc/cmp/ldh/BNP; immunoglobulins-;- -Dr.B

## 2024-02-12 NOTE — Progress Notes (Signed)
 PFT with Dr. Isadora 01/11/24. C/o of energy level has decreased.

## 2024-02-16 ENCOUNTER — Other Ambulatory Visit: Payer: Self-pay

## 2024-02-18 ENCOUNTER — Other Ambulatory Visit: Payer: Self-pay

## 2024-02-22 DIAGNOSIS — I2699 Other pulmonary embolism without acute cor pulmonale: Secondary | ICD-10-CM | POA: Diagnosis not present

## 2024-02-22 DIAGNOSIS — J9601 Acute respiratory failure with hypoxia: Secondary | ICD-10-CM | POA: Diagnosis not present

## 2024-02-29 ENCOUNTER — Other Ambulatory Visit: Payer: Self-pay | Admitting: Pharmacy Technician

## 2024-02-29 ENCOUNTER — Other Ambulatory Visit: Payer: Self-pay

## 2024-02-29 ENCOUNTER — Other Ambulatory Visit (HOSPITAL_COMMUNITY): Payer: Self-pay

## 2024-02-29 NOTE — Progress Notes (Signed)
 Specialty Pharmacy Refill Coordination Note  Michelle [name] is a 83 y.o. female contacted today regarding refills of specialty medication(s) Acalabrutinib  Maleate (Calquence )   Patient requested Delivery   Delivery date: 03/02/24   Verified address: [name]   [place] [address]   Medication will be filled on 03/01/24.  Called patient back for clinical assessment & vm not set up.

## 2024-03-01 ENCOUNTER — Other Ambulatory Visit: Payer: Self-pay

## 2024-03-04 ENCOUNTER — Encounter: Payer: Self-pay | Admitting: Family

## 2024-03-04 ENCOUNTER — Ambulatory Visit (INDEPENDENT_AMBULATORY_CARE_PROVIDER_SITE_OTHER): Admitting: Family

## 2024-03-04 VITALS — BP 110/70 | HR 78 | Temp 98.1°F | Ht 67.0 in | Wt 220.0 lb

## 2024-03-04 DIAGNOSIS — Z0000 Encounter for general adult medical examination without abnormal findings: Secondary | ICD-10-CM

## 2024-03-04 DIAGNOSIS — R1013 Epigastric pain: Secondary | ICD-10-CM | POA: Diagnosis not present

## 2024-03-04 DIAGNOSIS — K219 Gastro-esophageal reflux disease without esophagitis: Secondary | ICD-10-CM

## 2024-03-04 DIAGNOSIS — Z23 Encounter for immunization: Secondary | ICD-10-CM | POA: Diagnosis not present

## 2024-03-04 MED ORDER — PANTOPRAZOLE SODIUM 20 MG PO TBEC: 40.0000 mg | DELAYED_RELEASE_TABLET | ORAL | 1 refills | Status: DC

## 2024-03-04 NOTE — Assessment & Plan Note (Signed)
 Presentation c/w GERD. Stop omeprazole  20mg . Start protonix 40mg  x 2 weeks and then decrease to 20mg . Counseled on food triggers. If symptom persists, discussed need for imaging to evaluate for possible cholelithiasis.

## 2024-03-04 NOTE — Patient Instructions (Addendum)
 STOP omeprazole   Start protonix 40mg    After 2 weeks, decrease protonix 20mg  daily to see if symptoms are managed.  If pain doesn't resolve with protonix, please let me know as discussed.    GERD in Adults: Diet Changes When you have gastroesophageal reflux disease (GERD), you may need to make changes to your diet. Choosing the right foods can help with your symptoms. Think about working with an expert in healthy eating called a dietitian. They can help you make healthy food choices. What are tips for following this plan? Reading food labels Look for foods that are low in saturated fat. Foods that may help with your symptoms include: Foods with less than 5% of daily value (DV) of fat. Foods with 0 grams of trans fat. Cooking [employer] in ways that don't use a lot of fat. These ways include: Baking. Steaming. Grilling. Broiling. To add flavor, try to use herbs that are low in spice and acidity. Avoid frying your food. Meal planning  Eat small meals often rather than eating 3 large meals each day. Eat your meals slowly in a place where you feel relaxed. If told by your health care provider, avoid: Foods that cause symptoms. Keep a food diary to keep track of foods that cause symptoms. Alcohol. Drinking a lot of liquid with meals. General instructions For 2-3 hours after you eat, avoid: Bending over. Exercise. Lying down. Chew sugar-free gum after meals. What foods should I eat? Eat a healthy diet. Try to include: Foods with high amounts of fiber. These include: Fruits and vegetables. Whole grains and beans. Low-fat dairy products. Lean meats, fish, and poultry. Egg whites. Foods that cause symptoms in someone else may not cause symptoms for you. Work with your provider to find foods that are safe for you. The items listed above may not be all the foods and drinks you can have. Talk with a dietitian to learn more. The items listed above may not be a complete list of  foods and beverages you can eat and drink. Contact a dietitian for more information. What foods should I avoid? Limiting some of these foods may help with your symptoms. Each person is different. Talk with a dietitian or your provider to help you find the exact foods to avoid. Some of the foods to avoid may include: Fruits Fruits with a lot of acid in them. These may include citrus fruits, such as oranges, grapefruit, pineapple, and lemons. Vegetables Deep-fried vegetables, such as [place] fries. Vegetables, sauces, or toppings made with added fat and vegetables with acid in them. These may include tomatoes and tomato products, chili peppers, onions, garlic, and horseradish. Grains Pastries or quick breads with added fat. Meats and other proteins High-fat meats, such as fatty beef or pork, hot dogs, ribs, ham, sausage, salami, and bacon. Fried meat or protein, such as fried fish and fried chicken. Egg yolks. Fats and oils Butter. Margarine. Shortening. Ghee. Drinks Coffee and other drinks with caffeine in them. Fizzy and sugary drinks, such as soda and energy drinks. Fruit juice made with acidic fruits, such as orange or grapefruit. Tomato juice. Sweets and desserts Chocolate and cocoa. Donuts. Seasonings and condiments Mint, such as peppermint and spearmint. Condiments, herbs, or seasonings that cause symptoms. These may include curry, hot sauce, or vinegar-based salad dressings. The items listed above may not be all the foods and drinks you should avoid. Talk with a dietitian to learn more. Questions to ask your health care provider Changes to your  diet and everyday life are often the first steps taken to manage symptoms of GERD. If these changes don't help, talk with your provider about taking medicines. Where to find more information [employer] Gastrointestinal Disorders: aboutgerd.org This information is not intended to replace advice given to you by your health care  provider. Make sure you discuss any questions you have with your health care provider. Document Revised: 03/31/2023 Document Reviewed: 10/15/2022 Elsevier Patient Education  2024 Elsevier Inc.  Health Maintenance for Postmenopausal Women Menopause is a normal process in which your ability to get pregnant comes to an end. This process happens slowly over many months or years, usually between the ages of 69 and 47. Menopause is complete when you have missed your menstrual period for 12 months. It is important to talk with your health care provider about some of the most common conditions that affect women after menopause (postmenopausal women). These include heart disease, cancer, and bone loss (osteoporosis). Adopting a healthy lifestyle and getting preventive care can help to promote your health and wellness. The actions you take can also lower your chances of developing some of these common conditions. What are the signs and symptoms of menopause? During menopause, you may have the following symptoms: Hot flashes. These can be moderate or severe. Night sweats. Decrease in sex drive. Mood swings. Headaches. Tiredness (fatigue). Irritability. Memory problems. Problems falling asleep or staying asleep. Talk with your health care provider about treatment options for your symptoms. Do I need hormone replacement therapy? Hormone replacement therapy is effective in treating symptoms that are caused by menopause, such as hot flashes and night sweats. Hormone replacement carries certain risks, especially as you become older. If you are thinking about using estrogen or estrogen with progestin, discuss the benefits and risks with your health care provider. How can I reduce my risk for heart disease and stroke? The risk of heart disease, heart attack, and stroke increases as you age. One of the causes may be a change in the body's hormones during menopause. This can affect how your body uses dietary  fats, triglycerides, and cholesterol. Heart attack and stroke are medical emergencies. There are many things that you can do to help prevent heart disease and stroke. Watch your blood pressure High blood pressure causes heart disease and increases the risk of stroke. This is more likely to develop in people who have high blood pressure readings or are overweight. Have your blood pressure checked: Every 3-5 years if you are 34-20 years of age. Every year if you are 61 years old or older. Eat a healthy diet  Eat a diet that includes plenty of vegetables, fruits, low-fat dairy products, and lean protein. Do not eat a lot of foods that are high in solid fats, added sugars, or sodium. Get regular exercise Get regular exercise. This is one of the most important things you can do for your health. Most adults should: Try to exercise for at least 150 minutes each week. The exercise should increase your heart rate and make you sweat (moderate-intensity exercise). Try to do strengthening exercises at least twice each week. Do these in addition to the moderate-intensity exercise. Spend less time sitting. Even light physical activity can be beneficial. Other tips Work with your health care provider to achieve or maintain a healthy weight. Do not use any products that contain nicotine or tobacco. These products include cigarettes, chewing tobacco, and vaping devices, such as e-cigarettes. If you need help quitting, ask your health care  provider. Know your numbers. Ask your health care provider to check your cholesterol and your blood sugar (glucose). Continue to have your blood tested as directed by your health care provider. Do I need screening for cancer? Depending on your health history and family history, you may need to have cancer screenings at different stages of your life. This may include screening for: Breast cancer. Cervical cancer. Lung cancer. Colorectal cancer. What is my risk for  osteoporosis? After menopause, you may be at increased risk for osteoporosis. Osteoporosis is a condition in which bone destruction happens more quickly than new bone creation. To help prevent osteoporosis or the bone fractures that can happen because of osteoporosis, you may take the following actions: If you are 69-33 years old, get at least 1,000 mg of calcium  and at least 600 international units (IU) of vitamin D  per day. If you are older than age 49 but younger than age 65, get at least 1,200 mg of calcium  and at least 600 international units (IU) of vitamin D  per day. If you are older than age 17, get at least 1,200 mg of calcium  and at least 800 international units (IU) of vitamin D  per day. Smoking and drinking excessive alcohol increase the risk of osteoporosis. Eat foods that are rich in calcium  and vitamin D , and do weight-bearing exercises several times each week as directed by your health care provider. How does menopause affect my mental health? Depression may occur at any age, but it is more common as you become older. Common symptoms of depression include: Feeling depressed. Changes in sleep patterns. Changes in appetite or eating patterns. Feeling an overall lack of motivation or enjoyment of activities that you previously enjoyed. Frequent crying spells. Talk with your health care provider if you think that you are experiencing any of these symptoms. General instructions See your health care provider for regular wellness exams and vaccines. This may include: Scheduling regular health, dental, and eye exams. Getting and maintaining your vaccines. These include: Influenza vaccine. Get this vaccine each year before the flu season begins. Pneumonia vaccine. Shingles vaccine. Tetanus, diphtheria, and pertussis (Tdap) booster vaccine. Your health care provider may also recommend other immunizations. Tell your health care provider if you have ever been abused or do not feel safe at  home. Summary Menopause is a normal process in which your ability to get pregnant comes to an end. This condition causes hot flashes, night sweats, decreased interest in sex, mood swings, headaches, or lack of sleep. Treatment for this condition may include hormone replacement therapy. Take actions to keep yourself healthy, including exercising regularly, eating a healthy diet, watching your weight, and checking your blood pressure and blood sugar levels. Get screened for cancer and depression. Make sure that you are up to date with all your vaccines. This information is not intended to replace advice given to you by your health care provider. Make sure you discuss any questions you have with your health care provider. Document Revised: 10/08/2020 Document Reviewed: 10/08/2020 Elsevier Patient Education  2024 [employer].

## 2024-03-04 NOTE — Progress Notes (Signed)
 Assessment & Plan:  Annual physical exam Assessment & Plan: She declines CBE today. She is no longer screening for breast cancer due to her preference. She declines mammogram. No longer screening for colon cancer. She declines DEXA today and will consider next year.    Epigastric pain -     Pantoprazole Sodium; Take 2 tablets (40 mg total) by mouth every morning. Take 30 minutes to hour before breakfast  Dispense: 30 tablet; Refill: 1  Gastroesophageal reflux disease, unspecified whether esophagitis present Assessment & Plan: Presentation c/w GERD. Stop omeprazole  20mg . Start protonix 40mg  x 2 weeks and then decrease to 20mg . Counseled on food triggers. If symptom persists, discussed need for imaging to evaluate for possible cholelithiasis.     Other orders -     Flu vaccine HIGH DOSE PF(Fluzone Trivalent)     Return precautions given.   Risks, benefits, and alternatives of the medications and treatment plan prescribed today were discussed, and patient expressed understanding.   Education regarding symptom management and diagnosis given to patient on AVS either electronically or printed.  No follow-ups on file.  [name], FNP  Subjective:    Patient ID: Michelle [name], female    DOB: June 24, 1940, 83 y.o.   MRN: [id number]  CC: Michelle [name] is a 83 y.o. female who presents today for physical exam.    HPI: Accompanied by sister Complains of epigastric burning x one month after eating 'anything'.   She has been taking tylenol  with relief.  Associated nausea.  No sob, cp, cough, weight loss, ruq pain, fever, choking, constipation.   Compliant omeprazole  20mg  every day.  No nsaids. No alcohol use.  SOB has improved with Trelegy.   Colorectal Cancer Screening: reported in 2012. Breast Cancer Screening: Mammogram overdue , last 02/13/22 Cervical Cancer Screening: no longer screening for cervical cancer.  Bone Health screening/DEXA for 65+: normal DEXA  10/31/20  Lung Cancer Screening: Doesn't have 20 year pack year history and age > 61 years yo 64 years        Tetanus - UTD        Pneumococcal - Completed Exercise: Gets regular exercise.   Alcohol use:  none Smoking/tobacco use: Nonsmoker.    Health Maintenance  Topic Date Due   Flu Shot  01/01/2024   COVID-19 Vaccine (8 - Pfizer risk 2024-25 season) 02/01/2024   Medicare Annual Wellness Visit  12/06/2024   DTaP/Tdap/Td vaccine (3 - Td or Tdap) 02/05/2033   Pneumococcal Vaccine for age over 85  Completed   DEXA scan (bone density measurement)  Completed   Zoster (Shingles) Vaccine  Completed   HPV Vaccine  Aged Out   Meningitis B Vaccine  Aged Out   Colon Cancer Screening  Discontinued   Hepatitis C Screening  Discontinued    ALLERGIES: Patient has no known allergies.  Current Outpatient Medications on File Prior to Visit  Medication Sig Dispense Refill   acalabrutinib  maleate (CALQUENCE ) 100 MG tablet Take 1 tablet (100 mg total) by mouth 2 (two) times daily. 60 tablet 2   acetaminophen  (TYLENOL ) 500 MG tablet Take 1,000 mg by mouth every 6 (six) hours as needed for mild pain.      albuterol  (VENTOLIN  HFA) 108 (90 Base) MCG/ACT inhaler 2 puffs inhaled orally every 4 to 6 hours as needed, not to exceed 12 puffs in 24 hours 18 each 1   carvedilol  (COREG ) 3.125 MG tablet TAKE 1 TABLET BY MOUTH TWICE A DAY 180 tablet 1  feeding supplement (ENSURE ENLIVE / ENSURE PLUS) LIQD Take 237 mLs by mouth 3 (three) times daily between meals. 237 mL 12   furosemide  (LASIX ) 40 MG tablet TAKE 1 TABLET BY MOUTH EVERY DAY 90 tablet 2   hydroxychloroquine  (PLAQUENIL ) 200 MG tablet Take 1 tablet by mouth 2 (two) times daily.     ipratropium-albuterol  (DUONEB) 0.5-2.5 (3) MG/3ML SOLN Take 3 mLs by nebulization every 6 (six) hours as needed. 300 mL 11   losartan  (COZAAR ) 25 MG tablet TAKE 1/2 TABLET BY MOUTH EVERY DAY 45 tablet 0   MEGARED OMEGA-3 KRILL OIL PO Take 1 tablet by mouth daily.      Multiple Vitamins-Minerals (CENTRUM SILVER 50+WOMEN) TABS Take 1 tablet by mouth daily.     ondansetron  (ZOFRAN ) 8 MG tablet ONE PILL EVERY 8 HOURS AS NEEDED FOR NAUSEA/VOMITTING. 40 tablet 1   OXYGEN  Inhale into the lungs. 2 Liters 24/7     potassium chloride  (KLOR-CON  M) 10 MEQ tablet TAKE 1 TABLET BY MOUTH EVERY DAY 90 tablet 1   rosuvastatin  (CRESTOR ) 5 MG tablet TAKE 1 TABLET (5 MG TOTAL) BY MOUTH DAILY. 90 tablet 3   TRELEGY ELLIPTA  100-62.5-25 MCG/ACT AEPB Inhale 1 puff into the lungs daily.     Current Facility-Administered Medications on File Prior to Visit  Medication Dose Route Frequency Provider Last Rate Last Admin   heparin  lock flush 100 unit/mL  500 Units Intravenous Once Brahmanday, Govinda R, MD       sodium chloride  flush (NS) 0.9 % injection 10 mL  10 mL Intravenous Once Brahmanday, Govinda R, MD       sodium chloride  flush (NS) 0.9 % injection 10 mL  10 mL Intravenous Once Brahmanday, Govinda R, MD        Review of Systems  Constitutional:  Negative for chills and fever.  Respiratory:  Negative for cough.   Cardiovascular:  Negative for chest pain and palpitations.  Gastrointestinal:  Positive for abdominal pain. Negative for abdominal distention, constipation, diarrhea, nausea and vomiting.      Objective:    BP 110/70   Pulse 78   Temp 98.1 F (36.7 C) (Oral)   Ht 5' 7 (1.702 m)   Wt 220 lb (99.8 kg)   SpO2 97%   BMI 34.46 kg/m   BP Readings from Last 3 Encounters:  03/04/24 110/70  02/12/24 97/68  01/11/24 110/60   Wt Readings from Last 3 Encounters:  03/04/24 220 lb (99.8 kg)  02/12/24 219 lb 3.2 oz (99.4 kg)  01/11/24 225 lb 12.8 oz (102.4 kg)    Physical Exam Vitals reviewed.  Constitutional:      Appearance: Normal appearance. She is well-developed.  Eyes:     Conjunctiva/sclera: Conjunctivae normal.  Neck:     Thyroid : No thyroid  mass or thyromegaly.  Cardiovascular:     Rate and Rhythm: Normal rate and regular rhythm.     Pulses:  Normal pulses.     Heart sounds: Normal heart sounds.  Pulmonary:     Effort: Pulmonary effort is normal.     Breath sounds: Normal breath sounds. No wheezing, rhonchi or rales.  Abdominal:     General: Bowel sounds are normal. There is no distension.     Palpations: Abdomen is soft. Abdomen is not rigid. There is no fluid wave or mass.     Tenderness: There is no abdominal tenderness. There is no guarding or rebound.  Lymphadenopathy:     Head:     Right side  of head: No submental, submandibular, tonsillar, preauricular, posterior auricular or occipital adenopathy.     Left side of head: No submental, submandibular, tonsillar, preauricular, posterior auricular or occipital adenopathy.     Cervical: No cervical adenopathy.  Skin:    General: Skin is warm and dry.  Neurological:     Mental Status: She is alert.  Psychiatric:        Speech: Speech normal.        Behavior: Behavior normal.        Thought Content: Thought content normal.

## 2024-03-04 NOTE — Assessment & Plan Note (Signed)
 She declines CBE today. She is no longer screening for breast cancer due to her preference. She declines mammogram. No longer screening for colon cancer. She declines DEXA today and will consider next year.

## 2024-03-10 ENCOUNTER — Other Ambulatory Visit: Payer: Self-pay | Admitting: Internal Medicine

## 2024-03-11 ENCOUNTER — Other Ambulatory Visit: Payer: Self-pay | Admitting: Family

## 2024-03-11 DIAGNOSIS — R1013 Epigastric pain: Secondary | ICD-10-CM

## 2024-03-23 ENCOUNTER — Other Ambulatory Visit: Payer: Self-pay

## 2024-03-23 DIAGNOSIS — J9601 Acute respiratory failure with hypoxia: Secondary | ICD-10-CM | POA: Diagnosis not present

## 2024-03-23 DIAGNOSIS — I2699 Other pulmonary embolism without acute cor pulmonale: Secondary | ICD-10-CM | POA: Diagnosis not present

## 2024-03-23 DIAGNOSIS — G4733 Obstructive sleep apnea (adult) (pediatric): Secondary | ICD-10-CM | POA: Diagnosis not present

## 2024-03-23 NOTE — Progress Notes (Signed)
 Specialty Pharmacy Refill Coordination Note  Michelle [name] is a 83 y.o. female contacted today regarding refills of specialty medication(s) Acalabrutinib  Maleate (Calquence )   Patient requested Delivery   Delivery date: 03/28/24   Verified address: [name]   [place] [address]   Medication will be filled on 03/25/24.

## 2024-03-23 NOTE — Progress Notes (Signed)
 Specialty Pharmacy Ongoing Clinical Assessment Note  Michelle [name] is a 83 y.o. female who is being followed by the specialty pharmacy service for RxSp Oncology   Patient's specialty medication(s) reviewed today: Acalabrutinib  Maleate (Calquence )   Missed doses in the last 4 weeks: 0   Patient/Caregiver did not have any additional questions or concerns.   Therapeutic benefit summary: Patient is achieving benefit   Adverse events/side effects summary: No adverse events/side effects   Patient's therapy is appropriate to: Continue    Goals Addressed             This Visit's Progress    Slow Disease Progression   On track    Patient is on track. Patient will maintain adherence. 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         Follow up: 6 months  [facility]

## 2024-03-25 ENCOUNTER — Other Ambulatory Visit: Payer: Self-pay

## 2024-04-01 ENCOUNTER — Encounter: Payer: Self-pay | Admitting: Internal Medicine

## 2024-04-07 ENCOUNTER — Ambulatory Visit: Payer: Self-pay | Admitting: *Deleted

## 2024-04-07 NOTE — Telephone Encounter (Signed)
 FYI Only or Action Required?: Action required by provider: request for appointment, update on patient condition, and requesting earlier appt .  Patient was last seen in primary care on 03/04/2024 by Dineen Rollene MATSU, FNP.  Called Nurse Triage reporting Abdominal Pain.  Symptoms began several weeks ago.  Interventions attempted: OTC medications: maalox.  Symptoms are: gradually worsening.  Triage Disposition: See Physician Within 24 Hours  Patient/caregiver understands and will follow disposition?: Yes   Please advise if earlier appt can be scheduled before 04/13/24. Recommended if sx worsen go to UC/ED.             Copied from CRM #[id number]. Topic: Clinical - Red Word Triage >> Apr 07, 2024  2:44 PM Deaijah H wrote: Red Word that prompted transfer to Nurse Triage:Feeling bad; stomach feeling like she has problems with ingestion and issues with bowel movements. No appetite. Getting worse. Reason for Disposition  [1] MODERATE pain (e.g., interferes with normal activities) AND [2] comes and goes (cramps) AND [3] present > 24 hours  (Exception: Pain with Vomiting or Diarrhea - see that Guideline.)  Answer Assessment - Initial Assessment Questions No available appt until 04/13/24. Recommended if sx worsen go to UC/ED. Patient placed on wait list. Patient reports she is not tolerating food. Can tolerate liquids.        1. LOCATION: Where does it hurt?      Upper abdomen  2. RADIATION: Does the pain shoot anywhere else? (e.g., chest, back)     Shoots in back  3. ONSET: When did the pain begin? (e.g., minutes, hours or days ago)      3 weeks ago  4. SUDDEN: Gradual or sudden onset?     Gradual  5. PATTERN Does the pain come and go, or is it constant?     Comes and goes  6. SEVERITY: How bad is the pain?  (e.g., Scale 1-10; mild, moderate, or severe)     Sharp mild  pain severe at times.  7. RECURRENT SYMPTOM: Have you ever had this type of stomach pain  before? If Yes, ask: When was the last time? and What happened that time?      Yes stayed hot at upper abdomen but not now . 8. AGGRAVATING FACTORS: Does anything seem to cause this pain? (e.g., foods, stress, alcohol)     Eating makes worse  9. CARDIAC SYMPTOMS: Do you have any of the following symptoms: chest pain, difficulty breathing, sweating, nausea?     Nausea  10. OTHER SYMPTOMS: Do you have any other symptoms? (e.g., back pain, diarrhea, fever, urination pain, vomiting)       Upper abdominal pain shoots to back. Nausea, constipation LBM 2 days ago .  11. PREGNANCY: Is there any chance you are pregnant? When was your last menstrual period?       na  Protocols used: Abdominal Pain - Upper-A-AH

## 2024-04-08 ENCOUNTER — Encounter: Payer: Self-pay | Admitting: Internal Medicine

## 2024-04-08 ENCOUNTER — Emergency Department
Admission: EM | Admit: 2024-04-08 | Discharge: 2024-04-08 | Disposition: A | Discharge status: Home / Self Care | Attending: Emergency Medicine | Admitting: Emergency Medicine

## 2024-04-08 ENCOUNTER — Emergency Department

## 2024-04-08 ENCOUNTER — Other Ambulatory Visit: Payer: Self-pay

## 2024-04-08 DIAGNOSIS — K8689 Other specified diseases of pancreas: Secondary | ICD-10-CM | POA: Insufficient documentation

## 2024-04-08 DIAGNOSIS — R1013 Epigastric pain: Secondary | ICD-10-CM

## 2024-04-08 LAB — COMPREHENSIVE METABOLIC PANEL WITH GFR
ALT: 17 U/L (ref 0–44)
AST: 21 U/L (ref 15–41)
Albumin: 3.7 g/dL (ref 3.5–5.0)
Alkaline Phosphatase: 64 U/L (ref 38–126)
Anion gap: 14 (ref 5–15)
BUN: 13 mg/dL (ref 8–23)
CO2: 27 mmol/L (ref 22–32)
Calcium: 9.5 mg/dL (ref 8.9–10.3)
Chloride: 96 mmol/L — ABNORMAL LOW (ref 98–111)
Creatinine, Ser: 1.08 mg/dL — ABNORMAL HIGH (ref 0.44–1.00)
GFR, Estimated: 52 mL/min — ABNORMAL LOW (ref >60)
Glucose, Bld: 116 mg/dL — ABNORMAL HIGH (ref 70–99)
Potassium: 4.4 mmol/L (ref 3.5–5.1)
Sodium: 137 mmol/L (ref 135–145)
Total Bilirubin: 0.8 mg/dL (ref 0.0–1.2)
Total Protein: 7.5 g/dL (ref 6.5–8.1)

## 2024-04-08 LAB — CBC
HCT: 41.8 % (ref 36.0–46.0)
Hemoglobin: 13.7 g/dL (ref 12.0–15.0)
MCH: 30.9 pg (ref 26.0–34.0)
MCHC: 32.8 g/dL (ref 30.0–36.0)
MCV: 94.4 fL (ref 80.0–100.0)
Platelets: 214 K/uL (ref 150–400)
RBC: 4.43 MIL/uL (ref 3.87–5.11)
RDW: 13.5 % (ref 11.5–15.5)
WBC: 9.3 K/uL (ref 4.0–10.5)
nRBC: 0 % (ref 0.0–0.2)

## 2024-04-08 LAB — LIPASE, BLOOD: Lipase: 42 U/L (ref 11–51)

## 2024-04-08 LAB — TROPONIN I (HIGH SENSITIVITY): Troponin I (High Sensitivity): 7 ng/L (ref <18)

## 2024-04-08 MED ORDER — HYDROCODONE-ACETAMINOPHEN 5-325 MG PO TABS: 1.0000 | ORAL_TABLET | Freq: Four times a day (QID) | ORAL | 0 refills | Status: AC | PRN

## 2024-04-08 MED ORDER — MORPHINE SULFATE (PF) 2 MG/ML IV SOLN
2.0000 mg | Freq: Once | INTRAVENOUS | Status: AC
  Administered 2024-04-08: 2 mg via INTRAVENOUS
  Filled 2024-04-08: qty 1

## 2024-04-08 MED ORDER — IOHEXOL 350 MG/ML SOLN
100.0000 mL | Freq: Once | INTRAVENOUS | Status: AC | PRN
  Administered 2024-04-08: 100 mL via INTRAVENOUS

## 2024-04-08 MED ORDER — HYDROCODONE-ACETAMINOPHEN 5-325 MG PO TABS: 1.0000 | ORAL_TABLET | Freq: Once | ORAL | Status: DC

## 2024-04-08 MED ORDER — SODIUM CHLORIDE 0.9 % IV BOLUS
500.0000 mL | Freq: Once | INTRAVENOUS | Status: AC
  Administered 2024-04-08: 500 mL via INTRAVENOUS

## 2024-04-08 MED ORDER — ONDANSETRON HCL 4 MG PO TABS: 4.0000 mg | ORAL_TABLET | Freq: Four times a day (QID) | ORAL | 0 refills | Status: AC | PRN

## 2024-04-08 MED ORDER — ONDANSETRON HCL 4 MG/2ML IJ SOLN
4.0000 mg | Freq: Once | INTRAMUSCULAR | Status: AC
Start: 2024-04-08 — End: 2024-04-08
  Administered 2024-04-08: 4 mg via INTRAVENOUS
  Filled 2024-04-08: qty 2

## 2024-04-08 MED ORDER — SODIUM CHLORIDE 0.9 % IV BOLUS
1000.0000 mL | Freq: Once | INTRAVENOUS | Status: AC
  Administered 2024-04-08: 1000 mL via INTRAVENOUS

## 2024-04-08 NOTE — Discharge Instructions (Signed)
 You are seen in the emergency department today.  Donnell pain.  Your CT scan did show an area of your pancreas that was concerning.  This does need further evaluation.  Please contact Dr. Damaris office to arrange close follow-up.  I sent a prescription for nausea medicine as well as pain medicine to your pharmacy that you can take as needed.  Your blood pressures were on the lower side here, please hold taking your losartan  for now and discuss this further with your outpatient team.  Return to the ER for any new or worsening symptoms.

## 2024-04-08 NOTE — ED Triage Notes (Addendum)
 Pt to ED for generalized but mostly upper abdominal pain that radiates to back with nausea and bloating since 1 month and also intermittent dizziness since 1 month.  BP 84/64 (MAP 73), took twice. Pt is ambulatory. Skin is dry, respirations unlabored.

## 2024-04-08 NOTE — ED Notes (Signed)
 Pt discharged to home, instructions and medications reviewed.  Pt verbalized understanding, no questions at this time.

## 2024-04-08 NOTE — Telephone Encounter (Signed)
 Spoke to pt scheduled her for the 12th of Nov pt states that she feels better than she did but if she feels bad again before herappt she will go to ED

## 2024-04-08 NOTE — ED Provider Notes (Signed)
 [facility] Provider Note    Event Date/Time   First MD Initiated Contact with Patient 04/08/24 [phone]     (approximate)   History   Abdominal Pain and Dizziness   HPI  Michelle [name] is a 83 year old female with history of mantle cell lymphoma in remission presenting to the emergency department for evaluation of epigastric pain.  Patient reports that for the past many months she has had worsening epigastric pain.  Saw her primary care doctor on 10/3 and was placed on Protonix for possible GERD, plan to consider imaging for cholelithiasis as symptoms not improved.  Since that time, patient is continue to have upper abdominal pain and back pain.  Reports poor p.o. intake with nausea and bloating.  Denies shortness of breath.    Physical Exam   Triage Vital Signs: ED Triage Vitals  Encounter Vitals Group     BP 04/08/24 1548 (!) 84/64     Girls Systolic BP Percentile --      Girls Diastolic BP Percentile --      Boys Systolic BP Percentile --      Boys Diastolic BP Percentile --      Pulse Rate 04/08/24 1545 74     Resp 04/08/24 1545 16     Temp 04/08/24 1545 98.4 F (36.9 C)     Temp Source 04/08/24 1545 Oral     SpO2 04/08/24 1545 96 %     Weight 04/08/24 1547 200 lb (90.7 kg)     Height 04/08/24 1547 5' 7 (1.702 m)     Head Circumference --      Peak Flow --      Pain Score 04/08/24 1546 3     Pain Loc --      Pain Education --      Exclude from Growth Chart --     Most recent vital signs: Vitals:   04/08/24 1818 04/08/24 1930  BP: 95/62 (!) 103/58  Pulse: 65 62  Resp: (!) 22 17  Temp:    SpO2: 100% 100%     General: Awake, interactive  CV:  Good peripheral perfusion Resp:  Unlabored respirations, lungs clear to auscultation Abd:  Nondistended, soft, tender to palpation in the epigastric region without rebound or guarding Neuro:  Symmetric facial movement, fluid speech   ED Results / Procedures / Treatments   Labs (all labs  ordered are listed, but only abnormal results are displayed) Labs Reviewed  COMPREHENSIVE METABOLIC PANEL WITH GFR - Abnormal; Notable for the following components:      Result Value   Chloride 96 (*)    Glucose, Bld 116 (*)    Creatinine, Ser 1.08 (*)    GFR, Estimated 52 (*)    All other components within normal limits  LIPASE, BLOOD  CBC  TROPONIN I (HIGH SENSITIVITY)     EKG EKG independently reviewed and interpreted by myself demonstrates:  EKG demonstrates normal sinus rhythm at a rate of 70, PR 142, QRS 132, QTc 451, left bundle branch block noted without appreciable superimposed ischemic changes  RADIOLOGY Imaging independently reviewed and interpreted by myself demonstrates:  CTA of the chest without evidence of PE CT abdomen pelvis demonstrates a hypodensity in the pancreas with differential including pancreatitis versus malignancy, gallstones without evidence of cholecystitis also noted  Formal Radiology Read:  CT Angio Chest PE W and/or Wo Contrast Result Date: 04/08/2024 EXAM: CTA CHEST PE WITH AND WITHOUT AND WITH CONTRAST CT ABDOMEN AND PELVIS  WITH AND WITHOUT AND WITH CONTRAST 04/08/2024 04:47:05 PM TECHNIQUE: CTA of the chest was performed after the administration of intravenous contrast. Multiplanar reformatted images are provided for review. MIP images are provided for review. CT of the abdomen and pelvis was performed with the administration of intravenous contrast. Automated exposure control, iterative reconstruction, and/or weight based adjustment of the mA/kV was utilized to reduce the radiation dose to as low as reasonably achievable. COMPARISON: PET CT dated 11/25/2023. CLINICAL HISTORY: Pulmonary embolism (PE) suspected, high prob. Additional clinical history of lymphoma. FINDINGS: CHEST: PULMONARY ARTERIES: Pulmonary arteries are adequately opacified for evaluation. No intraluminal filling defect to suggest pulmonary embolism. Main pulmonary artery is enlarged.  MEDIASTINUM: No mediastinal lymphadenopathy. The heart and pericardium demonstrate no acute abnormality. There are sclerotic calcifications of the aorta. LUNGS AND PLEURA: Scattered peripheral fibrotic changes have been seen in both lungs more prominent in the lung bases, similar to prior. There are ground-glass opacities in the bilateral inferior lower lobes. No focal consolidation or pulmonary edema. No pleural effusion or pneumothorax. SOFT TISSUES AND BONES: Degenerative changes of the thoracic spine. No acute soft tissue abnormality. ABDOMEN AND PELVIS: LIVER: The liver is unremarkable. GALLBLADDER AND BILE DUCTS: Gallstones versus gallbladder sludge are again seen. No biliary ductal dilatation. SPLEEN: Spleen demonstrates no acute abnormality. PANCREAS: There is a focal hypodensity in the uncinate process of the pancreas measuring 11 mm, which is new compared to prior. No pancreatic ductal dilatation. There is trace inflammatory stranding surrounding the head of the pancreas and uncinate process. ADRENAL GLANDS: Adrenal glands demonstrate no acute abnormality. KIDNEYS, URETERS AND BLADDER: No stones in the kidneys or ureters. No hydronephrosis. No perinephric or periureteral stranding. Urinary bladder is unremarkable. GI AND BOWEL: Stomach and duodenal sweep demonstrate no acute abnormality. There is no bowel obstruction. No abnormal bowel wall thickening or distension. The appendix is not visualized. REPRODUCTIVE: Fibroids are present in the uterus measuring up to 4.3 cm. Ovaries are within normal limits. PERITONEUM AND RETROPERITONEUM: No ascites or free air. There is a small fat-containing umbilical hernia. LYMPH NODES: No lymphadenopathy. BONES AND SOFT TISSUES: Small multilevel degenerative changes of the thoracic spine. No focal soft tissue abnormality. IMPRESSION: 1. No evidence of pulmonary embolism. 2. New 11 mm focal hypodensity in the pancreatic uncinate process with trace surrounding inflammatory  stranding ; recommend correlation with lipase/amylase and consider dedicated pancreatic protocol MRI or CT for further characterization and to assess for focal pancreatitis versus neoplasm. 3. Enlarged pulmonary artery; correlate clinically for pulmonary hypertension. 4. Scattered peripheral fibrotic changes in both lungs, more prominent at the bases, similar to prior. 5. Gallstones versus gallbladder sludge. 6. 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 Electronically signed by: Greig Pique MD 04/08/2024 05:08 PM EST RP Workstation: [id number]   CT ABDOMEN PELVIS W CONTRAST Result Date: 04/08/2024 EXAM: CTA CHEST PE WITH AND WITHOUT AND WITH CONTRAST CT ABDOMEN AND PELVIS WITH AND WITHOUT AND WITH CONTRAST 04/08/2024 04:47:05 PM TECHNIQUE: CTA of the chest was performed after the administration of intravenous contrast. Multiplanar reformatted images are provided for review. MIP images are provided for review. CT of the abdomen and pelvis was performed with the administration of intravenous contrast. Automated exposure control, iterative reconstruction, and/or weight based adjustment of the mA/kV was utilized to reduce the radiation dose to as low as reasonably achievable. COMPARISON: PET CT dated 11/25/2023. CLINICAL HISTORY: Pulmonary embolism (PE) suspected, high prob. Additional clinical history of lymphoma. FINDINGS: CHEST: PULMONARY ARTERIES: Pulmonary arteries are adequately opacified for evaluation.  No intraluminal filling defect to suggest pulmonary embolism. Main pulmonary artery is enlarged. MEDIASTINUM: No mediastinal lymphadenopathy. The heart and pericardium demonstrate no acute abnormality. There are sclerotic calcifications of the aorta. LUNGS AND PLEURA: Scattered peripheral fibrotic changes have been seen in both lungs more prominent in the lung bases, similar to prior. There are ground-glass opacities in the bilateral inferior lower lobes. No focal consolidation or pulmonary edema. No pleural  effusion or pneumothorax. SOFT TISSUES AND BONES: Degenerative changes of the thoracic spine. No acute soft tissue abnormality. ABDOMEN AND PELVIS: LIVER: The liver is unremarkable. GALLBLADDER AND BILE DUCTS: Gallstones versus gallbladder sludge are again seen. No biliary ductal dilatation. SPLEEN: Spleen demonstrates no acute abnormality. PANCREAS: There is a focal hypodensity in the uncinate process of the pancreas measuring 11 mm, which is new compared to prior. No pancreatic ductal dilatation. There is trace inflammatory stranding surrounding the head of the pancreas and uncinate process. ADRENAL GLANDS: Adrenal glands demonstrate no acute abnormality. KIDNEYS, URETERS AND BLADDER: No stones in the kidneys or ureters. No hydronephrosis. No perinephric or periureteral stranding. Urinary bladder is unremarkable. GI AND BOWEL: Stomach and duodenal sweep demonstrate no acute abnormality. There is no bowel obstruction. No abnormal bowel wall thickening or distension. The appendix is not visualized. REPRODUCTIVE: Fibroids are present in the uterus measuring up to 4.3 cm. Ovaries are within normal limits. PERITONEUM AND RETROPERITONEUM: No ascites or free air. There is a small fat-containing umbilical hernia. LYMPH NODES: No lymphadenopathy. BONES AND SOFT TISSUES: Small multilevel degenerative changes of the thoracic spine. No focal soft tissue abnormality. IMPRESSION: 1. No evidence of pulmonary embolism. 2. New 11 mm focal hypodensity in the pancreatic uncinate process with trace surrounding inflammatory stranding ; recommend correlation with lipase/amylase and consider dedicated pancreatic protocol MRI or CT for further characterization and to assess for focal pancreatitis versus neoplasm. 3. Enlarged pulmonary artery; correlate clinically for pulmonary hypertension. 4. Scattered peripheral fibrotic changes in both lungs, more prominent at the bases, similar to prior. 5. Gallstones versus gallbladder sludge. 6.  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 Electronically signed by: Greig Pique MD 04/08/2024 05:08 PM EST RP Workstation: [id number]   DG Chest Portable 1 View Result Date: 04/08/2024 EXAM: 1 VIEW(S) XRAY OF THE CHEST 04/08/2024 04:34:00 PM COMPARISON: 04/27/2023 CLINICAL HISTORY: shortness of breath FINDINGS: LUNGS AND PLEURA: Low lung volumes. Chronic coarsening of the pulmonary interstitium without overt pulmonary edema. No focal pulmonary opacity. No pleural effusion. No pneumothorax. HEART AND MEDIASTINUM: Mild cardiomegaly. BONES AND SOFT TISSUES: Multilevel degenerative changes of the thoracic spine. 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 IMPRESSION: 1. No acute cardiopulmonary abnormality. Electronically signed by: Rogelia Myers MD 04/08/2024 04:59 PM EST RP Workstation: [id number]    PROCEDURES:  Critical Care performed: No  Procedures   MEDICATIONS ORDERED IN ED: Medications  HYDROcodone -acetaminophen  (NORCO/VICODIN) 5-325 MG per tablet 1 tablet (has no administration in time range)  sodium chloride  0.9 % bolus 1,000 mL (0 mLs Intravenous Stopped 04/08/24 1858)  iohexol  (OMNIPAQUE ) 350 MG/ML injection 100 mL (100 mLs Intravenous Contrast Given 04/08/24 1641)  morphine (PF) 2 MG/ML injection 2 mg (2 mg Intravenous Given 04/08/24 1732)  ondansetron  (ZOFRAN ) injection 4 mg (4 mg Intravenous Given 04/08/24 1732)  sodium chloride  0.9 % bolus 500 mL (500 mLs Intravenous New Bag/Given 04/08/24 1935)     IMPRESSION / MDM / ASSESSMENT AND PLAN / ED COURSE  I reviewed  the triage vital signs and the nursing notes.  Differential diagnosis includes, but is not limited to, pancreatitis, colitis, biliary pathology, electrolyte abnormality, recurrent malignancy  Patient's presentation is most consistent with acute presentation with potential threat to life or bodily  function.  83 year old female presenting with 1 months of epigastric pain with poor p.o. intake.  Stable vitals on presentation.  Labs with reassuring CBC, CMP with mild AKI.  Normal lipase.  Negative troponin.  Borderline blood pressure here.  Appears clinically volume overloaded.  Ordered for IV fluids with some improvement.  X-Analisia Kingsford without pneumonia.  With her epigastric pain and history of lymphoma, will obtain CT of the chest as well as CT abdomen pelvis to further evaluate.  Imaging demonstrated a concerning area of her pancreas.  Normal lipase.  Pancreatitis considered but with clinical time course of a month seems less likely.  Will discuss with oncology.  Clinical Course as of 04/08/24 2101  Fri Apr 08, 2024  [id number] Case reviewed with Dr. Rennie who is familiar with the patient as he is her primary oncologist for her lymphoma which is currently in remission.  He reviewed the patient's imaging, agrees findings are concerning for malignancy, less likely pancreatitis with clinical history.  However as the patient is stable, does feel that she can be stably discharged and he will follow-up with her as an outpatient for further workup. [NR]    Clinical Course User Index [NR] Levander Slate, MD     FINAL CLINICAL IMPRESSION(S) / ED DIAGNOSES   Final diagnoses:  Epigastric pain  Pancreatic mass     Rx / DC Orders   ED Discharge Orders          Ordered    HYDROcodone -acetaminophen  (NORCO/VICODIN) 5-325 MG tablet  Every 6 hours PRN        04/08/24 2101    ondansetron  (ZOFRAN ) 4 MG tablet  Every 6 hours PRN        04/08/24 2101             Note:  This document was prepared using Dragon voice recognition software and may include unintentional dictation errors.   Levander Slate, MD 04/08/24 2101

## 2024-04-11 ENCOUNTER — Telehealth: Payer: Self-pay

## 2024-04-11 NOTE — Telephone Encounter (Signed)
 Heron, Dr. B would like pt to be scheduled for ED f/u with his first availability or APP (no labs) if his schedule is to far out.

## 2024-04-11 NOTE — Telephone Encounter (Signed)
 noted

## 2024-04-11 NOTE — Telephone Encounter (Signed)
 Patient called requesting plan for f/u with Dr. B after ED visit on 04/08/24.

## 2024-04-12 ENCOUNTER — Inpatient Hospital Stay: Attending: Internal Medicine | Admitting: Nurse Practitioner

## 2024-04-12 ENCOUNTER — Ambulatory Visit
Admission: RE | Admit: 2024-04-12 | Discharge: 2024-04-12 | Disposition: A | Discharge status: Home / Self Care | Source: Ambulatory Visit | Attending: Nurse Practitioner | Admitting: Nurse Practitioner

## 2024-04-12 ENCOUNTER — Inpatient Hospital Stay

## 2024-04-12 ENCOUNTER — Other Ambulatory Visit: Payer: Self-pay | Admitting: *Deleted

## 2024-04-12 ENCOUNTER — Ambulatory Visit: Admitting: Family

## 2024-04-12 VITALS — BP 107/69 | HR 72 | Temp 98.7°F | Resp 28 | Wt 209.7 lb

## 2024-04-12 DIAGNOSIS — I272 Pulmonary hypertension, unspecified: Secondary | ICD-10-CM | POA: Insufficient documentation

## 2024-04-12 DIAGNOSIS — D649 Anemia, unspecified: Secondary | ICD-10-CM | POA: Insufficient documentation

## 2024-04-12 DIAGNOSIS — Z9221 Personal history of antineoplastic chemotherapy: Secondary | ICD-10-CM | POA: Insufficient documentation

## 2024-04-12 DIAGNOSIS — K8689 Other specified diseases of pancreas: Secondary | ICD-10-CM

## 2024-04-12 DIAGNOSIS — Z79899 Other long term (current) drug therapy: Secondary | ICD-10-CM | POA: Insufficient documentation

## 2024-04-12 DIAGNOSIS — Z86711 Personal history of pulmonary embolism: Secondary | ICD-10-CM | POA: Insufficient documentation

## 2024-04-12 DIAGNOSIS — C8311 Mantle cell lymphoma, lymph nodes of head, face, and neck: Secondary | ICD-10-CM | POA: Insufficient documentation

## 2024-04-12 DIAGNOSIS — I255 Ischemic cardiomyopathy: Secondary | ICD-10-CM | POA: Insufficient documentation

## 2024-04-12 DIAGNOSIS — K862 Cyst of pancreas: Secondary | ICD-10-CM | POA: Insufficient documentation

## 2024-04-12 DIAGNOSIS — M069 Rheumatoid arthritis, unspecified: Secondary | ICD-10-CM | POA: Insufficient documentation

## 2024-04-12 DIAGNOSIS — J9610 Chronic respiratory failure, unspecified whether with hypoxia or hypercapnia: Secondary | ICD-10-CM | POA: Insufficient documentation

## 2024-04-12 LAB — CBC WITH DIFFERENTIAL/PLATELET
Abs Immature Granulocytes: 0.06 K/uL (ref 0.00–0.07)
Basophils Absolute: 0 K/uL (ref 0.0–0.1)
Basophils Relative: 0 %
Eosinophils Absolute: 0.1 K/uL (ref 0.0–0.5)
Eosinophils Relative: 1 %
HCT: 42.1 % (ref 36.0–46.0)
Hemoglobin: 13.9 g/dL (ref 12.0–15.0)
Immature Granulocytes: 1 %
Lymphocytes Relative: 15 %
Lymphs Abs: 1.3 K/uL (ref 0.7–4.0)
MCH: 31 pg (ref 26.0–34.0)
MCHC: 33 g/dL (ref 30.0–36.0)
MCV: 94 fL (ref 80.0–100.0)
Monocytes Absolute: 0.4 K/uL (ref 0.1–1.0)
Monocytes Relative: 5 %
Neutro Abs: 6.8 K/uL (ref 1.7–7.7)
Neutrophils Relative %: 78 %
Platelets: 221 K/uL (ref 150–400)
RBC: 4.48 MIL/uL (ref 3.87–5.11)
RDW: 13.7 % (ref 11.5–15.5)
WBC: 8.6 K/uL (ref 4.0–10.5)
nRBC: 0 % (ref 0.0–0.2)

## 2024-04-12 LAB — COMPREHENSIVE METABOLIC PANEL WITH GFR
ALT: 12 U/L (ref 0–44)
AST: 21 U/L (ref 15–41)
Albumin: 3.9 g/dL (ref 3.5–5.0)
Alkaline Phosphatase: 59 U/L (ref 38–126)
Anion gap: 11 (ref 5–15)
BUN: 19 mg/dL (ref 8–23)
CO2: 26 mmol/L (ref 22–32)
Calcium: 9.4 mg/dL (ref 8.9–10.3)
Chloride: 96 mmol/L — ABNORMAL LOW (ref 98–111)
Creatinine, Ser: 1.06 mg/dL — ABNORMAL HIGH (ref 0.44–1.00)
GFR, Estimated: 53 mL/min — ABNORMAL LOW (ref >60)
Glucose, Bld: 100 mg/dL — ABNORMAL HIGH (ref 70–99)
Potassium: 4.4 mmol/L (ref 3.5–5.1)
Sodium: 133 mmol/L — ABNORMAL LOW (ref 135–145)
Total Bilirubin: 0.8 mg/dL (ref 0.0–1.2)
Total Protein: 7.2 g/dL (ref 6.5–8.1)

## 2024-04-12 LAB — LIPASE, BLOOD: Lipase: 36 U/L (ref 11–51)

## 2024-04-12 LAB — AMYLASE: Amylase: 47 U/L (ref 28–100)

## 2024-04-12 LAB — LACTATE DEHYDROGENASE: LDH: 167 U/L (ref 105–235)

## 2024-04-12 MED ORDER — GADOBUTROL 1 MMOL/ML IV SOLN
9.0000 mL | Freq: Once | INTRAVENOUS | Status: AC | PRN
  Administered 2024-04-12: 9 mL via INTRAVENOUS

## 2024-04-12 NOTE — Progress Notes (Signed)
 [facility] OFFICE PROGRESS NOTE  Patient Care Team: Dineen Rollene MATSU, FNP as PCP - General (Family Medicine) End, Lonni, MD as PCP - Cardiology (Cardiology) Fernande Elspeth BROCKS, MD (Inactive) as PCP - Electrophysiology (Cardiology) [name], MD (Internal Medicine) Rennie Cindy SAUNDERS, MD as Consulting Physician (Internal Medicine)   Cancer Staging  No matching staging information was found for the patient.  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 Feb 9th- START Benda-Ritux with neulasta ; Prolonged neutropenia; DISCONT- Benda-Ritux;   # April 13 th 2017- START R-CHOP x1; severe/prolonged neutropenia; PET- CR; BMBx-Neg; Disc R-CHOP # June 2022- DIAGNOSIS: thickened cortex of 12 mm. An additional lymph node demonstrates a thickened cortex of 7 mm. A third lymph node demonstrates a mildly thickened cortex of 4 mm A. LYMPH NODE, LEFT AXILLA; ULTRASOUND-GUIDED BIOPSY:  - CD5+ MONOCLONAL B-CELL POPULATION; COMPATIBLE WITH INVOLVEMENT BY THE  PATIENT'S KNOWN MANTLE CELL LYMPHOMA.   #July 2022-second week-started ibrutinib  420 mg a dayx 1 week-stop because of severe rash.  Significant clinical response noted.  # # 26th MAY 2017- Start Rituxan  q 45M Main OCT 12th 2017- PET NED.    # AUG 1st, 2022- start acalbrutinib. 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  Ambulating independently/with cane.   Michelle [name] 83 y.o.  female pleasant patient multiple medical problems including history of ischemic cardiomyopathy, pulmonary hypertension, history of PE, off Eliquis , and above history of recurrent mantle cell lymphoma on Calquence , who returns to clinic for follow-up.  In the interim, she was seen in the [facility] ER on 04/08/2024.  Presented for abdominal pain and dizziness with poor intake, nausea, bloating.  Localized to upper abdomen and back.  Had failed trial of pantoprazole for possible GERD.  CT abdomen pelvis was performed which showed a focal hypodensity in the pancreas measuring 11 mm.  CT angio chest was negative for PE, also appreciated pancreatic mass.  Previously, patient had imaging in June 2025, PET scan, that did not recognize any abnormal hypermetabolic activity or appreciate this mass.  Review of Systems  Constitutional:  Negative for chills, diaphoresis, fever, malaise/fatigue and weight loss.  HENT:  Negative for nosebleeds and sore throat.   Eyes:  Negative for double vision.  Respiratory:  Negative for hemoptysis, sputum production and wheezing.   Cardiovascular:  Negative for chest pain, palpitations, orthopnea and leg swelling.  Gastrointestinal:  Positive for constipation. Negative for abdominal pain, blood in stool, diarrhea, heartburn, melena, nausea and vomiting.  Genitourinary:  Negative for dysuria, frequency and urgency.  Musculoskeletal:  Positive for back pain and joint pain.  Skin: Negative.  Negative for itching and rash.  Neurological:  Negative for dizziness, tingling, focal weakness, weakness and headaches.  Endo/Heme/Allergies:  Does not bruise/bleed easily.  Psychiatric/Behavioral:  Negative for  depression. The patient is not nervous/anxious and does not have insomnia.    PAST MEDICAL HISTORY :   Past Medical History:  Diagnosis Date   Arthritis    Collagen vascular disease    GERD (gastroesophageal reflux disease)    Headache(784.0)    HFrEF (heart failure with reduced ejection fraction) (HCC)    Nonischemic cardiomyopathy, LVEF as low as 30-35% in 08/2019   History of methotrexate  therapy    Hyperlipidemia    hx   Lymphadenopathy of head and neck 01/2015   see on Thyroid  ultrasound   Lymphoma, mantle cell (HCC) 06/01/2015   bx of lymph node in right breast/Stage IV Mantle Cell Lymphoma   Multifocal pneumonia 10/05/2021   Personal history of chemotherapy    Pulmonary embolism (HCC) 10/05/2021   acute   Respiratory failure (HCC) 10/05/2021   acute   Rheumatoid arthritis (HCC)    PAST SURGICAL HISTORY :   Past Surgical History:  Procedure Laterality Date   BREAST BIOPSY Left 11/07/2020   u/s bx-hydromark #3 coil-path pending   CARDIAC CATHETERIZATION  09/2004   [facility]; EF 60%   CARDIAC CATHETERIZATION  08/2004   [facility]   IR FLUORO GUIDED NEEDLE PLC ASPIRATION/INJECTION LOC  06/18/2018   PERIPHERAL VASCULAR CATHETERIZATION N/A 07/04/2015   Procedure: Pat Cath Insertion;  Surgeon: Selinda GORMAN Gu, MD;  Location: [facility] INVASIVE CV LAB;  Service: Cardiovascular;  Laterality: N/A;   PORTA CATH REMOVAL N/A 06/23/2018   Procedure: PORTA CATH REMOVAL;  Surgeon: Gu Selinda GORMAN, MD;  Location: [facility] INVASIVE CV LAB;  Service: Cardiovascular;  Laterality: N/A;   RIGHT/LEFT HEART CATH AND CORONARY ANGIOGRAPHY Bilateral 09/20/2019   Procedure: RIGHT/LEFT HEART CATH AND CORONARY ANGIOGRAPHY;  Surgeon: Mady Bruckner, MD;  Location: [facility] INVASIVE CV LAB;  Service: Cardiovascular;  Laterality: Bilateral;   FAMILY HISTORY :   Family History  Problem Relation Age of Onset   Diabetes Mother    Cholelithiasis Mother    Hypertension Sister    Diabetes Sister    Heart murmur Sister    Arthritis Brother    Breast cancer Neg Hx    SOCIAL HISTORY:   Social History   Tobacco Use   Smoking  status: Never   Smokeless tobacco: Never  Vaping Use   Vaping status: Never Used  Substance Use Topics   Alcohol use: No   Drug use: No   ALLERGIES:  has no known allergies.  MEDICATIONS:  Current Outpatient Medications  Medication Sig Dispense Refill   acalabrutinib  maleate (CALQUENCE ) 100 MG tablet Take 1 tablet (100 mg total) by mouth 2 (two) times daily. 60 tablet 2   acetaminophen  (TYLENOL ) 500 MG tablet Take 1,000 mg by mouth every 6 (six) hours as needed for mild pain.      feeding supplement (ENSURE ENLIVE / ENSURE PLUS) LIQD Take 237 mLs by mouth 3 (three) times daily between meals. 237 mL 12   furosemide  (LASIX ) 40 MG tablet TAKE 1 TABLET BY MOUTH EVERY DAY 90 tablet 2   HYDROcodone -acetaminophen  (NORCO/VICODIN) 5-325 MG tablet Take 1 tablet by mouth every 6 (six) hours as needed for up to 5 days. 12 tablet 0   hydroxychloroquine  (PLAQUENIL ) 200 MG tablet Take 1 tablet by mouth 2 (two) times daily.     ipratropium-albuterol  (DUONEB) 0.5-2.5 (3) MG/3ML SOLN Take 3 mLs by nebulization every 6 (six) hours as needed. 300 mL 11   losartan  (COZAAR ) 25 MG tablet TAKE 1/2 TABLET BY MOUTH EVERY DAY 45 tablet 0  ondansetron  (ZOFRAN ) 4 MG tablet Take 1 tablet (4 mg total) by mouth every 6 (six) hours as needed for up to 7 days for nausea or vomiting. 20 tablet 0   OXYGEN  Inhale into the lungs. 2 Liters 24/7     potassium chloride  (KLOR-CON  M) 10 MEQ tablet TAKE 1 TABLET BY MOUTH EVERY DAY 90 tablet 1   rosuvastatin  (CRESTOR ) 5 MG tablet TAKE 1 TABLET (5 MG TOTAL) BY MOUTH DAILY. 90 tablet 3   TRELEGY ELLIPTA  100-62.5-25 MCG/ACT AEPB Inhale 1 puff into the lungs daily.     albuterol  (VENTOLIN  HFA) 108 (90 Base) MCG/ACT inhaler 2 puffs inhaled orally every 4 to 6 hours as needed, not to exceed 12 puffs in 24 hours (Patient not taking: Reported on 04/12/2024) 18 each 1   carvedilol  (COREG ) 3.125 MG tablet TAKE 1 TABLET BY MOUTH TWICE A DAY (Patient not taking: Reported on 04/12/2024) 180  tablet 3   MEGARED OMEGA-3 KRILL OIL PO Take 1 tablet by mouth daily. (Patient not taking: Reported on 04/12/2024)     Multiple Vitamins-Minerals (CENTRUM SILVER 50+WOMEN) TABS Take 1 tablet by mouth daily. (Patient not taking: Reported on 04/12/2024)     ondansetron  (ZOFRAN ) 8 MG tablet ONE PILL EVERY 8 HOURS AS NEEDED FOR NAUSEA/VOMITTING. (Patient not taking: Reported on 04/12/2024) 40 tablet 1   pantoprazole (PROTONIX) 20 MG tablet TAKE 2 TABLETS (40 MG TOTAL) BY MOUTH EVERY MORNING. TAKE 30 MINUTES TO HOUR BEFORE BREAKFAST (Patient not taking: Reported on 04/12/2024) 180 tablet 1   No current facility-administered medications for this visit.   Facility-Administered Medications Ordered in Other Visits  Medication Dose Route Frequency Provider Last Rate Last Admin   heparin  lock flush 100 unit/mL  500 Units Intravenous Once Brahmanday, Govinda R, MD       sodium chloride  flush (NS) 0.9 % injection 10 mL  10 mL Intravenous Once Brahmanday, Govinda R, MD       sodium chloride  flush (NS) 0.9 % injection 10 mL  10 mL Intravenous Once Brahmanday, Govinda R, MD        PHYSICAL EXAMINATION: ECOG PERFORMANCE STATUS: 1 - Symptomatic but completely ambulatory  BP 107/69 (BP Location: Left Arm, Patient Position: Sitting, Cuff Size: Normal)   Pulse 72   Temp 98.7 F (37.1 C) (Tympanic)   Resp (!) 28   Wt 209 lb 11.2 oz (95.1 kg)   SpO2 100%   BMI 32.84 kg/m   Filed Weights   04/12/24 1037  Weight: 209 lb 11.2 oz (95.1 kg)   Physical Exam Vitals reviewed.  Constitutional:      Appearance: She is not ill-appearing.  Cardiovascular:     Rate and Rhythm: Normal rate and regular rhythm.  Pulmonary:     Effort: No respiratory distress.  Abdominal:     General: There is no distension.     Palpations: Abdomen is soft.     Tenderness: There is no abdominal tenderness.  Skin:    Coloration: Skin is not jaundiced or pale.  Neurological:     Mental Status: She is alert and oriented to  person, place, and time.    LABORATORY DATA:  I have reviewed the data as listed    Component Value Date/Time   NA 133 (L) 04/12/2024 1134   NA 145 (H) 09/08/2019 1359   NA 142 09/14/2013 1127   K 4.4 04/12/2024 1134   K 3.5 09/14/2013 1127   CL 96 (L) 04/12/2024 1134   CL 110 (H) 09/14/2013  1127   CO2 26 04/12/2024 1134   CO2 29 09/14/2013 1127   GLUCOSE 100 (H) 04/12/2024 1134   GLUCOSE 106 (H) 09/14/2013 1127   BUN 19 04/12/2024 1134   BUN 15 09/08/2019 1359   BUN 8 09/14/2013 1127   CREATININE 1.06 (H) 04/12/2024 1134   CREATININE 0.96 02/12/2024 0948   CREATININE 0.71 09/14/2013 1127   CREATININE 0.68 04/01/2013 1550   CALCIUM  9.4 04/12/2024 1134   CALCIUM  8.6 09/14/2013 1127   PROT 7.2 04/12/2024 1134   PROT 7.6 09/14/2013 1127   ALBUMIN 3.9 04/12/2024 1134   ALBUMIN 3.2 (L) 09/14/2013 1127   AST 21 04/12/2024 1134   AST 28 02/12/2024 0948   ALT 12 04/12/2024 1134   ALT 13 02/12/2024 0948   ALT 19 09/14/2013 1127   ALKPHOS 59 04/12/2024 1134   ALKPHOS 76 09/14/2013 1127   BILITOT 0.8 04/12/2024 1134   BILITOT 0.8 02/12/2024 0948   GFRNONAA 53 (L) 04/12/2024 1134   GFRNONAA 59 (L) 02/12/2024 0948   GFRNONAA >60 09/14/2013 1127   GFRAA >60 01/20/2020 1303   GFRAA >60 09/14/2013 1127   Lab Results  Component Value Date   WBC 9.3 04/08/2024   NEUTROABS 6.6 02/12/2024   HGB 13.7 04/08/2024   HCT 41.8 04/08/2024   MCV 94.4 04/08/2024   PLT 214 04/08/2024     Chemistry      Component Value Date/Time   NA 137 04/08/2024 1548   NA 145 (H) 09/08/2019 1359   NA 142 09/14/2013 1127   K 4.4 04/08/2024 1548   K 3.5 09/14/2013 1127   CL 96 (L) 04/08/2024 1548   CL 110 (H) 09/14/2013 1127   CO2 27 04/08/2024 1548   CO2 29 09/14/2013 1127   BUN 13 04/08/2024 1548   BUN 15 09/08/2019 1359   BUN 8 09/14/2013 1127   CREATININE 1.08 (H) 04/08/2024 1548   CREATININE 0.96 02/12/2024 0948   CREATININE 0.71 09/14/2013 1127   CREATININE 0.68 04/01/2013 1550       Component Value Date/Time   CALCIUM  9.5 04/08/2024 1548   CALCIUM  8.6 09/14/2013 1127   ALKPHOS 64 04/08/2024 1548   ALKPHOS 76 09/14/2013 1127   AST 21 04/08/2024 1548   AST 28 02/12/2024 0948   ALT 17 04/08/2024 1548   ALT 13 02/12/2024 0948   ALT 19 09/14/2013 1127   BILITOT 0.8 04/08/2024 1548   BILITOT 0.8 02/12/2024 0948       RADIOGRAPHIC STUDIES: I have personally reviewed the radiological images as listed and agreed with the findings in the report. No results found.   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 Currently on Calquence  [DISCONTINUED Rituximab -given the multifocal pneumonia]-  PET scan JUNE 23rd, 2025- No findings for recurrent lymphoma.      # Currently on Calequence 100 mg twice a day [ AUG 1st, 2022]-  STABLE-recommend informing us  prior to any dental procedures/extractions.  Calquence  can increase the risk of bleeding.  # Pancreatic mass-seen on imaging performed in ER on 04/08/2024 for epigastric pain, bloating, early satiety.  I independently reviewed imaging which showed a hypodensity of the pancreas measuring 11 mm, new compared to PET scan in June 2025.  No ductal dilatation.  Trace stranding.  Recommend CA 19-9 and CEA today.  Will also check amylase and lipase for possible infection/pancreatitis. Plan for MRI right pancreatic protocol  to evaluate further.  Will have her follow-up with Dr. Rennie for results   #  Mild anemia-  continue gentle iron.   # Immunocompromised state: Hx Multifocal pneumonia-[April 2023]-Given the multiple pneumonias/high risk of death-recommended continuation of further rituximab . MARCH 2024- IgG- 800.  Monitor for now. Stable.   # NICMP- [35-40%%- July 28th 2021]--  EVAL; NO CRT [Dr.End/Dr.Klein]July 2022- 2D echo 40 to 45% ejection fraction-  continue lasix  20 mg/day [sec to dizzy spells]; on coreg - Continue  lasix  40 mg/day- as per Cards- stable.    #Chronic respiratory failure -2 L home O2 -NICMP/pulmonary hypertension/chronic bilateral lung scarring-CT MARCH 2025- HRCT-  Pulmonary parenchymal pattern of fibrosis [Dr.Dgyali]-Tolerating Nintenaib poorly- currently OFF-  PFTs- AUg 2025- onted- defer to PCP.    # Hx of Rheumatoid arthritis-given ILD-  s/p evaluation with  Rheumatology- stable.    # Left shoulder pain/ Chronic arthritis-on meloxicam  as needed-stable.    #Vaccinations :recommend  flu shot-   COVID booster/RSV; ? Pneumonia vaccination [check with PCP]   Disposition:  Lab today (CBC, CMP, CA 19-9, CEA, LDH, immunoglobulins, amylase, lipase) Mri abd pancreatic protocol Week after mri see Dr Rennie for results- la   No problem-specific Assessment & Plan notes found for this encounter.  Orders Placed This Encounter  Procedures   MR Abdomen W Wo Contrast    Standing Status:   Future    Expected Date:   04/15/2024    Expiration Date:   04/12/2025    If indicated for the ordered procedure, I authorize the administration of contrast media per Radiology protocol:   Yes    What is the patient's sedation requirement?:   No Sedation    Does the patient have a pacemaker or implanted devices?:   No    Preferred imaging location?:   [facility] (table limit - 500lbs)   CBC with Differential/Platelet    Standing Status:   Future    Expected Date:   04/12/2024    Expiration Date:   04/12/2025   Comprehensive metabolic panel with GFR    Standing Status:   Future    Expected Date:   04/12/2024    Expiration Date:   04/12/2025   CEA    Standing Status:   Future    Expected Date:   04/12/2024    Expiration Date:   04/12/2025   Cancer antigen 19-9    Standing Status:   Future    Expected Date:   04/12/2024    Expiration Date:   04/12/2025   Lactate dehydrogenase    Standing Status:   Future    Expected Date:   04/12/2024    Expiration Date:   04/12/2025   Immunoglobulins,  QN, A/E/G/M    Standing Status:   Future    Expected Date:   04/12/2024    Expiration Date:   04/12/2025   Amylase    Standing Status:   Future    Expected Date:   04/12/2024    Expiration Date:   04/12/2025   Lipase    Standing Status:   Future    Expected Date:   04/12/2024    Expiration Date:   04/12/2025   Ambulatory referral to Social Work    Referral Priority:   Routine    Referral Type:   Consultation    Referral Reason:   Specialty Services Required    Number of Visits Requested:   1   All questions were answered. The patient knows to call the  clinic with any problems, questions or concerns.     Tinnie KANDICE Dawn, NP 04/12/2024

## 2024-04-13 ENCOUNTER — Inpatient Hospital Stay: Admitting: Licensed Clinical Social Worker

## 2024-04-13 LAB — CANCER ANTIGEN 19-9: CA 19-9: 282 U/mL — ABNORMAL HIGH (ref 0–35)

## 2024-04-13 LAB — CEA: CEA: 3.9 ng/mL (ref 0.0–4.7)

## 2024-04-13 NOTE — Progress Notes (Signed)
 [facility] CSW Counseling Note  Patient was referred by medical provider. Treatment type: Individual  Presenting Concerns: Patient and/or family reports the following symptoms/concerns: depression Duration of problem: weeks; Severity of problem: moderate   Orientation:oriented to person, place, time/date, situation, day of week, month of year, and year.   Affect: NA due to phone visit. Risk of harm to self or others: No plan to harm self or others  Patient and/or Family's Strengths/Protective Factors: Concrete supports in place (healthy food, safe environments, etc.)Capable of independent living  Communication skills  Financial means  General fund of knowledge  Supportive family/friends      Goals Addressed: Patient will:  Reduce symptoms of: depression Increase knowledge and/or ability of: self-management skills  Increase healthy adjustment to current life circumstances   Progress towards Goals: Initial   Interventions: Interventions utilized:  Motivational Interviewing and Supportive Counseling  brief mental health assessment     04/12/2024   10:43 AM 03/04/2024    8:57 AM 02/12/2024   10:42 AM  PHQ9 SCORE ONLY  PHQ-9 Total Score 9 0 0       12/15/2023    9:31 AM 11/27/2021    2:33 PM 10/02/2021    8:42 AM 07/26/2021   11:24 AM  GAD 7 : Generalized Anxiety Score  Nervous, Anxious, on Edge 0 0 0 0  Control/stop worrying 0 0 0 0  Worry too much - different things 0 0 0 0  Trouble relaxing 0 0 0 0  Restless 0 0 0 0  Easily annoyed or irritable 0 0 0 0  Afraid - awful might happen 0 0 0 0  Total GAD 7 Score 0 0 0 0  Anxiety Difficulty Not difficult at all Not difficult at all Not difficult at all Not difficult at all       Assessment: Patient currently experiencing depressive symptoms due to pain management.  She stated she has lost interest in activities that previously gave her joy.  She lives with her sister.  CSW verified patient's upcoming appointments since she  does not access MyChart.  Patient expressed having a strong faith, which helps her cope.      Plan: Follow up with CSW: Will call patient. Behavioral recommendations: Continue engaging with sister. Referral(s): None at this time.       Michelle HERO Jossiah Smoak, LCSW

## 2024-04-14 ENCOUNTER — Other Ambulatory Visit: Payer: Self-pay | Admitting: Internal Medicine

## 2024-04-14 ENCOUNTER — Other Ambulatory Visit: Payer: Self-pay

## 2024-04-14 DIAGNOSIS — C8311 Mantle cell lymphoma, lymph nodes of head, face, and neck: Secondary | ICD-10-CM

## 2024-04-14 DIAGNOSIS — K8689 Other specified diseases of pancreas: Secondary | ICD-10-CM

## 2024-04-14 LAB — IMMUNOGLOBULINS A/E/G/M, SERUM
IgA: 112 mg/dL (ref 64–422)
IgE (Immunoglobulin E), Serum: <2 [IU]/mL — ABNORMAL LOW (ref 6–495)
IgG (Immunoglobin G), Serum: 852 mg/dL (ref 586–1602)
IgM (Immunoglobulin M), Srm: 31 mg/dL (ref 26–217)

## 2024-04-14 MED ORDER — CALQUENCE 100 MG PO TABS
100.0000 mg | ORAL_TABLET | Freq: Two times a day (BID) | ORAL | 2 refills | Status: DC
  Filled 2024-04-14 – 2024-04-19 (×2): qty 60, 30d supply, fill #0

## 2024-04-15 ENCOUNTER — Ambulatory Visit: Admitting: Family

## 2024-04-18 ENCOUNTER — Inpatient Hospital Stay: Admitting: Internal Medicine

## 2024-04-18 ENCOUNTER — Encounter: Payer: Self-pay | Admitting: Internal Medicine

## 2024-04-18 DIAGNOSIS — C8311 Mantle cell lymphoma, lymph nodes of head, face, and neck: Secondary | ICD-10-CM | POA: Diagnosis not present

## 2024-04-18 MED ORDER — HYDROCODONE-ACETAMINOPHEN 5-325 MG PO TABS: 1.0000 | ORAL_TABLET | Freq: Two times a day (BID) | ORAL | 0 refills | Status: DC | PRN

## 2024-04-18 NOTE — Progress Notes (Signed)
 MRI ABD 04/12/24. Pt has been off Calquence  since she was in the hospital earlier this month. Needs refill hydrocodone , pended. Having abdominal and back pain 5/10.   States she feels like she is having hot flashes at times.

## 2024-04-18 NOTE — Addendum Note (Signed)
 Addended by: JOSHUA ALFONSO CROME on: 04/18/2024 09:26 AM   Modules accepted: Orders

## 2024-04-18 NOTE — Progress Notes (Signed)
 Michelle [name]  Patient Care Team: Michelle Rollene MATSU, FNP as PCP - General (Family Medicine) End, Michelle [name] as PCP - Cardiology (Cardiology) Michelle Elspeth BROCKS, [name] (Inactive) as PCP - Electrophysiology (Cardiology) Michelle Delon LABOR, [name] (Internal Medicine) Michelle Cindy SAUNDERS, [name] as Consulting Physician (Internal Medicine)   Cancer Staging  No matching staging information was found for the patient.    Oncology History Overview [name]  # JAN 2017- MANTLE CELL LYMPHOMA STAGE IV; [R Breast LN US  Core Bx-1.2cm LN/R Ax LN-Bx]; cyclin D Pos; Mitotic rate-LOW; MIPI score [5/intermediate risk]; BMBx-Positive for involvement. Feb 9th- START Benda-Ritux with neulasta ; Prolonged neutropenia; DISCONT- Benda-Ritux;   # April 13 th 2017- START R-CHOP x1; severe/prolonged neutropenia; PET- CR; BMBx-Neg; Disc R-CHOP # June 2022- DIAGNOSIS: thickened cortex of 12 mm. An additional lymph node demonstrates a thickened cortex of 7 mm. A third lymph node demonstrates a mildly thickened cortex of 4 mm A. LYMPH NODE, LEFT AXILLA; ULTRASOUND-GUIDED BIOPSY:  - CD5+ MONOCLONAL B-CELL POPULATION; COMPATIBLE WITH INVOLVEMENT BY THE  PATIENT'S KNOWN MANTLE CELL LYMPHOMA.   #July 2022-second week-started ibrutinib  420 mg a dayx 1 week-stop because of severe rash.  Significant clinical response noted.  # # 26th MAY 2017- Start Rituxan  q 2M Main OCT 12th 2017- PET NED.    # AUG 1st, 2022- start acalbrutinib. 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  Michelle [name] 83 y.o.  female pleasant patient multiple medical problems-ischemic cardiomyopathy; pulm hypertension; Hx of PE OFF eliquis  and above history of recurrent mantle cell lymphoma on Calquence ; here for follow-up.  Discussed the use of AI scribe software for clinical [name] transcription with the patient, who gave verbal consent to proceed.  History of Present Illness   Michelle [name] is an 83 year old female who presents with abdominal pain radiating to the back and nausea.  She has been experiencing abdominal pain radiating to her back for about a month. The pain was severe enough to cause her to nearly fall, prompting a visit to the emergency room. The pain starts in the abdomen and extends to the back.  She also experiences nausea, lack of appetite, and difficulty with bowel movements, requiring the use of prunes for regularity. She has not started any specific treatment for nausea.  A CT scan and MRI showed a small cyst in the pancreas. Previous scans did not show this cyst, and blood tests were performed to evaluate the pancreas. Some cancer markers were abnormal.  She has been taking multiple medications, including Trelegy, which she mistakenly took more frequently than prescribed, thinking it was to be taken every eight hours instead of once daily. She was previously on a different medication for her lungs, which was discontinued over two months ago. No alcohol consumption, which was discussed as a potential factor for pancreatic issues.      Patient has chronic shortness  of breath not any significantly worse.  No further hospitalizations. . Appetite is good.  Bowels are normal. Denies any fever or night sweats. Patient admits compliance to Calquence .    No chest pain no new lumps or bumps.  Review of Systems   Constitutional:  Negative for chills, diaphoresis, fever, malaise/fatigue and weight loss.  HENT:  Negative for nosebleeds and sore throat.   Eyes:  Negative for double vision.  Respiratory:  Negative for hemoptysis, sputum production and wheezing.   Cardiovascular:  Negative for chest pain, palpitations, orthopnea and leg swelling.  Gastrointestinal:  Positive for constipation. Negative for abdominal pain, blood in stool, diarrhea, heartburn, melena, nausea and vomiting.  Genitourinary:  Negative for dysuria, frequency and urgency.  Musculoskeletal:  Positive for back pain and joint pain.  Skin: Negative.  Negative for itching and rash.  Neurological:  Negative for dizziness, tingling, focal weakness, weakness and headaches.  Endo/Heme/Allergies:  Does not bruise/bleed easily.  Psychiatric/Behavioral:  Negative for depression. The patient is not nervous/anxious and does not have insomnia.     PAST MEDICAL HISTORY :  Past Medical History:  Diagnosis Date   Arthritis    Collagen vascular disease    GERD (gastroesophageal reflux disease)    Headache(784.0)    HFrEF (heart failure with reduced ejection fraction) (HCC)    Nonischemic cardiomyopathy, LVEF as low as 30-35% in 08/2019   History of methotrexate  therapy    Hyperlipidemia    hx   Lymphadenopathy of head and neck 01/2015   see on Thyroid  ultrasound   Lymphoma, mantle cell (HCC) 06/01/2015   bx of lymph node in right breast/Stage IV Mantle Cell Lymphoma   Multifocal pneumonia 10/05/2021   Personal history of chemotherapy    Pulmonary embolism (HCC) 10/05/2021   acute   Respiratory failure (HCC) 10/05/2021   acute   Rheumatoid arthritis (HCC)     PAST SURGICAL HISTORY :   Past Surgical History:  Procedure Laterality Date   BREAST BIOPSY Left 11/07/2020   u/s bx-hydromark #3 coil-path pending   CARDIAC CATHETERIZATION  09/2004   [facility]; EF 60%   CARDIAC CATHETERIZATION  08/2004   [facility]   IR FLUORO GUIDED NEEDLE PLC  ASPIRATION/INJECTION LOC  06/18/2018   PERIPHERAL VASCULAR CATHETERIZATION N/A 07/04/2015   Procedure: Pat Cath Insertion;  Surgeon: Selinda GORMAN Gu, [name];  Location: [facility] INVASIVE CV LAB;  Service: Cardiovascular;  Laterality: N/A;   PORTA CATH REMOVAL N/A 06/23/2018   Procedure: PORTA CATH REMOVAL;  Surgeon: Gu Selinda GORMAN, [name];  Location: [facility] INVASIVE CV LAB;  Service: Cardiovascular;  Laterality: N/A;   RIGHT/LEFT HEART CATH AND CORONARY ANGIOGRAPHY Bilateral 09/20/2019   Procedure: RIGHT/LEFT HEART CATH AND CORONARY ANGIOGRAPHY;  Surgeon: Mady Bruckner, [name];  Location: [facility] INVASIVE CV LAB;  Service: Cardiovascular;  Laterality: Bilateral;    FAMILY HISTORY :   Family History  Problem Relation Age of Onset   Diabetes Mother    Cholelithiasis Mother    Hypertension Sister    Diabetes Sister    Heart murmur Sister    Arthritis Brother    Breast cancer Neg Hx     SOCIAL HISTORY:   Social History   Tobacco Use   Smoking status: Never   Smokeless tobacco: Never  Vaping Use   Vaping status: Never Used  Substance Use Topics   Alcohol use: No   Drug use: No    ALLERGIES:  has no known allergies.  MEDICATIONS:  Current Outpatient Medications  Medication Sig Dispense Refill  carvedilol  (COREG ) 3.125 MG tablet TAKE 1 TABLET BY MOUTH TWICE A DAY 180 tablet 3   feeding supplement (ENSURE ENLIVE / ENSURE PLUS) LIQD Take 237 mLs by mouth 3 (three) times daily between meals. (Patient taking differently: Take 237 mLs by mouth daily.) 237 mL 12   furosemide  (LASIX ) 40 MG tablet TAKE 1 TABLET BY MOUTH EVERY DAY 90 tablet 2   hydroxychloroquine  (PLAQUENIL ) 200 MG tablet Take 1 tablet by mouth 2 (two) times daily.     ipratropium-albuterol  (DUONEB) 0.5-2.5 (3) MG/3ML SOLN Take 3 mLs by nebulization every 6 (six) hours as needed. 300 mL 11   MEGARED OMEGA-3 KRILL OIL PO Take 1 tablet by mouth daily.     Multiple Vitamins-Minerals (CENTRUM SILVER 50+WOMEN) TABS Take 1 tablet by mouth daily.      ondansetron  (ZOFRAN ) 8 MG tablet ONE PILL EVERY 8 HOURS AS NEEDED FOR NAUSEA/VOMITTING. 40 tablet 1   OXYGEN  Inhale into the lungs. 2 Liters 24/7     pantoprazole (PROTONIX) 20 MG tablet TAKE 2 TABLETS (40 MG TOTAL) BY MOUTH EVERY MORNING. TAKE 30 MINUTES TO HOUR BEFORE BREAKFAST 180 tablet 1   potassium chloride  (KLOR-CON  M) 10 MEQ tablet TAKE 1 TABLET BY MOUTH EVERY DAY 90 tablet 1   rosuvastatin  (CRESTOR ) 5 MG tablet TAKE 1 TABLET (5 MG TOTAL) BY MOUTH DAILY. 90 tablet 3   TRELEGY ELLIPTA  100-62.5-25 MCG/ACT AEPB Inhale 1 puff into the lungs daily.     acalabrutinib  maleate (CALQUENCE ) 100 MG tablet Take 1 tablet (100 mg total) by mouth 2 (two) times daily. (Patient not taking: Reported on 04/18/2024) 60 tablet 2   HYDROcodone -acetaminophen  (NORCO/VICODIN) 5-325 MG tablet Take 1 tablet by mouth 2 (two) times daily as needed for moderate pain (pain score 4-6). 60 tablet 0   losartan  (COZAAR ) 25 MG tablet TAKE 1/2 TABLET BY MOUTH EVERY DAY (Patient not taking: Reported on 04/18/2024) 45 tablet 0   No current facility-administered medications for this visit.   Facility-Administered Medications Ordered in Other Visits  Medication Dose Route Frequency Provider Last Rate Last Admin   heparin  lock flush 100 unit/mL  500 Units Intravenous Once Teneil Shiller R, [name]       sodium chloride  flush (NS) 0.9 % injection 10 mL  10 mL Intravenous Once Malena Timpone R, [name]       sodium chloride  flush (NS) 0.9 % injection 10 mL  10 mL Intravenous Once Dastan Krider R, [name]        PHYSICAL EXAMINATION: ECOG PERFORMANCE STATUS: 0 - Asymptomatic  BP 100/68 (BP Location: Left Arm, Patient Position: Sitting, Cuff Size: Large)   Pulse 73   Temp (!) 97.3 F (36.3 C) (Tympanic)   Resp 18   Ht 5' 7 (1.702 m)   Wt 206 lb (93.4 kg)   SpO2 98%   BMI 32.26 kg/m   Filed Weights   04/18/24 0839  Weight: 206 lb (93.4 kg)       Physical Exam HENT:     Head: Normocephalic and atraumatic.      Mouth/Throat:     Pharynx: No oropharyngeal exudate.  Eyes:     Pupils: Pupils are equal, round, and reactive to light.  Cardiovascular:     Rate and Rhythm: Normal rate and regular rhythm.  Pulmonary:     Effort: No respiratory distress.     Breath sounds: No wheezing.     Comments: Bilateral basilar crackles Abdominal:     General: Bowel sounds are normal. There is  no distension.     Palpations: Abdomen is soft. There is no mass.     Tenderness: There is no abdominal tenderness. There is no guarding or rebound.  Musculoskeletal:        General: No tenderness. Normal range of motion.     Cervical back: Normal range of motion and neck supple.  Skin:    General: Skin is warm.  Neurological:     Mental Status: She is alert and oriented to person, place, and time.  Psychiatric:        Mood and Affect: Affect normal.    LABORATORY DATA:  I have reviewed the data as listed    Component Value Date/Time   NA 133 (L) 04/12/2024 1134   NA 145 (H) 09/08/2019 1359   NA 142 09/14/2013 1127   K 4.4 04/12/2024 1134   K 3.5 09/14/2013 1127   CL 96 (L) 04/12/2024 1134   CL 110 (H) 09/14/2013 1127   CO2 26 04/12/2024 1134   CO2 29 09/14/2013 1127   GLUCOSE 100 (H) 04/12/2024 1134   GLUCOSE 106 (H) 09/14/2013 1127   BUN 19 04/12/2024 1134   BUN 15 09/08/2019 1359   BUN 8 09/14/2013 1127   CREATININE 1.06 (H) 04/12/2024 1134   CREATININE 0.96 02/12/2024 0948   CREATININE 0.71 09/14/2013 1127   CREATININE 0.68 04/01/2013 1550   CALCIUM  9.4 04/12/2024 1134   CALCIUM  8.6 09/14/2013 1127   PROT 7.2 04/12/2024 1134   PROT 7.6 09/14/2013 1127   ALBUMIN 3.9 04/12/2024 1134   ALBUMIN 3.2 (L) 09/14/2013 1127   AST 21 04/12/2024 1134   AST 28 02/12/2024 0948   ALT 12 04/12/2024 1134   ALT 13 02/12/2024 0948   ALT 19 09/14/2013 1127   ALKPHOS 59 04/12/2024 1134   ALKPHOS 76 09/14/2013 1127   BILITOT 0.8 04/12/2024 1134   BILITOT 0.8 02/12/2024 0948   GFRNONAA 53 (L) 04/12/2024 1134    GFRNONAA 59 (L) 02/12/2024 0948   GFRNONAA >60 09/14/2013 1127   GFRAA >60 01/20/2020 1303   GFRAA >60 09/14/2013 1127    No results found for: SPEP, UPEP  Lab Results  Component Value Date   WBC 8.6 04/12/2024   NEUTROABS 6.8 04/12/2024   HGB 13.9 04/12/2024   HCT 42.1 04/12/2024   MCV 94.0 04/12/2024   PLT 221 04/12/2024      Chemistry      Component Value Date/Time   NA 133 (L) 04/12/2024 1134   NA 145 (H) 09/08/2019 1359   NA 142 09/14/2013 1127   K 4.4 04/12/2024 1134   K 3.5 09/14/2013 1127   CL 96 (L) 04/12/2024 1134   CL 110 (H) 09/14/2013 1127   CO2 26 04/12/2024 1134   CO2 29 09/14/2013 1127   BUN 19 04/12/2024 1134   BUN 15 09/08/2019 1359   BUN 8 09/14/2013 1127   CREATININE 1.06 (H) 04/12/2024 1134   CREATININE 0.96 02/12/2024 0948   CREATININE 0.71 09/14/2013 1127   CREATININE 0.68 04/01/2013 1550      Component Value Date/Time   CALCIUM  9.4 04/12/2024 1134   CALCIUM  8.6 09/14/2013 1127   ALKPHOS 59 04/12/2024 1134   ALKPHOS 76 09/14/2013 1127   AST 21 04/12/2024 1134   AST 28 02/12/2024 0948   ALT 12 04/12/2024 1134   ALT 13 02/12/2024 0948   ALT 19 09/14/2013 1127   BILITOT 0.8 04/12/2024 1134   BILITOT 0.8 02/12/2024 0948       RADIOGRAPHIC  STUDIES: I have personally reviewed the radiological images as listed and agreed with the findings in the report. No results found.   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   # Currently on Calequence 100 mg twice a day [ AUG 1st, 2022]-    # HOLD Calequence ? Causing pancreatitis- especially given NED on recent CT scan- CAP. STABLE.  Discussed with pharmacy to rule out any medication induced pancreatitis.  #  Mild anemia-  continue gentle  iron.  # NOV 2025- abdominal pain- ? 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 Short-term follow-up examination is recommended in 3 months to document resolution; There is a 4 x 8 mm loculated T2 hyperintense nonaggressive lesion in the pancreatic body. Differential diagnosis includes pancreatic side branch IPMN versus pancreatic pseudocyst. HOLD calequence x1 month until dec mid 2025- also will re-evaluate at the cancer conference.  Consider GI evaluation if not improved.  # Immunocompromised state: Hx Multifocal pneumonia-[April 2023]-Given the multiple pneumonias/high risk of death-recommended continuation of further rituximab . MARCH 2024- IgG- 800.  Monitor for now. Stable.  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   #Chronic respiratory failure -2 L home O2 -NICMP/pulmonary hypertension/chronic bilateral lung scarring-CT MARCH 2025- HRCT-  Pulmonary parenchymal pattern of fibrosis [Dr.Dgyali]-currently OFF Nintenaib poorly-  PFTs- AUg 2025-on Trelegy.   # Hx of Rheumatoid arthritis-given ILD-  s/p evaluation with  Rheumatology- stable.   # Left shoulder pain/ Chronic arthritis-on meloxicam  as needed-stable.   #Vaccinations :recommend  flu shot-   COVID booster/RSV; ? Pneumonia vaccination [check with PCP]  # DISPOSITION:  # keep appts as planned in dec 2025.Friday  [name] ;labs- cbc/cmp/ldh/BNP; immunoglobulins; ca-19-9-;- -Dr.B  # I reviewed the blood work- with the patient in detail; also reviewed the imaging independently [as summarized above]; and with the patient in detail.        No orders of the defined types were placed in this encounter.    All questions were answered. The  patient knows to call the clinic with any problems, questions or concerns.      Cindy JONELLE Joe, [name] 04/18/2024 9:23 AM

## 2024-04-18 NOTE — Assessment & Plan Note (Addendum)
#  2016-2017- Mantle cell lymphoma recurrent biopsy-proven [July 2022]; Currently on Calquence  [ DISCONTINUED  Rituximab -given the multifocal pneumonia]-  PET scan JUNE 23rd, 2025- No findings for recurrent lymphoma.   # Currently on Calequence 100 mg twice a day [ AUG 1st, 2022]-    # HOLD Calequence ? Causing pancreatitis- especially given NED on recent CT scan- CAP. STABLE.  Discussed with pharmacy to rule out any medication induced pancreatitis.  #  Mild anemia-  continue gentle iron.  # NOV 2025- abdominal pain- ? 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 Short-term follow-up examination is recommended in 3 months to document resolution; There is a 4 x 8 mm loculated T2 hyperintense nonaggressive lesion in the pancreatic body. Differential diagnosis includes pancreatic side branch IPMN versus pancreatic pseudocyst. HOLD calequence x1 month until dec mid 2025- also will re-evaluate at the cancer conference.  Consider GI evaluation if not improved.  # Immunocompromised state: Hx Multifocal pneumonia-[April 2023]-Given the multiple pneumonias/high risk of death-recommended continuation of further rituximab . MARCH 2024- IgG- 800.  Monitor for now. Stable.  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   #Chronic respiratory failure -2 L home O2 -NICMP/pulmonary hypertension/chronic bilateral lung scarring-CT MARCH 2025- HRCT-  Pulmonary parenchymal pattern of fibrosis [Dr.Dgyali]-currently OFF Nintenaib poorly-  PFTs- AUg 2025-on Trelegy.   # Hx of Rheumatoid arthritis-given ILD-  s/p evaluation with  Rheumatology- stable.   # Left shoulder pain/ Chronic arthritis-on meloxicam  as needed-stable.    #Vaccinations :recommend  flu shot-   COVID booster/RSV; ? Pneumonia vaccination [check with PCP]  # DISPOSITION:  # keep appts as planned in dec 2025.Friday  MD ;labs- cbc/cmp/ldh/BNP; immunoglobulins; ca-19-9-;- -Dr.B  # I reviewed the blood work- with the patient in detail; also reviewed the imaging independently [as summarized above]; and with the patient in detail.

## 2024-04-19 ENCOUNTER — Inpatient Hospital Stay

## 2024-04-19 ENCOUNTER — Other Ambulatory Visit: Payer: Self-pay

## 2024-04-19 NOTE — Progress Notes (Signed)
 [facility] CSW Progress Note  Clinical Child Psychotherapist contacted patient by phone to follow-up on emotional support.  Patient reports improved mood with no depression currently.  She said her medications have been changed and she feels that has helped her feel better.  Her sister remains supportive.    Interventions: Provided brief mental health counseling with regard to adjusting to her illness.       Follow Up Plan:  Patient will contact CSW with any support or resource needs    Macario CHRISTELLA Au, LCSW Clinical Social Worker [facility]

## 2024-04-21 ENCOUNTER — Inpatient Hospital Stay

## 2024-04-21 ENCOUNTER — Other Ambulatory Visit (HOSPITAL_COMMUNITY): Payer: Self-pay

## 2024-04-25 ENCOUNTER — Ambulatory Visit: Admitting: Family

## 2024-04-25 NOTE — Progress Notes (Deleted)
 Assessment & Plan:  There are no diagnoses linked to this encounter.   Return precautions given.   Risks, benefits, and alternatives of the medications and treatment plan prescribed today were discussed, and patient expressed understanding.   Education regarding symptom management and diagnosis given to patient on AVS either electronically or printed.  No follow-ups on file.  [name], FNP  Subjective:    Patient ID: Michelle [name], female    DOB: 1940/07/08, 83 y.o.   MRN: [id number]  CC: Michelle [name] is a 83 y.o. female who presents today for follow up.   HPI: HPI Presented to emergency room 04/08/2024 for epigastric pain ; collaborated with Dr Amey, arranged follow-up as outpatient.   follow-up Dr Amey 04/18/2024 for surveillance of history of mantle cell lymphoma.  Questions if calequence causing pancreatitis.  Mild anemia.  Ill-defined lesion centered pancreatic head. Differential diagnosis includes pancreatic side branch IPMN versus pancreatic pseudocyst. HOLD calequence x1 month until dec mid 2025- also will re-evaluate at the cancer conference.  Consider GI evaluation if not improved   MR abdomen 04/12/24 There is a 4 x 8 mm loculated T2 hyperintense nonaggressive lesion in the pancreatic body.  WBC 8.6  Allergies: Patient has no known allergies. Current Outpatient Medications on File Prior to Visit  Medication Sig Dispense Refill   acalabrutinib  maleate (CALQUENCE ) 100 MG tablet Take 1 tablet (100 mg total) by mouth 2 (two) times daily. (Patient not taking: Reported on 04/18/2024) 60 tablet 2   carvedilol  (COREG ) 3.125 MG tablet TAKE 1 TABLET BY MOUTH TWICE A DAY 180 tablet 3   feeding supplement (ENSURE ENLIVE / ENSURE PLUS) LIQD Take 237 mLs by mouth 3 (three) times daily between meals. (Patient taking differently: Take 237 mLs by mouth daily.) 237 mL 12   furosemide  (LASIX ) 40 MG tablet TAKE 1 TABLET BY MOUTH EVERY DAY 90 tablet 2    HYDROcodone -acetaminophen  (NORCO/VICODIN) 5-325 MG tablet Take 1 tablet by mouth 2 (two) times daily as needed for moderate pain (pain score 4-6). 60 tablet 0   hydroxychloroquine  (PLAQUENIL ) 200 MG tablet Take 1 tablet by mouth 2 (two) times daily.     ipratropium-albuterol  (DUONEB) 0.5-2.5 (3) MG/3ML SOLN Take 3 mLs by nebulization every 6 (six) hours as needed. 300 mL 11   losartan  (COZAAR ) 25 MG tablet TAKE 1/2 TABLET BY MOUTH EVERY DAY (Patient not taking: Reported on 04/18/2024) 45 tablet 0   MEGARED OMEGA-3 KRILL OIL PO Take 1 tablet by mouth daily.     Multiple Vitamins-Minerals (CENTRUM SILVER 50+WOMEN) TABS Take 1 tablet by mouth daily.     ondansetron  (ZOFRAN ) 8 MG tablet ONE PILL EVERY 8 HOURS AS NEEDED FOR NAUSEA/VOMITTING. 40 tablet 1   OXYGEN  Inhale into the lungs. 2 Liters 24/7     pantoprazole  (PROTONIX ) 20 MG tablet TAKE 2 TABLETS (40 MG TOTAL) BY MOUTH EVERY MORNING. TAKE 30 MINUTES TO HOUR BEFORE BREAKFAST 180 tablet 1   potassium chloride  (KLOR-CON  M) 10 MEQ tablet TAKE 1 TABLET BY MOUTH EVERY DAY 90 tablet 1   rosuvastatin  (CRESTOR ) 5 MG tablet TAKE 1 TABLET (5 MG TOTAL) BY MOUTH DAILY. 90 tablet 3   TRELEGY ELLIPTA  100-62.5-25 MCG/ACT AEPB Inhale 1 puff into the lungs daily.     Current Facility-Administered Medications on File Prior to Visit  Medication Dose Route Frequency Provider Last Rate Last Admin   heparin  lock flush 100 unit/mL  500 Units Intravenous Once Brahmanday, Govinda R, MD  sodium chloride  flush (NS) 0.9 % injection 10 mL  10 mL Intravenous Once Brahmanday, Govinda R, MD       sodium chloride  flush (NS) 0.9 % injection 10 mL  10 mL Intravenous Once Brahmanday, Govinda R, MD        Review of Systems    Objective:    There were no vitals taken for this visit. BP Readings from Last 3 Encounters:  04/18/24 100/68  04/12/24 107/69  04/08/24 93/66   Wt Readings from Last 3 Encounters:  04/18/24 206 lb (93.4 kg)  04/12/24 209 lb 11.2 oz (95.1 kg)   04/08/24 200 lb (90.7 kg)    Physical Exam

## 2024-04-26 ENCOUNTER — Telehealth: Payer: Self-pay

## 2024-04-26 NOTE — Telephone Encounter (Signed)
 Call placed to Michelle [name] to review tumor board recommendations.  No answer and voicemail is full.

## 2024-05-10 ENCOUNTER — Telehealth: Payer: Self-pay

## 2024-05-10 NOTE — Telephone Encounter (Signed)
 2nd attempt made to contact Michelle [name] with tumor board recommendations. No answer and voicemail is full. She does have an upcoming appointment with medical oncology 12/19.

## 2024-05-20 ENCOUNTER — Inpatient Hospital Stay: Admitting: Internal Medicine

## 2024-05-20 ENCOUNTER — Other Ambulatory Visit: Payer: Self-pay

## 2024-05-20 ENCOUNTER — Encounter: Payer: Self-pay | Admitting: Internal Medicine

## 2024-05-20 ENCOUNTER — Telehealth: Payer: Self-pay

## 2024-05-20 ENCOUNTER — Ambulatory Visit: Admitting: Internal Medicine

## 2024-05-20 ENCOUNTER — Inpatient Hospital Stay

## 2024-05-20 ENCOUNTER — Other Ambulatory Visit

## 2024-05-20 ENCOUNTER — Inpatient Hospital Stay: Attending: Internal Medicine

## 2024-05-20 VITALS — BP 107/70 | HR 71 | Temp 96.4°F | Ht 67.0 in | Wt 199.0 lb

## 2024-05-20 DIAGNOSIS — C8311 Mantle cell lymphoma, lymph nodes of head, face, and neck: Secondary | ICD-10-CM

## 2024-05-20 DIAGNOSIS — Z9221 Personal history of antineoplastic chemotherapy: Secondary | ICD-10-CM | POA: Diagnosis not present

## 2024-05-20 DIAGNOSIS — Z86711 Personal history of pulmonary embolism: Secondary | ICD-10-CM | POA: Diagnosis not present

## 2024-05-20 DIAGNOSIS — Z79899 Other long term (current) drug therapy: Secondary | ICD-10-CM | POA: Insufficient documentation

## 2024-05-20 DIAGNOSIS — I272 Pulmonary hypertension, unspecified: Secondary | ICD-10-CM | POA: Diagnosis not present

## 2024-05-20 DIAGNOSIS — K8689 Other specified diseases of pancreas: Secondary | ICD-10-CM

## 2024-05-20 DIAGNOSIS — M069 Rheumatoid arthritis, unspecified: Secondary | ICD-10-CM | POA: Insufficient documentation

## 2024-05-20 DIAGNOSIS — I5022 Chronic systolic (congestive) heart failure: Secondary | ICD-10-CM | POA: Diagnosis not present

## 2024-05-20 DIAGNOSIS — R109 Unspecified abdominal pain: Secondary | ICD-10-CM | POA: Diagnosis not present

## 2024-05-20 DIAGNOSIS — J9610 Chronic respiratory failure, unspecified whether with hypoxia or hypercapnia: Secondary | ICD-10-CM | POA: Insufficient documentation

## 2024-05-20 DIAGNOSIS — C831A Mantle cell lymphoma, in remission: Secondary | ICD-10-CM | POA: Insufficient documentation

## 2024-05-20 DIAGNOSIS — C250 Malignant neoplasm of head of pancreas: Secondary | ICD-10-CM | POA: Diagnosis present

## 2024-05-20 LAB — CBC WITH DIFFERENTIAL (CANCER CENTER ONLY)
Abs Immature Granulocytes: 0.05 K/uL (ref 0.00–0.07)
Basophils Absolute: 0 K/uL (ref 0.0–0.1)
Basophils Relative: 0 %
Eosinophils Absolute: 0.1 K/uL (ref 0.0–0.5)
Eosinophils Relative: 1 %
HCT: 42.3 % (ref 36.0–46.0)
Hemoglobin: 14.1 g/dL (ref 12.0–15.0)
Immature Granulocytes: 1 %
Lymphocytes Relative: 10 %
Lymphs Abs: 0.9 K/uL (ref 0.7–4.0)
MCH: 30.8 pg (ref 26.0–34.0)
MCHC: 33.3 g/dL (ref 30.0–36.0)
MCV: 92.4 fL (ref 80.0–100.0)
Monocytes Absolute: 0.4 K/uL (ref 0.1–1.0)
Monocytes Relative: 4 %
Neutro Abs: 7.3 K/uL (ref 1.7–7.7)
Neutrophils Relative %: 84 %
Platelet Count: 273 K/uL (ref 150–400)
RBC: 4.58 MIL/uL (ref 3.87–5.11)
RDW: 12.7 % (ref 11.5–15.5)
WBC Count: 8.7 K/uL (ref 4.0–10.5)
nRBC: 0 % (ref 0.0–0.2)

## 2024-05-20 LAB — CMP (CANCER CENTER ONLY)
ALT: 14 U/L (ref 0–44)
AST: 22 U/L (ref 15–41)
Albumin: 4.3 g/dL (ref 3.5–5.0)
Alkaline Phosphatase: 81 U/L (ref 38–126)
Anion gap: 11 (ref 5–15)
BUN: 18 mg/dL (ref 8–23)
CO2: 29 mmol/L (ref 22–32)
Calcium: 10.2 mg/dL (ref 8.9–10.3)
Chloride: 96 mmol/L — ABNORMAL LOW (ref 98–111)
Creatinine: 0.92 mg/dL (ref 0.44–1.00)
GFR, Estimated: >60 mL/min
Glucose, Bld: 103 mg/dL — ABNORMAL HIGH (ref 70–99)
Potassium: 3.9 mmol/L (ref 3.5–5.1)
Sodium: 136 mmol/L (ref 135–145)
Total Bilirubin: 0.5 mg/dL (ref 0.0–1.2)
Total Protein: 7.3 g/dL (ref 6.5–8.1)

## 2024-05-20 LAB — LACTATE DEHYDROGENASE: LDH: 223 U/L (ref 105–235)

## 2024-05-20 MED ORDER — HYDROCODONE-ACETAMINOPHEN 5-325 MG PO TABS: 1.0000 | ORAL_TABLET | Freq: Two times a day (BID) | ORAL | 0 refills | Status: DC | PRN

## 2024-05-20 NOTE — Progress Notes (Signed)
 Patient states pain in her abdomen radiating towards her back for the last month, rating a 9 out of 10 today. Recent ED visit at [facility] 3 weeks ago at the onset. She has stopped taking majority of her medications because it causes her to be nauseous and doe not help her pain.

## 2024-05-20 NOTE — Assessment & Plan Note (Addendum)
#  2016-2017- Mantle cell lymphoma recurrent biopsy-proven [July 2022]; Currently on Calquence  [ DISCONTINUED  Rituximab -given the multifocal pneumonia]-  PET scan JUNE 23rd, 2025- No findings for recurrent lymphoma.   # Currently on Calequence 100 mg twice a day [ AUG 1st, 2022]-    # CONTINUE TO HOLD Calequence ? Causing pancreatitis- especially given NED on recent CT scan- CAP.  Stable from Lymphoma standpoint. See below-no significant improvement even after holding Calquence  for a month.  # NOV 2025- abdominal pain- ? 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 Short-term follow-up examination is recommended in 3 months to document resolution; There is a 4 x 8 mm loculated T2 hyperintense non-aggressive lesion in the pancreatic body.  Elevated tumor markers-CA 19-9.  Recommend endoscopic ultrasound for further evaluation.  # Immunocompromised state: Hx Multifocal pneumonia-[April 2023]-Given the multiple pneumonias/high risk of death-recommended continuation of further rituximab . MARCH 2024- IgG- 800.  Monitor for now. Stable.  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   #Chronic respiratory failure -2 L home O2 -NICMP/pulmonary hypertension/chronic bilateral lung scarring-CT MARCH 2025- HRCT-  Pulmonary parenchymal pattern of fibrosis [Dr.Dgyali]-currently OFF Nintenaib poorly-  PFTs- AUg 2025-on Trelegy.   # Hx of Rheumatoid arthritis-given ILD-  s/p evaluation with  Rheumatology- stable.   # Left shoulder pain/ Chronic arthritis-on meloxicam  as needed- stable.   #Vaccinations :recommend  flu shot-   COVID booster/RSV; ? Pneumonia vaccination [check with PCP]  # DISPOSITION:  #  follow up in 1 month  MD ;labs- cbc/cmp/ldh/BNP;  ca-19-9-;- -Dr.B  Addendum: Discussed with GI nurse navigator-patient preferred to [facility] for endoscopic ultrasound and the biopsy.  I have also reached out to patient's niece Tammy with the concern for elevated tumor markers and the need to perform endoscopic ultrasound and biopsy as soon as possible.  Follow-up as planned

## 2024-05-20 NOTE — Progress Notes (Signed)
 [facility] OFFICE PROGRESS NOTE  Patient Care Team: Dineen Rollene MATSU, FNP as PCP - General (Family Medicine) End, Lonni, MD as PCP - Cardiology (Cardiology) Fernande Elspeth BROCKS, MD (Inactive) as PCP - Electrophysiology (Cardiology) [name], MD (Internal Medicine) Rennie Cindy SAUNDERS, MD as Consulting Physician (Oncology)   Cancer Staging  No matching staging information was found for the patient.    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 Feb 9th- START Benda-Ritux with neulasta ; Prolonged neutropenia; DISCONT- Benda-Ritux;   # April 13 th 2017- START R-CHOP x1; severe/prolonged neutropenia; PET- CR; BMBx-Neg; Disc R-CHOP # June 2022- DIAGNOSIS: thickened cortex of 12 mm. An additional lymph node demonstrates a thickened cortex of 7 mm. A third lymph node demonstrates a mildly thickened cortex of 4 mm A. LYMPH NODE, LEFT AXILLA; ULTRASOUND-GUIDED BIOPSY:  - CD5+ MONOCLONAL B-CELL POPULATION; COMPATIBLE WITH INVOLVEMENT BY THE  PATIENT'S KNOWN MANTLE CELL LYMPHOMA.   #July 2022-second week-started ibrutinib  420 mg a dayx 1 week-stop because of severe rash.  Significant clinical response noted.  # # 26th MAY 2017- Start Rituxan  q 55M Main OCT 12th 2017- PET NED.    # AUG 1st, 2022- start acalbrutinib. 9/23-2022-rituximab  weekly; Stop Rituxan  maintenance s/p 2  monthly  [facility] 2023-pneumonia]  # March 31st, 2023- right upper lobe pulmonary artery branches  consistent with pulmonary embolism- on eliquis  [until dec 2024]-    # Rheumatoid Arthritis [on MXT]; March 2017-MUGA scan-51 % --------------------------------------------------------    DIAGNOSIS: [jan 2017 ] Mantle cell lymphoma  STAGE: 4       Mantle cell lymphoma of lymph nodes of head, face, and neck (HCC)   02/22/2021 - 07/12/2021 Chemotherapy   Patient is on Treatment Plan : Rituximab  q 4 W      INTERVAL HISTORY: Accompanied by her brother ambulating wheelchair.  Michelle [name] 83 y.o.  female pleasant patient multiple medical problems-ischemic cardiomyopathy; pulm hypertension; Hx of PE OFF eliquis  and above history of recurrent mantle cell lymphoma on Calquence - on HOLD sec to abdominal pain; here for follow-up.  Discussed the use of AI scribe software for clinical note transcription with the patient, who gave verbal consent to proceed.  History of Present Illness   Michelle [name] is an 83 year old female with mantle cell lymphoma who presents with persistent, severe upper abdominal pain and a newly identified pancreatic cyst.  She was most recently treated with acalabrutinib , which was discontinued several weeks ago due to the onset of severe abdominal pain. She has not resumed this therapy since the pain began.  She describes persistent, severe upper abdominal pain radiating to the back, worsening over time and most pronounced at night when lying down. The pain is only partially relieved by hydrocodone  and acetaminophen , which she takes two to three times daily, previously up to four times daily when symptoms were more severe.  She reports significant anorexia, with loss of appetite and nausea triggered by the smell or sight of food, and is unable to tolerate even water without discomfort. She denies vomiting but frequently feels nauseated and must leave the area to avoid worsening symptoms. She has had only one good night's sleep since symptom onset, requiring sedation for relief, and reports profound fatigue and low energy, attributed to poor oral intake.  Imaging has identified a small cystic lesion in the pancreas  and cholelithiasis. CA 19-9 is elevated (200-280, normal <35). She has noticed palpable 'lumps' or 'knots' in her abdomen.      Review of Systems  Constitutional:   Negative for chills, diaphoresis, fever, malaise/fatigue and weight loss.  HENT:  Negative for nosebleeds and sore throat.   Eyes:  Negative for double vision.  Respiratory:  Negative for hemoptysis, sputum production and wheezing.   Cardiovascular:  Negative for chest pain, palpitations, orthopnea and leg swelling.  Gastrointestinal:  Positive for constipation. Negative for abdominal pain, blood in stool, diarrhea, heartburn, melena, nausea and vomiting.  Genitourinary:  Negative for dysuria, frequency and urgency.  Musculoskeletal:  Positive for back pain and joint pain.  Skin: Negative.  Negative for itching and rash.  Neurological:  Negative for dizziness, tingling, focal weakness, weakness and headaches.  Endo/Heme/Allergies:  Does not bruise/bleed easily.  Psychiatric/Behavioral:  Negative for depression. The patient is not nervous/anxious and does not have insomnia.     PAST MEDICAL HISTORY :  Past Medical History:  Diagnosis Date   Arthritis    Collagen vascular disease    GERD (gastroesophageal reflux disease)    Headache(784.0)    HFrEF (heart failure with reduced ejection fraction) (HCC)    Nonischemic cardiomyopathy, LVEF as low as 30-35% in 08/2019   History of methotrexate  therapy    Hyperlipidemia    hx   Lymphadenopathy of head and neck 01/2015   see on Thyroid  ultrasound   Lymphoma, mantle cell (HCC) 06/01/2015   bx of lymph node in right breast/Stage IV Mantle Cell Lymphoma   Multifocal pneumonia 10/05/2021   Personal history of chemotherapy    Pulmonary embolism (HCC) 10/05/2021   acute   Respiratory failure (HCC) 10/05/2021   acute   Rheumatoid arthritis (HCC)     PAST SURGICAL HISTORY :   Past Surgical History:  Procedure Laterality Date   BREAST BIOPSY Left 11/07/2020   u/s bx-hydromark #3 coil-path pending   CARDIAC CATHETERIZATION  09/2004   [facility]; EF 60%   CARDIAC CATHETERIZATION  08/2004   [facility]   IR FLUORO GUIDED NEEDLE PLC ASPIRATION/INJECTION  LOC  06/18/2018   PERIPHERAL VASCULAR CATHETERIZATION N/A 07/04/2015   Procedure: Pat Cath Insertion;  Surgeon: Selinda GORMAN Gu, MD;  Location: [facility] INVASIVE CV LAB;  Service: Cardiovascular;  Laterality: N/A;   PORTA CATH REMOVAL N/A 06/23/2018   Procedure: PORTA CATH REMOVAL;  Surgeon: Gu Selinda GORMAN, MD;  Location: [facility] INVASIVE CV LAB;  Service: Cardiovascular;  Laterality: N/A;   RIGHT/LEFT HEART CATH AND CORONARY ANGIOGRAPHY Bilateral 09/20/2019   Procedure: RIGHT/LEFT HEART CATH AND CORONARY ANGIOGRAPHY;  Surgeon: Mady Bruckner, MD;  Location: [facility] INVASIVE CV LAB;  Service: Cardiovascular;  Laterality: Bilateral;    FAMILY HISTORY :   Family History  Problem Relation Age of Onset   Diabetes Mother    Cholelithiasis Mother    Hypertension Sister    Diabetes Sister    Heart murmur Sister    Arthritis Brother    Breast cancer Neg Hx     SOCIAL HISTORY:   Social History   Tobacco Use   Smoking status: Never   Smokeless tobacco: Never  Vaping Use   Vaping status: Never Used  Substance Use Topics   Alcohol use: No   Drug use: No    ALLERGIES:  has no known allergies.  MEDICATIONS:  Current Outpatient Medications  Medication Sig Dispense Refill   carvedilol  (COREG ) 3.125 MG tablet TAKE 1 TABLET BY MOUTH TWICE A DAY 180 tablet  3   feeding supplement (ENSURE ENLIVE / ENSURE PLUS) LIQD Take 237 mLs by mouth 3 (three) times daily between meals. (Patient taking differently: Take 237 mLs by mouth daily.) 237 mL 12   hydroxychloroquine  (PLAQUENIL ) 200 MG tablet Take 1 tablet by mouth 2 (two) times daily.     ipratropium-albuterol  (DUONEB) 0.5-2.5 (3) MG/3ML SOLN Take 3 mLs by nebulization every 6 (six) hours as needed. 300 mL 11   OXYGEN  Inhale into the lungs. 2 Liters 24/7     potassium chloride  (KLOR-CON  M) 10 MEQ tablet TAKE 1 TABLET BY MOUTH EVERY DAY 90 tablet 1   TRELEGY ELLIPTA  100-62.5-25 MCG/ACT AEPB Inhale 1 puff into the lungs daily.     acalabrutinib  maleate (CALQUENCE )  100 MG tablet Take 1 tablet (100 mg total) by mouth 2 (two) times daily. (Patient not taking: Reported on 05/20/2024) 60 tablet 2   furosemide  (LASIX ) 40 MG tablet TAKE 1 TABLET BY MOUTH EVERY DAY 90 tablet 1   HYDROcodone -acetaminophen  (NORCO/VICODIN) 5-325 MG tablet Take 1 tablet by mouth 2 (two) times daily as needed for moderate pain (pain score 4-6). 60 tablet 0   losartan  (COZAAR ) 25 MG tablet TAKE 1/2 TABLET BY MOUTH EVERY DAY (Patient not taking: Reported on 05/20/2024) 45 tablet 0   MEGARED OMEGA-3 KRILL OIL PO Take 1 tablet by mouth daily. (Patient not taking: Reported on 05/20/2024)     Multiple Vitamins-Minerals (CENTRUM SILVER 50+WOMEN) TABS Take 1 tablet by mouth daily. (Patient not taking: Reported on 05/20/2024)     ondansetron  (ZOFRAN ) 8 MG tablet ONE PILL EVERY 8 HOURS AS NEEDED FOR NAUSEA/VOMITTING. (Patient not taking: Reported on 05/20/2024) 40 tablet 1   pantoprazole  (PROTONIX ) 20 MG tablet TAKE 2 TABLETS (40 MG TOTAL) BY MOUTH EVERY MORNING. TAKE 30 MINUTES TO HOUR BEFORE BREAKFAST (Patient not taking: Reported on 05/20/2024) 180 tablet 1   rosuvastatin  (CRESTOR ) 5 MG tablet TAKE 1 TABLET (5 MG TOTAL) BY MOUTH DAILY. (Patient not taking: Reported on 05/20/2024) 90 tablet 3   No current facility-administered medications for this visit.   Facility-Administered Medications Ordered in Other Visits  Medication Dose Route Frequency Provider Last Rate Last Admin   heparin  lock flush 100 unit/mL  500 Units Intravenous Once Raymonda Pell R, MD       sodium chloride  flush (NS) 0.9 % injection 10 mL  10 mL Intravenous Once Lavenia Stumpo R, MD       sodium chloride  flush (NS) 0.9 % injection 10 mL  10 mL Intravenous Once Ziere Docken R, MD        PHYSICAL EXAMINATION: ECOG PERFORMANCE STATUS: 0 - Asymptomatic  BP 107/70 (BP Location: Left Arm, Patient Position: Sitting)   Pulse 71   Temp (!) 96.4 F (35.8 C) (Tympanic)   Ht 5' 7 (1.702 m)   Wt 199 lb (90.3  kg)   SpO2 100%   BMI 31.17 kg/m   Filed Weights   05/20/24 1301  Weight: 199 lb (90.3 kg)       Physical Exam HENT:     Head: Normocephalic and atraumatic.     Mouth/Throat:     Pharynx: No oropharyngeal exudate.  Eyes:     Pupils: Pupils are equal, round, and reactive to light.  Cardiovascular:     Rate and Rhythm: Normal rate and regular rhythm.  Pulmonary:     Effort: No respiratory distress.     Breath sounds: No wheezing.     Comments: Bilateral basilar crackles Abdominal:  General: Bowel sounds are normal. There is no distension.     Palpations: Abdomen is soft. There is no mass.     Tenderness: There is no abdominal tenderness. There is no guarding or rebound.  Musculoskeletal:        General: No tenderness. Normal range of motion.     Cervical back: Normal range of motion and neck supple.  Skin:    General: Skin is warm.  Neurological:     Mental Status: She is alert and oriented to person, place, and time.  Psychiatric:        Mood and Affect: Affect normal.    LABORATORY DATA:  I have reviewed the data as listed    Component Value Date/Time   NA 136 05/20/2024 1257   NA 145 (H) 09/08/2019 1359   NA 142 09/14/2013 1127   K 3.9 05/20/2024 1257   K 3.5 09/14/2013 1127   CL 96 (L) 05/20/2024 1257   CL 110 (H) 09/14/2013 1127   CO2 29 05/20/2024 1257   CO2 29 09/14/2013 1127   GLUCOSE 103 (H) 05/20/2024 1257   GLUCOSE 106 (H) 09/14/2013 1127   BUN 18 05/20/2024 1257   BUN 15 09/08/2019 1359   BUN 8 09/14/2013 1127   CREATININE 0.92 05/20/2024 1257   CREATININE 0.71 09/14/2013 1127   CREATININE 0.68 04/01/2013 1550   CALCIUM  10.2 05/20/2024 1257   CALCIUM  8.6 09/14/2013 1127   PROT 7.3 05/20/2024 1257   PROT 7.6 09/14/2013 1127   ALBUMIN 4.3 05/20/2024 1257   ALBUMIN 3.2 (L) 09/14/2013 1127   AST 22 05/20/2024 1257   ALT 14 05/20/2024 1257   ALT 19 09/14/2013 1127   ALKPHOS 81 05/20/2024 1257   ALKPHOS 76 09/14/2013 1127   BILITOT 0.5  05/20/2024 1257   GFRNONAA >60 05/20/2024 1257   GFRNONAA >60 09/14/2013 1127   GFRAA >60 01/20/2020 1303   GFRAA >60 09/14/2013 1127    No results found for: SPEP, UPEP  Lab Results  Component Value Date   WBC 8.7 05/20/2024   NEUTROABS 7.3 05/20/2024   HGB 14.1 05/20/2024   HCT 42.3 05/20/2024   MCV 92.4 05/20/2024   PLT 273 05/20/2024      Chemistry      Component Value Date/Time   NA 136 05/20/2024 1257   NA 145 (H) 09/08/2019 1359   NA 142 09/14/2013 1127   K 3.9 05/20/2024 1257   K 3.5 09/14/2013 1127   CL 96 (L) 05/20/2024 1257   CL 110 (H) 09/14/2013 1127   CO2 29 05/20/2024 1257   CO2 29 09/14/2013 1127   BUN 18 05/20/2024 1257   BUN 15 09/08/2019 1359   BUN 8 09/14/2013 1127   CREATININE 0.92 05/20/2024 1257   CREATININE 0.71 09/14/2013 1127   CREATININE 0.68 04/01/2013 1550      Component Value Date/Time   CALCIUM  10.2 05/20/2024 1257   CALCIUM  8.6 09/14/2013 1127   ALKPHOS 81 05/20/2024 1257   ALKPHOS 76 09/14/2013 1127   AST 22 05/20/2024 1257   ALT 14 05/20/2024 1257   ALT 19 09/14/2013 1127   BILITOT 0.5 05/20/2024 1257       RADIOGRAPHIC STUDIES: I have personally reviewed the radiological images as listed and agreed with the findings in the report. No results found.   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   # Currently on Calequence 100 mg twice a day [ AUG 1st, 2022]-    # CONTINUE TO HOLD Calequence ? Causing pancreatitis- especially given NED on recent CT scan- CAP.  Stable from Lymphoma standpoint. See below-no significant improvement even after holding Calquence  for a month.  # NOV 2025- abdominal pain- ? 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 Short-term follow-up examination is recommended in 3 months to document resolution; There is a 4 x 8 mm loculated T2 hyperintense non-aggressive lesion in the pancreatic body.  Elevated tumor markers-CA 19-9.  Recommend endoscopic ultrasound for further evaluation.  # Immunocompromised state: Hx Multifocal pneumonia-[April 2023]-Given the multiple pneumonias/high risk of death-recommended continuation of further rituximab . MARCH 2024- IgG- 800.  Monitor for now. Stable.  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   #Chronic respiratory failure -2 L home O2 -NICMP/pulmonary hypertension/chronic bilateral lung scarring-CT MARCH 2025- HRCT-  Pulmonary parenchymal pattern of fibrosis [Dr.Dgyali]-currently OFF Nintenaib poorly-  PFTs- AUg 2025-on Trelegy.   # Hx of Rheumatoid arthritis-given ILD-  s/p evaluation with  Rheumatology- stable.   # Left shoulder pain/ Chronic arthritis-on meloxicam  as needed- stable.   #Vaccinations :recommend  flu shot-   COVID booster/RSV; ? Pneumonia vaccination [check with PCP]  # DISPOSITION:  #  follow up in 1 month MD ;labs- cbc/cmp/ldh/BNP;  ca-19-9-;- -Dr.B  Addendum: Discussed with GI nurse navigator-patient preferred to [facility] for endoscopic ultrasound and the biopsy.  I have also reached out to patient's niece Tammy with the concern for elevated tumor markers and the need to perform endoscopic ultrasound and biopsy as soon as possible.  Follow-up as planned       Orders Placed This Encounter  Procedures   CBC with Differential (Cancer Center Only)    Standing Status:   Future    Expected Date:   06/17/2024    Expiration Date:   09/15/2024   CMP (Cancer Center only)    Standing  Status:   Future    Expected Date:   06/17/2024    Expiration Date:   09/15/2024   Lactate dehydrogenase    Standing Status:   Future    Expected Date:   06/17/2024    Expiration Date:   09/15/2024   Cancer antigen 19-9    Standing Status:   Future    Expected Date:   06/17/2024    Expiration Date:   09/15/2024   Pro b natriuretic peptide (BNP)    Standing Status:   Future    Expected Date:   06/17/2024    Expiration Date:   09/15/2024     All questions were answered. The patient knows to call the clinic with any problems, questions or concerns.      Cindy JONELLE Joe, MD 05/28/2024 7:09 PM

## 2024-05-20 NOTE — Telephone Encounter (Signed)
 Referral sent to [facility] for EUS to evaluate pancreas mass with elevated CA 19-9.  If patient does answer her phone-for scheduling/and other information-reach out to patient's niece-Gladys-at [phone].

## 2024-05-21 LAB — CANCER ANTIGEN 19-9: CA 19-9: 488 U/mL — ABNORMAL HIGH (ref 0–35)

## 2024-05-21 NOTE — Telephone Encounter (Signed)
 [facility] Oncology team, Due to the upcoming holidays (Thursdays are one of my Advanced Endoscopy days and an upcoming time-off in January), my earlier availability for Upper EUS will be 1/22. If you are comfortable with this, then let Patty and I know and we will work on arranging this with patient. Otherwise, let us  know if you are going to refer outside. GM

## 2024-05-22 LAB — IMMUNOGLOBULINS A/E/G/M, SERUM
IgA: 111 mg/dL (ref 64–422)
IgE (Immunoglobulin E), Serum: 2 [IU]/mL — ABNORMAL LOW (ref 6–495)
IgG (Immunoglobin G), Serum: 796 mg/dL (ref 586–1602)
IgM (Immunoglobulin M), Srm: 16 mg/dL — ABNORMAL LOW (ref 26–217)

## 2024-05-23 NOTE — Telephone Encounter (Signed)
 Referral faxed to [facility]. Called pt and made aware.

## 2024-05-24 ENCOUNTER — Other Ambulatory Visit: Payer: Self-pay | Admitting: Internal Medicine

## 2024-05-24 NOTE — Telephone Encounter (Signed)
Got it. Thanks. GM 

## 2024-05-25 NOTE — Progress Notes (Signed)
 I tried to reach the patient's niece Tammy -left a voicemail reminding patient to follow-up with endoscopic ultrasound as recommended for further workup of the pancreatic lesions.   GB

## 2024-05-28 ENCOUNTER — Encounter: Payer: Self-pay | Admitting: Internal Medicine

## 2024-05-31 NOTE — Telephone Encounter (Signed)
 Pt scheduled for EUS at [facility] on 12/31.

## 2024-06-06 ENCOUNTER — Inpatient Hospital Stay: Attending: Internal Medicine

## 2024-06-06 ENCOUNTER — Inpatient Hospital Stay: Admitting: Internal Medicine

## 2024-06-07 ENCOUNTER — Inpatient Hospital Stay: Attending: Internal Medicine

## 2024-06-07 ENCOUNTER — Inpatient Hospital Stay: Admitting: Internal Medicine

## 2024-06-07 ENCOUNTER — Other Ambulatory Visit: Payer: Self-pay | Admitting: Internal Medicine

## 2024-06-07 ENCOUNTER — Encounter: Payer: Self-pay | Admitting: Internal Medicine

## 2024-06-07 VITALS — BP 91/69 | HR 149 | Temp 97.3°F | Ht 67.0 in | Wt 198.0 lb

## 2024-06-07 DIAGNOSIS — Z79899 Other long term (current) drug therapy: Secondary | ICD-10-CM | POA: Diagnosis not present

## 2024-06-07 DIAGNOSIS — I5022 Chronic systolic (congestive) heart failure: Secondary | ICD-10-CM

## 2024-06-07 DIAGNOSIS — C250 Malignant neoplasm of head of pancreas: Secondary | ICD-10-CM | POA: Diagnosis not present

## 2024-06-07 DIAGNOSIS — C8311 Mantle cell lymphoma, lymph nodes of head, face, and neck: Secondary | ICD-10-CM | POA: Diagnosis not present

## 2024-06-07 DIAGNOSIS — Z5111 Encounter for antineoplastic chemotherapy: Secondary | ICD-10-CM | POA: Diagnosis present

## 2024-06-07 DIAGNOSIS — Z5189 Encounter for other specified aftercare: Secondary | ICD-10-CM | POA: Insufficient documentation

## 2024-06-07 DIAGNOSIS — K8689 Other specified diseases of pancreas: Secondary | ICD-10-CM | POA: Diagnosis not present

## 2024-06-07 LAB — CMP (CANCER CENTER ONLY)
ALT: 12 U/L (ref 0–44)
AST: 22 U/L (ref 15–41)
Albumin: 4 g/dL (ref 3.5–5.0)
Alkaline Phosphatase: 81 U/L (ref 38–126)
Anion gap: 13 (ref 5–15)
BUN: 13 mg/dL (ref 8–23)
CO2: 28 mmol/L (ref 22–32)
Calcium: 10.1 mg/dL (ref 8.9–10.3)
Chloride: 93 mmol/L — ABNORMAL LOW (ref 98–111)
Creatinine: 0.9 mg/dL (ref 0.44–1.00)
GFR, Estimated: >60 mL/min
Glucose, Bld: 105 mg/dL — ABNORMAL HIGH (ref 70–99)
Potassium: 3.6 mmol/L (ref 3.5–5.1)
Sodium: 134 mmol/L — ABNORMAL LOW (ref 135–145)
Total Bilirubin: 0.5 mg/dL (ref 0.0–1.2)
Total Protein: 7.2 g/dL (ref 6.5–8.1)

## 2024-06-07 LAB — CBC WITH DIFFERENTIAL (CANCER CENTER ONLY)
Abs Immature Granulocytes: 0.07 K/uL (ref 0.00–0.07)
Basophils Absolute: 0 K/uL (ref 0.0–0.1)
Basophils Relative: 0 %
Eosinophils Absolute: 0.1 K/uL (ref 0.0–0.5)
Eosinophils Relative: 1 %
HCT: 41 % (ref 36.0–46.0)
Hemoglobin: 13.7 g/dL (ref 12.0–15.0)
Immature Granulocytes: 1 %
Lymphocytes Relative: 13 %
Lymphs Abs: 1.3 K/uL (ref 0.7–4.0)
MCH: 30.6 pg (ref 26.0–34.0)
MCHC: 33.4 g/dL (ref 30.0–36.0)
MCV: 91.5 fL (ref 80.0–100.0)
Monocytes Absolute: 0.6 K/uL (ref 0.1–1.0)
Monocytes Relative: 6 %
Neutro Abs: 7.7 K/uL (ref 1.7–7.7)
Neutrophils Relative %: 79 %
Platelet Count: 261 K/uL (ref 150–400)
RBC: 4.48 MIL/uL (ref 3.87–5.11)
RDW: 12.9 % (ref 11.5–15.5)
WBC Count: 9.8 K/uL (ref 4.0–10.5)
nRBC: 0 % (ref 0.0–0.2)

## 2024-06-07 LAB — LACTATE DEHYDROGENASE: LDH: 195 U/L (ref 105–235)

## 2024-06-07 LAB — PRO BRAIN NATRIURETIC PEPTIDE: Pro Brain Natriuretic Peptide: 209 pg/mL

## 2024-06-07 NOTE — Progress Notes (Signed)
 [facility] OFFICE PROGRESS NOTE  Patient Care Team: Dineen Rollene MATSU, FNP as PCP - General (Family Medicine) End, Lonni, MD as PCP - Cardiology (Cardiology) Fernande Elspeth BROCKS, MD (Inactive) as PCP - Electrophysiology (Cardiology) [name], MD (Internal Medicine) Rennie Cindy SAUNDERS, MD as Consulting Physician (Oncology)   Cancer Staging  Primary cancer of head of pancreas [facility]) Staging form: Exocrine Pancreas, AJCC 8th Edition - Clinical: Stage IB (cT2, cN0, cM0) - Signed by Rennie Cindy SAUNDERS, MD on 06/07/2024 Total positive nodes: 0    Oncology History Overview Note  # JAN 2017- MANTLE CELL LYMPHOMA STAGE IV; [R Breast LN US  Core Bx-1.2cm LN/R Ax LN-Bx]; cyclin D Pos; Mitotic rate-LOW; MIPI score [5/intermediate risk]; BMBx-Positive for involvement. Feb 9th- START Benda-Ritux with neulasta ; Prolonged neutropenia; DISCONT- Benda-Ritux;   # April 13 th 2017- START R-CHOP x1; severe/prolonged neutropenia; PET- CR; BMBx-Neg; Disc R-CHOP # June 2022- DIAGNOSIS: thickened cortex of 12 mm. An additional lymph node demonstrates a thickened cortex of 7 mm. A third lymph node demonstrates a mildly thickened cortex of 4 mm A. LYMPH NODE, LEFT AXILLA; ULTRASOUND-GUIDED BIOPSY:  - CD5+ MONOCLONAL B-CELL POPULATION; COMPATIBLE WITH INVOLVEMENT BY THE  PATIENT'S KNOWN MANTLE CELL LYMPHOMA.   #July 2022-second week-started ibrutinib  420 mg a dayx 1 week-stop because of severe rash.  Significant clinical response noted.  # # 26th MAY 2017- Start Rituxan  q 35M Main OCT 12th 2017- PET NED.    # AUG 1st, 2022- start acalbrutinib. 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, face, and neck (HCC)  02/22/2021 - 07/12/2021 Chemotherapy   Patient is on Treatment Plan : Rituximab  q 4 W     06/15/2024 -  Chemotherapy   Patient is on Treatment Plan : PANCREATIC Abraxane D1,8,15 + Gemcitabine D1,8,15 q28d     Primary cancer of head of pancreas (HCC)  06/07/2024 Initial Diagnosis   Primary cancer of head of pancreas (HCC)   06/07/2024 Cancer Staging   Staging form: Exocrine Pancreas, AJCC 8th Edition - Clinical: Stage IB (cT2, cN0, cM0) - Signed by Rennie Cindy SAUNDERS, MD on 06/07/2024 Total positive nodes: 0   06/15/2024 -  Chemotherapy   Patient is on Treatment Plan : PANCREATIC Abraxane D1,8,15 + Gemcitabine D1,8,15 q28d      INTERVAL HISTORY: Accompanied by her brother ambulating wheelchair.  Michelle [name] 84 y.o.  female pleasant patient multiple medical problems-ischemic cardiomyopathy; pulm hypertension; Hx of PE OFF eliquis  and above history of recurrent mantle cell lymphoma on Calquence - on HOLD sec to abdominal pain; here for follow-up.  Discussed the use of AI scribe software for clinical note transcription with the patient, who gave verbal consent to proceed.  History of Present Illness   Michelle [name] is an 84 year old female with stage I pancreatic head adenocarcinoma and mantle cell lymphoma in remission who presents to review recent diagnostic workup and discuss treatment planning for pancreatic cancer.  She was recently diagnosed with localized pancreatic head adenocarcinoma following endoscopic ultrasound and biopsy, which confirmed adenocarcinoma. The lesion measures approximately 2 cm and is associated with an elevated CA  19-9 of 488.  She experiences significant pancreatic pain, managed with opioid analgesics. The pain is responsive to medication, and she takes opioids preemptively at pain onset, with relief following dosing. She  also reports opioid-induced constipation and is considering starting Miralax . She denies current nausea, as Zofran  is effective.  Her mantle cell lymphoma remains in remission. She previously underwent chemotherapy nearly ten years ago, complicated by significant cytopenias and illness, and her chemotherapy port was removed. She has not received chemotherapy since.  Comorbidities include chronic systolic heart failure, chronic lung disease with prior pneumonias, and arthritis. She previously required home oxygen  for pulmonary issues and continues to have oxygen  available at home, though she is not currently using it. She has difficulty with blood draws from one arm.      Review of Systems  Constitutional:  Negative for chills, diaphoresis, fever, malaise/fatigue and weight loss.  HENT:  Negative for nosebleeds and sore throat.   Eyes:  Negative for double vision.  Respiratory:  Negative for hemoptysis, sputum production and wheezing.   Cardiovascular:  Negative for chest pain, palpitations, orthopnea and leg swelling.  Gastrointestinal:  Positive for constipation. Negative for abdominal pain, blood in stool, diarrhea, heartburn, melena, nausea and vomiting.  Genitourinary:  Negative for dysuria, frequency and urgency.  Musculoskeletal:  Positive for back pain and joint pain.  Skin: Negative.  Negative for itching and rash.  Neurological:  Negative for dizziness, tingling, focal weakness, weakness and headaches.  Endo/Heme/Allergies:  Does not bruise/bleed easily.  Psychiatric/Behavioral:  Negative for depression. The patient is not nervous/anxious and does not have insomnia.     PAST MEDICAL HISTORY :  Past Medical History:  Diagnosis Date   Arthritis    Collagen vascular disease    GERD (gastroesophageal reflux disease)    Headache(784.0)    HFrEF (heart failure with reduced ejection fraction) (HCC)    Nonischemic cardiomyopathy, LVEF as low as 30-35% in 08/2019   History of  methotrexate  therapy    Hyperlipidemia    hx   Lymphadenopathy of head and neck 01/2015   see on Thyroid  ultrasound   Lymphoma, mantle cell (HCC) 06/01/2015   bx of lymph node in right breast/Stage IV Mantle Cell Lymphoma   Multifocal pneumonia 10/05/2021   Personal history of chemotherapy    Pulmonary embolism (HCC) 10/05/2021   acute   Respiratory failure (HCC) 10/05/2021   acute   Rheumatoid arthritis (HCC)     PAST SURGICAL HISTORY :   Past Surgical History:  Procedure Laterality Date   BREAST BIOPSY Left 11/07/2020   u/s bx-hydromark #3 coil-path pending   CARDIAC CATHETERIZATION  09/2004   [facility]; EF 60%   CARDIAC CATHETERIZATION  08/2004   [facility]   IR FLUORO GUIDED NEEDLE PLC ASPIRATION/INJECTION LOC  06/18/2018   PERIPHERAL VASCULAR CATHETERIZATION N/A 07/04/2015   Procedure: Pat Cath Insertion;  Surgeon: Selinda GORMAN Gu, MD;  Location: [facility] INVASIVE CV LAB;  Service: Cardiovascular;  Laterality: N/A;   PORTA CATH REMOVAL N/A 06/23/2018   Procedure: PORTA CATH REMOVAL;  Surgeon: Gu Selinda GORMAN, MD;  Location: [facility] INVASIVE CV LAB;  Service: Cardiovascular;  Laterality: N/A;   RIGHT/LEFT HEART CATH AND CORONARY ANGIOGRAPHY Bilateral 09/20/2019   Procedure: RIGHT/LEFT HEART CATH AND CORONARY ANGIOGRAPHY;  Surgeon: Mady Bruckner, MD;  Location: [facility] INVASIVE CV LAB;  Service: Cardiovascular;  Laterality: Bilateral;    FAMILY HISTORY :   Family History  Problem Relation Age of Onset   Diabetes Mother    Cholelithiasis Mother  Hypertension Sister    Diabetes Sister    Heart murmur Sister    Arthritis Brother    Breast cancer Neg Hx     SOCIAL HISTORY:   Social History   Tobacco Use   Smoking status: Never   Smokeless tobacco: Never  Vaping Use   Vaping status: Never Used  Substance Use Topics   Alcohol use: No   Drug use: No    ALLERGIES:  has no known allergies.  MEDICATIONS:  Current Outpatient Medications  Medication Sig Dispense Refill   feeding  supplement (ENSURE ENLIVE / ENSURE PLUS) LIQD Take 237 mLs by mouth 3 (three) times daily between meals. 237 mL 12   furosemide  (LASIX ) 40 MG tablet TAKE 1 TABLET BY MOUTH EVERY DAY 90 tablet 1   HYDROcodone -acetaminophen  (NORCO/VICODIN) 5-325 MG tablet Take 1 tablet by mouth 2 (two) times daily as needed for moderate pain (pain score 4-6). 60 tablet 0   ipratropium-albuterol  (DUONEB) 0.5-2.5 (3) MG/3ML SOLN Take 3 mLs by nebulization every 6 (six) hours as needed. 300 mL 11   pantoprazole  (PROTONIX ) 20 MG tablet TAKE 2 TABLETS (40 MG TOTAL) BY MOUTH EVERY MORNING. TAKE 30 MINUTES TO HOUR BEFORE BREAKFAST 180 tablet 1   potassium chloride  (KLOR-CON  M) 10 MEQ tablet TAKE 1 TABLET BY MOUTH EVERY DAY 90 tablet 1   TRELEGY ELLIPTA  100-62.5-25 MCG/ACT AEPB Inhale 1 puff into the lungs daily.     acalabrutinib  maleate (CALQUENCE ) 100 MG tablet Take 1 tablet (100 mg total) by mouth 2 (two) times daily. (Patient not taking: Reported on 06/07/2024) 60 tablet 2   carvedilol  (COREG ) 3.125 MG tablet TAKE 1 TABLET BY MOUTH TWICE A DAY (Patient not taking: Reported on 06/07/2024) 180 tablet 3   hydroxychloroquine  (PLAQUENIL ) 200 MG tablet Take 1 tablet by mouth 2 (two) times daily. (Patient not taking: Reported on 06/07/2024)     losartan  (COZAAR ) 25 MG tablet TAKE 1/2 TABLET BY MOUTH EVERY DAY (Patient not taking: Reported on 06/07/2024) 45 tablet 0   MEGARED OMEGA-3 KRILL OIL PO Take 1 tablet by mouth daily. (Patient not taking: Reported on 06/07/2024)     Multiple Vitamins-Minerals (CENTRUM SILVER 50+WOMEN) TABS Take 1 tablet by mouth daily. (Patient not taking: Reported on 06/07/2024)     ondansetron  (ZOFRAN ) 8 MG tablet ONE PILL EVERY 8 HOURS AS NEEDED FOR NAUSEA/VOMITTING. (Patient not taking: Reported on 06/07/2024) 40 tablet 1   OXYGEN  Inhale into the lungs. 2 Liters 24/7     rosuvastatin  (CRESTOR ) 5 MG tablet TAKE 1 TABLET (5 MG TOTAL) BY MOUTH DAILY. (Patient not taking: Reported on 06/07/2024) 90 tablet 3   No current  facility-administered medications for this visit.   Facility-Administered Medications Ordered in Other Visits  Medication Dose Route Frequency Provider Last Rate Last Admin   heparin  lock flush 100 unit/mL  500 Units Intravenous Once Jhaden Pizzuto R, MD       sodium chloride  flush (NS) 0.9 % injection 10 mL  10 mL Intravenous Once Mercedes Fort R, MD       sodium chloride  flush (NS) 0.9 % injection 10 mL  10 mL Intravenous Once Amatullah Christy R, MD        PHYSICAL EXAMINATION: ECOG PERFORMANCE STATUS: 0 - Asymptomatic  BP 91/69 (BP Location: Left Arm, Patient Position: Sitting)   Pulse (!) 149   Temp (!) 97.3 F (36.3 C) (Tympanic)   Ht 5' 7 (1.702 m)   Wt 198 lb (89.8 kg)   SpO2 98%  BMI 31.01 kg/m   Filed Weights   06/07/24 1508  Weight: 198 lb (89.8 kg)        Physical Exam HENT:     Head: Normocephalic and atraumatic.     Mouth/Throat:     Pharynx: No oropharyngeal exudate.  Eyes:     Pupils: Pupils are equal, round, and reactive to light.  Cardiovascular:     Rate and Rhythm: Normal rate and regular rhythm.  Pulmonary:     Effort: No respiratory distress.     Breath sounds: No wheezing.     Comments: Bilateral basilar crackles Abdominal:     General: Bowel sounds are normal. There is no distension.     Palpations: Abdomen is soft. There is no mass.     Tenderness: There is no abdominal tenderness. There is no guarding or rebound.  Musculoskeletal:        General: No tenderness. Normal range of motion.     Cervical back: Normal range of motion and neck supple.  Skin:    General: Skin is warm.  Neurological:     Mental Status: She is alert and oriented to person, place, and time.  Psychiatric:        Mood and Affect: Affect normal.    LABORATORY DATA:  I have reviewed the data as listed    Component Value Date/Time   NA 134 (L) 06/07/2024 1502   NA 145 (H) 09/08/2019 1359   NA 142 09/14/2013 1127   K 3.6 06/07/2024 1502   K 3.5  09/14/2013 1127   CL 93 (L) 06/07/2024 1502   CL 110 (H) 09/14/2013 1127   CO2 28 06/07/2024 1502   CO2 29 09/14/2013 1127   GLUCOSE 105 (H) 06/07/2024 1502   GLUCOSE 106 (H) 09/14/2013 1127   BUN 13 06/07/2024 1502   BUN 15 09/08/2019 1359   BUN 8 09/14/2013 1127   CREATININE 0.90 06/07/2024 1502   CREATININE 0.71 09/14/2013 1127   CREATININE 0.68 04/01/2013 1550   CALCIUM  10.1 06/07/2024 1502   CALCIUM  8.6 09/14/2013 1127   PROT 7.2 06/07/2024 1502   PROT 7.6 09/14/2013 1127   ALBUMIN 4.0 06/07/2024 1502   ALBUMIN 3.2 (L) 09/14/2013 1127   AST 22 06/07/2024 1502   ALT 12 06/07/2024 1502   ALT 19 09/14/2013 1127   ALKPHOS 81 06/07/2024 1502   ALKPHOS 76 09/14/2013 1127   BILITOT 0.5 06/07/2024 1502   GFRNONAA >60 06/07/2024 1502   GFRNONAA >60 09/14/2013 1127   GFRAA >60 01/20/2020 1303   GFRAA >60 09/14/2013 1127    No results found for: SPEP, UPEP  Lab Results  Component Value Date   WBC 9.8 06/07/2024   NEUTROABS 7.7 06/07/2024   HGB 13.7 06/07/2024   HCT 41.0 06/07/2024   MCV 91.5 06/07/2024   PLT 261 06/07/2024      Chemistry      Component Value Date/Time   NA 134 (L) 06/07/2024 1502   NA 145 (H) 09/08/2019 1359   NA 142 09/14/2013 1127   K 3.6 06/07/2024 1502   K 3.5 09/14/2013 1127   CL 93 (L) 06/07/2024 1502   CL 110 (H) 09/14/2013 1127   CO2 28 06/07/2024 1502   CO2 29 09/14/2013 1127   BUN 13 06/07/2024 1502   BUN 15 09/08/2019 1359   BUN 8 09/14/2013 1127   CREATININE 0.90 06/07/2024 1502   CREATININE 0.71 09/14/2013 1127   CREATININE 0.68 04/01/2013 1550      Component  Value Date/Time   CALCIUM  10.1 06/07/2024 1502   CALCIUM  8.6 09/14/2013 1127   ALKPHOS 81 06/07/2024 1502   ALKPHOS 76 09/14/2013 1127   AST 22 06/07/2024 1502   ALT 12 06/07/2024 1502   ALT 19 09/14/2013 1127   BILITOT 0.5 06/07/2024 1502       RADIOGRAPHIC STUDIES: I have personally reviewed the radiological images as listed and agreed with the findings in  the report. No results found.   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  NOV-DEC 2025- worsening abdominal pain-  ill-defined T1 hypointense approximately 2.0 x 2.7 cm (series 18, image 55), lesion centered in the uncinate process/pancreatic head#  # NOV 2025- abdominal pain- ? 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 Short-term follow-up examination is recommended in 3 months to document resolution; There is a 4 x 8 mm loculated T2 hyperintense non-aggressive lesion in the pancreatic body.  Elevated tumor markers-CA 19-9.  Plan checking NGS if tissue available.  Will also review at the tumor conference.  # I had a long discussion with the patient and her niece regarding the serious diagnosis of pancreatic cancer.  Patient given her multiple medical problems is a borderline candidate at best for surgery.  However will refer to surgery for further recommendations.  # Discussed options of systemic chemotherapy plus minus radiation. # Discussed multiple chemo options-FOLFIRINOX/gemcitabine Abraxane.  Discussed pros and cons of each therapy in detail.  FOLFIRINOX his response rates of about 30-35%; whereas gemcitabine Abraxane response rates around 20-25%.  However FOLFIRINOX has higher risk of neutropenia fatigue compared to gemcitabine Abraxane.  Gem Abraxane high incidence of neuropathy.  Patient not a candidate for FOLFIRINOX chemotherapy.  Will plan gemcitabine Abraxane every 2 weeks with appropriate dose reductions given patient's performance status.  Patient will need chemotherapy education.  Patient also get growth factor support on due to chemotherapy.  # History of severe neutropenia to chemotherapy- [prior  Bendamustine  rituximab ]-this will need to be considered on chemotherapy again.  # [id number]- Mantle cell lymphoma recurrent biopsy-proven [July 2022]; Currently  Calquence  on HOLD sec to recent diagnosis of pancreatic cancer. PET scan JUNE 23rd, 2025- No findings for recurrent lymphoma.   # Currently on Calequence 100 mg twice a day [ AUG 1st, 2022]-  on HOLD given new diagnosis of pancreatic cancer.  # Immunocompromised state: Hx Multifocal pneumonia-[April 2023]-Given the multiple pneumonias/high risk of death-recommended continuation of further rituximab . MARCH 2024- IgG- 800.  Due to monitor closely while on chemotherapy.  # Abdominal pain-likely secondary pancreatic cancer.  Possible involvement of celiac plexus causing the abdominal pain-consider celiac plexus block.  Will discuss with IR/review conference.  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   # Hx of Rheumatoid arthritis-given ILD-  s/p evaluation with  Rheumatology- stable.   # Left shoulder pain/ Chronic arthritis-on meloxicam  as needed- stable.   #Vaccinations :recommend  flu  shot-   COVID booster/RSV; ? Pneumonia vaccination [check with PCP]  # Chemotherapy education/nutrition evaluation: port placement. Plan start chemotherapy Sent the scripts for antiemetics-Zofran  and Compazine ;   # PIV:   # ACP- done. # Prognosis I do long discussion regarding overall poor prognosis of the pancreatic cancer-however at this time patient is willing to consider treatment options-understanding these are likely  palliative measures-unless patient has definitive surgery.  Will also make a referral to radiation.   # DISPOSITION:  # referral to [facility] hepatic biliary surgery re: pancreatic cancer # referral to Dr.Chrystal re: pancreatic cancer # # refer to Chemo education ASAP- re: chemo # refer to Nutrition/speech path re: cancer/concern for malnutrition  # referral to [employer] re: pain control/next MD visit  # as per [facility]- Dr.B  # 40 minutes face-to-face with the patient discussing the above plan of care; more than 50% of time spent on prognosis/ natural history; counseling and coordination.        Orders Placed This Encounter  Procedures   Pro Brain natriuretic peptide    Standing Status:   Future    Number of Occurrences:   1    Expected Date:   06/07/2024    Expiration Date:   06/07/2025   CBC with Differential (Cancer Center Only)    Standing Status:   Future    Expected Date:   06/15/2024    Expiration Date:   06/15/2025   CMP (Cancer Center only)    Standing Status:   Future    Expected Date:   06/15/2024    Expiration Date:   06/15/2025   CBC with Differential (Cancer Center Only)    Standing Status:   Future    Expected Date:   06/29/2024    Expiration Date:   06/29/2025   CMP (Cancer Center only)    Standing Status:   Future    Expected Date:   06/29/2024    Expiration Date:   06/29/2025   Ambulatory referral to Radiation Oncology    Referral Priority:   Routine    Referral Type:   Consultation    Referral Reason:   Specialty Services Required    Requested Specialty:   Radiation Oncology    Number of Visits Requested:   1   Ambulatory Referral to [facility]    Referral Priority:   Routine    Referral Type:   Consultation    Referral Reason:   Specialty Services Required    Number of Visits Requested:   1   Ambulatory Referral to Palliative Care    Referral Priority:   Routine    Referral Type:   Consultation    Referred to Provider:   Sherleen Fonda SAUNDERS, NP    Number of  Visits Requested:   1     All questions were answered. The patient knows to call the clinic with any problems, questions or concerns.      Cindy SAUNDERS Joe, MD 06/07/2024 7:16 PM

## 2024-06-07 NOTE — Progress Notes (Signed)
 START ON PATHWAY REGIMEN - Pancreatic Adenocarcinoma     A cycle is every 28 days:     Nab-paclitaxel (protein bound)      Gemcitabine   **Always confirm dose/schedule in your pharmacy ordering system**  Patient Characteristics: Preoperative, M0 (Clinical Staging), Borderline Resectable, PS ? 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

## 2024-06-07 NOTE — Assessment & Plan Note (Signed)
 Michelle [name]

## 2024-06-07 NOTE — Progress Notes (Signed)
 Patient states she would like to know the results of her biopsy. Reports constipation since biopsy preformed along with stomach pain today, 8/10 pain scale.

## 2024-06-07 NOTE — Assessment & Plan Note (Addendum)
 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  NOV-DEC 2025- worsening abdominal pain-  ill-defined T1 hypointense approximately 2.0 x 2.7 cm (series 18, image 55), lesion centered in the uncinate process/pancreatic head#  # NOV 2025- abdominal pain- ? 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 Short-term follow-up examination is recommended in 3 months to document resolution; There is a 4 x 8 mm loculated T2 hyperintense non-aggressive lesion in the pancreatic body.  Elevated tumor markers-CA 19-9.  Plan checking NGS if tissue available.  Will also review at the tumor conference.  # I had a long discussion with the patient and her niece regarding the serious diagnosis of pancreatic cancer.  Patient given her multiple medical problems is a borderline candidate at best for surgery.  However will refer to surgery for further recommendations.  # Discussed options of systemic chemotherapy plus minus radiation. # Discussed multiple chemo options-FOLFIRINOX/gemcitabine Abraxane.  Discussed pros and cons of each therapy in detail.  FOLFIRINOX his response rates of about 30-35%; whereas gemcitabine Abraxane response rates around 20-25%.  However FOLFIRINOX has higher risk of neutropenia fatigue compared to gemcitabine Abraxane.  Gem Abraxane high incidence of neuropathy.  Patient not a candidate for FOLFIRINOX chemotherapy.  Will plan gemcitabine Abraxane every 2 weeks with appropriate dose reductions given patient's performance status.  Patient will need chemotherapy education.  Patient also get growth factor support on due to chemotherapy.  # History of severe neutropenia to chemotherapy- [prior Bendamustine  rituximab ]-this will need to be considered on chemotherapy again.  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 PET scan JUNE 23rd, 2025- No findings for recurrent lymphoma.   # Currently on Calequence 100 mg twice a day [ AUG 1st, 2022]-  on HOLD given new diagnosis of pancreatic cancer.  # Immunocompromised state: Hx Multifocal pneumonia-[April 2023]-Given the multiple pneumonias/high risk of death-recommended continuation of further rituximab . MARCH 2024- IgG- 800.  Due to monitor closely while on chemotherapy.  # Abdominal pain-likely secondary pancreatic cancer.  Possible involvement of celiac plexus causing the abdominal pain-consider celiac plexus block.  Will discuss with IR/review conference.  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   # Hx of Rheumatoid arthritis-given ILD-  s/p evaluation with  Rheumatology- stable.   # Left shoulder pain/ Chronic arthritis-on meloxicam  as needed- stable.   #Vaccinations :recommend  flu shot-   COVID booster/RSV; ? Pneumonia vaccination [check with PCP]  # Chemotherapy education/nutrition evaluation: port placement. Plan start chemotherapy Sent the scripts for antiemetics-Zofran  and Compazine ;   # PIV:   # ACP- done. # Prognosis I do long discussion regarding overall poor prognosis of the pancreatic cancer-however at this time patient is willing to consider treatment options-understanding these are likely palliative measures-unless patient has definitive surgery.  Will also make a referral to radiation.   # DISPOSITION:  # referral to [facility] hepatic biliary surgery re: pancreatic cancer #  referral to Dr.Chrystal re: pancreatic cancer # # refer to Chemo education  ASAP- re: chemo # refer to Nutrition/speech path re: cancer/concern for malnutrition  # referral to [employer] re: pain control/next MD visit  # as per IS- Dr.B  # 40 minutes face-to-face with the patient discussing the above plan of care; more than 50% of time spent on prognosis/ natural history; counseling and coordination.

## 2024-06-08 ENCOUNTER — Inpatient Hospital Stay: Admitting: Internal Medicine

## 2024-06-08 ENCOUNTER — Inpatient Hospital Stay

## 2024-06-08 ENCOUNTER — Other Ambulatory Visit: Payer: Self-pay

## 2024-06-08 ENCOUNTER — Other Ambulatory Visit: Payer: Self-pay | Admitting: *Deleted

## 2024-06-08 ENCOUNTER — Telehealth: Payer: Self-pay

## 2024-06-08 DIAGNOSIS — C250 Malignant neoplasm of head of pancreas: Secondary | ICD-10-CM

## 2024-06-08 LAB — CANCER ANTIGEN 19-9: CA 19-9: 839 U/mL — ABNORMAL HIGH (ref 0–35)

## 2024-06-08 MED ORDER — HYDROCODONE-ACETAMINOPHEN 5-325 MG PO TABS: 1.0000 | ORAL_TABLET | Freq: Three times a day (TID) | ORAL | 0 refills | Status: DC | PRN

## 2024-06-08 MED ORDER — ONDANSETRON HCL 8 MG PO TABS: ORAL_TABLET | ORAL | 1 refills | Status: DC

## 2024-06-08 MED ORDER — PROCHLORPERAZINE MALEATE 10 MG PO TABS: 10.0000 mg | ORAL_TABLET | Freq: Four times a day (QID) | ORAL | 0 refills | Status: DC | PRN

## 2024-06-08 NOTE — Telephone Encounter (Signed)
 Caller verified using pt's full name and dob prior to discussing PHI   Patient's niece called to confirm that patient no longer will be taking Calquence  given new pancreatic cancer treatment. I reviewed Dr. Damaris note, I explained that the Calquence  is currently on hold.   Patient is also requesting a RF on norco. She does not have any zofran  or compazine . Scripts pended for Dr. B to sign.

## 2024-06-08 NOTE — Telephone Encounter (Signed)
 Referral sent to [facility] hepat[facility]reat[facility] ([facility]) surgeon. [facility] NGS ordered on specimen [id number], pancreas, collected 06/01/24.

## 2024-06-09 ENCOUNTER — Inpatient Hospital Stay

## 2024-06-09 ENCOUNTER — Other Ambulatory Visit: Payer: Self-pay

## 2024-06-09 ENCOUNTER — Encounter: Payer: Self-pay | Admitting: Hospice and Palliative Medicine

## 2024-06-09 ENCOUNTER — Inpatient Hospital Stay (HOSPITAL_BASED_OUTPATIENT_CLINIC_OR_DEPARTMENT_OTHER): Admitting: Hospice and Palliative Medicine

## 2024-06-09 VITALS — BP 103/71 | HR 97 | Temp 97.4°F | Ht 67.0 in | Wt 201.0 lb

## 2024-06-09 DIAGNOSIS — G893 Neoplasm related pain (acute) (chronic): Secondary | ICD-10-CM

## 2024-06-09 DIAGNOSIS — Z515 Encounter for palliative care: Secondary | ICD-10-CM | POA: Diagnosis not present

## 2024-06-09 DIAGNOSIS — C250 Malignant neoplasm of head of pancreas: Secondary | ICD-10-CM

## 2024-06-09 DIAGNOSIS — Z5111 Encounter for antineoplastic chemotherapy: Secondary | ICD-10-CM | POA: Diagnosis not present

## 2024-06-09 LAB — MISCELLANEOUS TEST

## 2024-06-09 MED ORDER — HYDROCODONE-ACETAMINOPHEN 5-325 MG PO TABS: 1.0000 | ORAL_TABLET | Freq: Four times a day (QID) | ORAL | 0 refills | Status: AC | PRN

## 2024-06-09 NOTE — Progress Notes (Signed)
 Pharmacist Chemotherapy Monitoring - Initial Assessment    Anticipated start date: 06/16/24   The following has been reviewed per standard work regarding the patient's treatment regimen: The patient's diagnosis, treatment plan and drug doses, and organ/hematologic function Lab orders and baseline tests specific to treatment regimen  The treatment plan start date, drug sequencing, and pre-medications Prior authorization status  Patient's documented medication list, including drug-drug interaction screen and prescriptions for anti-emetics and supportive care specific to the treatment regimen The drug concentrations, fluid compatibility, administration routes, and timing of the medications to be used The patient's access for treatment and lifetime cumulative dose history, if applicable  The patient's medication allergies and previous infusion related reactions, if applicable  Primary cancer of head of pancreas [facility])  Patient not a candidate for FOLFIRINOX  Will plan gemcitabine  Abraxane  every 2 weeks  Changes made to treatment plan:  N/A  Follow up needed:  Pending authorization for treatment    Michelle [name], RPH, 06/09/2024  2:24 PM

## 2024-06-09 NOTE — Progress Notes (Signed)
 "    Palliative Medicine [facility] Telephone:([phone] [phone]   Name: Michelle [name] Date: 06/09/2024 MRN: [id number]  DOB: 1940/10/03  Patient Care Team: Dineen Rollene MATSU, FNP as PCP - General (Family Medicine) End, Lonni, MD as PCP - Cardiology (Cardiology) Fernande Elspeth BROCKS, MD (Inactive) as PCP - Electrophysiology (Cardiology) [name], MD (Internal Medicine) Rennie Cindy SAUNDERS, MD as Consulting Physician (Oncology)    REASON FOR CONSULTATION: Michelle [name] is a 84 y.o. female with multiple medical problems including chronic respiratory failure on 2 L O2, nonischemic cardiomyopathy, mantle cell lymphoma, and pancreatic cancer, felt to be a borderline candidate for surgery.  Palliative care consulted to address goals of manage ongoing symptoms.  SOCIAL HISTORY:     reports that she has never smoked. She has never used smokeless tobacco. She reports that she does not drink alcohol and does not use drugs.  Patient is widowed.  Lives at home with her sister.  Has 2 nieces who are involved in her direct healthcare.  Patient says that she had legally adopted 3 sons but they are less involved.  Patient previously worked as a hospital doctor for [employer].   ADVANCE DIRECTIVES:  Does not have  CODE STATUS:   PAST MEDICAL HISTORY: Past Medical History:  Diagnosis Date   Arthritis    Collagen vascular disease    GERD (gastroesophageal reflux disease)    Headache(784.0)    HFrEF (heart failure with reduced ejection fraction) (HCC)    Nonischemic cardiomyopathy, LVEF as low as 30-35% in 08/2019   History of methotrexate  therapy    Hyperlipidemia    hx   Lymphadenopathy of head and neck 01/2015   see on Thyroid  ultrasound   Lymphoma, mantle cell (HCC) 06/01/2015   bx of lymph node in right breast/Stage IV Mantle Cell Lymphoma   Multifocal pneumonia 10/05/2021   Personal history of chemotherapy    Pulmonary embolism  (HCC) 10/05/2021   acute   Respiratory failure (HCC) 10/05/2021   acute   Rheumatoid arthritis (HCC)     PAST SURGICAL HISTORY:  Past Surgical History:  Procedure Laterality Date   BREAST BIOPSY Left 11/07/2020   u/s bx-hydromark #3 coil-path pending   CARDIAC CATHETERIZATION  09/2004   [facility]; EF 60%   CARDIAC CATHETERIZATION  08/2004   [facility]   IR FLUORO GUIDED NEEDLE PLC ASPIRATION/INJECTION LOC  06/18/2018   PERIPHERAL VASCULAR CATHETERIZATION N/A 07/04/2015   Procedure: Pat Cath Insertion;  Surgeon: Selinda GORMAN Gu, MD;  Location: [facility] INVASIVE CV LAB;  Service: Cardiovascular;  Laterality: N/A;   PORTA CATH REMOVAL N/A 06/23/2018   Procedure: PORTA CATH REMOVAL;  Surgeon: Gu Selinda GORMAN, MD;  Location: [facility] INVASIVE CV LAB;  Service: Cardiovascular;  Laterality: N/A;   RIGHT/LEFT HEART CATH AND CORONARY ANGIOGRAPHY Bilateral 09/20/2019   Procedure: RIGHT/LEFT HEART CATH AND CORONARY ANGIOGRAPHY;  Surgeon: Mady Lonni, MD;  Location: [facility] INVASIVE CV LAB;  Service: Cardiovascular;  Laterality: Bilateral;    HEMATOLOGY/ONCOLOGY HISTORY:  Oncology History Overview Note  # JAN 2017- MANTLE CELL LYMPHOMA STAGE IV; [R Breast LN US  Core Bx-1.2cm LN/R Ax LN-Bx]; cyclin D Pos; Mitotic rate-LOW; MIPI score [5/intermediate risk]; BMBx-Positive for involvement. Feb 9th- START Benda-Ritux with neulasta ; Prolonged neutropenia; DISCONT- Benda-Ritux;   # April 13 th 2017- START R-CHOP x1; severe/prolonged neutropenia; PET- CR; BMBx-Neg; Disc R-CHOP # June 2022- DIAGNOSIS: thickened cortex of 12 mm. An additional lymph node demonstrates a thickened cortex of 7 mm.  A third lymph node demonstrates a mildly thickened cortex of 4 mm A. LYMPH NODE, LEFT AXILLA; ULTRASOUND-GUIDED BIOPSY:  - CD5+ MONOCLONAL B-CELL POPULATION; COMPATIBLE WITH INVOLVEMENT BY THE  PATIENT'S KNOWN MANTLE CELL LYMPHOMA.   #July 2022-second week-started ibrutinib  420 mg a dayx 1 week-stop because of severe rash.  Significant  clinical response noted.  # # 26th MAY 2017- Start Rituxan  q 19M Main OCT 12th 2017- PET NED.    # AUG 1st, 2022- start acalbrutinib. 9/23-2022-rituximab  weekly; Stop Rituxan  maintenance s/p 2  monthly  [facility] 2023-pneumonia]  # March 31st, 2023- right upper lobe pulmonary artery branches  consistent with pulmonary embolism- on eliquis  [until dec 2024]-    # Rheumatoid Arthritis [on MXT]; March 2017-MUGA scan-51 % --------------------------------------------------------    DIAGNOSIS: [jan 2017 ] Mantle cell lymphoma  STAGE: 4       Mantle cell lymphoma of lymph nodes of head, face, and neck (HCC)  02/22/2021 - 07/12/2021 Chemotherapy   Patient is on Treatment Plan : Rituximab  q 4 W     06/15/2024 -  Chemotherapy   Patient is on Treatment Plan : PANCREATIC Abraxane  D1,8,15 + Gemcitabine  D1,8,15 q28d     Primary cancer of head of pancreas (HCC)  06/07/2024 Initial Diagnosis   Primary cancer of head of pancreas (HCC)   06/07/2024 Cancer Staging   Staging form: Exocrine Pancreas, AJCC 8th Edition - Clinical: Stage IB (cT2, cN0, cM0) - Signed by Rennie Cindy SAUNDERS, MD on 06/07/2024 Total positive nodes: 0   06/15/2024 -  Chemotherapy   Patient is on Treatment Plan : PANCREATIC Abraxane  D1,8,15 + Gemcitabine  D1,8,15 q28d       ALLERGIES:  has no known allergies.  MEDICATIONS:  Current Outpatient Medications  Medication Sig Dispense Refill   feeding supplement (ENSURE ENLIVE / ENSURE PLUS) LIQD Take 237 mLs by mouth 3 (three) times daily between meals. 237 mL 12   furosemide  (LASIX ) 40 MG tablet TAKE 1 TABLET BY MOUTH EVERY DAY 90 tablet 1   ipratropium-albuterol  (DUONEB) 0.5-2.5 (3) MG/3ML SOLN Take 3 mLs by nebulization every 6 (six) hours as needed. 300 mL 11   pantoprazole  (PROTONIX ) 20 MG tablet TAKE 2 TABLETS (40 MG TOTAL) BY MOUTH EVERY MORNING. TAKE 30 MINUTES TO HOUR BEFORE BREAKFAST 180 tablet 1   potassium chloride  (KLOR-CON  M) 10 MEQ tablet TAKE 1 TABLET BY MOUTH EVERY DAY 90  tablet 1   TRELEGY ELLIPTA  100-62.5-25 MCG/ACT AEPB Inhale 1 puff into the lungs daily.     carvedilol  (COREG ) 3.125 MG tablet TAKE 1 TABLET BY MOUTH TWICE A DAY (Patient not taking: Reported on 06/07/2024) 180 tablet 3   HYDROcodone -acetaminophen  (NORCO/VICODIN) 5-325 MG tablet Take 1 tablet by mouth every 8 (eight) hours as needed for moderate pain (pain score 4-6). 90 tablet 0   hydroxychloroquine  (PLAQUENIL ) 200 MG tablet Take 1 tablet by mouth 2 (two) times daily. (Patient not taking: Reported on 06/07/2024)     losartan  (COZAAR ) 25 MG tablet TAKE 1/2 TABLET BY MOUTH EVERY DAY (Patient not taking: Reported on 06/07/2024) 45 tablet 0   MEGARED OMEGA-3 KRILL OIL PO Take 1 tablet by mouth daily. (Patient not taking: Reported on 06/07/2024)     Multiple Vitamins-Minerals (CENTRUM SILVER 50+WOMEN) TABS Take 1 tablet by mouth daily. (Patient not taking: Reported on 06/07/2024)     ondansetron  (ZOFRAN ) 8 MG tablet ONE PILL EVERY 8 HOURS AS NEEDED FOR NAUSEA/VOMITTING. 40 tablet 1   OXYGEN  Inhale into the lungs. 2 Liters 24/7  prochlorperazine  (COMPAZINE ) 10 MG tablet Take 1 tablet (10 mg total) by mouth every 6 (six) hours as needed for nausea or vomiting. 30 tablet 0   rosuvastatin  (CRESTOR ) 5 MG tablet TAKE 1 TABLET (5 MG TOTAL) BY MOUTH DAILY. (Patient not taking: Reported on 06/07/2024) 90 tablet 3   No current facility-administered medications for this visit.   Facility-Administered Medications Ordered in Other Visits  Medication Dose Route Frequency Provider Last Rate Last Admin   heparin  lock flush 100 unit/mL  500 Units Intravenous Once Brahmanday, Govinda R, MD       sodium chloride  flush (NS) 0.9 % injection 10 mL  10 mL Intravenous Once Brahmanday, Govinda R, MD       sodium chloride  flush (NS) 0.9 % injection 10 mL  10 mL Intravenous Once Brahmanday, Govinda R, MD        VITAL SIGNS: BP 103/71 (BP Location: Left Arm, Patient Position: Sitting)   Pulse 97   Temp (!) 97.4 F (36.3 C)  (Tympanic)   Ht 5' 7 (1.702 m)   Wt 201 lb (91.2 kg)   SpO2 100%   BMI 31.48 kg/m  Filed Weights   06/09/24 1331  Weight: 201 lb (91.2 kg)    Estimated body mass index is 31.48 kg/m as calculated from the following:   Height as of this encounter: 5' 7 (1.702 m).   Weight as of this encounter: 201 lb (91.2 kg).  LABS: CBC:    Component Value Date/Time   WBC 9.8 06/07/2024 1502   WBC 8.6 04/12/2024 1134   HGB 13.7 06/07/2024 1502   HGB 13.4 09/08/2019 1359   HCT 41.0 06/07/2024 1502   HCT 40.9 09/08/2019 1359   PLT 261 06/07/2024 1502   PLT 194 09/08/2019 1359   MCV 91.5 06/07/2024 1502   MCV 90 09/08/2019 1359   MCV 89 09/14/2013 1127   NEUTROABS 7.7 06/07/2024 1502   NEUTROABS 3.8 09/08/2019 1359   LYMPHSABS 1.3 06/07/2024 1502   LYMPHSABS 1.3 09/08/2019 1359   MONOABS 0.6 06/07/2024 1502   EOSABS 0.1 06/07/2024 1502   EOSABS 0.1 09/08/2019 1359   BASOSABS 0.0 06/07/2024 1502   BASOSABS 0.0 09/08/2019 1359   Comprehensive Metabolic Panel:    Component Value Date/Time   NA 134 (L) 06/07/2024 1502   NA 145 (H) 09/08/2019 1359   NA 142 09/14/2013 1127   K 3.6 06/07/2024 1502   K 3.5 09/14/2013 1127   CL 93 (L) 06/07/2024 1502   CL 110 (H) 09/14/2013 1127   CO2 28 06/07/2024 1502   CO2 29 09/14/2013 1127   BUN 13 06/07/2024 1502   BUN 15 09/08/2019 1359   BUN 8 09/14/2013 1127   CREATININE 0.90 06/07/2024 1502   CREATININE 0.71 09/14/2013 1127   CREATININE 0.68 04/01/2013 1550   GLUCOSE 105 (H) 06/07/2024 1502   GLUCOSE 106 (H) 09/14/2013 1127   CALCIUM  10.1 06/07/2024 1502   CALCIUM  8.6 09/14/2013 1127   AST 22 06/07/2024 1502   ALT 12 06/07/2024 1502   ALT 19 09/14/2013 1127   ALKPHOS 81 06/07/2024 1502   ALKPHOS 76 09/14/2013 1127   BILITOT 0.5 06/07/2024 1502   PROT 7.2 06/07/2024 1502   PROT 7.6 09/14/2013 1127   ALBUMIN 4.0 06/07/2024 1502   ALBUMIN 3.2 (L) 09/14/2013 1127    RADIOGRAPHIC STUDIES: No results found.  PERFORMANCE STATUS  (ECOG) : 2 - Symptomatic, <50% confined to bed  Review of Systems Unless otherwise noted, a complete review of  systems is negative.  Physical Exam General: NAD Cardiovascular: regular rate and rhythm Pulmonary: clear ant fields Abdomen: soft, nontender, + bowel sounds GU: no suprapubic tenderness Extremities: no edema, no joint deformities Skin: no rashes Neurological: Weakness but otherwise nonfocal  IMPRESSION: Patient with newly diagnosed pancreatic cancer.  She has multiple other medical problems including nonischemic cardiomyopathy, chronic respiratory failure on home O2, and mantle cell lymphoma on Calquence .  Patient was felt to be a borderline surgical candidate.  Opted to proceed with Abraxane  plus gemcitabine .  Patient confirms plan for pursuing chemotherapy.  She is pending surgical evaluation at [facility].  Symptomatically, she endorses intermittent upper abdominal pain, which radiates around to her back.  She says that the Norco is helping but is constipating and she does not like taking it.  She is interested in speaking with interventional radiology about possible option of a celiac plexus block.  In interim, will liberalize Norco and maximize bowel regimen.  Patient does not have advance directives.  She was provided with ACP documents and a MOST form today.  She would like to appoint her nieces to be her healthcare proxies.  At baseline, patient lives at home with her sister.  She does report having good family support who are available to assist as needed.  PLAN: - Continue current scope of treatment - Referral to IR for consideration of celiac plexus block - Increase Norco every 6 hours as needed - Daily bowel regimen with MiraLAX /senna - ACP/MOST form provided - Follow-up telephone visit 3 to 4 weeks   Patient expressed understanding and was in agreement with this plan. She also understands that She can call the clinic at any time with any questions, concerns, or  complaints.     Time Total: 25 minutes  Visit consisted of counseling and education dealing with the complex and emotionally intense issues of symptom management and palliative care in the setting of serious and potentially life-threatening illness.Greater than 50%  of this time was spent counseling and coordinating care related to the above assessment and plan.  Signed by: Fonda Mower, PhD, NP-C   "

## 2024-06-09 NOTE — Progress Notes (Signed)
 Nutrition Assessment   Reason for Assessment:  Referral for concerns of malnutrition   ASSESSMENT:  84 year old female with pancreatic cancer borderline candidate for surgery.  Past medical history of mantle cell lymphoma in 2017, GERD, Ht failure, ischemic cardiomyopathy, pulmonary PE, RA, HTN.  Patient planning abraxane  and gemcitabine .    Met with patient and sister.  Lives with another sister Beckey.  Sister does grocery shopping and helps prepare meals.  Patient prepares some meals herself.  Reports poor po intake for the last month or more.  Eating less than 50% of normal intake.  Yesterday ate crackers and sloppy joe and drank a sprite.  Drinks ensure original about 2 times a  week.  Reports abdominal pain and constipation.  Had a bowel movement with dulcolax yesterday.     Medications: zofran , compazine , lasix , KCL, protonix    Labs: Na 134, glucose 105   Anthropometrics:   Height: 67 inches Weight: 201 lb 225 lb on 8/11 BMI: 31  11% weight loss in the last 5 months   Estimated Energy Needs  Kcals: 2000-2275 Protein: 91-109 g Fluid: 2000 ml   NUTRITION DIAGNOSIS: Inadequate oral intake related to cancer as evidenced by 11% weight loss in 5 months and poor po intake   INTERVENTION:  Encouraged bowel regimen to prevent constipation.  Recommend trying 350 calorie shake. Coupons given and drinking at least 1 a day Discussed small frequent meals q 2 hours Contact information provided   MONITORING, EVALUATION, GOAL: weight trends, intake   Next Visit: Wed, Jan 28 during infusion  Laelle Bridgett B. Dasie SOLON, CSO, LDN Registered Dietitian [phone]

## 2024-06-14 NOTE — Progress Notes (Signed)
 Karalee Wilkie POUR, MD sent to Carlie Clarita RAMAN Approved.  Moderate sedation.  HKM       Previous Messages    ----- Message ----- From: Carlie Clarita RAMAN Sent: 06/13/2024  12:40 PM EST To: Wilkie POUR Karalee, MD Subject: ATTN: Dr VEAR Karalee  CT celiac plexus block  IR Approval Request:   Procedure:    CT celiac plexus block    (for consideration)  Reason:         Primary cancer of head of pancreas  / intermittent upper abdominal pain, which     radiates around to her back  Provider:       Fonda Mower, NP  Provider Contact #    [facility] [place]   [phone]

## 2024-06-15 ENCOUNTER — Telehealth: Payer: Self-pay

## 2024-06-15 NOTE — Telephone Encounter (Signed)
 Celiac plexus block scheduled for June 21, 2023  Arrive at 1030  for 1130 appointment Come into the Heart and Vascular entrance at [facility]. This entrance is located in the front of the hospital. Do not eat or drink anything after midnight Take your regularly scheduled blood pressure, pain, or seizure medication You will need a driver for this procedure.   Reviewed instructions with niece, Kenneth.

## 2024-06-16 ENCOUNTER — Inpatient Hospital Stay: Admitting: Internal Medicine

## 2024-06-16 ENCOUNTER — Inpatient Hospital Stay

## 2024-06-16 ENCOUNTER — Encounter: Payer: Self-pay | Admitting: Internal Medicine

## 2024-06-16 ENCOUNTER — Other Ambulatory Visit: Payer: Self-pay | Admitting: *Deleted

## 2024-06-16 VITALS — BP 110/72 | HR 64 | Temp 96.1°F | Resp 18 | Ht 67.0 in | Wt 196.8 lb

## 2024-06-16 VITALS — BP 112/62 | HR 66

## 2024-06-16 DIAGNOSIS — C8311 Mantle cell lymphoma, lymph nodes of head, face, and neck: Secondary | ICD-10-CM

## 2024-06-16 DIAGNOSIS — C250 Malignant neoplasm of head of pancreas: Secondary | ICD-10-CM

## 2024-06-16 DIAGNOSIS — G893 Neoplasm related pain (acute) (chronic): Secondary | ICD-10-CM

## 2024-06-16 DIAGNOSIS — Z5111 Encounter for antineoplastic chemotherapy: Secondary | ICD-10-CM | POA: Diagnosis not present

## 2024-06-16 LAB — CMP (CANCER CENTER ONLY)
ALT: 19 U/L (ref 0–44)
AST: 22 U/L (ref 15–41)
Albumin: 4 g/dL (ref 3.5–5.0)
Alkaline Phosphatase: 89 U/L (ref 38–126)
Anion gap: 11 (ref 5–15)
BUN: 12 mg/dL (ref 8–23)
CO2: 27 mmol/L (ref 22–32)
Calcium: 10.3 mg/dL (ref 8.9–10.3)
Chloride: 94 mmol/L — ABNORMAL LOW (ref 98–111)
Creatinine: 0.86 mg/dL (ref 0.44–1.00)
GFR, Estimated: >60 mL/min
Glucose, Bld: 133 mg/dL — ABNORMAL HIGH (ref 70–99)
Potassium: 5.4 mmol/L — ABNORMAL HIGH (ref 3.5–5.1)
Sodium: 131 mmol/L — ABNORMAL LOW (ref 135–145)
Total Bilirubin: 0.6 mg/dL (ref 0.0–1.2)
Total Protein: 7.2 g/dL (ref 6.5–8.1)

## 2024-06-16 LAB — CBC WITH DIFFERENTIAL (CANCER CENTER ONLY)
Abs Immature Granulocytes: 0.07 K/uL (ref 0.00–0.07)
Basophils Absolute: 0 K/uL (ref 0.0–0.1)
Basophils Relative: 0 %
Eosinophils Absolute: 0.1 K/uL (ref 0.0–0.5)
Eosinophils Relative: 1 %
HCT: 40.7 % (ref 36.0–46.0)
Hemoglobin: 13.3 g/dL (ref 12.0–15.0)
Immature Granulocytes: 1 %
Lymphocytes Relative: 12 %
Lymphs Abs: 1.2 K/uL (ref 0.7–4.0)
MCH: 30.2 pg (ref 26.0–34.0)
MCHC: 32.7 g/dL (ref 30.0–36.0)
MCV: 92.5 fL (ref 80.0–100.0)
Monocytes Absolute: 0.5 K/uL (ref 0.1–1.0)
Monocytes Relative: 5 %
Neutro Abs: 8.1 K/uL — ABNORMAL HIGH (ref 1.7–7.7)
Neutrophils Relative %: 81 %
Platelet Count: 298 K/uL (ref 150–400)
RBC: 4.4 MIL/uL (ref 3.87–5.11)
RDW: 12.6 % (ref 11.5–15.5)
WBC Count: 10 K/uL (ref 4.0–10.5)
nRBC: 0 % (ref 0.0–0.2)

## 2024-06-16 MED ORDER — SODIUM CHLORIDE 0.9 % IV SOLN
INTRAVENOUS | Status: DC
  Filled 2024-06-16: qty 250

## 2024-06-16 MED ORDER — PACLITAXEL PROTEIN-BOUND CHEMO INJECTION 100 MG
125.0000 mg/m2 | Freq: Once | INTRAVENOUS | Status: AC
  Administered 2024-06-16: 260 mg via INTRAVENOUS
  Filled 2024-06-16: qty 52

## 2024-06-16 MED ORDER — ONDANSETRON HCL 8 MG PO TABS: ORAL_TABLET | ORAL | 1 refills | Status: AC

## 2024-06-16 MED ORDER — SODIUM CHLORIDE 0.9 % IV SOLN
INTRAVENOUS | Status: DC
  Administered 2024-06-16: 250 mL via INTRAVENOUS
  Filled 2024-06-16: qty 250

## 2024-06-16 MED ORDER — SODIUM CHLORIDE 0.9 % IV SOLN
1000.0000 mg/m2 | Freq: Once | INTRAVENOUS | Status: AC
  Administered 2024-06-16: 2052 mg via INTRAVENOUS
  Filled 2024-06-16: qty 53.97

## 2024-06-16 MED ORDER — PROCHLORPERAZINE MALEATE 10 MG PO TABS: 10.0000 mg | ORAL_TABLET | Freq: Four times a day (QID) | ORAL | 0 refills | Status: AC | PRN

## 2024-06-16 MED ORDER — MORPHINE SULFATE (PF) 4 MG/ML IV SOLN
2.0000 mg | Freq: Once | INTRAVENOUS | Status: AC
  Administered 2024-06-16: 2 mg via INTRAVENOUS
  Filled 2024-06-16: qty 1

## 2024-06-16 MED ORDER — PROCHLORPERAZINE MALEATE 10 MG PO TABS
10.0000 mg | ORAL_TABLET | Freq: Once | ORAL | Status: AC
  Administered 2024-06-16: 10 mg via ORAL
  Filled 2024-06-16: qty 1

## 2024-06-16 NOTE — Progress Notes (Signed)
 Patient states she is hurting today, would like pain addressed during today's visit.

## 2024-06-16 NOTE — Assessment & Plan Note (Addendum)
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  NOV-DEC 2025- worsening abdominal pain-  ill-defined T1 hypointense approximately 2.0 x 2.7 cm (series 18, image 55), lesion centered in the uncinate process/pancreatic head#  # NOV 2025- abdominal pain- ? 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 Short-term follow-up examination is recommended in 3 months to document resolution; There is a 4 x 8 mm loculated T2 hyperintense non-aggressive lesion in the pancreatic body.  Elevated tumor markers-CA 19-9.  Plan checking NGS if tissue available.  Status post tumor conference review discussion.  # Patient is currently awaiting surgical evaluation at [facility] tomorrow January 16.  # Patient poor candidate for aggressive chemotherapy-Will plan will plan gemcitabine  Abraxane  every 2 weeks with appropriate dose reductions given patient's performance status.  Patient also get growth factor support on due to chemotherapy [History of severe neutropenia to chemotherapy- [prior Bendamustine ].  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 PET scan JUNE 23rd, 2025- No findings for recurrent lymphoma.   # Currently on Calequence 100 mg twice a day [ AUG 1st, 2022]-  on HOLD given new diagnosis of pancreatic cancer.  # Immunocompromised state: Hx Multifocal pneumonia-[April 2023]-Given the multiple pneumonias/high risk of death-recommended continuation of further rituximab . MARCH 2024- IgG- 800.  Due to monitor closely while on chemotherapy.  # Abdominal pain-likely secondary pancreatic cancer.  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  Patient unable to refill her pain prescriptions-reminded the  prescription has been sent to pharmacy last week.  To call and recheck on the prescription.  Will give morphine  2 mg for acute pain control.  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   # Hx of Rheumatoid arthritis-given ILD-  s/p evaluation with  Rheumatology- stable.   # Left shoulder pain/ Chronic arthritis-on meloxicam  as needed- stable.   #Vaccinations :recommend  flu shot-   COVID booster/RSV; ? Pneumonia vaccination [check with PCP]  # PIV:   # ACP- done.   # DISPOSITION:  # chemo today; morphine  2 mg IVP-  #  injection tomorrow- needs to be scheduled. [In AM has apt at [facility] later in the afternoon tomorrow] # q 2 weeks- ca-19-9 #  as per IS- Dr.B

## 2024-06-16 NOTE — Patient Instructions (Signed)
 [facility] - A DEPT OF [facility]  Discharge Instructions: Thank you for choosing [facility] to provide your oncology and hematology care.  If you have a lab appointment with the Cancer Center, please go directly to the Cancer Center and check in at the registration area.  Wear comfortable clothing and clothing appropriate for easy access to any Portacath or PICC line.   We strive to give you quality time with your provider. You may need to reschedule your appointment if you arrive late (15 or more minutes).  Arriving late affects you and other patients whose appointments are after yours.  Also, if you miss three or more appointments without notifying the office, you may be dismissed from the clinic at the providers discretion.      For prescription refill requests, have your pharmacy contact our office and allow 72 hours for refills to be completed.    Today you received the following chemotherapy and/or immunotherapy agents: Abraxane   + Gemcitabine        To help prevent nausea and vomiting after your treatment, we encourage you to take your nausea medication as directed.  BELOW ARE SYMPTOMS THAT SHOULD BE REPORTED IMMEDIATELY: *FEVER GREATER THAN 100.4 F (38 C) OR HIGHER *CHILLS OR SWEATING *NAUSEA AND VOMITING THAT IS NOT CONTROLLED WITH YOUR NAUSEA MEDICATION *UNUSUAL SHORTNESS OF BREATH *UNUSUAL BRUISING OR BLEEDING *URINARY PROBLEMS (pain or burning when urinating, or frequent urination) *BOWEL PROBLEMS (unusual diarrhea, constipation, pain near the anus) TENDERNESS IN MOUTH AND THROAT WITH OR WITHOUT PRESENCE OF ULCERS (sore throat, sores in mouth, or a toothache) UNUSUAL RASH, SWELLING OR PAIN  UNUSUAL VAGINAL DISCHARGE OR ITCHING   Items with * indicate a potential emergency and should be followed up as soon as possible or go to the Emergency Department if any problems should occur.  Please show the CHEMOTHERAPY ALERT CARD or  IMMUNOTHERAPY ALERT CARD at check-in to the Emergency Department and triage nurse.  Should you have questions after your visit or need to cancel or reschedule your appointment, please contact [facility] - A DEPT OF [facility]  [phone] and follow the prompts.  Office hours are 8:00 a.m. to 4:30 p.m. Monday - Friday. Please note that voicemails left after 4:00 p.m. may not be returned until the following business day.  We are closed weekends and major holidays. You have access to a nurse at all times for urgent questions. Please call the main number to the clinic [phone] and follow the prompts.  For any non-urgent questions, you may also contact your provider using MyChart. We now offer e-Visits for anyone 2 and older to request care online for non-urgent symptoms. For details visit [email or web address].   Also download the MyChart app! Go to the app store, search MyChart, open the app, select [employer], and log in with your MyChart username and password.

## 2024-06-16 NOTE — Progress Notes (Signed)
 [facility] OFFICE PROGRESS NOTE  Patient Care Team: Dineen Rollene MATSU, FNP as PCP - General (Family Medicine) End, Lonni, MD as PCP - Cardiology (Cardiology) Fernande Elspeth BROCKS, MD (Inactive) as PCP - Electrophysiology (Cardiology) [name], MD (Internal Medicine) Rennie Cindy SAUNDERS, MD as Consulting Physician (Oncology) Lenn Aran, MD as Consulting Physician (Radiation Oncology) Lenn Aran, MD as Consulting Physician (Radiation Oncology)   Cancer Staging  Primary cancer of head of pancreas [facility]) Staging form: Exocrine Pancreas, AJCC 8th Edition - Clinical: Stage IB (cT2, cN0, cM0) - Signed by Rennie Cindy SAUNDERS, MD on 06/07/2024 Total positive nodes: 0    Oncology History Overview Note  # JAN 2017- MANTLE CELL LYMPHOMA STAGE IV; [R Breast LN US  Core Bx-1.2cm LN/R Ax LN-Bx]; cyclin D Pos; Mitotic rate-LOW; MIPI score [5/intermediate risk]; BMBx-Positive for involvement. Feb 9th- START Benda-Ritux with neulasta ; Prolonged neutropenia; DISCONT- Benda-Ritux;   # April 13 th 2017- START R-CHOP x1; severe/prolonged neutropenia; PET- CR; BMBx-Neg; Disc R-CHOP # June 2022- DIAGNOSIS: thickened cortex of 12 mm. An additional lymph node demonstrates a thickened cortex of 7 mm. A third lymph node demonstrates a mildly thickened cortex of 4 mm A. LYMPH NODE, LEFT AXILLA; ULTRASOUND-GUIDED BIOPSY:  - CD5+ MONOCLONAL B-CELL POPULATION; COMPATIBLE WITH INVOLVEMENT BY THE  PATIENT'S KNOWN MANTLE CELL LYMPHOMA.   #July 2022-second week-started ibrutinib  420 mg a dayx 1 week-stop because of severe rash.  Significant clinical response noted.  # # 26th MAY 2017- Start Rituxan  q 90M Main OCT 12th 2017- PET NED.    # AUG 1st, 2022- start acalbrutinib. 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, face, and neck (HCC)  02/22/2021 - 07/12/2021 Chemotherapy   Patient is on Treatment Plan : Rituximab  q 4 W     06/16/2024 -  Chemotherapy   Patient is on Treatment Plan : PANCREATIC Abraxane  D1,8,15 + Gemcitabine  D1,8,15 q28d     Primary cancer of head of pancreas (HCC)  06/07/2024 Initial Diagnosis   Primary cancer of head of pancreas (HCC)   06/07/2024 Cancer Staging   Staging form: Exocrine Pancreas, AJCC 8th Edition - Clinical: Stage IB (cT2, cN0, cM0) - Signed by Rennie Cindy SAUNDERS, MD on 06/07/2024 Total positive nodes: 0   06/16/2024 -  Chemotherapy   Patient is on Treatment Plan : PANCREATIC Abraxane  D1,8,15 + Gemcitabine  D1,8,15 q28d      INTERVAL HISTORY: Accompanied by her brother ambulating wheelchair.   [name] Shed 84 y.o.  female pleasant patient multiple medical problems-ischemic cardiomyopathy; pulm hypertension; Hx of PE OFF eliquis  and above history of recurrent mantle cell lymphoma on Calquence - on HOLD; Pancreatic adenocarcinoma here for follow-up.  Discussed the use of AI scribe software for clinical note transcription with the patient, who gave verbal consent to proceed.  History of Present Illness   HAYDEE JABBOUR is an 84 year old female with stage I pancreatic cancer undergoing chemotherapy who presents for management of severe neoplasm-related pain.  She reports severe, persistent pain rated 9/10, which has progressively worsened and is described  as intolerable. Pain is associated with significant nausea, particularly during episodes of increased intensity. She has been taking oxycodone  as needed for pain control but ran out of medication two days  prior to the visit. A recent prescription for ninety pills sent on January 8th was not dispensed by the pharmacy, and her niece was unable to obtain it from [facility]. She has an upcoming radiology appointment to discuss a nerve block and is scheduled for surgical evaluation at [facility] regarding possible operative intervention.  She has a history of chemotherapy-induced neutropenia, with prior episodes of cytopenia requiring booster injections.  She expressed difficulty managing her medications and appointments, with family members assisting in her care and transportation.      Review of Systems  Constitutional:  Negative for chills, diaphoresis, fever, malaise/fatigue and weight loss.  HENT:  Negative for nosebleeds and sore throat.   Eyes:  Negative for double vision.  Respiratory:  Negative for hemoptysis, sputum production and wheezing.   Cardiovascular:  Negative for chest pain, palpitations, orthopnea and leg swelling.  Gastrointestinal:  Positive for constipation. Negative for abdominal pain, blood in stool, diarrhea, heartburn, melena, nausea and vomiting.  Genitourinary:  Negative for dysuria, frequency and urgency.  Musculoskeletal:  Positive for back pain and joint pain.  Skin: Negative.  Negative for itching and rash.  Neurological:  Negative for dizziness, tingling, focal weakness, weakness and headaches.  Endo/Heme/Allergies:  Does not bruise/bleed easily.  Psychiatric/Behavioral:  Negative for depression. The patient is not nervous/anxious and does not have insomnia.     PAST MEDICAL HISTORY :  Past Medical History:  Diagnosis Date   Arthritis    Collagen vascular disease    GERD (gastroesophageal reflux disease)    Headache(784.0)    HFrEF (heart failure with reduced ejection fraction) (HCC)    Nonischemic cardiomyopathy, LVEF as low as 30-35% in 08/2019   History of methotrexate  therapy    Hyperlipidemia    hx   Lymphadenopathy of head and neck 01/2015   see on Thyroid   ultrasound   Lymphoma, mantle cell (HCC) 06/01/2015   bx of lymph node in right breast/Stage IV Mantle Cell Lymphoma   Multifocal pneumonia 10/05/2021   Personal history of chemotherapy    Pulmonary embolism (HCC) 10/05/2021   acute   Respiratory failure (HCC) 10/05/2021   acute   Rheumatoid arthritis (HCC)     PAST SURGICAL HISTORY :   Past Surgical History:  Procedure Laterality Date   BREAST BIOPSY Left 11/07/2020   u/s bx-hydromark #3 coil-path pending   CARDIAC CATHETERIZATION  09/2004   [facility]; EF 60%   CARDIAC CATHETERIZATION  08/2004   [facility]   IR FLUORO GUIDED NEEDLE PLC ASPIRATION/INJECTION LOC  06/18/2018   PERIPHERAL VASCULAR CATHETERIZATION N/A 07/04/2015   Procedure: Pat Cath Insertion;  Surgeon: Selinda GORMAN Gu, MD;  Location: [facility] INVASIVE CV LAB;  Service: Cardiovascular;  Laterality: N/A;   PORTA CATH REMOVAL N/A 06/23/2018   Procedure: PORTA CATH REMOVAL;  Surgeon: Gu Selinda GORMAN, MD;  Location: [facility] INVASIVE CV LAB;  Service: Cardiovascular;  Laterality: N/A;   RIGHT/LEFT HEART CATH AND CORONARY ANGIOGRAPHY Bilateral 09/20/2019   Procedure: RIGHT/LEFT HEART CATH AND CORONARY ANGIOGRAPHY;  Surgeon: Mady Bruckner, MD;  Location: [facility] INVASIVE CV LAB;  Service: Cardiovascular;  Laterality: Bilateral;    FAMILY HISTORY :   Family History  Problem Relation Age of Onset   Diabetes Mother    Cholelithiasis Mother    Hypertension Sister    Diabetes Sister  Heart murmur Sister    Arthritis Brother    Breast cancer Neg Hx     SOCIAL HISTORY:   Social History   Tobacco Use   Smoking status: Never   Smokeless tobacco: Never  Vaping Use   Vaping status: Never Used  Substance Use Topics   Alcohol use: No   Drug use: No    ALLERGIES:  has no known allergies.  MEDICATIONS:  Current Outpatient Medications  Medication Sig Dispense Refill   feeding supplement (ENSURE ENLIVE / ENSURE PLUS) LIQD Take 237 mLs by mouth 3 (three) times daily between meals. 237 mL 12    furosemide  (LASIX ) 40 MG tablet TAKE 1 TABLET BY MOUTH EVERY DAY 90 tablet 1   ipratropium-albuterol  (DUONEB) 0.5-2.5 (3) MG/3ML SOLN Take 3 mLs by nebulization every 6 (six) hours as needed. 300 mL 11   pantoprazole  (PROTONIX ) 20 MG tablet TAKE 2 TABLETS (40 MG TOTAL) BY MOUTH EVERY MORNING. TAKE 30 MINUTES TO HOUR BEFORE BREAKFAST 180 tablet 1   potassium chloride  (KLOR-CON  M) 10 MEQ tablet TAKE 1 TABLET BY MOUTH EVERY DAY 90 tablet 1   TRELEGY ELLIPTA  100-62.5-25 MCG/ACT AEPB Inhale 1 puff into the lungs daily.     carvedilol  (COREG ) 3.125 MG tablet TAKE 1 TABLET BY MOUTH TWICE A DAY (Patient not taking: Reported on 06/07/2024) 180 tablet 3   HYDROcodone -acetaminophen  (NORCO/VICODIN) 5-325 MG tablet Take 1 tablet by mouth every 6 (six) hours as needed for moderate pain (pain score 4-6). 90 tablet 0   hydroxychloroquine  (PLAQUENIL ) 200 MG tablet Take 1 tablet by mouth 2 (two) times daily. (Patient not taking: Reported on 06/07/2024)     losartan  (COZAAR ) 25 MG tablet TAKE 1/2 TABLET BY MOUTH EVERY DAY (Patient not taking: Reported on 06/07/2024) 45 tablet 0   MEGARED OMEGA-3 KRILL OIL PO Take 1 tablet by mouth daily. (Patient not taking: Reported on 06/07/2024)     Multiple Vitamins-Minerals (CENTRUM SILVER 50+WOMEN) TABS Take 1 tablet by mouth daily. (Patient not taking: Reported on 06/07/2024)     ondansetron  (ZOFRAN ) 8 MG tablet ONE PILL EVERY 8 HOURS AS NEEDED FOR NAUSEA/VOMITTING. 40 tablet 1   OXYGEN  Inhale into the lungs. 2 Liters 24/7     prochlorperazine  (COMPAZINE ) 10 MG tablet Take 1 tablet (10 mg total) by mouth every 6 (six) hours as needed for nausea or vomiting. 30 tablet 0   rosuvastatin  (CRESTOR ) 5 MG tablet TAKE 1 TABLET (5 MG TOTAL) BY MOUTH DAILY. (Patient not taking: Reported on 06/07/2024) 90 tablet 3   No current facility-administered medications for this visit.   Facility-Administered Medications Ordered in Other Visits  Medication Dose Route Frequency Provider Last Rate Last Admin    0.9 %  sodium chloride  infusion   Intravenous Continuous Kayliegh Boyers R, MD       0.9 %  sodium chloride  infusion   Intravenous Continuous Tanaja Ganger R, MD 10 mL/hr at 06/16/24 1115 New Bag at 06/16/24 1115   gemcitabine  (GEMZAR ) 2,052 mg in sodium chloride  0.9 % 250 mL chemo infusion  1,000 mg/m2 (Treatment Plan Recorded) Intravenous Once Isidore Margraf R, MD       heparin  lock flush 100 unit/mL  500 Units Intravenous Once Alyus Mofield R, MD       PACLitaxel -protein bound (ABRAXANE ) chemo infusion 260 mg  125 mg/m2 (Treatment Plan Recorded) Intravenous Once Wilber Fini R, MD 104 mL/hr at 06/16/24 1148 260 mg at 06/16/24 1148   sodium chloride  flush (NS) 0.9 % injection 10 mL  10 mL Intravenous Once Nannie Starzyk R, MD       sodium chloride  flush (NS) 0.9 % injection 10 mL  10 mL Intravenous Once Takoda Siedlecki R, MD        PHYSICAL EXAMINATION: ECOG PERFORMANCE STATUS: 0 - Asymptomatic  BP 110/72 (BP Location: Left Arm, Patient Position: Sitting)   Pulse 64   Temp (!) 96.1 F (35.6 C) (Tympanic)   Resp 18   Ht 5' 7 (1.702 m)   Wt 196 lb 12.8 oz (89.3 kg)   SpO2 100%   BMI 30.82 kg/m   Filed Weights   06/16/24 0942  Weight: 196 lb 12.8 oz (89.3 kg)        Physical Exam HENT:     Head: Normocephalic and atraumatic.     Mouth/Throat:     Pharynx: No oropharyngeal exudate.  Eyes:     Pupils: Pupils are equal, round, and reactive to light.  Cardiovascular:     Rate and Rhythm: Normal rate and regular rhythm.  Pulmonary:     Effort: No respiratory distress.     Breath sounds: No wheezing.     Comments: Bilateral basilar crackles Abdominal:     General: Bowel sounds are normal. There is no distension.     Palpations: Abdomen is soft. There is no mass.     Tenderness: There is no abdominal tenderness. There is no guarding or rebound.  Musculoskeletal:        General: No tenderness. Normal range of motion.     Cervical  back: Normal range of motion and neck supple.  Skin:    General: Skin is warm.  Neurological:     Mental Status: She is alert and oriented to person, place, and time.  Psychiatric:        Mood and Affect: Affect normal.    LABORATORY DATA:  I have reviewed the data as listed    Component Value Date/Time   NA 131 (L) 06/16/2024 0916   NA 145 (H) 09/08/2019 1359   NA 142 09/14/2013 1127   K 5.4 (H) 06/16/2024 0916   K 3.5 09/14/2013 1127   CL 94 (L) 06/16/2024 0916   CL 110 (H) 09/14/2013 1127   CO2 27 06/16/2024 0916   CO2 29 09/14/2013 1127   GLUCOSE 133 (H) 06/16/2024 0916   GLUCOSE 106 (H) 09/14/2013 1127   BUN 12 06/16/2024 0916   BUN 15 09/08/2019 1359   BUN 8 09/14/2013 1127   CREATININE 0.86 06/16/2024 0916   CREATININE 0.71 09/14/2013 1127   CREATININE 0.68 04/01/2013 1550   CALCIUM  10.3 06/16/2024 0916   CALCIUM  8.6 09/14/2013 1127   PROT 7.2 06/16/2024 0916   PROT 7.6 09/14/2013 1127   ALBUMIN 4.0 06/16/2024 0916   ALBUMIN 3.2 (L) 09/14/2013 1127   AST 22 06/16/2024 0916   ALT 19 06/16/2024 0916   ALT 19 09/14/2013 1127   ALKPHOS 89 06/16/2024 0916   ALKPHOS 76 09/14/2013 1127   BILITOT 0.6 06/16/2024 0916   GFRNONAA >60 06/16/2024 0916   GFRNONAA >60 09/14/2013 1127   GFRAA >60 01/20/2020 1303   GFRAA >60 09/14/2013 1127    No results found for: SPEP, UPEP  Lab Results  Component Value Date   WBC 10.0 06/16/2024   NEUTROABS 8.1 (H) 06/16/2024   HGB 13.3 06/16/2024   HCT 40.7 06/16/2024   MCV 92.5 06/16/2024   PLT 298 06/16/2024      Chemistry      Component Value Date/Time  NA 131 (L) 06/16/2024 0916   NA 145 (H) 09/08/2019 1359   NA 142 09/14/2013 1127   K 5.4 (H) 06/16/2024 0916   K 3.5 09/14/2013 1127   CL 94 (L) 06/16/2024 0916   CL 110 (H) 09/14/2013 1127   CO2 27 06/16/2024 0916   CO2 29 09/14/2013 1127   BUN 12 06/16/2024 0916   BUN 15 09/08/2019 1359   BUN 8 09/14/2013 1127   CREATININE 0.86 06/16/2024 0916    CREATININE 0.71 09/14/2013 1127   CREATININE 0.68 04/01/2013 1550      Component Value Date/Time   CALCIUM  10.3 06/16/2024 0916   CALCIUM  8.6 09/14/2013 1127   ALKPHOS 89 06/16/2024 0916   ALKPHOS 76 09/14/2013 1127   AST 22 06/16/2024 0916   ALT 19 06/16/2024 0916   ALT 19 09/14/2013 1127   BILITOT 0.6 06/16/2024 0916       RADIOGRAPHIC STUDIES: I have personally reviewed the radiological images as listed and agreed with the findings in the report. No results found.   ASSESSMENT & PLAN:  Primary cancer of head of pancreas (HCC) #  stage IB-S/p endoscopic ultrasound  [Dr.Burbridge]- adenocarcinoma.  NOV-DEC 2025- worsening abdominal pain-  ill-defined T1 hypointense approximately 2.0 x 2.7 cm (series 18, image 55), lesion centered in the uncinate process/pancreatic head#  # NOV 2025- abdominal pain- ? 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 Short-term follow-up examination is recommended in 3 months to document resolution; There is a 4 x 8 mm loculated T2 hyperintense non-aggressive lesion in the pancreatic body.  Elevated tumor markers-CA 19-9.  Plan checking NGS if tissue available.  Status post tumor conference review discussion.  # Patient is currently awaiting surgical evaluation at [facility] tomorrow January 16.  # Patient poor candidate for aggressive chemotherapy-Will plan will plan gemcitabine  Abraxane  every 2 weeks with appropriate dose reductions given patient's performance status.  Patient also get growth factor support on due to chemotherapy [History of severe neutropenia to chemotherapy- [prior Bendamustine ].  # 2016-2017- Mantle cell lymphoma recurrent biopsy-proven [July 2022]; Currently  Calquence  on HOLD sec to recent diagnosis of pancreatic cancer. PET scan JUNE 23rd, 2025- No findings for recurrent lymphoma.   # Currently on Calequence 100 mg twice a  day [ AUG 1st, 2022]-  on HOLD given new diagnosis of pancreatic cancer.  # Immunocompromised state: Hx Multifocal pneumonia-[April 2023]-Given the multiple pneumonias/high risk of death-recommended continuation of further rituximab . MARCH 2024- IgG- 800.  Due to monitor closely while on chemotherapy.  # Abdominal pain-likely secondary pancreatic cancer.  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  Patient unable to refill her pain prescriptions-reminded the prescription has been sent to pharmacy last week.  To call and recheck on the prescription.  Will give morphine  2 mg for acute pain control.  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   # Hx of Rheumatoid arthritis-given ILD-  s/p evaluation  with  Rheumatology- stable.   # Left shoulder pain/ Chronic arthritis-on meloxicam  as needed- stable.   #Vaccinations :recommend  flu shot-   COVID booster/RSV; ? Pneumonia vaccination [check with PCP]  # PIV:   # ACP- done.   # DISPOSITION:  # chemo today; morphine  2 mg IVP-  #  injection tomorrow- needs to be scheduled. [In AM has apt at [facility] later in the afternoon tomorrow] # q 2 weeks- ca-19-9 #  as per IS- Dr.B          Orders Placed This Encounter  Procedures   CBC with Differential (Cancer Center Only)    Standing Status:   Future    Expected Date:    07/14/2024    Expiration Date:   07/14/2025   CMP (Cancer Center only)    Standing Status:   Future    Expected Date:   07/14/2024    Expiration Date:   07/14/2025   Cancer antigen 19-9    Standing Status:   Standing    Number of Occurrences:   25    Expiration Date:   06/16/2025     All questions were answered. The patient knows to call the clinic with any problems, questions or concerns.      Cindy JONELLE Joe, MD 06/16/2024 12:13 PM

## 2024-06-16 NOTE — Progress Notes (Signed)
 Within minutes of starting Gemzar  patient reported burning during infusion. 250ml bag of saline hung separately and open to gravity. Restarted Gemzar  at same rate, patient tolerated infusion with dilution.  Patient tolerated both treatments without additional concerns.   Discussed home medications. Antiemetics to be resent to [facility] [address].  No further concerns. Pt stable at discharge.

## 2024-06-17 ENCOUNTER — Inpatient Hospital Stay

## 2024-06-17 ENCOUNTER — Inpatient Hospital Stay: Admitting: Internal Medicine

## 2024-06-17 DIAGNOSIS — C8311 Mantle cell lymphoma, lymph nodes of head, face, and neck: Secondary | ICD-10-CM

## 2024-06-17 DIAGNOSIS — Z5111 Encounter for antineoplastic chemotherapy: Secondary | ICD-10-CM | POA: Diagnosis not present

## 2024-06-17 DIAGNOSIS — C250 Malignant neoplasm of head of pancreas: Secondary | ICD-10-CM

## 2024-06-17 MED ORDER — PEGFILGRASTIM-CBQV 6 MG/0.6ML ~~LOC~~ SOSY
6.0000 mg | PREFILLED_SYRINGE | Freq: Once | SUBCUTANEOUS | Status: AC
  Administered 2024-06-17: 6 mg via SUBCUTANEOUS
  Filled 2024-06-17: qty 0.6

## 2024-06-17 NOTE — Progress Notes (Signed)
 "  Surgical Oncology/General Surgery New Patient/Consultation Visit   Date of Visit: 06/17/2024   REFERRING/CO-MANAGING PHYSICIANS  Rennie Cindy SAUNDERS, MD [address] [place],  [place] [address]  Patient Care Team: Walker, Jennifer Azbell, MD as PCP - General  Diagnosis:    ICD-10-CM   1. Malignant neoplasm of pancreas, unspecified location of malignancy (CMS/HHS-HCC)  C25.9 ondansetron  (ZOFRAN -ODT) disintegrating tablet 8 mg    2. Nausea  R11.0 ondansetron  (ZOFRAN -ODT) disintegrating tablet 8 mg    3. Neoplasm of unspecified behavior of digestive system  D49.0 ondansetron  (ZOFRAN -ODT) disintegrating tablet 8 mg      CC: Surgical consult   ASSESSMENT & PLAN   1. Malignant neoplasm of pancreas, unspecified location of malignancy (CMS/HHS-HCC)   2. Nausea   3. Neoplasm of unspecified behavior of digestive system     # Karma Ansley is a 84 y.o. with PMH of RA, non ischemic cardiomyopathy, HFrEF 45-50%, Interstitial lung dx on home )3, PE 2023 (completed AC), GERD, OSA,. seen in consultation at request of Cindy SAUNDERS Rennie, MD for evaluation of Malignant neoplasm of pancreas, unspecified location of malignancy (CMS/HHS-HCC)  (primary encounter diagnosis) Plan: ondansetron  (ZOFRAN -ODT) disintegrating tablet        8 mg  Nausea Plan: ondansetron  (ZOFRAN -ODT) disintegrating tablet        8 mg  Neoplasm of unspecified behavior of digestive system Plan: ondansetron  (ZOFRAN -ODT) disintegrating tablet        8 mg . We have recommended the following:  -Continue chemotherapy -Follow up in 3 months with interval CT a/p and CA 19-9  KRISTA MACARIO PANTHER, NP Laymon Archer, MD     ONCOLOGY HISTORY     ICD-10-CM   1. Malignant neoplasm of pancreas, unspecified location of malignancy (CMS/HHS-HCC)  C25.9 ondansetron  (ZOFRAN -ODT) disintegrating tablet 8 mg    2. Nausea  R11.0 ondansetron  (ZOFRAN -ODT) disintegrating tablet 8 mg    3. Neoplasm of unspecified  behavior of digestive system  D49.0 ondansetron  (ZOFRAN -ODT) disintegrating tablet 8 mg      Oncology History   No problem history exists.   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  Histology: CD20+, CD5(weak)+, BCL-2+, BCL-6 (subset)+, cyclin D1+, CD10-, low mitotic rate  Molecular/Genetic: Cyclin D1 positive; MIPI score 5/intermediate risk  Pancreatic adenocarcinoma, diagnosed by FNA of pancreatic head mass  Histology: Adenocarcinoma  Molecular/Genetic: Not documented for pancreatic adenocarcinoma   Past Surgical History:  Procedure Laterality Date   ESOPHAGOGASTRODOUDENOSCOPY W/ULTRASOUND GUIDED ASPIRATION/BIOPSY N/A 06/01/2024   Procedure: ESOPHAGOGASTRODUODENOSCOPY, FLEXIBLE, TRANSORAL; W/ TRANSENDOSCOPIC ULTRASOUND FINE NEEDLE ASPIRATION/BIOPSY (INCL ENDOSCOPIC ULTRASOUND OF ESOPHAGUS, STOMACH, DUODENUM OR SURGICALLY ALTERED STOMACH);  Surgeon: Burbridge, Asberry Caldron, MD;  Location: [facility];  Service: Gastroenterology;  Laterality: N/A;   LT. KNEE SURGERY Left       The patient's disease status is up-to-date as described in the oncology history above.  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    The patient reports that she was having abdominal pain and nausea which prompted her to go to the  ED where a ct scan showed a mass at the head of the pancreas. She had no elevated LFTs and did not require a stent. CA 19-9 was in the 800s.  She underwent EGD/EUS and biopsy and was diagnosed with pancreatic adenocarcinoma. She has currently completed her first cycle of Gem/Abraxane . She still has  intermittent abdominal pain that she is taking oxycodone  and zofran  for. She has a celiac plexus block occurring this Monday.   She denies any abdominal surgeries, or blood thinners. She has interstitial lung disease and requires oxygen  PRN. She can walk 20 -50 feet, but then usually requires rest or oxygen .      Review of Systems:  Complete ROS was done, all pertinent/positives/negatives listed in HPI, all other systems negative.   PHYSICAL EXAMINATION   BP 105/70 (BP Location: Left upper arm, Patient Position: Sitting)   Pulse 63   Temp 36.7 C (98.1 F) (Oral)   Resp 18   Ht 164 cm (5' 4.57)   Wt 88.7 kg (195 lb 8.8 oz)   LMP  (LMP Unknown)   SpO2 98%   BMI 32.98 kg/m  General appearance: alert, appears stated age, and cooperative Lungs: unlabored on room air Heart: regular rate and rhythm, S1, S2 normal, no murmur, click, rub or gallop Abdomen:  Soft non distended , mildly tender to palpation epigastric region  No hernia appreciated Extremities: extremities normal, atraumatic, no cyanosis or edema Incisions: NA    LABS/STUDIES   :All labs, imaging, procedure reports, and pathology were reviewed   FUTURE APPOINTMENTS   Future Appointments     Date/Time Provider Department Center Visit Type   09/16/2024 3:00 PM (Arrive by 2:40 PM) [facility]  [facility] ABDPELINCCHESTMIPS   09/16/2024 4:00 PM (Arrive by 3:45 PM) Romero Toribio Mungo, MD [facility] RETURN VISIT      Attending Attestation  I personally saw the patient and performed a substantive portion of this encounter in conjunction with the listed resident as documented above.  84yoF with resectable pancreatic head cancer.  CA 19-9 488 at Dx, 839 last week, not jaundiced.  Her PS is preserved, though she does have some serious medical comorbidities.  She has started NA chemotherapy, which I certainly agree with.  We had a detailed conversation about  pancreatic cancer in general and about her particular case.  I am not sure whether surgery will be the right thing for her, but certainly I think it is worth me seeing her back after three months of systemic therapy with restaging imaging and we can discuss options at that point.  I will get in touch with Dr. Rennie and he and I can discuss further.  Toribio Romero, M.D.    "

## 2024-06-20 ENCOUNTER — Ambulatory Visit
Admission: RE | Admit: 2024-06-20 | Discharge: 2024-06-20 | Disposition: A | Discharge status: Home / Self Care | Source: Ambulatory Visit | Attending: Hospice and Palliative Medicine | Admitting: Hospice and Palliative Medicine

## 2024-06-20 DIAGNOSIS — Z01818 Encounter for other preprocedural examination: Secondary | ICD-10-CM

## 2024-06-20 NOTE — OR Nursing (Signed)
 Pt called due to not arriving for celiac block. The person who answered said they cancelled the procedure

## 2024-06-22 ENCOUNTER — Telehealth: Payer: Self-pay | Admitting: *Deleted

## 2024-06-22 NOTE — Telephone Encounter (Signed)
 Dr. KATHEE, Dr Toribio Hammersmith wants to talk to you re: this patient- [facility]. [phone]. He is requesting a return phone call.

## 2024-06-23 ENCOUNTER — Encounter: Payer: Self-pay | Admitting: Radiation Oncology

## 2024-06-23 ENCOUNTER — Ambulatory Visit
Admission: RE | Admit: 2024-06-23 | Discharge: 2024-06-23 | Disposition: A | Discharge status: Home / Self Care | Source: Ambulatory Visit | Attending: Radiation Oncology | Admitting: Radiation Oncology

## 2024-06-23 ENCOUNTER — Other Ambulatory Visit: Payer: Self-pay | Admitting: *Deleted

## 2024-06-23 VITALS — BP 104/72 | HR 81 | Temp 97.7°F | Resp 16 | Wt 198.0 lb

## 2024-06-23 DIAGNOSIS — K219 Gastro-esophageal reflux disease without esophagitis: Secondary | ICD-10-CM | POA: Diagnosis not present

## 2024-06-23 DIAGNOSIS — C250 Malignant neoplasm of head of pancreas: Secondary | ICD-10-CM | POA: Insufficient documentation

## 2024-06-23 DIAGNOSIS — Z9221 Personal history of antineoplastic chemotherapy: Secondary | ICD-10-CM | POA: Insufficient documentation

## 2024-06-23 DIAGNOSIS — I255 Ischemic cardiomyopathy: Secondary | ICD-10-CM | POA: Insufficient documentation

## 2024-06-23 DIAGNOSIS — C8310 Mantle cell lymphoma, unspecified site: Secondary | ICD-10-CM | POA: Diagnosis not present

## 2024-06-23 DIAGNOSIS — M069 Rheumatoid arthritis, unspecified: Secondary | ICD-10-CM | POA: Diagnosis not present

## 2024-06-23 DIAGNOSIS — I272 Pulmonary hypertension, unspecified: Secondary | ICD-10-CM | POA: Diagnosis not present

## 2024-06-23 DIAGNOSIS — Z86711 Personal history of pulmonary embolism: Secondary | ICD-10-CM | POA: Insufficient documentation

## 2024-06-23 DIAGNOSIS — Z79899 Other long term (current) drug therapy: Secondary | ICD-10-CM | POA: Diagnosis not present

## 2024-06-23 NOTE — Consult Note (Signed)
 " NEW PATIENT EVALUATION  Name: Michelle [name]  MRN: [id number]  Date:   06/23/2024     DOB: 1940/08/18   This 85 y.o. female patient presents to the clinic for initial evaluation of pancreatic cancer of the head of the pancreas clinical stage Ib (cT2 N0 M0).  REFERRING PHYSICIAN: Rennie Cindy SAUNDERS, MD  CHIEF COMPLAINT:  Chief Complaint  Patient presents with   Pancreatic Cancer    DIAGNOSIS: The encounter diagnosis was Primary cancer of head of pancreas (HCC).   PREVIOUS INVESTIGATIONS:  PET scan and MRI scans reviewed Clinical notes reviewed Pathology reports reviewed  HPI: Patient is a 84 year old female previously treated for mantle cell lymphoma stage IV by medical oncology.  Patient has multiple medical problems including ischemic cardiomyopathy pulmonary hypertension history of PE off Eliquis  and recurrent mantle cell lymphoma.  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  There was a 4.8 mm loculated T2 hyperintense lesion in the pancreatic body.  CT scan scan also showed 11 mm focal hypodensity in the pancreatic uncinate process with inflammatory stranding.  Patient had a PET CT scan back in June 2025 where I see some hypermetabolic activity in the pancreatic bed although this was not reported.  Patient has been having excruciating pain narcotic dependent although after chemotherapy this is improved.  She went to [facility] for consultation and they have declined surgery at this time and would like to reevaluate her after 4 cycles of chemotherapy.  She is seen today for radiation oncology opinion.  Patient did have endoscopic biopsy ultrasound-guided of the pancreatic head showing adenocarcinoma.  PLANNED TREATMENT REGIMEN: PET scan followed by decision on palliative radiation therapy to her pancreas  PAST MEDICAL HISTORY:  has a past  medical history of Arthritis, Collagen vascular disease, GERD (gastroesophageal reflux disease), Headache(784.0), HFrEF (heart failure with reduced ejection fraction) (HCC), History of methotrexate  therapy, Hyperlipidemia, Lymphadenopathy of head and neck (01/2015), Lymphoma, mantle cell (HCC) (06/01/2015), Multifocal pneumonia (10/05/2021), Personal history of chemotherapy, Pulmonary embolism (HCC) (10/05/2021), Respiratory failure (HCC) (10/05/2021), and Rheumatoid arthritis (HCC).    PAST SURGICAL HISTORY:  Past Surgical History:  Procedure Laterality Date   BREAST BIOPSY Left 11/07/2020   u/s bx-hydromark #3 coil-path pending   CARDIAC CATHETERIZATION  09/2004   [facility]; EF 60%   CARDIAC CATHETERIZATION  08/2004   [facility]   IR FLUORO GUIDED NEEDLE PLC ASPIRATION/INJECTION LOC  06/18/2018   PERIPHERAL VASCULAR CATHETERIZATION N/A 07/04/2015   Procedure: Pat Cath Insertion;  Surgeon: Selinda GORMAN Gu, MD;  Location: [facility] CV [facility];  Service: Cardiovascular;  Laterality: N/A;   PORTA CATH REMOVAL N/A 06/23/2018   Procedure: PORTA CATH REMOVAL;  Surgeon: Gu Selinda GORMAN, MD;  Location: [facility];  Service: Cardiovascular;  Laterality: N/A;   RIGHT/LEFT HEART CATH AND CORONARY ANGIOGRAPHY Bilateral 09/20/2019   Procedure: RIGHT/LEFT HEART CATH AND CORONARY ANGIOGRAPHY;  Surgeon: Mady Bruckner, MD;  Location: [facility] CV LAB;  Service: Cardiovascular;  Laterality: Bilateral;    FAMILY HISTORY: family history includes Arthritis in her brother; Cholelithiasis in her mother; Diabetes in her mother and sister; Heart murmur in her sister; Hypertension in her sister.  SOCIAL HISTORY:  reports that she has never smoked. She has never used smokeless tobacco. She reports that she does not drink alcohol and does not use drugs.  ALLERGIES: Patient has no known allergies.  MEDICATIONS:  Current Outpatient Medications  Medication Sig Dispense Refill   feeding supplement (ENSURE ENLIVE / ENSURE  PLUS) LIQD Take 237 mLs by mouth 3 (three) times daily between meals. 237 mL 12   furosemide  (LASIX ) 40 MG tablet TAKE 1 TABLET BY MOUTH EVERY DAY 90 tablet 1   HYDROcodone -acetaminophen  (NORCO/VICODIN) 5-325 MG tablet Take 1 tablet by mouth every 6 (six) hours as needed for moderate pain (pain score 4-6). 90 tablet 0   ipratropium-albuterol  (DUONEB) 0.5-2.5 (3) MG/3ML SOLN Take 3 mLs by nebulization every 6 (six) hours as needed. 300 mL 11   ondansetron  (ZOFRAN ) 8 MG tablet ONE PILL EVERY 8 HOURS AS NEEDED FOR NAUSEA/VOMITTING. 40 tablet 1   OXYGEN  Inhale into the lungs. 2 Liters 24/7     pantoprazole  (PROTONIX ) 20 MG tablet TAKE 2 TABLETS (40 MG TOTAL) BY MOUTH EVERY MORNING. TAKE 30 MINUTES TO HOUR BEFORE BREAKFAST 180 tablet 1   potassium chloride  (KLOR-CON  M) 10 MEQ tablet TAKE 1 TABLET BY MOUTH EVERY DAY 90 tablet 1   prochlorperazine  (COMPAZINE ) 10 MG tablet Take 1 tablet (10 mg total) by mouth every 6 (six) hours as needed for nausea or vomiting. 30 tablet 0   carvedilol  (COREG ) 3.125 MG tablet TAKE 1 TABLET BY MOUTH TWICE A DAY (Patient not taking: Reported on 06/23/2024) 180 tablet 3   hydroxychloroquine  (PLAQUENIL ) 200 MG tablet Take 1 tablet by mouth 2 (two) times daily. (Patient not taking: Reported on 06/23/2024)     losartan  (COZAAR ) 25 MG tablet TAKE 1/2 TABLET BY MOUTH EVERY DAY (Patient not taking: Reported on 06/23/2024) 45 tablet 0   MEGARED OMEGA-3 KRILL OIL PO Take 1 tablet by mouth daily. (Patient not taking: Reported on 06/23/2024)     Multiple Vitamins-Minerals (CENTRUM SILVER 50+WOMEN) TABS Take 1 tablet by mouth daily. (Patient not taking: Reported on 06/23/2024)     rosuvastatin  (CRESTOR ) 5 MG tablet TAKE 1 TABLET (5 MG TOTAL) BY MOUTH DAILY. (Patient not taking: Reported on 06/23/2024) 90 tablet 3   TRELEGY ELLIPTA  100-62.5-25 MCG/ACT AEPB Inhale 1 puff into the lungs daily.     No current facility-administered medications for this encounter.   Facility-Administered  Medications Ordered in Other Encounters  Medication Dose Route Frequency Provider Last Rate Last Admin   heparin  lock flush 100 unit/mL  500 Units Intravenous Once Brahmanday, Govinda R, MD       sodium chloride  flush (NS) 0.9 % injection 10 mL  10 mL Intravenous Once Brahmanday, Govinda R, MD       sodium chloride  flush (NS) 0.9 % injection 10 mL  10 mL Intravenous Once Brahmanday, Govinda R, MD        ECOG PERFORMANCE STATUS:  1 - Symptomatic but completely ambulatory  REVIEW OF SYSTEMS: Patient denies any weight loss, fatigue, weakness, fever, chills or night sweats. Patient denies any loss of vision, blurred vision. Patient denies any ringing  of the ears or hearing loss. No irregular heartbeat. Patient denies heart murmur or history of fainting. Patient denies any chest pain or pain radiating to her upper extremities. Patient denies any shortness of breath, difficulty breathing at night, cough or hemoptysis. Patient denies any swelling in the lower legs. Patient denies any nausea vomiting, vomiting of blood, or coffee ground material in the vomitus. Patient denies any stomach pain. Patient states has had normal bowel movements no significant constipation or diarrhea. Patient denies any dysuria, hematuria or significant nocturia. Patient denies any problems walking, swelling in the joints or loss of balance.  Patient denies any skin changes, loss of hair or loss of weight. Patient denies any excessive worrying or anxiety or significant depression. Patient denies any problems with insomnia. Patient denies excessive thirst, polyuria, polydipsia. Patient denies any swollen glands, patient denies easy bruising or easy bleeding. Patient denies any recent infections, allergies or URI. Patient s visual fields have not changed significantly in recent time.   PHYSICAL EXAM: BP 104/72   Pulse 81   Temp 97.7 F (36.5 C) (Tympanic)   Resp 16   Wt 198 lb (89.8 kg) Comment: stated wt  BMI 31.01 kg/m   Well-developed well-nourished patient in NAD. HEENT reveals PERLA, EOMI, discs not visualized.  Oral cavity is clear. No oral mucosal lesions are identified. Neck is clear without evidence of cervical or supraclavicular adenopathy. Lungs are clear to A&P. Cardiac examination is essentially unremarkable with regular rate and rhythm without murmur rub or thrill. Abdomen is benign with no organomegaly or masses noted. Motor sensory and DTR levels are equal and symmetric in the upper and lower extremities. Cranial nerves II through XII are grossly intact. Proprioception is intact. No peripheral adenopathy or edema is identified. No motor or sensory levels are noted. Crude visual fields are within normal range.  LABORATORY DATA: Pathology cytology reports reviewed    RADIOLOGY RESULTS: PET scan ordered previous PET scan and MRI scans reviewed compatible with above-stated findings   IMPRESSION: Probably locally advanced adeno adenocarcinoma the pancreas in 84 year old female causing a significant pain  PLAN: At this time elected another PET CT scan.  Should this show progression of disease in the pancreatic bed I would favor radiation therapy up to 50 Gray over 5 weeks using IMRT treatment planning and delivery.  I think this would certainly help her pain and control the disease is much as possible.  I believe with patient's comorbidities she will not be a surgical candidate in the future.  Patient's age cardiomyopathy and other medical issues including her mantle cell lymphoma would preclude surgical intervention.  I do deferred to [facility] for that.  I will see the patient in follow-up after her PET CT scan and further discuss possible palliative radiation therapy.  I would like to take this opportunity to thank you for allowing me to participate in the care of your patient.SABRA Marcey Penton, MD         "

## 2024-06-28 ENCOUNTER — Encounter

## 2024-06-29 ENCOUNTER — Inpatient Hospital Stay: Admitting: Internal Medicine

## 2024-06-29 ENCOUNTER — Encounter: Payer: Self-pay | Admitting: Internal Medicine

## 2024-06-29 ENCOUNTER — Telehealth: Payer: Self-pay | Admitting: Internal Medicine

## 2024-06-29 ENCOUNTER — Inpatient Hospital Stay

## 2024-06-29 NOTE — Telephone Encounter (Signed)
 Patient called and said she is not going to be able to come to her treatment tomorrow. She lives in [place] and said her driveway is still unsafe to drive on. I told her since this is chemo, I will have to check with the team. I told her we would call her back after we hear back. Please advise

## 2024-06-30 ENCOUNTER — Inpatient Hospital Stay

## 2024-06-30 ENCOUNTER — Inpatient Hospital Stay: Admitting: Internal Medicine

## 2024-06-30 ENCOUNTER — Encounter: Payer: Self-pay | Admitting: Internal Medicine

## 2024-06-30 VITALS — BP 111/72 | HR 79 | Temp 98.3°F | Resp 24 | Ht 67.0 in | Wt 206.7 lb

## 2024-06-30 VITALS — BP 96/61

## 2024-06-30 DIAGNOSIS — C8311 Mantle cell lymphoma, lymph nodes of head, face, and neck: Secondary | ICD-10-CM

## 2024-06-30 DIAGNOSIS — C250 Malignant neoplasm of head of pancreas: Secondary | ICD-10-CM

## 2024-06-30 DIAGNOSIS — Z5111 Encounter for antineoplastic chemotherapy: Secondary | ICD-10-CM | POA: Diagnosis not present

## 2024-06-30 LAB — CMP (CANCER CENTER ONLY)
ALT: 40 U/L (ref 0–44)
AST: 24 U/L (ref 15–41)
Albumin: 3.7 g/dL (ref 3.5–5.0)
Alkaline Phosphatase: 99 U/L (ref 38–126)
Anion gap: 11 (ref 5–15)
BUN: 16 mg/dL (ref 8–23)
CO2: 26 mmol/L (ref 22–32)
Calcium: 9.8 mg/dL (ref 8.9–10.3)
Chloride: 104 mmol/L (ref 98–111)
Creatinine: 0.66 mg/dL (ref 0.44–1.00)
GFR, Estimated: >60 mL/min
Glucose, Bld: 123 mg/dL — ABNORMAL HIGH (ref 70–99)
Potassium: 4.6 mmol/L (ref 3.5–5.1)
Sodium: 141 mmol/L (ref 135–145)
Total Bilirubin: 0.3 mg/dL (ref 0.0–1.2)
Total Protein: 6.1 g/dL — ABNORMAL LOW (ref 6.5–8.1)

## 2024-06-30 LAB — CBC WITH DIFFERENTIAL (CANCER CENTER ONLY)
Abs Immature Granulocytes: 0.14 10*3/uL — ABNORMAL HIGH (ref 0.00–0.07)
Basophils Absolute: 0 10*3/uL (ref 0.0–0.1)
Basophils Relative: 0 %
Eosinophils Absolute: 0.1 10*3/uL (ref 0.0–0.5)
Eosinophils Relative: 1 %
HCT: 38.3 % (ref 36.0–46.0)
Hemoglobin: 12.1 g/dL (ref 12.0–15.0)
Immature Granulocytes: 1 %
Lymphocytes Relative: 10 %
Lymphs Abs: 1 10*3/uL (ref 0.7–4.0)
MCH: 31 pg (ref 26.0–34.0)
MCHC: 31.6 g/dL (ref 30.0–36.0)
MCV: 98.2 fL (ref 80.0–100.0)
Monocytes Absolute: 0.4 10*3/uL (ref 0.1–1.0)
Monocytes Relative: 4 %
Neutro Abs: 8.4 10*3/uL — ABNORMAL HIGH (ref 1.7–7.7)
Neutrophils Relative %: 84 %
Platelet Count: 249 10*3/uL (ref 150–400)
RBC: 3.9 MIL/uL (ref 3.87–5.11)
RDW: 15.5 % (ref 11.5–15.5)
WBC Count: 10 10*3/uL (ref 4.0–10.5)
nRBC: 0 % (ref 0.0–0.2)

## 2024-06-30 MED ORDER — SODIUM CHLORIDE 0.9 % IV SOLN
INTRAVENOUS | Status: DC
  Filled 2024-06-30: qty 250

## 2024-06-30 MED ORDER — SODIUM CHLORIDE 0.9 % IV SOLN
1000.0000 mg/m2 | Freq: Once | INTRAVENOUS | Status: AC
  Administered 2024-06-30: 2052 mg via INTRAVENOUS
  Filled 2024-06-30: qty 53.97

## 2024-06-30 MED ORDER — PACLITAXEL PROTEIN-BOUND CHEMO INJECTION 100 MG
125.0000 mg/m2 | Freq: Once | INTRAVENOUS | Status: AC
  Administered 2024-06-30: 260 mg via INTRAVENOUS
  Filled 2024-06-30: qty 52

## 2024-06-30 MED ORDER — PROCHLORPERAZINE MALEATE 10 MG PO TABS
10.0000 mg | ORAL_TABLET | Freq: Once | ORAL | Status: AC
  Administered 2024-06-30: 10 mg via ORAL
  Filled 2024-06-30: qty 1

## 2024-06-30 NOTE — Patient Instructions (Signed)
 [facility] - A DEPT OF [facility]  Discharge Instructions: Thank you for choosing [facility] to provide your oncology and hematology care.  If you have a lab appointment with the Cancer Center, please go directly to the Cancer Center and check in at the registration area.  Wear comfortable clothing and clothing appropriate for easy access to any Portacath or PICC line.   We strive to give you quality time with your provider. You may need to reschedule your appointment if you arrive late (15 or more minutes).  Arriving late affects you and other patients whose appointments are after yours.  Also, if you miss three or more appointments without notifying the office, you may be dismissed from the clinic at the provider's discretion.      For prescription refill requests, have your pharmacy contact our office and allow 72 hours for refills to be completed.    Today you received the following chemotherapy and/or immunotherapy agents GEMZAR  and ABRAXENE      To help prevent nausea and vomiting after your treatment, we encourage you to take your nausea medication as directed.  BELOW ARE SYMPTOMS THAT SHOULD BE REPORTED IMMEDIATELY: *FEVER GREATER THAN 100.4 F (38 C) OR HIGHER *CHILLS OR SWEATING *NAUSEA AND VOMITING THAT IS NOT CONTROLLED WITH YOUR NAUSEA MEDICATION *UNUSUAL SHORTNESS OF BREATH *UNUSUAL BRUISING OR BLEEDING *URINARY PROBLEMS (pain or burning when urinating, or frequent urination) *BOWEL PROBLEMS (unusual diarrhea, constipation, pain near the anus) TENDERNESS IN MOUTH AND THROAT WITH OR WITHOUT PRESENCE OF ULCERS (sore throat, sores in mouth, or a toothache) UNUSUAL RASH, SWELLING OR PAIN  UNUSUAL VAGINAL DISCHARGE OR ITCHING   Items with * indicate a potential emergency and should be followed up as soon as possible or go to the Emergency Department if any problems should occur.  Please show the CHEMOTHERAPY ALERT CARD or  IMMUNOTHERAPY ALERT CARD at check-in to the Emergency Department and triage nurse.  Should you have questions after your visit or need to cancel or reschedule your appointment, please contact [facility] - A DEPT OF [facility]  [phone] and follow the prompts.  Office hours are 8:00 a.m. to 4:30 p.m. Monday - Friday. Please note that voicemails left after 4:00 p.m. may not be returned until the following business day.  We are closed weekends and major holidays. You have access to a nurse at all times for urgent questions. Please call the main number to the clinic [phone] and follow the prompts.  For any non-urgent questions, you may also contact your provider using MyChart. We now offer e-Visits for anyone 67 and older to request care online for non-urgent symptoms. For details visit [email or web address].   Also download the MyChart app! Go to the app store, search MyChart, open the app, select [facility], and log in with your MyChart username and password.  Gemcitabine  Injection What is this medication? GEMCITABINE  (jem SYE ta been) treats some types of cancer. It works by slowing down the growth of cancer cells. This medicine may be used for other purposes; ask your health care provider or pharmacist if you have questions. COMMON BRAND NAME(S): Gemzar , Infugem  What should I tell my care team before I take this medication? They need to know if you have any of these conditions: Blood disorders Infection Kidney disease Liver disease Lung or breathing disease, such as asthma or COPD Recent or ongoing radiation therapy An unusual  or allergic reaction to gemcitabine , other medications, foods, dyes, or preservatives If you or your partner are pregnant or trying to get pregnant Breast-feeding How should I use this medication? This medication is injected into a vein. It is given by your care team in a hospital or clinic setting. Talk to your care  team about the use of this medication in children. Special care may be needed. Overdosage: If you think you have taken too much of this medicine contact a poison control center or emergency room at once. NOTE: This medicine is only for you. Do not share this medicine with others. What if I miss a dose? Keep appointments for follow-up doses. It is important not to miss your dose. Call your care team if you are unable to keep an appointment. What may interact with this medication? Interactions have not been studied. This list may not describe all possible interactions. Give your health care provider a list of all the medicines, herbs, non-prescription drugs, or dietary supplements you use. Also tell them if you smoke, drink alcohol, or use illegal drugs. Some items may interact with your medicine. What should I watch for while using this medication? Your condition will be monitored carefully while you are receiving this medication. This medication may make you feel generally unwell. This is not uncommon, as chemotherapy can affect healthy cells as well as cancer cells. Report any side effects. Continue your course of treatment even though you feel ill unless your care team tells you to stop. In some cases, you may be given additional medications to help with side effects. Follow all directions for their use. This medication may increase your risk of getting an infection. Call your care team for advice if you get a fever, chills, sore throat, or other symptoms of a cold or flu. Do not treat yourself. Try to avoid being around people who are sick. This medication may increase your risk to bruise or bleed. Call your care team if you notice any unusual bleeding. Be careful brushing or flossing your teeth or using a toothpick because you may get an infection or bleed more easily. If you have any dental work done, tell your dentist you are receiving this medication. Avoid taking medications that contain  aspirin, acetaminophen , ibuprofen, naproxen, or ketoprofen unless instructed by your care team. These medications may hide a fever. Talk to your care team if you or your partner wish to become pregnant or think you might be pregnant. This medication can cause serious birth defects if taken during pregnancy and for 6 months after the last dose. A negative pregnancy test is required before starting this medication. A reliable form of contraception is recommended while taking this medication and for 6 months after the last dose. Talk to your care team about effective forms of contraception. Do not father a child while taking this medication and for 3 months after the last dose. Use a condom while having sex during this time period. Do not breastfeed while taking this medication and for at least 1 week after the last dose. This medication may cause infertility. Talk to your care team if you are concerned about your fertility. What side effects may I notice from receiving this medication? Side effects that you should report to your care team as soon as possible: Allergic reactions--skin rash, itching, hives, swelling of the face, lips, tongue, or throat Capillary leak syndrome--stomach or muscle pain, unusual weakness or fatigue, feeling faint or lightheaded, decrease in the amount of urine,  swelling of the ankles, hands, or feet, trouble breathing Infection--fever, chills, cough, sore throat, wounds that don't heal, pain or trouble when passing urine, general feeling of discomfort or being unwell Liver injury--right upper belly pain, loss of appetite, nausea, light-colored stool, dark yellow or brown urine, yellowing skin or eyes, unusual weakness or fatigue Low red blood cell level--unusual weakness or fatigue, dizziness, headache, trouble breathing Lung injury--shortness of breath or trouble breathing, cough, spitting up blood, chest pain, fever Stomach pain, bloody diarrhea, pale skin, unusual weakness or  fatigue, decrease in the amount of urine, which may be signs of hemolytic uremic syndrome Sudden and severe headache, confusion, change in vision, seizures, which may be signs of posterior reversible encephalopathy syndrome (PRES) Unusual bruising or bleeding Side effects that usually do not require medical attention (report to your care team if they continue or are bothersome): Diarrhea Drowsiness Hair loss Nausea Pain, redness, or swelling with sores inside the mouth or throat Vomiting This list may not describe all possible side effects. Call your doctor for medical advice about side effects. You may report side effects to FDA at 1-800-FDA-1088. Where should I keep my medication? This medication is given in a hospital or clinic. It will not be stored at home. NOTE: This sheet is a summary. It may not cover all possible information. If you have questions about this medicine, talk to your doctor, pharmacist, or health care provider.  2024 Elsevier/Gold Standard (2021-09-24 00:00:00)  Paclitaxel  Nanoparticle Albumin-Bound Injection What is this medication? NANOPARTICLE ALBUMIN-BOUND PACLITAXEL  (Na no PAHR ti kuhl al BYOO muhn-bound PAK li TAX el) treats some types of cancer. It works by slowing down the growth of cancer cells. This medicine may be used for other purposes; ask your health care provider or pharmacist if you have questions. COMMON BRAND NAME(S): Abraxane  What should I tell my care team before I take this medication? They need to know if you have any of these conditions: Liver disease Low white blood cell levels An unusual or allergic reaction to paclitaxel , albumin, other medications, foods, dyes, or preservatives If you or your partner are pregnant or trying to get pregnant Breast-feeding How should I use this medication? This medication is injected into a vein. It is given by your care team in a hospital or clinic setting. Talk to your care team about the use of this  medication in children. Special care may be needed. Overdosage: If you think you have taken too much of this medicine contact a poison control center or emergency room at once. NOTE: This medicine is only for you. Do not share this medicine with others. What if I miss a dose? Keep appointments for follow-up doses. It is important not to miss your dose. Call your care team if you are unable to keep an appointment. What may interact with this medication? Other medications may affect the way this medication works. Talk with your care team about all of the medications you take. They may suggest changes to your treatment plan to lower the risk of side effects and to make sure your medications work as intended. This list may not describe all possible interactions. Give your health care provider a list of all the medicines, herbs, non-prescription drugs, or dietary supplements you use. Also tell them if you smoke, drink alcohol, or use illegal drugs. Some items may interact with your medicine. What should I watch for while using this medication? Your condition will be monitored carefully while you are receiving this medication. You  may need blood work while taking this medication. This medication may make you feel generally unwell. This is not uncommon as chemotherapy can affect healthy cells as well as cancer cells. Report any side effects. Continue your course of treatment even though you feel ill unless your care team tells you to stop. This medication can cause serious allergic reactions. To reduce the risk, your care team may give you other medications to take before receiving this one. Be sure to follow the directions from your care team. This medication may increase your risk of getting an infection. Call your care team for advice if you get a fever, chills, sore throat, or other symptoms of a cold or flu. Do not treat yourself. Try to avoid being around people who are sick. This medication may increase  your risk to bruise or bleed. Call your care team if you notice any unusual bleeding. Be careful brushing or flossing your teeth or using a toothpick because you may get an infection or bleed more easily. If you have any dental work done, tell your dentist you are receiving this medication. Talk to your care team if you or your partner may be pregnant. Serious birth defects can occur if you take this medication during pregnancy and for 6 months after the last dose. You will need a negative pregnancy test before starting this medication. Contraception is recommended while taking this medication and for 6 months after the last dose. Your care team can help you find the option that works for you. If your partner can get pregnant, use a condom during sex while taking this medication and for 3 months after the last dose. Do not breastfeed while taking this medication and for 2 weeks after the last dose. This medication may cause infertility. Talk to your care team if you are concerned about your fertility. What side effects may I notice from receiving this medication? Side effects that you should report to your care team as soon as possible: Allergic reactions--skin rash, itching, hives, swelling of the face, lips, tongue, or throat Dry cough, shortness of breath or trouble breathing Infection--fever, chills, cough, sore throat, wounds that don't heal, pain or trouble when passing urine, general feeling of discomfort or being unwell Low red blood cell level--unusual weakness or fatigue, dizziness, headache, trouble breathing Pain, tingling, or numbness in the hands or feet Stomach pain, unusual weakness or fatigue, nausea, vomiting, diarrhea, or fever that lasts longer than expected Unusual bruising or bleeding Side effects that usually do not require medical attention (report to your care team if they continue or are bothersome): Diarrhea Fatigue Hair loss Loss of appetite Nausea Vomiting This list  may not describe all possible side effects. Call your doctor for medical advice about side effects. You may report side effects to FDA at 1-800-FDA-1088. Where should I keep my medication? This medication is given in a hospital or clinic. It will not be stored at home. NOTE: This sheet is a summary. It may not cover all possible information. If you have questions about this medicine, talk to your doctor, pharmacist, or health care provider.  2024 Elsevier/Gold Standard (2021-10-03 00:00:00)

## 2024-06-30 NOTE — Progress Notes (Signed)
 Nutrition Follow-up:  Patient with pancreatic cancer borderline candidate for surgery.  Patient planning abraxane  and gemcitabine .    Met with patient during infusion.  Reports that her appetite is better and abdominal pain is better.  Had an episode of nausea and pain following eating treet meat.  Yesterday was able to eat 2 boiled eggs, crackers, yogurt and chocolate shake (ensure plus).  Did not have anything for lunch.  Supper was cheese sandwich and pepsi.  Takes dulcolax with coffee to help with bowel movements.      Medications: reviewed  Labs: reviewed  Anthropometrics:   Weight 206 lb today  201 lb on 1/8 225 lb on 01/11/24   NUTRITION DIAGNOSIS: Inadequate oral intake improving    INTERVENTION:  Continue ensure plus shake for added nutrition Encouraged foods rich in protein Discussed foods that may cause stomach upset.   Continue bowel regimen to prevent constipation    MONITORING, EVALUATION, GOAL: weight trends, intake   NEXT VISIT: Thursday, Feb 26 during infusion  Supreme Rybarczyk B. Dasie SOLON, CSO, LDN Registered Dietitian [phone]

## 2024-06-30 NOTE — Assessment & Plan Note (Addendum)
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  NOV-DEC 2025- worsening abdominal pain-  ill-defined T1 hypointense approximately 2.0 x 2.7 cm (series 18, image 55), lesion centered in the uncinate process/pancreatic head#  # NOV 2025- abdominal pain- ? 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 Short-term follow-up examination is recommended in 3 months to document resolution; There is a 4 x 8 mm loculated T2 hyperintense non-aggressive lesion in the pancreatic body.  Elevated tumor markers-CA 19-9.  Plan checking NGS if tissue available.  Status post tumor conference review discussion. Dr.Nussbaum-surgical evaluation at [facility]; [June 17, 2024]. .  # proceed chemotherapy- cycle #2- gemcitabine  Abraxane  every 2 weeks. With GCSF support- Labs-CBC/chemistries were reviewed with the patient.  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 PET scan FEB 2026-pending.   # Immunocompromised state: Hx Multifocal pneumonia-[April 2023]-Given the multiple pneumonias/high risk of death-recommended continuation of further rituximab . MARCH 2024- IgG- 800.  Due to monitor closely while on chemotherapy.  # Abdominal pain-likely secondary pancreatic cancer.  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  Patient unable to refill her pain prescriptions-reminded the prescription has been sent to pharmacy last week.  To call and recheck on the prescription.  Will give morphine  2 mg for acute pain control.  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   # Hx of Rheumatoid arthritis-given ILD-  s/p evaluation with  Rheumatology- stable.   # Left shoulder pain/ Chronic arthritis-on meloxicam  as needed-  stable.  #Vaccinations :recommend  flu shot-   COVID booster/RSV; ? Pneumonia vaccination [check with PCP]  # PIV:   # ACP- done.  Discussed with patient's niece Kenneth over the phone regarding the medications  PS- 2 cycles- each time # DISPOSITION:  # copy of med list # chemo today;  #  injection tomorrow- #  MD-q 2 weeks- labs- cbc/cmp;ca-19-9; chemo-D-2 injection-  Dr.B

## 2024-06-30 NOTE — Progress Notes (Signed)
 [facility] OFFICE PROGRESS NOTE  Patient Care Team: Dineen Rollene MATSU, FNP as PCP - General (Family Medicine) End, Lonni, MD as PCP - Cardiology (Cardiology) Fernande Elspeth BROCKS, MD (Inactive) as PCP - Electrophysiology (Cardiology) [name], MD (Internal Medicine) Rennie Michelle SAUNDERS, MD as Consulting Physician (Oncology) Lenn Aran, MD as Consulting Physician (Radiation Oncology) Lenn Aran, MD as Consulting Physician (Radiation Oncology)   Cancer Staging  Primary cancer of head of pancreas [facility]) Staging form: Exocrine Pancreas, AJCC 8th Edition - Clinical: Stage IB (cT2, cN0, cM0) - Signed by Rennie Michelle SAUNDERS, MD on 06/07/2024 Total positive nodes: 0    Oncology History Overview Note  # JAN 2017- MANTLE CELL LYMPHOMA STAGE IV; [R Breast LN US  Core Bx-1.2cm LN/R Ax LN-Bx]; cyclin D Pos; Mitotic rate-LOW; MIPI score [5/intermediate risk]; BMBx-Positive for involvement. Feb 9th- START Benda-Ritux with neulasta ; Prolonged neutropenia; DISCONT- Benda-Ritux;   # April 13 th 2017- START R-CHOP x1; severe/prolonged neutropenia; PET- CR; BMBx-Neg; Disc R-CHOP # June 2022- DIAGNOSIS: thickened cortex of 12 mm. An additional lymph node demonstrates a thickened cortex of 7 mm. A third lymph node demonstrates a mildly thickened cortex of 4 mm A. LYMPH NODE, LEFT AXILLA; ULTRASOUND-GUIDED BIOPSY:  - CD5+ MONOCLONAL B-CELL POPULATION; COMPATIBLE WITH INVOLVEMENT BY THE  PATIENT'S KNOWN MANTLE CELL LYMPHOMA.   #July 2022-second week-started ibrutinib  420 mg a dayx 1 week-stop because of severe rash.  Significant clinical response noted.  # # 26th MAY 2017- Start Rituxan  q 78M Main OCT 12th 2017- PET NED.    # AUG 1st, 2022- start acalbrutinib. 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, face, and neck (HCC)  02/22/2021 - 07/12/2021 Chemotherapy   Patient is on Treatment Plan : Rituximab  q 4 W     06/16/2024 -  Chemotherapy   Patient is on Treatment Plan : PANCREATIC Abraxane  D1,8,15 + Gemcitabine  D1,8,15 q28d     Primary cancer of head of pancreas (HCC)  06/07/2024 Initial Diagnosis   Primary cancer of head of pancreas (HCC)   06/07/2024 Cancer Staging   Staging form: Exocrine Pancreas, AJCC 8th Edition - Clinical: Stage IB (cT2, cN0, cM0) - Signed by Rennie Michelle SAUNDERS, MD on 06/07/2024 Total positive nodes: 0   06/16/2024 -  Chemotherapy   Patient is on Treatment Plan : PANCREATIC Abraxane  D1,8,15 + Gemcitabine  D1,8,15 q28d      INTERVAL HISTORY: Accompanied by her brother ambulating wheelchair.   Michelle [name] 84 y.o.  female pleasant patient multiple medical problems-ischemic cardiomyopathy; pulm hypertension; Hx of PE OFF eliquis  and above history of recurrent mantle cell lymphoma on Calquence - on HOLD; Pancreatic adenocarcinoma here for follow-up.  Discussed the use of AI scribe software for clinical note transcription with the patient, who gave verbal consent to proceed.   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  She is currently undergoing neoadjuvant chemotherapy for early stage pancreatic adenocarcinoma.  She reports marked improvement in pain, with complete resolution of pain symptoms and no current need for regular analgesics. Previously, she required pain medication for severe pain, but  now only uses it rarely. Her last episode of pain, associated with consumption of Spam, was accompanied by transient leg pain, ankle edema, and abdominal discomfort, all of which resolved after discontinuing Spam and taking a single dose of pain medication. No further pain episodes have occurred since then.  She denies ongoing adverse effects from chemotherapy, including nausea or vomiting, except for the isolated episode related to dietary indiscretion. She is scheduled for a PET scan as part of her ongoing oncologic evaluation.  She discontinued all chronic medications, including those for heart failure, hypertension, hyperlipidemia, and gastroesophageal reflux disease, during a period of severe pain and following instructions from various providers during recent emergency department visits for hypotension and other acute issues.      Review of Systems  Constitutional:  Negative for chills, diaphoresis, fever, malaise/fatigue and weight loss.  HENT:  Negative for nosebleeds and sore throat.   Eyes:  Negative for double vision.  Respiratory:  Negative for hemoptysis, sputum production and wheezing.   Cardiovascular:  Negative for chest pain, palpitations, orthopnea and leg swelling.  Gastrointestinal:  Positive for constipation. Negative for abdominal pain, blood in stool, diarrhea, heartburn, melena, nausea and vomiting.  Genitourinary:  Negative for dysuria, frequency and urgency.  Musculoskeletal:  Positive for back pain and joint pain.  Skin: Negative.  Negative for itching and rash.  Neurological:  Negative for dizziness, tingling, focal weakness, weakness and headaches.  Endo/Heme/Allergies:  Does not bruise/bleed easily.  Psychiatric/Behavioral:  Negative for depression. The patient is not nervous/anxious and does not have insomnia.     PAST MEDICAL HISTORY :  Past Medical History:  Diagnosis Date   Arthritis    Collagen vascular disease    GERD (gastroesophageal reflux disease)     Headache(784.0)    HFrEF (heart failure with reduced ejection fraction) (HCC)    Nonischemic cardiomyopathy, LVEF as low as 30-35% in 08/2019   History of methotrexate  therapy    Hyperlipidemia    hx   Lymphadenopathy of head and neck 01/2015   see on Thyroid  ultrasound   Lymphoma, mantle cell (HCC) 06/01/2015   bx of lymph node in right breast/Stage IV Mantle Cell Lymphoma   Multifocal pneumonia 10/05/2021   Personal history of chemotherapy    Pulmonary embolism (HCC) 10/05/2021   acute   Respiratory failure (HCC) 10/05/2021   acute   Rheumatoid arthritis (HCC)     PAST SURGICAL HISTORY :   Past Surgical History:  Procedure Laterality Date   BREAST BIOPSY Left 11/07/2020   u/s bx-hydromark #3 coil-path pending   CARDIAC CATHETERIZATION  09/2004   [facility]; EF 60%   CARDIAC CATHETERIZATION  08/2004   [facility]   IR FLUORO GUIDED NEEDLE PLC ASPIRATION/INJECTION LOC  06/18/2018   PERIPHERAL VASCULAR CATHETERIZATION N/A 07/04/2015   Procedure: Pat Cath Insertion;  Surgeon: Selinda GORMAN Gu, MD;  Location: [facility] INVASIVE CV LAB;  Service: Cardiovascular;  Laterality: N/A;   PORTA CATH REMOVAL N/A 06/23/2018   Procedure: PORTA CATH REMOVAL;  Surgeon: Gu Selinda GORMAN, MD;  Location: [facility] INVASIVE CV LAB;  Service: Cardiovascular;  Laterality: N/A;   RIGHT/LEFT HEART CATH AND CORONARY ANGIOGRAPHY Bilateral 09/20/2019   Procedure: RIGHT/LEFT HEART CATH AND CORONARY ANGIOGRAPHY;  Surgeon: Mady Bruckner, MD;  Location: [facility] INVASIVE CV LAB;  Service: Cardiovascular;  Laterality: Bilateral;    FAMILY  HISTORY :   Family History  Problem Relation Age of Onset   Diabetes Mother    Cholelithiasis Mother    Hypertension Sister    Diabetes Sister    Heart murmur Sister    Arthritis Brother    Breast cancer Neg Hx     SOCIAL HISTORY:   Social History   Tobacco Use   Smoking status: Never   Smokeless tobacco: Never  Vaping Use   Vaping status: Never Used  Substance Use Topics   Alcohol use: No    Drug use: No    ALLERGIES:  has no known allergies.  MEDICATIONS:  Current Outpatient Medications  Medication Sig Dispense Refill   feeding supplement (ENSURE ENLIVE / ENSURE PLUS) LIQD Take 237 mLs by mouth 3 (three) times daily between meals. 237 mL 12   HYDROcodone -acetaminophen  (NORCO/VICODIN) 5-325 MG tablet Take 1 tablet by mouth every 6 (six) hours as needed for moderate pain (pain score 4-6). 90 tablet 0   ipratropium-albuterol  (DUONEB) 0.5-2.5 (3) MG/3ML SOLN Take 3 mLs by nebulization every 6 (six) hours as needed. 300 mL 11   ondansetron  (ZOFRAN ) 8 MG tablet ONE PILL EVERY 8 HOURS AS NEEDED FOR NAUSEA/VOMITTING. 40 tablet 1   OXYGEN  Inhale into the lungs. 2 Liters 24/7     prochlorperazine  (COMPAZINE ) 10 MG tablet Take 1 tablet (10 mg total) by mouth every 6 (six) hours as needed for nausea or vomiting. 30 tablet 0   TRELEGY ELLIPTA  100-62.5-25 MCG/ACT AEPB Inhale 1 puff into the lungs daily.     carvedilol  (COREG ) 3.125 MG tablet TAKE 1 TABLET BY MOUTH TWICE A DAY (Patient not taking: Reported on 06/30/2024) 180 tablet 3   furosemide  (LASIX ) 40 MG tablet TAKE 1 TABLET BY MOUTH EVERY DAY (Patient not taking: Reported on 06/30/2024) 90 tablet 1   hydroxychloroquine  (PLAQUENIL ) 200 MG tablet Take 1 tablet by mouth 2 (two) times daily. (Patient not taking: Reported on 06/30/2024)     losartan  (COZAAR ) 25 MG tablet TAKE 1/2 TABLET BY MOUTH EVERY DAY (Patient not taking: Reported on 06/30/2024) 45 tablet 0   MEGARED OMEGA-3 KRILL OIL PO Take 1 tablet by mouth daily. (Patient not taking: Reported on 06/30/2024)     Multiple Vitamins-Minerals (CENTRUM SILVER 50+WOMEN) TABS Take 1 tablet by mouth daily. (Patient not taking: Reported on 06/30/2024)     pantoprazole  (PROTONIX ) 20 MG tablet TAKE 2 TABLETS (40 MG TOTAL) BY MOUTH EVERY MORNING. TAKE 30 MINUTES TO HOUR BEFORE BREAKFAST (Patient not taking: Reported on 06/30/2024) 180 tablet 1   potassium chloride  (KLOR-CON  M) 10 MEQ tablet TAKE 1  TABLET BY MOUTH EVERY DAY (Patient not taking: Reported on 06/30/2024) 90 tablet 1   rosuvastatin  (CRESTOR ) 5 MG tablet TAKE 1 TABLET (5 MG TOTAL) BY MOUTH DAILY. (Patient not taking: Reported on 06/30/2024) 90 tablet 3   No current facility-administered medications for this visit.   Facility-Administered Medications Ordered in Other Visits  Medication Dose Route Frequency Provider Last Rate Last Admin   0.9 %  sodium chloride  infusion   Intravenous Continuous Imir Brumbach R, MD 10 mL/hr at 06/30/24 1022 New Bag at 06/30/24 1022   gemcitabine  (GEMZAR ) 2,052 mg in sodium chloride  0.9 % 250 mL chemo infusion  1,000 mg/m2 (Treatment Plan Recorded) Intravenous Once Armanii Pressnell R, MD       heparin  lock flush 100 unit/mL  500 Units Intravenous Once Seymone Forlenza R, MD       PACLitaxel -protein bound (ABRAXANE ) chemo infusion 260 mg  125 mg/m2 (Treatment Plan Recorded) Intravenous Once Eusevio Schriver R, MD       sodium chloride  flush (NS) 0.9 % injection 10 mL  10 mL Intravenous Once Liana Camerer R, MD       sodium chloride  flush (NS) 0.9 % injection 10 mL  10 mL Intravenous Once Aliscia Clayton R, MD        PHYSICAL EXAMINATION: ECOG PERFORMANCE STATUS: 0 - Asymptomatic  BP 111/72 (BP Location: Left Arm, Patient Position: Sitting, Cuff Size: Large)   Pulse 79   Temp 98.3 F (36.8 C) (Tympanic)   Resp (!) 24   Ht 5' 7 (1.702 m)   Wt 206 lb 11.2 oz (93.8 kg)   SpO2 100%   BMI 32.37 kg/m   Filed Weights   06/30/24 0912  Weight: 206 lb 11.2 oz (93.8 kg)        Physical Exam HENT:     Head: Normocephalic and atraumatic.     Mouth/Throat:     Pharynx: No oropharyngeal exudate.  Eyes:     Pupils: Pupils are equal, round, and reactive to light.  Cardiovascular:     Rate and Rhythm: Normal rate and regular rhythm.  Pulmonary:     Effort: No respiratory distress.     Breath sounds: No wheezing.     Comments: Bilateral basilar crackles Abdominal:      General: Bowel sounds are normal. There is no distension.     Palpations: Abdomen is soft. There is no mass.     Tenderness: There is no abdominal tenderness. There is no guarding or rebound.  Musculoskeletal:        General: No tenderness. Normal range of motion.     Cervical back: Normal range of motion and neck supple.  Skin:    General: Skin is warm.  Neurological:     Mental Status: She is alert and oriented to person, place, and time.  Psychiatric:        Mood and Affect: Affect normal.    LABORATORY DATA:  I have reviewed the data as listed    Component Value Date/Time   NA 141 06/30/2024 0900   NA 145 (H) 09/08/2019 1359   NA 142 09/14/2013 1127   K 4.6 06/30/2024 0900   K 3.5 09/14/2013 1127   CL 104 06/30/2024 0900   CL 110 (H) 09/14/2013 1127   CO2 26 06/30/2024 0900   CO2 29 09/14/2013 1127   GLUCOSE 123 (H) 06/30/2024 0900   GLUCOSE 106 (H) 09/14/2013 1127   BUN 16 06/30/2024 0900   BUN 15 09/08/2019 1359   BUN 8 09/14/2013 1127   CREATININE 0.66 06/30/2024 0900   CREATININE 0.71 09/14/2013 1127   CREATININE 0.68 04/01/2013 1550   CALCIUM  9.8 06/30/2024 0900   CALCIUM  8.6 09/14/2013 1127   PROT 6.1 (L) 06/30/2024 0900   PROT 7.6 09/14/2013 1127   ALBUMIN 3.7 06/30/2024 0900   ALBUMIN 3.2 (L) 09/14/2013 1127   AST 24 06/30/2024 0900   ALT 40 06/30/2024 0900   ALT 19 09/14/2013 1127   ALKPHOS 99 06/30/2024 0900   ALKPHOS 76 09/14/2013 1127   BILITOT 0.3 06/30/2024 0900   GFRNONAA >60 06/30/2024 0900   GFRNONAA >60 09/14/2013 1127   GFRAA >60 01/20/2020 1303   GFRAA >60 09/14/2013 1127    No results found for: SPEP, UPEP  Lab Results  Component Value Date   WBC 10.0 06/30/2024   NEUTROABS 8.4 (H) 06/30/2024   HGB 12.1 06/30/2024  HCT 38.3 06/30/2024   MCV 98.2 06/30/2024   PLT 249 06/30/2024      Chemistry      Component Value Date/Time   NA 141 06/30/2024 0900   NA 145 (H) 09/08/2019 1359   NA 142 09/14/2013 1127   K 4.6  06/30/2024 0900   K 3.5 09/14/2013 1127   CL 104 06/30/2024 0900   CL 110 (H) 09/14/2013 1127   CO2 26 06/30/2024 0900   CO2 29 09/14/2013 1127   BUN 16 06/30/2024 0900   BUN 15 09/08/2019 1359   BUN 8 09/14/2013 1127   CREATININE 0.66 06/30/2024 0900   CREATININE 0.71 09/14/2013 1127   CREATININE 0.68 04/01/2013 1550      Component Value Date/Time   CALCIUM  9.8 06/30/2024 0900   CALCIUM  8.6 09/14/2013 1127   ALKPHOS 99 06/30/2024 0900   ALKPHOS 76 09/14/2013 1127   AST 24 06/30/2024 0900   ALT 40 06/30/2024 0900   ALT 19 09/14/2013 1127   BILITOT 0.3 06/30/2024 0900       RADIOGRAPHIC STUDIES: I have personally reviewed the radiological images as listed and agreed with the findings in the report. No results found.   ASSESSMENT & PLAN:  Primary cancer of head of pancreas (HCC) #  stage IB-S/p endoscopic ultrasound  [Dr.Burbridge]- adenocarcinoma.  NOV-DEC 2025- worsening abdominal pain-  ill-defined T1 hypointense approximately 2.0 x 2.7 cm (series 18, image 55), lesion centered in the uncinate process/pancreatic head#  # NOV 2025- abdominal pain- ? 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 Short-term follow-up examination is recommended in 3 months to document resolution; There is a 4 x 8 mm loculated T2 hyperintense non-aggressive lesion in the pancreatic body.  Elevated tumor markers-CA 19-9.  Plan checking NGS if tissue available.  Status post tumor conference review discussion. Dr.Nussbaum-surgical evaluation at [facility]; [June 17, 2024]. .  # proceed chemotherapy- cycle #2- gemcitabine  Abraxane  every 2 weeks. With GCSF support- Labs-CBC/chemistries were reviewed with the patient.  # [id number]- Mantle cell lymphoma recurrent biopsy-proven [July 2022]; Currently  Calquence   [100 mg BID- since Aug 1st, 2025-stopped in dec 2025]on HOLD sec to recent diagnosis  of pancreatic cancer. PET scan FEB 2026-pending.   # Immunocompromised state: Hx Multifocal pneumonia-[April 2023]-Given the multiple pneumonias/high risk of death-recommended continuation of further rituximab . MARCH 2024- IgG- 800.  Due to monitor closely while on chemotherapy.  # Abdominal pain-likely secondary pancreatic cancer.  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  Patient unable to refill her pain prescriptions-reminded the prescription has been sent to pharmacy last week.  To call and recheck on the prescription.  Will give morphine  2 mg for acute pain control.  # History of hypokalemia-currently hyperkalemic potassium 5.4; recommend discontinuation of potassium supplement  # NICMP- [35-40%%- July 28th 2021]--  EVAL; NO CRT [Dr.End/Dr.Klein]July 2022- 2D echo 40 to 45% ejection fraction-  [sec to dizzy spells]; on coreg -re-start/coreg /cozaar - lasix  40 mg/day- as per Cards- stable.   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   # Hx of Rheumatoid arthritis-given ILD-  s/p evaluation with  Rheumatology- stable.   # Left shoulder pain/ Chronic arthritis-on meloxicam  as needed-  stable.  #Vaccinations :recommend  flu shot-   COVID  booster/RSV; ? Pneumonia vaccination [check with PCP]  # PIV:   # ACP- done.  Discussed with patient's niece Kenneth over the phone regarding the medications  PS- 2 cycles- each time # DISPOSITION:  # copy of med list # chemo today;  #  injection tomorrow- #  MD-q 2 weeks- labs- cbc/cmp;ca-19-9; chemo-D-2 injection-  Dr.B       Orders Placed This Encounter  Procedures   CBC with Differential (Cancer Center Only)    Standing Status:   Future    Expected  Date:   07/28/2024    Expiration Date:   07/28/2025   CMP (Cancer Center only)    Standing Status:   Future    Expected Date:   07/28/2024    Expiration Date:   07/28/2025     All questions were answered. The patient knows to call the clinic with any problems, questions or concerns.      Michelle JONELLE Joe, MD 06/30/2024 10:31 AM

## 2024-06-30 NOTE — Progress Notes (Signed)
 Pt has not been taking most of her meds, she would like to know what you want her to start taking?  Sch'd for a PET Monday.  She is asking if there is any way she can get her injection today after chemo instead of tomorrow?

## 2024-07-01 ENCOUNTER — Inpatient Hospital Stay

## 2024-07-01 DIAGNOSIS — Z5111 Encounter for antineoplastic chemotherapy: Secondary | ICD-10-CM | POA: Diagnosis not present

## 2024-07-01 DIAGNOSIS — C8311 Mantle cell lymphoma, lymph nodes of head, face, and neck: Secondary | ICD-10-CM

## 2024-07-01 DIAGNOSIS — C250 Malignant neoplasm of head of pancreas: Secondary | ICD-10-CM

## 2024-07-01 LAB — CANCER ANTIGEN 19-9: CA 19-9: 348 U/mL — ABNORMAL HIGH (ref 0–35)

## 2024-07-01 MED ORDER — PEGFILGRASTIM-CBQV 6 MG/0.6ML ~~LOC~~ SOSY
6.0000 mg | PREFILLED_SYRINGE | Freq: Once | SUBCUTANEOUS | Status: AC
  Administered 2024-07-01: 6 mg via SUBCUTANEOUS
  Filled 2024-07-01: qty 0.6

## 2024-07-04 ENCOUNTER — Ambulatory Visit

## 2024-07-06 ENCOUNTER — Encounter: Admission: RE | Admit: 2024-07-06

## 2024-07-06 DIAGNOSIS — C250 Malignant neoplasm of head of pancreas: Secondary | ICD-10-CM

## 2024-07-06 LAB — GLUCOSE, CAPILLARY: Glucose-Capillary: 90 mg/dL (ref 70–99)

## 2024-07-06 MED ORDER — FLUDEOXYGLUCOSE F - 18 (FDG) INJECTION
10.8400 | Freq: Once | INTRAVENOUS | Status: AC | PRN
  Administered 2024-07-06: 10.84 via INTRAVENOUS

## 2024-07-07 ENCOUNTER — Inpatient Hospital Stay: Attending: Internal Medicine | Admitting: Hospice and Palliative Medicine

## 2024-07-07 ENCOUNTER — Ambulatory Visit: Admitting: Radiation Oncology

## 2024-07-07 DIAGNOSIS — C8311 Mantle cell lymphoma, lymph nodes of head, face, and neck: Secondary | ICD-10-CM

## 2024-07-07 NOTE — Progress Notes (Signed)
Unable to reach patient or leave a voicemail.  Will reschedule.

## 2024-07-13 ENCOUNTER — Ambulatory Visit: Admitting: Radiation Oncology

## 2024-07-14 ENCOUNTER — Inpatient Hospital Stay: Admitting: Internal Medicine

## 2024-07-14 ENCOUNTER — Inpatient Hospital Stay

## 2024-07-28 ENCOUNTER — Inpatient Hospital Stay: Admitting: Hospice and Palliative Medicine

## 2024-07-28 ENCOUNTER — Inpatient Hospital Stay

## 2024-07-28 ENCOUNTER — Inpatient Hospital Stay: Admitting: Internal Medicine

## 2024-07-29 ENCOUNTER — Inpatient Hospital Stay

## 2024-12-12 ENCOUNTER — Ambulatory Visit
